# Patient Record
Sex: Male | Born: 1950 | Race: White | Hispanic: No | Marital: Married | Smoking: Former smoker
Health system: Southern US, Community
[De-identification: ages and names within clinical notes are randomized; demographics above are authoritative.]

## PROBLEM LIST (undated history)

## (undated) DIAGNOSIS — K219 Gastro-esophageal reflux disease without esophagitis: Secondary | ICD-10-CM

## (undated) DIAGNOSIS — Z8709 Personal history of other diseases of the respiratory system: Secondary | ICD-10-CM

## (undated) DIAGNOSIS — R0600 Dyspnea, unspecified: Secondary | ICD-10-CM

## (undated) DIAGNOSIS — F419 Anxiety disorder, unspecified: Secondary | ICD-10-CM

## (undated) DIAGNOSIS — T8859XA Other complications of anesthesia, initial encounter: Secondary | ICD-10-CM

## (undated) DIAGNOSIS — I10 Essential (primary) hypertension: Secondary | ICD-10-CM

## (undated) DIAGNOSIS — R519 Headache, unspecified: Secondary | ICD-10-CM

## (undated) DIAGNOSIS — F32A Depression, unspecified: Secondary | ICD-10-CM

## (undated) DIAGNOSIS — J45909 Unspecified asthma, uncomplicated: Secondary | ICD-10-CM

## (undated) DIAGNOSIS — Z952 Presence of prosthetic heart valve: Secondary | ICD-10-CM

## (undated) DIAGNOSIS — M797 Fibromyalgia: Secondary | ICD-10-CM

## (undated) DIAGNOSIS — D494 Neoplasm of unspecified behavior of bladder: Secondary | ICD-10-CM

## (undated) DIAGNOSIS — E039 Hypothyroidism, unspecified: Secondary | ICD-10-CM

## (undated) DIAGNOSIS — D4959 Neoplasm of unspecified behavior of other genitourinary organ: Secondary | ICD-10-CM

## (undated) DIAGNOSIS — Z8669 Personal history of other diseases of the nervous system and sense organs: Secondary | ICD-10-CM

## (undated) DIAGNOSIS — J189 Pneumonia, unspecified organism: Secondary | ICD-10-CM

## (undated) DIAGNOSIS — I480 Paroxysmal atrial fibrillation: Secondary | ICD-10-CM

## (undated) DIAGNOSIS — F329 Major depressive disorder, single episode, unspecified: Secondary | ICD-10-CM

## (undated) DIAGNOSIS — R51 Headache: Secondary | ICD-10-CM

## (undated) DIAGNOSIS — Z9989 Dependence on other enabling machines and devices: Secondary | ICD-10-CM

## (undated) DIAGNOSIS — I499 Cardiac arrhythmia, unspecified: Secondary | ICD-10-CM

## (undated) DIAGNOSIS — H53149 Visual discomfort, unspecified: Secondary | ICD-10-CM

## (undated) DIAGNOSIS — G4733 Obstructive sleep apnea (adult) (pediatric): Secondary | ICD-10-CM

## (undated) DIAGNOSIS — N400 Enlarged prostate without lower urinary tract symptoms: Secondary | ICD-10-CM

## (undated) DIAGNOSIS — J309 Allergic rhinitis, unspecified: Secondary | ICD-10-CM

## (undated) DIAGNOSIS — M1990 Unspecified osteoarthritis, unspecified site: Secondary | ICD-10-CM

## (undated) DIAGNOSIS — R5382 Chronic fatigue, unspecified: Secondary | ICD-10-CM

## (undated) DIAGNOSIS — I639 Cerebral infarction, unspecified: Secondary | ICD-10-CM

## (undated) HISTORY — DX: Presence of prosthetic heart valve: Z95.2

## (undated) HISTORY — DX: Essential (primary) hypertension: I10

## (undated) HISTORY — PX: COLONOSCOPY: SHX174

## (undated) HISTORY — PX: CARDIAC SURGERY: SHX584

## (undated) HISTORY — PX: WISDOM TOOTH EXTRACTION: SHX21

## (undated) SURGERY — Surgical Case
Anesthesia: *Unknown

---

## 1998-08-01 ENCOUNTER — Ambulatory Visit (HOSPITAL_COMMUNITY): Admission: RE | Admit: 1998-08-01 | Discharge: 1998-08-01 | Payer: Self-pay | Admitting: Specialist

## 1998-12-13 ENCOUNTER — Ambulatory Visit (HOSPITAL_COMMUNITY): Admission: RE | Admit: 1998-12-13 | Discharge: 1998-12-13 | Payer: Self-pay | Admitting: Family Medicine

## 1998-12-13 ENCOUNTER — Encounter: Payer: Self-pay | Admitting: Family Medicine

## 2000-03-06 ENCOUNTER — Encounter: Payer: Self-pay | Admitting: Chiropractic Medicine

## 2000-03-06 ENCOUNTER — Encounter: Admission: RE | Admit: 2000-03-06 | Discharge: 2000-03-06 | Payer: Self-pay | Admitting: Chiropractic Medicine

## 2001-01-01 ENCOUNTER — Encounter: Payer: Self-pay | Admitting: Family Medicine

## 2001-01-01 ENCOUNTER — Ambulatory Visit (HOSPITAL_COMMUNITY): Admission: RE | Admit: 2001-01-01 | Discharge: 2001-01-01 | Payer: Self-pay | Admitting: Family Medicine

## 2001-01-07 ENCOUNTER — Ambulatory Visit (HOSPITAL_COMMUNITY): Admission: RE | Admit: 2001-01-07 | Discharge: 2001-01-07 | Payer: Self-pay | Admitting: Gastroenterology

## 2001-01-12 ENCOUNTER — Encounter: Payer: Self-pay | Admitting: General Surgery

## 2001-01-14 ENCOUNTER — Encounter (INDEPENDENT_AMBULATORY_CARE_PROVIDER_SITE_OTHER): Payer: Self-pay | Admitting: Specialist

## 2001-01-14 ENCOUNTER — Encounter: Payer: Self-pay | Admitting: General Surgery

## 2001-01-14 ENCOUNTER — Observation Stay (HOSPITAL_COMMUNITY): Admission: RE | Admit: 2001-01-14 | Discharge: 2001-01-15 | Payer: Self-pay | Admitting: General Surgery

## 2001-01-14 HISTORY — PX: LAPAROSCOPIC CHOLECYSTECTOMY: SUR755

## 2001-04-27 ENCOUNTER — Ambulatory Visit (HOSPITAL_COMMUNITY): Admission: RE | Admit: 2001-04-27 | Discharge: 2001-04-27 | Payer: Self-pay | Admitting: Gastroenterology

## 2001-04-27 ENCOUNTER — Encounter: Payer: Self-pay | Admitting: Gastroenterology

## 2001-08-19 ENCOUNTER — Encounter: Payer: Self-pay | Admitting: Internal Medicine

## 2001-12-10 ENCOUNTER — Ambulatory Visit (HOSPITAL_COMMUNITY): Admission: RE | Admit: 2001-12-10 | Discharge: 2001-12-10 | Payer: Self-pay | Admitting: Gastroenterology

## 2001-12-10 ENCOUNTER — Encounter (INDEPENDENT_AMBULATORY_CARE_PROVIDER_SITE_OTHER): Payer: Self-pay

## 2002-02-25 ENCOUNTER — Ambulatory Visit (HOSPITAL_COMMUNITY): Admission: RE | Admit: 2002-02-25 | Discharge: 2002-02-25 | Payer: Self-pay | Admitting: Gastroenterology

## 2002-02-25 ENCOUNTER — Encounter: Payer: Self-pay | Admitting: Gastroenterology

## 2002-11-30 ENCOUNTER — Encounter: Payer: Self-pay | Admitting: Emergency Medicine

## 2002-11-30 ENCOUNTER — Emergency Department (HOSPITAL_COMMUNITY): Admission: EM | Admit: 2002-11-30 | Discharge: 2002-12-01 | Payer: Self-pay | Admitting: Emergency Medicine

## 2002-12-05 ENCOUNTER — Encounter: Payer: Self-pay | Admitting: Pulmonary Disease

## 2002-12-05 ENCOUNTER — Encounter (INDEPENDENT_AMBULATORY_CARE_PROVIDER_SITE_OTHER): Payer: Self-pay

## 2002-12-05 ENCOUNTER — Ambulatory Visit: Admission: RE | Admit: 2002-12-05 | Discharge: 2002-12-05 | Payer: Self-pay | Admitting: Pulmonary Disease

## 2003-03-24 ENCOUNTER — Ambulatory Visit (HOSPITAL_BASED_OUTPATIENT_CLINIC_OR_DEPARTMENT_OTHER): Admission: RE | Admit: 2003-03-24 | Discharge: 2003-03-24 | Payer: Self-pay | Admitting: Pulmonary Disease

## 2003-03-24 ENCOUNTER — Encounter: Payer: Self-pay | Admitting: Pulmonary Disease

## 2003-07-14 ENCOUNTER — Encounter: Payer: Self-pay | Admitting: Pulmonary Disease

## 2003-07-14 ENCOUNTER — Ambulatory Visit (HOSPITAL_BASED_OUTPATIENT_CLINIC_OR_DEPARTMENT_OTHER): Admission: RE | Admit: 2003-07-14 | Discharge: 2003-07-14 | Payer: Self-pay | Admitting: Pulmonary Disease

## 2004-10-24 ENCOUNTER — Ambulatory Visit: Payer: Self-pay | Admitting: Internal Medicine

## 2004-10-31 ENCOUNTER — Ambulatory Visit: Payer: Self-pay | Admitting: Internal Medicine

## 2004-11-14 ENCOUNTER — Ambulatory Visit: Payer: Self-pay | Admitting: Internal Medicine

## 2004-11-22 ENCOUNTER — Ambulatory Visit: Payer: Self-pay | Admitting: Internal Medicine

## 2004-11-26 ENCOUNTER — Ambulatory Visit: Payer: Self-pay | Admitting: Internal Medicine

## 2004-12-03 ENCOUNTER — Ambulatory Visit: Payer: Self-pay | Admitting: Internal Medicine

## 2004-12-18 ENCOUNTER — Ambulatory Visit: Payer: Self-pay | Admitting: Internal Medicine

## 2004-12-25 ENCOUNTER — Ambulatory Visit: Payer: Self-pay | Admitting: Internal Medicine

## 2005-01-02 ENCOUNTER — Ambulatory Visit: Payer: Self-pay | Admitting: Internal Medicine

## 2005-01-09 ENCOUNTER — Ambulatory Visit: Payer: Self-pay | Admitting: Internal Medicine

## 2005-01-23 ENCOUNTER — Ambulatory Visit: Payer: Self-pay | Admitting: Internal Medicine

## 2005-01-30 ENCOUNTER — Ambulatory Visit: Payer: Self-pay | Admitting: Internal Medicine

## 2005-02-13 ENCOUNTER — Ambulatory Visit: Payer: Self-pay | Admitting: Internal Medicine

## 2005-02-26 ENCOUNTER — Ambulatory Visit: Payer: Self-pay | Admitting: Internal Medicine

## 2005-03-20 ENCOUNTER — Ambulatory Visit: Payer: Self-pay | Admitting: Internal Medicine

## 2005-04-03 ENCOUNTER — Ambulatory Visit: Payer: Self-pay | Admitting: Internal Medicine

## 2005-04-10 ENCOUNTER — Ambulatory Visit: Payer: Self-pay | Admitting: Internal Medicine

## 2005-04-15 ENCOUNTER — Ambulatory Visit: Payer: Self-pay | Admitting: Internal Medicine

## 2005-04-28 ENCOUNTER — Ambulatory Visit: Payer: Self-pay | Admitting: Internal Medicine

## 2005-05-07 ENCOUNTER — Ambulatory Visit: Payer: Self-pay | Admitting: Internal Medicine

## 2005-05-15 ENCOUNTER — Ambulatory Visit: Payer: Self-pay | Admitting: Internal Medicine

## 2005-05-23 ENCOUNTER — Ambulatory Visit: Payer: Self-pay | Admitting: Internal Medicine

## 2005-05-28 ENCOUNTER — Ambulatory Visit: Payer: Self-pay | Admitting: Internal Medicine

## 2005-06-05 ENCOUNTER — Ambulatory Visit: Payer: Self-pay | Admitting: Internal Medicine

## 2005-06-24 ENCOUNTER — Ambulatory Visit: Payer: Self-pay | Admitting: Internal Medicine

## 2005-07-14 ENCOUNTER — Ambulatory Visit: Payer: Self-pay | Admitting: Internal Medicine

## 2005-07-29 ENCOUNTER — Ambulatory Visit: Payer: Self-pay | Admitting: Internal Medicine

## 2005-08-08 ENCOUNTER — Ambulatory Visit: Payer: Self-pay | Admitting: Internal Medicine

## 2005-08-11 ENCOUNTER — Ambulatory Visit: Payer: Self-pay | Admitting: Internal Medicine

## 2005-08-22 ENCOUNTER — Ambulatory Visit: Payer: Self-pay | Admitting: Internal Medicine

## 2005-09-16 ENCOUNTER — Ambulatory Visit: Payer: Self-pay | Admitting: Internal Medicine

## 2005-09-26 ENCOUNTER — Ambulatory Visit: Payer: Self-pay | Admitting: Internal Medicine

## 2005-11-03 ENCOUNTER — Ambulatory Visit: Payer: Self-pay | Admitting: Internal Medicine

## 2005-11-13 ENCOUNTER — Ambulatory Visit: Payer: Self-pay | Admitting: Internal Medicine

## 2005-11-27 ENCOUNTER — Ambulatory Visit: Payer: Self-pay | Admitting: Internal Medicine

## 2005-12-02 ENCOUNTER — Ambulatory Visit: Payer: Self-pay | Admitting: Internal Medicine

## 2005-12-12 ENCOUNTER — Ambulatory Visit: Payer: Self-pay | Admitting: Internal Medicine

## 2005-12-19 ENCOUNTER — Ambulatory Visit: Payer: Self-pay | Admitting: Internal Medicine

## 2005-12-25 ENCOUNTER — Ambulatory Visit: Payer: Self-pay | Admitting: Internal Medicine

## 2006-01-08 ENCOUNTER — Ambulatory Visit: Payer: Self-pay | Admitting: Internal Medicine

## 2006-01-20 ENCOUNTER — Ambulatory Visit: Payer: Self-pay | Admitting: Internal Medicine

## 2006-01-21 ENCOUNTER — Encounter: Admission: RE | Admit: 2006-01-21 | Discharge: 2006-01-21 | Payer: Self-pay | Admitting: Internal Medicine

## 2006-02-06 ENCOUNTER — Ambulatory Visit: Payer: Self-pay | Admitting: Internal Medicine

## 2006-02-23 ENCOUNTER — Ambulatory Visit: Payer: Self-pay | Admitting: Internal Medicine

## 2006-03-06 ENCOUNTER — Ambulatory Visit: Payer: Self-pay | Admitting: Internal Medicine

## 2006-03-11 ENCOUNTER — Ambulatory Visit: Payer: Self-pay | Admitting: Internal Medicine

## 2006-03-30 ENCOUNTER — Ambulatory Visit: Payer: Self-pay | Admitting: Internal Medicine

## 2006-04-14 ENCOUNTER — Ambulatory Visit: Payer: Self-pay | Admitting: Internal Medicine

## 2006-05-08 ENCOUNTER — Ambulatory Visit: Payer: Self-pay | Admitting: Internal Medicine

## 2006-06-02 ENCOUNTER — Ambulatory Visit: Payer: Self-pay | Admitting: Internal Medicine

## 2006-06-03 ENCOUNTER — Ambulatory Visit: Payer: Self-pay | Admitting: Internal Medicine

## 2006-06-11 ENCOUNTER — Ambulatory Visit: Payer: Self-pay | Admitting: Internal Medicine

## 2006-06-29 ENCOUNTER — Ambulatory Visit: Payer: Self-pay | Admitting: Internal Medicine

## 2006-07-15 ENCOUNTER — Ambulatory Visit: Payer: Self-pay | Admitting: Internal Medicine

## 2006-08-12 ENCOUNTER — Ambulatory Visit: Payer: Self-pay | Admitting: Internal Medicine

## 2006-08-24 ENCOUNTER — Ambulatory Visit: Payer: Self-pay | Admitting: Internal Medicine

## 2006-09-18 ENCOUNTER — Ambulatory Visit: Payer: Self-pay | Admitting: Internal Medicine

## 2006-09-23 ENCOUNTER — Ambulatory Visit: Payer: Self-pay | Admitting: Internal Medicine

## 2006-10-01 ENCOUNTER — Ambulatory Visit: Payer: Self-pay | Admitting: Internal Medicine

## 2006-10-12 ENCOUNTER — Ambulatory Visit: Payer: Self-pay | Admitting: Internal Medicine

## 2006-10-26 ENCOUNTER — Ambulatory Visit: Payer: Self-pay | Admitting: Internal Medicine

## 2006-11-02 ENCOUNTER — Ambulatory Visit: Payer: Self-pay | Admitting: Internal Medicine

## 2006-11-13 ENCOUNTER — Ambulatory Visit: Payer: Self-pay | Admitting: Internal Medicine

## 2006-11-18 ENCOUNTER — Ambulatory Visit: Payer: Self-pay | Admitting: Internal Medicine

## 2006-11-25 ENCOUNTER — Ambulatory Visit: Payer: Self-pay | Admitting: Internal Medicine

## 2006-12-08 ENCOUNTER — Ambulatory Visit: Payer: Self-pay | Admitting: Internal Medicine

## 2006-12-14 ENCOUNTER — Ambulatory Visit: Payer: Self-pay | Admitting: Internal Medicine

## 2006-12-15 ENCOUNTER — Ambulatory Visit: Payer: Self-pay | Admitting: Internal Medicine

## 2006-12-22 ENCOUNTER — Ambulatory Visit: Payer: Self-pay | Admitting: Internal Medicine

## 2007-01-13 ENCOUNTER — Ambulatory Visit: Payer: Self-pay | Admitting: Internal Medicine

## 2007-01-26 ENCOUNTER — Ambulatory Visit: Payer: Self-pay | Admitting: Internal Medicine

## 2007-02-01 ENCOUNTER — Ambulatory Visit: Payer: Self-pay | Admitting: Internal Medicine

## 2007-02-16 ENCOUNTER — Ambulatory Visit: Payer: Self-pay | Admitting: Internal Medicine

## 2007-02-22 ENCOUNTER — Ambulatory Visit: Payer: Self-pay | Admitting: Internal Medicine

## 2007-03-04 ENCOUNTER — Ambulatory Visit: Payer: Self-pay | Admitting: Internal Medicine

## 2007-03-18 ENCOUNTER — Ambulatory Visit: Payer: Self-pay | Admitting: Internal Medicine

## 2007-03-30 ENCOUNTER — Ambulatory Visit: Payer: Self-pay | Admitting: Internal Medicine

## 2007-05-21 ENCOUNTER — Ambulatory Visit: Payer: Self-pay | Admitting: Internal Medicine

## 2007-05-31 ENCOUNTER — Ambulatory Visit: Payer: Self-pay | Admitting: Internal Medicine

## 2007-06-11 ENCOUNTER — Ambulatory Visit: Payer: Self-pay | Admitting: Internal Medicine

## 2007-06-14 ENCOUNTER — Ambulatory Visit: Payer: Self-pay | Admitting: Internal Medicine

## 2007-07-12 ENCOUNTER — Ambulatory Visit: Payer: Self-pay | Admitting: Internal Medicine

## 2007-08-02 ENCOUNTER — Ambulatory Visit: Payer: Self-pay | Admitting: Internal Medicine

## 2007-08-10 ENCOUNTER — Ambulatory Visit: Payer: Self-pay | Admitting: Internal Medicine

## 2007-08-18 ENCOUNTER — Ambulatory Visit: Payer: Self-pay | Admitting: Internal Medicine

## 2007-09-28 ENCOUNTER — Ambulatory Visit: Payer: Self-pay | Admitting: Internal Medicine

## 2007-10-25 ENCOUNTER — Ambulatory Visit: Payer: Self-pay | Admitting: Internal Medicine

## 2007-11-05 ENCOUNTER — Ambulatory Visit: Payer: Self-pay | Admitting: Internal Medicine

## 2007-11-19 ENCOUNTER — Ambulatory Visit: Payer: Self-pay | Admitting: Internal Medicine

## 2007-11-22 ENCOUNTER — Ambulatory Visit: Payer: Self-pay | Admitting: Internal Medicine

## 2007-11-25 ENCOUNTER — Ambulatory Visit: Payer: Self-pay | Admitting: Internal Medicine

## 2007-12-06 ENCOUNTER — Ambulatory Visit: Payer: Self-pay | Admitting: Internal Medicine

## 2007-12-13 ENCOUNTER — Ambulatory Visit: Payer: Self-pay | Admitting: Internal Medicine

## 2007-12-21 ENCOUNTER — Ambulatory Visit: Payer: Self-pay | Admitting: Internal Medicine

## 2007-12-31 ENCOUNTER — Ambulatory Visit: Payer: Self-pay | Admitting: Internal Medicine

## 2008-01-10 ENCOUNTER — Ambulatory Visit: Payer: Self-pay | Admitting: Internal Medicine

## 2008-01-14 ENCOUNTER — Ambulatory Visit (HOSPITAL_COMMUNITY): Admission: RE | Admit: 2008-01-14 | Discharge: 2008-01-14 | Payer: Self-pay | Admitting: General Surgery

## 2008-01-14 HISTORY — PX: UMBILICAL HERNIA REPAIR: SHX196

## 2008-01-25 ENCOUNTER — Ambulatory Visit: Payer: Self-pay | Admitting: Internal Medicine

## 2008-02-03 ENCOUNTER — Ambulatory Visit: Payer: Self-pay | Admitting: Internal Medicine

## 2008-02-11 DIAGNOSIS — G4733 Obstructive sleep apnea (adult) (pediatric): Secondary | ICD-10-CM | POA: Insufficient documentation

## 2008-02-11 DIAGNOSIS — F3289 Other specified depressive episodes: Secondary | ICD-10-CM | POA: Insufficient documentation

## 2008-02-11 DIAGNOSIS — F329 Major depressive disorder, single episode, unspecified: Secondary | ICD-10-CM | POA: Insufficient documentation

## 2008-02-11 DIAGNOSIS — J42 Unspecified chronic bronchitis: Secondary | ICD-10-CM | POA: Insufficient documentation

## 2008-02-11 DIAGNOSIS — J3089 Other allergic rhinitis: Secondary | ICD-10-CM

## 2008-02-11 DIAGNOSIS — K219 Gastro-esophageal reflux disease without esophagitis: Secondary | ICD-10-CM | POA: Insufficient documentation

## 2008-02-11 DIAGNOSIS — J302 Other seasonal allergic rhinitis: Secondary | ICD-10-CM | POA: Insufficient documentation

## 2008-02-14 ENCOUNTER — Ambulatory Visit: Payer: Self-pay | Admitting: Internal Medicine

## 2008-03-02 ENCOUNTER — Ambulatory Visit: Payer: Self-pay | Admitting: Internal Medicine

## 2008-03-06 ENCOUNTER — Ambulatory Visit: Payer: Self-pay | Admitting: Internal Medicine

## 2008-03-22 ENCOUNTER — Ambulatory Visit: Payer: Self-pay | Admitting: Internal Medicine

## 2008-05-01 ENCOUNTER — Ambulatory Visit: Payer: Self-pay | Admitting: Internal Medicine

## 2008-05-08 ENCOUNTER — Ambulatory Visit: Payer: Self-pay | Admitting: Internal Medicine

## 2008-06-12 ENCOUNTER — Ambulatory Visit: Payer: Self-pay | Admitting: Internal Medicine

## 2008-06-27 ENCOUNTER — Ambulatory Visit: Payer: Self-pay | Admitting: Internal Medicine

## 2008-07-13 ENCOUNTER — Ambulatory Visit: Payer: Self-pay | Admitting: Internal Medicine

## 2008-07-14 ENCOUNTER — Ambulatory Visit: Payer: Self-pay | Admitting: Internal Medicine

## 2008-07-20 ENCOUNTER — Ambulatory Visit: Payer: Self-pay | Admitting: Internal Medicine

## 2008-07-28 ENCOUNTER — Ambulatory Visit: Payer: Self-pay | Admitting: Internal Medicine

## 2008-08-08 ENCOUNTER — Ambulatory Visit: Payer: Self-pay | Admitting: Internal Medicine

## 2008-09-12 ENCOUNTER — Ambulatory Visit: Payer: Self-pay | Admitting: Internal Medicine

## 2008-09-25 ENCOUNTER — Ambulatory Visit: Payer: Self-pay | Admitting: Internal Medicine

## 2008-10-11 ENCOUNTER — Ambulatory Visit: Payer: Self-pay | Admitting: Internal Medicine

## 2008-10-16 ENCOUNTER — Ambulatory Visit: Payer: Self-pay | Admitting: Internal Medicine

## 2008-10-25 ENCOUNTER — Ambulatory Visit: Payer: Self-pay | Admitting: Internal Medicine

## 2008-11-01 ENCOUNTER — Ambulatory Visit: Payer: Self-pay | Admitting: Internal Medicine

## 2008-11-10 ENCOUNTER — Ambulatory Visit: Payer: Self-pay | Admitting: Internal Medicine

## 2008-11-20 ENCOUNTER — Ambulatory Visit: Payer: Self-pay | Admitting: Internal Medicine

## 2008-11-30 ENCOUNTER — Ambulatory Visit: Payer: Self-pay | Admitting: Internal Medicine

## 2008-12-07 ENCOUNTER — Ambulatory Visit: Payer: Self-pay | Admitting: Internal Medicine

## 2008-12-12 ENCOUNTER — Ambulatory Visit: Payer: Self-pay | Admitting: Internal Medicine

## 2008-12-26 ENCOUNTER — Ambulatory Visit: Payer: Self-pay | Admitting: Internal Medicine

## 2009-01-02 ENCOUNTER — Ambulatory Visit: Payer: Self-pay | Admitting: Internal Medicine

## 2009-01-15 ENCOUNTER — Ambulatory Visit: Payer: Self-pay | Admitting: Internal Medicine

## 2009-01-23 ENCOUNTER — Ambulatory Visit: Payer: Self-pay | Admitting: Internal Medicine

## 2009-02-08 ENCOUNTER — Ambulatory Visit: Payer: Self-pay | Admitting: Internal Medicine

## 2009-02-09 ENCOUNTER — Ambulatory Visit: Payer: Self-pay | Admitting: Internal Medicine

## 2009-02-13 ENCOUNTER — Ambulatory Visit: Payer: Self-pay | Admitting: Internal Medicine

## 2009-02-19 ENCOUNTER — Ambulatory Visit: Payer: Self-pay | Admitting: Internal Medicine

## 2009-03-01 ENCOUNTER — Ambulatory Visit: Payer: Self-pay | Admitting: Internal Medicine

## 2009-03-06 ENCOUNTER — Ambulatory Visit: Payer: Self-pay | Admitting: Internal Medicine

## 2009-03-26 ENCOUNTER — Ambulatory Visit: Payer: Self-pay | Admitting: Internal Medicine

## 2009-04-03 ENCOUNTER — Ambulatory Visit: Payer: Self-pay | Admitting: Internal Medicine

## 2009-04-12 ENCOUNTER — Ambulatory Visit: Payer: Self-pay | Admitting: Internal Medicine

## 2009-05-01 ENCOUNTER — Ambulatory Visit: Payer: Self-pay | Admitting: Internal Medicine

## 2009-05-09 ENCOUNTER — Ambulatory Visit: Payer: Self-pay | Admitting: Internal Medicine

## 2009-05-15 ENCOUNTER — Ambulatory Visit: Payer: Self-pay | Admitting: Internal Medicine

## 2009-05-29 ENCOUNTER — Ambulatory Visit: Payer: Self-pay | Admitting: Internal Medicine

## 2009-06-06 ENCOUNTER — Ambulatory Visit: Payer: Self-pay | Admitting: Internal Medicine

## 2009-06-18 ENCOUNTER — Ambulatory Visit: Payer: Self-pay | Admitting: Internal Medicine

## 2009-07-06 ENCOUNTER — Ambulatory Visit: Payer: Self-pay | Admitting: Internal Medicine

## 2009-07-10 ENCOUNTER — Ambulatory Visit: Payer: Self-pay | Admitting: Internal Medicine

## 2009-07-16 ENCOUNTER — Ambulatory Visit: Payer: Self-pay | Admitting: Internal Medicine

## 2009-07-25 ENCOUNTER — Ambulatory Visit: Payer: Self-pay | Admitting: Internal Medicine

## 2009-07-31 ENCOUNTER — Ambulatory Visit: Payer: Self-pay | Admitting: Internal Medicine

## 2009-08-01 ENCOUNTER — Ambulatory Visit: Payer: Self-pay | Admitting: Internal Medicine

## 2009-08-07 ENCOUNTER — Ambulatory Visit: Payer: Self-pay | Admitting: Internal Medicine

## 2009-08-21 ENCOUNTER — Ambulatory Visit: Payer: Self-pay | Admitting: Internal Medicine

## 2009-09-05 ENCOUNTER — Ambulatory Visit: Payer: Self-pay | Admitting: Internal Medicine

## 2009-09-23 ENCOUNTER — Encounter: Payer: Self-pay | Admitting: Internal Medicine

## 2009-09-26 ENCOUNTER — Ambulatory Visit: Payer: Self-pay | Admitting: Internal Medicine

## 2009-10-03 ENCOUNTER — Ambulatory Visit: Payer: Self-pay | Admitting: Internal Medicine

## 2009-10-16 ENCOUNTER — Ambulatory Visit: Payer: Self-pay | Admitting: Internal Medicine

## 2009-11-02 ENCOUNTER — Encounter: Payer: Self-pay | Admitting: Internal Medicine

## 2009-11-11 ENCOUNTER — Telehealth: Payer: Self-pay | Admitting: Internal Medicine

## 2009-11-12 ENCOUNTER — Ambulatory Visit: Payer: Self-pay | Admitting: Internal Medicine

## 2009-11-22 ENCOUNTER — Ambulatory Visit: Payer: Self-pay | Admitting: Internal Medicine

## 2009-11-29 ENCOUNTER — Ambulatory Visit: Payer: Self-pay | Admitting: Internal Medicine

## 2009-12-06 ENCOUNTER — Encounter: Payer: Self-pay | Admitting: Internal Medicine

## 2009-12-18 ENCOUNTER — Ambulatory Visit: Payer: Self-pay | Admitting: Internal Medicine

## 2010-01-03 ENCOUNTER — Ambulatory Visit: Payer: Self-pay | Admitting: Internal Medicine

## 2010-01-10 ENCOUNTER — Ambulatory Visit: Payer: Self-pay | Admitting: Internal Medicine

## 2010-01-18 ENCOUNTER — Ambulatory Visit: Payer: Self-pay | Admitting: Internal Medicine

## 2010-01-24 ENCOUNTER — Ambulatory Visit: Payer: Self-pay | Admitting: Internal Medicine

## 2010-01-31 ENCOUNTER — Ambulatory Visit: Payer: Self-pay | Admitting: Internal Medicine

## 2010-02-27 ENCOUNTER — Ambulatory Visit: Payer: Self-pay | Admitting: Internal Medicine

## 2010-03-06 ENCOUNTER — Ambulatory Visit: Payer: Self-pay | Admitting: Internal Medicine

## 2010-03-12 ENCOUNTER — Ambulatory Visit: Payer: Self-pay | Admitting: Internal Medicine

## 2010-03-13 ENCOUNTER — Ambulatory Visit: Payer: Self-pay | Admitting: Internal Medicine

## 2010-04-10 ENCOUNTER — Ambulatory Visit: Payer: Self-pay | Admitting: Internal Medicine

## 2010-04-30 ENCOUNTER — Ambulatory Visit: Payer: Self-pay | Admitting: Internal Medicine

## 2010-05-23 ENCOUNTER — Ambulatory Visit: Payer: Self-pay | Admitting: Internal Medicine

## 2010-05-28 ENCOUNTER — Encounter: Payer: Self-pay | Admitting: Internal Medicine

## 2010-06-06 ENCOUNTER — Ambulatory Visit: Payer: Self-pay | Admitting: Internal Medicine

## 2010-06-07 ENCOUNTER — Ambulatory Visit: Payer: Self-pay | Admitting: Internal Medicine

## 2010-06-13 ENCOUNTER — Encounter: Payer: Self-pay | Admitting: Internal Medicine

## 2010-06-14 DIAGNOSIS — R55 Syncope and collapse: Secondary | ICD-10-CM | POA: Insufficient documentation

## 2010-06-17 ENCOUNTER — Ambulatory Visit: Payer: Self-pay | Admitting: Internal Medicine

## 2010-06-17 DIAGNOSIS — I10 Essential (primary) hypertension: Secondary | ICD-10-CM | POA: Insufficient documentation

## 2010-06-27 ENCOUNTER — Encounter: Payer: Self-pay | Admitting: Internal Medicine

## 2010-07-02 ENCOUNTER — Ambulatory Visit: Payer: Self-pay | Admitting: Internal Medicine

## 2010-07-11 ENCOUNTER — Telehealth (INDEPENDENT_AMBULATORY_CARE_PROVIDER_SITE_OTHER): Payer: Self-pay | Admitting: *Deleted

## 2010-07-15 ENCOUNTER — Encounter (HOSPITAL_COMMUNITY): Admission: RE | Admit: 2010-07-15 | Discharge: 2010-09-23 | Payer: Self-pay | Admitting: Internal Medicine

## 2010-07-15 ENCOUNTER — Ambulatory Visit: Payer: Self-pay

## 2010-07-15 ENCOUNTER — Encounter: Payer: Self-pay | Admitting: Internal Medicine

## 2010-07-15 ENCOUNTER — Ambulatory Visit: Payer: Self-pay | Admitting: Internal Medicine

## 2010-07-23 ENCOUNTER — Ambulatory Visit: Payer: Self-pay | Admitting: Internal Medicine

## 2010-08-02 ENCOUNTER — Ambulatory Visit: Payer: Self-pay | Admitting: Internal Medicine

## 2010-08-07 ENCOUNTER — Telehealth: Payer: Self-pay | Admitting: Internal Medicine

## 2010-08-09 ENCOUNTER — Ambulatory Visit: Payer: Self-pay | Admitting: Internal Medicine

## 2010-08-13 ENCOUNTER — Ambulatory Visit: Payer: Self-pay | Admitting: Internal Medicine

## 2010-08-26 ENCOUNTER — Ambulatory Visit: Payer: Self-pay | Admitting: Internal Medicine

## 2010-09-05 ENCOUNTER — Ambulatory Visit: Payer: Self-pay | Admitting: Internal Medicine

## 2010-11-12 ENCOUNTER — Telehealth (INDEPENDENT_AMBULATORY_CARE_PROVIDER_SITE_OTHER): Payer: Self-pay | Admitting: *Deleted

## 2010-11-14 ENCOUNTER — Ambulatory Visit: Payer: Self-pay | Admitting: Internal Medicine

## 2010-11-17 ENCOUNTER — Ambulatory Visit: Payer: Self-pay | Admitting: Internal Medicine

## 2010-11-21 NOTE — Progress Notes (Signed)
Summary: would like results of stress test  Phone Note Call from Patient Call back at [phone]   Caller: Spouse / Olegario Messier Reason for Call: Talk to Nurse, Talk to Doctor Summary of Call: they would like stress test results and pls don't call between 11a - 1pm  Initial call taken by: Omer Jack,  August 07, 2010 8:53 AM  Follow-up for Phone Call        lmom can call back tomorrow

## 2010-11-21 NOTE — Assessment & Plan Note (Signed)
Summary: rov/ mbw   Primary Provider/Referring Provider:  Brynda Greathouse [name]/ [name]   History of Present Illness: 02/14/08- He returns with his wife for 6 month follow-up.  His allergic rhinitis and asthma have been adequately controlled.  He continues allergy vaccine here.  He sometimes misses for a week or two when they are out of town, but we have been able to adjust.  There are no reactions.  He continues his Advair discus, and Nasonex as noted on his medication list.  Sometimes he feels a throat tickle and he says his rescue inhaler does help.  He also uses cough syrup when needed.  There is been no significant postnasal drainage, but he has occasional wheezing.  He does notice reflux, and I suggested he try adding an over-the-counter acid blocker to his one Nexium a day. Sleep apnea control is satisfactory.  Uses CPAP most nights for at least some of each night, probably more than 4 hours per night on average.  He has a humidifier.  Sometimes he thinks the CPAP makes him cough and we discussed this.  I can't tell any pattern from his description.  07/06/09- Allergic rhinitis, asthma/  bronchitis, GERD, OSA....................Marland Kitchenwife here He likes CPAP, still at 10, and gets supplies on line. He adjusts it a little when he has a cold. Uses nasal pillows. Continues allergy vaccine here without problems, Advair, using rescue inhaler about once daily at wife's discretion, daily loratadine. They recognize easier breathing at beach.  September 05, 2010- Allergic rhinitis, asthma/  bronchitis, GERD, OSA....................Marland Kitchenwife here Continues allergy vaccine here at 1:10. They feel he is doing about the same. He can't tell the shots or Advair  have helped, so he would like to stop. Uses rescue inhaler infrequently if exposed to dust or etc. Only notes a little wheeze very occasionally. Declines flu vax- per Dr Revonda Humphrey advice.   Uses CPAP fine, all night every night at 12 cwp.    Preventive  Screening-Counseling & Management  Alcohol-Tobacco     Smoking Status: quit     Year Quit: 25 years +- ago  Current Medications (verified): 1)  Arimidex 1 Mg  Tabs (Anastrozole) .... Take 1 Tablet By Mouth Once A Week 2)  Effexor Xr 150 Mg  Cp24 (Venlafaxine Hcl) .... Take 1 Tablet By Mouth Once A Day 3)  Perphenazine 2 Mg  Tabs (Perphenazine) .... As Needed 4)  Advair Diskus 100-50 Mcg/dose  Misc (Fluticasone-Salmeterol) .... Use As Directed As Needed 5)  Proventil Hfa 108 (90 Base) Mcg/act  Aers (Albuterol Sulfate) .... As Needed 6)  Multivitamins   Caps (Multiple Vitamin) .... Take 1 Tablet By Mouth Once A Day 7)  Cpap 12 Cwp  Sms .... Use As Directed 8)  Cpap Mask of Choice and Supplies 9)  Hyzaar 50-12.5 Mg  Tabs (Losartan Potassium-Hctz) .... Take 1 By Mouth Once Daily 10)  Allergy Vaccine Gh  1:10 11)  Alprazolam 0.5 Mg Tabs (Alprazolam) .Marland Kitchen.. 1 Once Daily 12)  Levothyroxine Sodium 100 Mcg Tabs (Levothyroxine Sodium) .Marland Kitchen.. 1 Once Daily 13)  Replacement Cpap Machine and Supplies .Marland Kitchen.. 10 Cwp 14)  Allegra-D 24 Hour 180-240 Mg Xr24h-Tab (Fexofenadine-Pseudoephedrine) .... 1. Once Daily 15)  Synthroid 112 Mcg Tabs (Levothyroxine Sodium) .... Once Daily 16)  Cytomel 25 Mcg Tabs (Liothyronine Sodium) 17)  Benzonatate 100 Mg Caps (Benzonatate) .Marland Kitchen.. 1-2 Capsule, Every 3-4 Hours 18)  Vitamin D 2000 Unit Tabs (Cholecalciferol) .... Once Daily 19)  Vitamin C-Rose [place]ips 1000 Mg Tabs (Ascorbic Acid) .Marland KitchenMarland KitchenMarland Kitchen  Once Daily 20)  Cyanocobalamin 1000 Mcg/ml Soln (Cyanocobalamin) .... Once Daily 21)  Omeprazole 40 Mg Cpdr (Omeprazole) .... Take 1 By Mouth Two Times A Day 22)  Align 4 Mg Caps (Probiotic Product) .... Take 1 By Mouth Once Daily  Allergies (verified): 1)  ! * Tussinex 2)  ! Codeine  Past History:  Past Medical History:  SYNCOPE (ICD-780.2) DEPRESSION (ICD-311) SLEEP APNEA, OBSTRUCTIVE (ICD-327.23) REFLUX ESOPHAGITIS, HX OF (ICD-V12.79) ALLERGIC RHINITIS (ICD-477.9) BRONCHITIS,  RECURRENT (ICD-491.9)    Review of Systems      See HPI       The patient complains of nasal congestion/difficulty breathing through nose.  The patient denies shortness of breath with activity, shortness of breath at rest, productive cough, non-productive cough, coughing up blood, chest pain, irregular heartbeats, acid heartburn, indigestion, loss of appetite, weight change, abdominal pain, difficulty swallowing, sore throat, tooth/dental problems, headaches, sneezing, itching, ear ache, rash, change in color of mucus, and fever.    Vital Signs:  Patient profile:   60 year old male Height:      71 inches Weight:      262.25 pounds BMI:     36.71 O2 Sat:      96 % on Room air Pulse rate:   105 / minute BP sitting:   116 / 82  (left arm) Cuff size:   large  Vitals Entered By: Reynaldo Minium CMA (September 05, 2010 2:24 PM)  O2 Flow:  Room air   Physical Exam  Additional Exam:  General: A/Ox3; pleasant and cooperative, NAD, calm SKIN: no rash, lesions NODES: no lymphadenopathy HEENT: [place]/AT, EOM- WNL, Conjuctivae- clear, PERRLA, TM-WNL, Nose- clear, watery, Throat- clear and wnl, Mallampati II-III NECK: Supple w/ fair ROM, JVD- none, normal carotid impulses w/o bruits Thyroid-  CHEST: Clear to P&A HEART: RRR, no m/g/r heard ABDOMEN: Soft and nl;  ZOX:WRUE, nl pulses, no edema  NEURO: Grossly intact to observation. Sunglasses, scanning "Ray Leonette Most" type looking around room.      Impression & Recommendations:  Problem # 1:  SLEEP APNEA, OBSTRUCTIVE (ICD-327.23)  Good compliance and control, By their description he is using CPAP reliably and it is well tolerated and effecvtive.  Problem # 2:  ALLERGIC RHINITIS (ICD-477.9)  He can't tell that shots are making a difference so we will stop and watch.  Problem # 3:  BRONCHITIS, RECURRENT (ICD-491.9)  He can't tell benefit of Advair. we discussedc this, and compared rescue with maintenance inhalers. he will stop Advair and  restart if needed.   Orders: Est. Patient Level IV (45409)  Medications Added to Medication List This Visit: 1)  Omeprazole 40 Mg Cpdr (Omeprazole) .... Take 1 by mouth two times a day 2)  Align 4 Mg Caps (Probiotic product) .... Take 1 by mouth once daily  Patient Instructions: 1)  Please schedule a follow-up appointment in 6 months. 2)  OK to stop allergy vaccine 3)  Ok to stop Advair- yoiu can restart it at any point, or talk with [place] about alternatives, as needed.

## 2010-11-21 NOTE — Assessment & Plan Note (Signed)
Summary: rov/apc   Primary Provider/Referring Provider:  Brynda Greathouse [name]/ [name]  CC:  Follow up no c/o compliant with cpap averages 7-8 hrs per night.  History of Present Illness: 02/14/08- He returns with his wife for 6 month follow-up.  His allergic rhinitis and asthma have been adequately controlled.  He continues allergy vaccine here.  He sometimes misses for a week or two when they are out of town, but we have been able to adjust.  There are no reactions.  He continues his Advair discus, and Nasonex as noted on his medication list.  Sometimes he feels a throat tickle and he says his rescue inhaler does help.  He also uses cough syrup when needed.  There is been no significant postnasal drainage, but he has occasional wheezing.  He does notice reflux, and I suggested he try adding an over-the-counter acid blocker to his one Nexium a day. Sleep apnea control is satisfactory.  Uses CPAP most nights for at least some of each night, probably more than 4 hours per night on average.  He has a humidifier.  Sometimes he thinks the CPAP makes him cough and we discussed this.  I can't tell any pattern from his description.  2009-07-26- Allergic rhinitis, asthma/  bronchitis, GERD, OSA....................Marland Kitchenwife here He likes CPAP, still at 10, and gets supplies on line. He adjusts it a little when he has a cold. Uses nasal pillows. Continues allergy vaccine here without problems, Advair, using rescue inhaler about once daily at wife's discretion, daily loratadine. They recognize easier breathing at beach.     Preventive Screening-Counseling & Management  Alcohol-Tobacco     Smoking Status: quit  Current Medications (verified): 1)  Nexium 40 Mg  Cpdr (Esomeprazole Magnesium) .... Take 1 Tablet By Mouth Once A Day 2)  Arimidex 1 Mg  Tabs (Anastrozole) .... Take 1 Tablet By Mouth Once A Week 3)  Effexor Xr 150 Mg  Cp24 (Venlafaxine Hcl) .... Take 1 Tablet By Mouth Once A Day 4)  Perphenazine 2  Mg  Tabs (Perphenazine) .... As Needed 5)  Advair Diskus 100-50 Mcg/dose  Misc (Fluticasone-Salmeterol) .... Use As Directed As Needed 6)  Proventil Hfa 108 (90 Base) Mcg/act  Aers (Albuterol Sulfate) .... As Needed 7)  Androgel Pump 1 %  Gel (Testosterone) .... Use As Directed 8)  Multivitamins   Caps (Multiple Vitamin) .... Take 1 Tablet By Mouth Once A Day 9)  Coq10 30 Mg  Caps (Coenzyme Q10) .... Take 1 Tablet By Mouth Once A Day 10)  Cpap 10 Cwp .... Use As Directed 11)  Hyzaar 50-12.5 Mg  Tabs (Losartan Potassium-Hctz) .... Take 1 By Mouth Once Daily 12)  Allergy Vaccine Gh  1:10 13)  Alprazolam 0.5 Mg Tabs (Alprazolam) .Marland Kitchen.. 1 Once Daily 14)  Levothyroxine Sodium 100 Mcg Tabs (Levothyroxine Sodium) .Marland Kitchen.. 1 Once Daily 15)  Liothyronine Sodium 25 Mcg Tabs (Liothyronine Sodium) .Marland Kitchen.. 1 Once Daily  Allergies (verified): 1)  ! * Tussinex 2)  ! Codeine  Past History:  Past Medical History: Last updated: 02/14/2008 DEPRESSION (ICD-311) SLEEP APNEA, OBSTRUCTIVE (ICD-327.23) REFLUX ESOPHAGITIS, HX OF (ICD-V12.79) ALLERGIC RHINITIS (ICD-477.9) BRONCHITIS, RECURRENT (ICD-491.9)  Family History: Last updated: Jul 26, 2009 Mother- died aortic aneurysm, family hx heart disease Father- MVA Brother- died age 49, MI  Social History: Last updated: July 26, 2009 Married Patient states former smoker.  They own peanut selling business  Risk Factors: Smoking Status: quit (Jul 26, 2009)  Past Surgical History: Cholecystectomy hernia repair  Family History: Mother- died aortic aneurysm, family  hx heart disease Father- MVA Brother- died age 32, MI  Social History: Married Patient states former smoker.  They own peanut selling business Smoking Status:  quit  Review of Systems      See HPI       The patient complains of nasal congestion/difficulty breathing through nose and sneezing.  The patient denies shortness of breath with activity, shortness of breath at rest, productive cough,  non-productive cough, coughing up blood, chest pain, irregular heartbeats, acid heartburn, indigestion, loss of appetite, weight change, abdominal pain, difficulty swallowing, sore throat, tooth/dental problems, and headaches.    Vital Signs:  Patient profile:   60 year old male Height:      71 inches Weight:      256 pounds BMI:     35.83 O2 Sat:      98 % on Room air Pulse rate:   109 / minute BP sitting:   130 / 86  (left arm)  Vitals Entered By: Renold Genta RCP, LPN (July 06, 2009 10:44 AM)  O2 Sat at Rest %:  96 O2 Flow:  Room air CC: Follow up no c/o compliant with cpap averages 7-8 hrs per night Comments Medications reviewed with patient Renold Genta RCP, LPN  July 06, 2009 10:43 AM    Physical Exam  Additional Exam:  General: A/Ox3; pleasant and cooperative, NAD, calm/ pleasant affect SKIN: no rash, lesions NODES: no lymphadenopathy HEENT: [place]/AT, EOM- WNL, Conjuctivae- clear, PERRLA, TM-WNL, Nose- clear, Throat- clear and wnl, [place] II-III NECK: Supple w/ fair ROM, JVD- none, normal carotid impulses w/o bruits Thyroid-  CHEST: Clear to P&A HEART: RRR, no m/g/r heard ABDOMEN: Soft and nl;  WUJ:WJXB, nl pulses, no edema  NEURO: Grossly intact to observation      Impression & Recommendations:  Problem # 1:  SLEEP APNEA, OBSTRUCTIVE (ICD-327.23)  Great compliance and control. Machine is over 4 years old. We discussed script for replacement when needed.  Problem # 2:  ALLERGIC RHINITIS (ICD-477.9)  We continue allergy vaccine. The following medications were removed from the medication list:    Nasonex 50 Mcg/act Susp (Mometasone furoate) ..... Use as directed  Medications Added to Medication List This Visit: 1)  Arimidex 1 Mg Tabs (Anastrozole) .... Take 1 tablet by mouth once a week 2)  Cpap Mask of Choice and Supplies  3)  Alprazolam 0.5 Mg Tabs (Alprazolam) .Marland Kitchen.. 1 once daily 4)  Levothyroxine Sodium 100 Mcg Tabs (Levothyroxine sodium)  .Marland Kitchen.. 1 once daily 5)  Liothyronine Sodium 25 Mcg Tabs (Liothyronine sodium) .Marland Kitchen.. 1 once daily 6)  Replacement Cpap Machine and Supplies  .Marland Kitchen.. 10 cwp  Other Orders: Est. Patient Level III (14782) DME Referral (DME)  Patient Instructions: 1)  Schedule return in 1 year unless needed sooner 2)  See [facility] to make contact with home care company for cpap Prescriptions: REPLACEMENT CPAP MACHINE AND SUPPLIES 10 cwp  #1 x prn   Entered and Authorized by:   Waymon Budge MD   Signed by:   Waymon Budge MD on 07/06/2009   Method used:   Print then Give to Patient   RxID:   [id number]

## 2010-11-21 NOTE — Assessment & Plan Note (Signed)
Summary: Cardiology Nuclear Testing  Nuclear Med Background Indications for Stress Test: Evaluation for Ischemia   History: Asthma, COPD, GXT  History Comments: '93 GXT NL OSA  Symptoms: Chest Pain, Chest Pain with Exertion, Chest Tightness, Chest Tightness with Exertion, Diaphoresis, Dizziness, Fatigue, Fatigue with Exertion, Near Syncope, Palpitations, SOB  Symptoms Comments: last CP this am.   Nuclear Pre-Procedure Cardiac Risk Factors: Family History - CAD, History of Smoking, Hypertension Caffeine/Decaff Intake: none NPO After: 12:00 PM Lungs: Clear IV 0.9% NS with Angio Cath: 22g     IV Site: R Hand IV Started by: Cathlyn Parsons, RN Chest Size (in) 52     Height (in): 71 Weight (lb): 256 BMI: 35.83 Tech Comments: NPO 12pm 07/14/10. The patient took his albuterol inhaler 2 hrs prior lexiscan.  Nuclear Med Study 1 or 2 day study:  1 day     Stress Test Type:  Eugenie Birks Reading MD:  Dietrich Pates, MD     Referring MD:  G.Taylor Resting Radionuclide:  Technetium 62m Tetrofosmin     Resting Radionuclide Dose:  11 mCi  Stress Radionuclide:  Technetium 72m Tetrofosmin     Stress Radionuclide Dose:  33 mCi   Stress Protocol      Max HR:  115 bpm     Predicted Max HR:  163 bpm  Max Systolic BP: 133 mm Hg     Percent Max HR:  70.55 %Rate Pressure Product:  29528  Lexiscan: 0.4 mg   Stress Test Technologist:  Irean Hong,  RN     Nuclear Technologist:  Domenic Polite, CNMT  Rest Procedure  Myocardial perfusion imaging was performed at rest 45 minutes following the intravenous administration of Technetium 25m Tetrofosmin.  Stress Procedure  The patient received IV Lexiscan 0.4 mg over 15-seconds.  Technetium 74m Tetrofosmin injected at 30-seconds.  There were no significant changes with lexiscan.  Quantitative spect images were obtained after a 45 minute delay.  QPS Raw Data Images:  Images wer motion corrected.  Soft tissue (diaphragm, bowel activity) underlie  heart. Stress Images:  Mild thinning in the inferior wall (base, mid) and apex.  Oterwise normal perfusion. Rest Images:  Comparison with the stress images reveals no significant change. Subtraction (SDS):  No evidence of ischemia. Transient Ischemic Dilatation:  1.12  (Normal <1.22)  Lung/Heart Ratio:  .31  (Normal <0.45)  Quantitative Gated Spect Images QGS EDV:  165 ml QGS ESV:  78 ml QGS EF:  53 % QGS cine images:  Mild hypokinesis basal inferior wall.   Overall Impression  Exercise Capacity: Lexiscan with no exercise. BP Response: Normal blood pressure response. Clinical Symptoms: No chest pain ECG Impression: No significant ST segment change suggestive of ischemia. Overall Impression: Inferior thinning consistent with mild scar and or soft tissue attenuation in the inferior  (base).  Otherwise normal perfusion.  No ischemia.

## 2010-11-21 NOTE — Progress Notes (Signed)
Summary: CPAP autotitration to 12.  Phone Note Other Incoming   Summary of Call: CPAP autotitration 09/29/09- 11/02/08. Good control on 12 cwp. We will change listed pressure to 12. Good compliance.   Initial call taken by: Waymon Budge MD,  November 11, 2009 5:29 PM  Follow-up for Phone Call        Called pt and is aware that cpap will be set on a fixed pressure of 12. Advised to call us once this pressure is set if he has any problems wearing cpap. Pt's caregiver and poa voiced understanding. Rhonda Cobb  November 13, 2009 8:56 AM     New/Updated Medications: * CPAP 12 CWP  SMS use as directed

## 2010-11-21 NOTE — Miscellaneous (Signed)
Summary: Injection Record / [facility]    Injection Record / [facility]    Imported By: Lennie Odor 06/21/2010 11:03:34  _____________________________________________________________________  External Attachment:    Type:   Image     Comment:   External Document

## 2010-11-21 NOTE — Miscellaneous (Signed)
Summary: Injection record  Injection record   Imported By: Lester [place] 11/01/2010 12:29:32  _____________________________________________________________________  External Attachment:    Type:   Image     Comment:   External Document

## 2010-11-21 NOTE — Miscellaneous (Signed)
Summary: [facility] Power of [facility] Power of Attorney   Imported By: Roderic Ovens 07/12/2010 16:13:47  _____________________________________________________________________  External Attachment:    Type:   Image     Comment:   External Document

## 2010-11-21 NOTE — Assessment & Plan Note (Signed)
Summary: FU 6 MONTHS///KWP   Visit Type:  Follow-up  Chief Complaint:  6 month follow up.  History of Present Illness: Current Problems:  DEPRESSION (ICD-311) SLEEP APNEA, OBSTRUCTIVE (ICD-327.23) REFLUX ESOPHAGITIS, HX OF (ICD-V12.79) ALLERGIC RHINITIS (ICD-477.9) BRONCHITIS, RECURRENT (ICD-491.9)  He returns with his wife for 6 month follow-up.  His allergic rhinitis and asthma have been adequately controlled.  He continues allergy vaccine here.  He sometimes misses for a week or two when they are out of town, but we have been able to adjust.  There are no reactions.  He continues his Advair discus, and Nasonex as noted on his medication list.  Sometimes he feels a throat tickle and he says his rescue inhaler does help.  He also uses cough syrup when needed.  There is been no significant postnasal drainage, but he has occasional wheezing.  He does notice reflux, and I suggested he try adding an over-the-counter acid blocker to his one Nexium a day. Sleep apnea control is satisfactory.  Uses CPAP most nights for at least some of each night, probably more than 4 hours per night on average.  He has a humidifier.  Sometimes he thinks the CPAP makes him cough and we discussed this.  I can't tell any pattern from his description.         Current Allergies (reviewed today): ! * TUSSINEX ! CODEINE  Past Medical History:    Reviewed history and no changes required:       DEPRESSION (ICD-311)       SLEEP APNEA, OBSTRUCTIVE (ICD-327.23)       REFLUX ESOPHAGITIS, HX OF (ICD-V12.79)       ALLERGIC RHINITIS (ICD-477.9)       BRONCHITIS, RECURRENT (ICD-491.9)            Review of Systems      See HPI   Vital Signs:  Patient Profile:   60 Years Old Male Weight:      294.25 pounds O2 Sat:      96 % O2 treatment:    Room Air Pulse rate:   101 / minute BP sitting:   124 / 82  (left arm) Cuff size:   regular  Vitals Entered By: Reynaldo Minium CMA (February 14, 2008 9:02 AM)            Comments Medications reviewed with patient  ..................................................................Marland KitchenReynaldo Minium CMA  February 14, 2008 9:04 AM      Physical Exam  General:     Unusual affect.  Wearing sunglasses.Jiggling right leg throughout the discussion obese.   Eyes:     sunglasses Nose:     no deformity, discharge, inflammation, or lesions Mouth:     no deformity or lesions, mucosa is red with no visible drainage or exudates.  Voice quality is normal. Neck:     no JVD.   Lungs:     lung fields are clear to percussion and auscultation.  Work of breathing is not increased.  He is not coughing. Heart:     regular rate and rhythm, S1, S2 without murmurs, rubs, gallops, or clicks     Impression & Recommendations:  Problem # 1:  SLEEP APNEA, OBSTRUCTIVE (ICD-327.23) sleep apnea control seems adequate.  He would be better off if he could lose more weight and this was discussed.  He needs to make a little more effort at CPAP compliance.  I'm not sure why he thinks the mask sometimes makes him cough and sometimes doesn't.  He knows  how to adjust his humidifier.  He will continue CPAP 10 CWP. Orders: Est. Patient Level III (16109)   Problem # 2:  ALLERGIC RHINITIS (ICD-477.9) he will continue allergy vaccine. I will see if there is room to go up on his vaccines strength. Supplemental antihistamines as needed. His updated medication list for this problem includes:    Nasonex 50 Mcg/act Susp (Mometasone furoate) ..... Use as directed  Orders: Est. Patient Level III (60454)   Problem # 3:  BRONCHITIS, RECURRENT (ICD-491.9) he will continue his bronchodilator regimen for asthmatic bronchitis.  Problem # 4:  REFLUX ESOPHAGITIS, HX OF (ICD-V12.79) reflux precautions, and additional acid blocker therapy were discussed. Orders: Est. Patient Level III (09811)   Medications Added to Medication List This Visit: 1)  Hyzaar 50-12.5 Mg Tabs (Losartan potassium-hctz)  .... Take 1 by mouth once daily 2)  Allergy Vaccine Gh    Patient Instructions: 1)  Please schedule a follow-up appointment in 6 months. 2)  I will check on the dose of your allergy shots and see about increasing 3)  Try taking an extra acid blocker by adding an over the counter med like prilosec or pepcid    ]

## 2010-11-21 NOTE — Assessment & Plan Note (Signed)
Summary: cardiac arrhythmia/palpitations/mt   Visit Type:  new pt  Primary Grant [name]:  Grant [name]  CC:  shortness of breath and tired.  History of Present Illness: Grant [name] is referred today by Dr. Alessandra [name] for evaluation of near syncopal spells.  The patient has a multitude of medical problems including sleep apnea, bronchitis and toxic encephalopathy and HTN.  He has had recurrent episodes of unexplained diaphoresis and sob.  He has generalized fatigue.  His syncopal history is a little unclear to me though he has had documented bradycardia in Dr. Martyn [name] office as measured by recording the peripheral pulse.  He does not have chest pain.  Current Medications (verified): 1)  Arimidex 1 Mg  Tabs (Anastrozole) .... Take 1 Tablet By Mouth Once A Week 2)  Effexor Xr 150 Mg  Cp24 (Venlafaxine Hcl) .... Take 1 Tablet By Mouth Once A Day 3)  Perphenazine 2 Mg  Tabs (Perphenazine) .... As Needed 4)  Advair Diskus 100-50 Mcg/dose  Misc (Fluticasone-Salmeterol) .... Use As Directed As Needed 5)  Proventil Hfa 108 (90 Base) Mcg/act  Aers (Albuterol Sulfate) .... As Needed 6)  Multivitamins   Caps (Multiple Vitamin) .... Take 1 Tablet By Mouth Once A Day 7)  Cpap 12 Cwp  Sms .... Use As Directed 8)  Cpap Mask of Choice and Supplies 9)  Hyzaar 50-12.5 Mg  Tabs (Losartan Potassium-Hctz) .... Take 1 By Mouth Once Daily 10)  Allergy Vaccine Gh  1:10 11)  Alprazolam 0.5 Mg Tabs (Alprazolam) .Marland Kitchen.. 1 Once Daily 12)  Levothyroxine Sodium 100 Mcg Tabs (Levothyroxine Sodium) .Marland Kitchen.. 1 Once Daily 13)  Replacement Cpap Machine and Supplies .Marland Kitchen.. 10 Cwp 14)  Allegra-D 24 Hour 180-240 Mg Xr24h-Tab (Fexofenadine-Pseudoephedrine) .... 1. Once Daily 15)  Synthroid 112 Mcg Tabs (Levothyroxine Sodium) .... Once Daily 16)  Cytomel 25 Mcg Tabs (Liothyronine Sodium) 17)  Doxycycline Hyclate 100 Mg Solr (Doxycycline Hyclate) .Marland Kitchen.. 1cap Two Times A Day 18)  Benzonatate 100 Mg Caps (Benzonatate) .Marland Kitchen.. 1-2  Capsule, Every 3-4 Hours 19)  Vitamin D 2000 Unit Tabs (Cholecalciferol) .... Once Daily 20)  Vitamin C-Rose Hips 1000 Mg Tabs (Ascorbic Acid) .... Once Daily 21)  Cyanocobalamin 1000 Mcg/ml Soln (Cyanocobalamin) .... Once Daily  Allergies (verified): 1)  ! * Tussinex 2)  ! Codeine  Past History:  Past Medical History: Last updated: 06/14/2010 Current Problems:  SYNCOPE (ICD-780.2) DEPRESSION (ICD-311) SLEEP APNEA, OBSTRUCTIVE (ICD-327.23) REFLUX ESOPHAGITIS, HX OF (ICD-V12.79) ALLERGIC RHINITIS (ICD-477.9) BRONCHITIS, RECURRENT (ICD-491.9)    Past Surgical History: Last updated: Jul 27, 2009 Cholecystectomy hernia repair  Family History: Last updated: Jul 27, 2009 Mother- died aortic aneurysm, family hx heart disease Father- MVA Brother- died age 43, MI  Social History: Last updated: 07-27-09 Married Patient states former smoker.  They own peanut selling business  Review of Systems  The patient denies chest pain, syncope, dyspnea on exertion, and peripheral edema.    Vital Signs:  Patient profile:   60 year old male Height:      71 inches Weight:      256.25 pounds BMI:     35.87 Pulse (ortho):   103 / minute BP sitting:   140 / 84  (left arm) Cuff size:   regular  Physical Exam  General:  Unusual affect.  Wearing sunglasses.Jiggling right leg throughout the discussion obese.   Eyes:  sunglasses Mouth:  no deformity or lesions, mucosa is red with no visible drainage or exudates.  Voice quality is normal. Neck:  no JVD.   Lungs:  lung fields are clear to percussion and auscultation.  Work of breathing is not increased.  He is not coughing. Heart:  regular rate and rhythm, S1, S2 without murmurs, rubs, gallops, or clicks Msk:  Back normal, normal gait. Muscle strength and tone normal. Pulses:  pulses normal in all 4 extremities Extremities:  No clubbing or cyanosis. Neurologic:  Affect is very flat.  Moves all extremities well.   EKG  Procedure date:   06/17/2010  Findings:      Sinus tachycardia with rate of:  103.  Impression & Recommendations:  Problem # 1:  SYNCOPE (ICD-780.2) The patient's 48 hour holter is unremarkable for any bradycardia. He could have autonomic dysfunction.  He did have occaisional PAC's and PVC's.  With the patient's cardiac risk factors, I have recommended he undergo Adenosine myoview stress test which will allow [place] to evaluate for occult ischemia and also evaluate his LV function. I will see him back after the stress test. Orders: Nuclear Stress Test (Nuc Stress Test)  Problem # 2:  ESSENTIAL HYPERTENSION, BENIGN (ICD-401.1) His blood pressure is elevated.  He will continue his current meds.  A low sodium diet is recommended. His updated medication list for this problem includes:    Hyzaar 50-12.5 Mg Tabs (Losartan potassium-hctz) .Marland Kitchen... Take 1 by mouth once daily  Other Orders: EKG w/ Interpretation (93000)  Patient Instructions: 1)  Your physician recommends that you schedule a follow-up appointment in: as needed  after lexiscan myoview 2)  Your physician recommends that you continue on your current medications as directed. Please refer to the Current Medication list given to you today. 3)  Your physician has requested that you have an lexiscan myoview.  For further information please visit [email or web address]  Please follow instruction sheet, as given.

## 2010-11-21 NOTE — Miscellaneous (Signed)
Summary: Injection Record/[facility]  Injection Record/[facility]   Imported By: Sherian Rein 03/12/2010 14:10:30  _____________________________________________________________________  External Attachment:    Type:   Image     Comment:   External Document

## 2010-11-21 NOTE — Letter (Signed)
Summary: [name] Progress Note  [name] Progress Note   Imported By: Roderic Ovens 07/22/2010 11:55:49  _____________________________________________________________________  External Attachment:    Type:   Image     Comment:   External Document

## 2010-11-21 NOTE — Progress Notes (Signed)
Summary: wants to start back on allergy injections  Phone Note Call from Patient   Caller: SPOUSE/KATHY Call For: DR YOUNG Summary of Call: Patients wife Lynden Ang phoned stated they had discussed with Dr Maple Hudson about going off of his allergy shots and Dr. Maple Hudson told him that was fine if he wanted to try and go off of them. He had his last allegry shot in December. But he is having problems and wants to go back on them. She states that Dr Maple Hudson advised them to call if he decided to go back on them and if we still had the medicine that would be okay.  They can be reached at [phone] Initial call taken by: Vedia Coffer,  November 12, 2010 3:54 PM  Follow-up for Phone Call        called and spoke with pt's wife, Olegario Messier.  Olegario Messier states when pt last saw CY 09/05/2010, CY ok'd for pt to stop allergy vaccines and advair, which pt did.  Olegario Messier states pt now c/o allergy symptoms and would like to restart the vaccines.  pt c/o itchy and watery eyes, nasal congestion, increased sob and coughing up clear sputum.  Pt does not wish to restart the advair and would like to just continue use proventil as needed.  Please advise if ok to restart vaccines.  Thanks.  Aundra Millet Reynolds LPN  November 12, 2010 4:11 PM   allergies: tussionex and codeine  Additional Follow-up for Phone Call Additional follow up Details #1::        OK to resume allergy vaccine 1:10 Rebuild from 0.1 ml/ vial/ week to 0.5 ml/ vial/ week.  I will pass this to [facility] and ask the allergy lab to contact him to discuss resuming his shots here. Additional Follow-up by: Waymon Budge MD,  November 12, 2010 5:04 PM    New/Updated Medications: * ALLERGY VACCINE 1:10  GH Restart after stopping 09/2010

## 2010-11-21 NOTE — Progress Notes (Signed)
Summary: Nuclear Pre-Procedure  Phone Note Outgoing Call   Call placed by: Milana Na, EMT-P,  July 11, 2010 3:53 PM Summary of Call: Reviewed information on Myoview Information Sheet (see scanned document for further details).  Spoke with patient's wife.     Nuclear Med Background Indications for Stress Test: Evaluation for Ischemia   History: GXT  History Comments: '93 GXT NL OSA  Symptoms: Diaphoresis, Fatigue, Near Syncope, SOB    Nuclear Pre-Procedure Cardiac Risk Factors: Family History - CAD, History of Smoking, Hypertension Height (in): 71  Nuclear Med Study Referring MD:  G.Ladona Ridgel

## 2010-11-26 DIAGNOSIS — J301 Allergic rhinitis due to pollen: Secondary | ICD-10-CM

## 2010-12-13 ENCOUNTER — Ambulatory Visit (INDEPENDENT_AMBULATORY_CARE_PROVIDER_SITE_OTHER): Payer: Medicare Other

## 2010-12-13 DIAGNOSIS — J301 Allergic rhinitis due to pollen: Secondary | ICD-10-CM

## 2010-12-17 ENCOUNTER — Telehealth (INDEPENDENT_AMBULATORY_CARE_PROVIDER_SITE_OTHER): Payer: Self-pay | Admitting: *Deleted

## 2010-12-26 NOTE — Progress Notes (Signed)
Summary: needs to know name and number of cpap supplier  Phone Note Call from Patient   Caller: Spouse/kathy Call For: YOUNG Summary of Call: patients wife Olegario Messier called, patient used to go thru [facility] but the last time he was seen they switched to a different company and the machine doesnt have  a name or number on it. She needs to know the company so she can order some head gear. She can be reached at [phone] Initial call taken by: Vedia Coffer,  December 17, 2010 12:02 PM  Follow-up for Phone Call        [facility] -- ? if this is [facility].  looks like last cpap order sent was 10/2009 to [facility] Gweneth Dimitri RN  December 17, 2010 2:32 PM   Additional Follow-up for Phone Call Additional follow up Details #1::        Spoke with pt's spouse and advised that we sent order to [facility] last time and gave her their number per her request.  Additional Follow-up by: Vernie Murders,  December 17, 2010 2:52 PM

## 2011-01-01 ENCOUNTER — Encounter: Payer: Self-pay | Admitting: Internal Medicine

## 2011-01-01 ENCOUNTER — Ambulatory Visit (INDEPENDENT_AMBULATORY_CARE_PROVIDER_SITE_OTHER): Payer: Medicare Other

## 2011-01-01 DIAGNOSIS — J301 Allergic rhinitis due to pollen: Secondary | ICD-10-CM | POA: Insufficient documentation

## 2011-01-07 NOTE — Assessment & Plan Note (Signed)
Summary: allergy/cb  Nurse Visit   Allergies: 1)  ! * Tussinex 2)  ! Codeine  Orders Added: 1)  Allergy Injection (1) [57322]

## 2011-01-08 ENCOUNTER — Ambulatory Visit (INDEPENDENT_AMBULATORY_CARE_PROVIDER_SITE_OTHER): Payer: Medicare Other

## 2011-01-08 DIAGNOSIS — J301 Allergic rhinitis due to pollen: Secondary | ICD-10-CM

## 2011-01-10 ENCOUNTER — Telehealth: Payer: Self-pay | Admitting: Internal Medicine

## 2011-01-10 ENCOUNTER — Encounter: Payer: Self-pay | Admitting: Internal Medicine

## 2011-01-10 DIAGNOSIS — J42 Unspecified chronic bronchitis: Secondary | ICD-10-CM

## 2011-01-10 MED ORDER — FLUTICASONE PROPIONATE 50 MCG/ACT NA SUSP: 2.0000 | Freq: Every day | NASAL | Status: DC

## 2011-01-10 MED ORDER — ALBUTEROL SULFATE HFA 108 (90 BASE) MCG/ACT IN AERS: 2.0000 | INHALATION_SPRAY | Freq: Four times a day (QID) | RESPIRATORY_TRACT | Status: DC | PRN

## 2011-01-10 NOTE — Telephone Encounter (Signed)
Ok to refill Proair, # 3, ref x 3   2 puffs, 4 x daily if needed  Ok to Rx generic Flonase,  # 3, ref x 3    1-2 puffs each nostril, once daily at bedtime  Thank you!

## 2011-01-10 NOTE — Telephone Encounter (Signed)
The patient c/o sneezing and stuffy nose and is requesting a prescription for generic flonase or nasonex. Also requesting refill on Proair. All prescriptions need to go to [facility]. Per med list in [facility], the patient is currently on Proair, receiving allergy injections and taking Allegra 180mg  daily. Pls advise.Michel Bickers, MA  ALLERGIES: Tussinex and Codeine

## 2011-01-10 NOTE — Telephone Encounter (Signed)
Prescriptions sent. Pt aware. Jennifer Castillo, CMA   

## 2011-01-21 ENCOUNTER — Ambulatory Visit (INDEPENDENT_AMBULATORY_CARE_PROVIDER_SITE_OTHER): Payer: Medicare Other

## 2011-01-21 DIAGNOSIS — J301 Allergic rhinitis due to pollen: Secondary | ICD-10-CM

## 2011-02-04 ENCOUNTER — Ambulatory Visit (INDEPENDENT_AMBULATORY_CARE_PROVIDER_SITE_OTHER): Payer: Medicare Other

## 2011-02-04 DIAGNOSIS — J309 Allergic rhinitis, unspecified: Secondary | ICD-10-CM

## 2011-02-14 ENCOUNTER — Ambulatory Visit (INDEPENDENT_AMBULATORY_CARE_PROVIDER_SITE_OTHER): Payer: Medicare Other

## 2011-02-14 DIAGNOSIS — J309 Allergic rhinitis, unspecified: Secondary | ICD-10-CM

## 2011-02-24 ENCOUNTER — Ambulatory Visit (INDEPENDENT_AMBULATORY_CARE_PROVIDER_SITE_OTHER): Payer: Medicare Other

## 2011-02-24 DIAGNOSIS — J309 Allergic rhinitis, unspecified: Secondary | ICD-10-CM

## 2011-03-04 NOTE — Assessment & Plan Note (Signed)
[facility]                             PULMONARY OFFICE NOTE   Grant [name], Grant [name]                     MRN:          [id number]  DATE:08/18/2007                            DOB:          04/11/1951    PROBLEMS:  1. Recurrent bronchitis.  2. Allergic rhinitis.  3. Esophageal reflux.  4. Obstructive sleep apnea.  5. Depression.   HISTORY:  He comes for scheduled follow up stating that he is doing  okay. His wife emphasizes that he gets to coughing and takes his CPAP  off. He feels comfortable with the pressure still at 10 CWP. Cough is  generally non-productive. He is not really aware of reflux or post-nasal  drainage. He continues allergy vaccine at 1 to 50, getting injections  here with no problems. His medication list is reviewed and updated. He  looks to Dr. [name] for decisions about his flu vaccine.   OBJECTIVE:  Weight 252 pounds, blood pressure 132/70, pulse 100, room  air saturation 97%. Pharynx is distinctly red. Voice quality is normal  with no strider. No adenopathy found. No post-nasal drainage. Lung  fields are clear. Heart sounds are regular without murmur.   IMPRESSION:  Obstructive sleep apnea seems adequately controlled at 10  CWP. The problem is not with the CPAP, but with his cough. I strongly  suspect reflux as the basis for that since it happens mostly at night  and is associated with this red throat.   PLAN:  Reflux precautions discussed. Elevate head of bed on a brick.  Increase Nexium 40 mg to b.i.d. Discussed further management or referral  with Dr. [name]. Schedule return 3 months, earlier p.r.n.     Clinton D. Maple Hudson, MD, FCCP, FACP     CDY/MedQ  DD: 08/21/2007  DT: 08/23/2007  Job #: [id number]   cc:   [name]

## 2011-03-04 NOTE — Op Note (Signed)
NAME:  NEAL, TRULSON NO.:  [id number]   MEDICAL RECORD NO.:  [id number]          PATIENT TYPE:  AMB   LOCATION:  DAY                          FACILITY:  [facility]   PHYSICIAN:  Sharlet Salina T. Hoxworth, M.D.DATE OF BIRTH:  Apr 08, 1951   DATE OF PROCEDURE:  01/14/2008  DATE OF DISCHARGE:                               OPERATIVE REPORT   PREOPERATIVE DIAGNOSIS:  Umbilical hernia.   POSTOPERATIVE DIAGNOSIS:  Umbilical hernia.   SURGICAL PROCEDURES:  Repair of umbilical hernia with mesh.   SURGEON:  Lorne Skeens. Hoxworth, M.D.   ANESTHESIA:  General.   BRIEF HISTORY:  Mr. Lall is a 60 year old male who presents with a  tender, reducible about 1-1.5-cm hernia at the umbilicus.  He has a  previous laparoscopic gallbladder incision here as well.  I have  recommended repair as an outpatient using mesh under general anesthesia.  The nature of the procedure, indications, risks of bleeding, infection  and recurrence were discussed and understood.  He is now brought to the  operating room for this procedure.   DESCRIPTION OF OPERATION:  The patient was brought to the operating room  and placed in supine position on the operating table and general  endotracheal anesthesia was induced.  The abdomen was sterilely prepped  and draped.  He received preoperative antibiotics.  Correct patient and  procedure were verified.  I made a curvilinear incision just beneath the  umbilicus and dissection was carried down to the subcutaneous tissue.  The umbilical skin was dissected up off the hernia defect.  There was  herniated preperitoneal fat through about a 1.5-cm defect.  The  preperitoneal fat was excised.  The peritoneum was opened under direct  vision.  There was no evidence of any adhesions around the fascia in any  direction for several centimeters.  An Ethicon ventral patch 6.5 cm in  diameter was then used, coiling this, inserting it intraperitoneally,  and then it deployed  nicely, flat in all directions.  Following this the  fascia of the defect was closed with interrupted 0 Prolene,  incorporating the tails of the mesh into the repair.  The soft tissue  was infiltrated with Marcaine.  The umbilical skin was tacked back down  at the fascial level with interrupted Monocryl and skin was closed with  running subcuticular Monocryl and Dermabond.  Sponges, instrument and  needle counts correct.  The patient taken to the recovery room in good  condition.      Lorne Skeens. Hoxworth, M.D.  Electronically Signed     BTH/MEDQ  D:  01/14/2008  T:  01/15/2008  Job:  [id number]

## 2011-03-04 NOTE — Assessment & Plan Note (Signed)
[facility]                             PULMONARY OFFICE NOTE   [name], Grant                     MRN:          [id number]  DATE:08/18/2007                            DOB:          17-Jul-1951    PROBLEMS:  1. Recurrent bronchitis.  2. Allergic rhinitis.  3. Esophageal reflux.  4. Obstructive sleep apnea.  5. Depression.   HISTORY:  He comes for scheduled follow up stating that he is doing  okay. His wife emphasizes that he gets to coughing and takes his CPAP  off. He feels comfortable with the pressure still at 10 CWP. Cough is  generally non-productive. He is not really aware of reflux or post-nasal  drainage. He continues allergy vaccine at 1 to 50, getting injections  here with no problems. His medication list is reviewed and updated. He  looks to Dr. [name] for decisions about his flu vaccine.   OBJECTIVE:  Weight 252 pounds, blood pressure 132/70, pulse 100, room  air saturation 97%. Pharynx is distinctly red. Voice quality is normal  with no strider. No adenopathy found. No post-nasal drainage. Lung  fields are clear. Heart sounds are regular without murmur.   IMPRESSION:  Obstructive sleep apnea seems adequately controlled at 10  CWP. The problem is not with the CPAP, but with his cough. I strongly  suspect reflux as the basis for that since it happens mostly at night  and is associated with this red throat.   PLAN:  Reflux precautions discussed. Elevate head of bed on a brick.  Increase Nexium 40 mg to b.i.d. Discussed further management or referral  with Dr. [name]. Schedule return 3 months, earlier p.r.n.     Clinton D. Maple Hudson, MD, Tonny Bollman, FACP  Electronically Signed    CDY/MedQ  DD: 08/21/2007  DT: 08/23/2007  Job #: [id number]   cc:   [name]

## 2011-03-07 NOTE — Op Note (Signed)
[facility]  Patient:    Grant [name], Grant [name]                     MRN: [id number] Proc. Date: 01/14/01 Adm. Date:  [id number] Disc. Date: [id number] Attending:  Glenna Fellows Tappan                           Operative Report  PREOPERATIVE DIAGNOSIS:  Cholelithiasis.  POSTOPERATIVE DIAGNOSIS:  Cholelithiasis.  SURGICAL PROCEDURE:  Laparoscopic cholecystectomy with intraoperative cholangiogram.  SURGEON:  Sharlet Salina T. Hoxworth, M.D.  ASSISTANTRiley Lam A. Magnus Ivan, M.D.  ANESTHESIA:  General.  BRIEF HISTORY:  The patient is a 60 year old white male, who presents with repeated episodes of epigastric abdominal pain.  Gallbladder ultrasound has revealed multiple gallstones and a normal common bile duct.  LFTs are normal. A laparoscopic cholecystectomy with cholangiogram has been recommended and accepted.  The nature of the procedure, its indications, and risks of bleeding, infection, bowel or bladder injury were discussed and understood. He is now brought to the operating room for this procedure.  DESCRIPTION OF PROCEDURE:  The patient was brought to the operating room, placed in the supine position on the operating table and general endotracheal anesthesia was induced.  PAS were in place.  He received antibiotics preoperatively.  The abdomen was sterilely prepped and draped.  Local anesthesia was used to infiltrate the trocar sites prior to the incisions.  A 1 cm incision was made at the umbilicus and dissection carried down to the midline fascia.  This was sharply incised for 1 cm and the peritoneum entered under direct vision.  Through a mattress suture of 0 Vicryl, the Hasson trocar was placed and pneumoperitoneum established.  Under direct vision, a 10 mm trocar was placed in the subxiphoid area, and two 5 mm trocars along the right subcostal margin.  The gallbladder was visualized and the dome grasped and elevated up over the liver.  Some  fibrofatty tissue adhesions were stripped down off the neck of the gallbladder, and the infundibulum grasped and retracted inferolaterally.  Further fibrofatty tissue was stripped down toward the porta hepatis and the distal gallbladder thoroughly dissected.  The cystic duct gallbladder junction was identified and dissected 360 degrees.  The Calot triangle was dissected completely, and anterior branch of the cystic artery identified coursing up onto the gallbladder wall and was divided between clips.  Operative cholangiogram was then obtained through the cystic duct which revealed normal sized intrahepatic and common bile and common hepatic ducts with free flow into the duodenum and no filling defects.  Following this, the Cholangiocath was removed, and the cystic duct doubly clipped proximally and divided.  The gallbladder was then dissected free from its bed using hook and spatula cautery.  Posterior branch of the cystic artery was controlled with clips.  The gallbladder was withdrawn through the umbilicus. The right upper quadrant was thoroughly irrigated with saline and complete hemostasis assured.  Trocars removed under direct vision.  All CO2 evacuated from the peritoneal cavity. Pursestring suture was secured at the umbilicus. Skin incisions were closed with subcuticular 4-0 Monocryl and Dermabond. Sponge, needle and instrument counts were correct.  Dry sterile dressing was applied, and the patient was taken to the recovery in good condition. DD:  01/14/01 TD:  01/15/01 Job: [id number]

## 2011-03-07 NOTE — Procedures (Signed)
[facility]  Patient:    Grant [name], Grant [name]                     MRN: [id number] Proc. Date: 01/07/01 Adm. Date:  [id number] Attending:  Nelda Marseille CC:         Arvella Merles, M.D.  Lorne Skeens. Hoxworth, M.D.   Procedure Report  PROCEDURE:  Esophagogastroduodenoscopy.  INDICATIONS FOR PROCEDURE:  Upper tract symptoms possibly due to gallstones. Want to rule out any other etiology before surgery.  Consent was signed after risks, benefits, methods, and options were thoroughly discussed with he and his wife.  MEDICINES USED:  Demerol 70, Versed 7.  DESCRIPTION OF PROCEDURE:  The video endoscope was inserted by direct vision. The esophagus was normal. The scope passed into the stomach and advanced through an antrum pertinent for some minimal antritis into a duodenal bulb pertinent for some minimal bulbitis and around the C loop to a negative normal second portion of the duodenum. The scope was withdrawn back to the bulb and a good look there ruled out abnormalities in that location except for the minimal bulbitis. The scope was withdrawn back to the stomach and retroflexed. The cardia, fundus, angularis, lesser and greater curve were normal on retroflexed visualization. The scope was straightened and straight visualization of the stomach was normal except for some minimal gastritis. Air was suctioned and the scope slowly withdrawn. Again a good look at the esophagus on slow withdrawal was normal. The scope was removed. The patient tolerated the procedure well and there was no obvious or immediate complication.  ENDOSCOPIC DIAGNOSIS: 1. Minimal gastritis, antritis, bulbitis. 2. Otherwise normal esophagogastroduodenoscopy.  PLAN:  Surgical options per Dr. Johna Sheriff regarding gallstones. Be happy to see back p.r.n. otherwise return care to Dr. Tiburcio Pea for the customary health care screening. DD:  01/07/01 TD:  01/08/01 Job:  [id number]

## 2011-03-07 NOTE — Assessment & Plan Note (Signed)
[facility]                             PULMONARY OFFICE NOTE   Grant [name], Grant [name]                     MRN:          [id number]  DATE:12/22/2006                            DOB:          October 24, 1950    PROBLEMS:  1. Recurrent bronchitis.  2. Allergic rhinitis.  3. Esophageal reflux.  4. Obstructive sleep apnea.  5. Depression.   HISTORY:  He and his wife return for 31-month followup, now reporting  that they are working with Dr. [name] for problems of fibromyalgia and  chronic fatigue.  They also still work with Dr. Tiburcio Pea.  CPAP is doing  well at 10 CWP and his wife says he wears it through most nights and  sleeps quietly with it.  He says once in a while it will start him  coughing for no clear reason and he will take it off and leave it off  the rest of the night.  They changed home care companies to [facility] and  he likes the current nasal pillows mask he is using.  He has been on  Allegra-D taking it b.i.d. and says it is well tolerated.  He has a  metered inhaler, not used very often.  They say he clearly breathes  better when he is at the beach and they expect to be gone for a week or  two this summer.  We talked about management of his allergy vaccine  during vacations.  He gets his injections here and has had no problems.  Currently, he feels stable.   MEDICATION:  1. Armour Thyroid 120 mg.  2. Norvasc 5 mg.  3. Benicar 40 mg.  4. Nexium 40 mg.  5. Arimidex 1 mg.  6. Effexor XR 150 mg.  7. Allegra-D b.i.d. most days.  8. Perphenazine 2 mg p.r.n.  9. Advair 100/50.  10.Proventil HFA inhaler p.r.n.  11.He is taking minocycline 100 mg two daily on Monday, Wednesday,      Friday for 3 weeks and then off a week.  12.Tindamax 500 mg twice a day on Wednesday, Thursday, and Friday for      3 weeks, then off for a week.  13.AndroGel 1%.  14.Zithromax is used two times per day on Monday, Wednesday, Friday      for 3 weeks and off for a  week.  15.Nasonex nasal spray.  16.Multivitamins.  17.CoQ10.  18.Gingko.  19.D-ribose.  20.CPAP is set at 10 CWP.   I did not spot the antibiotics on his medication list until now as I  dictate and I am not clear what he was taking those for.   OBJECTIVE:  Weight 244 pounds, BP 122/80, pulse regular at 102, room air  saturation 96%.  Dark glasses with little eye contact.  His wife does  most of the talking.  I do not see a rash or find obvious adenopathy.  His chest sounds clear.  Heart sounds are regular without murmur.  Posterior pharynx is somewhat cobblestoned without visible drainage.  Voice quality is normal.  Negligible cough noted once.  No edema.  There  are no  pressure marks from his CPAP mask on his face.   IMPRESSION:  1. Obstructive sleep apnea, adequately on CPAP at 10 CWP.  2. Rhinitis with daily use of Allegra-D.  3. Esophageal reflux.  4. Multifactorial cough, better at the beach, suggesting an air-      quality effect.   PLAN:  He will continue his steroid nasal spray refilled when needed  based on discussion with his wife.  Continue other treatments as above.  Schedule return in 6 months, earlier p.r.n.     Clinton D. Maple Hudson, MD, Tonny Bollman, FACP  Electronically Signed    CDY/MedQ  DD: 12/22/2006  DT: 12/23/2006  Job #: [id number]   cc:   Melida Quitter, M.D.  [name]

## 2011-03-07 NOTE — Assessment & Plan Note (Signed)
[facility]                               PULMONARY OFFICE NOTE   CHANG, TIGGS                     MRN:          [id number]  DATE:06/29/2006                            DOB:          [birth year]    PROBLEMS:  1. Recurrent bronchitis.  2. Allergic rhinitis.  3. Esophageal reflux.  4. Obstructive sleep apnea.  5. Depression.   HISTORY:  I last saw this gentleman with his wife 1-1/2 years ago.  Complaint of chronic fatigue remains an issue.  He does not remember that  Vivactil as a non-sedating antidepressant had any effect, but that was a  long time ago.  He has not used CPAP in the past six months, saying that  while it had been quite satisfactory for a long time, it gradually started  making him cough.  They would like to change home care providers.  He is not  able to say what it is about CPAP that made him cough, and I think the issue  is mainly that they want to work with somebody they know and find easier to  get in touch with.  He has had some chronic cough but says it has been much  better this year.  He had Astelin nasal spray but left it at the beach and  has not been using anything for mild nasal congestion.  He continues allergy  vaccine at 1 to 10 based on skin testing in 2005, and he gets his injections  here with no problems.  CPAP has been on 10 CWP.   OBJECTIVE:  VITAL SIGNS:  Weight 247 pounds compared with 239 pounds when he  was here before.  GENERAL:  Rather laid-back gentleman.  His wife speaks up to ask questions  and make points.  He is moderately overweight.  HEENT:  Speech is clear.  There is no stridor or thyromegaly.  His pharynx  is somewhat reddened with polypoid prominence on the posterior pharyngeal  wall, suggesting reflux.  Nasal airway is clear.  LUNGS:  Lung fields are clear.   IMPRESSION:  1. Obstructive sleep apnea had been well-controlled on CPAP.  I am not      sure what has made the difference,  but we will help them change to home      care company of choice and reevaluate.  2. He needs more aggressive reflux precautions, and I have asked him to      double up his Nexium to twice a day for a while or add Pepcid as a      second-dose agent.  3. We refilled Nasonex to use 2 sprays each nostril daily.  4. Weight loss.  5. Schedule return in six months, earlier p.r.n.                                   Clinton D. Maple Hudson, MD, FCCP, FACP   CDY/MedQ  DD:  06/29/2006  DT:  06/30/2006  Job #:  [id number]   cc:   Brynda Greathouse  Tiburcio Pea, M.D.

## 2011-03-07 NOTE — Procedures (Signed)
[facility]  Patient:    KUMAR, FALWELL Visit Number: [id number] MRN: [id number]          Service Type: Attending:  Petra Kuba, M.D. Dictated by:   Petra Kuba, M.D. Proc. Date: 12/10/01   CC:         Arvella Merles, M.D.  Lorne Skeens. Hoxworth, M.D.   Procedure Report  PROCEDURE:  Colonoscopy with biopsy.  SURGEON:  Petra Kuba, M.D.  INDICATIONS:  Patient with persistent abdominal symptoms, some diarrhea, and abdominal pain.  Consent was signed after risks, benefits, methods, and options were thoroughly discussed in the office with both the patient and his wife.  MEDICINES USED:  Demerol 100 and Versed 10.  DESCRIPTION OF PROCEDURE:  Rectal inspection was pertinent for external hemorrhoids.  Digital exam was negative.  Video colonoscope was inserted and fairly easily advanced around the colon to the cecum.  This did require rolling him on his back with some abdominal pressure.  The cecum was identified by the appendiceal orifice and the ileocecal valve.  On insertion, some left-sided diverticula were seen, but no other abnormalities.  The scope was inserted a short ways into the terminal ileum, there were a few shallow ulcers which looked prep or nonsteroidal-induced, and scattered biopsies were obtained.  The scope was slowly withdrawn.  We had thought we were putting the random colon biopsies which were obtained in a separate container, but unfortunately, they all got put in the same container, so both the TI biopsies and the colon biopsies were put in the same container.  The scope was slowly withdrawn.  The prep was fairly adequate.  It did require lots of washing and suctioning.  There was some stool adherent to the wall of the colon.  Upon slow withdrawal, other than the left-sided diverticula, no other abnormalities were seen.  Once back in the rectum, the scope was retroflexed, and pertinent for some internal  hemorrhoids.  The scope was straightened and readvanced a short ways around the left side of the colon.  Air was suctioned and the scope removed.  The patient tolerated the procedure well.  There was no obvious or immediate complication.  ENDOSCOPIC DIAGNOSES: 1. Internal and external hemorrhoids. 2. Left-sided diverticula, scattered. 3. A few terminal ileum shallow ulcers without any inflammation, status post    biopsy. 4. Otherwise within normal limits to the terminal ileum and the cecum with    random biopsies of the colon.  Unfortunately, terminal ileum and colon    biopsies put in the same container accidentally.  PLAN:  Await pathology, consider Carafate or Questran trials, but certainly if there are signs of microscopic colitis, would try 5-ASA products.  Also, probably continue workup with a one-time upper GI small bowel follow through just to make sure no other small bowel significant lesions.  Will try to minimize aspirin and nonsteroidals in the meantime and use Tylenol only. Dictated by:   Petra Kuba, M.D. Attending:  Petra Kuba, M.D. DD:  12/10/01 TD:  12/11/01 Job: [id number]

## 2011-03-07 NOTE — Op Note (Signed)
   NAME:  Grant [name], Grant [name]                        ACCOUNT NO.:  [id number]   MEDICAL RECORD NO.:  [id number]                   PATIENT TYPE:  AMB   LOCATION:  CARD                                 FACILITY:  [facility]   PHYSICIAN:  Oley Balm. Sung Amabile, M.D. [facility]          DATE OF BIRTH:  10-15-1951   DATE OF PROCEDURE:  12/05/2002  DATE OF DISCHARGE:                                 OPERATIVE REPORT   PROCEDURE:  Bronchoscopy.   INDICATIONS:  Hemoptysis.   PREMEDICATION:  Fentanyl 100 mcg IV, Versed 6mg  IV.   ANESTHESIA:  Topical anesthesia applied to the nose and throat.  Lidocaine  1% 60 mL were used during the course of the procedure.   DESCRIPTION OF PROCEDURE:  After adequate sedation and anesthesia, the  bronchoscope was introduced via the left naris and advanced in the posterior  pharynx.  This demonstrated normal upper airway anatomy.  Vocal cords moved  symmetrically.  Further anesthesia was achieved with 1% lidocaine and the  scope was advanced in the trachea.  Complete airway anesthesia was achieved  with 1% lidocaine, and an airway examination was performed.  This  demonstrated normal segmental airway anatomy.  Bronchial mucosa was normal.  There were no foreign bodies, masses, or tumors noted.  There did appear to  be mild oozing of old blood with xanthochromic appearance from the lateral  segment of the right middle lobe.  Therefore, washings were obtained from  this segment.  The patient tolerated the procedure well without significant  complications.   IMPRESSION:  Hemoptysis of unclear etiology.  Essentially negative  bronchoscopic exam except oozing of old blood from the right middle lobe.   PLAN:  CT scan of the chest will be arranged.  The patient will be treated  for acute bronchitis with prednisone, antibiotics, and cough suppression.  Follow-up will be arranged for a repeat chest x-ray in one to two weeks.                                               Oley Balm  Sung Amabile, M.D. [facility]    DBS/MEDQ  D:  12/06/2002  T:  12/06/2002  Job:  [id number]   cc:   Holley Bouche, M.D.  [address]  [place], [place] [address]  Fax: [phone]

## 2011-04-09 ENCOUNTER — Ambulatory Visit (INDEPENDENT_AMBULATORY_CARE_PROVIDER_SITE_OTHER): Payer: Medicare Other

## 2011-04-09 DIAGNOSIS — J309 Allergic rhinitis, unspecified: Secondary | ICD-10-CM

## 2011-04-15 ENCOUNTER — Encounter: Payer: Self-pay | Admitting: Internal Medicine

## 2011-04-24 ENCOUNTER — Ambulatory Visit (INDEPENDENT_AMBULATORY_CARE_PROVIDER_SITE_OTHER): Payer: Medicare Other

## 2011-04-24 DIAGNOSIS — J309 Allergic rhinitis, unspecified: Secondary | ICD-10-CM

## 2011-05-02 ENCOUNTER — Ambulatory Visit (INDEPENDENT_AMBULATORY_CARE_PROVIDER_SITE_OTHER): Payer: Medicare Other

## 2011-05-02 DIAGNOSIS — J309 Allergic rhinitis, unspecified: Secondary | ICD-10-CM

## 2011-06-04 ENCOUNTER — Ambulatory Visit (INDEPENDENT_AMBULATORY_CARE_PROVIDER_SITE_OTHER): Payer: Medicare Other

## 2011-06-04 DIAGNOSIS — J309 Allergic rhinitis, unspecified: Secondary | ICD-10-CM

## 2011-06-09 ENCOUNTER — Ambulatory Visit (INDEPENDENT_AMBULATORY_CARE_PROVIDER_SITE_OTHER): Payer: Medicare Other

## 2011-06-09 DIAGNOSIS — J309 Allergic rhinitis, unspecified: Secondary | ICD-10-CM

## 2011-06-19 ENCOUNTER — Ambulatory Visit (INDEPENDENT_AMBULATORY_CARE_PROVIDER_SITE_OTHER): Payer: Medicare Other

## 2011-06-19 DIAGNOSIS — J309 Allergic rhinitis, unspecified: Secondary | ICD-10-CM

## 2011-07-14 LAB — URINALYSIS, ROUTINE W REFLEX MICROSCOPIC
Bilirubin Urine: NEGATIVE
Glucose, UA: NEGATIVE
Hgb urine dipstick: NEGATIVE
Ketones, ur: NEGATIVE
Nitrite: NEGATIVE
Protein, ur: NEGATIVE
Specific Gravity, Urine: 1.01
Urobilinogen, UA: 0.2
pH: 6.5

## 2011-07-14 LAB — CBC
HCT: 40.3
Hemoglobin: 14.3
MCHC: 35.4
MCV: 88.8
Platelets: 250
RBC: 4.54
RDW: 13.6
WBC: 6.7

## 2011-07-14 LAB — COMPREHENSIVE METABOLIC PANEL
ALT: 26
AST: 25
Albumin: 3.7
Alkaline Phosphatase: 57
BUN: 4 — ABNORMAL LOW
CO2: 24
Calcium: 9.2
Chloride: 107
Creatinine, Ser: 1.05
GFR calc Af Amer: >60
GFR calc non Af Amer: >60
Glucose, Bld: 152 — ABNORMAL HIGH
Potassium: 3.6
Sodium: 140
Total Bilirubin: 1
Total Protein: 6.4

## 2011-07-14 LAB — DIFFERENTIAL
Basophils Absolute: 0
Basophils Relative: 0
Eosinophils Absolute: 0.2
Eosinophils Relative: 2
Lymphocytes Relative: 20
Lymphs Abs: 1.4
Monocytes Absolute: 0.7
Monocytes Relative: 10
Neutro Abs: 4.5
Neutrophils Relative %: 67

## 2011-07-21 ENCOUNTER — Ambulatory Visit (INDEPENDENT_AMBULATORY_CARE_PROVIDER_SITE_OTHER): Payer: Medicare Other

## 2011-07-21 DIAGNOSIS — J309 Allergic rhinitis, unspecified: Secondary | ICD-10-CM

## 2011-08-05 ENCOUNTER — Ambulatory Visit (INDEPENDENT_AMBULATORY_CARE_PROVIDER_SITE_OTHER): Payer: Medicare Other

## 2011-08-05 DIAGNOSIS — J309 Allergic rhinitis, unspecified: Secondary | ICD-10-CM

## 2011-08-15 ENCOUNTER — Ambulatory Visit (INDEPENDENT_AMBULATORY_CARE_PROVIDER_SITE_OTHER): Payer: Medicare Other

## 2011-08-15 DIAGNOSIS — J309 Allergic rhinitis, unspecified: Secondary | ICD-10-CM

## 2011-08-27 ENCOUNTER — Ambulatory Visit (INDEPENDENT_AMBULATORY_CARE_PROVIDER_SITE_OTHER): Payer: Medicare Other

## 2011-08-27 DIAGNOSIS — J309 Allergic rhinitis, unspecified: Secondary | ICD-10-CM

## 2011-08-28 ENCOUNTER — Ambulatory Visit (INDEPENDENT_AMBULATORY_CARE_PROVIDER_SITE_OTHER): Payer: Medicare Other

## 2011-08-28 DIAGNOSIS — J309 Allergic rhinitis, unspecified: Secondary | ICD-10-CM

## 2011-09-08 ENCOUNTER — Ambulatory Visit (INDEPENDENT_AMBULATORY_CARE_PROVIDER_SITE_OTHER): Payer: Medicare Other

## 2011-09-08 DIAGNOSIS — J309 Allergic rhinitis, unspecified: Secondary | ICD-10-CM

## 2011-09-22 ENCOUNTER — Ambulatory Visit (INDEPENDENT_AMBULATORY_CARE_PROVIDER_SITE_OTHER): Payer: Medicare Other

## 2011-09-22 DIAGNOSIS — J309 Allergic rhinitis, unspecified: Secondary | ICD-10-CM

## 2011-09-29 ENCOUNTER — Ambulatory Visit (INDEPENDENT_AMBULATORY_CARE_PROVIDER_SITE_OTHER): Payer: Medicare Other

## 2011-09-29 DIAGNOSIS — J309 Allergic rhinitis, unspecified: Secondary | ICD-10-CM

## 2011-10-16 ENCOUNTER — Ambulatory Visit (INDEPENDENT_AMBULATORY_CARE_PROVIDER_SITE_OTHER): Payer: Medicare Other

## 2011-10-16 DIAGNOSIS — J309 Allergic rhinitis, unspecified: Secondary | ICD-10-CM

## 2011-11-24 ENCOUNTER — Ambulatory Visit (INDEPENDENT_AMBULATORY_CARE_PROVIDER_SITE_OTHER): Payer: Medicare Other

## 2011-11-24 DIAGNOSIS — J309 Allergic rhinitis, unspecified: Secondary | ICD-10-CM

## 2011-11-28 ENCOUNTER — Encounter: Payer: Self-pay | Admitting: Internal Medicine

## 2011-12-22 ENCOUNTER — Ambulatory Visit (INDEPENDENT_AMBULATORY_CARE_PROVIDER_SITE_OTHER): Payer: Medicare Other

## 2011-12-22 DIAGNOSIS — J309 Allergic rhinitis, unspecified: Secondary | ICD-10-CM

## 2011-12-30 ENCOUNTER — Ambulatory Visit (INDEPENDENT_AMBULATORY_CARE_PROVIDER_SITE_OTHER): Payer: Medicare Other

## 2011-12-30 DIAGNOSIS — J309 Allergic rhinitis, unspecified: Secondary | ICD-10-CM

## 2012-01-06 ENCOUNTER — Ambulatory Visit (INDEPENDENT_AMBULATORY_CARE_PROVIDER_SITE_OTHER): Payer: Medicare Other

## 2012-01-06 DIAGNOSIS — J309 Allergic rhinitis, unspecified: Secondary | ICD-10-CM

## 2012-01-12 ENCOUNTER — Ambulatory Visit (INDEPENDENT_AMBULATORY_CARE_PROVIDER_SITE_OTHER): Payer: Medicare Other

## 2012-01-12 DIAGNOSIS — J309 Allergic rhinitis, unspecified: Secondary | ICD-10-CM

## 2012-02-02 ENCOUNTER — Encounter: Payer: Self-pay | Admitting: Pulmonary Disease

## 2012-02-03 ENCOUNTER — Ambulatory Visit: Payer: Medicare Other | Admitting: Internal Medicine

## 2012-04-28 ENCOUNTER — Ambulatory Visit (INDEPENDENT_AMBULATORY_CARE_PROVIDER_SITE_OTHER): Payer: Medicare Other

## 2012-04-28 DIAGNOSIS — J309 Allergic rhinitis, unspecified: Secondary | ICD-10-CM

## 2012-05-03 ENCOUNTER — Ambulatory Visit (INDEPENDENT_AMBULATORY_CARE_PROVIDER_SITE_OTHER): Payer: Medicare Other

## 2012-05-03 DIAGNOSIS — J309 Allergic rhinitis, unspecified: Secondary | ICD-10-CM

## 2012-05-04 ENCOUNTER — Telehealth: Payer: Self-pay | Admitting: Internal Medicine

## 2012-05-04 NOTE — Telephone Encounter (Signed)
I spoke with the pt spouse and she states that that the pt has been having increased chest tightness, SOB x 2 days. Pt is requesting an appt to come in for breathing treatment. I offered the pt an appt to see VS tomorrow at 9:15am and advised Dr. Craige Cotta will decide on the breathing treatment at that time. I advised if condition worsens to go to ER or urgent care. Carron Curie, CMA

## 2012-05-05 ENCOUNTER — Ambulatory Visit (INDEPENDENT_AMBULATORY_CARE_PROVIDER_SITE_OTHER): Payer: Medicare Other | Admitting: Pulmonary Disease

## 2012-05-05 ENCOUNTER — Encounter: Payer: Self-pay | Admitting: Pulmonary Disease

## 2012-05-05 VITALS — BP 140/90 | HR 74 | Temp 97.6°F | Ht 71.0 in | Wt 276.4 lb

## 2012-05-05 DIAGNOSIS — J45909 Unspecified asthma, uncomplicated: Secondary | ICD-10-CM

## 2012-05-05 DIAGNOSIS — J45901 Unspecified asthma with (acute) exacerbation: Secondary | ICD-10-CM

## 2012-05-05 MED ORDER — BUDESONIDE-FORMOTEROL FUMARATE 160-4.5 MCG/ACT IN AERO: 2.0000 | INHALATION_SPRAY | Freq: Two times a day (BID) | RESPIRATORY_TRACT | Status: DC

## 2012-05-05 MED ORDER — METHYLPREDNISOLONE ACETATE 80 MG/ML IJ SUSP
40.0000 mg | Freq: Once | INTRAMUSCULAR | Status: AC
  Administered 2012-05-05: 40 mg via INTRAMUSCULAR

## 2012-05-05 NOTE — Progress Notes (Signed)
Chief Complaint  Patient presents with  . Acute Visit    CDY pt. Pt c/o increase SOB, cough w/ brown to white, phlem, wheezing, chest tx x3 days. pt went into a house with a lot of dogs and it triggered these symptoms    History of Present Illness: Grant [name] is a 61 y.o. male former smoker with asthma and allergic rhinitis, OSA.  He is followed by Dr. Maple Hudson.  He was last seen September 05, 2010.  He was getting allergy shots, but [place] feel like [place] needed [place].  He has been travelling between [place] and [place].  He notices his allergies are much worse in [place].    [place].  The person he was staying with has lots of animals in the home.  After this exposure he starting having increased cough with green sputum.  He has been more short of breath with wheezing.  He has some sinus congestion and sore throat, but most of his symptoms are in his chest.  He has been using proair several times this past week, and this helps.    He has a standing prescription for zithromax 500 mg pill from Dr. [name].  He started taking this 05/04/12, and feels this has helped.  This script is for 7 days.  He denies fever, skin rash, or abdominal symptoms.   Past Medical History  Diagnosis Date  . Syncope and collapse   . Depressive disorder, not elsewhere classified   . Obstructive sleep apnea (adult) (pediatric)   . Personal history of other diseases of digestive system   . Allergic rhinitis, cause unspecified   . Unspecified chronic bronchitis     Past Surgical History  Procedure Date  . Cholecystectomy   . Hernia repair     Outpatient Encounter Prescriptions as of 05/05/2012  Medication Sig Dispense Refill  . albuterol (PROAIR HFA) 108 (90 BASE) MCG/ACT inhaler Inhale 2 puffs into the lungs every 6 (six) hours as needed for wheezing or shortness of breath.  3 Inhaler  3  . albuterol (PROVENTIL HFA) 108 (90 BASE) MCG/ACT inhaler Inhale 2  puffs into the lungs as needed.      . ALPRAZolam (XANAX) 0.5 MG tablet Take 0.5 mg by mouth daily.        Marland Kitchen ascorbic acid (C-1000/ROSE HIPS) 1000 MG tablet Take 1,000 mg by mouth daily.      . benzonatate (TESSALON) 100 MG capsule Take 1 to 2 capsules every 3 to 4 hours as needed       . Cholecalciferol (VITAMIN D) 2000 UNITS CAPS Take 1 capsule by mouth daily.        Marland Kitchen COLCRYS 0.6 MG tablet Once a day      . fluticasone (FLONASE) 50 MCG/ACT nasal spray 2 sprays by Nasal route at bedtime.  180 Act  3  . levothyroxine (SYNTHROID, LEVOTHROID) 100 MCG tablet Take 100 mcg by mouth daily.        Marland Kitchen liothyronine (CYTOMEL) 25 MCG tablet        . loratadine (CLARITIN) 10 MG tablet Take 10 mg by mouth daily.      Marland Kitchen losartan-hydrochlorothiazide (HYZAAR) 50-12.5 MG per tablet Take 1 tablet by mouth daily.        . Multiple Vitamin (MULTIVITAMIN) capsule Take 1 capsule by mouth daily.        Marland Kitchen omeprazole (PRILOSEC) 40 MG capsule Take 40 mg by  mouth 2 (two) times daily.        Marland Kitchen perphenazine (TRILAFON) 2 MG tablet As needed       . Probiotic Product (ALIGN) 4 MG CAPS Take 1 capsule by mouth daily.        Marland Kitchen venlafaxine (EFFEXOR-XR) 150 MG 24 hr capsule Take 150 mg by mouth daily.        . vitamin B-12 (CYANOCOBALAMIN) 1000 MCG tablet Take 1,000 mcg by mouth daily.        Marland Kitchen DISCONTD: anastrozole (ARIMIDEX) 1 MG tablet Take 1 mg by mouth once a week.        Marland Kitchen DISCONTD: fexofenadine-pseudoephedrine (ALLEGRA-D 24) 180-240 MG per 24 hr tablet Take 1 tablet by mouth daily.          Allergies  Allergen Reactions  . Codeine     Physical Exam:  Blood pressure 140/90, pulse 74, temperature 97.6 F (36.4 C), temperature source Oral, height 5\' 11"  (1.803 m), weight 276 lb 6.4 oz (125.374 kg), SpO2 96.00%.  Body mass index is 38.55 kg/(m^2). Wt Readings from Last 2 Encounters:  05/05/12 276 lb 6.4 oz (125.374 kg)  09/05/10 262 lb 4 oz (118.956 kg)    General - Obese, sweaty ENT - TM with mild erythema on  Lt>>no exudate/pain, no sinus tenderness, clear nasal discharge, no oral exudate, no LAN, no thyromegaly Cardiac - s1s2 regular, no murmur, pulses symmetric, no edema Chest - normal respiratory excursion, decreased breath sounds, coarse breath sounds b/l, no wheeze Back - no focal tenderness Abd - soft, non-tender, no organomegaly, + bowel sounds Ext - normal motor strength Neuro - Cranial nerves are normal. PERLA. EOM's intact. Skin - no discernible active dermatitis, erythema, urticaria or inflammatory process. Psych - normal mood, and behavior.  Spirometry 05/05/12>>FEV1 3.90 (101%), FEV1% 80  Assessment/Plan:  Coralyn Helling, MD [facility] Pager:  [phone] 05/05/2012, 9:25 AM

## 2012-05-05 NOTE — Patient Instructions (Signed)
Depo-medrol injection today Finish course of zithromax from Dr. [name] Symbicort two puffs twice per day, and rinse mouth after each use Proair two puffs as needed for cough, wheeze, or chest congestion Follow up with Dr. Maple Hudson as scheduled next week

## 2012-05-05 NOTE — Addendum Note (Signed)
Addended by: Tommie Sams on: 05/05/2012 10:16 AM   Modules accepted: Orders

## 2012-05-05 NOTE — Assessment & Plan Note (Signed)
He has acute flare of his asthma likely triggered by allergen exposure.    He has requested to get depo-medrol shot since prednisone pills make him "crazy".  Will give him sample of symbicort, and he can continue prn proair.  He is to finish zithromax script from Dr. [name].

## 2012-05-10 ENCOUNTER — Encounter: Payer: Self-pay | Admitting: Internal Medicine

## 2012-05-10 ENCOUNTER — Ambulatory Visit (INDEPENDENT_AMBULATORY_CARE_PROVIDER_SITE_OTHER): Payer: Medicare Other

## 2012-05-10 ENCOUNTER — Ambulatory Visit (INDEPENDENT_AMBULATORY_CARE_PROVIDER_SITE_OTHER): Payer: Medicare Other | Admitting: Internal Medicine

## 2012-05-10 VITALS — BP 140/92 | HR 97 | Ht 71.0 in | Wt 273.0 lb

## 2012-05-10 DIAGNOSIS — J3089 Other allergic rhinitis: Secondary | ICD-10-CM

## 2012-05-10 DIAGNOSIS — J309 Allergic rhinitis, unspecified: Secondary | ICD-10-CM

## 2012-05-10 DIAGNOSIS — F329 Major depressive disorder, single episode, unspecified: Secondary | ICD-10-CM

## 2012-05-10 DIAGNOSIS — J302 Other seasonal allergic rhinitis: Secondary | ICD-10-CM

## 2012-05-10 DIAGNOSIS — J45901 Unspecified asthma with (acute) exacerbation: Secondary | ICD-10-CM

## 2012-05-10 DIAGNOSIS — J45909 Unspecified asthma, uncomplicated: Secondary | ICD-10-CM

## 2012-05-10 DIAGNOSIS — F3289 Other specified depressive episodes: Secondary | ICD-10-CM

## 2012-05-10 MED ORDER — DOXYCYCLINE HYCLATE 100 MG PO TABS: ORAL_TABLET | ORAL | Status: DC

## 2012-05-10 MED ORDER — METHYLPREDNISOLONE ACETATE 80 MG/ML IJ SUSP
80.0000 mg | Freq: Once | INTRAMUSCULAR | Status: AC
  Administered 2012-05-10: 80 mg via INTRAMUSCULAR

## 2012-05-10 NOTE — Patient Instructions (Addendum)
Depo 80  Script sent for doxycycline antibiotic  Ok to skip allergy vaccine while at [place]. On return, you can restart the shots here- I will give instructions to the allergy lab to restart each time at 0.1 ml/ 1:10 and build as tolerated each time he restarts on return from the beach.

## 2012-05-10 NOTE — Progress Notes (Signed)
Chief Complaint  Patient presents with  . Acute Visit    CDY pt. Pt c/o increase SOB, cough w/ brown to white, phlem, wheezing, chest tx x3 days. pt went into a house with a lot of dogs and it triggered these symptoms    History of Present Illness: Grant [name] is a 61 y.o. male former smoker with asthma and allergic rhinitis, OSA.  He is followed by Dr. Maple [name].  He was last seen September 05, 2010.  He was getting allergy shots, but didn't feel like he needed them [place].  He has been travelling between [place] and [place].  He notices his allergies are much worse in [place].    He [place] from [place] coast one [place].  The person he was staying with has lots of animals in the home.  After this exposure he starting having increased cough with green sputum.  He has been more short of breath with wheezing.  He has some sinus congestion and sore throat, but most of his symptoms are in his chest.  He has been using proair several times this past week, and this helps.    He has a standing prescription for zithromax 500 mg pill from Dr. Alessandra [name].  He started taking this 05/04/12, and feels this has helped.  This script is for 7 days.  He denies fever, skin rash, or abdominal symptoms.  05/10/12- 60 yoM former smoker followed for  OSA, allergic rhinitis/ quit vaccine, hx asthma/ bronchitis   Wife here Pt states increase sob,wheezing, productive cough.  As a breathing was "great" for a few months after last visit, and he does very well when he is staying at the beach. Somebody has to stay with him and to drive him. Apparently he does not travel well which is why he can't just say at the beach. And travel back for appointments. He visited a house with cats which triggered wheezing and cough. Singulair did not help. He was seen in this office last week and given Depo-Medrol which definitely helped. They would like to try that again. He has restarted allergy vaccine at 0.1 mL/1:10 after our  discussion. He is not sure if they help "like all other medicines". Still coughing badly with sinus congestion and sore throats but no fever and nothing purulent.  ROS-see HPI Constitutional:   No-   weight loss, night sweats, fevers, chills, fatigue, lassitude. HEENT:   No-  headaches, difficulty swallowing, tooth/dental problems, sore throat,       No-  sneezing, itching, ear ache, nasal congestion, post nasal drip,  CV:  No-   chest pain, orthopnea, PND, swelling in lower extremities, anasarca,  dizziness, palpitations Resp: No-   shortness of breath with exertion or at rest.              No-   productive cough,  No non-productive cough,  No- coughing up of blood.              No-   change in color of mucus.  No- wheezing.   Skin: No-   rash or lesions. GI:  No-   heartburn, indigestion, abdominal pain, nausea, vomiting,  GU:  MS:  No-   joint pain or swelling.   Neuro-     nothing unusual Psych:  No- change in mood or affect. + depression or anxiety.  No memory loss.  OBJ- Physical Exam General- Alert, Oriented, Affect-appropriate, Distress- none acute, wearing dark sunglasses,  looking vaguely around the room, not focused on person to whom he speaks. Skin- rash-none, lesions- none, excoriation- none Lymphadenopathy- none Head- atraumatic            Eyes- Gross vision intact, PERRLA, conjunctivae and secretions clear            Ears- Hearing, canals-normal            Nose- Clear, no-Septal dev, mucus, polyps, erosion, perforation             Throat- Mallampati II , mucosa clear , drainage- none, tonsils- atrophic Neck- flexible , trachea midline, no stridor , thyroid nl, carotid no bruit Chest - symmetrical excursion , unlabored           Heart/CV- RRR , no murmur , no gallop  , no rub, nl s1 s2                           - JVD- none , edema- none, stasis changes- none, varices- none           Lung- clear to P&A, wheeze- none, cough+ with deep breath , dullness-none, rub- none            Chest wall-  Abd- Br/ Gen/ Rectal- Not done, not indicated Extrem- cyanosis- none, clubbing, none, atrophy- none, strength- nl Neuro- grossly intact to observation

## 2012-05-13 NOTE — Assessment & Plan Note (Signed)
This is the working diagnosis, but it looks more complicated to me as a non--psychiatrist.

## 2012-05-13 NOTE — Assessment & Plan Note (Signed)
Okay to skip allergy vaccine while at the beach. On first return, he will resume at 0.1 mL/1:10 and rebuild as tolerated while he is here.

## 2012-05-13 NOTE — Assessment & Plan Note (Signed)
Plan-Depo-Medrol today

## 2012-05-17 ENCOUNTER — Ambulatory Visit (INDEPENDENT_AMBULATORY_CARE_PROVIDER_SITE_OTHER): Payer: Medicare Other

## 2012-05-17 DIAGNOSIS — J309 Allergic rhinitis, unspecified: Secondary | ICD-10-CM

## 2012-07-20 ENCOUNTER — Ambulatory Visit (INDEPENDENT_AMBULATORY_CARE_PROVIDER_SITE_OTHER): Payer: Medicare Other

## 2012-07-20 DIAGNOSIS — J309 Allergic rhinitis, unspecified: Secondary | ICD-10-CM

## 2012-08-02 ENCOUNTER — Encounter: Payer: Self-pay | Admitting: Internal Medicine

## 2012-08-02 ENCOUNTER — Ambulatory Visit (INDEPENDENT_AMBULATORY_CARE_PROVIDER_SITE_OTHER): Payer: Medicare Other | Admitting: Internal Medicine

## 2012-08-02 VITALS — BP 118/78 | HR 67 | Ht 71.0 in | Wt 277.2 lb

## 2012-08-02 DIAGNOSIS — R21 Rash and other nonspecific skin eruption: Secondary | ICD-10-CM

## 2012-08-02 MED ORDER — METHYLPREDNISOLONE ACETATE 80 MG/ML IJ SUSP
80.0000 mg | Freq: Once | INTRAMUSCULAR | Status: AC
  Administered 2012-08-02: 80 mg via INTRAMUSCULAR

## 2012-08-02 NOTE — Progress Notes (Signed)
Chief Complaint  Patient presents with  . Acute Visit    CDY pt. Pt c/o increase SOB, cough w/ brown to white, phlem, wheezing, chest tx x3 days. pt went into a house with a lot of dogs and it triggered these symptoms    History of Present Illness: Grant [name] is a 61 y.o. male former smoker with asthma and allergic rhinitis, OSA.  He is followed by Dr. Maple Hudson.  He was last seen September 05, 2010.  He was getting allergy shots, but [place] like [place].  [place] has been travelling between [place] and [place].  He notices his allergies are much worse in [place].    [place].  The person he was staying with has lots of animals in the home.  After this exposure he starting having increased cough with green sputum.  He has been more short of breath with wheezing.  He has some sinus congestion and sore throat, but most of his symptoms are in his chest.  He has been using proair several times this past week, and this helps.    He has a standing prescription for zithromax 500 mg pill from Dr. [name].  He started taking this 05/04/12, and feels this has helped.  This script is for 7 days.  He denies fever, skin rash, or abdominal symptoms.  05/10/12- 60 yoM former smoker followed for  OSA, allergic rhinitis/ quit vaccine, hx asthma/ bronchitis   Wife here Pt states increase sob,wheezing, productive cough.  As a breathing was "great" for a few months after last visit, and he does very well [place] the beach. Somebody has to stay with him and to drive him. Apparently he does not travel well which is why he can't just say at the beach. And travel back for appointments. He visited a house with cats which triggered wheezing and cough. Singulair did not help. He was seen in this office last week and given Depo-Medrol which definitely helped. They would like to try that again. He has restarted allergy vaccine at 0.1 mL/1:10 after our  discussion. He is not sure if they help "like all other medicines". Still coughing badly with sinus congestion and sore throats but no fever and nothing purulent.  08/02/12-61 yoM former smoker followed for  OSA, allergic rhinitis/ quit vaccine, hx asthma/ bronchitis   Wife here Pt states increase sob,wheezing, productive cough.  ACUTE VISIT: rash covering body-spots and red. Rash x2 days. It started Toprol, Xarelto, ditiazem for atrial fibrillation but timing uncertain. In the last week it had swollen glands sore throat. Finished a Z-Pak 3 days ago. Not treating the rash with Benadryl. When we discussed steroid therapy he asked for Depo-Medrol- the" pills make me mean".  ROS-see HPI Constitutional:   No-   weight loss, night sweats, fevers, chills, fatigue, lassitude. HEENT:   No-  headaches, difficulty swallowing, tooth/dental problems, sore throat,       No-  sneezing, itching, ear ache, nasal congestion, post nasal drip,  CV:  No-   chest pain, orthopnea, PND, swelling in lower extremities, anasarca,  dizziness, palpitations Resp: No-   shortness of breath with exertion or at rest.              No-   productive cough,  No non-productive cough,  No- coughing up of blood.  No-   change in color of mucus.  No- wheezing.   Skin: + Rash per HPI. GI:  No-   heartburn, indigestion, abdominal pain, nausea, vomiting,  GU:  MS:  No-   joint pain or swelling.   Neuro-     nothing unusual Psych:  No- change in mood or affect. + depression or anxiety.  No memory loss.  OBJ- Physical Exam General- Alert, Oriented, Affect-appropriate, Distress- none acute, wearing dark sunglasses, looking vaguely around the room, not focused on person to whom he speaks. Skin- + diffuse macular pink rash over upper extremities and trunk without excoriation Lymphadenopathy- none Head- atraumatic            Eyes- Gross vision intact, PERRLA, conjunctivae and secretions clear            Ears- Hearing,  canals-normal            Nose- Clear, no-Septal dev, mucus, polyps, erosion, perforation             Throat- Mallampati II , mucosa clear , drainage- none, tonsils- atrophic Neck- flexible , trachea midline, no stridor , thyroid nl, carotid no bruit Chest - symmetrical excursion , unlabored           Heart/CV- RRR , no murmur , no gallop  , no rub, nl s1 s2                           - JVD- none , edema- none, stasis changes- none, varices- none           Lung- clear to P&A, wheeze- none, cough+ with deep breath , dullness-none, rub- none           Chest wall-  Abd- Br/ Gen/ Rectal- Not done, not indicated Extrem- cyanosis- none, clubbing, none, atrophy- none, strength- nl Neuro- grossly intact to observation

## 2012-08-02 NOTE — Patient Instructions (Addendum)
Depo 80  Suggest daily use of full dose antihistamine- allegra/ fexofenadine 180 mg, supplemented with benadryl if needed  I suspect the trigger was azithromycin/ zithromax. Don't take that again.

## 2012-08-04 ENCOUNTER — Telehealth: Payer: Self-pay | Admitting: Internal Medicine

## 2012-08-04 MED ORDER — DOXYCYCLINE HYCLATE 100 MG PO TABS: ORAL_TABLET | ORAL | Status: DC

## 2012-08-04 MED ORDER — METHYLPREDNISOLONE 8 MG PO TABS: ORAL_TABLET | ORAL | Status: DC

## 2012-08-04 NOTE — Telephone Encounter (Signed)
I spoke with the pt spouse and she states thay have 2 issues: 1) the pt rash that he was seen for on 08-02-12 has not improved at all. She states Dr. Maple Hudson advised they call back if no improvement. The rash is not worse, just no better.   2) Also the pt is having increased wheezing, chest congestion, productive cough with white phlegm, swollen glands, chest tightness. Pt spouse states the pt usually will take zpak for this, but at last OV Dr. Maple Hudson advised the pt to never take that med again. Please advise.Carron Curie, CMA Allergies  Allergen Reactions  . Codeine

## 2012-08-04 NOTE — Telephone Encounter (Signed)
Per CY offer the pt doxycycline 100mg  take 2 today and then 1 daily until gone #8, and medrol 8mg  #20 4x2, 3x2, 2x2, 1x2, then stop.Also offer xanax 0.5mg  1 every 8 hours as needed for anxiety and mood changes due to steroids if needed. Pt wife is aware of recs. She states they have some xanax so they do not need this. Rx sent to [facility]. Carron Curie, CMA

## 2012-08-10 DIAGNOSIS — R21 Rash and other nonspecific skin eruption: Secondary | ICD-10-CM | POA: Insufficient documentation

## 2012-08-10 NOTE — Assessment & Plan Note (Signed)
I would favor this either being a drug rash, most likely related to Zithromax, or viral. Zithromax course is now completed. Plan-skip allergy shot today. Depo-Medrol. Allegra 180 mg daily, may supplement with Benadryl if needed

## 2012-09-07 ENCOUNTER — Ambulatory Visit (INDEPENDENT_AMBULATORY_CARE_PROVIDER_SITE_OTHER): Payer: Medicare Other

## 2012-09-07 DIAGNOSIS — J309 Allergic rhinitis, unspecified: Secondary | ICD-10-CM

## 2012-09-21 ENCOUNTER — Ambulatory Visit (INDEPENDENT_AMBULATORY_CARE_PROVIDER_SITE_OTHER): Payer: Medicare Other

## 2012-09-21 DIAGNOSIS — J309 Allergic rhinitis, unspecified: Secondary | ICD-10-CM

## 2012-09-29 ENCOUNTER — Ambulatory Visit (INDEPENDENT_AMBULATORY_CARE_PROVIDER_SITE_OTHER): Payer: Medicare Other

## 2012-09-29 DIAGNOSIS — J309 Allergic rhinitis, unspecified: Secondary | ICD-10-CM

## 2012-10-19 ENCOUNTER — Encounter: Payer: Self-pay | Admitting: Internal Medicine

## 2012-11-03 ENCOUNTER — Ambulatory Visit (INDEPENDENT_AMBULATORY_CARE_PROVIDER_SITE_OTHER): Payer: Medicare Other

## 2012-11-03 DIAGNOSIS — J309 Allergic rhinitis, unspecified: Secondary | ICD-10-CM

## 2012-11-10 ENCOUNTER — Ambulatory Visit (INDEPENDENT_AMBULATORY_CARE_PROVIDER_SITE_OTHER): Payer: Medicare Other

## 2012-11-10 DIAGNOSIS — J309 Allergic rhinitis, unspecified: Secondary | ICD-10-CM

## 2012-11-17 ENCOUNTER — Ambulatory Visit: Payer: Medicare Other

## 2012-11-24 ENCOUNTER — Ambulatory Visit: Payer: Medicare Other

## 2012-11-30 ENCOUNTER — Ambulatory Visit (INDEPENDENT_AMBULATORY_CARE_PROVIDER_SITE_OTHER): Payer: Medicare Other

## 2012-11-30 DIAGNOSIS — J309 Allergic rhinitis, unspecified: Secondary | ICD-10-CM

## 2012-12-01 ENCOUNTER — Ambulatory Visit (INDEPENDENT_AMBULATORY_CARE_PROVIDER_SITE_OTHER): Payer: Medicare Other

## 2012-12-01 ENCOUNTER — Ambulatory Visit: Payer: Medicare Other

## 2012-12-01 DIAGNOSIS — J309 Allergic rhinitis, unspecified: Secondary | ICD-10-CM

## 2012-12-04 ENCOUNTER — Other Ambulatory Visit: Payer: Self-pay

## 2012-12-08 ENCOUNTER — Ambulatory Visit: Payer: Medicare Other

## 2012-12-08 ENCOUNTER — Ambulatory Visit (INDEPENDENT_AMBULATORY_CARE_PROVIDER_SITE_OTHER): Payer: Medicare Other

## 2012-12-08 DIAGNOSIS — J309 Allergic rhinitis, unspecified: Secondary | ICD-10-CM

## 2012-12-15 ENCOUNTER — Ambulatory Visit: Payer: Medicare Other

## 2012-12-15 ENCOUNTER — Ambulatory Visit (INDEPENDENT_AMBULATORY_CARE_PROVIDER_SITE_OTHER): Payer: Medicare Other

## 2012-12-15 DIAGNOSIS — J309 Allergic rhinitis, unspecified: Secondary | ICD-10-CM

## 2012-12-22 ENCOUNTER — Ambulatory Visit: Payer: Medicare Other

## 2012-12-22 ENCOUNTER — Ambulatory Visit (INDEPENDENT_AMBULATORY_CARE_PROVIDER_SITE_OTHER): Payer: Medicare Other

## 2012-12-22 DIAGNOSIS — J309 Allergic rhinitis, unspecified: Secondary | ICD-10-CM

## 2012-12-29 ENCOUNTER — Ambulatory Visit: Payer: Medicare Other

## 2013-01-07 ENCOUNTER — Ambulatory Visit (INDEPENDENT_AMBULATORY_CARE_PROVIDER_SITE_OTHER): Payer: Medicare Other

## 2013-01-07 DIAGNOSIS — J309 Allergic rhinitis, unspecified: Secondary | ICD-10-CM

## 2013-01-14 ENCOUNTER — Ambulatory Visit (INDEPENDENT_AMBULATORY_CARE_PROVIDER_SITE_OTHER): Payer: Medicare Other | Admitting: Adult Health

## 2013-01-14 ENCOUNTER — Ambulatory Visit (INDEPENDENT_AMBULATORY_CARE_PROVIDER_SITE_OTHER): Payer: Medicare Other

## 2013-01-14 ENCOUNTER — Encounter: Payer: Self-pay | Admitting: Adult Health

## 2013-01-14 ENCOUNTER — Telehealth: Payer: Self-pay | Admitting: Pulmonary Disease

## 2013-01-14 VITALS — BP 142/82 | HR 70 | Temp 97.8°F | Ht 71.0 in | Wt 289.2 lb

## 2013-01-14 DIAGNOSIS — J42 Unspecified chronic bronchitis: Secondary | ICD-10-CM

## 2013-01-14 DIAGNOSIS — J309 Allergic rhinitis, unspecified: Secondary | ICD-10-CM

## 2013-01-14 NOTE — Telephone Encounter (Signed)
Please have patient come in today at 3:30pm with TP per CY. We will get him a RX for CPAP and supplies as well when he is seen today. Thanks.

## 2013-01-14 NOTE — Telephone Encounter (Signed)
I spoke with pt and is scheduled to come in at 3:30 w/ TP. Nothing further was needed
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
## 2013-01-17 ENCOUNTER — Ambulatory Visit (INDEPENDENT_AMBULATORY_CARE_PROVIDER_SITE_OTHER): Payer: Medicare Other

## 2013-01-17 ENCOUNTER — Encounter: Payer: Self-pay | Admitting: Adult Health

## 2013-01-17 ENCOUNTER — Ambulatory Visit (INDEPENDENT_AMBULATORY_CARE_PROVIDER_SITE_OTHER)
Admission: RE | Admit: 2013-01-17 | Discharge: 2013-01-17 | Disposition: A | Discharge status: Home / Self Care | Payer: Medicare Other | Source: Ambulatory Visit | Attending: Adult Health | Admitting: Adult Health

## 2013-01-17 ENCOUNTER — Ambulatory Visit (INDEPENDENT_AMBULATORY_CARE_PROVIDER_SITE_OTHER): Payer: Medicare Other | Admitting: Adult Health

## 2013-01-17 VITALS — BP 132/80 | HR 62 | Temp 97.1°F | Ht 71.0 in | Wt 289.0 lb

## 2013-01-17 DIAGNOSIS — J42 Unspecified chronic bronchitis: Secondary | ICD-10-CM

## 2013-01-17 DIAGNOSIS — J309 Allergic rhinitis, unspecified: Secondary | ICD-10-CM

## 2013-01-17 DIAGNOSIS — G4733 Obstructive sleep apnea (adult) (pediatric): Secondary | ICD-10-CM

## 2013-01-17 DIAGNOSIS — J45901 Unspecified asthma with (acute) exacerbation: Secondary | ICD-10-CM

## 2013-01-17 DIAGNOSIS — J45909 Unspecified asthma, uncomplicated: Secondary | ICD-10-CM

## 2013-01-17 NOTE — Patient Instructions (Addendum)
Finish Doxycycline .  I will call with xray results.  Follow up Dr. Maple Hudson  As planned and As needed

## 2013-01-17 NOTE — Patient Instructions (Signed)
Pt rescheduled
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
## 2013-01-17 NOTE — Progress Notes (Signed)
Not seen.  Rescheduled.

## 2013-01-20 MED ORDER — ALBUTEROL SULFATE HFA 108 (90 BASE) MCG/ACT IN AERS: 2.0000 | INHALATION_SPRAY | Freq: Four times a day (QID) | RESPIRATORY_TRACT | Status: DC | PRN

## 2013-01-20 NOTE — Addendum Note (Signed)
Addended by: Boone Master E on: 01/20/2013 10:00 AM   Modules accepted: Orders

## 2013-01-20 NOTE — Progress Notes (Signed)
Quick Note:  Patient returned call. Advised of cxr results / recs as stated by TP. Pt verbalized understanding and denied any questions. ______ 

## 2013-01-20 NOTE — Progress Notes (Signed)
Quick Note:  LMOM TCB x1. ______ 

## 2013-02-14 ENCOUNTER — Ambulatory Visit (INDEPENDENT_AMBULATORY_CARE_PROVIDER_SITE_OTHER): Payer: Medicare Other

## 2013-02-14 DIAGNOSIS — J309 Allergic rhinitis, unspecified: Secondary | ICD-10-CM

## 2013-02-21 ENCOUNTER — Ambulatory Visit: Payer: Medicare Other

## 2013-03-02 ENCOUNTER — Ambulatory Visit (INDEPENDENT_AMBULATORY_CARE_PROVIDER_SITE_OTHER): Payer: Medicare Other

## 2013-03-02 DIAGNOSIS — J309 Allergic rhinitis, unspecified: Secondary | ICD-10-CM

## 2013-03-09 ENCOUNTER — Ambulatory Visit: Payer: Medicare Other

## 2013-03-16 ENCOUNTER — Ambulatory Visit (INDEPENDENT_AMBULATORY_CARE_PROVIDER_SITE_OTHER): Payer: Medicare Other

## 2013-03-16 DIAGNOSIS — J309 Allergic rhinitis, unspecified: Secondary | ICD-10-CM

## 2013-03-23 ENCOUNTER — Ambulatory Visit: Payer: Medicare Other

## 2013-04-04 ENCOUNTER — Telehealth: Payer: Self-pay | Admitting: Internal Medicine

## 2013-04-04 MED ORDER — BUDESONIDE-FORMOTEROL FUMARATE 160-4.5 MCG/ACT IN AERO: 2.0000 | INHALATION_SPRAY | Freq: Two times a day (BID) | RESPIRATORY_TRACT | Status: DC

## 2013-04-04 NOTE — Telephone Encounter (Signed)
I spoke with spouse and is aware rx has been sent. Nothing further was needed

## 2013-04-07 ENCOUNTER — Telehealth: Payer: Self-pay | Admitting: Internal Medicine

## 2013-04-07 NOTE — Telephone Encounter (Signed)
Spoke with pharmacy-they did not have RX refill on file nor any of Grant [name]'s information. I gave needed information for Grant [name] and gave verbal rx for patient. Grant [name](pt's wife) is aware that Rx has been taken care of.  Nothing more needed at this time.

## 2013-04-13 ENCOUNTER — Ambulatory Visit (INDEPENDENT_AMBULATORY_CARE_PROVIDER_SITE_OTHER): Payer: Medicare Other

## 2013-04-13 DIAGNOSIS — J309 Allergic rhinitis, unspecified: Secondary | ICD-10-CM

## 2013-04-18 ENCOUNTER — Ambulatory Visit (INDEPENDENT_AMBULATORY_CARE_PROVIDER_SITE_OTHER): Payer: Medicare Other

## 2013-04-18 DIAGNOSIS — J309 Allergic rhinitis, unspecified: Secondary | ICD-10-CM

## 2013-04-27 ENCOUNTER — Encounter: Payer: Self-pay | Admitting: *Deleted

## 2013-05-10 ENCOUNTER — Ambulatory Visit (INDEPENDENT_AMBULATORY_CARE_PROVIDER_SITE_OTHER): Payer: Medicare Other

## 2013-05-10 ENCOUNTER — Other Ambulatory Visit: Payer: Self-pay | Admitting: Urology

## 2013-05-10 DIAGNOSIS — J309 Allergic rhinitis, unspecified: Secondary | ICD-10-CM

## 2013-05-18 ENCOUNTER — Ambulatory Visit: Payer: Medicare Other

## 2013-05-18 ENCOUNTER — Encounter: Payer: Self-pay | Admitting: Cardiovascular Disease

## 2013-05-18 ENCOUNTER — Ambulatory Visit (INDEPENDENT_AMBULATORY_CARE_PROVIDER_SITE_OTHER): Payer: Medicare Other | Admitting: Cardiovascular Disease

## 2013-05-18 VITALS — BP 132/78 | HR 62 | Ht 70.0 in | Wt 280.1 lb

## 2013-05-18 DIAGNOSIS — I480 Paroxysmal atrial fibrillation: Secondary | ICD-10-CM | POA: Insufficient documentation

## 2013-05-18 DIAGNOSIS — Z01810 Encounter for preprocedural cardiovascular examination: Secondary | ICD-10-CM

## 2013-05-18 DIAGNOSIS — I10 Essential (primary) hypertension: Secondary | ICD-10-CM

## 2013-05-18 DIAGNOSIS — Z7901 Long term (current) use of anticoagulants: Secondary | ICD-10-CM | POA: Insufficient documentation

## 2013-05-18 MED ORDER — LOSARTAN POTASSIUM 100 MG PO TABS: 100.0000 mg | ORAL_TABLET | Freq: Every day | ORAL | Status: DC

## 2013-05-18 NOTE — Assessment & Plan Note (Signed)
Simplify meds D/C cardizem and increase cozaar to 100 mg

## 2013-05-18 NOTE — Addendum Note (Signed)
Addended by: Scherrie Bateman E on: 05/18/2013 09:37 AM   Modules accepted: Orders
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
## 2013-05-18 NOTE — Patient Instructions (Signed)
Your physician wants you to follow-up in:   6  Months WITH DR Haywood Filler will receive a reminder letter in the mail two months in advance. If you don't receive a letter, please call our office to schedule the follow-up appointment. Your physician has recommended you make the following change in your medication: STOP DILTIAZEM AND  INCREASE LOSARTAN TO  100 MG  EVERY DAY

## 2013-05-18 NOTE — Assessment & Plan Note (Signed)
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

## 2013-05-24 ENCOUNTER — Encounter (HOSPITAL_COMMUNITY): Payer: Self-pay | Admitting: Pharmacy Technician

## 2013-05-24 NOTE — Patient Instructions (Signed)
KHALIF STENDER  05/24/2013   Your procedure is scheduled on:  06/01/13               Surgery 200pm-300pm  Report to [facility] at    1130 AM.  Call this number if you have problems the morning of surgery: [phone]   Remember:   Do not eat food after midnite.               May have clear liquids until 0730am then npo.    Take these medicines the morning of surgery with A SIP OF WATER:    Do not wear jewelry  Do not wear lotions, powders, or perfumes.   . Men may shave face and neck.  Do not bring valuables to the hospital.  Contacts, dentures or bridgework may not be worn into surgery.  .   Patients discharged the day of surgery will not be allowed to drive  home.  Name and phone number of your driver:    SEE CHG INSTRUCTION SHEET    Please read over the following fact sheets that you were given: MRSA Information, coughing and deep breathing exercises, leg exercises               Failure to comply with these instructions may result in cancellation of your surgery.                Patient Signature ____________________________              Nurse Signature _____________________________

## 2013-05-25 ENCOUNTER — Encounter (HOSPITAL_COMMUNITY): Payer: Self-pay

## 2013-05-25 ENCOUNTER — Encounter (HOSPITAL_COMMUNITY)
Admission: RE | Admit: 2013-05-25 | Discharge: 2013-05-25 | Disposition: A | Discharge status: Home / Self Care | Payer: Medicare Other | Source: Ambulatory Visit | Attending: Urology | Admitting: Urology

## 2013-05-25 ENCOUNTER — Ambulatory Visit (INDEPENDENT_AMBULATORY_CARE_PROVIDER_SITE_OTHER): Payer: Medicare Other

## 2013-05-25 DIAGNOSIS — D494 Neoplasm of unspecified behavior of bladder: Secondary | ICD-10-CM | POA: Insufficient documentation

## 2013-05-25 DIAGNOSIS — Z01812 Encounter for preprocedural laboratory examination: Secondary | ICD-10-CM | POA: Insufficient documentation

## 2013-05-25 DIAGNOSIS — J309 Allergic rhinitis, unspecified: Secondary | ICD-10-CM

## 2013-05-25 DIAGNOSIS — D4959 Neoplasm of unspecified behavior of other genitourinary organ: Secondary | ICD-10-CM | POA: Insufficient documentation

## 2013-05-25 HISTORY — DX: Fibromyalgia: M79.7

## 2013-05-25 HISTORY — DX: Visual discomfort, unspecified: H53.149

## 2013-05-25 LAB — CBC
HCT: 43.3 % (ref 39.0–52.0)
Hemoglobin: 15 g/dL (ref 13.0–17.0)
MCH: 32.1 pg (ref 26.0–34.0)
MCHC: 34.6 g/dL (ref 30.0–36.0)
MCV: 92.7 fL (ref 78.0–100.0)
Platelets: 186 10*3/uL (ref 150–400)
RBC: 4.67 MIL/uL (ref 4.22–5.81)
RDW: 12.5 % (ref 11.5–15.5)
WBC: 8.1 10*3/uL (ref 4.0–10.5)

## 2013-05-25 LAB — COMPREHENSIVE METABOLIC PANEL
ALT: 60 U/L — ABNORMAL HIGH (ref 0–53)
AST: 39 U/L — ABNORMAL HIGH (ref 0–37)
Albumin: 3.7 g/dL (ref 3.5–5.2)
Alkaline Phosphatase: 58 U/L (ref 39–117)
BUN: 8 mg/dL (ref 6–23)
CO2: 28 mEq/L (ref 19–32)
Calcium: 9.5 mg/dL (ref 8.4–10.5)
Chloride: 103 mEq/L (ref 96–112)
Creatinine, Ser: 1.02 mg/dL (ref 0.50–1.35)
GFR calc Af Amer: >90 mL/min (ref >90)
GFR calc non Af Amer: 78 mL/min — ABNORMAL LOW (ref >90)
Glucose, Bld: 111 mg/dL — ABNORMAL HIGH (ref 70–99)
Potassium: 4.3 mEq/L (ref 3.5–5.1)
Sodium: 138 mEq/L (ref 135–145)
Total Bilirubin: 0.4 mg/dL (ref 0.3–1.2)
Total Protein: 6.6 g/dL (ref 6.0–8.3)

## 2013-05-25 NOTE — Progress Notes (Signed)
Last office visit with Dr Melba Coon 01/17/13 in [facility] Last office visit with Dr Eden Emms 05/18/13 in [facility]

## 2013-05-25 NOTE — Progress Notes (Signed)
Health Care POA on chart.

## 2013-05-31 ENCOUNTER — Telehealth: Payer: Self-pay | Admitting: Internal Medicine

## 2013-05-31 MED ORDER — BUDESONIDE-FORMOTEROL FUMARATE 160-4.5 MCG/ACT IN AERO: 2.0000 | INHALATION_SPRAY | Freq: Two times a day (BID) | RESPIRATORY_TRACT | Status: DC

## 2013-05-31 NOTE — Telephone Encounter (Signed)
Last ov 3.31.14 w/ TP: Patient Instructions    Finish Doxycycline .  I will call with xray results.  Follow up Dr. Maple Hudson As planned and As needed    Symbicort was last refilled 03/2013 but to a pharmacy in [facility] [place] Wife had come to the office requesting refills to the pharmacy above Wife advised refills sent to pharmacy

## 2013-06-01 ENCOUNTER — Encounter (HOSPITAL_COMMUNITY)
Admission: RE | Disposition: A | Discharge status: Home / Self Care | Payer: Self-pay | Source: Ambulatory Visit | Attending: Urology

## 2013-06-01 ENCOUNTER — Ambulatory Visit: Payer: Medicare Other

## 2013-06-01 ENCOUNTER — Ambulatory Visit (HOSPITAL_COMMUNITY)
Admission: RE | Admit: 2013-06-01 | Discharge: 2013-06-01 | Disposition: A | Discharge status: Home / Self Care | Payer: Medicare Other | Source: Ambulatory Visit | Attending: Urology | Admitting: Urology

## 2013-06-01 ENCOUNTER — Encounter (HOSPITAL_COMMUNITY): Payer: Self-pay | Admitting: *Deleted

## 2013-06-01 DIAGNOSIS — D494 Neoplasm of unspecified behavior of bladder: Secondary | ICD-10-CM | POA: Insufficient documentation

## 2013-06-01 DIAGNOSIS — Z7901 Long term (current) use of anticoagulants: Secondary | ICD-10-CM | POA: Insufficient documentation

## 2013-06-01 DIAGNOSIS — Z5309 Procedure and treatment not carried out because of other contraindication: Secondary | ICD-10-CM | POA: Insufficient documentation

## 2013-06-01 SURGERY — CYSTOSCOPY, WITH RETROGRADE PYELOGRAM
Anesthesia: General

## 2013-06-01 MED ORDER — DEXTROSE 5 % IV SOLN
3.0000 g | INTRAVENOUS | Status: DC
  Filled 2013-06-01: qty 3000

## 2013-06-01 NOTE — Progress Notes (Addendum)
This patient did not stop his xeralto as instructed. His last dose was yesterday. His surgery was cancelled and will have to be rescheduled.

## 2013-06-01 NOTE — Anesthesia Preprocedure Evaluation (Addendum)
Anesthesia Evaluation  Patient identified by MRN, date of birth, ID band Patient awake    Reviewed: Allergy & Precautions, H&P , NPO status , Patient's Chart, lab work & pertinent test results, reviewed documented beta blocker date and time   Airway Mallampati: II TM Distance: >3 FB Neck ROM: full    Dental  (+) Dental Advisory Given and Implants Implant left upper front:   Pulmonary asthma , sleep apnea and Continuous Positive Airway Pressure Ventilation , COPD COPD inhaler, former smoker,  History chronic bronchitis breath sounds clear to auscultation  Pulmonary exam normal       Cardiovascular Exercise Tolerance: Good hypertension, Pt. on medications and Pt. on home beta blockers + dysrhythmias Atrial Fibrillation Rhythm:regular Rate:Normal  ECG - NSR   Neuro/Psych  Headaches, PSYCHIATRIC DISORDERS Depression History toxic encephalopathy with cognitive dysfunction negative neurological ROS  negative psych ROS   GI/Hepatic negative GI ROS, Neg liver ROS, GERD-  Medicated,  Endo/Other  Hypothyroidism Morbid obesity  Renal/GU negative Renal ROS  negative genitourinary   Musculoskeletal  (+) Fibromyalgia -  Abdominal (+) + obese,   Peds  Hematology negative hematology ROS (+)   Anesthesia Other Findings   Reproductive/Obstetrics negative OB ROS                        Anesthesia Physical Anesthesia Plan  ASA: III  Anesthesia Plan: General   Post-op Pain Management:    Induction: Intravenous  Airway Management Planned: LMA  Additional Equipment:   Intra-op Plan:   Post-operative Plan: Extubation in OR  Informed Consent: I have reviewed the patients History and Physical, chart, labs and discussed the procedure including the risks, benefits and alternatives for the proposed anesthesia with the patient or authorized representative who has indicated his/her understanding and acceptance.    Dental advisory given  Plan Discussed with: CRNA and Surgeon  Anesthesia Plan Comments:        Anesthesia Quick Evaluation

## 2013-06-01 NOTE — Progress Notes (Signed)
Notified Dr. Margarita Grizzle of patient taking Xarelto yesterday (05/31/13) and patient becoming anxious waiting for procedure to start. Dr. Margarita Grizzle still in surgery and unable to see patient at this time. Patient was informed and surgery will be canceled and rescheduled per Dr. Margarita Grizzle.

## 2013-06-02 ENCOUNTER — Other Ambulatory Visit: Payer: Self-pay | Admitting: Urology

## 2013-06-03 ENCOUNTER — Ambulatory Visit (INDEPENDENT_AMBULATORY_CARE_PROVIDER_SITE_OTHER): Payer: Medicare Other

## 2013-06-03 DIAGNOSIS — J309 Allergic rhinitis, unspecified: Secondary | ICD-10-CM

## 2013-06-07 ENCOUNTER — Encounter (HOSPITAL_BASED_OUTPATIENT_CLINIC_OR_DEPARTMENT_OTHER): Payer: Self-pay | Admitting: *Deleted

## 2013-06-08 ENCOUNTER — Other Ambulatory Visit: Payer: Self-pay | Admitting: *Deleted

## 2013-06-08 ENCOUNTER — Encounter (HOSPITAL_BASED_OUTPATIENT_CLINIC_OR_DEPARTMENT_OTHER): Payer: Self-pay | Admitting: *Deleted

## 2013-06-08 ENCOUNTER — Ambulatory Visit (INDEPENDENT_AMBULATORY_CARE_PROVIDER_SITE_OTHER): Payer: Medicare Other

## 2013-06-08 DIAGNOSIS — J309 Allergic rhinitis, unspecified: Secondary | ICD-10-CM

## 2013-06-08 MED ORDER — RIVAROXABAN 20 MG PO TABS: 20.0000 mg | ORAL_TABLET | Freq: Every day | ORAL | Status: DC

## 2013-06-08 NOTE — Progress Notes (Signed)
Spoke with wife-To [facility] at 0930 - Istat on arrival,Ekg with chart,Cxr in epic-instructed Npo after Mn-to take prilosec,synthroid,cytomel,symbicort,xanax that am with small amt water only-will bring albuterol inhaler and CPAP machine and mask.

## 2013-06-10 ENCOUNTER — Ambulatory Visit: Payer: Medicare Other

## 2013-06-14 ENCOUNTER — Ambulatory Visit (INDEPENDENT_AMBULATORY_CARE_PROVIDER_SITE_OTHER): Payer: Medicare Other

## 2013-06-14 DIAGNOSIS — J309 Allergic rhinitis, unspecified: Secondary | ICD-10-CM

## 2013-06-15 ENCOUNTER — Ambulatory Visit (HOSPITAL_COMMUNITY): Payer: Medicare Other

## 2013-06-15 ENCOUNTER — Encounter (HOSPITAL_BASED_OUTPATIENT_CLINIC_OR_DEPARTMENT_OTHER): Payer: Self-pay | Admitting: Anesthesiology

## 2013-06-15 ENCOUNTER — Ambulatory Visit (HOSPITAL_BASED_OUTPATIENT_CLINIC_OR_DEPARTMENT_OTHER)
Admission: RE | Admit: 2013-06-15 | Discharge: 2013-06-15 | Disposition: A | Discharge status: Home / Self Care | Payer: Medicare Other | Source: Ambulatory Visit | Attending: Urology | Admitting: Urology

## 2013-06-15 ENCOUNTER — Encounter (HOSPITAL_BASED_OUTPATIENT_CLINIC_OR_DEPARTMENT_OTHER)
Admission: RE | Disposition: A | Discharge status: Home / Self Care | Payer: Self-pay | Source: Ambulatory Visit | Attending: Urology

## 2013-06-15 ENCOUNTER — Ambulatory Visit: Payer: Medicare Other

## 2013-06-15 ENCOUNTER — Encounter (HOSPITAL_BASED_OUTPATIENT_CLINIC_OR_DEPARTMENT_OTHER): Payer: Self-pay | Admitting: *Deleted

## 2013-06-15 ENCOUNTER — Ambulatory Visit (HOSPITAL_BASED_OUTPATIENT_CLINIC_OR_DEPARTMENT_OTHER): Payer: Medicare Other | Admitting: Anesthesiology

## 2013-06-15 DIAGNOSIS — N3080 Other cystitis without hematuria: Secondary | ICD-10-CM | POA: Insufficient documentation

## 2013-06-15 DIAGNOSIS — I10 Essential (primary) hypertension: Secondary | ICD-10-CM | POA: Insufficient documentation

## 2013-06-15 DIAGNOSIS — G4733 Obstructive sleep apnea (adult) (pediatric): Secondary | ICD-10-CM | POA: Insufficient documentation

## 2013-06-15 DIAGNOSIS — E039 Hypothyroidism, unspecified: Secondary | ICD-10-CM | POA: Insufficient documentation

## 2013-06-15 DIAGNOSIS — IMO0001 Reserved for inherently not codable concepts without codable children: Secondary | ICD-10-CM | POA: Insufficient documentation

## 2013-06-15 DIAGNOSIS — J449 Chronic obstructive pulmonary disease, unspecified: Secondary | ICD-10-CM | POA: Insufficient documentation

## 2013-06-15 DIAGNOSIS — R319 Hematuria, unspecified: Secondary | ICD-10-CM

## 2013-06-15 DIAGNOSIS — K219 Gastro-esophageal reflux disease without esophagitis: Secondary | ICD-10-CM | POA: Insufficient documentation

## 2013-06-15 DIAGNOSIS — Z79899 Other long term (current) drug therapy: Secondary | ICD-10-CM | POA: Insufficient documentation

## 2013-06-15 DIAGNOSIS — J4489 Other specified chronic obstructive pulmonary disease: Secondary | ICD-10-CM | POA: Insufficient documentation

## 2013-06-15 DIAGNOSIS — N329 Bladder disorder, unspecified: Secondary | ICD-10-CM

## 2013-06-15 HISTORY — DX: Paroxysmal atrial fibrillation: I48.0

## 2013-06-15 HISTORY — DX: Chronic fatigue, unspecified: R53.82

## 2013-06-15 HISTORY — DX: Hypothyroidism, unspecified: E03.9

## 2013-06-15 HISTORY — DX: Personal history of other diseases of the nervous system and sense organs: Z86.69

## 2013-06-15 HISTORY — DX: Obstructive sleep apnea (adult) (pediatric): G47.33

## 2013-06-15 HISTORY — DX: Allergic rhinitis, unspecified: J30.9

## 2013-06-15 HISTORY — DX: Anxiety disorder, unspecified: F41.9

## 2013-06-15 HISTORY — DX: Depression, unspecified: F32.A

## 2013-06-15 HISTORY — DX: Personal history of other diseases of the respiratory system: Z87.09

## 2013-06-15 HISTORY — DX: Dependence on other enabling machines and devices: Z99.89

## 2013-06-15 HISTORY — DX: Neoplasm of unspecified behavior of other genitourinary organ: D49.59

## 2013-06-15 HISTORY — DX: Neoplasm of unspecified behavior of bladder: D49.4

## 2013-06-15 HISTORY — DX: Major depressive disorder, single episode, unspecified: F32.9

## 2013-06-15 HISTORY — DX: Gastro-esophageal reflux disease without esophagitis: K21.9

## 2013-06-15 HISTORY — PX: CYSTOSCOPY W/ RETROGRADES: SHX1426

## 2013-06-15 LAB — POCT I-STAT 4, (NA,K, GLUC, HGB,HCT)
Glucose, Bld: 110 mg/dL — ABNORMAL HIGH (ref 70–99)
HCT: 43 % (ref 39.0–52.0)
Hemoglobin: 14.6 g/dL (ref 13.0–17.0)
Potassium: 3.8 mEq/L (ref 3.5–5.1)
Sodium: 143 mEq/L (ref 135–145)

## 2013-06-15 SURGERY — CYSTOSCOPY, WITH RETROGRADE PYELOGRAM
Anesthesia: General | Site: Bladder | Laterality: Bilateral

## 2013-06-15 MED ORDER — BELLADONNA ALKALOIDS-OPIUM 16.2-60 MG RE SUPP
RECTAL | Status: DC | PRN
  Administered 2013-06-15: 1 via RECTAL

## 2013-06-15 MED ORDER — LACTATED RINGERS IV SOLN
INTRAVENOUS | Status: DC
  Administered 2013-06-15 (×2): via INTRAVENOUS
  Filled 2013-06-15: qty 1000

## 2013-06-15 MED ORDER — KETOROLAC TROMETHAMINE 30 MG/ML IJ SOLN
15.0000 mg | Freq: Once | INTRAMUSCULAR | Status: DC | PRN
  Filled 2013-06-15: qty 1

## 2013-06-15 MED ORDER — PHENAZOPYRIDINE HCL 100 MG PO TABS: 100.0000 mg | ORAL_TABLET | Freq: Three times a day (TID) | ORAL | Status: DC | PRN

## 2013-06-15 MED ORDER — STERILE WATER FOR IRRIGATION IR SOLN
Status: DC | PRN
  Administered 2013-06-15: 10 mL

## 2013-06-15 MED ORDER — DEXTROSE 5 % IV SOLN
3.0000 g | INTRAVENOUS | Status: AC
  Administered 2013-06-15: 3 g via INTRAVENOUS
  Filled 2013-06-15: qty 3000

## 2013-06-15 MED ORDER — PHENAZOPYRIDINE HCL 100 MG PO TABS
100.0000 mg | ORAL_TABLET | Freq: Three times a day (TID) | ORAL | Status: DC
  Administered 2013-06-15: 100 mg via ORAL
  Filled 2013-06-15: qty 1

## 2013-06-15 MED ORDER — FENTANYL CITRATE 0.05 MG/ML IJ SOLN
25.0000 ug | INTRAMUSCULAR | Status: DC | PRN
  Filled 2013-06-15: qty 1

## 2013-06-15 MED ORDER — SODIUM CHLORIDE 0.9 % IR SOLN
Status: DC | PRN
  Administered 2013-06-15: 12000 mL

## 2013-06-15 MED ORDER — LIDOCAINE HCL (CARDIAC) 20 MG/ML IV SOLN
INTRAVENOUS | Status: DC | PRN
  Administered 2013-06-15: 80 mg via INTRAVENOUS

## 2013-06-15 MED ORDER — OXYBUTYNIN CHLORIDE 5 MG PO TABS: 5.0000 mg | ORAL_TABLET | Freq: Four times a day (QID) | ORAL | Status: DC | PRN

## 2013-06-15 MED ORDER — PROMETHAZINE HCL 25 MG/ML IJ SOLN
6.2500 mg | INTRAMUSCULAR | Status: DC | PRN
  Filled 2013-06-15: qty 1

## 2013-06-15 MED ORDER — LIDOCAINE HCL 2 % EX GEL
CUTANEOUS | Status: DC | PRN
  Administered 2013-06-15: 1 via URETHRAL

## 2013-06-15 MED ORDER — CEFAZOLIN SODIUM-DEXTROSE 2-3 GM-% IV SOLR
2.0000 g | INTRAVENOUS | Status: DC
  Filled 2013-06-15: qty 50

## 2013-06-15 MED ORDER — PROPOFOL 10 MG/ML IV BOLUS
INTRAVENOUS | Status: DC | PRN
  Administered 2013-06-15: 300 mg via INTRAVENOUS

## 2013-06-15 MED ORDER — CEPHALEXIN 500 MG PO CAPS: 500.0000 mg | ORAL_CAPSULE | Freq: Three times a day (TID) | ORAL | Status: DC

## 2013-06-15 MED ORDER — SENNOSIDES-DOCUSATE SODIUM 8.6-50 MG PO TABS: 1.0000 | ORAL_TABLET | Freq: Two times a day (BID) | ORAL | Status: DC

## 2013-06-15 MED ORDER — OXYBUTYNIN CHLORIDE 5 MG PO TABS
5.0000 mg | ORAL_TABLET | Freq: Three times a day (TID) | ORAL | Status: DC
  Administered 2013-06-15: 5 mg via ORAL
  Filled 2013-06-15: qty 1

## 2013-06-15 MED ORDER — FENTANYL CITRATE 0.05 MG/ML IJ SOLN
INTRAMUSCULAR | Status: DC | PRN
  Administered 2013-06-15 (×2): 50 ug via INTRAVENOUS

## 2013-06-15 MED ORDER — HYDROCODONE-ACETAMINOPHEN 5-325 MG PO TABS: 1.0000 | ORAL_TABLET | ORAL | Status: DC | PRN

## 2013-06-15 MED ORDER — HYDROCODONE-ACETAMINOPHEN 5-325 MG PO TABS
1.0000 | ORAL_TABLET | ORAL | Status: DC | PRN
  Administered 2013-06-15: 1 via ORAL
  Filled 2013-06-15: qty 2

## 2013-06-15 MED ORDER — HYOSCYAMINE SULFATE 0.125 MG PO TABS: 0.1250 mg | ORAL_TABLET | ORAL | Status: DC | PRN

## 2013-06-15 MED ORDER — HYOSCYAMINE SULFATE 0.125 MG SL SUBL
0.1250 mg | SUBLINGUAL_TABLET | SUBLINGUAL | Status: DC | PRN
  Administered 2013-06-15: 0.125 mg via SUBLINGUAL
  Filled 2013-06-15: qty 1

## 2013-06-15 MED ORDER — IOHEXOL 350 MG/ML SOLN
INTRAVENOUS | Status: DC | PRN
  Administered 2013-06-15: 20 mL via INTRAVENOUS

## 2013-06-15 SURGICAL SUPPLY — 44 items
ADAPTER CATH URET PLST 4-6FR (CATHETERS) IMPLANT
ADPR CATH URET STRL DISP 4-6FR (CATHETERS)
BAG DRAIN URO-CYSTO SKYTR STRL (DRAIN) ×2 IMPLANT
BAG DRN UROCATH (DRAIN) ×1
BAG URINE LEG 500ML (DRAIN) ×1 IMPLANT
BASKET LASER NITINOL 1.9FR (BASKET) IMPLANT
BASKET STNLS GEMINI 4WIRE 3FR (BASKET) IMPLANT
BASKET ZERO TIP NITINOL 2.4FR (BASKET) IMPLANT
BRUSH URET BIOPSY 3F (UROLOGICAL SUPPLIES) IMPLANT
BSKT STON RTRVL 120 1.9FR (BASKET)
BSKT STON RTRVL GEM 120X11 3FR (BASKET)
BSKT STON RTRVL ZERO TP 2.4FR (BASKET)
CANISTER SUCT LVC 12 LTR MEDI- (MISCELLANEOUS) ×1 IMPLANT
CATH FOLEY 2WAY SLVR 30CC 22FR (CATHETERS) ×1 IMPLANT
CATH INTERMIT  6FR 70CM (CATHETERS) IMPLANT
CATH URET 5FR 28IN CONE TIP (BALLOONS)
CATH URET 5FR 28IN OPEN ENDED (CATHETERS) ×1 IMPLANT
CATH URET 5FR 70CM CONE TIP (BALLOONS) IMPLANT
CLOTH BEACON ORANGE TIMEOUT ST (SAFETY) ×2 IMPLANT
DRAPE CAMERA CLOSED 9X96 (DRAPES) ×2 IMPLANT
ELECT REM PT RETURN 9FT ADLT (ELECTROSURGICAL)
ELECTRODE REM PT RTRN 9FT ADLT (ELECTROSURGICAL) IMPLANT
GLOVE BIO SURGEON STRL SZ7 (GLOVE) ×2 IMPLANT
GLOVE BIOGEL PI IND STRL 6.5 (GLOVE) IMPLANT
GLOVE BIOGEL PI INDICATOR 6.5 (GLOVE) ×1
GLOVE ECLIPSE 6.5 STRL STRAW (GLOVE) ×1 IMPLANT
GLOVE INDICATOR 7.5 STRL GRN (GLOVE) IMPLANT
GOWN PREVENTION PLUS LG XLONG (DISPOSABLE) ×2 IMPLANT
GUIDEWIRE 0.038 PTFE COATED (WIRE) IMPLANT
GUIDEWIRE ANG ZIPWIRE 038X150 (WIRE) IMPLANT
GUIDEWIRE STR DUAL SENSOR (WIRE) ×1 IMPLANT
IV NS IRRIG 3000ML ARTHROMATIC (IV SOLUTION) ×3 IMPLANT
KIT ASPIRATION TUBING (SET/KITS/TRAYS/PACK) ×1 IMPLANT
KIT BALLIN UROMAX 15FX10 (LABEL) IMPLANT
KIT BALLN UROMAX 15FX4 (MISCELLANEOUS) IMPLANT
KIT BALLN UROMAX 26 75X4 (MISCELLANEOUS)
LASER FIBER DISP (UROLOGICAL SUPPLIES) IMPLANT
PACK CYSTOSCOPY (CUSTOM PROCEDURE TRAY) ×2 IMPLANT
PLUG CATH AND CAP STER (CATHETERS) ×1 IMPLANT
SET HIGH PRES BAL DIL (LABEL)
SHEATH URET ACCESS 12FR/35CM (UROLOGICAL SUPPLIES) IMPLANT
SHEATH URET ACCESS 12FR/55CM (UROLOGICAL SUPPLIES) IMPLANT
SYRINGE IRR TOOMEY STRL 70CC (SYRINGE) IMPLANT
WATER STERILE IRR 1000ML POUR (IV SOLUTION) ×1 IMPLANT
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
## 2013-06-15 NOTE — Op Note (Signed)
Urology Operative Report  Date of Procedure: 06/15/13  Surgeon: Natalia Leatherwood, MD Assistant:  None  Preoperative Diagnosis: Bladder lesion, Prostatic urethral lesion, Hematuria. Postoperative Diagnosis:  Same  Procedure(s): Bladder biopsy. Prostatic urethral biopsy. Bilateral retrograde pyelograms with interpretation.  Estimated blood loss: Minimal  Specimen: Bladder biopsy x2, prostatic urethral biopsy.  Drains: 22 French three-way Foley catheter with 10 cc of sterile water in the balloon.  Complications: None  Findings: Erythema of the bladder floor. Telangiectasia throughout the bladder more consistent with inflammation. Negative involvement of ureter orifices bilaterally. Negative filling defects or hydronephrosis on retrograde pyelogram's. Small papillary lesion in the prostatic urethra. Smooth, heaped up lesion in the bladder at the trigone medial to the left ureter orifice.  History of present illness: 62 year old male presented with hematuria and was found to have a lesion in his bladder and prostatic urethra during office cystoscopy. He presents today for biopsy of these sites as well as retrograde pyelograms.   Procedure in detail: After informed consent was obtained, the patient was taken to the operating room. They were placed in the supine position. SCDs were turned on and in place. IV antibiotics were infused, and general anesthesia was induced. A timeout was performed in which the correct patient, surgical site, and procedure were identified and agreed upon by the team.  The patient was placed in a dorsolithotomy position, making sure to pad all pertinent neurovascular pressure points. Digital rectal examination revealed a smooth prostate which is larger than 35 g in size without any distinct nodules. The genitals were prepped and draped in the usual sterile fashion.  A rigid cystoscope was advanced through the urethra and into the bladder. Immediately upon entering  the bladder there was hematuria it appeared to be coming from the prostate. The bladder was drained and then examined in a systematic fashion with a 12 and 70 lens to visualize the entire surface of the bladder. There was noted be, education throughout the bladder. There was a heaped up, smooth lesion medial to the left ureter orifice but did not involve the orifice. There was also erythema on the posterior bladder trigone. I also noted a frondular papillary lesion less than once the meter in size in the prostatic urethra.  The visual obturator to the gyrus dissector scope was placed and then resection was carried out in normal saline to resect the small lesion medial to the left ureter orifice. I was able to avoid any injury to the left ureter orifice and this area was fulgurated. The small lesion was sent to pathology. Cold cup biopsy forcep was then used to biopsy the posterior bladder floor and this area was fulgurated with the gyrus resectoscope. I then turned my attention to the prosthetic urethra. The frondular area was proximal to the external sphincter. This was sampled with the cold cup biopsy forcep and limited fulguration was carried out here to avoid any injury to the urethra.  I then obtained bilateral retrograde pyelograms by cannulating each ureter orifice with a 5 [place] ureter catheter and injecting contrast. There were no filling defects or hydronephrosis bilaterally. Each side indeed out well.  This completed the procedure. Because of the bleeding from the prostatic urethra due to manipulation I felt it would be appropriate to place a Foley catheter. I placed 10 cc of lidocaine jelly into the urethra and then I placed a 22 French three-way Foley catheter with 10 cc of sterile water into the balloon. The irrigation port was plugged. This completed the procedure.  The patient's placed in a supine position, anesthesia was reversed, I placed a B. and O. suppository into his rectum, and he was  taken to the PACU in a stable condition.  His urine cleared to light pink in the PACU and his catheter was removed.  All counts were correct at the end of the case.

## 2013-06-15 NOTE — Progress Notes (Signed)
Cato Mulligan RN instilled NS into bladder via foley cath while clamped. Foley cath D/C @ 1605 by Cato Mulligan RN without complications.

## 2013-06-15 NOTE — Anesthesia Procedure Notes (Signed)
Procedure Name: LMA Insertion Date/Time: 06/15/2013 11:58 AM Performed by: Maris Berger T Pre-anesthesia Checklist: Patient identified, Emergency Drugs available, Suction available and Patient being monitored Patient Re-evaluated:Patient Re-evaluated prior to inductionOxygen Delivery Method: Circle System Utilized Preoxygenation: Pre-oxygenation with 100% oxygen Intubation Type: IV induction Ventilation: Mask ventilation without difficulty LMA: LMA flexible inserted LMA Size: 5.0 Number of attempts: 1 Placement Confirmation: positive ETCO2 Tube secured with: Tape Dental Injury: Teeth and Oropharynx as per pre-operative assessment

## 2013-06-15 NOTE — Anesthesia Postprocedure Evaluation (Signed)
  Anesthesia Post-op Note  Patient: Grant [name]  Procedure(s) Performed: Procedure(s) (LRB): CYSTOSCOPY WITH BILATERAL RETROGRADE PYELOGRAM  BLADDER BIOPSY, PROSTATIC URETHRAL BIOPSY,  (Bilateral)  Patient Location: PACU  Anesthesia Type: General  Level of Consciousness: awake and alert   Airway and Oxygen Therapy: Patient Spontanous Breathing  Post-op Pain: mild  Post-op Assessment: Post-op Vital signs reviewed, Patient's Cardiovascular Status Stable, Respiratory Function Stable, Patent Airway and No signs of Nausea or vomiting  Last Vitals:  Filed Vitals:   06/15/13 1345  BP: 150/78  Pulse: 52  Temp:   Resp: 15    Post-op Vital Signs: stable   Complications: No apparent anesthesia complications

## 2013-06-15 NOTE — Transfer of Care (Signed)
Immediate Anesthesia Transfer of Care Note  Patient: Grant [name]  Procedure(s) Performed: Procedure(s): CYSTOSCOPY WITH BILATERAL RETROGRADE PYELOGRAM  BLADDER BIOPSY, PROSTATIC URETHRAL BIOPSY,  (Bilateral)  Patient Location: PACU  Anesthesia Type:General  Level of Consciousness: awake, alert  and oriented  Airway & Oxygen Therapy: Patient Spontanous Breathing and Patient connected to nasal cannula oxygen  Post-op Assessment: Report given to PACU RN  Post vital signs: Reviewed and stable  Complications: No apparent anesthesia complications

## 2013-06-16 ENCOUNTER — Encounter (HOSPITAL_BASED_OUTPATIENT_CLINIC_OR_DEPARTMENT_OTHER): Payer: Self-pay | Admitting: Urology

## 2013-06-21 ENCOUNTER — Ambulatory Visit: Payer: Medicare Other

## 2013-06-21 ENCOUNTER — Ambulatory Visit: Payer: Medicare Other | Admitting: Internal Medicine

## 2013-06-22 ENCOUNTER — Ambulatory Visit (INDEPENDENT_AMBULATORY_CARE_PROVIDER_SITE_OTHER): Payer: Medicare Other

## 2013-06-22 DIAGNOSIS — J309 Allergic rhinitis, unspecified: Secondary | ICD-10-CM

## 2013-06-24 ENCOUNTER — Ambulatory Visit: Payer: Medicare Other | Admitting: Internal Medicine

## 2013-06-29 ENCOUNTER — Ambulatory Visit: Payer: Medicare Other

## 2013-07-01 ENCOUNTER — Ambulatory Visit (INDEPENDENT_AMBULATORY_CARE_PROVIDER_SITE_OTHER): Payer: Medicare Other

## 2013-07-01 DIAGNOSIS — J309 Allergic rhinitis, unspecified: Secondary | ICD-10-CM

## 2013-07-11 ENCOUNTER — Ambulatory Visit (INDEPENDENT_AMBULATORY_CARE_PROVIDER_SITE_OTHER): Payer: Medicare Other

## 2013-07-11 DIAGNOSIS — J309 Allergic rhinitis, unspecified: Secondary | ICD-10-CM

## 2013-07-12 ENCOUNTER — Ambulatory Visit (INDEPENDENT_AMBULATORY_CARE_PROVIDER_SITE_OTHER): Payer: Medicare Other

## 2013-07-12 ENCOUNTER — Telehealth: Payer: Self-pay | Admitting: Internal Medicine

## 2013-07-12 ENCOUNTER — Encounter: Payer: Self-pay | Admitting: Internal Medicine

## 2013-07-12 ENCOUNTER — Ambulatory Visit (INDEPENDENT_AMBULATORY_CARE_PROVIDER_SITE_OTHER): Payer: Medicare Other | Admitting: Internal Medicine

## 2013-07-12 VITALS — BP 140/84 | HR 77 | Ht 71.0 in | Wt 277.0 lb

## 2013-07-12 DIAGNOSIS — G4733 Obstructive sleep apnea (adult) (pediatric): Secondary | ICD-10-CM

## 2013-07-12 DIAGNOSIS — R490 Dysphonia: Secondary | ICD-10-CM

## 2013-07-12 DIAGNOSIS — R498 Other voice and resonance disorders: Secondary | ICD-10-CM

## 2013-07-12 DIAGNOSIS — J302 Other seasonal allergic rhinitis: Secondary | ICD-10-CM

## 2013-07-12 DIAGNOSIS — R499 Unspecified voice and resonance disorder: Secondary | ICD-10-CM

## 2013-07-12 DIAGNOSIS — J309 Allergic rhinitis, unspecified: Secondary | ICD-10-CM

## 2013-07-12 NOTE — Patient Instructions (Addendum)
[facility]- [facility]     Dx hoarseness                       - establish new DME to replace [facility] for CPAP 12, mask of choice, humidifier, supplies    Dx OSA  We can continue allergy vaccine 1:10 GH
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
## 2013-07-12 NOTE — Telephone Encounter (Signed)
Called, spoke with pt's wife.  Reports pt's throat hurts, is clearing throat, and has some PND.  Reports this has been going on for a while but is getting progressively worse.  Wife reports pt's PCP recs pt see Dr. Maple Hudson to see if this is allergy related.  We have scheduled pt to see CY today at 2:15pm.  Wife aware and voiced no further questions or concerns at this time.

## 2013-07-15 NOTE — H&P (Signed)
This patient did not stop his xeralto as instructed. His last dose was yesterday. His surgery was cancelled and will have to be rescheduled. 

## 2013-07-20 ENCOUNTER — Ambulatory Visit: Payer: Medicare Other

## 2013-07-21 ENCOUNTER — Ambulatory Visit (INDEPENDENT_AMBULATORY_CARE_PROVIDER_SITE_OTHER): Payer: Medicare Other

## 2013-07-21 DIAGNOSIS — J309 Allergic rhinitis, unspecified: Secondary | ICD-10-CM

## 2013-07-24 DIAGNOSIS — R499 Unspecified voice and resonance disorder: Secondary | ICD-10-CM | POA: Insufficient documentation

## 2013-07-24 NOTE — Assessment & Plan Note (Signed)
He describes recurrent hoarseness and weak voice with easy voice strain. He denies reflux continuing omeprazole twice daily. Reflux remains the most common explanation Plan-refer to ENT for laryngoscopy to evaluate for vocal cord pathology

## 2013-07-24 NOTE — Assessment & Plan Note (Signed)
He continues allergy vaccine at 1:10 without problem. He is not recognizing much postnasal drip or seasonal change recently.

## 2013-07-24 NOTE — Assessment & Plan Note (Signed)
Compliance and control seems good with CPAP 12 but he needs to establish a new DME company since his went out of business. Plan- [facility] re-establish DME for CPAP 12

## 2013-08-23 ENCOUNTER — Ambulatory Visit (INDEPENDENT_AMBULATORY_CARE_PROVIDER_SITE_OTHER): Payer: Medicare Other

## 2013-08-23 DIAGNOSIS — J309 Allergic rhinitis, unspecified: Secondary | ICD-10-CM

## 2013-08-30 ENCOUNTER — Ambulatory Visit: Payer: Medicare Other

## 2013-08-31 ENCOUNTER — Ambulatory Visit (INDEPENDENT_AMBULATORY_CARE_PROVIDER_SITE_OTHER): Payer: Medicare Other

## 2013-08-31 DIAGNOSIS — J309 Allergic rhinitis, unspecified: Secondary | ICD-10-CM

## 2013-09-07 ENCOUNTER — Ambulatory Visit: Payer: Medicare Other

## 2013-09-21 ENCOUNTER — Encounter: Payer: Self-pay | Admitting: Internal Medicine

## 2013-10-05 ENCOUNTER — Ambulatory Visit (INDEPENDENT_AMBULATORY_CARE_PROVIDER_SITE_OTHER): Payer: Medicare Other

## 2013-10-05 DIAGNOSIS — J309 Allergic rhinitis, unspecified: Secondary | ICD-10-CM

## 2013-10-21 ENCOUNTER — Ambulatory Visit (INDEPENDENT_AMBULATORY_CARE_PROVIDER_SITE_OTHER): Payer: Medicare Other

## 2013-10-21 DIAGNOSIS — J309 Allergic rhinitis, unspecified: Secondary | ICD-10-CM

## 2013-10-27 ENCOUNTER — Other Ambulatory Visit: Payer: Self-pay

## 2013-10-27 MED ORDER — LOSARTAN POTASSIUM 50 MG PO TABS: 100.0000 mg | ORAL_TABLET | Freq: Every day | ORAL | Status: DC

## 2013-11-01 ENCOUNTER — Ambulatory Visit (INDEPENDENT_AMBULATORY_CARE_PROVIDER_SITE_OTHER): Payer: Medicare Other

## 2013-11-01 DIAGNOSIS — J309 Allergic rhinitis, unspecified: Secondary | ICD-10-CM

## 2013-11-16 ENCOUNTER — Ambulatory Visit (INDEPENDENT_AMBULATORY_CARE_PROVIDER_SITE_OTHER): Payer: Medicare Other

## 2013-11-16 DIAGNOSIS — J309 Allergic rhinitis, unspecified: Secondary | ICD-10-CM

## 2013-11-24 ENCOUNTER — Ambulatory Visit (INDEPENDENT_AMBULATORY_CARE_PROVIDER_SITE_OTHER): Payer: Medicare Other

## 2013-11-24 DIAGNOSIS — J309 Allergic rhinitis, unspecified: Secondary | ICD-10-CM

## 2013-11-30 ENCOUNTER — Ambulatory Visit: Payer: Medicare Other

## 2013-12-07 ENCOUNTER — Ambulatory Visit (INDEPENDENT_AMBULATORY_CARE_PROVIDER_SITE_OTHER): Payer: Medicare Other

## 2013-12-07 DIAGNOSIS — J309 Allergic rhinitis, unspecified: Secondary | ICD-10-CM

## 2013-12-08 ENCOUNTER — Encounter: Payer: Self-pay | Admitting: Internal Medicine

## 2013-12-08 ENCOUNTER — Ambulatory Visit (INDEPENDENT_AMBULATORY_CARE_PROVIDER_SITE_OTHER): Payer: Medicare Other | Admitting: Internal Medicine

## 2013-12-08 VITALS — BP 158/80 | HR 77 | Ht 71.0 in | Wt 292.0 lb

## 2013-12-08 DIAGNOSIS — G4733 Obstructive sleep apnea (adult) (pediatric): Secondary | ICD-10-CM

## 2013-12-08 DIAGNOSIS — L309 Dermatitis, unspecified: Secondary | ICD-10-CM

## 2013-12-08 DIAGNOSIS — R21 Rash and other nonspecific skin eruption: Secondary | ICD-10-CM

## 2013-12-08 DIAGNOSIS — L259 Unspecified contact dermatitis, unspecified cause: Secondary | ICD-10-CM

## 2013-12-08 MED ORDER — METHYLPREDNISOLONE ACETATE 80 MG/ML IJ SUSP
80.0000 mg | Freq: Once | INTRAMUSCULAR | Status: AC
  Administered 2013-12-08: 80 mg via INTRAMUSCULAR

## 2013-12-08 NOTE — Patient Instructions (Signed)
Treat the rash as an eczema- use a skin moisturizer like Lubriderm, Eucerin, Cetaphil  Depo 80  We can continue CPAP 12/ [facility]  Please call as needed
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
## 2013-12-14 ENCOUNTER — Ambulatory Visit: Payer: Medicare Other

## 2013-12-21 ENCOUNTER — Ambulatory Visit (INDEPENDENT_AMBULATORY_CARE_PROVIDER_SITE_OTHER): Payer: Medicare Other

## 2013-12-21 DIAGNOSIS — J309 Allergic rhinitis, unspecified: Secondary | ICD-10-CM

## 2013-12-23 ENCOUNTER — Encounter: Payer: Self-pay | Admitting: Cardiovascular Disease

## 2013-12-28 ENCOUNTER — Ambulatory Visit: Payer: Medicare Other

## 2013-12-30 ENCOUNTER — Ambulatory Visit (INDEPENDENT_AMBULATORY_CARE_PROVIDER_SITE_OTHER): Payer: Medicare Other

## 2013-12-30 DIAGNOSIS — J309 Allergic rhinitis, unspecified: Secondary | ICD-10-CM

## 2014-01-01 DIAGNOSIS — L309 Dermatitis, unspecified: Secondary | ICD-10-CM | POA: Insufficient documentation

## 2014-01-01 NOTE — Assessment & Plan Note (Signed)
Plan-skin moisturizer like eucerin, Depo-Medrol today

## 2014-01-01 NOTE — Assessment & Plan Note (Signed)
Good compliance and control 

## 2014-01-02 ENCOUNTER — Other Ambulatory Visit: Payer: Self-pay | Admitting: *Deleted

## 2014-01-02 MED ORDER — METOPROLOL SUCCINATE ER 100 MG PO TB24: 100.0000 mg | ORAL_TABLET | Freq: Every evening | ORAL | Status: DC

## 2014-01-03 ENCOUNTER — Telehealth: Payer: Self-pay | Admitting: *Deleted

## 2014-01-03 NOTE — Telephone Encounter (Signed)
PT  SCHEDULED FOR   COLONOSCOPY  ON  MARCH   24  NEED  TO  STOP  XARELTO AND  NEED  RESTART  DATE  WILL FORWARD  TO DR [name]  FOR  REVIEW./CY

## 2014-01-03 NOTE — Telephone Encounter (Signed)
Stop xarelto 2 days before colon  GI doctor to decide when to resume  Right away if no biopsy or delayed if biopsy

## 2014-01-04 ENCOUNTER — Ambulatory Visit: Payer: Medicare Other

## 2014-01-04 NOTE — Telephone Encounter (Signed)
[place]   PHONE  NOTE  FAXED  TO  GI./CY

## 2014-01-11 ENCOUNTER — Ambulatory Visit: Payer: Medicare Other | Admitting: Internal Medicine

## 2014-01-11 ENCOUNTER — Ambulatory Visit (INDEPENDENT_AMBULATORY_CARE_PROVIDER_SITE_OTHER): Payer: Medicare Other

## 2014-01-11 DIAGNOSIS — J309 Allergic rhinitis, unspecified: Secondary | ICD-10-CM

## 2014-01-17 ENCOUNTER — Ambulatory Visit (INDEPENDENT_AMBULATORY_CARE_PROVIDER_SITE_OTHER): Payer: Medicare Other | Admitting: Cardiovascular Disease

## 2014-01-17 ENCOUNTER — Encounter: Payer: Self-pay | Admitting: Cardiovascular Disease

## 2014-01-17 ENCOUNTER — Ambulatory Visit (INDEPENDENT_AMBULATORY_CARE_PROVIDER_SITE_OTHER): Payer: Medicare Other

## 2014-01-17 VITALS — BP 130/92 | HR 80 | Ht 70.0 in | Wt 284.0 lb

## 2014-01-17 DIAGNOSIS — I4891 Unspecified atrial fibrillation: Secondary | ICD-10-CM

## 2014-01-17 DIAGNOSIS — I480 Paroxysmal atrial fibrillation: Secondary | ICD-10-CM

## 2014-01-17 DIAGNOSIS — J309 Allergic rhinitis, unspecified: Secondary | ICD-10-CM

## 2014-01-17 MED ORDER — METOPROLOL SUCCINATE ER 100 MG PO TB24: 100.0000 mg | ORAL_TABLET | Freq: Every evening | ORAL | Status: DC

## 2014-01-17 MED ORDER — RIVAROXABAN 20 MG PO TABS: 20.0000 mg | ORAL_TABLET | Freq: Every day | ORAL | Status: DC

## 2014-01-17 NOTE — Assessment & Plan Note (Signed)
Well controlled.  Continue current medications and low sodium Dash type diet.    

## 2014-01-17 NOTE — Assessment & Plan Note (Signed)
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

## 2014-01-17 NOTE — Patient Instructions (Signed)
Your physician wants you to follow-up in: YEAR WITH DR NISHAN  You will receive a reminder letter in the mail two months in advance. If you don't receive a letter, please call our office to schedule the follow-up appointment.  Your physician recommends that you continue on your current medications as directed. Please refer to the Current Medication list given to you today. 

## 2014-01-17 NOTE — Progress Notes (Signed)
Patient ID: Grant [name], male   DOB: [birth year], 63 y.o.   MRN: [id number] 63 y.o. referred by Dr Jasmine December for preop clearance. [place] Reviewed records from [facility]. Afib converted fairly quickly and echo with nomral EF. Has been on xarelto since. No history of CAD, chest pain or previous anesthetic issues. 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 He and his wife do not seem to think this is a problem Can walk 2 miles without issue. Seems to have a slow affect. 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  All other systems reviewed and negative  General: Affect appropriate Healthy:  appears stated age 63: normal Neck supple with no adenopathy JVP normal no bruits no thyromegaly Lungs clear with no wheezing and good diaphragmatic motion Heart:  S1/S2 no murmur, no rub, gallop or click PMI normal Abdomen: benighn, BS positve, no tenderness, no AAA no bruit.  No HSM or HJR Distal pulses intact with no bruits No edema Neuro non-focal Skin warm and dry No muscular weakness   Current Outpatient Prescriptions  Medication Sig Dispense Refill  . albuterol (PROVENTIL HFA;VENTOLIN HFA) 108 (90 BASE) MCG/ACT inhaler Inhale 2 puffs into the lungs every 6 (six) hours as needed for wheezing or shortness of breath.      . ALPRAZolam (XANAX) 0.5 MG tablet Take 0.5 mg by mouth 2 (two) times daily as needed for anxiety.       Marland Kitchen ascorbic acid (C-1000/ROSE HIPS) 1000 MG tablet Take 1,000 mg by mouth daily.      Marland Kitchen azelastine (ASTELIN) 137  MCG/SPRAY nasal spray Place 1-2 sprays into the nose 2 (two) times daily. Use in each nostril as directed      . azithromycin (ZITHROMAX) 500 MG tablet as needed.       . benzonatate (TESSALON) 100 MG capsule Take 100-200 mg by mouth as directed. Take 1 to 2 capsules every 3 to 4 hours as needed      . budesonide-formoterol (SYMBICORT) 160-4.5 MCG/ACT inhaler Inhale 2 puffs into the lungs 2 (two) times daily.  1 Inhaler  5  . Cholecalciferol (VITAMIN D-3) 5000 UNITS TABS Take 1 tablet by mouth 2 (two) times daily.      Marland Kitchen COLCRYS 0.6 MG tablet Take 0.6 mg by mouth every morning. Once a day      . dextromethorphan (DELSYM) 30 MG/5ML liquid Take 30 mg by mouth at bedtime. As needed for coughing      . fexofenadine (ALLEGRA) 180 MG tablet Take 180 mg by mouth daily.      . finasteride (PROSCAR) 5 MG tablet Take 5 mg by mouth daily.       Marland Kitchen levothyroxine (SYNTHROID, LEVOTHROID) 112 MCG tablet Take 112 mcg by mouth daily before breakfast.       . liothyronine (CYTOMEL) 25 MCG tablet Take 25 mcg by mouth every morning.       Marland Kitchen losartan (COZAAR) 50 MG tablet Take 2 tablets (100 mg total) by mouth daily.  60 tablet  6  . metoprolol succinate (TOPROL-XL) 100 MG 24 hr tablet Take  1 tablet (100 mg total) by mouth every evening. Take with or immediately following a meal.  30 tablet  0  . neomycin-polymyxin-hydrocortisone (CORTISPORIN) 3.5-10000-1 otic suspension Place 4 drops in ear(s) 3 (three) times daily. When ear is infected      . omeprazole (PRILOSEC) 40 MG capsule Take 40 mg by mouth 2 (two) times daily.        Marland Kitchen perphenazine (TRILAFON) 2 MG tablet Take 2 mg by mouth 2 (two) times daily as needed. As needed      . Probiotic Product (ALIGN) 4 MG CAPS Take 1 capsule by mouth daily.        . Rivaroxaban (XARELTO) 20 MG TABS tablet Take 1 tablet (20 mg total) by mouth daily. Patient takes in evening  30 tablet  5  . tamsulosin (FLOMAX) 0.4 MG CAPS capsule Take 0.4 mg by mouth daily after supper.      .  testosterone cypionate (DEPOTESTOTERONE CYPIONATE) 100 MG/ML injection Inject 200 mg into the muscle every 7 (seven) days. For IM use only      . venlafaxine (EFFEXOR-XR) 150 MG 24 hr capsule Take 150 mg by mouth every morning.       . vitamin B-12 (CYANOCOBALAMIN) 1000 MCG tablet Take 1,000 mcg by mouth daily.         No current facility-administered medications for this visit.    Allergies  Codeine  Electrocardiogram:  Assessment and Plan

## 2014-01-24 ENCOUNTER — Ambulatory Visit: Payer: Medicare Other

## 2014-02-08 ENCOUNTER — Telehealth: Payer: Self-pay | Admitting: Internal Medicine

## 2014-02-08 DIAGNOSIS — G4733 Obstructive sleep apnea (adult) (pediatric): Secondary | ICD-10-CM

## 2014-02-09 ENCOUNTER — Other Ambulatory Visit: Payer: Self-pay | Admitting: Cardiovascular Disease

## 2014-02-09 ENCOUNTER — Telehealth: Payer: Self-pay | Admitting: Internal Medicine

## 2014-02-09 NOTE — Telephone Encounter (Signed)
[name] states that [facility] in [place], [place] ph# [phone] & fax# is [phone] is the DME she would like to use to order cpap-ResMed CPAP w/ auto humidifier w/ a heated hose & swift fx nasal pillow.  Unsure if that particular cpap is in stock, but they do have cpaps in stock.  [employer].  Pt's spouse states they will pay out of pocket if encountering problems w/ insurance covering cost.      Satira Anis

## 2014-02-09 NOTE — Telephone Encounter (Signed)
Per CY-okay to place order to [facility] to work on getting patient on CPAP through [facility] in [place], [place]. Order marked as URGENT. Wife is aware of order taken care of.

## 2014-02-09 NOTE — Addendum Note (Signed)
Addended by: Clayborne Dana C on: 02/09/2014 11:30 AM   Modules accepted: Orders

## 2014-02-09 NOTE — Telephone Encounter (Signed)
Spoke with Dolly Rias states she faxed all current OV notes. She called [facility] and spoke with Estill Bamberg and she confirmed that she did have all the notes and would send to the new DME there at [facility]. Estill Bamberg was to call the wife as well and let her know. I called the wife and explained this to her and she is aware it is being handled. Nothing more needed.
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
## 2014-02-09 NOTE — Telephone Encounter (Signed)
Pt wife returning call [phone].Grant [name]

## 2014-02-13 ENCOUNTER — Other Ambulatory Visit: Payer: Self-pay | Admitting: *Deleted

## 2014-02-13 MED ORDER — LOSARTAN POTASSIUM 50 MG PO TABS: 100.0000 mg | ORAL_TABLET | Freq: Every day | ORAL | Status: DC

## 2014-02-15 ENCOUNTER — Ambulatory Visit (INDEPENDENT_AMBULATORY_CARE_PROVIDER_SITE_OTHER): Payer: Medicare Other

## 2014-02-15 DIAGNOSIS — J309 Allergic rhinitis, unspecified: Secondary | ICD-10-CM

## 2014-02-24 ENCOUNTER — Ambulatory Visit (INDEPENDENT_AMBULATORY_CARE_PROVIDER_SITE_OTHER): Payer: Medicare Other

## 2014-02-24 DIAGNOSIS — J309 Allergic rhinitis, unspecified: Secondary | ICD-10-CM

## 2014-03-03 ENCOUNTER — Ambulatory Visit (INDEPENDENT_AMBULATORY_CARE_PROVIDER_SITE_OTHER): Payer: Medicare Other

## 2014-03-03 DIAGNOSIS — J309 Allergic rhinitis, unspecified: Secondary | ICD-10-CM

## 2014-03-06 ENCOUNTER — Ambulatory Visit (INDEPENDENT_AMBULATORY_CARE_PROVIDER_SITE_OTHER): Payer: Medicare Other

## 2014-03-06 DIAGNOSIS — J309 Allergic rhinitis, unspecified: Secondary | ICD-10-CM

## 2014-03-07 ENCOUNTER — Ambulatory Visit (INDEPENDENT_AMBULATORY_CARE_PROVIDER_SITE_OTHER): Payer: Medicare Other

## 2014-03-07 ENCOUNTER — Other Ambulatory Visit: Payer: Self-pay | Admitting: Internal Medicine

## 2014-03-07 DIAGNOSIS — J309 Allergic rhinitis, unspecified: Secondary | ICD-10-CM

## 2014-03-10 ENCOUNTER — Telehealth: Payer: Self-pay | Admitting: Cardiovascular Disease
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
## 2014-03-10 NOTE — Telephone Encounter (Signed)
New message     Patient wife calling has questions regarding recent dental surgery abscess tooth .   Please advise on current medication should he stop xarelto .  Wife stop his xarelto last night. Gave him aleve.   Preference on taking aleve or ibuporfen.

## 2014-03-14 ENCOUNTER — Ambulatory Visit: Payer: Medicare Other

## 2014-04-17 ENCOUNTER — Ambulatory Visit: Payer: Medicare Other | Admitting: Internal Medicine

## 2014-05-09 ENCOUNTER — Ambulatory Visit (INDEPENDENT_AMBULATORY_CARE_PROVIDER_SITE_OTHER): Payer: Medicare Other

## 2014-05-09 DIAGNOSIS — J309 Allergic rhinitis, unspecified: Secondary | ICD-10-CM

## 2014-05-10 ENCOUNTER — Other Ambulatory Visit: Payer: Self-pay | Admitting: Internal Medicine

## 2014-05-18 ENCOUNTER — Ambulatory Visit (INDEPENDENT_AMBULATORY_CARE_PROVIDER_SITE_OTHER): Payer: Medicare Other

## 2014-05-18 DIAGNOSIS — J309 Allergic rhinitis, unspecified: Secondary | ICD-10-CM

## 2014-05-19 ENCOUNTER — Encounter: Payer: Self-pay | Admitting: Internal Medicine

## 2014-05-31 ENCOUNTER — Ambulatory Visit (INDEPENDENT_AMBULATORY_CARE_PROVIDER_SITE_OTHER): Payer: Medicare Other

## 2014-05-31 DIAGNOSIS — J309 Allergic rhinitis, unspecified: Secondary | ICD-10-CM

## 2014-07-04 ENCOUNTER — Other Ambulatory Visit: Payer: Self-pay | Admitting: Internal Medicine

## 2014-08-03 ENCOUNTER — Ambulatory Visit (INDEPENDENT_AMBULATORY_CARE_PROVIDER_SITE_OTHER): Payer: Medicare Other

## 2014-08-03 DIAGNOSIS — J309 Allergic rhinitis, unspecified: Secondary | ICD-10-CM

## 2014-08-04 ENCOUNTER — Other Ambulatory Visit: Payer: Self-pay

## 2014-08-10 ENCOUNTER — Telehealth: Payer: Self-pay | Admitting: Internal Medicine

## 2014-08-10 ENCOUNTER — Other Ambulatory Visit: Payer: Self-pay | Admitting: Internal Medicine

## 2014-08-10 ENCOUNTER — Ambulatory Visit (INDEPENDENT_AMBULATORY_CARE_PROVIDER_SITE_OTHER): Payer: Medicare Other

## 2014-08-10 DIAGNOSIS — J309 Allergic rhinitis, unspecified: Secondary | ICD-10-CM

## 2014-08-10 MED ORDER — ALBUTEROL SULFATE HFA 108 (90 BASE) MCG/ACT IN AERS: 2.0000 | INHALATION_SPRAY | Freq: Four times a day (QID) | RESPIRATORY_TRACT | Status: DC | PRN

## 2014-08-10 NOTE — Telephone Encounter (Signed)
Spoke with spouse. Pt needs refill on proair. i have sent this in. Nothing further needed

## 2014-08-28 ENCOUNTER — Encounter: Payer: Self-pay | Admitting: Internal Medicine

## 2014-09-08 ENCOUNTER — Other Ambulatory Visit: Payer: Self-pay | Admitting: Internal Medicine

## 2014-09-08 ENCOUNTER — Other Ambulatory Visit: Payer: Self-pay | Admitting: Cardiovascular Disease

## 2014-09-10 ENCOUNTER — Other Ambulatory Visit: Payer: Self-pay | Admitting: Internal Medicine

## 2014-09-25 ENCOUNTER — Ambulatory Visit (INDEPENDENT_AMBULATORY_CARE_PROVIDER_SITE_OTHER): Payer: Medicare Other

## 2014-09-25 DIAGNOSIS — J309 Allergic rhinitis, unspecified: Secondary | ICD-10-CM

## 2014-10-05 ENCOUNTER — Other Ambulatory Visit: Payer: Self-pay | Admitting: Internal Medicine

## 2014-10-09 ENCOUNTER — Ambulatory Visit (INDEPENDENT_AMBULATORY_CARE_PROVIDER_SITE_OTHER): Payer: Medicare Other

## 2014-10-09 DIAGNOSIS — J309 Allergic rhinitis, unspecified: Secondary | ICD-10-CM

## 2014-10-18 ENCOUNTER — Ambulatory Visit: Payer: Medicare Other

## 2014-10-25 ENCOUNTER — Ambulatory Visit (INDEPENDENT_AMBULATORY_CARE_PROVIDER_SITE_OTHER): Payer: Medicare Other

## 2014-10-25 DIAGNOSIS — J309 Allergic rhinitis, unspecified: Secondary | ICD-10-CM

## 2014-11-02 ENCOUNTER — Encounter (HOSPITAL_COMMUNITY): Payer: Self-pay | Admitting: Urology

## 2014-11-02 ENCOUNTER — Ambulatory Visit (INDEPENDENT_AMBULATORY_CARE_PROVIDER_SITE_OTHER): Payer: Medicare Other

## 2014-11-02 DIAGNOSIS — J309 Allergic rhinitis, unspecified: Secondary | ICD-10-CM

## 2014-11-09 ENCOUNTER — Ambulatory Visit: Payer: Medicare Other

## 2014-11-13 ENCOUNTER — Other Ambulatory Visit: Payer: Self-pay | Admitting: Internal Medicine

## 2014-11-15 ENCOUNTER — Ambulatory Visit (INDEPENDENT_AMBULATORY_CARE_PROVIDER_SITE_OTHER): Payer: Medicare Other

## 2014-11-15 DIAGNOSIS — J309 Allergic rhinitis, unspecified: Secondary | ICD-10-CM

## 2014-11-20 ENCOUNTER — Ambulatory Visit: Payer: Medicare Other

## 2014-12-07 ENCOUNTER — Ambulatory Visit: Payer: Medicare Other

## 2014-12-08 ENCOUNTER — Ambulatory Visit (INDEPENDENT_AMBULATORY_CARE_PROVIDER_SITE_OTHER): Payer: Medicare Other

## 2014-12-08 DIAGNOSIS — J309 Allergic rhinitis, unspecified: Secondary | ICD-10-CM

## 2014-12-12 ENCOUNTER — Ambulatory Visit (INDEPENDENT_AMBULATORY_CARE_PROVIDER_SITE_OTHER): Payer: Medicare Other | Admitting: Pulmonary Disease

## 2014-12-12 ENCOUNTER — Encounter: Payer: Self-pay | Admitting: Pulmonary Disease

## 2014-12-12 ENCOUNTER — Telehealth: Payer: Self-pay | Admitting: Cardiovascular Disease

## 2014-12-12 VITALS — BP 148/88 | HR 70 | Temp 97.0°F | Ht 70.0 in | Wt 286.8 lb

## 2014-12-12 DIAGNOSIS — J45901 Unspecified asthma with (acute) exacerbation: Secondary | ICD-10-CM

## 2014-12-12 MED ORDER — METHYLPREDNISOLONE ACETATE 80 MG/ML IJ SUSP
120.0000 mg | Freq: Once | INTRAMUSCULAR | Status: AC
  Administered 2014-12-12: 120 mg via INTRAMUSCULAR

## 2014-12-12 NOTE — Patient Instructions (Signed)
Finish up your zpak Will give you a steroid shot to help with your symptoms followup with Dr. Annamaria Boots

## 2014-12-12 NOTE — Telephone Encounter (Signed)
Have him seen by flex Pa and check ecg

## 2014-12-12 NOTE — Progress Notes (Signed)
   Subjective:    Patient ID: Grant [name], male    DOB: [birth year], 64 y.o.   MRN: [id number]  HPI Patient comes in today for an acute sick visit. Is normally followed by Dr. Annamaria Boots for allergies and intermittent episodes of acute asthmatic bronchitis. Approximately 2-3 weeks ago he began to develop cough and congestion and was treated with a course of a Z-Pak. He had some improvement, but continued to have persistent symptoms, and has been given another Z-Pak which he started 2 days ago. Despite this, he has noticed mild increased shortness of breath over the last day or so, but no significant wheezing or chest discomfort. He has had a cough with white mucus production as well as nasal symptoms.   Review of Systems  Constitutional: Negative for fever and unexpected weight change.  HENT: Positive for congestion and postnasal drip. Negative for dental problem, ear pain, nosebleeds, rhinorrhea, sinus pressure, sneezing, sore throat and trouble swallowing.   Eyes: Negative for redness and itching.  Respiratory: Positive for cough and shortness of breath. Negative for chest tightness and wheezing.   Cardiovascular: Positive for palpitations. Negative for leg swelling.  Gastrointestinal: Negative for nausea and vomiting.  Genitourinary: Negative for dysuria.  Musculoskeletal: Negative for joint swelling.  Skin: Negative for rash.  Neurological: Negative for headaches.  Hematological: Does not bruise/bleed easily.  Psychiatric/Behavioral: Negative for dysphoric mood. The patient is not nervous/anxious.        Objective:   Physical Exam Overweight male in no acute distress Nose without purulence or discharge noted Neck without lymphadenopathy or thyromegaly Chest totally clear to auscultation, no wheezing Cardiac exam with regular rate and rhythm, no murmur Lower extremities with minimal edema, no cyanosis Alert and oriented, moves all 4 extremities.       Assessment & Plan:

## 2014-12-12 NOTE — Telephone Encounter (Signed)
Pt c/o Shortness Of Breath: STAT if SOB developed within the last 24 hours or pt is noticeably SOB on the phone  1. Are you currently SOB (can you hear that pt is SOB on the phone)? Yes  2. How long have you been experiencing SOB? 3days  3. Are you SOB when sitting or when up moving around? Both  4. Are you currently experiencing any other symptoms? Heart flutter//sweating off and on   [name] is calling and stated pt went to see his Pulmonary Dr Keturah Barre today and had a prednisone shot.

## 2014-12-12 NOTE — Addendum Note (Signed)
Addended by: Mathis Bud on: 12/12/2014 12:43 PM   Modules accepted: Orders

## 2014-12-12 NOTE — Assessment & Plan Note (Signed)
The patient comes in with symptoms that are most consistent with acute asthmatic bronchitis. Is already been treated with antibiotics, but has not had a short course of steroids. I would like to treat him with a course of prednisone, but the patient and his family member states that it "makes him mean". They would prefer a steroid shot, and we'll therefore give him a dose of Depo-Medrol in the office. He knows to call Dr. Annamaria Boots if he does not have significant improvement.

## 2014-12-12 NOTE — Telephone Encounter (Signed)
SPOKE WITH [name]    PT  COMPLAINING  OF  SOB   OVER  LAST  3  DAYS AS  WELL  AS  HEART  FLUTTERING AND   PERSPIRING  OFF AND O N     WAS SEEN THIS  AM  BY DR  CLANCE   HAS  BRONCHITIS  ANTIBIOTIC  STARTED   AS  WELL AS   STEROID   INJECTION GIVEN   NOT  SURE  IF  OUT  OF  RYTHYM   HEART  RATE  THIS AM WAS  IN THE  70'S WILL FORWARD TO DR  [name]  FOR  REVIEW   HAS  APPT   IN MARCH   FOR  YEARLY WILL  FORWARD TO DR [name]    FOR  RECOMMENDATIONS .Adonis Housekeeper

## 2014-12-13 NOTE — Telephone Encounter (Signed)
PT'S  WIFE  CALLED  BACK  UNABLE  TO  USE  APPT SLOT  FOR  TODAY    REVIEWED  DR  NISHAN'S  SCHEDULE  FOR  TOMORROW   APPT SLOT  AVAILABLE  AT  9:0 AM  [name]  AWARE ./CY APPT MADE

## 2014-12-13 NOTE — Telephone Encounter (Signed)
LM TO  CALL BACK   FLEX  PA  CAN SEE  PT  TODAY  AT   2:30 PM .Adonis Housekeeper

## 2014-12-13 NOTE — Progress Notes (Signed)
Patient ID: Grant [name], male   DOB: Jan 22, 1951, 64 y.o.   MRN: [id number] 64 y.o.  [place] Reviewed records from [facility]. Afib converted fairly quickly and echo with nomral EF. Has been on xarelto since. No history of CAD, chest pain or previous anesthetic issues. 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 He and his wife do not seem to think this is a problem Can walk 2 miles without issue. Seems to have a slow affect. 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 Back in afib  He has chronic dyspnea from obesity.  He was in NSR today    ROS: Denies fever, malais, weight loss, blurry vision, decreased visual acuity, cough, sputum, SOB, hemoptysis, pleuritic pain, palpitaitons, heartburn, abdominal pain, melena, lower extremity edema, claudication, or rash.  All other systems reviewed and negative  General: Affect slow  Obese white male  HEENT: normal Neck supple with no adenopathy JVP normal no bruits no thyromegaly Lungs clear with no wheezing and good diaphragmatic motion Heart:  S1/S2 no murmur, no rub, gallop or click PMI normal Abdomen: benighn, BS positve, no tenderness, no AAA no bruit.  No HSM or HJR Distal pulses intact with no bruits No edema Neuro non-focal Skin warm and dry No muscular weakness   Current Outpatient Prescriptions  Medication Sig Dispense Refill  . albuterol (PROAIR HFA) 108 (90 BASE) MCG/ACT inhaler Inhale 2 puffs into the lungs every 6 (six) hours as needed for wheezing or shortness of breath. 1 Inhaler 3  . ALPRAZolam (XANAX) 0.5 MG tablet Take 0.5 mg by mouth 2 (two) times daily as needed for anxiety.     Marland Kitchen ascorbic acid  (C-1000/ROSE HIPS) 1000 MG tablet Take 1,000 mg by mouth daily.    Marland Kitchen azelastine (ASTELIN) 137 MCG/SPRAY nasal spray Place 1-2 sprays into the nose 2 (two) times daily. Use in each nostril as directed    . azithromycin (ZITHROMAX) 500 MG tablet as needed.     . benzonatate (TESSALON) 100 MG capsule Take 100-200 mg by mouth as directed. Take 1 to 2 capsules every 3 to 4 hours as needed    . Cholecalciferol (VITAMIN D-3) 5000 UNITS TABS Take 1 tablet by mouth 2 (two) times daily.    Marland Kitchen COLCRYS 0.6 MG tablet Take 0.6 mg by mouth every morning. Once a day    . dextromethorphan (DELSYM) 30 MG/5ML liquid Take 30 mg by mouth at bedtime. As needed for coughing    . fexofenadine (ALLEGRA) 180 MG tablet Take 180 mg by mouth daily.    . finasteride (PROSCAR) 5 MG tablet Take 5 mg by mouth daily.     Marland Kitchen levothyroxine (SYNTHROID, LEVOTHROID) 112 MCG tablet Take 112 mcg by mouth daily before breakfast.     . liothyronine (CYTOMEL) 25 MCG tablet Take 25 mcg by mouth every morning.     Marland Kitchen losartan (COZAAR) 50 MG tablet TAKE 2 TABLETS BY MOUTH ONCE DAILY. 60 tablet 3  . metoprolol succinate (TOPROL-XL) 100 MG 24 hr tablet Take 1 tablet (100 mg total) by mouth every evening. Take with or immediately following a meal. 30 tablet 11  . neomycin-polymyxin-hydrocortisone (CORTISPORIN) 3.5-10000-1 otic suspension Place  4 drops in ear(s) 3 (three) times daily. When ear is infected    . omeprazole (PRILOSEC) 40 MG capsule Take 40 mg by mouth 2 (two) times daily.      Marland Kitchen perphenazine (TRILAFON) 2 MG tablet Take 2 mg by mouth 2 (two) times daily as needed. As needed    . Probiotic Product (ALIGN) 4 MG CAPS Take 1 capsule by mouth daily.      . Rivaroxaban (XARELTO) 20 MG TABS tablet Take 1 tablet (20 mg total) by mouth daily. Patient takes in evening 30 tablet 11  . SYMBICORT 160-4.5 MCG/ACT inhaler INHALE 2 PUFFS INTO THE LUNGS TWICE DAILY 10.2 g 0  . tamsulosin (FLOMAX) 0.4 MG CAPS capsule Take 0.4 mg by mouth daily after  supper.    . testosterone cypionate (DEPOTESTOTERONE CYPIONATE) 100 MG/ML injection Inject 200 mg into the muscle every 7 (seven) days. For IM use only    . venlafaxine (EFFEXOR-XR) 150 MG 24 hr capsule Take 150 mg by mouth every morning.     . vitamin B-12 (CYANOCOBALAMIN) 1000 MCG tablet Take 1,000 mcg by mouth daily.       No current facility-administered medications for this visit.    Allergies  Codeine  Electrocardiogram:  05/18/13  SR rate 64 normal  12/14/14  SR rate 71 poor R wave progression   Assessment and Plan

## 2014-12-14 ENCOUNTER — Ambulatory Visit (INDEPENDENT_AMBULATORY_CARE_PROVIDER_SITE_OTHER): Payer: Medicare Other | Admitting: Cardiovascular Disease

## 2014-12-14 ENCOUNTER — Encounter: Payer: Self-pay | Admitting: Cardiovascular Disease

## 2014-12-14 VITALS — BP 140/88 | HR 71 | Ht 70.0 in | Wt 288.0 lb

## 2014-12-14 DIAGNOSIS — I480 Paroxysmal atrial fibrillation: Secondary | ICD-10-CM

## 2014-12-14 DIAGNOSIS — J410 Simple chronic bronchitis: Secondary | ICD-10-CM

## 2014-12-14 DIAGNOSIS — I10 Essential (primary) hypertension: Secondary | ICD-10-CM

## 2014-12-14 DIAGNOSIS — R0600 Dyspnea, unspecified: Secondary | ICD-10-CM

## 2014-12-14 NOTE — Assessment & Plan Note (Signed)
With sleep apnea and obesity On antibiotics and had depomedrol shot yesterday f/U [name]

## 2014-12-14 NOTE — Assessment & Plan Note (Signed)
Well controlled.  Continue current medications and low sodium Dash type diet.    

## 2014-12-14 NOTE — Patient Instructions (Signed)

## 2014-12-14 NOTE — Assessment & Plan Note (Signed)
Maint NSR  Has colonoscopy scheduled in March with Dr Lyndel Pleasure to proceed so long as bronchitis resolved.  Hold xarelto 2 days before

## 2014-12-19 ENCOUNTER — Ambulatory Visit (HOSPITAL_COMMUNITY): Payer: Medicare Other | Attending: Cardiology

## 2014-12-19 DIAGNOSIS — R0600 Dyspnea, unspecified: Secondary | ICD-10-CM | POA: Diagnosis not present

## 2014-12-19 NOTE — Progress Notes (Signed)
2D Echo completed. 12/19/2014

## 2014-12-20 ENCOUNTER — Ambulatory Visit: Payer: Medicare Other

## 2015-01-04 ENCOUNTER — Other Ambulatory Visit: Payer: Self-pay | Admitting: Gastroenterology

## 2015-01-06 ENCOUNTER — Other Ambulatory Visit: Payer: Self-pay | Admitting: Internal Medicine

## 2015-01-06 ENCOUNTER — Other Ambulatory Visit: Payer: Self-pay | Admitting: Cardiovascular Disease

## 2015-01-08 ENCOUNTER — Ambulatory Visit: Payer: Medicare Other | Admitting: Cardiovascular Disease

## 2015-01-22 ENCOUNTER — Ambulatory Visit (INDEPENDENT_AMBULATORY_CARE_PROVIDER_SITE_OTHER): Payer: Medicare Other

## 2015-01-22 DIAGNOSIS — J309 Allergic rhinitis, unspecified: Secondary | ICD-10-CM | POA: Diagnosis not present

## 2015-02-07 ENCOUNTER — Ambulatory Visit (INDEPENDENT_AMBULATORY_CARE_PROVIDER_SITE_OTHER): Payer: Medicare Other

## 2015-02-07 DIAGNOSIS — J309 Allergic rhinitis, unspecified: Secondary | ICD-10-CM

## 2015-02-14 ENCOUNTER — Ambulatory Visit (INDEPENDENT_AMBULATORY_CARE_PROVIDER_SITE_OTHER): Payer: Medicare Other

## 2015-02-14 ENCOUNTER — Other Ambulatory Visit: Payer: Self-pay | Admitting: Internal Medicine

## 2015-02-14 DIAGNOSIS — J309 Allergic rhinitis, unspecified: Secondary | ICD-10-CM

## 2015-03-07 ENCOUNTER — Other Ambulatory Visit: Payer: Self-pay | Admitting: Internal Medicine

## 2015-03-12 ENCOUNTER — Other Ambulatory Visit: Payer: Self-pay

## 2015-03-12 DIAGNOSIS — I480 Paroxysmal atrial fibrillation: Secondary | ICD-10-CM

## 2015-03-12 MED ORDER — METOPROLOL SUCCINATE ER 100 MG PO TB24
100.0000 mg | ORAL_TABLET | Freq: Every evening | ORAL | Status: DC
Start: 2015-03-12 — End: 2015-10-08

## 2015-03-12 MED ORDER — XARELTO 20 MG PO TABS: 20.0000 mg | ORAL_TABLET | Freq: Every evening | ORAL | Status: DC

## 2015-03-13 ENCOUNTER — Ambulatory Visit (INDEPENDENT_AMBULATORY_CARE_PROVIDER_SITE_OTHER): Payer: Medicare Other

## 2015-03-13 DIAGNOSIS — J309 Allergic rhinitis, unspecified: Secondary | ICD-10-CM

## 2015-03-28 ENCOUNTER — Encounter: Payer: Self-pay | Admitting: Internal Medicine

## 2015-05-01 ENCOUNTER — Ambulatory Visit: Payer: Medicare Other

## 2015-05-01 ENCOUNTER — Telehealth: Payer: Self-pay | Admitting: Internal Medicine

## 2015-05-01 NOTE — Telephone Encounter (Signed)
Date Mixed: 05/01/2015 Vial: A Strength: 1:10 Here/Mail/Pick Up: Here Mixed By: Desmond Dike, CMA

## 2015-05-01 NOTE — Telephone Encounter (Signed)
ERROR. Pt is no longer on vaccine.

## 2015-05-14 ENCOUNTER — Encounter: Payer: Self-pay | Admitting: Internal Medicine

## 2015-05-17 ENCOUNTER — Telehealth: Payer: Self-pay | Admitting: Internal Medicine

## 2015-05-17 NOTE — Telephone Encounter (Signed)
This patient requesting appointment for Allergy shot. To [name] in Allergy Requesting call back around 12pm

## 2015-05-18 ENCOUNTER — Ambulatory Visit: Payer: Medicare Other

## 2015-05-18 NOTE — Telephone Encounter (Signed)
Called and spoke to pt's wife. Appt made for pt for allergy injection on 8.1.63. [name] verbalized understanding and denied any further questions or concerns at this time.

## 2015-05-21 ENCOUNTER — Ambulatory Visit: Payer: Medicare Other

## 2015-05-24 NOTE — Telephone Encounter (Signed)
Called pt.'s wife Grant [name]. Morning to let her know we did not have any vac.(it had exp. So we had to discard it.) For her husband and asked her to call me if she had any questions.(So they wouldn't waste a trip.) Grant [name] came in and said forget it,it's too much trouble. His last shot was 02/14/15 0.5 of 1:10. Please advise, where to restart.

## 2015-05-24 NOTE — Telephone Encounter (Signed)
If he is restarting, then drop back to 0.1 ml of 1:10 and rebuld as tolerated

## 2015-05-24 NOTE — Telephone Encounter (Signed)
I spoke with Grant [name] She asked him if he was interested in restarting his shots again he said it was too much of a production to come back if it's been awhile since he's been in. You had told him he could cime in whenever he needed in 2013.Grant [name] has the note and his allergy record.) They go to [employer] a lot and he doesn't need his shots there. His wife told he is taking saline and generic claritin he'll cont. That and see how he does.

## 2015-06-28 ENCOUNTER — Telehealth: Payer: Self-pay | Admitting: *Deleted

## 2015-06-28 DIAGNOSIS — I77810 Thoracic aortic ectasia: Secondary | ICD-10-CM

## 2015-06-28 NOTE — Telephone Encounter (Signed)
[name]  AWARE OF THE  NEED  FOR  CTA  AORTA   DUE I N SEPT  PER  WIFE  ARE OUT OF TOWN UNTIL  9-19 OR 20 -16 CALL AFTER  THOSE  DATES  TO SCHEDULE

## 2015-07-17 ENCOUNTER — Ambulatory Visit (INDEPENDENT_AMBULATORY_CARE_PROVIDER_SITE_OTHER)
Admission: RE | Admit: 2015-07-17 | Discharge: 2015-07-17 | Disposition: A | Discharge status: Home / Self Care | Payer: Medicare Other | Source: Ambulatory Visit | Attending: Cardiovascular Disease | Admitting: Cardiovascular Disease

## 2015-07-17 DIAGNOSIS — I712 Thoracic aortic aneurysm, without rupture: Secondary | ICD-10-CM | POA: Diagnosis not present

## 2015-07-17 DIAGNOSIS — I77810 Thoracic aortic ectasia: Secondary | ICD-10-CM

## 2015-07-17 MED ORDER — IOHEXOL 350 MG/ML SOLN
100.0000 mL | Freq: Once | INTRAVENOUS | Status: AC | PRN
  Administered 2015-07-17: 100 mL via INTRAVENOUS

## 2015-08-23 ENCOUNTER — Telehealth: Payer: Self-pay | Admitting: Cardiovascular Disease

## 2015-08-23 DIAGNOSIS — R079 Chest pain, unspecified: Secondary | ICD-10-CM

## 2015-08-23 NOTE — Telephone Encounter (Signed)
Spoke with pt wife, okay per DPR, with pt sitting near by.  Pt c/o to wife that he has been having a dull aching CP with exertion for the past few week, and he also becomes SOB.  Pt stated that he was out for a walk around the neighborhood a other day and pain started and again today while fixing some fence boards.  Pt stated pain is toward the middle/right of his sternum.  Pain stops once he stops the activity. Pt has been taking all medications as prescribed.  Pt is worried as mother had a AAA and there is a family history of heart disease Told pt that I will forward his concerns to Dr. [name] and our office will call him back. Pt and wife educated that if pain persist outside of exercise he needs to go to the emergency room. They verbalized understanding no questions at this this time.

## 2015-08-23 NOTE — Telephone Encounter (Signed)
Pt c/o of Chest Pain: STAT if CP now or developed within 24 hours  1. Are you having CP right now? No  2. Are you experiencing any other symptoms (ex. SOB, nausea, vomiting, sweating)? Sob  3. How long have you been experiencing CP? 1 Week  4. Is your CP continuous or coming and going? Coming and going  5. Have you taken Nitroglycerin? No   [name] calling stating that pt is experiencing a sharp pain in his chest upon exertion. ?

## 2015-08-24 NOTE — Telephone Encounter (Signed)
[name]  AWARE  NOT  SURE WHETHER   PT  WILL  HAVE  MYOVIEW   DONE  OR NOT   WILL  CHECK  AND   CALL BACK ON Monday   WILL PROCEED WITH CXR ON Monday ./CY

## 2015-08-28 NOTE — Telephone Encounter (Signed)
PER WIFE   WILL TRY  AND  DO LEXISCAN ORDER  ENTERED AS  WELL AS  CXR  ORDER DONE .Adonis Housekeeper

## 2015-08-29 ENCOUNTER — Telehealth (HOSPITAL_COMMUNITY): Payer: Self-pay

## 2015-08-29 NOTE — Telephone Encounter (Signed)
Encounter complete. 

## 2015-08-30 ENCOUNTER — Ambulatory Visit (HOSPITAL_COMMUNITY)
Admission: RE | Admit: 2015-08-30 | Discharge: 2015-08-30 | Disposition: A | Discharge status: Home / Self Care | Payer: Medicare Other | Source: Ambulatory Visit | Attending: Cardiology | Admitting: Cardiology

## 2015-08-30 DIAGNOSIS — Z87891 Personal history of nicotine dependence: Secondary | ICD-10-CM | POA: Insufficient documentation

## 2015-08-30 DIAGNOSIS — I10 Essential (primary) hypertension: Secondary | ICD-10-CM | POA: Insufficient documentation

## 2015-08-30 DIAGNOSIS — Z8249 Family history of ischemic heart disease and other diseases of the circulatory system: Secondary | ICD-10-CM | POA: Diagnosis not present

## 2015-08-30 DIAGNOSIS — R0609 Other forms of dyspnea: Secondary | ICD-10-CM | POA: Insufficient documentation

## 2015-08-30 DIAGNOSIS — E669 Obesity, unspecified: Secondary | ICD-10-CM | POA: Insufficient documentation

## 2015-08-30 DIAGNOSIS — R002 Palpitations: Secondary | ICD-10-CM | POA: Insufficient documentation

## 2015-08-30 DIAGNOSIS — R0602 Shortness of breath: Secondary | ICD-10-CM | POA: Insufficient documentation

## 2015-08-30 DIAGNOSIS — G4733 Obstructive sleep apnea (adult) (pediatric): Secondary | ICD-10-CM | POA: Insufficient documentation

## 2015-08-30 DIAGNOSIS — R079 Chest pain, unspecified: Secondary | ICD-10-CM | POA: Diagnosis not present

## 2015-08-30 DIAGNOSIS — R5383 Other fatigue: Secondary | ICD-10-CM | POA: Diagnosis not present

## 2015-08-30 DIAGNOSIS — Z6841 Body Mass Index (BMI) 40.0 and over, adult: Secondary | ICD-10-CM | POA: Diagnosis not present

## 2015-08-30 MED ORDER — TECHNETIUM TC 99M SESTAMIBI GENERIC - CARDIOLITE
32.1000 | Freq: Once | INTRAVENOUS | Status: AC | PRN
  Administered 2015-08-30: 32.1 via INTRAVENOUS

## 2015-08-30 MED ORDER — REGADENOSON 0.4 MG/5ML IV SOLN
0.4000 mg | Freq: Once | INTRAVENOUS | Status: AC
  Administered 2015-08-30: 0.4 mg via INTRAVENOUS

## 2015-08-31 ENCOUNTER — Ambulatory Visit (HOSPITAL_COMMUNITY)
Admission: RE | Admit: 2015-08-31 | Discharge: 2015-08-31 | Disposition: A | Discharge status: Home / Self Care | Payer: Medicare Other | Source: Ambulatory Visit | Attending: Cardiology | Admitting: Cardiology

## 2015-08-31 LAB — MYOCARDIAL PERFUSION IMAGING
LV dias vol: 157 mL
LV sys vol: 77 mL
Peak HR: 117 {beats}/min
Rest HR: 86 {beats}/min
SDS: 1
SRS: 1
SSS: 2

## 2015-08-31 MED ORDER — TECHNETIUM TC 99M SESTAMIBI GENERIC - CARDIOLITE
31.6000 | Freq: Once | INTRAVENOUS | Status: AC | PRN
  Administered 2015-08-31: 31.6 via INTRAVENOUS

## 2015-09-03 ENCOUNTER — Ambulatory Visit (INDEPENDENT_AMBULATORY_CARE_PROVIDER_SITE_OTHER)
Admission: RE | Admit: 2015-09-03 | Discharge: 2015-09-03 | Disposition: A | Discharge status: Home / Self Care | Payer: Medicare Other | Source: Ambulatory Visit | Attending: Cardiovascular Disease | Admitting: Cardiovascular Disease

## 2015-09-03 ENCOUNTER — Telehealth: Payer: Self-pay | Admitting: Cardiovascular Disease

## 2015-09-03 DIAGNOSIS — R079 Chest pain, unspecified: Secondary | ICD-10-CM | POA: Diagnosis not present

## 2015-09-03 NOTE — Telephone Encounter (Signed)
[name] AWARE OF  MYOVIEW  RESULTS  XRAY   STILL PENDING AT THIS TIME .Grant [name]

## 2015-09-03 NOTE — Telephone Encounter (Signed)
Follow Up  Pt wife returned the call.. Would also like to notate that the pt had an Xray completed this afternoon. Please return call

## 2015-09-08 ENCOUNTER — Other Ambulatory Visit: Payer: Self-pay | Admitting: Internal Medicine

## 2015-09-26 NOTE — Progress Notes (Signed)
Patient ID: Grant [name], male   DOB: 04/01/1951, 64 y.o.   MRN: [id number]   63 y.o.  [place] Reviewed records from [facility]. Afib converted fairly quickly and echo with nomral EF. Has been on xarelto since. No history of CAD, chest pain or previous anesthetic issues. 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 He and his wife do not seem to think this is a problem Can walk 2 miles without issue. Seems to have a slow affect. 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    Echo 12/19/14  Reviewed  Study Conclusions  - Left ventricle: The cavity size was normal. There was mild concentric hypertrophy. Systolic function was normal. The estimated ejection fraction was in the range of 50% to 55%. Wall motion was normal; there were no regional wall motion abnormalities. Doppler parameters are consistent with abnormal left ventricular relaxation (grade 1 diastolic dysfunction). Doppler parameters are consistent with high ventricular filling pressure. - Aortic valve: Bicuspid; normal thickness, mildly calcified leaflets. There was moderate regurgitation. - Aorta: Ascending aortic diameter: 49 mm (S). - Ascending aorta: The ascending aorta was moderately dilated. - Left atrium: The atrium was mildly dilated. - Right ventricle: The cavity size was moderately dilated. Wall thickness was normal. - Right atrium: The atrium was moderately dilated.  08/31/15 Normal myovue  Reviewed:   Nuclear stress EF: 51%.  The left ventricular ejection fraction is mildly decreased (45-54%).  There was no ST segment deviation noted during stress.  No T wave inversion was noted during stress.  The study is normal.  This is a low risk study.  ROS: Denies  fever, malais, weight loss, blurry vision, decreased visual acuity, cough, sputum, SOB, hemoptysis, pleuritic pain, palpitaitons, heartburn, abdominal pain, melena, lower extremity edema, claudication, or rash.  All other systems reviewed and negative  General: Affect slow  Obese white male  HEENT: normal Neck supple with no adenopathy JVP normal no bruits no thyromegaly Lungs clear with no wheezing and good diaphragmatic motion Heart:  S1/S2 AR  murmur, no rub, gallop or click PMI normal Abdomen: benighn, BS positve, no tenderness, no AAA no bruit.  No HSM or HJR Distal pulses intact with no bruits No edema Neuro non-focal Skin warm and dry No muscular weakness   Current Outpatient Prescriptions  Medication Sig Dispense Refill  . ALPRAZolam (XANAX) 0.5 MG tablet Take 0.5 mg by mouth 2 (two) times daily as needed for anxiety.     Marland Kitchen ascorbic acid (C-1000/ROSE HIPS) 1000 MG tablet Take 1,000 mg by mouth daily.    Marland Kitchen azelastine (ASTELIN) 137 MCG/SPRAY nasal spray Place 1-2 sprays into the nose 2 (two) times daily. Use in each nostril as directed    . azithromycin (ZITHROMAX) 500 MG tablet as needed.     . benzonatate (TESSALON) 100 MG capsule Take 100-200 mg by mouth as directed. Take 1 to 2 capsules every 3 to 4 hours as needed    . Cholecalciferol (VITAMIN D-3) 5000 UNITS TABS Take 1 tablet by mouth 2 (two) times daily.    Marland Kitchen COLCRYS 0.6 MG tablet Take 0.6 mg by mouth every morning. Once a day    . dextromethorphan (DELSYM) 30 MG/5ML liquid Take 30 mg by mouth at bedtime. As needed for coughing    .  fexofenadine (ALLEGRA) 180 MG tablet Take 180 mg by mouth daily.    . finasteride (PROSCAR) 5 MG tablet Take 5 mg by mouth daily.     Marland Kitchen levothyroxine (SYNTHROID, LEVOTHROID) 112 MCG tablet Take 112 mcg by mouth daily before breakfast.     . liothyronine (CYTOMEL) 25 MCG tablet Take 25 mcg by mouth every morning.     Marland Kitchen losartan (COZAAR) 100 MG tablet Take 1 tablet (100 mg total) by mouth  daily. 90 tablet 3  . metoprolol succinate (TOPROL-XL) 100 MG 24 hr tablet Take 1 tablet (100 mg total) by mouth every evening. Take with or immediately following a meal. 90 tablet 3  . neomycin-polymyxin-hydrocortisone (CORTISPORIN) 3.5-10000-1 otic suspension Place 4 drops in ear(s) 3 (three) times daily. When ear is infected    . omeprazole (PRILOSEC) 40 MG capsule Take 40 mg by mouth 2 (two) times daily.      Marland Kitchen perphenazine (TRILAFON) 2 MG tablet Take 2 mg by mouth 2 (two) times daily as needed. As needed    . PROAIR HFA 108 (90 BASE) MCG/ACT inhaler INHALE 2 PUFFS INTO THE LUNGS EVERY 6 HOURS AS NEEDED FOR WHEEZING OR SHORTNESS OF BREATH. 8.5 g 1  . Probiotic Product (ALIGN) 4 MG CAPS Take 1 capsule by mouth daily.      . SYMBICORT 160-4.5 MCG/ACT inhaler INHALE 2 PUFF INTO THE LUNGS 2 TIMES A DAY. 10.2 g 1  . tamsulosin (FLOMAX) 0.4 MG CAPS capsule Take 0.4 mg by mouth daily after supper.    . testosterone cypionate (DEPOTESTOTERONE CYPIONATE) 100 MG/ML injection Inject 200 mg into the muscle every 7 (seven) days. For IM use only    . venlafaxine (EFFEXOR-XR) 150 MG 24 hr capsule Take 150 mg by mouth every morning.     . vitamin B-12 (CYANOCOBALAMIN) 1000 MCG tablet Take 1,000 mcg by mouth daily.      Alveda Reasons 20 MG TABS tablet Take 1 tablet (20 mg total) by mouth every evening. 30 tablet 11   No current facility-administered medications for this visit.    Allergies  Codeine  Electrocardiogram:  05/18/13  SR rate 64 normal  12/14/14  SR rate 71 poor R wave progression   Assessment and Plan Bicuspid AV:  Predominant lesion is moderate AR  LV compensated f/u echo 12/19/15  Aortic Root:  CT 06/2015 degraded but measures 4.7 cm  F/u  MRI/MRA in March given bicuspid valve And current size would likely need intervention at 5.0 cm PAF:  maint NSR on NOAC no bleeding issues Prostate: post cystoscopy on flomax f/u urology  No results found for: PSA   [name]

## 2015-10-03 ENCOUNTER — Other Ambulatory Visit: Payer: Self-pay | Admitting: Internal Medicine

## 2015-10-08 ENCOUNTER — Encounter: Payer: Self-pay | Admitting: Cardiovascular Disease

## 2015-10-08 ENCOUNTER — Ambulatory Visit (INDEPENDENT_AMBULATORY_CARE_PROVIDER_SITE_OTHER): Payer: Medicare Other | Admitting: Cardiovascular Disease

## 2015-10-08 VITALS — BP 118/76 | HR 69 | Ht 70.0 in | Wt 279.4 lb

## 2015-10-08 DIAGNOSIS — I480 Paroxysmal atrial fibrillation: Secondary | ICD-10-CM | POA: Diagnosis not present

## 2015-10-08 DIAGNOSIS — I10 Essential (primary) hypertension: Secondary | ICD-10-CM | POA: Diagnosis not present

## 2015-10-08 DIAGNOSIS — I351 Nonrheumatic aortic (valve) insufficiency: Secondary | ICD-10-CM

## 2015-10-08 MED ORDER — METOPROLOL SUCCINATE ER 100 MG PO TB24: 100.0000 mg | ORAL_TABLET | Freq: Every evening | ORAL | Status: DC

## 2015-10-08 MED ORDER — XARELTO 20 MG PO TABS: 20.0000 mg | ORAL_TABLET | Freq: Every evening | ORAL | Status: DC

## 2015-10-08 MED ORDER — LOSARTAN POTASSIUM 100 MG PO TABS: 100.0000 mg | ORAL_TABLET | Freq: Every day | ORAL | Status: DC

## 2015-10-08 NOTE — Patient Instructions (Addendum)
Medication Instructions:  Your physician recommends that you continue on your current medications as directed. Please refer to the Current Medication list given to you today.   Labwork: NONE  Testing/Procedures: Your physician has requested that you have an echocardiogram. Echocardiography is a painless test that uses sound waves to create images of your heart. It provides your doctor with information about the size and shape of your heart and how well your heart's chambers and valves are working. This procedure takes approximately one hour. There are no restrictions for this procedure. MARCH  SEE DR [name] SAME DAY  Follow-Up: Your physician recommends that you schedule a follow-up appointment in: 3 MONTHS  WITH  DR [name]   ECHO SAME DAY  Any Other Special Instructions Will Be Listed Below (If Applicable).     If you need a refill on your cardiac medications before your next appointment, please call your pharmacy.

## 2015-11-26 ENCOUNTER — Encounter: Payer: Self-pay | Admitting: Cardiovascular Disease

## 2015-12-07 DIAGNOSIS — G4733 Obstructive sleep apnea (adult) (pediatric): Secondary | ICD-10-CM | POA: Diagnosis not present

## 2016-01-10 DIAGNOSIS — L723 Sebaceous cyst: Secondary | ICD-10-CM | POA: Diagnosis not present

## 2016-01-10 DIAGNOSIS — L821 Other seborrheic keratosis: Secondary | ICD-10-CM | POA: Diagnosis not present

## 2016-01-10 DIAGNOSIS — D239 Other benign neoplasm of skin, unspecified: Secondary | ICD-10-CM | POA: Diagnosis not present

## 2016-01-15 NOTE — Progress Notes (Signed)
Patient ID: Grant [name], male   DOB: 21-Jun-1951, 65 y.o.   MRN: [id number]   65 y.o.  [place] Reviewed records from [facility]. Afib converted fairly quickly and echo with nomral EF. Has been on xarelto since. No history of CAD, chest pain or previous anesthetic issues. 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 He and his wife do not seem to think this is a problem Can walk 2 miles without issue. Seems to have a slow affect. 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    Echo 01/18/16 reviewed EF normal moderate AR bicuspid valve Ao root 5.1 cm   08/31/15 Normal myovue  Reviewed:   Nuclear stress EF: 51%.  The left ventricular ejection fraction is mildly decreased (45-54%).  There was no ST segment deviation noted during stress.  No T wave inversion was noted during stress.  The study is normal.  This is a low risk study.   No dyspnea palpitations or syncope   ROS: Denies fever, malais, weight loss, blurry vision, decreased visual acuity, cough, sputum, SOB, hemoptysis, pleuritic pain, palpitaitons, heartburn, abdominal pain, melena, lower extremity edema, claudication, or rash.  All other systems reviewed and negative  General: Affect slow  Obese white male  HEENT: normal Neck supple with no adenopathy JVP normal no bruits no thyromegaly Lungs clear with no wheezing and good diaphragmatic motion Heart:  S1/S2 AR  murmur, no rub, gallop or click PMI normal Abdomen: benighn, BS positve, no tenderness, no AAA no bruit.  No HSM or HJR Distal pulses intact with no bruits No edema Neuro non-focal Skin warm and dry No muscular weakness   Current Outpatient Prescriptions  Medication Sig Dispense Refill  . ALPRAZolam (XANAX) 0.5 MG tablet  Take 0.5 mg by mouth 2 (two) times daily as needed for anxiety.     Marland Kitchen ascorbic acid (C-1000/ROSE HIPS) 1000 MG tablet Take 1,000 mg by mouth daily.    Marland Kitchen azelastine (ASTELIN) 137 MCG/SPRAY nasal spray Place 1-2 sprays into the nose 2 (two) times daily. Use in each nostril as directed    . azithromycin (ZITHROMAX) 500 MG tablet as needed.     . benzonatate (TESSALON) 100 MG capsule Take 100-200 mg by mouth as directed. Take 1 to 2 capsules every 3 to 4 hours as needed    . Cholecalciferol (VITAMIN D-3) 5000 UNITS TABS Take 1 tablet by mouth 2 (two) times daily.    Marland Kitchen COLCRYS 0.6 MG tablet Take 0.6 mg by mouth every morning. Once a day    . dextromethorphan (DELSYM) 30 MG/5ML liquid Take 30 mg by mouth at bedtime. As needed for coughing    . fexofenadine (ALLEGRA) 180 MG tablet Take 180 mg by mouth daily.    . finasteride (PROSCAR) 5 MG tablet Take 5 mg by mouth daily.     Marland Kitchen levothyroxine (SYNTHROID, LEVOTHROID) 112 MCG tablet Take 112 mcg by mouth daily before breakfast.     . liothyronine (CYTOMEL) 25 MCG tablet Take 25 mcg by mouth every morning.     Marland Kitchen losartan (COZAAR) 100 MG tablet Take 1 tablet (100 mg total) by mouth daily. 90 tablet 3  . metoprolol succinate (TOPROL-XL) 100 MG 24 hr tablet Take 1 tablet (100 mg total) by mouth every  evening. Take with or immediately following a meal. 90 tablet 3  . neomycin-polymyxin-hydrocortisone (CORTISPORIN) 3.5-10000-1 otic suspension Place 4 drops in ear(s) 3 (three) times daily. When ear is infected    . omeprazole (PRILOSEC) 40 MG capsule Take 40 mg by mouth 2 (two) times daily.      Marland Kitchen perphenazine (TRILAFON) 2 MG tablet Take 2 mg by mouth 2 (two) times daily as needed. As needed    . PROAIR HFA 108 (90 BASE) MCG/ACT inhaler INHALE 2 PUFFS INTO THE LUNGS EVERY 6 HOURS AS NEEDED FOR WHEEZING OR SHORTNESS OF BREATH. 8.5 g 1  . Probiotic Product (ALIGN) 4 MG CAPS Take 1 capsule by mouth daily.      . SYMBICORT 160-4.5 MCG/ACT inhaler INHALE 2 PUFF INTO  THE LUNGS 2 TIMES A DAY. 10.2 g 1  . tamsulosin (FLOMAX) 0.4 MG CAPS capsule Take 0.4 mg by mouth daily after supper.    . testosterone cypionate (DEPOTESTOTERONE CYPIONATE) 100 MG/ML injection Inject 200 mg into the muscle every 7 (seven) days. For IM use only    . venlafaxine (EFFEXOR-XR) 150 MG 24 hr capsule Take 150 mg by mouth every morning.     . vitamin B-12 (CYANOCOBALAMIN) 1000 MCG tablet Take 1,000 mcg by mouth daily.      Alveda Reasons 20 MG TABS tablet Take 1 tablet (20 mg total) by mouth every evening. 30 tablet 11   No current facility-administered medications for this visit.    Allergies  Codeine  Electrocardiogram:  05/18/13  SR rate 64 normal  12/14/14  SR rate 71 poor R wave progression  01/18/16  SR rate 58 normal   Assessment and Plan Bicuspid AV:  Predominant lesion is moderate AR  LV compensated Echo today stable moderate AR  Aortic Root:  CT 06/2015 degraded but measures 4.7 cm  F/u  MRI/MRA given TTE measurement. He is large But has had MRI before with no issues and thinks he can do it. Can measure using [employer], IIR and MRA  PAF:  maint NSR on NOAC no bleeding issues  Prostate: post cystoscopy on flomax f/u urology  No results found for: PSA  Sees [place]    [name]

## 2016-01-18 ENCOUNTER — Ambulatory Visit (HOSPITAL_COMMUNITY): Payer: Medicare Other | Attending: Cardiology

## 2016-01-18 ENCOUNTER — Other Ambulatory Visit: Payer: Self-pay

## 2016-01-18 ENCOUNTER — Ambulatory Visit (INDEPENDENT_AMBULATORY_CARE_PROVIDER_SITE_OTHER): Payer: Medicare Other | Admitting: Cardiovascular Disease

## 2016-01-18 ENCOUNTER — Encounter: Payer: Self-pay | Admitting: Cardiovascular Disease

## 2016-01-18 VITALS — BP 140/60 | HR 62 | Ht 70.0 in | Wt 282.0 lb

## 2016-01-18 DIAGNOSIS — I119 Hypertensive heart disease without heart failure: Secondary | ICD-10-CM | POA: Insufficient documentation

## 2016-01-18 DIAGNOSIS — G4733 Obstructive sleep apnea (adult) (pediatric): Secondary | ICD-10-CM | POA: Diagnosis not present

## 2016-01-18 DIAGNOSIS — I77810 Thoracic aortic ectasia: Secondary | ICD-10-CM | POA: Diagnosis not present

## 2016-01-18 DIAGNOSIS — Z87891 Personal history of nicotine dependence: Secondary | ICD-10-CM | POA: Insufficient documentation

## 2016-01-18 DIAGNOSIS — I719 Aortic aneurysm of unspecified site, without rupture: Secondary | ICD-10-CM

## 2016-01-18 DIAGNOSIS — I351 Nonrheumatic aortic (valve) insufficiency: Secondary | ICD-10-CM

## 2016-01-18 DIAGNOSIS — I059 Rheumatic mitral valve disease, unspecified: Secondary | ICD-10-CM | POA: Diagnosis not present

## 2016-01-18 DIAGNOSIS — Q231 Congenital insufficiency of aortic valve: Secondary | ICD-10-CM | POA: Diagnosis not present

## 2016-01-18 DIAGNOSIS — I10 Essential (primary) hypertension: Secondary | ICD-10-CM

## 2016-01-18 DIAGNOSIS — I359 Nonrheumatic aortic valve disorder, unspecified: Secondary | ICD-10-CM | POA: Diagnosis present

## 2016-01-18 LAB — BASIC METABOLIC PANEL
BUN: 14 mg/dL (ref 7–25)
CO2: 31 mmol/L (ref 20–31)
Calcium: 9 mg/dL (ref 8.6–10.3)
Chloride: 104 mmol/L (ref 98–110)
Creat: 1.03 mg/dL (ref 0.70–1.25)
Glucose, Bld: 79 mg/dL (ref 65–99)
Potassium: 4.2 mmol/L (ref 3.5–5.3)
Sodium: 141 mmol/L (ref 135–146)

## 2016-01-18 NOTE — Patient Instructions (Addendum)
Medication Instructions:  Your physician recommends that you continue on your current medications as directed. Please refer to the Current Medication list given to you today.  Labwork: Your physician recommends that you have lab work today- BMET  Testing/Procedures: Your physician has requested that you have a cardiac MRI. Cardiac MRI uses a computer to create images of your heart as its beating, producing both still and moving pictures of your heart and major blood vessels. For further information please visit [email or web address] Please follow the instruction sheet given to you today for more information.  Follow-Up: Your physician wants you to follow-up in: 6 months with Dr. [name]. You will receive a reminder letter in the mail two months in advance. If you don't receive a letter, please call our office to schedule the follow-up appointment.  If you need a refill on your cardiac medications before your next appointment, please call your pharmacy.

## 2016-01-29 ENCOUNTER — Encounter: Payer: Self-pay | Admitting: Cardiovascular Disease

## 2016-02-04 ENCOUNTER — Other Ambulatory Visit: Payer: Self-pay | Admitting: Cardiovascular Disease

## 2016-02-04 ENCOUNTER — Ambulatory Visit (HOSPITAL_COMMUNITY)
Admission: RE | Admit: 2016-02-04 | Discharge: 2016-02-04 | Disposition: A | Discharge status: Home / Self Care | Payer: Medicare Other | Source: Ambulatory Visit | Attending: Cardiovascular Disease | Admitting: Cardiovascular Disease

## 2016-02-04 DIAGNOSIS — I719 Aortic aneurysm of unspecified site, without rupture: Secondary | ICD-10-CM

## 2016-02-05 ENCOUNTER — Telehealth: Payer: Self-pay | Admitting: Cardiovascular Disease

## 2016-02-05 DIAGNOSIS — Z01812 Encounter for preprocedural laboratory examination: Secondary | ICD-10-CM

## 2016-02-05 DIAGNOSIS — I719 Aortic aneurysm of unspecified site, without rupture: Secondary | ICD-10-CM

## 2016-02-05 DIAGNOSIS — Q231 Congenital insufficiency of aortic valve: Secondary | ICD-10-CM

## 2016-02-05 NOTE — Telephone Encounter (Signed)
Left detailed message on wife's personal voicemail informing her that Dr. [name] and his nurse are out of the office today. Informed that they would address this when they return and she would get a return phone call about their concern/question.

## 2016-02-05 NOTE — Telephone Encounter (Signed)
Discussed with MRI techs they were trying to see if he could be done on 3T magnet Due to his size not ideal.   Better to just get BMET and do cardiac CTA for bicuspid aortic valve and aortic aneurysm

## 2016-02-05 NOTE — Telephone Encounter (Signed)
New Message  Pt called states that she received a letter indicating the MRI was at 3:30. Was told at the MRI department that the appt was at 4p. They took him back and he sat for 4:45p. Pt wife states that they were treated poorly. She states that they will resch but they will not play the waiting game again. Please call back to discuss if the appt for MRI is even still needed. Please call

## 2016-02-06 NOTE — Telephone Encounter (Signed)
Called patient, who gave verbal permission to speak with his spouse, and spoke with his spouse. Patient's spouse given recommendation per Dr. [name] to have cardiac CTA and a BMET. Paient needs test done before May. Will send message to pre auth and [facility] for scheduling. Patient's spouse agreed with plan and will call her back with time to come in for lab work.

## 2016-02-08 ENCOUNTER — Encounter: Payer: Self-pay | Admitting: Cardiovascular Disease

## 2016-02-11 ENCOUNTER — Encounter (HOSPITAL_COMMUNITY): Payer: Self-pay

## 2016-02-11 ENCOUNTER — Ambulatory Visit (HOSPITAL_COMMUNITY)
Admission: RE | Admit: 2016-02-11 | Discharge: 2016-02-11 | Disposition: A | Discharge status: Home / Self Care | Payer: Medicare Other | Source: Ambulatory Visit | Attending: Cardiovascular Disease | Admitting: Cardiovascular Disease

## 2016-02-11 DIAGNOSIS — I77810 Thoracic aortic ectasia: Secondary | ICD-10-CM | POA: Insufficient documentation

## 2016-02-11 DIAGNOSIS — Q231 Congenital insufficiency of aortic valve: Secondary | ICD-10-CM

## 2016-02-11 DIAGNOSIS — I719 Aortic aneurysm of unspecified site, without rupture: Secondary | ICD-10-CM | POA: Diagnosis not present

## 2016-02-11 DIAGNOSIS — I712 Thoracic aortic aneurysm, without rupture: Secondary | ICD-10-CM | POA: Diagnosis not present

## 2016-02-11 DIAGNOSIS — Q2381 Bicuspid aortic valve: Secondary | ICD-10-CM

## 2016-02-11 MED ORDER — IOPAMIDOL (ISOVUE-370) INJECTION 76%
INTRAVENOUS | Status: AC
  Filled 2016-02-11: qty 100

## 2016-02-11 MED ORDER — IOPAMIDOL (ISOVUE-370) INJECTION 76%
80.0000 mL | Freq: Once | INTRAVENOUS | Status: AC | PRN
  Administered 2016-02-11: 80 mL via INTRAVENOUS

## 2016-02-11 NOTE — Telephone Encounter (Signed)
Patient has CTA today and had recent lab work.

## 2016-03-01 ENCOUNTER — Other Ambulatory Visit: Payer: Self-pay | Admitting: Internal Medicine

## 2016-05-21 DIAGNOSIS — G4733 Obstructive sleep apnea (adult) (pediatric): Secondary | ICD-10-CM | POA: Diagnosis not present

## 2016-06-04 ENCOUNTER — Encounter: Payer: Self-pay | Admitting: Cardiovascular Disease

## 2016-06-04 DIAGNOSIS — M109 Gout, unspecified: Secondary | ICD-10-CM | POA: Diagnosis not present

## 2016-06-04 DIAGNOSIS — E291 Testicular hypofunction: Secondary | ICD-10-CM | POA: Diagnosis not present

## 2016-06-04 DIAGNOSIS — N400 Enlarged prostate without lower urinary tract symptoms: Secondary | ICD-10-CM | POA: Diagnosis not present

## 2016-06-04 DIAGNOSIS — E559 Vitamin D deficiency, unspecified: Secondary | ICD-10-CM | POA: Diagnosis not present

## 2016-06-04 DIAGNOSIS — E7800 Pure hypercholesterolemia, unspecified: Secondary | ICD-10-CM | POA: Diagnosis not present

## 2016-06-04 DIAGNOSIS — R5383 Other fatigue: Secondary | ICD-10-CM | POA: Diagnosis not present

## 2016-06-04 DIAGNOSIS — E279 Disorder of adrenal gland, unspecified: Secondary | ICD-10-CM | POA: Diagnosis not present

## 2016-06-04 DIAGNOSIS — I351 Nonrheumatic aortic (valve) insufficiency: Secondary | ICD-10-CM | POA: Diagnosis not present

## 2016-06-04 DIAGNOSIS — E039 Hypothyroidism, unspecified: Secondary | ICD-10-CM | POA: Diagnosis not present

## 2016-06-04 DIAGNOSIS — J45998 Other asthma: Secondary | ICD-10-CM | POA: Diagnosis not present

## 2016-06-17 DIAGNOSIS — H5212 Myopia, left eye: Secondary | ICD-10-CM | POA: Diagnosis not present

## 2016-07-15 DIAGNOSIS — E7800 Pure hypercholesterolemia, unspecified: Secondary | ICD-10-CM | POA: Diagnosis not present

## 2016-07-17 ENCOUNTER — Other Ambulatory Visit: Payer: Self-pay | Admitting: Internal Medicine

## 2016-07-17 DIAGNOSIS — R351 Nocturia: Secondary | ICD-10-CM | POA: Diagnosis not present

## 2016-07-17 DIAGNOSIS — R350 Frequency of micturition: Secondary | ICD-10-CM | POA: Diagnosis not present

## 2016-07-17 DIAGNOSIS — N401 Enlarged prostate with lower urinary tract symptoms: Secondary | ICD-10-CM | POA: Diagnosis not present

## 2016-08-14 ENCOUNTER — Telehealth: Payer: Self-pay | Admitting: Cardiovascular Disease

## 2016-08-14 DIAGNOSIS — I7121 Aneurysm of the ascending aorta, without rupture: Secondary | ICD-10-CM

## 2016-08-14 DIAGNOSIS — I712 Thoracic aortic aneurysm, without rupture: Secondary | ICD-10-CM

## 2016-08-14 NOTE — Telephone Encounter (Signed)
Left message to call back  

## 2016-08-14 NOTE — Telephone Encounter (Signed)
New Message  Pt wife call requesting to speak with RN about getting pt see sooner by Dr. [name] next week. Pt wife states he is having some pressure on right side, but is currently out of town. Please call back to discuss

## 2016-08-14 NOTE — Telephone Encounter (Signed)
Follow Up:; ° ° °Returning your call. °

## 2016-08-14 NOTE — Telephone Encounter (Signed)
Patient is due for an office visit and a repeat CT for his ascending aortic aneurysm. Patient's wife (DPR) states patient has been having some chest tightness and they are on vacation at this time. Patient's wife wanted to see Dr. [name] as soon as they get back to town. Informed patient's wife that the first available with Dr. [name] is in January. Encouraged patient's wife to see Cecilie Kicks NP when they get back into town next week, and made patient a future appointment with Dr. [name] in January. Encouraged patient's wife that if patient is really having chest tightness, he should go to ED. Patient's wife stated that she thinks her husband is fine for now. Ordered repeat CT per Dr. Kyla Balzarine result note from last CT in April 2018. Will send message to [facility] for scheduling. Patient's wife agreed to plan.

## 2016-08-20 NOTE — Progress Notes (Signed)
Cardiology Office Note   Date:  08/21/2016   ID:  Grant [name], DOB [birth year], MRN [id number]  PCP:  Shirline Frees, MD  Cardiologist:  Dr. [name]    Chief Complaint  Patient presents with  . Chest Pain    pt states sometimes he feel some pressure that awaken him from sleep. states the pain is on the right side of his chest   . Shortness of Breath    no SOB       History of Present Illness: Grant [name] is a 65 y.o. male who presents for chest tightness.  Hx of a fib in 2014 resolved quickly and Echo with normal EF has been on Xarelto since, hx of toxic encephalopathy.   Echo 2017 EF moderate AR bicuspid valve Ao root 5.1 cm,  08/2015 normal myoview EF 51%, EF 45-54%, no st segment deviation, not T wave inversion, study is low risk and normal.  Aortic Root:  CT 06/2015 degraded but measures 4.7 cm  F/u  MRI/MRA given TTE measurement. He is large but has had MRI before with no issues and thinks he can do it. Can measure using Fiesta, IIR and MRA.  Coronary CT with morphology in April 2017 1) Calcium Score 0 2) Sub-optimal scan but normal appearing right dominant coronary arteries distal circumflex and RCA not well seen 3) Moderate to severe aortic root dilatation fusiform 4.9 cm in double oblique orthogonal planes 4) No AV stenosis Area 3.7 cm2 Functionally bicuspid valve   Today pt here with sunglasses and hat in place.  He has been having chest pressure that comes and goes for about a month.  Comes with exertion or not and exertion does not necessarily bring it on.  No associated symptoms.  His wife answers most of questions.  He has long hx of reflux but he states this is different.  His BP is elevated but his wife states it is up and down.  Is hard to control.   He had labs through Dr. Sharol Roussel TSH is 1.93,  Na 138, K+ 4.3, CL 102, BUN 13, Cr. 1.05  LFTs wnl. lipomed panel with LDL 128, and particles are mostly small or mod in size.  These labs done when he was on Atkins  diet.     Past Medical History:  Diagnosis Date  . Allergic rhinitis   . Anxiety   . Bladder tumor   . Chronic fatigue   . Chronic fatigue   . Depression   . Fibromyalgia   . GERD (gastroesophageal reflux disease)   . History of chronic bronchitis   . History of migraine   . History of toxic encephalopathy   . Hypothyroidism   . OSA on CPAP    CPAP 14  . PAF (paroxysmal atrial fibrillation) ([place]) CARDIOLOGIST -- DR [name]   DX OCT 2013  . 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 Unspecified essential hypertension   . Urethral tumor    PROSTATIC    Past Surgical History:  Procedure Laterality Date  . CYSTOSCOPY W/ RETROGRADES Bilateral 06/15/2013   Procedure: CYSTOSCOPY WITH BILATERAL RETROGRADE PYELOGRAM  BLADDER BIOPSY, PROSTATIC URETHRAL BIOPSY, ;  Surgeon: Molli Hazard, MD;  Location: [facility];  Service: Urology;  Laterality: Bilateral;  . LAPAROSCOPIC CHOLECYSTECTOMY  01-14-2001  . UMBILICAL HERNIA REPAIR  01-14-2008     Current Outpatient Prescriptions  Medication Sig Dispense Refill  . ascorbic acid (C-1000/ROSE HIPS) 1000 MG tablet Take 1,000 mg by mouth daily.    Marland Kitchen  azelastine (ASTELIN) 137 MCG/SPRAY nasal spray Place 1-2 sprays into the nose 2 (two) times daily. Use in each nostril as directed    . azithromycin (ZITHROMAX) 500 MG tablet as needed.     . benzonatate (TESSALON) 100 MG capsule Take 100-200 mg by mouth as directed. Take 1 to 2 capsules every 3 to 4 hours as needed    . Cholecalciferol (VITAMIN D-3) 5000 UNITS TABS Take 1 tablet by mouth 2 (two) times daily.    Marland Kitchen COLCRYS 0.6 MG tablet Take 0.6 mg by mouth every morning. Once a day    . dextromethorphan (DELSYM) 30 MG/5ML liquid Take 30 mg by mouth at bedtime. As needed for coughing    . DHEA 50 MG TABS Take 1 tablet by mouth daily.    . fexofenadine (ALLEGRA) 180 MG tablet Take 180 mg by mouth daily.    . finasteride (PROSCAR) 5 MG tablet Take 5 mg by mouth daily.     Marland Kitchen levothyroxine  (SYNTHROID, LEVOTHROID) 112 MCG tablet Take 112 mcg by mouth daily before breakfast.     . liothyronine (CYTOMEL) 25 MCG tablet Take 25 mcg by mouth every morning.     Marland Kitchen losartan (COZAAR) 100 MG tablet Take 1 tablet (100 mg total) by mouth daily. 90 tablet 3  . metoprolol succinate (TOPROL-XL) 100 MG 24 hr tablet Take 1 tablet (100 mg total) by mouth every evening. Take with or immediately following a meal. 90 tablet 3  . Multiple Vitamins-Minerals (ONE-A-DAY MENS HEALTH FORMULA PO) Take 1 tablet by mouth daily.    Marland Kitchen neomycin-polymyxin-hydrocortisone (CORTISPORIN) 3.5-10000-1 otic suspension Place 4 drops in ear(s) 3 (three) times daily. When ear is infected    . omeprazole (PRILOSEC) 40 MG capsule Take 40 mg by mouth 2 (two) times daily.      Marland Kitchen perphenazine (TRILAFON) 2 MG tablet Take 2 mg by mouth 2 (two) times daily as needed. As needed    . Probiotic Product (ALIGN) 4 MG CAPS Take 1 capsule by mouth daily.      . SYMBICORT 160-4.5 MCG/ACT inhaler INHALE 2 PUFF INTO THE LUNGS 2 TIMES A DAY. 10.2 g 1  . tamsulosin (FLOMAX) 0.4 MG CAPS capsule Take 0.4 mg by mouth daily after supper.    . testosterone cypionate (DEPOTESTOTERONE CYPIONATE) 100 MG/ML injection Inject 200 mg into the muscle every 7 (seven) days. For IM use only    . venlafaxine (EFFEXOR-XR) 150 MG 24 hr capsule Take 150 mg by mouth every morning.     . VENTOLIN HFA 108 (90 Base) MCG/ACT inhaler INHALE 2 PUFFS INTO LUNGS EVERY 6 HOURS AS NEEDED FOR WHEEZING OR SHORTNESS OF BREATH 18 g 0  . vitamin B-12 (CYANOCOBALAMIN) 1000 MCG tablet Take 1,000 mcg by mouth daily.      Alveda Reasons 20 MG TABS tablet Take 1 tablet (20 mg total) by mouth every evening. 30 tablet 11   No current facility-administered medications for this visit.     Allergies:   Codeine    Social History:  The patient  reports that he quit smoking about 36 years ago. His smoking use included Cigarettes. He has a 13.50 pack-year smoking history. He has never used  smokeless tobacco. He reports that he drinks about 12.6 oz of alcohol per week . He reports that he does not use drugs.   Family History:  The patient's family history includes Aortic aneurysm (age of onset: 15) in his mother; Other in his father.    ROS:  General:no colds or fevers, + weight loss with Atkins Skin:no rashes or ulcers HEENT:no blurred vision, no congestion CV:see HPI PUL:see HPI GI:no diarrhea constipation or melena, no indigestion GU:no hematuria, no dysuria MS:no joint pain, no claudication Neuro:no syncope, no lightheadedness Endo:no diabetes, + thyroid disease  Wt Readings from Last 3 Encounters:  08/21/16 269 lb (122 kg)  01/18/16 282 lb (127.9 kg)  10/08/15 279 lb 6.4 oz (126.7 kg)     PHYSICAL EXAM: VS:  BP (!) 150/86   Pulse (!) 57   Ht 5\' 11"  (1.803 m)   Wt 269 lb (122 kg)   BMI 37.52 kg/m  , BMI Body mass index is 37.52 kg/m. General:Pleasant affect, NAD Skin:Warm and dry, brisk capillary refill HEENT:normocephalic, sclera clear, mucus membranes moist Neck:supple, no JVD, no bruits  Heart:S1S2 RRR with soft murmur, no gallup, rub or click Lungs:clear without rales, rhonchi, or wheezes [id number], non tender, + BS, do not palpate liver spleen or masses Ext:no lower ext edema, 2+ pedal pulses, 2+ radial pulses Neuro:alert and oriented X 3, MAE, follows commands, + facial symmetry    EKG:  EKG is ordered today. The ekg ordered today demonstrates sinus brady at 57 normal EKG   Recent Labs: 01/18/2016: BUN 14; Creat 1.03; Potassium 4.2; Sodium 141    Lipid Panel No results found for: CHOL, TRIG, HDL, CHOLHDL, VLDL, LDLCALC, LDLDIRECT     Other studies Reviewed: Additional studies/ records that were reviewed today include: . Cardiac CTA and morphology 01/2016 IMPRESSION: 1) Calcium Score 0 2) Sub-optimal scan but normal appearing right dominant coronary arteries distal circumflex and RCA not well seen 3) Moderate to severe aortic  root dilatation fusiform 4.9 cm in double oblique orthogonal planes 4) No AV stenosis Area 3.7 cm2 Functionally bicuspid valve  Nuc study Study Highlights    Nuclear stress EF: 51%.  The left ventricular ejection fraction is mildly decreased (45-54%).  There was no ST segment deviation noted during stress.  No T wave inversion was noted during stress.  The study is normal.  This is a low risk study.   Low risk stress nuclear study with normal perfusion and mildly reduced left ventricular global systolic function. Suspect nonischemic cardiomyopathy.   CTA of chest 06/2015 IMPRESSION: 1. Study is degraded by significant cardiac motion artifact. Ascending aortic aneurysm measuring 4.7 cm by best estimate. No acute aortic syndrome. Recommend semi-annual imaging followup by CTA or MRA and referral to cardiothoracic surgery if not already obtained. This recommendation follows 2010 ACCF/AHA/AATS/ACR/ASA/SCA/SCAI/SIR/STS/SVM Guidelines for the Diagnosis and Management of Patients With Thoracic Aortic Disease. Circulation. 2010; 121: [id number] 2. No active pulmonary disease.   ASSESSMENT AND PLAN:  1.  Chest pressure episodically EKG without changes, with previous ca+ score of 0 and normal Cors on Cardiac CTA I worry that this is due to aortic aneurysm.  Will proceed with CT of his chest that Dr. [name] wanted done before Jan visit.  If increase of aorta will refer to Dr. [name].  Pt does not believe this is GERD.  2.  dilated aortic root. 4.9 on cardiac CTA with morphology.  will repeat.  3. HTN needs improved control, wife stated that it is up and down.  4. Bicuspid AV- last echo with stable moderate AR  5. PAF on xarelto maintaining SR.  6. Has small particle lipids, LDL of 128 we discussed statin but they wish to wait until after Dr. Sharol Roussel repeats labs in near future.    Current medicines are reviewed  with the patient today.  The patient Has no concerns regarding  medicines.  The following changes have been made:  See above Labs/ tests ordered today include:see above  Disposition:   FU:  see above  Signed, Cecilie Kicks, NP  08/21/2016 12:16 PM    [facility], [place], [address] [place], [place] Phone: [phone]; Fax: [phone]

## 2016-08-21 ENCOUNTER — Ambulatory Visit (INDEPENDENT_AMBULATORY_CARE_PROVIDER_SITE_OTHER): Payer: Medicare Other | Admitting: Cardiology

## 2016-08-21 ENCOUNTER — Encounter: Payer: Self-pay | Admitting: Cardiology

## 2016-08-21 VITALS — BP 150/86 | HR 57 | Ht 71.0 in | Wt 269.0 lb

## 2016-08-21 DIAGNOSIS — I7121 Aneurysm of the ascending aorta, without rupture: Secondary | ICD-10-CM

## 2016-08-21 DIAGNOSIS — I719 Aortic aneurysm of unspecified site, without rupture: Secondary | ICD-10-CM | POA: Diagnosis not present

## 2016-08-21 DIAGNOSIS — I480 Paroxysmal atrial fibrillation: Secondary | ICD-10-CM

## 2016-08-21 DIAGNOSIS — R0789 Other chest pain: Secondary | ICD-10-CM | POA: Diagnosis not present

## 2016-08-21 DIAGNOSIS — Q231 Congenital insufficiency of aortic valve: Secondary | ICD-10-CM

## 2016-08-21 DIAGNOSIS — I10 Essential (primary) hypertension: Secondary | ICD-10-CM

## 2016-08-21 DIAGNOSIS — I712 Thoracic aortic aneurysm, without rupture: Secondary | ICD-10-CM

## 2016-08-21 MED ORDER — XARELTO 20 MG PO TABS: 20.0000 mg | ORAL_TABLET | Freq: Every evening | ORAL | 11 refills | Status: DC

## 2016-08-21 MED ORDER — METOPROLOL SUCCINATE ER 100 MG PO TB24: 100.0000 mg | ORAL_TABLET | Freq: Every evening | ORAL | 3 refills | Status: DC

## 2016-08-21 NOTE — Patient Instructions (Addendum)
Medication Instructions:  Your physician recommends that you continue on your current medications as directed. Please refer to the Current Medication list given to you today.  Labwork: None ordered  Testing/Procedures: Non-Cardiac CT Angiography (CTA), is a special type of CT scan that uses a computer to produce multi-dimensional views of major blood vessels throughout the body. In CT angiography, a contrast material is injected through an IV to help visualize the blood vessels   Follow-Up: Your physician recommends that you schedule a follow-up appointment in: [place] Grant [name] Cancel 10/31/16 ARRIVING AT 8:30   Any Other Special Instructions Will Be Listed Below (If Applicable).  Cardiac CT Angiogram A cardiac CT angiogram is a test to help your health care provider find out why you are having chest pains or other symptoms of heart disease. The test uses an advanced type of X-ray machine that scans your heart and the area around the heart and creates multiple pictures of it. Other names for the test are coronary CT angiography, coronary artery scanning, and CTA.  The test is painless and fairly quick. It is noninvasive. That means it does not involve any type of surgery or cuts (incisions). Instead, a fluid called contrast dye is injected into an IV tube in your arm. The contrast dye acts as a highlighter as it flows through the veins. With the CT scan, it lets your health care provider see:   If the coronary arteries in your heart are more narrow than they should be, or if they are blocked.  If there is fluid around the heart.  If the muscles and tissues of the heart look weak or show signs of disease.  If the lungs contain any blood clots. LET [facility] CARE PROVIDER KNOW ABOUT:  Any allergies you have.   All medicines you are taking, including vitamins, herbs, eye drops, creams, and over-the-counter medicines.  Previous problems you or members of your family have had  with the use of anesthetics.  Any blood disorders you have.  Previous surgeries you have had.  Medical conditions you have. RISKS AND COMPLICATIONS Generally, this is a safe procedure. However, as with any procedure, problems can occur. Possible problems include:   Allergic reaction to the contrast dye. This can range from mild to severe and may include:   Itching at the IV tube insertion site.   Redness at the IV tube insertion site.   Hives.   Nausea.   Difficulty breathing.   Kidney failure.   Problems from radiation exposure. This test involves the use of radiation. Radiation exposure can be dangerous to a pregnant patient and fetus. If you are pregnant, shields are used to protect your belly and pelvic area. More details are available from your health care provider. BEFORE THE PROCEDURE  The day before the test:    Stop drinking caffeinated beverages. These include energy drinks, tea, soda, coffee, and hot chocolate.  Stop taking medicines to treat erectile dysfunction. They can interfere with medicines you may be given during the procedure. Check with your health care provider if you should stop taking any other medicines. On the day of the test:   About 4 hours before the test, stop eating and drinking anything but water as advised by your health care provider.  Avoid wearing jewelry. You will have to undress from the waist up and wear a hospital gown. PROCEDURE  The hair on your chest may need to be shaved. This is done because small sticky patches  called electrodes are put on your chest. These transmit information that helps monitor your heart during the test.  You might be given heart medicine during the test. This is done to control your heart rate during the test so a good image is obtained.  An IV tube will be inserted in your arm.  You will be asked to lie on a table with your arms above your head.  The contrast dye will be injected into the IV  tube. You might feel warm or you may get a metallic taste in your mouth.  The table you are lying on will move into a large machine that will do the scanning.  You will be able to see, hear, and talk to the person running the machine while you are in it. Follow that person's directions. You may be asked to hold your breath for 2-3 seconds as pictures are taken.  The CT machine will move around you to take pictures. Do not move while it is scanning. This helps to get a good image of your heart.  When the best possible pictures have been taken, the machine will be turned off. The table will move out of the machine. The IV tube will then be removed. AFTER THE PROCEDURE  You will be allowed to get dressed and return to your normal activities.  Results will be interpreted by the health care provider and the results will be discussed with you.   This information is not intended to replace advice given to you by your health care provider. Make sure you discuss any questions you have with your health care provider.   Document Released: 09/18/2008 Document Revised: 10/27/2014 Document Reviewed: 06/22/2013 Elsevier Interactive Patient Education [employer].     If you need a refill on your cardiac medications before your next appointment, please call your pharmacy.

## 2016-08-22 ENCOUNTER — Encounter: Payer: Self-pay | Admitting: Cardiovascular Disease

## 2016-08-22 ENCOUNTER — Ambulatory Visit: Payer: Medicare Other | Admitting: Cardiology

## 2016-09-05 ENCOUNTER — Ambulatory Visit (HOSPITAL_COMMUNITY)
Admission: RE | Admit: 2016-09-05 | Discharge: 2016-09-05 | Disposition: A | Discharge status: Home / Self Care | Payer: Medicare Other | Source: Ambulatory Visit | Attending: Cardiovascular Disease | Admitting: Cardiovascular Disease

## 2016-09-05 DIAGNOSIS — Q231 Congenital insufficiency of aortic valve: Secondary | ICD-10-CM | POA: Diagnosis not present

## 2016-09-05 DIAGNOSIS — I712 Thoracic aortic aneurysm, without rupture: Secondary | ICD-10-CM | POA: Diagnosis not present

## 2016-09-05 DIAGNOSIS — I7121 Aneurysm of the ascending aorta, without rupture: Secondary | ICD-10-CM

## 2016-09-05 LAB — POCT I-STAT CREATININE: Creatinine, Ser: 1.1 mg/dL (ref 0.61–1.24)

## 2016-09-05 MED ORDER — METOPROLOL TARTRATE 5 MG/5ML IV SOLN
INTRAVENOUS | Status: AC
  Administered 2016-09-05: 5 mg via INTRAVENOUS
  Filled 2016-09-05: qty 5

## 2016-09-05 MED ORDER — NITROGLYCERIN 0.4 MG SL SUBL
0.4000 mg | SUBLINGUAL_TABLET | Freq: Once | SUBLINGUAL | Status: AC
  Administered 2016-09-05: 0.4 mg via SUBLINGUAL

## 2016-09-05 MED ORDER — NITROGLYCERIN 0.4 MG SL SUBL
SUBLINGUAL_TABLET | SUBLINGUAL | Status: AC
  Administered 2016-09-05: 0.4 mg via SUBLINGUAL
  Filled 2016-09-05: qty 1

## 2016-09-05 MED ORDER — METOPROLOL TARTRATE 5 MG/5ML IV SOLN
5.0000 mg | Freq: Once | INTRAVENOUS | Status: AC
  Administered 2016-09-05: 5 mg via INTRAVENOUS

## 2016-09-05 MED ORDER — IOPAMIDOL (ISOVUE-370) INJECTION 76%
INTRAVENOUS | Status: AC
  Administered 2016-09-05: 80 mL
  Filled 2016-09-05: qty 100

## 2016-09-15 DIAGNOSIS — G4733 Obstructive sleep apnea (adult) (pediatric): Secondary | ICD-10-CM | POA: Diagnosis not present

## 2016-10-14 ENCOUNTER — Encounter: Payer: Self-pay | Admitting: *Deleted

## 2016-10-14 ENCOUNTER — Other Ambulatory Visit: Payer: Self-pay | Admitting: Internal Medicine

## 2016-10-22 ENCOUNTER — Other Ambulatory Visit: Payer: Self-pay | Admitting: *Deleted

## 2016-10-22 MED ORDER — LOSARTAN POTASSIUM 100 MG PO TABS: 100.0000 mg | ORAL_TABLET | Freq: Every day | ORAL | 3 refills | Status: DC

## 2016-10-27 ENCOUNTER — Telehealth: Payer: Self-pay | Admitting: Internal Medicine

## 2016-10-27 NOTE — Telephone Encounter (Signed)
His medication list shows azithromycin (ZPAK) "as needed", which I presume means refillable. If he can start that, it would be fine. Otherwise, offer doxycycline 100mg , # 8, 2 today then one daily

## 2016-10-27 NOTE — Telephone Encounter (Signed)
Spoke with pt's wife (pt gave verbal to talk to wife) Pt non prod cough, wheezing, sneezing, increased sob & bilateral lung pain X2wk worsen over the past 4 days. Pt denies any fever, chills or sweats. Pt taking mucinex & tessalon with slight improvement.  CY please advise. Thanks.   Current Outpatient Prescriptions on File Prior to Visit  Medication Sig Dispense Refill  . ascorbic acid (C-1000/ROSE HIPS) 1000 MG tablet Take 1,000 mg by mouth daily.    Marland Kitchen azelastine (ASTELIN) 137 MCG/SPRAY nasal spray Place 1-2 sprays into the nose 2 (two) times daily. Use in each nostril as directed    . azithromycin (ZITHROMAX) 500 MG tablet as needed.     . benzonatate (TESSALON) 100 MG capsule Take 100-200 mg by mouth as directed. Take 1 to 2 capsules every 3 to 4 hours as needed    . Cholecalciferol (VITAMIN D-3) 5000 UNITS TABS Take 1 tablet by mouth 2 (two) times daily.    Marland Kitchen COLCRYS 0.6 MG tablet Take 0.6 mg by mouth every morning. Once a day    . dextromethorphan (DELSYM) 30 MG/5ML liquid Take 30 mg by mouth at bedtime. As needed for coughing    . DHEA 50 MG TABS Take 1 tablet by mouth daily.    . fexofenadine (ALLEGRA) 180 MG tablet Take 180 mg by mouth daily.    . finasteride (PROSCAR) 5 MG tablet Take 5 mg by mouth daily.     Marland Kitchen levothyroxine (SYNTHROID, LEVOTHROID) 112 MCG tablet Take 112 mcg by mouth daily before breakfast.     . liothyronine (CYTOMEL) 25 MCG tablet Take 25 mcg by mouth every morning.     Marland Kitchen losartan (COZAAR) 100 MG tablet Take 1 tablet (100 mg total) by mouth daily. 90 tablet 3  . metoprolol succinate (TOPROL-XL) 100 MG 24 hr tablet Take 1 tablet (100 mg total) by mouth every evening. Take with or immediately following a meal. 90 tablet 3  . Multiple Vitamins-Minerals (ONE-A-DAY MENS HEALTH FORMULA PO) Take 1 tablet by mouth daily.    Marland Kitchen neomycin-polymyxin-hydrocortisone (CORTISPORIN) 3.5-10000-1 otic suspension Place 4 drops in ear(s) 3 (three) times daily. When ear is infected     . omeprazole (PRILOSEC) 40 MG capsule Take 40 mg by mouth 2 (two) times daily.      Marland Kitchen perphenazine (TRILAFON) 2 MG tablet Take 2 mg by mouth 2 (two) times daily as needed. As needed    . Probiotic Product (ALIGN) 4 MG CAPS Take 1 capsule by mouth daily.      . SYMBICORT 160-4.5 MCG/ACT inhaler INHALE 2 PUFF INTO THE LUNGS 2 TIMES A DAY. 10.2 g 1  . tamsulosin (FLOMAX) 0.4 MG CAPS capsule Take 0.4 mg by mouth daily after supper.    . testosterone cypionate (DEPOTESTOTERONE CYPIONATE) 100 MG/ML injection Inject 200 mg into the muscle every 7 (seven) days. For IM use only    . venlafaxine (EFFEXOR-XR) 150 MG 24 hr capsule Take 150 mg by mouth every morning.     . VENTOLIN HFA 108 (90 Base) MCG/ACT inhaler INHALE 2 PUFFS INTO LUNGS EVERY 6 HOURS AS NEEDED FOR WHEEZING OR SHORTNESS OF BREATH 18 g 0  . vitamin B-12 (CYANOCOBALAMIN) 1000 MCG tablet Take 1,000 mcg by mouth daily.      Alveda Reasons 20 MG TABS tablet Take 1 tablet (20 mg total) by mouth every evening. 30 tablet 11   No current facility-administered medications on file prior to visit.     Allergies  Allergen  Reactions  . Codeine Itching

## 2016-10-27 NOTE — Telephone Encounter (Signed)
Spoke with pt's wife and made her aware of CY recommendations. Grant [name] states, pt has a rx for azithromycin and will start that.  [name] states she will contact if pt does not improve on azithromycin. Nothing further needed.

## 2016-10-30 NOTE — Progress Notes (Signed)
Cardiology Office Note   Date:  10/31/2016   ID:  Grant [name], DOB May 07, 1951, MRN [id number]  PCP:  Shirline Frees, MD  Cardiologist:  Dr. [name]    Chief Complaint  Patient presents with  . Atrial Fibrillation      History of Present Illness: Grant [name] is a 67 y.o. male who presents for chest tightness.  Hx of a fib in 2014 resolved quickly and Echo with normal EF has been on Xarelto since, hx of toxic encephalopathy.   Echo 2017 EF moderate AR bicuspid valve Ao root 5.1 cm,  08/2015 normal myoview EF 51%, EF 45-54%, no st segment deviation, not T wave inversion, study is low risk and normal.  Aortic Root:  CT 06/2015 degraded but measures 4.7 cm  F/u  MRI/MRA given TTE measurement. He is large but has had MRI before with no issues and thinks he can do it. Can measure using Fiesta, IIR and MRA.  Coronary CT with morphology in April 2017 1) Calcium Score 0 2) Sub-optimal scan but normal appearing right dominant coronary arteries distal circumflex and RCA not well seen 3) Moderate to severe aortic root dilatation fusiform 4.9 cm in double oblique orthogonal planes 4) No AV stenosis Area 3.7 cm2 Functionally bicuspid valve   Today pt here with sunglasses and hat in place.  Saw PA in November and complained of chest pain Cardiac CT with stable 4.9 aneurysm and no CAD   He had labs through Dr. Sharol Roussel TSH is 1.93,  Na 138, K+ 4.3, CL 102, BUN 13, Cr. 1.05  LFTs wnl. lipomed panel with LDL 128, and particles are mostly small or mod in size.  These labs done when he was on Atkins diet.    Has had bronchitis and cough with pleuritic pain since November Sees [name] has been on 2 Trials of antibiotics no fever    Past Medical History:  Diagnosis Date  . Allergic rhinitis   . Anxiety   . Bladder tumor   . Chronic fatigue   . Chronic fatigue   . Depression   . Fibromyalgia   . GERD (gastroesophageal reflux disease)   . History of chronic bronchitis   . History of  migraine   . History of toxic encephalopathy   . Hypothyroidism   . OSA on CPAP    CPAP 14  . PAF (paroxysmal atrial fibrillation) ([place]) CARDIOLOGIST -- DR [name]   DX OCT 2013  . 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 Unspecified essential hypertension   . Urethral tumor    PROSTATIC    Past Surgical History:  Procedure Laterality Date  . CYSTOSCOPY W/ RETROGRADES Bilateral 06/15/2013   Procedure: CYSTOSCOPY WITH BILATERAL RETROGRADE PYELOGRAM  BLADDER BIOPSY, PROSTATIC URETHRAL BIOPSY, ;  Surgeon: Molli Hazard, MD;  Location: [facility];  Service: Urology;  Laterality: Bilateral;  . LAPAROSCOPIC CHOLECYSTECTOMY  01-14-2001  . UMBILICAL HERNIA REPAIR  01-14-2008     Current Outpatient Prescriptions  Medication Sig Dispense Refill  . ascorbic acid (C-1000/ROSE HIPS) 1000 MG tablet Take 1,000 mg by mouth daily.    Marland Kitchen azelastine (ASTELIN) 137 MCG/SPRAY nasal spray Place 1-2 sprays into the nose 2 (two) times daily. Use in each nostril as directed    . azithromycin (ZITHROMAX) 500 MG tablet as needed.     . benzonatate (TESSALON) 100 MG capsule Take 100-200 mg by mouth as directed. Take 1 to 2 capsules every 3 to 4 hours as needed    .  Cholecalciferol (VITAMIN D-3) 5000 UNITS TABS Take 1 tablet by mouth 2 (two) times daily.    Marland Kitchen COLCRYS 0.6 MG tablet Take 0.6 mg by mouth every morning. Once a day    . dextromethorphan (DELSYM) 30 MG/5ML liquid Take 30 mg by mouth at bedtime. As needed for coughing    . DHEA 50 MG TABS Take 1 tablet by mouth daily.    . fexofenadine (ALLEGRA) 180 MG tablet Take 180 mg by mouth daily.    . finasteride (PROSCAR) 5 MG tablet Take 5 mg by mouth daily.     Marland Kitchen levothyroxine (SYNTHROID, LEVOTHROID) 112 MCG tablet Take 112 mcg by mouth daily before breakfast.     . liothyronine (CYTOMEL) 25 MCG tablet Take 25 mcg by mouth every morning.     Marland Kitchen losartan (COZAAR) 100 MG tablet Take 1 tablet (100 mg total) by mouth daily. 90 tablet 3  . metoprolol  succinate (TOPROL-XL) 100 MG 24 hr tablet Take 1 tablet (100 mg total) by mouth every evening. Take with or immediately following a meal. 90 tablet 3  . Multiple Vitamins-Minerals (ONE-A-DAY MENS HEALTH FORMULA PO) Take 1 tablet by mouth daily.    Marland Kitchen neomycin-polymyxin-hydrocortisone (CORTISPORIN) 3.5-10000-1 otic suspension Place 4 drops in ear(s) 3 (three) times daily. When ear is infected    . omeprazole (PRILOSEC) 40 MG capsule Take 40 mg by mouth 2 (two) times daily.      Marland Kitchen perphenazine (TRILAFON) 2 MG tablet Take 2 mg by mouth 2 (two) times daily as needed. As needed    . Probiotic Product (ALIGN) 4 MG CAPS Take 1 capsule by mouth daily.      . SYMBICORT 160-4.5 MCG/ACT inhaler INHALE 2 PUFF INTO THE LUNGS 2 TIMES A DAY. 10.2 g 1  . tamsulosin (FLOMAX) 0.4 MG CAPS capsule Take 0.4 mg by mouth daily after supper.    . testosterone cypionate (DEPOTESTOTERONE CYPIONATE) 100 MG/ML injection Inject 200 mg into the muscle every 7 (seven) days. For IM use only    . venlafaxine (EFFEXOR-XR) 150 MG 24 hr capsule Take 150 mg by mouth every morning.     . VENTOLIN HFA 108 (90 Base) MCG/ACT inhaler INHALE 2 PUFFS INTO LUNGS EVERY 6 HOURS AS NEEDED FOR WHEEZING OR SHORTNESS OF BREATH 18 g 0  . vitamin B-12 (CYANOCOBALAMIN) 1000 MCG tablet Take 1,000 mcg by mouth daily.      Alveda Reasons 20 MG TABS tablet Take 1 tablet (20 mg total) by mouth every evening. 30 tablet 11   No current facility-administered medications for this visit.     Allergies:   Codeine    Social History:  The patient  reports that he quit smoking about 37 years ago. His smoking use included Cigarettes. He has a 13.50 pack-year smoking history. He has never used smokeless tobacco. He reports that he drinks about 12.6 oz of alcohol per week . He reports that he does not use drugs.   Family History:  The patient's family history includes Aortic aneurysm (age of onset: 48) in his mother; Other in his father.    ROS:  General:no colds or  fevers, + weight loss with Atkins Skin:no rashes or ulcers HEENT:no blurred vision, no congestion CV:see HPI PUL:see HPI GI:no diarrhea constipation or melena, no indigestion GU:no hematuria, no dysuria MS:no joint pain, no claudication Neuro:no syncope, no lightheadedness Endo:no diabetes, + thyroid disease  Wt Readings from Last 3 Encounters:  10/31/16 271 lb 12.8 oz (123.3 kg)  09/05/16 265  lb (120.2 kg)  08/21/16 269 lb (122 kg)     PHYSICAL EXAM: VS:  BP 130/80   Pulse 62   Ht 5\' 11"  (1.803 m)   Wt 271 lb 12.8 oz (123.3 kg)   SpO2 96%   BMI 37.91 kg/m  , BMI Body mass index is 37.91 kg/m. General:Pleasant affect, NAD Skin:Warm and dry, brisk capillary refill HEENT:normocephalic, sclera clear, mucus membranes moist Neck:supple, no JVD, no bruits  Heart:S1S2 RRR with soft murmur, no gallup, rub or click Lungs:clear without rales, rhonchi, or wheezes [id number], non tender, + BS, do not palpate liver spleen or masses Ext:no lower ext edema, 2+ pedal pulses, 2+ radial pulses Neuro:alert and oriented X 3, MAE, follows commands, + facial symmetry    EKG:  EKG is ordered today. The ekg ordered today demonstrates sinus brady at 57 normal EKG   Recent Labs: 01/18/2016: BUN 14; Potassium 4.2; Sodium 141 09/05/2016: Creatinine, Ser 1.10    Lipid Panel No results found for: CHOL, TRIG, HDL, CHOLHDL, VLDL, LDLCALC, LDLDIRECT     Other studies Reviewed: Additional studies/ records that were reviewed today include: . Cardiac CTA and morphology 01/2016 IMPRESSION: 1) Calcium Score 0 2) Sub-optimal scan but normal appearing right dominant coronary arteries distal circumflex and RCA not well seen 3) Moderate to severe aortic root dilatation fusiform 4.9 cm in double oblique orthogonal planes 4) No AV stenosis Area 3.7 cm2 Functionally bicuspid valve  Nuc study Study Highlights    Nuclear stress EF: 51%.  The left ventricular ejection fraction is mildly  decreased (45-54%).  There was no ST segment deviation noted during stress.  No T wave inversion was noted during stress.  The study is normal.  This is a low risk study.   Low risk stress nuclear study with normal perfusion and mildly reduced left ventricular global systolic function. Suspect nonischemic cardiomyopathy.   CTA of chest 06/2015 IMPRESSION: 1. Study is degraded by significant cardiac motion artifact. Ascending aortic aneurysm measuring 4.7 cm by best estimate. No acute aortic syndrome. Recommend semi-annual imaging followup by CTA or MRA and referral to cardiothoracic surgery if not already obtained. This recommendation follows 2010 ACCF/AHA/AATS/ACR/ASA/SCA/SCAI/SIR/STS/SVM Guidelines for the Diagnosis and Management of Patients With Thoracic Aortic Disease. Circulation. 2010; 121: [id number] 2. No active pulmonary disease.   ASSESSMENT AND PLAN:  1.  Chest pressure episodically EKG without changes, with previous ca+ score of 0 and normal Cors on Cardiac CTA aorta stable by CT observe   2.  Aortic Aneurysm . Stable by CT 09/05/16 4.9 cm   3. HTN needs improved control, wife stated that it is up and down.  4. Bicuspid AV- last echo with stable moderate AR  5. PAF on xarelto maintaining SR.  6. Has small particle lipids, LDL of 128 we discussed statin but they wish to wait until after Dr. Sharol Roussel repeats labs in near future.   7. Called [name] Parret at pulmonary office She was gracious to arrange a depomedrol shot for Grant [name] Today after his visit with [place].   Current medicines are reviewed with the patient today.  The patient Has no concerns regarding medicines.  The following changes have been made:  See above Labs/ tests ordered today include:see above   F/U pulmonary today  F/U with me in 6 months   [name]

## 2016-10-31 ENCOUNTER — Encounter: Payer: Self-pay | Admitting: Internal Medicine

## 2016-10-31 ENCOUNTER — Ambulatory Visit (INDEPENDENT_AMBULATORY_CARE_PROVIDER_SITE_OTHER)
Admission: RE | Admit: 2016-10-31 | Discharge: 2016-10-31 | Disposition: A | Discharge status: Home / Self Care | Payer: Medicare Other | Source: Ambulatory Visit | Attending: Internal Medicine | Admitting: Internal Medicine

## 2016-10-31 ENCOUNTER — Ambulatory Visit (INDEPENDENT_AMBULATORY_CARE_PROVIDER_SITE_OTHER): Payer: Medicare Other | Admitting: Cardiovascular Disease

## 2016-10-31 ENCOUNTER — Encounter: Payer: Self-pay | Admitting: Cardiovascular Disease

## 2016-10-31 ENCOUNTER — Ambulatory Visit (INDEPENDENT_AMBULATORY_CARE_PROVIDER_SITE_OTHER): Payer: Medicare Other | Admitting: Internal Medicine

## 2016-10-31 VITALS — BP 122/80 | HR 57 | Ht 71.0 in | Wt 271.6 lb

## 2016-10-31 VITALS — BP 130/80 | HR 62 | Ht 71.0 in | Wt 271.8 lb

## 2016-10-31 DIAGNOSIS — Z23 Encounter for immunization: Secondary | ICD-10-CM | POA: Diagnosis not present

## 2016-10-31 DIAGNOSIS — J45901 Unspecified asthma with (acute) exacerbation: Secondary | ICD-10-CM

## 2016-10-31 DIAGNOSIS — R079 Chest pain, unspecified: Secondary | ICD-10-CM | POA: Diagnosis not present

## 2016-10-31 DIAGNOSIS — J209 Acute bronchitis, unspecified: Secondary | ICD-10-CM

## 2016-10-31 DIAGNOSIS — J45909 Unspecified asthma, uncomplicated: Secondary | ICD-10-CM | POA: Diagnosis not present

## 2016-10-31 DIAGNOSIS — I480 Paroxysmal atrial fibrillation: Secondary | ICD-10-CM | POA: Diagnosis not present

## 2016-10-31 MED ORDER — LOSARTAN POTASSIUM 100 MG PO TABS: 100.0000 mg | ORAL_TABLET | Freq: Every day | ORAL | 1 refills | Status: DC

## 2016-10-31 MED ORDER — METHYLPREDNISOLONE ACETATE 80 MG/ML IJ SUSP: 80.0000 mg | Freq: Once | INTRAMUSCULAR | Status: DC

## 2016-10-31 MED ORDER — METOPROLOL SUCCINATE ER 100 MG PO TB24: 100.0000 mg | ORAL_TABLET | Freq: Every evening | ORAL | 1 refills | Status: DC

## 2016-10-31 MED ORDER — OMEPRAZOLE 40 MG PO CPDR: 40.0000 mg | DELAYED_RELEASE_CAPSULE | Freq: Two times a day (BID) | ORAL | 1 refills | Status: AC

## 2016-10-31 NOTE — Progress Notes (Signed)
  History of Present Illness: . male former smoker with asthma/chronic bronchitis and allergic rhinitis, OSA PAF/ anticoagulation, toxic encephalopathy, light sensitivity, chronic fatigue/fibromyalgia  ------------------------------------------------------------------  12/08/13- 62 yoM former smoker followed for  OSA, allergic rhinitis/ quit vaccine, hx asthma/ bronchitis   Wife here CPAP 12/ APS- good compliance and control. Follows For: Rash on lower legs and arms - Occas notices welps and scaley - Has noticed this for past month. New dry, pruritic rash on lower calves. Breathing is comfortable.  10/31/2016-66 year old male former smoker followed forasthma/chronic bronchitis and allergic rhinitis,  OSA complicated by PAF/ anticoagulation, aortic aneurysm, toxic encephalopathy, light sensitivity, chronic fatigue/fibromyalgia CPAP 12/APS FOLLOWS FOR:Pt states he has cough, lung pain since Thanksgiving. Pt was sent here by Cardiology/ DrNishan's office for Depo Injection. Pt wants flu shot today as well. Recent asthmatic bronchitis treated last week with Z-Pak. Coughing since Thanksgiving. Some scant sputum, sore throat, initial fever without body aches.  ROS-see HPI Constitutional:   No-   weight loss, night sweats, fevers, chills, fatigue, lassitude. HEENT:   No-  headaches, difficulty swallowing, tooth/dental problems, sore throat,       No-  sneezing, itching, ear ache,                    No-nasal congestion, post nasal drip,  CV:  No-   chest pain, orthopnea, PND, swelling in lower extremities, anasarca,  dizziness, palpitations Resp: No- coughing up of blood.              No-   change in color of mucus.  No- wheezing.   Skin: Clear GI:  No-   heartburn, indigestion, abdominal pain, nausea, vomiting,  GU:  MS:  No-   joint pain or swelling.   Neuro-     nothing unusual Psych:  No- change in mood or affect. + depression or anxiety.  No memory loss.  OBJ- Physical Exam General- Alert,  Oriented, Affect-appropriate, Distress- none acute. Sunglasses, + overweight Skin- +eczematoid patches on lower calves, nonspecific, excoriated Lymphadenopathy- none Head- atraumatic. Stares aimlessly around room like a blind person.             Eyes- Gross vision intact, PERRLA, conjunctivae and secretions clear            Ears- Hearing, canals-normal            Nose- Clear, no-Septal dev, mucus, polyps, erosion, perforation             Throat- Mallampati II , mucosa +red , drainage- none, tonsils- atrophic Neck- flexible , trachea midline, no stridor , thyroid nl, carotid no bruit Chest - symmetrical excursion , unlabored           Heart/CV- RRR , no murmur , no gallop  , no rub, nl s1 s2                           - JVD- none , edema- none, stasis changes- none, varices- none           Lung- +coarse unlabored, wheeze- none, cough + raspy , dullness-none, rub- none           Chest wall-  Abd-  Br/ Gen/ Rectal- Not done, not indicated Extrem- cyanosis- none, clubbing, none, atrophy- none, strength- nl Neuro- grossly intact to observation

## 2016-10-31 NOTE — Patient Instructions (Addendum)
Medication Instructions:  Same-no changes  Labwork: None  Testing/Procedures: None  Follow-Up: Your physician wants you to follow-up in: 6 months. You will receive a reminder letter in the mail two months in advance. If you don't receive a letter, please call our office to schedule the follow-up appointment.      If you need a refill on your cardiac medications before your next appointment, please call your pharmacy.   

## 2016-10-31 NOTE — Patient Instructions (Signed)
Depo 80   Dx Asthma exacerbation, acute bronchitis  Flu vax  Order- CXR    Dx acute bronchitis  Stay well hydrated. Watch out for temperature swings outdoors- it will be getting colder again.  Keep appointment in March

## 2016-11-02 NOTE — Assessment & Plan Note (Signed)
Viral pattern exacerbation Plan-CXR, Depo-Medrol, encourage fluids, throat lozenges, OTC cough syrup

## 2016-11-03 ENCOUNTER — Telehealth: Payer: Self-pay | Admitting: Internal Medicine

## 2016-11-03 NOTE — Telephone Encounter (Signed)
lmomtcb x1 

## 2016-11-03 NOTE — Telephone Encounter (Signed)
Notes Recorded by Deneise Lever, MD on 10/31/2016 at 11:57 AM EST CXR- lungs are clear. Arthritis changes in spine.  Spoke with pt's spouse (pt gave verbal). Pt spouse is made aware of results and had no further questions. Nothing further needed.

## 2016-11-09 ENCOUNTER — Other Ambulatory Visit: Payer: Self-pay | Admitting: Internal Medicine

## 2016-11-12 DIAGNOSIS — L570 Actinic keratosis: Secondary | ICD-10-CM | POA: Diagnosis not present

## 2016-11-18 ENCOUNTER — Telehealth: Payer: Self-pay | Admitting: Internal Medicine

## 2016-11-19 MED ORDER — METHYLPREDNISOLONE 4 MG PO TABS: ORAL_TABLET | ORAL | 0 refills | Status: DC

## 2016-11-19 MED ORDER — BENZONATATE 200 MG PO CAPS: 200.0000 mg | ORAL_CAPSULE | Freq: Four times a day (QID) | ORAL | 0 refills | Status: DC | PRN

## 2016-11-19 NOTE — Telephone Encounter (Signed)
Offer prednisone 10 mg     2 daily x 3 days, then 1 daily x 3 days     # 9, no ref           Tessalon perles 200 mg, # 30,   1 every 6 hours if needed for cough    No ref

## 2016-11-19 NOTE — Telephone Encounter (Signed)
Spoke with pt's spouse and made her aware of CY's recommendations. Pt wife has requested that pt come in for medrol shot, as prednisone makes him unable to sleep well. Rx sent to preferred pharmacy for tessalone pearls  CY please advise. Thanks.

## 2016-11-19 NOTE — Telephone Encounter (Signed)
Called and spoke with pts wife and she stated that the pt is still having a severe cough that he cannot shake.  She stated that sometimes this cough is violent.  Pt is coughing up white sputum only.  She stated that he does not have a fever or sore throat.  They wanted to see what CY recs for this.  Please advise. Thanks  Last ov--10/31/16 Next ov--12/29/16  Allergies  Allergen Reactions  . Codeine Itching

## 2016-11-19 NOTE — Telephone Encounter (Signed)
Spoke with the pt and notified of recs per CDY  Pt verbalized understanding and rx was sent to pharm

## 2016-11-19 NOTE — Telephone Encounter (Signed)
Suggest we try changing prednisone to medrol 4 mg,  # 9       2 daily x 3 days, then one daily x 3 days  This is the drug that is in the depomedrol steroid shot, and worth a try to see if it is tolerated better.

## 2016-12-03 DIAGNOSIS — R310 Gross hematuria: Secondary | ICD-10-CM | POA: Diagnosis not present

## 2016-12-08 DIAGNOSIS — R310 Gross hematuria: Secondary | ICD-10-CM | POA: Diagnosis not present

## 2016-12-08 DIAGNOSIS — R319 Hematuria, unspecified: Secondary | ICD-10-CM | POA: Diagnosis not present

## 2016-12-17 DIAGNOSIS — L570 Actinic keratosis: Secondary | ICD-10-CM | POA: Diagnosis not present

## 2016-12-17 DIAGNOSIS — L719 Rosacea, unspecified: Secondary | ICD-10-CM | POA: Diagnosis not present

## 2016-12-25 DIAGNOSIS — G4733 Obstructive sleep apnea (adult) (pediatric): Secondary | ICD-10-CM | POA: Diagnosis not present

## 2016-12-29 ENCOUNTER — Ambulatory Visit: Payer: Medicare Other | Admitting: Internal Medicine

## 2017-01-14 ENCOUNTER — Emergency Department (HOSPITAL_COMMUNITY): Payer: Medicare Other

## 2017-01-14 ENCOUNTER — Encounter (HOSPITAL_COMMUNITY): Payer: Self-pay | Admitting: Emergency Medicine

## 2017-01-14 ENCOUNTER — Emergency Department (HOSPITAL_COMMUNITY)
Admission: EM | Admit: 2017-01-14 | Discharge: 2017-01-14 | Disposition: A | Discharge status: Home / Self Care | Payer: Medicare Other | Attending: Emergency Medicine | Admitting: Emergency Medicine

## 2017-01-14 DIAGNOSIS — R1010 Upper abdominal pain, unspecified: Secondary | ICD-10-CM | POA: Insufficient documentation

## 2017-01-14 DIAGNOSIS — E039 Hypothyroidism, unspecified: Secondary | ICD-10-CM | POA: Diagnosis not present

## 2017-01-14 DIAGNOSIS — R109 Unspecified abdominal pain: Secondary | ICD-10-CM

## 2017-01-14 DIAGNOSIS — Z87891 Personal history of nicotine dependence: Secondary | ICD-10-CM | POA: Insufficient documentation

## 2017-01-14 LAB — URINALYSIS, ROUTINE W REFLEX MICROSCOPIC
Bilirubin Urine: NEGATIVE
Glucose, UA: NEGATIVE mg/dL
Hgb urine dipstick: NEGATIVE
Ketones, ur: NEGATIVE mg/dL
Leukocytes, UA: NEGATIVE
Nitrite: NEGATIVE
Protein, ur: NEGATIVE mg/dL
Specific Gravity, Urine: 1.013 (ref 1.005–1.030)
pH: 7 (ref 5.0–8.0)

## 2017-01-14 LAB — COMPREHENSIVE METABOLIC PANEL
ALT: 46 U/L (ref 17–63)
AST: 85 U/L — ABNORMAL HIGH (ref 15–41)
Albumin: 3.9 g/dL (ref 3.5–5.0)
Alkaline Phosphatase: 71 U/L (ref 38–126)
Anion gap: 7 (ref 5–15)
BUN: 12 mg/dL (ref 6–20)
CO2: 28 mmol/L (ref 22–32)
Calcium: 8.9 mg/dL (ref 8.9–10.3)
Chloride: 103 mmol/L (ref 101–111)
Creatinine, Ser: 0.93 mg/dL (ref 0.61–1.24)
GFR calc Af Amer: >60 mL/min (ref >60)
GFR calc non Af Amer: >60 mL/min (ref >60)
Glucose, Bld: 102 mg/dL — ABNORMAL HIGH (ref 65–99)
Potassium: 3.7 mmol/L (ref 3.5–5.1)
Sodium: 138 mmol/L (ref 135–145)
Total Bilirubin: 0.8 mg/dL (ref 0.3–1.2)
Total Protein: 6.8 g/dL (ref 6.5–8.1)

## 2017-01-14 LAB — CBC WITH DIFFERENTIAL/PLATELET
Basophils Absolute: 0 10*3/uL (ref 0.0–0.1)
Basophils Relative: 0 %
Eosinophils Absolute: 0.2 10*3/uL (ref 0.0–0.7)
Eosinophils Relative: 2 %
HCT: 42.8 % (ref 39.0–52.0)
Hemoglobin: 14.9 g/dL (ref 13.0–17.0)
Lymphocytes Relative: 17 %
Lymphs Abs: 1.6 10*3/uL (ref 0.7–4.0)
MCH: 31.4 pg (ref 26.0–34.0)
MCHC: 34.8 g/dL (ref 30.0–36.0)
MCV: 90.1 fL (ref 78.0–100.0)
Monocytes Absolute: 1 10*3/uL (ref 0.1–1.0)
Monocytes Relative: 11 %
Neutro Abs: 6.7 10*3/uL (ref 1.7–7.7)
Neutrophils Relative %: 70 %
Platelets: 189 10*3/uL (ref 150–400)
RBC: 4.75 MIL/uL (ref 4.22–5.81)
RDW: 12.7 % (ref 11.5–15.5)
WBC: 9.5 10*3/uL (ref 4.0–10.5)

## 2017-01-14 LAB — I-STAT CHEM 8, ED
BUN: 11 mg/dL (ref 6–20)
Calcium, Ion: 1.13 mmol/L — ABNORMAL LOW (ref 1.15–1.40)
Chloride: 99 mmol/L — ABNORMAL LOW (ref 101–111)
Creatinine, Ser: 1 mg/dL (ref 0.61–1.24)
Glucose, Bld: 101 mg/dL — ABNORMAL HIGH (ref 65–99)
HCT: 45 % (ref 39.0–52.0)
Hemoglobin: 15.3 g/dL (ref 13.0–17.0)
Potassium: 3.8 mmol/L (ref 3.5–5.1)
Sodium: 139 mmol/L (ref 135–145)
TCO2: 30 mmol/L (ref 0–100)

## 2017-01-14 LAB — I-STAT TROPONIN, ED: Troponin i, poc: 0 ng/mL (ref 0.00–0.08)

## 2017-01-14 LAB — LIPASE, BLOOD: Lipase: 58 U/L — ABNORMAL HIGH (ref 11–51)

## 2017-01-14 MED ORDER — IOPAMIDOL (ISOVUE-300) INJECTION 61%
INTRAVENOUS | Status: AC
  Administered 2017-01-14: 100 mL
  Filled 2017-01-14: qty 100

## 2017-01-14 NOTE — ED Provider Notes (Signed)
[facility] DEPT Provider Note   CSN: [id number] Arrival date & time: 01/14/17  1419     History   Chief Complaint Chief Complaint  Patient presents with  . Abdominal Pain    HPI Grant [name] is a 66 y.o. male.  The history is provided by the patient and medical records.  Abdominal Pain      65 year old male with history of anxiety, depression, fibromyalgia, GERD, hypothyroidism, paroxysmal A. fib on xarelto, presenting to the ED for abdominal pain. Patient reports he ate a salad for lunch and started having sharp, upper abdominal pain about 15-20 minutes ago. States it felt sharp and his belly felt distended.  States the pain got severe and "took his breath away" but denied any chest pain.  States he drank some simethicone on but did not notice any improvement so he took a shot of whiskey which he thinks did help his pain. States currently he feels much better. He denies any nausea, vomiting, or diarrhea. No fever or chills. No recent urinary symptoms. Patient does have a known AAA that is about 4.9cm on last measure in November 2017.  This is under surveillance by Dr. [name].  Patient does take xarelto regularly.  Prior abdominal surgeries include cholecystectomy and hernia repair.  Past Medical History:  Diagnosis Date  . Allergic rhinitis   . Anxiety   . Bladder tumor   . Chronic fatigue   . Chronic fatigue   . Depression   . Fibromyalgia   . GERD (gastroesophageal reflux disease)   . History of chronic bronchitis   . History of migraine   . History of toxic encephalopathy   . Hypothyroidism   . OSA on CPAP    CPAP 14  . PAF (paroxysmal atrial fibrillation) ([place]) CARDIOLOGIST -- DR [name]   DX OCT 2013  . 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 Unspecified essential hypertension   . Urethral tumor    PROSTATIC    Patient Active Problem List   Diagnosis Date Noted  . Eczema 01/01/2014  . Hoarseness or changing voice 07/24/2013  . PAF (paroxysmal atrial fibrillation)  ([place]) 05/18/2013  . Chronic anticoagulation 05/18/2013  . Preop cardiovascular exam 05/18/2013  . Macular rash 08/10/2012  . Acute asthmatic bronchitis 05/05/2012  . ESSENTIAL HYPERTENSION, BENIGN 06/17/2010  . SYNCOPE 06/14/2010  . DEPRESSION 02/11/2008  . Seasonal and perennial allergic rhinitis 02/11/2008  . Chronic bronchitis ([place]) 02/11/2008  . REFLUX ESOPHAGITIS, HX OF 02/11/2008    Past Surgical History:  Procedure Laterality Date  . CYSTOSCOPY W/ RETROGRADES Bilateral 06/15/2013   Procedure: CYSTOSCOPY WITH BILATERAL RETROGRADE PYELOGRAM  BLADDER BIOPSY, PROSTATIC URETHRAL BIOPSY, ;  Surgeon: Molli Hazard, MD;  Location: [facility];  Service: Urology;  Laterality: Bilateral;  . LAPAROSCOPIC CHOLECYSTECTOMY  01-14-2001  . UMBILICAL HERNIA REPAIR  01-14-2008       Home Medications    Prior to Admission medications   Medication Sig Start Date End Date Taking? Authorizing Provider  ascorbic acid (C-1000/ROSE HIPS) 1000 MG tablet Take 1,000 mg by mouth daily.    Historical Provider, MD  azelastine (ASTELIN) 137 MCG/SPRAY nasal spray Place 1-2 sprays into the nose 2 (two) times daily. Use in each nostril as directed    Historical Provider, MD  azithromycin (ZITHROMAX) 500 MG tablet as needed.  01/14/14   Historical Provider, MD  benzonatate (TESSALON) 100 MG capsule Take 100-200 mg by mouth as directed. Take 1 to 2 capsules every 3 to 4 hours as  needed    Historical Provider, MD  benzonatate (TESSALON) 200 MG capsule Take 1 capsule (200 mg total) by mouth every 6 (six) hours as needed for cough. 11/19/16   Deneise Lever, MD  Cholecalciferol (VITAMIN D-3) 5000 UNITS TABS Take 1 tablet by mouth 2 (two) times daily.    Historical Provider, MD  COLCRYS 0.6 MG tablet Take 0.6 mg by mouth every morning. Once a day 01/29/12   Historical Provider, MD  dextromethorphan (DELSYM) 30 MG/5ML liquid Take 30 mg by mouth at bedtime. As needed for coughing    Historical  Provider, MD  DHEA 50 MG TABS Take 1 tablet by mouth daily.    Historical Provider, MD  fexofenadine (ALLEGRA) 180 MG tablet Take 180 mg by mouth daily.    Historical Provider, MD  finasteride (PROSCAR) 5 MG tablet Take 5 mg by mouth daily.  01/14/14   Historical Provider, MD  levothyroxine (SYNTHROID, LEVOTHROID) 112 MCG tablet Take 112 mcg by mouth daily before breakfast.     Historical Provider, MD  liothyronine (CYTOMEL) 25 MCG tablet Take 25 mcg by mouth every morning.     Historical Provider, MD  losartan (COZAAR) 100 MG tablet Take 1 tablet (100 mg total) by mouth daily. 10/31/16   Josue Hector, MD  methylPREDNISolone (MEDROL) 4 MG tablet 2 x 3 days, 1 x 3 days, then stop 11/19/16   Deneise Lever, MD  metoprolol succinate (TOPROL-XL) 100 MG 24 hr tablet Take 1 tablet (100 mg total) by mouth every evening. Take with or immediately following a meal. 10/31/16   Josue Hector, MD  Multiple Vitamins-Minerals (ONE-A-DAY MENS HEALTH FORMULA PO) Take 1 tablet by mouth daily.    Historical Provider, MD  neomycin-polymyxin-hydrocortisone (CORTISPORIN) 3.5-10000-1 otic suspension Place 4 drops in ear(s) 3 (three) times daily. When ear is infected    Historical Provider, MD  omeprazole (PRILOSEC) 40 MG capsule Take 1 capsule (40 mg total) by mouth 2 (two) times daily. 10/31/16   Josue Hector, MD  perphenazine (TRILAFON) 2 MG tablet Take 2 mg by mouth 2 (two) times daily as needed. As needed    Historical Provider, MD  Probiotic Product (ALIGN) 4 MG CAPS Take 1 capsule by mouth daily.      Historical Provider, MD  SYMBICORT 160-4.5 MCG/ACT inhaler INHALE 2 PUFF INTO THE LUNGS 2 TIMES A DAY. 09/10/15   Deneise Lever, MD  tamsulosin (FLOMAX) 0.4 MG CAPS capsule Take 0.4 mg by mouth daily after supper.    Historical Provider, MD  testosterone cypionate (DEPOTESTOTERONE CYPIONATE) 100 MG/ML injection Inject 200 mg into the muscle every 7 (seven) days. For IM use only    Historical Provider, MD    venlafaxine (EFFEXOR-XR) 150 MG 24 hr capsule Take 150 mg by mouth every morning.     Historical Provider, MD  VENTOLIN HFA 108 (90 Base) MCG/ACT inhaler INHALE 2 PUFFS INTO LUNGS EVERY 6 HOURS AS NEEDED FOR WHEEZING OR SHORTNESS OF BREATH 11/10/16   Deneise Lever, MD  vitamin B-12 (CYANOCOBALAMIN) 1000 MCG tablet Take 1,000 mcg by mouth daily.      Historical Provider, MD  XARELTO 20 MG TABS tablet Take 1 tablet (20 mg total) by mouth every evening. 08/21/16   Isaiah Serge, NP    Family History Family History  Problem Relation Age of Onset  . Aortic aneurysm Mother 40    cause of death  . Other Father     motor vehicle accident  .  Heart disease      family history    Social History Social History  Substance Use Topics  . Smoking status: Former Smoker    Packs/day: 0.50    Years: 27.00    Types: Cigarettes    Quit date: 10/21/1979  . Smokeless tobacco: Never Used  . Alcohol use 12.6 oz/week    21 Cans of beer per week     Allergies   Codeine   Review of Systems Review of Systems  Gastrointestinal: Positive for abdominal pain.  All other systems reviewed and are negative.    Physical Exam Updated Vital Signs BP (!) 151/93   Pulse 60   Temp 97.8 F (36.6 C) (Oral)   Resp 17   Ht 5\' 10"  (1.778 m)   Wt 122.5 kg   SpO2 97%   BMI 38.74 kg/m   Physical Exam  Constitutional: He is oriented to person, place, and time. He appears well-developed and well-nourished.  Lying in bed, NAD, good coloration  HENT:  Head: Normocephalic and atraumatic.  Mouth/Throat: Oropharynx is clear and moist.  Eyes: Conjunctivae and EOM are normal. Pupils are equal, round, and reactive to light.  Neck: Normal range of motion.  Cardiovascular: Normal rate, regular rhythm and normal heart sounds.   Pulmonary/Chest: Effort normal and breath sounds normal.  Abdominal: Soft. Bowel sounds are normal. There is no tenderness. There is no rigidity.  Obese abdomen which is soft, normal  bowel sounds, no apparent focal tenderness on exam, no mottling  Musculoskeletal: Normal range of motion.  DP pulses intact bilaterally  Neurological: [name], place, and time.  Skin: Skin is warm and dry.  Psychiatric: He has a normal mood and affect.  Nursing note and vitals reviewed.    ED Treatments / Results  Labs (all labs ordered are listed, but only abnormal results are displayed) Labs Reviewed  LIPASE, BLOOD - Abnormal; Notable for the following:       Result Value   Lipase 58 (*)    All other components within normal limits  COMPREHENSIVE METABOLIC PANEL - Abnormal; Notable for the following:    Glucose, Bld 102 (*)    AST 85 (*)    All other components within normal limits  URINALYSIS, ROUTINE W REFLEX MICROSCOPIC - Abnormal; Notable for the following:    Color, Urine STRAW (*)    All other components within normal limits  I-STAT CHEM 8, ED - Abnormal; Notable for the following:    Chloride 99 (*)    Glucose, Bld 101 (*)    Calcium, Ion 1.13 (*)    All other components within normal limits  CBC WITH DIFFERENTIAL/PLATELET  Randolm Idol, ED    EKG  EKG Interpretation  Date/Time:  Wednesday January 14 2017 14:24:22 EDT Ventricular Rate:  65 PR Interval:    QRS Duration: 104 QT Interval:  430 QTC Calculation: 448 R Axis:   17 Text Interpretation:  Sinus rhythm Confirmed by Lita Mains  MD, DAVID ([id number]) on 01/14/2017 4:29:17 PM       Radiology Ct Abdomen Pelvis W Contrast  Result Date: 01/14/2017 CLINICAL DATA:  Mid abdominal pain and distension. Known abdominal aortic aneurysm. EXAM: CT ABDOMEN AND PELVIS WITH CONTRAST TECHNIQUE: Multidetector CT imaging of the abdomen and pelvis was performed using the standard protocol following bolus administration of intravenous contrast. CONTRAST:  156mL ISOVUE-300 IOPAMIDOL (ISOVUE-300) INJECTION 61% COMPARISON:  Previous examinations, the most recent dated 12/08/2016. FINDINGS: Lower chest:  Clear lung  bases. Hepatobiliary: Cholecystectomy clips.  Unremarkable liver. Pancreas: Unremarkable. No pancreatic ductal dilatation or surrounding inflammatory changes. Spleen: Stable small rounded area of low density in the posterior spleen. Mild heterogeneity of the anterior spleen is again demonstrated. Similar changes on 04/13/2013. The spleen is normal in size and shape. Adrenals/Urinary Tract: Adrenal glands are unremarkable. Kidneys are normal, without renal calculi, focal lesion, or hydronephrosis. Bladder is unremarkable. Stomach/Bowel: Large number of sigmoid and descending colon diverticula. No evidence of diverticulitis. Normal appearing appendix, small bowel and stomach. Vascular/Lymphatic: Atheromatous arterial calcifications, including the abdominal aorta. No abdominal aortic aneurysm seen. Borderline enlarged heart. No enlarged lymph nodes. Reproductive: Moderately enlarged prostate gland. Other: None. Musculoskeletal: Lumbar and lower thoracic spine degenerative changes. IMPRESSION: 1. No acute abnormality. 2. Extensive colonic diverticulosis. 3. Aortic atherosclerosis without aneurysm. 4. Stable moderately enlarged prostate gland. Electronically Signed   By: Claudie Revering M.D.   On: 01/14/2017 15:21    Procedures Procedures (including critical care time)  Medications Ordered in ED Medications - No data to display   Initial Impression / Assessment and Plan / ED Course  I have reviewed the triage vital signs and the nursing notes.  Pertinent labs & imaging results that were available during my care of the patient were reviewed by me and considered in my medical decision making (see chart for details).  66 year old male here with abdominal pain. Started about 1520 minutes prior to arrival, but has resolved by time of my evaluation. He is resting comfortably in bed.  His abdomen is soft and nontender. Vital signs are stable. He does report pain resolved after taking a shot of 50 which  has worked for him in the past. Worthy Keeler is overall reassuring, no leukocytosis. Troponin is negative. UA without signs of infection. CT scan obtained without any acute findings. Patient has remained stable here, continues to deny any pain while in the ED. He has tolerated oral fluids well without issue. His abdomen remains soft and benign on assessment. Feel he is stable for discharge. Will have him follow-up closely with his primary care doctor.  Discussed plan with patient and wife, they acknowledged understanding and agreed with plan of care.  Return precautions given for new or worsening symptoms.  Final Clinical Impressions(s) / ED Diagnoses   Final diagnoses:  Abdominal pain, unspecified abdominal location    New Prescriptions Discharge Medication List as of 01/14/2017  5:12 PM       Larene Pickett, PA-C 01/14/17 1836    Julianne Rice, MD 01/15/17 1524

## 2017-01-14 NOTE — ED Notes (Signed)
Provided ice water with permission from [facility].

## 2017-01-14 NOTE — Discharge Instructions (Signed)
Labs, urine, and CT scan today looked good-- no acute abnormalities noted. Recommend to follow-up with your primary care doctor. Return here for any new or worsening symptoms-- severe pain, nausea, vomiting, high fever, etc.

## 2017-01-14 NOTE — ED Triage Notes (Signed)
Patient has a hx of a AAA. Patient is complaining of abdomen pain. Patient states that he usually drinks some whiskey and it goes away but today it did not go away. Patient states it started about 30 minutes ago.

## 2017-01-14 NOTE — ED Notes (Signed)
Requested urine from patient. Patient given urinal.

## 2017-01-14 NOTE — ED Notes (Signed)
PT have been made aware of urine sample. RN and PA at bedside

## 2017-01-19 ENCOUNTER — Encounter: Payer: Self-pay | Admitting: Internal Medicine

## 2017-01-19 ENCOUNTER — Other Ambulatory Visit: Payer: Self-pay | Admitting: Internal Medicine

## 2017-01-19 ENCOUNTER — Ambulatory Visit (INDEPENDENT_AMBULATORY_CARE_PROVIDER_SITE_OTHER): Payer: Medicare Other | Admitting: Internal Medicine

## 2017-01-19 VITALS — BP 124/76 | HR 68 | Ht 71.0 in | Wt 276.6 lb

## 2017-01-19 DIAGNOSIS — J42 Unspecified chronic bronchitis: Secondary | ICD-10-CM

## 2017-01-19 DIAGNOSIS — G4733 Obstructive sleep apnea (adult) (pediatric): Secondary | ICD-10-CM

## 2017-01-19 MED ORDER — ALBUTEROL SULFATE HFA 108 (90 BASE) MCG/ACT IN AERS: INHALATION_SPRAY | RESPIRATORY_TRACT | 12 refills | Status: DC

## 2017-01-19 MED ORDER — BUDESONIDE-FORMOTEROL FUMARATE 160-4.5 MCG/ACT IN AERO: INHALATION_SPRAY | RESPIRATORY_TRACT | 12 refills | Status: DC

## 2017-01-19 NOTE — Patient Instructions (Signed)
Inhaler refills sent  Order- schedule unattended home sleep test      Dx OSA  After we get the results of the sleep study, we anticipate re-ordering CPAP though a new DME   Please call as needed

## 2017-01-19 NOTE — Progress Notes (Signed)
History of Present Illness: . male former smoker with asthma/chronic bronchitis and allergic rhinitis, OSA PAF/ anticoagulation, toxic encephalopathy, light sensitivity, chronic fatigue/fibromyalgia NPSG 03/24/03- AHI 88/ hr, desaturation to 75%, body weight 244 lbs ------------------------------------------------------------------  12/08/13- 62 yoM former smoker followed for  OSA, allergic rhinitis/ quit vaccine, hx asthma/ bronchitis   Wife here CPAP 12/ [facility]- good compliance and control. Follows For: Rash on lower legs and arms - Occas notices welps and scaley - Has noticed this for past month. New dry, pruritic rash on lower calves. Breathing is comfortable.  10/31/2016-66 year old male former smoker followed forasthma/chronic bronchitis and allergic rhinitis,  OSA complicated by PAF/ anticoagulation, aortic aneurysm, toxic encephalopathy, light sensitivity, chronic fatigue/fibromyalgia CPAP 12/[facility] FOLLOWS FOR:Pt states he has cough, lung pain since Thanksgiving. Pt was sent here by Cardiology/ DrNishan's office for Depo Injection. Pt wants flu shot today as well. Recent asthmatic bronchitis treated last week with Z-Pak. Coughing since Thanksgiving. Some scant sputum, sore throat, initial fever without body aches.  01/19/2017-66 year old male former smoker followed forasthma/chronic bronchitis and allergic rhinitis,  OSA complicated by PAF/ anticoagulation, aortic aneurysm, toxic encephalopathy, light sensitivity, chronic fatigue/fibromyalgia CPAP 12/[facility] FOLLOWS FOR: DME:[facility]. Pt states he wears CPAP nightly and pressure working well for patient. Pt needs new headgear with nasal pillows. No DL and not in AV.  Current CPAP machine is quite old. We discussed updating documentation for home sleep test before changing DME companies and getting a new machine. Increasing rhinorrhea with spring pollen season. There would like to try Allegra instead of Claritin. Always "some cough" but he seems to  feel well controlled. Continue Symbicort daily, rescue inhaler only occasionally.  He can feel palpitation when in atrial fibrillation. CXR 10/31/2016- No active cardiopulmonary disease.  // Will be needing office spirometry for documentation next visit//  ROS-see HPI     + = pos Constitutional:   No-   weight loss, night sweats, fevers, chills, fatigue, lassitude. HEENT:   No-  headaches, difficulty swallowing, tooth/dental problems, sore throat,       No-  sneezing, itching, ear ache,                    No-nasal congestion, + post nasal drip,  CV:  No-   chest pain, orthopnea, PND, swelling in lower extremities, anasarca,  dizziness, palpitations Resp: No- coughing up of blood.              No-   change in color of mucus.  No- wheezing.   Skin: Clear GI:  No-   heartburn, indigestion, abdominal pain, nausea, vomiting,  GU:  MS:  No-   joint pain or swelling.   Neuro-     nothing unusual Psych:  No- change in mood or affect. + depression or anxiety.  No memory loss.  OBJ- Physical Exam General- Alert, Oriented, Affect-appropriate, Distress- none acute. Sunglasses, + overweight Skin- +eczematoid patches on lower calves, nonspecific, excoriated Lymphadenopathy- none Head- atraumatic. Stares aimlessly around room like a blind person.             Eyes- Gross vision intact, PERRLA, conjunctivae and secretions clear            Ears- Hearing, canals-normal            Nose- Clear, no-Septal dev, mucus, polyps, erosion, perforation             Throat- Mallampati II , mucosa +red , drainage- none, tonsils- atrophic Neck- flexible , trachea midline, no stridor ,  thyroid nl, carotid no bruit Chest - symmetrical excursion , unlabored           Heart/CV- RRR , no murmur , no gallop  , no rub, nl s1 s2                           - JVD- none , edema- none, stasis changes- none, varices- none           Lung- + clear/ unlabored, wheeze- none, cough -none , dullness-none, rub- none           Chest  wall-  Abd-  Br/ Gen/ Rectal- Not done, not indicated Extrem- cyanosis- none, clubbing, none, atrophy- none, strength- nl Neuro- grossly intact to observation

## 2017-01-19 NOTE — Telephone Encounter (Signed)
lmtcb X1 for pt's wife I do not see where an abx was written at today's office visit note.

## 2017-01-19 NOTE — Assessment & Plan Note (Signed)
During completion after patient left I recognized we don't have a recorded spirometry assessment in EMR. He is a former smoker with a mild intermittent chronic asthmatic bronchitis. We will arrange spirometry next visit. It may prove that a better diagnosis would be COPD mixed type on future consideration. Plan-we will try to get an office spirometry next visit. Meanwhile refill for his inhalers was sent to his drugstore.

## 2017-01-20 MED ORDER — AZITHROMYCIN 250 MG PO TABS: ORAL_TABLET | ORAL | 1 refills | Status: DC

## 2017-01-20 MED ORDER — AZITHROMYCIN 250 MG PO TABS: ORAL_TABLET | ORAL | 1 refills | Status: AC

## 2017-01-20 NOTE — Telephone Encounter (Signed)
Rx sent to preferred to pharmacy. Pt's spouse aware and voiced her understanding. Nothing further needed.

## 2017-01-20 NOTE — Telephone Encounter (Signed)
Spoke with pt's spouse, who states during pt's OV yesterday CY mentioned witting an RX for abx to keep on hand. Pt plans to go out of town on Thursday, and feels that he may be developing an sinus infection.  CY please advise. Thanks.

## 2017-01-20 NOTE — Telephone Encounter (Signed)
Offer Zpak    250 mg, # 6, 2 today then one daily      Ref x 1

## 2017-01-20 NOTE — Telephone Encounter (Signed)
Patient wife calling back - she can be reached at [phone]

## 2017-02-12 ENCOUNTER — Telehealth: Payer: Self-pay | Admitting: Internal Medicine

## 2017-02-12 NOTE — Telephone Encounter (Signed)
Spoke with CY-states this is fine and reminder sent to both of [place] to call patient with HST results once are ready. Wife is aware, appt cancelled, and nothing more needed at this time.

## 2017-02-18 DIAGNOSIS — G4733 Obstructive sleep apnea (adult) (pediatric): Secondary | ICD-10-CM | POA: Diagnosis not present

## 2017-03-04 ENCOUNTER — Ambulatory Visit: Payer: Medicare Other | Admitting: Internal Medicine

## 2017-03-04 DIAGNOSIS — G4733 Obstructive sleep apnea (adult) (pediatric): Secondary | ICD-10-CM

## 2017-03-05 ENCOUNTER — Other Ambulatory Visit: Payer: Self-pay | Admitting: *Deleted

## 2017-03-05 DIAGNOSIS — G4733 Obstructive sleep apnea (adult) (pediatric): Secondary | ICD-10-CM

## 2017-03-09 ENCOUNTER — Encounter: Payer: Self-pay | Admitting: Cardiovascular Disease

## 2017-04-08 ENCOUNTER — Telehealth: Payer: Self-pay | Admitting: Internal Medicine

## 2017-04-08 DIAGNOSIS — G4733 Obstructive sleep apnea (adult) (pediatric): Secondary | ICD-10-CM | POA: Diagnosis not present

## 2017-04-08 NOTE — Telephone Encounter (Signed)
Pt's spouse, Juliann Pulse, is aware of results and voiced her understanding. Juliann Pulse wishes for pt to switch DME company's. Juliann Pulse does not wish for pt to switch to [facility].  Order has been placed. Nothing further needed.   Notes recorded by Deneise Lever, MD on 04/03/2017 at 3:30 PM EDT Home Sleep Test- confirmed severe obstructive sleep apnea, stopping breathing 48 times per hour. I believe he wanted to establish with a new DME (was [facility]) and he needs replacement of old CPAP machine, auto 5-20, mask of choice, humidifier, supplies, AirView  Dx OSA He will need return ov with me in 3 months

## 2017-04-13 ENCOUNTER — Telehealth: Payer: Self-pay | Admitting: Internal Medicine

## 2017-04-13 NOTE — Telephone Encounter (Signed)
Lmtcb@3 :05pm Joellen Jersey

## 2017-04-13 NOTE — Telephone Encounter (Signed)
Patient's wife asked to please call her before 4:00 pm (even if it is tomorrow).

## 2017-04-13 NOTE — Telephone Encounter (Signed)
Spoke to wife they have decided to stay with [facility] so I sent order to [facility] to contact them the week of 05/06/17 pt is out of town this then [employer]    Also I need someone change in the [facility] notes in [facility] that [facility] is dme provider thanks Joellen Jersey

## 2017-04-14 NOTE — Telephone Encounter (Signed)
DME has been updated in the [facility] note.

## 2017-04-15 NOTE — Telephone Encounter (Signed)
Called and spoke with pts wife and she stated that they are aware of these results.  She stated that she called and spoke with [facility] and was advised that the pt would have to attend a 2 hour instructional class--she is confused about this since the pt has been on cpap for years.  She was advised that this class is required by law.  Called and lmomtcb x 1 for Melissa.

## 2017-04-17 NOTE — Telephone Encounter (Signed)
Spoke with Melissa and she stated that [facility] normally does the "class:" just so everyone can see the set up of their new machine and how to work it.  I advised Melissa what the wife said about the pt being in public settings and that he would not make it through the class.  Lenna Sciara is going to talk with the RT tech and see if they can get him scheduled for a one on one.  pts wife is aware.

## 2017-04-29 ENCOUNTER — Ambulatory Visit: Payer: Medicare Other | Admitting: Cardiovascular Disease

## 2017-05-05 ENCOUNTER — Ambulatory Visit (INDEPENDENT_AMBULATORY_CARE_PROVIDER_SITE_OTHER): Payer: Medicare Other | Admitting: Nurse Practitioner

## 2017-05-05 ENCOUNTER — Other Ambulatory Visit: Payer: Self-pay | Admitting: *Deleted

## 2017-05-05 ENCOUNTER — Encounter: Payer: Self-pay | Admitting: Nurse Practitioner

## 2017-05-05 VITALS — BP 150/80 | HR 60 | Ht 70.0 in | Wt 277.8 lb

## 2017-05-05 DIAGNOSIS — I712 Thoracic aortic aneurysm, without rupture: Secondary | ICD-10-CM | POA: Diagnosis not present

## 2017-05-05 DIAGNOSIS — I10 Essential (primary) hypertension: Secondary | ICD-10-CM

## 2017-05-05 DIAGNOSIS — Q231 Congenital insufficiency of aortic valve: Secondary | ICD-10-CM

## 2017-05-05 DIAGNOSIS — I480 Paroxysmal atrial fibrillation: Secondary | ICD-10-CM | POA: Diagnosis not present

## 2017-05-05 DIAGNOSIS — I7121 Aneurysm of the ascending aorta, without rupture: Secondary | ICD-10-CM

## 2017-05-05 MED ORDER — METOPROLOL SUCCINATE ER 100 MG PO TB24: 100.0000 mg | ORAL_TABLET | Freq: Every evening | ORAL | 2 refills | Status: DC

## 2017-05-05 MED ORDER — XARELTO 20 MG PO TABS: 20.0000 mg | ORAL_TABLET | Freq: Every evening | ORAL | 2 refills | Status: DC

## 2017-05-05 NOTE — Patient Instructions (Addendum)
We will be checking the following labs today - BMET, CBC, HPF, Lipids and TSH   Medication Instructions:    Continue with your current medicines.     Testing/Procedures To Be Arranged:  CT angio of the chest - follow up ascending aneurysm  Echocardiogram  Follow-Up:   See Dr. 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 You should avoid use of Cipro and other associated antibiotics (flouroquinolone antibiotics )  It is  best to avoid activities that cause grunting or straining (medically referred to as a "valsalva maneuver"). This happens when a person bears down against a closed throat to increase the strength of arm or abdominal muscles. There's often a tendency to do this when lifting heavy weights, doing sit-ups, push-ups or chin-ups, etc., but it may be harmful.     An aneurysm is a bulge in an artery. It happens when blood pushes up against a weakened or damaged artery wall. A thoracic aortic aneurysm is an aneurysm that occurs in the first part of the aorta, between the heart and the diaphragm. The aorta is the main artery of the body. It supplies blood from the heart to the rest of the body. Some aneurysms may not cause symptoms or problems. However, the major concern with a thoracic aortic aneurysm is that it can enlarge and burst (rupture), or blood can flow between the layers of the wall of the aorta through a tear (aorticdissection). Both of these conditions can cause bleeding inside the body and can be life-threatening if they are not diagnosed and treated right away. What are the causes? The exact cause of this condition is not known. What increases the  risk? The following factors may make you more likely to develop this condition:  Being age 42 or older.  Having a hardening of the arteries caused by the buildup of fat and other substances in the lining of a blood vessel (arteriosclerosis).  Having inflammation of the walls of an artery (arteritis).  Having a genetic disease that weakens the body's connective tissue, such as Marfan syndrome.  Having an injury or trauma to the aorta.  Having an infection that is caused by bacteria, such as syphilis or staphylococcus, in the wall of the aorta (infectious aortitis).  Having high blood pressure (hypertension).  Being male.  Being white (Caucasian).  Having high cholesterol.  Having a family history of aneurysms.  Using tobacco.  Having chronic obstructive pulmonary disease (COPD). What are the signs or symptoms? Symptoms of this condition vary depending on the size and rate of growth of the aneurysm. Most grow slowly and do not cause any symptoms. When symptoms do occur, they may include:  Pain in the chest, back, sides, or abdomen. The pain may vary in intensity. A sudden onset of severe pain may indicate that the aneurysm has ruptured.  Hoarseness.  Cough.  Shortness of breath.  Swallowing problems.  Swelling in the face, arms, or legs.  Fever.  Unexplained weight loss. How is this diagnosed? This condition may be diagnosed with:  An ultrasound.  X-rays.  A CT scan.  An MRI.  Tests to check the arteries for damage or blockages (angiogram). Most unruptured thoracic aortic aneurysms cause no symptoms, so they are often found during exams for other conditions. How is this treated? Treatment for this condition depends on:  The size of the aneurysm.  How fast the aneurysm is growing.  Your age.  Risk factors for rupture. Aneurysms that are smaller than 2.2 inches (5.5 cm) may be managed by using medicines to control blood pressure, manage pain, or fight  infection. You may need regular monitoring to see if the aneurysm is getting bigger. Your health care provider may recommend that you have an ultrasound every year or every 6 months. How often you need to have an ultrasound depends on the size of the aneurysm, how fast it is growing, and whether you have a family history of aneurysms. Surgical repair may be needed if your aneurysm is larger than 2.2 inches or if it is growing quickly. Follow these instructions at home: Eating and drinking   Eat a healthy diet. Your health care provider may recommend that you:  Lower your salt (sodium) intake. In some people, too much salt can raise blood pressure and increase the risk of thoracic aortic aneurysm.  Avoid foods that are high in saturated fat and cholesterol, such as red meat and dairy.  Eat a diet that is low in sugar.  Increase your fiber intake by including whole grains, vegetables, and fruits in your diet. Eating these foods may help to lower blood pressure.  Limit or avoid alcohol as recommended by your health care provider. Lifestyle   Follow instructions from your health care provider about healthy lifestyle habits. Your health care provider may recommend that you:  Do not use any products that contain nicotine or tobacco, such as cigarettes and e-cigarettes. If you need help quitting, ask your health care provider.  Keep your blood pressure within normal limits. The target limit for most people is below 120/80. Check your blood pressure regularly. If it is high, ask your health care provider about ways that you can control it.  Keep your blood sugar (glucose) level and cholesterol levels within normal limits. Target limits for most people are:  Blood glucose level: Less than 100 mg/dL.  Total cholesterol level: Less than 200 mg/dL.  Maintain a healthy weight. Activity   Stay physically active and exercise regularly. Talk with your health care provider about how often you should  exercise and ask which types of exercise are safe for you.  Avoid heavy lifting and activities that take a lot of effort (are strenuous). Ask your health care provider what activities are safe for you. General instructions   Keep all follow-up visits as told by your health care provider. This is important.  Talk with your health care provider about regular screenings to see if the aneurysm is getting bigger.  Take over-the-counter and prescription medicines only as told by your health care provider. Contact a health care provider if:  You have discomfort in your upper back, neck, or abdomen.  You have trouble swallowing.  You have a cough or hoarseness.  You have a family history of aneurysms.  You have unexplained weight loss. Get help right away if:  You have sudden, severe pain in your upper back and abdomen. This pain may move into your chest and arms.  You have shortness of breath.  You have a fever. This information is not intended to replace advice  given to you by your health care provider. Make sure you discuss any questions you have with your health care provider. Document Released: 10/06/2005 Document Revised: 07/18/2016 Document Reviewed: 07/18/2016 Elsevier Interactive Patient Education  2017 [employer].   Aortic Dissection An aortic dissection happens when there is a tear in the main blood vessel of the body (aorta). The aorta comes out of the heart, curves around, and then goes down the chest (thoracic aorta) and into the abdomen (abdominal aorta) to supply arteries with blood. The wall of the aorta has inner and outer layers. Aortic dissection occurs most often in the thoracic aorta. As the tear widens and blood flows through it, the aorta becomes "double-barreled." This means that one part of the aorta continues to carry blood to the body, but blood also flows into the tear, between the layers of the aorta. The torn part of the aorta fills with blood and swells  up. This can reduce blood flow through the part of the aorta that is still supplying blood to the body. Aortic dissection is a medical emergency. What are the causes? An aortic dissection is commonly caused by weakening of the artery wall due to high blood pressure. Other causes may include:  An injury, such as from a car crash.  Birth defects that affect the heart (congenital heart defects).  Thickening of the artery walls. In some cases, the cause is not known. What increases the risk? The following factors may make you more likely to develop this condition:  Having certain medical conditions, such as:  High blood pressure (hypertension).  Hardening and narrowing of the arteries (atherosclerosis).  A genetic disorder that affects the connective tissue, such as Marfan syndrome or Ehlers-Danlos syndrome.  A condition that causes inflammation of blood vessels, such as giant cell arteritis.  Having a chest injury.  Having surgery on the aorta.  Being born with a congenital heart defect.  Being male.  Being older than age 55.  Using cocaine.  Smoking.  Lifting heavy weights or doing other types of high-intensity resistance training. What are the signs or symptoms? Signs and symptoms of aortic dissection start suddenly. The most common symptoms are:  Severe chest pain that may feel like tearing, stabbing, or sharp pain.  Severe pain that spreads (radiates) to the back, neck, jaw, or abdomen. Other symptoms may include:  Trouble breathing.  Dizziness or fainting.  Sudden weakness on one side of the body.  Nausea or vomiting.  Trouble swallowing.  Coughing up blood.  Vomiting blood.  Clammy skin. How is this diagnosed? This condition may be diagnosed based on:  Your symptoms.  A physical exam. This may include:  Listening for abnormal blood flow sounds (murmurs) in your chest or abdomen.  Checking your pulse in your arms and legs.  Checking your blood  pressure to see whether it is low or whether there is a difference between the measurements in your right and left arm.  Electrocardiogram (ECG). This test measures the electrical activity in your heart.  Chest X-ray.  CT scan.  MRI.  Aortic angiogram. This test involves injecting dye to make it easier to see your blood vessels clearly.  Echocardiogram to study your heart using sound waves.  Blood tests. How is this treated? It is important to treat an aortic dissection as quickly as possible. Treatment may start as soon as your health care provider thinks that you have aortic dissection. Treatment depends on the location and severity of your dissection and  your overall health. Treatment may include:  Medicines to lower your blood pressure.  Surgery to repair the dissected part of your aorta with artificial material (syntheticgraft).  A medical procedure to insert a stent-graft into the aorta (endovascular procedure). During this procedure, a long, thin tube (stent) is inserted into an artery near the groin (femoral artery) and moved up to the damaged part of the aorta. Then, the stent is opened to help improve blood flow and prevent future dissection. Follow these instructions at home: Activity   Avoid activities that could injure your chest or your abdomen. Ask your health care provider what activities are safe for you.  After you have recovered, try to stay active. Ask your health care provider what activities are safe for you after recovery.  Do not lift anything that is heavier than 10 lb (4.5 kg) until your health care provider approves.  Do not drive or use heavy machinery while taking prescription pain medicine. Eating and drinking   Eat a heart-healthy diet, which includes lots of fresh fruits and vegetables, low-fat (lean) protein, and whole grains.  Check ingredients and nutrition facts on packaged foods and beverages, and avoid foods with high amounts of:  Salt  (sodium).  Saturated fats (like red meat).  Trans fats (like fried food). General instructions   Take over-the-counter and prescription medicines only as told by your health care provider.  Work with your health care provider to manage your blood pressure.  Talk with your health care provider about how to manage stress.  Do not use any products that contain nicotine or tobacco, such as cigarettes and e-cigarettes. If you need help quitting, ask your health care provider.  Keep all follow-up visits as told by your health care provider. This is important. Get help right away if:  You develop any symptoms of aortic dissection after treatment, including severe pain in your chest, back, or abdomen.  You have a pain in your abdomen.  You have trouble breathing or you develop a cough.  You faint.  You develop a racing heartbeat. These symptoms may represent a serious problem that is an emergency. Do not wait to see if the symptoms will go away. Get medical help right away. Call your local emergency services (911 in the [place] Do not drive yourself to the hospital. Summary  An aortic dissection happens when there is a tear in the main blood vessel of the body (aorta). It is a medical emergency.  The most common symptom is severe pain in the chest that spreads (radiates) to the back, neck, jaw, or abdomen.  It is important to treat an aortic dissection as quickly as possible. Treatment typically includes surgery and medicines. This information is not intended to replace advice given to you by your health care provider. Make sure you discuss any questions you have with your health care provider. Document Released: 01/13/2008 Document Revised: 08/25/2016 Document Reviewed: 08/25/2016 Elsevier Interactive Patient Education  2017 [employer].     If you need a refill on your cardiac medications before your next appointment, please call your pharmacy.   Call the [place] office at [phone] if you have any questions, problems or concerns.

## 2017-05-05 NOTE — Progress Notes (Signed)
CARDIOLOGY OFFICE NOTE  Date:  05/05/2017    Grant [name] Date of Birth: 11/16/1950 Medical Record #[id number]  PCP:  Shirline Frees, MD  Cardiologist:  [name]  Chief Complaint  Patient presents with  . Atrial Fibrillation    Follow up visit - seen for Dr. [name]    History of Present Illness: Grant [name] is a 66 y.o. male who presents today for a 6 month check. Seen for Dr. [name].   He has a history of PAF - noted back in 2014 - echo with normal EF - placed on Xarelto. Other issues include history of toxic encephalopathy, bicuspid aortic valve, dilated aortic root, chronic fatigue, fibromyalgia, migraines, GERD, OSA, and HTN.   Last seen back in January. Cardiac status felt to be stable.   Comes in today. Here with his wife. In sunglasses and a hat in the exam room. She seems to provide most of the history. Does not really talk or provide much history on the part of the patient.  He says everything "the same". Does not really wish to elaborate on symptoms. Lots of issus with chronic fatigue. Just returned from the beach - probably too much salt and too much alcohol. "Tries to" drink every day and apparently does drink every day. No syncope. Not dizzy or lightheaded. His answer is "the same" to just about everything I ask. BP has been better at other offices but not really checking at home. No regular exercise. She notes that he had a spell last month while at the beach where she felt he got overheated - but did not seek medical attention.   Past Medical History:  Diagnosis Date  . Allergic rhinitis   . Anxiety   . Bladder tumor   . Chronic fatigue   . Chronic fatigue   . Depression   . Fibromyalgia   . GERD (gastroesophageal reflux disease)   . History of chronic bronchitis   . History of migraine   . History of toxic encephalopathy   . Hypothyroidism   . OSA on CPAP    CPAP 14  . PAF (paroxysmal atrial fibrillation) ([facility]) CARDIOLOGIST -- DR [name]   DX  OCT 2013  . 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 Unspecified essential hypertension   . Urethral tumor    PROSTATIC    Past Surgical History:  Procedure Laterality Date  . CYSTOSCOPY W/ RETROGRADES Bilateral 06/15/2013   Procedure: CYSTOSCOPY WITH BILATERAL RETROGRADE PYELOGRAM  BLADDER BIOPSY, PROSTATIC URETHRAL BIOPSY, ;  Surgeon: Molli Hazard, MD;  Location: [facility];  Service: Urology;  Laterality: Bilateral;  . LAPAROSCOPIC CHOLECYSTECTOMY  01-14-2001  . UMBILICAL HERNIA REPAIR  01-14-2008     Medications: Current Meds  Medication Sig  . albuterol (VENTOLIN HFA) 108 (90 Base) MCG/ACT inhaler INHALE 2 PUFFS INTO LUNGS EVERY 6 HOURS AS NEEDED FOR WHEEZING OR SHORTNESS OF BREATH  . Ascorbic Acid (VITAMIN C) 1000 MG tablet Take 1,000 mg by mouth daily with breakfast.  . azelastine (ASTELIN) 137 MCG/SPRAY nasal spray Place 1-2 sprays into the nose 2 (two) times daily as needed for rhinitis or allergies.   . benzonatate (TESSALON) 200 MG capsule Take 1 capsule (200 mg total) by mouth every 6 (six) hours as needed for cough. (Patient taking differently: Take 200 mg by mouth daily with breakfast. )  . budesonide-formoterol (SYMBICORT) 160-4.5 MCG/ACT inhaler INHALE 2 PUFF INTO THE LUNGS 2 TIMES A DAY.  Marland Kitchen Cholecalciferol (VITAMIN D-3) 5000 UNITS  TABS Take 5,000 Units by mouth daily with breakfast.   . COLCRYS 0.6 MG tablet Take 0.6 mg by mouth daily with breakfast.   . dextromethorphan (DELSYM) 30 MG/5ML liquid Take 30 mg by mouth at bedtime as needed for cough.   Marland Kitchen DHEA 50 MG TABS Take 1 tablet by mouth daily with breakfast.   . fexofenadine (ALLEGRA) 180 MG tablet Take 180 mg by mouth daily with breakfast.   . finasteride (PROSCAR) 5 MG tablet Take 5 mg by mouth at bedtime.   . Hypromellose (ALZAIR ALLERGY NASAL SPRAY NA) Place 1 spray into both nostrils daily as needed (for allergie).  Marland Kitchen levothyroxine (SYNTHROID, LEVOTHROID) 112 MCG tablet Take 112 mcg by mouth daily  before breakfast.   . liothyronine (CYTOMEL) 25 MCG tablet Take 25 mcg by mouth every morning.   Marland Kitchen losartan (COZAAR) 100 MG tablet Take 1 tablet (100 mg total) by mouth daily. (Patient taking differently: Take 100 mg by mouth daily with breakfast. )  . metoprolol succinate (TOPROL-XL) 100 MG 24 hr tablet Take 1 tablet (100 mg total) by mouth every evening. Take with or immediately following a meal.  . Multiple Vitamins-Minerals (ONE-A-DAY MENS HEALTH FORMULA PO) Take 1 tablet by mouth daily with breakfast.   . naproxen sodium (ANAPROX) 220 MG tablet Take 220 mg by mouth every 12 (twelve) hours as needed (for pain).  Marland Kitchen neomycin-polymyxin-hydrocortisone (CORTISPORIN) 3.5-10000-1 otic suspension Place 4 drops in ear(s) 3 (three) times daily as needed (for infected ears).   Marland Kitchen omeprazole (PRILOSEC) 40 MG capsule Take 1 capsule (40 mg total) by mouth 2 (two) times daily.  Marland Kitchen perphenazine (TRILAFON) 2 MG tablet Take 2 mg by mouth 2 (two) times daily as needed (for migraines).   . Probiotic Product (ALIGN) 4 MG CAPS Take 1 capsule by mouth daily with breakfast.   . tamsulosin (FLOMAX) 0.4 MG CAPS capsule Take 0.4 mg by mouth daily after supper.  . testosterone cypionate (DEPOTESTOTERONE CYPIONATE) 100 MG/ML injection Inject 200 mg into the muscle every 7 (seven) days. For IM use only  . venlafaxine (EFFEXOR-XR) 150 MG 24 hr capsule Take 150 mg by mouth every morning.   . vitamin B-12 (CYANOCOBALAMIN) 1000 MCG tablet Take 1,000 mcg by mouth daily with breakfast.   . XARELTO 20 MG TABS tablet Take 1 tablet (20 mg total) by mouth every evening.     Allergies: Allergies  Allergen Reactions  . Codeine Itching    Social History: The patient  reports that he quit smoking about 37 years ago. His smoking use included Cigarettes. He has a 13.50 pack-year smoking history. He has never used smokeless tobacco. He reports that he drinks about 12.6 oz of alcohol per week . He reports that he does not use drugs.     Family History: The patient's family history includes Aortic aneurysm (age of onset: 56) in his mother; Heart disease in his unknown relative; Other in his father.   Review of Systems: Please see the history of present illness.   Otherwise, the review of systems is positive for none.   All other systems are reviewed and negative.   Physical Exam: VS:  BP (!) 150/80 (BP Location: Left Arm, Patient Position: Sitting, Cuff Size: Large)   Pulse 60   Ht 5\' 10"  (1.778 m)   Wt 277 lb 12.8 oz (126 kg)   BMI 39.86 kg/m  .  BMI Body mass index is 39.86 kg/m.  Wt Readings from Last 3 Encounters:  05/05/17 277  lb 12.8 oz (126 kg)  01/19/17 276 lb 9.6 oz (125.5 kg)  01/14/17 270 lb (122.5 kg)    General: Obese. Alert and in no acute distress. Pretty flat affect.   HEENT: Normal.  Neck: Supple, no JVD, carotid bruits, or masses noted.  Cardiac: Regular rate and rhythm. No murmurs, rubs, or gallops. No edema.  Respiratory:  Lungs are clear to auscultation bilaterally with normal work of breathing.  GI: Soft and nontender.  MS: No deformity or atrophy. Gait and ROM intact.  Skin: Warm and dry. Color is normal.  Neuro:  Strength and sensation are intact and no gross focal deficits noted.  Psych: Alert, appropriate and with normal affect.   LABORATORY DATA:  EKG:  EKG is not ordered today.   Lab Results  Component Value Date   WBC 9.5 01/14/2017   HGB 15.3 01/14/2017   HCT 45.0 01/14/2017   PLT 189 01/14/2017   GLUCOSE 101 (H) 01/14/2017   ALT 46 01/14/2017   AST 85 (H) 01/14/2017   NA 139 01/14/2017   K 3.8 01/14/2017   CL 99 (L) 01/14/2017   CREATININE 1.00 01/14/2017   BUN 11 01/14/2017   CO2 28 01/14/2017     BNP (last 3 results) No results for input(s): BNP in the last 8760 hours.  ProBNP (last 3 results) No results for input(s): PROBNP in the last 8760 hours.   Other Studies Reviewed Today:  Echo Study Conclusions 12/2015  - Left ventricle: The cavity size was  normal. There was moderate   concentric hypertrophy. Systolic function was normal. The   estimated ejection fraction was in the range of 55% to 60%. Wall   motion was normal; there were no regional wall motion   abnormalities. Features are consistent with a pseudonormal left   ventricular filling pattern, with concomitant abnormal relaxation   and increased filling pressure (grade 2 diastolic dysfunction). - Aortic valve: Bicuspid; normal thickness, mildly calcified   leaflets. There was moderate regurgitation. - Aorta: Aortic root dimension: 47 mm (ED). Ascending aortic   diameter: 52 mm (S). - Aortic root: The aortic root was moderately dilated. - Ascending aorta: The ascending aorta was severely dilated. - Mitral valve: Calcified annulus.  Impressions:  - Patient will have f/u cardiac CTA to further assess aortic root.   Normal LVF with EF 55-60%, moderate LVH, mild to moderate AI,   moderately dilated aortic root at sinus of valsalva measuring   4.7cm and severely dilated ascending aorta measuring 5.2cm.    CT CORONARY IMPRESSION 08/2016: 1.  Coronary artery calcium score of 0 Agatston units.  2.  Coronary arteries showed no significant plaque or stenosis.  3. Ascending aortic aneurysm as reported on [facility] Radiology addendum.  4.  Bicuspid aortic valve.  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 Recommend semi-annual imaging followup by CTA or MRA and referral to cardiothoracic surgery if not already obtained. This recommendation follows 2010  Dalton Mclean  Electronically Signed   By: Loralie Champagne M.D.   On: 09/07/2016 10:46   Myoview Study Highlights 08/2015   Nuclear stress EF: 51%.  The left ventricular ejection fraction is mildly decreased (45-54%).  There was no ST segment deviation noted during stress.  No T wave inversion was noted during stress.  The study is normal.  This is a  low risk study.   Low risk stress nuclear study with normal perfusion and mildly reduced left ventricular global systolic  function. Suspect nonischemic cardiomyopathy.     ASSESSMENT AND PLAN:  1.  Chest pressure episodically with previous ca+ score of 0 and normal Cors on past  Cardiac CTA aorta - needs CV risk factor modification.   2.  Aortic Aneurysm . Needs repeat study - referring to [facility]. His mother died with a dissection. Needs BP control, avoid valsalva and avoid fluoroquinolones as well.   3. HTN needs improved control, wife stated that it is up and down. I have asked them to monitor. He is not interested in further medicines at this time.   4. Bicuspid AV- last echo with stable moderate AR - needs updating.   5. PAF on xarelto maintaining SR. Needs follow up lab today  6. Has small particle lipids, LDL of 128 - he has not wanted statin therapy  7. Alcohol abuse - elevated LFTs - rechecking today - advised to cut back - not clear to me if he can.   Overall, he seems to have poor insight into his medical issues.    Current medicines are reviewed with the patient today.  The patient does not have concerns regarding medicines other than what has been noted above.  The following changes have been made:  See above.  Labs/ tests ordered today include:    Orders Placed This Encounter  Procedures  . CT ANGIO CHEST AORTA W &/OR WO CONTRAST  . Basic metabolic panel  . CBC  . Hepatic function panel  . Lipid panel  . TSH  . Ambulatory referral to Cardiothoracic Surgery  . ECHOCARDIOGRAM COMPLETE     Disposition:   FU with Dr. [name] in 6 months.   Patient is agreeable to this plan and will call if any problems develop in the interim.   SignedTruitt Merle, NP  05/05/2017 9:15 AM  [facility] [address] [place], [place]  [address] Phone: [phone] Fax: [phone]

## 2017-05-06 DIAGNOSIS — R310 Gross hematuria: Secondary | ICD-10-CM | POA: Diagnosis not present

## 2017-05-06 DIAGNOSIS — N400 Enlarged prostate without lower urinary tract symptoms: Secondary | ICD-10-CM | POA: Diagnosis not present

## 2017-05-06 LAB — BASIC METABOLIC PANEL
BUN/Creatinine Ratio: 14 (ref 10–24)
BUN: 13 mg/dL (ref 8–27)
CO2: 19 mmol/L — ABNORMAL LOW (ref 20–29)
Calcium: 8.8 mg/dL (ref 8.6–10.2)
Chloride: 101 mmol/L (ref 96–106)
Creatinine, Ser: 0.94 mg/dL (ref 0.76–1.27)
GFR calc Af Amer: 99 mL/min/{1.73_m2} (ref >59)
GFR calc non Af Amer: 86 mL/min/{1.73_m2} (ref >59)
Glucose: 102 mg/dL — ABNORMAL HIGH (ref 65–99)
Potassium: 4.5 mmol/L (ref 3.5–5.2)
Sodium: 142 mmol/L (ref 134–144)

## 2017-05-06 LAB — HEPATIC FUNCTION PANEL
ALT: 26 IU/L (ref 0–44)
AST: 22 IU/L (ref 0–40)
Albumin: 4.2 g/dL (ref 3.6–4.8)
Alkaline Phosphatase: 60 IU/L (ref 39–117)
Bilirubin Total: 0.3 mg/dL (ref 0.0–1.2)
Bilirubin, Direct: 0.1 mg/dL (ref 0.00–0.40)
Total Protein: 6.1 g/dL (ref 6.0–8.5)

## 2017-05-06 LAB — CBC
Hematocrit: 43.4 % (ref 37.5–51.0)
Hemoglobin: 15.1 g/dL (ref 13.0–17.7)
MCH: 31.8 pg (ref 26.6–33.0)
MCHC: 34.8 g/dL (ref 31.5–35.7)
MCV: 91 fL (ref 79–97)
Platelets: 187 10*3/uL (ref 150–379)
RBC: 4.75 x10E6/uL (ref 4.14–5.80)
RDW: 14.3 % (ref 12.3–15.4)
WBC: 7.2 10*3/uL (ref 3.4–10.8)

## 2017-05-06 LAB — TSH: TSH: 1.67 u[IU]/mL (ref 0.450–4.500)

## 2017-05-06 LAB — LIPID PANEL
Chol/HDL Ratio: 4.1 ratio (ref 0.0–5.0)
Cholesterol, Total: 184 mg/dL (ref 100–199)
HDL: 45 mg/dL (ref >39)
LDL Calculated: 94 mg/dL (ref 0–99)
Triglycerides: 225 mg/dL — ABNORMAL HIGH (ref 0–149)
VLDL Cholesterol Cal: 45 mg/dL — ABNORMAL HIGH (ref 5–40)

## 2017-05-12 ENCOUNTER — Ambulatory Visit (INDEPENDENT_AMBULATORY_CARE_PROVIDER_SITE_OTHER)
Admission: RE | Admit: 2017-05-12 | Discharge: 2017-05-12 | Disposition: A | Discharge status: Home / Self Care | Payer: Medicare Other | Source: Ambulatory Visit | Attending: Nurse Practitioner | Admitting: Nurse Practitioner

## 2017-05-12 ENCOUNTER — Telehealth: Payer: Self-pay | Admitting: *Deleted

## 2017-05-12 DIAGNOSIS — I712 Thoracic aortic aneurysm, without rupture: Secondary | ICD-10-CM | POA: Diagnosis not present

## 2017-05-12 DIAGNOSIS — I480 Paroxysmal atrial fibrillation: Secondary | ICD-10-CM | POA: Diagnosis not present

## 2017-05-12 DIAGNOSIS — I10 Essential (primary) hypertension: Secondary | ICD-10-CM

## 2017-05-12 DIAGNOSIS — I7121 Aneurysm of the ascending aorta, without rupture: Secondary | ICD-10-CM

## 2017-05-12 DIAGNOSIS — Q2381 Bicuspid aortic valve: Secondary | ICD-10-CM

## 2017-05-12 DIAGNOSIS — Q231 Congenital insufficiency of aortic valve: Secondary | ICD-10-CM | POA: Diagnosis not present

## 2017-05-12 DIAGNOSIS — I729 Aneurysm of unspecified site: Secondary | ICD-10-CM | POA: Diagnosis not present

## 2017-05-12 MED ORDER — IOPAMIDOL (ISOVUE-370) INJECTION 76%
100.0000 mL | Freq: Once | INTRAVENOUS | Status: AC | PRN
  Administered 2017-05-12: 100 mL via INTRAVENOUS

## 2017-05-12 NOTE — Telephone Encounter (Signed)
See where pt has given verbal permission ok to s/w his wife. I s/w pt's wife about CT results and findings by phone with verbal understanding. [name] states they also have a POA that should be on file, though I was not able to locate. Wife states she will bring another copy for our office. I advised her to ask for Kim in HIM Dept to have POA scanned in. Pt and his wife thanked me for the call and are aware to keep the upcoming appt with Dr. Prescott Gum.

## 2017-05-12 NOTE — Telephone Encounter (Signed)
-----   Message from Burtis Junes, NP sent at 05/12/2017  5:45 PM EDT ----- Agree - echo later this week- needs to keep his follow up at [facility] as planned.

## 2017-05-14 ENCOUNTER — Other Ambulatory Visit: Payer: Self-pay

## 2017-05-14 ENCOUNTER — Telehealth: Payer: Self-pay | Admitting: Nurse Practitioner

## 2017-05-14 ENCOUNTER — Ambulatory Visit (HOSPITAL_COMMUNITY): Payer: Medicare Other | Attending: Cardiology

## 2017-05-14 DIAGNOSIS — Q231 Congenital insufficiency of aortic valve: Secondary | ICD-10-CM | POA: Insufficient documentation

## 2017-05-14 DIAGNOSIS — I10 Essential (primary) hypertension: Secondary | ICD-10-CM | POA: Diagnosis not present

## 2017-05-14 DIAGNOSIS — I480 Paroxysmal atrial fibrillation: Secondary | ICD-10-CM | POA: Diagnosis not present

## 2017-05-14 DIAGNOSIS — Z87891 Personal history of nicotine dependence: Secondary | ICD-10-CM | POA: Diagnosis not present

## 2017-05-14 DIAGNOSIS — I712 Thoracic aortic aneurysm, without rupture: Secondary | ICD-10-CM | POA: Diagnosis not present

## 2017-05-14 DIAGNOSIS — Q2381 Bicuspid aortic valve: Secondary | ICD-10-CM

## 2017-05-14 DIAGNOSIS — I7121 Aneurysm of the ascending aorta, without rupture: Secondary | ICD-10-CM

## 2017-05-14 NOTE — Telephone Encounter (Signed)
F/u message  Pt wife returning Rn call. Please call back to discuss

## 2017-05-19 DIAGNOSIS — G4733 Obstructive sleep apnea (adult) (pediatric): Secondary | ICD-10-CM | POA: Diagnosis not present

## 2017-05-21 DIAGNOSIS — G4733 Obstructive sleep apnea (adult) (pediatric): Secondary | ICD-10-CM | POA: Diagnosis not present

## 2017-05-22 DIAGNOSIS — E279 Disorder of adrenal gland, unspecified: Secondary | ICD-10-CM | POA: Diagnosis not present

## 2017-05-22 DIAGNOSIS — E039 Hypothyroidism, unspecified: Secondary | ICD-10-CM | POA: Diagnosis not present

## 2017-05-22 DIAGNOSIS — N400 Enlarged prostate without lower urinary tract symptoms: Secondary | ICD-10-CM | POA: Diagnosis not present

## 2017-05-22 DIAGNOSIS — E119 Type 2 diabetes mellitus without complications: Secondary | ICD-10-CM | POA: Diagnosis not present

## 2017-05-27 ENCOUNTER — Other Ambulatory Visit: Payer: Self-pay | Admitting: *Deleted

## 2017-05-27 ENCOUNTER — Institutional Professional Consult (permissible substitution) (INDEPENDENT_AMBULATORY_CARE_PROVIDER_SITE_OTHER): Payer: Medicare Other | Admitting: Cardiothoracic Surgery

## 2017-05-27 ENCOUNTER — Encounter: Payer: Self-pay | Admitting: Cardiothoracic Surgery

## 2017-05-27 VITALS — BP 148/86 | HR 65 | Resp 16 | Ht 70.0 in | Wt 282.0 lb

## 2017-05-27 DIAGNOSIS — Q231 Congenital insufficiency of aortic valve: Secondary | ICD-10-CM

## 2017-05-27 DIAGNOSIS — Z01818 Encounter for other preprocedural examination: Secondary | ICD-10-CM

## 2017-05-27 DIAGNOSIS — I712 Thoracic aortic aneurysm, without rupture, unspecified: Secondary | ICD-10-CM

## 2017-05-27 DIAGNOSIS — I480 Paroxysmal atrial fibrillation: Secondary | ICD-10-CM

## 2017-05-27 DIAGNOSIS — I7121 Aneurysm of the ascending aorta, without rupture: Secondary | ICD-10-CM

## 2017-05-27 NOTE — Progress Notes (Signed)
PCP is [name], [name], MD Referring Provider is Nishan, Perfecto Purdy C, MD  Chief Complaint  Patient presents with  . Thoracic Aortic Aneurysm    Surgical eval, CTA Chest 05/12/17, ECHO 05/14/17...BICUSPID AORTIC [name] with MODERATE REGURG  Patient examined, CTA of chest, cardiac CT scan, echocardiogram images all personally reviewed and counseled with patient and wife  HPI: Grant [name] with slowly enlarging aortic root and ascending aortic fusiform aneurysm. In 2016 CTA measurement showed the aortic root at 4.9 cm. Recently his echo and CTA showed the ascending aorta at 5.4 cm. The patient has hypertension and is fairly compliant with his medications. He stopped smoking 30 years ago but has COPD-bronchitis His mother died from aortic dissection with a known fusiform ascending aneurysm. The patient states he has had some vague midsternal discomfort for the past 4 weeks which is present continuously.  Echocardiogram shows a probable bicuspid aortic [name] with moderate aortic insufficiency but no LV dilatation. The patient has no symptoms of CHF.  The patient recently had a cardiac CTA and stress test-Myoview. This showed ejection fraction of 50%. Negative for ischemia. The cardiac CTA images are not clear enough to rule out significant CAD pre-op aortic root replacement. The patient will need a left and right heart cath prior to elective aortic root replacement. He has significant sleep apnea.  Patient has a pulmonologist - Dr. Young and recently had spirometry but not full PFTs. The patient has sleep apnea and was treated recently for bronchitis with a Z-Pak.  Patient has no active dental complaints and has his teeth cleaned every 6 months. He will need pre-dental evaluation and clearance prior to elective aVR and root replacement  The patient has history of transient atrial fibrillation while  at the beach 3 years ago. He has been on xarelto  and a beta blocker since then without recurrent A. fib. He did not require cardioversion.  Past Medical History:  Diagnosis Date  . Allergic rhinitis   . Anxiety   . Bladder tumor   . Chronic fatigue   . Chronic fatigue   . Depression   . Fibromyalgia   . GERD (gastroesophageal reflux disease)   . History of chronic bronchitis   . History of migraine   . History of toxic encephalopathy   . Hypothyroidism   . OSA on CPAP    CPAP 14  . PAF (paroxysmal atrial fibrillation) ([facility]) CARDIOLOGIST -- DR NISHAN   DX OCT 2013  . 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 Unspecified essential hypertension   . Urethral tumor    PROSTATIC    Past Surgical History:  Procedure Laterality Date  . CYSTOSCOPY W/ RETROGRADES Bilateral 06/15/2013   Procedure: CYSTOSCOPY WITH BILATERAL RETROGRADE PYELOGRAM  BLADDER BIOPSY, PROSTATIC URETHRAL BIOPSY, ;  Surgeon: Daniel Young Woodruff, MD;  Location: [facility];  Service: Urology;  Laterality: Bilateral;  . LAPAROSCOPIC CHOLECYSTECTOMY  01-14-2001  . UMBILICAL HERNIA REPAIR  01-14-2008    Family History  Problem Relation Age of Onset  . Aortic aneurysm Mother 72       cause of death  . Other Father        motor vehicle accident  . Heart disease Unknown        family history    Social History Social History  Substance Use Topics  . Smoking status: Former Smoker    Packs/day: 0.50    Years: 27.00      Types: Cigarettes    Quit date: 10/21/1979  . Smokeless tobacco: Never Used  . Alcohol use 12.6 oz/week    21 Cans of beer per week    Current Outpatient Prescriptions  Medication Sig Dispense Refill  . albuterol (VENTOLIN HFA) 108 (90 Base) MCG/ACT inhaler INHALE 2 PUFFS INTO LUNGS EVERY 6 HOURS AS NEEDED FOR WHEEZING OR SHORTNESS OF BREATH 18 g 12  . azelastine (ASTELIN) 137 MCG/SPRAY nasal spray Place 1-2 sprays into the nose 2 (two) times daily as needed for rhinitis or allergies.      . benzonatate (TESSALON) 200 MG capsule Take 1 capsule (200 mg total) by mouth every 6 (six) hours as needed for cough. (Patient taking differently: Take 200 mg by mouth daily with breakfast. ) 30 capsule 0  . budesonide-formoterol (SYMBICORT) 160-4.5 MCG/ACT inhaler INHALE 2 PUFF INTO THE LUNGS 2 TIMES A DAY. 10.2 g 12  . Cholecalciferol (VITAMIN D-3) 5000 UNITS TABS Take 5,000 Units by mouth daily with breakfast.     . COLCRYS 0.6 MG tablet Take 0.6 mg by mouth daily with breakfast.     . dextromethorphan (DELSYM) 30 MG/5ML liquid Take 30 mg by mouth at bedtime as needed for cough.     . DHEA 50 MG TABS Take 1 tablet by mouth daily with breakfast.     . fexofenadine (ALLEGRA) 180 MG tablet Take 180 mg by mouth daily with breakfast.     . finasteride (PROSCAR) 5 MG tablet Take 5 mg by mouth at bedtime.     . Hypromellose (ALZAIR ALLERGY NASAL SPRAY NA) Place 1 spray into both nostrils daily as needed (for allergie).    . levothyroxine (SYNTHROID, LEVOTHROID) 112 MCG tablet Take 112 mcg by mouth daily before breakfast.     . liothyronine (CYTOMEL) 25 MCG tablet Take 25 mcg by mouth every morning.     . losartan (COZAAR) 100 MG tablet Take 1 tablet (100 mg total) by mouth daily. (Patient taking differently: Take 100 mg by mouth daily with breakfast. ) 90 tablet 1  . metoprolol succinate (TOPROL-XL) 100 MG 24 hr tablet Take 1 tablet (100 mg total) by mouth every evening. Take with or immediately following a meal. 90 tablet 2  . Multiple Vitamins-Minerals (ONE-A-DAY MENS HEALTH FORMULA PO) Take 1 tablet by mouth daily with breakfast.     . naproxen sodium (ANAPROX) 220 MG tablet Take 220 mg by mouth every 12 (twelve) hours as needed (for pain).    . neomycin-polymyxin-hydrocortisone (CORTISPORIN) 3.5-10000-1 otic suspension Place 4 drops in ear(s) 3 (three) times daily as needed (for infected ears).     . omeprazole (PRILOSEC) 40 MG capsule Take 1 capsule (40 mg total) by mouth 2 (two) times daily.  180 capsule 1  . perphenazine (TRILAFON) 2 MG tablet Take 2 mg by mouth 2 (two) times daily as needed (for migraines).     . Probiotic Product (ALIGN) 4 MG CAPS Take 1 capsule by mouth daily with breakfast.     . tamsulosin (FLOMAX) 0.4 MG CAPS capsule Take 0.4 mg by mouth daily after supper.    . testosterone cypionate (DEPOTESTOTERONE CYPIONATE) 100 MG/ML injection Inject 200 mg into the muscle every 7 (seven) days. For IM use only    . venlafaxine (EFFEXOR-XR) 150 MG 24 hr capsule Take 150 mg by mouth every morning.     . vitamin B-12 (CYANOCOBALAMIN) 1000 MCG tablet Take 1,000 mcg by mouth daily with breakfast.     . XARELTO 20 MG   TABS tablet Take 1 tablet (20 mg total) by mouth every evening. 90 tablet 2   No current facility-administered medications for this visit.     Allergies  Allergen Reactions  . Codeine Itching    Review of Systems         Review of Systems :  [ y ] = yes, [  ] = no        General :  Weight gain [   ]    Weight loss  [   ]  Fatigue [yes  ]  Fever no[  ]  Chills  [  ]                                Weakness  [  ]           HEENT    Headache [  ]  Dizziness [  ]  Blurred vision [ light sensitivity ] Glaucoma  [  ]                          Nosebleeds [  ] Painful or loose teeth [  ]        Cardiac :  Chest pain/ pressure [ yes ]  Resting SOB [  ] exertional SOB [  ]                        Orthopnea [  ]  Pedal edema  [  ]  Palpitations [  ] Syncope/presyncope [ ]                        Paroxysmal nocturnal dyspnea [  ]         Pulmonary : cough [  ]  wheezing [  ]  Hemoptysis [  ] Sputum [  ] Snoring [  ]                              Pneumothorax [  ]  Sleep apnea [  ]        GI : Vomiting [  ]  Dysphagia [  ]  Melena  [  ]  Abdominal pain [  ] BRBPR [  ]              Heart burn [  ]  Constipation [  ] Diarrhea  [  ] Colonoscopy [   ]        GU : Hematuria [  ]  Dysuria [  ]  Nocturia [  ] UTI's [  ]        Vascular : Claudication [  ]  Rest pain [  ]   DVT [  ] Vein stripping [  ] leg ulcers [  ]                          TIA [  ] Stroke [  ]  Varicose veins [  ]        NEURO :  Headaches  [ yes ] Seizures [  ] Vision changes [  ] Paresthesias [  ]                                         Seizures [  ]        Musculoskeletal :  Arthritis [  ] Gout  [  ]  Back pain [  ]  Joint pain [  ]        Skin :  Rash [  ]  Melanoma [  ] Sores [  ]        Heme : Bleeding problems [  ]Clotting Disorders [  ] Anemia [  ]Blood Transfusion [ ]        Endocrine : Diabetes [  ] Heat or Cold intolerance [  ] Polyuria [  ]excessive thirst [ ]        Psych : Depression [  ]  Anxiety [  ]  Psych hospitalizations [  ] Memory change [  ]      The patient is status post cholecystectomy and repair of a local hernia under general anesthesia at [facility] without complication.  No history of thoracic trauma rib fracture or pneumothorax. No bleeding difficulties well on xarelto                                          BP (!) 148/86 (BP Location: Left Arm, Patient Position: Sitting, Cuff Size: Large)   Pulse 65   Resp 16   Ht 5' 10" (1.778 m)   Wt 282 lb (127.9 kg)   SpO2 95% Comment: ON RA  BMI 40.46 kg/m  Physical Exam     Physical Exam  General: Obese middle-aged Caucasian male no acute distress wearing sunglasses accompanied by wife HEENT: Normocephalic pupils equal , dentition adequate Neck: Supple without JVD, adenopathy, or bruit Chest: Clear to auscultation, symmetrical breath sounds, no rhonchi, no tenderness             or deformity Cardiovascular: Regular rate and rhythm, 1/6 AI murmur,, no gallop, peripheral pulses             palpable in all extremities Abdomen:  Soft, nontender, obese, no palpable mass or organomegaly Extremities: Warm, well-perfused, no clubbing cyanosis edema or tenderness,              no venous stasis changes of the legs Rectal/GU: Deferred Neuro: Grossly non--focal and symmetrical throughout Skin: Clean and dry  without rash or ulceration   Diagnostic Tests: Above studies all personally reviewed and discussed with patient The aortic size index to his body size is 5.4 cm/2.5 m = 2.2 which would place him at 4% annual risk for aortic dissection However with his positive family history of aortic dissection, his recent onset of persistent substernal discomfort-pressure and the fact there has been a 5 mm increase in size of his aorta since 2016 all are important factors in deciding when to proceed with aortic root replacement. At age 66 he could have either bioprosthetic or mechanical [name] and since he is on chronic anticoagulation for his atrial fibrillation a mechanical [name] may be his best option to avoid further surgery.  Impression: Patient will proceed with right and left heart cath for above reasons including risk for CAD and risk for pulmonary hypertension with sleep apnea and AI. He will also obtain PFTs and a dental evaluation  Plan:Return after above studies to discuss surgery.   Weslee Prestage Van Trigt III, MD [facility] [phone] 

## 2017-06-01 ENCOUNTER — Ambulatory Visit (HOSPITAL_COMMUNITY)
Admission: RE | Admit: 2017-06-01 | Discharge: 2017-06-01 | Disposition: A | Discharge status: Home / Self Care | Payer: Medicare Other | Source: Ambulatory Visit | Attending: Cardiothoracic Surgery | Admitting: Cardiothoracic Surgery

## 2017-06-01 DIAGNOSIS — Z01818 Encounter for other preprocedural examination: Secondary | ICD-10-CM | POA: Diagnosis present

## 2017-06-01 DIAGNOSIS — I7121 Aneurysm of the ascending aorta, without rupture: Secondary | ICD-10-CM

## 2017-06-01 DIAGNOSIS — I712 Thoracic aortic aneurysm, without rupture: Secondary | ICD-10-CM | POA: Diagnosis present

## 2017-06-01 LAB — PULMONARY FUNCTION TEST
DL/VA % pred: 98 %
DL/VA: 4.55 ml/min/mmHg/L
DLCO unc % pred: 101 %
DLCO unc: 32.71 ml/min/mmHg
FEF 25-75 Post: 4.64 L/sec
FEF 25-75 Pre: 3.57 L/sec
FEF2575-%Change-Post: 30 %
FEF2575-%Pred-Post: 167 %
FEF2575-%Pred-Pre: 128 %
FEV1-%Change-Post: 6 %
FEV1-%Pred-Post: 106 %
FEV1-%Pred-Pre: 100 %
FEV1-Post: 3.71 L
FEV1-Pre: 3.49 L
FEV1FVC-%Change-Post: 2 %
FEV1FVC-%Pred-Pre: 108 %
FEV6-%Change-Post: 3 %
FEV6-%Pred-Post: 101 %
FEV6-%Pred-Pre: 97 %
FEV6-Post: 4.46 L
FEV6-Pre: 4.3 L
FEV6FVC-%Change-Post: 0 %
FEV6FVC-%Pred-Post: 105 %
FEV6FVC-%Pred-Pre: 105 %
FVC-%Change-Post: 3 %
FVC-%Pred-Post: 96 %
FVC-%Pred-Pre: 92 %
FVC-Post: 4.46 L
FVC-Pre: 4.31 L
Post FEV1/FVC ratio: 83 %
Post FEV6/FVC ratio: 100 %
Pre FEV1/FVC ratio: 81 %
Pre FEV6/FVC Ratio: 100 %
RV % pred: 124 %
RV: 2.9 L
TLC % pred: 107 %
TLC: 7.52 L

## 2017-06-01 MED ORDER — ALBUTEROL SULFATE (2.5 MG/3ML) 0.083% IN NEBU
2.5000 mg | INHALATION_SOLUTION | Freq: Once | RESPIRATORY_TRACT | Status: AC
  Administered 2017-06-01: 2.5 mg via RESPIRATORY_TRACT

## 2017-06-03 ENCOUNTER — Telehealth: Payer: Self-pay | Admitting: Internal Medicine

## 2017-06-03 DIAGNOSIS — G4733 Obstructive sleep apnea (adult) (pediatric): Secondary | ICD-10-CM

## 2017-06-03 NOTE — Telephone Encounter (Signed)
Spoke with pt's wife, states that pt's ramp-up time is set on a timer, and pt does not want this available.  Pt wants the cpap pressure to start up immediately when he turns on machine.  Pt also wants to have the ability to adjust pressure on his cpap.  I verified that his pressure is auto-titrating, but apparently pt was able to choose a set pressure if he felt like he needed a set pressure on his old machine instead of the autotitration, and pt wishes to have this capability again. Pt was given an Airsense 10 by [facility] in June, but because he cannot adjust pressure and ramp time manually he has been using his old machine.    CY please advise. Thanks.

## 2017-06-03 NOTE — Telephone Encounter (Signed)
Ok to order his DME change RAMP to zero.  He may want to adjust his pressure that won't necessarily be the pressure that controls his apneas. If he feels his pressure is too low, or too high, we can adjust the range.

## 2017-06-03 NOTE — Telephone Encounter (Signed)
Left message for pts wife to call back. 

## 2017-06-04 ENCOUNTER — Telehealth (HOSPITAL_COMMUNITY): Payer: Self-pay | Admitting: *Deleted

## 2017-06-04 NOTE — Telephone Encounter (Signed)
Called pt wife Tye Maryland back -- LM to return call x 1

## 2017-06-04 NOTE — Telephone Encounter (Signed)
lmtcb x2 for pt's wife. 

## 2017-06-04 NOTE — Telephone Encounter (Signed)
lmtcb x1 for pt's wife. 

## 2017-06-04 NOTE — Telephone Encounter (Signed)
Per Dr Prescott Gum pt needs L/R [facility] w/Dr Aundra Dubin, spoke w/pt's wife, pt is sch for Fri 8/24 at 10:30 all instructions reviewed w/her via phone.  Pt is on Xarelto, will discuss this w/Dr Aundra Dubin and call her back with instructions on holding Xarelto.

## 2017-06-04 NOTE — Telephone Encounter (Signed)
[name] returning call from yesterday for her husband.  She asked if she could be called back after 10:30.  Tye Maryland [phone]

## 2017-06-04 NOTE — Telephone Encounter (Signed)
Pt wife Tye Maryland) returned phone call, can be reached at [phone] till 4pm..ert

## 2017-06-05 ENCOUNTER — Telehealth (HOSPITAL_COMMUNITY): Payer: Self-pay | Admitting: *Deleted

## 2017-06-05 ENCOUNTER — Other Ambulatory Visit (HOSPITAL_COMMUNITY): Payer: Self-pay | Admitting: *Deleted

## 2017-06-05 DIAGNOSIS — I351 Nonrheumatic aortic (valve) insufficiency: Secondary | ICD-10-CM

## 2017-06-05 NOTE — Telephone Encounter (Signed)
Grant [name] scheduled l/r heart cath for Dr.Van Trigt his office was notified to obtain precert.

## 2017-06-05 NOTE — Telephone Encounter (Signed)
Called and spoke with pts wife and she stated that they use [facility] and she is aware of change in ramp.  This order has been sent to [facility] and they will call if this does not help with the pressure setting.

## 2017-06-05 NOTE — Telephone Encounter (Signed)
Per Dr Aundra Dubin hold Xarelto 2 days: Grant Dresser, MD  Grant Calico, RN        2 days prior    [name] is aware

## 2017-06-12 ENCOUNTER — Encounter (HOSPITAL_COMMUNITY): Payer: Self-pay

## 2017-06-12 ENCOUNTER — Encounter (HOSPITAL_COMMUNITY)
Admission: RE | Disposition: A | Discharge status: Home / Self Care | Payer: Self-pay | Source: Ambulatory Visit | Attending: Cardiology

## 2017-06-12 ENCOUNTER — Ambulatory Visit (HOSPITAL_COMMUNITY)
Admission: RE | Admit: 2017-06-12 | Discharge: 2017-06-12 | Disposition: A | Discharge status: Home / Self Care | Payer: Medicare Other | Source: Ambulatory Visit | Attending: Cardiology | Admitting: Cardiology

## 2017-06-12 DIAGNOSIS — Z6837 Body mass index (BMI) 37.0-37.9, adult: Secondary | ICD-10-CM | POA: Diagnosis not present

## 2017-06-12 DIAGNOSIS — Z79899 Other long term (current) drug therapy: Secondary | ICD-10-CM | POA: Insufficient documentation

## 2017-06-12 DIAGNOSIS — Z7983 Long term (current) use of bisphosphonates: Secondary | ICD-10-CM | POA: Insufficient documentation

## 2017-06-12 DIAGNOSIS — M797 Fibromyalgia: Secondary | ICD-10-CM | POA: Diagnosis not present

## 2017-06-12 DIAGNOSIS — F17210 Nicotine dependence, cigarettes, uncomplicated: Secondary | ICD-10-CM | POA: Diagnosis not present

## 2017-06-12 DIAGNOSIS — I10 Essential (primary) hypertension: Secondary | ICD-10-CM | POA: Insufficient documentation

## 2017-06-12 DIAGNOSIS — Z7901 Long term (current) use of anticoagulants: Secondary | ICD-10-CM | POA: Insufficient documentation

## 2017-06-12 DIAGNOSIS — I350 Nonrheumatic aortic (valve) stenosis: Secondary | ICD-10-CM | POA: Diagnosis not present

## 2017-06-12 DIAGNOSIS — Z87891 Personal history of nicotine dependence: Secondary | ICD-10-CM | POA: Diagnosis not present

## 2017-06-12 DIAGNOSIS — G4733 Obstructive sleep apnea (adult) (pediatric): Secondary | ICD-10-CM | POA: Insufficient documentation

## 2017-06-12 DIAGNOSIS — I712 Thoracic aortic aneurysm, without rupture: Secondary | ICD-10-CM | POA: Diagnosis not present

## 2017-06-12 DIAGNOSIS — E039 Hypothyroidism, unspecified: Secondary | ICD-10-CM | POA: Insufficient documentation

## 2017-06-12 DIAGNOSIS — I480 Paroxysmal atrial fibrillation: Secondary | ICD-10-CM | POA: Insufficient documentation

## 2017-06-12 DIAGNOSIS — I351 Nonrheumatic aortic (valve) insufficiency: Secondary | ICD-10-CM

## 2017-06-12 DIAGNOSIS — R5382 Chronic fatigue, unspecified: Secondary | ICD-10-CM | POA: Diagnosis not present

## 2017-06-12 HISTORY — PX: CORONARY ANGIOPLASTY: SHX604

## 2017-06-12 HISTORY — PX: RIGHT/LEFT HEART CATH AND CORONARY ANGIOGRAPHY: CATH118266

## 2017-06-12 LAB — POCT I-STAT 3, VENOUS BLOOD GAS (G3P V)
Acid-Base Excess: 1 mmol/L (ref 0.0–2.0)
Acid-Base Excess: 1 mmol/L (ref 0.0–2.0)
Bicarbonate: 26.1 mmol/L (ref 20.0–28.0)
Bicarbonate: 26.7 mmol/L (ref 20.0–28.0)
O2 Saturation: 66 %
O2 Saturation: 67 %
TCO2: 27 mmol/L (ref 22–32)
TCO2: 28 mmol/L (ref 22–32)
pCO2, Ven: 44.1 mmHg (ref 44.0–60.0)
pCO2, Ven: 44.9 mmHg (ref 44.0–60.0)
pH, Ven: 7.379 (ref 7.250–7.430)
pH, Ven: 7.382 (ref 7.250–7.430)
pO2, Ven: 35 mmHg (ref 32.0–45.0)
pO2, Ven: 36 mmHg (ref 32.0–45.0)

## 2017-06-12 LAB — BASIC METABOLIC PANEL
Anion gap: 7 (ref 5–15)
BUN: 13 mg/dL (ref 6–20)
CO2: 26 mmol/L (ref 22–32)
Calcium: 9.3 mg/dL (ref 8.9–10.3)
Chloride: 106 mmol/L (ref 101–111)
Creatinine, Ser: 1.16 mg/dL (ref 0.61–1.24)
GFR calc Af Amer: >60 mL/min (ref >60)
GFR calc non Af Amer: >60 mL/min (ref >60)
Glucose, Bld: 100 mg/dL — ABNORMAL HIGH (ref 65–99)
Potassium: 3.8 mmol/L (ref 3.5–5.1)
Sodium: 139 mmol/L (ref 135–145)

## 2017-06-12 LAB — CBC
HCT: 44.7 % (ref 39.0–52.0)
Hemoglobin: 15.1 g/dL (ref 13.0–17.0)
MCH: 30.7 pg (ref 26.0–34.0)
MCHC: 33.8 g/dL (ref 30.0–36.0)
MCV: 90.9 fL (ref 78.0–100.0)
Platelets: 192 10*3/uL (ref 150–400)
RBC: 4.92 MIL/uL (ref 4.22–5.81)
RDW: 13.2 % (ref 11.5–15.5)
WBC: 6.8 10*3/uL (ref 4.0–10.5)

## 2017-06-12 LAB — PROTIME-INR
INR: 1.06
Prothrombin Time: 13.8 seconds (ref 11.4–15.2)

## 2017-06-12 SURGERY — RIGHT/LEFT HEART CATH AND CORONARY ANGIOGRAPHY
Anesthesia: LOCAL

## 2017-06-12 MED ORDER — ONDANSETRON HCL 4 MG/2ML IJ SOLN: 4.0000 mg | Freq: Four times a day (QID) | INTRAMUSCULAR | Status: DC | PRN

## 2017-06-12 MED ORDER — SODIUM CHLORIDE 0.9% FLUSH: 3.0000 mL | Freq: Two times a day (BID) | INTRAVENOUS | Status: DC

## 2017-06-12 MED ORDER — MIDAZOLAM HCL 2 MG/2ML IJ SOLN
INTRAMUSCULAR | Status: DC | PRN
  Administered 2017-06-12: 1 mg via INTRAVENOUS

## 2017-06-12 MED ORDER — SODIUM CHLORIDE 0.9 % IV SOLN
INTRAVENOUS | Status: DC
  Administered 2017-06-12: 09:00:00 via INTRAVENOUS

## 2017-06-12 MED ORDER — MIDAZOLAM HCL 2 MG/2ML IJ SOLN
INTRAMUSCULAR | Status: AC
  Filled 2017-06-12: qty 2

## 2017-06-12 MED ORDER — HEPARIN (PORCINE) IN NACL 2-0.9 UNIT/ML-% IJ SOLN
INTRAMUSCULAR | Status: AC
  Filled 2017-06-12: qty 1000

## 2017-06-12 MED ORDER — IOPAMIDOL (ISOVUE-370) INJECTION 76%
INTRAVENOUS | Status: AC
  Filled 2017-06-12: qty 50

## 2017-06-12 MED ORDER — SODIUM CHLORIDE 0.9 % WEIGHT BASED INFUSION
1.0000 mL/kg/h | INTRAVENOUS | Status: DC
  Administered 2017-06-12: 1 mL/kg/h via INTRAVENOUS

## 2017-06-12 MED ORDER — VERAPAMIL HCL 2.5 MG/ML IV SOLN
INTRAVENOUS | Status: AC
  Filled 2017-06-12: qty 2

## 2017-06-12 MED ORDER — SODIUM CHLORIDE 0.9% FLUSH: 3.0000 mL | INTRAVENOUS | Status: DC | PRN

## 2017-06-12 MED ORDER — IOPAMIDOL (ISOVUE-370) INJECTION 76%
INTRAVENOUS | Status: DC | PRN
  Administered 2017-06-12: 185 mL via INTRA_ARTERIAL

## 2017-06-12 MED ORDER — ACETAMINOPHEN 325 MG PO TABS: 650.0000 mg | ORAL_TABLET | ORAL | Status: DC | PRN

## 2017-06-12 MED ORDER — LIDOCAINE HCL (PF) 1 % IJ SOLN
INTRAMUSCULAR | Status: DC | PRN
  Administered 2017-06-12 (×2): 2 mL

## 2017-06-12 MED ORDER — HEPARIN SODIUM (PORCINE) 1000 UNIT/ML IJ SOLN
INTRAMUSCULAR | Status: AC
  Filled 2017-06-12: qty 1

## 2017-06-12 MED ORDER — HEPARIN (PORCINE) IN NACL 2-0.9 UNIT/ML-% IJ SOLN
INTRAMUSCULAR | Status: AC | PRN
  Administered 2017-06-12 (×2): 1000 mL

## 2017-06-12 MED ORDER — LIDOCAINE HCL 2 % IJ SOLN
INTRAMUSCULAR | Status: AC
  Filled 2017-06-12: qty 10

## 2017-06-12 MED ORDER — FENTANYL CITRATE (PF) 100 MCG/2ML IJ SOLN
INTRAMUSCULAR | Status: DC | PRN
  Administered 2017-06-12: 25 ug via INTRAVENOUS

## 2017-06-12 MED ORDER — IOPAMIDOL (ISOVUE-370) INJECTION 76%
INTRAVENOUS | Status: AC
  Filled 2017-06-12: qty 100

## 2017-06-12 MED ORDER — SODIUM CHLORIDE 0.9 % IV SOLN: 250.0000 mL | INTRAVENOUS | Status: DC | PRN

## 2017-06-12 MED ORDER — HEPARIN SODIUM (PORCINE) 1000 UNIT/ML IJ SOLN
INTRAMUSCULAR | Status: DC | PRN
  Administered 2017-06-12: 6100 [IU] via INTRAVENOUS

## 2017-06-12 MED ORDER — VERAPAMIL HCL 2.5 MG/ML IV SOLN
INTRAVENOUS | Status: DC | PRN
  Administered 2017-06-12 (×2): 10 mL via INTRA_ARTERIAL

## 2017-06-12 MED ORDER — FENTANYL CITRATE (PF) 100 MCG/2ML IJ SOLN
INTRAMUSCULAR | Status: AC
  Filled 2017-06-12: qty 2

## 2017-06-12 MED ORDER — ASPIRIN 81 MG PO CHEW
81.0000 mg | CHEWABLE_TABLET | ORAL | Status: AC
  Administered 2017-06-12: 81 mg via ORAL

## 2017-06-12 MED ORDER — ASPIRIN 81 MG PO CHEW
CHEWABLE_TABLET | ORAL | Status: AC
  Administered 2017-06-12: 81 mg via ORAL
  Filled 2017-06-12: qty 1

## 2017-06-12 SURGICAL SUPPLY — 16 items
CATH 5FR JL3.5 JR4 ANG PIG MP (CATHETERS) ×1 IMPLANT
CATH BALLN WEDGE 5F 110CM (CATHETERS) ×1 IMPLANT
CATH INFINITI 5 FR 3DRC (CATHETERS) ×1 IMPLANT
CATH INFINITI 5FR AL1 (CATHETERS) ×1 IMPLANT
CATH INFINITI 5FR JL4 (CATHETERS) ×1 IMPLANT
CATH LAUNCHER 5F RADR (CATHETERS) IMPLANT
CATHETER LAUNCHER 5F RADR (CATHETERS) ×2
DEVICE RAD COMP TR BAND LRG (VASCULAR PRODUCTS) ×1 IMPLANT
GLIDESHEATH SLEND SS 6F .021 (SHEATH) ×1 IMPLANT
GUIDEWIRE INQWIRE 1.5J.035X260 (WIRE) IMPLANT
INQWIRE 1.5J .035X260CM (WIRE) ×2
KIT HEART LEFT (KITS) ×2 IMPLANT
PACK CARDIAC CATHETERIZATION (CUSTOM PROCEDURE TRAY) ×2 IMPLANT
SHEATH GLIDE SLENDER 4/5FR (SHEATH) ×1 IMPLANT
TRANSDUCER W/STOPCOCK (MISCELLANEOUS) ×2 IMPLANT
TUBING CIL FLEX 10 FLL-RA (TUBING) ×2 IMPLANT

## 2017-06-12 NOTE — Discharge Instructions (Signed)

## 2017-06-12 NOTE — Progress Notes (Signed)
Right antecubital brachial sheath removed. Manual pressure held for 10 minutes. No bleeding noted, area remains Level 0. 4x4/Tegaderm/CoFlex applied over area.

## 2017-06-12 NOTE — Interval H&P Note (Signed)
History and Physical Interval Note:  06/12/2017 12:25 PM  Grant [name]  has presented today for surgery, with the diagnosis of aortic insufficiency, thoracic aortic anuerysm   The various methods of treatment have been discussed with the patient and family. After consideration of risks, benefits and other options for treatment, the patient has consented to  Procedure(s): RIGHT/LEFT HEART CATH AND CORONARY ANGIOGRAPHY (N/A) as a surgical intervention .  The patient's history has been reviewed, patient examined, no change in status, stable for surgery.  I have reviewed the patient's chart and labs.  Questions were answered to the patient's satisfaction.     Ignazio Kincaid [employer]

## 2017-06-12 NOTE — Progress Notes (Signed)
Vin, PA for Cardiology returned page. Okay for pt to resume Xarelto tonight. Pt and pt's wife verbalized understanding.

## 2017-06-12 NOTE — H&P (View-Only) (Signed)
PCP is Shirline Frees, MD Referring Provider is Josue Hector, MD  Chief Complaint  Patient presents with  . Thoracic Aortic Aneurysm    Surgical eval, CTA Chest 05/12/17, ECHO 05/14/17.Marland KitchenMarland KitchenBICUSPID AORTIC VALVE with MODERATE REGURG  Patient examined, CTA of chest, cardiac CT scan, echocardiogram images all personally reviewed and counseled with patient and wife  HPI: 66 year old morbid obese hypertensive reformed smoker with known moderate aortic insufficiency and bicuspid aortic valve with slowly enlarging aortic root and ascending aortic fusiform aneurysm. In 2016 CTA measurement showed the aortic root at 4.9 cm. Recently his echo and CTA showed the ascending aorta at 5.4 cm. The patient has hypertension and is fairly compliant with his medications. He stopped smoking 30 years ago but has COPD-bronchitis His mother died from aortic dissection with a known fusiform ascending aneurysm. The patient states he has had some vague midsternal discomfort for the past 4 weeks which is present continuously.  Echocardiogram shows a probable bicuspid aortic valve with moderate aortic insufficiency but no LV dilatation. The patient has no symptoms of CHF.  The patient recently had a cardiac CTA and stress test-Myoview. This showed ejection fraction of 50%. Negative for ischemia. The cardiac CTA images are not clear enough to rule out significant CAD pre-op aortic root replacement. The patient will need a left and right heart cath prior to elective aortic root replacement. He has significant sleep apnea.  Patient has a pulmonologist - Dr. Annamaria Boots and recently had spirometry but not full PFTs. The patient has sleep apnea and was treated recently for bronchitis with a Z-Pak.  Patient has no active dental complaints and has his teeth cleaned every 6 months. He will need pre-dental evaluation and clearance prior to elective aVR and root replacement  The patient has history of transient atrial [place] the [place] 3 [place]. He has been on xarelto  and a beta blocker since then without recurrent A. fib. He did not require cardioversion.  Past Medical History:  Diagnosis Date  . Allergic rhinitis   . Anxiety   . Bladder tumor   . Chronic fatigue   . Chronic fatigue   . Depression   . Fibromyalgia   . GERD (gastroesophageal reflux disease)   . History of chronic bronchitis   . History of migraine   . History of toxic encephalopathy   . Hypothyroidism   . OSA on CPAP    CPAP 14  . PAF (paroxysmal atrial fibrillation) ([place]) CARDIOLOGIST -- DR [name]   DX OCT 2013  . 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 Unspecified essential hypertension   . Urethral tumor    PROSTATIC    Past Surgical History:  Procedure Laterality Date  . CYSTOSCOPY W/ RETROGRADES Bilateral 06/15/2013   Procedure: CYSTOSCOPY WITH BILATERAL RETROGRADE PYELOGRAM  BLADDER BIOPSY, PROSTATIC URETHRAL BIOPSY, ;  Surgeon: Molli Hazard, MD;  Location: [facility];  Service: Urology;  Laterality: Bilateral;  . LAPAROSCOPIC CHOLECYSTECTOMY  01-14-2001  . UMBILICAL HERNIA REPAIR  01-14-2008    Family History  Problem Relation Age of Onset  . Aortic aneurysm Mother 17       cause of death  . Other Father        motor vehicle accident  . Heart disease Unknown        family history    Social History Social History  Substance Use Topics  . Smoking status: Former Smoker    Packs/day: 0.50    Years: 27.00  Types: Cigarettes    Quit date: 10/21/1979  . Smokeless tobacco: Never Used  . Alcohol use 12.6 oz/week    21 Cans of beer per week    Current Outpatient Prescriptions  Medication Sig Dispense Refill  . albuterol (VENTOLIN HFA) 108 (90 Base) MCG/ACT inhaler INHALE 2 PUFFS INTO LUNGS EVERY 6 HOURS AS NEEDED FOR WHEEZING OR SHORTNESS OF BREATH 18 g 12  . azelastine (ASTELIN) 137 MCG/SPRAY nasal spray Place 1-2 sprays into the nose 2 (two) times daily as needed for rhinitis or allergies.      . benzonatate (TESSALON) 200 MG capsule Take 1 capsule (200 mg total) by mouth every 6 (six) hours as needed for cough. (Patient taking differently: Take 200 mg by mouth daily with breakfast. ) 30 capsule 0  . budesonide-formoterol (SYMBICORT) 160-4.5 MCG/ACT inhaler INHALE 2 PUFF INTO THE LUNGS 2 TIMES A DAY. 10.2 g 12  . Cholecalciferol (VITAMIN D-3) 5000 UNITS TABS Take 5,000 Units by mouth daily with breakfast.     . COLCRYS 0.6 MG tablet Take 0.6 mg by mouth daily with breakfast.     . dextromethorphan (DELSYM) 30 MG/5ML liquid Take 30 mg by mouth at bedtime as needed for cough.     Marland Kitchen DHEA 50 MG TABS Take 1 tablet by mouth daily with breakfast.     . fexofenadine (ALLEGRA) 180 MG tablet Take 180 mg by mouth daily with breakfast.     . finasteride (PROSCAR) 5 MG tablet Take 5 mg by mouth at bedtime.     . Hypromellose (ALZAIR ALLERGY NASAL SPRAY NA) Place 1 spray into both nostrils daily as needed (for allergie).    Marland Kitchen levothyroxine (SYNTHROID, LEVOTHROID) 112 MCG tablet Take 112 mcg by mouth daily before breakfast.     . liothyronine (CYTOMEL) 25 MCG tablet Take 25 mcg by mouth every morning.     Marland Kitchen losartan (COZAAR) 100 MG tablet Take 1 tablet (100 mg total) by mouth daily. (Patient taking differently: Take 100 mg by mouth daily with breakfast. ) 90 tablet 1  . metoprolol succinate (TOPROL-XL) 100 MG 24 hr tablet Take 1 tablet (100 mg total) by mouth every evening. Take with or immediately following a meal. 90 tablet 2  . Multiple Vitamins-Minerals (ONE-A-DAY MENS HEALTH FORMULA PO) Take 1 tablet by mouth daily with breakfast.     . naproxen sodium (ANAPROX) 220 MG tablet Take 220 mg by mouth every 12 (twelve) hours as needed (for pain).    Marland Kitchen neomycin-polymyxin-hydrocortisone (CORTISPORIN) 3.5-10000-1 otic suspension Place 4 drops in ear(s) 3 (three) times daily as needed (for infected ears).     Marland Kitchen omeprazole (PRILOSEC) 40 MG capsule Take 1 capsule (40 mg total) by mouth 2 (two) times daily.  180 capsule 1  . perphenazine (TRILAFON) 2 MG tablet Take 2 mg by mouth 2 (two) times daily as needed (for migraines).     . Probiotic Product (ALIGN) 4 MG CAPS Take 1 capsule by mouth daily with breakfast.     . tamsulosin (FLOMAX) 0.4 MG CAPS capsule Take 0.4 mg by mouth daily after supper.    . testosterone cypionate (DEPOTESTOTERONE CYPIONATE) 100 MG/ML injection Inject 200 mg into the muscle every 7 (seven) days. For IM use only    . venlafaxine (EFFEXOR-XR) 150 MG 24 hr capsule Take 150 mg by mouth every morning.     . vitamin B-12 (CYANOCOBALAMIN) 1000 MCG tablet Take 1,000 mcg by mouth daily with breakfast.     . XARELTO 20 MG  TABS tablet Take 1 tablet (20 mg total) by mouth every evening. 90 tablet 2   No current facility-administered medications for this visit.     Allergies  Allergen Reactions  . Codeine Itching    Review of Systems         Review of Systems :  [ y ] = yes, [  ] = no        General :  Weight gain [   ]    Weight loss  [   ]  Fatigue [place].Blacker  ]  Fever no[  ]  Chills  [  ]                                Weakness  [  ]           HEENT    Headache [  ]  Dizziness [  ]  Blurred vision [ light sensitivity ] Glaucoma  [  ]                          Nosebleeds [  ] Painful or loose teeth [  ]        Cardiac :  Chest pain/ pressure [ yes ]  Resting SOB [  ] exertional SOB [  ]                        Orthopnea [  ]  Pedal edema  [  ]  Palpitations [  ] Syncope/presyncope [ ]                         Paroxysmal nocturnal dyspnea [  ]         Pulmonary : cough [  ]  wheezing [  ]  Hemoptysis [  ] Sputum [  ] Snoring [  ]                              Pneumothorax [  ]  Sleep apnea [  ]        GI : Vomiting [  ]  Dysphagia [  ]  Melena  [  ]  Abdominal pain [  ] BRBPR [  ]              Heart burn [  ]  Constipation [  ] Diarrhea  [  ] Colonoscopy [   ]        GU : Hematuria [  ]  Dysuria [  ]  Nocturia [  ] UTI's [  ]        Vascular : Claudication [  ]  Rest pain [  ]   DVT [  ] Vein stripping [  ] leg ulcers [  ]                          TIA [  ] Stroke [  ]  Varicose veins [  ]        NEURO :  Headaches  [ yes ] Seizures [  ] Vision changes [  ] Paresthesias [  ]  Seizures [  ]        Musculoskeletal :  Arthritis [  ] Gout  [  ]  Back pain [  ]  Joint pain [  ]        Skin :  Rash [  ]  Melanoma [  ] Sores [  ]        Heme : Bleeding problems [  ]Clotting Disorders [  ] Anemia [  ]Blood Transfusion [ ]         Endocrine : Diabetes [  ] Heat or Cold intolerance [  ] Polyuria [  ]excessive thirst [ ]         Psych : Depression [  ]  Anxiety [  ]  Psych hospitalizations [  ] Memory change [  ]      The patient is status post cholecystectomy and repair of a local hernia under general anesthesia at [place] without complication.  No history of thoracic trauma rib fracture or pneumothorax. No bleeding difficulties well on xarelto                                          BP (!) 148/86 (BP Location: Left Arm, Patient Position: Sitting, Cuff Size: Large)   Pulse 65   Resp 16   Ht 5\' 10"  (1.778 m)   Wt 282 lb (127.9 kg)   SpO2 95% Comment: ON RA  BMI 40.46 kg/m  Physical Exam     Physical Exam  General: Obese middle-aged Caucasian male no acute distress wearing sunglasses accompanied by wife HEENT: Normocephalic pupils equal , dentition adequate Neck: Supple without JVD, adenopathy, or bruit Chest: Clear to auscultation, symmetrical breath sounds, no rhonchi, no tenderness             or deformity Cardiovascular: Regular rate and rhythm, 1/6 AI murmur,, no gallop, peripheral pulses             palpable in all extremities Abdomen:  Soft, nontender, obese, no palpable mass or organomegaly Extremities: Warm, well-perfused, no clubbing cyanosis edema or tenderness,              no venous stasis changes of the legs Rectal/GU: Deferred Neuro: Grossly non--focal and symmetrical throughout Skin: Clean and dry  without rash or ulceration   Diagnostic Tests: Above studies all personally reviewed and discussed with patient The aortic size index to his body size is 5.4 cm/2.5 m = 2.2 which would place him at 4% annual risk for aortic dissection However with his positive family history of aortic dissection, his recent onset of persistent substernal discomfort-pressure and the fact there has been a 5 mm increase in size of his aorta since 2016 all are important factors in deciding when to proceed with aortic root replacement. At age 67 he could have either bioprosthetic or mechanical valve and since he is on chronic anticoagulation for his atrial fibrillation a mechanical valve may be his best option to avoid further surgery.  Impression: Patient will proceed with right and left heart cath for above reasons including risk for CAD and risk for pulmonary hypertension with sleep apnea and AI. He will also obtain PFTs and a dental evaluation  Plan:Return after above studies to discuss surgery.   Len Childs, MD [facility] [phone]

## 2017-06-15 ENCOUNTER — Encounter (HOSPITAL_COMMUNITY): Payer: Self-pay | Admitting: Cardiology

## 2017-06-17 ENCOUNTER — Other Ambulatory Visit: Payer: Self-pay | Admitting: *Deleted

## 2017-06-17 ENCOUNTER — Ambulatory Visit (INDEPENDENT_AMBULATORY_CARE_PROVIDER_SITE_OTHER): Payer: Medicare Other | Admitting: Cardiothoracic Surgery

## 2017-06-17 ENCOUNTER — Encounter: Payer: Self-pay | Admitting: Cardiothoracic Surgery

## 2017-06-17 VITALS — BP 139/90 | HR 78 | Resp 20 | Ht 71.0 in | Wt 240.0 lb

## 2017-06-17 DIAGNOSIS — I719 Aortic aneurysm of unspecified site, without rupture: Secondary | ICD-10-CM

## 2017-06-17 DIAGNOSIS — Q231 Congenital insufficiency of aortic valve: Secondary | ICD-10-CM

## 2017-06-17 DIAGNOSIS — I712 Thoracic aortic aneurysm, without rupture, unspecified: Secondary | ICD-10-CM

## 2017-06-17 DIAGNOSIS — I7121 Aneurysm of the ascending aorta, without rupture: Secondary | ICD-10-CM

## 2017-06-17 DIAGNOSIS — I480 Paroxysmal atrial fibrillation: Secondary | ICD-10-CM

## 2017-06-17 DIAGNOSIS — I351 Nonrheumatic aortic (valve) insufficiency: Secondary | ICD-10-CM

## 2017-06-17 NOTE — Progress Notes (Signed)
PCP is Shirline Frees, MD Referring Provider is Josue Hector, MD  Chief Complaint  Patient presents with  . Thoracic Aortic Aneurysm    further discuss surgery, review Cardiac Cath and PFT's   Morbidly obese 66 year old hypertensive male with chronic fatigue syndrome and sleep apnea with a 5.4 cm aortic root aneurysm with moderate AI and bicuspid valve with needs a aortic root replacement plan for a biologic-Bentall procedure. He returns to discuss results of his (heart cath and PFTs.  HPI:66 year old morbid obese hypertensive reformed smoker with known moderate aortic insufficiency and bicuspid aortic valve with slowly enlarging aortic root and ascending aortic fusiform aneurysm. In 2016 CTA measurement showed the aortic root at 4.9 cm. Recently his echo and CTA showed the ascending aorta at 5.4 cm. The patient has hypertension and is fairly compliant with his medications. He stopped smoking 30 years ago but has COPD-bronchitis His mother died from aortic dissection with a known fusiform ascending aneurysm. The patient states he has had some vague midsternal discomfort for the past 4 weeks which is present continuously.   Echocardiogram shows a probable bicuspid aortic valve with moderate aortic insufficiency but no LV dilatation. The patient has no symptoms of CHF.   The patient recently had a cardiac CTA and stress test-Myoview. This showed ejection fraction of 50%. Negative for ischemia. The cardiac CTA images are not clear enough to rule out significant CAD pre-op aortic root replacement. The patient will need a left and right heart cath prior to elective aortic root replacement. He has significant sleep apnea.   Patient has a pulmonologist - Dr. Annamaria Boots and recently had spirometry but not full PFTs. The patient has sleep apnea and was treated recently for bronchitis with a Z-Pak.   Patient has no active dental complaints and has his teeth cleaned every 6 months. He will need pre-dental  evaluation and clearance prior to elective aVR and root replacement   The patient has history of transient atrial [place]. He has been on xarelto  and a beta blocker since then without recurrent A. fib. He did not require cardioversion.  The patient's coronary arteriograms were personally reviewed. He has clean coronaries with mildly elevated right-sided pressures. PFTs show adequate mechanics [FVC, FEV1] and diffusion capacity greater than 90%. The patient has been examined by his dentist  and was cleared  for heart valve surgery.  Patient will be scheduled for a biologic Bentall procedure using a bovine pericardial valve and Dacron graft  toreplace the aortic root and ascending aorta. He may require hypothermic circulatory arrest to perform the distal aortic anastomosis.   The patient will stop his xarelto one week before surgery.   Past Medical History:  Diagnosis Date  . Allergic rhinitis   . Anxiety   . Bladder tumor   . Chronic fatigue   . Chronic fatigue   . Depression   . Fibromyalgia   . GERD (gastroesophageal reflux disease)   . History of chronic bronchitis   . History of migraine   . History of toxic encephalopathy   . Hypothyroidism   . OSA on CPAP    CPAP 14  . PAF (paroxysmal atrial fibrillation) ([place]) CARDIOLOGIST -- DR [name]   DX OCT 2013  . 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 Unspecified essential hypertension   . Urethral tumor    PROSTATIC    Past Surgical History:  Procedure Laterality Date  . CYSTOSCOPY W/ RETROGRADES Bilateral 06/15/2013   Procedure:  CYSTOSCOPY WITH BILATERAL RETROGRADE PYELOGRAM  BLADDER BIOPSY, PROSTATIC URETHRAL BIOPSY, ;  Surgeon: Molli Hazard, MD;  Location: [facility];  Service: Urology;  Laterality: Bilateral;  . LAPAROSCOPIC CHOLECYSTECTOMY  01-14-2001  . RIGHT/LEFT HEART CATH AND CORONARY ANGIOGRAPHY N/A 06/12/2017   Procedure: RIGHT/LEFT HEART CATH AND CORONARY ANGIOGRAPHY;   Surgeon: Larey Dresser, MD;  Location: [facility];  Service: Cardiovascular;  Laterality: N/A;  . UMBILICAL HERNIA REPAIR  01-14-2008    Family History  Problem Relation Age of Onset  . Aortic aneurysm Mother 18       cause of death  . Other Father        motor vehicle accident  . Heart disease Unknown        family history    Social History Social History  Substance Use Topics  . Smoking status: Former Smoker    Packs/day: 0.50    Years: 27.00    Types: Cigarettes    Quit date: 10/21/1979  . Smokeless tobacco: Never Used  . Alcohol use 12.6 oz/week    21 Cans of beer per week    Current Outpatient Prescriptions  Medication Sig Dispense Refill  . albuterol (VENTOLIN HFA) 108 (90 Base) MCG/ACT inhaler INHALE 2 PUFFS INTO LUNGS EVERY 6 HOURS AS NEEDED FOR WHEEZING OR SHORTNESS OF BREATH 18 g 12  . azelastine (ASTELIN) 137 MCG/SPRAY nasal spray Place 1-2 sprays into the nose 2 (two) times daily as needed for rhinitis or allergies.     . benzonatate (TESSALON) 200 MG capsule Take 1 capsule (200 mg total) by mouth every 6 (six) hours as needed for cough. 30 capsule 0  . budesonide-formoterol (SYMBICORT) 160-4.5 MCG/ACT inhaler INHALE 2 PUFF INTO THE LUNGS 2 TIMES A DAY. 10.2 g 12  . Cholecalciferol (VITAMIN D-3) 5000 UNITS TABS Take 5,000 Units by mouth daily with breakfast.     . COLCRYS 0.6 MG tablet Take 0.6 mg by mouth every other day. With breakfast    . Cyanocobalamin (VITAMIN B12 SL) Place 1,200 mcg under the tongue daily. 1 dropper    . dextromethorphan (DELSYM) 30 MG/5ML liquid Take 30 mg by mouth at bedtime as needed for cough.     Marland Kitchen DHEA 50 MG TABS Take 50 mg by mouth daily with breakfast.     . fexofenadine (ALLEGRA) 180 MG tablet Take 180 mg by mouth daily with breakfast.     . finasteride (PROSCAR) 5 MG tablet Take 5 mg by mouth at bedtime.     . Hypromellose (ALZAIR ALLERGY NASAL SPRAY NA) Place 1 spray into both nostrils daily as needed (for allergie).     Marland Kitchen levothyroxine (SYNTHROID, LEVOTHROID) 112 MCG tablet Take 112 mcg by mouth daily before breakfast.     . liothyronine (CYTOMEL) 25 MCG tablet Take 25 mcg by mouth every morning.     Marland Kitchen losartan (COZAAR) 100 MG tablet Take 1 tablet (100 mg total) by mouth daily. (Patient taking differently: Take 100 mg by mouth daily with breakfast. ) 90 tablet 1  . Menthol, Topical Analgesic, (BLUE-EMU MAXIMUM STRENGTH EX) Apply 1 application topically 4 (four) times daily as needed (for arthritis pain.).    Marland Kitchen metoprolol succinate (TOPROL-XL) 100 MG 24 hr tablet Take 1 tablet (100 mg total) by mouth every evening. Take with or immediately following a meal. 90 tablet 2  . milk thistle 175 MG tablet Take 175 mg by mouth daily.    . Multiple Vitamins-Minerals (ONE-A-DAY MENS HEALTH FORMULA  PO) Take 1 tablet by mouth daily with breakfast.     . naproxen sodium (ANAPROX) 220 MG tablet Take 220 mg by mouth every 12 (twelve) hours as needed (for pain).    Marland Kitchen neomycin-polymyxin-hydrocortisone (CORTISPORIN) 3.5-10000-1 otic suspension Place 4 drops in ear(s) 3 (three) times daily as needed (for infected ears).     Marland Kitchen omeprazole (PRILOSEC) 40 MG capsule Take 1 capsule (40 mg total) by mouth 2 (two) times daily. 180 capsule 1  . perphenazine (TRILAFON) 2 MG tablet Take 2 mg by mouth 2 (two) times daily as needed (for migraines).     . Phenylephrine-DM-GG 5-10-200 MG TABS Take 2 tablets by mouth 2 (two) times daily as needed (for congestion/cough).    . Probiotic Product (ALIGN) 4 MG CAPS Take 4 mg by mouth daily with breakfast.     . tamsulosin (FLOMAX) 0.4 MG CAPS capsule Take 0.4 mg by mouth daily after supper.    . testosterone cypionate (DEPOTESTOTERONE CYPIONATE) 100 MG/ML injection Inject 200 mg into the muscle every Thursday. For IM use only     . trolamine salicylate (ASPERCREME) 10 % cream Apply 1 application topically 4 (four) times daily as needed (for arthritis pain.).    Marland Kitchen venlafaxine (EFFEXOR-XR) 150 MG 24 hr  capsule Take 150 mg by mouth every morning.     Alveda Reasons 20 MG TABS tablet Take 1 tablet (20 mg total) by mouth every evening. 90 tablet 2   No current facility-administered medications for this visit.     Allergies  Allergen Reactions  . Codeine Itching    Review of Systems   The patient's persistent dull anterior to posterior chest pain has been unchanged He denies any recent symptoms of upper respiratory infection No change in bowel habits No bleeding from his wrist for the radial artery puncture for cardiac catheterization. No fever or change in weight No ankle edema No joint pain suggesting gout Plans on getting a steroid injection into the right shoulder because of bursitis No headache or change in vision His chronic fatigue syndrome is stable  BP 139/90   Pulse 78   Resp 20   Ht 5\' 11"  (1.803 m)   Wt 240 lb (108.9 kg)   SpO2 98% Comment: RA  BMI 33.47 kg/m  Physical Exam      Exam    General- alert and comfortable    Neck-no bruit JVD or adenopathy   Lungs- clear without rales, wheezes   Cor- regular rate and rhythm, no murmur , gallop   Abdomen- soft, non-tender   Extremities - warm, non-tender, minimal edema   Neuro- oriented, appropriate, no focal weakness   Diagnostic Tests: Coronary angiogram clean CVP 3, PA pressures 33/18, wedge pressure 6, cardiac output 5.5 L/m PFTs adequate for sternotomy  Impression: 5.4 cm aortic root aneurysm with family history positive for aortic dissection Moderate aortic insufficiency with a bicuspid aortic valve Chronic fatigue syndrome Morbid obesity Obstructive sleep apnea Hypertension Depression  Plan: Patient will stop his anticoagulant and be scheduled for logic Bentall procedure on September 13 at [place]. Procedure indications benefits alternatives and risks have been discussed and all questions addressed.  Len Childs, MD [facility] [phone]

## 2017-06-19 ENCOUNTER — Encounter: Payer: Self-pay | Admitting: Internal Medicine

## 2017-06-19 DIAGNOSIS — G4733 Obstructive sleep apnea (adult) (pediatric): Secondary | ICD-10-CM | POA: Diagnosis not present

## 2017-06-29 NOTE — Pre-Procedure Instructions (Signed)
Grant [name] [facility]  06/29/2017      [id number] - [place], [place] [address] AT [place] [address] [place] [address] Phone: [phone] Fax: [phone]  [facility] [place], [address] AT [address] [place] [address] Phone: [phone] Fax: [phone]    Your procedure is scheduled on September 13  Report to [place] at [place].M.  Call this number if you have problems the morning of surgery:  [phone]   Remember:  Do not eat food or drink liquids after midnight.  Continue all other medications as directed by your physician except follow these medication instructions before surgery   Take these medicines the morning of surgery with A SIP OF WATER  albuterol (VENTOLIN HFA) 108 (90 Base) azelastine (ASTELIN)  budesonide-formoterol (SYMBICORT)  fexofenadine (ALLEGRA)  levothyroxine (SYNTHROID, LEVOTHROID) liothyronine (CYTOMEL)  metoprolol succinate (TOPROL-XL)  omeprazole (PRILOSEC) perphenazine (TRILAFON)  venlafaxine (EFFEXOR-XR)   7 days prior to surgery STOP taking any Aspirin, Aleve, Naproxen, Ibuprofen, Motrin, Advil, Goody's, BC's, all herbal medications, fish oil, and all vitamins  FOLLOW PHYSICIAN"S INSTRUCTIONS ABOUT XARELTO   Do not wear jewelry  Do not wear lotions, powders, or cologne, or deoderant.  Men may shave face and neck.  Do not bring valuables to the hospital.  [facility] is not responsible for any belongings or valuables.  Contacts, dentures or bridgework may not be worn into surgery.  Leave your suitcase in the car.  After surgery it may be brought to your room.  For patients admitted to the hospital, discharge time will be determined by your treatment team.  Patients discharged the day of surgery will not be allowed to drive home.    Special instructions:    [facility]- Preparing For Surgery  Before surgery, you can play an important role. Because skin is not sterile, your skin needs to be as free of germs as possible. You can reduce the number of germs on your skin by washing with CHG (chlorahexidine gluconate) Soap before surgery.  CHG is an antiseptic cleaner which kills germs and bonds with the skin to continue killing germs even after washing.  Please do not use if you have an allergy to CHG or antibacterial soaps. If your skin becomes reddened/irritated stop using the CHG.  Do not shave (including legs and underarms) for at least 48 hours prior to first CHG shower. It is OK to shave your face.  Please follow these instructions carefully.   1. Shower the NIGHT BEFORE SURGERY and the MORNING OF SURGERY with CHG.   2. If you chose to wash your hair, wash your hair first as usual with your normal shampoo.  3. After you shampoo, rinse your hair and body thoroughly to remove the shampoo.  4. Use CHG as you would any other liquid soap. You can apply CHG directly to the skin and wash gently with a scrungie or a clean washcloth.   5. Apply the CHG Soap to your body ONLY FROM THE NECK DOWN.  Do not use on open wounds or open sores. Avoid contact with your eyes, ears, mouth and genitals (private parts). Wash genitals (private parts) with your normal soap.  6. Wash thoroughly, paying special attention to the area where your surgery will be performed.  7. Thoroughly rinse your body with warm water  from the neck down.  8. DO NOT shower/wash with your normal soap after using and rinsing off the CHG Soap.  9. Pat yourself dry with a CLEAN TOWEL.   10. Wear CLEAN PAJAMAS   11. Place CLEAN SHEETS on your bed the night of your first shower and DO NOT SLEEP WITH PETS.    Day of Surgery: Do not apply any deodorants/lotions. Please wear clean clothes to the hospital/surgery center.      Please read over the following fact sheets that you were  given.

## 2017-06-30 ENCOUNTER — Ambulatory Visit (HOSPITAL_BASED_OUTPATIENT_CLINIC_OR_DEPARTMENT_OTHER)
Admission: RE | Admit: 2017-06-30 | Discharge: 2017-06-30 | Disposition: A | Discharge status: Home / Self Care | Payer: Medicare Other | Source: Ambulatory Visit | Attending: Cardiothoracic Surgery | Admitting: Cardiothoracic Surgery

## 2017-06-30 ENCOUNTER — Encounter (HOSPITAL_COMMUNITY)
Admission: RE | Admit: 2017-06-30 | Discharge: 2017-06-30 | Disposition: A | Discharge status: Home / Self Care | Payer: Medicare Other | Source: Ambulatory Visit | Attending: Cardiothoracic Surgery | Admitting: Cardiothoracic Surgery

## 2017-06-30 ENCOUNTER — Ambulatory Visit (HOSPITAL_COMMUNITY)
Admission: RE | Admit: 2017-06-30 | Discharge: 2017-06-30 | Disposition: A | Discharge status: Home / Self Care | Payer: Medicare Other | Source: Ambulatory Visit | Attending: Cardiothoracic Surgery | Admitting: Cardiothoracic Surgery

## 2017-06-30 ENCOUNTER — Encounter (HOSPITAL_COMMUNITY): Payer: Self-pay

## 2017-06-30 DIAGNOSIS — J449 Chronic obstructive pulmonary disease, unspecified: Secondary | ICD-10-CM | POA: Diagnosis not present

## 2017-06-30 DIAGNOSIS — I7121 Aneurysm of the ascending aorta, without rupture: Secondary | ICD-10-CM

## 2017-06-30 DIAGNOSIS — Z452 Encounter for adjustment and management of vascular access device: Secondary | ICD-10-CM | POA: Diagnosis not present

## 2017-06-30 DIAGNOSIS — F329 Major depressive disorder, single episode, unspecified: Secondary | ICD-10-CM | POA: Diagnosis present

## 2017-06-30 DIAGNOSIS — R5382 Chronic fatigue, unspecified: Secondary | ICD-10-CM | POA: Diagnosis not present

## 2017-06-30 DIAGNOSIS — R918 Other nonspecific abnormal finding of lung field: Secondary | ICD-10-CM | POA: Insufficient documentation

## 2017-06-30 DIAGNOSIS — I351 Nonrheumatic aortic (valve) insufficiency: Secondary | ICD-10-CM | POA: Diagnosis not present

## 2017-06-30 DIAGNOSIS — Z23 Encounter for immunization: Secondary | ICD-10-CM | POA: Diagnosis not present

## 2017-06-30 DIAGNOSIS — I482 Chronic atrial fibrillation: Secondary | ICD-10-CM | POA: Diagnosis not present

## 2017-06-30 DIAGNOSIS — I719 Aortic aneurysm of unspecified site, without rupture: Secondary | ICD-10-CM | POA: Diagnosis not present

## 2017-06-30 DIAGNOSIS — R0602 Shortness of breath: Secondary | ICD-10-CM | POA: Diagnosis not present

## 2017-06-30 DIAGNOSIS — F419 Anxiety disorder, unspecified: Secondary | ICD-10-CM | POA: Diagnosis not present

## 2017-06-30 DIAGNOSIS — J90 Pleural effusion, not elsewhere classified: Secondary | ICD-10-CM | POA: Diagnosis not present

## 2017-06-30 DIAGNOSIS — I517 Cardiomegaly: Secondary | ICD-10-CM

## 2017-06-30 DIAGNOSIS — J439 Emphysema, unspecified: Secondary | ICD-10-CM | POA: Diagnosis not present

## 2017-06-30 DIAGNOSIS — T797XXA Traumatic subcutaneous emphysema, initial encounter: Secondary | ICD-10-CM | POA: Diagnosis not present

## 2017-06-30 DIAGNOSIS — I459 Conduction disorder, unspecified: Secondary | ICD-10-CM | POA: Diagnosis not present

## 2017-06-30 DIAGNOSIS — K219 Gastro-esophageal reflux disease without esophagitis: Secondary | ICD-10-CM | POA: Diagnosis present

## 2017-06-30 DIAGNOSIS — R451 Restlessness and agitation: Secondary | ICD-10-CM | POA: Diagnosis not present

## 2017-06-30 DIAGNOSIS — G4733 Obstructive sleep apnea (adult) (pediatric): Secondary | ICD-10-CM | POA: Diagnosis not present

## 2017-06-30 DIAGNOSIS — R000 Tachycardia, unspecified: Secondary | ICD-10-CM | POA: Diagnosis not present

## 2017-06-30 DIAGNOSIS — G4700 Insomnia, unspecified: Secondary | ICD-10-CM | POA: Diagnosis not present

## 2017-06-30 DIAGNOSIS — J95811 Postprocedural pneumothorax: Secondary | ICD-10-CM | POA: Diagnosis not present

## 2017-06-30 DIAGNOSIS — J9811 Atelectasis: Secondary | ICD-10-CM | POA: Insufficient documentation

## 2017-06-30 DIAGNOSIS — I083 Combined rheumatic disorders of mitral, aortic and tricuspid valves: Secondary | ICD-10-CM | POA: Diagnosis not present

## 2017-06-30 DIAGNOSIS — J939 Pneumothorax, unspecified: Secondary | ICD-10-CM | POA: Diagnosis not present

## 2017-06-30 DIAGNOSIS — Q231 Congenital insufficiency of aortic valve: Secondary | ICD-10-CM | POA: Diagnosis not present

## 2017-06-30 DIAGNOSIS — Y838 Other surgical procedures as the cause of abnormal reaction of the patient, or of later complication, without mention of misadventure at the time of the procedure: Secondary | ICD-10-CM | POA: Diagnosis not present

## 2017-06-30 DIAGNOSIS — Z01818 Encounter for other preprocedural examination: Secondary | ICD-10-CM | POA: Insufficient documentation

## 2017-06-30 DIAGNOSIS — I480 Paroxysmal atrial fibrillation: Secondary | ICD-10-CM | POA: Diagnosis not present

## 2017-06-30 DIAGNOSIS — Z9119 Patient's noncompliance with other medical treatment and regimen: Secondary | ICD-10-CM | POA: Diagnosis not present

## 2017-06-30 DIAGNOSIS — M797 Fibromyalgia: Secondary | ICD-10-CM | POA: Diagnosis present

## 2017-06-30 DIAGNOSIS — Z4682 Encounter for fitting and adjustment of non-vascular catheter: Secondary | ICD-10-CM | POA: Diagnosis not present

## 2017-06-30 DIAGNOSIS — R4587 Impulsiveness: Secondary | ICD-10-CM | POA: Diagnosis not present

## 2017-06-30 DIAGNOSIS — F05 Delirium due to known physiological condition: Secondary | ICD-10-CM | POA: Diagnosis not present

## 2017-06-30 DIAGNOSIS — Z6839 Body mass index (BMI) 39.0-39.9, adult: Secondary | ICD-10-CM | POA: Diagnosis not present

## 2017-06-30 DIAGNOSIS — I10 Essential (primary) hypertension: Secondary | ICD-10-CM | POA: Diagnosis not present

## 2017-06-30 DIAGNOSIS — Z952 Presence of prosthetic heart valve: Secondary | ICD-10-CM | POA: Diagnosis not present

## 2017-06-30 DIAGNOSIS — I712 Thoracic aortic aneurysm, without rupture: Secondary | ICD-10-CM | POA: Diagnosis not present

## 2017-06-30 HISTORY — DX: Headache, unspecified: R51.9

## 2017-06-30 HISTORY — DX: Dyspnea, unspecified: R06.00

## 2017-06-30 HISTORY — DX: Unspecified asthma, uncomplicated: J45.909

## 2017-06-30 HISTORY — DX: Unspecified osteoarthritis, unspecified site: M19.90

## 2017-06-30 HISTORY — DX: Benign prostatic hyperplasia without lower urinary tract symptoms: N40.0

## 2017-06-30 HISTORY — DX: Headache: R51

## 2017-06-30 LAB — VAS US DOPPLER PRE CABG
LEFT ECA DIAS: -12 cm/s
LEFT VERTEBRAL DIAS: -11 cm/s
Left CCA dist dias: -13 cm/s
Left CCA dist sys: -59 cm/s
Left CCA prox dias: 10 cm/s
Left CCA prox sys: 89 cm/s
Left ICA dist dias: -21 cm/s
Left ICA dist sys: -64 cm/s
Left ICA prox dias: 13 cm/s
Left ICA prox sys: 62 cm/s
RIGHT ECA DIAS: -9 cm/s
RIGHT VERTEBRAL DIAS: -14 cm/s
Right CCA prox dias: 12 cm/s
Right CCA prox sys: 67 cm/s
Right cca dist sys: -69 cm/s

## 2017-06-30 LAB — COMPREHENSIVE METABOLIC PANEL
ALT: 24 U/L (ref 17–63)
AST: 26 U/L (ref 15–41)
Albumin: 3.8 g/dL (ref 3.5–5.0)
Alkaline Phosphatase: 47 U/L (ref 38–126)
Anion gap: 9 (ref 5–15)
BUN: 13 mg/dL (ref 6–20)
CO2: 21 mmol/L — ABNORMAL LOW (ref 22–32)
Calcium: 9.4 mg/dL (ref 8.9–10.3)
Chloride: 106 mmol/L (ref 101–111)
Creatinine, Ser: 0.89 mg/dL (ref 0.61–1.24)
GFR calc Af Amer: >60 mL/min (ref >60)
GFR calc non Af Amer: >60 mL/min (ref >60)
Glucose, Bld: 109 mg/dL — ABNORMAL HIGH (ref 65–99)
Potassium: 3.9 mmol/L (ref 3.5–5.1)
Sodium: 136 mmol/L (ref 135–145)
Total Bilirubin: 0.6 mg/dL (ref 0.3–1.2)
Total Protein: 6.2 g/dL — ABNORMAL LOW (ref 6.5–8.1)

## 2017-06-30 LAB — ABO/RH: ABO/RH(D): A POS

## 2017-06-30 LAB — HEMOGLOBIN A1C
Hgb A1c MFr Bld: 5.5 % (ref 4.8–5.6)
Mean Plasma Glucose: 111.15 mg/dL

## 2017-06-30 LAB — PROTIME-INR
INR: 1
Prothrombin Time: 13.1 seconds (ref 11.4–15.2)

## 2017-06-30 LAB — URINALYSIS, ROUTINE W REFLEX MICROSCOPIC
Bilirubin Urine: NEGATIVE
Glucose, UA: NEGATIVE mg/dL
Hgb urine dipstick: NEGATIVE
Ketones, ur: NEGATIVE mg/dL
Leukocytes, UA: NEGATIVE
Nitrite: NEGATIVE
Protein, ur: NEGATIVE mg/dL
Specific Gravity, Urine: 1.017 (ref 1.005–1.030)
pH: 6 (ref 5.0–8.0)

## 2017-06-30 LAB — SURGICAL PCR SCREEN
MRSA, PCR: NEGATIVE
Staphylococcus aureus: NEGATIVE

## 2017-06-30 LAB — CBC
HCT: 43.8 % (ref 39.0–52.0)
Hemoglobin: 14.8 g/dL (ref 13.0–17.0)
MCH: 30.7 pg (ref 26.0–34.0)
MCHC: 33.8 g/dL (ref 30.0–36.0)
MCV: 90.9 fL (ref 78.0–100.0)
Platelets: 170 10*3/uL (ref 150–400)
RBC: 4.82 MIL/uL (ref 4.22–5.81)
RDW: 13.2 % (ref 11.5–15.5)
WBC: 6.7 10*3/uL (ref 4.0–10.5)

## 2017-06-30 LAB — APTT: aPTT: 35 seconds (ref 24–36)

## 2017-06-30 NOTE — Progress Notes (Signed)
PCP: Dr. Jeanette Caprice [facility] Medicine: Dr. Adriana Simas Cardiologist: Dr. [name]  EKG: 06/12/17 CXR: Today ECHO: 05/08/17 Stress Test: 2016 Cardiac Cath: 05/2017 PFTs: 06/01/17 Dopplers: Today  Patient denies shortness of breath, fever, cough, and chest pain at PAT appointment.  Patient verbalized understanding of instructions provided today at the PAT appointment.  Patient asked to review instructions at home and day of surgery.   Pt and Wife instructed to bring CPAP mask to hospital DOS.  Pt reports due to chronic fatigue, wife, Juliann Pulse, needs to be present for all instructions.

## 2017-06-30 NOTE — Progress Notes (Signed)
Pre-op Cardiac Surgery  Carotid Findings:   Findings are consistent with a 1-39 percent stenosis involving the right internal carotid artery and the left internal carotid artery. The vertebral arteries demonstrate antegrade flow.  Upper Extremity Right Left  Brachial Pressures 164  Triphasic 166  Triphasic  Radial Waveforms Triphasic Triphasic  Ulnar Waveforms Triphasic Triphasic  Palmar Arch (Allen's Test) Palmar waveforms are diminished greater than fifty percent with radial compression and are obliterated with ulnar compression. Palmar waveforms remain within normal limits with radial compression and are diminished greater than fifty percent with ulnar compression.    06/30/17 9:53 AM Grant [name] RVT

## 2017-07-01 LAB — BLOOD GAS, ARTERIAL
Acid-Base Excess: 1.1 mmol/L (ref 0.0–2.0)
Bicarbonate: 24.6 mmol/L (ref 20.0–28.0)
Drawn by: 421801
FIO2: 0.21
O2 Saturation: 94.8 %
Patient temperature: 98.6
pCO2 arterial: 35.3 mmHg (ref 32.0–48.0)
pH, Arterial: 7.458 — ABNORMAL HIGH (ref 7.350–7.450)
pO2, Arterial: 87.1 mmHg (ref 83.0–108.0)

## 2017-07-01 MED ORDER — NITROGLYCERIN IN D5W 200-5 MCG/ML-% IV SOLN
2.0000 ug/min | INTRAVENOUS | Status: AC
  Administered 2017-07-02: 5 ug/min via INTRAVENOUS
  Filled 2017-07-01: qty 250

## 2017-07-01 MED ORDER — METOPROLOL TARTRATE 12.5 MG HALF TABLET
12.5000 mg | ORAL_TABLET | Freq: Once | ORAL | Status: AC
  Administered 2017-07-02: 12.5 mg via ORAL
  Filled 2017-07-01: qty 1

## 2017-07-01 MED ORDER — DEXTROSE 5 % IV SOLN
750.0000 mg | INTRAVENOUS | Status: DC
  Filled 2017-07-01: qty 750

## 2017-07-01 MED ORDER — DEXTROSE 5 % IV SOLN
1.5000 g | INTRAVENOUS | Status: AC
  Administered 2017-07-02: .75 g via INTRAVENOUS
  Administered 2017-07-02: 1.5 g via INTRAVENOUS
  Filled 2017-07-01 (×2): qty 1.5

## 2017-07-01 MED ORDER — DOPAMINE-DEXTROSE 3.2-5 MG/ML-% IV SOLN
0.0000 ug/kg/min | INTRAVENOUS | Status: AC
  Administered 2017-07-02: 2 ug/kg/min via INTRAVENOUS
  Filled 2017-07-01: qty 250

## 2017-07-01 MED ORDER — TRANEXAMIC ACID (OHS) BOLUS VIA INFUSION
15.0000 mg/kg | INTRAVENOUS | Status: AC
  Administered 2017-07-02: 1899 mg via INTRAVENOUS
  Filled 2017-07-01: qty 1899

## 2017-07-01 MED ORDER — TRANEXAMIC ACID 1000 MG/10ML IV SOLN
1.5000 mg/kg/h | INTRAVENOUS | Status: AC
  Administered 2017-07-02: 1.5 mg/kg/h via INTRAVENOUS
  Filled 2017-07-01 (×2): qty 25

## 2017-07-01 MED ORDER — TRANEXAMIC ACID (OHS) PUMP PRIME SOLUTION
2.0000 mg/kg | INTRAVENOUS | Status: DC
  Filled 2017-07-01: qty 2.53

## 2017-07-01 MED ORDER — MAGNESIUM SULFATE 50 % IJ SOLN
40.0000 meq | INTRAMUSCULAR | Status: DC
  Filled 2017-07-01: qty 10

## 2017-07-01 MED ORDER — VANCOMYCIN HCL 10 G IV SOLR
1500.0000 mg | INTRAVENOUS | Status: AC
  Administered 2017-07-02: 1500 mg via INTRAVENOUS
  Filled 2017-07-01: qty 1500

## 2017-07-01 MED ORDER — DEXTROSE 5 % IV SOLN
0.0000 ug/min | INTRAVENOUS | Status: DC
  Filled 2017-07-01: qty 4

## 2017-07-01 MED ORDER — POTASSIUM CHLORIDE 2 MEQ/ML IV SOLN
80.0000 meq | INTRAVENOUS | Status: DC
  Filled 2017-07-01: qty 40

## 2017-07-01 MED ORDER — PLASMA-LYTE 148 IV SOLN
INTRAVENOUS | Status: DC
  Filled 2017-07-01: qty 2.5

## 2017-07-01 MED ORDER — HEPARIN SODIUM (PORCINE) 1000 UNIT/ML IJ SOLN
INTRAMUSCULAR | Status: DC
  Filled 2017-07-01: qty 30

## 2017-07-01 MED ORDER — INSULIN REGULAR HUMAN 100 UNIT/ML IJ SOLN
INTRAMUSCULAR | Status: DC
  Filled 2017-07-01: qty 1

## 2017-07-01 MED ORDER — DEXMEDETOMIDINE HCL IN NACL 400 MCG/100ML IV SOLN
0.1000 ug/kg/h | INTRAVENOUS | Status: DC
  Filled 2017-07-01: qty 100

## 2017-07-01 MED ORDER — PHENYLEPHRINE HCL 10 MG/ML IJ SOLN
30.0000 ug/min | INTRAMUSCULAR | Status: AC
  Administered 2017-07-02: 50 ug/min via INTRAVENOUS
  Filled 2017-07-01: qty 2

## 2017-07-02 ENCOUNTER — Other Ambulatory Visit: Payer: Self-pay

## 2017-07-02 ENCOUNTER — Inpatient Hospital Stay (HOSPITAL_COMMUNITY)
Admission: RE | Disposition: A | Discharge status: Home Health | Payer: Self-pay | Source: Home / Self Care | Attending: Cardiothoracic Surgery

## 2017-07-02 ENCOUNTER — Inpatient Hospital Stay (HOSPITAL_COMMUNITY): Payer: Medicare Other

## 2017-07-02 ENCOUNTER — Inpatient Hospital Stay (HOSPITAL_COMMUNITY)
Admission: RE | Admit: 2017-07-02 | Discharge: 2017-07-12 | DRG: 220 | Disposition: A | Discharge status: Home Health | Payer: Medicare Other | Attending: Cardiothoracic Surgery | Admitting: Cardiothoracic Surgery

## 2017-07-02 ENCOUNTER — Encounter (HOSPITAL_COMMUNITY): Payer: Self-pay

## 2017-07-02 ENCOUNTER — Inpatient Hospital Stay (HOSPITAL_COMMUNITY): Payer: Medicare Other | Admitting: Anesthesiology

## 2017-07-02 DIAGNOSIS — R000 Tachycardia, unspecified: Secondary | ICD-10-CM | POA: Diagnosis not present

## 2017-07-02 DIAGNOSIS — J90 Pleural effusion, not elsewhere classified: Secondary | ICD-10-CM | POA: Diagnosis not present

## 2017-07-02 DIAGNOSIS — Y838 Other surgical procedures as the cause of abnormal reaction of the patient, or of later complication, without mention of misadventure at the time of the procedure: Secondary | ICD-10-CM | POA: Diagnosis not present

## 2017-07-02 DIAGNOSIS — I351 Nonrheumatic aortic (valve) insufficiency: Secondary | ICD-10-CM | POA: Diagnosis not present

## 2017-07-02 DIAGNOSIS — J439 Emphysema, unspecified: Secondary | ICD-10-CM | POA: Diagnosis not present

## 2017-07-02 DIAGNOSIS — F419 Anxiety disorder, unspecified: Secondary | ICD-10-CM | POA: Diagnosis present

## 2017-07-02 DIAGNOSIS — T797XXA Traumatic subcutaneous emphysema, initial encounter: Secondary | ICD-10-CM | POA: Diagnosis not present

## 2017-07-02 DIAGNOSIS — Z952 Presence of prosthetic heart valve: Secondary | ICD-10-CM | POA: Diagnosis not present

## 2017-07-02 DIAGNOSIS — J9811 Atelectasis: Secondary | ICD-10-CM | POA: Diagnosis not present

## 2017-07-02 DIAGNOSIS — I482 Chronic atrial fibrillation: Secondary | ICD-10-CM | POA: Diagnosis present

## 2017-07-02 DIAGNOSIS — Z87891 Personal history of nicotine dependence: Secondary | ICD-10-CM

## 2017-07-02 DIAGNOSIS — Z23 Encounter for immunization: Secondary | ICD-10-CM

## 2017-07-02 DIAGNOSIS — I10 Essential (primary) hypertension: Secondary | ICD-10-CM | POA: Diagnosis present

## 2017-07-02 DIAGNOSIS — I712 Thoracic aortic aneurysm, without rupture: Principal | ICD-10-CM | POA: Diagnosis present

## 2017-07-02 DIAGNOSIS — I083 Combined rheumatic disorders of mitral, aortic and tricuspid valves: Secondary | ICD-10-CM | POA: Diagnosis not present

## 2017-07-02 DIAGNOSIS — F329 Major depressive disorder, single episode, unspecified: Secondary | ICD-10-CM | POA: Diagnosis present

## 2017-07-02 DIAGNOSIS — K219 Gastro-esophageal reflux disease without esophagitis: Secondary | ICD-10-CM | POA: Diagnosis present

## 2017-07-02 DIAGNOSIS — R5382 Chronic fatigue, unspecified: Secondary | ICD-10-CM | POA: Diagnosis present

## 2017-07-02 DIAGNOSIS — Z4682 Encounter for fitting and adjustment of non-vascular catheter: Secondary | ICD-10-CM | POA: Diagnosis not present

## 2017-07-02 DIAGNOSIS — Z7901 Long term (current) use of anticoagulants: Secondary | ICD-10-CM

## 2017-07-02 DIAGNOSIS — Z9119 Patient's noncompliance with other medical treatment and regimen: Secondary | ICD-10-CM

## 2017-07-02 DIAGNOSIS — R451 Restlessness and agitation: Secondary | ICD-10-CM | POA: Diagnosis not present

## 2017-07-02 DIAGNOSIS — Q231 Congenital insufficiency of aortic valve: Secondary | ICD-10-CM | POA: Diagnosis not present

## 2017-07-02 DIAGNOSIS — J449 Chronic obstructive pulmonary disease, unspecified: Secondary | ICD-10-CM | POA: Diagnosis present

## 2017-07-02 DIAGNOSIS — Z452 Encounter for adjustment and management of vascular access device: Secondary | ICD-10-CM | POA: Diagnosis not present

## 2017-07-02 DIAGNOSIS — Z9689 Presence of other specified functional implants: Secondary | ICD-10-CM

## 2017-07-02 DIAGNOSIS — R4587 Impulsiveness: Secondary | ICD-10-CM | POA: Diagnosis not present

## 2017-07-02 DIAGNOSIS — J939 Pneumothorax, unspecified: Secondary | ICD-10-CM | POA: Diagnosis not present

## 2017-07-02 DIAGNOSIS — F05 Delirium due to known physiological condition: Secondary | ICD-10-CM | POA: Diagnosis not present

## 2017-07-02 DIAGNOSIS — R0602 Shortness of breath: Secondary | ICD-10-CM | POA: Diagnosis not present

## 2017-07-02 DIAGNOSIS — M797 Fibromyalgia: Secondary | ICD-10-CM | POA: Diagnosis present

## 2017-07-02 DIAGNOSIS — I480 Paroxysmal atrial fibrillation: Secondary | ICD-10-CM | POA: Diagnosis present

## 2017-07-02 DIAGNOSIS — Z419 Encounter for procedure for purposes other than remedying health state, unspecified: Secondary | ICD-10-CM

## 2017-07-02 DIAGNOSIS — Z6839 Body mass index (BMI) 39.0-39.9, adult: Secondary | ICD-10-CM

## 2017-07-02 DIAGNOSIS — G4700 Insomnia, unspecified: Secondary | ICD-10-CM | POA: Diagnosis not present

## 2017-07-02 DIAGNOSIS — G4733 Obstructive sleep apnea (adult) (pediatric): Secondary | ICD-10-CM | POA: Diagnosis present

## 2017-07-02 DIAGNOSIS — J95811 Postprocedural pneumothorax: Secondary | ICD-10-CM | POA: Diagnosis not present

## 2017-07-02 DIAGNOSIS — I719 Aortic aneurysm of unspecified site, without rupture: Secondary | ICD-10-CM

## 2017-07-02 DIAGNOSIS — Z7951 Long term (current) use of inhaled steroids: Secondary | ICD-10-CM

## 2017-07-02 DIAGNOSIS — Z8249 Family history of ischemic heart disease and other diseases of the circulatory system: Secondary | ICD-10-CM

## 2017-07-02 DIAGNOSIS — I459 Conduction disorder, unspecified: Secondary | ICD-10-CM | POA: Diagnosis present

## 2017-07-02 DIAGNOSIS — Z885 Allergy status to narcotic agent status: Secondary | ICD-10-CM

## 2017-07-02 DIAGNOSIS — I7121 Aneurysm of the ascending aorta, without rupture: Secondary | ICD-10-CM

## 2017-07-02 DIAGNOSIS — Z79899 Other long term (current) drug therapy: Secondary | ICD-10-CM

## 2017-07-02 DIAGNOSIS — Z09 Encounter for follow-up examination after completed treatment for conditions other than malignant neoplasm: Secondary | ICD-10-CM

## 2017-07-02 DIAGNOSIS — Z9889 Other specified postprocedural states: Secondary | ICD-10-CM

## 2017-07-02 DIAGNOSIS — E039 Hypothyroidism, unspecified: Secondary | ICD-10-CM | POA: Diagnosis present

## 2017-07-02 HISTORY — PX: TEE WITHOUT CARDIOVERSION: SHX5443

## 2017-07-02 HISTORY — PX: BENTALL PROCEDURE: SHX5058

## 2017-07-02 LAB — POCT I-STAT, CHEM 8
BUN: 12 mg/dL (ref 6–20)
BUN: 12 mg/dL (ref 6–20)
BUN: 12 mg/dL (ref 6–20)
BUN: 13 mg/dL (ref 6–20)
BUN: 12 mg/dL (ref 6–20)
BUN: 13 mg/dL (ref 6–20)
BUN: 14 mg/dL (ref 6–20)
BUN: 12 mg/dL (ref 6–20)
Calcium, Ion: 1.21 mmol/L (ref 1.15–1.40)
Calcium, Ion: 1.2 mmol/L (ref 1.15–1.40)
Calcium, Ion: 1.08 mmol/L — ABNORMAL LOW (ref 1.15–1.40)
Calcium, Ion: 1.12 mmol/L — ABNORMAL LOW (ref 1.15–1.40)
Calcium, Ion: 1.01 mmol/L — ABNORMAL LOW (ref 1.15–1.40)
Calcium, Ion: 0.99 mmol/L — ABNORMAL LOW (ref 1.15–1.40)
Calcium, Ion: 1.01 mmol/L — ABNORMAL LOW (ref 1.15–1.40)
Calcium, Ion: 1.11 mmol/L — ABNORMAL LOW (ref 1.15–1.40)
Chloride: 102 mmol/L (ref 101–111)
Chloride: 102 mmol/L (ref 101–111)
Chloride: 101 mmol/L (ref 101–111)
Chloride: 102 mmol/L (ref 101–111)
Chloride: 102 mmol/L (ref 101–111)
Chloride: 102 mmol/L (ref 101–111)
Chloride: 103 mmol/L (ref 101–111)
Chloride: 102 mmol/L (ref 101–111)
Creatinine, Ser: 0.8 mg/dL (ref 0.61–1.24)
Creatinine, Ser: 0.8 mg/dL (ref 0.61–1.24)
Creatinine, Ser: 0.8 mg/dL (ref 0.61–1.24)
Creatinine, Ser: 0.8 mg/dL (ref 0.61–1.24)
Creatinine, Ser: 0.9 mg/dL (ref 0.61–1.24)
Creatinine, Ser: 0.8 mg/dL (ref 0.61–1.24)
Creatinine, Ser: 0.8 mg/dL (ref 0.61–1.24)
Creatinine, Ser: 0.7 mg/dL (ref 0.61–1.24)
Glucose, Bld: 100 mg/dL — ABNORMAL HIGH (ref 65–99)
Glucose, Bld: 118 mg/dL — ABNORMAL HIGH (ref 65–99)
Glucose, Bld: 117 mg/dL — ABNORMAL HIGH (ref 65–99)
Glucose, Bld: 134 mg/dL — ABNORMAL HIGH (ref 65–99)
Glucose, Bld: 134 mg/dL — ABNORMAL HIGH (ref 65–99)
Glucose, Bld: 223 mg/dL — ABNORMAL HIGH (ref 65–99)
Glucose, Bld: 231 mg/dL — ABNORMAL HIGH (ref 65–99)
Glucose, Bld: 229 mg/dL — ABNORMAL HIGH (ref 65–99)
HCT: 38 % — ABNORMAL LOW (ref 39.0–52.0)
HCT: 35 % — ABNORMAL LOW (ref 39.0–52.0)
HCT: 29 % — ABNORMAL LOW (ref 39.0–52.0)
HCT: 33 % — ABNORMAL LOW (ref 39.0–52.0)
HCT: 33 % — ABNORMAL LOW (ref 39.0–52.0)
HCT: 33 % — ABNORMAL LOW (ref 39.0–52.0)
HCT: 34 % — ABNORMAL LOW (ref 39.0–52.0)
HCT: 34 % — ABNORMAL LOW (ref 39.0–52.0)
Hemoglobin: 12.9 g/dL — ABNORMAL LOW (ref 13.0–17.0)
Hemoglobin: 11.9 g/dL — ABNORMAL LOW (ref 13.0–17.0)
Hemoglobin: 9.9 g/dL — ABNORMAL LOW (ref 13.0–17.0)
Hemoglobin: 11.2 g/dL — ABNORMAL LOW (ref 13.0–17.0)
Hemoglobin: 11.2 g/dL — ABNORMAL LOW (ref 13.0–17.0)
Hemoglobin: 11.2 g/dL — ABNORMAL LOW (ref 13.0–17.0)
Hemoglobin: 11.6 g/dL — ABNORMAL LOW (ref 13.0–17.0)
Hemoglobin: 11.6 g/dL — ABNORMAL LOW (ref 13.0–17.0)
Potassium: 3.8 mmol/L (ref 3.5–5.1)
Potassium: 4.1 mmol/L (ref 3.5–5.1)
Potassium: 4 mmol/L (ref 3.5–5.1)
Potassium: 4.3 mmol/L (ref 3.5–5.1)
Potassium: 4.7 mmol/L (ref 3.5–5.1)
Potassium: 3.7 mmol/L (ref 3.5–5.1)
Potassium: 3.8 mmol/L (ref 3.5–5.1)
Potassium: 3.7 mmol/L (ref 3.5–5.1)
Sodium: 140 mmol/L (ref 135–145)
Sodium: 140 mmol/L (ref 135–145)
Sodium: 140 mmol/L (ref 135–145)
Sodium: 139 mmol/L (ref 135–145)
Sodium: 136 mmol/L (ref 135–145)
Sodium: 137 mmol/L (ref 135–145)
Sodium: 138 mmol/L (ref 135–145)
Sodium: 141 mmol/L (ref 135–145)
TCO2: 26 mmol/L (ref 22–32)
TCO2: 27 mmol/L (ref 22–32)
TCO2: 29 mmol/L (ref 22–32)
TCO2: 28 mmol/L (ref 22–32)
TCO2: 28 mmol/L (ref 22–32)
TCO2: 23 mmol/L (ref 22–32)
TCO2: 24 mmol/L (ref 22–32)
TCO2: 23 mmol/L (ref 22–32)

## 2017-07-02 LAB — POCT I-STAT 3, ART BLOOD GAS (G3+)
Acid-Base Excess: 5 mmol/L — ABNORMAL HIGH (ref 0.0–2.0)
Acid-base deficit: 1 mmol/L (ref 0.0–2.0)
Acid-base deficit: 1 mmol/L (ref 0.0–2.0)
Acid-base deficit: 1 mmol/L (ref 0.0–2.0)
Bicarbonate: 31.6 mmol/L — ABNORMAL HIGH (ref 20.0–28.0)
Bicarbonate: 22.9 mmol/L (ref 20.0–28.0)
Bicarbonate: 24.9 mmol/L (ref 20.0–28.0)
Bicarbonate: 24.7 mmol/L (ref 20.0–28.0)
O2 Saturation: 100 %
O2 Saturation: 100 %
O2 Saturation: 97 %
O2 Saturation: 97 %
Patient temperature: 35.5
TCO2: 33 mmol/L — ABNORMAL HIGH (ref 22–32)
TCO2: 24 mmol/L (ref 22–32)
TCO2: 26 mmol/L (ref 22–32)
TCO2: 26 mmol/L (ref 22–32)
pCO2 arterial: 56.3 mmHg — ABNORMAL HIGH (ref 32.0–48.0)
pCO2 arterial: 33.5 mmHg (ref 32.0–48.0)
pCO2 arterial: 47.2 mmHg (ref 32.0–48.0)
pCO2 arterial: 42.2 mmHg (ref 32.0–48.0)
pH, Arterial: 7.358 (ref 7.350–7.450)
pH, Arterial: 7.443 (ref 7.350–7.450)
pH, Arterial: 7.33 — ABNORMAL LOW (ref 7.350–7.450)
pH, Arterial: 7.368 (ref 7.350–7.450)
pO2, Arterial: 404 mmHg — ABNORMAL HIGH (ref 83.0–108.0)
pO2, Arterial: 354 mmHg — ABNORMAL HIGH (ref 83.0–108.0)
pO2, Arterial: 95 mmHg (ref 83.0–108.0)
pO2, Arterial: 84 mmHg (ref 83.0–108.0)

## 2017-07-02 LAB — CREATININE, SERUM
Creatinine, Ser: 1 mg/dL (ref 0.61–1.24)
GFR calc Af Amer: >60 mL/min (ref >60)
GFR calc non Af Amer: >60 mL/min (ref >60)

## 2017-07-02 LAB — GLUCOSE, CAPILLARY
Glucose-Capillary: 98 mg/dL (ref 65–99)
Glucose-Capillary: 199 mg/dL — ABNORMAL HIGH (ref 65–99)
Glucose-Capillary: 189 mg/dL — ABNORMAL HIGH (ref 65–99)
Glucose-Capillary: 189 mg/dL — ABNORMAL HIGH (ref 65–99)

## 2017-07-02 LAB — POCT I-STAT 4, (NA,K, GLUC, HGB,HCT)
Glucose, Bld: 205 mg/dL — ABNORMAL HIGH (ref 65–99)
HCT: 35 % — ABNORMAL LOW (ref 39.0–52.0)
Hemoglobin: 11.9 g/dL — ABNORMAL LOW (ref 13.0–17.0)
Potassium: 3.9 mmol/L (ref 3.5–5.1)
Sodium: 141 mmol/L (ref 135–145)

## 2017-07-02 LAB — APTT: aPTT: 29 seconds (ref 24–36)

## 2017-07-02 LAB — CBC
HCT: 37 % — ABNORMAL LOW (ref 39.0–52.0)
HCT: 34 % — ABNORMAL LOW (ref 39.0–52.0)
Hemoglobin: 12.6 g/dL — ABNORMAL LOW (ref 13.0–17.0)
Hemoglobin: 11.5 g/dL — ABNORMAL LOW (ref 13.0–17.0)
MCH: 30.7 pg (ref 26.0–34.0)
MCH: 30.2 pg (ref 26.0–34.0)
MCHC: 34.1 g/dL (ref 30.0–36.0)
MCHC: 33.8 g/dL (ref 30.0–36.0)
MCV: 90.2 fL (ref 78.0–100.0)
MCV: 89.2 fL (ref 78.0–100.0)
Platelets: 145 10*3/uL — ABNORMAL LOW (ref 150–400)
Platelets: 134 10*3/uL — ABNORMAL LOW (ref 150–400)
RBC: 4.1 MIL/uL — ABNORMAL LOW (ref 4.22–5.81)
RBC: 3.81 MIL/uL — ABNORMAL LOW (ref 4.22–5.81)
RDW: 13.2 % (ref 11.5–15.5)
RDW: 12.9 % (ref 11.5–15.5)
WBC: 13.3 10*3/uL — ABNORMAL HIGH (ref 4.0–10.5)
WBC: 14.4 10*3/uL — ABNORMAL HIGH (ref 4.0–10.5)

## 2017-07-02 LAB — PROTIME-INR
INR: 1.36
Prothrombin Time: 16.6 seconds — ABNORMAL HIGH (ref 11.4–15.2)

## 2017-07-02 LAB — PREPARE RBC (CROSSMATCH)

## 2017-07-02 LAB — MAGNESIUM: Magnesium: 2.3 mg/dL (ref 1.7–2.4)

## 2017-07-02 LAB — HEMOGLOBIN AND HEMATOCRIT, BLOOD
HCT: 35.9 % — ABNORMAL LOW (ref 39.0–52.0)
Hemoglobin: 12.4 g/dL — ABNORMAL LOW (ref 13.0–17.0)

## 2017-07-02 LAB — PLATELET COUNT: Platelets: 123 10*3/uL — ABNORMAL LOW (ref 150–400)

## 2017-07-02 SURGERY — BENTALL PROCEDURE
Anesthesia: General | Site: Chest

## 2017-07-02 MED ORDER — SODIUM CHLORIDE 0.9 % IJ SOLN
OROMUCOSAL | Status: DC | PRN
  Administered 2017-07-02 (×5): 4 mL via TOPICAL

## 2017-07-02 MED ORDER — CHLORHEXIDINE GLUCONATE 0.12 % MT SOLN
15.0000 mL | OROMUCOSAL | Status: AC
  Administered 2017-07-02: 15 mL via OROMUCOSAL

## 2017-07-02 MED ORDER — LACTATED RINGERS IV SOLN
INTRAVENOUS | Status: DC | PRN
  Administered 2017-07-02 (×3): via INTRAVENOUS

## 2017-07-02 MED ORDER — ROCURONIUM BROMIDE 10 MG/ML (PF) SYRINGE
PREFILLED_SYRINGE | INTRAVENOUS | Status: AC
  Filled 2017-07-02: qty 15

## 2017-07-02 MED ORDER — METHYLPREDNISOLONE SODIUM SUCC 125 MG IJ SOLR
INTRAMUSCULAR | Status: DC | PRN
  Administered 2017-07-02: 250 mg via INTRAVENOUS

## 2017-07-02 MED ORDER — MIDAZOLAM HCL 10 MG/2ML IJ SOLN
INTRAMUSCULAR | Status: AC
  Filled 2017-07-02: qty 2

## 2017-07-02 MED ORDER — THIAMINE HCL 100 MG/ML IJ SOLN
100.0000 mg | Freq: Every day | INTRAMUSCULAR | Status: DC
  Administered 2017-07-03 – 2017-07-09 (×7): 100 mg via INTRAVENOUS
  Filled 2017-07-02 (×7): qty 2

## 2017-07-02 MED ORDER — ACETAMINOPHEN 650 MG RE SUPP
650.0000 mg | Freq: Once | RECTAL | Status: AC
  Administered 2017-07-02: 650 mg via RECTAL

## 2017-07-02 MED ORDER — AMIODARONE HCL 200 MG PO TABS
200.0000 mg | ORAL_TABLET | Freq: Two times a day (BID) | ORAL | Status: DC
  Administered 2017-07-03: 200 mg via ORAL
  Filled 2017-07-02: qty 1

## 2017-07-02 MED ORDER — FINASTERIDE 5 MG PO TABS
5.0000 mg | ORAL_TABLET | Freq: Every day | ORAL | Status: DC
  Administered 2017-07-03 – 2017-07-11 (×9): 5 mg via ORAL
  Filled 2017-07-02 (×9): qty 1

## 2017-07-02 MED ORDER — MORPHINE SULFATE (PF) 4 MG/ML IV SOLN
1.0000 mg | INTRAVENOUS | Status: AC | PRN
  Administered 2017-07-03: 4 mg via INTRAVENOUS
  Filled 2017-07-02: qty 1

## 2017-07-02 MED ORDER — VASOPRESSIN 20 UNIT/ML IV SOLN
0.0100 [IU]/min | Freq: Once | INTRAVENOUS | Status: AC
  Administered 2017-07-02: 0.03 [IU]/min via INTRAVENOUS
  Filled 2017-07-02: qty 2

## 2017-07-02 MED ORDER — PHENYLEPHRINE HCL 10 MG/ML IJ SOLN
0.0000 ug/min | INTRAMUSCULAR | Status: DC
  Filled 2017-07-02: qty 2

## 2017-07-02 MED ORDER — LACTATED RINGERS IV SOLN
INTRAVENOUS | Status: DC | PRN
  Administered 2017-07-02 (×2): via INTRAVENOUS

## 2017-07-02 MED ORDER — ACETAMINOPHEN 160 MG/5ML PO SOLN: 650.0000 mg | Freq: Once | ORAL | Status: AC

## 2017-07-02 MED ORDER — LACTATED RINGERS IV SOLN
INTRAVENOUS | Status: DC
  Administered 2017-07-05: 14:00:00 via INTRAVENOUS

## 2017-07-02 MED ORDER — SODIUM CHLORIDE 0.9 % IV SOLN
INTRAVENOUS | Status: DC | PRN
  Administered 2017-07-02: .8 [IU]/h via INTRAVENOUS

## 2017-07-02 MED ORDER — INSULIN REGULAR BOLUS VIA INFUSION
0.0000 [IU] | Freq: Three times a day (TID) | INTRAVENOUS | Status: DC
  Filled 2017-07-02: qty 10

## 2017-07-02 MED ORDER — HEMOSTATIC AGENTS (NO CHARGE) OPTIME
TOPICAL | Status: DC | PRN
  Administered 2017-07-02 (×6): 1 via TOPICAL

## 2017-07-02 MED ORDER — FENTANYL CITRATE (PF) 250 MCG/5ML IJ SOLN
INTRAMUSCULAR | Status: AC
  Filled 2017-07-02: qty 30

## 2017-07-02 MED ORDER — COLCHICINE 0.6 MG PO TABS
0.6000 mg | ORAL_TABLET | Freq: Every day | ORAL | Status: DC
Start: 2017-07-03 — End: 2017-07-12
  Administered 2017-07-03 – 2017-07-12 (×10): 0.6 mg via ORAL
  Filled 2017-07-02 (×10): qty 1

## 2017-07-02 MED ORDER — BISACODYL 10 MG RE SUPP: 10.0000 mg | Freq: Every day | RECTAL | Status: DC

## 2017-07-02 MED ORDER — MIDAZOLAM HCL 2 MG/2ML IJ SOLN
2.0000 mg | INTRAMUSCULAR | Status: DC | PRN
  Administered 2017-07-02: 2 mg via INTRAVENOUS
  Filled 2017-07-02: qty 2

## 2017-07-02 MED ORDER — SODIUM CHLORIDE 0.9 % IV SOLN: INTRAVENOUS | Status: DC

## 2017-07-02 MED ORDER — NOREPINEPHRINE BITARTRATE 1 MG/ML IV SOLN
2.0000 ug/min | INTRAVENOUS | Status: DC
  Filled 2017-07-02: qty 4

## 2017-07-02 MED ORDER — METOPROLOL TARTRATE 5 MG/5ML IV SOLN
2.5000 mg | INTRAVENOUS | Status: DC | PRN
  Filled 2017-07-02: qty 5

## 2017-07-02 MED ORDER — PROPOFOL 10 MG/ML IV BOLUS
INTRAVENOUS | Status: AC
  Filled 2017-07-02: qty 40

## 2017-07-02 MED ORDER — METOCLOPRAMIDE HCL 5 MG/ML IJ SOLN
10.0000 mg | Freq: Four times a day (QID) | INTRAMUSCULAR | Status: AC
  Administered 2017-07-02 – 2017-07-07 (×20): 10 mg via INTRAVENOUS
  Filled 2017-07-02 (×18): qty 2

## 2017-07-02 MED ORDER — ACETAMINOPHEN 160 MG/5ML PO SOLN
1000.0000 mg | Freq: Four times a day (QID) | ORAL | Status: AC
  Administered 2017-07-03: 1000 mg
  Filled 2017-07-02: qty 40.6

## 2017-07-02 MED ORDER — LIDOCAINE HCL (CARDIAC) 20 MG/ML IV SOLN
INTRAVENOUS | Status: DC | PRN
  Administered 2017-07-02: 60 mg via INTRAVENOUS

## 2017-07-02 MED ORDER — SODIUM CHLORIDE 0.9 % IV SOLN
0.0500 [IU]/min | INTRAVENOUS | Status: DC
  Filled 2017-07-02: qty 2

## 2017-07-02 MED ORDER — FENTANYL CITRATE (PF) 250 MCG/5ML IJ SOLN
INTRAMUSCULAR | Status: DC | PRN
  Administered 2017-07-02: 150 ug via INTRAVENOUS
  Administered 2017-07-02: 100 ug via INTRAVENOUS
  Administered 2017-07-02 (×2): 250 ug via INTRAVENOUS
  Administered 2017-07-02: 50 ug via INTRAVENOUS
  Administered 2017-07-02 (×4): 250 ug via INTRAVENOUS
  Administered 2017-07-02: 200 ug via INTRAVENOUS

## 2017-07-02 MED ORDER — ROCURONIUM BROMIDE 100 MG/10ML IV SOLN
INTRAVENOUS | Status: DC | PRN
  Administered 2017-07-02: 50 mg via INTRAVENOUS
  Administered 2017-07-02: 60 mg via INTRAVENOUS
  Administered 2017-07-02: 40 mg via INTRAVENOUS
  Administered 2017-07-02 (×2): 50 mg via INTRAVENOUS

## 2017-07-02 MED ORDER — METOPROLOL TARTRATE 25 MG/10 ML ORAL SUSPENSION: 12.5000 mg | Freq: Two times a day (BID) | ORAL | Status: DC

## 2017-07-02 MED ORDER — TESTOSTERONE CYPIONATE 100 MG/ML IM SOLN: 200.0000 mg | INTRAMUSCULAR | Status: DC

## 2017-07-02 MED ORDER — SODIUM CHLORIDE 0.9% FLUSH: 3.0000 mL | INTRAVENOUS | Status: DC | PRN

## 2017-07-02 MED ORDER — SODIUM CHLORIDE 0.9 % IV SOLN
0.0000 ug/kg/h | INTRAVENOUS | Status: DC
  Administered 2017-07-02: 0.5 ug/kg/h via INTRAVENOUS

## 2017-07-02 MED ORDER — SODIUM CHLORIDE 0.9 % IV SOLN
20.0000 ug | Freq: Once | INTRAVENOUS | Status: AC
  Administered 2017-07-02: 20 ug via INTRAVENOUS
  Filled 2017-07-02: qty 5

## 2017-07-02 MED ORDER — LACTATED RINGERS IV SOLN: INTRAVENOUS | Status: DC

## 2017-07-02 MED ORDER — SUCCINYLCHOLINE CHLORIDE 200 MG/10ML IV SOSY
PREFILLED_SYRINGE | INTRAVENOUS | Status: AC
  Filled 2017-07-02: qty 10

## 2017-07-02 MED ORDER — PANTOPRAZOLE SODIUM 40 MG PO TBEC
40.0000 mg | DELAYED_RELEASE_TABLET | Freq: Every day | ORAL | Status: DC
  Administered 2017-07-04 – 2017-07-12 (×9): 40 mg via ORAL
  Filled 2017-07-02 (×9): qty 1

## 2017-07-02 MED ORDER — PROPOFOL 10 MG/ML IV BOLUS
INTRAVENOUS | Status: DC | PRN
  Administered 2017-07-02: 150 mg via INTRAVENOUS

## 2017-07-02 MED ORDER — SODIUM CHLORIDE 0.9 % IV SOLN
250.0000 mL | INTRAVENOUS | Status: DC
  Administered 2017-07-03: 250 mL via INTRAVENOUS

## 2017-07-02 MED ORDER — ASPIRIN EC 325 MG PO TBEC
325.0000 mg | DELAYED_RELEASE_TABLET | Freq: Every day | ORAL | Status: DC
  Administered 2017-07-03 – 2017-07-10 (×7): 325 mg via ORAL
  Filled 2017-07-02 (×8): qty 1

## 2017-07-02 MED ORDER — VENLAFAXINE HCL ER 75 MG PO CP24
150.0000 mg | ORAL_CAPSULE | Freq: Every morning | ORAL | Status: DC
  Administered 2017-07-03 – 2017-07-12 (×10): 150 mg via ORAL
  Filled 2017-07-02 (×2): qty 1
  Filled 2017-07-02: qty 2
  Filled 2017-07-02 (×2): qty 1
  Filled 2017-07-02 (×2): qty 2
  Filled 2017-07-02: qty 1
  Filled 2017-07-02: qty 2
  Filled 2017-07-02: qty 1

## 2017-07-02 MED ORDER — ACETAMINOPHEN 500 MG PO TABS
1000.0000 mg | ORAL_TABLET | Freq: Four times a day (QID) | ORAL | Status: AC
  Administered 2017-07-03 – 2017-07-07 (×16): 1000 mg via ORAL
  Filled 2017-07-02 (×17): qty 2

## 2017-07-02 MED ORDER — SODIUM CHLORIDE 0.9 % IV SOLN
INTRAVENOUS | Status: DC | PRN
  Administered 2017-07-02: 0.2 ug/kg/h via INTRAVENOUS

## 2017-07-02 MED ORDER — PHENYLEPHRINE HCL 10 MG/ML IJ SOLN
INTRAVENOUS | Status: DC | PRN
  Administered 2017-07-02: 40 ug/min via INTRAVENOUS

## 2017-07-02 MED ORDER — POTASSIUM CHLORIDE 10 MEQ/50ML IV SOLN
10.0000 meq | INTRAVENOUS | Status: AC
  Administered 2017-07-02 (×2): 10 meq via INTRAVENOUS

## 2017-07-02 MED ORDER — ONDANSETRON HCL 4 MG/2ML IJ SOLN: 4.0000 mg | Freq: Four times a day (QID) | INTRAMUSCULAR | Status: DC | PRN

## 2017-07-02 MED ORDER — ASPIRIN 81 MG PO CHEW
324.0000 mg | CHEWABLE_TABLET | Freq: Every day | ORAL | Status: DC
  Administered 2017-07-09: 324 mg
  Filled 2017-07-02: qty 4

## 2017-07-02 MED ORDER — CHLORHEXIDINE GLUCONATE 4 % EX LIQD: 30.0000 mL | CUTANEOUS | Status: DC

## 2017-07-02 MED ORDER — LACTATED RINGERS IV SOLN
INTRAVENOUS | Status: DC | PRN
  Administered 2017-07-02 (×2): via INTRAVENOUS

## 2017-07-02 MED ORDER — ORAL CARE MOUTH RINSE
15.0000 mL | Freq: Four times a day (QID) | OROMUCOSAL | Status: DC
  Administered 2017-07-03 (×2): 15 mL via OROMUCOSAL

## 2017-07-02 MED ORDER — LACTATED RINGERS IV SOLN
500.0000 mL | Freq: Once | INTRAVENOUS | Status: DC | PRN
Start: 2017-07-02 — End: 2017-07-02

## 2017-07-02 MED ORDER — TAMSULOSIN HCL 0.4 MG PO CAPS
0.4000 mg | ORAL_CAPSULE | Freq: Every day | ORAL | Status: DC
  Administered 2017-07-03 – 2017-07-11 (×9): 0.4 mg via ORAL
  Filled 2017-07-02 (×9): qty 1

## 2017-07-02 MED ORDER — MIDAZOLAM HCL 5 MG/5ML IJ SOLN
INTRAMUSCULAR | Status: DC | PRN
  Administered 2017-07-02: 3 mg via INTRAVENOUS
  Administered 2017-07-02: 2 mg via INTRAVENOUS
  Administered 2017-07-02: 1 mg via INTRAVENOUS
  Administered 2017-07-02: 4 mg via INTRAVENOUS

## 2017-07-02 MED ORDER — VANCOMYCIN HCL IN DEXTROSE 1-5 GM/200ML-% IV SOLN
1000.0000 mg | Freq: Once | INTRAVENOUS | Status: AC
  Administered 2017-07-02: 1000 mg via INTRAVENOUS
  Filled 2017-07-02: qty 200

## 2017-07-02 MED ORDER — MOMETASONE FURO-FORMOTEROL FUM 200-5 MCG/ACT IN AERO
2.0000 | INHALATION_SPRAY | Freq: Two times a day (BID) | RESPIRATORY_TRACT | Status: DC
  Administered 2017-07-03 – 2017-07-11 (×16): 2 via RESPIRATORY_TRACT
  Filled 2017-07-02 (×2): qty 8.8

## 2017-07-02 MED ORDER — FAMOTIDINE IN NACL 20-0.9 MG/50ML-% IV SOLN
20.0000 mg | Freq: Two times a day (BID) | INTRAVENOUS | Status: AC
  Administered 2017-07-02 (×2): 20 mg via INTRAVENOUS
  Filled 2017-07-02: qty 50

## 2017-07-02 MED ORDER — METOPROLOL TARTRATE 12.5 MG HALF TABLET
12.5000 mg | ORAL_TABLET | Freq: Two times a day (BID) | ORAL | Status: DC
  Administered 2017-07-03 – 2017-07-05 (×5): 12.5 mg via ORAL
  Filled 2017-07-02 (×5): qty 1

## 2017-07-02 MED ORDER — CHLORHEXIDINE GLUCONATE 0.12 % MT SOLN
15.0000 mL | Freq: Once | OROMUCOSAL | Status: AC
  Administered 2017-07-02: 15 mL via OROMUCOSAL
  Filled 2017-07-02: qty 15

## 2017-07-02 MED ORDER — EPHEDRINE SULFATE 50 MG/ML IJ SOLN
INTRAMUSCULAR | Status: DC | PRN
  Administered 2017-07-02: 5 mg via INTRAVENOUS
  Administered 2017-07-02: 10 mg via INTRAVENOUS
  Administered 2017-07-02: 20 mg via INTRAVENOUS
  Administered 2017-07-02: 15 mg via INTRAVENOUS
  Administered 2017-07-02: 20 mg via INTRAVENOUS

## 2017-07-02 MED ORDER — NOREPINEPHRINE BITARTRATE 1 MG/ML IV SOLN
0.0000 ug/min | Freq: Once | INTRAVENOUS | Status: AC
  Administered 2017-07-02: 1 ug/kg/min via INTRAVENOUS
  Filled 2017-07-02: qty 4

## 2017-07-02 MED ORDER — FENTANYL CITRATE (PF) 100 MCG/2ML IJ SOLN
25.0000 ug | INTRAMUSCULAR | Status: DC | PRN
  Administered 2017-07-03 – 2017-07-04 (×7): 25 ug via INTRAVENOUS
  Filled 2017-07-02 (×7): qty 2

## 2017-07-02 MED ORDER — LIDOCAINE 2% (20 MG/ML) 5 ML SYRINGE
INTRAMUSCULAR | Status: AC
  Filled 2017-07-02: qty 5

## 2017-07-02 MED ORDER — SODIUM CHLORIDE 0.9 % IV SOLN: Freq: Once | INTRAVENOUS | Status: DC

## 2017-07-02 MED ORDER — SODIUM CHLORIDE 0.9 % IR SOLN
Status: DC | PRN
  Administered 2017-07-02: 6000 mL

## 2017-07-02 MED ORDER — MILRINONE LACTATE IN DEXTROSE 20-5 MG/100ML-% IV SOLN
0.1250 ug/kg/min | INTRAVENOUS | Status: DC
  Administered 2017-07-02 – 2017-07-04 (×4): 0.25 ug/kg/min via INTRAVENOUS
  Administered 2017-07-04 – 2017-07-06 (×3): 0.125 ug/kg/min via INTRAVENOUS
  Filled 2017-07-02 (×6): qty 100

## 2017-07-02 MED ORDER — CALCIUM CHLORIDE 10 % IV SOLN
INTRAVENOUS | Status: DC | PRN
  Administered 2017-07-02: 1 g via INTRAVENOUS

## 2017-07-02 MED ORDER — CHLORHEXIDINE GLUCONATE 0.12% ORAL RINSE (MEDLINE KIT)
15.0000 mL | Freq: Two times a day (BID) | OROMUCOSAL | Status: DC
  Administered 2017-07-03 (×2): 15 mL via OROMUCOSAL

## 2017-07-02 MED ORDER — HEPARIN SODIUM (PORCINE) 1000 UNIT/ML IJ SOLN
INTRAMUSCULAR | Status: AC
  Filled 2017-07-02: qty 1

## 2017-07-02 MED ORDER — OXYCODONE HCL 5 MG PO TABS
5.0000 mg | ORAL_TABLET | ORAL | Status: DC | PRN
  Administered 2017-07-03: 5 mg via ORAL
  Administered 2017-07-04 – 2017-07-12 (×19): 10 mg via ORAL
  Filled 2017-07-02 (×12): qty 2
  Filled 2017-07-02: qty 1
  Filled 2017-07-02 (×7): qty 2

## 2017-07-02 MED ORDER — DOCUSATE SODIUM 100 MG PO CAPS
200.0000 mg | ORAL_CAPSULE | Freq: Every day | ORAL | Status: DC
  Administered 2017-07-03 – 2017-07-11 (×9): 200 mg via ORAL
  Filled 2017-07-02 (×9): qty 2

## 2017-07-02 MED ORDER — TRAMADOL HCL 50 MG PO TABS
50.0000 mg | ORAL_TABLET | ORAL | Status: DC | PRN
  Administered 2017-07-09 – 2017-07-12 (×6): 100 mg via ORAL
  Filled 2017-07-02 (×8): qty 2

## 2017-07-02 MED ORDER — MAGNESIUM SULFATE 4 GM/100ML IV SOLN
4.0000 g | Freq: Once | INTRAVENOUS | Status: AC
  Administered 2017-07-02: 4 g via INTRAVENOUS
  Filled 2017-07-02: qty 100

## 2017-07-02 MED ORDER — PROTAMINE SULFATE 10 MG/ML IV SOLN
INTRAVENOUS | Status: DC | PRN
  Administered 2017-07-02: 40 mg via INTRAVENOUS
  Administered 2017-07-02: 100 mg via INTRAVENOUS
  Administered 2017-07-02: 10 mg via INTRAVENOUS
  Administered 2017-07-02: 250 mg via INTRAVENOUS
  Administered 2017-07-02: 50 mg via INTRAVENOUS

## 2017-07-02 MED ORDER — SODIUM CHLORIDE 0.9 % IV SOLN
INTRAVENOUS | Status: DC
  Administered 2017-07-02: 7.7 [IU]/h via INTRAVENOUS
  Administered 2017-07-03: 4.7 [IU]/h via INTRAVENOUS
  Filled 2017-07-02 (×3): qty 1

## 2017-07-02 MED ORDER — FENTANYL CITRATE (PF) 250 MCG/5ML IJ SOLN
INTRAMUSCULAR | Status: AC
  Filled 2017-07-02: qty 5

## 2017-07-02 MED ORDER — SODIUM CHLORIDE 0.9 % IV SOLN
0.0000 ug/kg/h | INTRAVENOUS | Status: DC
  Filled 2017-07-02: qty 2

## 2017-07-02 MED ORDER — NITROGLYCERIN IN D5W 200-5 MCG/ML-% IV SOLN: 0.0000 ug/min | INTRAVENOUS | Status: DC

## 2017-07-02 MED ORDER — LEVOTHYROXINE SODIUM 112 MCG PO TABS
112.0000 ug | ORAL_TABLET | Freq: Every day | ORAL | Status: DC
  Administered 2017-07-03 – 2017-07-12 (×10): 112 ug via ORAL
  Filled 2017-07-02 (×10): qty 1

## 2017-07-02 MED ORDER — DHEA 50 MG PO TABS: 50.0000 mg | ORAL_TABLET | Freq: Every day | ORAL | Status: DC

## 2017-07-02 MED ORDER — SODIUM CHLORIDE 0.9% FLUSH
3.0000 mL | Freq: Two times a day (BID) | INTRAVENOUS | Status: DC
  Administered 2017-07-03 – 2017-07-11 (×15): 3 mL via INTRAVENOUS

## 2017-07-02 MED ORDER — EPHEDRINE 5 MG/ML INJ
INTRAVENOUS | Status: AC
  Filled 2017-07-02: qty 10

## 2017-07-02 MED ORDER — ALBUTEROL SULFATE HFA 108 (90 BASE) MCG/ACT IN AERS
INHALATION_SPRAY | RESPIRATORY_TRACT | Status: DC | PRN
  Administered 2017-07-02: 4 via RESPIRATORY_TRACT

## 2017-07-02 MED ORDER — LIOTHYRONINE SODIUM 25 MCG PO TABS
25.0000 ug | ORAL_TABLET | Freq: Every morning | ORAL | Status: DC
Start: 2017-07-03 — End: 2017-07-12
  Administered 2017-07-03 – 2017-07-11 (×9): 25 ug via ORAL
  Filled 2017-07-02 (×10): qty 1

## 2017-07-02 MED ORDER — MILRINONE LACTATE IN DEXTROSE 20-5 MG/100ML-% IV SOLN
0.1250 ug/kg/min | Freq: Once | INTRAVENOUS | Status: AC
Start: 2017-07-02 — End: 2017-07-02
  Administered 2017-07-02: 0.25 ug/kg/min via INTRAVENOUS
  Filled 2017-07-02: qty 100

## 2017-07-02 MED ORDER — DEXTROSE 5 % IV SOLN
1.5000 g | Freq: Two times a day (BID) | INTRAVENOUS | Status: AC
  Administered 2017-07-03 – 2017-07-04 (×3): 1.5 g via INTRAVENOUS
  Filled 2017-07-02 (×4): qty 1.5

## 2017-07-02 MED ORDER — BISACODYL 5 MG PO TBEC
10.0000 mg | DELAYED_RELEASE_TABLET | Freq: Every day | ORAL | Status: DC
Start: 2017-07-03 — End: 2017-07-12
  Administered 2017-07-03 – 2017-07-10 (×8): 10 mg via ORAL
  Filled 2017-07-02 (×9): qty 2

## 2017-07-02 MED ORDER — SODIUM CHLORIDE 0.45 % IV SOLN
INTRAVENOUS | Status: DC | PRN
  Administered 2017-07-02: 20 mL via INTRAVENOUS

## 2017-07-02 MED ORDER — ALBUMIN HUMAN 5 % IV SOLN
250.0000 mL | INTRAVENOUS | Status: AC | PRN
  Administered 2017-07-02 – 2017-07-03 (×2): 250 mL via INTRAVENOUS

## 2017-07-02 MED ORDER — PROTAMINE SULFATE 10 MG/ML IV SOLN
INTRAVENOUS | Status: AC
  Filled 2017-07-02: qty 15

## 2017-07-02 MED ORDER — PROPOFOL 10 MG/ML IV BOLUS
INTRAVENOUS | Status: AC
  Filled 2017-07-02: qty 20

## 2017-07-02 MED ORDER — HEPARIN SODIUM (PORCINE) 1000 UNIT/ML IJ SOLN
INTRAMUSCULAR | Status: DC | PRN
  Administered 2017-07-02: 4 mL via INTRAVENOUS
  Administered 2017-07-02: 41 mL via INTRAVENOUS

## 2017-07-02 MED FILL — Magnesium Sulfate Inj 50%: INTRAMUSCULAR | Qty: 10 | Status: AC

## 2017-07-02 MED FILL — Potassium Chloride Inj 2 mEq/ML: INTRAVENOUS | Qty: 10 | Status: AC

## 2017-07-02 MED FILL — Heparin Sodium (Porcine) Inj 1000 Unit/ML: INTRAMUSCULAR | Qty: 30 | Status: AC

## 2017-07-02 SURGICAL SUPPLY — 122 items
ADAPTER CARDIO PERF ANTE/RETRO (ADAPTER) ×4 IMPLANT
ADH SRG 12 PREFL SYR 3 SPRDR (MISCELLANEOUS)
ADPR PRFSN 84XANTGRD RTRGD (ADAPTER) ×2
AGENT HMST KT MTR STRL THRMB (HEMOSTASIS) ×2
APL SRG 7X2 LUM MLBL SLNT (VASCULAR PRODUCTS) ×6
APPLICATOR TIP COSEAL (VASCULAR PRODUCTS) ×6 IMPLANT
ATTRACTOMAT 16X20 MAGNETIC DRP (DRAPES) ×4 IMPLANT
BAG DECANTER FOR FLEXI CONT (MISCELLANEOUS) ×4 IMPLANT
BLADE STERNUM SYSTEM 6 (BLADE) ×4 IMPLANT
BLADE SURG 12 STRL SS (BLADE) ×2 IMPLANT
BLADE SURG 15 STRL LF DISP TIS (BLADE) IMPLANT
BLADE SURG 15 STRL SS (BLADE) ×4
CANISTER SUCT 3000ML PPV (MISCELLANEOUS) ×4 IMPLANT
CANNULA GRAFT 8MMX50CM (Graft) ×2 IMPLANT
CANNULA GUNDRY RCSP 15FR (MISCELLANEOUS) ×4 IMPLANT
CANNULA SUMP PERICARDIAL (CANNULA) ×2 IMPLANT
CATH HEART VENT LEFT (CATHETERS) IMPLANT
CATH RETROPLEGIA CORONARY 14FR (CATHETERS) IMPLANT
CATH ROBINSON RED A/P 18FR (CATHETERS) ×2 IMPLANT
CATH/SQUID NICHOLS JEHLE COR (CATHETERS) ×2 IMPLANT
CAUTERY EYE LOW TEMP 1300F FIN (OPHTHALMIC RELATED) ×4 IMPLANT
CLIP FOGARTY SPRING 6M (CLIP) IMPLANT
CLIP VESOCCLUDE SM WIDE 24/CT (CLIP) ×2 IMPLANT
CONN ST 1/4X3/8  BEN (MISCELLANEOUS) ×4
CONN ST 1/4X3/8 BEN (MISCELLANEOUS) IMPLANT
CONT SPEC 4OZ CLIKSEAL STRL BL (MISCELLANEOUS) ×2 IMPLANT
COVER SURGICAL LIGHT HANDLE (MISCELLANEOUS) ×8 IMPLANT
CRADLE DONUT ADULT HEAD (MISCELLANEOUS) ×4 IMPLANT
DRAPE CARDIOVASCULAR INCISE (DRAPES) ×4
DRAPE SLUSH/WARMER DISC (DRAPES) IMPLANT
DRAPE SRG 135X102X78XABS (DRAPES) ×2 IMPLANT
DRSG AQUACEL AG ADV 3.5X14 (GAUZE/BANDAGES/DRESSINGS) ×8 IMPLANT
ELECT CAUTERY BLADE 6.4 (BLADE) IMPLANT
ELECT REM PT RETURN 9FT ADLT (ELECTROSURGICAL) ×8
ELECTRODE REM PT RTRN 9FT ADLT (ELECTROSURGICAL) ×4 IMPLANT
FELT TEFLON 1X6 (MISCELLANEOUS) ×4 IMPLANT
GAUZE SPONGE 4X4 12PLY STRL (GAUZE/BANDAGES/DRESSINGS) ×6 IMPLANT
GLOVE BIO SURGEON STRL SZ 6.5 (GLOVE) ×4 IMPLANT
GLOVE BIO SURGEONS STRL SZ 6.5 (GLOVE) ×4
GLOVE BIOGEL PI IND STRL 6 (GLOVE) IMPLANT
GLOVE BIOGEL PI INDICATOR 6 (GLOVE) ×2
GOWN STRL REUS W/ TWL LRG LVL3 (GOWN DISPOSABLE) ×8 IMPLANT
GOWN STRL REUS W/TWL LRG LVL3 (GOWN DISPOSABLE) ×40
GRAFT GELWEAVE VALSALVA 28 (Prosthesis & Implant Heart) IMPLANT
GRAFT GELWEAVE VALSALVA 28CM (Prosthesis & Implant Heart) ×4 IMPLANT
HANDLE STAPLE ENDO GIA SHORT (STAPLE) ×2
HEMOSTAT POWDER SURGIFOAM 1G (HEMOSTASIS) ×10 IMPLANT
HEMOSTAT SURGICEL 2X14 (HEMOSTASIS) ×2 IMPLANT
INSERT FOGARTY XLG (MISCELLANEOUS) IMPLANT
KIT BASIN OR (CUSTOM PROCEDURE TRAY) ×4 IMPLANT
KIT ROOM TURNOVER OR (KITS) ×4 IMPLANT
KIT SUCTION CATH 14FR (SUCTIONS) IMPLANT
LINE VENT (MISCELLANEOUS) ×2 IMPLANT
MARKER GRAFT CORONARY BYPASS (MISCELLANEOUS) IMPLANT
NS IRRIG 1000ML POUR BTL (IV SOLUTION) ×22 IMPLANT
PACK OPEN HEART (CUSTOM PROCEDURE TRAY) ×4 IMPLANT
PAD ARMBOARD 7.5X6 YLW CONV (MISCELLANEOUS) ×8 IMPLANT
PENCIL BUTTON HOLSTER BLD 10FT (ELECTRODE) IMPLANT
POWDER SURGICEL 3.0 GRAM (HEMOSTASIS) ×2 IMPLANT
RELOAD TRI 2.0 30 VAS MED SUL (STAPLE) ×2 IMPLANT
SEALANT SURG COSEAL 8ML (VASCULAR PRODUCTS) ×4 IMPLANT
SET CARDIOPLEGIA MPS 5001102 (MISCELLANEOUS) ×2 IMPLANT
SET VEIN GRAFT PERF (SET/KITS/TRAYS/PACK) ×2 IMPLANT
SPONGE LAP 18X18 X RAY DECT (DISPOSABLE) ×4 IMPLANT
SPONGE LAP 4X18 X RAY DECT (DISPOSABLE) ×4 IMPLANT
STAPLER ENDO GIA 12 SHRT THIN (STAPLE) IMPLANT
STAPLER ENDO GIA 12MM SHORT (STAPLE) ×2 IMPLANT
STAPLER VISISTAT 35W (STAPLE) ×2 IMPLANT
SURGIFLO W/THROMBIN 8M KIT (HEMOSTASIS) ×2 IMPLANT
SUT ETHIBON 2 0 V 52N 30 (SUTURE) ×6 IMPLANT
SUT ETHIBON EXCEL 2-0 V-5 (SUTURE) IMPLANT
SUT ETHIBOND 2 0 SH (SUTURE) ×16
SUT ETHIBOND 2 0 SH 36X2 (SUTURE) IMPLANT
SUT ETHIBOND 2 0 V4 (SUTURE) IMPLANT
SUT ETHIBOND 2 0V4 GREEN (SUTURE) IMPLANT
SUT ETHIBOND 4 0 RB 1 (SUTURE) IMPLANT
SUT ETHIBOND V-5 VALVE (SUTURE) IMPLANT
SUT PROLENE 3 0 SH 1 (SUTURE) IMPLANT
SUT PROLENE 3 0 SH DA (SUTURE) ×6 IMPLANT
SUT PROLENE 4 0 RB 1 (SUTURE) ×84
SUT PROLENE 4 0 SH DA (SUTURE) ×12 IMPLANT
SUT PROLENE 4-0 RB1 .5 CRCL 36 (SUTURE) IMPLANT
SUT PROLENE 5 0 C 1 36 (SUTURE) ×28 IMPLANT
SUT PROLENE 6 0 C 1 30 (SUTURE) ×6 IMPLANT
SUT PROLENE 6 0 CC (SUTURE) ×4 IMPLANT
SUT SILK  1 MH (SUTURE) ×10
SUT SILK 1 MH (SUTURE) ×4 IMPLANT
SUT SILK 1 TIES 10X30 (SUTURE) ×6 IMPLANT
SUT SILK 2 0 (SUTURE) ×4
SUT SILK 2 0 SH CR/8 (SUTURE) ×14 IMPLANT
SUT SILK 2 0 TIES 10X30 (SUTURE) ×2 IMPLANT
SUT SILK 2 0 TIES 17X18 (SUTURE) ×4
SUT SILK 2-0 18XBRD TIE 12 (SUTURE) ×2 IMPLANT
SUT SILK 2-0 18XBRD TIE BLK (SUTURE) IMPLANT
SUT SILK 3 0 (SUTURE) ×4
SUT SILK 3 0 SH CR/8 (SUTURE) ×6 IMPLANT
SUT SILK 3-0 18XBRD TIE 12 (SUTURE) IMPLANT
SUT SILK 4 0 (SUTURE) ×4
SUT SILK 4 0 TIE 10X30 (SUTURE) ×4 IMPLANT
SUT SILK 4-0 18XBRD TIE 12 (SUTURE) ×2 IMPLANT
SUT STEEL 6MS V (SUTURE) IMPLANT
SUT TEM PAC WIRE 2 0 SH (SUTURE) ×20 IMPLANT
SUT VIC AB 1 CTX 18 (SUTURE) ×2 IMPLANT
SUT VIC AB 1 CTX 36 (SUTURE)
SUT VIC AB 1 CTX36XBRD ANBCTR (SUTURE) IMPLANT
SUT VIC AB 2-0 CTX 27 (SUTURE) ×14 IMPLANT
SUT VIC AB 3-0 X1 27 (SUTURE) ×6 IMPLANT
SYR 10ML KIT SKIN ADHESIVE (MISCELLANEOUS) IMPLANT
SYSTEM SAHARA CHEST DRAIN ATS (WOUND CARE) ×4 IMPLANT
TAPE CLOTH SURG 4X10 WHT LF (GAUZE/BANDAGES/DRESSINGS) ×2 IMPLANT
TAPE PAPER 2X10 WHT MICROPORE (GAUZE/BANDAGES/DRESSINGS) ×2 IMPLANT
TOWEL GREEN STERILE (TOWEL DISPOSABLE) ×16 IMPLANT
TOWEL GREEN STERILE FF (TOWEL DISPOSABLE) ×8 IMPLANT
TOWEL OR 17X24 6PK STRL BLUE (TOWEL DISPOSABLE) ×4 IMPLANT
TOWEL OR 17X26 10 PK STRL BLUE (TOWEL DISPOSABLE) ×4 IMPLANT
TRAY FOLEY SILVER 14FR TEMP (SET/KITS/TRAYS/PACK) ×2 IMPLANT
TRAY FOLEY SILVER 16FR TEMP (SET/KITS/TRAYS/PACK) ×2 IMPLANT
UNDERPAD 30X30 (UNDERPADS AND DIAPERS) ×4 IMPLANT
VALVE MAGNA EASE AORTIC 25MM (Prosthesis & Implant Heart) ×2 IMPLANT
VENT LEFT HEART 12002 (CATHETERS) ×4
WATER STERILE IRR 1000ML POUR (IV SOLUTION) ×8 IMPLANT
YANKAUER SUCT BULB TIP NO VENT (SUCTIONS) ×4 IMPLANT

## 2017-07-02 NOTE — Anesthesia Procedure Notes (Signed)
Central Venous Catheter Insertion Performed by: Murvin Natal, anesthesiologist Start/End9/13/2018 7:00 AM, 07/02/2017 7:15 AM Patient location: Pre-op. Preanesthetic checklist: patient identified, IV checked, site marked, risks and benefits discussed, surgical consent, monitors and equipment checked, pre-op evaluation, timeout performed and anesthesia consent Position: Trendelenburg Lidocaine 1% used for infiltration and patient sedated Hand hygiene performed  and maximum sterile barriers used  Catheter size: 9 Fr Total catheter length 12. PA cath was placed.MAC introducer Swan type:thermodilution PA Cath depth:55 Procedure performed using ultrasound guided technique. Ultrasound Notes:anatomy identified, needle tip was noted to be adjacent to the nerve/plexus identified, no ultrasound evidence of intravascular and/or intraneural injection and image(s) printed for medical record Attempts: 1 Patient tolerated the procedure well with no immediate complications.

## 2017-07-02 NOTE — OR Nursing (Signed)
[id number] Cath Lab called and made aware pt will be rolling through to SICU in app. 20 mins.

## 2017-07-02 NOTE — Brief Op Note (Addendum)
07/02/2017  1:41 PM  PATIENT:  Grant [name]  66 y.o. male  PRE-OPERATIVE DIAGNOSIS:  AI ROOT ANEURYSM  POST-OPERATIVE DIAGNOSIS:  AI ROOT ANEURYSM  PROCEDURE:  Procedure(s) with comments: BENTALL PROCEDURE (N/A) - WITH CIRC ARREST TRANSESOPHAGEAL ECHOCARDIOGRAM (TEE) (N/A)   25 mm pericardial valve with 28 mm Valsalva graft, reimplantation of coronaries 35 min  Hypothermic circulatory arrest  SURGEON:  Surgeon(s) and Role:    Ivin Poot, MD - Primary  PHYSICIAN ASSISTANT:  Nicholes Rough, PA-C   ANESTHESIA:   general  EBL:  Total I/O In: 2700 [I.V.:2700] Out: 1800 [Urine:1800]  BLOOD ADMINISTERED:none  DRAINS: ROUTINE   LOCAL MEDICATIONS USED:  NONE  SPECIMEN:  Source of Specimen:  AORTA, AORTIC VALVE LEAFLETS  DISPOSITION OF SPECIMEN:  PATHOLOGY  COUNTS:  YES  TOURNIQUET:  * No tourniquets in log *  DICTATION: .Dragon Dictation  PLAN OF CARE: Admit to inpatient   PATIENT DISPOSITION:  ICU - intubated and hemodynamically stable.   Delay start of Pharmacological VTE agent (>24hrs) due to surgical blood loss or risk of bleeding: yes

## 2017-07-02 NOTE — Anesthesia Procedure Notes (Signed)
Central Venous Catheter Insertion Performed by: Belinda Block, anesthesiologist Start/End9/13/2018 6:40 AM, 07/02/2017 6:55 AM Patient location: Pre-op. Preanesthetic checklist: patient identified, IV checked, site marked, risks and benefits discussed, surgical consent, monitors and equipment checked, pre-op evaluation and timeout performed Position: Trendelenburg Hand hygiene performed , maximum sterile barriers used  and Seldinger technique used PA cath was placed.Sheath introducer Swan type:thermodilution Procedure performed using ultrasound guided technique. Ultrasound Notes:anatomy identified Attempts: 1 Following insertion, line sutured. Post procedure assessment: blood return through all ports  Patient tolerated the procedure well with no immediate complications.

## 2017-07-02 NOTE — Anesthesia Procedure Notes (Signed)
Arterial Line Insertion Start/End9/13/2018 6:40 AM, 07/02/2017 6:48 AM Performed by: Rondall Allegra, Melodie Ashworth ANN, CRNA  Patient location: Pre-op. Preanesthetic checklist: patient identified, IV checked, site marked, risks and benefits discussed, surgical consent, monitors and equipment checked, pre-op evaluation, timeout performed and anesthesia consent Lidocaine 1% used for infiltration radial was placed Catheter size: 20 G Hand hygiene performed , maximum sterile barriers used  and Seldinger technique used Allen's test indicative of satisfactory collateral circulation Attempts: 1 Procedure performed without using ultrasound guided technique. Ultrasound Notes:anatomy identified, needle tip was noted to be adjacent to the nerve/plexus identified and no ultrasound evidence of intravascular and/or intraneural injection Following insertion, dressing applied and Biopatch. Post procedure assessment: normal and unchanged  Patient tolerated the procedure well with no immediate complications.

## 2017-07-02 NOTE — Anesthesia Procedure Notes (Signed)
Arterial Line Insertion Start/End9/13/2018 7:25 AM, 07/02/2017 7:30 AM Performed by: Kerrie Pleasure P  Patient location: Pre-op. Preanesthetic checklist: patient identified, IV checked, risks and benefits discussed, monitors and equipment checked and pre-op evaluation Lidocaine 1% used for infiltration and patient sedated Left, radial was placed Catheter size: 20 G Hand hygiene performed  and maximum sterile barriers used   Attempts: 1 Procedure performed without using ultrasound guided technique. Following insertion, dressing applied and Biopatch. Post procedure assessment: normal and unchanged  Patient tolerated the procedure well with no immediate complications.

## 2017-07-02 NOTE — Transfer of Care (Signed)
Immediate Anesthesia Transfer of Care Note  Patient: Grant [name]  Procedure(s) Performed: Procedure(s) with comments: BENTALL PROCEDURE (N/A) - WITH CIRC ARREST TRANSESOPHAGEAL ECHOCARDIOGRAM (TEE) (N/A)  Patient Location: SICU  Anesthesia Type:General  Level of Consciousness: sedated and Patient remains intubated per anesthesia plan  Airway & Oxygen Therapy: Patient remains intubated per anesthesia plan and Patient placed on Ventilator (see vital sign flow sheet for setting)  Post-op Assessment: Report given to RN and Post -op Vital signs reviewed and stable  Post vital signs: Reviewed and stable  Last Vitals:  Vitals:   07/02/17 0553 07/02/17 1725  BP: (!) 153/82   Pulse: 65 83  Resp: 19 16  Temp: 36.8 C   SpO2: 97% 96%    Last Pain:  Vitals:   07/02/17 0553  TempSrc: Oral      Patients Stated Pain Goal: 4 (16/10/96 0454)  Complications: No apparent anesthesia complications

## 2017-07-02 NOTE — Anesthesia Preprocedure Evaluation (Addendum)
Anesthesia Evaluation  Patient identified by MRN, date of birth, ID band Patient awake    Reviewed: Allergy & Precautions, H&P , NPO status , Patient's Chart, lab work & pertinent test results, reviewed documented beta blocker date and time   Airway Mallampati: III  TM Distance: >3 FB Neck ROM: full    Dental  (+) Dental Advisory Given, Implants, Teeth Intact Implant left upper front:   Pulmonary asthma , sleep apnea and Continuous Positive Airway Pressure Ventilation , COPD,  COPD inhaler, former smoker,  History chronic bronchitis   Pulmonary exam normal breath sounds clear to auscultation       Cardiovascular Exercise Tolerance: Good hypertension, Pt. on medications and Pt. on home beta blockers Normal cardiovascular exam+ dysrhythmias Atrial Fibrillation + Valvular Problems/Murmurs AI  Rhythm:regular Rate:Normal  ECG - NSR, rate 63  1. Normal filling pressures, no pulmonary hypertension.  2. Preserved cardiac output.  3. 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  No significant coronary disease.   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 Compared to last study the aortic root (4.7cm>>5cm) and ascending aortic dimensions (5.2cm>>5.4cm) have increased in size.  Cardiovascular: The aortic root measures 4.7 cm at the level of the sinuses of Valsalva. The ascending thoracic aorta measures 5.2 cm in greatest diameter. The proximal arch measures 3.7 cm and the distal arch measures 2.9 cm. The descending thoracic aorta measures 3.0 cm. There is no evidence of aortic dissection. Proximal great vessels show normal patency and branching anatomy.   Neuro/Psych  Headaches, PSYCHIATRIC DISORDERS Anxiety Depression History toxic encephalopathy with cognitive dysfunction     GI/Hepatic Neg liver ROS, GERD  Medicated and Controlled,  Endo/Other  Hypothyroidism Morbid obesity  Renal/GU negative Renal ROS  negative genitourinary   Musculoskeletal  (+) Fibromyalgia -  Abdominal (+) + obese,   Peds  Hematology negative hematology ROS (+)   Anesthesia Other Findings   Reproductive/Obstetrics                           Anesthesia Physical  Anesthesia Plan  ASA: IV  Anesthesia Plan: General   Post-op Pain Management:    Induction: Intravenous  PONV Risk Score and Plan: 2 and Ondansetron and Dexamethasone  Airway Management Planned: Oral ETT  Additional Equipment: Arterial line, CVP, PA Cath, TEE and Ultrasound Guidance Line Placement  Intra-op Plan: Delibrate Circulatory arrest per surgeon request  Post-operative Plan: Post-operative intubation/ventilation  Informed Consent: I have reviewed the patients History and Physical, chart, labs and discussed the procedure including the risks, benefits and alternatives for the proposed anesthesia with the patient or authorized representative who has indicated his/her understanding and acceptance.   Dental advisory given  Plan Discussed with: CRNA and Surgeon  Anesthesia Plan Comments:       Anesthesia Quick Evaluation

## 2017-07-02 NOTE — OR Nursing (Signed)
73 First call made to SICU RN.

## 2017-07-02 NOTE — OR Nursing (Signed)
1625 Second call made to SICU RN. ETA 20 mins.

## 2017-07-02 NOTE — Progress Notes (Signed)
Pre Procedure note for inpatients:   Grant [name] has been scheduled for Procedure(s) with comments: BENTALL PROCEDURE (N/A) - POSSIBLE CIRC ARREST TRANSESOPHAGEAL ECHOCARDIOGRAM (TEE) (N/A) today. The various methods of treatment have been discussed with the patient. After consideration of the risks, benefits and treatment options the patient has consented to the planned procedure.   The patient has been seen and labs reviewed. There are no changes in the patient's condition to prevent proceeding with the planned procedure today.  Recent labs:  Lab Results  Component Value Date   WBC 6.7 06/30/2017   HGB 14.8 06/30/2017   HCT 43.8 06/30/2017   PLT 170 06/30/2017   GLUCOSE 109 (H) 06/30/2017   CHOL 184 05/05/2017   TRIG 225 (H) 05/05/2017   HDL 45 05/05/2017   LDLCALC 94 05/05/2017   ALT 24 06/30/2017   AST 26 06/30/2017   NA 136 06/30/2017   K 3.9 06/30/2017   CL 106 06/30/2017   CREATININE 0.89 06/30/2017   BUN 13 06/30/2017   CO2 21 (L) 06/30/2017   TSH 1.670 05/05/2017   INR 1.00 06/30/2017   HGBA1C 5.5 06/30/2017    Len Childs, MD 07/02/2017 7:03 AM

## 2017-07-02 NOTE — Anesthesia Procedure Notes (Signed)
Procedure Name: Intubation Date/Time: 07/02/2017 8:02 AM Performed by: Carney Living Pre-anesthesia Checklist: Patient identified, Emergency Drugs available, Suction available, Patient being monitored and Timeout performed Patient Re-evaluated:Patient Re-evaluated prior to induction Oxygen Delivery Method: Circle system utilized Preoxygenation: Pre-oxygenation with 100% oxygen Induction Type: IV induction Ventilation: Mask ventilation without difficulty and Oral airway inserted - appropriate to patient size Laryngoscope Size: Mac and 4 Grade View: Grade I Tube type: Oral Tube size: 8.0 mm Number of attempts: 1 Airway Equipment and Method: Stylet Placement Confirmation: ETT inserted through vocal cords under direct vision,  positive ETCO2 and breath sounds checked- equal and bilateral Secured at: 23 cm Tube secured with: Tape Dental Injury: Teeth and Oropharynx as per pre-operative assessment

## 2017-07-02 NOTE — Progress Notes (Signed)
  Echocardiogram Echocardiogram Transesophageal has been performed.  Grant [name] 07/02/2017, 9:37 AM

## 2017-07-02 NOTE — Progress Notes (Signed)
Patient ID: Grant [name], male   DOB: Mar 17, 1951, 66 y.o.   MRN: [id number]  SICU Evening Rounds:   Hemodynamically stable  CI = 2.6 on dop 3, milrinone 0.25, vasopressin 0.05, levophed 2  Not awake yet on vent. Plan slow wean.  Urine output good  CT output low  CBC    Component Value Date/Time   WBC 13.3 (H) 07/02/2017 1758   RBC 4.10 (L) 07/02/2017 1758   HGB 12.6 (L) 07/02/2017 1758   HGB 15.1 05/05/2017 0931   HCT 37.0 (L) 07/02/2017 1758   HCT 43.4 05/05/2017 0931   PLT 145 (L) 07/02/2017 1758   PLT 187 05/05/2017 0931   MCV 90.2 07/02/2017 1758   MCV 91 05/05/2017 0931   MCH 30.7 07/02/2017 1758   MCHC 34.1 07/02/2017 1758   RDW 13.2 07/02/2017 1758   RDW 14.3 05/05/2017 0931   LYMPHSABS 1.6 01/14/2017 1432   MONOABS 1.0 01/14/2017 1432   EOSABS 0.2 01/14/2017 1432   BASOSABS 0.0 01/14/2017 1432     BMET    Component Value Date/Time   NA 141 07/02/2017 1736   NA 142 05/05/2017 0931   K 3.9 07/02/2017 1736   CL 102 07/02/2017 1545   CO2 21 (L) 06/30/2017 1046   GLUCOSE 205 (H) 07/02/2017 1736   BUN 12 07/02/2017 1545   BUN 13 05/05/2017 0931   CREATININE 0.70 07/02/2017 1545   CREATININE 1.03 01/18/2016 1004   CALCIUM 9.4 06/30/2017 1046   GFRNONAA >60 06/30/2017 1046   GFRAA >60 06/30/2017 1046     A/P:  Stable postop course. Continue current plans

## 2017-07-02 NOTE — Progress Notes (Signed)
RT completed a recruitment maneuver for 2 minutes due to desat to 86%. Sat improved to 97%.

## 2017-07-02 NOTE — OR Nursing (Signed)
[place] call made to SICU RN.

## 2017-07-03 ENCOUNTER — Inpatient Hospital Stay (HOSPITAL_COMMUNITY): Payer: Medicare Other

## 2017-07-03 ENCOUNTER — Encounter (HOSPITAL_COMMUNITY): Payer: Self-pay | Admitting: Cardiothoracic Surgery

## 2017-07-03 LAB — GLUCOSE, CAPILLARY
Glucose-Capillary: 105 mg/dL — ABNORMAL HIGH (ref 65–99)
Glucose-Capillary: 107 mg/dL — ABNORMAL HIGH (ref 65–99)
Glucose-Capillary: 111 mg/dL — ABNORMAL HIGH (ref 65–99)
Glucose-Capillary: 112 mg/dL — ABNORMAL HIGH (ref 65–99)
Glucose-Capillary: 117 mg/dL — ABNORMAL HIGH (ref 65–99)
Glucose-Capillary: 125 mg/dL — ABNORMAL HIGH (ref 65–99)
Glucose-Capillary: 134 mg/dL — ABNORMAL HIGH (ref 65–99)
Glucose-Capillary: 135 mg/dL — ABNORMAL HIGH (ref 65–99)
Glucose-Capillary: 136 mg/dL — ABNORMAL HIGH (ref 65–99)
Glucose-Capillary: 136 mg/dL — ABNORMAL HIGH (ref 65–99)
Glucose-Capillary: 147 mg/dL — ABNORMAL HIGH (ref 65–99)
Glucose-Capillary: 166 mg/dL — ABNORMAL HIGH (ref 65–99)
Glucose-Capillary: 172 mg/dL — ABNORMAL HIGH (ref 65–99)
Glucose-Capillary: 177 mg/dL — ABNORMAL HIGH (ref 65–99)
Glucose-Capillary: 186 mg/dL — ABNORMAL HIGH (ref 65–99)
Glucose-Capillary: 53 mg/dL — ABNORMAL LOW (ref 65–99)

## 2017-07-03 LAB — POCT I-STAT 3, ART BLOOD GAS (G3+)
Acid-Base Excess: 3 mmol/L — ABNORMAL HIGH (ref 0.0–2.0)
Acid-base deficit: 1 mmol/L (ref 0.0–2.0)
Acid-base deficit: 3 mmol/L — ABNORMAL HIGH (ref 0.0–2.0)
Acid-base deficit: 4 mmol/L — ABNORMAL HIGH (ref 0.0–2.0)
Acid-base deficit: 2 mmol/L (ref 0.0–2.0)
Acid-base deficit: 6 mmol/L — ABNORMAL HIGH (ref 0.0–2.0)
Bicarbonate: 25.4 mmol/L (ref 20.0–28.0)
Bicarbonate: 21.8 mmol/L (ref 20.0–28.0)
Bicarbonate: 21.2 mmol/L (ref 20.0–28.0)
Bicarbonate: 22.9 mmol/L (ref 20.0–28.0)
Bicarbonate: 18.5 mmol/L — ABNORMAL LOW (ref 20.0–28.0)
Bicarbonate: 23.7 mmol/L (ref 20.0–28.0)
Bicarbonate: 27 mmol/L (ref 20.0–28.0)
O2 Saturation: 87 %
O2 Saturation: 96 %
O2 Saturation: 95 %
O2 Saturation: 95 %
O2 Saturation: 95 %
O2 Saturation: 94 %
O2 Saturation: 95 %
Patient temperature: 36.8
Patient temperature: 36.8
Patient temperature: 37.1
Patient temperature: 37.1
Patient temperature: 37
Patient temperature: 98.4
TCO2: 27 mmol/L (ref 22–32)
TCO2: 23 mmol/L (ref 22–32)
TCO2: 22 mmol/L (ref 22–32)
TCO2: 24 mmol/L (ref 22–32)
TCO2: 19 mmol/L — ABNORMAL LOW (ref 22–32)
TCO2: 25 mmol/L (ref 22–32)
TCO2: 28 mmol/L (ref 22–32)
pCO2 arterial: 51.7 mmHg — ABNORMAL HIGH (ref 32.0–48.0)
pCO2 arterial: 36.6 mmHg (ref 32.0–48.0)
pCO2 arterial: 37 mmHg (ref 32.0–48.0)
pCO2 arterial: 36.8 mmHg (ref 32.0–48.0)
pCO2 arterial: 32.7 mmHg (ref 32.0–48.0)
pCO2 arterial: 35.7 mmHg (ref 32.0–48.0)
pCO2 arterial: 38.1 mmHg (ref 32.0–48.0)
pH, Arterial: 7.298 — ABNORMAL LOW (ref 7.350–7.450)
pH, Arterial: 7.382 (ref 7.350–7.450)
pH, Arterial: 7.365 (ref 7.350–7.450)
pH, Arterial: 7.403 (ref 7.350–7.450)
pH, Arterial: 7.361 (ref 7.350–7.450)
pH, Arterial: 7.43 (ref 7.350–7.450)
pH, Arterial: 7.458 — ABNORMAL HIGH (ref 7.350–7.450)
pO2, Arterial: 58 mmHg — ABNORMAL LOW (ref 83.0–108.0)
pO2, Arterial: 82 mmHg — ABNORMAL LOW (ref 83.0–108.0)
pO2, Arterial: 79 mmHg — ABNORMAL LOW (ref 83.0–108.0)
pO2, Arterial: 73 mmHg — ABNORMAL LOW (ref 83.0–108.0)
pO2, Arterial: 78 mmHg — ABNORMAL LOW (ref 83.0–108.0)
pO2, Arterial: 67 mmHg — ABNORMAL LOW (ref 83.0–108.0)
pO2, Arterial: 71 mmHg — ABNORMAL LOW (ref 83.0–108.0)

## 2017-07-03 LAB — ECHO TEE
AO mean calculated velocity dopler: 165 cm/s
AV Mean grad: 13 mmHg
AV Peak grad: 22 mmHg
AV Vena cont: 0.59 cm
AV pk vel: 236 cm/s
Annulus: 2.4 cm
Ao-asc: 4.6 cm
Ao-desc: 2.6 cm
FS: 38 % (ref 28–44)
LVOT area: 4.52 cm2
LVOT diameter: 24 mm
P 1/2 time: 534 ms
STJ: 4 cm
Sinus: 4.6 cm
VTI: 41.9 cm

## 2017-07-03 LAB — BPAM FFP
Blood Product Expiration Date: 201809152359
Blood Product Expiration Date: 201809152359
ISSUE DATE / TIME: 201809131339
ISSUE DATE / TIME: 201809131339
Unit Type and Rh: 600
Unit Type and Rh: 6200

## 2017-07-03 LAB — BASIC METABOLIC PANEL
Anion gap: 4 — ABNORMAL LOW (ref 5–15)
BUN: 12 mg/dL (ref 6–20)
CO2: 21 mmol/L — ABNORMAL LOW (ref 22–32)
Calcium: 7.9 mg/dL — ABNORMAL LOW (ref 8.9–10.3)
Chloride: 111 mmol/L (ref 101–111)
Creatinine, Ser: 1.08 mg/dL (ref 0.61–1.24)
GFR calc Af Amer: >60 mL/min (ref >60)
GFR calc non Af Amer: >60 mL/min (ref >60)
Glucose, Bld: 123 mg/dL — ABNORMAL HIGH (ref 65–99)
Potassium: 4.2 mmol/L (ref 3.5–5.1)
Sodium: 136 mmol/L (ref 135–145)

## 2017-07-03 LAB — PREPARE FRESH FROZEN PLASMA
Unit division: 0
Unit division: 0

## 2017-07-03 LAB — BPAM CRYOPRECIPITATE
Blood Product Expiration Date: 201809131936
ISSUE DATE / TIME: 201809131402
Unit Type and Rh: 6200

## 2017-07-03 LAB — POCT I-STAT, CHEM 8
BUN: 12 mg/dL (ref 6–20)
BUN: 18 mg/dL (ref 6–20)
Calcium, Ion: 1.14 mmol/L — ABNORMAL LOW (ref 1.15–1.40)
Calcium, Ion: 1.13 mmol/L — ABNORMAL LOW (ref 1.15–1.40)
Chloride: 105 mmol/L (ref 101–111)
Chloride: 101 mmol/L (ref 101–111)
Creatinine, Ser: 0.8 mg/dL (ref 0.61–1.24)
Creatinine, Ser: 1 mg/dL (ref 0.61–1.24)
Glucose, Bld: 188 mg/dL — ABNORMAL HIGH (ref 65–99)
Glucose, Bld: 140 mg/dL — ABNORMAL HIGH (ref 65–99)
HCT: 33 % — ABNORMAL LOW (ref 39.0–52.0)
HCT: 26 % — ABNORMAL LOW (ref 39.0–52.0)
Hemoglobin: 11.2 g/dL — ABNORMAL LOW (ref 13.0–17.0)
Hemoglobin: 8.8 g/dL — ABNORMAL LOW (ref 13.0–17.0)
Potassium: 4.7 mmol/L (ref 3.5–5.1)
Potassium: 4.1 mmol/L (ref 3.5–5.1)
Sodium: 139 mmol/L (ref 135–145)
Sodium: 138 mmol/L (ref 135–145)
TCO2: 22 mmol/L (ref 22–32)
TCO2: 26 mmol/L (ref 22–32)

## 2017-07-03 LAB — PREPARE PLATELET PHERESIS: Unit division: 0

## 2017-07-03 LAB — PREPARE CRYOPRECIPITATE: Unit division: 0

## 2017-07-03 LAB — CBC
HCT: 31.6 % — ABNORMAL LOW (ref 39.0–52.0)
HCT: 26.8 % — ABNORMAL LOW (ref 39.0–52.0)
Hemoglobin: 11 g/dL — ABNORMAL LOW (ref 13.0–17.0)
Hemoglobin: 9 g/dL — ABNORMAL LOW (ref 13.0–17.0)
MCH: 30.9 pg (ref 26.0–34.0)
MCH: 30.4 pg (ref 26.0–34.0)
MCHC: 34.8 g/dL (ref 30.0–36.0)
MCHC: 33.6 g/dL (ref 30.0–36.0)
MCV: 88.8 fL (ref 78.0–100.0)
MCV: 90.5 fL (ref 78.0–100.0)
Platelets: 128 10*3/uL — ABNORMAL LOW (ref 150–400)
Platelets: 95 10*3/uL — ABNORMAL LOW (ref 150–400)
RBC: 3.56 MIL/uL — ABNORMAL LOW (ref 4.22–5.81)
RBC: 2.96 MIL/uL — ABNORMAL LOW (ref 4.22–5.81)
RDW: 13.2 % (ref 11.5–15.5)
RDW: 13.4 % (ref 11.5–15.5)
WBC: 16.6 10*3/uL — ABNORMAL HIGH (ref 4.0–10.5)
WBC: 14.7 10*3/uL — ABNORMAL HIGH (ref 4.0–10.5)

## 2017-07-03 LAB — BPAM PLATELET PHERESIS
Blood Product Expiration Date: 201809142359
ISSUE DATE / TIME: 201809131339
Unit Type and Rh: 6200

## 2017-07-03 LAB — CREATININE, SERUM
Creatinine, Ser: 1.24 mg/dL (ref 0.61–1.24)
GFR calc Af Amer: >60 mL/min (ref >60)
GFR calc non Af Amer: 60 mL/min — ABNORMAL LOW (ref >60)

## 2017-07-03 LAB — COOXEMETRY PANEL
Carboxyhemoglobin: 1 % (ref 0.5–1.5)
Methemoglobin: 1.7 % — ABNORMAL HIGH (ref 0.0–1.5)
O2 Saturation: 69.6 %
Total hemoglobin: 10.3 g/dL — ABNORMAL LOW (ref 12.0–16.0)

## 2017-07-03 LAB — MAGNESIUM
Magnesium: 2.1 mg/dL (ref 1.7–2.4)
Magnesium: 2 mg/dL (ref 1.7–2.4)

## 2017-07-03 MED ORDER — FUROSEMIDE 10 MG/ML IJ SOLN
20.0000 mg | Freq: Two times a day (BID) | INTRAMUSCULAR | Status: DC
  Administered 2017-07-03 (×2): 20 mg via INTRAVENOUS
  Filled 2017-07-03: qty 2

## 2017-07-03 MED ORDER — SODIUM CHLORIDE 0.9 % IV SOLN
0.5000 ug/kg/h | INTRAVENOUS | Status: DC
  Administered 2017-07-03 (×2): 0.7 ug/kg/h via INTRAVENOUS
  Administered 2017-07-03: 0.5 ug/kg/h via INTRAVENOUS
  Filled 2017-07-03 (×4): qty 2

## 2017-07-03 MED ORDER — LORAZEPAM BOLUS VIA INFUSION
1.0000 mg | INTRAVENOUS | Status: DC | PRN
  Filled 2017-07-03: qty 1

## 2017-07-03 MED ORDER — INSULIN ASPART 100 UNIT/ML ~~LOC~~ SOLN
0.0000 [IU] | SUBCUTANEOUS | Status: DC
  Administered 2017-07-03 – 2017-07-04 (×4): 2 [IU] via SUBCUTANEOUS

## 2017-07-03 MED ORDER — HALOPERIDOL LACTATE 5 MG/ML IJ SOLN
5.0000 mg | Freq: Four times a day (QID) | INTRAMUSCULAR | Status: DC | PRN
  Administered 2017-07-03: 5 mg via INTRAVENOUS
  Filled 2017-07-03: qty 1

## 2017-07-03 MED ORDER — INSULIN DETEMIR 100 UNIT/ML ~~LOC~~ SOLN
18.0000 [IU] | Freq: Two times a day (BID) | SUBCUTANEOUS | Status: DC
  Administered 2017-07-03: 18 [IU] via SUBCUTANEOUS
  Filled 2017-07-03 (×2): qty 0.18

## 2017-07-03 MED ORDER — VANCOMYCIN HCL IN DEXTROSE 1-5 GM/200ML-% IV SOLN
1000.0000 mg | Freq: Two times a day (BID) | INTRAVENOUS | Status: AC
  Administered 2017-07-03 – 2017-07-04 (×4): 1000 mg via INTRAVENOUS
  Filled 2017-07-03 (×4): qty 200

## 2017-07-03 MED ORDER — CLONAZEPAM 1 MG PO TABS
1.0000 mg | ORAL_TABLET | Freq: Every day | ORAL | Status: DC
  Administered 2017-07-03 – 2017-07-11 (×9): 1 mg via ORAL
  Filled 2017-07-03 (×9): qty 1

## 2017-07-03 MED ORDER — SODIUM BICARBONATE 8.4 % IV SOLN
100.0000 meq | Freq: Once | INTRAVENOUS | Status: AC
  Administered 2017-07-03: 100 meq via INTRAVENOUS

## 2017-07-03 MED ORDER — LEVALBUTEROL HCL 1.25 MG/0.5ML IN NEBU
1.2500 mg | INHALATION_SOLUTION | Freq: Four times a day (QID) | RESPIRATORY_TRACT | Status: DC
  Administered 2017-07-03 – 2017-07-06 (×11): 1.25 mg via RESPIRATORY_TRACT
  Filled 2017-07-03 (×13): qty 0.5

## 2017-07-03 MED ORDER — AMIODARONE HCL 200 MG PO TABS
200.0000 mg | ORAL_TABLET | Freq: Two times a day (BID) | ORAL | Status: DC
  Administered 2017-07-03 – 2017-07-04 (×4): 200 mg via ORAL
  Filled 2017-07-03 (×4): qty 1

## 2017-07-03 MED ORDER — FUROSEMIDE 10 MG/ML IJ SOLN
40.0000 mg | Freq: Two times a day (BID) | INTRAMUSCULAR | Status: DC
  Administered 2017-07-04 – 2017-07-06 (×6): 40 mg via INTRAVENOUS
  Filled 2017-07-03 (×6): qty 4

## 2017-07-03 MED ORDER — DEXMEDETOMIDINE HCL IN NACL 400 MCG/100ML IV SOLN
0.0000 ug/kg/h | INTRAVENOUS | Status: DC
  Administered 2017-07-03: 0.1 ug/kg/h via INTRAVENOUS
  Administered 2017-07-03: 0.3 ug/kg/h via INTRAVENOUS
  Filled 2017-07-03 (×3): qty 100

## 2017-07-03 MED ORDER — AZELASTINE HCL 0.1 % NA SOLN
2.0000 | Freq: Two times a day (BID) | NASAL | Status: DC
  Administered 2017-07-03 – 2017-07-11 (×13): 2 via NASAL
  Filled 2017-07-03 (×2): qty 30

## 2017-07-03 MED ORDER — LORAZEPAM 2 MG/ML IJ SOLN
1.0000 mg | INTRAMUSCULAR | Status: DC | PRN
  Administered 2017-07-04 – 2017-07-06 (×8): 1 mg via INTRAVENOUS
  Filled 2017-07-03 (×9): qty 1

## 2017-07-03 MED ORDER — LEVALBUTEROL TARTRATE 45 MCG/ACT IN AERO: 2.0000 | INHALATION_SPRAY | Freq: Four times a day (QID) | RESPIRATORY_TRACT | Status: DC

## 2017-07-03 MED ORDER — BUDESONIDE 0.5 MG/2ML IN SUSP
0.5000 mg | Freq: Two times a day (BID) | RESPIRATORY_TRACT | Status: DC
  Administered 2017-07-03 – 2017-07-12 (×17): 0.5 mg via RESPIRATORY_TRACT
  Filled 2017-07-03 (×18): qty 2

## 2017-07-03 MED ORDER — INSULIN DETEMIR 100 UNIT/ML ~~LOC~~ SOLN
12.0000 [IU] | Freq: Two times a day (BID) | SUBCUTANEOUS | Status: DC
  Administered 2017-07-03 – 2017-07-04 (×3): 12 [IU] via SUBCUTANEOUS
  Filled 2017-07-03 (×4): qty 0.12

## 2017-07-03 MED ORDER — DEXMEDETOMIDINE HCL IN NACL 400 MCG/100ML IV SOLN
0.3000 ug/kg/h | INTRAVENOUS | Status: DC
  Administered 2017-07-03 – 2017-07-04 (×2): 0.5 ug/kg/h via INTRAVENOUS
  Filled 2017-07-03 (×3): qty 100

## 2017-07-03 MED ORDER — ORAL CARE MOUTH RINSE
15.0000 mL | Freq: Two times a day (BID) | OROMUCOSAL | Status: DC
  Administered 2017-07-04 – 2017-07-05 (×2): 15 mL via OROMUCOSAL

## 2017-07-03 MED FILL — Lidocaine HCl IV Inj 20 MG/ML: INTRAVENOUS | Qty: 10 | Status: AC

## 2017-07-03 MED FILL — Mannitol IV Soln 20%: INTRAVENOUS | Qty: 500 | Status: AC

## 2017-07-03 MED FILL — Sodium Bicarbonate IV Soln 8.4%: INTRAVENOUS | Qty: 50 | Status: AC

## 2017-07-03 MED FILL — Sodium Chloride IV Soln 0.9%: INTRAVENOUS | Qty: 3000 | Status: AC

## 2017-07-03 MED FILL — Dexmedetomidine HCl in NaCl 0.9% IV Soln 400 MCG/100ML: INTRAVENOUS | Qty: 100 | Status: AC

## 2017-07-03 MED FILL — Electrolyte-R (PH 7.4) Solution: INTRAVENOUS | Qty: 6000 | Status: AC

## 2017-07-03 NOTE — Progress Notes (Signed)
CT surgery p.m. Rounds  Patient neurologically intact but agitated, fidgety and with unrealistic requests He attributes most of his discomfort to his chronic fatigue syndrome but probably postoperative pain is a big part of his problem. We are encouraging him to use standard postoperative pain management protocol. Foley catheter was removed earlier today to remove one focus of his discomfort He was able to get out of bed and sit in the chair for 30 minutes. He is displaying very little understanding to the importance of following caregivers recommendations.Grant [name] continue to provide optimal care.

## 2017-07-03 NOTE — Care Management Note (Signed)
Case Management Note  Patient Details  Name: Grant [name] MRN: [id number] Date of Birth: 1950-11-29  Subjective/Objective:   From home with spouse, pta indep, he has a lift chair, and a cpap machine at home, he is  post op Bentall procedure, had some delerim last pm, now extubated conts on milrinone, levophed, cont insulin, lasix, iv abx and precedex.  Wife is retired she will be with patient at discharge 24/7.                  Action/Plan: NCM will follow for dc needs.   Expected Discharge Date:  07/09/17               Expected Discharge Plan:     In-House Referral:     Discharge planning Services  CM Consult  Post Acute Care Choice:    Choice offered to:     DME Arranged:    DME Agency:     HH Arranged:    HH Agency:     Status of Service:  In process, will continue to follow  If discussed at Long Length of Stay Meetings, dates discussed:    Additional Comments:  Zenon Mayo, RN 07/03/2017, 1:33 PM

## 2017-07-03 NOTE — Anesthesia Postprocedure Evaluation (Signed)
Anesthesia Post Note  Patient: Grant [name]  Procedure(s) Performed: Procedure(s) (LRB): BENTALL PROCEDURE (N/A) TRANSESOPHAGEAL ECHOCARDIOGRAM (TEE) (N/A)     Patient location during evaluation: SICU Anesthesia Type: General Level of consciousness: awake Pain management: pain level controlled Vital Signs Assessment: post-procedure vital signs reviewed and stable Respiratory status: spontaneous breathing, respiratory function stable, nonlabored ventilation and patient connected to nasal cannula oxygen Cardiovascular status: stable Postop Assessment: no apparent nausea or vomiting Anesthetic complications: no    Last Vitals:  Vitals:   07/03/17 0454 07/03/17 0500  BP:  (!) 116/54  Pulse: 84 85  Resp: (!) 21 (!) 21  Temp: 37.3 C 37.3 C  SpO2: 96% 98%    Last Pain:  Vitals:   07/02/17 0553  TempSrc: Oral   Pain Goal: Patients Stated Pain Goal: 4 (07/02/17 0614)               Karyl Kinnier Ellender

## 2017-07-03 NOTE — Progress Notes (Signed)
1 Day Post-Op Procedure(s) (LRB): BENTALL PROCEDURE (N/A) TRANSESOPHAGEAL ECHOCARDIOGRAM (TEE) (N/A) Subjective: Extubated Some delerium last nite nsr CXR clear 1+ air leak  Objective: Vital signs in last 24 hours: Temp:  [95.9 F (35.5 C)-99.1 F (37.3 C)] 98.6 F (37 C) (09/14 0800) Pulse Rate:  [59-91] 86 (09/14 0800) Cardiac Rhythm: Normal sinus rhythm (09/14 0015) Resp:  [15-28] 18 (09/14 0800) BP: (95-116)/(54-70) 114/65 (09/14 0800) SpO2:  [92 %-100 %] 92 % (09/14 0800) Arterial Line BP: (102-139)/(55-76) 112/56 (09/13 2315) FiO2 (%):  [40 %-100 %] 40 % (09/14 0042) Weight:  [291 lb 0.1 oz (132 kg)] 291 lb 0.1 oz (132 kg) (09/14 0251)  Hemodynamic parameters for last 24 hours: PAP: (26-43)/(9-21) 37/17 CO:  [6 L/min-9.6 L/min] 9.6 L/min CI:  [2.5 L/min/m2-4 L/min/m2] 4 L/min/m2  Intake/Output from previous day: 09/13 0701 - 09/14 0700 In: 14391.2 [I.V.:11724.2; Blood:1807; NG/GT:60; IV Piggyback:800] Out: 1062 [Urine:3540; Emesis/NG output:300; Blood:3500; Chest Tube:390] Intake/Output this shift: No intake/output data recorded.       Exam    General- alert and comfortable   Lungs- clear without rales, wheezes   Cor- regular rate and rhythm, no murmur , gallop   Abdomen- soft, non-tender   Extremities - warm, non-tender, minimal edema   Neuro- oriented, appropriate, no focal weakness   Lab Results:  Recent Labs  07/02/17 2212 07/03/17 0322  WBC 14.4* 16.6*  HGB 11.2*  11.5* 11.0*  HCT 33.0*  34.0* 31.6*  PLT 134* 128*   BMET:  Recent Labs  06/30/17 1046  07/02/17 2212 07/03/17 0322  NA 136  < > 139 136  K 3.9  < > 4.7 4.2  CL 106  < > 105 111  CO2 21*  --   --  21*  GLUCOSE 109*  < > 188* 123*  BUN 13  < > 12 12  CREATININE 0.89  < > 0.80  1.00 1.08  CALCIUM 9.4  --   --  7.9*  < > = values in this interval not displayed.  PT/INR:  Recent Labs  07/02/17 1758  LABPROT 16.6*  INR 1.36   ABG    Component Value Date/Time   PHART 7.361 07/03/2017 0627   HCO3 18.5 (L) 07/03/2017 0627   TCO2 19 (L) 07/03/2017 0627   ACIDBASEDEF 6.0 (H) 07/03/2017 0627   O2SAT 95.0 07/03/2017 0627   CBG (last 3)   Recent Labs  07/03/17 0332 07/03/17 0511 07/03/17 0614  GLUCAP 136* 107* 105*    Assessment/Plan: S/P Procedure(s) (LRB): BENTALL PROCEDURE (N/A) TRANSESOPHAGEAL ECHOCARDIOGRAM (TEE) (N/A) Mobilize Diuresis See progression orders   LOS: 1 day    Tharon Aquas Trigt III 07/03/2017

## 2017-07-03 NOTE — Progress Notes (Signed)
Patient's rate decreased to 4. Rapid Wean Protocol began at [place]. RT and RN at bedside.

## 2017-07-03 NOTE — Plan of Care (Signed)
Problem: Activity: Goal: Risk for activity intolerance will decrease Outcome: Progressing Pt was able to tolerate sitting in chair for 30 minutes today. Will need to continue to sit up in chair longer as the days progress.   Problem: Cardiac: Goal: Hemodynamic stability will improve Outcome: Progressing Pt is off temporary pacemaker and pacing wires are safely secured to skin.  Problem: Respiratory: Goal: Levels of oxygenation will improve Outcome: Progressing Pt is tolerating nasal cannula with oxygen saturations in the mid to high 90s.   Problem: Urinary Elimination: Goal: Ability to achieve and maintain adequate renal perfusion and functioning will improve Outcome: Progressing Pt voiding post foley removal.

## 2017-07-03 NOTE — Plan of Care (Signed)
Problem: Cardiac: Goal: Hemodynamic stability will improve Outcome: Progressing Pt remains on dopamine,milrinone,levophed Goal: Ability to maintain an adequate cardiac output will improve Outcome: Progressing CI>2 Goal: Will show no signs and symptoms of excessive bleeding Outcome: Progressing Minimal CTD  Problem: Physical Regulation: Goal: Diagnostic test results will improve Outcome: Progressing All labs WNL  Problem: Respiratory: Goal: Levels of oxygenation will improve Outcome: Progressing Pt extubated Goal: Respiratory status will improve Outcome: Progressing Pt extubated 9/14  Problem: Urinary Elimination: Goal: Ability to achieve and maintain adequate renal perfusion and functioning will improve Outcome: Progressing Pt UOP WNL

## 2017-07-03 NOTE — Progress Notes (Signed)
RT placed pt on CPAP with pressure setting of 16 and 4L bled in. Pt on nasal pillow mask from home. Pt tolerating well and VS are stable. RT to cont to monitor.

## 2017-07-03 NOTE — Procedures (Signed)
Extubation Procedure Note  Patient Details:   Name: Grant [name] DOB: 01-May-1951 MRN: [id number]   Airway Documentation:     Evaluation  O2 sats: stable throughout Complications: No apparent complications Patient did tolerate procedure well. Bilateral Breath Sounds: Clear, Diminished   Yes  Jori Moll 07/03/2017, 1:22 AM   Patient performed a NIF of -25, a FVC of 1.3L and cuff leak was present prior to extubation. Patient demonstrated ability to speak and was placed on 6L nasal cannula following extubation.

## 2017-07-04 ENCOUNTER — Inpatient Hospital Stay (HOSPITAL_COMMUNITY): Payer: Medicare Other

## 2017-07-04 ENCOUNTER — Encounter (HOSPITAL_COMMUNITY): Payer: Self-pay | Admitting: *Deleted

## 2017-07-04 LAB — GLUCOSE, CAPILLARY
Glucose-Capillary: 134 mg/dL — ABNORMAL HIGH (ref 65–99)
Glucose-Capillary: 125 mg/dL — ABNORMAL HIGH (ref 65–99)
Glucose-Capillary: 113 mg/dL — ABNORMAL HIGH (ref 65–99)
Glucose-Capillary: 114 mg/dL — ABNORMAL HIGH (ref 65–99)
Glucose-Capillary: 115 mg/dL — ABNORMAL HIGH (ref 65–99)

## 2017-07-04 LAB — CBC
HCT: 27.5 % — ABNORMAL LOW (ref 39.0–52.0)
HCT: 26.7 % — ABNORMAL LOW (ref 39.0–52.0)
Hemoglobin: 9.1 g/dL — ABNORMAL LOW (ref 13.0–17.0)
Hemoglobin: 8.7 g/dL — ABNORMAL LOW (ref 13.0–17.0)
MCH: 30.1 pg (ref 26.0–34.0)
MCH: 29.8 pg (ref 26.0–34.0)
MCHC: 33.1 g/dL (ref 30.0–36.0)
MCHC: 32.6 g/dL (ref 30.0–36.0)
MCV: 91.1 fL (ref 78.0–100.0)
MCV: 91.4 fL (ref 78.0–100.0)
Platelets: 90 10*3/uL — ABNORMAL LOW (ref 150–400)
Platelets: 87 10*3/uL — ABNORMAL LOW (ref 150–400)
RBC: 3.02 MIL/uL — ABNORMAL LOW (ref 4.22–5.81)
RBC: 2.92 MIL/uL — ABNORMAL LOW (ref 4.22–5.81)
RDW: 13.5 % (ref 11.5–15.5)
RDW: 13.5 % (ref 11.5–15.5)
WBC: 14 10*3/uL — ABNORMAL HIGH (ref 4.0–10.5)
WBC: 11.5 10*3/uL — ABNORMAL HIGH (ref 4.0–10.5)

## 2017-07-04 LAB — COOXEMETRY PANEL
Carboxyhemoglobin: 1.2 % (ref 0.5–1.5)
Methemoglobin: 1.8 % — ABNORMAL HIGH (ref 0.0–1.5)
O2 Saturation: 68.3 %
Total hemoglobin: 9.5 g/dL — ABNORMAL LOW (ref 12.0–16.0)

## 2017-07-04 LAB — POCT I-STAT, CHEM 8
BUN: 21 mg/dL — ABNORMAL HIGH (ref 6–20)
Calcium, Ion: 1.15 mmol/L (ref 1.15–1.40)
Chloride: 99 mmol/L — ABNORMAL LOW (ref 101–111)
Creatinine, Ser: 1.1 mg/dL (ref 0.61–1.24)
Glucose, Bld: 119 mg/dL — ABNORMAL HIGH (ref 65–99)
HCT: 23 % — ABNORMAL LOW (ref 39.0–52.0)
Hemoglobin: 7.8 g/dL — ABNORMAL LOW (ref 13.0–17.0)
Potassium: 3.8 mmol/L (ref 3.5–5.1)
Sodium: 137 mmol/L (ref 135–145)
TCO2: 25 mmol/L (ref 22–32)

## 2017-07-04 LAB — BASIC METABOLIC PANEL
Anion gap: 5 (ref 5–15)
BUN: 19 mg/dL (ref 6–20)
CO2: 26 mmol/L (ref 22–32)
Calcium: 7.9 mg/dL — ABNORMAL LOW (ref 8.9–10.3)
Chloride: 106 mmol/L (ref 101–111)
Creatinine, Ser: 1.28 mg/dL — ABNORMAL HIGH (ref 0.61–1.24)
GFR calc Af Amer: >60 mL/min (ref >60)
GFR calc non Af Amer: 58 mL/min — ABNORMAL LOW (ref >60)
Glucose, Bld: 130 mg/dL — ABNORMAL HIGH (ref 65–99)
Potassium: 3.7 mmol/L (ref 3.5–5.1)
Sodium: 137 mmol/L (ref 135–145)

## 2017-07-04 MED ORDER — POTASSIUM CHLORIDE 10 MEQ/50ML IV SOLN
10.0000 meq | INTRAVENOUS | Status: AC | PRN
  Administered 2017-07-04 (×3): 10 meq via INTRAVENOUS
  Filled 2017-07-04 (×3): qty 50

## 2017-07-04 MED ORDER — AMIODARONE HCL IN DEXTROSE 360-4.14 MG/200ML-% IV SOLN
30.0000 mg/h | INTRAVENOUS | Status: DC
  Administered 2017-07-05 (×2): 30 mg/h via INTRAVENOUS
  Filled 2017-07-04 (×2): qty 200

## 2017-07-04 MED ORDER — AMIODARONE HCL IN DEXTROSE 360-4.14 MG/200ML-% IV SOLN
INTRAVENOUS | Status: AC
  Administered 2017-07-05: 60 mg/h via INTRAVENOUS
  Filled 2017-07-04: qty 200

## 2017-07-04 MED ORDER — ENOXAPARIN SODIUM 40 MG/0.4ML ~~LOC~~ SOLN
40.0000 mg | SUBCUTANEOUS | Status: DC
  Administered 2017-07-04 – 2017-07-09 (×6): 40 mg via SUBCUTANEOUS
  Filled 2017-07-04 (×6): qty 0.4

## 2017-07-04 MED ORDER — DEXTROSE 5 % IV SOLN
1.5000 g | Freq: Two times a day (BID) | INTRAVENOUS | Status: AC
  Administered 2017-07-04 – 2017-07-05 (×2): 1.5 g via INTRAVENOUS
  Filled 2017-07-04 (×2): qty 1.5

## 2017-07-04 MED ORDER — SODIUM CHLORIDE 0.9 % IV SOLN: 0.3000 ug/kg/h | INTRAVENOUS | Status: DC

## 2017-07-04 MED ORDER — AMIODARONE LOAD VIA INFUSION
150.0000 mg | Freq: Once | INTRAVENOUS | Status: AC
  Administered 2017-07-05: 150 mg via INTRAVENOUS
  Filled 2017-07-04: qty 83.34

## 2017-07-04 MED ORDER — FENTANYL CITRATE (PF) 100 MCG/2ML IJ SOLN
50.0000 ug | INTRAMUSCULAR | Status: DC | PRN
Start: 2017-07-04 — End: 2017-07-05
  Administered 2017-07-04 – 2017-07-05 (×6): 50 ug via INTRAVENOUS
  Filled 2017-07-04 (×6): qty 2

## 2017-07-04 MED ORDER — AMIODARONE HCL IN DEXTROSE 360-4.14 MG/200ML-% IV SOLN
60.0000 mg/h | INTRAVENOUS | Status: AC
  Administered 2017-07-05 (×2): 60 mg/h via INTRAVENOUS
  Filled 2017-07-04: qty 200

## 2017-07-04 NOTE — Op Note (Signed)
NAME:  DESMUND, ELMAN NO.:  [id number]  MEDICAL RECORD NO.:  [id number]  LOCATION:  [facility]                         FACILITY:  [facility]  PHYSICIAN:  Ivin Poot, M.D.  DATE OF BIRTH:  1950/12/18  DATE OF PROCEDURE:  07/02/2017 DATE OF DISCHARGE:                              OPERATIVE REPORT   OPERATION: 1. Biologic Bentall procedure, aortic root replacement with aortic     valve conduit using a 28 mm Valsalva graft and a 25 mm Edwards     pericardial Magna Ease valve with reimplantation of the coronary     arteries and replacement of the ascending aorta to the arch (AVR     with an Edwards model 3300TFX, serial [id number] aortic valve). 2. Hypothermic circulatory arrest with antegrade cerebral perfusion     utilizing right axillary artery cannulation for bypass. 3. Neuroprotective pharmacologic treatment by the Anesthesia team with     topical cooling of the brain and neuroprotective monitoring using     cerebral pulse oximetry.  SURGEON:  Ivin Poot, MD.  ASSISTANT:  Nicholes Rough, PA-C.  ANESTHESIA:  General.  PREOPERATIVE DIAGNOSES: 1. Moderate-to-severe aortic insufficiency with a bicuspid aortic     valve. 2. 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 3. History of chronic fatigue syndrome. 4. Obesity. 5. Sleep apnea. 6. Chronic bronchitis.  POSTOPERATIVE DIAGNOSES: 1. Moderate-to-severe aortic insufficiency with a bicuspid aortic     valve. 2. 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 3. History of chronic fatigue syndrome. 4. Obesity. 5. Sleep apnea. 6. Chronic bronchitis.  CLINICAL NOTE:  The patient is a morbidly obese 66 year old hypertensive male with chronic fatigue syndrome, sleep apnea, and a 5.4 cm fusiform ascending aortic aneurysm, which has documented growth in diameter over the past several months associated with nonspecific chest pain.  He also has a positive family history for aortic dissection - he states  his mother died of a dissection of a thoracic aneurysm.  He was referred from his primary care physician for evaluation of this aneurysm and his aortic valvular disease, which by echo shows moderate-to-severe aortic insufficiency.  Coronary catheterization showed no significant coronary artery disease.  Because of the family history of dissection, the documented increase in size of the ascending aneurysm, and his symptoms of recent onset of new nonspecific chest pain, aortic valve and aortic root and ascending aortic replacement were recommended.  I met with the patient and his wife on several occasions in the office and reviewed the procedure in detail.  He understood that the procedure would take several hours because of the expected need for hypothermic circulatory arrest with cooling then rewarming of his body, he understood that there was risks of stroke, bleeding, blood transfusion requirement, postoperative arrhythmias possibly requiring permanent pacemaker placement, postoperative pulmonary problems requiring prolonged ventilation or treatment of pleural effusions, postoperative infection, organ failure, and death.  He understood that because of his chronic fatigue syndrome - anxiety disorder that his recovery would be probably impacted by his emotional status after surgery.  After reviewing these issues with the patient and his wife, the patient and wife demonstrated their understanding and agreed to proceed with surgery under what I  felt was an informed consent.  OPERATIVE FINDINGS: 1. Severely dilated ascending aorta with a thinned out area laterally,     but no dissection. 2. Extremely dysplastic bicuspid aortic valve with moderate-to-severe     AI, successfully repaired with a 25 mm pericardial valve. 3. Preserved biventricular function after separation from     cardiopulmonary bypass by TEE.  DESCRIPTION OF PROCEDURE:  The patient was brought directly from the preop  holding area, where the patient had been prepared by Anesthesia and informed consent had been obtained.  The patient placed supine on the operating table where general anesthesia was induced.  A transesophageal echo probe was placed by the Anesthesia team.  The patient was prepped and draped as a sterile field.  A proper time-out was performed.  An incision was made beneath the right clavicle for exposure of the right axillary artery.  The pectoralis major was divided in a muscle splitting incision.  The pectoralis minor was divided.  The deep retractor was placed and the axillary artery was carefully dissected from the axillary vein and brachial plexus and surrounded with a vessel loop.  Heparin 4000 units was administered and the axillary artery was clamped proximally and distally with vascular clamps.  An aortotomy was performed and a combined graft-cannula was sewn end-to-side using running 5-0 Prolene.  The suture line was coated with a thin layer of biologic adhesive - Coseal.  The clamps were then removed and there was good hemostasis.  The cannula was carefully placed above the sternum in the sterile field.  A sternotomy was performed.  The sternal retractor was placed using the deep blades due to the patient's obese body habitus.  The pericardium was opened after dividing a large amount of pre pericardial fat.  The heart was inspected.  The ascending aorta was very enlarged.  The right and left ventricles were inspected and found to be without significant scarring and adequately contracting.  TEE showed moderate-to-severe AI. A pursestring was placed in the right atrium for the venous cannula and a second pursestring was placed for the retrograde coronary sinus cardioplegia catheter.  Heparin was administered and the patient was then cannulated and connected to the heart-lung circuit.  The patient was placed on cardiopulmonary bypass.  An LV vent was placed via the right  superior pulmonary vein.  Antegrade and retrograde cardioplegia cannulas were placed in the ascending aorta and right atrium.  The patient was cooled during this period of time with a target core temperature of 22 degrees.  While the patient was being cooled, the innominate artery was dissected from the arch and encircled with a vessel loop.  The aorta was carefully dissected off the pulmonary artery.  The aortic crossclamp was then applied using the large clamp due to the large size of the aorta.  Cardioplegia 1 L was delivered in split doses between the antegrade aortic which did not deliver much cardioplegia due to the aortic insufficiency and most of the cardioplegia was delivered via the retrograde coronary sinus catheter.  There was good cardioplegic arrest and septal temperature dropped less than 14 degrees. Cardioplegia was delivered every 20 minutes while the crossclamp was placed.  The aorta was divided proximal to the cross-clamp.  It was also divided at the sinotubular junction.  The aortic valve was inspected.  It was very dysplastic and not repairable.  It was excised.  The coronary buttons were developed and retracted to the side.  The annulus was debrided of any calcium.  The annulus was sized to a 25 mm pericardial valve.  The super annular 2-0 Ethibond pledgeted sutures were then placed around the annulus.  The valve was prepared according to the protocol and placed inside the 28 mm Valsalva graft.  The valve was tacked to the skirt end of the graft with some interrupted 4-0 Prolene sutures.  The super annular valve sutures of 2-0 Ethibond were then placed through the valve and the Dacron graft together.  The valve conduit was then seated into the annulus, which conformed nicely and all the sutures were tied.  The suture line was coated with a thin layer of Coseal.  Cardioplegia was delivered.  Both antegrade using the coronary sinus catheter and hand-held, soft small  cannulas were placed in the left and right coronary buttons.  The coronary buttons were then sewn onto the graft.  We first attached the left main button to the posterior aspect of the graft after appropriate sized opening was made with the handheld cautery.  This was accomplished with a 5-0 Prolene.  Next, the graft was attached to the right coronary button using a running 5-0 Prolene with an opening created in the anterior aspect of the graft using hand-held electrocautery.  The anastomoses were checked from the inside and found to be without loose suture line.  The patient was then prepared for hypothermic circulatory arrest.  The patient was placed in deep Trendelenburg, and the blood volume was drained to the bypass circuit.  A clamp was placed on the innominate artery at the junction with the arch and the crossclamp was removed. CO2 was insufflated into the surgical field.  A vent was placed in the arch of the aorta.  The aorta was trimmed back to the innominate artery where the aortic diameter was normal.  The Valsalva graft was then cut to the appropriate orientation and length for the end-to-end anastomosis.  This was then completed using first a running 4-0 Prolene for the posterior wall, which was reinforced with interrupted 4-0 pledgeted sutures for hemostasis.  The running 4-0 Prolene was then completed around anteriorly.  The anterior suture line was also reinforced with several interrupted 4-0 Prolene pledgeted sutures.  A small opening was made in the graft and vent was placed.  During the period of circulatory arrest, antegrade cerebral perfusion with flow of 500 mL/minute was achieved by the axillary artery cannula retrograde flow to the cerebral circulation.  After the outflow anastomosis was completed and coated with thin layer of Coseal, flow to the body was reestablished by removing the clamp on the innominate artery at the junction with arch and the patient  was slowly rewarmed.  The suture lines appeared to be hemostatic.  The vent in the graft was used to scavenge any retained air.  The patient required approximately 45 minutes to an hour to reach normothermia.  Temporary pacing wires had been applied.  The patient was cardioverted and resumed a paced rhythm at temperature of 27 degrees. Low-dose inotropes were started, and the lungs were re-expanded and ventilated.  When the patient has been adequately reperfused, rewarmed and the heart was beating normally, the patient was separated from cardiopulmonary bypass.  Echo showed good performance of the aortic valve.  There was good biventricular function.  Hemodynamics were stable.  The LV vent had been removed.  The cardioplegia cannulas had been removed.  The venous cannula was removed and the patient was given a small dose of a test dose of protamine without adverse  reaction.  Protamine was then provided and the patient remained stable.  The combined graft-cannula to the axillary artery was then stapled and divided.  The anastomosis was hemostatic.  We spent a considerable amount of time drying up the operative field. Some bleeding from the right coronary button was controlled with an interrupted 5-0 mattress suture.  There was good biventricular function and no EKG changes of ischemia.  The superior pericardial fat was closed over the aortic graft.  Anterior and posterior mediastinal tubes were placed and brought out through separate incisions.  The sternum was closed with interrupted steel wire. The pectoralis fascia was closed with a running #1 Vicryl.  The subcutaneous and skin layers were closed with running #1 Vicryl.  The right axillary incision was irrigated and closed interrupted #1 Vicryl for the fascia layer, running 2-0 Vicryl for the subcutaneous layer, and skin staples for the skin.  The patient was then prepared for transfer back to the ICU in stable  condition.  Total cardiopulmonary bypass time was 260 minutes with circulatory arrest-antegrade cerebral perfusion time of 38 minutes.     Ivin Poot, M.D.     PV/MEDQ  D:  07/04/2017  T:  07/04/2017  Job:  [id number]  cc:   Wallis Bamberg. [name], MD, [facility] Marchia Bond, M.D.

## 2017-07-04 NOTE — Progress Notes (Signed)
2 Days Post-Op Procedure(s) (LRB): BENTALL PROCEDURE (N/A) TRANSESOPHAGEAL ECHOCARDIOGRAM (TEE) (N/A) Subjective: Progressing  w/medical status but not following sternal precautions and very restless with impulsive movement and constant needs  Objective: Vital signs in last 24 hours: Temp:  [97.7 F (36.5 C)-99 F (37.2 C)] 99 F (37.2 C) (09/15 0730) Pulse Rate:  [70-98] 98 (09/15 0730) Cardiac Rhythm: Normal sinus rhythm (09/14 2000) Resp:  [9-27] 22 (09/15 0730) BP: (82-122)/(51-79) 104/64 (09/15 0730) SpO2:  [91 %-100 %] 100 % (09/15 0730) FiO2 (%):  [40 %] 40 % (09/14 2109) Weight:  [302 lb 0.5 oz (137 kg)] 302 lb 0.5 oz (137 kg) (09/15 0500)  Hemodynamic parameters for last 24 hours: PAP: (27-30)/(11-15) 29/11  Intake/Output from previous day: 09/14 0701 - 09/15 0700 In: 2268 [P.O.:480; I.V.:1238; IV Piggyback:550] Out: 2140 [Urine:1480; Chest Tube:660] Intake/Output this shift: Total I/O In: -  Out: 225 [Urine:225]       Exam    General- alert and comfortable   Lungs- clear without rales, wheezes   Cor- regular rate and rhythm, no murmur , gallop   Abdomen- soft, non-tender   Extremities - warm, non-tender, minimal edema   Neuro- oriented, appropriate, no focal weakness   Lab Results:  Recent Labs  07/03/17 1842 07/04/17 0335  WBC 14.7* 14.0*  HGB 9.0* 9.1*  HCT 26.8* 27.5*  PLT 95* 90*   BMET:  Recent Labs  07/03/17 0322 07/03/17 1837 07/03/17 1842 07/04/17 0335  NA 136 138  --  137  K 4.2 4.1  --  3.7  CL 111 101  --  106  CO2 21*  --   --  26  GLUCOSE 123* 140*  --  130*  BUN 12 18  --  19  CREATININE 1.08 1.00 1.24 1.28*  CALCIUM 7.9*  --   --  7.9*    PT/INR:  Recent Labs  07/02/17 1758  LABPROT 16.6*  INR 1.36   ABG    Component Value Date/Time   PHART 7.458 (H) 07/03/2017 1846   HCO3 27.0 07/03/2017 1846   TCO2 28 07/03/2017 1846   ACIDBASEDEF 6.0 (H) 07/03/2017 0627   O2SAT 68.3 07/04/2017 0340   CBG (last 3)    Recent Labs  07/03/17 1938 07/03/17 2304 07/04/17 0305  GLUCAP 125* 134* 125*    Assessment/Plan: S/P Procedure(s) (LRB): BENTALL PROCEDURE (N/A) TRANSESOPHAGEAL ECHOCARDIOGRAM (TEE) (N/A) Mobilize hold NOAC for now   LOS: 2 days    Tharon Aquas Trigt III 07/04/2017

## 2017-07-04 NOTE — Progress Notes (Signed)
CT surgery p.m. Rounds  Patient remains in sinus rhythm on 2 L nasal cannula P.m. hemoglobin 8.5 Patient was out of bed to chair with assistance Patient against advice got up out of bed, disconnected chest tube from Pleur-evac but did not fall Continues to be impulsive, not listing the recommendations from caregivers, constantly  pushing and pulling himself in bed despite reassurance, presence of a sitter and family members, and efforts at providing a caring environment

## 2017-07-04 NOTE — Progress Notes (Signed)
Pt has been anxious, restless, and impulsive throughout night. He will have moments of sleep while when he wakes up, he is pulling and pushing with him arms. RN repeatedly educated patient on using the heart pillow to decrease risk of damage to sternal incision. There is no evidence of learning. Pt continues to pull at CPAP and nasal cannula. Refusing to wear them. PRN medications given with brief relief, but when patient awakes, he continues to be anxious, restless, and impulsive. He has remained oriented throughout night.

## 2017-07-04 NOTE — Progress Notes (Signed)
Pt got out of bed without assistance despite RN's advice to not do so. Pt found standing at bedside, pulling at IJ Sleeve. Mediastinal tubing had disconnected from suction tubing. Both MT's were immediately clamped and reattached to suction tubing and reinforced with tape. Other RN's present to assist pt back to bed. Pt reoriented to safety instructions following open heart surgery, including sternal precautions. Pt given IV Ativan for anxiety and sitter requested for 1:1 supervision. Chest X-ray ordered to check MT placement. Dr. Prescott Gum paged and made aware of incident. Orders for 1:1 sitter and non-violent restraints received. Will continue to reorient and redirect pt and remind him of safety precautions.

## 2017-07-05 ENCOUNTER — Inpatient Hospital Stay (HOSPITAL_COMMUNITY): Payer: Medicare Other

## 2017-07-05 LAB — CBC
HCT: 26.7 % — ABNORMAL LOW (ref 39.0–52.0)
Hemoglobin: 8.8 g/dL — ABNORMAL LOW (ref 13.0–17.0)
MCH: 30.2 pg (ref 26.0–34.0)
MCHC: 33 g/dL (ref 30.0–36.0)
MCV: 91.8 fL (ref 78.0–100.0)
Platelets: 78 10*3/uL — ABNORMAL LOW (ref 150–400)
RBC: 2.91 MIL/uL — ABNORMAL LOW (ref 4.22–5.81)
RDW: 13.6 % (ref 11.5–15.5)
WBC: 9.1 10*3/uL (ref 4.0–10.5)

## 2017-07-05 LAB — POCT I-STAT, CHEM 8
BUN: 24 mg/dL — ABNORMAL HIGH (ref 6–20)
Calcium, Ion: 1.17 mmol/L (ref 1.15–1.40)
Chloride: 98 mmol/L — ABNORMAL LOW (ref 101–111)
Creatinine, Ser: 1.1 mg/dL (ref 0.61–1.24)
Glucose, Bld: 102 mg/dL — ABNORMAL HIGH (ref 65–99)
HCT: 27 % — ABNORMAL LOW (ref 39.0–52.0)
Hemoglobin: 9.2 g/dL — ABNORMAL LOW (ref 13.0–17.0)
Potassium: 3.6 mmol/L (ref 3.5–5.1)
Sodium: 138 mmol/L (ref 135–145)
TCO2: 26 mmol/L (ref 22–32)

## 2017-07-05 LAB — BASIC METABOLIC PANEL
Anion gap: 4 — ABNORMAL LOW (ref 5–15)
BUN: 20 mg/dL (ref 6–20)
CO2: 28 mmol/L (ref 22–32)
Calcium: 7.9 mg/dL — ABNORMAL LOW (ref 8.9–10.3)
Chloride: 104 mmol/L (ref 101–111)
Creatinine, Ser: 1.07 mg/dL (ref 0.61–1.24)
GFR calc Af Amer: >60 mL/min (ref >60)
GFR calc non Af Amer: >60 mL/min (ref >60)
Glucose, Bld: 117 mg/dL — ABNORMAL HIGH (ref 65–99)
Potassium: 3.8 mmol/L (ref 3.5–5.1)
Sodium: 136 mmol/L (ref 135–145)

## 2017-07-05 LAB — COOXEMETRY PANEL
Carboxyhemoglobin: 1.1 % (ref 0.5–1.5)
Methemoglobin: 1.6 % — ABNORMAL HIGH (ref 0.0–1.5)
O2 Saturation: 57.8 %
Total hemoglobin: 7.2 g/dL — ABNORMAL LOW (ref 12.0–16.0)

## 2017-07-05 LAB — GLUCOSE, CAPILLARY: Glucose-Capillary: 91 mg/dL (ref 65–99)

## 2017-07-05 MED ORDER — POTASSIUM CHLORIDE 10 MEQ/50ML IV SOLN
10.0000 meq | INTRAVENOUS | Status: AC
  Administered 2017-07-05 (×2): 10 meq via INTRAVENOUS
  Filled 2017-07-05 (×2): qty 50

## 2017-07-05 MED ORDER — ORAL CARE MOUTH RINSE: 15.0000 mL | Freq: Two times a day (BID) | OROMUCOSAL | Status: DC

## 2017-07-05 MED ORDER — WARFARIN SODIUM 5 MG PO TABS
5.0000 mg | ORAL_TABLET | Freq: Every day | ORAL | Status: DC
  Administered 2017-07-05 – 2017-07-06 (×2): 5 mg via ORAL
  Filled 2017-07-05 (×2): qty 1

## 2017-07-05 MED ORDER — POTASSIUM CHLORIDE 10 MEQ/50ML IV SOLN
10.0000 meq | Freq: Once | INTRAVENOUS | Status: AC
  Administered 2017-07-05: 10 meq via INTRAVENOUS
  Filled 2017-07-05: qty 50

## 2017-07-05 MED ORDER — LEVALBUTEROL TARTRATE 45 MCG/ACT IN AERO: 2.0000 | INHALATION_SPRAY | RESPIRATORY_TRACT | Status: DC | PRN

## 2017-07-05 MED ORDER — FENTANYL CITRATE (PF) 100 MCG/2ML IJ SOLN
50.0000 ug | INTRAMUSCULAR | Status: DC | PRN
  Administered 2017-07-05 – 2017-07-06 (×4): 50 ug via INTRAVENOUS
  Filled 2017-07-05 (×4): qty 2

## 2017-07-05 MED ORDER — DEXMEDETOMIDINE HCL IN NACL 400 MCG/100ML IV SOLN
0.3000 ug/kg/h | INTRAVENOUS | Status: DC
  Administered 2017-07-05 (×2): 0.7 ug/kg/h via INTRAVENOUS
  Administered 2017-07-05: 0.2 ug/kg/h via INTRAVENOUS
  Filled 2017-07-05 (×2): qty 100

## 2017-07-05 MED ORDER — WARFARIN - PHYSICIAN DOSING INPATIENT
Freq: Every day | Status: DC
  Administered 2017-07-05 – 2017-07-11 (×6)

## 2017-07-05 MED ORDER — ALBUTEROL SULFATE (2.5 MG/3ML) 0.083% IN NEBU
2.5000 mg | INHALATION_SOLUTION | RESPIRATORY_TRACT | Status: DC | PRN
  Administered 2017-07-12: 2.5 mg via RESPIRATORY_TRACT
  Filled 2017-07-05: qty 3

## 2017-07-05 MED ORDER — CHLORHEXIDINE GLUCONATE 0.12 % MT SOLN
15.0000 mL | Freq: Two times a day (BID) | OROMUCOSAL | Status: DC
  Administered 2017-07-05 – 2017-07-06 (×2): 15 mL via OROMUCOSAL
  Filled 2017-07-05 (×2): qty 15

## 2017-07-05 NOTE — Plan of Care (Signed)
Problem: Coping: Goal: Ability to adjust to condition or change in health will improve Outcome: Not Progressing Pt anxious, agitated and combative requiring restarting of Precedex drip and addition of Ativan and fentanyl IVP PRN per orders. Wife expresses frustration over Pt behavior, reassurance provided. By morning, Pt slept well for 5 hours and awoke calmer, not pulling at lines/tubes. Will continue to monitor/assess.  Problem: Respiratory: Goal: Levels of oxygenation will improve Outcome: Progressing After Pt calm on PRN medications, cooperative top wearing home CPAP nasal trumpet. Saturations improved as well as breath sounds.

## 2017-07-05 NOTE — Evaluation (Signed)
Physical Therapy Evaluation Patient Details Name: Grant [name] MRN: [id number] DOB: 1950-11-28 Today's Date: 07/05/2017   History of Present Illness  66 year old hypertensive male with chronic fatigue syndrome and sleep apnea with a 5.4 cm aortic root aneurysm with moderate aortic insuficiency and bicuspid valve with needs a aortic root replacement plan for a biologic-Bentall procedure, which was done 9/13; recovery has been complicated by some delirium  Clinical Impression   Patient is s/p above surgery resulting in functional limitations due to the deficits listed below (see PT Problem List). Grant [name] was completely independent community ambulator prior t this admission; as he improves medically, I anticipate good progress;  Patient will benefit from skilled PT to increase their independence and safety with mobility to allow discharge to the venue listed below.       Follow Up Recommendations Home health PT;Supervision/Assistance - 24 hour    Equipment Recommendations  Rolling walker with 5" wheels;3in1 (PT)    Recommendations for Other Services OT consult     Precautions / Restrictions Precautions Precautions: Fall;Sternal      Mobility  Bed Mobility Overal bed mobility: Needs Assistance Bed Mobility: Supine to Sit     Supine to sit: Mod assist     General bed mobility comments: Used bed to elevate head and torso to near upright; cues for technqiue, and mod assist to help LEs to clear EOB; heavy mod assist to come off HOB to full upright sitting and used bed pad to square off hips at EOB  Transfers Overall transfer level: Needs assistance Equipment used: 1 person hand held assist (2nd person managing lines) Transfers: Sit to/from Stand Sit to Stand: Min assist         General transfer comment: Min assist to steady; good power up, albeit impulsive; sat impulsively as well  Ambulation/Gait Ambulation/Gait assistance: Min assist;+2 safety/equipment Ambulation  Distance (Feet):  (pivot steps bed to recliner) Assistive device: 1 person hand held assist       General Gait Details: Cues for safety; close watch of lines, leads, especially chest tube  Stairs            Wheelchair Mobility    Modified Rankin (Stroke Patients Only)       Balance Overall balance assessment: Needs assistance   Sitting balance-Leahy Scale: Fair       Standing balance-Leahy Scale: Poor                               Pertinent Vitals/Pain Pain Assessment: Faces Faces Pain Scale: Hurts little more Pain Location: grimace with cough Pain Descriptors / Indicators: Grimacing Pain Intervention(s): Monitored during session;Other (comment) (encouraged pillow splinting)    Home Living Family/patient expects to be discharged to:: Private residence Living Arrangements: Spouse/significant other Available Help at Discharge: Family;Available 24 hours/day Type of Home: House Home Access: Stairs to enter Entrance Stairs-Rails: Psychiatric nurse of Steps: 4 (large steps to the back of the house) Home Layout: Two level Home Equipment: Shower seat;Other (comment) (lift chair) Additional Comments: To manage how he normally does at home, he will need to be a bel to stand, walk across den, up 3 steps, down hallway to bathroom    Prior Function Level of Independence: Independent               Hand Dominance        Extremity/Trunk Assessment   Upper Extremity Assessment Upper Extremity Assessment: Defer to OT  evaluation    Lower Extremity Assessment Lower Extremity Assessment: Generalized weakness (postop; noting good power up)       Communication   Communication: No difficulties  Cognition Arousal/Alertness: Lethargic (but arousable) Behavior During Therapy: WFL for tasks assessed/performed;Flat affect (eyes closed most of session) Overall Cognitive Status: Impaired/Different from baseline Area of Impairment:  Attention;Safety/judgement;Problem solving                   Current Attention Level: Sustained     Safety/Judgement: Decreased awareness of safety;Decreased awareness of deficits   Problem Solving: Slow processing;Requires verbal cues;Requires tactile cues General Comments: Easily distractible; seemed to get overwhelmed with stimulation      General Comments General comments (skin integrity, edema, etc.): RN present and VSS throughout session    Exercises     Assessment/Plan    PT Assessment Patient needs continued PT services  PT Problem List Decreased strength;Decreased activity tolerance;Decreased balance;Decreased mobility;Decreased coordination;Decreased cognition;Decreased knowledge of use of DME;Decreased safety awareness;Decreased knowledge of precautions;Cardiopulmonary status limiting activity;Obesity;Pain       PT Treatment Interventions DME instruction;Gait training;Stair training;Functional mobility training;Therapeutic activities;Therapeutic exercise;Balance training;Patient/family education    PT Goals (Current goals can be found in the Care Plan section)  Acute Rehab PT Goals Patient Stated Goal: did not state; Wife hopes he can be home safely soon PT Goal Formulation: With family Time For Goal Achievement: 07/19/17 Potential to Achieve Goals: Good    Frequency Min 3X/week   Barriers to discharge        Co-evaluation               AM-PAC PT "6 Clicks" Daily Activity  Outcome Measure Difficulty turning over in bed (including adjusting bedclothes, sheets and blankets)?: Unable Difficulty moving from lying on back to sitting on the side of the bed? : Unable Difficulty sitting down on and standing up from a chair with arms (e.g., wheelchair, bedside commode, etc,.)?: A Lot Help needed moving to and from a bed to chair (including a wheelchair)?: A Lot Help needed walking in hospital room?: A Lot Help needed climbing 3-5 steps with a railing? :  A Lot 6 Click Score: 10    End of Session Equipment Utilized During Treatment: Oxygen Activity Tolerance: Patient tolerated treatment well Patient left: in chair;with call bell/phone within reach;with chair alarm set;with restraints reapplied;with family/visitor present Nurse Communication: Mobility status PT Visit Diagnosis: Unsteadiness on feet (R26.81);Other abnormalities of gait and mobility (R26.89)    Time: 9833-8250 PT Time Calculation (min) (ACUTE ONLY): 15 min   Charges:   PT Evaluation $PT Eval Moderate Complexity: 1 Mod     PT G Codes:        Roney Marion, PT  Acute Rehabilitation Services Pager [phone] Office [phone]   Colletta Maryland 07/05/2017, 1:51 PM

## 2017-07-05 NOTE — Progress Notes (Signed)
Neb treatments and inhalers held at this time. Patient sleeping and wife requesting patient not to be bothered. Instructed wife to call and inform RT when patient was awake and treatments will be given at that time.

## 2017-07-05 NOTE — Plan of Care (Signed)
Problem: Respiratory: Goal: Levels of oxygenation will improve Outcome: Progressing Patient maintaining O2 sats on 2L O2 to Room Air

## 2017-07-05 NOTE — Progress Notes (Signed)
RT placed pt on cpap with home nasal pillows. Pressure of 15 and 2L bled in. Pt tolerating well. RT will cont to mont.

## 2017-07-05 NOTE — Progress Notes (Signed)
3 Days Post-Op Procedure(s) (LRB): BENTALL PROCEDURE (N/A) TRANSESOPHAGEAL ECHOCARDIOGRAM (TEE) (N/A) Subjective:sleeping on CPAP afib on IV amio- will start coumadin, daily INR Co-ox .58 on low dose mil .125- cont Objective: Vital signs in last 24 hours: Temp:  [97.4 F (36.3 C)-98.7 F (37.1 C)] 97.4 F (36.3 C) (09/16 0317) Pulse Rate:  [71-116] 76 (09/16 0800) Cardiac Rhythm: Atrial fibrillation (09/15 2000) Resp:  [15-25] 22 (09/16 0800) BP: (86-128)/(60-86) 93/71 (09/16 0800) SpO2:  [92 %-100 %] 97 % (09/16 0800) Weight:  [290 lb 2 oz (131.6 kg)] 290 lb 2 oz (131.6 kg) (09/16 0500)  Hemodynamic parameters for last 24 hours:  stable  Intake/Output from previous day: 09/15 0701 - 09/16 0700 In: 1796.5 [P.O.:60; I.V.:1186.5; IV Piggyback:550] Out: 1781 [Urine:1650; Stool:1; Chest Tube:130] Intake/Output this shift: No intake/output data recorded.       Exam    General- alert and comfortable   Lungs- clear without rales, wheezes   Cor- regular rate and rhythm, no murmur , gallop   Abdomen- soft, non-tender   Extremities - warm, non-tender, minimal edema   Neuro- oriented, appropriate, no focal weakness   Lab Results:  Recent Labs  07/04/17 1820 07/05/17 0339  WBC 11.5* 9.1  HGB 8.7* 8.8*  HCT 26.7* 26.7*  PLT 87* 78*   BMET:  Recent Labs  07/04/17 0335 07/04/17 1553 07/05/17 0339  NA 137 137 136  K 3.7 3.8 3.8  CL 106 99* 104  CO2 26  --  28  GLUCOSE 130* 119* 117*  BUN 19 21* 20  CREATININE 1.28* 1.10 1.07  CALCIUM 7.9*  --  7.9*    PT/INR:  Recent Labs  07/02/17 1758  LABPROT 16.6*  INR 1.36   ABG    Component Value Date/Time   PHART 7.458 (H) 07/03/2017 1846   HCO3 27.0 07/03/2017 1846   TCO2 25 07/04/2017 1553   ACIDBASEDEF 6.0 (H) 07/03/2017 0627   O2SAT 57.8 07/05/2017 0355   CBG (last 3)   Recent Labs  07/04/17 1921 07/04/17 2310 07/05/17 0354  GLUCAP 114* 115* 91    Assessment/Plan: S/P Procedure(s) (LRB): BENTALL  PROCEDURE (N/A) TRANSESOPHAGEAL ECHOCARDIOGRAM (TEE) (N/A) Cont to push postop mobility but control his impulsive unsafe actions with judicious use of fentanyl, ativan, precedex Needs to have neck line removed for risk of infection  LOS: 3 days    Grant [name] 07/05/2017

## 2017-07-05 NOTE — Plan of Care (Signed)
Problem: Activity: Goal: Risk for activity intolerance will decrease Outcome: Progressing Patient able to tolerate sitting in chair and use of BSC  Problem: Nutritional: Goal: Risk for body nutrition deficit will decrease Outcome: Progressing Patient adv diet to full liquid

## 2017-07-05 NOTE — Progress Notes (Signed)
RT placed pt on cpap with pressure of 15, pts own home nasal pillows and 4L bled in. Pt tolerating well. RT to cont to monitor.

## 2017-07-05 NOTE — Progress Notes (Signed)
PT Cancellation Note  Patient Details Name: Grant [name] MRN: [id number] DOB: 1951/07/20   Cancelled Treatment:    Reason Eval/Treat Not Completed: Other (comment)   Discussed Mr. Weisensel case with Janett Billow, RN;  He is currently resting, on cpap;   Currently sedated;   Will follow up later today as time allows;  Otherwise, will follow up for PT tomorrow;   Thank you,  Roney Marion, PT  Acute Rehabilitation Services Pager [phone] Office [phone]     Colletta Maryland 07/05/2017, 9:09 AM

## 2017-07-06 ENCOUNTER — Inpatient Hospital Stay (HOSPITAL_COMMUNITY): Payer: Medicare Other

## 2017-07-06 LAB — TYPE AND SCREEN
ABO/RH(D): A POS
Antibody Screen: NEGATIVE
Unit division: 0
Unit division: 0
Unit division: 0
Unit division: 0

## 2017-07-06 LAB — BPAM RBC
Blood Product Expiration Date: 201810022359
Blood Product Expiration Date: 201810022359
Blood Product Expiration Date: 201810022359
Blood Product Expiration Date: 201810022359
ISSUE DATE / TIME: 201809111332
ISSUE DATE / TIME: 201809111337
ISSUE DATE / TIME: 201809130844
ISSUE DATE / TIME: 201809150349
Unit Type and Rh: 6200
Unit Type and Rh: 6200
Unit Type and Rh: 6200
Unit Type and Rh: 6200

## 2017-07-06 LAB — POCT I-STAT, CHEM 8
BUN: 16 mg/dL (ref 6–20)
Calcium, Ion: 1.2 mmol/L (ref 1.15–1.40)
Chloride: 100 mmol/L — ABNORMAL LOW (ref 101–111)
Creatinine, Ser: 0.8 mg/dL (ref 0.61–1.24)
Glucose, Bld: 108 mg/dL — ABNORMAL HIGH (ref 65–99)
HCT: 24 % — ABNORMAL LOW (ref 39.0–52.0)
Hemoglobin: 8.2 g/dL — ABNORMAL LOW (ref 13.0–17.0)
Potassium: 3.7 mmol/L (ref 3.5–5.1)
Sodium: 139 mmol/L (ref 135–145)
TCO2: 28 mmol/L (ref 22–32)

## 2017-07-06 LAB — BASIC METABOLIC PANEL
Anion gap: 6 (ref 5–15)
BUN: 19 mg/dL (ref 6–20)
CO2: 27 mmol/L (ref 22–32)
Calcium: 8.1 mg/dL — ABNORMAL LOW (ref 8.9–10.3)
Chloride: 104 mmol/L (ref 101–111)
Creatinine, Ser: 1.16 mg/dL (ref 0.61–1.24)
GFR calc Af Amer: >60 mL/min (ref >60)
GFR calc non Af Amer: >60 mL/min (ref >60)
Glucose, Bld: 93 mg/dL (ref 65–99)
Potassium: 3.3 mmol/L — ABNORMAL LOW (ref 3.5–5.1)
Sodium: 137 mmol/L (ref 135–145)

## 2017-07-06 LAB — CBC
HCT: 26.5 % — ABNORMAL LOW (ref 39.0–52.0)
Hemoglobin: 8.8 g/dL — ABNORMAL LOW (ref 13.0–17.0)
MCH: 30.7 pg (ref 26.0–34.0)
MCHC: 33.2 g/dL (ref 30.0–36.0)
MCV: 92.3 fL (ref 78.0–100.0)
Platelets: 103 10*3/uL — ABNORMAL LOW (ref 150–400)
RBC: 2.87 MIL/uL — ABNORMAL LOW (ref 4.22–5.81)
RDW: 13.9 % (ref 11.5–15.5)
WBC: 9.6 10*3/uL (ref 4.0–10.5)

## 2017-07-06 LAB — PROTIME-INR
INR: 1.21
Prothrombin Time: 15.2 seconds (ref 11.4–15.2)

## 2017-07-06 LAB — COOXEMETRY PANEL
Carboxyhemoglobin: 1.7 % — ABNORMAL HIGH (ref 0.5–1.5)
Methemoglobin: 1.2 % (ref 0.0–1.5)
O2 Saturation: 63.7 %
Total hemoglobin: 8.2 g/dL — ABNORMAL LOW (ref 12.0–16.0)

## 2017-07-06 LAB — GLUCOSE, CAPILLARY: Glucose-Capillary: <10 mg/dL — CL (ref 65–99)

## 2017-07-06 MED ORDER — CARVEDILOL 3.125 MG PO TABS
3.1250 mg | ORAL_TABLET | Freq: Two times a day (BID) | ORAL | Status: DC
  Administered 2017-07-06 (×2): 3.125 mg via ORAL
  Filled 2017-07-06 (×2): qty 1

## 2017-07-06 MED ORDER — POTASSIUM CHLORIDE 10 MEQ/50ML IV SOLN
10.0000 meq | INTRAVENOUS | Status: AC
  Administered 2017-07-06 (×3): 10 meq via INTRAVENOUS

## 2017-07-06 MED ORDER — POTASSIUM CHLORIDE 10 MEQ/50ML IV SOLN
10.0000 meq | INTRAVENOUS | Status: DC | PRN
  Administered 2017-07-07 (×2): 10 meq via INTRAVENOUS
  Filled 2017-07-06 (×4): qty 50

## 2017-07-06 MED ORDER — ORAL CARE MOUTH RINSE: 15.0000 mL | Freq: Two times a day (BID) | OROMUCOSAL | Status: DC

## 2017-07-06 MED ORDER — POTASSIUM CHLORIDE 10 MEQ/50ML IV SOLN
10.0000 meq | INTRAVENOUS | Status: AC
  Administered 2017-07-06 (×2): 10 meq via INTRAVENOUS
  Filled 2017-07-06 (×3): qty 50

## 2017-07-06 MED ORDER — FENTANYL CITRATE (PF) 100 MCG/2ML IJ SOLN
50.0000 ug | Freq: Four times a day (QID) | INTRAMUSCULAR | Status: DC | PRN
  Administered 2017-07-06 (×2): 50 ug via INTRAVENOUS
  Filled 2017-07-06 (×2): qty 2

## 2017-07-06 MED ORDER — POTASSIUM CHLORIDE 10 MEQ/50ML IV SOLN
10.0000 meq | INTRAVENOUS | Status: AC
  Administered 2017-07-06 (×3): 10 meq via INTRAVENOUS
  Filled 2017-07-06 (×3): qty 50

## 2017-07-06 MED ORDER — SODIUM CHLORIDE 0.9% FLUSH
10.0000 mL | Freq: Two times a day (BID) | INTRAVENOUS | Status: DC
  Administered 2017-07-06 – 2017-07-10 (×5): 10 mL

## 2017-07-06 MED ORDER — LORAZEPAM 2 MG/ML IJ SOLN
0.5000 mg | Freq: Four times a day (QID) | INTRAMUSCULAR | Status: DC | PRN
  Administered 2017-07-06 (×2): 0.5 mg via INTRAVENOUS
  Filled 2017-07-06 (×2): qty 1

## 2017-07-06 MED ORDER — LEVALBUTEROL HCL 1.25 MG/0.5ML IN NEBU
1.2500 mg | INHALATION_SOLUTION | Freq: Three times a day (TID) | RESPIRATORY_TRACT | Status: DC
  Administered 2017-07-07 (×3): 1.25 mg via RESPIRATORY_TRACT
  Filled 2017-07-06 (×4): qty 0.5

## 2017-07-06 MED ORDER — AMIODARONE HCL 200 MG PO TABS
400.0000 mg | ORAL_TABLET | Freq: Two times a day (BID) | ORAL | Status: DC
  Administered 2017-07-06 – 2017-07-08 (×5): 400 mg via ORAL
  Filled 2017-07-06 (×5): qty 2

## 2017-07-06 MED ORDER — POTASSIUM CHLORIDE CRYS ER 20 MEQ PO TBCR
20.0000 meq | EXTENDED_RELEASE_TABLET | Freq: Two times a day (BID) | ORAL | Status: DC
  Administered 2017-07-06 – 2017-07-12 (×13): 20 meq via ORAL
  Filled 2017-07-06 (×13): qty 1

## 2017-07-06 MED ORDER — SODIUM CHLORIDE 0.9% FLUSH
10.0000 mL | INTRAVENOUS | Status: DC | PRN
  Administered 2017-07-08: 10 mL
  Administered 2017-07-09: 20 mL
  Administered 2017-07-11 (×2): 10 mL
  Filled 2017-07-06 (×4): qty 40

## 2017-07-06 MED ORDER — CHLORHEXIDINE GLUCONATE CLOTH 2 % EX PADS
6.0000 | MEDICATED_PAD | Freq: Every day | CUTANEOUS | Status: DC
  Administered 2017-07-06 – 2017-07-11 (×6): 6 via TOPICAL

## 2017-07-06 NOTE — Plan of Care (Signed)
Problem: Cardiac: Goal: Hemodynamic stability will improve Outcome: Progressing Pt noted to have converted back to NSR with 1 degree AV block after initiation of Amiodarone protocol per MD order. BP stable with SBP 110-140.  Problem: Coping: Goal: Ability to adjust to condition or change in health will improve Outcome: Progressing Pt neuro status showing improvement with improved cooperation/ verbally redirected easier. No long attempting to get OOB independently as frequently. OOB to chair with 2 person assist. A&O x 4 with blunted affect.

## 2017-07-06 NOTE — Progress Notes (Signed)
Patient ID: Grant [name], male   DOB: 01/21/1951, 66 y.o.   MRN: [id number] EVENING ROUNDS NOTE :     [address]       [place] [address]             [phone]                 4 Days Post-Op Procedure(s) (LRB): BENTALL PROCEDURE (N/A) TRANSESOPHAGEAL ECHOCARDIOGRAM (TEE) (N/A)  Total Length of Stay:  LOS: 4 days  BP 140/78   Pulse 80   Temp 98.8 F (37.1 C) (Oral)   Resp (!) 28   Ht 5\' 11"  (1.803 m)   Wt 285 lb 0.9 oz (129.3 kg)   SpO2 98%   BMI 39.76 kg/m   .Intake/Output      09/16 0701 - 09/17 0700 09/17 0701 - 09/18 0700   P.O. 2090    I.V. (mL/kg) 939 (7.3) 183.4 (1.4)   IV Piggyback 300 200   Total Intake(mL/kg) 3329 (25.7) 383.4 (3)   Urine (mL/kg/hr) 1575 (0.5) 2235 (1.6)   Chest Tube 310    Total Output 1885 2235   Net +1444 -1851.6        Urine Occurrence 2 x 303 x   Stool Occurrence 1 x      . sodium chloride Stopped (07/03/17 0900)  . lactated ringers 10 mL/hr at 07/06/17 1600  . lactated ringers Stopped (07/02/17 1730)  . potassium chloride    . potassium chloride Stopped (07/06/17 1737)     Lab Results  Component Value Date   WBC 9.6 07/06/2017   HGB 8.2 (L) 07/06/2017   HCT 24.0 (L) 07/06/2017   PLT 103 (L) 07/06/2017   GLUCOSE 108 (H) 07/06/2017   CHOL 184 05/05/2017   TRIG 225 (H) 05/05/2017   HDL 45 05/05/2017   LDLCALC 94 05/05/2017   ALT 24 06/30/2017   AST 26 06/30/2017   NA 139 07/06/2017   K 3.7 07/06/2017   CL 100 (L) 07/06/2017   CREATININE 0.80 07/06/2017   BUN 16 07/06/2017   CO2 27 07/06/2017   TSH 1.670 05/05/2017   INR 1.21 07/06/2017   HGBA1C 5.5 06/30/2017   Patient confused  ot came today, PT pending, has not walked yet   One chest tube in on suction   Grace Isaac MD  Beeper [phone] Office [phone] 07/06/2017 6:04 PM

## 2017-07-06 NOTE — Progress Notes (Signed)
Peripherally Inserted Central Catheter/Midline Placement  The IV Nurse has discussed with the patient and/or persons authorized to consent for the patient, the purpose of this procedure and the potential benefits and risks involved with this procedure.  The benefits include less needle sticks, lab draws from the catheter, and the patient may be discharged home with the catheter. Risks include, but not limited to, infection, bleeding, blood clot (thrombus formation), and puncture of an artery; nerve damage and irregular heartbeat and possibility to perform a PICC exchange if needed/ordered by physician.  Alternatives to this procedure were also discussed.  Bard Power PICC patient education guide, fact sheet on infection prevention and patient information card has been provided to patient /or left at bedside.    PICC/Midline Placement Documentation        Alara Daniel, Nicolette Bang 07/06/2017, 3:19 PM

## 2017-07-06 NOTE — Care Management Note (Addendum)
Case Management Note  Patient Details  Name: Grant [name] MRN: [id number] Date of Birth: 1950-12-24  Subjective/Objective: From home with spouse, pta indep, he has a lift chair, and a cpap machine at home, he is  post op Bentall procedure, had some delerim last pm, now extubated conts on milrinone, levophed, cont insulin, lasix, iv abx and precedex.  Wife is retired she will be with patient at discharge 24/7.    9/17 [place], BSN-  one chest tube dc'd today, still with a chest tube to suction,he has Sub Q air in chest and face, picc placed today.  NCM spoke with wife, Grant [name] , gave her the [facility] agency list to choose from for [place], [place], she would like to look at the list and let me know later.  She states she would like to go thru [facility] for the DME, 3 n 1 and rolling walker.     9/19 [place], BSN - received call from wife, she chose [facility] for HHPT/HHOT , referral given to Butch Penny, also wife requested a calm person to work with her spouse because of chronic fatigue, this information was given to Butch Penny, wife prefers [facility] to speak with her and not the patient to get things set up.  He will also need 3 n 1 and walker . [facility] will not be able to provide blue pads for patient.                                Action/Plan: NCM will follow for dc needs.   Expected Discharge Date:  07/09/17               Expected Discharge Plan:     In-House Referral:     Discharge planning Services  CM Consult  Post Acute Care Choice:    Choice offered to:     DME Arranged:    DME Agency:     HH Arranged:    HH Agency:     Status of Service:  In process, will continue to follow  If discussed at Long Length of Stay Meetings, dates discussed:    Additional Comments:  Zenon Mayo, RN 07/06/2017, 3:42 PM

## 2017-07-06 NOTE — Progress Notes (Signed)
4 Days Post-Op Procedure(s) (LRB): BENTALL PROCEDURE (N/A) TRANSESOPHAGEAL ECHOCARDIOGRAM (TEE) (N/A) Subjective: Mental status- agitation gradually improved Now with mild sub-Q air, chest tube in place from surgery Central line removed- ewaned off low dose milrinone Objective: Vital signs in last 24 hours: Temp:  [97.9 F (36.6 C)-98.8 F (37.1 C)] 98.8 F (37.1 C) (09/17 1100) Pulse Rate:  [73-119] 78 (09/17 1400) Cardiac Rhythm: Normal sinus rhythm;Heart block (09/17 0800) Resp:  [18-26] 24 (09/17 1400) BP: (91-139)/(56-124) 117/77 (09/17 1400) SpO2:  [91 %-100 %] 96 % (09/17 1400) FiO2 (%):  [28 %] 28 % (09/17 0731) Weight:  [285 lb 0.9 oz (129.3 kg)] 285 lb 0.9 oz (129.3 kg) (09/17 0500)  Hemodynamic parameters for last 24 hours:  nsr  Intake/Output from previous day: 09/16 0701 - 09/17 0700 In: 3329 [P.O.:2090; I.V.:939; IV Piggyback:300] Out: 2620 [Urine:1575; Chest Tube:310] Intake/Output this shift: Total I/O In: 253.4 [I.V.:153.4; IV Piggyback:100] Out: 1510 [Urine:1510]       Exam    General- alert and comfortable   Lungs- clear without rales, wheezes   Cor- regular rate and rhythm, no murmur , gallop   Abdomen- soft, non-tender   Extremities - warm, non-tender, minimal edema   Neuro- oriented, appropriate, no focal weakness   Lab Results:  Recent Labs  07/05/17 0339 07/05/17 1615 07/06/17 0338  WBC 9.1  --  9.6  HGB 8.8* 9.2* 8.8*  HCT 26.7* 27.0* 26.5*  PLT 78*  --  103*   BMET:  Recent Labs  07/05/17 0339 07/05/17 1615 07/06/17 0338  NA 136 138 137  K 3.8 3.6 3.3*  CL 104 98* 104  CO2 28  --  27  GLUCOSE 117* 102* 93  BUN 20 24* 19  CREATININE 1.07 1.10 1.16  CALCIUM 7.9*  --  8.1*    PT/INR:  Recent Labs  07/06/17 0338  LABPROT 15.2  INR 1.21   ABG    Component Value Date/Time   PHART 7.458 (H) 07/03/2017 1846   HCO3 27.0 07/03/2017 1846   TCO2 26 07/05/2017 1615   ACIDBASEDEF 6.0 (H) 07/03/2017 0627   O2SAT 63.7  07/06/2017 0400   CBG (last 3)   Recent Labs  07/04/17 1921 07/04/17 2310 07/05/17 0354  GLUCAP 114* 115* 91    Assessment/Plan: S/P Procedure(s) (LRB): BENTALL PROCEDURE (N/A) TRANSESOPHAGEAL ECHOCARDIOGRAM (TEE) (N/A) leave chest tube for subQ air, small air leak Ambulate Start coumadin for intermittent afib  LOS: 4 days    Tharon Aquas Trigt III 07/06/2017

## 2017-07-06 NOTE — Evaluation (Addendum)
Occupational Therapy Evaluation Patient Details Name: Grant [name] MRN: [id number] DOB: 03/20/1951 Today's Date: 07/06/2017    History of Present Illness 66 year old hypertensive male with chronic fatigue syndrome and sleep apnea with a 5.4 cm aortic root aneurysm with moderate aortic insuficiency and bicuspid valve with needs a aortic root replacement plan for a biologic-Bentall procedure, which was done 9/13; recovery has been complicated by some delirium   Clinical Impression   This 66 yo male admitted and underwent above presents to acute OT with lethargy and impulsivity as well as sternal precautions thus affecting his safety and independence with basic ADLs. He will benefit from acute OT with follow up HHOT and 24 hour S/prn A.    Follow Up Recommendations  Home health OT;Supervision/Assistance - 24 hour;Other (comment) (will need 24 hour S/prn A post D/C until wife feels safe to leave him alone)    Equipment Recommendations  3 in 1 bedside commode       Precautions / Restrictions Precautions Precautions: Fall;Sternal Precaution Comments: left chest tube Restrictions Weight Bearing Restrictions: Yes Other Position/Activity Restrictions: sternal      Mobility Bed Mobility Overal bed mobility: Needs Assistance Bed Mobility: Sit to Sidelying         Sit to sidelying: Mod assist;+2 for physical assistance    Transfers Overall transfer level: Needs assistance Equipment used: 2 person hand held assist Transfers: Sit to/from Stand Sit to Stand: Min assist;+2 physical assistance;+2 safety/equipment              Balance Overall balance assessment: Needs assistance Sitting-balance support: No upper extremity supported;Feet supported Sitting balance-Leahy Scale: Fair     Standing balance support: Bilateral upper extremity supported Standing balance-Leahy Scale: Poor                             ADL either performed or assessed with clinical  judgement   ADL Overall ADL's : Needs assistance/impaired Eating/Feeding: Independent;Sitting   Grooming: Set up;Supervision/safety;Sitting   Upper Body Bathing: Minimal assistance;Sitting   Lower Body Bathing: Maximal assistance Lower Body Bathing Details (indicate cue type and reason): min A +2 sit<>stand Upper Body Dressing : Maximal assistance;Sitting Upper Body Dressing Details (indicate cue type and reason): need to follow sternal precautions Lower Body Dressing: Total assistance Lower Body Dressing Details (indicate cue type and reason): min A +2 sit<>stand Toilet Transfer: Minimal assistance;+2 for physical assistance;+2 for safety/equipment Toilet Transfer Details (indicate cue type and reason): Bil HHA stand pivot from recliner to bed Toileting- Clothing Manipulation and Hygiene: Total assistance Toileting - Clothing Manipulation Details (indicate cue type and reason): min  A+2 sit<>stand        Wife will A with LBD/D until pt can do this himself and follow sternal precautions     Vision Baseline Vision/History: No visual deficits Patient Visual Report: No change from baseline Additional Comments: bright lights bother him (wears sunglasses even inside at times--has them here)            Pertinent Vitals/Pain Pain Assessment: Faces Faces Pain Scale: Hurts little more Pain Location: chest Pain Descriptors / Indicators: Sore Pain Intervention(s): Limited activity within patient's tolerance;Monitored during session;Other (comment) (encouraged use of heart pillow)     Hand Dominance Right   Extremity/Trunk Assessment Upper Extremity Assessment Upper Extremity Assessment: Overall WFL for tasks assessed           Communication Communication Communication: No difficulties   Cognition Arousal/Alertness: Lethargic (but arousable) Behavior  During Therapy: Impulsive (eyes closed when not moving) Overall Cognitive Status: Impaired/Different from baseline Area of  Impairment: Memory;Safety/judgement;Problem solving                     Memory: Decreased recall of precautions   Safety/Judgement: Decreased awareness of safety;Decreased awareness of deficits   Problem Solving: Requires verbal cues;Requires tactile cues General Comments: wife reports he shuts down if too much to process              Home Living Family/patient expects to be discharged to:: Private residence Living Arrangements: Spouse/significant other Available Help at Discharge: Family;Available 24 hours/day Type of Home: House Home Access: Stairs to enter [employer] of Steps: 4 (3 platform steps and 3 regular steps Entrance Stairs-Rails: Right;Left Home Layout: Two level Alternate Level Stairs-Number of Steps: 3--down to where he will be staying most of time Alternate Level Stairs-Rails: None Bathroom Shower/Tub: Occupational psychologist: Handicapped height     Home Equipment: Shower seat;Hand held shower head;Other (comment) (lift)   Additional Comments: To manage how he normally does at home, he will need to be able to stand, walk across den, up 3 steps, down hallway to bathroom      Prior Functioning/Environment Level of Independence: Independent                 OT Problem List: Impaired balance (sitting and/or standing);Decreased activity tolerance;Decreased safety awareness;Decreased knowledge of use of DME or AE;Decreased knowledge of precautions;Pain      OT Treatment/Interventions: Self-care/ADL training;Therapeutic activities;DME and/or AE instruction;Patient/family education;Balance training    OT Goals(Current goals can be found in the care plan section) Acute Rehab OT Goals Patient Stated Goal: wife to get him home soon OT Goal Formulation: With family Time For Goal Achievement: 07/20/17 Potential to Achieve Goals: Good  OT Frequency: Min 3X/week              AM-PAC PT "6 Clicks" Daily Activity     Outcome  Measure Help from another person eating meals?: None Help from another person taking care of personal grooming?: A Little Help from another person toileting, which includes using toliet, bedpan, or urinal?: A Lot Help from another person bathing (including washing, rinsing, drying)?: A Lot Help from another person to put on and taking off regular upper body clothing?: A Lot Help from another person to put on and taking off regular lower body clothing?: Total 6 Click Score: 14   End of Session Nurse Communication:  (RN in to A with back to bed)  Activity Tolerance: Patient limited by lethargy Patient left: in bed;with call bell/phone within reach;with bed alarm set;with family/visitor present  OT Visit Diagnosis: Unsteadiness on feet (R26.81);Pain Pain - part of body:  (chest--incision site)                Time: 4696-2952 OT Time Calculation (min): 20 min Charges:  OT General Charges $OT Visit: 1 Visit OT Evaluation $OT Eval Moderate Complexity: [address] [place] [phone] 07/06/2017

## 2017-07-06 NOTE — Progress Notes (Signed)
K+= 3.3 and creat= 1.16 w/ urine o/p > 30cc/hr; TCTS KCL protocol initiated with 10 mEq KCL in 50cc IV x 3, each over one hour.

## 2017-07-07 ENCOUNTER — Inpatient Hospital Stay (HOSPITAL_COMMUNITY): Payer: Medicare Other

## 2017-07-07 LAB — COOXEMETRY PANEL
Carboxyhemoglobin: 1.3 % (ref 0.5–1.5)
Carboxyhemoglobin: 1.6 % — ABNORMAL HIGH (ref 0.5–1.5)
Methemoglobin: 1.7 % — ABNORMAL HIGH (ref 0.0–1.5)
Methemoglobin: 1.2 % (ref 0.0–1.5)
O2 Saturation: 49.1 %
O2 Saturation: 54.1 %
Total hemoglobin: 8.7 g/dL — ABNORMAL LOW (ref 12.0–16.0)
Total hemoglobin: 10.6 g/dL — ABNORMAL LOW (ref 12.0–16.0)

## 2017-07-07 LAB — BASIC METABOLIC PANEL
Anion gap: 7 (ref 5–15)
BUN: 12 mg/dL (ref 6–20)
CO2: 26 mmol/L (ref 22–32)
Calcium: 8.2 mg/dL — ABNORMAL LOW (ref 8.9–10.3)
Chloride: 104 mmol/L (ref 101–111)
Creatinine, Ser: 1.06 mg/dL (ref 0.61–1.24)
GFR calc Af Amer: >60 mL/min (ref >60)
GFR calc non Af Amer: >60 mL/min (ref >60)
Glucose, Bld: 98 mg/dL (ref 65–99)
Potassium: 3.6 mmol/L (ref 3.5–5.1)
Sodium: 137 mmol/L (ref 135–145)

## 2017-07-07 LAB — POCT I-STAT, CHEM 8
BUN: 10 mg/dL (ref 6–20)
Calcium, Ion: 1.17 mmol/L (ref 1.15–1.40)
Chloride: 100 mmol/L — ABNORMAL LOW (ref 101–111)
Creatinine, Ser: 0.9 mg/dL (ref 0.61–1.24)
Glucose, Bld: 114 mg/dL — ABNORMAL HIGH (ref 65–99)
HCT: 28 % — ABNORMAL LOW (ref 39.0–52.0)
Hemoglobin: 9.5 g/dL — ABNORMAL LOW (ref 13.0–17.0)
Potassium: 3.6 mmol/L (ref 3.5–5.1)
Sodium: 140 mmol/L (ref 135–145)
TCO2: 26 mmol/L (ref 22–32)

## 2017-07-07 LAB — PROTIME-INR
INR: 1.24
Prothrombin Time: 15.5 seconds — ABNORMAL HIGH (ref 11.4–15.2)

## 2017-07-07 LAB — CBC
HCT: 27 % — ABNORMAL LOW (ref 39.0–52.0)
Hemoglobin: 8.8 g/dL — ABNORMAL LOW (ref 13.0–17.0)
MCH: 29.9 pg (ref 26.0–34.0)
MCHC: 32.6 g/dL (ref 30.0–36.0)
MCV: 91.8 fL (ref 78.0–100.0)
Platelets: 117 10*3/uL — ABNORMAL LOW (ref 150–400)
RBC: 2.94 MIL/uL — ABNORMAL LOW (ref 4.22–5.81)
RDW: 13.4 % (ref 11.5–15.5)
WBC: 9.3 10*3/uL (ref 4.0–10.5)

## 2017-07-07 MED ORDER — INFLUENZA VAC SPLIT QUAD 0.5 ML IM SUSY
0.5000 mL | PREFILLED_SYRINGE | INTRAMUSCULAR | Status: AC
  Administered 2017-07-12: 0.5 mL via INTRAMUSCULAR
  Filled 2017-07-07: qty 0.5

## 2017-07-07 MED ORDER — BENZONATATE 100 MG PO CAPS
100.0000 mg | ORAL_CAPSULE | Freq: Three times a day (TID) | ORAL | Status: DC
  Administered 2017-07-07 – 2017-07-08 (×3): 100 mg via ORAL
  Filled 2017-07-07 (×3): qty 1

## 2017-07-07 MED ORDER — FENTANYL CITRATE (PF) 100 MCG/2ML IJ SOLN
50.0000 ug | Freq: Three times a day (TID) | INTRAMUSCULAR | Status: DC | PRN
  Administered 2017-07-07: 50 ug via INTRAVENOUS
  Filled 2017-07-07: qty 2

## 2017-07-07 MED ORDER — FUROSEMIDE 40 MG PO TABS
40.0000 mg | ORAL_TABLET | Freq: Every day | ORAL | Status: DC
  Administered 2017-07-07 – 2017-07-12 (×6): 40 mg via ORAL
  Filled 2017-07-07 (×6): qty 1

## 2017-07-07 MED ORDER — WARFARIN SODIUM 7.5 MG PO TABS
7.5000 mg | ORAL_TABLET | Freq: Every day | ORAL | Status: DC
  Administered 2017-07-07 – 2017-07-09 (×3): 7.5 mg via ORAL
  Filled 2017-07-07 (×3): qty 1

## 2017-07-07 MED ORDER — POTASSIUM CHLORIDE 10 MEQ/50ML IV SOLN
10.0000 meq | INTRAVENOUS | Status: AC
  Administered 2017-07-07 (×3): 10 meq via INTRAVENOUS
  Filled 2017-07-07 (×3): qty 50

## 2017-07-07 MED ORDER — PNEUMOCOCCAL VAC POLYVALENT 25 MCG/0.5ML IJ INJ
0.5000 mL | INJECTION | INTRAMUSCULAR | Status: AC
  Administered 2017-07-12: 0.5 mL via INTRAMUSCULAR
  Filled 2017-07-07: qty 0.5

## 2017-07-07 MED ORDER — GUAIFENESIN ER 600 MG PO TB12
600.0000 mg | ORAL_TABLET | Freq: Two times a day (BID) | ORAL | Status: DC
  Administered 2017-07-07 – 2017-07-08 (×2): 600 mg via ORAL
  Filled 2017-07-07 (×2): qty 1

## 2017-07-07 MED ORDER — LABETALOL HCL 5 MG/ML IV SOLN
10.0000 mg | INTRAVENOUS | Status: DC | PRN
  Administered 2017-07-07 – 2017-07-08 (×3): 10 mg via INTRAVENOUS
  Filled 2017-07-07 (×2): qty 4

## 2017-07-07 MED ORDER — MILRINONE LACTATE IN DEXTROSE 20-5 MG/100ML-% IV SOLN
0.1250 ug/kg/min | INTRAVENOUS | Status: DC
  Administered 2017-07-07 (×2): 0.125 ug/kg/min via INTRAVENOUS
  Filled 2017-07-07 (×2): qty 100

## 2017-07-07 MED ORDER — ALPRAZOLAM 0.5 MG PO TABS
1.0000 mg | ORAL_TABLET | Freq: Three times a day (TID) | ORAL | Status: DC | PRN
  Administered 2017-07-07: 1 mg via ORAL
  Filled 2017-07-07: qty 2

## 2017-07-07 MED ORDER — LORAZEPAM 2 MG/ML IJ SOLN
0.5000 mg | Freq: Two times a day (BID) | INTRAMUSCULAR | Status: DC | PRN
  Administered 2017-07-07: 0.5 mg via INTRAVENOUS
  Filled 2017-07-07: qty 1

## 2017-07-07 NOTE — Progress Notes (Signed)
Pt refusing to keep continuous pulse oximetry monitoring on; alongside various other equipment, despite presence of sitter. Pt previously satting 94-98% on room air. Will spot-check SpO2 levels and monitor the patient closely.

## 2017-07-07 NOTE — Progress Notes (Signed)
5 Days Post-Op Procedure(s) (LRB): BENTALL PROCEDURE (N/A) TRANSESOPHAGEAL ECHOCARDIOGRAM (TEE) (N/A) Subjective: Bentall for AI, 5.5 cm ascending aneurysm Mental status better NSR Walked in hall  No airleak from CXR- sub Q air on CXR increased c0-0x low on 3.125 coreg- will DC , cont low dose mil while in ICU Objective: Vital signs in last 24 hours: Temp:  [98 F (36.7 C)-99 F (37.2 C)] 98 F (36.7 C) (09/18 1529) Pulse Rate:  [75-89] 86 (09/18 0100) Cardiac Rhythm: Normal sinus rhythm;Heart block (09/18 0800) Resp:  [17-35] 22 (09/18 1500) BP: (110-159)/(68-91) 151/88 (09/18 1500) SpO2:  [90 %-98 %] 96 % (09/18 0330) Weight:  [281 lb 15.5 oz (127.9 kg)] 281 lb 15.5 oz (127.9 kg) (09/18 0330)  Hemodynamic parameters for last 24 hours:   Intake/Output from previous day: 09/17 0701 - 09/18 0700 In: 1101.8 [P.O.:360; I.V.:341.8; IV Piggyback:400] Out: 5093 [Urine:3410; Chest Tube:290] Intake/Output this shift: Total I/O In: 533.4 [P.O.:480; I.V.:53.4] Out: 1300 [Urine:1250; Chest Tube:50]       Exam    General- alert and comfortable   Lungs- clear without rales, wheezes   Cor- regular rate and rhythm, no murmur , gallop   Abdomen- soft, non-tender   Extremities - warm, non-tender, minimal edema   Neuro- oriented, appropriate, no focal weakness   Lab Results:  Recent Labs  07/06/17 0338 07/06/17 1551 07/07/17 0300  WBC 9.6  --  9.3  HGB 8.8* 8.2* 8.8*  HCT 26.5* 24.0* 27.0*  PLT 103*  --  117*   BMET:  Recent Labs  07/06/17 0338 07/06/17 1551 07/07/17 0300  NA 137 139 137  K 3.3* 3.7 3.6  CL 104 100* 104  CO2 27  --  26  GLUCOSE 93 108* 98  BUN 19 16 12   CREATININE 1.16 0.80 1.06  CALCIUM 8.1*  --  8.2*    PT/INR:  Recent Labs  07/07/17 0300  LABPROT 15.5*  INR 1.24   ABG    Component Value Date/Time   PHART 7.458 (H) 07/03/2017 1846   HCO3 27.0 07/03/2017 1846   TCO2 28 07/06/2017 1551   ACIDBASEDEF 6.0 (H) 07/03/2017 0627   O2SAT  49.1 07/07/2017 0305   CBG (last 3)   Recent Labs  07/04/17 1921 07/04/17 2310 07/05/17 0354  GLUCAP 114* 115* 91    Assessment/Plan: S/P Procedure(s) (LRB): BENTALL PROCEDURE (N/A) TRANSESOPHAGEAL ECHOCARDIOGRAM (TEE) (N/A) Mobilize Diuresis coumadin for postop Afib   LOS: 5 days    Grant [name] 07/07/2017

## 2017-07-07 NOTE — Progress Notes (Signed)
Physical Therapy Treatment Patient Details Name: Grant [name] MRN: [id number] DOB: 1951/02/04 Today's Date: 07/07/2017    History of Present Illness 66 year old hypertensive male with chronic fatigue syndrome and sleep apnea with a 5.4 cm aortic root aneurysm with moderate aortic insuficiency and bicuspid valve with needs a aortic root replacement plan for a biologic-Bentall procedure, which was done 9/13; recovery has been complicated by some delirium    PT Comments    Pt admitted with above diagnosis. Pt currently with functional limitations due to balance and endurance deficits. Pt was able to ambulate on unit with min to mod assist with RW with left hip giving out at times.  Progressing thus far.  Impulsivity limits pt at times. Will continue acute PT.  Pt will benefit from skilled PT to increase their independence and safety with mobility to allow discharge to the venue listed below.     Follow Up Recommendations  Home health PT;Supervision/Assistance - 24 hour     Equipment Recommendations  Rolling walker with 5" wheels;3in1 (PT)    Recommendations for Other Services OT consult     Precautions / Restrictions Precautions Precautions: Fall;Sternal Precaution Comments: left chest tube Restrictions Weight Bearing Restrictions:  (Sternal Precautions) Other Position/Activity Restrictions: sternal    Mobility  Bed Mobility Overal bed mobility: Needs Assistance Bed Mobility: Rolling;Sidelying to Sit;Sit to Sidelying Rolling: Min guard Sidelying to sit: Min guard     Sit to sidelying: +2 for physical assistance;Min assist General bed mobility comments: cues for technque to hold pillow and for sternal precautions, and cues only to come to EOB. Pt needed  min to help LEs back into bed.    Transfers Overall transfer level: Needs assistance Equipment used: Rolling walker (2 wheeled) Transfers: Sit to/from Stand Sit to Stand: Min assist;+2 physical assistance          General transfer comment: Min assist to steady; good power up, albeit impulsive; sat impulsively as well  Ambulation/Gait Ambulation/Gait assistance: Min assist;+2 safety/equipment;Mod assist Ambulation Distance (Feet): 295 Feet (150 then 100 then 45 with seated rest breaks in between.) Assistive device: Rolling walker (2 wheeled) Gait Pattern/deviations: Step-through pattern;Decreased stride length;Staggering left;Staggering right;Drifts right/left;Trunk flexed;Wide base of support;Antalgic   Gait velocity interpretation: <1.8 ft/sec, indicative of risk for recurrent falls General Gait Details: Cues for safety; Pt needed constant cues to steer RW correctly as he tends to veer to left.  Pt impulsive with cues to slow down and for postural stability.  Pt was able to ambulate with seated rest breaks.  Pt showed signs of fatigue with limited distance each attempt.   Signs of fatigue were poor postural stability with cues needed to stand tall and left hip and knee instability with need for support when this would happen.  Pt appears unaware and does not stop and needs cuing for safety.    Stairs            Wheelchair Mobility    Modified Rankin (Stroke Patients Only)       Balance Overall balance assessment: Needs assistance Sitting-balance support: No upper extremity supported;Feet supported Sitting balance-Leahy Scale: Fair     Standing balance support: Bilateral upper extremity supported Standing balance-Leahy Scale: Poor Standing balance comment: relies on UEs upport for balance in standing.                             Cognition Arousal/Alertness: Awake/alert Behavior During Therapy: Impulsive Overall Cognitive Status: Impaired/Different from  baseline Area of Impairment: Memory;Safety/judgement;Problem solving                   Current Attention Level: Sustained Memory: Decreased recall of precautions   Safety/Judgement: Decreased awareness of  safety;Decreased awareness of deficits   Problem Solving: Requires verbal cues;Requires tactile cues General Comments: wife reports he shuts down if too much to process      Exercises General Exercises - Lower Extremity Ankle Circles/Pumps: AROM;Both;10 reps;Supine Quad Sets: AROM;Both;10 reps;Supine Long Arc Quad: AROM;Both;10 reps;Supine    General Comments General comments (skin integrity, edema, etc.): Pt ambulated on RA with sats 92%.  DOE 2/4 and remained 2/4 after walk therefore nursing asked PT to replace O2 at 2L with sats 92-96% on departure.  All VSS with ambualation.       Pertinent Vitals/Pain Pain Assessment: Faces Faces Pain Scale: Hurts even more Pain Location: chest Pain Descriptors / Indicators: Sore Pain Intervention(s): Limited activity within patient's tolerance;Monitored during session;Repositioned    Home Living                      Prior Function            PT Goals (current goals can now be found in the care plan section) Progress towards PT goals: Progressing toward goals    Frequency    Min 3X/week      PT Plan Current plan remains appropriate    Co-evaluation              AM-PAC PT "6 Clicks" Daily Activity  Outcome Measure  Difficulty turning over in bed (including adjusting bedclothes, sheets and blankets)?: A Little Difficulty moving from lying on back to sitting on the side of the bed? : A Little Difficulty sitting down on and standing up from a chair with arms (e.g., wheelchair, bedside commode, etc,.)?: A Little Help needed moving to and from a bed to chair (including a wheelchair)?: A Little Help needed walking in hospital room?: A Lot Help needed climbing 3-5 steps with a railing? : A Lot 6 Click Score: 16    End of Session Equipment Utilized During Treatment: Oxygen;Gait belt Activity Tolerance: Patient limited by fatigue;Patient limited by pain Patient left: with call bell/phone within reach;with  family/visitor present;in bed Nurse Communication: Mobility status PT Visit Diagnosis: Unsteadiness on feet (R26.81);Other abnormalities of gait and mobility (R26.89)     Time: 1610-9604 PT Time Calculation (min) (ACUTE ONLY): 30 min  Charges:  $Gait Training: 23-37 mins                    G Codes:       Lakeva Hollon,PT Acute Rehabilitation [phone] (pager)    Denice Paradise 07/07/2017, 11:23 AM

## 2017-07-07 NOTE — Progress Notes (Signed)
TCTS BRIEF SICU PROGRESS NOTE  5 Days Post-Op  S/P Procedure(s) (LRB): BENTALL PROCEDURE (N/A) TRANSESOPHAGEAL ECHOCARDIOGRAM (TEE) (N/A)   Stable day NSR w/ stable BP, somewhat elevated this afternoon Breathing comfortably w/ O2 sats 96% on RA Excellent UOP Potassium 3.6, being replaced  Plan: Continue current plan  Rexene Alberts, MD 07/07/2017 5:49 PM

## 2017-07-08 ENCOUNTER — Inpatient Hospital Stay (HOSPITAL_COMMUNITY): Payer: Medicare Other

## 2017-07-08 LAB — BASIC METABOLIC PANEL
Anion gap: 8 (ref 5–15)
BUN: 9 mg/dL (ref 6–20)
CO2: 27 mmol/L (ref 22–32)
Calcium: 8.3 mg/dL — ABNORMAL LOW (ref 8.9–10.3)
Chloride: 102 mmol/L (ref 101–111)
Creatinine, Ser: 0.96 mg/dL (ref 0.61–1.24)
GFR calc Af Amer: >60 mL/min (ref >60)
GFR calc non Af Amer: >60 mL/min (ref >60)
Glucose, Bld: 127 mg/dL — ABNORMAL HIGH (ref 65–99)
Potassium: 3.8 mmol/L (ref 3.5–5.1)
Sodium: 137 mmol/L (ref 135–145)

## 2017-07-08 LAB — COOXEMETRY PANEL
Carboxyhemoglobin: 1.2 % (ref 0.5–1.5)
Methemoglobin: 1.4 % (ref 0.0–1.5)
O2 Saturation: 57.8 %
Total hemoglobin: 9.2 g/dL — ABNORMAL LOW (ref 12.0–16.0)

## 2017-07-08 LAB — CBC
HCT: 28.1 % — ABNORMAL LOW (ref 39.0–52.0)
Hemoglobin: 9.1 g/dL — ABNORMAL LOW (ref 13.0–17.0)
MCH: 29.6 pg (ref 26.0–34.0)
MCHC: 32.4 g/dL (ref 30.0–36.0)
MCV: 91.5 fL (ref 78.0–100.0)
Platelets: 134 10*3/uL — ABNORMAL LOW (ref 150–400)
RBC: 3.07 MIL/uL — ABNORMAL LOW (ref 4.22–5.81)
RDW: 13.4 % (ref 11.5–15.5)
WBC: 11.3 10*3/uL — ABNORMAL HIGH (ref 4.0–10.5)

## 2017-07-08 LAB — PROTIME-INR
INR: 1.33
Prothrombin Time: 16.3 seconds — ABNORMAL HIGH (ref 11.4–15.2)

## 2017-07-08 MED ORDER — AMIODARONE IV BOLUS ONLY 150 MG/100ML
150.0000 mg | Freq: Once | INTRAVENOUS | Status: AC
  Administered 2017-07-08: 150 mg via INTRAVENOUS
  Filled 2017-07-08: qty 100

## 2017-07-08 MED ORDER — BENZONATATE 100 MG PO CAPS
200.0000 mg | ORAL_CAPSULE | Freq: Three times a day (TID) | ORAL | Status: DC
  Administered 2017-07-08 – 2017-07-12 (×11): 200 mg via ORAL
  Filled 2017-07-08 (×11): qty 2

## 2017-07-08 MED ORDER — METOPROLOL TARTRATE 5 MG/5ML IV SOLN
5.0000 mg | Freq: Four times a day (QID) | INTRAVENOUS | Status: DC | PRN
  Filled 2017-07-08: qty 5

## 2017-07-08 MED ORDER — AMIODARONE HCL 200 MG PO TABS
400.0000 mg | ORAL_TABLET | Freq: Two times a day (BID) | ORAL | Status: DC
Start: 2017-07-08 — End: 2017-07-12
  Administered 2017-07-08 – 2017-07-12 (×8): 400 mg via ORAL
  Filled 2017-07-08 (×8): qty 2

## 2017-07-08 MED ORDER — AMIODARONE HCL IN DEXTROSE 360-4.14 MG/200ML-% IV SOLN
INTRAVENOUS | Status: AC
  Administered 2017-07-08: 17:00:00
  Filled 2017-07-08: qty 200

## 2017-07-08 MED ORDER — LEVALBUTEROL HCL 1.25 MG/0.5ML IN NEBU: 1.2500 mg | INHALATION_SOLUTION | Freq: Four times a day (QID) | RESPIRATORY_TRACT | Status: DC | PRN

## 2017-07-08 MED ORDER — AMIODARONE LOAD VIA INFUSION: 150.0000 mg | Freq: Once | INTRAVENOUS | Status: DC

## 2017-07-08 MED ORDER — HYDROCOD POLST-CPM POLST ER 10-8 MG/5ML PO SUER
5.0000 mL | Freq: Two times a day (BID) | ORAL | Status: AC
Start: 2017-07-08 — End: 2017-07-10
  Administered 2017-07-08 – 2017-07-10 (×6): 5 mL via ORAL
  Filled 2017-07-08 (×7): qty 5

## 2017-07-08 MED ORDER — CARVEDILOL 6.25 MG PO TABS
6.2500 mg | ORAL_TABLET | Freq: Two times a day (BID) | ORAL | Status: DC
  Administered 2017-07-08: 6.25 mg via ORAL
  Filled 2017-07-08 (×2): qty 1

## 2017-07-08 MED ORDER — DILTIAZEM HCL 25 MG/5ML IV SOLN
5.0000 mg | Freq: Once | INTRAVENOUS | Status: AC
  Administered 2017-07-08: 5 mg via INTRAVENOUS
  Filled 2017-07-08: qty 5

## 2017-07-08 MED ORDER — SORBITOL 70 % PO SOLN
30.0000 mL | Freq: Every day | ORAL | Status: DC | PRN
  Filled 2017-07-08: qty 30

## 2017-07-08 MED ORDER — DEXTROMETHORPHAN POLISTIREX ER 30 MG/5ML PO SUER
30.0000 mg | Freq: Every evening | ORAL | Status: DC | PRN
  Administered 2017-07-09 (×2): 30 mg via ORAL
  Filled 2017-07-08 (×3): qty 5

## 2017-07-08 MED ORDER — AMIODARONE IV BOLUS ONLY 150 MG/100ML
150.0000 mg | Freq: Once | INTRAVENOUS | Status: AC
  Filled 2017-07-08: qty 100

## 2017-07-08 MED ORDER — CARVEDILOL 6.25 MG PO TABS: 6.2500 mg | ORAL_TABLET | Freq: Two times a day (BID) | ORAL | Status: DC

## 2017-07-08 NOTE — Progress Notes (Signed)
6 Days Post-Op Procedure(s) (LRB): BENTALL PROCEDURE (N/A) TRANSESOPHAGEAL ECHOCARDIOGRAM (TEE) (N/A) Subjective: More agitated , poor sleep last night Constant stimuli in ICU making agitation worse No air leak and subQ air much better Needs quieter environment- he is hemodynamically stable and will benefit from private room  Objective: Vital signs in last 24 hours: Temp:  [98 F (36.7 C)-99.1 F (37.3 C)] 98.1 F (36.7 C) (09/19 0700) Pulse Rate:  [83-91] 83 (09/18 2353) Cardiac Rhythm: Normal sinus rhythm;Heart block (09/19 0400) Resp:  [18-41] 27 (09/19 0700) BP: (94-160)/(67-97) 94/67 (09/19 0700) SpO2:  [92 %-96 %] 93 % (09/19 0400) Weight:  [274 lb 14.6 oz (124.7 kg)] 274 lb 14.6 oz (124.7 kg) (09/19 0352)  Hemodynamic parameters for last 24 hours:  stable  Intake/Output from previous day: 09/18 0701 - 09/19 0700 In: 950.6 [P.O.:720; I.V.:130.6; IV Piggyback:100] Out: 2591 [Urine:2500; Stool:1; Chest Tube:90] Intake/Output this shift: No intake/output data recorded.       Exam    General- alert and comfortable   Lungs- clear without rales, wheezes   Cor- regular rate and rhythm, no murmur , gallop   Abdomen- soft, non-tender   Extremities - warm, non-tender, minimal edema   Neuro- oriented, appropriate, no focal weakness   Lab Results:  Recent Labs  07/07/17 0300 07/07/17 1627 07/08/17 0315  WBC 9.3  --  11.3*  HGB 8.8* 9.5* 9.1*  HCT 27.0* 28.0* 28.1*  PLT 117*  --  134*   BMET:  Recent Labs  07/07/17 0300 07/07/17 1627 07/08/17 0315  NA 137 140 137  K 3.6 3.6 3.8  CL 104 100* 102  CO2 26  --  27  GLUCOSE 98 114* 127*  BUN 12 10 9   CREATININE 1.06 0.90 0.96  CALCIUM 8.2*  --  8.3*    PT/INR:  Recent Labs  07/08/17 0315  LABPROT 16.3*  INR 1.33   ABG    Component Value Date/Time   PHART 7.458 (H) 07/03/2017 1846   HCO3 27.0 07/03/2017 1846   TCO2 26 07/07/2017 1627   ACIDBASEDEF 6.0 (H) 07/03/2017 0627   O2SAT 57.8 07/08/2017  0330   CBG (last 3)  No results for input(s): GLUCAP in the last 72 hours.  Assessment/Plan: S/P Procedure(s) (LRB): BENTALL PROCEDURE (N/A) TRANSESOPHAGEAL ECHOCARDIOGRAM (TEE) (N/A) Mobilize Diuresis transfer to tele Discharge planning  LOS: 6 days    Tharon Aquas Trigt III 07/08/2017

## 2017-07-08 NOTE — Progress Notes (Signed)
Pt transported to 4E08 via wheelchair accompanied by RN, Sitter, and pt's wife. Transferred to bed independently after setup. Tolerated well. No s/s of distress at this time. Jen RN at bedside to accept pt.

## 2017-07-08 NOTE — Progress Notes (Signed)
Patient has home cpap unit set up at beside. Patient places himself on/off cpap. RT will monitor as needed.

## 2017-07-08 NOTE — Progress Notes (Signed)
OT Cancellation Note  Patient Details Name: Grant [name] MRN: [id number] DOB: [birth year]   Cancelled Treatment:    Reason Eval/Treat Not Completed: Medical issues which prohibited therapy. Discussed with RN who requests hold at this time due to medical issues this afternoon. Will check back as able.   Norman Herrlich, MS OTR/L  Pager: [phone]  Norman Herrlich 07/08/2017, 4:17 PM

## 2017-07-08 NOTE — Progress Notes (Signed)
Pt being impulsive standing up, pulling monitor leads out and pulling on IVs, stating he "wants to go to the front desk to sign himself out. Pt reoriented and educated on the risks of any tubes that may get dislodged without assistance. Dr. Prescott Gum paged. Per MD, he will see pt as soon as he can. [name] called and informed of the situation. [name] spoke with pt.

## 2017-07-08 NOTE — Progress Notes (Signed)
Mediastinal Chest tube removed without complications. Pt tolerated well. No s/s of distress at this time.

## 2017-07-08 NOTE — Progress Notes (Addendum)
Pt HR maintaining 120-135 in atrial fibrillation. Spoke with Dr. Prescott Gum - verbal order for 12 lead EKG and to call Nicholes Rough PA. 12 lead EKG shows "atrial fibrillation with rapid ventricular response, incomplete left bundle branch block'. HR 124 bpm." Spoke with Nicholes Rough. Given verbal order for IV amiodarone bolus + infusion. Asked to page Ellwood Handler PA who is on call. Johann Capers said she would update Dr. Prescott Gum. Primary RN Delsa Sale updated.  Spoke with Ellwood Handler. Verbal order for IV amio bolus ONLY. She will speak with Dr. Prescott Gum and call back. Primary RN Delsa Sale updated. Patient and family updated. Will continue to monitor.   Fritz Pickerel, RN

## 2017-07-08 NOTE — Progress Notes (Signed)
Patient arrived to 4E room 13.  Telemetry monitor applied and CCMD notified.  Patient oriented to unit and room to include call light and phone.  Will continue to monitor.

## 2017-07-08 NOTE — Progress Notes (Signed)
Contacted via [facility], that patient is back in Atrial fibrillation with RVR.  She obtained an EKG and requested someone to come evaluate the patient.    BP (!) 141/85 (BP Location: Right Arm)   Pulse (!) 126   Temp 98.2 F (36.8 C) (Oral)   Resp (!) 26   Ht 5\' 11"  (1.803 m)   Wt 274 lb 14.6 oz (124.7 kg)   SpO2 95%   BMI 38.34 kg/m    Patient is stable.  His biggest complaint is cough, stating that it has been continuous and he isn't getting the right medicine to relieve this.  He is requesting home Delsym and an increase in his current dose of Tessalon.  He also states that he does not want to deal with people bothering him overnight and telling him what he needs to be doing.  He states that he knows how to manage his situation and he will leave if necessary.  He also states that he may just have a pint of liquor brought to the hospital which usually relieves his cough.  Gen: no apparent distress Heart: IRRR Lungs: CTA  A/P:  1. Atrial Fibrillation with RVR, patient has chronic history- will rebolus with Amiodarone and continue oral regimen... Will also start Coreg at 6.25 mg BID as he was not on a BB... Will also place prn Metoprolol orders for HR control 2. Pulm- using CPAP prn, + cough, productive a times, will increase Tessalon to 200 mg and order Delsym which patient uses at home with success 3. Anxiety- patient has issues with this, he is not resting... I adjusted orders to try ensure patient will not be bothered overnight 4. Dispo- patient stable, happy with plan, continue current care... I have spoken with Dr. Prescott Gum and have followed his recommendations  Eros Montour, PA-C

## 2017-07-09 LAB — PROTIME-INR
INR: 1.61
Prothrombin Time: 19 seconds — ABNORMAL HIGH (ref 11.4–15.2)

## 2017-07-09 MED ORDER — SALINE SPRAY 0.65 % NA SOLN
1.0000 | NASAL | Status: DC | PRN
  Administered 2017-07-09: 1 via NASAL
  Filled 2017-07-09: qty 44

## 2017-07-09 MED ORDER — PATIENT'S GUIDE TO USING COUMADIN BOOK
Freq: Once | Status: AC
  Administered 2017-07-09: 17:00:00
  Filled 2017-07-09: qty 1

## 2017-07-09 MED ORDER — CARVEDILOL 12.5 MG PO TABS
12.5000 mg | ORAL_TABLET | Freq: Two times a day (BID) | ORAL | Status: DC
  Administered 2017-07-09 – 2017-07-12 (×6): 12.5 mg via ORAL
  Filled 2017-07-09 (×6): qty 1

## 2017-07-09 MED ORDER — PHENOL 1.4 % MT LIQD
1.0000 | OROMUCOSAL | Status: DC | PRN
  Administered 2017-07-09: 1 via OROMUCOSAL
  Filled 2017-07-09: qty 177

## 2017-07-09 MED ORDER — WARFARIN VIDEO: Freq: Once | Status: DC

## 2017-07-09 MED ORDER — MENTHOL 3 MG MT LOZG
1.0000 | LOZENGE | OROMUCOSAL | Status: DC | PRN
  Administered 2017-07-10: 3 mg via ORAL
  Filled 2017-07-09 (×2): qty 9

## 2017-07-09 MED ORDER — VITAMIN B-1 100 MG PO TABS
100.0000 mg | ORAL_TABLET | Freq: Every day | ORAL | Status: DC
  Administered 2017-07-10 – 2017-07-12 (×3): 100 mg via ORAL
  Filled 2017-07-09 (×3): qty 1

## 2017-07-09 NOTE — Progress Notes (Addendum)
      [address]       [place] [address]             [phone]      7 Days Post-Op Procedure(s) (LRB): BENTALL PROCEDURE (N/A) TRANSESOPHAGEAL ECHOCARDIOGRAM (TEE) (N/A) Subjective: Shares that he has a sore throat and a stuffy nose this morning. He still complains of cough.   Objective: Vital signs in last 24 hours: Temp:  [97.9 F (36.6 C)-99.4 F (37.4 C)] 97.9 F (36.6 C) (09/20 0629) Pulse Rate:  [100-126] 100 (09/20 0629) Cardiac Rhythm: Atrial fibrillation (09/20 0700) Resp:  [16-29] 16 (09/20 0629) BP: (110-144)/(79-98) 110/79 (09/20 0629) SpO2:  [93 %-98 %] 98 % (09/19 2201)     Intake/Output from previous day: 09/19 0701 - 09/20 0700 In: 614.4 [P.O.:400; I.V.:214.4] Out: 450 [Urine:450] Intake/Output this shift: No intake/output data recorded.  General appearance: alert, cooperative and no distress Heart: sinus tachycardia, rate 120s Lungs: clear to auscultation bilaterally Abdomen: soft, non-tender; bowel sounds normal; no masses,  no organomegaly Extremities: extremities normal, atraumatic, no cyanosis or edema Wound: some clear drainage from the top of the incision. otherwise c/d/i without erythema  Lab Results:  Recent Labs  07/07/17 0300 07/07/17 1627 07/08/17 0315  WBC 9.3  --  11.3*  HGB 8.8* 9.5* 9.1*  HCT 27.0* 28.0* 28.1*  PLT 117*  --  134*   BMET:  Recent Labs  07/07/17 0300 07/07/17 1627 07/08/17 0315  NA 137 140 137  K 3.6 3.6 3.8  CL 104 100* 102  CO2 26  --  27  GLUCOSE 98 114* 127*  BUN 12 10 9   CREATININE 1.06 0.90 0.96  CALCIUM 8.2*  --  8.3*    PT/INR:  Recent Labs  07/09/17 0355  LABPROT 19.0*  INR 1.61   ABG    Component Value Date/Time   PHART 7.458 (H) 07/03/2017 1846   HCO3 27.0 07/03/2017 1846   TCO2 26 07/07/2017 1627   ACIDBASEDEF 6.0 (H) 07/03/2017 0627   O2SAT 57.8 07/08/2017 0330   CBG (last 3)  No results for input(s): GLUCAP in the last 72  hours.  Assessment/Plan: S/P Procedure(s) (LRB): BENTALL PROCEDURE (N/A) TRANSESOPHAGEAL ECHOCARDIOGRAM (TEE) (N/A)  1. Chronic A. Fib. Now Sinus tachy rate 120s. Coreg 6.25 initated, BP stable. PRN metoprolol.  2. Pulm-CXR yesterday showed small right apical pneumothoax and stable diffuse subcutaneous emphysema. Has cough therefore on Delsym and Tessalon capsules.  3. Renal-creatinine 0.96, electrolytes okay 4. H and H stable 5. Platelets trending up 6. Anxiety and insomnia-taking Klonopin at night   Plan: work on rhythm control today. Ambulate several times a day. Keep EPW for now. Work on Chiropodist. Tolerating room air for now. CPAP at night.    LOS: 7 days    Elgie Collard 07/09/2017        patient examined and medical record reviewed,agree with above note. Tharon Aquas Trigt III 07/09/2017   Aim for DC 1-2 days with [facility] HHPT

## 2017-07-09 NOTE — Care Management Important Message (Signed)
Important Message  Patient Details  Name: Grant [name] MRN: [id number] Date of Birth: [birth year]   Medicare Important Message Given:  Yes    [name] Abena 07/09/2017, 10:14 AM

## 2017-07-09 NOTE — Progress Notes (Addendum)
Assumed care from off going RN: patient in and out AFIB, patient asymptomatic; prn pain medication request; family @ bedside

## 2017-07-09 NOTE — Progress Notes (Signed)
PT Cancellation Note  Patient Details Name: [name] MRN: [id number] DOB: 1951/02/25   Cancelled Treatment:    Reason Eval/Treat Not Completed: Patient declined, no reason specified Family member met therapist at door and requested that pt be able to sleep. PT will check on pt later as time allows.    Salina April, PTA Pager: [phone]   07/09/2017, 9:33 AM

## 2017-07-09 NOTE — Progress Notes (Signed)
Physical Therapy Treatment Patient Details Name: Grant [name] MRN: [id number] DOB: September 29, 1951 Today's Date: 07/09/2017    History of Present Illness 66 year old hypertensive male with chronic fatigue syndrome and sleep apnea with a 5.4 cm aortic root aneurysm with moderate aortic insuficiency and bicuspid valve with needs a aortic root replacement plan for a biologic-Bentall procedure, which was done 9/13; recovery has been complicated by some delirium    PT Comments    Patient is progressing well toward mobility goals and tolerated increased gait distance with min guard/min A for safety. Pt ambulated without O2 or RW and required no rest breaks this session. Pt does continues to demonstrated decreased recall of precautions and is impulsive at times. VSS throughout. Patient needs to practice stairs next session.     Follow Up Recommendations  Home health PT;Supervision/Assistance - 24 hour     Equipment Recommendations  Rolling walker with 5" wheels;3in1 (PT)    Recommendations for Other Services OT consult     Precautions / Restrictions Precautions Precautions: Fall;Sternal Restrictions Weight Bearing Restrictions: Yes Other Position/Activity Restrictions: sternal    Mobility  Bed Mobility               General bed mobility comments: pt sitting EOB upon arrival  Transfers Overall transfer level: Needs assistance Equipment used: Rolling walker (2 wheeled) Transfers: Sit to/from Stand Sit to Stand: Min guard         General transfer comment: min guard for safety; cues for technique to maintain precautions  Ambulation/Gait Ambulation/Gait assistance: Min assist;Min guard Ambulation Distance (Feet): 380 Feet Assistive device: None Gait Pattern/deviations: Step-through pattern;Decreased stride length;Drifts right/left;Wide base of support Gait velocity: decreased   General Gait Details: cues for safety; pt unsteady and drifting R/L which pt and wife report is  his baseline; pt is easily distracted; no LOB   Stairs            Wheelchair Mobility    Modified Rankin (Stroke Patients Only)       Balance Overall balance assessment: Needs assistance Sitting-balance support: No upper extremity supported;Feet supported Sitting balance-Leahy Scale: Good       Standing balance-Leahy Scale: Fair                              Cognition Arousal/Alertness: Awake/alert Behavior During Therapy: Impulsive;Flat affect Overall Cognitive Status: Impaired/Different from baseline Area of Impairment: Memory;Safety/judgement;Problem solving                     Memory: Decreased recall of precautions   Safety/Judgement: Decreased awareness of safety;Decreased awareness of deficits   Problem Solving: Requires verbal cues;Requires tactile cues General Comments: pt required frequent vc to adher to sternal precautions      Exercises      General Comments General comments (skin integrity, edema, etc.): VSS      Pertinent Vitals/Pain Pain Assessment: Faces Faces Pain Scale: Hurts little more Pain Location: chest Pain Descriptors / Indicators: Sore Pain Intervention(s): Limited activity within patient's tolerance;Monitored during session;Repositioned;Premedicated before session    Home Living                      Prior Function            PT Goals (current goals can now be found in the care plan section) Acute Rehab PT Goals PT Goal Formulation: With family Time For Goal Achievement: 07/19/17 Potential to Achieve Goals:  Good Progress towards PT goals: Progressing toward goals    Frequency    Min 3X/week      PT Plan Current plan remains appropriate    Co-evaluation              AM-PAC PT "6 Clicks" Daily Activity  Outcome Measure  Difficulty turning over in bed (including adjusting bedclothes, sheets and blankets)?: A Little Difficulty moving from lying on back to sitting on the side of  the bed? : A Lot Difficulty sitting down on and standing up from a chair with arms (e.g., wheelchair, bedside commode, etc,.)?: A Lot Help needed moving to and from a bed to chair (including a wheelchair)?: A Little Help needed walking in hospital room?: A Little Help needed climbing 3-5 steps with a railing? : A Lot 6 Click Score: 15    End of Session Equipment Utilized During Treatment: Gait belt Activity Tolerance: Patient tolerated treatment well Patient left: with call bell/phone within reach;with family/visitor present;in bed Nurse Communication: Mobility status PT Visit Diagnosis: Unsteadiness on feet (R26.81);Other abnormalities of gait and mobility (R26.89)     Time: 1335-1400 PT Time Calculation (min) (ACUTE ONLY): 25 min  Charges:  $Gait Training: 8-22 mins $Therapeutic Activity: 8-22 mins                    G Codes:       Earney Navy, PTA Pager: [phone]     Darliss Cheney 07/09/2017, 3:41 PM

## 2017-07-10 ENCOUNTER — Inpatient Hospital Stay (HOSPITAL_COMMUNITY): Payer: Medicare Other

## 2017-07-10 LAB — BASIC METABOLIC PANEL
Anion gap: 7 (ref 5–15)
BUN: 11 mg/dL (ref 6–20)
CO2: 25 mmol/L (ref 22–32)
Calcium: 8.3 mg/dL — ABNORMAL LOW (ref 8.9–10.3)
Chloride: 102 mmol/L (ref 101–111)
Creatinine, Ser: 1 mg/dL (ref 0.61–1.24)
GFR calc Af Amer: >60 mL/min (ref >60)
GFR calc non Af Amer: >60 mL/min (ref >60)
Glucose, Bld: 103 mg/dL — ABNORMAL HIGH (ref 65–99)
Potassium: 4 mmol/L (ref 3.5–5.1)
Sodium: 134 mmol/L — ABNORMAL LOW (ref 135–145)

## 2017-07-10 LAB — CBC
HCT: 29.7 % — ABNORMAL LOW (ref 39.0–52.0)
Hemoglobin: 9.6 g/dL — ABNORMAL LOW (ref 13.0–17.0)
MCH: 30.4 pg (ref 26.0–34.0)
MCHC: 32.3 g/dL (ref 30.0–36.0)
MCV: 94 fL (ref 78.0–100.0)
Platelets: 179 10*3/uL (ref 150–400)
RBC: 3.16 MIL/uL — ABNORMAL LOW (ref 4.22–5.81)
RDW: 14 % (ref 11.5–15.5)
WBC: 11.7 10*3/uL — ABNORMAL HIGH (ref 4.0–10.5)

## 2017-07-10 LAB — PROTIME-INR
INR: 1.89
Prothrombin Time: 21.5 seconds — ABNORMAL HIGH (ref 11.4–15.2)

## 2017-07-10 MED ORDER — ASPIRIN 81 MG PO CHEW: 81.0000 mg | CHEWABLE_TABLET | Freq: Every day | ORAL | Status: DC

## 2017-07-10 MED ORDER — ASPIRIN EC 81 MG PO TBEC
81.0000 mg | DELAYED_RELEASE_TABLET | Freq: Every day | ORAL | Status: DC
  Administered 2017-07-11 – 2017-07-12 (×2): 81 mg via ORAL
  Filled 2017-07-10 (×2): qty 1

## 2017-07-10 MED ORDER — WARFARIN SODIUM 5 MG PO TABS
5.0000 mg | ORAL_TABLET | Freq: Every day | ORAL | Status: DC
  Administered 2017-07-10: 5 mg via ORAL
  Filled 2017-07-10: qty 1

## 2017-07-10 MED ORDER — DEXTROMETHORPHAN POLISTIREX ER 30 MG/5ML PO SUER
30.0000 mg | Freq: Every day | ORAL | Status: DC
  Administered 2017-07-10 – 2017-07-11 (×2): 30 mg via ORAL
  Filled 2017-07-10 (×2): qty 5

## 2017-07-10 MED ORDER — GUAIFENESIN-DM 100-10 MG/5ML PO SYRP
5.0000 mL | ORAL_SOLUTION | ORAL | Status: DC | PRN
  Administered 2017-07-11 – 2017-07-12 (×2): 5 mL via ORAL
  Filled 2017-07-10 (×3): qty 5

## 2017-07-10 NOTE — Progress Notes (Addendum)
[address]       [place] [address]             [phone]      8 Days Post-Op Procedure(s) (LRB): BENTALL PROCEDURE (N/A) TRANSESOPHAGEAL ECHOCARDIOGRAM (TEE) (N/A) Subjective: C/o cough, chest soreness  Objective: Vital signs in last 24 hours: Temp:  [97.8 F (36.6 C)-99 F (37.2 C)] 97.8 F (36.6 C) (09/21 0755) Pulse Rate:  [69-82] 69 (09/21 0926) Cardiac Rhythm: Normal sinus rhythm (09/21 0755) Resp:  [16-26] 20 (09/21 0926) BP: (104-136)/(67-76) 120/76 (09/21 0755) SpO2:  [93 %-98 %] 98 % (09/21 0926) Weight:  [273 lb 14.4 oz (124.2 kg)] 273 lb 14.4 oz (124.2 kg) (09/21 0500)  Hemodynamic parameters for last 24 hours:    Intake/Output from previous day: 09/20 0701 - 09/21 0700 In: 720 [P.O.:720] Out: -  Intake/Output this shift: No intake/output data recorded.  General appearance: alert, cooperative and no distress Heart: regular rate and rhythm Lungs: dim in bases Abdomen: obese, + BS Extremities: trace edema Wound: incis healing well  Lab Results:  Recent Labs  07/08/17 0315 07/10/17 0532  WBC 11.3* 11.7*  HGB 9.1* 9.6*  HCT 28.1* 29.7*  PLT 134* 179   BMET:  Recent Labs  07/08/17 0315 07/10/17 0532  NA 137 134*  K 3.8 4.0  CL 102 102  CO2 27 25  GLUCOSE 127* 103*  BUN 9 11  CREATININE 0.96 1.00  CALCIUM 8.3* 8.3*    PT/INR:  Recent Labs  07/10/17 0532  LABPROT 21.5*  INR 1.89   ABG    Component Value Date/Time   PHART 7.458 (H) 07/03/2017 1846   HCO3 27.0 07/03/2017 1846   TCO2 26 07/07/2017 1627   ACIDBASEDEF 6.0 (H) 07/03/2017 0627   O2SAT 57.8 07/08/2017 0330   CBG (last 3)  No results for input(s): GLUCAP in the last 72 hours.  Meds Scheduled Meds: . amiodarone  400 mg Oral BID  . aspirin EC  325 mg Oral Daily   Or  . aspirin  324 mg Per Tube Daily  . azelastine  2 spray Each Nare BID  . benzonatate  200 mg Oral TID  . bisacodyl  10 mg Oral Daily   Or  . bisacodyl  10 mg Rectal  Daily  . budesonide (PULMICORT) nebulizer solution  0.5 mg Nebulization BID  . carvedilol  12.5 mg Oral BID WC  . Chlorhexidine Gluconate Cloth  6 each Topical Daily  . chlorpheniramine-HYDROcodone  5 mL Oral Q12H  . clonazePAM  1 mg Oral QHS  . colchicine  0.6 mg Oral Daily  . docusate sodium  200 mg Oral Daily  . enoxaparin (LOVENOX) injection  40 mg Subcutaneous Q24H  . finasteride  5 mg Oral QHS  . furosemide  40 mg Oral Daily  . Influenza vac split quadrivalent PF  0.5 mL Intramuscular Tomorrow-1000  . levothyroxine  112 mcg Oral QAC breakfast  . liothyronine  25 mcg Oral q morning - 10a  . mometasone-formoterol  2 puff Inhalation BID  . pantoprazole  40 mg Oral Daily  . pneumococcal 23 valent vaccine  0.5 mL Intramuscular Tomorrow-1000  . potassium chloride  20 mEq Oral BID  . sodium chloride flush  10-40 mL Intracatheter Q12H  . sodium chloride flush  3 mL Intravenous Q12H  . tamsulosin  0.4 mg Oral QPC supper  . thiamine  100 mg Oral Daily  . venlafaxine XR  150 mg Oral q  morning - 10a  . warfarin  7.5 mg Oral q1800  . warfarin   Does not apply Once  . Warfarin - Physician Dosing Inpatient   Does not apply q1800   Continuous Infusions: . sodium chloride Stopped (07/03/17 0900)   PRN Meds:.albuterol, ALPRAZolam, dextromethorphan, levalbuterol, menthol-cetylpyridinium, metoprolol tartrate, ondansetron (ZOFRAN) IV, oxyCODONE, phenol, sodium chloride, sodium chloride flush, sodium chloride flush, sorbitol, traMADol  Xrays Dg Chest 2 View  Result Date: 07/10/2017 CLINICAL DATA:  Status post aortic valve replacement. Follow-up pneumothorax. EXAM: CHEST  2 VIEW COMPARISON:  July 08, 2017 FINDINGS: The right-sided pneumothorax persists measuring 2.4 cm at the apex today versus 1.1 cm previously. The small pneumothorax accounting for approximately 10-15% of the thorax today has increased in the interval. A left PICC line terminates in the central SVC. No left-sided  pneumothorax. Subcutaneous air in the right chest wall has decreased but persists. Skin staples remain on the right. Cardiomegaly. The hila and mediastinum are unchanged. IMPRESSION: 1. Small right pneumothorax accounting for 10-15% of the thorax. This is larger in the interval accounting for 5-10% previously. Recommend clinical correlation and attention on follow-up. 2. Stable left PICC line. 3. Persistent subcutaneous air over the right chest wall, improved in the interval. These results will be called to the ordering clinician or representative by the Radiologist Assistant, and communication documented in the PACS or zVision Dashboard. Electronically Signed   By: Dorise Bullion III M.D   On: 07/10/2017 08:10    Assessment/Plan: S/P Procedure(s) (LRB): BENTALL PROCEDURE (N/A) TRANSESOPHAGEAL ECHOCARDIOGRAM (TEE) (N/A)  1 doing well 2 conts nebs/cough agents/nasal sprays , cpap 3 currently in sinus rhythm 4 H/H stable, WBC stable, platelets improved 5 renal fxn stable 6 sugars controlled, A1C 5.5 7 INR rising, may need to decrease coumadin dose soon 8 CXR slight increase in [neumothx, 10-15 % range with SQ air- monitor 9 anxious- multifactorial- cont currentRX   LOS: 8 days    GOLD,WAYNE E 07/10/2017  nsr Patient started on Coumadin for persistent postoperative atrial fibrillation-dosed currently 5 mg daily Incisions clean subQ air improved but not resolved Cont current care with CPAP daily at bedtime Home sun-mon  patient examined and medical record reviewed,agree with above note. Tharon Aquas Trigt III 07/10/2017

## 2017-07-10 NOTE — Progress Notes (Signed)
Occupational Therapy Treatment Patient Details Name: Grant [name] MRN: [id number] DOB: [birth year] Today's Date: 07/10/2017    History of present illness 66 year old hypertensive male with chronic fatigue syndrome and sleep apnea with a 5.4 cm aortic root aneurysm with moderate aortic insuficiency and bicuspid valve with needs a aortic root replacement plan for a biologic-Bentall procedure, which was done 9/13; recovery has been complicated by some delirium   OT comments  Pt demonstrating improvement toward OT goals this session. He was able to don and doff socks seated at EOB and educated concerning LB dressing strategies with sternal precautions. Pt additionally able to complete toilet transfers and functional mobility with min guard assist for safety. He continues to require verbal cues for adherence to sternal precautions during activity. D/C recommendations remains appropriate. Will continue to follow acutely.   Follow Up Recommendations  Home health OT;Supervision/Assistance - 24 hour (until wife feels safe leaving him alone)    Equipment Recommendations  3 in 1 bedside commode    Recommendations for Other Services      Precautions / Restrictions Precautions Precautions: Fall;Sternal Restrictions Weight Bearing Restrictions: No Other Position/Activity Restrictions: sternal       Mobility Bed Mobility Overal bed mobility: Needs Assistance Bed Mobility: Rolling;Sidelying to Sit;Sit to Sidelying Rolling: Supervision Sidelying to sit: Supervision     Sit to sidelying: Supervision General bed mobility comments: Supervision for safety and cues for technique.   Transfers Overall transfer level: Needs assistance Equipment used: Rolling walker (2 wheeled) Transfers: Sit to/from Stand Sit to Stand: Supervision         General transfer comment: Supervision for safety on sit<>stand. Min guard assist once beginning to ambulate. Cues to avoid pushing.     Balance Overall  balance assessment: Needs assistance Sitting-balance support: No upper extremity supported;Feet supported Sitting balance-Grant [name] Scale: Good     Standing balance support: No upper extremity supported Standing balance-Grant [name] Scale: Fair Standing balance comment: Min guard assist for balance during dynamic standing tasks.                            ADL either performed or assessed with clinical judgement   ADL Overall ADL's : Needs assistance/impaired     Grooming: Supervision/safety;Standing                   Toilet Transfer: Min guard;Ambulation Toilet Transfer Details (indicate cue type and reason): Simulated with sit<>stand followed by ambulation.  Toileting- Water quality scientist and Hygiene: Min guard;Sit to/from stand       Functional mobility during ADLs: Min guard General ADL Comments: Pt wearing sunglasses in the hallway due to light sensitivity.      Vision   Additional Comments: light sensitivity and pt wears sunglasses inside at times   Perception     Praxis      Cognition Arousal/Alertness: Awake/alert Behavior During Therapy: Impulsive;Flat affect Overall Cognitive Status: Impaired/Different from baseline Area of Impairment: Memory;Safety/judgement                   Current Attention Level: Sustained Memory: Decreased recall of precautions   Safety/Judgement: Decreased awareness of safety     General Comments: Pt able to verbalize sternal precautions but requires cues to adhere to these.        Exercises     Shoulder Instructions       General Comments VSS    Pertinent Vitals/ Pain       Pain  Assessment: Faces Faces Pain Scale: Hurts even more Pain Location: chest with cough Pain Descriptors / Indicators: Sore Pain Intervention(s): Limited activity within patient's tolerance (encouraged pillow splinting)  Home Living                                          Prior Functioning/Environment               Frequency  Min 3X/week        Progress Toward Goals  OT Goals(current goals can now be found in the care plan section)  Progress towards OT goals: Progressing toward goals  Acute Rehab OT Goals Patient Stated Goal: wife to get him home soon OT Goal Formulation: With family Time For Goal Achievement: 07/20/17 Potential to Achieve Goals: Good  Plan Discharge plan remains appropriate    Co-evaluation                 AM-PAC PT "6 Clicks" Daily Activity     Outcome Measure   Help from another person eating meals?: None Help from another person taking care of personal grooming?: A Little Help from another person toileting, which includes using toliet, bedpan, or urinal?: A Little Help from another person bathing (including washing, rinsing, drying)?: A Little Help from another person to put on and taking off regular upper body clothing?: A Little Help from another person to put on and taking off regular lower body clothing?: A Little 6 Click Score: 19    End of Session Equipment Utilized During Treatment: Gait belt  OT Visit Diagnosis: Unsteadiness on feet (R26.81);Pain Pain - part of body:  (chest-incision site)   Activity Tolerance Patient tolerated treatment well   Patient Left in bed;with call bell/phone within reach;with family/visitor present   Nurse Communication Mobility status;Patient requests pain meds (pt requests cough meds )        Time: 1530-1550 OT Time Calculation (min): 20 min  Charges: OT General Charges $OT Visit: 1 Visit OT Treatments $Self Care/Home Management : 8-22 mins  Grant Herrlich, MS OTR/L  Pager: [place] [name] 07/10/2017, 5:01 PM

## 2017-07-10 NOTE — Progress Notes (Signed)
PT has home CPAP and put on self.

## 2017-07-10 NOTE — Progress Notes (Signed)
Home CPAP.  RT will continue to monitor

## 2017-07-10 NOTE — Progress Notes (Deleted)
CARDIAC REHAB PHASE I   PRE:  Rate/Rhythm: 87 SR  BP:  Supine:   Sitting: 114/47  Standing:    SaO2: 100%RA  MODE:  Ambulation: 300 ft   POST:  Rate/Rhythm: 109 ST  BP:  Supine:   Sitting: 134/46  Standing:    SaO2: 99%RA 1010-1035 Pt walked 300 ft on RA with rolling walker and minimal asst. Did not want to go farther. Slightly SOB after walk. To recliner with call bell. Sats good on RA.   Graylon Good, RN BSN  07/10/2017 10:30 AM

## 2017-07-10 NOTE — Discharge Summary (Signed)
Physician Discharge Summary  Patient ID: Grant [name] MRN: [id number] DOB/AGE: Jan 09, 1951 66 y.o.  Admit date: 07/02/2017 Discharge date: 07/12/2017  Admission Diagnoses:  Patient Active Problem List   Diagnosis Date Noted  . Eczema 01/01/2014  . Hoarseness or changing voice 07/24/2013  . PAF (paroxysmal atrial fibrillation) ([place]) 05/18/2013  . Chronic anticoagulation 05/18/2013  . Preop cardiovascular exam 05/18/2013  . Macular rash 08/10/2012  . Acute asthmatic bronchitis 05/05/2012  . ESSENTIAL HYPERTENSION, BENIGN 06/17/2010  . SYNCOPE 06/14/2010  . DEPRESSION 02/11/2008  . Seasonal and perennial allergic rhinitis 02/11/2008  . Chronic bronchitis ([place]) 02/11/2008  . REFLUX ESOPHAGITIS, HX OF 02/11/2008   Discharge Diagnoses:   Patient Active Problem List   Diagnosis Date Noted  . 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 Eczema 01/01/2014  . Hoarseness or changing voice 07/24/2013  . PAF (paroxysmal atrial fibrillation) ([place]) 05/18/2013  . Chronic anticoagulation 05/18/2013  . Preop cardiovascular exam 05/18/2013  . Macular rash 08/10/2012  . Acute asthmatic bronchitis 05/05/2012  . ESSENTIAL HYPERTENSION, BENIGN 06/17/2010  . SYNCOPE 06/14/2010  . DEPRESSION 02/11/2008  . Seasonal and perennial allergic rhinitis 02/11/2008  . Chronic bronchitis ([place]) 02/11/2008  . REFLUX ESOPHAGITIS, HX OF 02/11/2008   Discharged Condition: good  History of Present Illness:  [name] is a 66 yo morbidly obese white male with known history of moderate aortic insufficiency and bicuspid aortic valve, Atrial Fibrillation, COPD, Anxiety, Chronic Fatigue syndrome.  The patient has been routinely followed, with most recent CTA showing his ascending aorta to be 5.4 cm.  It was felt the patient would require Aortic valve replacement and replacement of ascending aorta, he was referred to Dr. Prescott Gum for evaluation.  He was in agreement the patient would benefit from  surgical intervention.  The risks and benefits of the procedure were explained to the patient and he was agreeable to proceed.  He completed dental clearance prior to proceeding with surgery.    Hospital Course:   Mr. Trettin presented to [facility] on 07/02/2017.  He was taken to the operating room and underwent Bentall procedure under hypothermic circulatory arrest.  This was done with a 25 mm Pericardial valve with a 28 mm Valsalva graft. He tolerated the procedure without difficulty and was taken to the SICU in stable condition.  During his stay in the SICU the patient was weaned and extubated on POD #1.  He was weaned off Milrinone, Dopamine, Levophed, and Vasopressin as tolerated.  The patient had some mild post operative delirium.  He was displaying agitated behavior and unrealistic requests.  He did not seem to understand or was unwilling to follow caregivers recommendations.  Ultimately he continued to have issues with non-compliance and getting out of bed, resulting in ordering of safety sitter.  He developed Atrial Fibrillation and was treated with IV Amiodarone and coumadin.  He converted to NSR.  His chest tubes and arterial lines were removed without difficulty.  The patient was felt medically stable for transfer to the telemetry unit on POD #6.  The patient again developed Atrial Fibrillation after transfer.  He was treated with a bolus of IV Amiodarone.  He was also started on Coreg for additional HR control.  He converted to NSR with further titration of his Coreg.  His pacing wires were removed without difficulty.  He remains on Coumadin at 2.5 mg daily.  His INR is 2.5.  He will require PT/INR draw on Monday 07/15/2017.  He has continued  to have issues with anxiety and not following care givers recommendations.  He is ambulating independently.  He is tolerating a heart healthy diet.  He is felt medically stable for discharge home today.                  Significant Diagnostic  Studies: radiology:   CT scan:  Slight enlargement in measurement of the ascending thoracic aorta since the prior study. However, measurement likely not significantly different from the coronary CTA on 02/11/2016 at which time maximum aortic diameter was estimated to be 5.3 cm.  ECHO:   - Left ventricle: The cavity size was mildly dilated. There was   moderate concentric hypertrophy. Systolic function was normal.   The estimated ejection fraction was in the range of 55% to 60%.   Wall motion was normal; there were no regional wall motion   abnormalities. Features are consistent with a pseudonormal left   ventricular filling pattern, with concomitant abnormal relaxation   and increased filling pressure (grade 2 diastolic dysfunction).   Doppler parameters are consistent with high ventricular filling   pressure. - Aortic valve: Bicuspid; normal thickness, mildly calcified   leaflets. There was moderate regurgitation. - Aorta: Aortic root dimension: 50 mm (ED). - Aortic root: The aortic root was moderately dilated. - Mitral valve: There was trivial regurgitation. - Right ventricle: The cavity size was mildly dilated. Wall   thickness was normal.  Impressions:  - Normal LVF, bicupid aortic valve with moderate AR visually (mild   by PHT 516msec but eccentric jet), severely dilated ascending   aorta at 5.4cm and aortic root at 5cm. Compared to last study the   aortic root (4.7cm>>5cm) and ascending aortic dimensions   (5.2cm>>5.4cm) have increased in size.  Treatments: surgery:   1. Biologic Bentall procedure, aortic root replacement with aortic     valve conduit using a 28 mm Valsalva graft and a 25 mm Edwards     pericardial Magna Ease valve with reimplantation of the coronary     arteries and replacement of the ascending aorta to the arch (AVR     with an Edwards model 3300TFX, serial [id number] aortic valve). 2. Hypothermic circulatory arrest with antegrade cerebral  perfusion     utilizing right axillary artery cannulation for bypass. 3. Neuroprotective pharmacologic treatment by the Anesthesia team with     topical cooling of the brain and neuroprotective monitoring using     cerebral pulse oximetry.  Disposition: 01-Home or Self Care   Discharge medications:  The patient has been discharged on:   1.Beta Blocker:  Yes [ x  ]                              No   [   ]                              If No, reason:  2.Ace Inhibitor/ARB: Yes [   ]                                     No  [ x   ]  If No, reason: NO CAD, Labile BP  3.Statin:   Yes [   ]                  No  [ x  ]                  If No, reason: No CAD  4.[place]:  Yes  [ x  ]                  No   [   ]                  If No, reason:     Discharge Instructions    Amb Referral to Cardiac Rehabilitation    Complete by:  As directed    Diagnosis:  Valve Replacement   Valve:  Aortic     Allergies as of 07/12/2017      Reactions   Codeine Itching      Medication List    STOP taking these medications   losartan 100 MG tablet Commonly known as:  COZAAR   metoprolol succinate 100 MG 24 hr tablet Commonly known as:  TOPROL-XL   naproxen sodium 220 MG tablet Commonly known as:  ANAPROX   neomycin-polymyxin-hydrocortisone 3.5-10000-1 OTIC suspension Commonly known as:  CORTISPORIN   trolamine salicylate 10 % cream Commonly known as:  ASPERCREME   XARELTO 20 MG Tabs tablet Generic drug:  rivaroxaban     TAKE these medications   albuterol 108 (90 Base) MCG/ACT inhaler Commonly known as:  VENTOLIN HFA INHALE 2 PUFFS INTO LUNGS EVERY 6 HOURS AS NEEDED FOR WHEEZING OR SHORTNESS OF BREATH   ALIGN 4 MG Caps Take 4 mg by mouth daily with breakfast.   ALZAIR ALLERGY NASAL SPRAY NA Place 1 spray into both nostrils daily as needed (for allergies).   amiodarone 200 MG tablet Commonly known as:  PACERONE Take 1 tablet (200 mg total)  by mouth 2 (two) times daily.   aspirin 81 MG EC tablet Take 1 tablet (81 mg total) by mouth daily.   azelastine 0.1 % nasal spray Commonly known as:  ASTELIN Place 1-2 sprays into the nose 2 (two) times daily as needed for rhinitis or allergies.   benzonatate 200 MG capsule Commonly known as:  TESSALON Take 1 capsule (200 mg total) by mouth every 6 (six) hours as needed for cough.   BLUE-EMU MAXIMUM STRENGTH EX Apply 1 application topically 4 (four) times daily as needed (for arthritis pain.).   budesonide-formoterol 160-4.5 MCG/ACT inhaler Commonly known as:  SYMBICORT INHALE 2 PUFF INTO THE LUNGS 2 TIMES A DAY.   carvedilol 12.5 MG tablet Commonly known as:  COREG Take 1 tablet (12.5 mg total) by mouth 2 (two) times daily with a meal.   COLCRYS 0.6 MG tablet Generic drug:  colchicine Take 0.6 mg by mouth daily. With breakfast   dextromethorphan 30 MG/5ML liquid Commonly known as:  DELSYM Take 30 mg by mouth at bedtime as needed for cough.   DHEA 50 MG Tabs Take 50 mg by mouth daily with breakfast.   fexofenadine 180 MG tablet Commonly known as:  ALLEGRA Take 180 mg by mouth daily with breakfast.   finasteride 5 MG tablet Commonly known as:  PROSCAR Take 5 mg by mouth at bedtime.   levothyroxine 112 MCG tablet Commonly known as:  SYNTHROID, LEVOTHROID Take 112 mcg by mouth daily before breakfast.   liothyronine 25 MCG tablet Commonly known  as:  CYTOMEL Take 25 mcg by mouth every morning.   milk thistle 175 MG tablet Take 175 mg by mouth daily.   omeprazole 40 MG capsule Commonly known as:  PRILOSEC Take 1 capsule (40 mg total) by mouth 2 (two) times daily.   ONE-A-DAY MENS HEALTH FORMULA PO Take 1 tablet by mouth daily with breakfast.   perphenazine 2 MG tablet Commonly known as:  TRILAFON Take 2 mg by mouth 2 (two) times daily as needed (for migraines).   Phenylephrine-DM-GG 5-10-200 MG Tabs Take 2 tablets by mouth 2 (two) times daily as needed  (for congestion/cough).   tamsulosin 0.4 MG Caps capsule Commonly known as:  FLOMAX Take 0.4 mg by mouth daily after supper.   testosterone cypionate 100 MG/ML injection Commonly known as:  DEPOTESTOTERONE CYPIONATE Inject 200 mg into the muscle every Thursday. For IM use only   traMADol 50 MG tablet Commonly known as:  ULTRAM Take 1-2 tablets (50-100 mg total) by mouth every 6 (six) hours as needed for moderate pain.   venlafaxine XR 150 MG 24 hr capsule Commonly known as:  EFFEXOR-XR Take 150 mg by mouth every morning.   VITAMIN B12 SL Place 1,200 mcg under the tongue daily. 1 dropper   Vitamin D-3 5000 units Tabs Take 5,000 Units by mouth daily with breakfast.   warfarin 2.5 MG tablet Commonly known as:  COUMADIN Take 1 tablet (2.5 mg total) by mouth daily at 6 PM. Adjust dose as per coumadin clinic instructions            Discharge Care Instructions        Start     Ordered   07/13/17 0000  aspirin 81 MG EC tablet  Daily    Question:  Supervising Provider  Answer:  Prescott Gum, PETER   07/12/17 0950   07/12/17 0000  amiodarone (PACERONE) 200 MG tablet  2 times daily    Question:  Supervising Provider  Answer:  Prescott Gum, PETER   07/12/17 0950   07/12/17 0000  carvedilol (COREG) 12.5 MG tablet  2 times daily with meals    Question:  Supervising Provider  Answer:  Prescott Gum, PETER   07/12/17 0950   07/12/17 0000  traMADol (ULTRAM) 50 MG tablet  Every 6 hours PRN    Question:  Supervising Provider  Answer:  Prescott Gum, PETER   07/12/17 0950   07/12/17 0000  warfarin (COUMADIN) 2.5 MG tablet  Daily-1800    Question:  Supervising Provider  Answer:  Prescott Gum, PETER   07/12/17 0950   07/11/17 0000  Amb Referral to Cardiac Rehabilitation    Question Answer Comment  Diagnosis: Valve Replacement   Valve: Aortic      07/11/17 1105     Follow-up Information    [facility] Follow [facility].   Why:  HHRN/PT/OT arranged- they will call you to set up home  visits Contact information: [place] [address] [phone]        Ivin Poot, MD Follow up on 08/12/2017.   Specialty:  Cardiothoracic Surgery Why:  Appointment is 11:00, please get CXR at 10:30 at [facility] located on first floor of our office building Contact information: [address] [place] [address] [place] [facility] Follow up.   Specialty:  Cardiology Contact information: [address], [place]  SignedJadene Pierini E 07/12/2017, 9:50 AM

## 2017-07-10 NOTE — Discharge Instructions (Signed)
Aortic Valve Replacement, Care After °Refer to this sheet in the next few weeks. These instructions provide you with information about caring for yourself after your procedure. Your health care provider may also give you more specific instructions. Your treatment has been planned according to current medical practices, but problems sometimes occur. Call your health care provider if you have any problems or questions after your procedure. °What can I expect after the procedure? °After the procedure, it is common to have: °· Pain around your incision area. °· A small amount of blood or clear fluid coming from your incision. ° °Follow these instructions at home: °Eating and drinking ° °· Follow instructions from your health care provider about eating or drinking restrictions. °? Limit alcohol intake to no more than 1 drink per day for nonpregnant women and 2 drinks per day for men. One drink equals 12 oz of beer, 5 oz of wine, or 1½ oz of hard liquor. °? Limit how much caffeine you drink. Caffeine can affect your heart's rate and rhythm. °· Drink enough fluid to keep your urine clear or pale yellow. °· Eat a heart-healthy diet. This should include plenty of fresh fruits and vegetables. If you eat meat, it should be lean cuts. Avoid foods that are: °? High in salt, saturated fat, or sugar. °? Canned or highly processed. °? Fried. °Activity °· Return to your normal activities as told by your health care provider. Ask your health care provider what activities are safe for you. °· Exercise regularly once you have recovered, as told by your health care provider. °· Avoid sitting for more than 2 hours at a time without moving. Get up and move around at least once every 1-2 hours. This helps to prevent blood clots in the legs. °· Do not lift anything that is heavier than 10 lb (4.5 kg) until your health care provider approves. °· Avoid pushing or pulling things with your arms until your health care provider approves. This  includes pulling on handrails to help you climb stairs. °Incision care ° °· Follow instructions from your health care provider about how to take care of your incision. Make sure you: °? Wash your hands with soap and water before you change your bandage (dressing). If soap and water are not available, use hand sanitizer. °? Change your dressing as told by your health care provider. °? Leave stitches (sutures), skin glue, or adhesive strips in place. These skin closures may need to stay in place for 2 weeks or longer. If adhesive strip edges start to loosen and curl up, you may trim the loose edges. Do not remove adhesive strips completely unless your health care provider tells you to do that. °· Check your incision area every day for signs of infection. Check for: °? More redness, swelling, or pain. °? More fluid or blood. °? Warmth. °? Pus or a bad smell. °Medicines °· Take over-the-counter and prescription medicines only as told by your health care provider. °· If you were prescribed an antibiotic medicine, take it as told by your health care provider. Do not stop taking the antibiotic even if you start to feel better. °Travel °· Avoid airplane travel for as long as told by your health care provider. °· When you travel, bring a list of your medicines and a record of your medical history with you. Carry your medicines with you. °Driving °· Ask your health care provider when it is safe for you to drive. Do not drive until your health   care provider approves.  Do not drive or operate heavy machinery while taking prescription pain medicine. Lifestyle   Do not use any tobacco products, such as cigarettes, chewing tobacco, or e-cigarettes. If you need help quitting, ask your health care provider.  Resume sexual activity as told by your health care provider. Do not use medicines for erectile dysfunction unless your health care provider approves, if this applies.  Work with your health care provider to keep your  blood pressure and cholesterol under control, and to manage any other heart conditions that you have.  Maintain a healthy weight. General instructions  Do not take baths, swim, or use a hot tub until your health care provider approves.  Do not strain to have a bowel movement.  Avoid crossing your legs while sitting down.  Check your temperature every day for a fever. A fever may be a sign of infection.  If you are a woman and you plan to become pregnant, talk with your health care provider before you become pregnant.  Wear compression stockings if your health care provider instructs you to do this. These stockings help to prevent blood clots and reduce swelling in your legs.  Tell all health care providers who care for you that you have an artificial (prosthetic) aortic valve. If you have or have had heart disease or endocarditis, tell all health care providers about these conditions as well.  Keep all follow-up visits as told by your health care provider. This is important. Contact a health care provider if:  You develop a skin rash.  You experience sudden, unexplained changes in your weight.  You have more redness, swelling, or pain around your incision.  You have more fluid or blood coming from your incision.  Your incision feels warm to the touch.  You have pus or a bad smell coming from your incision.  You have a fever. Get help right away if:  You develop chest pain that is different from the pain coming from your incision.  You develop shortness of breath or difficulty breathing.  You start to feel light-headed. These symptoms may represent a serious problem that is an emergency. Do not wait to see if the symptoms will go away. Get medical help right away. Call your local emergency services (911 in the [place] Do not drive yourself to the hospital. This information is not intended to replace advice given to you by your health care provider. Make sure you discuss any  questions you have with your health care provider. Document Released: 04/24/2005 Document Revised: 03/13/2016 Document Reviewed: 09/09/2015 Elsevier Interactive Patient Education  2017 [place] and Warfarin Warfarin is a blood thinner (anticoagulant). Anticoagulant medicines help prevent the formation of blood clots. These medicines work by decreasing the activity of vitamin K, which promotes normal blood clotting. When you take warfarin, problems can occur from suddenly increasing or decreasing the amount of vitamin K that you eat from one day to the next. Problems may include:  Blood clots.  Bleeding.  What general guidelines do I need to follow? To avoid problems when taking warfarin:  Eat a balanced diet that includes: ? Fresh fruits and vegetables. ? Whole grains. ? Low-fat dairy products. ? Lean proteins, such as fish, eggs, and lean cuts of meat.  Keep your intake of vitamin K consistent from day to day. To do this: ? Avoid eating large amounts of vitamin K one day and low amounts of vitamin K the next day. ?  If you take a multivitamin that contains vitamin K, be sure to take it every day. ? Know which foods contain vitamin K. Use the lists below to understand serving sizes and the amount of vitamin K in one serving.  Avoid major changes in your diet. If you are going to change your diet, talk with your health care provider before making changes.  Work with a Financial planner (dietitian) to develop a meal plan that works best for you.  High vitamin K foods Foods that are high in vitamin K contain more than 100 mcg (micrograms) per serving. These include:  Broccoli (cooked) -  cup has 110 mcg.  Brussels sprouts (cooked) -  cup has 109 mcg.  Greens, beet (cooked) -  cup has 350 mcg.  Greens, collard (cooked) -  cup has 418 mcg.  Greens, turnip (cooked) -  cup has 265 mcg.  Green onions or scallions -  cup has 105 mcg.  Kale (fresh  or frozen) -  cup has 531 mcg.  Parsley (raw) - 10 sprigs has 164 mcg.  Spinach (cooked) -  cup has 444 mcg.  Swiss chard (cooked) -  cup has 287 mcg.  Moderate vitamin K foods Foods that have a moderate amount of vitamin K contain 25-100 mcg per serving. These include:  Asparagus (cooked) - 5 spears have 38 mcg.  Black-eyed peas (dried) -  cup has 32 mcg.  Cabbage (cooked) -  cup has 37 mcg.  Kiwi fruit - 1 medium has 31 mcg.  Lettuce - 1 cup has 57-63 mcg.  Okra (frozen) -  cup has 44 mcg.  Prunes (dried) - 5 prunes have 25 mcg.  Watercress (raw) - 1 cup has 85 mcg.  Low vitamin K foods Foods low in vitamin K contain less than 25 mcg per serving. These include:  Artichoke - 1 medium has 18 mcg.  Avocado - 1 oz. has 6 mcg.  Blueberries -  cup has 14 mcg.  Cabbage (raw) -  cup has 21 mcg.  Carrots (cooked) -  cup has 11 mcg.  Cauliflower (raw) -  cup has 11 mcg.  Cucumber with peel (raw) -  cup has 9 mcg.  Grapes -  cup has 12 mcg.  Mango - 1 medium has 9 mcg.  Nuts - 1 oz. has 15 mcg.  Pear - 1 medium has 8 mcg.  Peas (cooked) -  cup has 19 mcg.  Pickles - 1 spear has 14 mcg.  Pumpkin seeds - 1 oz. has 13 mcg.  Sauerkraut (canned) -  cup has 16 mcg.  Soybeans (cooked) -  cup has 16 mcg.  Tomato (raw) - 1 medium has 10 mcg.  Tomato sauce -  cup has 17 mcg.  Vitamin K-free foods If a food contain less than 5 mcg per serving, it is considered to have no vitamin K. These foods include:  Bread and cereal products.  Cheese.  Eggs.  Fish and shellfish.  Meat and poultry.  Milk and dairy products.  Sunflower seeds.  Actual amounts of vitamin K in foods may be different depending on processing. Talk with your dietitian about what foods you can eat and what foods you should avoid.  This information is not intended to replace advice given to you by your health care provider. Make sure you discuss any questions you have with  your health care provider. Document Released: 08/03/2009 Document Revised: 04/27/2016 Document Reviewed: 01/09/2016 Elsevier Interactive Patient Education  2017 [employer].  ===================================================================================================  Information on my medicine - Coumadin   (Warfarin)  This medication education was reviewed with me or my healthcare representative as part of my discharge preparation.  The pharmacist that spoke with me during my hospital stay was:  Arty Baumgartner, [facility]  Why was Coumadin prescribed for you? Coumadin was prescribed for you because you have a blood clot or a medical condition that can cause an increased risk of forming blood clots. Blood clots can cause serious health problems by blocking the flow of blood to the heart, lung, or brain. Coumadin can prevent harmful blood clots from forming. As a reminder your indication for Coumadin is:   Stroke Prevention Because Of Atrial Fibrillation  What test will check on my response to Coumadin? While on Coumadin (warfarin) you will need to have an INR test regularly to ensure that your dose is keeping you in the desired range. The INR (international normalized ratio) number is calculated from the result of the laboratory test called prothrombin time (PT).  If an INR APPOINTMENT HAS NOT ALREADY BEEN MADE FOR YOU please schedule an appointment to have this lab work done by your health care provider within 7 days. Your INR goal is usually a number between:  2 to 3 or your provider may give you a more narrow range like 2-2.5.  Ask your health care provider during an office visit what your goal INR is.  What  do you need to  know  About  COUMADIN? Take Coumadin (warfarin) exactly as prescribed by your healthcare provider about the same time each day.  DO NOT stop taking without talking to the doctor who prescribed the medication.  Stopping without other blood clot prevention  medication to take the place of Coumadin may increase your risk of developing a new clot or stroke.  Get refills before you run out.  What do you do if you miss a dose? If you miss a dose, take it as soon as you remember on the same day then continue your regularly scheduled regimen the next day.  Do not take two doses of Coumadin at the same time.  Important Safety Information A possible side effect of Coumadin (Warfarin) is an increased risk of bleeding. You should call your healthcare provider right away if you experience any of the following: ? Bleeding from an injury or your nose that does not stop. ? Unusual colored urine (red or dark brown) or unusual colored stools (red or black). ? Unusual bruising for unknown reasons. ? A serious fall or if you hit your head (even if there is no bleeding).  Some foods or medicines interact with Coumadin (warfarin) and might alter your response to warfarin. To help avoid this: ? Eat a balanced diet, maintaining a consistent amount of Vitamin K. ? Notify your provider about major diet changes you plan to make. ? Avoid alcohol or limit your intake to 1 drink for women and 2 drinks for men per day. (1 drink is 5 oz. wine, 12 oz. beer, or 1.5 oz. liquor.)  Make sure that ANY health care provider who prescribes medication for you knows that you are taking Coumadin (warfarin).  Also make sure the healthcare provider who is monitoring your Coumadin knows when you have started a new medication including herbals and non-prescription products.  Coumadin (Warfarin)  Major Drug Interactions  Increased Warfarin Effect Decreased Warfarin Effect  Alcohol (large quantities) Antibiotics (esp. Septra/Bactrim, Flagyl, Cipro) Amiodarone (Cordarone) Aspirin (ASA) Cimetidine (Tagamet) Megestrol (Megace) NSAIDs (ibuprofen,  naproxen, etc.) Piroxicam (Feldene) Propafenone (Rythmol SR) Propranolol (Inderal) Isoniazid (INH) Posaconazole (Noxafil) Barbiturates  (Phenobarbital) Carbamazepine (Tegretol) Chlordiazepoxide (Librium) Cholestyramine (Questran) Griseofulvin Oral Contraceptives Rifampin Sucralfate (Carafate) Vitamin K   Coumadin (Warfarin) Major Herbal Interactions  Increased Warfarin Effect Decreased Warfarin Effect  Garlic Ginseng Ginkgo biloba Coenzyme Q10 Green tea St. Johns wort    Coumadin (Warfarin) FOOD Interactions  Eat a consistent number of servings per week of foods HIGH in Vitamin K (1 serving =  cup)  Collards (cooked, or boiled & drained) Kale (cooked, or boiled & drained) Mustard greens (cooked, or boiled & drained) Parsley *serving size only =  cup Spinach (cooked, or boiled & drained) Swiss chard (cooked, or boiled & drained) Turnip greens (cooked, or boiled & drained)  Eat a consistent number of servings per week of foods MEDIUM-HIGH in Vitamin K (1 serving = 1 cup)  Asparagus (cooked, or boiled & drained) Broccoli (cooked, boiled & drained, or raw & chopped) Brussel sprouts (cooked, or boiled & drained) *serving size only =  cup Lettuce, raw (green leaf, endive, romaine) Spinach, raw Turnip greens, raw & chopped   These websites have more information on Coumadin (warfarin):  FailFactory.se; VeganReport.com.au;

## 2017-07-10 NOTE — Progress Notes (Signed)
CARDIAC REHAB PHASE I   PRE:  Rate/Rhythm: 69 SR    BP: sitting 116/75    SaO2: 95 RA  MODE:  Ambulation: 780 ft   POST:  Rate/Rhythm: 86 SR    BP: sitting 122/76     SaO2: 97 RA  Pt out of bed and walked independently. Legs fatigued toward end and began "jumping". Pt sts he just doesn't feel well. He tries not to complain because he doesn't like people bothering him. He doesn't like the recliner at all and declined sitting up. Encouraged IS and more walking. Will f/u tomorrow. He and his wife prefer education be done with his wife. [place], ACSM 07/10/2017 11:35 AM

## 2017-07-11 LAB — PROTIME-INR
INR: 2.3
Prothrombin Time: 25.1 seconds — ABNORMAL HIGH (ref 11.4–15.2)

## 2017-07-11 MED ORDER — WARFARIN SODIUM 2.5 MG PO TABS
2.5000 mg | ORAL_TABLET | Freq: Every day | ORAL | Status: DC
  Administered 2017-07-11: 2.5 mg via ORAL
  Filled 2017-07-11: qty 1

## 2017-07-11 NOTE — Progress Notes (Signed)
Pt and wife preferred that education be done with only wife. Discussed ed with pt, very receptive. She sts pt is interested in [facility] and I will send referral to [place]. She is eager to speak with CM.  [id number] Yves Dill CES, ACSM 11:02 AM 07/11/2017

## 2017-07-11 NOTE — Progress Notes (Addendum)
[name]       [employer] [id number]             [phone]      9 Days Post-Op Procedure(s) (LRB): BENTALL PROCEDURE (N/A) TRANSESOPHAGEAL ECHOCARDIOGRAM (TEE) (N/A) Subjective: Feels pretty well, no specific c/o except didn't sleep well  Objective: Vital signs in last 24 hours: Temp:  [97.9 F (36.6 C)-99.7 F (37.6 C)] 97.9 F (36.6 C) (09/22 0620) Pulse Rate:  [67-78] 74 (09/21 2231) Cardiac Rhythm: Normal sinus rhythm (09/22 0714) Resp:  [14-24] 18 (09/22 0620) BP: (101-123)/(58-78) 123/78 (09/22 0620) SpO2:  [92 %-100 %] 100 % (09/22 0620) Weight:  [275 lb (124.7 kg)] 275 lb (124.7 kg) (09/22 0443)  Hemodynamic parameters for last 24 hours:    Intake/Output from previous day: 09/21 0701 - 09/22 0700 In: 370 [P.O.:370] Out: -  Intake/Output this shift: No intake/output data recorded.  General appearance: alert, cooperative and no distress Heart: regular rate and rhythm Lungs: clear to auscultation bilaterally Abdomen: benign Extremities: no edema Wound: incis healing well  Lab Results:  Recent Labs  07/10/17 0532  WBC 11.7*  HGB 9.6*  HCT 29.7*  PLT 179   BMET:  Recent Labs  07/10/17 0532  NA 134*  K 4.0  CL 102  CO2 25  GLUCOSE 103*  BUN 11  CREATININE 1.00  CALCIUM 8.3*    PT/INR:  Recent Labs  07/11/17 0459  LABPROT 25.1*  INR 2.30   ABG    Component Value Date/Time   PHART 7.458 (H) 07/03/2017 1846   HCO3 27.0 07/03/2017 1846   TCO2 26 07/07/2017 1627   ACIDBASEDEF 6.0 (H) 07/03/2017 0627   O2SAT 57.8 07/08/2017 0330   CBG (last 3)  No results for input(s): GLUCAP in the last 72 hours.  Meds Scheduled Meds: . amiodarone  400 mg Oral BID  . aspirin EC  81 mg Oral Daily   Or  . aspirin  81 mg Per Tube Daily  . azelastine  2 spray Each Nare BID  . benzonatate  200 mg Oral TID  . bisacodyl  10 mg Oral Daily   Or  . bisacodyl  10 mg Rectal Daily  . budesonide (PULMICORT) nebulizer solution   0.5 mg Nebulization BID  . carvedilol  12.5 mg Oral BID WC  . Chlorhexidine Gluconate Cloth  6 each Topical Daily  . clonazePAM  1 mg Oral QHS  . colchicine  0.6 mg Oral Daily  . dextromethorphan  30 mg Oral QHS  . docusate sodium  200 mg Oral Daily  . finasteride  5 mg Oral QHS  . furosemide  40 mg Oral Daily  . Influenza vac split quadrivalent PF  0.5 mL Intramuscular Tomorrow-1000  . levothyroxine  112 mcg Oral QAC breakfast  . liothyronine  25 mcg Oral q morning - 10a  . mometasone-formoterol  2 puff Inhalation BID  . pantoprazole  40 mg Oral Daily  . pneumococcal 23 valent vaccine  0.5 mL Intramuscular Tomorrow-1000  . potassium chloride  20 mEq Oral BID  . sodium chloride flush  10-40 mL Intracatheter Q12H  . sodium chloride flush  3 mL Intravenous Q12H  . tamsulosin  0.4 mg Oral QPC supper  . thiamine  100 mg Oral Daily  . venlafaxine XR  150 mg Oral q morning - 10a  . warfarin  5 mg Oral q1800  . warfarin   Does not apply Once  . Warfarin - Physician  Dosing Inpatient   Does not apply q1800   Continuous Infusions: . sodium chloride Stopped (07/03/17 0900)   PRN Meds:.albuterol, ALPRAZolam, guaiFENesin-dextromethorphan, levalbuterol, menthol-cetylpyridinium, ondansetron (ZOFRAN) IV, oxyCODONE, phenol, sodium chloride, sodium chloride flush, sodium chloride flush, sorbitol, traMADol  Xrays Dg Chest 2 View  Result Date: 07/10/2017 CLINICAL DATA:  Status post aortic valve replacement. Follow-up pneumothorax. EXAM: CHEST  2 VIEW COMPARISON:  July 08, 2017 FINDINGS: The right-sided pneumothorax persists measuring 2.4 cm at the apex today versus 1.1 cm previously. The small pneumothorax accounting for approximately 10-15% of the thorax today has increased in the interval. A left PICC line terminates in the central SVC. No left-sided pneumothorax. Subcutaneous air in the right chest wall has decreased but persists. Skin staples remain on the right. Cardiomegaly. The hila and  mediastinum are unchanged. IMPRESSION: 1. Small right pneumothorax accounting for 10-15% of the thorax. This is larger in the interval accounting for 5-10% previously. Recommend clinical correlation and attention on follow-up. 2. Stable left PICC line. 3. Persistent subcutaneous air over the right chest wall, improved in the interval. These results will be called to the ordering clinician or representative by the Radiologist Assistant, and communication documented in the PACS or zVision Dashboard. Electronically Signed   By: Dorise Bullion III M.D   On: 07/10/2017 08:10    Assessment/Plan: S/P Procedure(s) (LRB): BENTALL PROCEDURE (N/A) TRANSESOPHAGEAL ECHOCARDIOGRAM (TEE) (N/A)  1 progressing well 2 sinus rhythm on current rx 3 no new labs except INR 2.3, reduce coumadin to 2.5 mg for now 4 pulm status/cough improved on current Rx 5 CBG's  Good control 6 they hope for discharge tomorrow- likely unless new issues present 7 repeat CXR in am to F/U small pntx 8 anxiety seem much better today  9 d/c every other staple today  LOS: 9 days    [name],Grant E 07/11/2017   I have seen and examined the patient and agree with the assessment and plan as outlined.  Tentatively for d/c home tomorrow  Rexene Alberts, MD 07/11/2017 12:14 PM

## 2017-07-12 ENCOUNTER — Other Ambulatory Visit: Payer: Self-pay | Admitting: Surgical

## 2017-07-12 ENCOUNTER — Inpatient Hospital Stay (HOSPITAL_COMMUNITY): Payer: Medicare Other

## 2017-07-12 LAB — PROTIME-INR
INR: 2.34
Prothrombin Time: 25.5 seconds — ABNORMAL HIGH (ref 11.4–15.2)

## 2017-07-12 MED ORDER — ALTEPLASE 2 MG IJ SOLR
2.0000 mg | Freq: Once | INTRAMUSCULAR | Status: AC
  Administered 2017-07-12: 2 mg

## 2017-07-12 MED ORDER — CARVEDILOL 12.5 MG PO TABS: 12.5000 mg | ORAL_TABLET | Freq: Two times a day (BID) | ORAL | 1 refills | Status: DC

## 2017-07-12 MED ORDER — WARFARIN SODIUM 2.5 MG PO TABS: 2.5000 mg | ORAL_TABLET | Freq: Every day | ORAL | 1 refills | Status: DC

## 2017-07-12 MED ORDER — ASPIRIN 81 MG PO TBEC: 81.0000 mg | DELAYED_RELEASE_TABLET | Freq: Every day | ORAL | Status: DC

## 2017-07-12 MED ORDER — AMIODARONE HCL 200 MG PO TABS: 200.0000 mg | ORAL_TABLET | Freq: Two times a day (BID) | ORAL | 1 refills | Status: DC

## 2017-07-12 MED ORDER — TRAMADOL HCL 50 MG PO TABS: 50.0000 mg | ORAL_TABLET | Freq: Four times a day (QID) | ORAL | 0 refills | Status: DC | PRN

## 2017-07-12 NOTE — Progress Notes (Signed)
[address]       [place] [address]             [phone]      10 Days Post-Op Procedure(s) (LRB): BENTALL PROCEDURE (N/A) TRANSESOPHAGEAL ECHOCARDIOGRAM (TEE) (N/A) Subjective: Feels ok, no new issues  Objective: Vital signs in last 24 hours: Temp:  [97.8 F (36.6 C)-99.2 F (37.3 C)] 99.2 F (37.3 C) (09/23 0858) Pulse Rate:  [65-72] 65 (09/23 0858) Cardiac Rhythm: Normal sinus rhythm (09/23 0700) Resp:  [12-19] 19 (09/23 0858) BP: (100-121)/(56-76) 103/67 (09/23 0858) SpO2:  [93 %-100 %] 93 % (09/23 0858) Weight:  [275 lb 4.8 oz (124.9 kg)] 275 lb 4.8 oz (124.9 kg) (09/23 0404)  Hemodynamic parameters for last 24 hours:    Intake/Output from previous day: 09/22 0701 - 09/23 0700 In: 77 [P.O.:600; I.V.:23] Out: 300 [Urine:300] Intake/Output this shift: Total I/O In: 360 [P.O.:360] Out: -   General appearance: alert, cooperative and no distress Heart: regular rate and rhythm Lungs: clear to auscultation bilaterally Abdomen: obese, benign Extremities: no edema Wound: incis healing well  Lab Results:  Recent Labs  07/10/17 0532  WBC 11.7*  HGB 9.6*  HCT 29.7*  PLT 179   BMET:  Recent Labs  07/10/17 0532  NA 134*  K 4.0  CL 102  CO2 25  GLUCOSE 103*  BUN 11  CREATININE 1.00  CALCIUM 8.3*    PT/INR:  Recent Labs  07/12/17 0725  LABPROT 25.5*  INR 2.34   ABG    Component Value Date/Time   PHART 7.458 (H) 07/03/2017 1846   HCO3 27.0 07/03/2017 1846   TCO2 26 07/07/2017 1627   ACIDBASEDEF 6.0 (H) 07/03/2017 0627   O2SAT 57.8 07/08/2017 0330   CBG (last 3)  No results for input(s): GLUCAP in the last 72 hours.  Meds Scheduled Meds: . amiodarone  400 mg Oral BID  . aspirin EC  81 mg Oral Daily   Or  . aspirin  81 mg Per Tube Daily  . azelastine  2 spray Each Nare BID  . benzonatate  200 mg Oral TID  . bisacodyl  10 mg Oral Daily   Or  . bisacodyl  10 mg Rectal Daily  . budesonide (PULMICORT)  nebulizer solution  0.5 mg Nebulization BID  . carvedilol  12.5 mg Oral BID WC  . Chlorhexidine Gluconate Cloth  6 each Topical Daily  . clonazePAM  1 mg Oral QHS  . colchicine  0.6 mg Oral Daily  . dextromethorphan  30 mg Oral QHS  . docusate sodium  200 mg Oral Daily  . finasteride  5 mg Oral QHS  . furosemide  40 mg Oral Daily  . Influenza vac split quadrivalent PF  0.5 mL Intramuscular Tomorrow-1000  . levothyroxine  112 mcg Oral QAC breakfast  . liothyronine  25 mcg Oral q morning - 10a  . mometasone-formoterol  2 puff Inhalation BID  . pantoprazole  40 mg Oral Daily  . pneumococcal 23 valent vaccine  0.5 mL Intramuscular Tomorrow-1000  . potassium chloride  20 mEq Oral BID  . sodium chloride flush  10-40 mL Intracatheter Q12H  . sodium chloride flush  3 mL Intravenous Q12H  . tamsulosin  0.4 mg Oral QPC supper  . thiamine  100 mg Oral Daily  . venlafaxine XR  150 mg Oral q morning - 10a  . warfarin  2.5 mg Oral q1800  . warfarin   Does not apply Once  .  Warfarin - Physician Dosing Inpatient   Does not apply q1800   Continuous Infusions: . sodium chloride Stopped (07/03/17 0900)   PRN Meds:.albuterol, ALPRAZolam, guaiFENesin-dextromethorphan, levalbuterol, menthol-cetylpyridinium, ondansetron (ZOFRAN) IV, oxyCODONE, phenol, sodium chloride, sodium chloride flush, sodium chloride flush, sorbitol, traMADol  Xrays Dg Chest 2 View  Result Date: 07/12/2017 CLINICAL DATA:  Follow-up pneumothorax. EXAM: CHEST  2 VIEW COMPARISON:  July 10, 2017 FINDINGS: The left-sided pneumothorax persists. It is a little smaller in the interval measuring 2.3 cm at the apex today versus 3.2 cm previously. No left-sided pneumothorax. Cardiomegaly. Stable hila and mediastinum. No focal infiltrate in the lungs. Air in the subcutaneous tissues of the right chest wall and base of neck persists. IMPRESSION: 1. Persistent small right-sided pneumothorax, a little smaller in the interval. Persistent air in  the subcutaneous tissues of the right chest wall. No other changes. Electronically Signed   By: Dorise Bullion III M.D   On: 07/12/2017 07:45    Assessment/Plan: S/P Procedure(s) (LRB): BENTALL PROCEDURE (N/A) TRANSESOPHAGEAL ECHOCARDIOGRAM (TEE) (N/A) Plan for discharge: see discharge orders D/c rest of staples and CT sutures Cont coumadin at 2.5mg  till INR check   LOS: 10 days    [place]ola Seto E 07/12/2017

## 2017-07-12 NOTE — Care Management Note (Signed)
Case Management Note Original Note Created Zenon Mayo, RN 07/06/2017, 3:42 PM   Patient Details  Name: Grant [name] MRN: [id number] Date of Birth: [birth year]  Subjective/Objective: From home with spouse, pta indep, he has a lift chair, and a cpap machine at home, he is  post op Bentall procedure, had some delerim last pm, now extubated conts on milrinone, levophed, cont insulin, lasix, iv abx and precedex.  Wife is retired she will be with patient at discharge 24/7.    9/17 [place], BSN-  one chest tube dc'd today, still with a chest tube to suction,he has Sub Q air in chest and face, picc placed today.  NCM spoke with wife, Grant [name] , gave her the [facility] agency list to choose from for [place], [place], she would like to look at the list and let me know later.  She states she would like to go thru [facility] for the DME, 3 n 1 and rolling walker.     9/19 [place], BSN - received call from wife, she chose [facility] for HHPT/HHOT , referral given to Butch Penny, also wife requested a calm person to work with her spouse because of chronic fatigue, this information was given to Butch Penny, wife prefers [employer] to speak with her and not the patient to get things set up.  He will also need 3 n 1 and walker . [facility] will not be able to provide blue pads for patient.                                Action/Plan: NCM will follow for dc needs.   Expected Discharge Date:  07/12/17               Expected Discharge Plan:  [place]  In-House Referral:     Discharge planning Services  CM Consult  Post Acute Care Choice:  Durable Medical Equipment, Home Health Choice offered to:  Patient, Spouse  DME Arranged:  3-N-1, Walker rolling DME Agency:  [place] Arranged:  RN, PT, OT [facility] Agency:  [place]  Status of Service:  Completed, signed off  If discussed at [place] of Stay Meetings, dates discussed:     Discharge  Disposition: home/home health   Additional Comments:  07/12/17- 1140- Bralee Feldt RN, CM- pt for d/c home today - orders placed for [facility] and DME-  RW and 3n1 to be delivered to room prior to discharge- have notified Jermaine with [facility] for DME and [place] needs- HH orders have been updated to include INR check needs.   Dahlia Client Jasper, RN 07/12/2017, 11:42 AM [phone]

## 2017-07-14 ENCOUNTER — Encounter (HOSPITAL_COMMUNITY): Payer: Self-pay | Admitting: Physician Assistant

## 2017-07-15 ENCOUNTER — Telehealth (HOSPITAL_COMMUNITY): Payer: Self-pay

## 2017-07-15 ENCOUNTER — Encounter: Payer: Self-pay | Admitting: Physician Assistant

## 2017-07-15 ENCOUNTER — Telehealth: Payer: Self-pay | Admitting: Physician Assistant

## 2017-07-15 ENCOUNTER — Ambulatory Visit (INDEPENDENT_AMBULATORY_CARE_PROVIDER_SITE_OTHER): Payer: Medicare Other | Admitting: Internal Medicine

## 2017-07-15 ENCOUNTER — Other Ambulatory Visit: Payer: Self-pay | Admitting: *Deleted

## 2017-07-15 ENCOUNTER — Ambulatory Visit (INDEPENDENT_AMBULATORY_CARE_PROVIDER_SITE_OTHER): Payer: Self-pay | Admitting: Cardiothoracic Surgery

## 2017-07-15 ENCOUNTER — Other Ambulatory Visit: Payer: Self-pay

## 2017-07-15 ENCOUNTER — Ambulatory Visit
Admission: RE | Admit: 2017-07-15 | Discharge: 2017-07-15 | Disposition: A | Discharge status: Home / Self Care | Payer: Medicare Other | Source: Ambulatory Visit | Attending: Physician Assistant | Admitting: Physician Assistant

## 2017-07-15 ENCOUNTER — Other Ambulatory Visit: Payer: Self-pay | Admitting: Physician Assistant

## 2017-07-15 VITALS — BP 140/76 | HR 62 | Temp 97.2°F | Resp 17 | Ht 71.0 in | Wt 270.0 lb

## 2017-07-15 DIAGNOSIS — Z954 Presence of other heart-valve replacement: Secondary | ICD-10-CM

## 2017-07-15 DIAGNOSIS — G4733 Obstructive sleep apnea (adult) (pediatric): Secondary | ICD-10-CM | POA: Diagnosis not present

## 2017-07-15 DIAGNOSIS — I712 Thoracic aortic aneurysm, without rupture, unspecified: Secondary | ICD-10-CM

## 2017-07-15 DIAGNOSIS — R0602 Shortness of breath: Secondary | ICD-10-CM

## 2017-07-15 DIAGNOSIS — J939 Pneumothorax, unspecified: Secondary | ICD-10-CM | POA: Diagnosis not present

## 2017-07-15 DIAGNOSIS — I351 Nonrheumatic aortic (valve) insufficiency: Secondary | ICD-10-CM

## 2017-07-15 DIAGNOSIS — I480 Paroxysmal atrial fibrillation: Secondary | ICD-10-CM

## 2017-07-15 DIAGNOSIS — M797 Fibromyalgia: Secondary | ICD-10-CM | POA: Diagnosis not present

## 2017-07-15 DIAGNOSIS — Z48812 Encounter for surgical aftercare following surgery on the circulatory system: Secondary | ICD-10-CM | POA: Diagnosis not present

## 2017-07-15 DIAGNOSIS — Z5181 Encounter for therapeutic drug level monitoring: Secondary | ICD-10-CM

## 2017-07-15 DIAGNOSIS — Z7901 Long term (current) use of anticoagulants: Secondary | ICD-10-CM | POA: Diagnosis not present

## 2017-07-15 DIAGNOSIS — Z952 Presence of prosthetic heart valve: Secondary | ICD-10-CM

## 2017-07-15 LAB — POCT INR: INR: 2.2

## 2017-07-15 NOTE — Progress Notes (Unsigned)
2dg2

## 2017-07-15 NOTE — Progress Notes (Signed)
Grant [name] is a 66 y.o. male patient who presents because his wife was worried about him acting "stoned". Ever since he left the hospital on Sunday 9/23 he has been unsteady on his feet, he hasn't been sleeping, he has felt sick and has not eaten much. He could not swallow his medications this morning therefore he did not take his Coreg. She feels as though he has no energy. He stopped taking his pain medicine 2 days ago without any improvement. He has also had a few violent coughing episodes.He has been using his CPAP device more frequently than usual    1. Aortic valve insufficiency, etiology of cardiac valve disease unspecified   2. S/P AVR    Past Medical History:  Diagnosis Date  . Allergic rhinitis   . Anxiety   . Arthritis   . Asthma   . Bladder tumor   . Chronic fatigue   . Chronic fatigue   . Depression    06/30/17 Pt denies being depressed, reports Effexor is taken for Chronic Fatigue   . Dyspnea   . Enlarged prostate   . Fibromyalgia   . GERD (gastroesophageal reflux disease)   . Headache   . History of chronic bronchitis   . History of migraine   . History of toxic encephalopathy   . Hypothyroidism   . OSA on CPAP    CPAP 14  . PAF (paroxysmal atrial fibrillation) ([place]) CARDIOLOGIST -- DR [name]   DX OCT 2013  . 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 Unspecified essential hypertension   . Urethral tumor    PROSTATIC   No past surgical history pertinent negatives on file. Scheduled Meds: Current Outpatient Prescriptions on File Prior to Visit  Medication Sig Dispense Refill  . albuterol (VENTOLIN HFA) 108 (90 Base) MCG/ACT inhaler INHALE 2 PUFFS INTO LUNGS EVERY 6 HOURS AS NEEDED FOR WHEEZING OR SHORTNESS OF BREATH 18 g 12  . amiodarone (PACERONE) 200 MG tablet Take 1 tablet (200 mg total) by mouth 2 (two) times daily. (Patient taking differently: Take 200 mg by mouth daily. ) 60 tablet 1  . aspirin 81 MG EC tablet Take 1 tablet (81 mg total) by mouth daily.    Marland Kitchen  azelastine (ASTELIN) 137 MCG/SPRAY nasal spray Place 1-2 sprays into the nose 2 (two) times daily as needed for rhinitis or allergies.     . benzonatate (TESSALON) 200 MG capsule Take 1 capsule (200 mg total) by mouth every 6 (six) hours as needed for cough. 30 capsule 0  . budesonide-formoterol (SYMBICORT) 160-4.5 MCG/ACT inhaler INHALE 2 PUFF INTO THE LUNGS 2 TIMES A DAY. 10.2 g 12  . carvedilol (COREG) 12.5 MG tablet Take 1 tablet (12.5 mg total) by mouth 2 (two) times daily with a meal. 60 tablet 1  . Cholecalciferol (VITAMIN D-3) 5000 UNITS TABS Take 5,000 Units by mouth daily with breakfast.     . COLCRYS 0.6 MG tablet Take 0.6 mg by mouth daily. With breakfast    . Cyanocobalamin (VITAMIN B12 SL) Place 1,200 mcg under the tongue daily. 1 dropper    . dextromethorphan (DELSYM) 30 MG/5ML liquid Take 30 mg by mouth at bedtime as needed for cough.     Marland Kitchen DHEA 50 MG TABS Take 50 mg by mouth daily with breakfast.     . fexofenadine (ALLEGRA) 180 MG tablet Take 180 mg by mouth daily with breakfast.     . finasteride (PROSCAR) 5 MG tablet Take 5 mg by mouth at bedtime.     Marland Kitchen  Hypromellose (ALZAIR ALLERGY NASAL SPRAY NA) Place 1 spray into both nostrils daily as needed (for allergies).     Marland Kitchen levothyroxine (SYNTHROID, LEVOTHROID) 112 MCG tablet Take 112 mcg by mouth daily before breakfast.     . liothyronine (CYTOMEL) 25 MCG tablet Take 25 mcg by mouth every morning.     . Menthol, Topical Analgesic, (BLUE-EMU MAXIMUM STRENGTH EX) Apply 1 application topically 4 (four) times daily as needed (for arthritis pain.).    Marland Kitchen milk thistle 175 MG tablet Take 175 mg by mouth daily.    . Multiple Vitamins-Minerals (ONE-A-DAY MENS HEALTH FORMULA PO) Take 1 tablet by mouth daily with breakfast.     . omeprazole (PRILOSEC) 40 MG capsule Take 1 capsule (40 mg total) by mouth 2 (two) times daily. 180 capsule 1  . perphenazine (TRILAFON) 2 MG tablet Take 2 mg by mouth 2 (two) times daily as needed (for migraines).      . Probiotic Product (ALIGN) 4 MG CAPS Take 4 mg by mouth daily with breakfast.     . tamsulosin (FLOMAX) 0.4 MG CAPS capsule Take 0.4 mg by mouth daily after supper.    . testosterone cypionate (DEPOTESTOTERONE CYPIONATE) 100 MG/ML injection Inject 200 mg into the muscle every Thursday. For IM use only     . traMADol (ULTRAM) 50 MG tablet Take 1-2 tablets (50-100 mg total) by mouth every 6 (six) hours as needed for moderate pain. 30 tablet 0  . venlafaxine (EFFEXOR-XR) 150 MG 24 hr capsule Take 150 mg by mouth every morning.      No current facility-administered medications on file prior to visit.     Allergies  Allergen Reactions  . Codeine Itching    Blood pressure 140/76, pulse 62, temperature (!) 97.2 F (36.2 C), resp. rate 17, height 5\' 11"  (1.803 m), weight 122.5 kg (270 lb), SpO2 98 %.  Subjective: Grant [name] and presents today for evaluation status post Bentall and aortic valve replacement. He was recently discharged on Sunday 9/23 and since that point wife explains that he has acted stoned and is having occasional hallucinations. She stopped pain medicine 2 days ago and he's been acting the same. She called our office yesterday asking for advice and we discontinued his phenylephrine over-the-counter medication in addition divided his amiodarone dose in half to 200 daily. We reviewed all his medications and made sure he wasn't taking additional over-the-counter medications. She shares that he has not slept since he left the hospital which could be contributing to his mentation. He has been eating occasionally but not all the time. He did not take his pain medication this morning because he claims he couldn't swallow the pills.   Objective: Cor: RRR, soft systolic murmur Pulm: rhonchi in the lower lung fields, CTA in all other fields Abd: normal bowel sounds and no tenderness Ext: no edema Wound: s/d/i, some sternal instability with palpation Neuro: knows the month, year, and  president. Responses were delayed but accurate  Assessment & Plan  Patient was discussed with Dr. Prescott Gum. The patient's affect is flat and his speech is slower and calculated. He states that he feels so bad that he would rather discontinue all his medications and start drinking again. The patient does state that he has stopped drinking since discharge. He states that he hasn't been able to sleep more than 5 hours in the last several days. He has a violent coughing attacks that occur out of the blue and can last for up to 5  minutes. He does not always get to his heart pillow and time. He is still taking the Tessalon Perles, Delsym, and cough drops. The patient feels very discouraged at this time and would do anything to feel better. He states that his throat is sore and raw feeling. His cough attacks are productive and occasionally the sputum is blood tinged. He states that he was unable to swallow this morning, therefore he did not take his morning medications. He also states that he does not appreciate being told what to do and I reassured him that we can only make suggestions, and he can make his own decisions. He states that he does not want to go back in the hospital again and have a sitter like he did after surgery. The patient did get off  The exam table to go down for a chest x-ray and did indeed seem unsteady on his feet. He grabbed for the counter to gain his balance. He may need to be admitted to the hospitalist service to do a full neurologic workup.    Elgie Collard 07/15/2017   Patient examined, situation discussed in length with patient and his wife and chest x-ray images performed today personally reviewed  The patient has had a difficult time transitioning from hospital to home mainly from insomnia, poor appetite, and frustration about the time required to recover for major cardiac surgery for aortic root-Bentall procedure he has underlying psychiatric problems from previous toxic  encephalopathy and chronic fatigue syndrome which have magnified the usual postoperative problems with insomnia, poor appetite, weakness, and depression.  Medically the patient appears to be doing well in sinus rhythm, no evidence of CHF, chest x-ray clear, surgical incisions healing well and no focal motor neurologic deficit. His weight is at a normal level for him at 270 pounds. He is not febrile and his oxygen saturation on room air is normal.  Unfortunately the primary physician is treated the patient's chronic fatigue syndrome is no longer an active practice of medicine. We will prescribe the patient Restoril 30 mg daily at bedtime for sleep. He will also be given a prescription for Magic mouthwash for probable oral thrush and sore throat. The patient and his wife or reassured that medically he is doing well. We will continue have home health nursing follow the patient and request daily visits to make sure the patient is safe at home with his tendency for impulsive behavior, frustration with his current limitations, and depression.

## 2017-07-15 NOTE — Telephone Encounter (Signed)
Patient insurance is active and benefits verified. Patient insurance is [employer] - no co-payment, no deductible, out of pocket $6700/$1035.49 has been met, 20/% co-insurance, 36 visits and no pre-authorization. Passport/reference [id number]. I also called [employer] and spoke with Manfred Shirts -reference [id number].   Patient will be contacted and scheduled after their follow up appointment with the cardiologist on 07/27/17 and surgeon on 08/12/17, upon review by [facility] RN navigator.

## 2017-07-15 NOTE — Telephone Encounter (Signed)
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  Discharged home on 9/25.   Medications: Current Outpatient Prescriptions on File Prior to Visit  Medication Sig Dispense Refill  . albuterol (VENTOLIN HFA) 108 (90 Base) MCG/ACT inhaler INHALE 2 PUFFS INTO LUNGS EVERY 6 HOURS AS NEEDED FOR WHEEZING OR SHORTNESS OF BREATH 18 g 12  . amiodarone (PACERONE) 200 MG tablet Take 1 tablet (200 mg total) by mouth 2 (two) times daily. 60 tablet 1  . aspirin 81 MG EC tablet Take 1 tablet (81 mg total) by mouth daily.    Marland Kitchen azelastine (ASTELIN) 137 MCG/SPRAY nasal spray Place 1-2 sprays into the nose 2 (two) times daily as needed for rhinitis or allergies.     . benzonatate (TESSALON) 200 MG capsule Take 1 capsule (200 mg total) by mouth every 6 (six) hours as needed for cough. 30 capsule 0  . budesonide-formoterol (SYMBICORT) 160-4.5 MCG/ACT inhaler INHALE 2 PUFF INTO THE LUNGS 2 TIMES A DAY. 10.2 g 12  . carvedilol (COREG) 12.5 MG tablet Take 1 tablet (12.5 mg total) by mouth 2 (two) times daily with a meal. 60 tablet 1  . Cholecalciferol (VITAMIN D-3) 5000 UNITS TABS Take 5,000 Units by mouth daily with breakfast.     . COLCRYS 0.6 MG tablet Take 0.6 mg by mouth daily. With breakfast    . Cyanocobalamin (VITAMIN B12 SL) Place 1,200 mcg under the tongue daily. 1 dropper    . dextromethorphan (DELSYM) 30 MG/5ML liquid Take 30 mg by mouth at bedtime as needed for cough.     Marland Kitchen DHEA 50 MG TABS Take 50 mg by mouth daily with breakfast.     . fexofenadine (ALLEGRA) 180 MG tablet Take 180 mg by mouth daily with breakfast.     . finasteride (PROSCAR) 5 MG tablet Take 5 mg by mouth at bedtime.     . Hypromellose (ALZAIR ALLERGY NASAL SPRAY NA) Place 1 spray into both nostrils daily as needed (for allergies).     Marland Kitchen levothyroxine (SYNTHROID,  LEVOTHROID) 112 MCG tablet Take 112 mcg by mouth daily before breakfast.     . liothyronine (CYTOMEL) 25 MCG tablet Take 25 mcg by mouth every morning.     . Menthol, Topical Analgesic, (BLUE-EMU MAXIMUM STRENGTH EX) Apply 1 application topically 4 (four) times daily as needed (for arthritis pain.).    Marland Kitchen milk thistle 175 MG tablet Take 175 mg by mouth daily.    . Multiple Vitamins-Minerals (ONE-A-DAY MENS HEALTH FORMULA PO) Take 1 tablet by mouth daily with breakfast.     . omeprazole (PRILOSEC) 40 MG capsule Take 1 capsule (40 mg total) by mouth 2 (two) times daily. 180 capsule 1  . perphenazine (TRILAFON) 2 MG tablet Take 2 mg by mouth 2 (two) times daily as needed (for migraines).     . Phenylephrine-DM-GG 5-10-200 MG TABS Take 2 tablets by mouth 2 (two) times daily as needed (for congestion/cough).    . Probiotic Product (ALIGN) 4 MG CAPS Take 4 mg by mouth daily with breakfast.     . tamsulosin (FLOMAX) 0.4 MG CAPS capsule Take 0.4 mg by mouth daily after supper.    . testosterone cypionate (DEPOTESTOTERONE CYPIONATE) 100 MG/ML injection Inject 200 mg into the  muscle every Thursday. For IM use only     . traMADol (ULTRAM) 50 MG tablet Take 1-2 tablets (50-100 mg total) by mouth every 6 (six) hours as needed for moderate pain. 30 tablet 0  . venlafaxine (EFFEXOR-XR) 150 MG 24 hr capsule Take 150 mg by mouth every morning.     . warfarin (COUMADIN) 2.5 MG tablet Take 1 tablet (2.5 mg total) by mouth daily at 6 PM. Adjust dose as per coumadin clinic instructions 100 tablet 1   No current facility-administered medications on file prior to visit.     Coumadin:  INR check Yes/No  Recent INR 2.2  Problems/Concerns: The patient's wife called on 07/14/2017 stating that the patient seemed "stoned" and "out there". He had been having hallucinations, seeing Bugs, and seeing protracted images away from actual images. He feels nauseous and sick. He is coughing quite a bit. He feels he has no energy. He  has been off all pain medication both the oxycodone and tramadol for 24 hours. His pain is well controlled on just Tylenol. The wife states that his balance is off when he tries to walk. He is eating better today than in the past. He is drinking a lot of fluid. He is urinating fine and he has had bowel movements. He does use a CPAP machine at night at baseline but he has been using it more frequently during the day.  Assessment:    We discussed all of the patient's medications. We recommend avoiding phenylephrine in any medication that he might be taking over-the-counter. He did start plain Mucinex for secretions. He remains on Delsym and [employer]. He occasionally takes over-the-counter cough drops. None of his cough medication has codeine. He is not on any benzodiazepines. We decreased his amiodarone dose from 200 twice a day to 200 daily since sometime this medication can have similar side effects. The wife states that he did have this delirium while in the hospital but she is worried that it continues a few days after discharge. We discussed the possibility of it being due to anesthesia and previous narcotic pain medicines. I discussed the patient with Dr. Prescott Gum. He requested a full report from home health who is supposed to make a home visit on 9/26 (today). I made our office aware that either the wife or home health would be calling with a full report. I specifically was interested in his oxygen saturation due to his increased need for his CPAP device and his current state. He takes several medications at home, however he has been on these long-term and I doubt that a new side effect would form. His INR was noted to be 2.2, and his next INR check is on October 1st. I will look forward to a phone call today from home health in regards to the patient's state and vitals.   Follow up Appointment:    [facility] Follow [facility].   Why:  HHRN/PT/OT arranged- they will call you to set  up home visits Contact information: [place] [address] [phone]        Ivin Poot, MD Follow up on 08/12/2017.   Specialty:  Cardiothoracic Surgery Why:  Appointment is 11:00, please get CXR at 10:30 at [facility] located on first floor of our office building Contact information: [address] [place] [address] [phone]    Drewey Begue, PA-C

## 2017-07-15 NOTE — Progress Notes (Unsigned)
cxr 

## 2017-07-16 ENCOUNTER — Ambulatory Visit: Payer: Medicare Other | Admitting: Internal Medicine

## 2017-07-16 ENCOUNTER — Telehealth: Payer: Self-pay | Admitting: Internal Medicine

## 2017-07-16 ENCOUNTER — Ambulatory Visit (INDEPENDENT_AMBULATORY_CARE_PROVIDER_SITE_OTHER): Payer: Medicare Other | Admitting: Internal Medicine

## 2017-07-16 ENCOUNTER — Encounter: Payer: Self-pay | Admitting: Internal Medicine

## 2017-07-16 DIAGNOSIS — J42 Unspecified chronic bronchitis: Secondary | ICD-10-CM

## 2017-07-16 DIAGNOSIS — K219 Gastro-esophageal reflux disease without esophagitis: Secondary | ICD-10-CM | POA: Diagnosis not present

## 2017-07-16 MED ORDER — MAGIC MOUTHWASH W/LIDOCAINE: 10.0000 mL | Freq: Four times a day (QID) | ORAL | 1 refills | Status: DC | PRN

## 2017-07-16 NOTE — Progress Notes (Signed)
History of Present Illness: . male former smoker with asthma/chronic bronchitis and allergic rhinitis, OSA PAF/ anticoagulation, toxic encephalopathy, light sensitivity, chronic fatigue/fibromyalgia NPSG 03/24/03- AHI 88/ hr, desaturation to 75%, body weight 244 lbs PFT 06/01/17-WNL-FVC 4.46/96%, FEV1 3.71/106%, ratio 0.83, FEF 25-75% 4.64/167%, no response to dilator, TLC 107%, DLCO 101%  ------------------------------------------------------------------  01/19/2017-66 year old male former smoker followed forasthma/chronic bronchitis and allergic rhinitis,  OSA complicated by PAF/ anticoagulation, aortic aneurysm, toxic encephalopathy, light sensitivity, chronic fatigue/fibromyalgia CPAP 12/Advanced FOLLOWS FOR: DME:[facility]. Pt states he wears CPAP nightly and pressure working well for patient. Pt needs new headgear with nasal pillows. No DL and not in AV.  Current CPAP machine is quite old. We discussed updating documentation for home sleep test before changing DME companies and getting a new machine. Increasing rhinorrhea with spring pollen season. There would like to try Allegra instead of Claritin. Always "some cough" but he seems to feel well controlled. Continue Symbicort daily, rescue inhaler only occasionally.  He can feel palpitation when in atrial fibrillation. CXR 10/31/2016- No active cardiopulmonary disease.  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 Severe cough for the past few weeks. Not able to sleep at night. Had open heart surgery on 9/13. Sore throat.  Symbicort 160 With heart surgery he was intubated and he also had TEE. Using Tylenol for pain which he says does okay. He was given hydrocodone and  tramadol but they caused mental disturbance "crazy train hallucinations". Temazepam made him "crazy". Taking omeprazole twice daily.. Using benzonatate twice daily but wants something more to suppress cough, which disturb sleep, interferes with CPAP, and aggravates his sore throat. Sleeping in a lift chair and using CPAP every night. PFT 06/01/17-WNL-FVC 4.46/96%, FEV1 3.71/106%, ratio 0.83, FEF 25-75% 4.64/167%, no response to dilator, TLC 107%, DLCO 101%  CXR 07/12/17 IMPRESSION: 1. Persistent small right-sided pneumothorax, a little smaller in the interval. Persistent air in the subcutaneous tissues of the right chest wall. No other changes.  ROS-see HPI     + = pos Constitutional:   No-   weight loss, night sweats, fevers, chills, fatigue, lassitude. HEENT:   No-  headaches, difficulty swallowing, tooth/dental problems, + sore throat,       No-  sneezing, itching, ear ache,                    No-nasal congestion, + post nasal drip,  CV:  No-   chest pain, orthopnea, PND, swelling in lower extremities, anasarca,  dizziness, palpitations Resp: No- coughing up of blood.              No-   change in color of mucus.  No- wheezing.   Skin: Clear GI:  No-   heartburn, indigestion, abdominal pain, nausea, vomiting,  GU:  MS:  No-   joint pain or swelling.   Neuro-     nothing unusual Psych:  No- change in mood or affect. + depression or anxiety.  No memory loss.  OBJ- Physical Exam General- Alert, Oriented, Affect-appropriate, Distress- none acute. Sunglasses, + overweight Skin- +eczematoid patches on lower calves, nonspecific, excoriated Lymphadenopathy- none Head- atraumatic. Stares aimlessly around room like a blind person.             Eyes- Gross vision intact, PERRLA, conjunctivae and secretions clear  Ears- Hearing, canals-normal            Nose- Clear, no-Septal dev, mucus, polyps, erosion, perforation             Throat- Mallampati III-IV , mucosa +red , drainage- none,  tonsils- atrophic Neck- flexible , trachea midline, no stridor , thyroid nl, carotid no bruit Chest - symmetrical excursion , unlabored           Heart/CV- RRR , no murmur , no gallop  , no rub, nl s1 s2                           - JVD- none , edema- none, stasis changes- none, varices- none           Lung- + clear/ unlabored, wheeze- none, cough + , dullness-none, rub- none           Chest wall-  Abd-  Br/ Gen/ Rectal- Not done, not indicated Extrem- cyanosis- none, clubbing, none, atrophy- none, strength- nl Neuro- grossly intact to observation

## 2017-07-16 NOTE — Telephone Encounter (Signed)
Called and spoke with [facility] to clarification. [name] states pharmacist is currently at lunch and will return our call once back from lunch.  Will await call back.

## 2017-07-16 NOTE — Telephone Encounter (Signed)
Attempted to call the pharmacy but was placed on hold and could not reach anyone.  Will need to call back

## 2017-07-16 NOTE — Patient Instructions (Addendum)
Try soft foods for a few days while raw throat heals  Try "Throat Coat Tea" , throat lozenges and sore throat remedies  Ok to use the benzonatate perles three times daily if needed  Ok to continue throat lozenges, Delsym and reflux precautions Please call as needed

## 2017-07-16 NOTE — Telephone Encounter (Signed)
Patient wife Tye [place] calling regarding mouthwash rx - she would like for [place] to call back to the pharmacy [facility] on [address]. Patient wife can be reached at [phone]. -pr

## 2017-07-17 ENCOUNTER — Telehealth: Payer: Self-pay | Admitting: Internal Medicine

## 2017-07-17 MED ORDER — AMOXICILLIN-POT CLAVULANATE 875-125 MG PO TABS: 1.0000 | ORAL_TABLET | Freq: Two times a day (BID) | ORAL | 0 refills | Status: DC

## 2017-07-17 MED ORDER — FIRST-DUKES MOUTHWASH MT SUSP: OROMUCOSAL | 1 refills | Status: DC

## 2017-07-17 NOTE — Telephone Encounter (Signed)
Ok to use their usual [employer] magic mouth wash formula and leave out the lidocaine.

## 2017-07-17 NOTE — Telephone Encounter (Signed)
Ok to d/c Zpak and change order to augmentin 875 mg, # 14, 1 twice daily

## 2017-07-17 NOTE — Telephone Encounter (Signed)
Pt's spouse is aware of CY's recommendations and voiced her understanding.  Rx for Augmentin has been sent to preferred pharmacy. Nothing further needed.

## 2017-07-17 NOTE — Telephone Encounter (Signed)
Was placed on hold for 10 mins with the pharmacy ----there is a specific pharmacists that need to be spoken to due to the compound of this medication.  Will have to call back. I called Grant [name] to make her aware.  She stated that she just spoke with the pharmacy and they will be calling us.    Grant [name], from [facility] called back---  She stated that they have a dukes MMW mixture but this does not include the lidocaine.  She will need the % of lidocaine that CY wants mixed with this.  CY please advise and we can send in a new rx with this in the notes for the pharmacy.  thanks

## 2017-07-17 NOTE — Telephone Encounter (Signed)
Spoke with pt's spouse who states pt feels that he may be coming down with a sinus infection. Juliann Pulse was unable to tell me pt's symptoms. Juliann Pulse states pt has a Rx for Zpak on hand. Juliann Pulse is currently at the pharmacy to fill Rx and has spoken with pharmacists, who states Augmentin would be better since pt is currently on Warfarin.   CY please advise. Thanks.   Current Outpatient Prescriptions on File Prior to Visit  Medication Sig Dispense Refill  . acetaminophen (TYLENOL) 500 MG tablet Take 500 mg by mouth every 6 (six) hours as needed for moderate pain.    Marland Kitchen albuterol (VENTOLIN HFA) 108 (90 Base) MCG/ACT inhaler INHALE 2 PUFFS INTO LUNGS EVERY 6 HOURS AS NEEDED FOR WHEEZING OR SHORTNESS OF BREATH 18 g 12  . aspirin 81 MG EC tablet Take 1 tablet (81 mg total) by mouth daily.    Marland Kitchen azelastine (ASTELIN) 137 MCG/SPRAY nasal spray Place 1-2 sprays into the nose 2 (two) times daily as needed for rhinitis or allergies.     . benzonatate (TESSALON) 200 MG capsule Take 1 capsule (200 mg total) by mouth every 6 (six) hours as needed for cough. 30 capsule 0  . budesonide-formoterol (SYMBICORT) 160-4.5 MCG/ACT inhaler INHALE 2 PUFF INTO THE LUNGS 2 TIMES A DAY. 10.2 g 12  . carvedilol (COREG) 12.5 MG tablet Take 1 tablet (12.5 mg total) by mouth 2 (two) times daily with a meal. 60 tablet 1  . Cholecalciferol (VITAMIN D-3) 5000 UNITS TABS Take 5,000 Units by mouth daily with breakfast.     . COLCRYS 0.6 MG tablet Take 0.6 mg by mouth daily. With breakfast    . Cyanocobalamin (VITAMIN B12 SL) Place 1,200 mcg under the tongue daily. 1 dropper    . dextromethorphan (DELSYM) 30 MG/5ML liquid Take 30 mg by mouth at bedtime as needed for cough.     Marland Kitchen DHEA 50 MG TABS Take 50 mg by mouth daily with breakfast.     . Diphenhyd-Hydrocort-Nystatin (FIRST-DUKES MOUTHWASH) SUSP 10 ml's by mouth 4 times daily as needed for mouth pain 240 mL 1  . fexofenadine (ALLEGRA) 180 MG tablet Take 180 mg by mouth daily with breakfast.      . finasteride (PROSCAR) 5 MG tablet Take 5 mg by mouth at bedtime.     . Hypromellose (ALZAIR ALLERGY NASAL SPRAY NA) Place 1 spray into both nostrils daily as needed (for allergies).     Marland Kitchen levothyroxine (SYNTHROID, LEVOTHROID) 112 MCG tablet Take 112 mcg by mouth daily before breakfast.     . liothyronine (CYTOMEL) 25 MCG tablet Take 25 mcg by mouth every morning.     . magic mouthwash w/lidocaine SOLN Take 10 mLs by mouth 4 (four) times daily as needed for mouth pain. 240 mL 1  . Menthol, Topical Analgesic, (BLUE-EMU MAXIMUM STRENGTH EX) Apply 1 application topically 4 (four) times daily as needed (for arthritis pain.).    Marland Kitchen milk thistle 175 MG tablet Take 175 mg by mouth daily.    . Multiple Vitamins-Minerals (ONE-A-DAY MENS HEALTH FORMULA PO) Take 1 tablet by mouth daily with breakfast.     . omeprazole (PRILOSEC) 40 MG capsule Take 1 capsule (40 mg total) by mouth 2 (two) times daily. 180 capsule 1  . perphenazine (TRILAFON) 2 MG tablet Take 2 mg by mouth 2 (two) times daily as needed (for migraines).     . Probiotic Product (ALIGN) 4 MG CAPS Take 4 mg by mouth daily with breakfast.     .  tamsulosin (FLOMAX) 0.4 MG CAPS capsule Take 0.4 mg by mouth daily after supper.    . temazepam (RESTORIL) 30 MG capsule Take 30 mg by mouth at bedtime as needed for sleep.    Marland Kitchen testosterone cypionate (DEPOTESTOTERONE CYPIONATE) 100 MG/ML injection Inject 200 mg into the muscle every Thursday. For IM use only     . venlafaxine (EFFEXOR-XR) 150 MG 24 hr capsule Take 150 mg by mouth every morning.     . warfarin (COUMADIN) 2.5 MG tablet Take 2.5 mg by mouth daily. 2.5mg  Tues, Thursday, Sat & Sunday.  1.5mg  Monday, Wed, Friday  (As of 07/15/17)     No current facility-administered medications on file prior to visit.     Allergies  Allergen Reactions  . Codeine Itching

## 2017-07-17 NOTE — Telephone Encounter (Signed)
Left a detailed message due to a recoding stating they have high volume of calls at this time. All instructions from CY was left on message.

## 2017-07-17 NOTE — Telephone Encounter (Signed)
Attempted to call [facility]. They are not open yet. Will route to triage for follow up.

## 2017-07-17 NOTE — Telephone Encounter (Signed)
I have sent the new rx to the pharmacy and [name] advised the pts wife that the new rx was sent to the pharmacy.

## 2017-07-18 NOTE — Assessment & Plan Note (Signed)
It sounds as if prolonged intubation and additional procedure of TEE have irritated his hypopharynx. He doesn't tolerate many cough suppressants. If we can help him get through the next week or so he should gradually get better Plan-I suggested they use the benzonatate 3 times daily, throat lozenges, sips of liquids, try simple soothing coating products like Throat Coat Tea.Marland Kitchen

## 2017-07-18 NOTE — Assessment & Plan Note (Signed)
Cautioned to maintain full reflux precautions and continue twice daily acid blocker for now. Even mild occasional reflux will aggravate his cough.

## 2017-07-19 DIAGNOSIS — G4733 Obstructive sleep apnea (adult) (pediatric): Secondary | ICD-10-CM | POA: Diagnosis not present

## 2017-07-20 ENCOUNTER — Telehealth: Payer: Self-pay | Admitting: Internal Medicine

## 2017-07-20 ENCOUNTER — Ambulatory Visit (INDEPENDENT_AMBULATORY_CARE_PROVIDER_SITE_OTHER): Payer: Medicare Other | Admitting: Cardiovascular Disease

## 2017-07-20 ENCOUNTER — Encounter: Payer: Self-pay | Admitting: *Deleted

## 2017-07-20 DIAGNOSIS — Z954 Presence of other heart-valve replacement: Secondary | ICD-10-CM

## 2017-07-20 DIAGNOSIS — Z5181 Encounter for therapeutic drug level monitoring: Secondary | ICD-10-CM

## 2017-07-20 DIAGNOSIS — I480 Paroxysmal atrial fibrillation: Secondary | ICD-10-CM

## 2017-07-20 LAB — POCT INR: INR: 1.3

## 2017-07-20 NOTE — Telephone Encounter (Signed)
Spoke with pt's wife, c/o increased sob at rest and with any exertion, prod cough with green mucus Xfew days.  [name] also notes intermittent "spasms"- wife describes this as leg, arm, and abdominal jerking.   Denies fever, sinus congestion, chest pains.   Home Health Nurse came to home today and sats were normal- O2 at 98% on RA, BP 110/80, resting heart rate 80 bpm as of this morning.  Pt has been wearing cpap and laying in recliner all day.  I asked pt's wife why he was wearing cpap, states "it makes him breathe easier".  Pt has used rescue inhaler and worn cpap during daytime to help with s/s.  Requesting further recs.    [facility] uses [facility] on [address].    CY please advise.  Thanks.

## 2017-07-20 NOTE — Telephone Encounter (Signed)
Spoke with pt's wife. She is aware of CY's recommendation. Pt is already on Augmentin, so he will continue this. Nothing further was needed.

## 2017-07-20 NOTE — Telephone Encounter (Signed)
Ok to use the CPAP in the daytime if he wants.  Basic lung function is good.   We can treat for possible bacterial tracheobronchitis  With augmentin 875, # 14, 1 twice daily

## 2017-07-21 ENCOUNTER — Encounter (HOSPITAL_COMMUNITY): Payer: Self-pay | Admitting: *Deleted

## 2017-07-27 ENCOUNTER — Ambulatory Visit (INDEPENDENT_AMBULATORY_CARE_PROVIDER_SITE_OTHER): Payer: Medicare Other

## 2017-07-27 ENCOUNTER — Encounter: Payer: Self-pay | Admitting: Nurse Practitioner

## 2017-07-27 ENCOUNTER — Ambulatory Visit (INDEPENDENT_AMBULATORY_CARE_PROVIDER_SITE_OTHER): Payer: Medicare Other | Admitting: Nurse Practitioner

## 2017-07-27 VITALS — BP 122/82 | HR 97 | Ht 70.0 in | Wt 255.2 lb

## 2017-07-27 DIAGNOSIS — Z5181 Encounter for therapeutic drug level monitoring: Secondary | ICD-10-CM | POA: Diagnosis not present

## 2017-07-27 DIAGNOSIS — I480 Paroxysmal atrial fibrillation: Secondary | ICD-10-CM

## 2017-07-27 DIAGNOSIS — Z954 Presence of other heart-valve replacement: Secondary | ICD-10-CM

## 2017-07-27 DIAGNOSIS — Z79899 Other long term (current) drug therapy: Secondary | ICD-10-CM | POA: Diagnosis not present

## 2017-07-27 LAB — POCT INR: INR: 1.1

## 2017-07-27 NOTE — Progress Notes (Signed)
CARDIOLOGY OFFICE NOTE  Date:  07/27/2017    [name] Date of Birth: [birth year] Medical Record #[id number]  PCP:  Shirline Frees, MD  Cardiologist:  Gillian Shields  Chief Complaint  Patient presents with  . Follow-up    Post hospital visit - seen for Dr. [name]    History of Present Illness: Grant [name] is a 66 y.o. male who presents today for a post hospital visit. Seen for Dr. [name].   He is an morbidly obese white male with known history of moderate aortic insufficiency and bicuspid aortic valve, AF, COPD, anxiety,& chronic fatigue syndrome.  Mother died with a dissection.   I saw him back in July - got his scan updated and referred on to [facility] for AVR and replacement of the ascending aorta. Noted daily alcohol use.   On 07/02/2017 he was taken to the operating room and underwent Bentall procedure under hypothermic circulatory arrest with [name].   This was done with a 25 mm Pericardial valve with a 28 mm Valsalva graft. He tolerated the procedure without difficulty.  He had post operative delirium.  He was displaying agitated behavior and unrealistic requests.  He did not seem to understand and was unwilling to follow caregivers recommendations.  Ultimately he continued to have issues with non-compliance and getting out of bed, resulting in ordering of safety sitter.  He developed AF and was treated with IV Amiodarone and coumadin.  He converted to NSR.  He was placed on coumadin.  Did have persistent small right sided pneumothorax.   Seen by pulmonary about 10 days ago with cough and sore throat.  The day before was seen at [facility] - wife concerned he was "stoned" - noted to be a very difficult visit. He was in NSR, no evidence of CHF, CXR was clear and incisions healing and no focal motor neurologic deficit noted. He was not febrile. Sats on RA normal. Unfortunately, the MD that previously treated his chronic fatigue and was currently not in practice  (Dr.  Deirdre Pippins).          Comes in today. Here with his wife. He is in a wheelchair.  Still with his sunglasses on. He is making progress - it is slow - but he is doing a bit more. Walking about 800 feet - sometimes twice a day. Staying up the daytime more. Cough as improved considerably. Weight is way down. He is NOT drinking. She notes he still has spells where he "stones out". Seems like he is getting back to his baseline. Just started back on his testosterone shots - this was part of his chronic fatigue regimen. No fever or chills. Sternum looks good. Not really using his IS or pillow as he should.   Past Medical History:  Diagnosis Date  . Allergic rhinitis   . Anxiety   . Arthritis   . Asthma   . Bladder tumor   . Chronic fatigue   . Chronic fatigue   . Depression    06/30/17 Pt denies being depressed, reports Effexor is taken for Chronic Fatigue   . Dyspnea   . Enlarged prostate   . Fibromyalgia   . GERD (gastroesophageal reflux disease)   . Headache   . History of chronic bronchitis   . History of migraine   . History of toxic encephalopathy   . Hypothyroidism   . OSA on CPAP    CPAP 14  . PAF (paroxysmal atrial fibrillation) (HCC) CARDIOLOGIST --  DR [name]   DX OCT 2013  . 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 Unspecified essential hypertension   . Urethral tumor    PROSTATIC    Past Surgical History:  Procedure Laterality Date  . BENTALL PROCEDURE N/A 07/02/2017   Procedure: BENTALL PROCEDURE;  Surgeon: Ivin Poot, MD;  Location: [place];  Service: Open Heart Surgery;  Laterality: N/A;  WITH CIRC ARREST  . COLONOSCOPY    . CYSTOSCOPY W/ RETROGRADES Bilateral 06/15/2013   Procedure: CYSTOSCOPY WITH BILATERAL RETROGRADE PYELOGRAM  BLADDER BIOPSY, PROSTATIC URETHRAL BIOPSY, ;  Surgeon: Molli Hazard, MD;  Location: [facility];  Service: Urology;  Laterality: Bilateral;  . LAPAROSCOPIC CHOLECYSTECTOMY  01-14-2001  . RIGHT/LEFT HEART CATH AND CORONARY ANGIOGRAPHY  N/A 06/12/2017   Procedure: RIGHT/LEFT HEART CATH AND CORONARY ANGIOGRAPHY;  Surgeon: Larey Dresser, MD;  Location: [facility];  Service: Cardiovascular;  Laterality: N/A;  . TEE WITHOUT CARDIOVERSION N/A 07/02/2017   Procedure: TRANSESOPHAGEAL ECHOCARDIOGRAM (TEE);  Surgeon: Prescott Gum, Collier Salina, MD;  Location: [place];  Service: Open Heart Surgery;  Laterality: N/A;  . UMBILICAL HERNIA REPAIR  01-14-2008     Medications: Current Meds  Medication Sig  . acetaminophen (TYLENOL) 500 MG tablet Take 500 mg by mouth every 6 (six) hours as needed for moderate pain.  Marland Kitchen albuterol (VENTOLIN HFA) 108 (90 Base) MCG/ACT inhaler INHALE 2 PUFFS INTO LUNGS EVERY 6 HOURS AS NEEDED FOR WHEEZING OR SHORTNESS OF BREATH  . aspirin 81 MG EC tablet Take 1 tablet (81 mg total) by mouth daily.  Marland Kitchen azelastine (ASTELIN) 137 MCG/SPRAY nasal spray Place 1-2 sprays into the nose 2 (two) times daily as needed for rhinitis or allergies.   . benzonatate (TESSALON) 200 MG capsule Take 1 capsule (200 mg total) by mouth every 6 (six) hours as needed for cough.  . budesonide-formoterol (SYMBICORT) 160-4.5 MCG/ACT inhaler INHALE 2 PUFF INTO THE LUNGS 2 TIMES A DAY.  . carvedilol (COREG) 12.5 MG tablet Take 1 tablet (12.5 mg total) by mouth 2 (two) times daily with a meal.  . Cholecalciferol (VITAMIN D-3) 5000 UNITS TABS Take 5,000 Units by mouth daily with breakfast.   . COLCRYS 0.6 MG tablet Take 0.6 mg by mouth daily. With breakfast  . Cyanocobalamin (VITAMIN B12 SL) Place 1,200 mcg under the tongue daily. 1 dropper  . dextromethorphan (DELSYM) 30 MG/5ML liquid Take 30 mg by mouth at bedtime as needed for cough.   Marland Kitchen DHEA 50 MG TABS Take 50 mg by mouth daily with breakfast.   . Diphenhyd-Hydrocort-Nystatin (FIRST-DUKES MOUTHWASH) SUSP 10 ml's by mouth 4 times daily as needed for mouth pain  . fexofenadine (ALLEGRA) 180 MG tablet Take 180 mg by mouth daily with breakfast.   . finasteride (PROSCAR) 5 MG tablet Take 5 mg by  mouth at bedtime.   . Hypromellose (ALZAIR ALLERGY NASAL SPRAY NA) Place 1 spray into both nostrils daily as needed (for allergies).   Marland Kitchen levothyroxine (SYNTHROID, LEVOTHROID) 112 MCG tablet Take 112 mcg by mouth daily before breakfast.   . liothyronine (CYTOMEL) 25 MCG tablet Take 25 mcg by mouth every morning.   . magic mouthwash w/lidocaine SOLN Take 10 mLs by mouth 4 (four) times daily as needed for mouth pain.  . Menthol, Topical Analgesic, (BLUE-EMU MAXIMUM STRENGTH EX) Apply 1 application topically 4 (four) times daily as needed (for arthritis pain.).  Marland Kitchen milk thistle 175 MG tablet Take 175 mg by mouth daily.  . Multiple Vitamins-Minerals (ONE-A-DAY MENS  HEALTH FORMULA PO) Take 1 tablet by mouth daily with breakfast.   . omeprazole (PRILOSEC) 40 MG capsule Take 1 capsule (40 mg total) by mouth 2 (two) times daily.  Marland Kitchen perphenazine (TRILAFON) 2 MG tablet Take 2 mg by mouth 2 (two) times daily as needed (for migraines).   . Probiotic Product (ALIGN) 4 MG CAPS Take 4 mg by mouth daily with breakfast.   . tamsulosin (FLOMAX) 0.4 MG CAPS capsule Take 0.4 mg by mouth daily after supper.  . testosterone cypionate (DEPOTESTOTERONE CYPIONATE) 100 MG/ML injection Inject 200 mg into the muscle every Thursday. For IM use only   . venlafaxine (EFFEXOR-XR) 150 MG 24 hr capsule Take 150 mg by mouth every morning.   . warfarin (COUMADIN) 2.5 MG tablet Take 2.5 mg by mouth daily. 2.5mg  Tues, Thursday, Sat & Sunday.  1.5mg  Monday, Wed, Friday  (As of 07/15/17)     Allergies: Allergies  Allergen Reactions  . Codeine Itching  . Tramadol     dellusion     Social History: The patient  reports that he quit smoking about 37 years ago. His smoking use included Cigarettes. He has a 13.50 pack-year smoking history. He has never used smokeless tobacco. He reports that he drinks about 12.6 oz of alcohol per week . He reports that he does not use drugs.   Family History: The patient's family history includes  Aortic aneurysm (age of onset: 47) in his mother; Heart disease in his unknown relative; Other in his father.   Review of Systems: Please see the history of present illness.   Otherwise, the review of systems is positive for none.   All other systems are reviewed and negative.   Physical Exam: VS:  BP 122/82 (BP Location: Left Arm, Patient Position: Sitting, Cuff Size: Normal)   Pulse 97   Ht 5\' 10"  (1.778 m)   Wt 255 lb 3.2 oz (115.8 kg)   BMI 36.62 kg/m  .  BMI Body mass index is 36.62 kg/m.  Wt Readings from Last 3 Encounters:  07/27/17 255 lb 3.2 oz (115.8 kg)  07/15/17 270 lb (122.5 kg)  07/12/17 275 lb 4.8 oz (124.9 kg)    General: Quite flat but I did get him to smile a few times today. He is alert and in no acute distress. He will have brief spells where he seems to "space out" - but conscious. In a wheelchair.   HEENT: Normal.  Neck: Supple, no JVD, carotid bruits, or masses noted.  Cardiac: Regular rate and rhythm. No murmurs, rubs, or gallops. His sternum looks good. Right upper chest incision ok.  No edema.  Respiratory:  Lungs are pretty clear to auscultation bilaterally with normal work of breathing.  GI: Soft and nontender.  MS: No deformity or atrophy. Gait not tested. In a wheelchair today.  Skin: Warm and dry. Color is normal.  Neuro:  Strength and sensation are intact and no gross focal deficits noted.  Psych: Alert, appropriate and with normal affect.   LABORATORY DATA:  EKG:  EKG is ordered today. This demonstrates NSR.  Lab Results  Component Value Date   WBC 11.7 (H) 07/10/2017   HGB 9.6 (L) 07/10/2017   HCT 29.7 (L) 07/10/2017   PLT 179 07/10/2017   GLUCOSE 103 (H) 07/10/2017   CHOL 184 05/05/2017   TRIG 225 (H) 05/05/2017   HDL 45 05/05/2017   LDLCALC 94 05/05/2017   ALT 24 06/30/2017   AST 26 06/30/2017   NA 134 (  L) 07/10/2017   K 4.0 07/10/2017   CL 102 07/10/2017   CREATININE 1.00 07/10/2017   BUN 11 07/10/2017   CO2 25 07/10/2017    TSH 1.670 05/05/2017   INR 1.1 07/27/2017   HGBA1C 5.5 06/30/2017     BNP (last 3 results) No results for input(s): BNP in the last 8760 hours.  ProBNP (last 3 results) No results for input(s): PROBNP in the last 8760 hours.   Other Studies Reviewed Today:  Treatments: surgery:   1. Biologic Bentall procedure, aortic root replacement with aortic valve conduit using a 28 mm Valsalva graft and a 25 mm Edwards pericardial Magna Ease valve with reimplantation of the coronary arteries and replacement of the ascending aorta to the arch (AVR with an Edwards model 3300TFX, serial [id number] aortic valve). 2. Hypothermic circulatory arrest with antegrade cerebral perfusion utilizing right axillary artery cannulation for bypass. 3. Neuroprotective pharmacologic treatment by the Anesthesia team with topical cooling of the brain and neuroprotective monitoring using cerebral pulse oximetry    TEE Result status: Final result 07/02/2017   Left ventricle: Normal wall thickness, left ventricular diastolic function and left atrial pressure. Cavity is mildly dilated. LV systolic function is normal with an EF of 55-60%. No thrombus present. No mass present.  Aortic valve: The valve is possible bicuspid. Mild valve calcification present. No stenosis. Mild to moderate regurgitation.  Aorta: The aortic root is dilated at the sinuses of Valsalva. The ascending aorta is dilated.  Mitral valve: Trace regurgitation. Systolic anterior motion of the chordal apparatus is present.  Right ventricle: Normal cavity size, wall thickness and ejection fraction.  Tricuspid valve: Trace regurgitation. The tricuspid valve regurgitation jet is central.  Aorta: Graft present in the ascending aorta.   RIGHT/LEFT HEART CATH AND CORONARY ANGIOGRAPHY 05/2017  Conclusion   1. Normal filling pressures, no pulmonary hypertension.  2. Preserved cardiac output.  3. 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  No significant coronary disease.       Assessment/Plan: 1. S/P Bentall - AVR - tissue valve - with replacement of the ascending aorta - very slow progress but I am actually surprised at how well he is doing given his post op course. Encouraged him to keep building on current level of activity. Not ready for rehab yet. Lab today. Echo in one month.   2. Post op AF - he has had PAF prior to his surgery - he was previously on Xarelto - now on coumadin. Currently in NSR. No longer on Amiodarone. He is on Coreg.   3. Chronic coumadin therapy - for persistent post op AF - no problems noted. Lab today.   4. Daily alcohol abuse - not drinking - hopefully he will be able to abstain.   5. Chronic fatigue - this will be his biggest issue going forward.   Current medicines are reviewed with the patient today.  The patient does not have concerns regarding medicines other than what has been noted above.  The following changes have been made:  See above.  Labs/ tests ordered today include:    Orders Placed This Encounter  Procedures  . Basic metabolic panel  . CBC  . EKG 12-Lead  . ECHOCARDIOGRAM COMPLETE     Disposition:   FU with Dr. [name] in one month with echo.   Patient is agreeable to this plan and will call if any problems develop in the interim.   SignedTruitt Merle, NP  07/27/2017 11:38  AM  [facility] [address] [place], [place]  [address] Phone: [phone] Fax: [phone]

## 2017-07-27 NOTE — Patient Instructions (Addendum)
We will be checking the following labs today - BMET and CBC   Medication Instructions:    Continue with your current medicines.     Testing/Procedures To Be Arranged:  Echocardiogram in one month  Follow-Up:   See Dr. [name] in one month - would like to do on same day of echo.     Other Special Instructions:   Keep moving  Keep using your incentive spirometry and your pillow     If you need a refill on your cardiac medications before your next appointment, please call your pharmacy.   Call the [place] office at [phone] if you have any questions, problems or concerns.

## 2017-07-28 ENCOUNTER — Observation Stay (HOSPITAL_COMMUNITY)
Admission: EM | Admit: 2017-07-28 | Discharge: 2017-07-29 | Disposition: A | Discharge status: Home / Self Care | Payer: Medicare Other | Attending: Cardiovascular Disease | Admitting: Cardiovascular Disease

## 2017-07-28 ENCOUNTER — Telehealth: Payer: Self-pay | Admitting: Nurse Practitioner

## 2017-07-28 ENCOUNTER — Emergency Department (HOSPITAL_COMMUNITY): Payer: Medicare Other

## 2017-07-28 ENCOUNTER — Encounter (HOSPITAL_COMMUNITY): Payer: Self-pay | Admitting: *Deleted

## 2017-07-28 DIAGNOSIS — R0602 Shortness of breath: Secondary | ICD-10-CM | POA: Diagnosis not present

## 2017-07-28 DIAGNOSIS — Z87891 Personal history of nicotine dependence: Secondary | ICD-10-CM | POA: Diagnosis not present

## 2017-07-28 DIAGNOSIS — Z7901 Long term (current) use of anticoagulants: Secondary | ICD-10-CM | POA: Diagnosis not present

## 2017-07-28 DIAGNOSIS — R000 Tachycardia, unspecified: Secondary | ICD-10-CM | POA: Diagnosis not present

## 2017-07-28 DIAGNOSIS — I4891 Unspecified atrial fibrillation: Secondary | ICD-10-CM | POA: Diagnosis not present

## 2017-07-28 DIAGNOSIS — Z7982 Long term (current) use of aspirin: Secondary | ICD-10-CM | POA: Diagnosis not present

## 2017-07-28 DIAGNOSIS — J449 Chronic obstructive pulmonary disease, unspecified: Secondary | ICD-10-CM | POA: Insufficient documentation

## 2017-07-28 DIAGNOSIS — E039 Hypothyroidism, unspecified: Secondary | ICD-10-CM | POA: Insufficient documentation

## 2017-07-28 DIAGNOSIS — Z952 Presence of prosthetic heart valve: Secondary | ICD-10-CM | POA: Diagnosis not present

## 2017-07-28 DIAGNOSIS — R55 Syncope and collapse: Secondary | ICD-10-CM | POA: Diagnosis not present

## 2017-07-28 DIAGNOSIS — Z79899 Other long term (current) drug therapy: Secondary | ICD-10-CM | POA: Diagnosis not present

## 2017-07-28 DIAGNOSIS — I10 Essential (primary) hypertension: Secondary | ICD-10-CM | POA: Diagnosis not present

## 2017-07-28 DIAGNOSIS — G4733 Obstructive sleep apnea (adult) (pediatric): Secondary | ICD-10-CM | POA: Insufficient documentation

## 2017-07-28 DIAGNOSIS — R404 Transient alteration of awareness: Secondary | ICD-10-CM | POA: Diagnosis not present

## 2017-07-28 LAB — CBC
HCT: 38.4 % — ABNORMAL LOW (ref 39.0–52.0)
Hematocrit: 36.3 % — ABNORMAL LOW (ref 37.5–51.0)
Hemoglobin: 12.1 g/dL — ABNORMAL LOW (ref 13.0–17.7)
Hemoglobin: 12.1 g/dL — ABNORMAL LOW (ref 13.0–17.0)
MCH: 28.5 pg (ref 26.6–33.0)
MCH: 28.1 pg (ref 26.0–34.0)
MCHC: 33.3 g/dL (ref 31.5–35.7)
MCHC: 31.5 g/dL (ref 30.0–36.0)
MCV: 86 fL (ref 79–97)
MCV: 89.1 fL (ref 78.0–100.0)
Platelets: 287 10*3/uL (ref 150–379)
Platelets: 235 10*3/uL (ref 150–400)
RBC: 4.24 x10E6/uL (ref 4.14–5.80)
RBC: 4.31 MIL/uL (ref 4.22–5.81)
RDW: 14.4 % (ref 12.3–15.4)
RDW: 13.4 % (ref 11.5–15.5)
WBC: 9 10*3/uL (ref 3.4–10.8)
WBC: 8.9 10*3/uL (ref 4.0–10.5)

## 2017-07-28 LAB — BASIC METABOLIC PANEL
Anion gap: 10 (ref 5–15)
BUN/Creatinine Ratio: 12 (ref 10–24)
BUN: 11 mg/dL (ref 8–27)
BUN: 12 mg/dL (ref 6–20)
CO2: 22 mmol/L (ref 20–29)
CO2: 24 mmol/L (ref 22–32)
Calcium: 9.4 mg/dL (ref 8.6–10.2)
Calcium: 9.3 mg/dL (ref 8.9–10.3)
Chloride: 102 mmol/L (ref 96–106)
Chloride: 104 mmol/L (ref 101–111)
Creatinine, Ser: 0.94 mg/dL (ref 0.76–1.27)
Creatinine, Ser: 1.05 mg/dL (ref 0.61–1.24)
GFR calc Af Amer: 99 mL/min/{1.73_m2} (ref >59)
GFR calc Af Amer: >60 mL/min (ref >60)
GFR calc non Af Amer: 85 mL/min/{1.73_m2} (ref >59)
GFR calc non Af Amer: >60 mL/min (ref >60)
Glucose, Bld: 127 mg/dL — ABNORMAL HIGH (ref 65–99)
Glucose: 131 mg/dL — ABNORMAL HIGH (ref 65–99)
Potassium: 4.4 mmol/L (ref 3.5–5.2)
Potassium: 4.4 mmol/L (ref 3.5–5.1)
Sodium: 138 mmol/L (ref 134–144)
Sodium: 138 mmol/L (ref 135–145)

## 2017-07-28 LAB — PROTIME-INR
INR: 1.12
Prothrombin Time: 14.3 seconds (ref 11.4–15.2)

## 2017-07-28 LAB — TSH: TSH: 1.91 u[IU]/mL (ref 0.350–4.500)

## 2017-07-28 LAB — I-STAT TROPONIN, ED
Troponin i, poc: 0.04 ng/mL (ref 0.00–0.08)
Troponin i, poc: 0.04 ng/mL (ref 0.00–0.08)

## 2017-07-28 MED ORDER — ASPIRIN 81 MG PO TBEC: 81.0000 mg | DELAYED_RELEASE_TABLET | Freq: Every day | ORAL | Status: DC

## 2017-07-28 MED ORDER — SODIUM CHLORIDE 0.9 % IV SOLN: INTRAVENOUS | Status: DC

## 2017-07-28 MED ORDER — COLCHICINE 0.6 MG PO TABS
0.6000 mg | ORAL_TABLET | Freq: Every day | ORAL | Status: DC
  Administered 2017-07-29: 0.6 mg via ORAL
  Filled 2017-07-28: qty 1

## 2017-07-28 MED ORDER — LEVOTHYROXINE SODIUM 112 MCG PO TABS
112.0000 ug | ORAL_TABLET | Freq: Every day | ORAL | Status: DC
  Administered 2017-07-29: 112 ug via ORAL
  Filled 2017-07-28: qty 1

## 2017-07-28 MED ORDER — ALBUTEROL SULFATE (2.5 MG/3ML) 0.083% IN NEBU: 3.0000 mL | INHALATION_SOLUTION | Freq: Four times a day (QID) | RESPIRATORY_TRACT | Status: DC | PRN

## 2017-07-28 MED ORDER — LIOTHYRONINE SODIUM 25 MCG PO TABS
25.0000 ug | ORAL_TABLET | Freq: Every morning | ORAL | Status: DC
  Filled 2017-07-28 (×2): qty 1

## 2017-07-28 MED ORDER — DILTIAZEM LOAD VIA INFUSION
15.0000 mg | Freq: Once | INTRAVENOUS | Status: AC
  Administered 2017-07-28: 15 mg via INTRAVENOUS
  Filled 2017-07-28: qty 15

## 2017-07-28 MED ORDER — TAMSULOSIN HCL 0.4 MG PO CAPS
0.4000 mg | ORAL_CAPSULE | Freq: Every day | ORAL | Status: DC
  Administered 2017-07-28: 0.4 mg via ORAL
  Filled 2017-07-28: qty 1

## 2017-07-28 MED ORDER — SODIUM CHLORIDE 0.9% FLUSH
3.0000 mL | Freq: Two times a day (BID) | INTRAVENOUS | Status: DC
  Administered 2017-07-29: 3 mL via INTRAVENOUS

## 2017-07-28 MED ORDER — ENSURE ENLIVE PO LIQD: 237.0000 mL | Freq: Two times a day (BID) | ORAL | Status: DC

## 2017-07-28 MED ORDER — FINASTERIDE 5 MG PO TABS
5.0000 mg | ORAL_TABLET | Freq: Every day | ORAL | Status: DC
  Administered 2017-07-28: 5 mg via ORAL
  Filled 2017-07-28: qty 1

## 2017-07-28 MED ORDER — DILTIAZEM HCL 100 MG IV SOLR
5.0000 mg/h | INTRAVENOUS | Status: DC
  Administered 2017-07-28: 5 mg/h via INTRAVENOUS
  Administered 2017-07-29: 10 mg/h via INTRAVENOUS
  Filled 2017-07-28 (×3): qty 100

## 2017-07-28 MED ORDER — WARFARIN SODIUM 5 MG PO TABS
5.0000 mg | ORAL_TABLET | Freq: Once | ORAL | Status: DC
Start: 2017-07-28 — End: 2017-07-28

## 2017-07-28 MED ORDER — ACETAMINOPHEN 325 MG PO TABS
650.0000 mg | ORAL_TABLET | ORAL | Status: DC | PRN
Start: 2017-07-28 — End: 2017-07-29
  Administered 2017-07-28: 650 mg via ORAL
  Filled 2017-07-28: qty 2

## 2017-07-28 MED ORDER — PANTOPRAZOLE SODIUM 40 MG PO TBEC
40.0000 mg | DELAYED_RELEASE_TABLET | Freq: Every day | ORAL | Status: DC
  Administered 2017-07-28 – 2017-07-29 (×2): 40 mg via ORAL
  Filled 2017-07-28 (×2): qty 1

## 2017-07-28 MED ORDER — MOMETASONE FURO-FORMOTEROL FUM 200-5 MCG/ACT IN AERO
2.0000 | INHALATION_SPRAY | Freq: Two times a day (BID) | RESPIRATORY_TRACT | Status: DC
  Administered 2017-07-29: 2 via RESPIRATORY_TRACT
  Filled 2017-07-28: qty 8.8

## 2017-07-28 MED ORDER — BENZONATATE 100 MG PO CAPS: 200.0000 mg | ORAL_CAPSULE | Freq: Four times a day (QID) | ORAL | Status: DC | PRN

## 2017-07-28 MED ORDER — ASPIRIN EC 81 MG PO TBEC
81.0000 mg | DELAYED_RELEASE_TABLET | Freq: Every day | ORAL | Status: DC
  Administered 2017-07-29: 81 mg via ORAL
  Filled 2017-07-28: qty 1

## 2017-07-28 MED ORDER — WARFARIN SODIUM 7.5 MG PO TABS
7.5000 mg | ORAL_TABLET | Freq: Once | ORAL | Status: AC
  Administered 2017-07-28: 7.5 mg via ORAL
  Filled 2017-07-28: qty 1

## 2017-07-28 MED ORDER — METOPROLOL TARTRATE 25 MG PO TABS
25.0000 mg | ORAL_TABLET | Freq: Two times a day (BID) | ORAL | Status: DC
  Administered 2017-07-28 – 2017-07-29 (×2): 25 mg via ORAL
  Filled 2017-07-28 (×2): qty 1

## 2017-07-28 MED ORDER — SODIUM CHLORIDE 0.9% FLUSH: 3.0000 mL | INTRAVENOUS | Status: DC | PRN

## 2017-07-28 MED ORDER — ASPIRIN 81 MG PO CHEW
324.0000 mg | CHEWABLE_TABLET | Freq: Once | ORAL | Status: AC
  Administered 2017-07-28: 324 mg via ORAL
  Filled 2017-07-28: qty 4

## 2017-07-28 MED ORDER — WARFARIN - PHARMACIST DOSING INPATIENT: Freq: Every day | Status: DC

## 2017-07-28 MED ORDER — SODIUM CHLORIDE 0.9 % IV SOLN: 250.0000 mL | INTRAVENOUS | Status: DC

## 2017-07-28 NOTE — H&P (Signed)
Cardiology Admission History and Physical:   Patient ID: JAESHAWN SILVIO; MRN: [id number]; DOB: 1951-05-07   Admission date: 07/28/2017  Primary Care Provider: Shirline Frees, MD Primary Cardiologist: Dr. [name]  Primary Electrophysiologist:  N/a Cardiothoracic Surgeon: Dr. Prescott Gum   Chief Complaint:  Atrial Fibrillation w/ RVR  Patient Profile:   XZAIVER VAYDA is a 66 y.o. male with a history of recent aortic valve/ root replacement (Bentall Procedure) 6/71/24 complicated by post operative afib, presenting back to the ED w/ recurrent afib w/ RVR.   History of Present Illness:   Mr. Limb is a 66 y/o male, followed by Dr. [name], with h/o bicuspid aortic valve w/ aortic insuffiencey and dilated aortic root, normal coronaries by cath and normal LVEF s/p recent Bentall Procedure by Dr. Prescott Gum 07/02/17 with pericardial tissue valve. Post operative course was complicated by atrial fibrillation w/ RVR, treated with amiodarone and coumadin. He also had post-operative delirium. He was discharged home on 07/10/17 and was in NSR by time of discharge.  He was seen by CT surgery on 07/15/17 and still having issues with delirium, which was felt related to amiodarone. This was discontinued and he was continued on Coreg. Delirium improved after amiodarone discontinued. He was seen yesterday in clinic for post hospital f/u by Truitt Merle, NP, and was noted to be still in NSR.   It was also noted that he has a h/o ETOH use but pt reported abstinence at yesterday's office visit.   Earlier this morning, pt presented to ED by EMS with complaints of SOB, near syncope and elevated HR and noted to be back in afib w/ RVR in the 120s. CBC with stable hgb at 12. BMP unremarkable. K WNL at 4.4. INR however is subtherapeutic at 1.12. He was placed on IV Cardizem in the ED. CXR unremarkable. POC troponin is negative. He remains in afib with vrates in the 110s. BP is stable. Symptoms improved some.   He  denies any recent n/v/d, fever or chills. He also denies any recent caffeine or ETOH. He notes normal PO intake. His wife is present by his beside and notes that he has been more fatigued over the last few days but has also be advancing with physical therapy, which they felt was contributing to his fatigue.    Past Medical History:  Diagnosis Date  . Allergic rhinitis   . Anxiety   . Arthritis   . Asthma   . Bladder tumor   . Chronic fatigue   . Chronic fatigue   . Depression    06/30/17 Pt denies being depressed, reports Effexor is taken for Chronic Fatigue   . Dyspnea   . Enlarged prostate   . Fibromyalgia   . GERD (gastroesophageal reflux disease)   . Headache   . History of chronic bronchitis   . History of migraine   . History of toxic encephalopathy   . Hypothyroidism   . OSA on CPAP    CPAP 14  . PAF (paroxysmal atrial fibrillation) ([place]) CARDIOLOGIST -- DR [name]   DX OCT 2013  . 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 Unspecified essential hypertension   . Urethral tumor    PROSTATIC    Past Surgical History:  Procedure Laterality Date  . BENTALL PROCEDURE N/A 07/02/2017   Procedure: BENTALL PROCEDURE;  Surgeon: Ivin Poot, MD;  Location: [facility];  Service: Open Heart Surgery;  Laterality: N/A;  WITH CIRC ARREST  . COLONOSCOPY    . CYSTOSCOPY  W/ RETROGRADES Bilateral 06/15/2013   Procedure: CYSTOSCOPY WITH BILATERAL RETROGRADE PYELOGRAM  BLADDER BIOPSY, PROSTATIC URETHRAL BIOPSY, ;  Surgeon: Molli Hazard, MD;  Location: [facility];  Service: Urology;  Laterality: Bilateral;  . LAPAROSCOPIC CHOLECYSTECTOMY  01-14-2001  . RIGHT/LEFT HEART CATH AND CORONARY ANGIOGRAPHY N/A 06/12/2017   Procedure: RIGHT/LEFT HEART CATH AND CORONARY ANGIOGRAPHY;  Surgeon: Larey Dresser, MD;  Location: [facility];  Service: Cardiovascular;  Laterality: N/A;  . TEE WITHOUT CARDIOVERSION N/A 07/02/2017   Procedure: TRANSESOPHAGEAL ECHOCARDIOGRAM (TEE);  Surgeon: Prescott Gum, Collier Salina, MD;  Location: [facility];  Service: Open Heart Surgery;  Laterality: N/A;  . UMBILICAL HERNIA REPAIR  01-14-2008     Medications Prior to Admission: Prior to Admission medications   Medication Sig Start Date End Date Taking? Authorizing Provider  acetaminophen (TYLENOL) 500 MG tablet Take 500 mg by mouth every 6 (six) hours as needed for moderate pain.   Yes [provider]  albuterol (VENTOLIN HFA) 108 (90 Base) MCG/ACT inhaler INHALE 2 PUFFS INTO LUNGS EVERY 6 HOURS AS NEEDED FOR WHEEZING OR SHORTNESS OF BREATH 01/19/17  Yes Young, Tarri Fuller D, MD  aspirin 81 MG EC tablet Take 1 tablet (81 mg total) by mouth daily. 07/13/17  Yes Gold, Wayne E, PA-C  azelastine (ASTELIN) 137 MCG/SPRAY nasal spray Place 1-2 sprays into the nose 2 (two) times daily as needed for rhinitis or allergies.    Yes [provider]  benzonatate (TESSALON) 200 MG capsule Take 1 capsule (200 mg total) by mouth every 6 (six) hours as needed for cough. 11/19/16  Yes Young, Tarri Fuller D, MD  budesonide-formoterol (SYMBICORT) 160-4.5 MCG/ACT inhaler INHALE 2 PUFF INTO THE LUNGS 2 TIMES A DAY. 01/19/17  Yes Young, Clinton D, MD  carvedilol (COREG) 12.5 MG tablet Take 1 tablet (12.5 mg total) by mouth 2 (two) times daily with a meal. 07/12/17  Yes Gold, Wayne E, PA-C  Cholecalciferol (VITAMIN D-3) 5000 UNITS TABS Take 5,000 Units by mouth daily with breakfast.    Yes [provider]  COLCRYS 0.6 MG tablet Take 0.6 mg by mouth daily. With breakfast 01/29/12  Yes [provider]  Cyanocobalamin (VITAMIN B12 SL) Place 1,200 mcg under the tongue daily. 1 dropper   Yes [provider]  DHEA 50 MG TABS Take 50 mg by mouth daily with breakfast.    Yes [provider]  Diphenhyd-Hydrocort-Nystatin (FIRST-DUKES MOUTHWASH) SUSP 10 ml's by mouth 4 times daily as needed for mouth pain 07/17/17  Yes Young, Tarri Fuller D, MD  fexofenadine (ALLEGRA) 180 MG tablet Take 180 mg by mouth daily with  breakfast.    Yes [provider]  finasteride (PROSCAR) 5 MG tablet Take 5 mg by mouth at bedtime.  01/14/14  Yes [provider]  levothyroxine (SYNTHROID, LEVOTHROID) 112 MCG tablet Take 112 mcg by mouth daily before breakfast.    Yes [provider]  liothyronine (CYTOMEL) 25 MCG tablet Take 25 mcg by mouth every morning.    Yes [provider]  magic mouthwash w/lidocaine SOLN Take 10 mLs by mouth 4 (four) times daily as needed for mouth pain. 07/16/17  Yes Young, Tarri Fuller D, MD  Menthol, Topical Analgesic, (BLUE-EMU MAXIMUM STRENGTH EX) Apply 1 application topically 4 (four) times daily as needed (for arthritis pain.).   Yes [provider]  milk thistle 175 MG tablet Take 175 mg by mouth daily.   Yes [provider]  Multiple Vitamins-Minerals (ONE-A-DAY MENS HEALTH FORMULA PO)  Take 1 tablet by mouth daily with breakfast.    Yes [provider]  omeprazole (PRILOSEC) 40 MG capsule Take 1 capsule (40 mg total) by mouth 2 (two) times daily. 10/31/16  Yes Josue Hector, MD  Probiotic Product (ALIGN) 4 MG CAPS Take 4 mg by mouth daily with breakfast.    Yes [provider]  tamsulosin (FLOMAX) 0.4 MG CAPS capsule Take 0.4 mg by mouth daily after supper.   Yes [provider]  tetrahydrozoline-zinc (VISINE-AC) 0.05-0.25 % ophthalmic solution Place 1 drop into both eyes as needed (dry eyes).   Yes [provider]  venlafaxine (EFFEXOR-XR) 150 MG 24 hr capsule Take 150 mg by mouth every morning.    Yes [provider]  dextromethorphan (DELSYM) 30 MG/5ML liquid Take 30 mg by mouth at bedtime as needed for cough.     [provider]  Hypromellose Vertell Limber ALLERGY NASAL SPRAY NA) Place 1 spray into both nostrils daily as needed (for allergies).     [provider]  perphenazine (TRILAFON) 2 MG tablet Take 2 mg by mouth 2 (two) times daily as needed (for migraines).     [provider]  testosterone cypionate (DEPOTESTOTERONE CYPIONATE) 100 MG/ML injection Inject 200 mg into the muscle every Thursday. For IM use only     [provider]  warfarin (COUMADIN) 2 MG tablet Take 2 mg by mouth daily at 6 PM. 2.5mg  Tues, Thursday, Sat & Sunday.  1.5mg  Monday, Wed, Friday  (As of 07/15/17)     [provider]     Allergies:    Allergies  Allergen Reactions  . Codeine Itching  . Tramadol     dellusion     Social History:   Social History   Social History  . Marital status: Married    Spouse name: N/A  . Number of children: N/A  . Years of education: N/A   Occupational History  . peanut selling     owner of buisness   Social History Main Topics  . Smoking status: Former Smoker    Packs/day: 0.50    Years: 27.00    Types: Cigarettes    Quit date: 10/21/1979  . Smokeless tobacco: Never Used  . Alcohol use 12.6 oz/week    21 Cans of beer per week     Comment: 4-5 beers a day  . Drug use: No  . Sexual activity: Not on file   Other Topics Concern  . Not on file   Social History Narrative  . No narrative on file    Family History:   The patient's family history includes Aortic aneurysm (age of onset: 5) in his mother; Heart disease in his unknown relative; Other in his father.    ROS:  Please see the history of present illness.  All other ROS reviewed and negative.     Physical Exam/Data:   Vitals:   07/28/17 0815 07/28/17 0830 07/28/17 0845 07/28/17 0930  BP: (!) 120/99 123/82 114/84 111/84  Pulse: (!) 59 (!) 124    Resp: 15 15 20    Temp:      TempSrc:      SpO2: 97% 96% 98% 97%   No intake or output data in the 24 hours ending 07/28/17 0957 There were no vitals filed for this visit. There is no height or weight on file to calculate BMI.  General:  Well nourished, well developed, in no acute distress, moderately obese  HEENT: normal Lymph: no adenopathy  Neck: noJVD Endocrine:  No thryomegaly Vascular: No carotid bruits;  FA pulses 2+ bilaterally without bruits  Cardiac:  irregularly irregular, tachy rate; no murmur  Lungs:  clear to auscultation bilaterally, no wheezing, rhonchi or rales  Abd: soft, nontender, no hepatomegaly  Ext: no edema Musculoskeletal:  No deformities, BUE and BLE strength normal and equal Skin: warm and dry  Neuro:  CNs 2-12 intact, no focal abnormalities noted Psych:  Normal affect    EKG:  The ECG that was done 07/28/17 was personally reviewed and demonstrates atrial fibrillation in the 120s.   Relevant CV Studies: 2D Echo 05/14/17 (Pre-operative)  Study Conclusions  - Left ventricle: The cavity size was mildly dilated. There was   moderate concentric hypertrophy. Systolic function was normal.   The estimated ejection fraction was in the range of 55% to 60%.   Wall motion was normal; there were no regional wall motion   abnormalities. Features are consistent with a pseudonormal left   ventricular filling pattern, with concomitant abnormal relaxation   and increased filling pressure (grade 2 diastolic dysfunction).   Doppler parameters are consistent with high ventricular filling   pressure. - Aortic valve: Bicuspid; normal thickness, mildly calcified   leaflets. There was moderate regurgitation. - Aorta: Aortic root dimension: 50 mm (ED). - Aortic root: The aortic root was moderately dilated. - Mitral valve: There was trivial regurgitation. - Right ventricle: The cavity size was mildly dilated. Wall   thickness was normal.  Impressions:  - Normal LVF, bicupid aortic valve with moderate AR visually (mild   by PHT 550msec but eccentric jet), severely dilated ascending   aorta at 5.4cm and aortic root at 5cm. Compared to last study the   aortic root (4.7cm>>5cm) and ascending aortic dimensions   (5.2cm>>5.4cm) have increased in size.  Laboratory Data:  Chemistry Recent Labs Lab 07/27/17 1158 07/28/17 0800  NA 138 138  K 4.4 4.4  CL 102 104  CO2 22 24    GLUCOSE 131* 127*  BUN 11 12  CREATININE 0.94 1.05  CALCIUM 9.4 9.3  GFRNONAA 85 >60  GFRAA 99 >60  ANIONGAP  --  10    No results for input(s): PROT, ALBUMIN, AST, ALT, ALKPHOS, BILITOT in the last 168 hours. Hematology Recent Labs Lab 07/27/17 1158 07/28/17 0800  WBC 9.0 8.9  RBC 4.24 4.31  HGB 12.1* 12.1*  HCT 36.3* 38.4*  MCV 86 89.1  MCH 28.5 28.1  MCHC 33.3 31.5  RDW 14.4 13.4  PLT 287 235   Cardiac EnzymesNo results for input(s): TROPONINI in the last 168 hours.  Recent Labs Lab 07/28/17 0824  TROPIPOC 0.04    BNPNo results for input(s): BNP, PROBNP in the last 168 hours.  DDimer No results for input(s): DDIMER in the last 168 hours.  Radiology/Studies:  Dg Chest Portable 1 View  Result Date: 07/28/2017 CLINICAL DATA:  Shortness of breath. EXAM: PORTABLE CHEST 1 VIEW COMPARISON:  07/15/2017 . FINDINGS: Prior cardiac valve replaced scratched it prior median sternotomy and cardiac valve replacement. Cardiomegaly with normal pulmonary vascularity. No focal infiltrate. No pleural effusion. Interim resolution of right-sided pneumothorax. Interim improvement right chest wall subcutaneous emphysema. Surgical clips right upper chest. IMPRESSION: 1. Interim resolution of right-sided pneumothorax. Improvement of right chest wall subcutaneous emphysema. 2. Prior median sternotomy and cardiac valve replacement. Stable cardiomegaly. No pulmonary venous congestion or CHF. Electronically Signed   By: Marcello Moores  Register   On: 07/28/2017 08:34    Assessment and Plan:   1.  Atrial Fibrillation w/ RVR: Pt with previous h/o PAF and with recurrence after recent AVR/ aortic root replacement. He converted with amiodarone post op, however amiodarone was discontinued 07/15/17 by CT surgery due to side effects/ intolerances (delirium). Pt continued on Coreg for rate control and coumadin for anticoagulation. Pt noted to be in NSR at [place] f/u visit yesterday. Later last night, he developed  SOB and palpitations that  persistent this morning. Also with near syncope. Found to be in recurrent afib w/ RVR in ED. K, H/H and SCr/BUN WNL. CXR negative. Troponin negative. He denies caffeine, ETOH, fever, chills/ n/v/d. Unable to perform cardioversion in ED given subtherapeutic INR at 1.12. We will continue IV Cardizem for now. If no spontaneous conversion, we set up for TEE guided DCCV tomorrow. Will need to use caution w/ other AADs given prolonged QT on EKG. May need EP consult if we are going to consider other options.  We will admit to telemetry. Check TSH. Monitor rate on telemetry. Monitor BP.  MD to follow with further recommendations.   2. H/o Bicuspid Aortic Valve w/ Aortic Insuffiencey and Dilated Aortic Root: s/p recent Bentall Procedure with tissue valve, per Dr. Prescott Gum 07/02/17. No concomitant CAD. Pre-op LHC with normal coronaries. Normal LVEF.    Severity of Illness: The appropriate patient status for this patient is OBSERVATION. Observation status is judged to be reasonable and necessary in order to provide the required intensity of service to ensure the patient's safety. The patient's presenting symptoms, physical exam findings, and initial radiographic and laboratory data in the context of their medical condition is felt to place them at decreased risk for further clinical deterioration. Furthermore, it is anticipated that the patient will be medically stable for discharge from the hospital within 2 midnights of admission. The following factors support the patient status of observation.   " The patient's presenting symptoms include dyspnea, palpitations and near syncope. " The physical exam findings include irregular heart rhythm/rate, (atrial fibrillation). " The initial radiographic and laboratory data are unremarkable.     For questions or updates, please contact [place] Please consult [email or web address] for contact info under Cardiology/STEMI.    Signed, Lyda Jester, PA-C  07/28/2017 9:57 AM

## 2017-07-28 NOTE — ED Notes (Signed)
Pt also reports increasing his coumadin last night due to INR of 1.1 on 10/8

## 2017-07-28 NOTE — ED Provider Notes (Signed)
[facility] DEPT Provider Note   CSN: [id number] Arrival date & time: 07/28/17  7619     History   Chief Complaint Chief Complaint  Patient presents with  . Near Syncope    HPI Grant [name] is a 66 y.o. male.  66 year old male history of moderate aortic insufficiency and bicuspid aortic valve S/P rectal Bentall procedure on 07/02/17, A. fib on coumadin, COPD, anxiety, chronic fatigue syndrome who presents with wife for near syncopal symptoms.  Patient noted intermittent shortness of breath since last evening.  This morning, he was standing in the bathroom when he felt onset of hot flash and presyncope. He was able to catch himself and did not fall.  He notes intermittent shortness of breath.  Was seen in cardiology clinic yesterday and noted to have INR of 1.1 (goal 2-3). Presents in A. Fib w/ RVR.   The history is provided by the patient, the spouse and medical records. No language interpreter was used.    Past Medical History:  Diagnosis Date  . Allergic rhinitis   . Anxiety   . Arthritis   . Asthma   . Bladder tumor   . Chronic fatigue   . Chronic fatigue   . Depression    06/30/17 Pt denies being depressed, reports Effexor is taken for Chronic Fatigue   . Dyspnea   . Enlarged prostate   . Fibromyalgia   . GERD (gastroesophageal reflux disease)   . Headache   . History of chronic bronchitis   . History of migraine   . History of toxic encephalopathy   . Hypothyroidism   . OSA on CPAP    CPAP 14  . PAF (paroxysmal atrial fibrillation) ([place]) CARDIOLOGIST -- DR [name]   DX OCT 2013  . 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 Unspecified essential hypertension   . Urethral tumor    PROSTATIC    Patient Active Problem List   Diagnosis Date Noted  . Atrial fibrillation with RVR ([place]) 07/28/2017  . Encounter for therapeutic drug monitoring 07/15/2017  . S/P AVR 07/02/2017  . Eczema 01/01/2014  . Hoarseness or changing voice 07/24/2013  . PAF (paroxysmal atrial  fibrillation) ([place]) 05/18/2013  . Chronic anticoagulation 05/18/2013  . Preop cardiovascular exam 05/18/2013  . Macular rash 08/10/2012  . Acute asthmatic bronchitis 05/05/2012  . Essential hypertension 06/17/2010  . SYNCOPE 06/14/2010  . DEPRESSION 02/11/2008  . Seasonal and perennial allergic rhinitis 02/11/2008  . Chronic bronchitis ([place]) 02/11/2008  . GERD (gastroesophageal reflux disease) 02/11/2008    Past Surgical History:  Procedure Laterality Date  . BENTALL PROCEDURE N/A 07/02/2017   Procedure: BENTALL PROCEDURE;  Surgeon: Ivin Poot, MD;  Location: [facility];  Service: Open Heart Surgery;  Laterality: N/A;  WITH CIRC ARREST  . COLONOSCOPY    . CYSTOSCOPY W/ RETROGRADES Bilateral 06/15/2013   Procedure: CYSTOSCOPY WITH BILATERAL RETROGRADE PYELOGRAM  BLADDER BIOPSY, PROSTATIC URETHRAL BIOPSY, ;  Surgeon: Molli Hazard, MD;  Location: [facility];  Service: Urology;  Laterality: Bilateral;  . LAPAROSCOPIC CHOLECYSTECTOMY  01-14-2001  . RIGHT/LEFT HEART CATH AND CORONARY ANGIOGRAPHY N/A 06/12/2017   Procedure: RIGHT/LEFT HEART CATH AND CORONARY ANGIOGRAPHY;  Surgeon: Larey Dresser, MD;  Location: [facility];  Service: Cardiovascular;  Laterality: N/A;  . TEE WITHOUT CARDIOVERSION N/A 07/02/2017   Procedure: TRANSESOPHAGEAL ECHOCARDIOGRAM (TEE);  Surgeon: Prescott Gum, Collier Salina, MD;  Location: [facility];  Service: Open Heart Surgery;  Laterality: N/A;  . UMBILICAL HERNIA REPAIR  01-14-2008  Home Medications    Prior to Admission medications   Medication Sig Start Date End Date Taking? Authorizing Provider  acetaminophen (TYLENOL) 500 MG tablet Take 500 mg by mouth every 6 (six) hours as needed for moderate pain.   Yes [provider]  albuterol (VENTOLIN HFA) 108 (90 Base) MCG/ACT inhaler INHALE 2 PUFFS INTO LUNGS EVERY 6 HOURS AS NEEDED FOR WHEEZING OR SHORTNESS OF BREATH 01/19/17  Yes Young, Tarri Fuller D, MD  aspirin 81 MG EC tablet Take 1  tablet (81 mg total) by mouth daily. 07/13/17  Yes Gold, Wayne E, PA-C  azelastine (ASTELIN) 137 MCG/SPRAY nasal spray Place 1-2 sprays into the nose 2 (two) times daily as needed for rhinitis or allergies.    Yes [provider]  benzonatate (TESSALON) 200 MG capsule Take 1 capsule (200 mg total) by mouth every 6 (six) hours as needed for cough. 11/19/16  Yes Young, Tarri Fuller D, MD  budesonide-formoterol (SYMBICORT) 160-4.5 MCG/ACT inhaler INHALE 2 PUFF INTO THE LUNGS 2 TIMES A DAY. 01/19/17  Yes Young, Clinton D, MD  carvedilol (COREG) 12.5 MG tablet Take 1 tablet (12.5 mg total) by mouth 2 (two) times daily with a meal. 07/12/17  Yes Gold, Wayne E, PA-C  Cholecalciferol (VITAMIN D-3) 5000 UNITS TABS Take 5,000 Units by mouth daily with breakfast.    Yes [provider]  COLCRYS 0.6 MG tablet Take 0.6 mg by mouth daily. With breakfast 01/29/12  Yes [provider]  Cyanocobalamin (VITAMIN B12 SL) Place 1,200 mcg under the tongue daily. 1 dropper   Yes [provider]  DHEA 50 MG TABS Take 50 mg by mouth daily with breakfast.    Yes [provider]  Diphenhyd-Hydrocort-Nystatin (FIRST-DUKES MOUTHWASH) SUSP 10 ml's by mouth 4 times daily as needed for mouth pain 07/17/17  Yes Young, Tarri Fuller D, MD  fexofenadine (ALLEGRA) 180 MG tablet Take 180 mg by mouth daily with breakfast.    Yes [provider]  finasteride (PROSCAR) 5 MG tablet Take 5 mg by mouth at bedtime.  01/14/14  Yes [provider]  levothyroxine (SYNTHROID, LEVOTHROID) 112 MCG tablet Take 112 mcg by mouth daily before breakfast.    Yes [provider]  liothyronine (CYTOMEL) 25 MCG tablet Take 25 mcg by mouth every morning.    Yes [provider]  magic mouthwash w/lidocaine SOLN Take 10 mLs by mouth 4 (four) times daily as needed for mouth pain. 07/16/17  Yes Young, Tarri Fuller D, MD  Menthol, Topical Analgesic, (BLUE-EMU MAXIMUM STRENGTH EX) Apply 1 application topically  4 (four) times daily as needed (for arthritis pain.).   Yes [provider]  milk thistle 175 MG tablet Take 175 mg by mouth daily.   Yes [provider]  Multiple Vitamins-Minerals (ONE-A-DAY MENS HEALTH FORMULA PO) Take 1 tablet by mouth daily with breakfast.    Yes [provider]  omeprazole (PRILOSEC) 40 MG capsule Take 1 capsule (40 mg total) by mouth 2 (two) times daily. 10/31/16  Yes Josue Hector, MD  Probiotic Product (ALIGN) 4 MG CAPS Take 4 mg by mouth daily with breakfast.    Yes [provider]  tamsulosin (FLOMAX) 0.4 MG CAPS capsule Take 0.4 mg by mouth daily after supper.   Yes [provider]  tetrahydrozoline-zinc (VISINE-AC) 0.05-0.25 % ophthalmic solution Place 1 drop into both eyes as needed (dry eyes).   Yes [provider]  venlafaxine (EFFEXOR-XR) 150 MG 24 hr capsule Take 150 mg by mouth every morning.  Yes [provider]  dextromethorphan (DELSYM) 30 MG/5ML liquid Take 30 mg by mouth at bedtime as needed for cough.     [provider]  Hypromellose Vertell Limber ALLERGY NASAL SPRAY NA) Place 1 spray into both nostrils daily as needed (for allergies).     [provider]  perphenazine (TRILAFON) 2 MG tablet Take 2 mg by mouth 2 (two) times daily as needed (for migraines).     [provider]  testosterone cypionate (DEPOTESTOTERONE CYPIONATE) 100 MG/ML injection Inject 200 mg into the muscle every Thursday. For IM use only     [provider]  warfarin (COUMADIN) 2 MG tablet Take 2 mg by mouth daily at 6 PM. 2.5mg  Tues, Thursday, Sat & Sunday.  1.5mg  Monday, Wed, Friday  (As of 07/15/17)     [provider]    Family History Family History  Problem Relation Age of Onset  . Aortic aneurysm Mother 70       cause of death  . Other Father        motor vehicle accident  . Heart disease Unknown        family history    Social History Social History  Substance Use  Topics  . Smoking status: Former Smoker    Packs/day: 0.50    Years: 27.00    Types: Cigarettes    Quit date: 10/21/1979  . Smokeless tobacco: Never Used  . Alcohol use 12.6 oz/week    21 Cans of beer per week     Comment: 4-5 beers a day     Allergies   Codeine and Tramadol   Review of Systems Review of Systems  Constitutional: Negative for chills and fever.  HENT: Negative for ear pain and sore throat.   Eyes: Negative for pain and visual disturbance.  Respiratory: Positive for shortness of breath. Negative for cough.   Cardiovascular: Negative for chest pain and palpitations.  Gastrointestinal: Negative for abdominal pain and vomiting.  Genitourinary: Negative for dysuria and hematuria.  Musculoskeletal: Negative for arthralgias and back pain.  Skin: Negative for color change and rash.  Neurological: Positive for syncope. Negative for seizures.  All other systems reviewed and are negative.    Physical Exam Updated Vital Signs BP (!) 130/104   Pulse 99   Temp 97.7 F (36.5 C) (Oral)   Resp 14   SpO2 94%   Physical Exam  Constitutional: He appears well-developed. No distress.  HENT:  Head: Normocephalic and atraumatic.  Eyes: Conjunctivae are normal.  Neck: Neck supple.  Cardiovascular: An irregularly irregular rhythm present. Tachycardia present.   Midline chest surgical scar c/d/i  Pulmonary/Chest: Effort normal and breath sounds normal. No respiratory distress.  Abdominal: Soft. He exhibits no distension. There is no tenderness. There is no guarding.  Musculoskeletal: He exhibits no edema.  Neurological: He is alert. No cranial nerve deficit. Coordination normal.  Moves all extremities  Skin: Skin is warm and dry.  Nursing note and vitals reviewed.    ED Treatments / Results  Labs (all labs ordered are listed, but only abnormal results are displayed) Labs Reviewed  BASIC METABOLIC PANEL - Abnormal; Notable for the following:       Result Value    Glucose, Bld 127 (*)    All other components within normal limits  CBC - Abnormal; Notable for the following:    Hemoglobin 12.1 (*)    HCT 38.4 (*)    All other components within normal limits  PROTIME-INR  TSH  HIV ANTIBODY (ROUTINE TESTING)  I-STAT TROPONIN, ED  I-STAT TROPONIN, ED    EKG  EKG Interpretation None       Radiology Dg Chest Portable 1 View  Result Date: 07/28/2017 CLINICAL DATA:  Shortness of breath. EXAM: PORTABLE CHEST 1 VIEW COMPARISON:  07/15/2017 . FINDINGS: Prior cardiac valve replaced scratched it prior median sternotomy and cardiac valve replacement. Cardiomegaly with normal pulmonary vascularity. No focal infiltrate. No pleural effusion. Interim resolution of right-sided pneumothorax. Interim improvement right chest wall subcutaneous emphysema. Surgical clips right upper chest. IMPRESSION: 1. Interim resolution of right-sided pneumothorax. Improvement of right chest wall subcutaneous emphysema. 2. Prior median sternotomy and cardiac valve replacement. Stable cardiomegaly. No pulmonary venous congestion or CHF. Electronically Signed   By: Marcello Moores  Register   On: 07/28/2017 08:34    Procedures Procedures (including critical care time)  Medications Ordered in ED Medications  diltiazem (CARDIZEM) 100 mg in dextrose 5 % 100 mL (1 mg/mL) infusion (10 mg/hr Intravenous Rate/Dose Change 07/28/17 1045)  acetaminophen (TYLENOL) tablet 650 mg (not administered)  metoprolol tartrate (LOPRESSOR) tablet 25 mg (not administered)  albuterol (PROVENTIL) (2.5 MG/3ML) 0.083% nebulizer solution 3 mL (not administered)  benzonatate (TESSALON) capsule 200 mg (not administered)  mometasone-formoterol (DULERA) 200-5 MCG/ACT inhaler 2 puff (2 puffs Inhalation Not Given 07/28/17 1300)  colchicine tablet 0.6 mg (not administered)  finasteride (PROSCAR) tablet 5 mg (not administered)  levothyroxine (SYNTHROID, LEVOTHROID) tablet 112 mcg (not administered)  liothyronine (CYTOMEL)  tablet 25 mcg (not administered)  pantoprazole (PROTONIX) EC tablet 40 mg (not administered)  tamsulosin (FLOMAX) capsule 0.4 mg (not administered)  sodium chloride flush (NS) 0.9 % injection 3 mL (not administered)  sodium chloride flush (NS) 0.9 % injection 3 mL (not administered)  0.9 %  sodium chloride infusion (not administered)  Warfarin - Pharmacist Dosing Inpatient (not administered)  warfarin (COUMADIN) tablet 7.5 mg (not administered)  aspirin EC tablet 81 mg (not administered)  aspirin chewable tablet 324 mg (324 mg Oral Given 07/28/17 0833)  diltiazem (CARDIZEM) 1 mg/mL load via infusion 15 mg (15 mg Intravenous Bolus from Bag 07/28/17 0956)     Initial Impression / Assessment and Plan / ED Course  I have reviewed the triage vital signs and the nursing notes.  Pertinent labs & imaging results that were available during my care of the patient were reviewed by me and considered in my medical decision making (see chart for details).     63 yoM h/o moderate aortic insufficiency and bicuspid aortic valve S/P rectal Bentall procedure on 07/02/17, A. fib on coumadin, COPD, anxiety, chronic fatigue syndrome who p/w multiple episodes of near syncope while standing in bathroom. Presents in A fib with RVR. Subtherapeutic on INR noted yesterday. VSS. Lungs CTAB. Abdomen soft, benign throughout.  EKG showing A. Fib w/ RVR. IV dilt given. Troponin 0.04.  CXR showing interim resolution of R PTX. Cardiology consulted in setting of recent Bentall procedure. Pt admitted for further management and evaluation.   Pt care d/w Dr. Lita Mains  Final Clinical Impressions(s) / ED Diagnoses   Final diagnoses:  Atrial fibrillation with RVR ([place])  Near syncope    New Prescriptions New Prescriptions   No medications on file     Payton Emerald, MD 07/28/17 1653    Julianne Rice, MD 07/31/17 [phone]

## 2017-07-28 NOTE — ED Triage Notes (Signed)
Per EMS- pt has SOB yesterday. Then had a near syncopal episode this morning. Reports nausea. 120HR 130/92  (lying) 160HR 100 palp (sitting). Pt is a fib on monitor. At one point pt HR increased to 190. LH IV with NS running CBG 141. Pt denies pain

## 2017-07-28 NOTE — ED Notes (Signed)
Ordered heart healthy meal tray for pt. 

## 2017-07-28 NOTE — Progress Notes (Signed)
ANTICOAGULATION CONSULT NOTE - Initial Consult  Pharmacy Consult for warfarin Indication: atrial fibrillation  Allergies  Allergen Reactions  . Codeine Itching  . Tramadol     dellusion     Patient Measurements:   Vital Signs: Temp: 97.7 F (36.5 C) (10/09 0753) Temp Source: Oral (10/09 0753) BP: 114/83 (10/09 1130) Pulse Rate: 108 (10/09 1130)  Labs:  Recent Labs  07/27/17 1038 07/27/17 1158 07/28/17 0800  HGB  --  12.1* 12.1*  HCT  --  36.3* 38.4*  PLT  --  287 235  LABPROT  --   --  14.3  INR 1.1  --  1.12  CREATININE  --  0.94 1.05    Estimated Creatinine Clearance: 90.6 mL/min (by C-G formula based on SCr of 1.05 mg/dL).   Medical History: Past Medical History:  Diagnosis Date  . Allergic rhinitis   . Anxiety   . Arthritis   . Asthma   . Bladder tumor   . Chronic fatigue   . Chronic fatigue   . Depression    06/30/17 Pt denies being depressed, reports Effexor is taken for Chronic Fatigue   . Dyspnea   . Enlarged prostate   . Fibromyalgia   . GERD (gastroesophageal reflux disease)   . Headache   . History of chronic bronchitis   . History of migraine   . History of toxic encephalopathy   . Hypothyroidism   . OSA on CPAP    CPAP 14  . PAF (paroxysmal atrial fibrillation) ([place]) CARDIOLOGIST -- DR [name]   DX OCT 2013  . 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 Unspecified essential hypertension   . Urethral tumor    PROSTATIC    Assessment: 66 yo male s/p recent AVR, with post-op AFib on warfarin, hx of non-compliance and SUBtherapeutic INRs. Admitted in AFib with INR 1.1.  PTA warfarin: 5 mg po daily  Goal of Therapy:  INR 2-3 Monitor platelets by anticoagulation protocol: Yes    Plan:  -Warfarin 7.5 mg po x1 -Daily INR  Harvel Quale 07/28/2017,12:02 PM

## 2017-07-28 NOTE — ED Notes (Signed)
Pt reports being short of breath. O2 level 96%. Feeling relieved by sitting pt up in bed and applying 2L [place]. Denies pain.

## 2017-07-28 NOTE — ED Notes (Signed)
Attempted report at this time, line remains busy.

## 2017-07-28 NOTE — Telephone Encounter (Signed)
New Message     Pt wife is returning your call for lab results, Jance has been admitted to [facility] hospital for AFIB

## 2017-07-29 ENCOUNTER — Other Ambulatory Visit: Payer: Self-pay | Admitting: Internal Medicine

## 2017-07-29 ENCOUNTER — Encounter (HOSPITAL_COMMUNITY)
Admission: EM | Disposition: A | Discharge status: Home / Self Care | Payer: Self-pay | Source: Home / Self Care | Attending: Emergency Medicine

## 2017-07-29 ENCOUNTER — Encounter (HOSPITAL_COMMUNITY): Payer: Self-pay | Admitting: Anesthesiology

## 2017-07-29 ENCOUNTER — Other Ambulatory Visit (HOSPITAL_COMMUNITY): Payer: Medicare Other

## 2017-07-29 ENCOUNTER — Ambulatory Visit: Payer: Medicare Other | Admitting: Cardiothoracic Surgery

## 2017-07-29 DIAGNOSIS — I480 Paroxysmal atrial fibrillation: Secondary | ICD-10-CM | POA: Diagnosis not present

## 2017-07-29 DIAGNOSIS — Z952 Presence of prosthetic heart valve: Secondary | ICD-10-CM | POA: Diagnosis not present

## 2017-07-29 DIAGNOSIS — I10 Essential (primary) hypertension: Secondary | ICD-10-CM | POA: Diagnosis not present

## 2017-07-29 DIAGNOSIS — G4733 Obstructive sleep apnea (adult) (pediatric): Secondary | ICD-10-CM | POA: Diagnosis not present

## 2017-07-29 DIAGNOSIS — Z7901 Long term (current) use of anticoagulants: Secondary | ICD-10-CM

## 2017-07-29 DIAGNOSIS — I4891 Unspecified atrial fibrillation: Secondary | ICD-10-CM | POA: Diagnosis not present

## 2017-07-29 DIAGNOSIS — J449 Chronic obstructive pulmonary disease, unspecified: Secondary | ICD-10-CM | POA: Diagnosis not present

## 2017-07-29 DIAGNOSIS — E039 Hypothyroidism, unspecified: Secondary | ICD-10-CM | POA: Diagnosis not present

## 2017-07-29 LAB — BASIC METABOLIC PANEL
Anion gap: 11 (ref 5–15)
BUN: 11 mg/dL (ref 6–20)
CO2: 24 mmol/L (ref 22–32)
Calcium: 9.3 mg/dL (ref 8.9–10.3)
Chloride: 105 mmol/L (ref 101–111)
Creatinine, Ser: 0.84 mg/dL (ref 0.61–1.24)
GFR calc Af Amer: >60 mL/min (ref >60)
GFR calc non Af Amer: >60 mL/min (ref >60)
Glucose, Bld: 108 mg/dL — ABNORMAL HIGH (ref 65–99)
Potassium: 4.1 mmol/L (ref 3.5–5.1)
Sodium: 140 mmol/L (ref 135–145)

## 2017-07-29 LAB — MAGNESIUM: Magnesium: 1.7 mg/dL (ref 1.7–2.4)

## 2017-07-29 LAB — PROTIME-INR
INR: 1.18
Prothrombin Time: 14.9 seconds (ref 11.4–15.2)

## 2017-07-29 LAB — HIV ANTIBODY (ROUTINE TESTING W REFLEX): HIV Screen 4th Generation wRfx: NONREACTIVE

## 2017-07-29 SURGERY — ECHOCARDIOGRAM, TRANSESOPHAGEAL
Anesthesia: Monitor Anesthesia Care

## 2017-07-29 MED ORDER — WARFARIN SODIUM 7.5 MG PO TABS: 7.5000 mg | ORAL_TABLET | Freq: Once | ORAL | Status: DC

## 2017-07-29 MED ORDER — WARFARIN SODIUM 2.5 MG PO TABS
2.5000 mg | ORAL_TABLET | Freq: Every day | ORAL | 0 refills | Status: DC
Start: 2017-07-29 — End: 2017-08-21

## 2017-07-29 MED ORDER — AMIODARONE HCL 200 MG PO TABS: 200.0000 mg | ORAL_TABLET | Freq: Every day | ORAL | 3 refills | Status: DC

## 2017-07-29 MED ORDER — DILTIAZEM HCL ER COATED BEADS 120 MG PO CP24
120.0000 mg | ORAL_CAPSULE | Freq: Every day | ORAL | Status: DC
  Administered 2017-07-29: 120 mg via ORAL
  Filled 2017-07-29: qty 1

## 2017-07-29 MED ORDER — METOPROLOL TARTRATE 50 MG PO TABS: 50.0000 mg | ORAL_TABLET | Freq: Two times a day (BID) | ORAL | 3 refills | Status: DC

## 2017-07-29 NOTE — Progress Notes (Signed)
ANTICOAGULATION CONSULT NOTE - Follow up  Pharmacy Consult for warfarin Indication: atrial fibrillation  Allergies  Allergen Reactions  . Codeine Itching  . Tramadol     dellusion     Patient Measurements:   Vital Signs: Temp: 98.1 F (36.7 C) (10/10 1309) Temp Source: Oral (10/10 1309) BP: 132/89 (10/10 1309) Pulse Rate: 74 (10/10 1309)  Labs:  Recent Labs  07/27/17 1038 07/27/17 1158 07/28/17 0800 07/29/17 0341  HGB  --  12.1* 12.1*  --   HCT  --  36.3* 38.4*  --   PLT  --  287 235  --   LABPROT  --   --  14.3 14.9  INR 1.1  --  1.12 1.18  CREATININE  --  0.94 1.05 0.84    Estimated Creatinine Clearance: 112 mL/min (by C-G formula based on SCr of 0.84 mg/dL).   Medical History: Past Medical History:  Diagnosis Date  . Allergic rhinitis   . Anxiety   . Arthritis   . Asthma   . Bladder tumor   . Chronic fatigue   . Chronic fatigue   . Depression    06/30/17 Pt denies being depressed, reports Effexor is taken for Chronic Fatigue   . Dyspnea   . Enlarged prostate   . Fibromyalgia   . GERD (gastroesophageal reflux disease)   . Headache   . History of chronic bronchitis   . History of migraine   . History of toxic encephalopathy   . Hypothyroidism   . OSA on CPAP    CPAP 14  . PAF (paroxysmal atrial fibrillation) ([place]) CARDIOLOGIST -- DR [name]   DX OCT 2013  . 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 Unspecified essential hypertension   . Urethral tumor    PROSTATIC    Assessment: 65 yo male s/p recent AVR, with post-op AFib on warfarin, hx of non-compliance and SUBtherapeutic INRs. Admitted on 10/9 in AFib with INR 1.1. Note patient had been on Xarelto previously for history of transient [place] the [place], on Xarelto since that time until switched on 07/05/17 to Warfarin s/p recent AVR Today INR = 1.18, remains subtherapeutic.    PTA warfarin:  3.375mg  daily except 5mg  every Mon & Friday.  (has 2.5mg  tablets).  On 10/8  outpatient AC visit, pt instructed to increase dose to  5 mg po daily  (note Amiodarone was DC'd on 9/26)  History:  Anti-coag visit 10/8: INR 1.1 on  Warfarin 5mg  x 2 days (10/1 &2) then 3.375mg  daily except 5mg  every Mon & Friday.  Warfarin dose increased on this 10/8 AC visit to 5mg  daily  --with note that on 9/25 Amiodarone 200mg  changed from BID to daily,  On 07/15/17 --amiodarone discontinued.   Anticoag-visit 9/26:  INR = 2.2 on  Previous admission 9/13-9/23/18: Coumadin dose-9/16-5mg , 9/17-5mg , 9/18-7.5mg  9/18-7.5mg , 9/19-7.5mg , 9/20-7.5mg , 9/21-5mg , 9/22-2.5mg , 9/23-home on 2.5mg    + started on Amiodarone 07/03/17, discharged on amio 200 mg daily, Amio stopped by MD on 07/15/17.  +Augmentin 875mg  po BID x7 days (9/28>>10/5)  Unknown if compliant with all medications.  Goal of Therapy:  INR 2-3 Monitor platelets by anticoagulation protocol: Yes    Plan:  -Warfarin 7.5 mg po x1 -Daily INR   Nicole Cella, RPh Clinical Pharmacist Pager: [phone] 8a-330p [phone] 330p-1030p phone [phone] or [phone] Main pharmacy [phone] 07/29/2017,1:14 PM

## 2017-07-29 NOTE — Progress Notes (Signed)
Progress Note  Patient Name: Grant [name] Date of Encounter: 07/29/2017  Primary Cardiologist: Dr. Johnsie [name]   Subjective   Feeling well. No chest pain, sob or palpitations.   Inpatient Medications    Scheduled Meds: . aspirin EC  81 mg Oral Daily  . colchicine  0.6 mg Oral Daily  . feeding supplement (ENSURE ENLIVE)  237 mL Oral BID BM  . finasteride  5 mg Oral QHS  . levothyroxine  112 mcg Oral QAC breakfast  . liothyronine  25 mcg Oral q morning - 10a  . metoprolol tartrate  25 mg Oral BID  . mometasone-formoterol  2 puff Inhalation BID  . pantoprazole  40 mg Oral Daily  . sodium chloride flush  3 mL Intravenous Q12H  . tamsulosin  0.4 mg Oral QPC supper  . Warfarin - Pharmacist Dosing Inpatient   Does not apply q1800   Continuous Infusions: . sodium chloride    . sodium chloride    . diltiazem (CARDIZEM) infusion 5 mg/hr (07/29/17 0215)   PRN Meds: acetaminophen, albuterol, benzonatate, sodium chloride flush   Vital Signs    Vitals:   07/29/17 0305 07/29/17 0505 07/29/17 0607 07/29/17 0649  BP: 130/71 117/79 108/83 124/88  Pulse:   72   Resp:   15   Temp:   97.9 F (36.6 C)   TempSrc:   Oral   SpO2: 94%  95%   Weight:   249 lb 12.8 oz (113.3 kg)   Height:        Intake/Output Summary (Last 24 hours) at 07/29/17 0748 Last data filed at 07/29/17 0300  Gross per 24 hour  Intake           317.75 ml  Output              800 ml  Net          -482.25 ml   Filed Weights   07/29/17 7846  Weight: 249 lb 12.8 oz (113.3 kg)    Telemetry    Sinus rhythm at controlled rate- Personally Reviewed  ECG    Sinus rhythm - Personally Reviewed  Physical Exam   GEN: Obese male in no acute distress on CPAP Neck: No JVD Cardiac: RRR, no murmurs, rubs, or gallops.  Respiratory: Clear to auscultation bilaterally. GI: Soft, nontender, non-distended  MS: No edema; No deformity. Neuro:  Nonfocal  Psych: Normal affect   Labs    Chemistry Recent Labs Lab  07/27/17 1158 07/28/17 0800 07/29/17 0341  NA 138 138 140  K 4.4 4.4 4.1  CL 102 104 105  CO2 22 24 24   GLUCOSE 131* 127* 108*  BUN 11 12 11   CREATININE 0.94 1.05 0.84  CALCIUM 9.4 9.3 9.3  GFRNONAA 85 >60 >60  GFRAA 99 >60 >60  ANIONGAP  --  10 11     Hematology Recent Labs Lab 07/27/17 1158 07/28/17 0800  WBC 9.0 8.9  RBC 4.24 4.31  HGB 12.1* 12.1*  HCT 36.3* 38.4*  MCV 86 89.1  MCH 28.5 28.1  MCHC 33.3 31.5  RDW 14.4 13.4  PLT 287 235    Cardiac EnzymesNo results for input(s): TROPONINI in the last 168 hours.  Recent Labs Lab 07/28/17 0824 07/28/17 1156  TROPIPOC 0.04 0.04       Radiology    Dg Chest Portable 1 View  Result Date: 07/28/2017 CLINICAL DATA:  Shortness of breath. EXAM: PORTABLE CHEST 1 VIEW COMPARISON:  07/15/2017 . FINDINGS: Prior cardiac valve  replaced scratched it prior median sternotomy and cardiac valve replacement. Cardiomegaly with normal pulmonary vascularity. No focal infiltrate. No pleural effusion. Interim resolution of right-sided pneumothorax. Interim improvement right chest wall subcutaneous emphysema. Surgical clips right upper chest. IMPRESSION: 1. Interim resolution of right-sided pneumothorax. Improvement of right chest wall subcutaneous emphysema. 2. Prior median sternotomy and cardiac valve replacement. Stable cardiomegaly. No pulmonary venous congestion or CHF. Electronically Signed   By: Grant [name]  Grant [name]   On: 07/28/2017 08:34    Cardiac Studies   None this admission   Patient Profile     Mr. Grant [name] is a 80M with paroxysmal atrial fibrillation, hypertension, OSA on CPAP and bicuspid aortic valve s/p Bentall and bioprosthetic (42mm Edwards pericardial Magna Ease) AVR here with atrial fibrillation with RVR.   Assessment & Plan    1. PAF - Recurrent atrial fibrillation. Unable to tolerate Amiodarone during last admission. Converted to sinus rhythm overnight on IV cardizem. Will change to Cardizem CD 120mg . Continue  BB. Last EKG showed QT/QTc 448/480 ms. TSH normal. EP consult for antiarrhythmic options. Coumadin per pharmacy. INR 1.18.   2. HTN - Stable on current regimen   3.  H/o Bicuspid Aortic Valve w/ Aortic Insuffiencey and Dilated Aortic Root: s/p recent Bentall Procedure with tissue valve, per Dr. Prescott [name] 07/02/17  For questions or updates, please contact [name] Please consult [email or web address] for contact info under Cardiology/STEMI.      Signed, Grant Kail, PA  07/29/2017, 7:48 AM

## 2017-07-29 NOTE — Progress Notes (Signed)
Patient converted to Sinus Rhythm, HR in the 70-80's. Cardiology spoken to. EKG completed. Cardizem drip to be kept on, decreased to 5 cc/hr due to decrease in HR and lowering BP. Changing to PO will be addressed in AM.

## 2017-07-29 NOTE — Progress Notes (Signed)
Nutrition Brief Note  Patient identified on the Malnutrition Screening Tool (MST) Report. Patient reports a lot of weight loss after recent procedure due to eating poorly for 1 week after surgery. Since d/c home, he has had a good appetite and good intake.  Nutrition-Focused physical exam completed. Findings are no fat depletion, no muscle depletion, and no edema.   Wt Readings from Last 15 Encounters:  07/29/17 249 lb 12.8 oz (113.3 kg)  07/27/17 255 lb 3.2 oz (115.8 kg)  07/15/17 270 lb (122.5 kg)  07/12/17 275 lb 4.8 oz (124.9 kg)  06/30/17 279 lb 2 oz (126.6 kg)  06/17/17 240 lb (108.9 kg)  06/12/17 270 lb (122.5 kg)  05/27/17 282 lb (127.9 kg)  05/05/17 277 lb 12.8 oz (126 kg)  01/19/17 276 lb 9.6 oz (125.5 kg)  01/14/17 270 lb (122.5 kg)  10/31/16 271 lb 9.6 oz (123.2 kg)  10/31/16 271 lb 12.8 oz (123.3 kg)  09/05/16 265 lb (120.2 kg)  08/21/16 269 lb (122 kg)    Body mass index is 35.84 kg/m. Patient meets criteria for class 2 obesity based on current BMI.   Current diet order is heart healthy, patient is consuming approximately 100% of meals at this time. Labs and medications reviewed.   No nutrition interventions warranted at this time. If nutrition issues arise, please consult RD.   Molli Barrows, RD, LDN, [place] Pager [phone] After Hours Pager [phone]

## 2017-07-29 NOTE — Discharge Summary (Signed)
Discharge Summary    Patient ID: Grant [name],  MRN: [id number], DOB/AGE: 64-Oct-1952 64 y.o.  Admit date: 07/28/2017 Discharge date: 07/29/2017  Primary Care Provider: Shirline Frees Primary Cardiologist: Dr. [name]   Discharge Diagnoses    Active Problems:   Atrial fibrillation with RVR ([place]) PAF HTN H/o Bicuspid Aortic Valve w/ Aortic Insuffiencey and Dilated Aortic Root  Allergies Allergies  Allergen Reactions  . Codeine Itching  . Tramadol     dellusion     Diagnostic Studies/Procedures    None this admission    History of Present Illness     Grant [name] is a 66M with paroxysmal atrial fibrillation, hypertension, OSA on CPAP and bicuspid aortic valve s/p Bentall and bioprosthetic (14mm Edwards pericardial Magna Ease) AVR here 07/28/17 with atrial fibrillation with RVR.   H/o bicuspid aortic valve w/ aortic insuffiencey and dilated aortic root, normal coronaries by cath and normal LVEF s/p recent Bentall Procedure by Dr. Prescott Gum 07/02/17 with pericardial tissue valve. Post operative course was complicated by atrial fibrillation w/ RVR, treated with amiodarone and coumadin. He also had post-operative delirium. He was discharged home on 07/10/17 and was in NSR by time of discharge.  He was seen by CT surgery on 07/15/17 and still having issues with delirium, which was felt related to amiodarone. This was discontinued and he was continued on Coreg. Delirium improved after amiodarone discontinued.   He was seen 07/27/17 in clinic for post hospital f/u by Truitt Merle, NP, and was noted to be still in NSR.   It was also noted that he has a h/o ETOH use but pt reported abstinence at yesterday's office visit.   Earlier in morning of 07/28/17, pt presented to ED by EMS with complaints of SOB, near syncope and elevated HR and noted to be back in afib w/ RVR in the 120s. CBC with stable hgb at 12. BMP unremarkable. K WNL at 4.4. INR however is subtherapeutic at 1.12. He was  placed on IV Cardizem in the ED. CXR unremarkable. POC troponin is negative. He remains in afib with vrates in the 110s. BP is stable. Symptoms improved some.   He denies any recent n/v/d, fever or chills. He also denies any recent caffeine or ETOH. He notes normal PO intake. His wife is present by his beside and notes that he has been more fatigued over the last few days but has also be advancing with physical therapy, which they felt was contributing to his fatigue.   Hospital Course     Consultants: EP  1. PAF Patient is admitted and started on IV cardizem. Converted to sinus rhythm overnight on IV cardizem. Started on metoprolol 25mg  BID that increased at discharge. Repeat EKG showed QT/QTc 448/480 ms. TSH normal. EP consulted for antiarrhythmic options. Seen by Dr. [name] who recommended amiodarone 200mg  daily. INR 1.18. Discussed with pharmacist. Will give 7.5mg  tonight and 5mg  tomorrow and recheck INR on Friday.   2. HTN - Stable on current regimen   3.  H/o Bicuspid Aortic Valve w/ Aortic Insuffiencey and Dilated Aortic Root: s/p recent Bentall Procedure with tissue valve, per Dr. Prescott Gum 07/02/17   The patient has been seen by Dr. Oval Linsey today and deemed ready for discharge home. All follow-up appointments have been scheduled. Discharge medications are listed below.  _____________   Discharge Vitals Blood pressure 132/89, pulse 74, temperature 98.1 F (36.7 C), temperature source Oral, resp. rate 15, height 5\' 10"  (1.778 m), weight 249  lb 12.8 oz (113.3 kg), SpO2 99 %.  Filed Weights   07/29/17 0607  Weight: 249 lb 12.8 oz (113.3 kg)    Labs & Radiologic Studies     CBC  Recent Labs  07/27/17 1158 07/28/17 0800  WBC 9.0 8.9  HGB 12.1* 12.1*  HCT 36.3* 38.4*  MCV 86 89.1  PLT 287 119   Basic Metabolic Panel  Recent Labs  07/28/17 0800 07/29/17 0341  NA 138 140  K 4.4 4.1  CL 104 105  CO2 24 24  GLUCOSE 127* 108*  BUN 12 11  CREATININE 1.05 0.84    CALCIUM 9.3 9.3  MG  --  1.7   Thyroid Function Tests  Recent Labs  07/28/17 1152  TSH 1.910    Dg Chest 1 View  Result Date: 07/15/2017 CLINICAL DATA:  Aortic valve replacement 07/02/2017 EXAM: CHEST 1 VIEW COMPARISON:  07/12/2017 FINDINGS: Cardiac enlargement without heart failure. Small right apical pneumothorax slightly improved from the prior study. Small right effusion. Improved aeration in the lung bases. Subcutaneous emphysema on the right has improved IMPRESSION: Right apical pneumothorax improved now approximately 14 mm. Decreased subcutaneous emphysema on the right. Small right effusion remains. Electronically Signed   By: Franchot Gallo M.D.   On: 07/15/2017 14:53   Dg Chest 1 View  Result Date: 07/02/2017 CLINICAL DATA:  S/P Bentall procedure. Hx of asthma, HTN, and heart cath. Pt is a former smoker. EXAM: CHEST 1 VIEW COMPARISON:  06/30/2017 FINDINGS: Endotracheal tube is in place, tip estimated to be 4.2 cm above the carina. Endoscopy tube is in place, tip overlying the level of the mid esophagus. Status post median sternotomy and aortic valve replacement. The heart is enlarged. There is dense opacity at the left lung base compatible with atelectasis. There is increased mediastinal width, consistent with postoperative changes after graft placement. No pneumothorax or pulmonary edema. IMPRESSION: Postoperative changes. Left lower lobe atelectasis. Electronically Signed   By: Nolon Nations M.D.   On: 07/02/2017 17:08   Dg Chest 2 View  Result Date: 07/12/2017 CLINICAL DATA:  Follow-up pneumothorax. EXAM: CHEST  2 VIEW COMPARISON:  July 10, 2017 FINDINGS: The left-sided pneumothorax persists. It is a little smaller in the interval measuring 2.3 cm at the apex today versus 3.2 cm previously. No left-sided pneumothorax. Cardiomegaly. Stable hila and mediastinum. No focal infiltrate in the lungs. Air in the subcutaneous tissues of the right chest wall and base of neck  persists. IMPRESSION: 1. Persistent small right-sided pneumothorax, a little smaller in the interval. Persistent air in the subcutaneous tissues of the right chest wall. No other changes. Electronically Signed   By: Dorise Bullion III M.D   On: 07/12/2017 07:45   Dg Chest 2 View  Result Date: 07/10/2017 CLINICAL DATA:  Status post aortic valve replacement. Follow-up pneumothorax. EXAM: CHEST  2 VIEW COMPARISON:  July 08, 2017 FINDINGS: The right-sided pneumothorax persists measuring 2.4 cm at the apex today versus 1.1 cm previously. The small pneumothorax accounting for approximately 10-15% of the thorax today has increased in the interval. A left PICC line terminates in the central SVC. No left-sided pneumothorax. Subcutaneous air in the right chest wall has decreased but persists. Skin staples remain on the right. Cardiomegaly. The hila and mediastinum are unchanged. IMPRESSION: 1. Small right pneumothorax accounting for 10-15% of the thorax. This is larger in the interval accounting for 5-10% previously. Recommend clinical correlation and attention on follow-up. 2. Stable left PICC line. 3. Persistent subcutaneous air  over the right chest wall, improved in the interval. These results will be called to the ordering clinician or representative by the Radiologist Assistant, and communication documented in the PACS or zVision Dashboard. Electronically Signed   By: Dorise Bullion III M.D   On: 07/10/2017 08:10   Dg Chest 2 View  Result Date: 06/30/2017 CLINICAL DATA:  Preoperative chest x-ray. Aortic valve insufficiency. Aortic aneurysm . EXAM: CHEST  2 VIEW COMPARISON:  CT 05/12/2017.  Chest x-ray scratch 10/21/2016 . FINDINGS: Mediastinum hilar structures are normal. Cardiomegaly with normal pulmonary vascularity. IMPRESSION: 1.  Low lung volumes with mild basilar atelectasis. 2.  Cardiomegaly with normal pulmonary vascularity. Electronically Signed   By: [place]   On: 06/30/2017 14:15   Dg  Chest Portable 1 View  Result Date: 07/28/2017 CLINICAL DATA:  Shortness of breath. EXAM: PORTABLE CHEST 1 VIEW COMPARISON:  07/15/2017 . FINDINGS: Prior cardiac valve replaced scratched it prior median sternotomy and cardiac valve replacement. Cardiomegaly with normal pulmonary vascularity. No focal infiltrate. No pleural effusion. Interim resolution of right-sided pneumothorax. Interim improvement right chest wall subcutaneous emphysema. Surgical clips right upper chest. IMPRESSION: 1. Interim resolution of right-sided pneumothorax. Improvement of right chest wall subcutaneous emphysema. 2. Prior median sternotomy and cardiac valve replacement. Stable cardiomegaly. No pulmonary venous congestion or CHF. Electronically Signed   By: Marcello Moores  Register   On: 07/28/2017 08:34   Dg Chest Port 1 View  Result Date: 07/08/2017 CLINICAL DATA:  Chest tube EXAM: PORTABLE CHEST 1 VIEW COMPARISON:  07/07/2017 FINDINGS: Left PICC line remains in place, unchanged. Diffuse subcutaneous emphysema again noted, unchanged. Small right apical pneumothorax is increased slightly since prior study, 5-10% currently. Cardiomegaly. Prior feeding sternotomy and valve replacement. No confluent airspace opacities. IMPRESSION: Slight increased size of the small right apical pneumothorax, 5-10%. Stable diffuse subcutaneous emphysema. These results will be called to the ordering clinician or representative by the Radiologist Assistant, and communication documented in the PACS or zVision Dashboard. Electronically Signed   By: Rolm Baptise M.D.   On: 07/08/2017 08:46   Dg Chest Port 1 View  Result Date: 07/07/2017 CLINICAL DATA:  Shortness of Breath EXAM: PORTABLE CHEST 1 VIEW COMPARISON:  07/06/2017 FINDINGS: Cardiac shadow remains enlarged. Postsurgical changes are again seen. Mediastinal drain is been removed in the interval. Left-sided PICC line is noted in satisfactory position. Considerable subcutaneous emphysema is noted. A tiny  right apical pneumothorax is noted. This was not well appreciated on the recent exam. No focal infiltrative changes are seen. IMPRESSION: Tiny right apical pneumothorax with stable significant subcutaneous emphysema. No other focal new abnormality is seen. Critical Value/emergent results were called by telephone at the time of interpretation on 07/07/2017 at 7:53 am to [facility], the patients nurse who verbally acknowledged these results. Electronically Signed   By: Inez Catalina M.D.   On: 07/07/2017 07:53   Dg Chest Port 1 View  Result Date: 07/06/2017 CLINICAL DATA:  Status post aortic valve replacement. Shortness of breath EXAM: PORTABLE CHEST 1 VIEW COMPARISON:  07/05/2017 FINDINGS: Worsening diffuse subcutaneous emphysema throughout the chest wall bilaterally extending into the visualized lower neck. No visible significant pneumothorax. Very low lung volumes with bibasilar atelectasis. Cardiomegaly with vascular congestion. IMPRESSION: Worsening diffuse subcutaneous emphysema concerning for air leak. No visible pneumothorax. Cardiomegaly with vascular congestion. Low volumes with bibasilar atelectasis. Electronically Signed   By: Rolm Baptise M.D.   On: 07/06/2017 07:38   Dg Chest Port 1 View  Result Date: 07/05/2017 CLINICAL DATA:  Followup  aortic valve replacement EXAM: PORTABLE CHEST 1 VIEW COMPARISON:  07/04/2017 FINDINGS: Lower aspect of the chest is not included. Right internal jugular venous access sheath remains in place. Cardiomegaly again demonstrated. Visualize lungs are well-aerated except for mild basilar atelectasis. No edema. IMPRESSION: Cardiomegaly and mild basilar atelectasis.  Lung bases not included. Electronically Signed   By: Nelson Chimes M.D.   On: 07/05/2017 07:33   Dg Chest Port 1 View  Result Date: 07/04/2017 CLINICAL DATA:  Chest tube placement EXAM: PORTABLE CHEST 1 VIEW COMPARISON:  Chest radiograph 07/04/2017 FINDINGS: Unchanged position of right internal jugular vein  catheter/sheath. Unchanged cardiomegaly with pulmonary vascular congestion. No chest tube is visualized. IMPRESSION: 1. No chest tube is visualized. 2. Unchanged hypoinflation, cardiomegaly and pulmonary vascular congestion. Electronically Signed   By: Ulyses Jarred M.D.   On: 07/04/2017 15:51   Dg Chest Port 1 View  Result Date: 07/04/2017 CLINICAL DATA:  Followup aortic valve replacement. EXAM: PORTABLE CHEST 1 VIEW COMPARISON:  07/03/2017 FINDINGS: Swan-Ganz catheter is been removed. Venous access sheath remains in place. Mediastinal drain remains in place. Cardiomegaly persists. Persistent mild basilar atelectasis with a small amount of pleural fluid on the left. IMPRESSION: Swan-Ganz catheter removed. Persistent basilar atelectasis and small left effusion. Electronically Signed   By: Nelson Chimes M.D.   On: 07/04/2017 07:41   Dg Chest Port 1 View  Result Date: 07/03/2017 CLINICAL DATA:  Post aortic arch reconstruction, sore chest EXAM: PORTABLE CHEST 1 VIEW COMPARISON:  Portable exam 0645 hours compared to 07/02/2017 FINDINGS: Interval removal of endotracheal nasogastric tubes. RIGHT jugular Swan-Ganz catheter with tip projecting over proximal RIGHT pulmonary artery partially withdrawn since prior exam. Mediastinal drain persists. Enlargement of cardiac silhouette post median sternotomy and AVR. Mediastinal contours and pulmonary vascularity normal for postoperative patient. Bibasilar atelectasis without infiltrate, pleural effusion or pneumothorax. IMPRESSION: Mild bibasilar atelectasis. Enlargement of cardiac silhouette post AVR. Electronically Signed   By: Lavonia Dana M.D.   On: 07/03/2017 07:30   Dg Chest Port 1 View  Result Date: 07/02/2017 CLINICAL DATA:  Patient status post aortic arch surgery. EXAM: PORTABLE CHEST 1 VIEW COMPARISON:  Chest radiograph 07/02/2017 FINDINGS: Right IJ approach PA catheter tip projects distally in the right lower lobe pulmonary artery. ETT terminates in the mid  trachea. Enteric tube courses inferior to the diaphragm. Mediastinal drain. Stable enlarged cardiac and mediastinal contours. Low lung volumes. Basilar heterogeneous opacities. Small left pleural effusion. Surgical staple line overlies the right hemithorax. IMPRESSION: Right IJ approach PA catheter tip projects distally within the right pulmonary artery, consider repositioning as clinically indicated. ETT terminates mid trachea. Low lung volumes with basilar atelectasis and small left pleural effusion. Electronically Signed   By: Lovey Newcomer M.D.   On: 07/02/2017 18:17    Disposition   Pt is being discharged home today in good condition.  Follow-up Plans & Appointments    Follow-up Information    [facility], [place], [place]. Go on 08/17/2017.   Specialty:  Cardiology Why:  @9 :30 am for pospital follow up Contact information: [address] [place] [address] [phone]        [facility]. Schedule an appointment as soon as possible for a visit on 07/31/2017.   Specialty:  Cardiology Why:  for INR check  Contact information: [address], [place]       [facility] Follow up.   Specialty:  Cardiology Contact information: [phone]  [address], [place] [id number]         Discharge Instructions    Diet - low sodium heart healthy    Complete by:  As directed    Discharge instructions    Complete by:  As directed    Taken total of 7.5mg  of warfarin tonight and 5mg  tomorrow. Call office to check INR on Friday   Increase activity slowly    Complete by:  As directed       Discharge Medications   Current Discharge Medication List    START taking these medications   Details  amiodarone (PACERONE) 200 MG tablet Take 1 tablet (200 mg total) by mouth daily. Qty: 30 tablet, Refills: 3    metoprolol tartrate (LOPRESSOR) 50 MG tablet  Take 1 tablet (50 mg total) by mouth 2 (two) times daily. Qty: 60 tablet, Refills: 3      CONTINUE these medications which have CHANGED   Details  warfarin (COUMADIN) 2.5 MG tablet Take 1 tablet (2.5 mg total) by mouth daily at 6 PM. Take 7.5mg  (3 tablet tonight) and 5mg  tomorrow and INR check Friday Qty: 5 tablet, Refills: 0      CONTINUE these medications which have NOT CHANGED   Details  acetaminophen (TYLENOL) 500 MG tablet Take 500 mg by mouth every 6 (six) hours as needed for moderate pain.    albuterol (VENTOLIN HFA) 108 (90 Base) MCG/ACT inhaler INHALE 2 PUFFS INTO LUNGS EVERY 6 HOURS AS NEEDED FOR WHEEZING OR SHORTNESS OF BREATH Qty: 18 g, Refills: 12    aspirin 81 MG EC tablet Take 1 tablet (81 mg total) by mouth daily.    azelastine (ASTELIN) 137 MCG/SPRAY nasal spray Place 1-2 sprays into the nose 2 (two) times daily as needed for rhinitis or allergies.     benzonatate (TESSALON) 200 MG capsule Take 1 capsule (200 mg total) by mouth every 6 (six) hours as needed for cough. Qty: 30 capsule, Refills: 0    budesonide-formoterol (SYMBICORT) 160-4.5 MCG/ACT inhaler INHALE 2 PUFF INTO THE LUNGS 2 TIMES A DAY. Qty: 10.2 g, Refills: 12    Cholecalciferol (VITAMIN D-3) 5000 UNITS TABS Take 5,000 Units by mouth daily with breakfast.     COLCRYS 0.6 MG tablet Take 0.6 mg by mouth daily. With breakfast    Cyanocobalamin (VITAMIN B12 SL) Place 1,200 mcg under the tongue daily. 1 dropper    DHEA 50 MG TABS Take 50 mg by mouth daily with breakfast.     Diphenhyd-Hydrocort-Nystatin (FIRST-DUKES MOUTHWASH) SUSP 10 ml's by mouth 4 times daily as needed for mouth pain Qty: 240 mL, Refills: 1    fexofenadine (ALLEGRA) 180 MG tablet Take 180 mg by mouth daily with breakfast.     finasteride (PROSCAR) 5 MG tablet Take 5 mg by mouth at bedtime.     levothyroxine (SYNTHROID, LEVOTHROID) 112 MCG tablet Take 112 mcg by mouth daily before breakfast.     liothyronine (CYTOMEL) 25 MCG  tablet Take 25 mcg by mouth every morning.     magic mouthwash w/lidocaine SOLN Take 10 mLs by mouth 4 (four) times daily as needed for mouth pain. Qty: 240 mL, Refills: 1    Menthol, Topical Analgesic, (BLUE-EMU MAXIMUM STRENGTH EX) Apply 1 application topically 4 (four) times daily as needed (for arthritis pain.).    milk thistle 175 MG tablet Take 175 mg by mouth daily.    Multiple Vitamins-Minerals (ONE-A-DAY MENS HEALTH FORMULA PO) Take 1 tablet by mouth  daily with breakfast.     omeprazole (PRILOSEC) 40 MG capsule Take 1 capsule (40 mg total) by mouth 2 (two) times daily. Qty: 180 capsule, Refills: 1    Probiotic Product (ALIGN) 4 MG CAPS Take 4 mg by mouth daily with breakfast.     tamsulosin (FLOMAX) 0.4 MG CAPS capsule Take 0.4 mg by mouth daily after supper.    tetrahydrozoline-zinc (VISINE-AC) 0.05-0.25 % ophthalmic solution Place 1 drop into both eyes as needed (dry eyes).    venlafaxine (EFFEXOR-XR) 150 MG 24 hr capsule Take 150 mg by mouth every morning.     dextromethorphan (DELSYM) 30 MG/5ML liquid Take 30 mg by mouth at bedtime as needed for cough.     Hypromellose (ALZAIR ALLERGY NASAL SPRAY NA) Place 1 spray into both nostrils daily as needed (for allergies).     perphenazine (TRILAFON) 2 MG tablet Take 2 mg by mouth 2 (two) times daily as needed (for migraines).     testosterone cypionate (DEPOTESTOTERONE CYPIONATE) 100 MG/ML injection Inject 200 mg into the muscle every Thursday. For IM use only       STOP taking these medications     carvedilol (COREG) 12.5 MG tablet          Outstanding Labs/Studies   None  Duration of Discharge Encounter   Greater than 30 minutes including physician time.  Signed, Bernetta Sutley PA-C 07/29/2017, 3:49 PM

## 2017-07-29 NOTE — Progress Notes (Signed)
Cardiology Consultation:   Patient ID: Grant [name]; [id number]; 1951/01/24   Admit date: 07/28/2017 Date of Consult: 07/29/2017  Primary Care Provider: Shirline Frees, MD Primary Cardiologist: Dr. [name]    Patient Profile:   Grant [name] is a 66 y.o. male with a hx of COPD, PAFib, anxiety/depression, fibromyalgia, the patient's wife reports very severe chronic fatigue syndrome, states that sometimes a day out of the house or a couple hours in a crowd he will end up in bed for 2 days, OSA w/CPAP reported very compliant, hypothyroidism, HTN, and AS w/bicuspid AV most recently undergoing AVR/AO root replacement with Bentall procedure under hypothermic circulatory arrest with [name].  This was done with a 25 mm Pericardial valve with a 28 mm Valsalva graft on 07/02/17, who is being seen today for the evaluation of AFib and input for AAD tx at the request of Dr. Oval Linsey.  History of Present Illness:   Mr. [name] carries known hx of Paroxysmal AFib, prior to OR was on xarelto for a/c his post operative course was complicated with agitation, delirium, noncompliance, as well as pneumothorax w/SQ emphysema.  He was treated with IV amiodarone and transitioned to warfarin for a/c. Discharged on amio 200mg  BID.  I don't see historically any AAD.  He was see in our clinic 07/27/17 for cardiology follow up, was in SR making slow progress had just recently been resumed on his testosterone/management for his chronic fatigue, was no longer on amiodarone apparently 2/2 to delirium by H&P.  HPI and PMHx is obtained from the chart and largely from the patient's wife who the patient defers to , given hx of severe chronic fatigue, she states that he is a poor historian, unable to pay attention well all of the time and poor memory  The patient reports that he had generally been making slow but steady progress with PT, until the night prior to discharge felt unusually winded even with his CPAP,  and when he got up to use the BR in the middle of the night felt weak, and twice very fleetingly things went black, no full syncope or falls.  No overt sensation of palpitations, no CP.  In lengthy discussion with the patient's wife, he was on both Tramadol and amiodarone (it seems 200mg  BID) both were stopped with in a day or so of each other both secondary to lethargy and more then usual fatigue, and some degree of delirium at home.  He was admitted to [facility] yesterday with SOB, weakness, and near syncope, found in AFib with RVR 120's, he was started on diltiazem gtt for rate control, his INR 1.12.  Overnight he spontaneously converted to SR, remains in SR this AM 70's.  LABS K+ 4.1 BUN/Creat 11/0.84 WBC 8.9 H/H 12/38 pkts 235 TSH 1.910  Past Medical History:  Diagnosis Date  . Allergic rhinitis   . Anxiety   . Arthritis   . Asthma   . Bladder tumor   . Chronic fatigue   . Chronic fatigue   . Depression    06/30/17 Pt denies being depressed, reports Effexor is taken for Chronic Fatigue   . Dyspnea   . Enlarged prostate   . Fibromyalgia   . GERD (gastroesophageal reflux disease)   . Headache   . History of chronic bronchitis   . History of migraine   . History of toxic encephalopathy   . Hypothyroidism   . OSA on CPAP    CPAP 14  . PAF (paroxysmal atrial  fibrillation) ([place]) CARDIOLOGIST -- DR [name]   DX OCT 2013  . 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 Unspecified essential hypertension   . Urethral tumor    PROSTATIC    Past Surgical History:  Procedure Laterality Date  . BENTALL PROCEDURE N/A 07/02/2017   Procedure: BENTALL PROCEDURE;  Surgeon: Ivin Poot, MD;  Location: [facility];  Service: Open Heart Surgery;  Laterality: N/A;  WITH CIRC ARREST  . COLONOSCOPY    . CYSTOSCOPY W/ RETROGRADES Bilateral 06/15/2013   Procedure: CYSTOSCOPY WITH BILATERAL RETROGRADE PYELOGRAM  BLADDER BIOPSY, PROSTATIC URETHRAL BIOPSY, ;  Surgeon: Molli Hazard, MD;  Location: [facility];  Service: Urology;  Laterality: Bilateral;  . LAPAROSCOPIC CHOLECYSTECTOMY  01-14-2001  . RIGHT/LEFT HEART CATH AND CORONARY ANGIOGRAPHY N/A 06/12/2017   Procedure: RIGHT/LEFT HEART CATH AND CORONARY ANGIOGRAPHY;  Surgeon: Larey Dresser, MD;  Location: [place] [facility];  Service: Cardiovascular;  Laterality: N/A;  . TEE WITHOUT CARDIOVERSION N/A 07/02/2017   Procedure: TRANSESOPHAGEAL ECHOCARDIOGRAM (TEE);  Surgeon: Prescott Gum, Collier Salina, MD;  Location: [place];  Service: Open Heart Surgery;  Laterality: N/A;  . UMBILICAL HERNIA REPAIR  01-14-2008       Inpatient Medications: Scheduled Meds: . aspirin EC  81 mg Oral Daily  . colchicine  0.6 mg Oral Daily  . diltiazem  120 mg Oral Daily  . feeding supplement (ENSURE ENLIVE)  237 mL Oral BID BM  . finasteride  5 mg Oral QHS  . levothyroxine  112 mcg Oral QAC breakfast  . liothyronine  25 mcg Oral q morning - 10a  . metoprolol tartrate  25 mg Oral BID  . mometasone-formoterol  2 puff Inhalation BID  . pantoprazole  40 mg Oral Daily  . sodium chloride flush  3 mL Intravenous Q12H  . tamsulosin  0.4 mg Oral QPC supper  . Warfarin - Pharmacist Dosing Inpatient   Does not apply q1800   Continuous Infusions: . sodium chloride    . sodium chloride     PRN Meds: acetaminophen, albuterol, benzonatate, sodium chloride flush  Allergies:    Allergies  Allergen Reactions  . Codeine Itching  . Tramadol     dellusion     Social History:   Social History   Social History  . Marital status: Married    Spouse name: N/A  . Number of children: N/A  . Years of education: N/A   Occupational History  . peanut selling     owner of buisness   Social History Main Topics  . Smoking status: Former Smoker    Packs/day: 0.50    Years: 27.00    Types: Cigarettes    Quit date: 10/21/1979  . Smokeless tobacco: Never Used  . Alcohol use 12.6 oz/week    21 Cans of beer per week     Comment: 4-5 beers a day  . Drug use: No  .  Sexual activity: Not on file   Other Topics Concern  . Not on file   Social History Narrative  . No narrative on file    Family History:   Family History  Problem Relation Age of Onset  . Aortic aneurysm Mother 15       cause of death  . Other Father        motor vehicle accident  . Heart disease Unknown        family history     ROS:  Please see the history of present illness.  ROS  All other ROS reviewed and negative.     Physical Exam/Data:   Vitals:   07/29/17 0305 07/29/17 0505 07/29/17 0607 07/29/17 0649  BP: 130/71 117/79 108/83 124/88  Pulse:   72   Resp:   15   Temp:   97.9 F (36.6 C)   TempSrc:   Oral   SpO2: 94%  95%   Weight:   249 lb 12.8 oz (113.3 kg)   Height:        Intake/Output Summary (Last 24 hours) at 07/29/17 0911 Last data filed at 07/29/17 0300  Gross per 24 hour  Intake           317.75 ml  Output              800 ml  Net          -482.25 ml   Filed Weights   07/29/17 0607  Weight: 249 lb 12.8 oz (113.3 kg)   Body mass index is 35.84 kg/m.  General:  Well nourished, well developed, in no acute distress HEENT: normal Lymph: no adenopathy Neck: no JVD Endocrine:  No thryomegaly Vascular: No carotid bruits Cardiac: RRR; soft SM, no gallops or rubs Lungs:  CTA b/l, no wheezing, rhonchi or rales  Abd: soft, nontender, no hepatomegaly  Ext: no edema Musculoskeletal:  No deformities, BUE and BLE strength normal and equal Skin: warm and dry  Neuro:  CNs 2-12 intact, no focal abnormalities noted Psych:  Normal affect   EKG:  The EKG was personally reviewed and demonstrates:   Today SR 69bpm, borderline1st degree AVBlock, PR 247ms, QRS 130ms, QT by my measurement is 471ms/QTc 4108ms 07/28/17 AFibm 15ms 07/27/17 SR 97bpm, PR 112ms, QRS 168ms, QT by my measurement is 451ms QTc 509 Telemetry:  Telemetry was personally reviewed and demonstrates:   AFib 130-140's >> SR 70's-80's currently  Relevant CV Studies:  TEE Result status:  Final result 07/02/2017   Left ventricle: Normal wall thickness, left ventricular diastolic function and left atrial pressure. Cavity is mildly dilated. LV systolic function is normal with an EF of 55-60%. No thrombus present. No mass present.  Aortic valve: The valve is possible bicuspid. Mild valve calcification present. No stenosis. Mild to moderate regurgitation.  Aorta: The aortic root is dilated at the sinuses of Valsalva. The ascending aorta is dilated.  Mitral valve: Trace regurgitation. Systolic anterior motion of the chordal apparatus is present.  Right ventricle: Normal cavity size, wall thickness and ejection fraction.  Tricuspid valve: Trace regurgitation. The tricuspid valve regurgitation jet is central.  Aorta: Graft present in the ascending aorta.   RIGHT/LEFT HEART CATH AND CORONARY ANGIOGRAPHY 05/2017  Conclusion   1. Normal filling pressures, no pulmonary hypertension.  2. Preserved cardiac output.  3. 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 No significant coronary disease.      Laboratory Data:  Chemistry Recent Labs Lab 07/27/17 1158 07/28/17 0800 07/29/17 0341  NA 138 138 140  K 4.4 4.4 4.1  CL 102 104 105  CO2 22 24 24   GLUCOSE 131* 127* 108*  BUN 11 12 11   CREATININE 0.94 1.05 0.84  CALCIUM 9.4 9.3 9.3  GFRNONAA 85 >60 >60  GFRAA 99 >60 >60  ANIONGAP  --  10 11    No results for input(s): PROT, ALBUMIN, AST, ALT, ALKPHOS, BILITOT in the last 168 hours. Hematology Recent Labs Lab 07/27/17 1158 07/28/17 0800  WBC 9.0 8.9  RBC 4.24 4.31  HGB 12.1* 12.1*  HCT 36.3* 38.4*  MCV 86 89.1  MCH 28.5 28.1  MCHC 33.3 31.5  RDW 14.4 13.4  PLT 287 235   Cardiac EnzymesNo results for input(s): TROPONINI in the last 168 hours.  Recent Labs Lab 07/28/17 0824 07/28/17 1156  TROPIPOC 0.04 0.04    BNPNo results for input(s): BNP, PROBNP in the last 168 hours.  DDimer No results for input(s): DDIMER in the  last 168 hours.  Radiology/Studies:  Dg Chest Portable 1 View Result Date: 07/28/2017 CLINICAL DATA:  Shortness of breath. EXAM: PORTABLE CHEST 1 VIEW COMPARISON:  07/15/2017 . FINDINGS: Prior cardiac valve replaced scratched it prior median sternotomy and cardiac valve replacement. Cardiomegaly with normal pulmonary vascularity. No focal infiltrate. No pleural effusion. Interim resolution of right-sided pneumothorax. Interim improvement right chest wall subcutaneous emphysema. Surgical clips right upper chest. IMPRESSION: 1. Interim resolution of right-sided pneumothorax. Improvement of right chest wall subcutaneous emphysema. 2. Prior median sternotomy and cardiac valve replacement. Stable cardiomegaly. No pulmonary venous congestion or CHF. Electronically Signed   By: Marcello Moores  Register   On: 07/28/2017 08:34    Assessment and Plan:   1. Paroxysmal AFib     CHA2DS2Vasc is 1, will be 2 for age next year, on warfarin (subtherapeurtic, pharmacy managing inpatient)     On dilt gtt and metoprolol  In review of the record, seems there was post op delirium prior to [facility], though in f/u noted family concerned he was "stoned" I am unable to find note stopping th amiodarone, though reportedly with concerns was contributing to lethargy/neuro side effects.  I think his QT is long for Tikosyn, sotalol,  he had no obstructive CAD, though significant VHD, doubt Flecainide is an option. He was on 200mg  BID of amiodarone, will d/w Dr. [name], might try 200mg  daily of the amiodarone without the narcotic and see if he tolerates this.  The patient's wife is leary of this idea. Titrate BB to BP tolerance to try and avoid rapid rates when he has AF       For questions or updates, please contact [place] Please consult [email or web address] for contact info under Cardiology/STEMI.   Signed, Baldwin Jamaica, PA-C  07/29/2017 9:11 AM  EP Attending  Patient seen and examined. Agree with above. The patient has  returned to NSR. He is s/p AVR and aortic root repair with bicuspid AV. It is unlikely but not impossible that amiodarone resulted in neurological symptoms. I have recommended a retrial of 200 mg daily. Continue home meds. Avoid all narcotics including tramadol. Would stop amiodarone 3 months after heart surgery and then watchful waiting. I discussed all of the above with the patient and his wife and they agree.   Mikle Bosworth.D.

## 2017-07-30 NOTE — Telephone Encounter (Signed)
CY Please advise if okay to refill medication. Thanks

## 2017-07-30 NOTE — Telephone Encounter (Signed)
Ok to refill 

## 2017-07-31 ENCOUNTER — Ambulatory Visit (INDEPENDENT_AMBULATORY_CARE_PROVIDER_SITE_OTHER): Payer: Medicare Other | Admitting: Pharmacist

## 2017-07-31 DIAGNOSIS — I480 Paroxysmal atrial fibrillation: Secondary | ICD-10-CM

## 2017-07-31 DIAGNOSIS — Z5181 Encounter for therapeutic drug level monitoring: Secondary | ICD-10-CM

## 2017-07-31 DIAGNOSIS — Z952 Presence of prosthetic heart valve: Secondary | ICD-10-CM

## 2017-07-31 LAB — POCT INR: INR: 1.4

## 2017-08-05 ENCOUNTER — Other Ambulatory Visit: Payer: Self-pay | Admitting: *Deleted

## 2017-08-05 ENCOUNTER — Ambulatory Visit
Admission: RE | Admit: 2017-08-05 | Discharge: 2017-08-05 | Disposition: A | Discharge status: Home / Self Care | Payer: Medicare Other | Source: Ambulatory Visit | Attending: Cardiothoracic Surgery | Admitting: Cardiothoracic Surgery

## 2017-08-05 ENCOUNTER — Encounter: Payer: Self-pay | Admitting: Cardiothoracic Surgery

## 2017-08-05 ENCOUNTER — Ambulatory Visit (INDEPENDENT_AMBULATORY_CARE_PROVIDER_SITE_OTHER): Payer: Self-pay | Admitting: Cardiothoracic Surgery

## 2017-08-05 VITALS — BP 130/87 | HR 68 | Resp 16 | Ht 70.0 in | Wt 255.0 lb

## 2017-08-05 DIAGNOSIS — I719 Aortic aneurysm of unspecified site, without rupture: Secondary | ICD-10-CM

## 2017-08-05 DIAGNOSIS — Z952 Presence of prosthetic heart valve: Secondary | ICD-10-CM

## 2017-08-05 DIAGNOSIS — Z9889 Other specified postprocedural states: Secondary | ICD-10-CM

## 2017-08-05 DIAGNOSIS — Z95828 Presence of other vascular implants and grafts: Secondary | ICD-10-CM

## 2017-08-05 DIAGNOSIS — I7121 Aneurysm of the ascending aorta, without rupture: Secondary | ICD-10-CM

## 2017-08-05 DIAGNOSIS — I716 Thoracoabdominal aortic aneurysm, without rupture: Secondary | ICD-10-CM | POA: Diagnosis not present

## 2017-08-05 NOTE — Progress Notes (Signed)
PCP is Patient, No Pcp Per Referring Provider is Josue Hector, MD  Chief Complaint  Patient presents with  . 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 He had difficulty with atrial fibrillation perioperatively and was discharged home on Coumadin in sinus rhythm. He had difficulty with delirium, insomnia, and potentially self harming -behavior requiring restraints.  Since returning home has problems with insomnia and altered mental status have improved. He is walking up to 1/4 mile per day. He has had no falls. He was readmitted to the hospital with rapid atrial fibrillation which converted to sinus rhythm with IV diltiazem and he is currently on amiodarone 200 mg by mouth daily. His preop LVEF was normal.  The patient has chronic fatigue syndrome and is on long-term testosterone injections which have resumed and have improved his exercise tolerance. The patient has not been able to drive for several years.  Past Medical History:  Diagnosis Date  . Allergic rhinitis   . Anxiety   . Arthritis   . Asthma   . Bladder tumor   . Chronic fatigue   . Chronic fatigue   . Depression    06/30/17 Pt denies being depressed, reports Effexor is taken for Chronic Fatigue   . Dyspnea   . Enlarged prostate   . Fibromyalgia   . GERD (gastroesophageal reflux disease)   . Headache   . History of chronic bronchitis   . History of migraine   . History of toxic encephalopathy   . Hypothyroidism   . OSA on CPAP    CPAP 14  . PAF (paroxysmal atrial fibrillation) ([place]) CARDIOLOGIST -- DR [name]   DX OCT 2013  . 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 Unspecified essential hypertension   . Urethral tumor    PROSTATIC    Past  Surgical History:  Procedure Laterality Date  . BENTALL PROCEDURE N/A 07/02/2017   Procedure: BENTALL PROCEDURE;  Surgeon: Ivin Poot, MD;  Location: [place];  Service: Open Heart Surgery;  Laterality: N/A;  WITH CIRC ARREST  . COLONOSCOPY    . CYSTOSCOPY W/ RETROGRADES Bilateral 06/15/2013   Procedure: CYSTOSCOPY WITH BILATERAL RETROGRADE PYELOGRAM  BLADDER BIOPSY, PROSTATIC URETHRAL BIOPSY, ;  Surgeon: Molli Hazard, MD;  Location: [facility];  Service: Urology;  Laterality: Bilateral;  . LAPAROSCOPIC CHOLECYSTECTOMY  01-14-2001  . RIGHT/LEFT HEART CATH AND CORONARY ANGIOGRAPHY N/A 06/12/2017   Procedure: RIGHT/LEFT HEART CATH AND CORONARY ANGIOGRAPHY;  Surgeon: Larey Dresser, MD;  Location: [place] [facility];  Service: Cardiovascular;  Laterality: N/A;  . TEE WITHOUT CARDIOVERSION N/A 07/02/2017   Procedure: TRANSESOPHAGEAL ECHOCARDIOGRAM (TEE);  Surgeon: Prescott Gum, Collier Salina, MD;  Location: [place];  Service: Open Heart Surgery;  Laterality: N/A;  . UMBILICAL HERNIA REPAIR  01-14-2008    Family History  Problem Relation Age of Onset  . Aortic aneurysm Mother 3       cause of death  . Other Father        motor vehicle accident  . Heart disease Unknown        family history    Social History Social History  Substance Use Topics  . Smoking status: Former Smoker    Packs/day: 0.50  Years: 27.00    Types: Cigarettes    Quit date: 10/21/1979  . Smokeless tobacco: Never Used  . Alcohol use 12.6 oz/week    21 Cans of beer per week     Comment: 4-5 beers a day    Current Outpatient Prescriptions  Medication Sig Dispense Refill  . acetaminophen (TYLENOL) 500 MG tablet Take 500 mg by mouth every 6 (six) hours as needed for moderate pain.    Marland Kitchen albuterol (VENTOLIN HFA) 108 (90 Base) MCG/ACT inhaler INHALE 2 PUFFS INTO LUNGS EVERY 6 HOURS AS NEEDED FOR WHEEZING OR SHORTNESS OF BREATH 18 g 12  . amiodarone (PACERONE) 200 MG tablet Take 1 tablet (200 mg total) by  mouth daily. 30 tablet 3  . aspirin 81 MG EC tablet Take 1 tablet (81 mg total) by mouth daily.    Marland Kitchen azelastine (ASTELIN) 137 MCG/SPRAY nasal spray Place 1-2 sprays into the nose 2 (two) times daily as needed for rhinitis or allergies.     . benzonatate (TESSALON) 200 MG capsule Take 1 capsule (200 mg total) by mouth every 6 (six) hours as needed for cough. 30 capsule 0  . budesonide-formoterol (SYMBICORT) 160-4.5 MCG/ACT inhaler INHALE 2 PUFF INTO THE LUNGS 2 TIMES A DAY. 10.2 g 12  . Cholecalciferol (VITAMIN D-3) 5000 UNITS TABS Take 5,000 Units by mouth daily with breakfast.     . COLCRYS 0.6 MG tablet Take 0.6 mg by mouth daily. With breakfast    . Cyanocobalamin (VITAMIN B12 SL) Place 1,200 mcg under the tongue daily. 1 dropper    . dextromethorphan (DELSYM) 30 MG/5ML liquid Take 30 mg by mouth at bedtime as needed for cough.     Marland Kitchen DHEA 50 MG TABS Take 50 mg by mouth daily with breakfast.     . fexofenadine (ALLEGRA) 180 MG tablet Take 180 mg by mouth daily with breakfast.     . finasteride (PROSCAR) 5 MG tablet Take 5 mg by mouth at bedtime.     . Hypromellose (ALZAIR ALLERGY NASAL SPRAY NA) Place 1 spray into both nostrils daily as needed (for allergies).     Marland Kitchen levothyroxine (SYNTHROID, LEVOTHROID) 112 MCG tablet Take 112 mcg by mouth daily before breakfast.     . liothyronine (CYTOMEL) 25 MCG tablet Take 25 mcg by mouth every morning.     . magic mouthwash w/lidocaine SOLN Take 10 mLs by mouth 4 (four) times daily as needed for mouth pain. 240 mL 1  . Menthol, Topical Analgesic, (BLUE-EMU MAXIMUM STRENGTH EX) Apply 1 application topically 4 (four) times daily as needed (for arthritis pain.).    Marland Kitchen metoprolol tartrate (LOPRESSOR) 50 MG tablet Take 1 tablet (50 mg total) by mouth 2 (two) times daily. 60 tablet 3  . milk thistle 175 MG tablet Take 175 mg by mouth daily.    . Multiple Vitamins-Minerals (ONE-A-DAY MENS HEALTH FORMULA PO) Take 1 tablet by mouth daily with breakfast.     .  omeprazole (PRILOSEC) 40 MG capsule Take 1 capsule (40 mg total) by mouth 2 (two) times daily. 180 capsule 1  . perphenazine (TRILAFON) 2 MG tablet Take 2 mg by mouth 2 (two) times daily as needed (for migraines).     . Probiotic Product (ALIGN) 4 MG CAPS Take 4 mg by mouth daily with breakfast.     . tamsulosin (FLOMAX) 0.4 MG CAPS capsule Take 0.4 mg by mouth daily after supper.    . testosterone cypionate (DEPOTESTOTERONE CYPIONATE) 100 MG/ML injection Inject 200 mg  into the muscle every Thursday. For IM use only     . tetrahydrozoline-zinc (VISINE-AC) 0.05-0.25 % ophthalmic solution Place 1 drop into both eyes as needed (dry eyes).    . venlafaxine (EFFEXOR-XR) 150 MG 24 hr capsule Take 150 mg by mouth every morning.     . warfarin (COUMADIN) 2.5 MG tablet Take 1 tablet (2.5 mg total) by mouth daily at 6 PM. Take 7.5mg  (3 tablet tonight) and 5mg  tomorrow and INR check Friday (Patient taking differently: Take 2.5 mg by mouth daily at 6 PM. Take 7.5mg  (3 tablet tonight) and 5mg  tomorrow and INR check Friday and as directed) 5 tablet 0   No current facility-administered medications for this visit.     Allergies  Allergen Reactions  . Codeine Itching  . Tramadol     dellusion     Review of Systems  Patient's appetite is return. He has lost approximately 20 pounds but was obese preoperatively No fever No bleeding problems from his Coumadin No change in vision No abdominal pain Sternal incision is healed well No peripheral edema  BP 130/87 (BP Location: Right Arm, Patient Position: Sitting, Cuff Size: Large)   Pulse 68   Resp 16   Ht 5\' 10"  (1.778 m)   Wt 255 lb (115.7 kg)   SpO2 98% Comment: ON RA  BMI 36.59 kg/m  Physical Exam      Exam    General- alert and comfortable   Lungs- clear without rales, wheezes   Cor- regular rate and rhythm, no murmur , gallop   Abdomen- soft, non-tender   Extremities - warm, non-tender, minimal edema   Neuro- oriented, appropriate, no  focal weakness   Diagnostic Tests: Chest x-ray today is clear. Sternal wires are intact. No pleural effusion.  Impression: Patient is probably 40-50 percent recovered after biologic Bentall procedure for large ascending aneurysm with moderate AI and a prolonged postoperative hospital recovery due to multiple factors including atrial fibrillation and sequela from his chronic fatigue syndrome  Plan:the patient will continue his current medications. He should not lift more than 10 pounds and should not go to his fitness center until after his November checkup. He should continue to walk 15-20 minuteseach day if possible. He understands importance of heart healthy diet and heart healthy activities to optimize his recovery. Since he cannot drive he will continue his rehabilitation at home. I will have the patient return for office visit in one month to review progress. Potentially at  that time he could be transition from Coumadin to [place] which he was taking before surgery.  Len Childs, MD [facility] [phone]

## 2017-08-07 ENCOUNTER — Ambulatory Visit (INDEPENDENT_AMBULATORY_CARE_PROVIDER_SITE_OTHER): Payer: Medicare Other | Admitting: Internal Medicine

## 2017-08-07 DIAGNOSIS — I480 Paroxysmal atrial fibrillation: Secondary | ICD-10-CM

## 2017-08-07 DIAGNOSIS — Z5181 Encounter for therapeutic drug level monitoring: Secondary | ICD-10-CM | POA: Diagnosis not present

## 2017-08-07 DIAGNOSIS — Z952 Presence of prosthetic heart valve: Secondary | ICD-10-CM

## 2017-08-07 LAB — POCT INR: INR: 1.3

## 2017-08-10 ENCOUNTER — Telehealth: Payer: Self-pay | Admitting: Cardiovascular Disease

## 2017-08-10 NOTE — Telephone Encounter (Signed)
Would continue amiodarone 200mg  daily with food for now

## 2017-08-10 NOTE — Telephone Encounter (Signed)
Call from patient's wife with patient heard in the background.  Mr Mian has complaints of dizziness and intermittant nausea.  Mrs Schoon is concerned amiodarone is causing patient's symptoms. Pt is taking several medications prescribed by various specialists.  Mrs Garno states patient has a PCP who he hasn't seen in "years."  Mrs Rumple denies other symptoms.  Reports the following vital signs Bp 132/88-77., 128/89-75.  Patient is taking his medications with food as directed.  Encouraged Mrs Cornfield to contact patient's PCP, explained having a PCP who helps navigate patient care might help better manage patients care.  Will route note to Dr [name] for input regarding possible causes of dizziness and nausea.  Georgana Curio MHA RN CCM

## 2017-08-11 NOTE — Telephone Encounter (Signed)
Left message for patient to call back  

## 2017-08-11 NOTE — Telephone Encounter (Signed)
Patient's wife called back and stated patient was feeling better today. Informed her of Dr. Kyla Balzarine recommendations. Patient's wife verbalized understanding.

## 2017-08-12 ENCOUNTER — Ambulatory Visit: Payer: Medicare Other | Admitting: Cardiothoracic Surgery

## 2017-08-13 NOTE — Progress Notes (Signed)
Cardiology Office Note    Date:  08/17/2017   ID:  Grant [name], DOB [birth year], MRN [id number]  PCP:  Patient, No Pcp Per  Cardiologist: Dr. [name]  EP: Dr. [name]  Chief Complaint: Hospital follow up for afib RVR  History of Present Illness:   Grant [name] is a 66 y.o. male with paroxysmal atrial fibrillation, hypertension, OSA on CPAPand bicuspid aortic valve s/p Bentall and bioprosthetic (64mm Edwards pericardial [employer]) presents for hospital follow up.   H/o bicuspid aortic valve w/ aortic insuffiencey and dilated aortic root, normal coronaries by cath and normal LVEF s/p recent Bentall Procedure by Dr. Prescott Gum 07/02/17 with pericardial tissue valve. Post operative course was complicated by atrial fibrillation w/ RVR, treated with amiodarone and coumadin. He also had post-operative delirium. He was discharged home on 07/10/17 and was in NSR by time of discharge. He was seen by CT surgery on 07/15/17 and still having issues with delirium, which was felt related to amiodarone. This was discontinued and he was continued on Coreg. Delirium improved after amiodarone discontinued.   He was seen 07/27/17 in clinic for post hospital f/u by Truitt Merle, NP, and was noted to be still in NSR.  Came to ER 07/28/17 with complaints of SOB, near syncope and elevated HR and noted to be back in afib w/ RVR in the 120s. Patient was admitted and started on IV cardizem. Converted to sinus rhythm overnight on IV cardizem. Started on metoprolol 25mg  BID that increased at discharge. Repeat EKG showed QT/QTc 448/480 ms. TSH normal. EP consulted for antiarrhythmic options. Seen by Dr. [name] who recommended amiodarone 200mg  daily.   Per telephone note 08/10/17 -->dizziness and intermittant nausea. Dr. [name] recommended continuation of amiodarone with food.  Here today for follow up.  No further nausea.  He is feeling gradually better.  He walks approximately half a mile a day without chest pain  or shortness of breath.  He denies palpitation, dizziness, lower extremity edema, orthopnea, PND, syncope or melena.  He feels weak after taking his morning pill in about hours, however, his symptoms improved around lunch time.  Past Medical History:  Diagnosis Date  . Allergic rhinitis   . Anxiety   . Arthritis   . Asthma   . Bladder tumor   . Chronic fatigue   . Chronic fatigue   . Depression    06/30/17 Pt denies being depressed, reports Effexor is taken for Chronic Fatigue   . Dyspnea   . Enlarged prostate   . Fibromyalgia   . GERD (gastroesophageal reflux disease)   . Headache   . History of chronic bronchitis   . History of migraine   . History of toxic encephalopathy   . Hypothyroidism   . OSA on CPAP    CPAP 14  . PAF (paroxysmal atrial fibrillation) ([place]) CARDIOLOGIST -- DR [name]   DX OCT 2013  . 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 Unspecified essential hypertension   . Urethral tumor    PROSTATIC    Past Surgical History:  Procedure Laterality Date  . BENTALL PROCEDURE N/A 07/02/2017   Procedure: BENTALL PROCEDURE;  Surgeon: Ivin Poot, MD;  Location: [facility];  Service: Open Heart Surgery;  Laterality: N/A;  WITH CIRC ARREST  . COLONOSCOPY    . CYSTOSCOPY W/ RETROGRADES Bilateral 06/15/2013   Procedure: CYSTOSCOPY WITH BILATERAL RETROGRADE PYELOGRAM  BLADDER BIOPSY, PROSTATIC URETHRAL BIOPSY, ;  Surgeon: Molli Hazard, MD;  Location: [facility];  Service: Urology;  Laterality: Bilateral;  . LAPAROSCOPIC CHOLECYSTECTOMY  01-14-2001  . RIGHT/LEFT HEART CATH AND CORONARY ANGIOGRAPHY N/A 06/12/2017   Procedure: RIGHT/LEFT HEART CATH AND CORONARY ANGIOGRAPHY;  Surgeon: Larey Dresser, MD;  Location: [facility];  Service: Cardiovascular;  Laterality: N/A;  . TEE WITHOUT CARDIOVERSION N/A 07/02/2017   Procedure: TRANSESOPHAGEAL ECHOCARDIOGRAM (TEE);  Surgeon: Prescott Gum, Collier Salina, MD;  Location: [place];  Service: Open Heart Surgery;  Laterality: N/A;    . UMBILICAL HERNIA REPAIR  01-14-2008    Current Medications: Prior to Admission medications   Medication Sig Start Date End Date Taking? Authorizing Provider  acetaminophen (TYLENOL) 500 MG tablet Take 500 mg by mouth every 6 (six) hours as needed for moderate pain.    [provider]  albuterol (VENTOLIN HFA) 108 (90 Base) MCG/ACT inhaler INHALE 2 PUFFS INTO LUNGS EVERY 6 HOURS AS NEEDED FOR WHEEZING OR SHORTNESS OF BREATH 01/19/17   Baird Lyons D, MD  amiodarone (PACERONE) 200 MG tablet Take 1 tablet (200 mg total) by mouth daily. 07/29/17   Leanor Kail, PA  aspirin 81 MG EC tablet Take 1 tablet (81 mg total) by mouth daily. 07/13/17   Gold, Wilder Glade, PA-C  azelastine (ASTELIN) 137 MCG/SPRAY nasal spray Place 1-2 sprays into the nose 2 (two) times daily as needed for rhinitis or allergies.     [provider]  benzonatate (TESSALON) 200 MG capsule Take 1 capsule (200 mg total) by mouth every 6 (six) hours as needed for cough. 11/19/16   Baird Lyons D, MD  budesonide-formoterol (SYMBICORT) 160-4.5 MCG/ACT inhaler INHALE 2 PUFF INTO THE LUNGS 2 TIMES A DAY. 01/19/17   Baird Lyons D, MD  Cholecalciferol (VITAMIN D-3) 5000 UNITS TABS Take 5,000 Units by mouth daily with breakfast.     [provider]  COLCRYS 0.6 MG tablet Take 0.6 mg by mouth daily. With breakfast 01/29/12   [provider]  Cyanocobalamin (VITAMIN B12 SL) Place 1,200 mcg under the tongue daily. 1 dropper    [provider]  dextromethorphan (DELSYM) 30 MG/5ML liquid Take 30 mg by mouth at bedtime as needed for cough.     [provider]  DHEA 50 MG TABS Take 50 mg by mouth daily with breakfast.     [provider]  fexofenadine (ALLEGRA) 180 MG tablet Take 180 mg by mouth daily with breakfast.     [provider]  finasteride (PROSCAR) 5 MG tablet Take 5 mg by mouth at bedtime.  01/14/14   [provider]  Hypromellose Vertell Limber ALLERGY  NASAL SPRAY NA) Place 1 spray into both nostrils daily as needed (for allergies).     [provider]  levothyroxine (SYNTHROID, LEVOTHROID) 112 MCG tablet Take 112 mcg by mouth daily before breakfast.     [provider]  liothyronine (CYTOMEL) 25 MCG tablet Take 25 mcg by mouth every morning.     [provider]  magic mouthwash w/lidocaine SOLN Take 10 mLs by mouth 4 (four) times daily as needed for mouth pain. 07/16/17   Deneise Lever, MD  Menthol, Topical Analgesic, (BLUE-EMU MAXIMUM STRENGTH EX) Apply 1 application topically 4 (four) times daily as needed (for arthritis pain.).    [provider]  metoprolol tartrate (LOPRESSOR) 50 MG tablet Take 1 tablet (50 mg total) by mouth 2 (two) times daily. 07/29/17   Reem Fleury, PA  milk thistle 175 MG tablet Take 175 mg by mouth daily.    [provider]  Multiple Vitamins-Minerals (ONE-A-DAY MENS HEALTH FORMULA PO) Take 1 tablet by mouth daily with breakfast.     [provider]  omeprazole (PRILOSEC) 40 MG capsule Take 1 capsule (40 mg total) by mouth 2 (two) times daily. 10/31/16   Josue Hector, MD  perphenazine (TRILAFON) 2 MG tablet Take 2 mg by mouth 2 (two) times daily as needed (for migraines).     [provider]  Probiotic Product (ALIGN) 4 MG CAPS Take 4 mg by mouth daily with breakfast.     [provider]  tamsulosin (FLOMAX) 0.4 MG CAPS capsule Take 0.4 mg by mouth daily after supper.    [provider]  testosterone cypionate (DEPOTESTOTERONE CYPIONATE) 100 MG/ML injection Inject 200 mg into the muscle every Thursday. For IM use only     [provider]  tetrahydrozoline-zinc (VISINE-AC) 0.05-0.25 % ophthalmic solution Place 1 drop into both eyes as needed (dry eyes).    [provider]  venlafaxine (EFFEXOR-XR) 150 MG 24 hr capsule Take 150 mg by mouth every morning.     [provider]  warfarin (COUMADIN) 2.5 MG  tablet Take 1 tablet (2.5 mg total) by mouth daily at 6 PM. Take 7.5mg  (3 tablet tonight) and 5mg  tomorrow and INR check Friday Patient taking differently: Take 2.5 mg by mouth daily at 6 PM. Take 7.5mg  (3 tablet tonight) and 5mg  tomorrow and INR check Friday and as directed 07/29/17   Leanor Kail, PA    Allergies:   Codeine and Tramadol   Social History   Social History  . Marital status: Married    Spouse name: N/A  . Number of children: N/A  . Years of education: N/A   Occupational History  . peanut selling     owner of buisness   Social History Main Topics  . Smoking status: Former Smoker    Packs/day: 0.50    Years: 27.00    Types: Cigarettes    Quit date: 10/21/1979  . Smokeless tobacco: Never Used  . Alcohol use 12.6 oz/week    21 Cans of beer per week     Comment: 4-5 beers a day  . Drug use: No  . Sexual activity: Not Asked   Other Topics Concern  . None   Social History Narrative  . None     Family History:  The patient's family history includes Aortic aneurysm (age of onset: 51) in his mother; Heart disease in his unknown relative; Other in his father.   ROS:   Please see the history of present illness.    ROS All other systems reviewed and are negative.   PHYSICAL EXAM:   VS:  BP 112/90   Pulse 75   Ht 5\' 10"  (1.778 m)   Wt 261 lb 12.8 oz (118.8 kg)   SpO2 96%   BMI 37.56 kg/m    GEN: Well nourished, well developed, in no acute distress  HEENT: normal  Neck: no JVD, carotid bruits, or masses Cardiac: RRR; no murmurs, rubs, or gallops,no edema  Respiratory:  clear to auscultation bilaterally, normal work of breathing GI: soft, nontender, nondistended, + BS MS: no deformity or atrophy  Skin: warm and dry, no rash Neuro:  Alert and Oriented x 3, Strength and sensation are intact Psych: euthymic mood, full affect  Wt Readings from Last 3 Encounters:  08/17/17 261 lb 12.8 oz (118.8 kg)  08/05/17 255 lb (115.7 kg)  07/29/17 249 lb 12.8 oz  (113.3 kg)  Studies/Labs Reviewed:   EKG:  EKG is ordered today.  The ekg ordered today demonstrates sinus rhythm with T wave inversion in inferior lead and lead V6.  Recent Labs: 06/30/2017: ALT 24 07/28/2017: Hemoglobin 12.1; Platelets 235; TSH 1.910 07/29/2017: BUN 11; Creatinine, Ser 0.84; Magnesium 1.7; Potassium 4.1; Sodium 140   Lipid Panel    Component Value Date/Time   CHOL 184 05/05/2017 0931   TRIG 225 (H) 05/05/2017 0931   HDL 45 05/05/2017 0931   CHOLHDL 4.1 05/05/2017 0931   LDLCALC 94 05/05/2017 0931    Additional studies/ records that were reviewed today include:  As above   ASSESSMENT & PLAN:    1. PAF -  Prior delirium improved after discontinuation of amiodarone. Agian admitted 07/28/17 for A. fib RVR. Seen by Dr. [name] and he commended amiodarone 200 mg daily. Continue Coumadin for anticoagulation.  His symptoms is improving.  Tolerating amiodarone well with food.  2. HTN - Stable on current regimen   3. H/o Bicuspid Aortic Valve w/ Aortic Insuffiencey and Dilated Aortic Root - s/p recent Bentall Procedure with tissue valve by  Dr. Prescott Gum 07/02/17. Echo next month for evaluation.  4. OSA -Compliant with CPAP.    Medication Adjustments/Labs and Tests Ordered: Current medicines are reviewed at length with the patient today.  Concerns regarding medicines are outlined above.  Medication changes, Labs and Tests ordered today are listed in the Patient Instructions below. Patient Instructions  Medication Instructions:  Your physician recommends that you continue on your current medications as directed. Please refer to the Current Medication list given to you today.  -- If you need a refill on your cardiac medications before your next appointment, please call your pharmacy. -  Labwork: None ordered  Testing/Procedures: None ordered  Follow-Up: Keep your scheduled follow up with Dr. [name] on 08/28/2017 @ 10:45 a.m.   Thank you for  choosing [facility]!!             Jarrett Soho, [place]  08/17/2017 9:51 AM    [address], [place], [place]  [address] Phone: [phone]; Fax: [phone]

## 2017-08-14 ENCOUNTER — Ambulatory Visit (INDEPENDENT_AMBULATORY_CARE_PROVIDER_SITE_OTHER): Payer: Medicare Other

## 2017-08-14 DIAGNOSIS — Z5181 Encounter for therapeutic drug level monitoring: Secondary | ICD-10-CM | POA: Diagnosis not present

## 2017-08-14 DIAGNOSIS — I480 Paroxysmal atrial fibrillation: Secondary | ICD-10-CM

## 2017-08-14 DIAGNOSIS — Z952 Presence of prosthetic heart valve: Secondary | ICD-10-CM | POA: Diagnosis not present

## 2017-08-14 LAB — POCT INR: INR: 1.8

## 2017-08-17 ENCOUNTER — Encounter: Payer: Self-pay | Admitting: Physician Assistant

## 2017-08-17 ENCOUNTER — Ambulatory Visit (INDEPENDENT_AMBULATORY_CARE_PROVIDER_SITE_OTHER): Payer: Medicare Other | Admitting: Physician Assistant

## 2017-08-17 VITALS — BP 112/90 | HR 75 | Ht 70.0 in | Wt 261.8 lb

## 2017-08-17 DIAGNOSIS — I10 Essential (primary) hypertension: Secondary | ICD-10-CM

## 2017-08-17 DIAGNOSIS — Z9989 Dependence on other enabling machines and devices: Secondary | ICD-10-CM

## 2017-08-17 DIAGNOSIS — I480 Paroxysmal atrial fibrillation: Secondary | ICD-10-CM

## 2017-08-17 DIAGNOSIS — G4733 Obstructive sleep apnea (adult) (pediatric): Secondary | ICD-10-CM | POA: Diagnosis not present

## 2017-08-17 DIAGNOSIS — Z954 Presence of other heart-valve replacement: Secondary | ICD-10-CM | POA: Diagnosis not present

## 2017-08-17 NOTE — Patient Instructions (Addendum)
Medication Instructions:  Your physician recommends that you continue on your current medications as directed. Please refer to the Current Medication list given to you today.  -- If you need a refill on your cardiac medications before your next appointment, please call your pharmacy. -  Labwork: None ordered  Testing/Procedures: None ordered  Follow-Up: Keep your scheduled follow up with Dr. [name] on 08/28/2017 @ 10:45 a.m.   Thank you for choosing [facility]!!

## 2017-08-19 DIAGNOSIS — G4733 Obstructive sleep apnea (adult) (pediatric): Secondary | ICD-10-CM | POA: Diagnosis not present

## 2017-08-21 ENCOUNTER — Ambulatory Visit (INDEPENDENT_AMBULATORY_CARE_PROVIDER_SITE_OTHER): Payer: Medicare Other | Admitting: Cardiovascular Disease

## 2017-08-21 ENCOUNTER — Other Ambulatory Visit: Payer: Self-pay | Admitting: *Deleted

## 2017-08-21 DIAGNOSIS — Z952 Presence of prosthetic heart valve: Secondary | ICD-10-CM

## 2017-08-21 DIAGNOSIS — I480 Paroxysmal atrial fibrillation: Secondary | ICD-10-CM

## 2017-08-21 DIAGNOSIS — Z5181 Encounter for therapeutic drug level monitoring: Secondary | ICD-10-CM

## 2017-08-21 LAB — POCT INR: INR: 2

## 2017-08-21 MED ORDER — WARFARIN SODIUM 2.5 MG PO TABS: ORAL_TABLET | ORAL | 0 refills | Status: DC

## 2017-08-25 NOTE — Progress Notes (Signed)
Cardiology Office Note    Date:  08/28/2017   ID:  Grant [name], DOB [birth year], MRN [id number]  PCP:  Patient, No Pcp Per  Cardiologist: Dr. [name]  EP: Dr. [name]  Chief Complaint: Hospital follow up for afib RVR  History of Present Illness:   Grant [name] is a 66 y.o. male with paroxysmal atrial fibrillation, hypertension, OSA on CPAPand bicuspid aortic valve s/p Bentall and bioprosthetic (44mm Edwards pericardial [employer]) presents for hospital follow up.   H/o bicuspid aortic valve w/ aortic insuffiencey and dilated aortic root, normal coronaries by cath and normal LVEF s/p  Bentall Procedure by Dr. Prescott Gum 07/02/17 with 28 mm graft and 25 mm edwards pericardial magna ease valve  Post operative course was complicated by atrial fibrillation w/ RVR, treated with amiodarone and coumadin. He also had post-operative delirium. He was discharged home on 07/10/17 and was in NSR by time of discharge. He was seen by CT surgery on 07/15/17 and still having issues with delirium, which was felt related to amiodarone. This was discontinued and he was continued on Coreg. Delirium improved after amiodarone discontinued.   Came to ER 07/28/17 with complaints of SOB, near syncope and elevated HR and noted to be back in afib w/ RVR in the 120s. Patient was admitted and started on IV cardizem. Converted to sinus rhythm overnight on IV cardizem. Started on metoprolol 25mg  BID that increased at discharge. Repeat EKG showed QT/QTc 448/480 ms. TSH normal. EP consulted for antiarrhythmic options. Seen by Dr. [name] who recommended amiodarone 200mg  daily.   Per telephone note 08/10/17 -->dizziness and intermittant nausea. Recommended continuation of amiodarone with food.  Previously on xarelto before surgery and PVT;s note indicates transitioning back to this in November   Told him it's ok to go to [place] had f/u with CVTS next week   Echo done today reviewed and shows low gradients no peri  valvular regurgitation and normal EF   Past Medical History:  Diagnosis Date  . Allergic rhinitis   . Anxiety   . Arthritis   . Asthma   . Bladder tumor   . Chronic fatigue   . Chronic fatigue   . Depression    06/30/17 Pt denies being depressed, reports Effexor is taken for Chronic Fatigue   . Dyspnea   . Enlarged prostate   . Fibromyalgia   . GERD (gastroesophageal reflux disease)   . Headache   . History of chronic bronchitis   . History of migraine   . History of toxic encephalopathy   . Hypothyroidism   . OSA on CPAP    CPAP 14  . PAF (paroxysmal atrial fibrillation) ([place]) CARDIOLOGIST -- DR [name]   DX OCT 2013  . 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 Unspecified essential hypertension   . Urethral tumor    PROSTATIC    Past Surgical History:  Procedure Laterality Date  . COLONOSCOPY    . LAPAROSCOPIC CHOLECYSTECTOMY  01-14-2001  . UMBILICAL HERNIA REPAIR  01-14-2008    Current Medications: Prior to Admission medications   Medication Sig Start Date End Date Taking? Authorizing Provider  acetaminophen (TYLENOL) 500 MG tablet Take 500 mg by mouth every 6 (six) hours as needed for moderate pain.    [provider]  albuterol (VENTOLIN HFA) 108 (90 Base) MCG/ACT inhaler INHALE 2 PUFFS INTO LUNGS EVERY 6 HOURS AS NEEDED FOR WHEEZING OR SHORTNESS OF BREATH 01/19/17   Deneise Lever, MD  amiodarone (PACERONE) 200 MG  tablet Take 1 tablet (200 mg total) by mouth daily. 07/29/17   Leanor Kail, PA  aspirin 81 MG EC tablet Take 1 tablet (81 mg total) by mouth daily. 07/13/17   Gold, Wilder Glade, PA-C  azelastine (ASTELIN) 137 MCG/SPRAY nasal spray Place 1-2 sprays into the nose 2 (two) times daily as needed for rhinitis or allergies.     [provider]  benzonatate (TESSALON) 200 MG capsule Take 1 capsule (200 mg total) by mouth every 6 (six) hours as needed for cough. 11/19/16   Baird Lyons D, MD  budesonide-formoterol (SYMBICORT) 160-4.5 MCG/ACT inhaler INHALE  2 PUFF INTO THE LUNGS 2 TIMES A DAY. 01/19/17   Baird Lyons D, MD  Cholecalciferol (VITAMIN D-3) 5000 UNITS TABS Take 5,000 Units by mouth daily with breakfast.     [provider]  COLCRYS 0.6 MG tablet Take 0.6 mg by mouth daily. With breakfast 01/29/12   [provider]  Cyanocobalamin (VITAMIN B12 SL) Place 1,200 mcg under the tongue daily. 1 dropper    [provider]  dextromethorphan (DELSYM) 30 MG/5ML liquid Take 30 mg by mouth at bedtime as needed for cough.     [provider]  DHEA 50 MG TABS Take 50 mg by mouth daily with breakfast.     [provider]  fexofenadine (ALLEGRA) 180 MG tablet Take 180 mg by mouth daily with breakfast.     [provider]  finasteride (PROSCAR) 5 MG tablet Take 5 mg by mouth at bedtime.  01/14/14   [provider]  Hypromellose Vertell Limber ALLERGY NASAL SPRAY NA) Place 1 spray into both nostrils daily as needed (for allergies).     [provider]  levothyroxine (SYNTHROID, LEVOTHROID) 112 MCG tablet Take 112 mcg by mouth daily before breakfast.     [provider]  liothyronine (CYTOMEL) 25 MCG tablet Take 25 mcg by mouth every morning.     [provider]  magic mouthwash w/lidocaine SOLN Take 10 mLs by mouth 4 (four) times daily as needed for mouth pain. 07/16/17   Deneise Lever, MD  Menthol, Topical Analgesic, (BLUE-EMU MAXIMUM STRENGTH EX) Apply 1 application topically 4 (four) times daily as needed (for arthritis pain.).    [provider]  metoprolol tartrate (LOPRESSOR) 50 MG tablet Take 1 tablet (50 mg total) by mouth 2 (two) times daily. 07/29/17   Bhagat, Bhavinkumar, PA  milk thistle 175 MG tablet Take 175 mg by mouth daily.    [provider]  Multiple Vitamins-Minerals (ONE-A-DAY MENS HEALTH FORMULA PO) Take 1 tablet by mouth daily with breakfast.     [provider]  omeprazole (PRILOSEC) 40 MG capsule Take 1 capsule (40 mg total)  by mouth 2 (two) times daily. 10/31/16   Josue Hector, MD  perphenazine (TRILAFON) 2 MG tablet Take 2 mg by mouth 2 (two) times daily as needed (for migraines).     [provider]  Probiotic Product (ALIGN) 4 MG CAPS Take 4 mg by mouth daily with breakfast.     [provider]  tamsulosin (FLOMAX) 0.4 MG CAPS capsule Take 0.4 mg by mouth daily after supper.    [provider]  testosterone cypionate (DEPOTESTOTERONE CYPIONATE) 100 MG/ML injection Inject 200 mg into the muscle every Thursday. For IM use only     [provider]  tetrahydrozoline-zinc (VISINE-AC) 0.05-0.25 % ophthalmic solution Place 1 drop into both eyes as needed (dry eyes).    [provider]  venlafaxine (  EFFEXOR-XR) 150 MG 24 hr capsule Take 150 mg by mouth every morning.     [provider]  warfarin (COUMADIN) 2.5 MG tablet Take 1 tablet (2.5 mg total) by mouth daily at 6 PM. Take 7.5mg  (3 tablet tonight) and 5mg  tomorrow and INR check Friday Patient taking differently: Take 2.5 mg by mouth daily at 6 PM. Take 7.5mg  (3 tablet tonight) and 5mg  tomorrow and INR check Friday and as directed 07/29/17   Leanor Kail, PA    Allergies:   Codeine and Tramadol   Social History   Socioeconomic History  . Marital status: Married    Spouse name: None  . Number of children: None  . Years of education: None  . Highest education level: None  Social Needs  . Financial resource strain: None  . Food insecurity - worry: None  . Food insecurity - inability: None  . Transportation needs - medical: None  . Transportation needs - non-medical: None  Occupational History  . Occupation: peanut selling    Comment: owner of buisness  Tobacco Use  . Smoking status: Former Smoker    Packs/day: 0.50    Years: 27.00    Pack years: 13.50    Types: Cigarettes    Last attempt to quit: 10/21/1979    Years since quitting: 37.8  . Smokeless tobacco: Never Used  Substance and Sexual  Activity  . Alcohol use: Yes    Alcohol/week: 12.6 oz    Types: 21 Cans of beer per week    Comment: 4-5 beers a day  . Drug use: No  . Sexual activity: None  Other Topics Concern  . None  Social History Narrative  . None     Family History:  The patient's family history includes Aortic aneurysm (age of onset: 57) in his mother; Heart disease in his unknown relative; Other in his father.   ROS:   Please see the history of present illness.    ROS All other systems reviewed and are negative.   PHYSICAL EXAM:   VS:  BP 122/80   Pulse 74   Ht 5\' 10"  (1.778 m)   Wt 264 lb 4 oz (119.9 kg)   SpO2 95%   BMI 37.92 kg/m    Affect appropriate Obese white male  HEENT: normal Neck supple with no adenopathy JVP normal no bruits no thyromegaly Lungs clear with no wheezing and good diaphragmatic motion Heart:  S1/S2 SEM through AVR no AR  murmur, no rub, gallop or click PMI normal post sternotomy  Abdomen: benighn, BS positve, no tenderness, no AAA no bruit.  No HSM or HJR Distal pulses intact with no bruits No edema Neuro non-focal Skin warm and dry No muscular weakness   Wt Readings from Last 3 Encounters:  08/28/17 264 lb 4 oz (119.9 kg)  08/17/17 261 lb 12.8 oz (118.8 kg)  08/05/17 255 lb (115.7 kg)      Studies/Labs Reviewed:   EKG:  EKG is ordered today.  The ekg ordered today demonstrates sinus rhythm with T wave inversion in inferior lead and lead V6.  Recent Labs: 06/30/2017: ALT 24 07/28/2017: Hemoglobin 12.1; Platelets 235; TSH 1.910 07/29/2017: BUN 11; Creatinine, Ser 0.84; Magnesium 1.7; Potassium 4.1; Sodium 140   Lipid Panel    Component Value Date/Time   CHOL 184 05/05/2017 0931   TRIG 225 (H) 05/05/2017 0931   HDL 45 05/05/2017 0931   CHOLHDL 4.1 05/05/2017 0931   LDLCALC 94 05/05/2017 0931    Additional  studies/ records that were reviewed today include:  As above   ASSESSMENT & PLAN:    1. PAF -  Prior delirium improved after  discontinuation of amiodarone. Agian admitted 07/28/17 for A. fib RVR. Seen by Dr. [name] and he commended amiodarone 200 mg daily. Delerium more likely from Tramadol  His symptoms is improving.  Tolerating amiodarone well with food.will continue for 6 months INR 1.8 today D/c coumadin and start xarelto back   2. HTN - Stable on current regimen   3. 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 Prescott Gum 07/02/17. Post op echo with good Gradients and no leak EF normal reviewed echo from today   4. OSA -Compliant with CPAP.    [name]

## 2017-08-27 DIAGNOSIS — N400 Enlarged prostate without lower urinary tract symptoms: Secondary | ICD-10-CM | POA: Diagnosis not present

## 2017-08-28 ENCOUNTER — Other Ambulatory Visit: Payer: Self-pay

## 2017-08-28 ENCOUNTER — Ambulatory Visit: Payer: Medicare Other | Admitting: Cardiovascular Disease

## 2017-08-28 ENCOUNTER — Ambulatory Visit (INDEPENDENT_AMBULATORY_CARE_PROVIDER_SITE_OTHER): Payer: Medicare Other | Admitting: *Deleted

## 2017-08-28 ENCOUNTER — Encounter: Payer: Self-pay | Admitting: Cardiovascular Disease

## 2017-08-28 ENCOUNTER — Ambulatory Visit (HOSPITAL_COMMUNITY): Payer: Medicare Other | Attending: Internal Medicine

## 2017-08-28 VITALS — BP 122/80 | HR 74 | Ht 70.0 in | Wt 264.2 lb

## 2017-08-28 DIAGNOSIS — I480 Paroxysmal atrial fibrillation: Secondary | ICD-10-CM | POA: Diagnosis not present

## 2017-08-28 DIAGNOSIS — Z5181 Encounter for therapeutic drug level monitoring: Secondary | ICD-10-CM

## 2017-08-28 DIAGNOSIS — Z952 Presence of prosthetic heart valve: Secondary | ICD-10-CM

## 2017-08-28 DIAGNOSIS — I10 Essential (primary) hypertension: Secondary | ICD-10-CM | POA: Diagnosis not present

## 2017-08-28 DIAGNOSIS — I4891 Unspecified atrial fibrillation: Secondary | ICD-10-CM | POA: Diagnosis not present

## 2017-08-28 DIAGNOSIS — Z87891 Personal history of nicotine dependence: Secondary | ICD-10-CM | POA: Insufficient documentation

## 2017-08-28 DIAGNOSIS — Z954 Presence of other heart-valve replacement: Secondary | ICD-10-CM | POA: Diagnosis not present

## 2017-08-28 LAB — POCT INR: INR: 1.8

## 2017-08-28 MED ORDER — RIVAROXABAN 20 MG PO TABS: 20.0000 mg | ORAL_TABLET | Freq: Every day | ORAL | 3 refills | Status: DC

## 2017-08-28 NOTE — Patient Instructions (Addendum)
Medication Instructions:  Your physician has recommended you make the following change in your medication:  1-STOP coumadin  2-START Xarelto 20 mg by mouth daily  Labwork: NONE  Testing/Procedures: NONE  Follow-Up: Your physician wants you to follow-up in: 6 months with Dr. [name]. You will receive a reminder letter in the mail two months in advance. If you don't receive a letter, please call our office to schedule the follow-up appointment.   If you need a refill on your cardiac medications before your next appointment, please call your pharmacy.

## 2017-08-28 NOTE — Progress Notes (Signed)
11/9/1/-1.8 Coumadin Clinic Instructions: Today take 8.75mg  (3 and 1/2 tablets) then start taking 7.5mg  (3 tablets) daily. Recheck INR in 1 week.

## 2017-09-01 ENCOUNTER — Telehealth: Payer: Self-pay | Admitting: Cardiovascular Disease

## 2017-09-01 DIAGNOSIS — R972 Elevated prostate specific antigen [PSA]: Secondary | ICD-10-CM | POA: Diagnosis not present

## 2017-09-01 DIAGNOSIS — N401 Enlarged prostate with lower urinary tract symptoms: Secondary | ICD-10-CM | POA: Diagnosis not present

## 2017-09-01 DIAGNOSIS — R351 Nocturia: Secondary | ICD-10-CM | POA: Diagnosis not present

## 2017-09-01 DIAGNOSIS — R350 Frequency of micturition: Secondary | ICD-10-CM | POA: Diagnosis not present

## 2017-09-01 NOTE — Telephone Encounter (Signed)
LMOM for nurse to call for update

## 2017-09-01 NOTE — Telephone Encounter (Signed)
New Message     Needs orders signed and dated for  [facility], home health care   They are sending over a fax now :  Fax [phone]

## 2017-09-02 ENCOUNTER — Encounter: Payer: Self-pay | Admitting: Cardiothoracic Surgery

## 2017-09-02 NOTE — Telephone Encounter (Signed)
Orders received, placed in MD box for signature. Will fax back after signed.

## 2017-09-02 NOTE — Telephone Encounter (Signed)
Obtained fax orders signed

## 2017-09-04 ENCOUNTER — Other Ambulatory Visit: Payer: Self-pay

## 2017-09-04 ENCOUNTER — Ambulatory Visit (INDEPENDENT_AMBULATORY_CARE_PROVIDER_SITE_OTHER): Payer: Self-pay | Admitting: Cardiothoracic Surgery

## 2017-09-04 ENCOUNTER — Encounter: Payer: Self-pay | Admitting: Cardiothoracic Surgery

## 2017-09-04 VITALS — BP 123/81 | HR 71 | Ht 70.0 in | Wt 255.0 lb

## 2017-09-04 DIAGNOSIS — Z8679 Personal history of other diseases of the circulatory system: Secondary | ICD-10-CM

## 2017-09-04 DIAGNOSIS — Z9889 Other specified postprocedural states: Secondary | ICD-10-CM

## 2017-09-04 NOTE — Progress Notes (Signed)
PCP is Patient, No Pcp Per Referring Provider is Josue Hector, MD  Chief Complaint  Patient presents with  . 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 He is doing well. He is maintaining sinus rhythm. He was seen by his cardiologist.  An echocardiogram was performed which showed good LV function, good function of the prosthetic valve .  He was switched to Xarelto from Coumadin for his A. fib and plans are made to gradually wean off the amiodarone. He denies any symptoms of heart failure or chest pain. He states he has a popping sensation in his sternal incision which I cannot elicit on exam.  Chest x-ray today is clear.  No pleural effusion.  Sternal wires are intact.  Past Medical History:  Diagnosis Date  . Allergic rhinitis   . Anxiety   . Arthritis   . Asthma   . Bladder tumor   . Chronic fatigue   . Chronic fatigue   . Depression    06/30/17 Pt denies being depressed, reports Effexor is taken for Chronic Fatigue   . Dyspnea   . Enlarged prostate   . Fibromyalgia   . GERD (gastroesophageal reflux disease)   . Headache   . History of chronic bronchitis   . History of migraine   . History of toxic encephalopathy   . Hypothyroidism   . OSA on CPAP    CPAP 14  . PAF (paroxysmal atrial fibrillation) ([place]) CARDIOLOGIST -- DR [name]   DX OCT 2013  . 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 Unspecified essential hypertension   . Urethral tumor    PROSTATIC    Past Surgical History:  Procedure Laterality Date  . BENTALL PROCEDURE N/A 07/02/2017   Performed by Ivin Poot, MD at [facility]    . CYSTOSCOPY WITH BILATERAL RETROGRADE PYELOGRAM  BLADDER BIOPSY, PROSTATIC URETHRAL BIOPSY, Bilateral 06/15/2013   Performed by Rolan Bucco, MD at [facility]  . LAPAROSCOPIC CHOLECYSTECTOMY  01-14-2001  . RIGHT/LEFT HEART CATH AND CORONARY  ANGIOGRAPHY N/A 06/12/2017   Performed by Larey Dresser, MD at [facility]  . TRANSESOPHAGEAL ECHOCARDIOGRAM (TEE) N/A 07/02/2017   Performed by Ivin Poot, MD at [facility]  . UMBILICAL HERNIA REPAIR  01-14-2008    Family History  Problem Relation Age of Onset  . Aortic aneurysm Mother 21       cause of death  . Other Father        motor vehicle accident  . Heart disease Unknown        family history    Social History Social History   Tobacco Use  . Smoking status: Former Smoker    Packs/day: 0.50    Years: 27.00    Pack years: 13.50    Types: Cigarettes    Last attempt to quit: 10/21/1979    Years since quitting: 37.8  . Smokeless tobacco: Never Used  Substance Use Topics  . Alcohol use: Yes    Alcohol/week: 12.6 oz    Types: 21 Cans of beer per week    Comment: 4-5 beers a day  . Drug use: No    Current Outpatient Medications  Medication Sig Dispense Refill  . acetaminophen (TYLENOL) 500 MG tablet Take 500 mg by mouth every 6 (six) hours as needed for moderate pain.    Marland Kitchen albuterol (VENTOLIN HFA) 108 (  90 Base) MCG/ACT inhaler INHALE 2 PUFFS INTO LUNGS EVERY 6 HOURS AS NEEDED FOR WHEEZING OR SHORTNESS OF BREATH 18 g 12  . amiodarone (PACERONE) 200 MG tablet Take 1 tablet (200 mg total) by mouth daily. 30 tablet 3  . aspirin 81 MG EC tablet Take 1 tablet (81 mg total) by mouth daily.    Marland Kitchen azelastine (ASTELIN) 137 MCG/SPRAY nasal spray Place 1-2 sprays into the nose 2 (two) times daily as needed for rhinitis or allergies.     . benzonatate (TESSALON) 200 MG capsule Take 1 capsule (200 mg total) by mouth every 6 (six) hours as needed for cough. 30 capsule 0  . budesonide-formoterol (SYMBICORT) 160-4.5 MCG/ACT inhaler INHALE 2 PUFF INTO THE LUNGS 2 TIMES A DAY. 10.2 g 12  . Cholecalciferol (VITAMIN D-3) 5000 UNITS TABS Take 5,000 Units by mouth daily with breakfast.     . COLCRYS 0.6 MG tablet Take 0.6 mg by mouth daily. With breakfast    . Cyanocobalamin (VITAMIN  B12 SL) Place 1,200 mcg under the tongue daily. 1 dropper    . dextromethorphan (DELSYM) 30 MG/5ML liquid Take 30 mg by mouth at bedtime as needed for cough.     Marland Kitchen DHEA 50 MG TABS Take 50 mg by mouth daily with breakfast.     . fexofenadine (ALLEGRA) 180 MG tablet Take 180 mg by mouth daily with breakfast.     . finasteride (PROSCAR) 5 MG tablet Take 5 mg by mouth at bedtime.     . Hypromellose (ALZAIR ALLERGY NASAL SPRAY NA) Place 1 spray into both nostrils daily as needed (for allergies).     Marland Kitchen levothyroxine (SYNTHROID, LEVOTHROID) 112 MCG tablet Take 112 mcg by mouth daily before breakfast.     . liothyronine (CYTOMEL) 25 MCG tablet Take 25 mcg by mouth every morning.     . magic mouthwash w/lidocaine SOLN Take 10 mLs by mouth 4 (four) times daily as needed for mouth pain. 240 mL 1  . Menthol, Topical Analgesic, (BLUE-EMU MAXIMUM STRENGTH EX) Apply 1 application topically 4 (four) times daily as needed (for arthritis pain.).    Marland Kitchen metoprolol tartrate (LOPRESSOR) 50 MG tablet Take 1 tablet (50 mg total) by mouth 2 (two) times daily. 60 tablet 3  . milk thistle 175 MG tablet Take 175 mg by mouth daily.    . Multiple Vitamins-Minerals (ONE-A-DAY MENS HEALTH FORMULA PO) Take 1 tablet by mouth daily with breakfast.     . omeprazole (PRILOSEC) 40 MG capsule Take 1 capsule (40 mg total) by mouth 2 (two) times daily. 180 capsule 1  . perphenazine (TRILAFON) 2 MG tablet Take 2 mg by mouth 2 (two) times daily as needed (for migraines).     . Probiotic Product (ALIGN) 4 MG CAPS Take 4 mg by mouth daily with breakfast.     . rivaroxaban (XARELTO) 20 MG TABS tablet Take 1 tablet (20 mg total) daily with supper by mouth. 90 tablet 3  . tamsulosin (FLOMAX) 0.4 MG CAPS capsule Take 0.4 mg by mouth daily after supper.    . testosterone cypionate (DEPOTESTOTERONE CYPIONATE) 100 MG/ML injection Inject 200 mg into the muscle every Thursday. For IM use only     . tetrahydrozoline-zinc (VISINE-AC) 0.05-0.25 %  ophthalmic solution Place 1 drop into both eyes as needed (dry eyes).    . venlafaxine (EFFEXOR-XR) 150 MG 24 hr capsule Take 150 mg by mouth every morning.      No current facility-administered medications for this visit.  Allergies  Allergen Reactions  . Codeine Itching  . Tramadol     dellusion     Review of Systems Patient is anxious to increase his activity level. He feels that his chronic fatigue syndrome has improved since surgery. He is maintaining a weight loss diet since he was obese preop  BP 123/81   Pulse 71   Ht 5\' 10"  (1.778 m)   Wt 255 lb (115.7 kg)   SpO2 98%   BMI 36.59 kg/m  Physical Exam      Exam    General- alert and comfortable   Lungs- clear without rales, wheezes   Cor- regular rate and rhythm, no murmur , gallop   Abdomen- soft, non-tender   Extremities - warm, non-tender, minimal edema   Neuro- oriented, appropriate, no focal weakness   Diagnostic Tests: Chest x-ray clear  Impression: Excellent early recovery after biologic Bentall procedure for a large ascending aneurysm  Plan: Return in March 2019 with CTA to assess thoracic aortic repair. He can now lift up to 20 pounds. He can travel out of state.  Len Childs, MD [facility] [phone]

## 2017-09-07 ENCOUNTER — Telehealth: Payer: Self-pay | Admitting: Cardiovascular Disease

## 2017-09-07 NOTE — Telephone Encounter (Signed)
F.u Message  Ria Comment from [facility] call requesting to speak with RN to f/u on on orders for pt. Please call back to discuss

## 2017-09-07 NOTE — Telephone Encounter (Signed)
Left message to call back  

## 2017-09-15 ENCOUNTER — Encounter (HOSPITAL_COMMUNITY): Payer: Self-pay

## 2017-09-16 NOTE — Telephone Encounter (Signed)
Orders were signed and faxed back

## 2017-10-05 ENCOUNTER — Telehealth: Payer: Self-pay | Admitting: Cardiovascular Disease

## 2017-10-05 NOTE — Telephone Encounter (Signed)
Patient's wife calling about an episode patient had this morning. Patient complaining of some tightness, pain in his upper jaw, and feeling faint this morning. Patient stated he felt this symptoms, and felt like a rush then felt better. Patient denies any symptoms right now. Patient's vital signs stable BP 132/90, HR 85, and O2 97%. Consulted DOD, Dr. Irish Lack, he advised patient to take it easy, keep hydrated, and to call our office if his symptoms came back. Informed patient of recommendations and that if his symptoms get worse to go to the ED. Will forward to Dr. [name] for further advisement.

## 2017-10-05 NOTE — Telephone Encounter (Signed)
New Message    STAT if patient feels like he/she is going to faint   1) Are you dizzy now? no  2) Do you feel faint or have you passed out? Yes, feeling faint   3) Do you have any other symptoms? Yes, tightness in the upper jaw   4) Have you checked your HR and BP (record if available)? HR 97 and oxygen 85, BP 132/90

## 2017-10-05 NOTE — Telephone Encounter (Signed)
Ok

## 2017-11-05 ENCOUNTER — Encounter: Payer: Self-pay | Admitting: Internal Medicine

## 2017-11-05 ENCOUNTER — Ambulatory Visit: Payer: Medicare Other | Admitting: Internal Medicine

## 2017-11-05 VITALS — BP 136/80 | HR 64 | Ht 70.0 in | Wt 266.8 lb

## 2017-11-05 DIAGNOSIS — K219 Gastro-esophageal reflux disease without esophagitis: Secondary | ICD-10-CM

## 2017-11-05 DIAGNOSIS — G4733 Obstructive sleep apnea (adult) (pediatric): Secondary | ICD-10-CM | POA: Diagnosis not present

## 2017-11-05 DIAGNOSIS — J42 Unspecified chronic bronchitis: Secondary | ICD-10-CM

## 2017-11-05 DIAGNOSIS — I4891 Unspecified atrial fibrillation: Secondary | ICD-10-CM

## 2017-11-05 MED ORDER — UMECLIDINIUM-VILANTEROL 62.5-25 MCG/INH IN AEPB: 1.0000 | INHALATION_SPRAY | Freq: Every day | RESPIRATORY_TRACT | 0 refills | Status: DC

## 2017-11-05 NOTE — Assessment & Plan Note (Signed)
He sleeps much better with CPAP and download confirms excellent compliance and control pressure 12.  No changes needed.

## 2017-11-05 NOTE — Assessment & Plan Note (Signed)
After discussion, he will continue omeprazole twice daily but try elevating head of bed on brick

## 2017-11-05 NOTE — Assessment & Plan Note (Signed)
Not clear if some of his cough now is from postnasal drip and reflux.  He has not been using a maintenance controller very consistently and says the 2 steroid inhalers he has tried both caused dry cough after a while.  For comparison we will let him try Anoro

## 2017-11-05 NOTE — Progress Notes (Signed)
History of Present Illness: . male former smoker with asthma/chronic bronchitis and allergic rhinitis, OSA PAF/ anticoagulation, aortic valve replacement, toxic encephalopathy, light sensitivity, chronic fatigue/fibromyalgia NPSG 03/24/03- AHI 88/ hr, desaturation to 75%, body weight 244 lbs PFT 06/01/17-WNL-FVC 4.46/96%, FEV1 3.71/106%, ratio 0.83, FEF 25-75% 4.64/167%, no response to dilator, TLC 107%, DLCO 101%  ------------------------------------------------------------------ 07/16/17- 67 year old male former smoker followed forasthma/chronic bronchitis and allergic rhinitis,  OSA,  complicated by PAF/ anticoagulation, aortic aneurysm, toxic encephalopathy, light sensitivity, chronic fatigue/fibromyalgia  HOSP 9/13-9/23/18- Recent aortic valve replacement bioprosthetic ascending aortic graft repair. Severe cough for the past few weeks. Not able to sleep at night. Had open heart surgery on 9/13. Sore throat.  Symbicort 160 With heart surgery he was intubated and he also had TEE. Using Tylenol for pain which he says does okay. He was given hydrocodone and tramadol but they caused mental disturbance "crazy train hallucinations". Temazepam made him "crazy". Taking omeprazole twice daily.. Using benzonatate twice daily but wants something more to suppress cough, which disturb sleep, interferes with CPAP, and aggravates his sore throat. Sleeping in a lift chair and using CPAP every night. PFT 06/01/17-WNL-FVC 4.46/96%, FEV1 3.71/106%, ratio 0.83, FEF 25-75% 4.64/167%, no response to dilator, TLC 107%, DLCO 101%  CXR 07/12/17 IMPRESSION: 1. Persistent small right-sided pneumothorax, a little smaller in the interval. Persistent air in the subcutaneous tissues of the right chest wall. No other changes.  11/05/17- 67 year old male former smoker followed for asthma/chronic bronchitis and allergic rhinitis,  OSA,  complicated by PAF/ anticoagulation, aortic valve replacemnt, toxic encephalopathy, light  sensitivity, chronic fatigue/fibromyalgia CPAP 12/Advanced ----CPAP follow up, ongoing congestion, post nasal drip, wet cough.  Download 100% compliance, AHI 0.7/hour. "I love my CPAP". He says everything is doing better since he had his valve replacement.  Rarely needs rescue inhaler now.  Has some days of increased shortness of breath occasionally during which he would use Symbicort but finds if he uses that, or Advair, for more than a couple of weeks at a time he gets a dry cough. Rhinitis with postnasal drip treated with Flonase or Astelin.  Both cause nosebleeds if used routinely. Some increase in cough recently he thinks is related to reflux.  Continues omeprazole twice daily but never raised the head of his bed. CXR-08/05/17 RESSION: 1. No acute cardiopulmonary abnormalities.  ROS-see HPI     + = pos Constitutional:   No-   weight loss, night sweats, fevers, chills, fatigue, lassitude. HEENT:   No-  headaches, difficulty swallowing, tooth/dental problems,  sore throat,       No-  sneezing, itching, ear ache,                    No-nasal congestion, + post nasal drip,  CV:  No-   chest pain, orthopnea, PND, swelling in lower extremities, anasarca,  dizziness, palpitations Resp: No- coughing up of blood.              No-   change in color of mucus.  No- wheezing.   Skin: Clear GI:  + heartburn, indigestion, abdominal pain, nausea, vomiting,  GU:  MS:  No-   joint pain or swelling.   Neuro-     nothing unusual Psych:  No- change in mood or affect. + depression or anxiety.  No memory loss.  OBJ- Physical Exam General- Alert, Oriented, Affect-appropriate, Distress- none acute. Sunglasses, + overweight Skin- +eczematoid patches on lower calves, nonspecific, excoriated Lymphadenopathy- none Head- atraumatic. Stares aimlessly around room like  a blind person.             Eyes- Gross vision intact, PERRLA, conjunctivae and secretions clear, + sunglasses            Ears- Hearing,  canals-normal            Nose- Clear, no-Septal dev, mucus, polyps, erosion, perforation             Throat- Mallampati III-IV , mucosa +red , drainage- none, tonsils- atrophic Neck- flexible , trachea midline, no stridor , thyroid nl, carotid no bruit Chest - symmetrical excursion , unlabored           Heart/CV- RRR , no murmur , no gallop  , no rub, nl s1 s2, + slight click                           - JVD- none , edema- none, stasis changes- none, varices- none           Lung- + clear/ unlabored, wheeze- none, cough-none, dullness-none, rub- none           Chest wall-  Abd-  Br/ Gen/ Rectal- Not done, not indicated Extrem- cyanosis- none, clubbing, none, atrophy- none, strength- nl Neuro- grossly intact to observation

## 2017-11-05 NOTE — Patient Instructions (Signed)
We can continue CPAP 12, mask of choice,humidifier, supplies, AirView  Sample Anoro inhaler     Inhale 1 puff once daily    Try this as a maintenance inhaler instead of Symbicort. Ok to use the Ventolin rescue inhaler occasionally if needed  Please call as needed

## 2017-11-05 NOTE — Assessment & Plan Note (Signed)
Pulse feels very regular at this visit

## 2017-11-18 ENCOUNTER — Telehealth: Payer: Self-pay | Admitting: Cardiovascular Disease

## 2017-11-18 NOTE — Telephone Encounter (Signed)
Called patient's wife back. Patient has been having slurred speech, right arm weakness, and jaw pain for several days. They are out of town right now until next week. Patient's wife stated that patient's symptoms have improved now. Informed patient's wife of signs and symptoms of a stroke and to call 911 for these signs and symptoms of a stroke. Since patient's symptoms have improved, per patient's wife, suggested patient go to urgent care or ED to be evaluated. Patient's wife verbalized understanding and will take patient to urgent care or ED.

## 2017-11-18 NOTE — Telephone Encounter (Signed)
Patient wife calling,  States that patient is having some symptoms and she would like your opinion on whether its heart-related or not. Patient is out of town

## 2017-11-30 ENCOUNTER — Other Ambulatory Visit: Payer: Self-pay | Admitting: Physician Assistant

## 2017-12-03 ENCOUNTER — Telehealth: Payer: Self-pay | Admitting: Internal Medicine

## 2017-12-03 NOTE — Telephone Encounter (Signed)
Spoke with Juliann Pulse. She is aware of CY's recs. Nothing else needed at time of call.

## 2017-12-03 NOTE — Telephone Encounter (Signed)
This can be a reaction to amiodarone, one of the other meds, or other triggers such as a mild viral infection. If it was the amiodarone, it may take several days to fade away even after stopping the drug.  It may help to take an antihistamine- perhaps non-sedating, like Zyrtec or Claritin.

## 2017-12-03 NOTE — Telephone Encounter (Signed)
Spoke with pt's spouse, Valetta Fuller. Valetta Fuller states during pt's beach trip, pt has ED visit on 11/19/17. pt experienced tongue. Lip and face swelling. Symptoms occurred a few days prior to ED visit. Symptoms did improved after depo injection during ED visit. Pt was dx with angioedema. Valetta Fuller states pt is now experiencing these symptoms again, as symptoms come and go.  I advised pt to contact PCP regarding symptoms. Valetta Fuller states pt has consult with Dr. Kenton Kingfisher 01/2018, as previous PCP has retired. Katy requested that Dr. Annamaria Boots address symptoms, as pt see CY for allergies.  Valetta Fuller states she did some research on pt's medications and read that Amiodarone can cause these symptoms. Katy contact cardiology on 11/18/17, who suggested that pt stop Amiodarone. Symptoms have not subsided since stopping this medication. Pt is requesting OV- there is no availability.  CY please advise. Thanks   Current Outpatient Medications on File Prior to Visit  Medication Sig Dispense Refill  . acetaminophen (TYLENOL) 500 MG tablet Take 500 mg by mouth every 6 (six) hours as needed for moderate pain.    Marland Kitchen albuterol (VENTOLIN HFA) 108 (90 Base) MCG/ACT inhaler INHALE 2 PUFFS INTO LUNGS EVERY 6 HOURS AS NEEDED FOR WHEEZING OR SHORTNESS OF BREATH 18 g 12  . amiodarone (PACERONE) 200 MG tablet Take 1 tablet (200 mg total) by mouth daily. 30 tablet 3  . aspirin 81 MG EC tablet Take 1 tablet (81 mg total) by mouth daily.    Marland Kitchen azelastine (ASTELIN) 137 MCG/SPRAY nasal spray Place 1-2 sprays into the nose 2 (two) times daily as needed for rhinitis or allergies.     . benzonatate (TESSALON) 200 MG capsule Take 1 capsule (200 mg total) by mouth every 6 (six) hours as needed for cough. 30 capsule 0  . budesonide-formoterol (SYMBICORT) 160-4.5 MCG/ACT inhaler INHALE 2 PUFF INTO THE LUNGS 2 TIMES A DAY. 10.2 g 12  . Cholecalciferol (VITAMIN D-3) 5000 UNITS TABS Take 5,000 Units by mouth daily with breakfast.     . COLCRYS 0.6 MG tablet Take 0.6 mg by mouth  daily. With breakfast    . Cyanocobalamin (VITAMIN B12 SL) Place 1,200 mcg under the tongue daily. 1 dropper    . dextromethorphan (DELSYM) 30 MG/5ML liquid Take 30 mg by mouth at bedtime as needed for cough.     Marland Kitchen DHEA 50 MG TABS Take 50 mg by mouth daily with breakfast.     . fexofenadine (ALLEGRA) 180 MG tablet Take 180 mg by mouth daily with breakfast.     . finasteride (PROSCAR) 5 MG tablet Take 5 mg by mouth at bedtime.     . Hypromellose (ALZAIR ALLERGY NASAL SPRAY NA) Place 1 spray into both nostrils daily as needed (for allergies).     Marland Kitchen levothyroxine (SYNTHROID, LEVOTHROID) 112 MCG tablet Take 112 mcg by mouth daily before breakfast.     . liothyronine (CYTOMEL) 25 MCG tablet Take 25 mcg by mouth every morning.     . magic mouthwash w/lidocaine SOLN Take 10 mLs by mouth 4 (four) times daily as needed for mouth pain. 240 mL 1  . Menthol, Topical Analgesic, (BLUE-EMU MAXIMUM STRENGTH EX) Apply 1 application topically 4 (four) times daily as needed (for arthritis pain.).    Marland Kitchen metoprolol tartrate (LOPRESSOR) 50 MG tablet TAKE 1 TABLET(50 MG) BY MOUTH TWICE DAILY 60 tablet 8  . milk thistle 175 MG tablet Take 175 mg by mouth daily.    . Multiple Vitamins-Minerals (ONE-A-DAY MENS HEALTH FORMULA PO) Take 1  tablet by mouth daily with breakfast.     . omeprazole (PRILOSEC) 40 MG capsule Take 1 capsule (40 mg total) by mouth 2 (two) times daily. 180 capsule 1  . perphenazine (TRILAFON) 2 MG tablet Take 2 mg by mouth 2 (two) times daily as needed (for migraines).     . Probiotic Product (ALIGN) 4 MG CAPS Take 4 mg by mouth daily with breakfast.     . rivaroxaban (XARELTO) 20 MG TABS tablet Take 1 tablet (20 mg total) daily with supper by mouth. 90 tablet 3  . tamsulosin (FLOMAX) 0.4 MG CAPS capsule Take 0.4 mg by mouth daily after supper.    . testosterone cypionate (DEPOTESTOTERONE CYPIONATE) 100 MG/ML injection Inject 200 mg into the muscle every Thursday. For IM use only     .  tetrahydrozoline-zinc (VISINE-AC) 0.05-0.25 % ophthalmic solution Place 1 drop into both eyes as needed (dry eyes).    Marland Kitchen umeclidinium-vilanterol (ANORO ELLIPTA) 62.5-25 MCG/INH AEPB Inhale 1 puff into the lungs daily. 1 each 0  . venlafaxine (EFFEXOR-XR) 150 MG 24 hr capsule Take 150 mg by mouth every morning.      No current facility-administered medications on file prior to visit.     Allergies  Allergen Reactions  . Codeine Itching  . Tramadol     dellusion

## 2017-12-04 DIAGNOSIS — G4733 Obstructive sleep apnea (adult) (pediatric): Secondary | ICD-10-CM | POA: Diagnosis not present

## 2017-12-09 ENCOUNTER — Encounter: Payer: Self-pay | Admitting: Internal Medicine

## 2017-12-09 ENCOUNTER — Encounter: Payer: Self-pay | Admitting: Cardiothoracic Surgery

## 2017-12-09 ENCOUNTER — Other Ambulatory Visit: Payer: Self-pay | Admitting: Cardiothoracic Surgery

## 2017-12-09 DIAGNOSIS — Z952 Presence of prosthetic heart valve: Secondary | ICD-10-CM

## 2017-12-09 DIAGNOSIS — I712 Thoracic aortic aneurysm, without rupture, unspecified: Secondary | ICD-10-CM

## 2017-12-09 MED ORDER — UMECLIDINIUM-VILANTEROL 62.5-25 MCG/INH IN AEPB: 1.0000 | INHALATION_SPRAY | Freq: Every day | RESPIRATORY_TRACT | 5 refills | Status: DC

## 2017-12-14 ENCOUNTER — Telehealth: Payer: Self-pay

## 2018-01-06 ENCOUNTER — Other Ambulatory Visit: Payer: Medicare Other

## 2018-01-06 ENCOUNTER — Ambulatory Visit: Payer: Medicare Other | Admitting: Cardiothoracic Surgery

## 2018-01-13 ENCOUNTER — Encounter: Payer: Self-pay | Admitting: Cardiothoracic Surgery

## 2018-01-13 ENCOUNTER — Ambulatory Visit
Admission: RE | Admit: 2018-01-13 | Discharge: 2018-01-13 | Disposition: A | Discharge status: Home / Self Care | Payer: Medicare Other | Source: Ambulatory Visit | Attending: Cardiothoracic Surgery | Admitting: Cardiothoracic Surgery

## 2018-01-13 ENCOUNTER — Ambulatory Visit: Payer: Medicare Other | Admitting: Cardiothoracic Surgery

## 2018-01-13 VITALS — BP 149/80 | HR 58 | Resp 20 | Ht 70.0 in | Wt 265.0 lb

## 2018-01-13 DIAGNOSIS — I712 Thoracic aortic aneurysm, without rupture, unspecified: Secondary | ICD-10-CM

## 2018-01-13 DIAGNOSIS — Q231 Congenital insufficiency of aortic valve: Secondary | ICD-10-CM | POA: Diagnosis not present

## 2018-01-13 DIAGNOSIS — Z8679 Personal history of other diseases of the circulatory system: Secondary | ICD-10-CM | POA: Diagnosis not present

## 2018-01-13 DIAGNOSIS — I7121 Aneurysm of the ascending aorta, without rupture: Secondary | ICD-10-CM

## 2018-01-13 DIAGNOSIS — I351 Nonrheumatic aortic (valve) insufficiency: Secondary | ICD-10-CM

## 2018-01-13 DIAGNOSIS — I719 Aortic aneurysm of unspecified site, without rupture: Secondary | ICD-10-CM | POA: Diagnosis not present

## 2018-01-13 DIAGNOSIS — Z952 Presence of prosthetic heart valve: Secondary | ICD-10-CM | POA: Diagnosis not present

## 2018-01-13 DIAGNOSIS — Z9889 Other specified postprocedural states: Secondary | ICD-10-CM

## 2018-01-13 MED ORDER — IOPAMIDOL (ISOVUE-370) INJECTION 76%
75.0000 mL | Freq: Once | INTRAVENOUS | Status: AC | PRN
  Administered 2018-01-13: 75 mL via INTRAVENOUS

## 2018-01-13 NOTE — Progress Notes (Signed)
PCP is Shirline Frees, MD Referring Provider is Josue Hector, MD  Chief Complaint  Patient presents with  . 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 Had postop delirium, postop A. fib but has had a good recovery and now is at a high functional status.  He returns with a CTA to assess his aortic graft and surgical repair.  Previously a postop echocardiogram showed AVR to be functioning normally.  He has chronic atrial fibrillation on Xarelto and maintained sinus rhythm majority of the time.  CTA shows normal ascending aortic graft, no residual aneurysm, no pseudoaneurysm.  Lungs are clear. Mediastinal images show sternal wires intact but there is some minimal sternal separation in between the wires probably related to the patient's postop delirium and inability to follow sternal precautions.  Patient denies any symptoms of shortness of breath or sternal pain.  He does have some intermittent popping sensation.  Most recently he has had some left shoulder arthritis with pain.  Past Medical History:  Diagnosis Date  . Allergic rhinitis   . Anxiety   . Arthritis   . Asthma   . Bladder tumor   . Chronic fatigue   . Chronic fatigue   . Depression    06/30/17 Pt denies being depressed, reports Effexor is taken for Chronic Fatigue   . Dyspnea   . Enlarged prostate   . Fibromyalgia   . GERD (gastroesophageal reflux disease)   . Headache   . History of chronic bronchitis   . History of migraine   . History of toxic encephalopathy   . Hypothyroidism   . OSA on CPAP    CPAP 14  . PAF (paroxysmal atrial fibrillation) ([place]) CARDIOLOGIST -- DR [name]   DX OCT 2013  . 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 Unspecified essential hypertension   . Urethral tumor    PROSTATIC    Past  Surgical History:  Procedure Laterality Date  . BENTALL PROCEDURE N/A 07/02/2017   Procedure: BENTALL PROCEDURE;  Surgeon: Ivin Poot, MD;  Location: [place];  Service: Open Heart Surgery;  Laterality: N/A;  WITH CIRC ARREST  . COLONOSCOPY    . CYSTOSCOPY W/ RETROGRADES Bilateral 06/15/2013   Procedure: CYSTOSCOPY WITH BILATERAL RETROGRADE PYELOGRAM  BLADDER BIOPSY, PROSTATIC URETHRAL BIOPSY, ;  Surgeon: Molli Hazard, MD;  Location: [facility];  Service: Urology;  Laterality: Bilateral;  . LAPAROSCOPIC CHOLECYSTECTOMY  01-14-2001  . RIGHT/LEFT HEART CATH AND CORONARY ANGIOGRAPHY N/A 06/12/2017   Procedure: RIGHT/LEFT HEART CATH AND CORONARY ANGIOGRAPHY;  Surgeon: Larey Dresser, MD;  Location: [place] [facility];  Service: Cardiovascular;  Laterality: N/A;  . TEE WITHOUT CARDIOVERSION N/A 07/02/2017   Procedure: TRANSESOPHAGEAL ECHOCARDIOGRAM (TEE);  Surgeon: Prescott Gum, Collier Salina, MD;  Location: [place];  Service: Open Heart Surgery;  Laterality: N/A;  . UMBILICAL HERNIA REPAIR  01-14-2008    Family History  Problem Relation Age of Onset  . Aortic aneurysm Mother 72       cause of death  . Other Father        motor vehicle accident  . Heart disease Unknown        family history    Social History Social History   Tobacco Use  . Smoking status: Former  Smoker    Packs/day: 0.50    Years: 27.00    Pack years: 13.50    Types: Cigarettes    Last attempt to quit: 10/21/1979    Years since quitting: 38.2  . Smokeless tobacco: Never Used  Substance Use Topics  . Alcohol use: Yes    Alcohol/week: 12.6 oz    Types: 21 Cans of beer per week    Comment: 4-5 beers a day  . Drug use: No    Current Outpatient Medications  Medication Sig Dispense Refill  . acetaminophen (TYLENOL) 500 MG tablet Take 500 mg by mouth every 6 (six) hours as needed for moderate pain.    Marland Kitchen albuterol (VENTOLIN HFA) 108 (90 Base) MCG/ACT inhaler INHALE 2 PUFFS INTO LUNGS EVERY 6 HOURS AS  NEEDED FOR WHEEZING OR SHORTNESS OF BREATH 18 g 12  . aspirin 81 MG EC tablet Take 1 tablet (81 mg total) by mouth daily.    Marland Kitchen azelastine (ASTELIN) 137 MCG/SPRAY nasal spray Place 1-2 sprays into the nose 2 (two) times daily as needed for rhinitis or allergies.     . benzonatate (TESSALON) 200 MG capsule Take 1 capsule (200 mg total) by mouth every 6 (six) hours as needed for cough. 30 capsule 0  . Cholecalciferol (VITAMIN D-3) 5000 UNITS TABS Take 5,000 Units by mouth daily with breakfast.     . COLCRYS 0.6 MG tablet Take 0.6 mg by mouth daily. With breakfast    . Cyanocobalamin (VITAMIN B12 SL) Place 1,200 mcg under the tongue daily. 1 dropper    . dextromethorphan (DELSYM) 30 MG/5ML liquid Take 30 mg by mouth at bedtime as needed for cough.     Marland Kitchen DHEA 50 MG TABS Take 50 mg by mouth daily with breakfast.     . finasteride (PROSCAR) 5 MG tablet Take 5 mg by mouth at bedtime.     . Hypromellose (ALZAIR ALLERGY NASAL SPRAY NA) Place 1 spray into both nostrils daily as needed (for allergies).     Marland Kitchen levothyroxine (SYNTHROID, LEVOTHROID) 112 MCG tablet Take 112 mcg by mouth daily before breakfast.     . liothyronine (CYTOMEL) 25 MCG tablet Take 25 mcg by mouth every morning.     . loratadine (CLARITIN) 10 MG tablet Take 10 mg by mouth daily.    . Menthol, Topical Analgesic, (BLUE-EMU MAXIMUM STRENGTH EX) Apply 1 application topically 4 (four) times daily as needed (for arthritis pain.).    Marland Kitchen metoprolol tartrate (LOPRESSOR) 50 MG tablet TAKE 1 TABLET(50 MG) BY MOUTH TWICE DAILY 60 tablet 8  . Multiple Vitamins-Minerals (ONE-A-DAY MENS HEALTH FORMULA PO) Take 1 tablet by mouth daily with breakfast.     . omeprazole (PRILOSEC) 40 MG capsule Take 1 capsule (40 mg total) by mouth 2 (two) times daily. 180 capsule 1  . perphenazine (TRILAFON) 2 MG tablet Take 2 mg by mouth 2 (two) times daily as needed (for migraines).     . Probiotic Product (ALIGN) 4 MG CAPS Take 4 mg by mouth daily with breakfast.     .  rivaroxaban (XARELTO) 20 MG TABS tablet Take 1 tablet (20 mg total) daily with supper by mouth. 90 tablet 3  . tamsulosin (FLOMAX) 0.4 MG CAPS capsule Take 0.4 mg by mouth daily after supper.    . testosterone cypionate (DEPOTESTOTERONE CYPIONATE) 100 MG/ML injection Inject 200 mg into the muscle every Thursday. For IM use only     . tetrahydrozoline-zinc (VISINE-AC) 0.05-0.25 % ophthalmic solution Place 1 drop into  both eyes as needed (dry eyes).    Marland Kitchen umeclidinium-vilanterol (ANORO ELLIPTA) 62.5-25 MCG/INH AEPB Inhale 1 puff into the lungs daily. 60 each 5  . venlafaxine (EFFEXOR-XR) 150 MG 24 hr capsule Take 150 mg by mouth every morning.      No current facility-administered medications for this visit.     Allergies  Allergen Reactions  . Amiodarone Swelling  . Codeine Itching  . Tramadol     dellusion     Review of Systems  10 pound weight gain postop No significant respiratory problems since surgery No bleeding complications from Xarelto No palpitations or symptoms of A. Fib No ankle edema  BP (!) 149/80   Pulse (!) 58   Resp 20   Ht 5\' 10"  (1.778 m)   Wt 265 lb (120.2 kg)   SpO2 97% Comment: RA  BMI 38.02 kg/m  Physical Exam      Exam    General- alert and comfortable    Neck- no JVD, no cervical adenopathy palpable, no carotid bruit   Lungs- clear without rales, wheezes.  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  I would be very hesitant to  rewire the sternum because of his severe delirium he developed postanesthesia and the probability the same problem could recur and be even worse Hypertension Paroxysmal atrial fibrillation on medication  Plan: Patient will return in 3 months to assess his sternum and to check blood pressure. Currently he his lifting limit is 25 pounds Continue current medications.  Len Childs, MD [facility] [phone]

## 2018-01-21 ENCOUNTER — Ambulatory Visit: Payer: Medicare Other | Admitting: Adult Health

## 2018-01-21 ENCOUNTER — Telehealth: Payer: Self-pay | Admitting: Internal Medicine

## 2018-01-21 ENCOUNTER — Encounter: Payer: Self-pay | Admitting: Adult Health

## 2018-01-21 DIAGNOSIS — T7840XD Allergy, unspecified, subsequent encounter: Secondary | ICD-10-CM | POA: Diagnosis not present

## 2018-01-21 DIAGNOSIS — T7840XA Allergy, unspecified, initial encounter: Secondary | ICD-10-CM | POA: Insufficient documentation

## 2018-01-21 NOTE — Telephone Encounter (Signed)
Patient's wife will be unavailable from 63 to 1 due to appt but she can bring him in today anytime after 1:00 pm. Nothing available on CY's schedule.

## 2018-01-21 NOTE — Telephone Encounter (Signed)
Called and spoke with pt's wife Tye Maryland regarding pt's swollen face and tongue Advised her of CY recommendations, that CY agreed pt needs to seek ER today Pt refuses to go to ED, and refuses EMS  Routing message to [facility]

## 2018-01-21 NOTE — Progress Notes (Signed)
@Patient  ID: [name], male    DOB: 1951/02/05, 67 y.o.   MRN: [id number]  Chief Complaint  Patient presents with  . Acute Visit    Cough     Referring provider: Shirline Frees, MD  HPI: 67 year old male former smoker followed for asthma and chronic bronchitis, allergic rhinitis, obstructive sleep apnea Past medical history significant for A. fib on chronic anticoagulation, Depression   TEST  PFT 06/01/17-WNL-FVC 4.46/96%, FEV1 3.71/106%, ratio 0.83, FEF 25-75% 4.64/167%, no response to dilator, TLC 107%, DLCO 101%   01/21/2018 Acute OV : Facial swelling  Pt presents for an acute office visit. Complains of 3 days of facial swelling /puffiness and tongue swelling . Feels tongue is thicker . No dysphagia, wheezing , chest pain or dyspnea.  No new meds . No recent travel . No recent abx. . Eats nuts without known issues. No rash .  Took Benadryl 01/21/18 at 1330 .  Had similar episode in Jan 2019 ,went to ER at [facility] ER . Diagnosed with Angioedema . Was given steroid injection and benadryl . Amiodarone was stopped . Not on ACE inhibitor or ARB .   MAR review does show that Trilafon and Proscar have incidence angioedema .   Allergies  Allergen Reactions  . Amiodarone Swelling  . Codeine Itching  . Tramadol     dellusion     Immunization History  Administered Date(s) Administered  . Influenza,inj,Quad PF,6+ Mos 10/31/2016, 07/12/2017  . Pneumococcal Polysaccharide-23 07/12/2017    Past Medical History:  Diagnosis Date  . Allergic rhinitis   . Anxiety   . Arthritis   . Asthma   . Bladder tumor   . Chronic fatigue   . Chronic fatigue   . Depression    06/30/17 Pt denies being depressed, reports Effexor is taken for Chronic Fatigue   . Dyspnea   . Enlarged prostate   . Fibromyalgia   . GERD (gastroesophageal reflux disease)   . Headache   . History of chronic bronchitis   . History of migraine   . History of toxic encephalopathy   . Hypothyroidism   . OSA on  CPAP    CPAP 14  . PAF (paroxysmal atrial fibrillation) ([place]) CARDIOLOGIST -- DR [name]   DX OCT 2013  . 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 Unspecified essential hypertension   . Urethral tumor    PROSTATIC    Tobacco History: Social History   Tobacco Use  Smoking Status Former Smoker  . Packs/day: 0.50  . Years: 27.00  . Pack years: 13.50  . Types: Cigarettes  . Last attempt to quit: 10/21/1979  . Years since quitting: 38.2  Smokeless Tobacco Never Used   Counseling given: Not Answered   Outpatient Encounter Medications as of 01/21/2018  Medication Sig  . acetaminophen (TYLENOL) 500 MG tablet Take 500 mg by mouth every 6 (six) hours as needed for moderate pain.  Marland Kitchen albuterol (VENTOLIN HFA) 108 (90 Base) MCG/ACT inhaler INHALE 2 PUFFS INTO LUNGS EVERY 6 HOURS AS NEEDED FOR WHEEZING OR SHORTNESS OF BREATH  . aspirin 81 MG EC tablet Take 1 tablet (81 mg total) by mouth daily.  Marland Kitchen azelastine (ASTELIN) 137 MCG/SPRAY nasal spray Place 1-2 sprays into the nose 2 (two) times daily as needed for rhinitis or allergies.   . benzonatate (TESSALON) 200 MG capsule Take 1 capsule (200 mg total) by mouth every 6 (six) hours as needed for cough.  . Cholecalciferol (VITAMIN D-3) 5000 UNITS TABS Take 5,000  Units by mouth daily with breakfast.   . COLCRYS 0.6 MG tablet Take 0.6 mg by mouth daily. With breakfast  . Cyanocobalamin (VITAMIN B12 SL) Place 1,200 mcg under the tongue daily. 1 dropper  . dextromethorphan (DELSYM) 30 MG/5ML liquid Take 30 mg by mouth at bedtime as needed for cough.   Marland Kitchen DHEA 50 MG TABS Take 50 mg by mouth daily with breakfast.   . finasteride (PROSCAR) 5 MG tablet Take 5 mg by mouth at bedtime.   . Hypromellose (ALZAIR ALLERGY NASAL SPRAY NA) Place 1 spray into both nostrils daily as needed (for allergies).   Marland Kitchen levothyroxine (SYNTHROID, LEVOTHROID) 112 MCG tablet Take 112 mcg by mouth daily before breakfast.   . liothyronine (CYTOMEL) 25 MCG tablet Take 25 mcg by mouth every morning.    . loratadine (CLARITIN) 10 MG tablet Take 10 mg by mouth daily.  . Menthol, Topical Analgesic, (BLUE-EMU MAXIMUM STRENGTH EX) Apply 1 application topically 4 (four) times daily as needed (for arthritis pain.).  Marland Kitchen metoprolol tartrate (LOPRESSOR) 50 MG tablet TAKE 1 TABLET(50 MG) BY MOUTH TWICE DAILY  . Multiple Vitamins-Minerals (ONE-A-DAY MENS HEALTH FORMULA PO) Take 1 tablet by mouth daily with breakfast.   . omeprazole (PRILOSEC) 40 MG capsule Take 1 capsule (40 mg total) by mouth 2 (two) times daily.  Marland Kitchen perphenazine (TRILAFON) 2 MG tablet Take 2 mg by mouth 2 (two) times daily as needed (for migraines).   . Probiotic Product (ALIGN) 4 MG CAPS Take 4 mg by mouth daily with breakfast.   . rivaroxaban (XARELTO) 20 MG TABS tablet Take 1 tablet (20 mg total) daily with supper by mouth.  . tamsulosin (FLOMAX) 0.4 MG CAPS capsule Take 0.4 mg by mouth daily after supper.  . testosterone cypionate (DEPOTESTOTERONE CYPIONATE) 100 MG/ML injection Inject 200 mg into the muscle every Thursday. For IM use only   . tetrahydrozoline-zinc (VISINE-AC) 0.05-0.25 % ophthalmic solution Place 1 drop into both eyes as needed (dry eyes).  Marland Kitchen umeclidinium-vilanterol (ANORO ELLIPTA) 62.5-25 MCG/INH AEPB Inhale 1 puff into the lungs daily.  Marland Kitchen venlafaxine (EFFEXOR-XR) 150 MG 24 hr capsule Take 150 mg by mouth every morning.    No facility-administered encounter medications on file as of 01/21/2018.      Review of Systems  Constitutional:   No  weight loss, night sweats,  Fevers, chills, fatigue, or  lassitude.  HEENT:   No headaches,  Difficulty swallowing,  Tooth/dental problems, or  Sore throat,                No sneezing, itching, ear ache, nasal congestion, post nasal drip,   CV:  No chest pain,  Orthopnea, PND, swelling in lower extremities, anasarca, dizziness, palpitations, syncope.   GI  No heartburn, indigestion, abdominal pain, nausea, vomiting, diarrhea, change in bowel habits, loss of appetite, bloody  stools.   Resp: No shortness of breath with exertion or at rest.  No excess mucus, no productive cough,  No non-productive cough,  No coughing up of blood.  No change in color of mucus.  No wheezing.  No chest wall deformity  Skin: no rash or lesions.  GU: no dysuria, change in color of urine, no urgency or frequency.  No flank pain, no hematuria   MS:  No joint pain or swelling.  No decreased range of motion.  No back pain.    Physical Exam  BP 120/72 (BP Location: Left Arm, Cuff Size: Normal)   Pulse (!) 57   Ht 5'  11" (1.803 m)   Wt 272 lb (123.4 kg)   SpO2 96%   BMI 37.94 kg/m   GEN: A/Ox3; pleasant , NAD, well nourished    HEENT:  /AT,  EACs-clear, TMs-wnl, NOSE-clear, THROAT-clear, no lesions, no postnasal drip or exudate noted. Tongue is midline with no obvious swelling . No oral mucosa swelling . No periorbital edema   NECK:  Supple w/ fair ROM; no JVD; normal carotid impulses w/o bruits; no thyromegaly or nodules palpated; no lymphadenopathy.  No stridor   RESP  Clear  P & A; w/o, wheezes/ rales/ or rhonchi. no accessory muscle use, no dullness to percussion. Speaks in full sentences   CARD:  RRR, no m/r/g, no peripheral edema, pulses intact, no cyanosis or clubbing.  GI:   Soft & nt; nml bowel sounds; no organomegaly or masses detected.   Musco: Warm bil, no deformities or joint swelling noted.   Neuro: alert, no focal deficits noted.    Skin: Warm, no lesions or rashes    Lab Results:   BNP No results found for: BNP  ProBNP No results found for: PROBNP  Imaging: Ct Angio Chest Aorta W/cm &/or Wo/cm  Result Date: 01/13/2018 CLINICAL DATA:  Thoracic aortic aneurysm without rupture. EXAM: CT ANGIOGRAPHY CHEST WITH CONTRAST TECHNIQUE: Multidetector CT imaging of the chest was performed using the standard protocol during bolus administration of intravenous contrast. Multiplanar CT image reconstructions and MIPs were obtained to evaluate the vascular  anatomy. CONTRAST:  53mL ISOVUE-370 IOPAMIDOL (ISOVUE-370) INJECTION 76% COMPARISON:  CT scan of May 12, 2017. FINDINGS: Cardiovascular: Status post aortic valve replacement as well as surgical repair of ascending thoracic aortic aneurysm. No dissection or aneurysm formation is noted at this time. Great vessels are widely patent without significant stenosis. Mediastinum/Nodes: No enlarged mediastinal, hilar, or axillary lymph nodes. Thyroid gland, trachea, and esophagus demonstrate no significant findings. Lungs/Pleura: Lungs are clear. No pleural effusion or pneumothorax. Upper Abdomen: No acute abnormality. Musculoskeletal: No chest wall abnormality. No acute or significant osseous findings. Review of the MIP images confirms the above findings. IMPRESSION: Status post surgical repair of ascending thoracic aortic aneurysm and aortic valve replacement. No thoracic aortic dissection or aneurysm is seen at this time. Electronically Signed   By: Marijo Conception, M.D.   On: 01/13/2018 10:16     Assessment & Plan:   Allergic reaction ? Allergic reaction /Angioedema . Subjective sx of facial swelling and tongue thickness . Appears stable with VSS , O2 sats 96% on room air .  ? Etiology /recurrence . Finasteride and Trilafon do have angioedema as potential side effects.  Advised to discuss with PCP .  Case discussed with Dr. Annamaria Boots   Will add Zyrtec/Pepcid Lenard Galloway , solumedrol injection . Unable to tolerate oral steorids   Plan  Patient Instructions  Begin Zyrtec 10mg   (in place of Claritin ) daily for 1 week.  Begin Pepcid 20mg  At bedtime  For 1 week .  May use benadryl As needed   Steroid injection today .  Discuss with primary MD that Finasteride and Trilafon that angioedema is possible with these meds.  Follow up with Dr. Annamaria Boots  In 6 weeks and As needed   Please contact office for sooner follow up if symptoms do not improve or worsen or seek emergency care    If recurrs will consider refer to  allergist, Dr. Carmelina Peal.       Rexene Edison, NP 01/21/2018

## 2018-01-21 NOTE — Patient Instructions (Addendum)
Begin Zyrtec 10mg   (in place of Claritin ) daily for 1 week.  Begin Pepcid 20mg  At bedtime  For 1 week .  May use benadryl As needed   Steroid injection today .  Discuss with primary MD that Finasteride and Trilafon that angioedema is possible with these meds.  Follow up with Dr. Annamaria Boots  In 6 weeks and As needed   Please contact office for sooner follow up if symptoms do not improve or worsen or seek emergency care

## 2018-01-21 NOTE — Telephone Encounter (Signed)
Agree- ED visit for this

## 2018-01-21 NOTE — Telephone Encounter (Signed)
lmtcb to make pt aware of CY's recommendations, as it does not appear that pt has checked in to ED within [facility] as of yet.

## 2018-01-21 NOTE — Telephone Encounter (Signed)
Patient wife Tye Maryland calling back -  She can be reached at [phone] -pr

## 2018-01-21 NOTE — Assessment & Plan Note (Signed)
?   Allergic reaction /Angioedema . Subjective sx of facial swelling and tongue thickness . Appears stable with VSS , O2 sats 96% on room air .  ? Etiology /recurrence . Finasteride and Trilafon do have angioedema as potential side effects.  Advised to discuss with PCP .  Case discussed with Dr. Annamaria Boots   Will add Zyrtec/Pepcid Lenard Galloway , solumedrol injection . Unable to tolerate oral steorids   Plan  Patient Instructions  Begin Zyrtec 10mg   (in place of Claritin ) daily for 1 week.  Begin Pepcid 20mg  At bedtime  For 1 week .  May use benadryl As needed   Steroid injection today .  Discuss with primary MD that Finasteride and Trilafon that angioedema is possible with these meds.  Follow up with Dr. Annamaria Boots  In 6 weeks and As needed   Please contact office for sooner follow up if symptoms do not improve or worsen or seek emergency care    If recurrs will consider refer to allergist, Dr. Carmelina Peal.

## 2018-01-21 NOTE — Telephone Encounter (Signed)
Offer prednisone 20 mg # 4, 1 daily  And recommend daily antihistamine like benadryl or allegra until this goes down. If it gets worse it may interfere with breathing, in which case he does need to go to ER.

## 2018-01-21 NOTE — Telephone Encounter (Signed)
Called and spoke with patient regarding swollen face Pt reports swollen face, swollen tongue and reports it is thick, having trouble speaking and breathing, and coughing more. Advised patient to go to closest ED and call EMS  Routing message to [facility] for review

## 2018-01-21 NOTE — Telephone Encounter (Signed)
Called and spoke with pt.  Pt declined prednisone, as he would like a depo injection.   Pt has been scheduled today with TP at 2:15-okay with cy. Nothing further is needed.

## 2018-01-26 DIAGNOSIS — M25512 Pain in left shoulder: Secondary | ICD-10-CM | POA: Insufficient documentation

## 2018-02-03 ENCOUNTER — Other Ambulatory Visit: Payer: Self-pay | Admitting: Internal Medicine

## 2018-02-04 DIAGNOSIS — Z0000 Encounter for general adult medical examination without abnormal findings: Secondary | ICD-10-CM | POA: Diagnosis not present

## 2018-02-04 DIAGNOSIS — E291 Testicular hypofunction: Secondary | ICD-10-CM | POA: Diagnosis not present

## 2018-02-04 DIAGNOSIS — I10 Essential (primary) hypertension: Secondary | ICD-10-CM | POA: Diagnosis not present

## 2018-02-04 DIAGNOSIS — E039 Hypothyroidism, unspecified: Secondary | ICD-10-CM | POA: Diagnosis not present

## 2018-02-22 NOTE — Progress Notes (Signed)
Cardiology Office Note    Date:  02/26/2018   ID:  Grant [name], DOB June 12, 1951, MRN [id number]  PCP:  Shirline Frees, MD  Cardiologist: Dr. [name]  EP: Dr. [name]  Chief Complaint: Hospital follow up for afib RVR  History of Present Illness:   Grant [name] is a 67 y.o. male with paroxysmal atrial fibrillation, hypertension, OSA on CPAPand bicuspid aortic valve s/p Bentall and bioprosthetic (10mm Edwards pericardial Magna Ease)  No CAD at cath done 06/12/17 prior to surgery   H/o bicuspid aortic valve w/ aortic insuffiencey and dilated aortic root, normal coronaries by cath and normal LVEF s/p  Bentall Procedure by Dr. Prescott Gum 07/02/17 with 28 mm graft and 25 mm edwards pericardial magna ease valve  Post operative course was complicated by atrial fibrillation w/ RVR, treated with amiodarone and coumadin. He also had post-operative delirium. He was discharged home on 07/10/17 and was in NSR by time of discharge. He was seen by CT surgery on 07/15/17 and still having issues with delirium, which was felt related to amiodarone. This was discontinued and he was continued on Coreg. Delirium improved but may have been from tramadol not amiodarone  Had recurrent PAF 07/28/17 converted with iv cardizem and seen by EP Dr [name] who recommended amiodarone 200 mg daily and changed back to  xarelto for anticoagulation   January and April had what appears to be angioedema. In January noted tongue swelling seen [facility] ER and given steroids and benadryl  Was not On ACE or ARB He does not tolerate oral steroids and given zyrtec pepcid and steroid injection Told to discuss with primary stopping Tilafon (antipsychoitic)  and Proscar which have been implicated in angioedema  Echo 08/28/17 EF 50-55% Tissue AVR mean gradient 14 mmHg peak 24 mmHg no AR Mild RAE normal LA size   He has f/u with allergist to see what cause could be only takes trilafon PRN for headaches Is on flomax so should be ok to  stop proscar. Had recurrence of angioedema off amiodarone   Past Medical History:  Diagnosis Date  . Allergic rhinitis   . Anxiety   . Arthritis   . Asthma   . Bladder tumor   . Chronic fatigue   . Chronic fatigue   . Depression    06/30/17 Pt denies being depressed, reports Effexor is taken for Chronic Fatigue   . Dyspnea   . Enlarged prostate   . Fibromyalgia   . GERD (gastroesophageal reflux disease)   . Headache   . History of chronic bronchitis   . History of migraine   . History of toxic encephalopathy   . Hypothyroidism   . OSA on CPAP    CPAP 14  . PAF (paroxysmal atrial fibrillation) ([place]) CARDIOLOGIST -- DR [name]   DX OCT 2013  . 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 Unspecified essential hypertension   . Urethral tumor    PROSTATIC    Past Surgical History:  Procedure Laterality Date  . BENTALL PROCEDURE N/A 07/02/2017   Procedure: BENTALL PROCEDURE;  Surgeon: Ivin Poot, MD;  Location: [place];  Service: Open Heart Surgery;  Laterality: N/A;  WITH CIRC ARREST  . COLONOSCOPY    . CYSTOSCOPY W/ RETROGRADES Bilateral 06/15/2013   Procedure: CYSTOSCOPY WITH BILATERAL RETROGRADE PYELOGRAM  BLADDER BIOPSY, PROSTATIC URETHRAL BIOPSY, ;  Surgeon: Molli Hazard, MD;  Location: [facility];  Service: Urology;  Laterality: Bilateral;  . LAPAROSCOPIC CHOLECYSTECTOMY  01-14-2001  .  RIGHT/LEFT HEART CATH AND CORONARY ANGIOGRAPHY N/A 06/12/2017   Procedure: RIGHT/LEFT HEART CATH AND CORONARY ANGIOGRAPHY;  Surgeon: Larey Dresser, MD;  Location: [place] [facility];  Service: Cardiovascular;  Laterality: N/A;  . TEE WITHOUT CARDIOVERSION N/A 07/02/2017   Procedure: TRANSESOPHAGEAL ECHOCARDIOGRAM (TEE);  Surgeon: Prescott Gum, Collier Salina, MD;  Location: [place];  Service: Open Heart Surgery;  Laterality: N/A;  . UMBILICAL HERNIA REPAIR  01-14-2008    Current Medications: Prior to Admission medications   Medication Sig Start Date End Date Taking? Authorizing Provider    acetaminophen (TYLENOL) 500 MG tablet Take 500 mg by mouth every 6 (six) hours as needed for moderate pain.    [provider]  albuterol (VENTOLIN HFA) 108 (90 Base) MCG/ACT inhaler INHALE 2 PUFFS INTO LUNGS EVERY 6 HOURS AS NEEDED FOR WHEEZING OR SHORTNESS OF BREATH 01/19/17   Baird Lyons D, MD  amiodarone (PACERONE) 200 MG tablet Take 1 tablet (200 mg total) by mouth daily. 07/29/17   Leanor Kail, PA  aspirin 81 MG EC tablet Take 1 tablet (81 mg total) by mouth daily. 07/13/17   Gold, Wilder Glade, PA-C  azelastine (ASTELIN) 137 MCG/SPRAY nasal spray Place 1-2 sprays into the nose 2 (two) times daily as needed for rhinitis or allergies.     [provider]  benzonatate (TESSALON) 200 MG capsule Take 1 capsule (200 mg total) by mouth every 6 (six) hours as needed for cough. 11/19/16   Baird Lyons D, MD  budesonide-formoterol (SYMBICORT) 160-4.5 MCG/ACT inhaler INHALE 2 PUFF INTO THE LUNGS 2 TIMES A DAY. 01/19/17   Baird Lyons D, MD  Cholecalciferol (VITAMIN D-3) 5000 UNITS TABS Take 5,000 Units by mouth daily with breakfast.     [provider]  COLCRYS 0.6 MG tablet Take 0.6 mg by mouth daily. With breakfast 01/29/12   [provider]  Cyanocobalamin (VITAMIN B12 SL) Place 1,200 mcg under the tongue daily. 1 dropper    [provider]  dextromethorphan (DELSYM) 30 MG/5ML liquid Take 30 mg by mouth at bedtime as needed for cough.     [provider]  DHEA 50 MG TABS Take 50 mg by mouth daily with breakfast.     [provider]  fexofenadine (ALLEGRA) 180 MG tablet Take 180 mg by mouth daily with breakfast.     [provider]  finasteride (PROSCAR) 5 MG tablet Take 5 mg by mouth at bedtime.  01/14/14   [provider]  Hypromellose Vertell Limber ALLERGY NASAL SPRAY NA) Place 1 spray into both nostrils daily as needed (for allergies).     [provider]  levothyroxine (SYNTHROID, LEVOTHROID) 112 MCG tablet Take  112 mcg by mouth daily before breakfast.     [provider]  liothyronine (CYTOMEL) 25 MCG tablet Take 25 mcg by mouth every morning.     [provider]  magic mouthwash w/lidocaine SOLN Take 10 mLs by mouth 4 (four) times daily as needed for mouth pain. 07/16/17   Deneise Lever, MD  Menthol, Topical Analgesic, (BLUE-EMU MAXIMUM STRENGTH EX) Apply 1 application topically 4 (four) times daily as needed (for arthritis pain.).    [provider]  metoprolol tartrate (LOPRESSOR) 50 MG tablet Take 1 tablet (50 mg total) by mouth 2 (two) times daily. 07/29/17   Bhagat, Bhavinkumar, PA  milk thistle 175 MG tablet Take 175 mg by mouth daily.    [provider]  Multiple Vitamins-Minerals (ONE-A-DAY MENS HEALTH FORMULA PO) Take 1 tablet by  mouth daily with breakfast.     [provider]  omeprazole (PRILOSEC) 40 MG capsule Take 1 capsule (40 mg total) by mouth 2 (two) times daily. 10/31/16   Josue Hector, MD  perphenazine (TRILAFON) 2 MG tablet Take 2 mg by mouth 2 (two) times daily as needed (for migraines).     [provider]  Probiotic Product (ALIGN) 4 MG CAPS Take 4 mg by mouth daily with breakfast.     [provider]  tamsulosin (FLOMAX) 0.4 MG CAPS capsule Take 0.4 mg by mouth daily after supper.    [provider]  testosterone cypionate (DEPOTESTOTERONE CYPIONATE) 100 MG/ML injection Inject 200 mg into the muscle every Thursday. For IM use only     [provider]  tetrahydrozoline-zinc (VISINE-AC) 0.05-0.25 % ophthalmic solution Place 1 drop into both eyes as needed (dry eyes).    [provider]  venlafaxine (EFFEXOR-XR) 150 MG 24 hr capsule Take 150 mg by mouth every morning.     [provider]  warfarin (COUMADIN) 2.5 MG tablet Take 1 tablet (2.5 mg total) by mouth daily at 6 PM. Take 7.5mg  (3 tablet tonight) and 5mg  tomorrow and INR check Friday Patient taking differently: Take 2.5 mg by  mouth daily at 6 PM. Take 7.5mg  (3 tablet tonight) and 5mg  tomorrow and INR check Friday and as directed 07/29/17   Leanor Kail, PA    Allergies:   Amiodarone; Codeine; and Tramadol   Social History   Socioeconomic History  . Marital status: Married    Spouse name: Not on file  . Number of children: Not on file  . Years of education: Not on file  . Highest education level: Not on file  Occupational History  . Occupation: peanut selling    Comment: owner of buisness  Social Needs  . Financial resource strain: Not on file  . Food insecurity:    Worry: Not on file    Inability: Not on file  . Transportation needs:    Medical: Not on file    Non-medical: Not on file  Tobacco Use  . Smoking status: Former Smoker    Packs/day: 0.50    Years: 27.00    Pack years: 13.50    Types: Cigarettes    Last attempt to quit: 10/21/1979    Years since quitting: 38.3  . Smokeless tobacco: Never Used  Substance and Sexual Activity  . Alcohol use: Yes    Alcohol/week: 12.6 oz    Types: 21 Cans of beer per week    Comment: 4-5 beers a day  . Drug use: No  . Sexual activity: Not on file  Lifestyle  . Physical activity:    Days per week: Not on file    Minutes per session: Not on file  . Stress: Not on file  Relationships  . Social connections:    Talks on phone: Not on file    Gets together: Not on file    Attends religious service: Not on file    Active member of club or organization: Not on file    Attends meetings of clubs or organizations: Not on file    Relationship status: Not on file  Other Topics Concern  . Not on file  Social History Narrative  . Not on file     Family History:  The patient's family history includes Aortic aneurysm (age of onset: 24) in his mother; Heart disease in his unknown relative; Other in his father.  ROS:   Please see the history of present illness.    ROS All other systems reviewed and are negative.   PHYSICAL EXAM:   VS:  BP 118/80    Pulse 74   Ht 5\' 11"  (1.803 m)   Wt 270 lb 8 oz (122.7 kg)   SpO2 98%   BMI 37.73 kg/m    Affect appropriate Obese white male  HEENT: normal Neck supple with no adenopathy JVP normal no bruits no thyromegaly Lungs clear with no wheezing and good diaphragmatic motion Heart:  S1/S2 SEM through AVR no AR  murmur, no rub, gallop or click PMI normal post sternotomy  Abdomen: benighn, BS positve, no tenderness, no AAA no bruit.  No HSM or HJR Distal pulses intact with no bruits No edema Neuro non-focal Skin warm and dry No muscular weakness   Wt Readings from Last 3 Encounters:  02/26/18 270 lb 8 oz (122.7 kg)  01/21/18 272 lb (123.4 kg)  01/13/18 265 lb (120.2 kg)      Studies/Labs Reviewed:   EKG:  EKG is ordered today.  The ekg ordered today demonstrates sinus rhythm with T wave inversion in inferior lead and lead V6.  Recent Labs: 06/30/2017: ALT 24 07/28/2017: Hemoglobin 12.1; Platelets 235; TSH 1.910 07/29/2017: BUN 11; Creatinine, Ser 0.84; Magnesium 1.7; Potassium 4.1; Sodium 140   Lipid Panel    Component Value Date/Time   CHOL 184 05/05/2017 0931   TRIG 225 (H) 05/05/2017 0931   HDL 45 05/05/2017 0931   CHOLHDL 4.1 05/05/2017 0931   LDLCALC 94 05/05/2017 0931    Additional studies/ records that were reviewed today include:  As above   ASSESSMENT & PLAN:    PAF- on xarelto amiodarone stopped   2. HTN - Stable on current regimen   3. 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 Prescott Gum 07/02/17. Post op echo with good Gradients and no leak EF normal reviewed echo from 08/28/17  Amoxacillin called in for SBE  4. OSA -Compliant with CPAP.  5. Angioedema:  Recurrent January and April of this year Not on ACE/ARB Called in 2 epi pens for him has f/u with allergist cannot take oral prednisone due to MS changes    [name]

## 2018-02-24 ENCOUNTER — Emergency Department (HOSPITAL_COMMUNITY)
Admission: EM | Admit: 2018-02-24 | Discharge: 2018-02-24 | Discharge status: Against Medical Advice | Payer: Medicare Other

## 2018-02-24 DIAGNOSIS — T783XXA Angioneurotic edema, initial encounter: Secondary | ICD-10-CM | POA: Diagnosis not present

## 2018-02-24 NOTE — ED Notes (Signed)
Pt called x3 for triage; no response.

## 2018-02-26 ENCOUNTER — Encounter: Payer: Self-pay | Admitting: Cardiovascular Disease

## 2018-02-26 ENCOUNTER — Ambulatory Visit: Payer: Medicare Other | Admitting: Cardiovascular Disease

## 2018-02-26 VITALS — BP 118/80 | HR 74 | Ht 71.0 in | Wt 270.5 lb

## 2018-02-26 DIAGNOSIS — I480 Paroxysmal atrial fibrillation: Secondary | ICD-10-CM | POA: Diagnosis not present

## 2018-02-26 DIAGNOSIS — Z952 Presence of prosthetic heart valve: Secondary | ICD-10-CM | POA: Diagnosis not present

## 2018-02-26 DIAGNOSIS — I10 Essential (primary) hypertension: Secondary | ICD-10-CM

## 2018-02-26 DIAGNOSIS — I351 Nonrheumatic aortic (valve) insufficiency: Secondary | ICD-10-CM

## 2018-02-26 MED ORDER — EPINEPHRINE 0.3 MG/0.3ML IJ SOAJ
0.3000 mg | INTRAMUSCULAR | 0 refills | Status: DC | PRN
Start: 2018-02-26 — End: 2022-08-18

## 2018-02-26 MED ORDER — AMOXICILLIN 500 MG PO TABS: ORAL_TABLET | ORAL | 0 refills | Status: DC

## 2018-02-26 MED ORDER — EPINEPHRINE 0.3 MG/0.3ML IJ SOAJ: 0.3000 mg | INTRAMUSCULAR | 1 refills | Status: DC | PRN

## 2018-02-26 NOTE — Patient Instructions (Addendum)

## 2018-03-03 DIAGNOSIS — R972 Elevated prostate specific antigen [PSA]: Secondary | ICD-10-CM | POA: Diagnosis not present

## 2018-03-05 ENCOUNTER — Ambulatory Visit (INDEPENDENT_AMBULATORY_CARE_PROVIDER_SITE_OTHER): Payer: Medicare Other | Admitting: Internal Medicine

## 2018-03-05 ENCOUNTER — Encounter: Payer: Self-pay | Admitting: Internal Medicine

## 2018-03-05 VITALS — BP 124/78 | HR 66 | Ht 71.0 in | Wt 265.8 lb

## 2018-03-05 DIAGNOSIS — T783XXS Angioneurotic edema, sequela: Secondary | ICD-10-CM

## 2018-03-05 DIAGNOSIS — G4733 Obstructive sleep apnea (adult) (pediatric): Secondary | ICD-10-CM

## 2018-03-05 DIAGNOSIS — T783XXD Angioneurotic edema, subsequent encounter: Secondary | ICD-10-CM

## 2018-03-05 DIAGNOSIS — J410 Simple chronic bronchitis: Secondary | ICD-10-CM

## 2018-03-05 NOTE — Progress Notes (Signed)
History of Present Illness: . male former smoker with asthma/chronic bronchitis and allergic rhinitis, OSA, angioedema,  PAF/ anticoagulation, aortic valve replacement, toxic encephalopathy, light sensitivity, chronic fatigue/fibromyalgia NPSG 03/24/03- AHI 88/ hr, desaturation to 75%, body weight 244 lbs PFT 06/01/17-WNL-FVC 4.46/96%, FEV1 3.71/106%, ratio 0.83, FEF 25-75% 4.64/167%, no response to dilator, TLC 107%, DLCO 101%  ------------------------------------------------------------------ 11/05/17- 67 year old male former smoker followed for asthma/chronic bronchitis and allergic rhinitis,  OSA,  complicated by PAF/ anticoagulation, aortic valve replacemnt, toxic encephalopathy, light sensitivity, chronic fatigue/fibromyalgia CPAP 12/[facility] ----CPAP follow up, ongoing congestion, post nasal drip, wet cough.  Download 100% compliance, AHI 0.7/hour. "I love my CPAP". He says everything is doing better since he had his valve replacement.  Rarely needs rescue inhaler now.  Has some days of increased shortness of breath occasionally during which he would use Symbicort but finds if he uses that, or Advair, for more than a couple of weeks at a time he gets a dry cough. Rhinitis with postnasal drip treated with Flonase or Astelin.  Both cause nosebleeds if used routinely. Some increase in cough recently he thinks is related to reflux.  Continues omeprazole twice daily but never raised the head of his bed. CXR-08/05/17 1. No acute cardiopulmonary abnormalities.  03/05/2018- 67 year old male former smoker followed for asthma/chronic bronchitis and allergic rhinitis,  OSA, angioedema, complicated by PAF/ anticoagulation, aortic valve replacement, toxic encephalopathy, light sensitivity, chronic fatigue/fibromyalgia CPAP 12/[facility] ----OSA; DME [facility]. Pt wears CPAP and DL attached. Will need order for new supplies. Pt denies any trouble with breathing at this time. LOV here 01/21/2018 for questionable  allergic reaction/angioedema treated with Zyrtec, Pepcid, benadryl. Since then delirium ? And another episode reported as angioedema, off amiodarone.  Cardiology recommended he keep an Epipen. He avoids oral steroids because of mental status changes. He questions if a migraine medicine might have been the trigger for his episodes and has stopped using it.  Wife says she can see his face looks swollen when he complains of swelling and discomfort in his tongue.  He stopped Anoro to see if that was a factor, going back to Symbicort. Doing well with CPAP.  [place] on [place] local machine reads 47%, but they assure me he uses CPAP every night.  AHI 1.7/hou no recent wheezing.  ROS-see HPI     + = positive Constitutional:   No-   weight loss, night sweats, fevers, chills, fatigue, lassitude. HEENT:   No-  headaches, difficulty swallowing, tooth/dental problems,  sore throat,       No-  sneezing, itching, ear ache,                    No-nasal congestion, + post nasal drip,  CV:  No-   chest pain, orthopnea, PND, swelling in lower extremities, anasarca,  dizziness, palpitations Resp: No- coughing up of blood.              No-   change in color of mucus.  No- wheezing.   Skin: Clear GI:  + heartburn, indigestion, abdominal pain, nausea, vomiting,  GU:  MS:  No-   joint pain or swelling.   Neuro-     nothing unusual Psych:  No- change in mood or affect. + depression or anxiety.  No memory loss.  OBJ- Physical Exam General- Alert, Oriented, Affect-appropriate, Distress- none acute. +Sunglasses, + overweight Skin- +eczematoid patches on lower calves, nonspecific, excoriated Lymphadenopathy- none Head- atraumatic. Stares aimlessly around  room like a blind person.             Eyes- Gross vision intact, PERRLA, conjunctivae and secretions clear, + sunglasses            Ears- Hearing, canals-normal            Nose- Clear, no-Septal dev, mucus, polyps, erosion,  perforation             Throat- Mallampati III-IV , mucosa +red , drainage- none, tonsils- atrophic Neck- flexible , trachea midline, no stridor , thyroid nl, carotid no bruit Chest - symmetrical excursion , unlabored           Heart/CV- RRR , no murmur , no gallop  , no rub, nl s1 s2, + slight click                           - JVD- none , edema- none, stasis changes- none, varices- none           Lung- + clear/ unlabored, wheeze- none, cough-none, dullness-none, rub- none           Chest wall-  Abd-  Br/ Gen/ Rectal- Not done, not indicated Extrem- cyanosis- none, clubbing, none, atrophy- none, strength- nl Neuro- grossly intact to observation

## 2018-03-05 NOTE — Patient Instructions (Signed)
Order- referral to [facility] on [address]    Dx recurrent angioedema  Order- DME [facility]- Please replace mask of choice and supplies. Continue CPAP 12, mask of choice, humidifier, supplies, AirView

## 2018-03-07 ENCOUNTER — Inpatient Hospital Stay (HOSPITAL_COMMUNITY)
Admission: EM | Admit: 2018-03-07 | Discharge: 2018-03-11 | DRG: 872 | Disposition: A | Discharge status: Home / Self Care | Payer: Medicare Other | Attending: Internal Medicine | Admitting: Internal Medicine

## 2018-03-07 ENCOUNTER — Emergency Department (HOSPITAL_COMMUNITY): Payer: Medicare Other

## 2018-03-07 ENCOUNTER — Encounter (HOSPITAL_COMMUNITY): Payer: Self-pay | Admitting: Emergency Medicine

## 2018-03-07 DIAGNOSIS — I10 Essential (primary) hypertension: Secondary | ICD-10-CM | POA: Diagnosis present

## 2018-03-07 DIAGNOSIS — D509 Iron deficiency anemia, unspecified: Secondary | ICD-10-CM | POA: Diagnosis not present

## 2018-03-07 DIAGNOSIS — E039 Hypothyroidism, unspecified: Secondary | ICD-10-CM | POA: Diagnosis present

## 2018-03-07 DIAGNOSIS — Z79890 Hormone replacement therapy (postmenopausal): Secondary | ICD-10-CM

## 2018-03-07 DIAGNOSIS — N400 Enlarged prostate without lower urinary tract symptoms: Secondary | ICD-10-CM | POA: Diagnosis not present

## 2018-03-07 DIAGNOSIS — R079 Chest pain, unspecified: Secondary | ICD-10-CM | POA: Diagnosis not present

## 2018-03-07 DIAGNOSIS — J45909 Unspecified asthma, uncomplicated: Secondary | ICD-10-CM | POA: Diagnosis present

## 2018-03-07 DIAGNOSIS — R509 Fever, unspecified: Secondary | ICD-10-CM | POA: Diagnosis present

## 2018-03-07 DIAGNOSIS — K219 Gastro-esophageal reflux disease without esophagitis: Secondary | ICD-10-CM | POA: Diagnosis present

## 2018-03-07 DIAGNOSIS — Z952 Presence of prosthetic heart valve: Secondary | ICD-10-CM

## 2018-03-07 DIAGNOSIS — G4733 Obstructive sleep apnea (adult) (pediatric): Secondary | ICD-10-CM | POA: Diagnosis present

## 2018-03-07 DIAGNOSIS — Z7982 Long term (current) use of aspirin: Secondary | ICD-10-CM | POA: Diagnosis not present

## 2018-03-07 DIAGNOSIS — K5792 Diverticulitis of intestine, part unspecified, without perforation or abscess without bleeding: Secondary | ICD-10-CM | POA: Diagnosis not present

## 2018-03-07 DIAGNOSIS — Z7901 Long term (current) use of anticoagulants: Secondary | ICD-10-CM | POA: Diagnosis not present

## 2018-03-07 DIAGNOSIS — R933 Abnormal findings on diagnostic imaging of other parts of digestive tract: Secondary | ICD-10-CM | POA: Diagnosis not present

## 2018-03-07 DIAGNOSIS — Z7951 Long term (current) use of inhaled steroids: Secondary | ICD-10-CM

## 2018-03-07 DIAGNOSIS — Z87891 Personal history of nicotine dependence: Secondary | ICD-10-CM

## 2018-03-07 DIAGNOSIS — Z6836 Body mass index (BMI) 36.0-36.9, adult: Secondary | ICD-10-CM

## 2018-03-07 DIAGNOSIS — K317 Polyp of stomach and duodenum: Secondary | ICD-10-CM | POA: Diagnosis not present

## 2018-03-07 DIAGNOSIS — Z9989 Dependence on other enabling machines and devices: Secondary | ICD-10-CM

## 2018-03-07 DIAGNOSIS — I480 Paroxysmal atrial fibrillation: Secondary | ICD-10-CM | POA: Diagnosis not present

## 2018-03-07 DIAGNOSIS — Z9049 Acquired absence of other specified parts of digestive tract: Secondary | ICD-10-CM | POA: Diagnosis not present

## 2018-03-07 DIAGNOSIS — T783XXA Angioneurotic edema, initial encounter: Secondary | ICD-10-CM | POA: Insufficient documentation

## 2018-03-07 DIAGNOSIS — I493 Ventricular premature depolarization: Secondary | ICD-10-CM | POA: Diagnosis present

## 2018-03-07 DIAGNOSIS — M797 Fibromyalgia: Secondary | ICD-10-CM | POA: Diagnosis not present

## 2018-03-07 DIAGNOSIS — A419 Sepsis, unspecified organism: Principal | ICD-10-CM | POA: Diagnosis present

## 2018-03-07 DIAGNOSIS — R319 Hematuria, unspecified: Secondary | ICD-10-CM | POA: Diagnosis present

## 2018-03-07 DIAGNOSIS — I4891 Unspecified atrial fibrillation: Secondary | ICD-10-CM | POA: Diagnosis not present

## 2018-03-07 DIAGNOSIS — E669 Obesity, unspecified: Secondary | ICD-10-CM | POA: Diagnosis present

## 2018-03-07 DIAGNOSIS — K5730 Diverticulosis of large intestine without perforation or abscess without bleeding: Secondary | ICD-10-CM | POA: Diagnosis not present

## 2018-03-07 DIAGNOSIS — K5732 Diverticulitis of large intestine without perforation or abscess without bleeding: Secondary | ICD-10-CM | POA: Diagnosis not present

## 2018-03-07 DIAGNOSIS — D500 Iron deficiency anemia secondary to blood loss (chronic): Secondary | ICD-10-CM | POA: Diagnosis not present

## 2018-03-07 DIAGNOSIS — K921 Melena: Secondary | ICD-10-CM | POA: Diagnosis not present

## 2018-03-07 LAB — INFLUENZA PANEL BY PCR (TYPE A & B)
Influenza A By PCR: NEGATIVE
Influenza B By PCR: NEGATIVE

## 2018-03-07 LAB — URINALYSIS, ROUTINE W REFLEX MICROSCOPIC
Bacteria, UA: NONE SEEN
Bilirubin Urine: NEGATIVE
Glucose, UA: NEGATIVE mg/dL
Ketones, ur: NEGATIVE mg/dL
Leukocytes, UA: NEGATIVE
Nitrite: NEGATIVE
Protein, ur: NEGATIVE mg/dL
RBC / HPF: >50 RBC/hpf — ABNORMAL HIGH (ref 0–5)
Specific Gravity, Urine: 1.018 (ref 1.005–1.030)
pH: 5 (ref 5.0–8.0)

## 2018-03-07 LAB — COMPREHENSIVE METABOLIC PANEL
ALT: 22 U/L (ref 17–63)
AST: 24 U/L (ref 15–41)
Albumin: 3.7 g/dL (ref 3.5–5.0)
Alkaline Phosphatase: 60 U/L (ref 38–126)
Anion gap: 8 (ref 5–15)
BUN: 16 mg/dL (ref 6–20)
CO2: 26 mmol/L (ref 22–32)
Calcium: 8.9 mg/dL (ref 8.9–10.3)
Chloride: 107 mmol/L (ref 101–111)
Creatinine, Ser: 1.1 mg/dL (ref 0.61–1.24)
GFR calc Af Amer: >60 mL/min (ref >60)
GFR calc non Af Amer: >60 mL/min (ref >60)
Glucose, Bld: 102 mg/dL — ABNORMAL HIGH (ref 65–99)
Potassium: 4 mmol/L (ref 3.5–5.1)
Sodium: 141 mmol/L (ref 135–145)
Total Bilirubin: 0.8 mg/dL (ref 0.3–1.2)
Total Protein: 6.1 g/dL — ABNORMAL LOW (ref 6.5–8.1)

## 2018-03-07 LAB — CBC WITH DIFFERENTIAL/PLATELET
Abs Immature Granulocytes: 0.1 10*3/uL (ref 0.0–0.1)
Basophils Absolute: 0 10*3/uL (ref 0.0–0.1)
Basophils Relative: 0 %
Eosinophils Absolute: 0.1 10*3/uL (ref 0.0–0.7)
Eosinophils Relative: 1 %
HCT: 33 % — ABNORMAL LOW (ref 39.0–52.0)
Hemoglobin: 9.3 g/dL — ABNORMAL LOW (ref 13.0–17.0)
Immature Granulocytes: 1 %
Lymphocytes Relative: 5 %
Lymphs Abs: 0.7 10*3/uL (ref 0.7–4.0)
MCH: 22 pg — ABNORMAL LOW (ref 26.0–34.0)
MCHC: 28.2 g/dL — ABNORMAL LOW (ref 30.0–36.0)
MCV: 78.2 fL (ref 78.0–100.0)
Monocytes Absolute: 1.4 10*3/uL — ABNORMAL HIGH (ref 0.1–1.0)
Monocytes Relative: 11 %
Neutro Abs: 10.5 10*3/uL — ABNORMAL HIGH (ref 1.7–7.7)
Neutrophils Relative %: 82 %
Platelets: 204 10*3/uL (ref 150–400)
RBC: 4.22 MIL/uL (ref 4.22–5.81)
RDW: 17.6 % — ABNORMAL HIGH (ref 11.5–15.5)
WBC: 12.8 10*3/uL — ABNORMAL HIGH (ref 4.0–10.5)

## 2018-03-07 LAB — I-STAT TROPONIN, ED: Troponin i, poc: 0.01 ng/mL (ref 0.00–0.08)

## 2018-03-07 LAB — I-STAT CG4 LACTIC ACID, ED: Lactic Acid, Venous: 1.74 mmol/L (ref 0.5–1.9)

## 2018-03-07 MED ORDER — ACETAMINOPHEN 500 MG PO TABS
1000.0000 mg | ORAL_TABLET | Freq: Once | ORAL | Status: AC
  Administered 2018-03-07: 1000 mg via ORAL
  Filled 2018-03-07: qty 2

## 2018-03-07 MED ORDER — SODIUM CHLORIDE 0.9 % IV SOLN
INTRAVENOUS | Status: DC
  Administered 2018-03-08 – 2018-03-09 (×3): via INTRAVENOUS

## 2018-03-07 MED ORDER — VANCOMYCIN HCL IN DEXTROSE 750-5 MG/150ML-% IV SOLN
750.0000 mg | Freq: Two times a day (BID) | INTRAVENOUS | Status: DC
  Administered 2018-03-08: 750 mg via INTRAVENOUS
  Filled 2018-03-07 (×3): qty 150

## 2018-03-07 MED ORDER — VANCOMYCIN HCL IN DEXTROSE 1-5 GM/200ML-% IV SOLN: 1000.0000 mg | Freq: Once | INTRAVENOUS | Status: DC

## 2018-03-07 MED ORDER — FENTANYL CITRATE (PF) 100 MCG/2ML IJ SOLN
50.0000 ug | Freq: Once | INTRAMUSCULAR | Status: AC
  Administered 2018-03-07: 50 ug via INTRAVENOUS
  Filled 2018-03-07: qty 2

## 2018-03-07 MED ORDER — ACETAMINOPHEN 325 MG PO TABS
650.0000 mg | ORAL_TABLET | Freq: Four times a day (QID) | ORAL | Status: DC | PRN
  Administered 2018-03-08 – 2018-03-10 (×6): 650 mg via ORAL
  Filled 2018-03-07 (×6): qty 2

## 2018-03-07 MED ORDER — SODIUM CHLORIDE 0.9 % IV BOLUS
1000.0000 mL | Freq: Once | INTRAVENOUS | Status: AC
  Administered 2018-03-07: 1000 mL via INTRAVENOUS

## 2018-03-07 MED ORDER — IBUPROFEN 400 MG PO TABS
400.0000 mg | ORAL_TABLET | Freq: Once | ORAL | Status: AC
  Administered 2018-03-07: 400 mg via ORAL
  Filled 2018-03-07: qty 1

## 2018-03-07 MED ORDER — PIPERACILLIN-TAZOBACTAM 3.375 G IVPB
3.3750 g | Freq: Once | INTRAVENOUS | Status: AC
  Administered 2018-03-07: 3.375 g via INTRAVENOUS
  Filled 2018-03-07: qty 50

## 2018-03-07 MED ORDER — PIPERACILLIN-TAZOBACTAM 3.375 G IVPB
3.3750 g | Freq: Three times a day (TID) | INTRAVENOUS | Status: DC
  Administered 2018-03-08 – 2018-03-09 (×5): 3.375 g via INTRAVENOUS
  Filled 2018-03-07 (×6): qty 50

## 2018-03-07 MED ORDER — VANCOMYCIN HCL 10 G IV SOLR
1500.0000 mg | Freq: Once | INTRAVENOUS | Status: AC
  Administered 2018-03-07: 1500 mg via INTRAVENOUS
  Filled 2018-03-07: qty 1500

## 2018-03-07 MED ORDER — SODIUM CHLORIDE 0.9 % IV BOLUS
2000.0000 mL | Freq: Once | INTRAVENOUS | Status: AC
  Administered 2018-03-08: 2000 mL via INTRAVENOUS

## 2018-03-07 MED ORDER — PIPERACILLIN-TAZOBACTAM IN DEX 2-0.25 GM/50ML IV SOLN: 2.2500 g | Freq: Once | INTRAVENOUS | Status: DC

## 2018-03-07 NOTE — ED Triage Notes (Signed)
BIB EMS from home, pt had sudden onset of chills, "shaking" and central chest aching. Pt noted to be febrile during triage, pt was exposed to heat earlier in the day.

## 2018-03-07 NOTE — Assessment & Plan Note (Signed)
Excellent compliance and control.  Machine is not due for replacement. Plan-continue CPAP 12, [place] in [place].

## 2018-03-07 NOTE — Progress Notes (Signed)
Pharmacy Antibiotic Note  Grant [name] is a 67 y.o. male admitted on 03/07/2018 with sepsis.  Pharmacy has been consulted for vancomycin and zosyn dosing.Initial doses ordered in the ED  Plan: Continue vancomycin 750 mg IV q12 hours Cont zosyn EI F/u renal function, cultures and clinical course  Height: 5\' 11"  (180.3 cm) Weight: 265 lb (120.2 kg) IBW/kg (Calculated) : 75.3  Temp (24hrs), Avg:102.3 F (39.1 C), Min:101.8 F (38.8 C), Max:102.5 F (39.2 C)  Recent Labs  Lab 03/07/18 1945 03/07/18 2002  WBC  --  12.8*  CREATININE  --  1.10  LATICACIDVEN 1.74  --     Estimated Creatinine Clearance: 89.5 mL/min (by C-G formula based on SCr of 1.1 mg/dL).    Allergies  Allergen Reactions  . Amiodarone Swelling  . Codeine Itching  . Tramadol     dellusion      Thank you for allowing pharmacy to be a part of this patient's care.  Excell Seltzer Poteet 03/07/2018 11:20 PM

## 2018-03-07 NOTE — ED Provider Notes (Signed)
[facility] EMERGENCY DEPARTMENT Provider Note   CSN: [id number] Arrival date & time: 03/07/18  1907     History   Chief Complaint Chief Complaint  Patient presents with  . Chest Pain  . Generalized Body Aches    HPI Grant [name] is a 67 y.o. male past medical history of aortic graft with mechanical valve, chronic fatigue, fibromyalgia, GERD, toxic encephalopathy, hypothyroidism who presents for evaluation of rigors, fever, chest pain that began at approximately 6:30 PM this evening.  Wife reports that patient had been sitting on the porch and states that he came in complaining of diffuse Reiger's.  She states there was no tonic-clonic seizure activity.  She states that patient appeared flushed and she took his blood pressure noted it was low.  Patient then started complaining of some central chest pain.  She noted a fever at home and gave him aspirin.  Patient reports that since this was so sudden, she called EMS for further evaluation.  On ED arrival, patient states that he is having some mid sternal chest pain.  He states it is an ache.  He also reports diffuse myalgias.  He states that his chest pain is not worse with deep inspiration and has not noticed that it was worse with exertion.  He does not have any associated nausea, vomiting, diaphoresis.  Patient states that he only sat outside in the porch for approximately 30 minutes before coming in.  He had not had any other acute exposures today.  He states he was maintaining adequate hydration during this time.  Wife and patient reports that he was in his normal state of health prior to today's onset of symptoms.  Patient denies any cough, difficulty breathing, abdominal pain, vomiting, numbness/weakness of his extremities.  The history is provided by the patient.    Past Medical History:  Diagnosis Date  . Allergic rhinitis   . Anxiety   . Arthritis   . Asthma   . Bladder tumor   . Chronic fatigue   .  Chronic fatigue   . Depression    06/30/17 Pt denies being depressed, reports Effexor is taken for Chronic Fatigue   . Dyspnea   . Enlarged prostate   . Fibromyalgia   . GERD (gastroesophageal reflux disease)   . Headache   . History of chronic bronchitis   . History of migraine   . History of toxic encephalopathy   . Hypothyroidism   . OSA on CPAP    CPAP 14  . PAF (paroxysmal atrial fibrillation) ([place]) CARDIOLOGIST -- DR [name]   DX OCT 2013  . 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 Unspecified essential hypertension   . Urethral tumor    PROSTATIC    Patient Active Problem List   Diagnosis Date Noted  . Angioedema 03/07/2018  . Sepsis ([place]) 03/07/2018  . Asthma 03/07/2018  . Hypothyroidism 03/07/2018  . OSA on CPAP 03/07/2018  . BPH (benign prostatic hyperplasia) 03/07/2018  . Allergic reaction 01/21/2018  . Atrial fibrillation with RVR ([place]) 07/28/2017  . S/P AVR 07/02/2017  . Eczema 01/01/2014  . Hoarseness or changing voice 07/24/2013  . PAF (paroxysmal atrial fibrillation) ([place]) 05/18/2013  . Chronic anticoagulation 05/18/2013  . Preop cardiovascular exam 05/18/2013  . Macular rash 08/10/2012  . Acute asthmatic bronchitis 05/05/2012  . Essential hypertension 06/17/2010  . SYNCOPE 06/14/2010  . DEPRESSION 02/11/2008  . Obstructive sleep apnea 02/11/2008  . Seasonal and perennial allergic rhinitis 02/11/2008  . Chronic bronchitis (  [place]) 02/11/2008  . GERD (gastroesophageal reflux disease) 02/11/2008    Past Surgical History:  Procedure Laterality Date  . BENTALL PROCEDURE N/A 07/02/2017   Procedure: BENTALL PROCEDURE;  Surgeon: Ivin Poot, MD;  Location: [facility];  Service: Open Heart Surgery;  Laterality: N/A;  WITH CIRC ARREST  . COLONOSCOPY    . CYSTOSCOPY W/ RETROGRADES Bilateral 06/15/2013   Procedure: CYSTOSCOPY WITH BILATERAL RETROGRADE PYELOGRAM  BLADDER BIOPSY, PROSTATIC URETHRAL BIOPSY, ;  Surgeon: Molli Hazard, MD;  Location: [facility];  Service: Urology;  Laterality: Bilateral;  . LAPAROSCOPIC CHOLECYSTECTOMY  01-14-2001  . RIGHT/LEFT HEART CATH AND CORONARY ANGIOGRAPHY N/A 06/12/2017   Procedure: RIGHT/LEFT HEART CATH AND CORONARY ANGIOGRAPHY;  Surgeon: Larey Dresser, MD;  Location: [facility] CV LAB;  Service: Cardiovascular;  Laterality: N/A;  . TEE WITHOUT CARDIOVERSION N/A 07/02/2017   Procedure: TRANSESOPHAGEAL ECHOCARDIOGRAM (TEE);  Surgeon: Prescott Gum, Collier Salina, MD;  Location: [facility];  Service: Open Heart Surgery;  Laterality: N/A;  . UMBILICAL HERNIA REPAIR  01-14-2008        Home Medications    Prior to Admission medications   Medication Sig Start Date End Date Taking? Authorizing Provider  acetaminophen (TYLENOL) 500 MG tablet Take 500 mg by mouth every 6 (six) hours as needed for moderate pain.   Yes [provider]  albuterol (VENTOLIN HFA) 108 (90 Base) MCG/ACT inhaler INHALE 2 PUFFS INTO LUNGS EVERY 6 HOURS AS NEEDED FOR WHEEZING OR SHORTNESS OF BREATH 01/19/17  Yes Young, Clinton D, MD  amoxicillin (AMOXIL) 500 MG tablet Take 4 tablets prior to any dental procedures 02/26/18  Yes Josue Hector, MD  aspirin 81 MG EC tablet Take 1 tablet (81 mg total) by mouth daily. 07/13/17  Yes Gold, Wayne E, PA-C  azelastine (ASTELIN) 137 MCG/SPRAY nasal spray Place 1-2 sprays into the nose 2 (two) times daily as needed for rhinitis or allergies.    Yes [provider]  budesonide-formoterol (SYMBICORT) 160-4.5 MCG/ACT inhaler Inhale 2 puffs into the lungs 2 (two) times daily.   Yes [provider]  Cholecalciferol (VITAMIN D-3) 5000 UNITS TABS Take 5,000 Units by mouth daily with breakfast.    Yes [provider]  COLCRYS 0.6 MG tablet Take 0.6 mg by mouth daily. With breakfast 01/29/12  Yes [provider]  Cyanocobalamin (VITAMIN B12 SL) Place 1,200 mcg under the tongue daily. 1 dropper   Yes [provider]  dextromethorphan (DELSYM) 30 MG/5ML liquid Take 30 mg  by mouth at bedtime as needed for cough.    Yes [provider]  DHEA 50 MG TABS Take 50 mg by mouth daily with breakfast.    Yes [provider]  EPINEPHrine 0.3 mg/0.3 mL IJ SOAJ injection Inject 0.3 mLs (0.3 mg total) into the muscle as needed (for angioedema). 02/26/18  Yes Josue Hector, MD  finasteride (PROSCAR) 5 MG tablet Take 5 mg by mouth at bedtime.  01/14/14  Yes [provider]  Hypromellose Vertell Limber ALLERGY NASAL SPRAY NA) Place 1 spray into both nostrils daily as needed (for allergies).    Yes [provider]  levothyroxine (SYNTHROID, LEVOTHROID) 112 MCG tablet Take 112 mcg by mouth daily before breakfast.    Yes [provider]  liothyronine (CYTOMEL) 25 MCG tablet Take 25 mcg by mouth daily.    Yes [provider]  loratadine (CLARITIN) 10 MG tablet Take 10 mg by mouth daily.   Yes [provider]  Menthol, Topical  Analgesic, (BLUE-EMU MAXIMUM STRENGTH EX) Apply 1 application topically 4 (four) times daily as needed (for arthritis pain.).   Yes [provider]  metoprolol tartrate (LOPRESSOR) 50 MG tablet TAKE 1 TABLET(50 MG) BY MOUTH TWICE DAILY 12/02/17  Yes Bhagat, Bhavinkumar, PA  Multiple Vitamins-Minerals (ONE-A-DAY MENS HEALTH FORMULA PO) Take 1 tablet by mouth daily with breakfast.    Yes [provider]  omeprazole (PRILOSEC) 40 MG capsule Take 1 capsule (40 mg total) by mouth 2 (two) times daily. 10/31/16  Yes Josue Hector, MD  Probiotic Product (ALIGN) 4 MG CAPS Take 4 mg by mouth daily with breakfast.    Yes [provider]  rivaroxaban (XARELTO) 20 MG TABS tablet Take 1 tablet (20 mg total) daily with supper by mouth. 08/28/17  Yes Josue Hector, MD  rizatriptan (MAXALT) 10 MG tablet Take 10 mg by mouth as needed for migraine. May repeat in 2 hours if needed   Yes [provider]  tamsulosin (FLOMAX) 0.4 MG CAPS capsule Take 0.4 mg by mouth daily after supper.   Yes  [provider]  testosterone cypionate (DEPOTESTOTERONE CYPIONATE) 100 MG/ML injection Inject 200 mg into the muscle every Thursday. For IM use only    Yes [provider]  tetrahydrozoline-zinc (VISINE-AC) 0.05-0.25 % ophthalmic solution Place 1 drop into both eyes as needed (dry eyes).   Yes [provider]  venlafaxine (EFFEXOR-XR) 150 MG 24 hr capsule Take 150 mg by mouth daily with breakfast.    Yes [provider]  benzonatate (TESSALON) 200 MG capsule Take 1 capsule (200 mg total) by mouth every 6 (six) hours as needed for cough. Patient not taking: Reported on 03/07/2018 11/19/16   Deneise Lever, MD    Family History Family History  Problem Relation Age of Onset  . Aortic aneurysm Mother 81       cause of death  . Other Father        motor vehicle accident  . Heart disease Unknown        family history    Social History Social History   Tobacco Use  . Smoking status: Former Smoker    Packs/day: 0.50    Years: 27.00    Pack years: 13.50    Types: Cigarettes    Last attempt to quit: 10/21/1979    Years since quitting: 38.4  . Smokeless tobacco: Never Used  Substance Use Topics  . Alcohol use: Yes    Alcohol/week: 12.6 oz    Types: 21 Cans of beer per week    Comment: 4-5 beers a day  . Drug use: No     Allergies   Amiodarone; Codeine; and Tramadol   Review of Systems Review of Systems  Constitutional: Positive for diaphoresis, fatigue and fever. Negative for chills.  HENT: Negative for congestion.   Eyes: Negative for visual disturbance.  Respiratory: Negative for cough and shortness of breath.   Cardiovascular: Positive for chest pain.  Gastrointestinal: Negative for abdominal pain, diarrhea, nausea and vomiting.  Genitourinary: Negative for dysuria and hematuria.  Musculoskeletal: Positive for myalgias. Negative for back pain and neck pain.  Skin: Negative for rash.  Neurological: Negative for dizziness, weakness,  numbness and headaches.  All other systems reviewed and are negative.    Physical Exam Updated Vital Signs BP 140/87   Pulse (!) 104   Temp (!) 101.8 F (38.8 C) (Oral)   Resp (!) 27   Ht 5\' 11"  (1.803 m)   Wt  120.2 kg (265 lb)   SpO2 92%   BMI 36.96 kg/m   Physical Exam  Constitutional: He is oriented to person, place, and time. He appears well-developed and well-nourished.  HENT:  Head: Normocephalic and atraumatic.  Mouth/Throat: Oropharynx is clear and moist and mucous membranes are normal.  Eyes: Pupils are equal, round, and reactive to light. Conjunctivae, EOM and lids are normal.  Neck: Full passive range of motion without pain.  Cardiovascular: Normal rate, regular rhythm, normal heart sounds and normal pulses. Exam reveals no gallop and no friction rub.  No murmur heard. Pulmonary/Chest: Effort normal and breath sounds normal.  Lungs clear to auscultation bilaterally.  Symmetric chest rise.  No wheezing, rales, rhonchi.  Abdominal: Soft. Normal appearance. There is no tenderness. There is no rigidity and no guarding.  Abdomen is soft, non-distended, non-tender. No rigidity, No guarding. No peritoneal signs.  Musculoskeletal: Normal range of motion.  Neurological: [name], place, and time.  Follows commands, Moves all extremities  5/5 strength to BUE and BLE  Sensation intact throughout all major nerve distributions  Skin: Skin is warm and dry. Capillary refill takes less than 2 seconds.  Blanching erythema noted to face, anterior chest, posterior back. No splinter hemorrhages. No nodules noted.   Psychiatric: He has a normal mood and affect. His speech is normal.  Nursing note and vitals reviewed.    ED Treatments / Results  Labs (all labs ordered are listed, but only abnormal results are displayed) Labs Reviewed  COMPREHENSIVE METABOLIC PANEL - Abnormal; Notable for the following components:      Result Value   Glucose, Bld 102 (*)     Total Protein 6.1 (*)    All other components within normal limits  CBC WITH DIFFERENTIAL/PLATELET - Abnormal; Notable for the following components:   WBC 12.8 (*)    Hemoglobin 9.3 (*)    HCT 33.0 (*)    MCH 22.0 (*)    MCHC 28.2 (*)    RDW 17.6 (*)    Neutro Abs 10.5 (*)    Monocytes Absolute 1.4 (*)    All other components within normal limits  URINALYSIS, ROUTINE W REFLEX MICROSCOPIC - Abnormal; Notable for the following components:   Hgb urine dipstick MODERATE (*)    RBC / HPF >50 (*)    All other components within normal limits  CULTURE, BLOOD (ROUTINE X 2)  CULTURE, BLOOD (ROUTINE X 2)  INFLUENZA PANEL BY PCR (TYPE A & B)  I-STAT CG4 LACTIC ACID, ED  I-STAT TROPONIN, ED    EKG EKG Interpretation  Date/Time:  Sunday Mar 07 2018 19:14:24 EDT Ventricular Rate:  84 PR Interval:    QRS Duration: 116 QT Interval:  372 QTC Calculation: 440 R Axis:   -27 Text Interpretation:  Sinus rhythm Nonspecific intraventricular conduction delay Borderline repolarization abnormality similar to prior 10/18 Confirmed by Aletta Edouard [phone]) on 03/07/2018 7:20:30 PM   Radiology Dg Chest 2 View  Result Date: 03/07/2018 CLINICAL DATA:  Chest pain EXAM: CHEST - 2 VIEW COMPARISON:  08/05/2017 FINDINGS: Prior median sternotomy and valve replacement. Mild cardiomegaly. No confluent airspace opacities or effusions. No acute bony abnormality. IMPRESSION: Mild cardiomegaly.  No active disease. Electronically Signed   By: Rolm Baptise M.D.   On: 03/07/2018 20:15    Procedures .Critical Care Performed by: Volanda Napoleon, PA-C Authorized by: Volanda Napoleon, PA-C   Critical care provider statement:    Critical care time (minutes):  35   Critical care time was exclusive of:  Separately billable procedures and treating other patients   Critical care was necessary to treat or prevent imminent or life-threatening deterioration of the following conditions:  Cardiac failure, renal  failure, respiratory failure, circulatory failure, sepsis and shock   Critical care was time spent personally by me on the following activities:  Blood draw for specimens, ordering and performing treatments and interventions, ordering and review of laboratory studies, development of treatment plan with patient or surrogate, discussions with consultants, pulse oximetry, re-evaluation of patient's condition and evaluation of patient's response to treatment   (including critical care time)  Medications Ordered in ED Medications  piperacillin-tazobactam (ZOSYN) IVPB 3.375 g (has no administration in time range)  vancomycin (VANCOCIN) 1,500 mg in sodium chloride 0.9 % 500 mL IVPB (has no administration in time range)  acetaminophen (TYLENOL) tablet 1,000 mg (1,000 mg Oral Given 03/07/18 1950)  sodium chloride 0.9 % bolus 1,000 mL (0 mLs Intravenous Stopped 03/07/18 2126)  fentaNYL (SUBLIMAZE) injection 50 mcg (50 mcg Intravenous Given 03/07/18 2128)  ibuprofen (ADVIL,MOTRIN) tablet 400 mg (400 mg Oral Given 03/07/18 2128)     Initial Impression / Assessment and Plan / ED Course  I have reviewed the triage vital signs and the nursing notes.  Pertinent labs & imaging results that were available during my care of the patient were reviewed by me and considered in my medical decision making (see chart for details).  Clinical Course as of Mar 07 2309  Sun Mar 07, 6752  2035 67 year old male with multiple medical problems here with acute onset of fever chills Reiger's malaise is some associated chest pain.  He does not have an obvious source for the fever as far as any cough or sore throat or diarrhea or urinary symptoms.  He is getting some lab work chest x-ray EKG getting some IV fluids.  He possibly would need to be admitted just due to his comorbid medical issues but does not look particularly toxic.   [MB]    Clinical Course User Index [MB] Hayden Rasmussen, MD    67 year old male with past  medical history of aortic graft with mechanical valve, fibromyalgia, GERD, hypothyroidism who presents for evaluation of fevers, chills, rigors, generalized myalgias that began this afternoon.  Reports he had been sitting outside for approximately 30 minutes prior to onset of symptoms.  Also reports some central chest pain.  Not worse with exertion, deep inspiration.  On initial ED arrival, patient was febrile.  Vital signs otherwise stable.  Antibiotics given on ED arrival.  Fluid started.  Consider infectious etiology versus ACS etiology, though does not sound typical of ACS etiology. Do not suspect endocarditis.  Plan to check basic labs, flu, chest x-ray, EKG.  Of note, wife reports that patient has been seen multiple times over the last few months for evaluation of intermittent allergic reactions.  She states that he has been having some episodes of angioedema and states that he is followed by his primary care for these.  They have not determine what the triggering factor is.   Lactic acid unremarkable.  CMP shows hyperglycemia.  Otherwise unremarkable.  CBC shows slight leukocytosis of 12. Hemoglobin is 9.3.  Flu negative.  Troponin negative.  Chest x-ray negative for any acute infectious etiology.  Given history/risk factors, patient has a heart score of 4.   Repeat vitals show patient is still febrile after fluids and initial antibiotics.  Will give additional ibuprofen here in  the ED.  Repeat vitals show patient is still febrile.  He is slightly tachycardic now.  He meets SIRS criteria.  Given patient's significant comorbidities and unclear source of infection, feel that admission is warranted in this case.  Discussed with hospitalist.  Will plan to admit.  Final Clinical Impressions(s) / ED Diagnoses   Final diagnoses:  Sepsis, due to unspecified organism [facility])    ED Discharge Orders    None       Desma Mcgregor 03/08/18 2311    Hayden Rasmussen, MD 03/09/18 [phone]

## 2018-03-07 NOTE — Assessment & Plan Note (Signed)
He has had several episodes over the last year described as facial swelling without respiratory compromise.  He has stopped or changed several medicines, seeking a trigger and currently suspects a migraine medicine which he no longer takes.  I suggested this would be a good opportunity to establish with an allergy practice.  Plan-referral to Allergy.

## 2018-03-07 NOTE — H&P (Signed)
History and Physical    Grant [name] WLS:[id number] DOB: [birth year] DOA: 03/07/2018  Referring MD/NP/PA:   PCP: Shirline Frees, MD   Patient coming from:  The patient is coming from home.  At baseline, pt is independent for most of ADL.   Chief Complaint: Fever, chills and chest pain.  HPI: Grant [name] is a 66 y.o. male with medical history significant of hypertension, asthma, GERD, hypothyroidism, gout, depression with anxiety, BPH, obesity, OSA on CPAP, PAF on Xarelto, chronic of bronchitis, S/P AVR, who presents with fever and chills.  His wife states that pt suddenly started having fever no chills when they were sitting on the porch. Pt's face and upper chest appeared flushed. No no rashes. Pt had episode of central chest pain, which has completely resolved now.  No cough or shortness breath.  Pt reports diffuse myalgias.  Patient states that he did not take any new medications.  No insect bite.  Denies not nausea, vomiting, diarrhea, abdominal pain.  No symptoms of UTI.  He states that he has a chronic mild neck pain, which has not changed.  No neck rigidity.  He has mild headache. Patient states that he only sat outside in the porch for approximately 30 minutes. He states he was maintaining adequate hydration during this time. Pt had small cut in right lower leg, which dose not have any erythema, tenderness or draining.  ED Course: pt was found to have WBC 12.8, negative troponin, lactic acid of 1.74, negative urinalysis, electrolytes renal function okay, temperature 102.5, tachycardia, tachypnea, oxygen saturation 93% on room air, negative chest x-ray.  Patient is admitted to telemetry bed as inpatient.  Review of Systems:   General: has fevers, chills, no body weight gain, has poor appetite, has fatigue HEENT: no blurry vision, hearing changes or sore throat Respiratory: no dyspnea, coughing, wheezing CV: had chest pain, no palpitations GI: no nausea, vomiting, abdominal  pain, diarrhea, constipation GU: no dysuria, burning on urination, increased urinary frequency, hematuria  Ext: no leg edema Neuro: no unilateral weakness, numbness, or tingling, no vision change or hearing loss Skin: has small skin cut on right leg. Has flush in face and upper chest. MSK: No muscle spasm, no deformity, no limitation of range of movement in spin Heme: No easy bruising.  Travel history: No recent long distant travel.  Allergy:  Allergies  Allergen Reactions  . Amiodarone Swelling  . Codeine Itching  . Tramadol     dellusion     Past Medical History:  Diagnosis Date  . Allergic rhinitis   . Anxiety   . Arthritis   . Asthma   . Bladder tumor   . Chronic fatigue   . Chronic fatigue   . Depression    06/30/17 Pt denies being depressed, reports Effexor is taken for Chronic Fatigue   . Dyspnea   . Enlarged prostate   . Fibromyalgia   . GERD (gastroesophageal reflux disease)   . Headache   . History of chronic bronchitis   . History of migraine   . History of toxic encephalopathy   . Hypothyroidism   . OSA on CPAP    CPAP 14  . PAF (paroxysmal atrial fibrillation) ([place]) CARDIOLOGIST -- DR [name]   DX OCT 2013  . 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 Unspecified essential hypertension   . Urethral tumor    PROSTATIC    Past Surgical History:  Procedure Laterality Date  . BENTALL PROCEDURE N/A 07/02/2017   Procedure: BENTALL  PROCEDURE;  Surgeon: Ivin Poot, MD;  Location: [facility];  Service: Open Heart Surgery;  Laterality: N/A;  WITH CIRC ARREST  . COLONOSCOPY    . CYSTOSCOPY W/ RETROGRADES Bilateral 06/15/2013   Procedure: CYSTOSCOPY WITH BILATERAL RETROGRADE PYELOGRAM  BLADDER BIOPSY, PROSTATIC URETHRAL BIOPSY, ;  Surgeon: Molli Hazard, MD;  Location: [facility];  Service: Urology;  Laterality: Bilateral;  . LAPAROSCOPIC CHOLECYSTECTOMY  01-14-2001  . RIGHT/LEFT HEART CATH AND CORONARY ANGIOGRAPHY N/A 06/12/2017   Procedure: RIGHT/LEFT  HEART CATH AND CORONARY ANGIOGRAPHY;  Surgeon: Larey Dresser, MD;  Location: [place] [facility];  Service: Cardiovascular;  Laterality: N/A;  . TEE WITHOUT CARDIOVERSION N/A 07/02/2017   Procedure: TRANSESOPHAGEAL ECHOCARDIOGRAM (TEE);  Surgeon: Prescott Gum, Collier Salina, MD;  Location: [place];  Service: Open Heart Surgery;  Laterality: N/A;  . UMBILICAL HERNIA REPAIR  01-14-2008    Social History:  reports that he quit smoking about 38 years ago. His smoking use included cigarettes. He has a 13.50 pack-year smoking history. He has never used smokeless tobacco. He reports that he drinks about 12.6 oz of alcohol per week. He reports that he does not use drugs.  Family History:  Family History  Problem Relation Age of Onset  . Aortic aneurysm Mother 5       cause of death  . Other Father        motor vehicle accident  . Heart disease Unknown        family history     Prior to Admission medications   Medication Sig Start Date End Date Taking? Authorizing Provider  acetaminophen (TYLENOL) 500 MG tablet Take 500 mg by mouth every 6 (six) hours as needed for moderate pain.   Yes [provider]  albuterol (VENTOLIN HFA) 108 (90 Base) MCG/ACT inhaler INHALE 2 PUFFS INTO LUNGS EVERY 6 HOURS AS NEEDED FOR WHEEZING OR SHORTNESS OF BREATH 01/19/17  Yes Young, Clinton D, MD  amoxicillin (AMOXIL) 500 MG tablet Take 4 tablets prior to any dental procedures 02/26/18  Yes Josue Hector, MD  aspirin 81 MG EC tablet Take 1 tablet (81 mg total) by mouth daily. 07/13/17  Yes Gold, Wayne E, PA-C  azelastine (ASTELIN) 137 MCG/SPRAY nasal spray Place 1-2 sprays into the nose 2 (two) times daily as needed for rhinitis or allergies.    Yes [provider]  budesonide-formoterol (SYMBICORT) 160-4.5 MCG/ACT inhaler Inhale 2 puffs into the lungs 2 (two) times daily.   Yes [provider]  Cholecalciferol (VITAMIN D-3) 5000 UNITS TABS Take 5,000 Units by mouth daily with breakfast.    Yes [provider]  COLCRYS 0.6 MG tablet Take 0.6 mg by mouth daily. With breakfast 01/29/12  Yes [provider]  Cyanocobalamin (VITAMIN B12 SL) Place 1,200 mcg under the tongue daily. 1 dropper   Yes [provider]  dextromethorphan (DELSYM) 30 MG/5ML liquid Take 30 mg by mouth at bedtime as needed for cough.    Yes [provider]  DHEA 50 MG TABS Take 50 mg by mouth daily with breakfast.    Yes [provider]  EPINEPHrine 0.3 mg/0.3 mL IJ SOAJ injection Inject 0.3 mLs (0.3 mg total) into the muscle as needed (for angioedema). 02/26/18  Yes Josue Hector, MD  finasteride (PROSCAR) 5 MG tablet Take 5 mg by mouth at bedtime.  01/14/14  Yes [provider]  Hypromellose Vertell Limber ALLERGY NASAL SPRAY NA) Place 1 spray into both nostrils daily as needed (  for allergies).    Yes [provider]  levothyroxine (SYNTHROID, LEVOTHROID) 112 MCG tablet Take 112 mcg by mouth daily before breakfast.    Yes [provider]  liothyronine (CYTOMEL) 25 MCG tablet Take 25 mcg by mouth daily.    Yes [provider]  loratadine (CLARITIN) 10 MG tablet Take 10 mg by mouth daily.   Yes [provider]  Menthol, Topical Analgesic, (BLUE-EMU MAXIMUM STRENGTH EX) Apply 1 application topically 4 (four) times daily as needed (for arthritis pain.).   Yes [provider]  metoprolol tartrate (LOPRESSOR) 50 MG tablet TAKE 1 TABLET(50 MG) BY MOUTH TWICE DAILY 12/02/17  Yes Bhagat, Bhavinkumar, PA  Multiple Vitamins-Minerals (ONE-A-DAY MENS HEALTH FORMULA PO) Take 1 tablet by mouth daily with breakfast.    Yes [provider]  omeprazole (PRILOSEC) 40 MG capsule Take 1 capsule (40 mg total) by mouth 2 (two) times daily. 10/31/16  Yes Josue Hector, MD  Probiotic Product (ALIGN) 4 MG CAPS Take 4 mg by mouth daily with breakfast.    Yes [provider]  rivaroxaban (XARELTO) 20 MG TABS tablet Take 1 tablet (20 mg total) daily  with supper by mouth. 08/28/17  Yes Josue Hector, MD  rizatriptan (MAXALT) 10 MG tablet Take 10 mg by mouth as needed for migraine. May repeat in 2 hours if needed   Yes [provider]  tamsulosin (FLOMAX) 0.4 MG CAPS capsule Take 0.4 mg by mouth daily after supper.   Yes [provider]  testosterone cypionate (DEPOTESTOTERONE CYPIONATE) 100 MG/ML injection Inject 200 mg into the muscle every Thursday. For IM use only    Yes [provider]  tetrahydrozoline-zinc (VISINE-AC) 0.05-0.25 % ophthalmic solution Place 1 drop into both eyes as needed (dry eyes).   Yes [provider]  venlafaxine (EFFEXOR-XR) 150 MG 24 hr capsule Take 150 mg by mouth daily with breakfast.    Yes [provider]  benzonatate (TESSALON) 200 MG capsule Take 1 capsule (200 mg total) by mouth every 6 (six) hours as needed for cough. Patient not taking: Reported on 03/07/2018 11/19/16   Baird Lyons D, MD    Physical Exam: Vitals:   03/08/18 0330 03/08/18 0400 03/08/18 0430 03/08/18 0500  BP: 120/73 117/77 118/75 112/72  Pulse: 84 85 89 85  Resp: (!) 24 (!) 24 (!) 21 (!) 25  Temp:      TempSrc:      SpO2: 95% 95% 94% 93%  Weight:      Height:       General: Not in acute distress HEENT:       Eyes: PERRL, EOMI, no scleral icterus.       ENT: No discharge from the ears and nose, no pharynx injection, no tonsillar enlargement.        Neck: No JVD, no bruit, no mass felt. Heme: No neck lymph node enlargement. Cardiac: S1/S2, RRR, No murmurs, No gallops or rubs. Respiratory: No rales, wheezing, rhonchi or rubs. GI: Soft, nondistended, nontender, no rebound pain, no organomegaly, BS present. GU: No hematuria Ext: No pitting leg edema bilaterally. 2+DP/PT pulse bilaterally. Musculoskeletal: No joint deformities, No joint redness or warmth, no limitation of ROM in spin. Skin: Pt had small cut in right lower leg, which dose not have any erythema, tenderness or draining.  Face and upper chest flushed. Neuro: Alert, oriented X3, cranial nerves II-XII grossly intact, moves all extremities normally. Psych: Patient is not psychotic, no suicidal or hemocidal ideation.  Labs on Admission: I have personally reviewed following labs and imaging studies  CBC: Recent Labs  Lab 03/07/18 2002  WBC 12.8*  NEUTROABS 10.5*  HGB 9.3*  HCT 33.0*  MCV 78.2  PLT 211   Basic Metabolic Panel: Recent Labs  Lab 03/07/18 2002  NA 141  K 4.0  CL 107  CO2 26  GLUCOSE 102*  BUN 16  CREATININE 1.10  CALCIUM 8.9   GFR: Estimated Creatinine Clearance: 89.5 mL/min (by C-G formula based on SCr of 1.1 mg/dL). Liver Function Tests: Recent Labs  Lab 03/07/18 2002  AST 24  ALT 22  ALKPHOS 60  BILITOT 0.8  PROT 6.1*  ALBUMIN 3.7   No results for input(s): LIPASE, AMYLASE in the last 168 hours. No results for input(s): AMMONIA in the last 168 hours. Coagulation Profile: Recent Labs  Lab 03/08/18 0020  INR 1.31   Cardiac Enzymes: Recent Labs  Lab 03/07/18 2345 03/08/18 0020  CKTOTAL 83  --   TROPONINI  --  <0.03   BNP (last 3 results) No results for input(s): PROBNP in the last 8760 hours. HbA1C: No results for input(s): HGBA1C in the last 72 hours. CBG: No results for input(s): GLUCAP in the last 168 hours. Lipid Profile: No results for input(s): CHOL, HDL, LDLCALC, TRIG, CHOLHDL, LDLDIRECT in the last 72 hours. Thyroid Function Tests: No results for input(s): TSH, T4TOTAL, FREET4, T3FREE, THYROIDAB in the last 72 hours. Anemia Panel: No results for input(s): VITAMINB12, FOLATE, FERRITIN, TIBC, IRON, RETICCTPCT in the last 72 hours. Urine analysis:    Component Value Date/Time   COLORURINE YELLOW 03/07/2018 2036   APPEARANCEUR CLEAR 03/07/2018 2036   LABSPEC 1.018 03/07/2018 2036   PHURINE 5.0 03/07/2018 2036   GLUCOSEU NEGATIVE 03/07/2018 2036   HGBUR MODERATE (A) 03/07/2018 2036   BILIRUBINUR NEGATIVE 03/07/2018 2036   KETONESUR NEGATIVE  03/07/2018 2036   PROTEINUR NEGATIVE 03/07/2018 2036   UROBILINOGEN 0.2 01/11/2008 1105   NITRITE NEGATIVE 03/07/2018 2036   LEUKOCYTESUR NEGATIVE 03/07/2018 2036   Sepsis Labs: _0 (procalcitonin:4,lacticidven:4) )No results found for this or any previous visit (from the past 240 hour(s)).   Radiological Exams on Admission: Dg Chest 2 View  Result Date: 03/07/2018 CLINICAL DATA:  Chest pain EXAM: CHEST - 2 VIEW COMPARISON:  08/05/2017 FINDINGS: Prior median sternotomy and valve replacement. Mild cardiomegaly. No confluent airspace opacities or effusions. No acute bony abnormality. IMPRESSION: Mild cardiomegaly.  No active disease. Electronically Signed   By: Rolm Baptise M.D.   On: 03/07/2018 20:15     EKG: Independently reviewed.  Sinus rhythm, QTC 440, LAD, poor R wave progression, nonspecific T wave change.  Assessment/Plan Principal Problem:   Sepsis ([place]) Active Problems:   Essential hypertension   GERD (gastroesophageal reflux disease)   PAF (paroxysmal atrial fibrillation) ([facility])   Chronic anticoagulation   Asthma   Hypothyroidism   OSA on CPAP   BPH (benign prostatic hyperplasia)   Sepsis vs. SIRS: Patient is here for sepsis with leukocytosis, fever, tachycardia and tachypnea.  Source of infection is not clear.  Urinalysis and chest x-ray negative.  Patient had small skin cut in the right lower leg, but does not seem to be infected.  Currently hemodynamically stable.  - will admit to tele bed as inpt - Empiric antimicrobial treatment with vancomycin and Zosyn per pharmacy - Blood cultures x 2  - ESR and CRP - Respiratory virus panel - will get Procalcitonin and trend lactic acid levels per sepsis protocol. - IVF: 3.0  L of NS bolus in ED, followed by 75 cc/h  HTN:  -Continue home medications: Metoprolol -IV hydralazine prn  GERD: -Protonix  Atrial Fibrillation: CHA2DS2-VASc Score is 2, needs oral anticoagulation. Patient is on Xarelto at home. Heart rate  is 100s -Continue metoprolol and Xarelto   Asthma: stable -Dulera inhaler and Xopenex nebulizer  BPH: stable - Continue Flomax   Hypothyroidism: Last TSH was 1.910 on 07/28/2017 -Continue home Synthroid  OSA  -on CPAP   DVT ppx: on Xarelto Code Status: Full code Family Communication:   Yes, patient's wife at bed side Disposition Plan:  Anticipate discharge back to previous home environment Consults called:  none Admission status:   Inpatient/tele      Date of Service 03/08/2018    Ivor Costa [facility] Pager [phone]  If 7PM-7AM, please contact night-coverage [email or web address] Password [facility] 03/08/2018, 5:51 AM

## 2018-03-07 NOTE — Assessment & Plan Note (Signed)
Satisfactory control with Symbicort so he will continue that for now.

## 2018-03-07 NOTE — ED Notes (Signed)
Pt returned from xray

## 2018-03-08 ENCOUNTER — Inpatient Hospital Stay (HOSPITAL_COMMUNITY): Payer: Medicare Other

## 2018-03-08 ENCOUNTER — Encounter (HOSPITAL_COMMUNITY): Payer: Self-pay | Admitting: Radiology

## 2018-03-08 DIAGNOSIS — K5792 Diverticulitis of intestine, part unspecified, without perforation or abscess without bleeding: Secondary | ICD-10-CM

## 2018-03-08 LAB — RESPIRATORY PANEL BY PCR

## 2018-03-08 LAB — BASIC METABOLIC PANEL
Anion gap: 10 (ref 5–15)
BUN: 11 mg/dL (ref 6–20)
CO2: 22 mmol/L (ref 22–32)
Calcium: 8.2 mg/dL — ABNORMAL LOW (ref 8.9–10.3)
Chloride: 107 mmol/L (ref 101–111)
Creatinine, Ser: 0.95 mg/dL (ref 0.61–1.24)
GFR calc Af Amer: >60 mL/min (ref >60)
GFR calc non Af Amer: >60 mL/min (ref >60)
Glucose, Bld: 115 mg/dL — ABNORMAL HIGH (ref 65–99)
Potassium: 3.8 mmol/L (ref 3.5–5.1)
Sodium: 139 mmol/L (ref 135–145)

## 2018-03-08 LAB — CBC
HCT: 29 % — ABNORMAL LOW (ref 39.0–52.0)
Hemoglobin: 8.3 g/dL — ABNORMAL LOW (ref 13.0–17.0)
MCH: 22.4 pg — ABNORMAL LOW (ref 26.0–34.0)
MCHC: 28.6 g/dL — ABNORMAL LOW (ref 30.0–36.0)
MCV: 78.2 fL (ref 78.0–100.0)
Platelets: 163 10*3/uL (ref 150–400)
RBC: 3.71 MIL/uL — ABNORMAL LOW (ref 4.22–5.81)
RDW: 17.6 % — ABNORMAL HIGH (ref 11.5–15.5)
WBC: 9.7 10*3/uL (ref 4.0–10.5)

## 2018-03-08 LAB — TROPONIN I
Troponin I: <0.03 ng/mL (ref <0.03)
Troponin I: <0.03 ng/mL (ref <0.03)
Troponin I: <0.03 ng/mL (ref <0.03)

## 2018-03-08 LAB — SEDIMENTATION RATE: Sed Rate: 6 mm/hr (ref 0–16)

## 2018-03-08 LAB — CK: Total CK: 83 U/L (ref 49–397)

## 2018-03-08 LAB — BRAIN NATRIURETIC PEPTIDE: B Natriuretic Peptide: 466.5 pg/mL — ABNORMAL HIGH (ref 0.0–100.0)

## 2018-03-08 LAB — APTT: aPTT: 36 seconds (ref 24–36)

## 2018-03-08 LAB — PROCALCITONIN: Procalcitonin: 0.57 ng/mL

## 2018-03-08 LAB — PROTIME-INR
INR: 1.31
Prothrombin Time: 16.2 seconds — ABNORMAL HIGH (ref 11.4–15.2)

## 2018-03-08 LAB — LACTIC ACID, PLASMA: Lactic Acid, Venous: 1.9 mmol/L (ref 0.5–1.9)

## 2018-03-08 LAB — C-REACTIVE PROTEIN: CRP: 2.1 mg/dL — ABNORMAL HIGH (ref <1.0)

## 2018-03-08 MED ORDER — MUSCLE RUB 10-15 % EX CREA: TOPICAL_CREAM | Freq: Four times a day (QID) | CUTANEOUS | Status: DC | PRN

## 2018-03-08 MED ORDER — METOPROLOL TARTRATE 50 MG PO TABS
50.0000 mg | ORAL_TABLET | Freq: Two times a day (BID) | ORAL | Status: DC
  Administered 2018-03-08 – 2018-03-11 (×8): 50 mg via ORAL
  Filled 2018-03-08: qty 2
  Filled 2018-03-08 (×4): qty 1
  Filled 2018-03-08: qty 2
  Filled 2018-03-08 (×2): qty 1

## 2018-03-08 MED ORDER — ALZAIR ALLERGY NASAL SPRAY NA POWD: Freq: Every day | NASAL | Status: DC | PRN

## 2018-03-08 MED ORDER — MOMETASONE FURO-FORMOTEROL FUM 200-5 MCG/ACT IN AERO
2.0000 | INHALATION_SPRAY | Freq: Two times a day (BID) | RESPIRATORY_TRACT | Status: DC
  Filled 2018-03-08 (×2): qty 8.8

## 2018-03-08 MED ORDER — POLYETHYLENE GLYCOL 3350 17 G PO PACK: 17.0000 g | PACK | Freq: Every day | ORAL | Status: DC | PRN

## 2018-03-08 MED ORDER — NAPHAZOLINE-GLYCERIN 0.012-0.2 % OP SOLN: 1.0000 [drp] | Freq: Four times a day (QID) | OPHTHALMIC | Status: DC | PRN

## 2018-03-08 MED ORDER — DHEA 50 MG PO TABS: 50.0000 mg | ORAL_TABLET | Freq: Every day | ORAL | Status: DC

## 2018-03-08 MED ORDER — IOHEXOL 300 MG/ML  SOLN
100.0000 mL | Freq: Once | INTRAMUSCULAR | Status: AC | PRN
  Administered 2018-03-08: 100 mL via INTRAVENOUS

## 2018-03-08 MED ORDER — ONDANSETRON HCL 4 MG PO TABS: 4.0000 mg | ORAL_TABLET | Freq: Four times a day (QID) | ORAL | Status: DC | PRN

## 2018-03-08 MED ORDER — DM-GUAIFENESIN ER 30-600 MG PO TB12: 1.0000 | ORAL_TABLET | Freq: Two times a day (BID) | ORAL | Status: DC | PRN

## 2018-03-08 MED ORDER — DEXTROMETHORPHAN POLISTIREX ER 30 MG/5ML PO SUER: 30.0000 mg | Freq: Every evening | ORAL | Status: DC | PRN

## 2018-03-08 MED ORDER — AZELASTINE HCL 0.1 % NA SOLN: 1.0000 | Freq: Two times a day (BID) | NASAL | Status: DC | PRN

## 2018-03-08 MED ORDER — EPINEPHRINE 0.3 MG/0.3ML IJ SOAJ: 0.3000 mg | INTRAMUSCULAR | Status: DC | PRN

## 2018-03-08 MED ORDER — ASPIRIN EC 81 MG PO TBEC
81.0000 mg | DELAYED_RELEASE_TABLET | Freq: Every day | ORAL | Status: DC
  Administered 2018-03-09 – 2018-03-11 (×3): 81 mg via ORAL
  Filled 2018-03-08 (×4): qty 1

## 2018-03-08 MED ORDER — SODIUM CHLORIDE 0.9 % IV SOLN
Freq: Once | INTRAVENOUS | Status: AC
  Administered 2018-03-08: 18:00:00 via INTRAVENOUS

## 2018-03-08 MED ORDER — PANTOPRAZOLE SODIUM 40 MG PO TBEC
40.0000 mg | DELAYED_RELEASE_TABLET | Freq: Every day | ORAL | Status: DC
  Administered 2018-03-08 – 2018-03-11 (×4): 40 mg via ORAL
  Filled 2018-03-08 (×4): qty 1

## 2018-03-08 MED ORDER — ALIGN 4 MG PO CAPS: 4.0000 mg | ORAL_CAPSULE | Freq: Every day | ORAL | Status: DC

## 2018-03-08 MED ORDER — VENLAFAXINE HCL ER 75 MG PO CP24
150.0000 mg | ORAL_CAPSULE | Freq: Every day | ORAL | Status: DC
  Administered 2018-03-08 – 2018-03-11 (×4): 150 mg via ORAL
  Filled 2018-03-08: qty 2
  Filled 2018-03-08: qty 1
  Filled 2018-03-08 (×2): qty 2

## 2018-03-08 MED ORDER — COLCHICINE 0.6 MG PO TABS
0.6000 mg | ORAL_TABLET | Freq: Every day | ORAL | Status: DC
  Administered 2018-03-08 – 2018-03-11 (×4): 0.6 mg via ORAL
  Filled 2018-03-08 (×4): qty 1

## 2018-03-08 MED ORDER — LORATADINE 10 MG PO TABS
10.0000 mg | ORAL_TABLET | Freq: Every day | ORAL | Status: DC
  Administered 2018-03-08 – 2018-03-11 (×4): 10 mg via ORAL
  Filled 2018-03-08 (×4): qty 1

## 2018-03-08 MED ORDER — LEVOTHYROXINE SODIUM 112 MCG PO TABS
112.0000 ug | ORAL_TABLET | Freq: Every day | ORAL | Status: DC
  Administered 2018-03-08 – 2018-03-11 (×4): 112 ug via ORAL
  Filled 2018-03-08 (×5): qty 1

## 2018-03-08 MED ORDER — FINASTERIDE 5 MG PO TABS
5.0000 mg | ORAL_TABLET | Freq: Every day | ORAL | Status: DC
  Administered 2018-03-08 – 2018-03-10 (×4): 5 mg via ORAL
  Filled 2018-03-08 (×5): qty 1

## 2018-03-08 MED ORDER — ONDANSETRON HCL 4 MG/2ML IJ SOLN: 4.0000 mg | Freq: Four times a day (QID) | INTRAMUSCULAR | Status: DC | PRN

## 2018-03-08 MED ORDER — ADULT MULTIVITAMIN W/MINERALS CH
1.0000 | ORAL_TABLET | Freq: Every day | ORAL | Status: DC
  Administered 2018-03-08 – 2018-03-10 (×3): 1 via ORAL
  Filled 2018-03-08 (×4): qty 1

## 2018-03-08 MED ORDER — TAMSULOSIN HCL 0.4 MG PO CAPS
0.4000 mg | ORAL_CAPSULE | Freq: Every day | ORAL | Status: DC
  Administered 2018-03-08 – 2018-03-10 (×4): 0.4 mg via ORAL
  Filled 2018-03-08 (×4): qty 1

## 2018-03-08 MED ORDER — VITAMIN B-12 1000 MCG PO TABS
1200.0000 ug | ORAL_TABLET | Freq: Every day | ORAL | Status: DC
  Administered 2018-03-08 – 2018-03-09 (×2): 1200 ug via ORAL
  Filled 2018-03-08: qty 2
  Filled 2018-03-08: qty 1

## 2018-03-08 MED ORDER — SUMATRIPTAN SUCCINATE 50 MG PO TABS
50.0000 mg | ORAL_TABLET | Freq: Two times a day (BID) | ORAL | Status: DC | PRN
  Administered 2018-03-08: 50 mg via ORAL
  Filled 2018-03-08: qty 1

## 2018-03-08 MED ORDER — FENTANYL CITRATE (PF) 100 MCG/2ML IJ SOLN
50.0000 ug | Freq: Once | INTRAMUSCULAR | Status: AC
  Administered 2018-03-08: 50 ug via INTRAVENOUS
  Filled 2018-03-08: qty 2

## 2018-03-08 MED ORDER — LEVALBUTEROL HCL 1.25 MG/0.5ML IN NEBU
1.2500 mg | INHALATION_SOLUTION | Freq: Four times a day (QID) | RESPIRATORY_TRACT | Status: DC
  Administered 2018-03-08: 1.25 mg via RESPIRATORY_TRACT
  Filled 2018-03-08 (×3): qty 0.5

## 2018-03-08 MED ORDER — VITAMIN B12 3000 MCG/ML SL LIQD: 1200.0000 ug | Freq: Every day | SUBLINGUAL | Status: DC

## 2018-03-08 MED ORDER — RIVAROXABAN 20 MG PO TABS
20.0000 mg | ORAL_TABLET | Freq: Every day | ORAL | Status: DC
  Administered 2018-03-08 – 2018-03-10 (×4): 20 mg via ORAL
  Filled 2018-03-08 (×5): qty 1

## 2018-03-08 MED ORDER — RISAQUAD PO CAPS
1.0000 | ORAL_CAPSULE | Freq: Every day | ORAL | Status: DC
  Administered 2018-03-08 – 2018-03-11 (×4): 1 via ORAL
  Filled 2018-03-08 (×4): qty 1

## 2018-03-08 MED ORDER — VITAMIN D 1000 UNITS PO TABS
5000.0000 [IU] | ORAL_TABLET | Freq: Every day | ORAL | Status: DC
  Administered 2018-03-08: 5000 [IU] via ORAL
  Filled 2018-03-08 (×2): qty 5

## 2018-03-08 MED ORDER — LIOTHYRONINE SODIUM 25 MCG PO TABS
25.0000 ug | ORAL_TABLET | Freq: Every day | ORAL | Status: DC
  Administered 2018-03-08 – 2018-03-11 (×4): 25 ug via ORAL
  Filled 2018-03-08 (×4): qty 1

## 2018-03-08 NOTE — ED Notes (Signed)
Pt ambulated to BR without difficulty

## 2018-03-08 NOTE — ED Notes (Signed)
Pt remains NPO, PO morning meds held at this time, pt awaiting abdominal CT

## 2018-03-08 NOTE — Progress Notes (Signed)
[facility]  PROGRESS NOTE  EMMAUEL [name] LTJ:[id number] DOB: Jan 11, 1951 DOA: 03/07/2018 PCP: Grant Frees, MD   Brief HPI:   Patient says that symptoms started after he atey.o. male with medical history significant of hypertension, asthma, GERD, hypothyroidism, gout, depression with anxiety, BPH, obesity, OSA on CPAP, PAF on Xarelto, chronic of bronchitis, S/P AVR, who presents with fever and chills.  His wife states that pt suddenly started having fever no chills when they were sitting on the porch. Pt's face and upper chest appeared flushed. No no rashes. Pt had episode of central chest pain, which has completely resolved now.  No cough or shortness breath.  Pt reports diffuse myalgias.  Patient states that he did not take any new medications.  No insect bite.  Denies not nausea, vomiting, diarrhea, abdominal pain      Subjective   Patient says that the symptoms started after he ate salad for dinner.  He also complained of hard stool for past 1 week.  Today he developed mild left lower quadrant abdominal pain.  He does have history of diverticulosis.   Assessment/Plan:     1. Diverticulitis-CT of the abdomen was obtained today which showed mild diverticulitis.  Patient already has been started on vancomycin and Zosyn.  Will discontinue vancomycin at this time continue with IV Zosyn.  Start on clear liquid diet.  Continue IV normal saline at 75 mill per hour. 2. Hypertension-continue metoprolol, IV hydralazine as needed 3. GERD-continue Protonix 4. Atrial fibrillation- CHA2DS2VASc score is 2, patient is on Xarelto for anticoagulation.  Continue metoprolol for rate control. 5. Asthma-stable, continue Dulera, Xopenex nebulizer 6. BPH-continue Flomax 7. Hypothyroidism-continue Synthroid 8. Hematuria-UA shows more than 50 RBCs per high-power field, will need urology evaluation as outpatient.    DVT prophylaxis: Xarelto  Code Status: Full code  Family Communication: No  family at bedside  Disposition Plan: likely home when medically ready for discharge   Consultants:  None  Procedures:  None   Antibiotics:   Anti-infectives (From admission, onward)   Start     Dose/Rate Route Frequency Ordered Stop   03/08/18 0600  piperacillin-tazobactam (ZOSYN) IVPB 3.375 g     3.375 g 12.5 mL/hr over 240 Minutes Intravenous Every 8 hours 03/07/18 2324     03/08/18 0600  vancomycin (VANCOCIN) IVPB 750 mg/150 ml premix     750 mg 150 mL/hr over 60 Minutes Intravenous Every 12 hours 03/07/18 2324     03/07/18 2245  vancomycin (VANCOCIN) IVPB 1000 mg/200 mL premix  Status:  Discontinued     1,000 mg 200 mL/hr over 60 Minutes Intravenous  Once 03/07/18 2233 03/07/18 2239   03/07/18 2245  piperacillin-tazobactam (ZOSYN) IVPB 2.25 g  Status:  Discontinued     2.25 g 100 mL/hr over 30 Minutes Intravenous  Once 03/07/18 2233 03/07/18 2238   03/07/18 2245  piperacillin-tazobactam (ZOSYN) IVPB 3.375 g     3.375 g 12.5 mL/hr over 240 Minutes Intravenous  Once 03/07/18 2238 03/08/18 0248   03/07/18 2245  vancomycin (VANCOCIN) 1,500 mg in sodium chloride 0.9 % 500 mL IVPB     1,500 mg 250 mL/hr over 120 Minutes Intravenous  Once 03/07/18 2239 03/08/18 0117       Objective   Vitals:   03/08/18 0900 03/08/18 0904 03/08/18 1009 03/08/18 1030  BP: 116/76  134/78   Pulse: 76     Resp: 15  19   Temp:    99.9 F (37.7 C)  TempSrc:  Oral  SpO2: 95% 100%    Weight:      Height:        Intake/Output Summary (Last 24 hours) at 03/08/2018 1200 Last data filed at 03/08/2018 1101 Gross per 24 hour  Intake 1050 ml  Output -  Net 1050 ml   Filed Weights   03/07/18 1915  Weight: 120.2 kg (265 lb)     Physical Examination:    General: Appears in no acute distress  Cardiovascular: S1-S2, regular  Respiratory: Clear to auscultation bilaterally  Abdomen: Soft, mild tenderness to left lower quadrant.  No rigidity or guarding.  No  organomegaly.  Extremities: No edema noted in the lower extremities  Neurologic: Alert oriented x3, no focal deficit noted.     Data Reviewed: I have personally reviewed following labs and imaging studies  CBG: No results for input(s): GLUCAP in the last 168 hours.  CBC: Recent Labs  Lab 03/07/18 2002 03/08/18 0648  WBC 12.8* 9.7  NEUTROABS 10.5*  --   HGB 9.3* 8.3*  HCT 33.0* 29.0*  MCV 78.2 78.2  PLT 204 854    Basic Metabolic Panel: Recent Labs  Lab 03/07/18 2002 03/08/18 0648  NA 141 139  K 4.0 3.8  CL 107 107  CO2 26 22  GLUCOSE 102* 115*  BUN 16 11  CREATININE 1.10 0.95  CALCIUM 8.9 8.2*    No results found for this or any previous visit (from the past 240 hour(s)).   Liver Function Tests: Recent Labs  Lab 03/07/18 2002  AST 24  ALT 22  ALKPHOS 60  BILITOT 0.8  PROT 6.1*  ALBUMIN 3.7   No results for input(s): LIPASE, AMYLASE in the last 168 hours. No results for input(s): AMMONIA in the last 168 hours.  Cardiac Enzymes: Recent Labs  Lab 03/07/18 2345 03/08/18 0020 03/08/18 0648  CKTOTAL 83  --   --   TROPONINI  --  <0.03 <0.03   BNP (last 3 results) No results for input(s): BNP in the last 8760 hours.  ProBNP (last 3 results) No results for input(s): PROBNP in the last 8760 hours.    Studies: Dg Chest 2 View  Result Date: 03/07/2018 CLINICAL DATA:  Chest pain EXAM: CHEST - 2 VIEW COMPARISON:  08/05/2017 FINDINGS: Prior median sternotomy and valve replacement. Mild cardiomegaly. No confluent airspace opacities or effusions. No acute bony abnormality. IMPRESSION: Mild cardiomegaly.  No active disease. Electronically Signed   By: Rolm Baptise M.D.   On: 03/07/2018 20:15   Ct Abdomen Pelvis W Contrast  Result Date: 03/08/2018 CLINICAL DATA:  Sudden onset of chills.  Central chest pain. EXAM: CT ABDOMEN AND PELVIS WITH CONTRAST TECHNIQUE: Multidetector CT imaging of the abdomen and pelvis was performed using the standard protocol  following bolus administration of intravenous contrast. CONTRAST:  175mL OMNIPAQUE IOHEXOL 300 MG/ML  SOLN COMPARISON:  01/14/2017 FINDINGS: Lower chest: Trace bilateral pleural effusions. Hepatobiliary: No focal liver abnormality is seen. Status post cholecystectomy. No biliary dilatation. Pancreas: Unremarkable. No pancreatic ductal dilatation or surrounding inflammatory changes. Spleen: Normal in size without focal abnormality. Adrenals/Urinary Tract: Adrenal glands are unremarkable. Kidneys are normal, without renal calculi, focal lesion, or hydronephrosis. Bladder is unremarkable. Stomach/Bowel: Stomach is within normal limits. Appendix appears normal. No bowel dilatation to suggest obstruction. No pneumatosis, pneumoperitoneum or portal venous gas. Diverticulosis of the sigmoid colon with mild peri diverticular inflammatory changes involving the proximal sigmoid colon most concerning for acute diverticulitis. No peridiverticular fluid collection. Vascular/Lymphatic: Normal caliber abdominal aorta  with mild atherosclerosis. No lymphadenopathy. Reproductive: Prostate is unremarkable. Other: No abdominal wall hernia or abnormality. No abdominopelvic ascites. Musculoskeletal: No acute osseous abnormality. No aggressive osseous lesion. Severe bilateral facet arthropathy at L4-5 and L5-S1. IMPRESSION: 1. Diverticulosis of the sigmoid colon with mild diverticulitis. No peridiverticular fluid collection to suggest an abscess. Electronically Signed   By: Kathreen Devoid   On: 03/08/2018 10:05    Scheduled Meds: . acidophilus  1 capsule Oral Q breakfast  . [START ON 03/09/2018] aspirin EC  81 mg Oral Daily  . cholecalciferol  5,000 Units Oral Q breakfast  . colchicine  0.6 mg Oral Daily  . finasteride  5 mg Oral QHS  . levothyroxine  112 mcg Oral QAC breakfast  . liothyronine  25 mcg Oral Daily  . loratadine  10 mg Oral Daily  . metoprolol tartrate  50 mg Oral BID  . mometasone-formoterol  2 puff Inhalation BID   . multivitamin with minerals  1 tablet Oral Q breakfast  . pantoprazole  40 mg Oral Daily  . rivaroxaban  20 mg Oral Q supper  . tamsulosin  0.4 mg Oral QPC supper  . venlafaxine XR  150 mg Oral Q breakfast  . vitamin B-12  1,200 mcg Oral Daily      Time spent: 25 min  Oswald Hillock   [facility] Pager [phone]. If 7PM-7AM, please contact night-coverage at [email or web address], Office  [phone]  password [email or web address]  03/08/2018, 12:00 PM  LOS: 1 day

## 2018-03-08 NOTE — Progress Notes (Signed)
On arrival patient was already on his Home unit CPAP.

## 2018-03-08 NOTE — ED Notes (Signed)
Called pharmacy.  They are sending imitrex.

## 2018-03-08 NOTE — ED Notes (Signed)
Dr Darrick Meigs pages and responded RE pt increasing all over body pain and discomfort.  Unable to give tylenol at this time.

## 2018-03-08 NOTE — ED Notes (Signed)
Requested diet tray.

## 2018-03-08 NOTE — ED Notes (Signed)
Pt diet changed to NPO- patient and family aware

## 2018-03-08 NOTE — Progress Notes (Signed)
Grant [name] is a 67 y.o. male patient admitted from ED awake, alert - oriented  X 4 - no acute distress noted.  VSS - Blood pressure (!) 156/72, pulse 85, temperature 99.9 F (37.7 C), resp. rate (!) 24, height 5\' 11"  (1.803 m), weight 120.2 kg (265 lb), SpO2 98 %.    IV in place, occlusive dsg intact without redness.  Orientation to room, and floor completed with information packet given to patient/family.  Patient declined safety video at this time.  Admission INP armband ID verified with patient/family, and in place.   SR up x 2, fall assessment complete, with patient and family able to verbalize understanding of risk associated with falls, and verbalized understanding to call nsg before up out of bed.  Call light within reach, patient able to voice, and demonstrate understanding.  Skin, clean-dry- intact without evidence of bruising, or skin tears.   No evidence of skin break down noted on exam.     Will cont to eval and treat per MD orders.  Luci Bank, RN 03/08/2018 5:35 PM

## 2018-03-09 LAB — COMPREHENSIVE METABOLIC PANEL
ALT: 34 U/L (ref 17–63)
AST: 38 U/L (ref 15–41)
Albumin: 2.7 g/dL — ABNORMAL LOW (ref 3.5–5.0)
Alkaline Phosphatase: 43 U/L (ref 38–126)
Anion gap: 7 (ref 5–15)
BUN: 8 mg/dL (ref 6–20)
CO2: 27 mmol/L (ref 22–32)
Calcium: 8.3 mg/dL — ABNORMAL LOW (ref 8.9–10.3)
Chloride: 105 mmol/L (ref 101–111)
Creatinine, Ser: 1.11 mg/dL (ref 0.61–1.24)
GFR calc Af Amer: >60 mL/min (ref >60)
GFR calc non Af Amer: >60 mL/min (ref >60)
Glucose, Bld: 109 mg/dL — ABNORMAL HIGH (ref 65–99)
Potassium: 3.9 mmol/L (ref 3.5–5.1)
Sodium: 139 mmol/L (ref 135–145)
Total Bilirubin: 0.8 mg/dL (ref 0.3–1.2)
Total Protein: 5.1 g/dL — ABNORMAL LOW (ref 6.5–8.1)

## 2018-03-09 LAB — URINE CULTURE: Culture: NO GROWTH

## 2018-03-09 LAB — CBC
HCT: 28.5 % — ABNORMAL LOW (ref 39.0–52.0)
Hemoglobin: 8 g/dL — ABNORMAL LOW (ref 13.0–17.0)
MCH: 22.2 pg — ABNORMAL LOW (ref 26.0–34.0)
MCHC: 28.1 g/dL — ABNORMAL LOW (ref 30.0–36.0)
MCV: 78.9 fL (ref 78.0–100.0)
Platelets: 161 10*3/uL (ref 150–400)
RBC: 3.61 MIL/uL — ABNORMAL LOW (ref 4.22–5.81)
RDW: 17.7 % — ABNORMAL HIGH (ref 11.5–15.5)
WBC: 7 10*3/uL (ref 4.0–10.5)

## 2018-03-09 LAB — GLUCOSE, CAPILLARY: Glucose-Capillary: 91 mg/dL (ref 65–99)

## 2018-03-09 MED ORDER — CIPROFLOXACIN IN D5W 400 MG/200ML IV SOLN
400.0000 mg | Freq: Two times a day (BID) | INTRAVENOUS | Status: DC
  Administered 2018-03-09 – 2018-03-11 (×4): 400 mg via INTRAVENOUS
  Filled 2018-03-09 (×4): qty 200

## 2018-03-09 MED ORDER — KETOROLAC TROMETHAMINE 30 MG/ML IJ SOLN
30.0000 mg | Freq: Once | INTRAMUSCULAR | Status: AC
  Administered 2018-03-09: 30 mg via INTRAVENOUS
  Filled 2018-03-09: qty 1

## 2018-03-09 MED ORDER — METRONIDAZOLE IN NACL 5-0.79 MG/ML-% IV SOLN
500.0000 mg | Freq: Three times a day (TID) | INTRAVENOUS | Status: DC
  Administered 2018-03-09 – 2018-03-11 (×6): 500 mg via INTRAVENOUS
  Filled 2018-03-09 (×6): qty 100

## 2018-03-09 NOTE — Progress Notes (Signed)
[facility]  PROGRESS NOTE  Grant [name] XLK:[id number] DOB: 1951/03/13 DOA: 03/07/2018 PCP: Shirline Frees, MD   Brief HPI:   Patient says that symptoms started after he atey.o. male with medical history significant of hypertension, asthma, GERD, hypothyroidism, gout, depression with anxiety, BPH, obesity, OSA on CPAP, PAF on Xarelto, chronic of bronchitis, S/P AVR, who presents with fever and chills.  His wife states that pt suddenly started having fever no chills when they were sitting on the porch. Pt's face and upper chest appeared flushed. No no rashes. Pt had episode of central chest pain, which has completely resolved now.  No cough or shortness breath.  Pt reports diffuse myalgias.  Patient states that he did not take any new medications.  No insect bite.  Denies not nausea, vomiting, diarrhea, abdominal pain      Subjective   Patient seen and examined, tolerated liquid diet well.  Wants to try soft diet.   Assessment/Plan:     1. Diverticulitis-CT of the abdomen was obtained today which showed mild diverticulitis.  Patient already has been started on vancomycin and Zosyn.  Will discontinue vancomycin at this time continue with IV Zosyn.  Patient was started on clear liquid diet, will advance diet to soft diet.  Change antibiotics to Cipro and Flagyl.  If patient tolerates diet well he can be discharged in a.m.  2. Hypertension-continue metoprolol, IV hydralazine as needed if  3. GERD-continue Protonix  4. Atrial fibrillation- CHA2DS2VASc score is 2, patient is on Xarelto for anticoagulation.  Continue metoprolol for rate control.  5. Asthma-stable, continue Dulera, Xopenex nebulizer  6. BPH-continue Flomax  7. Hypothyroidism-continue Synthroid  8. Hematuria-UA shows more than 50 RBCs per high-power field.  Called and discussed with urology Dr. Jeffie Pollock, patient will need outpatient cystoscopy.  He can follow-up with urology after discharge.    DVT  prophylaxis: Xarelto  Code Status: Full code  Family Communication: No family at bedside  Disposition Plan: likely home when medically ready for discharge   Consultants:  None  Procedures:  None   Antibiotics:   Anti-infectives (From admission, onward)   Start     Dose/Rate Route Frequency Ordered Stop   03/08/18 0600  piperacillin-tazobactam (ZOSYN) IVPB 3.375 g     3.375 g 12.5 mL/hr over 240 Minutes Intravenous Every 8 hours 03/07/18 2324     03/08/18 0600  vancomycin (VANCOCIN) IVPB 750 mg/150 ml premix  Status:  Discontinued     750 mg 150 mL/hr over 60 Minutes Intravenous Every 12 hours 03/07/18 2324 03/08/18 1209   03/07/18 2245  vancomycin (VANCOCIN) IVPB 1000 mg/200 mL premix  Status:  Discontinued     1,000 mg 200 mL/hr over 60 Minutes Intravenous  Once 03/07/18 2233 03/07/18 2239   03/07/18 2245  piperacillin-tazobactam (ZOSYN) IVPB 2.25 g  Status:  Discontinued     2.25 g 100 mL/hr over 30 Minutes Intravenous  Once 03/07/18 2233 03/07/18 2238   03/07/18 2245  piperacillin-tazobactam (ZOSYN) IVPB 3.375 g     3.375 g 12.5 mL/hr over 240 Minutes Intravenous  Once 03/07/18 2238 03/08/18 0248   03/07/18 2245  vancomycin (VANCOCIN) 1,500 mg in sodium chloride 0.9 % 500 mL IVPB     1,500 mg 250 mL/hr over 120 Minutes Intravenous  Once 03/07/18 2239 03/08/18 0117       Objective   Vitals:   03/08/18 2143 03/09/18 0025 03/09/18 0556 03/09/18 1227  BP:   (!) 100/56 (!) 147/87  Pulse:   64  76  Resp:   18   Temp: (!) 100.5 F (38.1 C) 99.1 F (37.3 C) 99.2 F (37.3 C)   TempSrc: Oral Oral Oral   SpO2:   98% 99%  Weight:      Height:        Intake/Output Summary (Last 24 hours) at 03/09/2018 1551 Last data filed at 03/09/2018 1500 Gross per 24 hour  Intake 1720.33 ml  Output 900 ml  Net 820.33 ml   Filed Weights   03/07/18 1915  Weight: 120.2 kg (265 lb)     Physical Examination:    General: Appears in no acute distress  Cardiovascular:  S1S2 RRR  Respiratory- clear bilaterally  Abdomen: soft, mild tenderness in LLQ, mild guarding  Extremities: No edema in lower extremities  Neurologic:  Alert, oriented x 3     Data Reviewed: I have personally reviewed following labs and imaging studies  CBG: Recent Labs  Lab 03/09/18 0742  GLUCAP 91    CBC: Recent Labs  Lab 03/07/18 2002 03/08/18 0648 03/09/18 0338  WBC 12.8* 9.7 7.0  NEUTROABS 10.5*  --   --   HGB 9.3* 8.3* 8.0*  HCT 33.0* 29.0* 28.5*  MCV 78.2 78.2 78.9  PLT 204 163 948    Basic Metabolic Panel: Recent Labs  Lab 03/07/18 2002 03/08/18 0648 03/09/18 0338  NA 141 139 139  K 4.0 3.8 3.9  CL 107 107 105  CO2 26 22 27   GLUCOSE 102* 115* 109*  BUN 16 11 8   CREATININE 1.10 0.95 1.11  CALCIUM 8.9 8.2* 8.3*    Recent Results (from the past 240 hour(s))  Respiratory Panel by PCR     Status: None   Collection Time: 03/07/18  7:47 PM  Result Value Ref Range Status   Adenovirus NOT DETECTED NOT DETECTED Final   Coronavirus 229E NOT DETECTED NOT DETECTED Final   Coronavirus HKU1 NOT DETECTED NOT DETECTED Final   Coronavirus NL63 NOT DETECTED NOT DETECTED Final   Coronavirus OC43 NOT DETECTED NOT DETECTED Final   Metapneumovirus NOT DETECTED NOT DETECTED Final   Rhinovirus / Enterovirus NOT DETECTED NOT DETECTED Final   Influenza A NOT DETECTED NOT DETECTED Final   Influenza B NOT DETECTED NOT DETECTED Final   Parainfluenza Virus 1 NOT DETECTED NOT DETECTED Final   Parainfluenza Virus 2 NOT DETECTED NOT DETECTED Final   Parainfluenza Virus 3 NOT DETECTED NOT DETECTED Final   Parainfluenza Virus 4 NOT DETECTED NOT DETECTED Final   Respiratory Syncytial Virus NOT DETECTED NOT DETECTED Final   Bordetella pertussis NOT DETECTED NOT DETECTED Final   Chlamydophila pneumoniae NOT DETECTED NOT DETECTED Final   Mycoplasma pneumoniae NOT DETECTED NOT DETECTED Final    Comment: Performed at [facility], [address] [place], [place]  [address]  Blood culture (routine x 2)     Status: None (Preliminary result)   Collection Time: 03/07/18 10:25 PM  Result Value Ref Range Status   Specimen Description BLOOD LEFT ANTECUBITAL  Final   Special Requests   Final    BOTTLES DRAWN AEROBIC ONLY Blood Culture results may not be optimal due to an inadequate volume of blood received in culture bottles   Culture   Final    NO GROWTH 2 DAYS Performed at [facility], [place] [name] [place], [place] [address]    Report Status PENDING  Incomplete  Blood culture (routine x 2)     Status: None (Preliminary result)   Collection  Time: 03/07/18 11:22 PM  Result Value Ref Range Status   Specimen Description BLOOD RIGHT ANTECUBITAL  Final   Special Requests   Final    BOTTLES DRAWN AEROBIC AND ANAEROBIC Blood Culture adequate volume   Culture   Final    NO GROWTH 2 DAYS Performed at [facility], [address] [place], [place] [address]    Report Status PENDING  Incomplete  Urine Culture     Status: None   Collection Time: 03/08/18  1:18 AM  Result Value Ref Range Status   Specimen Description URINE, RANDOM  Final   Special Requests NONE  Final   Culture   Final    NO GROWTH Performed at [facility], [address] [place], [place] [address]    Report Status 03/09/2018 FINAL  Final     Liver Function Tests: Recent Labs  Lab 03/07/18 2002 03/09/18 0338  AST 24 38  ALT 22 34  ALKPHOS 60 43  BILITOT 0.8 0.8  PROT 6.1* 5.1*  ALBUMIN 3.7 2.7*   No results for input(s): LIPASE, AMYLASE in the last 168 hours. No results for input(s): AMMONIA in the last 168 hours.  Cardiac Enzymes: Recent Labs  Lab 03/07/18 2345 03/08/18 0020 03/08/18 0648 03/08/18 1755  CKTOTAL 83  --   --   --   TROPONINI  --  <0.03 <0.03 <0.03   BNP (last 3 results) Recent Labs    03/08/18 1755  BNP 466.5*    ProBNP (last 3 results) No results for input(s): PROBNP in the last 8760 hours.    Studies: Dg Chest 2  View  Result Date: 03/07/2018 CLINICAL DATA:  Chest pain EXAM: CHEST - 2 VIEW COMPARISON:  08/05/2017 FINDINGS: Prior median sternotomy and valve replacement. Mild cardiomegaly. No confluent airspace opacities or effusions. No acute bony abnormality. IMPRESSION: Mild cardiomegaly.  No active disease. Electronically Signed   By: Rolm Baptise M.D.   On: 03/07/2018 20:15   Ct Abdomen Pelvis W Contrast  Result Date: 03/08/2018 CLINICAL DATA:  Sudden onset of chills.  Central chest pain. EXAM: CT ABDOMEN AND PELVIS WITH CONTRAST TECHNIQUE: Multidetector CT imaging of the abdomen and pelvis was performed using the standard protocol following bolus administration of intravenous contrast. CONTRAST:  170mL OMNIPAQUE IOHEXOL 300 MG/ML  SOLN COMPARISON:  01/14/2017 FINDINGS: Lower chest: Trace bilateral pleural effusions. Hepatobiliary: No focal liver abnormality is seen. Status post cholecystectomy. No biliary dilatation. Pancreas: Unremarkable. No pancreatic ductal dilatation or surrounding inflammatory changes. Spleen: Normal in size without focal abnormality. Adrenals/Urinary Tract: Adrenal glands are unremarkable. Kidneys are normal, without renal calculi, focal lesion, or hydronephrosis. Bladder is unremarkable. Stomach/Bowel: Stomach is within normal limits. Appendix appears normal. No bowel dilatation to suggest obstruction. No pneumatosis, pneumoperitoneum or portal venous gas. Diverticulosis of the sigmoid colon with mild peri diverticular inflammatory changes involving the proximal sigmoid colon most concerning for acute diverticulitis. No peridiverticular fluid collection. Vascular/Lymphatic: Normal caliber abdominal aorta with mild atherosclerosis. No lymphadenopathy. Reproductive: Prostate is unremarkable. Other: No abdominal wall hernia or abnormality. No abdominopelvic ascites. Musculoskeletal: No acute osseous abnormality. No aggressive osseous lesion. Severe bilateral facet arthropathy at L4-5 and  L5-S1. IMPRESSION: 1. Diverticulosis of the sigmoid colon with mild diverticulitis. No peridiverticular fluid collection to suggest an abscess. Electronically Signed   By: Kathreen Devoid   On: 03/08/2018 10:05    Scheduled Meds: . acidophilus  1 capsule Oral Q breakfast  . aspirin EC  81 mg Oral Daily  .  cholecalciferol  5,000 Units Oral Q breakfast  . colchicine  0.6 mg Oral Daily  . finasteride  5 mg Oral QHS  . levothyroxine  112 mcg Oral QAC breakfast  . liothyronine  25 mcg Oral Daily  . loratadine  10 mg Oral Daily  . metoprolol tartrate  50 mg Oral BID  . mometasone-formoterol  2 puff Inhalation BID  . multivitamin with minerals  1 tablet Oral Q breakfast  . pantoprazole  40 mg Oral Daily  . rivaroxaban  20 mg Oral Q supper  . tamsulosin  0.4 mg Oral QPC supper  . venlafaxine XR  150 mg Oral Q breakfast  . vitamin B-12  1,200 mcg Oral Daily      Time spent: 25 min  Oswald Hillock   [facility] Pager [phone]. If 7PM-7AM, please contact night-coverage at [email or web address], Office  [phone]  password [email or web address]  03/09/2018, 3:51 PM  LOS: 2 days

## 2018-03-09 NOTE — Progress Notes (Signed)
Patient refused bed alarm. Fall education reinforced with patient

## 2018-03-10 ENCOUNTER — Encounter (HOSPITAL_COMMUNITY): Payer: Self-pay | Admitting: Gastroenterology

## 2018-03-10 LAB — IRON AND TIBC
Iron: 12 ug/dL — ABNORMAL LOW (ref 45–182)
Saturation Ratios: 3 % — ABNORMAL LOW (ref 17.9–39.5)
TIBC: 346 ug/dL (ref 250–450)
UIBC: 334 ug/dL

## 2018-03-10 LAB — FOLATE: Folate: 26.3 ng/mL (ref >5.9)

## 2018-03-10 LAB — OCCULT BLOOD X 1 CARD TO LAB, STOOL: Fecal Occult Bld: NEGATIVE

## 2018-03-10 LAB — URINE CULTURE: Culture: NO GROWTH

## 2018-03-10 LAB — CBC
HCT: 28 % — ABNORMAL LOW (ref 39.0–52.0)
Hemoglobin: 7.9 g/dL — ABNORMAL LOW (ref 13.0–17.0)
MCH: 21.9 pg — ABNORMAL LOW (ref 26.0–34.0)
MCHC: 28.2 g/dL — ABNORMAL LOW (ref 30.0–36.0)
MCV: 77.8 fL — ABNORMAL LOW (ref 78.0–100.0)
Platelets: 148 10*3/uL — ABNORMAL LOW (ref 150–400)
RBC: 3.6 MIL/uL — ABNORMAL LOW (ref 4.22–5.81)
RDW: 17.4 % — ABNORMAL HIGH (ref 11.5–15.5)
WBC: 8.1 10*3/uL (ref 4.0–10.5)

## 2018-03-10 LAB — BASIC METABOLIC PANEL
Anion gap: 6 (ref 5–15)
BUN: 6 mg/dL (ref 6–20)
CO2: 29 mmol/L (ref 22–32)
Calcium: 8.9 mg/dL (ref 8.9–10.3)
Chloride: 106 mmol/L (ref 101–111)
Creatinine, Ser: 1.01 mg/dL (ref 0.61–1.24)
GFR calc Af Amer: >60 mL/min (ref >60)
GFR calc non Af Amer: >60 mL/min (ref >60)
Glucose, Bld: 113 mg/dL — ABNORMAL HIGH (ref 65–99)
Potassium: 3.9 mmol/L (ref 3.5–5.1)
Sodium: 141 mmol/L (ref 135–145)

## 2018-03-10 LAB — PREPARE RBC (CROSSMATCH)

## 2018-03-10 LAB — RETICULOCYTES
RBC.: 3.55 MIL/uL — ABNORMAL LOW (ref 4.22–5.81)
Retic Count, Absolute: 49.7 10*3/uL (ref 19.0–186.0)
Retic Ct Pct: 1.4 % (ref 0.4–3.1)

## 2018-03-10 LAB — FERRITIN: Ferritin: 43 ng/mL (ref 24–336)

## 2018-03-10 LAB — MAGNESIUM: Magnesium: 1.8 mg/dL (ref 1.7–2.4)

## 2018-03-10 LAB — GLUCOSE, CAPILLARY: Glucose-Capillary: 105 mg/dL — ABNORMAL HIGH (ref 65–99)

## 2018-03-10 LAB — VITAMIN B12: Vitamin B-12: 874 pg/mL (ref 180–914)

## 2018-03-10 MED ORDER — SODIUM CHLORIDE 0.9 % IV SOLN: INTRAVENOUS | Status: DC

## 2018-03-10 MED ORDER — SODIUM CHLORIDE 0.9 % IV SOLN
Freq: Once | INTRAVENOUS | Status: AC
  Administered 2018-03-10: 14:00:00 via INTRAVENOUS

## 2018-03-10 MED ORDER — SALINE SPRAY 0.65 % NA SOLN
1.0000 | NASAL | Status: DC | PRN
  Filled 2018-03-10: qty 44

## 2018-03-10 MED ORDER — MAGNESIUM SULFATE 2 GM/50ML IV SOLN
2.0000 g | Freq: Once | INTRAVENOUS | Status: AC
  Administered 2018-03-10: 2 g via INTRAVENOUS
  Filled 2018-03-10: qty 50

## 2018-03-10 MED ORDER — FUROSEMIDE 10 MG/ML IJ SOLN
20.0000 mg | Freq: Once | INTRAMUSCULAR | Status: AC
  Administered 2018-03-10: 20 mg via INTRAVENOUS
  Filled 2018-03-10: qty 2

## 2018-03-10 NOTE — Consult Note (Signed)
Reason for Consult:symptomatic anemia Referring Physician: Hospital team  Grant [name] is an 67 y.o. male.  HPI: patient well-known to me from years of GI care and his hospital computer chart and our office computer chart was reviewed and his last colonoscopy was 3 of 16 and his recent CT did not show anything significant and he really has not had any GI symptoms are seen any blood or black stools but has been on aspirin and blood thinner but a pump inhibitor as well and has not had an upper tract workup and over 10 years and his case was discussed with his wife and is been having some fatigue and dyspnea on exertion for some time but he probably has not had a blood count since October and he has no other complaints  Past Medical History:  Diagnosis Date  . Allergic rhinitis   . Anxiety   . Arthritis   . Asthma   . Bladder tumor   . Chronic fatigue   . Chronic fatigue   . Depression    06/30/17 Pt denies being depressed, reports Effexor is taken for Chronic Fatigue   . Dyspnea   . Enlarged prostate   . Fibromyalgia   . GERD (gastroesophageal reflux disease)   . Headache   . History of chronic bronchitis   . History of migraine   . History of toxic encephalopathy   . Hypothyroidism   . OSA on CPAP    CPAP 14  . PAF (paroxysmal atrial fibrillation) ([facility]) CARDIOLOGIST -- DR [name]   DX OCT 2013  . 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 Unspecified essential hypertension   . Urethral tumor    PROSTATIC    Past Surgical History:  Procedure Laterality Date  . BENTALL PROCEDURE N/A 07/02/2017   Procedure: BENTALL PROCEDURE;  Surgeon: Ivin Poot, MD;  Location: [facility];  Service: Open Heart Surgery;  Laterality: N/A;  WITH CIRC ARREST  . CARDIAC SURGERY    . COLONOSCOPY    . CYSTOSCOPY W/ RETROGRADES Bilateral 06/15/2013   Procedure: CYSTOSCOPY WITH BILATERAL RETROGRADE PYELOGRAM  BLADDER BIOPSY, PROSTATIC URETHRAL BIOPSY, ;  Surgeon: Molli Hazard, MD;  Location: [facility];  Service: Urology;  Laterality: Bilateral;  . LAPAROSCOPIC CHOLECYSTECTOMY  01-14-2001  . RIGHT/LEFT HEART CATH AND CORONARY ANGIOGRAPHY N/A 06/12/2017   Procedure: RIGHT/LEFT HEART CATH AND CORONARY ANGIOGRAPHY;  Surgeon: Larey Dresser, MD;  Location: [facility];  Service: Cardiovascular;  Laterality: N/A;  . TEE WITHOUT CARDIOVERSION N/A 07/02/2017   Procedure: TRANSESOPHAGEAL ECHOCARDIOGRAM (TEE);  Surgeon: Prescott Gum, Collier Salina, MD;  Location: [facility];  Service: Open Heart Surgery;  Laterality: N/A;  . UMBILICAL HERNIA REPAIR  01-14-2008    Family History  Problem Relation Age of Onset  . Aortic aneurysm Mother 35       cause of death  . Other Father        motor vehicle accident  . Heart disease Unknown        family history    Social History:  reports that he quit smoking about 38 years ago. His smoking use included cigarettes. He has a 13.50 pack-year smoking history. He has never used smokeless tobacco. He reports that he drinks about 12.6 oz of alcohol per week. He reports that he does not use drugs.  Allergies:  Allergies  Allergen Reactions  . Amiodarone Swelling  . Codeine Itching  . Tramadol     dellusion     Medications:  I have reviewed the patient's current medications.  Results for orders placed or performed during the hospital encounter of 03/07/18 (from the past 48 hour(s))  Brain natriuretic peptide     Status: Abnormal   Collection Time: 03/08/18  5:55 PM  Result Value Ref Range   B Natriuretic Peptide 466.5 (H) 0.0 - 100.0 pg/mL    Comment: Performed at [facility], [address] [place], [place] [address]  Troponin I (q 6hr x 3)     Status: None   Collection Time: 03/08/18  5:55 PM  Result Value Ref Range   Troponin I <0.03 <0.03 ng/mL    Comment: Performed at [facility] [address], [place], [place] [address]  CBC     Status: Abnormal   Collection Time: 03/09/18  3:38 AM  Result Value Ref Range   WBC 7.0 4.0  - 10.5 K/uL   RBC 3.61 (L) 4.22 - 5.81 MIL/uL   Hemoglobin 8.0 (L) 13.0 - 17.0 g/dL   HCT 28.5 (L) 39.0 - 52.0 %   MCV 78.9 78.0 - 100.0 fL   MCH 22.2 (L) 26.0 - 34.0 pg   MCHC 28.1 (L) 30.0 - 36.0 g/dL   RDW 17.7 (H) 11.5 - 15.5 %   Platelets 161 150 - 400 K/uL    Comment: Performed at [facility], [place] [address], [place], [place] [address]  Comprehensive metabolic panel     Status: Abnormal   Collection Time: 03/09/18  3:38 AM  Result Value Ref Range   Sodium 139 135 - 145 mmol/L   Potassium 3.9 3.5 - 5.1 mmol/L   Chloride 105 101 - 111 mmol/L   CO2 27 22 - 32 mmol/L   Glucose, Bld 109 (H) 65 - 99 mg/dL   BUN 8 6 - 20 mg/dL   Creatinine, Ser 1.11 0.61 - 1.24 mg/dL   Calcium 8.3 (L) 8.9 - 10.3 mg/dL   Total Protein 5.1 (L) 6.5 - 8.1 g/dL   Albumin 2.7 (L) 3.5 - 5.0 g/dL   AST 38 15 - 41 U/L   ALT 34 17 - 63 U/L   Alkaline Phosphatase 43 38 - 126 U/L   Total Bilirubin 0.8 0.3 - 1.2 mg/dL   GFR calc non Af Amer >60 >60 mL/min   GFR calc Af Amer >60 >60 mL/min    Comment: (NOTE) The eGFR has been calculated using the CKD EPI equation. This calculation has not been validated in all clinical situations. eGFR's persistently <60 mL/min signify possible Chronic Kidney Disease.    Anion gap 7 5 - 15    Comment: Performed at [facility] [address], [place], [place] [address]  Glucose, capillary     Status: None   Collection Time: 03/09/18  7:42 AM  Result Value Ref Range   Glucose-Capillary 91 65 - 99 mg/dL  Culture, Urine     Status: None   Collection Time: 03/09/18  6:15 PM  Result Value Ref Range   Specimen Description URINE, CLEAN CATCH    Special Requests NONE    Culture      NO GROWTH Performed at [facility], [place] [address] [place], [place] [address]    Report Status 03/10/2018 FINAL   CBC     Status: Abnormal   Collection Time: 03/10/18  4:32 AM  Result Value Ref Range   WBC 8.1 4.0 - 10.5 K/uL   RBC 3.60 (L) 4.22 - 5.81  MIL/uL    Hemoglobin 7.9 (L) 13.0 - 17.0 g/dL   HCT 28.0 (L) 39.0 - 52.0 %   MCV 77.8 (L) 78.0 - 100.0 fL   MCH 21.9 (L) 26.0 - 34.0 pg   MCHC 28.2 (L) 30.0 - 36.0 g/dL   RDW 17.4 (H) 11.5 - 15.5 %   Platelets 148 (L) 150 - 400 K/uL    Comment: Performed at [facility] [address], [place], [place] [address]  Basic metabolic panel     Status: Abnormal   Collection Time: 03/10/18  4:32 AM  Result Value Ref Range   Sodium 141 135 - 145 mmol/L   Potassium 3.9 3.5 - 5.1 mmol/L   Chloride 106 101 - 111 mmol/L   CO2 29 22 - 32 mmol/L   Glucose, Bld 113 (H) 65 - 99 mg/dL   BUN 6 6 - 20 mg/dL   Creatinine, Ser 1.01 0.61 - 1.24 mg/dL   Calcium 8.9 8.9 - 10.3 mg/dL   GFR calc non Af Amer >60 >60 mL/min   GFR calc Af Amer >60 >60 mL/min    Comment: (NOTE) The eGFR has been calculated using the CKD EPI equation. This calculation has not been validated in all clinical situations. eGFR's persistently <60 mL/min signify possible Chronic Kidney Disease.    Anion gap 6 5 - 15    Comment: Performed at [facility] [address], [place], [place] [address]  Magnesium     Status: None   Collection Time: 03/10/18  4:32 AM  Result Value Ref Range   Magnesium 1.8 1.7 - 2.4 mg/dL    Comment: Performed at [facility] [address] [place], [place] [address]  Glucose, capillary     Status: Abnormal   Collection Time: 03/10/18  7:52 AM  Result Value Ref Range   Glucose-Capillary 105 (H) 65 - 99 mg/dL  Vitamin B12     Status: None   Collection Time: 03/10/18  8:11 AM  Result Value Ref Range   Vitamin B-12 874 180 - 914 pg/mL    Comment: (NOTE) This assay is not validated for testing neonatal or myeloproliferative syndrome specimens for Vitamin B12 levels. Performed at [facility], [place] [address], [place], [place] [address]   Iron and TIBC     Status: Abnormal   Collection Time: 03/10/18  8:11 AM  Result Value Ref Range   Iron 12 (L) 45 - 182 ug/dL   TIBC 346 250 -  450 ug/dL   Saturation Ratios 3 (L) 17.9 - 39.5 %   UIBC 334 ug/dL    Comment: Performed at [facility], [place] [address] [place], [place] [address]  Ferritin     Status: None   Collection Time: 03/10/18  8:11 AM  Result Value Ref Range   Ferritin 43 24 - 336 ng/mL    Comment: Performed at [facility], [place] [address], [place], [place] [address]  Reticulocytes     Status: Abnormal   Collection Time: 03/10/18  8:11 AM  Result Value Ref Range   Retic Ct Pct 1.4 0.4 - 3.1 %   RBC. 3.55 (L) 4.22 - 5.81 MIL/uL   Retic Count, Absolute 49.7 19.0 - 186.0 K/uL    Comment: Performed at [facility] [address], [place], [place] [address]  Folate     Status: None   Collection Time: 03/10/18  8:11 AM  Result Value Ref  Range   Folate 26.3 >5.9 ng/mL    Comment: Performed at [facility], [place] [address], [place], [place] [address]  Type and screen [place]     Status: None (Preliminary result)   Collection Time: 03/10/18 10:30 AM  Result Value Ref Range   ABO/RH(D) A POS    Antibody Screen NEG    Sample Expiration 03/13/2018    Unit Number [id number]    Blood Component Type RED CELLS,LR    Unit division 00    Status of Unit ISSUED    Transfusion Status OK TO TRANSFUSE    Crossmatch Result      Compatible Performed at [facility], [place] [address], [place], [place] [address]   Prepare RBC     Status: None   Collection Time: 03/10/18 10:30 AM  Result Value Ref Range   Order Confirmation      ORDER PROCESSED BY BLOOD BANK Performed at [facility], [place] [address], [place],  [address]     No results found.  Review of Systems  Neurological: Dizziness:  years of GI care.   Blood pressure 133/74, pulse 77, temperature 99.4 F (37.4 C), temperature source Oral, resp. rate 18, height '5\' 11"'  (1.803 m), weight 120.2 kg (265 lb), SpO2 99 %. Physical Examvital signs stable afebrile no acute distress abdomen is soft  nontender good bowel sounds heart and lungs okay patient lying comfortably in the bed iron studies a little low BUN and creatinine okay liver tests okay CT reviewed  Assessment/Plan: Anemia in a patient on blood thinners guaiac pending Plan: We discussed endoscopy with the patient and his wife and will proceed tomorrow on blood thinners with a slight increased risk with further workup and plans pending those findings  Stanford E 03/10/2018, 2:50 PM

## 2018-03-10 NOTE — Progress Notes (Signed)
PROGRESS NOTE    Grant [name]  RCB:[id number] DOB: 1951/02/06 DOA: 03/07/2018 PCP: Shirline Frees, MD   Brief Narrative: Patient says that symptoms started after he atey.o.malewith medical history significant ofhypertension, asthma, GERD, hypothyroidism, gout, depression with anxiety, BPH, obesity, OSA on CPAP, PAF on Xarelto, chronic of bronchitis, S/P AVR, who presents with fever and chills.  His wife states that ptsuddenly started having fever no chills when they weresitting on the porch. Pt's face and upper chest appeared flushed. Nono rashes.Pthad episode ofcentralchest pain, which hascompletely resolvednow.No cough or shortness breath. Pt reports diffuse myalgias.Patient states that he did not take any new medications. No insect bite. Denies not nausea, vomiting, diarrhea, abdominal pain    Assessment & Plan:   Principal Problem:   Sepsis ([place]) Active Problems:   Essential hypertension   GERD (gastroesophageal reflux disease)   PAF (paroxysmal atrial fibrillation) (HCC)   Chronic anticoagulation   Asthma   Hypothyroidism   OSA on CPAP   BPH (benign prostatic hyperplasia)   1-Diverticulitis;  CT abdomen showed mild diverticulitis.  Spike fever last night at 101. On admission 103.  Denies worsening abdominal pain.  Continue with ciprofloxacin and flagyl.    Anemia; hb 6 month ago at 12. On admission at 9. slowly decreasing today at 7.9. Check anemia panel.  Transfuse one unit due to heart history.  GI consulted. Patient on xarelto  Check occult blood.   HTN; continue with metoprolol   PVC; continue with metoprolol/ EKG sinus. Check Mg level.    History of A fib; on metoprolol and xarelto.   Asthma; stable.  BPH; Hypothyroidism; continue with synthroid.   Hematuria-UA shows more than 50 RBCs per high-power field. Dr Frederich Chick spoke with urology Dr. Jeffie Pollock, patient will need outpatient cystoscopy.  He can follow-up with urology after  discharge.    DVT prophylaxis:xarelto  Code Status:full code.  Family Communication: wife at bedside.  Disposition Plan: (specify when and where you expect patient to be discharged). Include barriers to DC in this tab.   Consultants:   GI    Procedures:   none   Antimicrobials:  Ciprofloxacin  Flagyl   Subjective: He had some chills last night. Some cramping pain.  He also report feeling his heart flipping.    Objective: Vitals:   03/09/18 2133 03/09/18 2215 03/09/18 2324 03/10/18 0450  BP: 129/69   124/79  Pulse: 97   (!) 55  Resp: 16   12  Temp: (!) 101 F (38.3 C) (!) 100.4 F (38 C) 99.2 F (37.3 C) 97.7 F (36.5 C)  TempSrc: Oral Oral Oral Oral  SpO2: 95%   98%  Weight:      Height:        Intake/Output Summary (Last 24 hours) at 03/10/2018 0912 Last data filed at 03/10/2018 0451 Gross per 24 hour  Intake 1644 ml  Output 2115 ml  Net -471 ml   Filed Weights   03/07/18 1915  Weight: 120.2 kg (265 lb)    Examination:  General exam: Appears calm and comfortable  Respiratory system: Clear to auscultation. Respiratory effort normal. Cardiovascular system: S1 & S2 heard, RRR. No JVD, murmurs, rubs, gallops or clicks. No pedal edema. Gastrointestinal system: Abdomen is nondistended, soft and mild tender. No organomegaly or masses felt. Normal bowel sounds heard. Central nervous system: Alert and oriented. No focal neurological deficits. Extremities: Symmetric 5 x 5 power. Skin: No rashes, lesions or ulcers    Data Reviewed: I have personally reviewed following  labs and imaging studies  CBC: Recent Labs  Lab 03/07/18 2002 03/08/18 0648 03/09/18 0338 03/10/18 0432  WBC 12.8* 9.7 7.0 8.1  NEUTROABS 10.5*  --   --   --   HGB 9.3* 8.3* 8.0* 7.9*  HCT 33.0* 29.0* 28.5* 28.0*  MCV 78.2 78.2 78.9 77.8*  PLT 204 163 161 527*   Basic Metabolic Panel: Recent Labs  Lab 03/07/18 2002 03/08/18 0648 03/09/18 0338 03/10/18 0432  NA 141 139 139  141  K 4.0 3.8 3.9 3.9  CL 107 107 105 106  CO2 26 22 27 29   GLUCOSE 102* 115* 109* 113*  BUN 16 11 8 6   CREATININE 1.10 0.95 1.11 1.01  CALCIUM 8.9 8.2* 8.3* 8.9   GFR: Estimated Creatinine Clearance: 97.5 mL/min (by C-G formula based on SCr of 1.01 mg/dL). Liver Function Tests: Recent Labs  Lab 03/07/18 2002 03/09/18 0338  AST 24 38  ALT 22 34  ALKPHOS 60 43  BILITOT 0.8 0.8  PROT 6.1* 5.1*  ALBUMIN 3.7 2.7*   No results for input(s): LIPASE, AMYLASE in the last 168 hours. No results for input(s): AMMONIA in the last 168 hours. Coagulation Profile: Recent Labs  Lab 03/08/18 0020  INR 1.31   Cardiac Enzymes: Recent Labs  Lab 03/07/18 2345 03/08/18 0020 03/08/18 0648 03/08/18 1755  CKTOTAL 83  --   --   --   TROPONINI  --  <0.03 <0.03 <0.03   BNP (last 3 results) No results for input(s): PROBNP in the last 8760 hours. HbA1C: No results for input(s): HGBA1C in the last 72 hours. CBG: Recent Labs  Lab 03/09/18 0742 03/10/18 0752  GLUCAP 91 105*   Lipid Profile: No results for input(s): CHOL, HDL, LDLCALC, TRIG, CHOLHDL, LDLDIRECT in the last 72 hours. Thyroid Function Tests: No results for input(s): TSH, T4TOTAL, FREET4, T3FREE, THYROIDAB in the last 72 hours. Anemia Panel: Recent Labs    03/10/18 0811  RETICCTPCT 1.4   Sepsis Labs: Recent Labs  Lab 03/07/18 1945 03/08/18 0020  PROCALCITON  --  0.57  LATICACIDVEN 1.74 1.9    Recent Results (from the past 240 hour(s))  Respiratory Panel by PCR     Status: None   Collection Time: 03/07/18  7:47 PM  Result Value Ref Range Status   Adenovirus NOT DETECTED NOT DETECTED Final   Coronavirus 229E NOT DETECTED NOT DETECTED Final   Coronavirus HKU1 NOT DETECTED NOT DETECTED Final   Coronavirus NL63 NOT DETECTED NOT DETECTED Final   Coronavirus OC43 NOT DETECTED NOT DETECTED Final   Metapneumovirus NOT DETECTED NOT DETECTED Final   Rhinovirus / Enterovirus NOT DETECTED NOT DETECTED Final    Influenza A NOT DETECTED NOT DETECTED Final   Influenza B NOT DETECTED NOT DETECTED Final   Parainfluenza Virus 1 NOT DETECTED NOT DETECTED Final   Parainfluenza Virus 2 NOT DETECTED NOT DETECTED Final   Parainfluenza Virus 3 NOT DETECTED NOT DETECTED Final   Parainfluenza Virus 4 NOT DETECTED NOT DETECTED Final   Respiratory Syncytial Virus NOT DETECTED NOT DETECTED Final   Bordetella pertussis NOT DETECTED NOT DETECTED Final   Chlamydophila pneumoniae NOT DETECTED NOT DETECTED Final   Mycoplasma pneumoniae NOT DETECTED NOT DETECTED Final    Comment: Performed at [facility], [place] [address] [place], [place] [address]  Blood culture (routine x 2)     Status: None (Preliminary result)   Collection Time: 03/07/18 10:25 PM  Result Value Ref Range Status   Specimen Description BLOOD  LEFT ANTECUBITAL  Final   Special Requests   Final    BOTTLES DRAWN AEROBIC ONLY Blood Culture results may not be optimal due to an inadequate volume of blood received in culture bottles   Culture   Final    NO GROWTH 2 DAYS Performed at [facility], [place] [address], [place], [place] [address]    Report Status PENDING  Incomplete  Blood culture (routine x 2)     Status: None (Preliminary result)   Collection Time: 03/07/18 11:22 PM  Result Value Ref Range Status   Specimen Description BLOOD RIGHT ANTECUBITAL  Final   Special Requests   Final    BOTTLES DRAWN AEROBIC AND ANAEROBIC Blood Culture adequate volume   Culture   Final    NO GROWTH 2 DAYS Performed at [facility], [place] [address], [place], [place] [address]    Report Status PENDING  Incomplete  Urine Culture     Status: None   Collection Time: 03/08/18  1:18 AM  Result Value Ref Range Status   Specimen Description URINE, RANDOM  Final   Special Requests NONE  Final   Culture   Final    NO GROWTH Performed at [facility], [address] [place], [place] [address]    Report Status 03/09/2018 FINAL  Final          Radiology Studies: Ct Abdomen Pelvis W Contrast  Result Date: 03/08/2018 CLINICAL DATA:  Sudden onset of chills.  Central chest pain. EXAM: CT ABDOMEN AND PELVIS WITH CONTRAST TECHNIQUE: Multidetector CT imaging of the abdomen and pelvis was performed using the standard protocol following bolus administration of intravenous contrast. CONTRAST:  155mL OMNIPAQUE IOHEXOL 300 MG/ML  SOLN COMPARISON:  01/14/2017 FINDINGS: Lower chest: Trace bilateral pleural effusions. Hepatobiliary: No focal liver abnormality is seen. Status post cholecystectomy. No biliary dilatation. Pancreas: Unremarkable. No pancreatic ductal dilatation or surrounding inflammatory changes. Spleen: Normal in size without focal abnormality. Adrenals/Urinary Tract: Adrenal glands are unremarkable. Kidneys are normal, without renal calculi, focal lesion, or hydronephrosis. Bladder is unremarkable. Stomach/Bowel: Stomach is within normal limits. Appendix appears normal. No bowel dilatation to suggest obstruction. No pneumatosis, pneumoperitoneum or portal venous gas. Diverticulosis of the sigmoid colon with mild peri diverticular inflammatory changes involving the proximal sigmoid colon most concerning for acute diverticulitis. No peridiverticular fluid collection. Vascular/Lymphatic: Normal caliber abdominal aorta with mild atherosclerosis. No lymphadenopathy. Reproductive: Prostate is unremarkable. Other: No abdominal wall hernia or abnormality. No abdominopelvic ascites. Musculoskeletal: No acute osseous abnormality. No aggressive osseous lesion. Severe bilateral facet arthropathy at L4-5 and L5-S1. IMPRESSION: 1. Diverticulosis of the sigmoid colon with mild diverticulitis. No peridiverticular fluid collection to suggest an abscess. Electronically Signed   By: Kathreen Devoid   On: 03/08/2018 10:05        Scheduled Meds: . acidophilus  1 capsule Oral Q breakfast  . aspirin EC  81 mg Oral Daily  . cholecalciferol  5,000 Units  Oral Q breakfast  . colchicine  0.6 mg Oral Daily  . finasteride  5 mg Oral QHS  . levothyroxine  112 mcg Oral QAC breakfast  . liothyronine  25 mcg Oral Daily  . loratadine  10 mg Oral Daily  . metoprolol tartrate  50 mg Oral BID  . mometasone-formoterol  2 puff Inhalation BID  . multivitamin with minerals  1 tablet Oral Q breakfast  . pantoprazole  40 mg Oral Daily  . rivaroxaban  20 mg Oral Q supper  .  tamsulosin  0.4 mg Oral QPC supper  . venlafaxine XR  150 mg Oral Q breakfast  . vitamin B-12  1,200 mcg Oral Daily   Continuous Infusions: . ciprofloxacin 400 mg (03/10/18 0739)  . metronidazole Stopped (03/10/18 0256)     LOS: 3 days    Time spent: 35 minutes.     Elmarie Shiley, MD [facility] Pager [phone]  If 7PM-7AM, please contact night-coverage [email or web address] Password [facility] 03/10/2018, 9:12 AM

## 2018-03-10 NOTE — Care Management Important Message (Signed)
Important Message  Patient Details  Name: Grant [name] MRN: [id number] Date of Birth: [birth year]   Medicare Important Message Given:  Yes    [name] 03/10/2018, 1:57 PM

## 2018-03-10 NOTE — Progress Notes (Signed)
Pt's spouse called out that she thought patient was "going back into Afib". Pt up to bathroom, denied dizziness, light-headedness. Pulse at radial artery regular. Telemetry reading sinus rhythm with PVCs, in and out of trigeminy. Pt reported feeling heart "flip-flopping". Pt's color remains good.

## 2018-03-11 ENCOUNTER — Inpatient Hospital Stay (HOSPITAL_COMMUNITY): Payer: Medicare Other | Admitting: Certified Registered"

## 2018-03-11 ENCOUNTER — Telehealth: Payer: Self-pay

## 2018-03-11 ENCOUNTER — Encounter (HOSPITAL_COMMUNITY): Payer: Self-pay | Admitting: Certified Registered"

## 2018-03-11 ENCOUNTER — Encounter (HOSPITAL_COMMUNITY)
Admission: EM | Disposition: A | Discharge status: Home / Self Care | Payer: Self-pay | Source: Home / Self Care | Attending: Family Medicine

## 2018-03-11 HISTORY — PX: ESOPHAGOGASTRODUODENOSCOPY (EGD) WITH PROPOFOL: SHX5813

## 2018-03-11 LAB — HEPATIC FUNCTION PANEL
ALT: 25 U/L (ref 17–63)
AST: 19 U/L (ref 15–41)
Albumin: 2.9 g/dL — ABNORMAL LOW (ref 3.5–5.0)
Alkaline Phosphatase: 44 U/L (ref 38–126)
Bilirubin, Direct: <0.1 mg/dL — ABNORMAL LOW (ref 0.1–0.5)
Total Bilirubin: 0.6 mg/dL (ref 0.3–1.2)
Total Protein: 5.7 g/dL — ABNORMAL LOW (ref 6.5–8.1)

## 2018-03-11 LAB — CBC
HCT: 31.1 % — ABNORMAL LOW (ref 39.0–52.0)
Hemoglobin: 9 g/dL — ABNORMAL LOW (ref 13.0–17.0)
MCH: 22.6 pg — ABNORMAL LOW (ref 26.0–34.0)
MCHC: 28.9 g/dL — ABNORMAL LOW (ref 30.0–36.0)
MCV: 77.9 fL — ABNORMAL LOW (ref 78.0–100.0)
Platelets: 179 10*3/uL (ref 150–400)
RBC: 3.99 MIL/uL — ABNORMAL LOW (ref 4.22–5.81)
RDW: 17.6 % — ABNORMAL HIGH (ref 11.5–15.5)
WBC: 4.8 10*3/uL (ref 4.0–10.5)

## 2018-03-11 LAB — BASIC METABOLIC PANEL
Anion gap: 9 (ref 5–15)
BUN: <5 mg/dL — ABNORMAL LOW (ref 6–20)
CO2: 28 mmol/L (ref 22–32)
Calcium: 8.8 mg/dL — ABNORMAL LOW (ref 8.9–10.3)
Chloride: 103 mmol/L (ref 101–111)
Creatinine, Ser: 0.94 mg/dL (ref 0.61–1.24)
GFR calc Af Amer: >60 mL/min (ref >60)
GFR calc non Af Amer: >60 mL/min (ref >60)
Glucose, Bld: 118 mg/dL — ABNORMAL HIGH (ref 65–99)
Potassium: 3.6 mmol/L (ref 3.5–5.1)
Sodium: 140 mmol/L (ref 135–145)

## 2018-03-11 LAB — TYPE AND SCREEN
ABO/RH(D): A POS
Antibody Screen: NEGATIVE
Unit division: 0

## 2018-03-11 LAB — BPAM RBC
Blood Product Expiration Date: 201906092359
ISSUE DATE / TIME: 201905221334
Unit Type and Rh: 6200

## 2018-03-11 LAB — GLUCOSE, CAPILLARY: Glucose-Capillary: 94 mg/dL (ref 65–99)

## 2018-03-11 SURGERY — ESOPHAGOGASTRODUODENOSCOPY (EGD) WITH PROPOFOL
Anesthesia: Monitor Anesthesia Care

## 2018-03-11 MED ORDER — METRONIDAZOLE 500 MG PO TABS: 500.0000 mg | ORAL_TABLET | Freq: Three times a day (TID) | ORAL | 0 refills | Status: AC

## 2018-03-11 MED ORDER — LACTATED RINGERS IV SOLN
INTRAVENOUS | Status: DC
  Administered 2018-03-11: 11:00:00 via INTRAVENOUS

## 2018-03-11 MED ORDER — CIPROFLOXACIN HCL 500 MG PO TABS: 500.0000 mg | ORAL_TABLET | Freq: Two times a day (BID) | ORAL | 0 refills | Status: AC

## 2018-03-11 MED ORDER — FERROUS SULFATE 325 (65 FE) MG PO TABS: 325.0000 mg | ORAL_TABLET | Freq: Two times a day (BID) | ORAL | Status: DC

## 2018-03-11 MED ORDER — EPHEDRINE SULFATE 50 MG/ML IJ SOLN
INTRAMUSCULAR | Status: AC
  Filled 2018-03-11: qty 3

## 2018-03-11 MED ORDER — FENTANYL CITRATE (PF) 250 MCG/5ML IJ SOLN
INTRAMUSCULAR | Status: AC
  Filled 2018-03-11: qty 5

## 2018-03-11 MED ORDER — FERROUS SULFATE 325 (65 FE) MG PO TABS: 325.0000 mg | ORAL_TABLET | Freq: Three times a day (TID) | ORAL | 0 refills | Status: DC

## 2018-03-11 MED ORDER — MIDAZOLAM HCL 2 MG/2ML IJ SOLN
INTRAMUSCULAR | Status: AC
  Filled 2018-03-11: qty 2

## 2018-03-11 MED ORDER — PROPOFOL 500 MG/50ML IV EMUL
INTRAVENOUS | Status: DC | PRN
  Administered 2018-03-11: 100 ug/kg/min via INTRAVENOUS

## 2018-03-11 MED ORDER — DOCUSATE SODIUM 100 MG PO CAPS: 100.0000 mg | ORAL_CAPSULE | Freq: Every day | ORAL | 2 refills | Status: DC | PRN

## 2018-03-11 MED ORDER — PROPOFOL 10 MG/ML IV BOLUS
INTRAVENOUS | Status: DC | PRN
  Administered 2018-03-11 (×3): 20 mg via INTRAVENOUS

## 2018-03-11 SURGICAL SUPPLY — 15 items

## 2018-03-11 NOTE — Progress Notes (Signed)
[place] 12:25 PM  Subjective: Patient without any new complaints and we answered all of their questions including when can he go home and reassurance about his mesh surgical options  Objective: Vital signs stable afebrile no acute distress exam please see preassessment evaluation labs stable  Assessment: Anemia probably secondary to blood thinners  Plan: Okay to proceed with endoscopy with anesthesia assistance  [facility]  Pager [phone] After 5PM or if no answer call [phone]

## 2018-03-11 NOTE — Discharge Summary (Signed)
Physician Discharge Summary  Grant [name] MCN:[id number] DOB: [birth year] DOA: 03/07/2018  PCP: Shirline Frees, MD  Admit date: 03/07/2018 Discharge date: 03/11/2018  Admitted From: Home  Disposition:  Home   Recommendations for Outpatient Follow-up:  1. Follow up with PCP in 1-2 weeks 2. Please obtain BMP/CBC in one week 3. Needs CBC to follow Hb level.  4. Follow up with Dr Watt Climes in 2 weeks for further evaluation of anemia.  5. Also unclear if hematuria has been playing a role in anemia. Needs to follow up with Urology.    Discharge Condition: stable.  CODE STATUS: full code.  Diet recommendation: Heart Healthy  Brief/Interim Summary:  Brief Narrative: Patient says that symptoms started after he atey.o.malewith medical history significant ofhypertension, asthma, GERD, hypothyroidism, gout, depression with anxiety, BPH, obesity, OSA on CPAP, PAF on Xarelto, chronic of bronchitis, S/P AVR, who presents with fever and chills.  His wife states that ptsuddenly started having fever no chills when they weresitting on the porch. Pt's face and upper chest appeared flushed. Nono rashes.Pthad episode ofcentralchest pain, which hascompletely resolvednow.No cough or shortness breath. Pt reports diffuse myalgias.Patient states that he did not take any new medications. No insect bite. Denies not nausea, vomiting, diarrhea, abdominal pain    Assessment & Plan:   Principal Problem:   Sepsis ([place]) Active Problems:   Essential hypertension   GERD (gastroesophageal reflux disease)   PAF (paroxysmal atrial fibrillation) (HCC)   Chronic anticoagulation   Asthma   Hypothyroidism   OSA on CPAP   BPH (benign prostatic hyperplasia)   1-Diverticulitis;  CT abdomen showed mild diverticulitis.  Spike fever last night at 101. On admission 103.  Afebrile today. WBC normal.  Continue with ciprofloxacin and flagyl day 3/10/ will provide prescription.  Improved.   Anemia;  Iron deficiency  hb 6 month ago at 12. On admission at 9. slowly decreasing today at 7.9.  anemia panel consistent with iron deficiency anemia.  HB post transfusion at 9/  GI consulted. Patient on xarelto and aspirin.  Occult blood negative.  Bili normal, no evidence of hemolysis.  Hb increase appropriately after one unit PRBC> Endoscopy didn't showed source of bleeding. Plan to follow up with Dr Watt Climes in 2 weeks for further evaluation.  Patient will be started on on Iron supplements.  Unclear if microscopic hematuria might be playing a role also.  Discussed with Dr Irish Lack, with cardiology, if patient needed to be on both aspirin and xarelto. After evaluation and consideration of risk and benefit plan is to hold aspirin. I discussed with patient.   HTN; continue with metoprolol   PVC; continue with metoprolol/ EKG sinus. Magnesium replaced.    History of A fib; on metoprolol and xarelto.   Asthma; stable.  BPH; Hypothyroidism; continue with synthroid.   Hematuria-UA shows more than 50 RBCs per high-power field. Dr Frederich Chick spoke with urology Dr. Jeffie Pollock, patient will need outpatient cystoscopy. He can follow-up with urology after discharge.     Discharge Diagnoses:  Principal Problem:   Sepsis ([place]) Active Problems:   Essential hypertension   GERD (gastroesophageal reflux disease)   PAF (paroxysmal atrial fibrillation) (HCC)   Chronic anticoagulation   Asthma   Hypothyroidism   OSA on CPAP   BPH (benign prostatic hyperplasia)    Discharge Instructions  Discharge Instructions    Diet - low sodium heart healthy   Complete by:  As directed    Increase activity slowly   Complete by:  As directed  Allergies as of 03/11/2018      Reactions   Amiodarone Swelling   Codeine Itching   Tramadol    dellusion      Medication List    STOP taking these medications   amoxicillin 500 MG tablet Commonly known as:  AMOXIL   aspirin 81 MG EC tablet   benzonatate  200 MG capsule Commonly known as:  TESSALON     TAKE these medications   acetaminophen 500 MG tablet Commonly known as:  TYLENOL Take 500 mg by mouth every 6 (six) hours as needed for moderate pain.   albuterol 108 (90 Base) MCG/ACT inhaler Commonly known as:  VENTOLIN HFA INHALE 2 PUFFS INTO LUNGS EVERY 6 HOURS AS NEEDED FOR WHEEZING OR SHORTNESS OF BREATH   ALIGN 4 MG Caps Take 4 mg by mouth daily with breakfast.   ALZAIR ALLERGY NASAL SPRAY NA Place 1 spray into both nostrils daily as needed (for allergies).   azelastine 0.1 % nasal spray Commonly known as:  ASTELIN Place 1-2 sprays into the nose 2 (two) times daily as needed for rhinitis or allergies.   BLUE-EMU MAXIMUM STRENGTH EX Apply 1 application topically 4 (four) times daily as needed (for arthritis pain.).   budesonide-formoterol 160-4.5 MCG/ACT inhaler Commonly known as:  SYMBICORT Inhale 2 puffs into the lungs 2 (two) times daily.   ciprofloxacin 500 MG tablet Commonly known as:  CIPRO Take 1 tablet (500 mg total) by mouth 2 (two) times daily for 3 days.   CLARITIN 10 MG tablet Generic drug:  loratadine Take 10 mg by mouth daily.   COLCRYS 0.6 MG tablet Generic drug:  colchicine Take 0.6 mg by mouth daily. With breakfast   dextromethorphan 30 MG/5ML liquid Commonly known as:  DELSYM Take 30 mg by mouth at bedtime as needed for cough.   DHEA 50 MG Tabs Take 50 mg by mouth daily with breakfast.   docusate sodium 100 MG capsule Commonly known as:  COLACE Take 1 capsule (100 mg total) by mouth daily as needed.   EPINEPHrine 0.3 mg/0.3 mL Soaj injection Commonly known as:  EPI-PEN Inject 0.3 mLs (0.3 mg total) into the muscle as needed (for angioedema).   ferrous sulfate 325 (65 FE) MG tablet Take 1 tablet (325 mg total) by mouth 3 (three) times daily with meals.   finasteride 5 MG tablet Commonly known as:  PROSCAR Take 5 mg by mouth at bedtime.   levothyroxine 112 MCG tablet Commonly known  as:  SYNTHROID, LEVOTHROID Take 112 mcg by mouth daily before breakfast.   liothyronine 25 MCG tablet Commonly known as:  CYTOMEL Take 25 mcg by mouth daily.   metoprolol tartrate 50 MG tablet Commonly known as:  LOPRESSOR TAKE 1 TABLET(50 MG) BY MOUTH TWICE DAILY   metroNIDAZOLE 500 MG tablet Commonly known as:  FLAGYL Take 1 tablet (500 mg total) by mouth 3 (three) times daily for 6 days.   omeprazole 40 MG capsule Commonly known as:  PRILOSEC Take 1 capsule (40 mg total) by mouth 2 (two) times daily.   ONE-A-DAY MENS HEALTH FORMULA PO Take 1 tablet by mouth daily with breakfast.   rivaroxaban 20 MG Tabs tablet Commonly known as:  XARELTO Take 1 tablet (20 mg total) daily with supper by mouth.   rizatriptan 10 MG tablet Commonly known as:  MAXALT Take 10 mg by mouth as needed for migraine. May repeat in 2 hours if needed   tamsulosin 0.4 MG Caps capsule Commonly known as:  FLOMAX Take 0.4 mg by mouth daily after supper.   testosterone cypionate 100 MG/ML injection Commonly known as:  DEPOTESTOTERONE CYPIONATE Inject 200 mg into the muscle every Thursday. For IM use only   tetrahydrozoline-zinc 0.05-0.25 % ophthalmic solution Commonly known as:  VISINE-AC Place 1 drop into both eyes as needed (dry eyes).   venlafaxine XR 150 MG 24 hr capsule Commonly known as:  EFFEXOR-XR Take 150 mg by mouth daily with breakfast.   VITAMIN B12 SL Place 1,200 mcg under the tongue daily. 1 dropper   Vitamin D-3 5000 units Tabs Take 5,000 Units by mouth daily with breakfast.      Follow-up Information    Call Irine Seal, MD.   Specialty:  Urology Why:  Call the office for an appt if you have not heard from [place] by discharge.  You can see me or any of my associates.  Contact information: [address] [place] [address] [phone]          Allergies  Allergen Reactions  . Amiodarone Swelling  . Codeine Itching  . Tramadol     dellusion      Consultations:  GI    Procedures/Studies: Dg Chest 2 View  Result Date: 03/07/2018 CLINICAL DATA:  Chest pain EXAM: CHEST - 2 VIEW COMPARISON:  08/05/2017 FINDINGS: Prior median sternotomy and valve replacement. Mild cardiomegaly. No confluent airspace opacities or effusions. No acute bony abnormality. IMPRESSION: Mild cardiomegaly.  No active disease. Electronically Signed   By: Rolm Baptise M.D.   On: 03/07/2018 20:15   Ct Abdomen Pelvis W Contrast  Result Date: 03/08/2018 CLINICAL DATA:  Sudden onset of chills.  Central chest pain. EXAM: CT ABDOMEN AND PELVIS WITH CONTRAST TECHNIQUE: Multidetector CT imaging of the abdomen and pelvis was performed using the standard protocol following bolus administration of intravenous contrast. CONTRAST:  162mL OMNIPAQUE IOHEXOL 300 MG/ML  SOLN COMPARISON:  01/14/2017 FINDINGS: Lower chest: Trace bilateral pleural effusions. Hepatobiliary: No focal liver abnormality is seen. Status post cholecystectomy. No biliary dilatation. Pancreas: Unremarkable. No pancreatic ductal dilatation or surrounding inflammatory changes. Spleen: Normal in size without focal abnormality. Adrenals/Urinary Tract: Adrenal glands are unremarkable. Kidneys are normal, without renal calculi, focal lesion, or hydronephrosis. Bladder is unremarkable. Stomach/Bowel: Stomach is within normal limits. Appendix appears normal. No bowel dilatation to suggest obstruction. No pneumatosis, pneumoperitoneum or portal venous gas. Diverticulosis of the sigmoid colon with mild peri diverticular inflammatory changes involving the proximal sigmoid colon most concerning for acute diverticulitis. No peridiverticular fluid collection. Vascular/Lymphatic: Normal caliber abdominal aorta with mild atherosclerosis. No lymphadenopathy. Reproductive: Prostate is unremarkable. Other: No abdominal wall hernia or abnormality. No abdominopelvic ascites. Musculoskeletal: No acute osseous abnormality. No aggressive  osseous lesion. Severe bilateral facet arthropathy at L4-5 and L5-S1. IMPRESSION: 1. Diverticulosis of the sigmoid colon with mild diverticulitis. No peridiverticular fluid collection to suggest an abscess. Electronically Signed   By: Kathreen Devoid   On: 03/08/2018 10:05     Subjective: He is feeling better, denies abdominal pain    Discharge Exam: Vitals:   03/11/18 1444 03/11/18 1446  BP: (!) 152/81   Pulse: (!) 56 61  Resp: 20   Temp: 98 F (36.7 C)   SpO2: 100%    Vitals:   03/11/18 1250 03/11/18 1257 03/11/18 1444 03/11/18 1446  BP:  140/82 (!) 152/81   Pulse: (!) 58 (!) 55 (!) 56 61  Resp: 19 20 20    Temp:   98 F (36.7 C)   TempSrc:  Oral   SpO2: 96% 100% 100%   Weight:      Height:        General: Pt is alert, awake, not in acute distress Cardiovascular: RRR, S1/S2 +, no rubs, no gallops Respiratory: CTA bilaterally, no wheezing, no rhonchi Abdominal: Soft, NT, ND, bowel sounds + Extremities: no edema, no cyanosis    The results of significant diagnostics from this hospitalization (including imaging, microbiology, ancillary and laboratory) are listed below for reference.     Microbiology: Recent Results (from the past 240 hour(s))  Respiratory Panel by PCR     Status: None   Collection Time: 03/07/18  7:47 PM  Result Value Ref Range Status   Adenovirus NOT DETECTED NOT DETECTED Final   Coronavirus 229E NOT DETECTED NOT DETECTED Final   Coronavirus HKU1 NOT DETECTED NOT DETECTED Final   Coronavirus NL63 NOT DETECTED NOT DETECTED Final   Coronavirus OC43 NOT DETECTED NOT DETECTED Final   Metapneumovirus NOT DETECTED NOT DETECTED Final   Rhinovirus / Enterovirus NOT DETECTED NOT DETECTED Final   Influenza A NOT DETECTED NOT DETECTED Final   Influenza B NOT DETECTED NOT DETECTED Final   Parainfluenza Virus 1 NOT DETECTED NOT DETECTED Final   Parainfluenza Virus 2 NOT DETECTED NOT DETECTED Final   Parainfluenza Virus 3 NOT DETECTED NOT DETECTED Final    Parainfluenza Virus 4 NOT DETECTED NOT DETECTED Final   Respiratory Syncytial Virus NOT DETECTED NOT DETECTED Final   Bordetella pertussis NOT DETECTED NOT DETECTED Final   Chlamydophila pneumoniae NOT DETECTED NOT DETECTED Final   Mycoplasma pneumoniae NOT DETECTED NOT DETECTED Final    Comment: Performed at [facility], [address], [place], [place] [address]  Blood culture (routine x 2)     Status: None (Preliminary result)   Collection Time: 03/07/18 10:25 PM  Result Value Ref Range Status   Specimen Description BLOOD LEFT ANTECUBITAL  Final   Special Requests   Final    BOTTLES DRAWN AEROBIC ONLY Blood Culture results may not be optimal due to an inadequate volume of blood received in culture bottles   Culture   Final    NO GROWTH 4 DAYS Performed at [facility], [place] [address] [place], [place] [address]    Report Status PENDING  Incomplete  Blood culture (routine x 2)     Status: None (Preliminary result)   Collection Time: 03/07/18 11:22 PM  Result Value Ref Range Status   Specimen Description BLOOD RIGHT ANTECUBITAL  Final   Special Requests   Final    BOTTLES DRAWN AEROBIC AND ANAEROBIC Blood Culture adequate volume   Culture   Final    NO GROWTH 4 DAYS Performed at [facility], [place] [address] [place], [place] [address]    Report Status PENDING  Incomplete  Urine Culture     Status: None   Collection Time: 03/08/18  1:18 AM  Result Value Ref Range Status   Specimen Description URINE, RANDOM  Final   Special Requests NONE  Final   Culture   Final    NO GROWTH Performed at [facility], [address] [place], [place] [address]    Report Status 03/09/2018 FINAL  Final  Culture, Urine     Status: None   Collection Time: 03/09/18  6:15 PM  Result Value Ref Range Status   Specimen Description URINE, CLEAN CATCH  Final   Special Requests NONE  Final   Culture   Final  NO GROWTH Performed at [facility], [place] [address],  [place],  [address]    Report Status 03/10/2018 FINAL  Final     Labs: BNP (last 3 results) Recent Labs    03/08/18 1755  BNP 196.2*   Basic Metabolic Panel: Recent Labs  Lab 03/07/18 2002 03/08/18 0648 03/09/18 0338 03/10/18 0432 03/11/18 0921  NA 141 139 139 141 140  K 4.0 3.8 3.9 3.9 3.6  CL 107 107 105 106 103  CO2 26 22 27 29 28   GLUCOSE 102* 115* 109* 113* 118*  BUN 16 11 8 6  <5*  CREATININE 1.10 0.95 1.11 1.01 0.94  CALCIUM 8.9 8.2* 8.3* 8.9 8.8*  MG  --   --   --  1.8  --    Liver Function Tests: Recent Labs  Lab 03/07/18 2002 03/09/18 0338 03/11/18 0921  AST 24 38 19  ALT 22 34 25  ALKPHOS 60 43 44  BILITOT 0.8 0.8 0.6  PROT 6.1* 5.1* 5.7*  ALBUMIN 3.7 2.7* 2.9*   No results for input(s): LIPASE, AMYLASE in the last 168 hours. No results for input(s): AMMONIA in the last 168 hours. CBC: Recent Labs  Lab 03/07/18 2002 03/08/18 2297 03/09/18 0338 03/10/18 0432 03/11/18 0921  WBC 12.8* 9.7 7.0 8.1 4.8  NEUTROABS 10.5*  --   --   --   --   HGB 9.3* 8.3* 8.0* 7.9* 9.0*  HCT 33.0* 29.0* 28.5* 28.0* 31.1*  MCV 78.2 78.2 78.9 77.8* 77.9*  PLT 204 163 161 148* 179   Cardiac Enzymes: Recent Labs  Lab 03/07/18 2345 03/08/18 0020 03/08/18 0648 03/08/18 1755  CKTOTAL 83  --   --   --   TROPONINI  --  <0.03 <0.03 <0.03   BNP: Invalid input(s): POCBNP CBG: Recent Labs  Lab 03/09/18 0742 03/10/18 0752 03/11/18 0754  GLUCAP 91 105* 94   D-Dimer No results for input(s): DDIMER in the last 72 hours. Hgb A1c No results for input(s): HGBA1C in the last 72 hours. Lipid Profile No results for input(s): CHOL, HDL, LDLCALC, TRIG, CHOLHDL, LDLDIRECT in the last 72 hours. Thyroid function studies No results for input(s): TSH, T4TOTAL, T3FREE, THYROIDAB in the last 72 hours.  Invalid input(s): FREET3 Anemia work up Recent Labs    03/10/18 0811  VITAMINB12 874  FOLATE 26.3  FERRITIN 43  TIBC 346  IRON 12*  RETICCTPCT 1.4   Urinalysis     Component Value Date/Time   COLORURINE YELLOW 03/07/2018 2036   APPEARANCEUR CLEAR 03/07/2018 2036   LABSPEC 1.018 03/07/2018 2036   PHURINE 5.0 03/07/2018 2036   GLUCOSEU NEGATIVE 03/07/2018 2036   HGBUR MODERATE (A) 03/07/2018 2036   BILIRUBINUR NEGATIVE 03/07/2018 2036   KETONESUR NEGATIVE 03/07/2018 2036   PROTEINUR NEGATIVE 03/07/2018 2036   UROBILINOGEN 0.2 01/11/2008 1105   NITRITE NEGATIVE 03/07/2018 2036   LEUKOCYTESUR NEGATIVE 03/07/2018 2036   Sepsis Labs Invalid input(s): PROCALCITONIN,  WBC,  LACTICIDVEN Microbiology Recent Results (from the past 240 hour(s))  Respiratory Panel by PCR     Status: None   Collection Time: 03/07/18  7:47 PM  Result Value Ref Range Status   Adenovirus NOT DETECTED NOT DETECTED Final   Coronavirus 229E NOT DETECTED NOT DETECTED Final   Coronavirus HKU1 NOT DETECTED NOT DETECTED Final   Coronavirus NL63 NOT DETECTED NOT DETECTED Final   Coronavirus OC43 NOT DETECTED NOT DETECTED Final   Metapneumovirus NOT DETECTED NOT DETECTED Final   Rhinovirus / Enterovirus NOT  DETECTED NOT DETECTED Final   Influenza A NOT DETECTED NOT DETECTED Final   Influenza B NOT DETECTED NOT DETECTED Final   Parainfluenza Virus 1 NOT DETECTED NOT DETECTED Final   Parainfluenza Virus 2 NOT DETECTED NOT DETECTED Final   Parainfluenza Virus 3 NOT DETECTED NOT DETECTED Final   Parainfluenza Virus 4 NOT DETECTED NOT DETECTED Final   Respiratory Syncytial Virus NOT DETECTED NOT DETECTED Final   Bordetella pertussis NOT DETECTED NOT DETECTED Final   Chlamydophila pneumoniae NOT DETECTED NOT DETECTED Final   Mycoplasma pneumoniae NOT DETECTED NOT DETECTED Final    Comment: Performed at [facility], [place] [address], [place], [place] [address]  Blood culture (routine x 2)     Status: None (Preliminary result)   Collection Time: 03/07/18 10:25 PM  Result Value Ref Range Status   Specimen Description BLOOD LEFT ANTECUBITAL  Final   Special Requests   Final     BOTTLES DRAWN AEROBIC ONLY Blood Culture results may not be optimal due to an inadequate volume of blood received in culture bottles   Culture   Final    NO GROWTH 4 DAYS Performed at [facility], [place] [address], [place], [place] [address]    Report Status PENDING  Incomplete  Blood culture (routine x 2)     Status: None (Preliminary result)   Collection Time: 03/07/18 11:22 PM  Result Value Ref Range Status   Specimen Description BLOOD RIGHT ANTECUBITAL  Final   Special Requests   Final    BOTTLES DRAWN AEROBIC AND ANAEROBIC Blood Culture adequate volume   Culture   Final    NO GROWTH 4 DAYS Performed at [facility], [place] [address] [place], [place] [address]    Report Status PENDING  Incomplete  Urine Culture     Status: None   Collection Time: 03/08/18  1:18 AM  Result Value Ref Range Status   Specimen Description URINE, RANDOM  Final   Special Requests NONE  Final   Culture   Final    NO GROWTH Performed at [facility], [address] [place], [place] [address]    Report Status 03/09/2018 FINAL  Final  Culture, Urine     Status: None   Collection Time: 03/09/18  6:15 PM  Result Value Ref Range Status   Specimen Description URINE, CLEAN CATCH  Final   Special Requests NONE  Final   Culture   Final    NO GROWTH Performed at [facility], [place] [address] [place], [place] [address]    Report Status 03/10/2018 FINAL  Final     Time coordinating discharge: 35 minutes.  SIGNED:   Elmarie Shiley, MD  [facility] 03/11/2018, 4:09 PM Pager   If 7PM-7AM, please contact night-coverage [email or web address] Password [email or web address]

## 2018-03-11 NOTE — Care Management Note (Signed)
Case Management Note  Patient Details  Name: Grant [name] MRN: [id number] Date of Birth: 17-Apr-1951  Subjective/Objective:       Sepsis/ diverticulitis.            PCP: Shirline Frees  Action/Plan: Transition to home.  Expected Discharge Date:   03/11/2018           Expected Discharge Plan:  Home/Self Care  In-House Referral:     Discharge planning Services  CM Consult  Post Acute Care Choice:    Choice offered to:     DME Arranged:   N/A DME Agency:   N/A  HH Arranged:   N/A HH Agency:   N/A  Status of Service:  completed  If discussed at Long Length of Stay Meetings, dates discussed:    Additional Comments:  Sharin Mons, RN 03/11/2018, 3:57 PM

## 2018-03-11 NOTE — Anesthesia Postprocedure Evaluation (Addendum)
Anesthesia Post Note  Patient: Grant [name]  Procedure(s) Performed: ESOPHAGOGASTRODUODENOSCOPY (EGD) WITH PROPOFOL (N/A )     Patient location during evaluation: PACU Anesthesia Type: MAC Level of consciousness: awake and alert Pain management: pain level controlled Vital Signs Assessment: post-procedure vital signs reviewed and stable Respiratory status: spontaneous breathing, nonlabored ventilation, respiratory function stable and patient connected to nasal cannula oxygen Cardiovascular status: stable and blood pressure returned to baseline Postop Assessment: no apparent nausea or vomiting Anesthetic complications: no    Last Vitals:  Vitals:   03/11/18 1444 03/11/18 1446  BP: (!) 152/81   Pulse: (!) 56 61  Resp: 20   Temp: 36.7 C   SpO2: 100%     Last Pain:  Vitals:   03/11/18 1444  TempSrc: Oral  PainSc:                  Effie Berkshire

## 2018-03-11 NOTE — Progress Notes (Signed)
Pt discharged to home. AVS printed and discharge instructions discussed with pt and spouse. Pt given prescriptions and discussed when to take medications. IV and telemetry removed. Pt taken down to private car via wheelchair. Pt left with all belongings and was in stable condition.

## 2018-03-11 NOTE — Op Note (Signed)
[facility] Patient Name: Grant [name] Procedure Date : 03/11/2018 MRN: [id number] Attending MD: Clarene Essex , MD Date of Birth: 02/24/1951 CSN: [id number] Age: 67 Admit Type: Inpatient Procedure:                Upper GI endoscopy Indications:              Iron deficiency anemia Providers:                Clarene Essex, MD, Kingsley Plan, RN, Elspeth Cho                            Tech., Technician, Claybon Jabs CRNA, CRNA Referring MD:              Medicines:                Propofol total dose 546 mg IV Complications:            No immediate complications. Estimated Blood Loss:     Estimated blood loss: none. Procedure:                Pre-Anesthesia Assessment:                           - Prior to the procedure, a History and Physical                            was performed, and patient medications and                            allergies were reviewed. The patient's tolerance of                            previous anesthesia was also reviewed. The risks                            and benefits of the procedure and the sedation                            options and risks were discussed with the patient.                            All questions were answered, and informed consent                            was obtained. Prior Anticoagulants: The patient has                            taken Xarelto (rivaroxaban), last dose was day of                            procedure. ASA Grade Assessment: III - A patient                            with severe systemic disease. After reviewing the  risks and benefits, the patient was deemed in                            satisfactory condition to undergo the procedure.                           After obtaining informed consent, the endoscope was                            passed under direct vision. Throughout the                            procedure, the patient's blood pressure, pulse, and              oxygen saturations were monitored continuously. The                            EG-[id number] ([id number]) scope was introduced through the                            mouth, and advanced to the third part of duodenum.                            The upper GI endoscopy was accomplished without                            difficulty. The patient tolerated the procedure                            well. Scope In: Scope Out: Findings:      The larynx was normal.      The examined esophagus was normal.      A single small semi-sessile polyp with no bleeding and no stigmata of       recent bleeding was found in the cardia.      The duodenal bulb, first portion of the duodenum, second portion of the       duodenum and third portion of the duodenum were normal.      The exam was otherwise without abnormality. Impression:               - Normal larynx.                           - Normal esophagus.                           - A single gastric polyp.                           - Normal duodenal bulb, first portion of the                            duodenum, second portion of the duodenum and third                            portion of the duodenum.                           -  The examination was otherwise normal.                           - No specimens collected. Recommendation:           - Patient has a contact number available for                            emergencies. The signs and symptoms of potential                            delayed complications were discussed with the                            patient. Return to normal activities tomorrow.                            Written discharge instructions were provided to the                            patient.                           - Soft diet today. okay to begin iron but warn                            about dark stools                           - Continue present medications. Reevaluate whether                            he truly  needs both Xarelto an aspirin                           - Return to GI clinic in 2 weeks.to recheck CBC and                            make sure no further workup plans are needed                           - Telephone GI clinic if symptomatic PRN. Procedure Code(s):        --- Professional ---                           [phone], Esophagogastroduodenoscopy, flexible,                            transoral; diagnostic, including collection of                            specimen(s) by brushing or washing, when performed                            (separate procedure) Diagnosis Code(s):        ---  Professional ---                           K31.7, Polyp of stomach and duodenum                           D50.9, Iron deficiency anemia, unspecified CPT copyright 2017 [employer]. All rights reserved. The codes documented in this report are preliminary and upon coder review may  be revised to meet current compliance requirements. Clarene Essex, MD 03/11/2018 12:50:22 PM This report has been signed electronically. Number of Addenda: 0

## 2018-03-11 NOTE — Progress Notes (Signed)
PROGRESS NOTE    Grant [name]  HMC:[id number] DOB: 1951/09/09 DOA: 03/07/2018 PCP: Shirline Frees, MD   Brief Narrative: Patient says that symptoms started after he atey.o.malewith medical history significant ofhypertension, asthma, GERD, hypothyroidism, gout, depression with anxiety, BPH, obesity, OSA on CPAP, PAF on Xarelto, chronic of bronchitis, S/P AVR, who presents with fever and chills.  His wife states that ptsuddenly started having fever no chills when they weresitting on the porch. Pt's face and upper chest appeared flushed. Nono rashes.Pthad episode ofcentralchest pain, which hascompletely resolvednow.No cough or shortness breath. Pt reports diffuse myalgias.Patient states that he did not take any new medications. No insect bite. Denies not nausea, vomiting, diarrhea, abdominal pain    Assessment & Plan:   Principal Problem:   Sepsis ([place]) Active Problems:   Essential hypertension   GERD (gastroesophageal reflux disease)   PAF (paroxysmal atrial fibrillation) (HCC)   Chronic anticoagulation   Asthma   Hypothyroidism   OSA on CPAP   BPH (benign prostatic hyperplasia)   1-Diverticulitis;  CT abdomen showed mild diverticulitis.  Spike fever last night at 101. On admission 103.  Afebrile today. WBC normal.  Continue with ciprofloxacin and flagyl.    Anemia; hb 6 month ago at 12. On admission at 9. slowly decreasing today at 7.9.  anemia panel consistent with iron deficiency anemia.  HB post transfusion at 9/  GI consulted. Patient on xarelto  Occult blood negative.  Plan for endoscopy today. Left message to Dr Nils Pyle per patient wife request.  Bili normal, no evidence of hemolysis.   HTN; continue with metoprolol   PVC; continue with metoprolol/ EKG sinus. Magnesium replaced.    History of A fib; on metoprolol and xarelto.   Asthma; stable.  BPH; Hypothyroidism; continue with synthroid.   Hematuria-UA shows more than 50 RBCs per  high-power field. Dr Frederich Chick spoke with urology Dr. Jeffie Pollock, patient will need outpatient cystoscopy.  He can follow-up with urology after discharge.    DVT prophylaxis:xarelto  Code Status:full code.  Family Communication: wife at bedside.  Disposition Plan: home when stable.    Consultants:   GI    Procedures:   none   Antimicrobials:  Ciprofloxacin  Flagyl   Subjective: He is feeling well today. Denies chest pain or dyspnea.    Objective: Vitals:   03/10/18 1622 03/10/18 2116 03/11/18 0543 03/11/18 1113  BP: (!) 144/82 140/85 134/85 (!) 165/78  Pulse: 82 75 (!) 54 (!) 55  Resp: 18 12 16 18   Temp: 98.4 F (36.9 C) (!) 97.5 F (36.4 C) 97.9 F (36.6 C) 97.9 F (36.6 C)  TempSrc: Oral Oral Oral Oral  SpO2: 100% 96% 97% 100%  Weight:    120.2 kg (265 lb)  Height:    5\' 11"  (1.803 m)    Intake/Output Summary (Last 24 hours) at 03/11/2018 1216 Last data filed at 03/11/2018 1037 Gross per 24 hour  Intake 1237 ml  Output 940 ml  Net 297 ml   Filed Weights   03/07/18 1915 03/11/18 1113  Weight: 120.2 kg (265 lb) 120.2 kg (265 lb)    Examination:  General exam: NAD Respiratory system: CTA, normal respiratory effort.  Cardiovascular system: S 1, S 2 RRR Gastrointestinal system: BS present, soft, nt Central nervous system: non focal.  Extremities: Symmetric power.  Skin: No rash.     Data Reviewed: I have personally reviewed following labs and imaging studies  CBC: Recent Labs  Lab 03/07/18 2002 03/08/18 6629 03/09/18 0338 03/10/18  6789 03/11/18 0921  WBC 12.8* 9.7 7.0 8.1 4.8  NEUTROABS 10.5*  --   --   --   --   HGB 9.3* 8.3* 8.0* 7.9* 9.0*  HCT 33.0* 29.0* 28.5* 28.0* 31.1*  MCV 78.2 78.2 78.9 77.8* 77.9*  PLT 204 163 161 148* 381   Basic Metabolic Panel: Recent Labs  Lab 03/07/18 2002 03/08/18 0648 03/09/18 0338 03/10/18 0432 03/11/18 0921  NA 141 139 139 141 140  K 4.0 3.8 3.9 3.9 3.6  CL 107 107 105 106 103  CO2 26 22 27 29 28     GLUCOSE 102* 115* 109* 113* 118*  BUN 16 11 8 6  <5*  CREATININE 1.10 0.95 1.11 1.01 0.94  CALCIUM 8.9 8.2* 8.3* 8.9 8.8*  MG  --   --   --  1.8  --    GFR: Estimated Creatinine Clearance: 104.8 mL/min (by C-G formula based on SCr of 0.94 mg/dL). Liver Function Tests: Recent Labs  Lab 03/07/18 2002 03/09/18 0338 03/11/18 0921  AST 24 38 19  ALT 22 34 25  ALKPHOS 60 43 44  BILITOT 0.8 0.8 0.6  PROT 6.1* 5.1* 5.7*  ALBUMIN 3.7 2.7* 2.9*   No results for input(s): LIPASE, AMYLASE in the last 168 hours. No results for input(s): AMMONIA in the last 168 hours. Coagulation Profile: Recent Labs  Lab 03/08/18 0020  INR 1.31   Cardiac Enzymes: Recent Labs  Lab 03/07/18 2345 03/08/18 0020 03/08/18 0648 03/08/18 1755  CKTOTAL 83  --   --   --   TROPONINI  --  <0.03 <0.03 <0.03   BNP (last 3 results) No results for input(s): PROBNP in the last 8760 hours. HbA1C: No results for input(s): HGBA1C in the last 72 hours. CBG: Recent Labs  Lab 03/09/18 0742 03/10/18 0752 03/11/18 0754  GLUCAP 91 105* 94   Lipid Profile: No results for input(s): CHOL, HDL, LDLCALC, TRIG, CHOLHDL, LDLDIRECT in the last 72 hours. Thyroid Function Tests: No results for input(s): TSH, T4TOTAL, FREET4, T3FREE, THYROIDAB in the last 72 hours. Anemia Panel: Recent Labs    03/10/18 0811  VITAMINB12 874  FOLATE 26.3  FERRITIN 43  TIBC 346  IRON 12*  RETICCTPCT 1.4   Sepsis Labs: Recent Labs  Lab 03/07/18 1945 03/08/18 0020  PROCALCITON  --  0.57  LATICACIDVEN 1.74 1.9    Recent Results (from the past 240 hour(s))  Respiratory Panel by PCR     Status: None   Collection Time: 03/07/18  7:47 PM  Result Value Ref Range Status   Adenovirus NOT DETECTED NOT DETECTED Final   Coronavirus 229E NOT DETECTED NOT DETECTED Final   Coronavirus HKU1 NOT DETECTED NOT DETECTED Final   Coronavirus NL63 NOT DETECTED NOT DETECTED Final   Coronavirus OC43 NOT DETECTED NOT DETECTED Final    Metapneumovirus NOT DETECTED NOT DETECTED Final   Rhinovirus / Enterovirus NOT DETECTED NOT DETECTED Final   Influenza A NOT DETECTED NOT DETECTED Final   Influenza B NOT DETECTED NOT DETECTED Final   Parainfluenza Virus 1 NOT DETECTED NOT DETECTED Final   Parainfluenza Virus 2 NOT DETECTED NOT DETECTED Final   Parainfluenza Virus 3 NOT DETECTED NOT DETECTED Final   Parainfluenza Virus 4 NOT DETECTED NOT DETECTED Final   Respiratory Syncytial Virus NOT DETECTED NOT DETECTED Final   Bordetella pertussis NOT DETECTED NOT DETECTED Final   Chlamydophila pneumoniae NOT DETECTED NOT DETECTED Final   Mycoplasma pneumoniae NOT DETECTED NOT DETECTED Final    Comment:  Performed at [facility], [place] [address] [place], [place] [address]  Blood culture (routine x 2)     Status: None (Preliminary result)   Collection Time: 03/07/18 10:25 PM  Result Value Ref Range Status   Specimen Description BLOOD LEFT ANTECUBITAL  Final   Special Requests   Final    BOTTLES DRAWN AEROBIC ONLY Blood Culture results may not be optimal due to an inadequate volume of blood received in culture bottles   Culture   Final    NO GROWTH 3 DAYS Performed at [facility], [place] [address], [place], [place] [address]    Report Status PENDING  Incomplete  Blood culture (routine x 2)     Status: None (Preliminary result)   Collection Time: 03/07/18 11:22 PM  Result Value Ref Range Status   Specimen Description BLOOD RIGHT ANTECUBITAL  Final   Special Requests   Final    BOTTLES DRAWN AEROBIC AND ANAEROBIC Blood Culture adequate volume   Culture   Final    NO GROWTH 3 DAYS Performed at [facility], [place] [address] [place], [place] [address]    Report Status PENDING  Incomplete  Urine Culture     Status: None   Collection Time: 03/08/18  1:18 AM  Result Value Ref Range Status   Specimen Description URINE, RANDOM  Final   Special Requests NONE  Final   Culture   Final    NO GROWTH Performed at [facility], [address] [place], [place] [address]    Report Status 03/09/2018 FINAL  Final  Culture, Urine     Status: None   Collection Time: 03/09/18  6:15 PM  Result Value Ref Range Status   Specimen Description URINE, CLEAN CATCH  Final   Special Requests NONE  Final   Culture   Final    NO GROWTH Performed at [facility], [place] [address], [place], [place] [address]    Report Status 03/10/2018 FINAL  Final         Radiology Studies: No results found.      Scheduled Meds: . [MAR Hold] acidophilus  1 capsule Oral Q breakfast  . [MAR Hold] aspirin EC  81 mg Oral Daily  . [MAR Hold] cholecalciferol  5,000 Units Oral Q breakfast  . [MAR Hold] colchicine  0.6 mg Oral Daily  . [MAR Hold] finasteride  5 mg Oral QHS  . [MAR Hold] levothyroxine  112 mcg Oral QAC breakfast  . [MAR Hold] liothyronine  25 mcg Oral Daily  . [MAR Hold] loratadine  10 mg Oral Daily  . [MAR Hold] metoprolol tartrate  50 mg Oral BID  . [MAR Hold] mometasone-formoterol  2 puff Inhalation BID  . [MAR Hold] multivitamin with minerals  1 tablet Oral Q breakfast  . [MAR Hold] pantoprazole  40 mg Oral Daily  . [MAR Hold] rivaroxaban  20 mg Oral Q supper  . [MAR Hold] tamsulosin  0.4 mg Oral QPC supper  . [MAR Hold] venlafaxine XR  150 mg Oral Q breakfast  . [MAR Hold] vitamin B-12  1,200 mcg Oral Daily   Continuous Infusions: . sodium chloride    . [MAR Hold] ciprofloxacin Stopped (03/11/18 0920)  . lactated ringers 10 mL/hr at 03/11/18 1119  . [MAR Hold] metronidazole 500 mg (03/11/18 0941)     LOS: 4 days    Time spent: 35 minutes.     Elmarie Shiley, MD [facility] Pager  [phone]  If 7PM-7AM, please contact night-coverage [email or web address] Password [facility] 03/11/2018, 12:16 PM

## 2018-03-11 NOTE — Telephone Encounter (Signed)
DOD call received from Dr. Tyrell Antonio. Patient is currently in the hospital. Patient with low hemoglobin. Patient with a history of PAF, Bicuspid Aortic Valve w/ Aortic Insuffiencey and Dilated Aortic Root- s/p [place] Procedure with tissue valve by Dr. Prescott Gum 07/02/17, and diverticulitis. Endoscopy was done that is negative for bleed. Patient is taking both ASA and Xarelto. Dr. Tyrell Antonio is wanting to know if patient needs to be on both ASA and Plavix.   Dr. Irish Lack spoke with Dr. Tyrell Antonio and let her know that the patient can hold ASA for now given patient's anemia and will forward the information to Dr. [name], the patient's primary cardiologist for further recommendation.

## 2018-03-11 NOTE — Anesthesia Preprocedure Evaluation (Addendum)
Anesthesia Evaluation  Patient identified by MRN, date of birth, ID band Patient awake    Reviewed: Allergy & Precautions, NPO status , Patient's Chart, lab work & pertinent test results  Airway Mallampati: III   Neck ROM: Full    Dental  (+) Dental Advisory Given, Teeth Intact   Pulmonary asthma , sleep apnea and Continuous Positive Airway Pressure Ventilation , former smoker,    breath sounds clear to auscultation       Cardiovascular hypertension, Pt. on home beta blockers + dysrhythmias Atrial Fibrillation  Rhythm:Regular Rate:Bradycardia     Neuro/Psych  Headaches, PSYCHIATRIC DISORDERS Anxiety Depression  Neuromuscular disease    GI/Hepatic GERD  Medicated,  Endo/Other  Hypothyroidism   Renal/GU   negative genitourinary   Musculoskeletal  (+) Arthritis , Fibromyalgia -  Abdominal (+) + obese,   Peds  Hematology negative hematology ROS (+)   Anesthesia Other Findings   Reproductive/Obstetrics                           Lab Results  Component Value Date   WBC 4.8 03/11/2018   HGB 9.0 (L) 03/11/2018   HCT 31.1 (L) 03/11/2018   MCV 77.9 (L) 03/11/2018   PLT 179 03/11/2018   Lab Results  Component Value Date   CREATININE 1.01 03/10/2018   BUN 6 03/10/2018   NA 141 03/10/2018   K 3.9 03/10/2018   CL 106 03/10/2018   CO2 29 03/10/2018   Lab Results  Component Value Date   INR 1.31 03/08/2018   INR 1.8 08/28/2017   INR 2.0 08/21/2017   EKG: normal sinus rhythm, PAC's noted.  Anesthesia Physical Anesthesia Plan  ASA: III  Anesthesia Plan: MAC   Post-op Pain Management:    Induction: Intravenous  PONV Risk Score and Plan: 3 and Propofol infusion  Airway Management Planned: Nasal Cannula  Additional Equipment: None  Intra-op Plan:   Post-operative Plan:   Informed Consent: I have reviewed the patients History and Physical, chart, labs and discussed the procedure  including the risks, benefits and alternatives for the proposed anesthesia with the patient or authorized representative who has indicated his/her understanding and acceptance.     Plan Discussed with: CRNA, Anesthesiologist and Surgeon  Anesthesia Plan Comments:       Anesthesia Quick Evaluation

## 2018-03-11 NOTE — Transfer of Care (Signed)
Immediate Anesthesia Transfer of Care Note  Patient: Grant [name]  Procedure(s) Performed: ESOPHAGOGASTRODUODENOSCOPY (EGD) WITH PROPOFOL (N/A )  Patient Location: Endoscopy Unit  Anesthesia Type:MAC  Level of Consciousness: drowsy and patient cooperative  Airway & Oxygen Therapy: Patient Spontanous Breathing and Patient connected to nasal cannula oxygen  Post-op Assessment: Report given to RN, Post -op Vital signs reviewed and stable and Patient moving all extremities  Post vital signs: Reviewed and stable  Last Vitals:  Vitals Value Taken Time  BP 123/81 03/11/2018 12:46 PM  Temp    Pulse 57 03/11/2018 12:46 PM  Resp 19 03/11/2018 12:46 PM  SpO2 96 % 03/11/2018 12:46 PM  Vitals shown include unvalidated device data.  Last Pain:  Vitals:   03/11/18 1113  TempSrc: Oral  PainSc: 0-No pain         Complications: No apparent anesthesia complications

## 2018-03-12 LAB — CULTURE, BLOOD (ROUTINE X 2)
Culture: NO GROWTH
Culture: NO GROWTH
Special Requests: ADEQUATE

## 2018-03-16 ENCOUNTER — Encounter (HOSPITAL_COMMUNITY): Payer: Self-pay | Admitting: Gastroenterology

## 2018-03-16 DIAGNOSIS — R319 Hematuria, unspecified: Secondary | ICD-10-CM | POA: Diagnosis not present

## 2018-03-16 DIAGNOSIS — K5792 Diverticulitis of intestine, part unspecified, without perforation or abscess without bleeding: Secondary | ICD-10-CM | POA: Diagnosis not present

## 2018-03-16 DIAGNOSIS — Z09 Encounter for follow-up examination after completed treatment for conditions other than malignant neoplasm: Secondary | ICD-10-CM | POA: Diagnosis not present

## 2018-03-16 DIAGNOSIS — D509 Iron deficiency anemia, unspecified: Secondary | ICD-10-CM | POA: Diagnosis not present

## 2018-03-16 NOTE — Addendum Note (Signed)
Addendum  created 03/16/18 1510 by Effie Berkshire, MD   Sign clinical note, SmartForm saved

## 2018-03-18 DIAGNOSIS — R3121 Asymptomatic microscopic hematuria: Secondary | ICD-10-CM | POA: Diagnosis not present

## 2018-03-18 DIAGNOSIS — N401 Enlarged prostate with lower urinary tract symptoms: Secondary | ICD-10-CM | POA: Diagnosis not present

## 2018-03-24 ENCOUNTER — Other Ambulatory Visit: Payer: Self-pay

## 2018-03-24 ENCOUNTER — Encounter: Payer: Self-pay | Admitting: Cardiothoracic Surgery

## 2018-03-24 ENCOUNTER — Ambulatory Visit: Payer: Self-pay | Admitting: Cardiothoracic Surgery

## 2018-03-24 VITALS — BP 141/82 | HR 59 | Resp 16 | Ht 71.0 in | Wt 265.0 lb

## 2018-03-24 DIAGNOSIS — Z9889 Other specified postprocedural states: Secondary | ICD-10-CM

## 2018-03-24 DIAGNOSIS — Z952 Presence of prosthetic heart valve: Secondary | ICD-10-CM

## 2018-03-24 DIAGNOSIS — Z95828 Presence of other vascular implants and grafts: Secondary | ICD-10-CM

## 2018-03-24 NOTE — Progress Notes (Signed)
PCP is Shirline Frees, MD Referring Provider is Josue Hector, MD  Chief Complaint  Patient presents with  . 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  Cardiac status is stable.  He has history of asymptomatic paroxysmal atrial fibrillation and is on Xarelto followed by his cardiologist Dr. [name]. The patient was recently hospitalized for iron deficiency anemia with hemoglobin 7.5.  He had 1 unit of packed cells with response of hemoglobin currently to 10.0.  He underwent upper endoscopy which showed no evidence of bleeding.  He has known diverticulosis.  No evidence of hematuria.  No evidence of hemolysis-elevated bilirubin.  He has not had an echocardiogram since surgery and this will be arranged in 3 months.  His blood pressure has been borderline high and he returns today for blood pressure check 140/80.  He is taking metoprolol 50 mg twice daily.  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  He has mild symptoms of sternal popping and clicking and mild pain.  I have not recommended sternal rewiring because of his previous response to anesthesia, intubation, and thoracic surgery.  Sternal rewiring would be associated with significant risk of recurrent malunion but worse and infection is  Also  a risk.  No symptoms of angina or CHF  Past Medical History:  Diagnosis Date  . Allergic rhinitis   . Anxiety   . Arthritis   . Asthma   . Bladder tumor   . Chronic fatigue   . Chronic fatigue   . Depression    06/30/17 Pt denies being depressed, reports Effexor is taken for Chronic Fatigue   . Dyspnea   . Enlarged prostate   . Fibromyalgia   . GERD (gastroesophageal reflux disease)   . Headache   . History of chronic  bronchitis   . History of migraine   . History of toxic encephalopathy   . Hypothyroidism   . OSA on CPAP    CPAP 14  . PAF (paroxysmal atrial fibrillation) ([place]) CARDIOLOGIST -- DR [name]   DX OCT 2013  . 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 Unspecified essential hypertension   . Urethral tumor    PROSTATIC    Past Surgical History:  Procedure Laterality Date  . BENTALL PROCEDURE N/A 07/02/2017   Procedure: BENTALL PROCEDURE;  Surgeon: Ivin Poot, MD;  Location: [place];  Service: Open Heart Surgery;  Laterality: N/A;  WITH CIRC ARREST  . CARDIAC SURGERY    . COLONOSCOPY    . CYSTOSCOPY W/ RETROGRADES Bilateral 06/15/2013   Procedure: CYSTOSCOPY WITH BILATERAL RETROGRADE PYELOGRAM  BLADDER BIOPSY, PROSTATIC URETHRAL BIOPSY, ;  Surgeon: Molli Hazard, MD;  Location: [facility];  Service: Urology;  Laterality: Bilateral;  . ESOPHAGOGASTRODUODENOSCOPY (EGD) WITH PROPOFOL N/A 03/11/2018   Procedure: ESOPHAGOGASTRODUODENOSCOPY (EGD) WITH PROPOFOL;  Surgeon: Clarene Essex, MD;  Location: [place];  Service: Endoscopy;  Laterality: N/A;  . LAPAROSCOPIC CHOLECYSTECTOMY  01-14-2001  . RIGHT/LEFT HEART CATH AND CORONARY ANGIOGRAPHY N/A 06/12/2017   Procedure: RIGHT/LEFT HEART CATH AND CORONARY ANGIOGRAPHY;  Surgeon: Larey Dresser, MD;  Location: [place] [facility];  Service: Cardiovascular;  Laterality: N/A;  . TEE WITHOUT CARDIOVERSION N/A 07/02/2017   Procedure: TRANSESOPHAGEAL ECHOCARDIOGRAM (TEE);  Surgeon: Prescott Gum,  Collier Salina, MD;  Location: [place];  Service: Open Heart Surgery;  Laterality: N/A;  . UMBILICAL HERNIA REPAIR  01-14-2008    Family History  Problem Relation Age of Onset  . Aortic aneurysm Mother 10       cause of death  . Other Father        motor vehicle accident  . Heart disease Unknown        family history    Social History Social History   Tobacco Use  . Smoking status: Former Smoker    Packs/day: 0.50    Years: 27.00    Pack years: 13.50     Types: Cigarettes    Last attempt to quit: 10/21/1979    Years since quitting: 38.4  . Smokeless tobacco: Never Used  Substance Use Topics  . Alcohol use: Yes    Alcohol/week: 12.6 oz    Types: 21 Cans of beer per week    Comment: 4-5 beers a day  . Drug use: No    Current Outpatient Medications  Medication Sig Dispense Refill  . acetaminophen (TYLENOL) 500 MG tablet Take 500 mg by mouth every 6 (six) hours as needed for moderate pain.    Marland Kitchen albuterol (VENTOLIN HFA) 108 (90 Base) MCG/ACT inhaler INHALE 2 PUFFS INTO LUNGS EVERY 6 HOURS AS NEEDED FOR WHEEZING OR SHORTNESS OF BREATH 18 g 12  . azelastine (ASTELIN) 137 MCG/SPRAY nasal spray Place 1-2 sprays into the nose 2 (two) times daily as needed for rhinitis or allergies.     . budesonide-formoterol (SYMBICORT) 160-4.5 MCG/ACT inhaler Inhale 2 puffs into the lungs 2 (two) times daily.    . Cholecalciferol (VITAMIN D-3) 5000 UNITS TABS Take 5,000 Units by mouth daily with breakfast.     . COLCRYS 0.6 MG tablet Take 0.6 mg by mouth daily. With breakfast    . Cyanocobalamin (VITAMIN B12 SL) Place 1,200 mcg under the tongue daily.     Marland Kitchen dextromethorphan (DELSYM) 30 MG/5ML liquid Take 30 mg by mouth at bedtime as needed for cough.     . docusate sodium (COLACE) 100 MG capsule Take 1 capsule (100 mg total) by mouth daily as needed. 30 capsule 2  . EPINEPHrine 0.3 mg/0.3 mL IJ SOAJ injection Inject 0.3 mLs (0.3 mg total) into the muscle as needed (for angioedema). 2 Device 0  . Ferrous Sulfate (CVS SLOW RELEASE IRON PO) Take 45 mg by mouth 2 (two) times daily.    . finasteride (PROSCAR) 5 MG tablet Take 5 mg by mouth at bedtime.     . Hypromellose (ALZAIR ALLERGY NASAL SPRAY NA) Place 1 spray into both nostrils daily as needed (for allergies).     Marland Kitchen levothyroxine (SYNTHROID, LEVOTHROID) 112 MCG tablet Take 112 mcg by mouth daily before breakfast.     . liothyronine (CYTOMEL) 25 MCG tablet Take 25 mcg by mouth daily.     Marland Kitchen loratadine  (CLARITIN) 10 MG tablet Take 10 mg by mouth daily.    . Menthol, Topical Analgesic, (BLUE-EMU MAXIMUM STRENGTH EX) Apply 1 application topically 4 (four) times daily as needed (for arthritis pain.).    Marland Kitchen metoprolol tartrate (LOPRESSOR) 50 MG tablet TAKE 1 TABLET(50 MG) BY MOUTH TWICE DAILY 60 tablet 8  . Multiple Vitamins-Minerals (ONE-A-DAY MENS HEALTH FORMULA PO) Take 1 tablet by mouth daily with breakfast.     . omeprazole (PRILOSEC) 40 MG capsule Take 1 capsule (40 mg total) by mouth 2 (two) times daily. 180 capsule 1  . Probiotic Product (  ALIGN) 4 MG CAPS Take 4 mg by mouth daily with breakfast.     . rivaroxaban (XARELTO) 20 MG TABS tablet Take 1 tablet (20 mg total) daily with supper by mouth. 90 tablet 3  . rizatriptan (MAXALT) 10 MG tablet Take 10 mg by mouth as needed for migraine. May repeat in 2 hours if needed    . tamsulosin (FLOMAX) 0.4 MG CAPS capsule Take 0.4 mg by mouth daily after supper.    . testosterone cypionate (DEPOTESTOTERONE CYPIONATE) 100 MG/ML injection Inject 200 mg into the muscle every Thursday. For IM use only     . tetrahydrozoline-zinc (VISINE-AC) 0.05-0.25 % ophthalmic solution Place 1 drop into both eyes as needed (dry eyes).    . venlafaxine (EFFEXOR-XR) 150 MG 24 hr capsule Take 150 mg by mouth daily with breakfast.     . DHEA 50 MG TABS Take 50 mg by mouth daily with breakfast.      No current facility-administered medications for this visit.     Allergies  Allergen Reactions  . Amiodarone Swelling  . Codeine Itching  . Tramadol     dellusion     Review of Systems  No symptoms of angina or CHF No ankle edema Mild occasional popping and chest   BP (!) 141/82 (BP Location: Right Arm, Patient Position: Sitting, Cuff Size: Large)   Pulse (!) 59   Resp 16   Ht 5\' 11"  (1.803 m)   Wt 265 lb (120.2 kg)   SpO2 98% Comment: RA  BMI 36.96 kg/m  Physical Exam      Exam    General- alert and comfortable    Neck- no JVD, no cervical adenopathy  palpable, no carotid bruit   Lungs- clear without rales, wheezes   Cor- regular rate and rhythm, no murmur , gallop   Abdomen- soft, non-tender   Extremities - warm, non-tender, minimal edema   Neuro- oriented, appropriate, no focal weakness   Diagnostic Tests: None  Impression: Progressing well now 9 months postop aortic root replacement with biologic valve and replacement of ascending aortic aneurysm.  Postop CTA of thoracic aorta is normal after aortic repair. He will return in 3 months with echocardiogram to assess his aortic valve function 1 year postop.  The patient understands the importance of antibiotic prophylaxis prior to dental procedures to prevent endocarditis  Plan: Echocardiogram and office visit in 3 months to review progress check blood pressure and assess prosthetic aortic valve   Len Childs, MD [facility] [phone]

## 2018-03-25 DIAGNOSIS — K5792 Diverticulitis of intestine, part unspecified, without perforation or abscess without bleeding: Secondary | ICD-10-CM | POA: Diagnosis not present

## 2018-03-25 DIAGNOSIS — D509 Iron deficiency anemia, unspecified: Secondary | ICD-10-CM | POA: Diagnosis not present

## 2018-03-31 DIAGNOSIS — D509 Iron deficiency anemia, unspecified: Secondary | ICD-10-CM | POA: Diagnosis not present

## 2018-04-07 ENCOUNTER — Encounter: Payer: Medicare Other | Admitting: Cardiothoracic Surgery

## 2018-04-14 ENCOUNTER — Encounter: Payer: Medicare Other | Admitting: Cardiothoracic Surgery

## 2018-05-05 ENCOUNTER — Ambulatory Visit: Payer: Medicare Other | Admitting: Internal Medicine

## 2018-05-20 DIAGNOSIS — D509 Iron deficiency anemia, unspecified: Secondary | ICD-10-CM | POA: Diagnosis not present

## 2018-05-25 DIAGNOSIS — N401 Enlarged prostate with lower urinary tract symptoms: Secondary | ICD-10-CM | POA: Diagnosis not present

## 2018-05-26 DIAGNOSIS — D509 Iron deficiency anemia, unspecified: Secondary | ICD-10-CM | POA: Diagnosis not present

## 2018-05-26 DIAGNOSIS — K5792 Diverticulitis of intestine, part unspecified, without perforation or abscess without bleeding: Secondary | ICD-10-CM | POA: Diagnosis not present

## 2018-05-28 ENCOUNTER — Other Ambulatory Visit: Payer: Self-pay | Admitting: *Deleted

## 2018-05-28 DIAGNOSIS — Z8679 Personal history of other diseases of the circulatory system: Secondary | ICD-10-CM

## 2018-05-28 DIAGNOSIS — Z9889 Other specified postprocedural states: Secondary | ICD-10-CM

## 2018-05-28 DIAGNOSIS — Z954 Presence of other heart-valve replacement: Principal | ICD-10-CM

## 2018-05-28 DIAGNOSIS — Z952 Presence of prosthetic heart valve: Secondary | ICD-10-CM

## 2018-06-01 DIAGNOSIS — R351 Nocturia: Secondary | ICD-10-CM | POA: Diagnosis not present

## 2018-06-01 DIAGNOSIS — N401 Enlarged prostate with lower urinary tract symptoms: Secondary | ICD-10-CM | POA: Diagnosis not present

## 2018-06-07 ENCOUNTER — Other Ambulatory Visit: Payer: Self-pay | Admitting: Internal Medicine

## 2018-06-10 ENCOUNTER — Telehealth: Payer: Self-pay | Admitting: Cardiovascular Disease

## 2018-06-10 DIAGNOSIS — J209 Acute bronchitis, unspecified: Secondary | ICD-10-CM | POA: Diagnosis not present

## 2018-06-10 DIAGNOSIS — J45901 Unspecified asthma with (acute) exacerbation: Secondary | ICD-10-CM | POA: Diagnosis not present

## 2018-06-10 NOTE — Telephone Encounter (Signed)
Called patient's wife back about amoxicillin. Informed her that patient does not need for just a cleaning. Patient's verbalized understanding.

## 2018-06-10 NOTE — Telephone Encounter (Signed)
New Message    Pt c/o medication issue:  1. Name of Medication: amoxicillan  2. How are you currently taking this medication (dosage and times per day)?   3. Are you having a reaction (difficulty breathing--STAT)?   4. What is your medication issue? Patients wife is calling on his behalf. She states that he was given amoxicillin from Dr. [name] just in case he went to the dentist to take. She wants to know can he take it if he just gets a normal cleaning.

## 2018-06-17 ENCOUNTER — Other Ambulatory Visit: Payer: Self-pay | Admitting: Internal Medicine

## 2018-06-17 MED ORDER — ALBUTEROL SULFATE HFA 108 (90 BASE) MCG/ACT IN AERS: INHALATION_SPRAY | RESPIRATORY_TRACT | 12 refills | Status: DC

## 2018-06-25 DIAGNOSIS — Z23 Encounter for immunization: Secondary | ICD-10-CM | POA: Diagnosis not present

## 2018-07-08 DIAGNOSIS — G4733 Obstructive sleep apnea (adult) (pediatric): Secondary | ICD-10-CM | POA: Diagnosis not present

## 2018-07-12 ENCOUNTER — Other Ambulatory Visit (HOSPITAL_COMMUNITY): Payer: Medicare Other

## 2018-07-14 ENCOUNTER — Encounter: Payer: Medicare Other | Admitting: Cardiothoracic Surgery

## 2018-07-26 ENCOUNTER — Ambulatory Visit (HOSPITAL_COMMUNITY): Payer: Medicare Other | Attending: Cardiovascular Disease

## 2018-07-26 ENCOUNTER — Other Ambulatory Visit: Payer: Self-pay

## 2018-07-26 DIAGNOSIS — I10 Essential (primary) hypertension: Secondary | ICD-10-CM | POA: Diagnosis not present

## 2018-07-26 DIAGNOSIS — Z8679 Personal history of other diseases of the circulatory system: Secondary | ICD-10-CM

## 2018-07-26 DIAGNOSIS — Z6837 Body mass index (BMI) 37.0-37.9, adult: Secondary | ICD-10-CM | POA: Diagnosis not present

## 2018-07-26 DIAGNOSIS — E669 Obesity, unspecified: Secondary | ICD-10-CM | POA: Insufficient documentation

## 2018-07-26 DIAGNOSIS — Z87891 Personal history of nicotine dependence: Secondary | ICD-10-CM | POA: Insufficient documentation

## 2018-07-26 DIAGNOSIS — Z952 Presence of prosthetic heart valve: Secondary | ICD-10-CM | POA: Diagnosis not present

## 2018-07-26 DIAGNOSIS — Z954 Presence of other heart-valve replacement: Secondary | ICD-10-CM

## 2018-07-26 DIAGNOSIS — Z9889 Other specified postprocedural states: Secondary | ICD-10-CM | POA: Diagnosis not present

## 2018-07-27 ENCOUNTER — Other Ambulatory Visit: Payer: Self-pay | Admitting: Internal Medicine

## 2018-07-27 ENCOUNTER — Other Ambulatory Visit: Payer: Self-pay | Admitting: Physician Assistant

## 2018-07-27 DIAGNOSIS — H52223 Regular astigmatism, bilateral: Secondary | ICD-10-CM | POA: Diagnosis not present

## 2018-07-28 ENCOUNTER — Encounter: Payer: Medicare Other | Admitting: Cardiothoracic Surgery

## 2018-07-29 ENCOUNTER — Other Ambulatory Visit: Payer: Self-pay

## 2018-07-29 ENCOUNTER — Encounter: Payer: Self-pay | Admitting: Cardiothoracic Surgery

## 2018-07-29 ENCOUNTER — Ambulatory Visit: Payer: Medicare Other | Admitting: Cardiothoracic Surgery

## 2018-07-29 VITALS — BP 170/90 | HR 73 | Resp 18 | Ht 71.0 in | Wt 270.8 lb

## 2018-07-29 DIAGNOSIS — Z9889 Other specified postprocedural states: Secondary | ICD-10-CM

## 2018-07-29 DIAGNOSIS — Z8679 Personal history of other diseases of the circulatory system: Secondary | ICD-10-CM | POA: Diagnosis not present

## 2018-07-29 MED ORDER — LOSARTAN POTASSIUM 25 MG PO TABS: 25.0000 mg | ORAL_TABLET | Freq: Every day | ORAL | 5 refills | Status: DC

## 2018-07-29 NOTE — Progress Notes (Signed)
PCP is Shirline Frees, MD Referring Provider is Josue Hector, MD  Chief Complaint  Patient presents with  . Follow-up    f/u with ECHO 07/26/18, s/p Bentall 07/02/17    HPI: The patient returns for one-year follow-up after biologic-Bentall procedure with circulatory arrest and AVR with a 25 mm pericardial tissue valve.  He had a recent echocardiogram showing normal biventricular function, normal aortic valve function with minimal transvalvular gradient.  No pericardial effusion.  Patient remains in sinus rhythm.  CTA performed 6 months postop showed the ascending aorta graft and anastomosis to be intact without false aneurysm or dissection.  The patient's CT scan did show slight sternal separation in the area of the manubrium but the wires are intact and the integrity of the thorax is satisfactory. Patient is resumed active lifestyle traveling to the beach regularly and using his boat, one of his life's pleasures.  Past Medical History:  Diagnosis Date  . Allergic rhinitis   . Anxiety   . Arthritis   . Asthma   . Bladder tumor   . Chronic fatigue   . Chronic fatigue   . Depression    06/30/17 Pt denies being depressed, reports Effexor is taken for Chronic Fatigue   . Dyspnea   . Enlarged prostate   . Fibromyalgia   . GERD (gastroesophageal reflux disease)   . Headache   . History of chronic bronchitis   . History of migraine   . History of toxic encephalopathy   . Hypothyroidism   . OSA on CPAP    CPAP 14  . PAF (paroxysmal atrial fibrillation) ([place]) CARDIOLOGIST -- DR [name]   DX OCT 2013  . 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 Unspecified essential hypertension   . Urethral tumor    PROSTATIC    Past Surgical History:  Procedure Laterality Date  . BENTALL PROCEDURE N/A 07/02/2017   Procedure: BENTALL PROCEDURE;  Surgeon: Ivin Poot, MD;  Location: [place];  Service: Open Heart Surgery;  Laterality: N/A;  WITH CIRC ARREST  . CARDIAC SURGERY    . COLONOSCOPY    . CYSTOSCOPY  W/ RETROGRADES Bilateral 06/15/2013   Procedure: CYSTOSCOPY WITH BILATERAL RETROGRADE PYELOGRAM  BLADDER BIOPSY, PROSTATIC URETHRAL BIOPSY, ;  Surgeon: Molli Hazard, MD;  Location: [facility];  Service: Urology;  Laterality: Bilateral;  . ESOPHAGOGASTRODUODENOSCOPY (EGD) WITH PROPOFOL N/A 03/11/2018   Procedure: ESOPHAGOGASTRODUODENOSCOPY (EGD) WITH PROPOFOL;  Surgeon: Clarene Essex, MD;  Location: [place];  Service: Endoscopy;  Laterality: N/A;  . LAPAROSCOPIC CHOLECYSTECTOMY  01-14-2001  . RIGHT/LEFT HEART CATH AND CORONARY ANGIOGRAPHY N/A 06/12/2017   Procedure: RIGHT/LEFT HEART CATH AND CORONARY ANGIOGRAPHY;  Surgeon: Larey Dresser, MD;  Location: [place] [facility];  Service: Cardiovascular;  Laterality: N/A;  . TEE WITHOUT CARDIOVERSION N/A 07/02/2017   Procedure: TRANSESOPHAGEAL ECHOCARDIOGRAM (TEE);  Surgeon: Prescott Gum, Collier Salina, MD;  Location: [place];  Service: Open Heart Surgery;  Laterality: N/A;  . UMBILICAL HERNIA REPAIR  01-14-2008    Family History  Problem Relation Age of Onset  . Aortic aneurysm Mother 19       cause of death  . Other Father        motor vehicle accident  . Heart disease Unknown        family history    Social History Social History   Tobacco Use  . Smoking status: Former Smoker    Packs/day: 0.50    Years: 27.00    Pack years: 13.50  Types: Cigarettes    Last attempt to quit: 10/21/1979    Years since quitting: 38.7  . Smokeless tobacco: Never Used  Substance Use Topics  . Alcohol use: Yes    Alcohol/week: 21.0 standard drinks    Types: 21 Cans of beer per week    Comment: 4-5 beers a day  . Drug use: No    Current Outpatient Medications  Medication Sig Dispense Refill  . acetaminophen (TYLENOL) 500 MG tablet Take 500 mg by mouth every 6 (six) hours as needed for moderate pain.    Marland Kitchen albuterol (VENTOLIN HFA) 108 (90 Base) MCG/ACT inhaler INHALE 2 PUFFS INTO LUNGS EVERY 6 HOURS AS NEEDED FOR WHEEZING OR SHORTNESS OF  BREATH 18 g 12  . azelastine (ASTELIN) 137 MCG/SPRAY nasal spray Place 1-2 sprays into the nose 2 (two) times daily as needed for rhinitis or allergies.     . budesonide-formoterol (SYMBICORT) 160-4.5 MCG/ACT inhaler INHALE 2 PUFFS BY MOUTH TWICE DAILY 10.2 g 0  . Cholecalciferol (VITAMIN D-3) 5000 UNITS TABS Take 5,000 Units by mouth daily with breakfast.     . COLCRYS 0.6 MG tablet Take 0.6 mg by mouth daily. With breakfast    . Cyanocobalamin (VITAMIN B12 SL) Place 1,200 mcg under the tongue daily.     Marland Kitchen dextromethorphan (DELSYM) 30 MG/5ML liquid Take 30 mg by mouth at bedtime as needed for cough.     Marland Kitchen DHEA 50 MG TABS Take 50 mg by mouth daily with breakfast.     . docusate sodium (COLACE) 100 MG capsule Take 1 capsule (100 mg total) by mouth daily as needed. 30 capsule 2  . EPINEPHrine 0.3 mg/0.3 mL IJ SOAJ injection Inject 0.3 mLs (0.3 mg total) into the muscle as needed (for angioedema). 2 Device 0  . Ferrous Sulfate (CVS SLOW RELEASE IRON PO) Take 45 mg by mouth 2 (two) times daily.    . finasteride (PROSCAR) 5 MG tablet Take 5 mg by mouth at bedtime.     . Hypromellose (ALZAIR ALLERGY NASAL SPRAY NA) Place 1 spray into both nostrils daily as needed (for allergies).     Marland Kitchen levothyroxine (SYNTHROID, LEVOTHROID) 112 MCG tablet Take 112 mcg by mouth daily before breakfast.     . liothyronine (CYTOMEL) 25 MCG tablet Take 25 mcg by mouth daily.     Marland Kitchen loratadine (CLARITIN) 10 MG tablet Take 10 mg by mouth daily.    . Menthol, Topical Analgesic, (BLUE-EMU MAXIMUM STRENGTH EX) Apply 1 application topically 4 (four) times daily as needed (for arthritis pain.).    Marland Kitchen metoprolol tartrate (LOPRESSOR) 50 MG tablet TAKE 1 TABLET(50 MG) BY MOUTH TWICE DAILY 60 tablet 7  . Multiple Vitamins-Minerals (ONE-A-DAY MENS HEALTH FORMULA PO) Take 1 tablet by mouth daily with breakfast.     . omeprazole (PRILOSEC) 40 MG capsule Take 1 capsule (40 mg total) by mouth 2 (two) times daily. 180 capsule 1  . rivaroxaban  (XARELTO) 20 MG TABS tablet Take 1 tablet (20 mg total) daily with supper by mouth. 90 tablet 3  . rizatriptan (MAXALT) 10 MG tablet Take 10 mg by mouth as needed for migraine. May repeat in 2 hours if needed    . tamsulosin (FLOMAX) 0.4 MG CAPS capsule Take 0.4 mg by mouth daily after supper.    . testosterone cypionate (DEPOTESTOTERONE CYPIONATE) 100 MG/ML injection Inject 200 mg into the muscle every Thursday. For IM use only     . tetrahydrozoline-zinc (VISINE-AC) 0.05-0.25 % ophthalmic solution Place 1  drop into both eyes as needed (dry eyes).    . venlafaxine (EFFEXOR-XR) 150 MG 24 hr capsule Take 150 mg by mouth daily with breakfast.     . losartan (COZAAR) 25 MG tablet Take 1 tablet (25 mg total) by mouth daily. 30 tablet 5   No current facility-administered medications for this visit.     Allergies  Allergen Reactions  . Amiodarone Swelling  . Codeine Itching  . Tramadol     dellusion     Review of Systems  Patient and wife concerned about hypertension Hypertension is been documented on his past few medical visits Patient was hospitalized in May of this year for diverticulitis I recommended that he start on losartan 25 mg daily in addition to his metoprolol  BP (!) 170/90 (BP Location: Right Arm, Patient Position: Sitting, Cuff Size: Normal)   Pulse 73   Resp 18   Ht 5\' 11"  (1.803 m)   Wt 270 lb 12.8 oz (122.8 kg)   SpO2 95% Comment: RA  BMI 37.77 kg/m  Physical Exam      Exam    General- alert and comfortable    Neck- no JVD, no cervical adenopathy palpable, no carotid bruit   Lungs- clear without rales, wheezes.  Sternal incision well-healed, stable chest wall   Cor- regular rate and rhythm, no murmur , gallop   Abdomen- soft, non-tender   Extremities - warm, non-tender, minimal edema   Neuro- oriented, appropriate, no focal weakness   Diagnostic Tests: Echocardiogram images personally reviewed showing normal biventricular function and normal prosthetic  aortic valve function  Impression: Patient doing well 1 year postop biologic Bentall procedure Blood pressure is too high.  I recommended he add losartan 25 mg daily in addition to his metoprolol 50 mg twice daily to protect against further aortic dilatation-aneurysm formation Plan: Return in 3 months for blood pressure check.  Patient will be assessed by his primary care physician Dr. Kenton Kingfisher to receive the okay to start losartan 25 mg daily Patient should continue to avoid heavy lifting.  Limit is 35 pounds.  Len Childs, MD [facility] [phone]

## 2018-08-11 DIAGNOSIS — F3341 Major depressive disorder, recurrent, in partial remission: Secondary | ICD-10-CM | POA: Diagnosis not present

## 2018-08-11 DIAGNOSIS — I10 Essential (primary) hypertension: Secondary | ICD-10-CM | POA: Diagnosis not present

## 2018-08-11 DIAGNOSIS — D509 Iron deficiency anemia, unspecified: Secondary | ICD-10-CM | POA: Diagnosis not present

## 2018-08-11 DIAGNOSIS — R05 Cough: Secondary | ICD-10-CM | POA: Diagnosis not present

## 2018-08-20 DIAGNOSIS — I10 Essential (primary) hypertension: Secondary | ICD-10-CM | POA: Diagnosis not present

## 2018-08-24 DIAGNOSIS — J069 Acute upper respiratory infection, unspecified: Secondary | ICD-10-CM | POA: Diagnosis not present

## 2018-09-02 DIAGNOSIS — I10 Essential (primary) hypertension: Secondary | ICD-10-CM | POA: Diagnosis not present

## 2018-09-02 DIAGNOSIS — Z7982 Long term (current) use of aspirin: Secondary | ICD-10-CM | POA: Diagnosis not present

## 2018-09-02 DIAGNOSIS — E039 Hypothyroidism, unspecified: Secondary | ICD-10-CM | POA: Diagnosis not present

## 2018-09-02 DIAGNOSIS — R05 Cough: Secondary | ICD-10-CM | POA: Diagnosis not present

## 2018-09-02 DIAGNOSIS — R0602 Shortness of breath: Secondary | ICD-10-CM | POA: Diagnosis not present

## 2018-09-02 DIAGNOSIS — R0600 Dyspnea, unspecified: Secondary | ICD-10-CM | POA: Diagnosis not present

## 2018-09-02 DIAGNOSIS — Z87891 Personal history of nicotine dependence: Secondary | ICD-10-CM | POA: Diagnosis not present

## 2018-09-02 DIAGNOSIS — G8191 Hemiplegia, unspecified affecting right dominant side: Secondary | ICD-10-CM | POA: Diagnosis not present

## 2018-09-02 DIAGNOSIS — R918 Other nonspecific abnormal finding of lung field: Secondary | ICD-10-CM | POA: Diagnosis not present

## 2018-09-06 NOTE — Telephone Encounter (Signed)
-----   Message -----  From: [name]  Sent: 09/06/2018 10:18 AM EST  To: Baird Lyons, MD Subject: Non-Urgent Medical Question  Sheena had to go to [facility] ER in [facility] 11/14 for a recurring cough issue he has going on for over a couple of months that has been treated w prednisone shots and antibiotics. (he saw Dr. Kenton Kingfisher @ [facility] on 10/23 and [place] walk-in 11/4 at [facility] ER diagnosis was acute dyspneaand acute cough. See visit summary attached. Also picking up a copy of the xray and ct on disk which I can send soon. They treated his issue with an Albuterol breathing treatment and a new RX for Xopenex HFA inhaler. He has done much better since starting this treatment however I would like you to be aware of the issue and the tests he has had in case we need to call you for follow up when we get back to [place] from the [place] after Thanksgiving.   Thank you Dr. Rylee, [place] [phone]  Received the following message from patient's wife. Will forward to Dr. Annamaria Boots so he is aware.

## 2018-09-07 IMAGING — DX DG CHEST 1V PORT
1 series · 1 of 1 positions shown · non-contrast
Comparison: Portable exam 2192 hours compared to 07/02/2017

CLINICAL DATA: Post aortic arch reconstruction, sore chest

EXAM:
PORTABLE CHEST 1 VIEW

[chest]
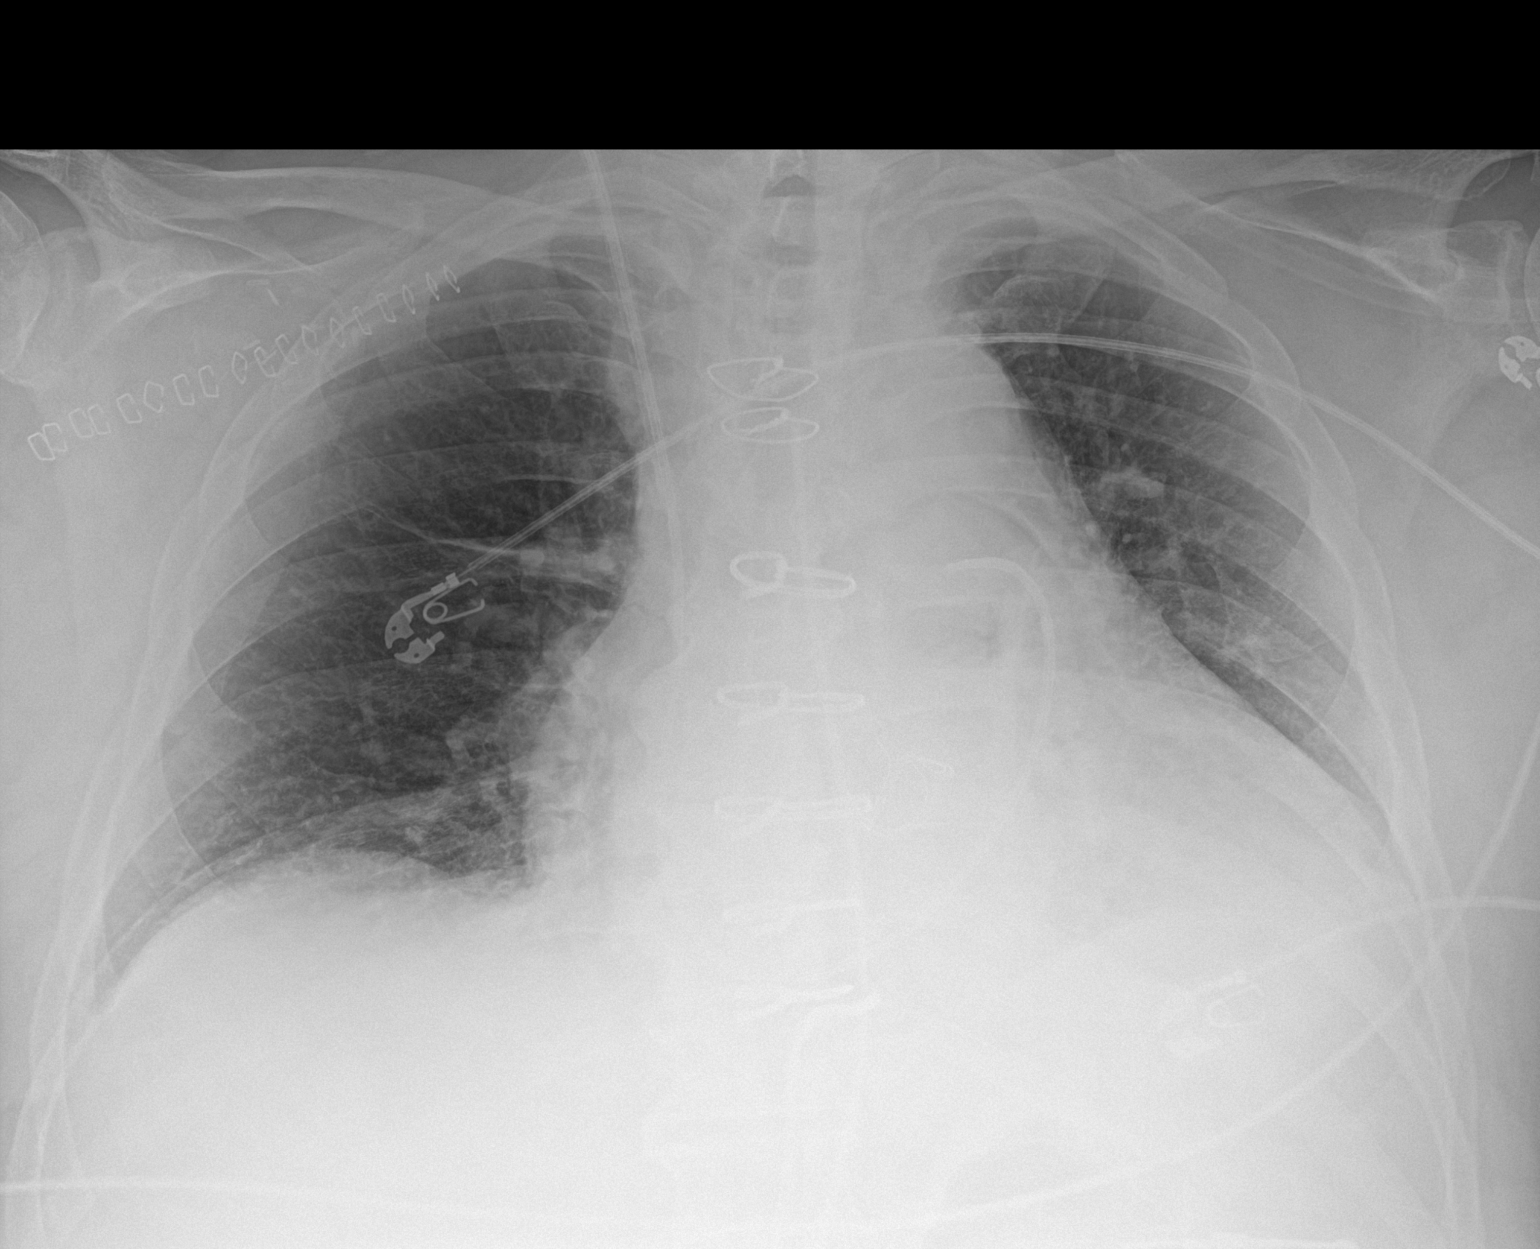

[1 of 1 positions shown; findings below may reference images not displayed]

FINDINGS: Interval removal of endotracheal nasogastric tubes.

RIGHT jugular Swan-Ganz catheter with tip projecting over proximal
RIGHT pulmonary artery partially withdrawn since prior exam.

Mediastinal drain persists.

Enlargement of cardiac silhouette post median sternotomy and AVR.

Mediastinal contours and pulmonary vascularity normal for
postoperative patient.

Bibasilar atelectasis without infiltrate, pleural effusion or
pneumothorax.
IMPRESSION: Mild bibasilar atelectasis.

Enlargement of cardiac silhouette post AVR.

## 2018-09-08 IMAGING — CR DG CHEST 1V PORT
1 series · 1 of 1 positions shown · non-contrast
Comparison: Chest radiograph 07/04/2017

CLINICAL DATA: Chest tube placement

EXAM:
PORTABLE CHEST 1 VIEW

[AP]
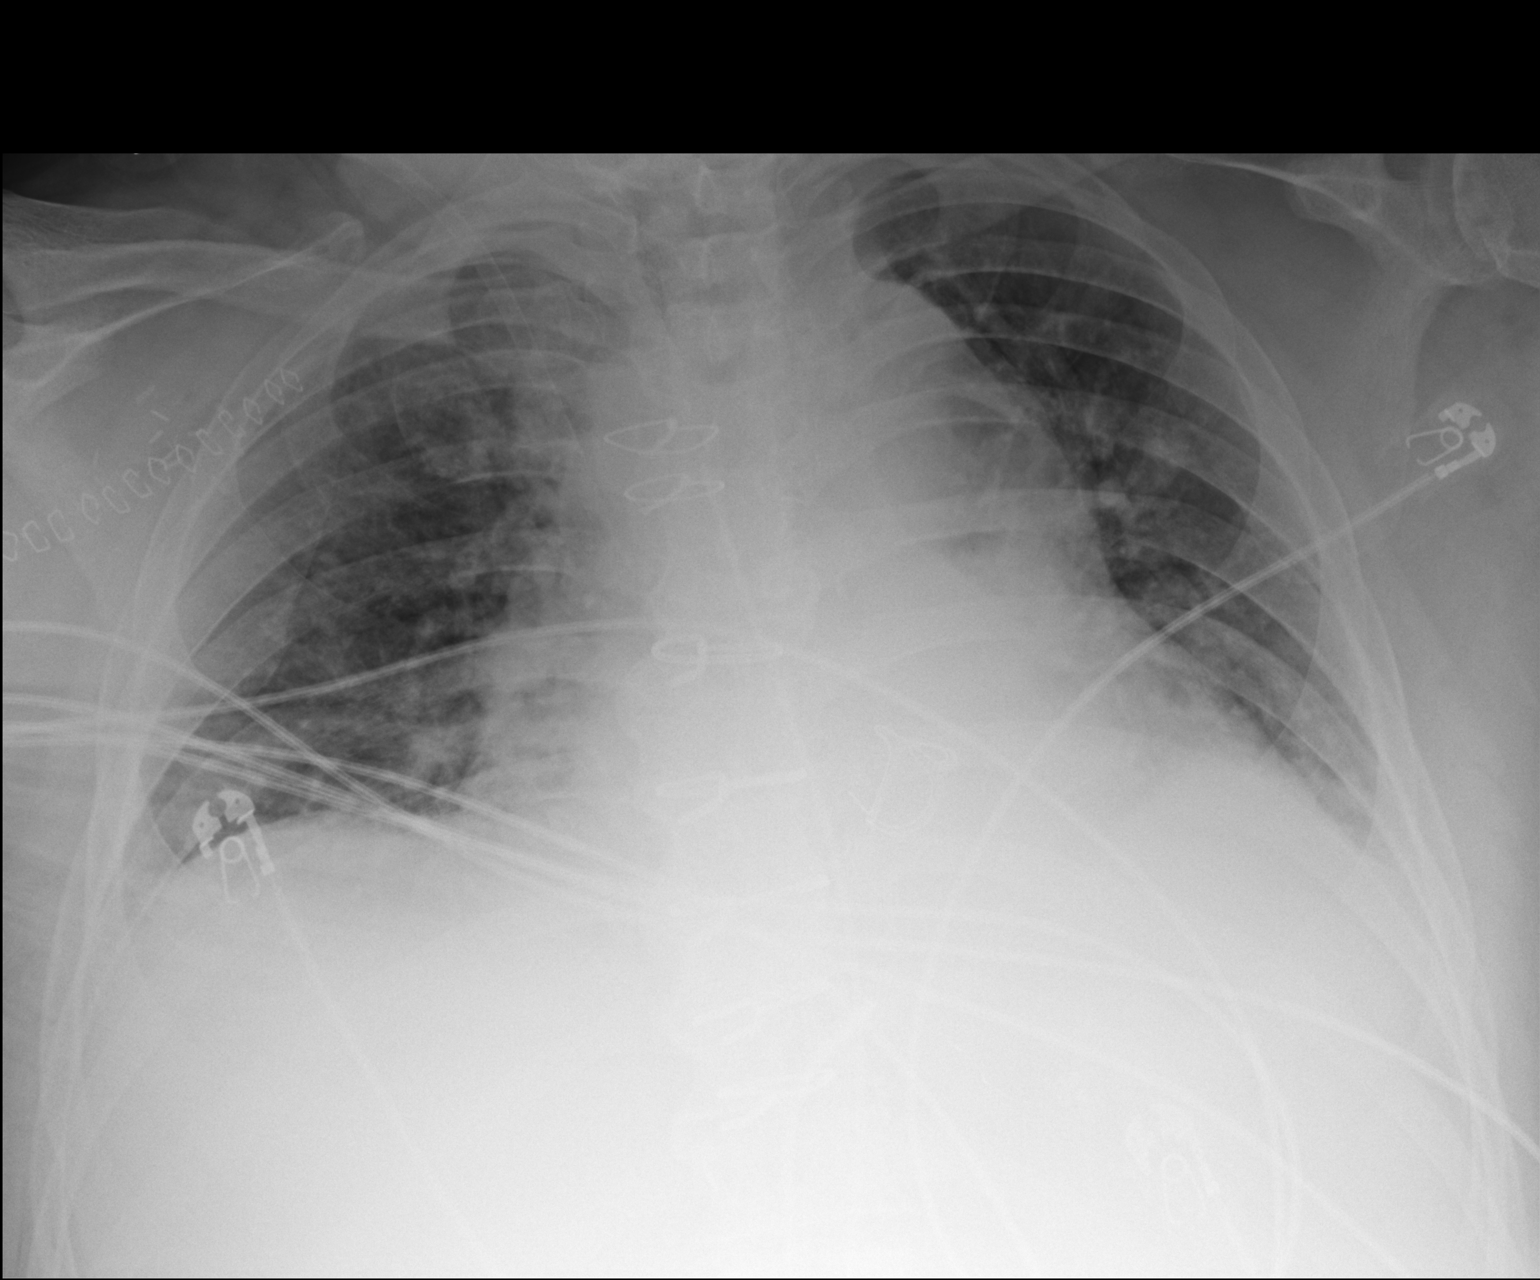

[1 of 1 positions shown; findings below may reference images not displayed]

FINDINGS: Unchanged position of right internal jugular vein catheter/sheath.
Unchanged cardiomegaly with pulmonary vascular congestion. No chest
tube is visualized.
IMPRESSION: 1. No chest tube is visualized.
2. Unchanged hypoinflation, cardiomegaly and pulmonary vascular
congestion.

## 2018-09-10 IMAGING — DX DG CHEST 1V PORT
1 series · 1 of 1 positions shown · non-contrast
Comparison: 07/05/2017

CLINICAL DATA: Status post aortic valve replacement. Shortness of
breath

EXAM:
PORTABLE CHEST 1 VIEW

[chest ap]
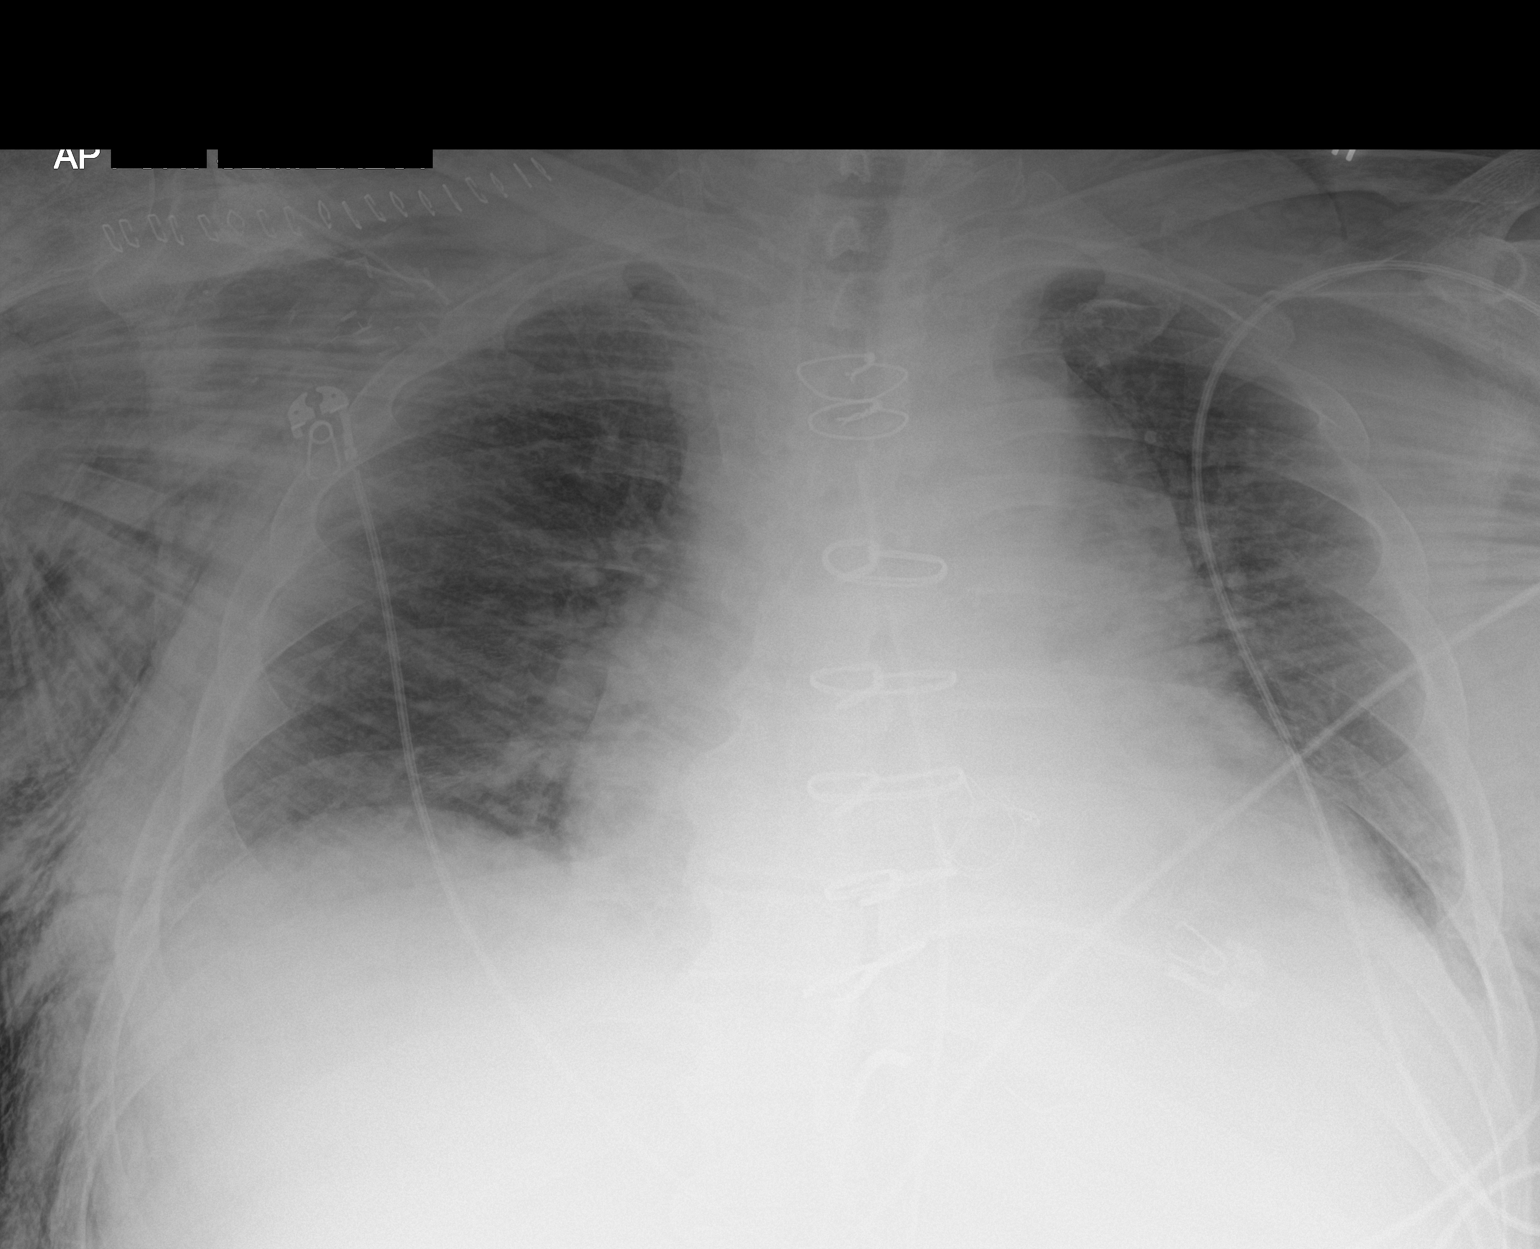

[1 of 1 positions shown; findings below may reference images not displayed]

FINDINGS: Worsening diffuse subcutaneous emphysema throughout the chest wall
bilaterally extending into the visualized lower neck. No visible
significant pneumothorax. Very low lung volumes with bibasilar
atelectasis. Cardiomegaly with vascular congestion.
IMPRESSION: Worsening diffuse subcutaneous emphysema concerning for air leak. No
visible pneumothorax.

Cardiomegaly with vascular congestion. Low volumes with bibasilar
atelectasis.

## 2018-09-14 IMAGING — DX DG CHEST 2V
2 series · 2 of 2 positions shown · non-contrast
Comparison: July 08, 2017

CLINICAL DATA: Status post aortic valve replacement. Follow-up
pneumothorax.

EXAM:
CHEST  2 VIEW

[chest lat]
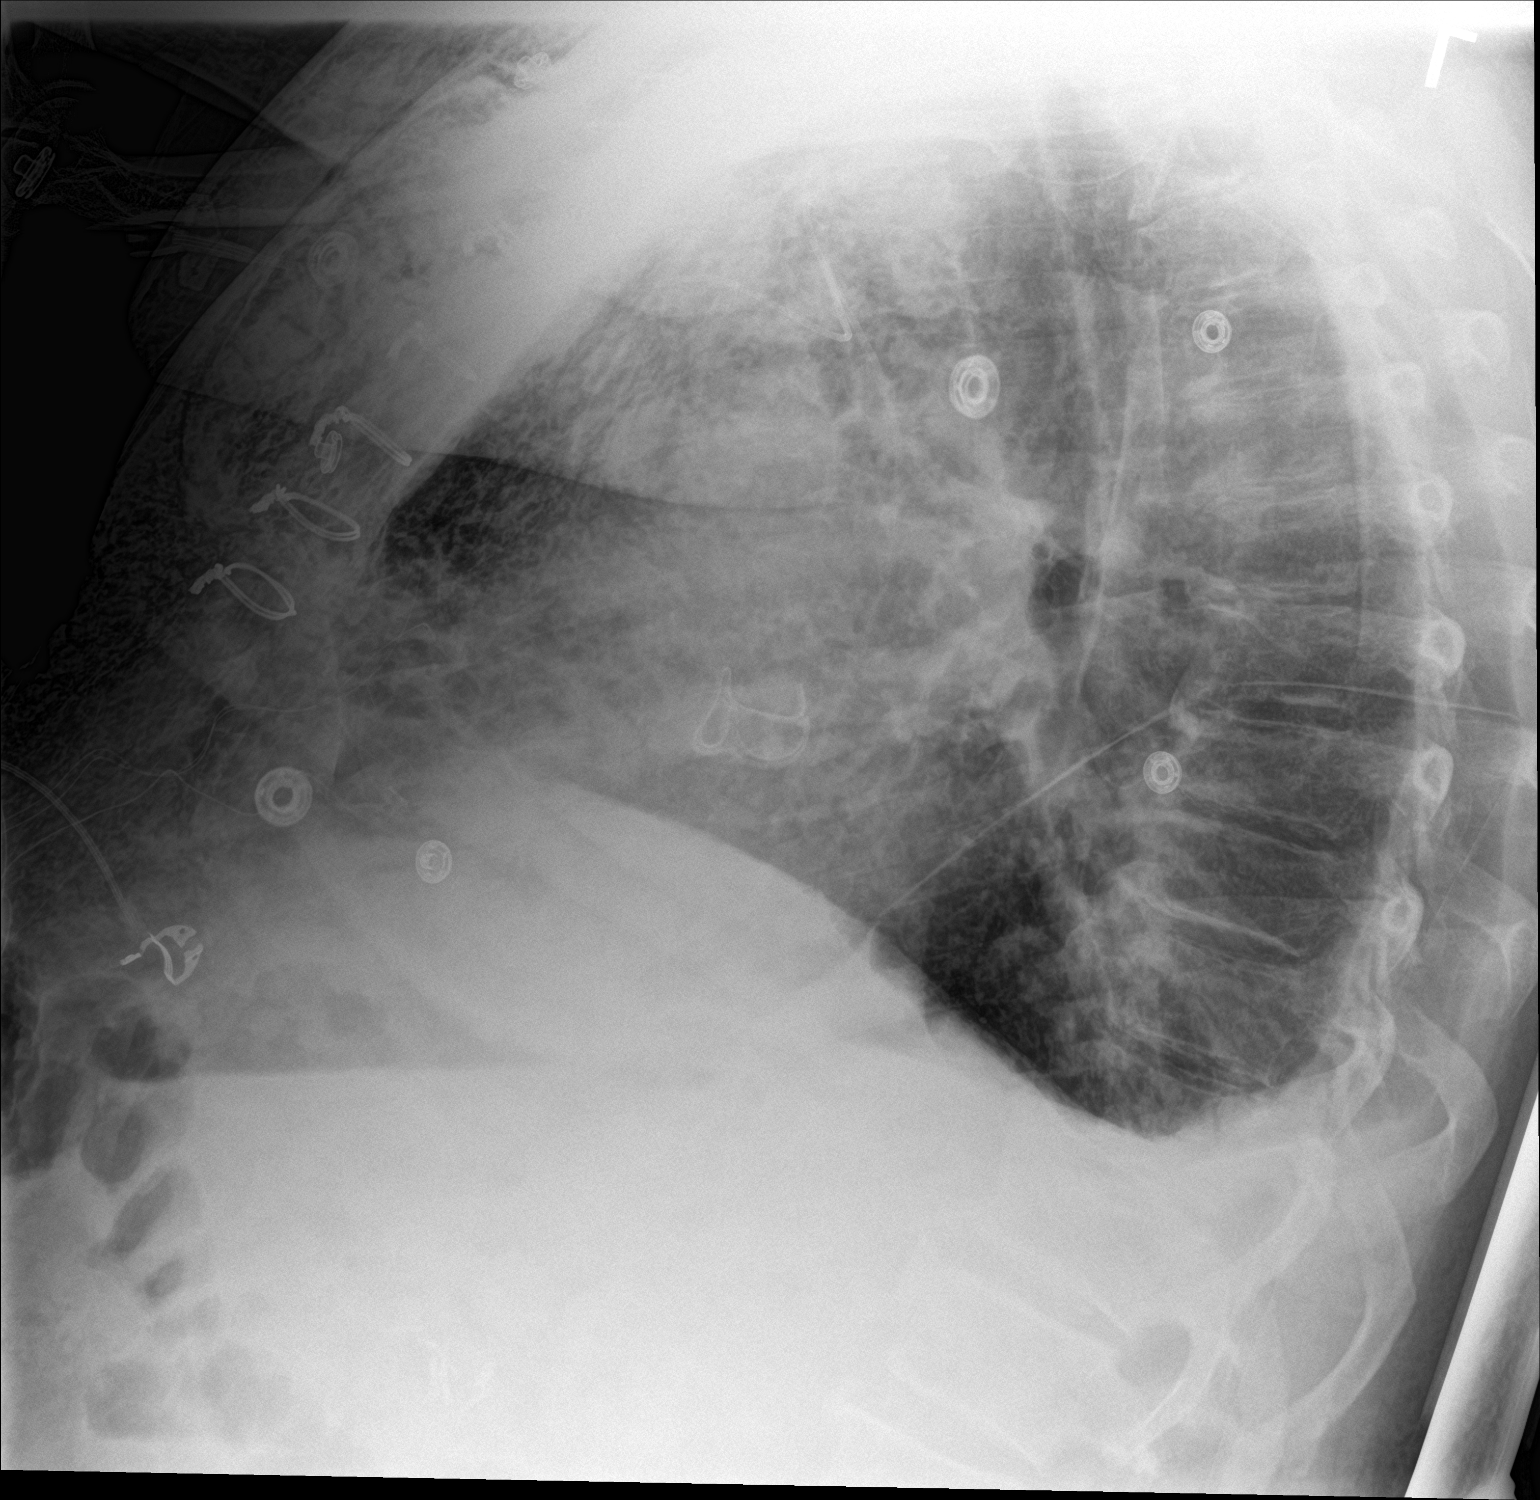

[chest ap]
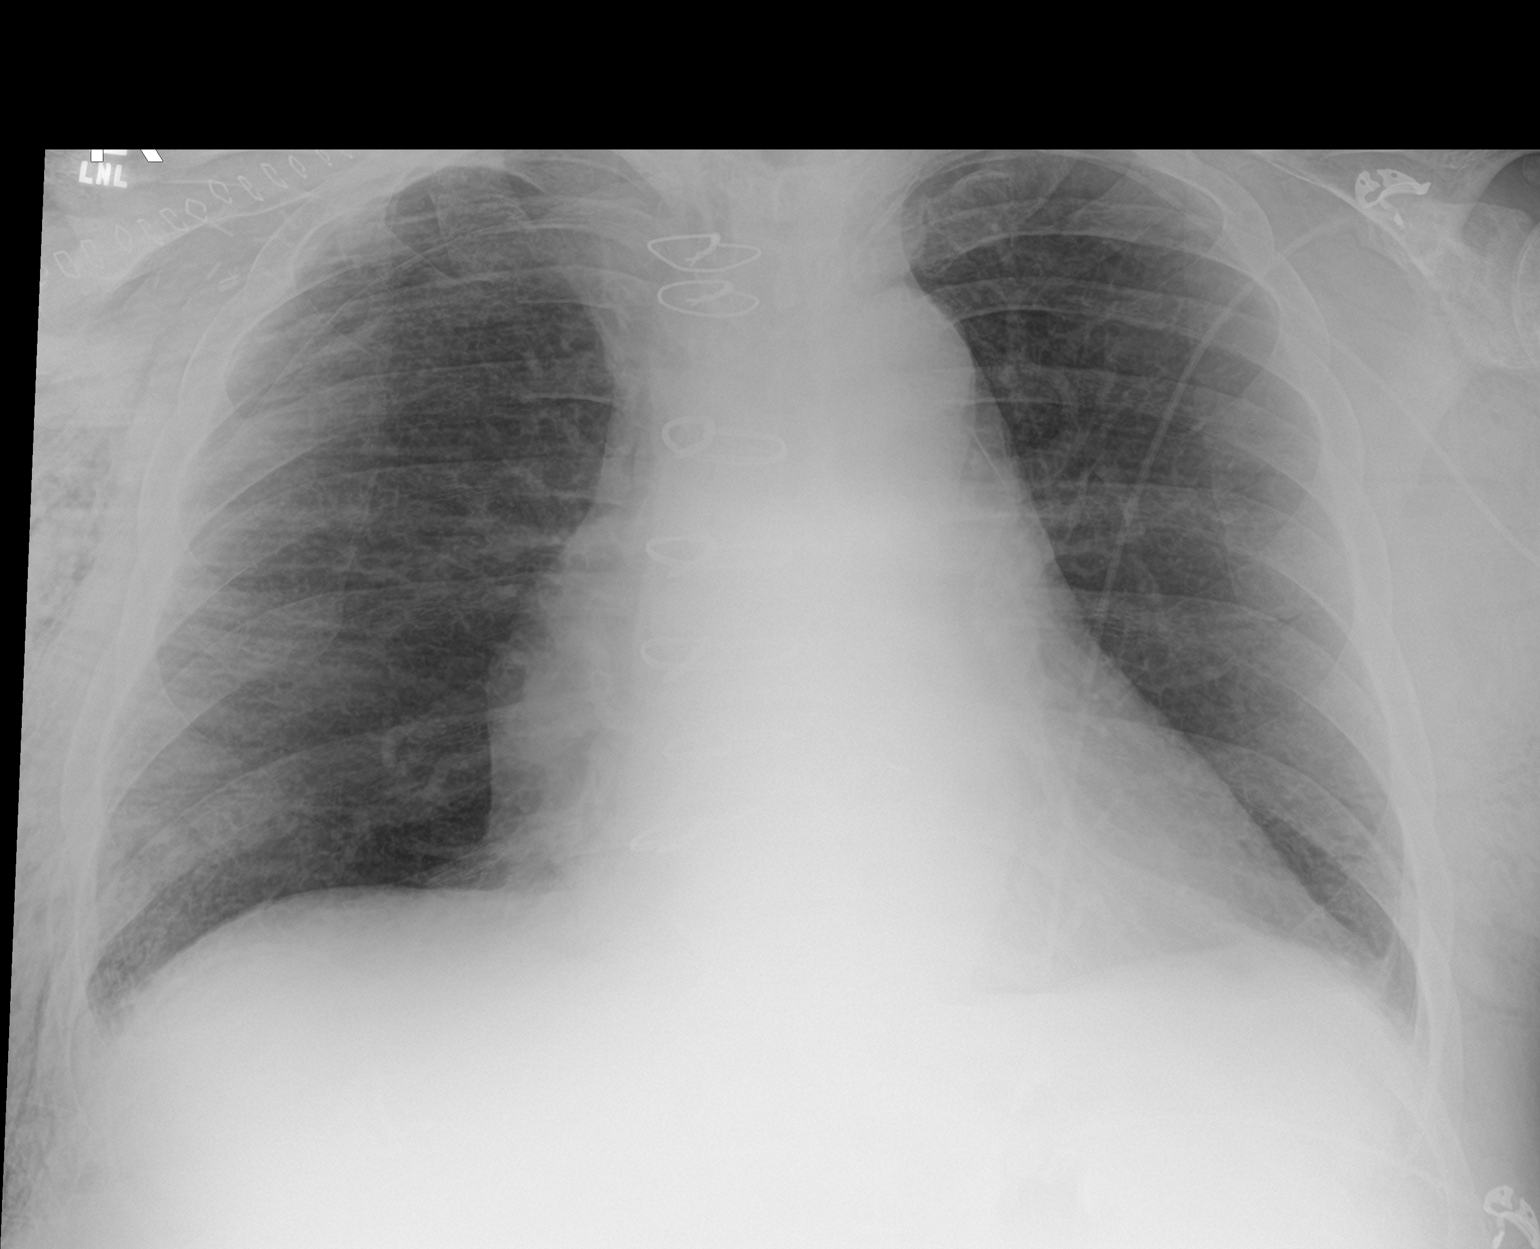

[2 of 2 positions shown; findings below may reference images not displayed]

FINDINGS: The right-sided pneumothorax persists measuring 2.4 cm at the apex
today versus 1.1 cm previously. The small pneumothorax accounting
for approximately 10-15% of the thorax today has increased in the
interval. A left PICC line terminates in the central SVC. No
left-sided pneumothorax. Subcutaneous air in the right chest wall
has decreased but persists. Skin staples remain on the right.
Cardiomegaly. The hila and mediastinum are unchanged.
IMPRESSION: 1. Small right pneumothorax accounting for 10-15% of the thorax.
This is larger in the interval accounting for 5-10% previously.
Recommend clinical correlation and attention on follow-up.
2. Stable left PICC line.
3. Persistent subcutaneous air over the right chest wall, improved
in the interval.
These results will be called to the ordering clinician or
representative by the Radiologist Assistant, and communication
documented in the PACS or zVision Dashboard.

## 2018-09-21 NOTE — Progress Notes (Signed)
@Patient  ID: Grant [name], male    DOB: Feb 08, 1951, 67 y.o.   MRN: [id number]  Chief Complaint  Patient presents with  . Hospitalization Follow-up    Seen at [facility] in November/2019, recurrent cough     Referring provider: Shirline Frees, MD  HPI:  67 year old male former smoker followed in our office for asthma, chronic bronchitis, allergic rhinitis, obstructive sleep apnea  PMH: Angioedema, PAF/anticoagulation, aortic valve replacement, toxic encephalopathy, light sensitivity, chronic fatigue, fibromyalgia, s/p Bentall procedure, HTN Smoker/ Smoking History: Former smoker.  13.5 pack years Maintenance: Symbicort 160 Pt of: Dr. Annamaria Boots  67 year old male former smoker presenting to office today for acute visit and hospital follow-up.  Patient reported that he was seen in [facility] in [place] on 09/02/2018.  Since then patient's symptoms have waned.  Patient was treated as an acute bronchitis episode with nebulized medications but no antibiotics or steroids.  Patient presents today with his wife.  Patient's wife and patient report the patient had 2 episodes of coughing (09/20/2018 and 09/21/2018) that resulted in patient gasping for air, wife felt the patient may have needed to go to the emergency room, patient refused, patient reports that after he calm down symptoms improved.  Patient reports that typically when he has these fits Tessalon Perles help they need a refill of these today.  Patient also reports that he typically responds well to a prednisone shot as well as antibiotics.  Patient timeline listed below: 06/10/2018-[facility]-cough and congestion 06/25/2018-flu shot and Prevnar 07/22/2018- returned from beach with bad cough 07/26/2018- echo with [name] 08/11/2018-[facility]-cough with Depo shot 08/20/2018-[facility] 08/24/2018-[facility] cough and Depo shot   Patient reports adherence to his Symbicort 160 2 puffs  twice daily.  Patient reports adherence to using his CPAP every night.  Patient's wife reports that actually the CPAP helps with his cough.   Tests:  07/26/2018-echocardiogram-LV ejection fraction 60 to 65%, moderate LVH, PA P pressure 34, grade 1 diastolic dysfunction  NPSG 03/24/03- AHI 88/ hr, desaturation to 75%, body weight 244 lbs  PFT 06/01/17-WNL-FVC 4.46/96%, FEV1 3.71/106%, ratio 0.83, FEF 25-75% 4.64/167%, no response to dilator, TLC 107%, DLCO 101%    FENO:  No results found for: NITRICOXIDE  PFT: PFT Results Latest Ref Rng & Units 06/01/2017  FVC-Pre L 4.31  FVC-Predicted Pre % 92  FVC-Post L 4.46  FVC-Predicted Post % 96  Pre FEV1/FVC % % 81  Post FEV1/FCV % % 83  FEV1-Pre L 3.49  FEV1-Predicted Pre % 100  FEV1-Post L 3.71  DLCO UNC% % 101  DLCO COR %Predicted % 98  TLC L 7.52  TLC % Predicted % 107  RV % Predicted % 124    Imaging: No results found.  Chart Review:    Specialty Problems      Pulmonary Problems   Chronic bronchitis (HCC)    Qualifier: Diagnosis of  By: Lorrin Mais        Obstructive sleep apnea    CPAP 12 APS       Seasonal and perennial allergic rhinitis    Qualifier: Diagnosis of  By: Lorrin Mais        Acute asthmatic bronchitis   Asthma   OSA on CPAP      Allergies  Allergen Reactions  . Amiodarone Swelling  . Codeine Itching  . Tramadol     dellusion     Immunization History  Administered Date(s) Administered  . Influenza,  High Dose Seasonal PF 06/28/2018  . Influenza,inj,Quad PF,6+ Mos 10/31/2016, 07/12/2017  . Pneumococcal Polysaccharide-23 07/12/2017    Past Medical History:  Diagnosis Date  . Allergic rhinitis   . Anxiety   . Arthritis   . Asthma   . Bladder tumor   . Chronic fatigue   . Chronic fatigue   . Depression    06/30/17 Pt denies being depressed, reports Effexor is taken for Chronic Fatigue   . Dyspnea   . Enlarged prostate   . Fibromyalgia   . GERD (gastroesophageal  reflux disease)   . Headache   . History of chronic bronchitis   . History of migraine   . History of toxic encephalopathy   . Hypothyroidism   . OSA on CPAP    CPAP 14  . PAF (paroxysmal atrial fibrillation) ([place]) CARDIOLOGIST -- DR [name]   DX OCT 2013  . 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 Unspecified essential hypertension   . Urethral tumor    PROSTATIC    Tobacco History: Social History   Tobacco Use  Smoking Status Former Smoker  . Packs/day: 0.50  . Years: 27.00  . Pack years: 13.50  . Types: Cigarettes  . Last attempt to quit: 10/21/1979  . Years since quitting: 38.9  Smokeless Tobacco Never Used   Counseling given: Yes  Continue to not smoke  Outpatient Encounter Medications as of 09/22/2018  Medication Sig  . acetaminophen (TYLENOL) 500 MG tablet Take 500 mg by mouth every 6 (six) hours as needed for moderate pain.  Marland Kitchen albuterol (VENTOLIN HFA) 108 (90 Base) MCG/ACT inhaler INHALE 2 PUFFS INTO LUNGS EVERY 6 HOURS AS NEEDED FOR WHEEZING OR SHORTNESS OF BREATH  . azelastine (ASTELIN) 137 MCG/SPRAY nasal spray Place 1-2 sprays into the nose 2 (two) times daily as needed for rhinitis or allergies.   . benzonatate (TESSALON) 200 MG capsule Take 1 capsule by mouth every 8 hours as needed for cough  . budesonide-formoterol (SYMBICORT) 160-4.5 MCG/ACT inhaler INHALE 2 PUFFS BY MOUTH TWICE DAILY  . Cholecalciferol (VITAMIN D-3) 5000 UNITS TABS Take 5,000 Units by mouth daily with breakfast.   . COLCRYS 0.6 MG tablet Take 0.6 mg by mouth daily. With breakfast  . Cyanocobalamin (VITAMIN B12 SL) Place 1,200 mcg under the tongue daily.   Marland Kitchen dextromethorphan (DELSYM) 30 MG/5ML liquid Take 30 mg by mouth at bedtime as needed for cough.   Marland Kitchen DHEA 50 MG TABS Take 50 mg by mouth daily with breakfast.   . docusate sodium (COLACE) 100 MG capsule Take 1 capsule (100 mg total) by mouth daily as needed.  Marland Kitchen EPINEPHrine 0.3 mg/0.3 mL IJ SOAJ injection Inject 0.3 mLs (0.3 mg total) into the muscle as  needed (for angioedema).  . Ferrous Sulfate (CVS SLOW RELEASE IRON PO) Take 45 mg by mouth 2 (two) times daily.  . finasteride (PROSCAR) 5 MG tablet Take 5 mg by mouth at bedtime.   . Hypromellose (ALZAIR ALLERGY NASAL SPRAY NA) Place 1 spray into both nostrils daily as needed (for allergies).   Marland Kitchen ipratropium (ATROVENT) 0.06 % nasal spray INSTILL 2 SPRAYS IN EACH NOSTRIL TID PRF RHINORRHEA  . levalbuterol (XOPENEX HFA) 45 MCG/ACT inhaler INHALE 2 PUFFS VIA SPACER Q 6 HOURS UNTIL SYMPTOMS IMPROVE.  Marland Kitchen levothyroxine (SYNTHROID, LEVOTHROID) 112 MCG tablet Take 112 mcg by mouth daily before breakfast.   . liothyronine (CYTOMEL) 25 MCG tablet Take 25 mcg by mouth daily.   Marland Kitchen loratadine (CLARITIN) 10 MG tablet Take 10 mg by  mouth daily.  Marland Kitchen losartan (COZAAR) 25 MG tablet Take 1 tablet (25 mg total) by mouth daily. (Patient taking differently: Take 25 mg by mouth daily. Takes 2 tablets every am)  . Menthol, Topical Analgesic, (BLUE-EMU MAXIMUM STRENGTH EX) Apply 1 application topically 4 (four) times daily as needed (for arthritis pain.).  Marland Kitchen metoprolol tartrate (LOPRESSOR) 50 MG tablet TAKE 1 TABLET(50 MG) BY MOUTH TWICE DAILY  . Multiple Vitamins-Minerals (ONE-A-DAY MENS HEALTH FORMULA PO) Take 1 tablet by mouth daily with breakfast.   . omeprazole (PRILOSEC) 40 MG capsule Take 1 capsule (40 mg total) by mouth 2 (two) times daily.  . rivaroxaban (XARELTO) 20 MG TABS tablet Take 1 tablet (20 mg total) daily with supper by mouth.  . rizatriptan (MAXALT) 10 MG tablet Take 10 mg by mouth as needed for migraine. May repeat in 2 hours if needed  . Spacer/Aero-Holding Chambers (VALVED HOLDING CHAMBER) DEVI See admin instructions. use with inhaler  . tamsulosin (FLOMAX) 0.4 MG CAPS capsule Take 0.4 mg by mouth daily after supper.  . testosterone cypionate (DEPOTESTOTERONE CYPIONATE) 100 MG/ML injection Inject 200 mg into the muscle every Thursday. For IM use only   . tetrahydrozoline-zinc (VISINE-AC) 0.05-0.25 %  ophthalmic solution Place 1 drop into both eyes as needed (dry eyes).  . venlafaxine (EFFEXOR-XR) 150 MG 24 hr capsule Take 150 mg by mouth daily with breakfast.   . [DISCONTINUED] benzonatate (TESSALON) 200 MG capsule TK 1 C PO Q 8 TO 12 H PRF COUGH  . [EXPIRED] methylPREDNISolone acetate (DEPO-MEDROL) injection 120 mg    No facility-administered encounter medications on file as of 09/22/2018.      Review of Systems  Review of Systems  Constitutional: Positive for fatigue. Negative for activity change, chills, fever and unexpected weight change.  HENT: Positive for congestion and postnasal drip. Negative for rhinorrhea, sinus pressure, sinus pain, sneezing and sore throat.   Eyes: Negative.   Respiratory: Positive for cough (clear mucous then dry cough ), chest tightness, shortness of breath and wheezing.   Cardiovascular: Negative for chest pain and palpitations.  Gastrointestinal: Negative for constipation, diarrhea, nausea and vomiting.  Endocrine: Negative.   Musculoskeletal: Positive for gait problem (trouble walking for the last week ).  Skin: Negative.   Neurological: Positive for dizziness (randomly ). Negative for headaches.  Psychiatric/Behavioral: Negative.  Negative for dysphoric mood. The patient is not nervous/anxious.   All other systems reviewed and are negative.    Physical Exam  BP 122/74 (BP Location: Left Arm, Cuff Size: Normal)   Pulse 80   Ht 5' 9.25" (1.759 m)   Wt 270 lb (122.5 kg)   SpO2 93%   BMI 39.58 kg/m   Wt Readings from Last 5 Encounters:  09/22/18 270 lb (122.5 kg)  07/29/18 270 lb 12.8 oz (122.8 kg)  03/24/18 265 lb (120.2 kg)  03/11/18 265 lb (120.2 kg)  03/05/18 265 lb 12.8 oz (120.6 kg)    Physical Exam  Constitutional: He is oriented to person, place, and time and well-developed, well-nourished, and in no distress. No distress.  HENT:  Head: Normocephalic and atraumatic.  Right Ear: Hearing, tympanic membrane, external ear and ear  canal normal.  Left Ear: Hearing, tympanic membrane, external ear and ear canal normal.  Nose: Mucosal edema present. Right sinus exhibits no maxillary sinus tenderness and no frontal sinus tenderness. Left sinus exhibits no maxillary sinus tenderness and no frontal sinus tenderness.  Mouth/Throat: Uvula is midline and oropharynx is clear and moist. No oropharyngeal  exudate.  +TMs with effusion with out infection   Eyes: Pupils are equal, round, and reactive to light.  Neck: Normal range of motion. Neck supple. No JVD present.  Cardiovascular: Normal rate, regular rhythm and normal heart sounds.  Pulmonary/Chest: Effort normal. No accessory muscle usage. No respiratory distress. He has no decreased breath sounds. He has no wheezes. He has rhonchi (clears with cough ) in the left upper field.  Abdominal: Soft. Bowel sounds are normal. There is tenderness.  Musculoskeletal: Normal range of motion. He exhibits no edema.  Lymphadenopathy:    He has no cervical adenopathy.  Neurological: [name], place, and time. Gait normal.  Skin: Skin is warm and dry. He is not diaphoretic. No erythema.  Psychiatric: Mood, memory, affect and judgment normal.  Nursing note and vitals reviewed.     Lab Results:  CBC    Component Value Date/Time   WBC 4.8 03/11/2018 0921   RBC 3.99 (L) 03/11/2018 0921   HGB 9.0 (L) 03/11/2018 0921   HGB 12.1 (L) 07/27/2017 1158   HCT 31.1 (L) 03/11/2018 0921   HCT 36.3 (L) 07/27/2017 1158   PLT 179 03/11/2018 0921   PLT 287 07/27/2017 1158   MCV 77.9 (L) 03/11/2018 0921   MCV 86 07/27/2017 1158   MCH 22.6 (L) 03/11/2018 0921   MCHC 28.9 (L) 03/11/2018 0921   RDW 17.6 (H) 03/11/2018 0921   RDW 14.4 07/27/2017 1158   LYMPHSABS 0.7 03/07/2018 2002   MONOABS 1.4 (H) 03/07/2018 2002   EOSABS 0.1 03/07/2018 2002   BASOSABS 0.0 03/07/2018 2002    BMET    Component Value Date/Time   NA 140 03/11/2018 0921   NA 138 07/27/2017 1158   K  3.6 03/11/2018 0921   CL 103 03/11/2018 0921   CO2 28 03/11/2018 0921   GLUCOSE 118 (H) 03/11/2018 0921   BUN <5 (L) 03/11/2018 0921   BUN 11 07/27/2017 1158   CREATININE 0.94 03/11/2018 0921   CREATININE 1.03 01/18/2016 1004   CALCIUM 8.8 (L) 03/11/2018 0921   GFRNONAA >60 03/11/2018 0921   GFRAA >60 03/11/2018 0921    BNP    Component Value Date/Time   BNP 466.5 (H) 03/08/2018 1755    ProBNP No results found for: PROBNP    Assessment & Plan:   67 year old male patient completing follow-up with our office today.  Patient with suspected chronic bronchitis-like symptoms.  Will treat with Depo-Medrol injection today.  Patient is unable to tolerate p.o. steroids.  We will also treat with Z-Pak.  Patient to follow-up with primary care regarding dizziness.    Patient can follow-up with our office in 4 to 6 weeks to ensure symptoms are improving or sooner if needed.  Chronic bronchitis Azithromycin 250mg  tablet  >>>Take 2 tablets (500mg  total) today, and then 1 tablet (250mg ) for the next four days  >>>take with food  >>>can also take probiotic and / or yogurt while on antibiotic   Depo 120 today   Continue Symbicort 160 >>> 2 puffs in the morning right when you wake up, rinse out your mouth after use, 12 hours later 2 puffs, rinse after use  Follow up in 4-6 weeks with our office >>> Take this daily, no matter what >>> This is not a rescue inhaler   Can use nebulized medications every 4-6 hours as needed for shortness of breath and wheezing  If symptoms worsen or you have more coughing spells were you stop breathing  you need to present to the emergency room or call 911   Obstructive sleep apnea We recommend that you continue using your CPAP daily >>>Keep up the hard work using your device >>> Goal should be wearing this for the entire night that you are sleeping, at least 4 to 6 hours  Remember:  . Do not drive or operate heavy machinery if tired or drowsy.   . Please notify the supply company and office if you are unable to use your device regularly due to missing supplies or machine being broken.  . Work on maintaining a healthy weight and following your recommended nutrition plan  . Maintain proper daily exercise and movement  . Maintaining proper use of your device can also help improve management of other chronic illnesses such as: Blood pressure, blood sugars, and weight management.   BiPAP/ CPAP Cleaning:  >>>Clean weekly, with Dawn soap, and bottle brush.  Set up to air dry.    This appointment was 32 minutes along with over 50% that time direct face-to-face patient care, assessment, plan of care follow-up   Lauraine Rinne, NP 09/22/2018

## 2018-09-22 ENCOUNTER — Encounter: Payer: Self-pay | Admitting: Pulmonary Disease

## 2018-09-22 ENCOUNTER — Ambulatory Visit: Payer: Medicare Other | Admitting: Pulmonary Disease

## 2018-09-22 VITALS — BP 122/74 | HR 80 | Ht 69.25 in | Wt 270.0 lb

## 2018-09-22 DIAGNOSIS — J410 Simple chronic bronchitis: Secondary | ICD-10-CM | POA: Diagnosis not present

## 2018-09-22 DIAGNOSIS — G4733 Obstructive sleep apnea (adult) (pediatric): Secondary | ICD-10-CM | POA: Diagnosis not present

## 2018-09-22 DIAGNOSIS — R05 Cough: Secondary | ICD-10-CM | POA: Diagnosis not present

## 2018-09-22 DIAGNOSIS — R059 Cough, unspecified: Secondary | ICD-10-CM

## 2018-09-22 MED ORDER — METHYLPREDNISOLONE ACETATE 80 MG/ML IJ SUSP
120.0000 mg | Freq: Once | INTRAMUSCULAR | Status: AC
  Administered 2018-09-22: 120 mg via INTRAMUSCULAR

## 2018-09-22 MED ORDER — BENZONATATE 200 MG PO CAPS: ORAL_CAPSULE | ORAL | 1 refills | Status: DC

## 2018-09-22 NOTE — Patient Instructions (Addendum)
Azithromycin 250mg  tablet  >>>Take 2 tablets (500mg  total) today, and then 1 tablet (250mg ) for the next four days  >>>take with food  >>>can also take probiotic and / or yogurt while on antibiotic   Depo 120 today   Continue Symbicort 160 >>> 2 puffs in the morning right when you wake up, rinse out your mouth after use, 12 hours later 2 puffs, rinse after use >>> Take this daily, no matter what >>> This is not a rescue inhaler   Can use nebulized medications every 4-6 hours as needed for shortness of breath and wheezing  We recommend that you continue using your CPAP daily >>>Keep up the hard work using your device >>> Goal should be wearing this for the entire night that you are sleeping, at least 4 to 6 hours  Remember:  . Do not drive or operate heavy machinery if tired or drowsy.  . Please notify the supply company and office if you are unable to use your device regularly due to missing supplies or machine being broken.  . Work on maintaining a healthy weight and following your recommended nutrition plan  . Maintain proper daily exercise and movement  . Maintaining proper use of your device can also help improve management of other chronic illnesses such as: Blood pressure, blood sugars, and weight management.   BiPAP/ CPAP Cleaning:  >>>Clean weekly, with Dawn soap, and bottle brush.  Set up to air dry.   Follow-up with PCP regarding dizzy spells  Follow up in 4-6 weeks with our office   If symptoms worsen or you have more coughing spells were you stop breathing you need to present to the emergency room or call 911   It is flu season:   >>>Remember to be washing your hands regularly, using hand sanitizer, be careful to use around herself with has contact with people who are sick will increase her chances of getting sick yourself. >>> Best ways to protect herself from the flu: Receive the yearly flu vaccine, practice good hand hygiene washing with soap and also using hand  sanitizer when available, eat a nutritious meals, get adequate rest, hydrate appropriately   Please contact the office if your symptoms worsen or you have concerns that you are not improving.   Thank you for choosing  [facility] for your healthcare, and for allowing [place] to partner with you on your healthcare journey. I am thankful to be able to provide care to you today.   Wyn Quaker FNP-C    Acute Bronchitis, Adult Acute bronchitis is sudden (acute) swelling of the air tubes (bronchi) in the lungs. Acute bronchitis causes these tubes to fill with mucus, which can make it hard to breathe. It can also cause coughing or wheezing. In adults, acute bronchitis usually goes away within 2 weeks. A cough caused by bronchitis may last up to 3 weeks. Smoking, allergies, and asthma can make the condition worse. Repeated episodes of bronchitis may cause further lung problems, such as chronic obstructive pulmonary disease (COPD). What are the causes? This condition can be caused by germs and by substances that irritate the lungs, including:  Cold and flu viruses. This condition is most often caused by the same virus that causes a cold.  Bacteria.  Exposure to tobacco smoke, dust, fumes, and air pollution.  What increases the risk? This condition is more likely to develop in people who:  Have close contact with someone with acute bronchitis.  Are exposed to lung irritants, such  as tobacco smoke, dust, fumes, and vapors.  Have a weak immune system.  Have a respiratory condition such as asthma.  What are the signs or symptoms? Symptoms of this condition include:  A cough.  Coughing up clear, yellow, or green mucus.  Wheezing.  Chest congestion.  Shortness of breath.  A fever.  Body aches.  Chills.  A sore throat.  How is this diagnosed? This condition is usually diagnosed with a physical exam. During the exam, your health care provider may order tests, such as chest  X-rays, to rule out other conditions. He or she may also:  Test a sample of your mucus for bacterial infection.  Check the level of oxygen in your blood. This is done to check for pneumonia.  Do a chest X-ray or lung function testing to rule out pneumonia and other conditions.  Perform blood tests.  Your health care provider will also ask about your symptoms and medical history. How is this treated? Most cases of acute bronchitis clear up over time without treatment. Your health care provider may recommend:  Drinking more fluids. Drinking more makes your mucus thinner, which may make it easier to breathe.  Taking a medicine for a fever or cough.  Taking an antibiotic medicine.  Using an inhaler to help improve shortness of breath and to control a cough.  Using a cool mist vaporizer or humidifier to make it easier to breathe.  Follow these instructions at home: Medicines  Take over-the-counter and prescription medicines only as told by your health care provider.  If you were prescribed an antibiotic, take it as told by your health care provider. Do not stop taking the antibiotic even if you start to feel better. General instructions  Get plenty of rest.  Drink enough fluids to keep your urine clear or pale yellow.  Avoid smoking and secondhand smoke. Exposure to cigarette smoke or irritating chemicals will make bronchitis worse. If you smoke and you need help quitting, ask your health care provider. Quitting smoking will help your lungs heal faster.  Use an inhaler, cool mist vaporizer, or humidifier as told by your health care provider.  Keep all follow-up visits as told by your health care provider. This is important. How is this prevented? To lower your risk of getting this condition again:  Wash your hands often with soap and water. If soap and water are not available, use hand sanitizer.  Avoid contact with people who have cold symptoms.  Try not to touch your  hands to your mouth, nose, or eyes.  Make sure to get the flu shot every year.  Contact a health care provider if:  Your symptoms do not improve in 2 weeks of treatment. Get help right away if:  You cough up blood.  You have chest pain.  You have severe shortness of breath.  You become dehydrated.  You faint or keep feeling like you are going to faint.  You keep vomiting.  You have a severe headache.  Your fever or chills gets worse. This information is not intended to replace advice given to you by your health care provider. Make sure you discuss any questions you have with your health care provider. Document Released: 11/13/2004 Document Revised: 04/30/2016 Document Reviewed: 03/26/2016 Elsevier Interactive Patient Education  [employer].

## 2018-09-22 NOTE — Assessment & Plan Note (Signed)
Azithromycin 250mg  tablet  >>>Take 2 tablets (500mg  total) today, and then 1 tablet (250mg ) for the next four days  >>>take with food  >>>can also take probiotic and / or yogurt while on antibiotic   Depo 120 today   Continue Symbicort 160 >>> 2 puffs in the morning right when you wake up, rinse out your mouth after use, 12 hours later 2 puffs, rinse after use  Follow up in 4-6 weeks with our office >>> Take this daily, no matter what >>> This is not a rescue inhaler   Can use nebulized medications every 4-6 hours as needed for shortness of breath and wheezing  If symptoms worsen or you have more coughing spells were you stop breathing you need to present to the emergency room or call 911

## 2018-09-22 NOTE — Assessment & Plan Note (Signed)

## 2018-09-23 ENCOUNTER — Telehealth: Payer: Self-pay | Admitting: Pulmonary Disease

## 2018-09-23 MED ORDER — AZITHROMYCIN 250 MG PO TABS: ORAL_TABLET | ORAL | 0 refills | Status: DC

## 2018-09-23 NOTE — Telephone Encounter (Signed)
Called and spoke with patient wife Grant [name], per DPR, she states he was supposed to receive a Zpack during office visit yesterday. Confirmed via AVS patient was to receive a Zpack. Confirmed pharmacy. Order sent. Voiced understanding. Nothing further is needed at this time.

## 2018-10-18 DIAGNOSIS — K439 Ventral hernia without obstruction or gangrene: Secondary | ICD-10-CM | POA: Diagnosis not present

## 2018-10-18 DIAGNOSIS — R5383 Other fatigue: Secondary | ICD-10-CM | POA: Diagnosis not present

## 2018-10-18 DIAGNOSIS — J209 Acute bronchitis, unspecified: Secondary | ICD-10-CM | POA: Diagnosis not present

## 2018-10-22 ENCOUNTER — Other Ambulatory Visit: Payer: Self-pay | Admitting: Internal Medicine

## 2018-10-26 NOTE — Progress Notes (Signed)
@Patient  ID: Grant [name], male    DOB: 19-Feb-1951, 68 y.o.   MRN: [id number]  Chief Complaint  Patient presents with  . Follow-up    Referring provider: Shirline Frees, MD  HPI:  68 year old male former smoker followed in our office for asthma, chronic bronchitis, allergic rhinitis, obstructive sleep apnea  PMH: Angioedema, PAF/anticoagulation, aortic valve replacement, toxic encephalopathy, light sensitivity, chronic fatigue, fibromyalgia, s/p Bentall procedure, HTN Smoker/ Smoking History: Former smoker.  13.5 pack years Maintenance: Symbicort 160 Pt of: Dr. Annamaria Boots   10/27/2018  - Visit   68 year old male former smoker (13.5 pack years) presenting today for a follow-up visit on cough.  Patient reports she is been dealing with a chronic cough for the last 4 months.  Patient reports very limited improvement.  See timeline below.  Patient timeline listed below: 06/10/2018-[facility]-cough and congestion 06/25/2018-flu shot and Prevnar 07/22/2018- returned from beach with bad cough 07/26/2018- echo with [name] 08/11/2018-[facility]-cough with Depo shot 08/20/2018-[facility] 08/24/2018-[facility] cough and Depo shot 09/22/18 -[facility]-Z-Pak and Depo-Medrol injection 12/30/2019[name]- Depo-Medrol and Levaquin  Patient reports that the cough has worsened his already chronic baseline fatigue.  Patient reports that Depo-Medrol injections help his symptoms for about 2 to 3 days.  Despite multiple rounds of antibiotics patient has not seen improvement with his cough.  Patient has 1 more day left of Levaquin that was prescribed on 10/18/2018 patient reports that this has not helped or improved his cough.  Patient believes he has never been evaluated by an ENT if so it was many years ago.  Patient reports adherence to his other medications.  Typically dry cough, occasionally productive with clear sputum.   CPAP compliance report showing  excellent compliance 30 out of 30 days used.  All 30 those days greater than 4 hours.  Average usage 10 hours and 28 minutes.  CPAP setting 12.  AHI 2.6.     Tests:   01/13/2018-CT Angio- lungs are clear, no pleural effusion or pneumothorax  07/26/2018-echocardiogram-LV ejection fraction 60 to 65%, moderate LVH, grade 1 diastolic dysfunction, PA P pressure 34  NPSG 03/24/03- AHI 88/ hr, desaturation to 75%, body weight 244 lbs  PFT 06/01/17-WNL-FVC 4.46/96%, FEV1 3.71/106%, ratio 0.83, FEF 25-75% 4.64/167%, no response to dilator, TLC 107%, DLCO 101%    FENO:  No results found for: NITRICOXIDE  PFT: PFT Results Latest Ref Rng & Units 06/01/2017  FVC-Pre L 4.31  FVC-Predicted Pre % 92  FVC-Post L 4.46  FVC-Predicted Post % 96  Pre FEV1/FVC % % 81  Post FEV1/FCV % % 83  FEV1-Pre L 3.49  FEV1-Predicted Pre % 100  FEV1-Post L 3.71  DLCO UNC% % 101  DLCO COR %Predicted % 98  TLC L 7.52  TLC % Predicted % 107  RV % Predicted % 124    Imaging: No results found.    Specialty Problems      Pulmonary Problems   Chronic bronchitis (HCC)    Qualifier: Diagnosis of  By: Lorrin Mais        Obstructive sleep apnea    CPAP 12 APS       Seasonal and perennial allergic rhinitis    Qualifier: Diagnosis of  By: Lorrin Mais        Acute asthmatic bronchitis   Asthma   OSA on CPAP   Cough      Allergies  Allergen Reactions  . Amiodarone Swelling  . Codeine  Itching  . Tramadol     dellusion     Immunization History  Administered Date(s) Administered  . Influenza, High Dose Seasonal PF 06/28/2018  . Influenza,inj,Quad PF,6+ Mos 10/31/2016, 07/12/2017  . Pneumococcal Polysaccharide-23 07/12/2017    Past Medical History:  Diagnosis Date  . Allergic rhinitis   . Anxiety   . Arthritis   . Asthma   . Bladder tumor   . Chronic fatigue   . Chronic fatigue   . Depression    06/30/17 Pt denies being depressed, reports Effexor is taken for Chronic  Fatigue   . Dyspnea   . Enlarged prostate   . Fibromyalgia   . GERD (gastroesophageal reflux disease)   . Headache   . History of chronic bronchitis   . History of migraine   . History of toxic encephalopathy   . Hypothyroidism   . OSA on CPAP    CPAP 14  . PAF (paroxysmal atrial fibrillation) ([place]) CARDIOLOGIST -- DR [name]   DX OCT 2013  . 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 Unspecified essential hypertension   . Urethral tumor    PROSTATIC    Tobacco History: Social History   Tobacco Use  Smoking Status Former Smoker  . Packs/day: 0.50  . Years: 27.00  . Pack years: 13.50  . Types: Cigarettes  . Last attempt to quit: 10/21/1979  . Years since quitting: 39.0  Smokeless Tobacco Never Used   Counseling given: Yes  We recommend that you continue to not smoke  Outpatient Encounter Medications as of 10/27/2018  Medication Sig  . acetaminophen (TYLENOL) 500 MG tablet Take 500 mg by mouth every 6 (six) hours as needed for moderate pain.  Marland Kitchen albuterol (VENTOLIN HFA) 108 (90 Base) MCG/ACT inhaler INHALE 2 PUFFS INTO LUNGS EVERY 6 HOURS AS NEEDED FOR WHEEZING OR SHORTNESS OF BREATH  . azelastine (ASTELIN) 137 MCG/SPRAY nasal spray Place 1-2 sprays into the nose 2 (two) times daily as needed for rhinitis or allergies.   . benzonatate (TESSALON) 200 MG capsule Take 1 capsule by mouth every 8 hours as needed for cough  . budesonide-formoterol (SYMBICORT) 160-4.5 MCG/ACT inhaler INHALE 2 PUFFS BY MOUTH TWICE DAILY  . Cholecalciferol (VITAMIN D-3) 5000 UNITS TABS Take 5,000 Units by mouth daily with breakfast.   . COLCRYS 0.6 MG tablet Take 0.6 mg by mouth daily. With breakfast  . Cyanocobalamin (VITAMIN B12 SL) Place 1,200 mcg under the tongue daily.   Marland Kitchen dextromethorphan (DELSYM) 30 MG/5ML liquid Take 30 mg by mouth at bedtime as needed for cough.   Marland Kitchen DHEA 50 MG TABS Take 50 mg by mouth daily with breakfast.   . docusate sodium (COLACE) 100 MG capsule Take 1 capsule (100 mg total) by mouth  daily as needed.  Marland Kitchen EPINEPHrine 0.3 mg/0.3 mL IJ SOAJ injection Inject 0.3 mLs (0.3 mg total) into the muscle as needed (for angioedema).  . Ferrous Sulfate (CVS SLOW RELEASE IRON PO) Take 45 mg by mouth 2 (two) times daily.  . finasteride (PROSCAR) 5 MG tablet Take 5 mg by mouth at bedtime.   . Hypromellose (ALZAIR ALLERGY NASAL SPRAY NA) Place 1 spray into both nostrils daily as needed (for allergies).   Marland Kitchen ipratropium (ATROVENT) 0.06 % nasal spray INSTILL 2 SPRAYS IN EACH NOSTRIL TID PRF RHINORRHEA  . levalbuterol (XOPENEX HFA) 45 MCG/ACT inhaler INHALE 2 PUFFS VIA SPACER Q 6 HOURS UNTIL SYMPTOMS IMPROVE.  Marland Kitchen levothyroxine (SYNTHROID, LEVOTHROID) 112 MCG tablet Take 112 mcg by mouth daily before breakfast.   .  liothyronine (CYTOMEL) 25 MCG tablet Take 25 mcg by mouth daily.   Marland Kitchen loratadine (CLARITIN) 10 MG tablet Take 10 mg by mouth daily.  Marland Kitchen losartan (COZAAR) 25 MG tablet Take 1 tablet (25 mg total) by mouth daily. (Patient taking differently: Take 25 mg by mouth daily. Takes 2 tablets every am)  . Menthol, Topical Analgesic, (BLUE-EMU MAXIMUM STRENGTH EX) Apply 1 application topically 4 (four) times daily as needed (for arthritis pain.).  Marland Kitchen metoprolol tartrate (LOPRESSOR) 50 MG tablet TAKE 1 TABLET(50 MG) BY MOUTH TWICE DAILY  . Multiple Vitamins-Minerals (ONE-A-DAY MENS HEALTH FORMULA PO) Take 1 tablet by mouth daily with breakfast.   . omeprazole (PRILOSEC) 40 MG capsule Take 1 capsule (40 mg total) by mouth 2 (two) times daily.  . rivaroxaban (XARELTO) 20 MG TABS tablet Take 1 tablet (20 mg total) daily with supper by mouth.  . rizatriptan (MAXALT) 10 MG tablet Take 10 mg by mouth as needed for migraine. May repeat in 2 hours if needed  . Spacer/Aero-Holding Chambers (VALVED HOLDING CHAMBER) DEVI See admin instructions. use with inhaler  . tamsulosin (FLOMAX) 0.4 MG CAPS capsule Take 0.4 mg by mouth daily after supper.  . testosterone cypionate (DEPOTESTOTERONE CYPIONATE) 100 MG/ML injection  Inject 200 mg into the muscle every Thursday. For IM use only   . tetrahydrozoline-zinc (VISINE-AC) 0.05-0.25 % ophthalmic solution Place 1 drop into both eyes as needed (dry eyes).  . venlafaxine (EFFEXOR-XR) 150 MG 24 hr capsule Take 150 mg by mouth daily with breakfast.   . [DISCONTINUED] azithromycin (ZITHROMAX) 250 MG tablet Take 2 tablets today, then 1 tablet daily until gone. (Patient not taking: Reported on 10/27/2018)   No facility-administered encounter medications on file as of 10/27/2018.      Review of Systems  Review of Systems  Constitutional: Positive for fatigue.  Respiratory: Positive for cough (Typically dry cough occasionally productive with clear sputum).     Limited ROS as patient refused to complete office visit today.  Unable to complete cough ROS listed below as patient refused to complete office visit today.  Cough ROS:   When to the symptoms start: 4 months ago  How are you today: no better  Have you had fever/sore throat (first 5 to 7 days of URI) or Have you had cough/nasal congestion (10 to 14 days of URI) : cough Have you used anything to treat the cough, as anything improved : depo medrol Is it a dry or wet cough: dry, occasionally productive Does the cough happen when your breathing or when you breathe out: Other any triggers to your cough, or any aggravating factors:  Daily antihistamine: GERD treatment: prilosec Singulair:  Cough checklist (bolded indicates presence):  Adherence, acid reflux, ACE inhibitor, active sinus disease, active smoking, adverse effects of medications (amiodarone/Macrodantin/bb), alpha 1, allergies, aspiration, anxiety, bronchiectasis, congestive heart failure (diastolic)     Physical Exam  BP 130/82 (BP Location: Left Arm, Cuff Size: Large)   Pulse 84   Ht 5\' 10"  (1.778 m)   Wt 267 lb 3.2 oz (121.2 kg)   SpO2 97%   BMI 38.34 kg/m   Wt Readings from Last 5 Encounters:  10/27/18 267 lb 3.2 oz (121.2 kg)    09/22/18 270 lb (122.5 kg)  07/29/18 270 lb 12.8 oz (122.8 kg)  03/24/18 265 lb (120.2 kg)  03/11/18 265 lb (120.2 kg)   Physical exam >>>Unable to complete physical exam as patient refused physical exam when he was informed he would not receive a  Depo-Medrol injection today.  This would be his fifth Depo-Medrol injection since October/2019. >>> Patient became visibly upset and frustrated when I informed him that I do not believe the fifth Depo-Medrol injection would be appropriate today >>> Patient stated "I have been doing this way too long I do not need a physical assessment today", patient is willing to complete sputum culture which we initially discussed at the beginning of the office visit   Lab Results:  CBC    Component Value Date/Time   WBC 4.8 03/11/2018 0921   RBC 3.99 (L) 03/11/2018 0921   HGB 9.0 (L) 03/11/2018 0921   HGB 12.1 (L) 07/27/2017 1158   HCT 31.1 (L) 03/11/2018 0921   HCT 36.3 (L) 07/27/2017 1158   PLT 179 03/11/2018 0921   PLT 287 07/27/2017 1158   MCV 77.9 (L) 03/11/2018 0921   MCV 86 07/27/2017 1158   MCH 22.6 (L) 03/11/2018 0921   MCHC 28.9 (L) 03/11/2018 0921   RDW 17.6 (H) 03/11/2018 0921   RDW 14.4 07/27/2017 1158   LYMPHSABS 0.7 03/07/2018 2002   MONOABS 1.4 (H) 03/07/2018 2002   EOSABS 0.1 03/07/2018 2002   BASOSABS 0.0 03/07/2018 2002    BMET    Component Value Date/Time   NA 140 03/11/2018 0921   NA 138 07/27/2017 1158   K 3.6 03/11/2018 0921   CL 103 03/11/2018 0921   CO2 28 03/11/2018 0921   GLUCOSE 118 (H) 03/11/2018 0921   BUN <5 (L) 03/11/2018 0921   BUN 11 07/27/2017 1158   CREATININE 0.94 03/11/2018 0921   CREATININE 1.03 01/18/2016 1004   CALCIUM 8.8 (L) 03/11/2018 0921   GFRNONAA >60 03/11/2018 0921   GFRAA >60 03/11/2018 0921    BNP    Component Value Date/Time   BNP 466.5 (H) 03/08/2018 1755    ProBNP No results found for: PROBNP    Assessment & Plan:   I have significant concerns regarding Mr.  Beier.  He continues to have a cyclical and worsening cough over the last 4 months.  Patient reports this is worsened his fatigue.  The typical approach to how we have managed this cough as well as other offices have managed his cough have been antibiotics and Depo-Medrol injections at the patient's request.  This clearly is not working.  I believe the patient needs a sputum culture to further evaluate if antibiotics are indicated.  Could consider further imaging or even a work-up for cough neuropathy.  Unfortunately we could not discuss this further as the patient became visibly frustrated and upset when I refused to give the patient another Depo-Medrol injection today.  This would have been his fifth Depo-Medrol injection since October/2020.  Patient reports himself that the Depo-Medrol injections only work for about 2 to 3 days.  I do not believe this is a appropriate medical intervention.  As I believe his cough needs a further work-up.  Unfortunately the patient would not complete his review of systems or allow me to do a physical exam today.  Thankfully the patient was willing to take a sputum culture cup and is willing to produce a sputum sample for [place] to further evaluate if antibiotics are needed.    Obstructive sleep apnea Plan: Continue CPAP therapy as prescribed  Acute asthmatic bronchitis Plan: Continue Symbicort 160 as prescribed Continue other medications as prescribed  Chronic bronchitis Plan: Sputum sample to be tested 3 ways to evaluate chronic cough  Cough Assessment: Patient continues to present to our offices  to receive antibiotics and Depo-Medrol injections. Patient reports himself that this is not help manage his cough Patient reports the cough is been going on for 4 months Patient reports the cough is mainly dry Patient reports adherence to medications   Plan: -Sputum culture today -I suspect the patient has potentially vocal cord dysfunction in the Depo-Medrol  provide some relief as this causes his vocal cords to be less inflamed for a couple of days -Patient did not allow a physical exam or further interview today so unable to fully say -We will have patient follow-up with Dr. Annamaria Boots at first available -I do not believe it is medically appropriate to give the patient 1/5 Depo-Medrol injection today as by his own account this has never truly helped resolve the cough -I would have plan to do a chest x-ray today but patient refused physical assessment Could consider referral to ENT or work-up for cough neuropathy in the future      Lauraine Rinne, NP 10/27/2018   This appointment was 26 minutes along with over 50% of the time in direct face-to-face patient care, assessment, plan of care, and follow-up.

## 2018-10-27 ENCOUNTER — Encounter: Payer: Self-pay | Admitting: Pulmonary Disease

## 2018-10-27 ENCOUNTER — Ambulatory Visit: Payer: Medicare Other | Admitting: Pulmonary Disease

## 2018-10-27 VITALS — BP 130/82 | HR 84 | Ht 70.0 in | Wt 267.2 lb

## 2018-10-27 DIAGNOSIS — R059 Cough, unspecified: Secondary | ICD-10-CM

## 2018-10-27 DIAGNOSIS — J45909 Unspecified asthma, uncomplicated: Secondary | ICD-10-CM | POA: Diagnosis not present

## 2018-10-27 DIAGNOSIS — J410 Simple chronic bronchitis: Secondary | ICD-10-CM | POA: Diagnosis not present

## 2018-10-27 DIAGNOSIS — R05 Cough: Secondary | ICD-10-CM | POA: Diagnosis not present

## 2018-10-27 DIAGNOSIS — G4733 Obstructive sleep apnea (adult) (pediatric): Secondary | ICD-10-CM | POA: Diagnosis not present

## 2018-10-27 NOTE — Patient Instructions (Signed)
You refused physical assessment today  You left the appointment before I could examine you  We will provide a sputum cup >>> Produce other sputum sample and bring this to the [place] lab location - in the basement  >>> This will let us know if there is a bacteria growing  I recommend that you follow-up with Dr. Annamaria Boots his first available  It is flu season:   >>>Remember to be washing your hands regularly, using hand sanitizer, be careful to use around herself with has contact with people who are sick will increase her chances of getting sick yourself. >>> Best ways to protect herself from the flu: Receive the yearly flu vaccine, practice good hand hygiene washing with soap and also using hand sanitizer when available, eat a nutritious meals, get adequate rest, hydrate appropriately   Please contact the office if your symptoms worsen or you have concerns that you are not improving.   Thank you for choosing [facility] for your healthcare, and for allowing [place] to partner with you on your healthcare journey. I am thankful to be able to provide care to you today.   Wyn Quaker FNP-C

## 2018-10-27 NOTE — Assessment & Plan Note (Signed)
Assessment: Patient continues to present to our offices to receive antibiotics and Depo-Medrol injections. Patient reports himself that this is not help manage his cough Patient reports the cough is been going on for 4 months Patient reports the cough is mainly dry Patient reports adherence to medications   Plan: -Sputum culture today -I suspect the patient has potentially vocal cord dysfunction in the Depo-Medrol provide some relief as this causes his vocal cords to be less inflamed for a couple of days -Patient did not allow a physical exam or further interview today so unable to fully say -We will have patient follow-up with Dr. Annamaria Boots at first available -I do not believe it is medically appropriate to give the patient 1/5 Depo-Medrol injection today as by his own account this has never truly helped resolve the cough -I would have plan to do a chest x-ray today but patient refused physical assessment Could consider referral to ENT or work-up for cough neuropathy in the future

## 2018-10-27 NOTE — Assessment & Plan Note (Signed)
Plan: Continue CPAP therapy as prescribed 

## 2018-10-27 NOTE — Assessment & Plan Note (Signed)
Plan: Sputum sample to be tested 3 ways to evaluate chronic cough

## 2018-10-27 NOTE — Assessment & Plan Note (Signed)
Plan: Continue Symbicort 160 as prescribed Continue other medications as prescribed

## 2018-10-28 ENCOUNTER — Other Ambulatory Visit: Payer: Medicare Other

## 2018-10-28 DIAGNOSIS — R05 Cough: Secondary | ICD-10-CM

## 2018-10-28 DIAGNOSIS — R059 Cough, unspecified: Secondary | ICD-10-CM

## 2018-11-01 NOTE — Progress Notes (Signed)
Preliminary results of sputum culture have come back showing no bacterial growth.  Showing normal oral flora.  These will continue to grow out for about 6 weeks.  Keep follow-up with our office with Dr. Annamaria Boots.  Wyn Quaker, FNP

## 2018-11-03 NOTE — Progress Notes (Signed)
Spoke with Juliann Pulse and notified of results/recs and she verbalized understanding and will inform the pt

## 2018-11-08 ENCOUNTER — Encounter: Payer: Self-pay | Admitting: Cardiothoracic Surgery

## 2018-11-08 ENCOUNTER — Telehealth: Payer: Self-pay | Admitting: Internal Medicine

## 2018-11-08 ENCOUNTER — Other Ambulatory Visit: Payer: Self-pay

## 2018-11-08 ENCOUNTER — Ambulatory Visit: Payer: Medicare Other | Admitting: Cardiothoracic Surgery

## 2018-11-08 VITALS — BP 133/83 | HR 68 | Resp 16 | Ht 70.0 in | Wt 267.0 lb

## 2018-11-08 DIAGNOSIS — Z952 Presence of prosthetic heart valve: Secondary | ICD-10-CM | POA: Diagnosis not present

## 2018-11-08 DIAGNOSIS — Z954 Presence of other heart-valve replacement: Secondary | ICD-10-CM | POA: Diagnosis not present

## 2018-11-08 DIAGNOSIS — Z8679 Personal history of other diseases of the circulatory system: Secondary | ICD-10-CM

## 2018-11-08 DIAGNOSIS — Z9889 Other specified postprocedural states: Secondary | ICD-10-CM

## 2018-11-08 MED ORDER — LOSARTAN POTASSIUM 50 MG PO TABS: 50.0000 mg | ORAL_TABLET | Freq: Every day | ORAL | 6 refills | Status: DC

## 2018-11-08 NOTE — Progress Notes (Signed)
PCP is Shirline Frees, MD Referring Provider is Josue Hector, MD  Chief Complaint  Patient presents with  . Routine Post Op    3 month f/u s/p BENTALL/AVR 07/02/17    HPI: Patient returns for 67-month follow-up of blood pressure check. At the last visit he was started on losartan, 50 mg daily for hypertension.  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  His postop 17-month CT scan showed repair intact without pseudoaneurysm.  The patient continues to recover from that long hospitalization complicated by postoperative delirium and pulmonary problems. He currently has had problems with a fairly strong productive cough, and is being evaluated and treated by his pulmonologist. Patient also complains of sternal popping at the lower aspect but on CT scan this appears to be healed and I cannot elicit any instability on exam.  He remains in sinus rhythm.  Today's blood pressure is much better at 130/80. Past Medical History:  Diagnosis Date  . Allergic rhinitis   . Anxiety   . Arthritis   . Asthma   . Bladder tumor   . Chronic fatigue   . Chronic fatigue   . Depression    06/30/17 Pt denies being depressed, reports Effexor is taken for Chronic Fatigue   . Dyspnea   . Enlarged prostate   . Fibromyalgia   . GERD (gastroesophageal reflux disease)   . Headache   . History of chronic bronchitis   . History of migraine   . History of toxic encephalopathy   . Hypothyroidism   . OSA on CPAP    CPAP 14  . PAF (paroxysmal atrial fibrillation) ([place]) CARDIOLOGIST -- DR [name]   DX OCT 2013  . 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 Unspecified essential hypertension   . Urethral tumor    PROSTATIC    Past Surgical History:  Procedure Laterality Date  . BENTALL PROCEDURE N/A 07/02/2017   Procedure: BENTALL PROCEDURE;  Surgeon: Ivin Poot, MD;  Location: [place];  Service: Open Heart Surgery;  Laterality: N/A;  WITH CIRC ARREST  . CARDIAC SURGERY    .  COLONOSCOPY    . CYSTOSCOPY W/ RETROGRADES Bilateral 06/15/2013   Procedure: CYSTOSCOPY WITH BILATERAL RETROGRADE PYELOGRAM  BLADDER BIOPSY, PROSTATIC URETHRAL BIOPSY, ;  Surgeon: Molli Hazard, MD;  Location[facility];  Service: Urology;  Laterality: Bilateral;  . ESOPHAGOGASTRODUODENOSCOPY (EGD) WITH PROPOFOL N/A 03/11/2018   Procedure: ESOPHAGOGASTRODUODENOSCOPY (EGD) WITH PROPOFOL;  Surgeon: Clarene Essex, MD;  Location: [place];  Service: Endoscopy;  Laterality: N/A;  . LAPAROSCOPIC CHOLECYSTECTOMY  01-14-2001  . RIGHT/LEFT HEART CATH AND CORONARY ANGIOGRAPHY N/A 06/12/2017   Procedure: RIGHT/LEFT HEART CATH AND CORONARY ANGIOGRAPHY;  Surgeon: Larey Dresser, MD;  Location: [facility];  Service: Cardiovascular;  Laterality: N/A;  . TEE WITHOUT CARDIOVERSION N/A 07/02/2017   Procedure: TRANSESOPHAGEAL ECHOCARDIOGRAM (TEE);  Surgeon: Prescott Gum, Collier Salina, MD;  Location: [place];  Service: Open Heart Surgery;  Laterality: N/A;  . UMBILICAL HERNIA REPAIR  01-14-2008    Family History  Problem Relation Age of Onset  . Aortic aneurysm Mother 55       cause of death  . Other Father        motor vehicle accident  . Heart disease Unknown        family history    Social History Social History   Tobacco Use  . Smoking status: Former Smoker    Packs/day: 0.50  Years: 27.00    Pack years: 13.50    Types: Cigarettes    Last attempt to quit: 10/21/1979    Years since quitting: 39.0  . Smokeless tobacco: Never Used  Substance Use Topics  . Alcohol use: Yes    Alcohol/week: 21.0 standard drinks    Types: 21 Cans of beer per week    Comment: 4-5 beers a day  . Drug use: No    Current Outpatient Medications  Medication Sig Dispense Refill  . acetaminophen (TYLENOL) 500 MG tablet Take 500 mg by mouth every 6 (six) hours as needed for moderate pain.    Marland Kitchen albuterol (VENTOLIN HFA) 108 (90 Base) MCG/ACT inhaler INHALE 2 PUFFS INTO LUNGS EVERY 6 HOURS AS NEEDED  FOR WHEEZING OR SHORTNESS OF BREATH 18 g 12  . azelastine (ASTELIN) 137 MCG/SPRAY nasal spray Place 1-2 sprays into the nose 2 (two) times daily as needed for rhinitis or allergies.     . benzonatate (TESSALON) 200 MG capsule Take 1 capsule by mouth every 8 hours as needed for cough 20 capsule 1  . budesonide-formoterol (SYMBICORT) 160-4.5 MCG/ACT inhaler INHALE 2 PUFFS BY MOUTH TWICE DAILY 10.2 g 3  . Cholecalciferol (VITAMIN D-3) 5000 UNITS TABS Take 5,000 Units by mouth daily with breakfast.     . COLCRYS 0.6 MG tablet Take 0.6 mg by mouth daily. With breakfast    . Cyanocobalamin (VITAMIN B12 SL) Place 1,200 mcg under the tongue daily.     Marland Kitchen dextromethorphan (DELSYM) 30 MG/5ML liquid Take 30 mg by mouth at bedtime as needed for cough.     Marland Kitchen DHEA 50 MG TABS Take 50 mg by mouth daily with breakfast.     . docusate sodium (COLACE) 100 MG capsule Take 1 capsule (100 mg total) by mouth daily as needed. 30 capsule 2  . EPINEPHrine 0.3 mg/0.3 mL IJ SOAJ injection Inject 0.3 mLs (0.3 mg total) into the muscle as needed (for angioedema). 2 Device 0  . Ferrous Sulfate ([employer] SLOW RELEASE IRON PO) Take 45 mg by mouth 2 (two) times daily.    . finasteride (PROSCAR) 5 MG tablet Take 5 mg by mouth at bedtime.     . Hypromellose (ALZAIR ALLERGY NASAL SPRAY NA) Place 1 spray into both nostrils daily as needed (for allergies).     Marland Kitchen ipratropium (ATROVENT) 0.06 % nasal spray INSTILL 2 SPRAYS IN EACH NOSTRIL TID PRF RHINORRHEA  3  . levalbuterol (XOPENEX HFA) 45 MCG/ACT inhaler INHALE 2 PUFFS VIA SPACER Q 6 HOURS UNTIL SYMPTOMS IMPROVE.  1  . levothyroxine (SYNTHROID, LEVOTHROID) 112 MCG tablet Take 112 mcg by mouth daily before breakfast.     . liothyronine (CYTOMEL) 25 MCG tablet Take 25 mcg by mouth daily.     Marland Kitchen loratadine (CLARITIN) 10 MG tablet Take 10 mg by mouth daily.    . Menthol, Topical Analgesic, (BLUE-EMU MAXIMUM STRENGTH EX) Apply 1 application topically 4 (four) times daily as needed (for arthritis  pain.).    Marland Kitchen metoprolol tartrate (LOPRESSOR) 50 MG tablet TAKE 1 TABLET(50 MG) BY MOUTH TWICE DAILY 60 tablet 7  . Multiple Vitamins-Minerals (ONE-A-DAY MENS HEALTH FORMULA PO) Take 1 tablet by mouth daily with breakfast.     . omeprazole (PRILOSEC) 40 MG capsule Take 1 capsule (40 mg total) by mouth 2 (two) times daily. 180 capsule 1  . pseudoephedrine-guaifenesin (MUCINEX D) 60-600 MG 12 hr tablet Take 1 tablet by mouth every 12 (twelve) hours as needed for congestion.    Marland Kitchen  rivaroxaban (XARELTO) 20 MG TABS tablet Take 1 tablet (20 mg total) daily with supper by mouth. 90 tablet 3  . rizatriptan (MAXALT) 10 MG tablet Take 10 mg by mouth as needed for migraine. May repeat in 2 hours if needed    . Spacer/Aero-Holding Chambers (VALVED HOLDING CHAMBER) DEVI See admin instructions. use with inhaler  0  . tamsulosin (FLOMAX) 0.4 MG CAPS capsule Take 0.4 mg by mouth daily after supper.    . testosterone cypionate (DEPOTESTOTERONE CYPIONATE) 100 MG/ML injection Inject 200 mg into the muscle every Thursday. For IM use only     . tetrahydrozoline-zinc (VISINE-AC) 0.05-0.25 % ophthalmic solution Place 1 drop into both eyes as needed (dry eyes).    . venlafaxine (EFFEXOR-XR) 150 MG 24 hr capsule Take 150 mg by mouth daily with breakfast.     . losartan (COZAAR) 50 MG tablet Take 1 tablet (50 mg total) by mouth daily. 30 tablet 6   No current facility-administered medications for this visit.     Allergies  Allergen Reactions  . Amiodarone Swelling  . Codeine Itching  . Tramadol     dellusion     Review of Systems   Continues to regain strength No dizziness or syncope No edema No chest pain Concerned about his recurrent productive cough which is required both antibiotics and Depo-Medrol injections by his physicians.  BP 133/83 (BP Location: Right Arm, Patient Position: Sitting, Cuff Size: Large)   Pulse 68   Resp 16   Ht 5\' 10"  (1.778 m)   Wt 267 lb (121.1 kg)   SpO2 96% Comment: ON RA   BMI 38.31 kg/m  Physical Exam      Exam    General- alert and comfortable    Neck- no JVD, no cervical adenopathy palpable, no carotid bruit   Lungs- clear without rales, wheezes.  Sternal incision well-healed.          Without instability   Cor- regular rate and rhythm, no murmur , gallop   Abdomen- soft, non-tender   Extremities - warm, non-tender, minimal edema   Neuro- oriented, appropriate, no focal weakness   Diagnostic Tests: None  Impression: Blood pressure well controlled with addition of losartan 50 mg/day. Patient should have avoid recurrent use of Levaquin which can weaken the connective tissue of the aortic wall as well as skeletal joints Plan: Continue losartan.  Patient will check back with his pulmonologist regarding his recurrent cough.  I will see him back in 3 months for another blood pressure check.  We will plan on getting a CTA of his thoracic repair 2 years postop in September 2020   Len Childs, MD [facility] [phone]

## 2018-11-08 NOTE — Telephone Encounter (Signed)
LMTCB- when she calls back please make the appt with MW

## 2018-11-08 NOTE — Telephone Encounter (Signed)
Spoke with the pt's spouse  She states pt wanting to switch to Dr Melvyn Novas for eval of his cough  This was suggested to them by Dr Nils Pyle Please advise if okay, thanks

## 2018-11-08 NOTE — Telephone Encounter (Signed)
MW please advise if you are okay with this switch. Thanks.

## 2018-11-08 NOTE — Telephone Encounter (Signed)
Happy to see in consult slot

## 2018-11-08 NOTE — Telephone Encounter (Signed)
Fine with me

## 2018-11-09 NOTE — Telephone Encounter (Signed)
Called and spoke with Patients Wife, Juliann Pulse. Explained that it was ok with Dr. Annamaria Boots and Dr. Melvyn Novas to change provider.  OV scheduled for 11/15/18, at 3:45pm, with Dr. Melvyn Novas, for consult to eval cough.  Nothing further at this time.  Per MW- Happy to see in consult slot

## 2018-11-10 ENCOUNTER — Ambulatory Visit: Payer: Medicare Other | Admitting: Cardiothoracic Surgery

## 2018-11-15 ENCOUNTER — Encounter: Payer: Self-pay | Admitting: Internal Medicine

## 2018-11-15 ENCOUNTER — Ambulatory Visit: Payer: Medicare Other | Admitting: Internal Medicine

## 2018-11-15 VITALS — BP 110/70 | HR 62 | Ht 70.0 in | Wt 274.0 lb

## 2018-11-15 DIAGNOSIS — J45991 Cough variant asthma: Secondary | ICD-10-CM

## 2018-11-15 LAB — NITRIC OXIDE: Nitric Oxide: 28

## 2018-11-15 MED ORDER — METHYLPREDNISOLONE ACETATE 80 MG/ML IJ SUSP
120.0000 mg | Freq: Once | INTRAMUSCULAR | Status: AC
  Administered 2018-11-15: 120 mg via INTRAMUSCULAR

## 2018-11-15 MED ORDER — CEFDINIR 300 MG PO CAPS: 300.0000 mg | ORAL_CAPSULE | Freq: Two times a day (BID) | ORAL | 0 refills | Status: DC

## 2018-11-15 MED ORDER — BUDESONIDE-FORMOTEROL FUMARATE 80-4.5 MCG/ACT IN AERO: 2.0000 | INHALATION_SPRAY | Freq: Two times a day (BID) | RESPIRATORY_TRACT | 0 refills | Status: DC

## 2018-11-15 NOTE — Assessment & Plan Note (Addendum)
Onset of cough around 2005 became daily around 2015 - Spirometry 11/15/2018  FEV1 3.1 (89%)  Ratio 0.74 s curvature ? On advair - FENO 11/15/2018  =   7 - 11/15/2018  After extensive coaching inhaler device,  effectiveness =    75% with hfa s spacer - symb 80 2bid 11/15/2018 >>>     The most common causes of chronic cough in immunocompetent adults include the following: upper airway cough syndrome (UACS), previously referred to as postnasal drip syndrome (PNDS), which is caused by variety of rhinosinus conditions; (2) asthma; (3) GERD; (4) chronic bronchitis from cigarette smoking or other inhaled environmental irritants; (5) nonasthmatic eosinophilic bronchitis; and (6) bronchiectasis.   These conditions, singly or in combination, have accounted for up to 94% of the causes of chronic cough in prospective studies.   Other conditions have constituted no >6% of the causes in prospective studies These have included bronchogenic carcinoma, chronic interstitial pneumonia, sarcoidosis, left ventricular failure, ACEI-induced cough, and aspiration from a condition associated with pharyngeal dysfunction.    Chronic cough is often simultaneously caused by more than one condition. A single cause has been found from 38 to 82% of the time, multiple causes from 18 to 62%. Multiply caused cough has been the result of three diseases up to 42% of the time.       Mostly likely this is either /and cough variant asthma plus Upper airway cough syndrome (previously labeled PNDS),  is so named because it's frequently impossible to sort out how much is  CR/sinusitis with freq throat clearing (which can be related to primary GERD)   vs  causing  secondary (" extra esophageal")  GERD from wide swings in gastric pressure that occur with throat clearing, often  promoting self use of mint and menthol lozenges that reduce the lower esophageal sphincter tone and exacerbate the problem further in a cyclical fashion.   These are the  same pts (now being labeled as having "irritable larynx syndrome" by some cough centers) who not infrequently have a history of having failed to tolerate ace inhibitors,  dry powder inhalers or biphosphonates or report having atypical/extraesophageal reflux symptoms that don't respond to standard doses of PPI  and are easily confused as having aecopd or asthma flares by even experienced allergists/ pulmonologists (myself included).    Of the three most common causes of  Sub-acute / recurrent or chronic cough, only one (GERD)  can actually contribute to/ trigger  the other two (asthma and post nasal drip syndrome)  and perpetuate the cylce of cough.  While not intuitively obvious, many patients with chronic low grade reflux do not cough until there is a primary insult that disturbs the protective epithelial barrier and exposes sensitive nerve endings.   This is typically viral but can due to PNDS or ET Tube (which is part of the hx per records review) and  Any  may apply here.     >>>  The point is that once this occurs, it is difficult to eliminate the cycle  using anything but a maximally effective acid suppression regimen at least in the short run, accompanied by an appropriate diet to address non acid GERD and control / eliminate the cough itself for at least 3 days. Will try gerd rx/diet / bed blocks and a course of omnicef for any residual infection plus depomedrol 120 IM at his request   F/u in 2 weeks with all meds in hand using a trust but verify approach to  confirm accurate Medication  Reconciliation The principal here is that until we are certain that the  patients are doing what we've asked, it makes no sense to ask them to do more.     Advised:  The standardized cough guidelines published in Chest by Lissa Morales in 2006 are still the best available and consist of a multiple step process (up to 12!) , not a single office visit,  and are intended  to address this problem logically,  with  an alogrithm dependent on response to empiric treatment at  each progressive step  to determine a specific diagnosis with  minimal addtional testing needed. Therefore if adherence is an issue or can't be accurately verified,  it's very unlikely the standard evaluation and treatment will be successful here.    Furthermore, response to therapy (other than acute cough suppression, which should only be used short term with avoidance of narcotic containing cough syrups if possible), can be a gradual process for which the patient is not likely to  perceive immediate benefit.  Unlike going to an eye doctor where the best perscription is almost always the first one and is immediately effective, this is almost never the case in the management of chronic cough syndromes. Therefore the patient needs to commit up front to consistently adhere to recommendations  for up to 6 weeks of therapy directed at the likely underlying problem(s) before the response can be reasonably evaluated.     Total time devoted to counseling  > 50 % of initial 60 min office consultation :  review case with pt/wife discussion of options/alternatives/ personally creating written customized instructions  in presence of pt  then going over those specific  Instructions directly with the pt including how to use all of the meds but in particular covering each new medication in detail and the difference between the maintenance= "automatic" meds and the prns using an action plan format for the latter (If this problem/symptom => do that organization reading Left to right).  Please see AVS from this visit for a full list of these instructions which I personally wrote for this pt and  are unique to this visit.   See device teaching which extended face to face time for this visit

## 2018-11-15 NOTE — Progress Notes (Signed)
Grant [name], male    DOB: [birth year]      MRN: [id number]    Brief patient profile:  67 yowm quit smoking 1981 with no cough or sob at that point and then developed prolonged cough in winter while working in ? [place]y basement" at [employer] around 2005 and q winter since them same patter but became more of a chronic year round pattern around 2015  Some  worse  esp in winter usually better in summer mostly dry but more productive since summer of 2019 variably purulent.   Patient timeline listed below: 06/10/2018-[facility]-cough and congestion 06/25/2018-flu shot and Prevnar 07/22/2018- returned from beach with bad cough 07/26/2018- echo with [name] 08/11/2018-[facility]-cough with Depo shot 08/20/2018-[facility] 08/24/2018-[facility] cough and Depo shot 09/22/18 -[name]-Z-Pak and Depo-Medrol injection 12/30/2019[name]- Depo-Medrol and Levaquin      11/15/2018   1st office eval/Bobbie Virden re cough x late summer 2019  And turned brown just x few weeks  Chief Complaint  Patient presents with  . Pulmonary Consult    Former patient of [name]. Pt c/o cough since Sept 2019.  Cough is prod with brown sputum. He coughs the most in the morning when he wakes up. He occ wakes up in the night coughing.   assoc with sensation pnds/ best rx is depomedrol 120 mg IM  Advair rx now worse with advair and prev worse with symb 160 but hfa poor baseline zpak now Med list not accurate No increase chronic doe x  MMRC3 = can't walk 100 yards even at a slow pace at a flat grade s stopping due to sob     [name] Reflux v Neurogenic Cough Differentiator Reflux Comments  Do you awaken from a sound sleep coughing violently?                            With trouble breathing? Some nights Unless cough med    Do you have choking episodes when you cannot  Get enough air, gasping for air ?              Yes   Do you usually  cough when you lie down into  The bed, or when you just lie down to rest ?                          < 45 start coughing   Do you usually cough after meals or eating?         Big meal   Do you cough when (or after) you bend over?    No    GERD SCORE     [name] Reflux v Neurogenic Cough Differentiator Neurogenic   Do you more-or-less cough all day long? All day   Does change of temperature make you cough? No    Does laughing or chuckling cause you to cough? sometimes   Do fumes (perfume, automobile fumes, burned  Toast, etc.,) cause you to cough ?      no   Does speaking, singing, or talking on the phone cause you to cough   ?               Talking    Neurogenic/Airway score        No obvious other patterns in day to day or daytime variability or assoc  mucus plugs or hemoptysis or cp or chest tightness, subjective wheeze or overt   hb symptoms ( on ppi bid but not ac)     Also denies any obvious fluctuation of symptoms with weather or environmental changes or other aggravating or alleviating factors except as outlined above   No unusual exposure hx or h/o childhood pna/ asthma or knowledge of premature birth.  Current Allergies, Complete Past Medical History, Past Surgical History, Family History, and Social History were reviewed in [employer] record.  ROS  The following are not active complaints unless bolded Hoarseness, sore throat, dysphagia, dental problems, itching, sneezing,  nasal congestion or discharge of excess mucus or purulent secretions, ear ache,   fever, chills, sweats, unintended wt loss or wt gain, classically pleuritic or exertional cp,  orthopnea pnd or arm/hand swelling  or leg swelling, presyncope, palpitations, abdominal pain, anorexia, nausea, vomiting, diarrhea  or change in bowel habits or change in bladder habits, change in stools or change in urine, dysuria, hematuria,  rash, arthralgias, visual complaints, headache, numbness, weakness or  ataxia or problems with walking or coordination,  change in mood or  memory.              Past Medical History:  Diagnosis Date  . Allergic rhinitis   . Anxiety   . Arthritis   . Asthma   . Bladder tumor   . Chronic fatigue   . Chronic fatigue   . Depression    06/30/17 Pt denies being depressed, reports Effexor is taken for Chronic Fatigue   . Dyspnea   . Enlarged prostate   . Fibromyalgia   . GERD (gastroesophageal reflux disease)   . Headache   . History of chronic bronchitis   . History of migraine   . History of toxic encephalopathy   . Hypothyroidism   . OSA on CPAP    CPAP 14  . PAF (paroxysmal atrial fibrillation) ([place]) CARDIOLOGIST -- DR [name]   DX OCT 2013  . 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 Unspecified essential hypertension   . Urethral tumor    PROSTATIC    Outpatient Medications Prior to Visit - - NOTE:   Unable to verify as accurately reflecting what pt takes     Medication Sig Dispense Refill  . acetaminophen (TYLENOL) 500 MG tablet Take 500 mg by mouth every 6 (six) hours as needed for moderate pain.    Marland Kitchen albuterol (VENTOLIN HFA) 108 (90 Base) MCG/ACT inhaler INHALE 2 PUFFS INTO LUNGS EVERY 6 HOURS AS NEEDED FOR WHEEZING OR SHORTNESS OF BREATH 18 g 12  . azelastine (ASTELIN) 137 MCG/SPRAY nasal spray Place 1-2 sprays into the nose 2 (two) times daily as needed for rhinitis or allergies.     . benzonatate (TESSALON) 200 MG capsule Take 1 capsule by mouth every 8 hours as needed for cough 20 capsule 1  . budesonide-formoterol (SYMBICORT) 160-4.5 MCG/ACT inhaler INHALE 2 PUFFS BY MOUTH TWICE DAILY 10.2 g 3  . Cholecalciferol (VITAMIN D-3) 5000 UNITS TABS Take 5,000 Units by mouth daily with breakfast.     . COLCRYS 0.6 MG tablet Take 0.6 mg by mouth daily. With breakfast    . Cyanocobalamin (VITAMIN B12 SL) Place 1,200 mcg under the tongue daily.     Marland Kitchen dextromethorphan (DELSYM) 30 MG/5ML liquid Take 30 mg by mouth at bedtime as needed for cough.     Marland Kitchen DHEA 50  MG TABS Take 50 mg by mouth daily with breakfast.     .  docusate sodium (COLACE) 100 MG capsule Take 1 capsule (100 mg total) by mouth daily as needed. 30 capsule 2  . EPINEPHrine 0.3 mg/0.3 mL IJ SOAJ injection Inject 0.3 mLs (0.3 mg total) into the muscle as needed (for angioedema). 2 Device 0  . Ferrous Sulfate (CVS SLOW RELEASE IRON PO) Take 45 mg by mouth 2 (two) times daily.    . finasteride (PROSCAR) 5 MG tablet Take 5 mg by mouth at bedtime.     . Hypromellose (ALZAIR ALLERGY NASAL SPRAY NA) Place 1 spray into both nostrils daily as needed (for allergies).     Marland Kitchen ipratropium (ATROVENT) 0.06 % nasal spray INSTILL 2 SPRAYS IN EACH NOSTRIL TID PRF RHINORRHEA  3  . levalbuterol (XOPENEX HFA) 45 MCG/ACT inhaler INHALE 2 PUFFS VIA SPACER Q 6 HOURS UNTIL SYMPTOMS IMPROVE.  1  . levothyroxine (SYNTHROID, LEVOTHROID) 112 MCG tablet Take 112 mcg by mouth daily before breakfast.     . liothyronine (CYTOMEL) 25 MCG tablet Take 25 mcg by mouth daily.     Marland Kitchen loratadine (CLARITIN) 10 MG tablet Take 10 mg by mouth daily.    Marland Kitchen losartan (COZAAR) 50 MG tablet Take 1 tablet (50 mg total) by mouth daily. 30 tablet 6  . Menthol, Topical Analgesic, (BLUE-EMU MAXIMUM STRENGTH EX) Apply 1 application topically 4 (four) times daily as needed (for arthritis pain.).    Marland Kitchen metoprolol tartrate (LOPRESSOR) 50 MG tablet TAKE 1 TABLET(50 MG) BY MOUTH TWICE DAILY 60 tablet 7  . Multiple Vitamins-Minerals (ONE-A-DAY MENS HEALTH FORMULA PO) Take 1 tablet by mouth daily with breakfast.     . omeprazole (PRILOSEC) 40 MG capsule Take 1 capsule (40 mg total) by mouth 2 (two) times daily. 180 capsule 1  . pseudoephedrine-guaifenesin (MUCINEX D) 60-600 MG 12 hr tablet Take 1 tablet by mouth every 12 (twelve) hours as needed for congestion.    . rivaroxaban (XARELTO) 20 MG TABS tablet Take 1 tablet (20 mg total) daily with supper by mouth. 90 tablet 3  . rizatriptan (MAXALT) 10 MG tablet Take 10 mg by mouth as needed for migraine. May  repeat in 2 hours if needed    . Spacer/Aero-Holding Chambers (VALVED HOLDING CHAMBER) DEVI See admin instructions. use with inhaler  0  . tamsulosin (FLOMAX) 0.4 MG CAPS capsule Take 0.4 mg by mouth daily after supper.    . testosterone cypionate (DEPOTESTOTERONE CYPIONATE) 100 MG/ML injection Inject 200 mg into the muscle every Thursday. For IM use only     . tetrahydrozoline-zinc (VISINE-AC) 0.05-0.25 % ophthalmic solution Place 1 drop into both eyes as needed (dry eyes).    . venlafaxine (EFFEXOR-XR) 150 MG 24 hr capsule Take 150 mg by mouth daily with breakfast.         Objective:     BP 110/70 (BP Location: Left Arm, Cuff Size: Normal)   Pulse 62   Ht 5\' 10"  (1.778 m)   Wt 274 lb (124.3 kg)   SpO2 99%   BMI 39.31 kg/m   SpO2: 99 %  RA  Cough on insp/ tight abd   obese wm harsh min productive cough on insp  HEENT: nl dentition, turbinates bilaterally, and oropharynx. Nl external ear canals without cough reflex   NECK :  without JVD/Nodes/TM/ nl carotid upstrokes bilaterally   LUNGS: no acc muscle use,  Nl contour chest which is clear to A and P bilaterally without cough on insp or exp maneuvers   CV:  RRR  no s3 or murmur  or increase in P2, and no edema   ABD:  Tensely obese   nontender with limited  inspiratory excursion in the supine position. No bruits or organomegaly appreciated, bowel sounds nl  MS:  Nl gait/ ext warm without deformities, calf tenderness, cyanosis or clubbing No obvious joint restrictions   SKIN: warm and dry without lesions    NEURO:  alert, approp, nl sensorium with  no motor or cerebellar deficits apparent.      I personally reviewed images and agree with radiology impression as follows:  CXR:   09/02/18  wnl Sinust CT mucus retention cysts sphenoid o/w clear        Assessment   Cough variant asthma Onset of cough around 2005 became daily around 2015 - Spirometry 11/15/2018  FEV1 3.1 (89%)  Ratio 0.74 s curvature ? On advair -  FENO 11/15/2018  =   7 - 11/15/2018  After extensive coaching inhaler device,  effectiveness =    75% with hfa s spacer - symb 80 2bid 11/15/2018 >>>     The most common causes of chronic cough in immunocompetent adults include the following: upper airway cough syndrome (UACS), previously referred to as postnasal drip syndrome (PNDS), which is caused by variety of rhinosinus conditions; (2) asthma; (3) GERD; (4) chronic bronchitis from cigarette smoking or other inhaled environmental irritants; (5) nonasthmatic eosinophilic bronchitis; and (6) bronchiectasis.   These conditions, singly or in combination, have accounted for up to 94% of the causes of chronic cough in prospective studies.   Other conditions have constituted no >6% of the causes in prospective studies These have included bronchogenic carcinoma, chronic interstitial pneumonia, sarcoidosis, left ventricular failure, ACEI-induced cough, and aspiration from a condition associated with pharyngeal dysfunction.    Chronic cough is often simultaneously caused by more than one condition. A single cause has been found from 38 to 82% of the time, multiple causes from 18 to 62%. Multiply caused cough has been the result of three diseases up to 42% of the time.       Mostly likely this is either /and cough variant asthma plus Upper airway cough syndrome (previously labeled PNDS),  is so named because it's frequently impossible to sort out how much is  CR/sinusitis with freq throat clearing (which can be related to primary GERD)   vs  causing  secondary (" extra esophageal")  GERD from wide swings in gastric pressure that occur with throat clearing, often  promoting self use of mint and menthol lozenges that reduce the lower esophageal sphincter tone and exacerbate the problem further in a cyclical fashion.   These are the same pts (now being labeled as having "irritable larynx syndrome" by some cough centers) who not infrequently have a history of  having failed to tolerate ace inhibitors,  dry powder inhalers or biphosphonates or report having atypical/extraesophageal reflux symptoms that don't respond to standard doses of PPI  and are easily confused as having aecopd or asthma flares by even experienced allergists/ pulmonologists (myself included).    Of the three most common causes of  Sub-acute / recurrent or chronic cough, only one (GERD)  can actually contribute to/ trigger  the other two (asthma and post nasal drip syndrome)  and perpetuate the cylce of cough.  While not intuitively obvious, many patients with chronic low grade reflux do not cough until there is a primary insult that disturbs the protective epithelial barrier and exposes sensitive nerve endings.   This is typically viral but can  due to PNDS or ET Tube (which is part of the hx per records review) and  Any  may apply here.     >>>  The point is that once this occurs, it is difficult to eliminate the cycle  using anything but a maximally effective acid suppression regimen at least in the short run, accompanied by an appropriate diet to address non acid GERD and control / eliminate the cough itself for at least 3 days. Will try gerd rx/diet / bed blocks and a course of omnicef for any residual infection plus depomedrol 120 IM at his request   F/u in 2 weeks with all meds in hand using a trust but verify approach to confirm accurate Medication  Reconciliation The principal here is that until we are certain that the  patients are doing what we've asked, it makes no sense to ask them to do more.     Advised:  The standardized cough guidelines published in Chest by Lissa Morales in 2006 are still the best available and consist of a multiple step process (up to 12!) , not a single office visit,  and are intended  to address this problem logically,  with an alogrithm dependent on response to empiric treatment at  each progressive step  to determine a specific diagnosis with   minimal addtional testing needed. Therefore if adherence is an issue or can't be accurately verified,  it's very unlikely the standard evaluation and treatment will be successful here.    Furthermore, response to therapy (other than acute cough suppression, which should only be used short term with avoidance of narcotic containing cough syrups if possible), can be a gradual process for which the patient is not likely to  perceive immediate benefit.  Unlike going to an eye doctor where the best perscription is almost always the first one and is immediately effective, this is almost never the case in the management of chronic cough syndromes. Therefore the patient needs to commit up front to consistently adhere to recommendations  for up to 6 weeks of therapy directed at the likely underlying problem(s) before the response can be reasonably evaluated.       Total time devoted to counseling  > 50 % of initial 60 min office consultation :  review case with pt/wife discussion of options/alternatives/ personally creating written customized instructions  in presence of pt  then going over those specific  Instructions directly with the pt including how to use all of the meds but in particular covering each new medication in detail and the difference between the maintenance= "automatic" meds and the prns using an action plan format for the latter (If this problem/symptom => do that organization reading Left to right).  Please see AVS from this visit for a full list of these instructions which I personally wrote for this pt and  are unique to this visit.   See device teaching which extended face to face time for this visit          Christinia Gully, MD 11/15/2018

## 2018-11-15 NOTE — Patient Instructions (Addendum)
If the mucus doesn't turn clear >>> omnicef 300 mg twice daily x 10 days  For cough / congestion >  mucinex dm 1200 mg every 12 hours supplement with tessilon and no delsym   Depomedrol 120 mg IM    Symbicort 80 Take 2 puffs first thing in am and then another 2 puffs about 12 hours later.     Only use your levoalbuterol as a rescue medication to be used if you can't catch your breath by resting or doing a relaxed purse lip breathing pattern.  - The less you use it, the better it will work when you need it. - Ok to use up to 2 puffs  every 4 hours if you must but call for immediate appointment if use goes up over your usual need - Don't leave home without it !!  (think of it like the spare tire for your car)     Please schedule a follow up office visit in 2 weeks, call sooner if needed with all medications /inhalers/ solutions in hand so we can verify exactly what you are taking. This includes all medications from all doctors and over the [place] separate them into two bags:  the ones you take automatically, no matter what, vs the ones you take just when you feel you need them "BAG #2 is UP TO YOU"  - this will really help [place] help you take your medications more effectively.

## 2018-11-16 ENCOUNTER — Encounter: Payer: Self-pay | Admitting: Internal Medicine

## 2018-11-16 DIAGNOSIS — M6208 Separation of muscle (nontraumatic), other site: Secondary | ICD-10-CM | POA: Diagnosis not present

## 2018-11-16 DIAGNOSIS — L7682 Other postprocedural complications of skin and subcutaneous tissue: Secondary | ICD-10-CM | POA: Diagnosis not present

## 2018-11-18 ENCOUNTER — Other Ambulatory Visit: Payer: Self-pay | Admitting: Cardiovascular Disease

## 2018-11-22 NOTE — Progress Notes (Signed)
Final report of fungal respiratory culture shows small amount of yeast.    Nystatin 500,000 units suspension /100,000 units/mL >>>5 mL's every 6 hours for 7 days >>>Try to retain nystatin in mouth as long as possible, then swallow  Please place the order.   Keep follow up with our office.   Wyn Quaker FNP

## 2018-11-24 ENCOUNTER — Other Ambulatory Visit: Payer: Self-pay | Admitting: Pulmonary Disease

## 2018-11-24 MED ORDER — NYSTATIN 100000 UNIT/ML MT SUSP: 5.0000 mL | Freq: Four times a day (QID) | OROMUCOSAL | 0 refills | Status: DC

## 2018-11-29 ENCOUNTER — Encounter: Payer: Self-pay | Admitting: Internal Medicine

## 2018-11-29 ENCOUNTER — Ambulatory Visit: Payer: Medicare Other | Admitting: Internal Medicine

## 2018-11-29 DIAGNOSIS — J45991 Cough variant asthma: Secondary | ICD-10-CM | POA: Diagnosis not present

## 2018-11-29 NOTE — Patient Instructions (Addendum)
Delsym two tsp twice daily automatically   Symbicort 160 Take 2 puffs first thing in am and then another 2 puffs about 12 hours later.   Only use your albuterol and levoalbuterol as a rescue medication to be used if you can't catch your breath by resting or doing a relaxed purse lip breathing pattern.  - The less you use it, the better it will work when you need it. - Ok to use up to 2 puffs  every 4 hours if you must but call for immediate appointment if use goes up over your usual need - Don't leave home without it !!  (think of it like the spare tire for your car)    Omeprazole 40 mg Take 30- 60 min before your first and last meals of the day   GERD (REFLUX)  is an extremely common cause of respiratory symptoms just like yours , many times with no obvious heartburn at all.    It can be treated with medication, but also with lifestyle changes including elevation of the head of your bed (ideally with 6 -8inch blocks under the headboard of your bed),  Smoking cessation, avoidance of late meals, excessive alcohol, and avoid fatty foods, chocolate, peppermint, colas, red wine, and acidic juices such as orange juice.  NO MINT OR MENTHOL PRODUCTS SO NO COUGH DROPS  USE SUGARLESS CANDY INSTEAD (Jolley ranchers or Stover's or Life Savers) or even ice chips will also do - the key is to swallow to prevent all throat clearing. NO OIL BASED VITAMINS - use powdered substitutes.  Avoid fish oil when coughing.    See [name] w/in 2 weeks with all your medications, even over the counter meds, separated in two separate bags, the ones you take no matter what vs the ones you stop once you feel better and take only as needed when you feel you need them.   [name]  will generate for you a new user friendly medication calendar that will put [place] all on the same page re: your medication use.     Without this process, it simply isn't possible to assure that we are providing  your outpatient care  with  the attention  to detail we feel you deserve.   If we cannot assure that you're getting that kind of care,  then we cannot manage your problem effectively from this clinic.  Once you have seen [name] and we are sure that we're all on the same page with your medication use she will arrange follow up with me.  - late: add add gabapentin 100 tid if still throat cleairng on delsym 2 tsp bid and tessalon prn

## 2018-11-29 NOTE — Progress Notes (Signed)
Grant [name], male    DOB: 12-21-1950      MRN: [id number]    Brief patient profile:  48 yowm quit smoking 1981 with no cough or sob at that point and then developed prolonged cough in winter while working in ? [name] basement" at [facility] around 2005 and q winter since them same pattern but became more of a chronic year round pattern around 2015  Some  worse  esp in winter usually better in summer mostly dry but more productive since summer of 2019 variably purulent.   Patient timeline listed below: 06/10/2018-[facility]-cough and congestion 06/25/2018-flu shot and Prevnar 07/22/2018- returned from beach with bad cough 07/26/2018- echo with [name] 08/11/2018-[facility]-cough with Depo shot 08/20/2018-[facility] 08/24/2018-[facility] cough and Depo shot 09/22/18 -[facility]-Z-Pak and Depo-Medrol injection 12/30/2019[name]- Depo-Medrol and Levaquin      11/15/2018   1st office eval/Miachel Nardelli re cough x late summer 2019  And turned brown just x few weeks  Chief Complaint  Patient presents with  . Pulmonary Consult    Former patient of [name]. Pt c/o cough since Sept 2019.  Cough is prod with brown sputum. He coughs the most in the morning when he wakes up. He occ wakes up in the night coughing.   assoc with sensation pnds/ best rx is depomedrol 120 mg IM  Advair rx now worse with advair and prev worse with symb 160 but hfa poor baseline zpak now Med list not accurate No increase chronic doe x  MMRC3 = can't walk 100 yards even at a slow pace at a flat grade s stopping due to sob     [name] Reflux v Neurogenic Cough Differentiator Reflux Comments  Do you awaken from a sound sleep coughing violently?                            With trouble breathing? Some nights Unless cough med    Do you have choking episodes when you cannot  Get enough air, gasping for air ?              Yes   Do you usually  cough when you lie down into  The bed, or when you just lie down to rest ?                          < 45 start coughing   Do you usually cough after meals or eating?         Big meal   Do you cough when (or after) you bend over?    No    GERD SCORE     [name] Reflux v Neurogenic Cough Differentiator Neurogenic   Do you more-or-less cough all day long? All day   Does change of temperature make you cough? No    Does laughing or chuckling cause you to cough? sometimes   Do fumes (perfume, automobile fumes, burned  Toast, etc.,) cause you to cough ?      no   Does speaking, singing, or talking on the phone cause you to cough   ?               Talking    Neurogenic/Airway score        No obvious other patterns in day to day or daytime variability or assoc  mucus plugs or hemoptysis or cp or chest tightness, subjective wheeze or overt   hb symptoms ( on ppi bid but not ac)     rec If the mucus doesn't turn clear >>> omnicef 300 mg twice daily x 10 days For cough / congestion >  mucinex dm 1200 mg every 12 hours supplement with tessilon and no delsym  Depomedrol 120 mg IM  Symbicort 80 Take 2 puffs first thing in am and then another 2 puffs about 12 hours later.  Only use your levoalbuterol as a rescue medication  Please schedule a follow up office visit in 2 weeks, call sooner if needed with all medications /inhalers/ solutions in hand    11/29/2018  f/u ov/Grant [name] re: chronic throat clearing x decades/  cough worse since summer 2019 / confused with meds  Chief Complaint  Patient presents with  . Follow-up    Cough has improved slightly. He uses his xopenex and ventolin every few days.   Dyspnea: denies now but very sedentary  Cough: min improved  Sleeping: no bed blocks  SABA use: using maint alb and xopenex  02: none / cpap does  fine per [name]    No obvious day to day or daytime variability or assoc excess/ purulent sputum or mucus plugs or hemoptysis or cp or chest tightness,  subjective wheeze or overt sinus or hb symptoms.   Sleeping as above  without nocturnal  or early am exacerbation  of respiratory  c/o's or need for noct saba. Also denies any obvious fluctuation of symptoms with weather or environmental changes or other aggravating or alleviating factors except as outlined above   No unusual exposure hx or h/o childhood pna/ asthma or knowledge of premature birth.  Current Allergies, Complete Past Medical History, Past Surgical History, Family History, and Social History were reviewed in [employer] record.  ROS  The following are not active complaints unless bolded Hoarseness, sore throat, dysphagia, dental problems, itching, sneezing,  nasal congestion or discharge of excess mucus or purulent secretions, ear ache,   fever, chills, sweats, unintended wt loss or wt gain, classically pleuritic or exertional cp,  orthopnea pnd or arm/hand swelling  or leg swelling, presyncope, palpitations, abdominal pain, anorexia, nausea, vomiting, diarrhea  or change in bowel habits or change in bladder habits, change in stools or change in urine, dysuria, hematuria,  rash, arthralgias, visual complaints, headache, numbness, weakness or ataxia or problems with walking or coordination,  change in mood or  memory.        Current Meds  Medication Sig  . acetaminophen (TYLENOL) 500 MG tablet Take 500 mg by mouth every 6 (six) hours as needed for moderate pain.  Marland Kitchen albuterol (VENTOLIN HFA) 108 (90 Base) MCG/ACT inhaler INHALE 2 PUFFS INTO LUNGS EVERY 6 HOURS AS NEEDED FOR WHEEZING OR SHORTNESS OF BREATH  . azelastine (ASTELIN) 137 MCG/SPRAY nasal spray Place 1-2 sprays into the nose 2 (two) times daily as needed for rhinitis or allergies.   . benzonatate (TESSALON) 200 MG capsule Take 1 capsule by mouth every 8 hours as needed for cough  . budesonide-formoterol (SYMBICORT) 160-4.5 MCG/ACT inhaler Inhale 2 puffs into the lungs 2 (two) times daily.  . cetirizine  (ZYRTEC) 10 MG tablet Take 10 mg by mouth daily.  Marland Kitchen COLCRYS 0.6 MG tablet Take 0.6 mg by mouth daily. With breakfast  . dextromethorphan (DELSYM) 30 MG/5ML liquid Take by mouth as needed for cough.  . Dextromethorphan-guaiFENesin (Elmo DM MAXIMUM STRENGTH)  60-1200 MG TB12 Take 1 tablet by mouth 2 (two) times daily as needed.  Marland Kitchen DHEA 50 MG TABS Take 50 mg by mouth daily with breakfast.   . EPINEPHrine 0.3 mg/0.3 mL IJ SOAJ injection Inject 0.3 mLs (0.3 mg total) into the muscle as needed (for angioedema).  . famotidine (PEPCID) 20 MG tablet Take 20 mg by mouth daily as needed for heartburn or indigestion.  . Ferrous Sulfate (CVS SLOW RELEASE IRON PO) Take 45 mg by mouth 2 (two) times daily.  . finasteride (PROSCAR) 5 MG tablet Take 5 mg by mouth at bedtime.   Marland Kitchen ipratropium (ATROVENT) 0.06 % nasal spray INSTILL 2 SPRAYS IN EACH NOSTRIL TID PRF RHINORRHEA  . levalbuterol (XOPENEX HFA) 45 MCG/ACT inhaler INHALE 2 PUFFS VIA SPACER Q 6 HOURS UNTIL SYMPTOMS IMPROVE.  Marland Kitchen levothyroxine (SYNTHROID, LEVOTHROID) 112 MCG tablet Take 112 mcg by mouth daily before breakfast.   . liothyronine (CYTOMEL) 25 MCG tablet Take 25 mcg by mouth daily.   Marland Kitchen losartan (COZAAR) 50 MG tablet Take 1 tablet (50 mg total) by mouth daily.  . metoprolol tartrate (LOPRESSOR) 50 MG tablet TAKE 1 TABLET(50 MG) BY MOUTH TWICE DAILY  . Multiple Vitamins-Minerals (ONE-A-DAY MENS HEALTH FORMULA PO) Take 1 tablet by mouth daily with breakfast.   . omeprazole (PRILOSEC) 40 MG capsule Take 1 capsule (40 mg total) by mouth 2 (two) times daily.  . rizatriptan (MAXALT) 10 MG tablet Take 10 mg by mouth as needed for migraine. May repeat in 2 hours if needed  . tamsulosin (FLOMAX) 0.4 MG CAPS capsule Take 0.4 mg by mouth daily after supper.  . testosterone cypionate (DEPOTESTOTERONE CYPIONATE) 100 MG/ML injection Inject 200 mg into the muscle every Thursday. For IM use only   . venlafaxine (EFFEXOR-XR) 150 MG 24 hr capsule Take 150 mg by mouth  daily with breakfast.   . XARELTO 20 MG TABS tablet TAKE 1 TABLET(20 MG) BY MOUTH DAILY WITH SUPPER           Objective:     amb obese wm nad with freq throat clearing (pt deo  Vital signs reviewed - Note on arrival 02 sats  96% on RA     Wt Readings from Last 3 Encounters:  11/29/18 272 lb (123.4 kg)  11/15/18 274 lb (124.3 kg)  11/08/18 267 lb (121.1 kg)      HEENT: nl dentition, turbinates bilaterally, and oropharynx. Nl external ear canals without cough reflex   NECK :  without JVD/Nodes/TM/ nl carotid upstrokes bilaterally   LUNGS: no acc muscle use,  Nl contour chest which is clear to A and P bilaterally without cough now on insp or exp maneuvers   CV:  RRR  no s3 or murmur or increase in P2, and no edema   ABD: tensely obese  nontender with nl inspiratory excursion in the supine position. No bruits or organomegaly appreciated, bowel sounds nl  MS:  Nl gait/ ext warm without deformities, calf tenderness, cyanosis or clubbing No obvious joint restrictions   SKIN: warm and dry without lesions    NEURO:  alert, approp, nl sensorium with  no motor or cerebellar deficits apparent.         I personally reviewed images 11/29/2018  CXR:   09/02/18  wnl Sinus CT mucus retention cysts sphenoid o/w clear        Assessment

## 2018-11-30 ENCOUNTER — Encounter: Payer: Self-pay | Admitting: Internal Medicine

## 2018-11-30 NOTE — Assessment & Plan Note (Signed)
Onset of cough around ? 2005 (throat clearing x decades) became daily around 2015 - Spirometry 11/15/2018  FEV1 3.1 (89%)  Ratio 0.74 s curvature ? On advair - FENO 11/15/2018  =   7   - symb 80 2bid 11/15/2018 > ? Helped but pt returned on symb 160 plus maint saba x 2 different versions so rec continue symb 160, change saba to prn and return for med calendar/pill organizer    - The proper method of use, as well as anticipated side effects, of a metered-dose inhaler are discussed and demonstrated to the patient. Improved effectiveness after extensive coaching during this visit to a level of approximately 75 % from a baseline of 50 %   Also needs to continue max rx for gerd with next step consideration for gabapentin once we're sure we have 100% med reconciliation.   To keep things simple, I have asked the patient to first separate medicines that are perceived as maintenance, that is to be taken daily "no matter what", from those medicines that are taken on only on an as-needed basis and I have given the patient examples of both, and then return to see our NP to generate a  detailed  medication calendar which should be followed until the next physician sees the patient and updates it.

## 2018-12-02 ENCOUNTER — Telehealth: Payer: Self-pay | Admitting: Internal Medicine

## 2018-12-02 NOTE — Telephone Encounter (Signed)
See [name] w/in 2 weeks with all your medications, even over the counter meds, separated in two separate bags, the ones you take no matter what vs the ones you stop once you feel better and take only as needed when you feel you need them.   [name]  will generate for you a new user friendly medication calendar that will put [place] all on the same page re: your medication use.     Without this process, it simply isn't possible to assure that we are providing  your outpatient care  with  the attention to detail we feel you deserve.   If we cannot assure that you're getting that kind of care,  then we cannot manage your problem effectively from this clinic.  Once you have seen [name] and we are sure that we're all on the same page with your medication use she will arrange follow up with me.  - late: add add gabapentin 100 tid if still throat cleairng on delsym 2 tsp bid and tessalon prn ________________________________________________________________________________________  Grant [name] patients wife (ok per DPR) unable to reach left message to give [place] a call back

## 2018-12-02 NOTE — Telephone Encounter (Signed)
Patient's spouse returned call Med Cal appt scheduled with TP for 2.27.2020 Spouse is aware that pt MUST be present for appt and that med cal's cannot be done over the phone Nothing further needed; will sign off

## 2018-12-14 LAB — FUNGUS CULTURE W SMEAR
MICRO NUMBER:: 33453
SMEAR:: NONE SEEN
SPECIMEN QUALITY:: ADEQUATE

## 2018-12-14 LAB — RESPIRATORY CULTURE OR RESPIRATORY AND SPUTUM CULTURE
MICRO NUMBER:: 33455
RESULT:: NORMAL
SPECIMEN QUALITY:: ADEQUATE

## 2018-12-14 LAB — MYCOBACTERIA,CULT W/FLUOROCHROME SMEAR
MICRO NUMBER:: 33454
SMEAR:: NONE SEEN
SPECIMEN QUALITY:: ADEQUATE

## 2018-12-16 ENCOUNTER — Encounter: Payer: Self-pay | Admitting: Adult Health

## 2018-12-16 ENCOUNTER — Ambulatory Visit: Payer: Medicare Other | Admitting: Adult Health

## 2018-12-16 DIAGNOSIS — J45909 Unspecified asthma, uncomplicated: Secondary | ICD-10-CM

## 2018-12-16 DIAGNOSIS — J3089 Other allergic rhinitis: Secondary | ICD-10-CM

## 2018-12-16 DIAGNOSIS — J302 Other seasonal allergic rhinitis: Secondary | ICD-10-CM

## 2018-12-16 NOTE — Patient Instructions (Signed)
Continue on current regimen  Follow med calendar closely and bring to each visit  Follow up with Dr. Wert  In 3 months and As needed    

## 2018-12-16 NOTE — Assessment & Plan Note (Signed)
Recent flare with chronic cough -improved on current regimen  Patient's medications were reviewed today and patient education was given. Computerized medication calendar was adjusted/completed   Plan  Patient Instructions  Continue on current regimen  Follow med calendar closely and bring to each visit.  Follow up with Dr. Melvyn Novas  In 3 months and As needed

## 2018-12-16 NOTE — Assessment & Plan Note (Signed)
Continue with trigger prevention   Plan  Patient Instructions  Continue on current regimen  Follow med calendar closely and bring to each visit.  Follow up with Dr. Melvyn Novas  In 3 months and As needed

## 2018-12-16 NOTE — Progress Notes (Signed)
@Patient  ID: Grant [name], male    DOB: Sep 03, 1951, 68 y.o.   MRN: [id number]  Chief Complaint  Patient presents with  . Follow-up    asthma     Referring provider: Shirline Frees, MD  HPI: 68 year old male former smoker followed for asthma and chronic bronchitis, allergic rhinitis, obstructive sleep apnea Past medical history significant for A. fib on chronic anticoagulation, Depression , angioedema, aortic valve replacement, toxic encephalopathy, light sensitivity, chronic fatigue/fibromyalgia  TEST  PFT 06/01/17-WNL-FVC 4.46/96%, FEV1 3.71/106%, ratio 0.83, FEF 25-75%4.64/167%, no response to dilator, TLC 107%, DLCO 101%   NPSG 03/24/03- AHI 88/ hr, desaturation to 75%, body weight 244 lbs  12/16/2018 Follow up : Asthma , Med Review  Patient returns for a 2-week follow-up.  Patient was seen last visit with slow to resolve upper airway cough.  He was recommend to use Delsym and Tessalon Perles.  He is on Symbicort twice daily.  Patient says since last visit he feels that his cough is much improved.  He has occasional wheezing.  But seems to be much improved. He denies any fever, chest pain, orthopnea.  Occasionally has some darker mucus.  But this is also been improved  Patient brought all of his medications in today.  We reviewed them in detail.  Patient education was provided.  We organize them into a medication calendar.  Patient is on greater than 30 medications.       Allergies  Allergen Reactions  . Amiodarone Swelling  . Codeine Itching  . Tramadol     dellusion     Immunization History  Administered Date(s) Administered  . Influenza, High Dose Seasonal PF 06/28/2018  . Influenza,inj,Quad PF,6+ Mos 10/31/2016, 07/12/2017  . Pneumococcal Polysaccharide-23 07/12/2017    Past Medical History:  Diagnosis Date  . Allergic rhinitis   . Anxiety   . Arthritis   . Asthma   . Bladder tumor   . Chronic fatigue   . Chronic fatigue   . Depression    06/30/17  Pt denies being depressed, reports Effexor is taken for Chronic Fatigue   . Dyspnea   . Enlarged prostate   . Fibromyalgia   . GERD (gastroesophageal reflux disease)   . Headache   . History of chronic bronchitis   . History of migraine   . History of toxic encephalopathy   . Hypothyroidism   . OSA on CPAP    CPAP 14  . PAF (paroxysmal atrial fibrillation) ([place]) CARDIOLOGIST -- DR [name]   DX OCT 2013  . 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 Unspecified essential hypertension   . Urethral tumor    PROSTATIC    Tobacco History: Social History   Tobacco Use  Smoking Status Former Smoker  . Packs/day: 0.50  . Years: 27.00  . Pack years: 13.50  . Types: Cigarettes  . Last attempt to quit: 10/21/1979  . Years since quitting: 39.1  Smokeless Tobacco Never Used   Counseling given: Not Answered   Outpatient Medications Prior to Visit  Medication Sig Dispense Refill  . acetaminophen (TYLENOL) 500 MG tablet Take 500 mg by mouth every 6 (six) hours as needed for moderate pain.    Marland Kitchen albuterol (VENTOLIN HFA) 108 (90 Base) MCG/ACT inhaler INHALE 2 PUFFS INTO LUNGS EVERY 6 HOURS AS NEEDED FOR WHEEZING OR SHORTNESS OF BREATH 18 g 12  . azelastine (ASTELIN) 137 MCG/SPRAY nasal spray Place 1-2 sprays into the nose 2 (two) times daily as needed for rhinitis or allergies.     Marland Kitchen  benzonatate (TESSALON) 200 MG capsule Take 1 capsule by mouth every 8 hours as needed for cough 20 capsule 1  . budesonide-formoterol (SYMBICORT) 160-4.5 MCG/ACT inhaler Inhale 2 puffs into the lungs 2 (two) times daily.    . cetirizine (ZYRTEC) 10 MG tablet Take 10 mg by mouth daily.    Marland Kitchen COLCRYS 0.6 MG tablet Take 0.6 mg by mouth daily. With breakfast    . Cyanocobalamin (VITAMIN B12 SL) Place 1,200 mcg under the tongue daily.     Marland Kitchen dextromethorphan (DELSYM) 30 MG/5ML liquid Take by mouth as needed for cough.    . Dextromethorphan-guaiFENesin (MUCINEX DM MAXIMUM STRENGTH) 60-1200 MG TB12 Take 1 tablet by mouth 2 (two) times  daily as needed.    Marland Kitchen DHEA 50 MG TABS Take 50 mg by mouth daily with breakfast.     . EPINEPHrine 0.3 mg/0.3 mL IJ SOAJ injection Inject 0.3 mLs (0.3 mg total) into the muscle as needed (for angioedema). 2 Device 0  . famotidine (PEPCID) 20 MG tablet Take 20 mg by mouth daily as needed for heartburn or indigestion.    . Ferrous Sulfate (CVS SLOW RELEASE IRON PO) Take 45 mg by mouth 2 (two) times daily.    . finasteride (PROSCAR) 5 MG tablet Take 5 mg by mouth at bedtime.     Marland Kitchen ipratropium (ATROVENT) 0.06 % nasal spray INSTILL 2 SPRAYS IN EACH NOSTRIL TID PRF RHINORRHEA  3  . levalbuterol (XOPENEX HFA) 45 MCG/ACT inhaler INHALE 2 PUFFS VIA SPACER Q 6 HOURS UNTIL SYMPTOMS IMPROVE.  1  . levothyroxine (SYNTHROID, LEVOTHROID) 112 MCG tablet Take 112 mcg by mouth daily before breakfast.     . liothyronine (CYTOMEL) 25 MCG tablet Take 25 mcg by mouth daily.     Marland Kitchen losartan (COZAAR) 50 MG tablet Take 1 tablet (50 mg total) by mouth daily. 30 tablet 6  . metoprolol tartrate (LOPRESSOR) 50 MG tablet TAKE 1 TABLET(50 MG) BY MOUTH TWICE DAILY 60 tablet 7  . Multiple Vitamins-Minerals (ONE-A-DAY MENS HEALTH FORMULA PO) Take 1 tablet by mouth daily with breakfast.     . omeprazole (PRILOSEC) 40 MG capsule Take 1 capsule (40 mg total) by mouth 2 (two) times daily. 180 capsule 1  . rizatriptan (MAXALT) 10 MG tablet Take 10 mg by mouth as needed for migraine. May repeat in 2 hours if needed    . tamsulosin (FLOMAX) 0.4 MG CAPS capsule Take 0.4 mg by mouth daily after supper.    . testosterone cypionate (DEPOTESTOTERONE CYPIONATE) 100 MG/ML injection Inject 200 mg into the muscle every Thursday. For IM use only     . venlafaxine (EFFEXOR-XR) 150 MG 24 hr capsule Take 150 mg by mouth daily with breakfast.     . XARELTO 20 MG TABS tablet TAKE 1 TABLET(20 MG) BY MOUTH DAILY WITH SUPPER 90 tablet 1   No facility-administered medications prior to visit.      Review of Systems:   Constitutional:   No  weight loss,  night sweats,  Fevers, chills,  +fatigue, or  lassitude.  HEENT:   No headaches,  Difficulty swallowing,  Tooth/dental problems, or  Sore throat,                No sneezing, itching, ear ache, + nasal congestion, post nasal drip,   CV:  No chest pain,  Orthopnea, PND, swelling in lower extremities, anasarca, dizziness, palpitations, syncope.   GI  No heartburn, indigestion, abdominal pain, nausea, vomiting, diarrhea, change in bowel habits, loss  of appetite, bloody stools.   Resp:   No chest wall deformity  Skin: no rash or lesions.  GU: no dysuria, change in color of urine, no urgency or frequency.  No flank pain, no hematuria   MS:  No joint pain or swelling.  No decreased range of motion.  No back pain.    Physical Exam  BP 138/80 (BP Location: Left Arm, Cuff Size: Normal)   Pulse 61   Ht 5\' 10"  (1.778 m)   Wt 272 lb (123.4 kg)   SpO2 97%   BMI 39.03 kg/m   GEN: A/Ox3; pleasant , NAD obese   HEENT:  [place]/AT,  EACs-clear, TMs-wnl, NOSE-clear, THROAT-clear, no lesions, no postnasal drip or exudate noted.  Class III-IV MP airway  NECK:  Supple w/ fair ROM; no JVD; normal carotid impulses w/o bruits; no thyromegaly or nodules palpated; no lymphadenopathy.    RESP  Clear  P & A; w/o, wheezes/ rales/ or rhonchi. no accessory muscle use, no dullness to percussion  CARD:  RRR, Gr II SM , no peripheral edema, pulses intact, no cyanosis or clubbing.  GI:   Soft & nt; nml bowel sounds; no organomegaly or masses detected.   Musco: Warm bil, no deformities or joint swelling noted.   Neuro: alert, no focal deficits noted.    Skin: Warm, no lesions or rashes    Lab Results:  CBC  BMET  BNP  ProBNP No results found for: PROBNP  Imaging:  PFT Results Latest Ref Rng & Units 06/01/2017  FVC-Pre L 4.31  FVC-Predicted Pre % 92  FVC-Post L 4.46  FVC-Predicted Post % 96  Pre FEV1/FVC % % 81  Post FEV1/FCV % % 83  FEV1-Pre L 3.49  FEV1-Predicted Pre % 100  FEV1-Post L  3.71  DLCO UNC% % 101  DLCO COR %Predicted % 98  TLC L 7.52  TLC % Predicted % 107  RV % Predicted % 124    Lab Results  Component Value Date   NITRICOXIDE 28 11/15/2018        Assessment & Plan:   Acute asthmatic bronchitis Recent flare with chronic cough -improved on current regimen  Patient's medications were reviewed today and patient education was given. Computerized medication calendar was adjusted/completed   Plan  Patient Instructions  Continue on current regimen  Follow med calendar closely and bring to each visit.  Follow up with Dr. Melvyn Novas  In 3 months and As needed       Seasonal and perennial allergic rhinitis Continue with trigger prevention   Plan  Patient Instructions  Continue on current regimen  Follow med calendar closely and bring to each visit.  Follow up with Dr. Melvyn Novas  In 3 months and As needed          Rexene Edison, NP 12/16/2018

## 2018-12-20 ENCOUNTER — Telehealth: Payer: Self-pay | Admitting: Internal Medicine

## 2018-12-20 ENCOUNTER — Ambulatory Visit (INDEPENDENT_AMBULATORY_CARE_PROVIDER_SITE_OTHER)
Admission: RE | Admit: 2018-12-20 | Discharge: 2018-12-20 | Disposition: A | Discharge status: Home / Self Care | Payer: Medicare Other | Source: Ambulatory Visit | Attending: Adult Health | Admitting: Adult Health

## 2018-12-20 ENCOUNTER — Ambulatory Visit (INDEPENDENT_AMBULATORY_CARE_PROVIDER_SITE_OTHER): Payer: Medicare Other | Admitting: Adult Health

## 2018-12-20 ENCOUNTER — Encounter: Payer: Self-pay | Admitting: Adult Health

## 2018-12-20 VITALS — BP 126/66 | HR 64 | Temp 98.6°F

## 2018-12-20 DIAGNOSIS — J45991 Cough variant asthma: Secondary | ICD-10-CM

## 2018-12-20 DIAGNOSIS — J45909 Unspecified asthma, uncomplicated: Secondary | ICD-10-CM | POA: Diagnosis not present

## 2018-12-20 DIAGNOSIS — J3089 Other allergic rhinitis: Secondary | ICD-10-CM | POA: Diagnosis not present

## 2018-12-20 DIAGNOSIS — J302 Other seasonal allergic rhinitis: Secondary | ICD-10-CM

## 2018-12-20 DIAGNOSIS — K219 Gastro-esophageal reflux disease without esophagitis: Secondary | ICD-10-CM

## 2018-12-20 DIAGNOSIS — J40 Bronchitis, not specified as acute or chronic: Secondary | ICD-10-CM | POA: Diagnosis not present

## 2018-12-20 MED ORDER — METHYLPREDNISOLONE ACETATE 80 MG/ML IJ SUSP
120.0000 mg | Freq: Once | INTRAMUSCULAR | Status: AC
  Administered 2018-12-20: 120 mg via INTRAMUSCULAR

## 2018-12-20 MED ORDER — METHYLPREDNISOLONE ACETATE 80 MG/ML IJ SUSP: 80.0000 mg | Freq: Once | INTRAMUSCULAR | Status: DC

## 2018-12-20 MED ORDER — LEVALBUTEROL HCL 0.63 MG/3ML IN NEBU
0.6300 mg | INHALATION_SOLUTION | Freq: Once | RESPIRATORY_TRACT | Status: AC
  Administered 2018-12-20: 0.63 mg via RESPIRATORY_TRACT

## 2018-12-20 NOTE — Telephone Encounter (Signed)
Primary Pulmonologist: MW Last office visit and with whom: 12/16/2018 What do we see them for (pulmonary problems): cough Last OV assessment/plan:  Editor: Parrett, Fonnie Mu, NP (Nurse Practitioner)    Continue on current regimen  Follow med calendar closely and bring to each visit.  Follow up with Dr. Melvyn Novas  In 3 months and As needed       Was appointment offered to patient (explain)?  Yes, scheduled with TP at 230pm  Reason for call: Pt was seen on 12/16/2018 by TP for cough, and is taking delsym cough syrup, mucinex, and tessalon pearls. He states not much relief, he has increase productive cough-tan/green, chest tightness and some wheezing. He feels that the cough has worsen over the weekend. Pt is requesting a shot today's visit with TP at 230pm Nothing further needed at this time.

## 2018-12-20 NOTE — Patient Instructions (Addendum)
Mucinex Twice daily  As needed  Cough  Deslym 2 tsp Twice daily  As needed  Cough Tessalon Three times a day  As needed  Cough .  Try Chlortabs 4mg  Twice daily  As needed  Drainage , throat clearing , drippy nose.  Steroid injection today .  Follow up with Dr. Melvyn Novas in 6 weeks and As needed   Please contact office for sooner follow up if symptoms do not improve or worsen or seek emergency care

## 2018-12-20 NOTE — Progress Notes (Signed)
@Patient  ID: Grant [name], male    DOB: May 20, 1951, 68 y.o.   MRN: [id number]  Chief Complaint  Patient presents with  . Acute Visit    Referring provider: Shirline Frees, MD  HPI: 68 year old male former smoker followed for asthma and chronic bronchitis, allergic rhinitis, obstructive sleep apnea, chronic cough (~2012)  Past medical history significant for A. fib on chronic anticoagulation, Depression , angioedema, aortic valve replacement, toxic encephalopathy, light sensitivity, chronic fatigue/fibromyalgia  TEST PFT 06/01/17-WNL-FVC 4.46/96%, FEV1 3.71/106%, ratio 0.83, FEF 25-75%4.64/167%, no response to dilator, TLC 107%, DLCO 101%  NPSG 03/24/03-AHI 88/ hr, desaturation to 75%, body weight 244 lbs  12/20/2018 Acute OV : Asthma /cough  Patient presents for an acute office visit.  Patient complains over the last 3 days that he has had a flare of his chronic cough.  Patient feels his cough is doing better taking Delsym and Tessalon Perles.  He is has underlying asthma was taking Symbicort twice daily.  Says 3 days ago his cough started to increase and is been worse for the last 24 hours.  He has severe coughing paroxysms.  The cough is interfering with his sleep.  Cough is mainly dry with minimal production of mucus.  He has no associated fever, chest pain orthopnea or edema.  Patient is requesting a Depo-Medrol injection.  He says this is the only thing that helps him.  He is unable to take oral steroids.  Says that they cause him severe mood changes and insomnia.  We discussed potential complications of frequent steroid use. Chest x-ray today shows no acute process.. Clear lungs.  Allergies  Allergen Reactions  . Amiodarone Swelling  . Codeine Itching  . Tramadol     dellusion     Immunization History  Administered Date(s) Administered  . Influenza, High Dose Seasonal PF 06/28/2018  . Influenza,inj,Quad PF,6+ Mos 10/31/2016, 07/12/2017  . Pneumococcal  Polysaccharide-23 07/12/2017    Past Medical History:  Diagnosis Date  . Allergic rhinitis   . Anxiety   . Arthritis   . Asthma   . Bladder tumor   . Chronic fatigue   . Chronic fatigue   . Depression    06/30/17 Pt denies being depressed, reports Effexor is taken for Chronic Fatigue   . Dyspnea   . Enlarged prostate   . Fibromyalgia   . GERD (gastroesophageal reflux disease)   . Headache   . History of chronic bronchitis   . History of migraine   . History of toxic encephalopathy   . Hypothyroidism   . OSA on CPAP    CPAP 14  . PAF (paroxysmal atrial fibrillation) ([place]) CARDIOLOGIST -- DR [name]   DX OCT 2013  . 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 Unspecified essential hypertension   . Urethral tumor    PROSTATIC    Tobacco History: Social History   Tobacco Use  Smoking Status Former Smoker  . Packs/day: 0.50  . Years: 27.00  . Pack years: 13.50  . Types: Cigarettes  . Last attempt to quit: 10/21/1979  . Years since quitting: 39.1  Smokeless Tobacco Never Used   Counseling given: Not Answered   Outpatient Medications Prior to Visit  Medication Sig Dispense Refill  . acetaminophen (TYLENOL) 500 MG tablet Take 500 mg by mouth every 6 (six) hours as needed for moderate pain.    Marland Kitchen albuterol (VENTOLIN HFA) 108 (90 Base) MCG/ACT inhaler INHALE 2 PUFFS INTO LUNGS EVERY 6 HOURS AS NEEDED FOR WHEEZING OR SHORTNESS  OF BREATH 18 g 12  . azelastine (ASTELIN) 137 MCG/SPRAY nasal spray Place 1-2 sprays into the nose 2 (two) times daily as needed for rhinitis or allergies.     . benzonatate (TESSALON) 200 MG capsule Take 1 capsule by mouth every 8 hours as needed for cough 20 capsule 1  . budesonide-formoterol (SYMBICORT) 160-4.5 MCG/ACT inhaler Inhale 2 puffs into the lungs 2 (two) times daily.    . cetirizine (ZYRTEC) 10 MG tablet Take 10 mg by mouth daily.    Marland Kitchen COLCRYS 0.6 MG tablet Take 0.6 mg by mouth daily. With breakfast    . Cyanocobalamin (VITAMIN B12 SL) Place 1,200 mcg  under the tongue daily.     Marland Kitchen dextromethorphan (DELSYM) 30 MG/5ML liquid Take by mouth as needed for cough.    . Dextromethorphan-guaiFENesin (MUCINEX DM MAXIMUM STRENGTH) 60-1200 MG TB12 Take 1 tablet by mouth 2 (two) times daily as needed.    Marland Kitchen DHEA 50 MG TABS Take 50 mg by mouth daily with breakfast.     . EPINEPHrine 0.3 mg/0.3 mL IJ SOAJ injection Inject 0.3 mLs (0.3 mg total) into the muscle as needed (for angioedema). 2 Device 0  . famotidine (PEPCID) 20 MG tablet Take 20 mg by mouth daily as needed for heartburn or indigestion.    . Ferrous Sulfate ([employer] SLOW RELEASE IRON PO) Take 45 mg by mouth 2 (two) times daily.    . finasteride (PROSCAR) 5 MG tablet Take 5 mg by mouth at bedtime.     Marland Kitchen ipratropium (ATROVENT) 0.06 % nasal spray INSTILL 2 SPRAYS IN EACH NOSTRIL TID PRF RHINORRHEA  3  . levalbuterol (XOPENEX HFA) 45 MCG/ACT inhaler INHALE 2 PUFFS VIA SPACER Q 6 HOURS UNTIL SYMPTOMS IMPROVE.  1  . levothyroxine (SYNTHROID, LEVOTHROID) 112 MCG tablet Take 112 mcg by mouth daily before breakfast.     . liothyronine (CYTOMEL) 25 MCG tablet Take 25 mcg by mouth daily.     Marland Kitchen losartan (COZAAR) 50 MG tablet Take 1 tablet (50 mg total) by mouth daily. 30 tablet 6  . metoprolol tartrate (LOPRESSOR) 50 MG tablet TAKE 1 TABLET(50 MG) BY MOUTH TWICE DAILY 60 tablet 7  . Multiple Vitamins-Minerals (ONE-A-DAY MENS HEALTH FORMULA PO) Take 1 tablet by mouth daily with breakfast.     . omeprazole (PRILOSEC) 40 MG capsule Take 1 capsule (40 mg total) by mouth 2 (two) times daily. 180 capsule 1  . rizatriptan (MAXALT) 10 MG tablet Take 10 mg by mouth as needed for migraine. May repeat in 2 hours if needed    . tamsulosin (FLOMAX) 0.4 MG CAPS capsule Take 0.4 mg by mouth daily after supper.    . testosterone cypionate (DEPOTESTOTERONE CYPIONATE) 100 MG/ML injection Inject 200 mg into the muscle every Thursday. For IM use only     . venlafaxine (EFFEXOR-XR) 150 MG 24 hr capsule Take 150 mg by mouth daily with  breakfast.     . XARELTO 20 MG TABS tablet TAKE 1 TABLET(20 MG) BY MOUTH DAILY WITH SUPPER 90 tablet 1   No facility-administered medications prior to visit.      Review of Systems:   Constitutional:   No  weight loss, night sweats,  Fevers, chills,  +fatigue, or  lassitude.  HEENT:   No headaches,  Difficulty swallowing,  Tooth/dental problems, or  Sore throat,                No sneezing, itching, ear ache, + nasal congestion, post nasal drip,  CV:  No chest pain,  Orthopnea, PND, swelling in lower extremities, anasarca, dizziness, palpitations, syncope.   GI  No heartburn, indigestion, abdominal pain, nausea, vomiting, diarrhea, change in bowel habits, loss of appetite, bloody stools.   Resp:  No chest wall deformity  Skin: no rash or lesions.  GU: no dysuria, change in color of urine, no urgency or frequency.  No flank pain, no hematuria   MS:  No joint pain or swelling.  No decreased range of motion.  No back pain.    Physical Exam  BP 126/66 (BP Location: Left Arm, Cuff Size: Large)   Pulse 64   Temp 98.6 F (37 C) (Oral)   SpO2 97%   GEN: A/Ox3; pleasant , NAD, morbidly obese   HEENT:  [place]/AT,  EACs-clear, TMs-wnl, NOSE-clear, THROAT-clear, no lesions, no postnasal drip or exudate noted.  Class III MP airway  NECK:  Supple w/ fair ROM; no JVD; normal carotid impulses w/o bruits; no thyromegaly or nodules palpated; no lymphadenopathy.    RESP  Clear  P & A; w/o, wheezes/ rales/ or rhonchi. no accessory muscle use, no dullness to percussion  CARD:  RRR, no m/r/g, no peripheral edema, pulses intact, no cyanosis or clubbing.  GI:   Soft & nt; nml bowel sounds; no organomegaly or masses detected.   Musco: Warm bil, no deformities or joint swelling noted.   Neuro: alert, no focal deficits noted.    Skin: Warm, no lesions or rashes    Lab Results:  CBC  BMET  BNP   ProBNP No results found for: PROBNP  Imaging: Dg Chest 2 View  Result Date:  12/20/2018 CLINICAL DATA:  Bronchitis follow-up. EXAM: CHEST - 2 VIEW COMPARISON:  Mar 07, 2018 FINDINGS: The heart size and mediastinal contours are within normal limits. Both lungs are clear. The visualized skeletal structures are unremarkable. IMPRESSION: No active cardiopulmonary disease. Electronically Signed   By: Dorise Bullion III M.D   On: 12/20/2018 14:59    methylPREDNISolone acetate (DEPO-MEDROL) injection 120 mg    Date Action Dose Route User   11/15/2018 1722 Given 120 mg Intramuscular (Right Upper Outer Quadrant) Rosana Berger, CMA      PFT Results Latest Ref Rng & Units 06/01/2017  FVC-Pre L 4.31  FVC-Predicted Pre % 92  FVC-Post L 4.46  FVC-Predicted Post % 96  Pre FEV1/FVC % % 81  Post FEV1/FCV % % 83  FEV1-Pre L 3.49  FEV1-Predicted Pre % 100  FEV1-Post L 3.71  DLCO UNC% % 101  DLCO COR %Predicted % 98  TLC L 7.52  TLC % Predicted % 107  RV % Predicted % 124    Lab Results  Component Value Date   NITRICOXIDE 28 11/15/2018        Assessment & Plan:   No problem-specific Assessment & Plan notes found for this encounter.     Rexene Edison, NP 12/20/2018

## 2018-12-21 NOTE — Assessment & Plan Note (Signed)
Continue on trigger prevention 

## 2018-12-21 NOTE — Assessment & Plan Note (Addendum)
Flare of chronic cough.  Patient is on maximum preventative therapy and cough control medications.  Discussed in detail potential complications of frequent steroid use.  Continue on cough control along with trigger prevention. Depo-Medrol 120 IM injection given today Xopenex nebulizer treatment given in the office  Plan  Patient Instructions  Mucinex Twice daily  As needed  Cough  Deslym 2 tsp Twice daily  As needed  Cough Tessalon Three times a day  As needed  Cough .  Try Chlortabs 4mg  Twice daily  As needed  Drainage , throat clearing , drippy nose.  Steroid injection today .  Follow up with Dr. Melvyn Novas in 6 weeks and As needed   Please contact office for sooner follow up if symptoms do not improve or worsen or seek emergency care

## 2018-12-27 NOTE — Addendum Note (Signed)
Addended by: Parke Poisson E on: 12/27/2018 04:56 PM   Modules accepted: Orders

## 2019-01-03 ENCOUNTER — Ambulatory Visit: Payer: Medicare Other | Admitting: Internal Medicine

## 2019-01-24 DIAGNOSIS — G4733 Obstructive sleep apnea (adult) (pediatric): Secondary | ICD-10-CM | POA: Diagnosis not present

## 2019-02-09 ENCOUNTER — Encounter: Payer: Medicare Other | Admitting: Cardiothoracic Surgery

## 2019-02-10 ENCOUNTER — Other Ambulatory Visit: Payer: Self-pay | Admitting: *Deleted

## 2019-02-10 ENCOUNTER — Other Ambulatory Visit: Payer: Self-pay | Admitting: Internal Medicine

## 2019-02-11 DIAGNOSIS — E291 Testicular hypofunction: Secondary | ICD-10-CM | POA: Diagnosis not present

## 2019-02-11 DIAGNOSIS — E039 Hypothyroidism, unspecified: Secondary | ICD-10-CM | POA: Diagnosis not present

## 2019-02-11 DIAGNOSIS — G43909 Migraine, unspecified, not intractable, without status migrainosus: Secondary | ICD-10-CM | POA: Diagnosis not present

## 2019-02-11 DIAGNOSIS — I10 Essential (primary) hypertension: Secondary | ICD-10-CM | POA: Diagnosis not present

## 2019-02-16 ENCOUNTER — Encounter (HOSPITAL_COMMUNITY): Payer: Self-pay | Admitting: Internal Medicine

## 2019-02-16 ENCOUNTER — Observation Stay (HOSPITAL_COMMUNITY): Payer: Medicare Other

## 2019-02-16 ENCOUNTER — Observation Stay (HOSPITAL_COMMUNITY)
Admission: EM | Admit: 2019-02-16 | Discharge: 2019-02-17 | Disposition: A | Discharge status: Home / Self Care | Payer: Medicare Other | Attending: Internal Medicine | Admitting: Internal Medicine

## 2019-02-16 ENCOUNTER — Emergency Department (HOSPITAL_COMMUNITY): Payer: Medicare Other

## 2019-02-16 DIAGNOSIS — F329 Major depressive disorder, single episode, unspecified: Secondary | ICD-10-CM | POA: Insufficient documentation

## 2019-02-16 DIAGNOSIS — F419 Anxiety disorder, unspecified: Secondary | ICD-10-CM | POA: Diagnosis not present

## 2019-02-16 DIAGNOSIS — Z9989 Dependence on other enabling machines and devices: Secondary | ICD-10-CM

## 2019-02-16 DIAGNOSIS — Z888 Allergy status to other drugs, medicaments and biological substances status: Secondary | ICD-10-CM | POA: Diagnosis not present

## 2019-02-16 DIAGNOSIS — R4020 Unspecified coma: Secondary | ICD-10-CM | POA: Diagnosis not present

## 2019-02-16 DIAGNOSIS — I480 Paroxysmal atrial fibrillation: Secondary | ICD-10-CM | POA: Insufficient documentation

## 2019-02-16 DIAGNOSIS — R739 Hyperglycemia, unspecified: Secondary | ICD-10-CM | POA: Diagnosis not present

## 2019-02-16 DIAGNOSIS — R4781 Slurred speech: Secondary | ICD-10-CM | POA: Diagnosis not present

## 2019-02-16 DIAGNOSIS — Z79899 Other long term (current) drug therapy: Secondary | ICD-10-CM | POA: Insufficient documentation

## 2019-02-16 DIAGNOSIS — Z6837 Body mass index (BMI) 37.0-37.9, adult: Secondary | ICD-10-CM | POA: Diagnosis not present

## 2019-02-16 DIAGNOSIS — I672 Cerebral atherosclerosis: Secondary | ICD-10-CM | POA: Diagnosis not present

## 2019-02-16 DIAGNOSIS — G4733 Obstructive sleep apnea (adult) (pediatric): Secondary | ICD-10-CM | POA: Diagnosis not present

## 2019-02-16 DIAGNOSIS — M797 Fibromyalgia: Secondary | ICD-10-CM | POA: Diagnosis not present

## 2019-02-16 DIAGNOSIS — Z9049 Acquired absence of other specified parts of digestive tract: Secondary | ICD-10-CM | POA: Insufficient documentation

## 2019-02-16 DIAGNOSIS — R4701 Aphasia: Secondary | ICD-10-CM | POA: Diagnosis not present

## 2019-02-16 DIAGNOSIS — Z87891 Personal history of nicotine dependence: Secondary | ICD-10-CM | POA: Insufficient documentation

## 2019-02-16 DIAGNOSIS — K219 Gastro-esophageal reflux disease without esophagitis: Secondary | ICD-10-CM | POA: Insufficient documentation

## 2019-02-16 DIAGNOSIS — R410 Disorientation, unspecified: Secondary | ICD-10-CM | POA: Diagnosis not present

## 2019-02-16 DIAGNOSIS — Z885 Allergy status to narcotic agent status: Secondary | ICD-10-CM | POA: Insufficient documentation

## 2019-02-16 DIAGNOSIS — E039 Hypothyroidism, unspecified: Secondary | ICD-10-CM | POA: Diagnosis not present

## 2019-02-16 DIAGNOSIS — R930 Abnormal findings on diagnostic imaging of skull and head, not elsewhere classified: Secondary | ICD-10-CM | POA: Insufficient documentation

## 2019-02-16 DIAGNOSIS — R413 Other amnesia: Secondary | ICD-10-CM

## 2019-02-16 DIAGNOSIS — Z79890 Hormone replacement therapy (postmenopausal): Secondary | ICD-10-CM | POA: Insufficient documentation

## 2019-02-16 DIAGNOSIS — I10 Essential (primary) hypertension: Secondary | ICD-10-CM | POA: Diagnosis not present

## 2019-02-16 DIAGNOSIS — J45909 Unspecified asthma, uncomplicated: Secondary | ICD-10-CM | POA: Diagnosis not present

## 2019-02-16 DIAGNOSIS — Z7901 Long term (current) use of anticoagulants: Secondary | ICD-10-CM | POA: Diagnosis not present

## 2019-02-16 LAB — RAPID URINE DRUG SCREEN, HOSP PERFORMED
Amphetamines: NOT DETECTED
Barbiturates: NOT DETECTED
Benzodiazepines: NOT DETECTED
Cocaine: NOT DETECTED
Opiates: NOT DETECTED
Tetrahydrocannabinol: NOT DETECTED

## 2019-02-16 LAB — COMPREHENSIVE METABOLIC PANEL
ALT: 37 U/L (ref 0–44)
AST: 26 U/L (ref 15–41)
Albumin: 3.8 g/dL (ref 3.5–5.0)
Alkaline Phosphatase: 60 U/L (ref 38–126)
Anion gap: 9 (ref 5–15)
BUN: 14 mg/dL (ref 8–23)
CO2: 25 mmol/L (ref 22–32)
Calcium: 9 mg/dL (ref 8.9–10.3)
Chloride: 104 mmol/L (ref 98–111)
Creatinine, Ser: 1.25 mg/dL — ABNORMAL HIGH (ref 0.61–1.24)
GFR calc Af Amer: >60 mL/min (ref >60)
GFR calc non Af Amer: >60 mL/min (ref >60)
Glucose, Bld: 105 mg/dL — ABNORMAL HIGH (ref 70–99)
Potassium: 4 mmol/L (ref 3.5–5.1)
Sodium: 138 mmol/L (ref 135–145)
Total Bilirubin: 0.6 mg/dL (ref 0.3–1.2)
Total Protein: 6.2 g/dL — ABNORMAL LOW (ref 6.5–8.1)

## 2019-02-16 LAB — URINALYSIS, ROUTINE W REFLEX MICROSCOPIC
Bilirubin Urine: NEGATIVE
Glucose, UA: NEGATIVE mg/dL
Hgb urine dipstick: NEGATIVE
Ketones, ur: NEGATIVE mg/dL
Leukocytes,Ua: NEGATIVE
Nitrite: NEGATIVE
Protein, ur: NEGATIVE mg/dL
Specific Gravity, Urine: 1.01 (ref 1.005–1.030)
pH: 5 (ref 5.0–8.0)

## 2019-02-16 LAB — CBC
HCT: 43 % (ref 39.0–52.0)
Hemoglobin: 14.7 g/dL (ref 13.0–17.0)
MCH: 32.6 pg (ref 26.0–34.0)
MCHC: 34.2 g/dL (ref 30.0–36.0)
MCV: 95.3 fL (ref 80.0–100.0)
Platelets: 202 10*3/uL (ref 150–400)
RBC: 4.51 MIL/uL (ref 4.22–5.81)
RDW: 11.9 % (ref 11.5–15.5)
WBC: 9 10*3/uL (ref 4.0–10.5)
nRBC: 0 % (ref 0.0–0.2)

## 2019-02-16 LAB — DIFFERENTIAL
Abs Immature Granulocytes: 0.05 10*3/uL (ref 0.00–0.07)
Basophils Absolute: 0.1 10*3/uL (ref 0.0–0.1)
Basophils Relative: 1 %
Eosinophils Absolute: 0.1 10*3/uL (ref 0.0–0.5)
Eosinophils Relative: 1 %
Immature Granulocytes: 1 %
Lymphocytes Relative: 14 %
Lymphs Abs: 1.3 10*3/uL (ref 0.7–4.0)
Monocytes Absolute: 1.1 10*3/uL — ABNORMAL HIGH (ref 0.1–1.0)
Monocytes Relative: 12 %
Neutro Abs: 6.5 10*3/uL (ref 1.7–7.7)
Neutrophils Relative %: 71 %

## 2019-02-16 LAB — APTT: aPTT: 35 seconds (ref 24–36)

## 2019-02-16 LAB — T4, FREE: Free T4: 0.68 ng/dL — ABNORMAL LOW (ref 0.82–1.77)

## 2019-02-16 LAB — TSH: TSH: 0.951 u[IU]/mL (ref 0.350–4.500)

## 2019-02-16 LAB — ETHANOL: Alcohol, Ethyl (B): <10 mg/dL (ref <10)

## 2019-02-16 LAB — PROTIME-INR
INR: 1.1 (ref 0.8–1.2)
Prothrombin Time: 14.4 seconds (ref 11.4–15.2)

## 2019-02-16 LAB — I-STAT CREATININE, ED: Creatinine, Ser: 1.2 mg/dL (ref 0.61–1.24)

## 2019-02-16 MED ORDER — LORAZEPAM 2 MG/ML IJ SOLN
1.0000 mg | Freq: Once | INTRAMUSCULAR | Status: AC
  Administered 2019-02-16: 1 mg via INTRAVENOUS
  Filled 2019-02-16: qty 1

## 2019-02-16 MED ORDER — LEVOTHYROXINE SODIUM 112 MCG PO TABS
112.0000 ug | ORAL_TABLET | Freq: Every day | ORAL | Status: DC
  Administered 2019-02-17: 06:00:00 112 ug via ORAL
  Filled 2019-02-16: qty 1

## 2019-02-16 MED ORDER — RIVAROXABAN 20 MG PO TABS
20.0000 mg | ORAL_TABLET | Freq: Every day | ORAL | Status: DC
  Filled 2019-02-16: qty 1

## 2019-02-16 MED ORDER — SENNOSIDES-DOCUSATE SODIUM 8.6-50 MG PO TABS: 1.0000 | ORAL_TABLET | Freq: Every evening | ORAL | Status: DC | PRN

## 2019-02-16 MED ORDER — ASPIRIN 325 MG PO TABS
325.0000 mg | ORAL_TABLET | Freq: Every day | ORAL | Status: DC
  Administered 2019-02-17: 325 mg via ORAL
  Filled 2019-02-16: qty 1

## 2019-02-16 MED ORDER — ASPIRIN 300 MG RE SUPP: 300.0000 mg | Freq: Every day | RECTAL | Status: DC

## 2019-02-16 MED ORDER — STROKE: EARLY STAGES OF RECOVERY BOOK
Freq: Once | Status: AC
  Administered 2019-02-16: 20:00:00

## 2019-02-16 MED ORDER — ACETAMINOPHEN 325 MG PO TABS
650.0000 mg | ORAL_TABLET | ORAL | Status: DC | PRN
  Administered 2019-02-16 – 2019-02-17 (×2): 650 mg via ORAL
  Filled 2019-02-16 (×2): qty 2

## 2019-02-16 MED ORDER — COLCHICINE 0.6 MG PO TABS
0.6000 mg | ORAL_TABLET | Freq: Every day | ORAL | Status: DC
  Administered 2019-02-17: 0.6 mg via ORAL
  Filled 2019-02-16: qty 1

## 2019-02-16 MED ORDER — PANTOPRAZOLE SODIUM 40 MG PO TBEC
40.0000 mg | DELAYED_RELEASE_TABLET | Freq: Two times a day (BID) | ORAL | Status: DC
  Administered 2019-02-16 – 2019-02-17 (×2): 40 mg via ORAL
  Filled 2019-02-16 (×2): qty 1

## 2019-02-16 MED ORDER — VENLAFAXINE HCL ER 150 MG PO CP24
150.0000 mg | ORAL_CAPSULE | Freq: Every day | ORAL | Status: DC
  Administered 2019-02-17: 150 mg via ORAL
  Filled 2019-02-16: qty 1
  Filled 2019-02-16: qty 2
  Filled 2019-02-16: qty 1

## 2019-02-16 MED ORDER — ACETAMINOPHEN 650 MG RE SUPP: 650.0000 mg | RECTAL | Status: DC | PRN

## 2019-02-16 MED ORDER — SODIUM CHLORIDE 0.9 % IV SOLN
INTRAVENOUS | Status: DC
  Administered 2019-02-17: 01:00:00 via INTRAVENOUS

## 2019-02-16 MED ORDER — LORATADINE 10 MG PO TABS
10.0000 mg | ORAL_TABLET | Freq: Every day | ORAL | Status: DC
  Administered 2019-02-17: 10 mg via ORAL
  Filled 2019-02-16: qty 1

## 2019-02-16 MED ORDER — LIOTHYRONINE SODIUM 25 MCG PO TABS
25.0000 ug | ORAL_TABLET | Freq: Every day | ORAL | Status: DC
  Administered 2019-02-17: 25 ug via ORAL
  Filled 2019-02-16: qty 1

## 2019-02-16 MED ORDER — FINASTERIDE 5 MG PO TABS
5.0000 mg | ORAL_TABLET | Freq: Every day | ORAL | Status: DC
  Administered 2019-02-16: 5 mg via ORAL
  Filled 2019-02-16: qty 1

## 2019-02-16 MED ORDER — ACETAMINOPHEN 160 MG/5ML PO SOLN: 650.0000 mg | ORAL | Status: DC | PRN

## 2019-02-16 MED ORDER — MOMETASONE FURO-FORMOTEROL FUM 200-5 MCG/ACT IN AERO
2.0000 | INHALATION_SPRAY | Freq: Two times a day (BID) | RESPIRATORY_TRACT | Status: DC
  Administered 2019-02-17: 08:00:00 2 via RESPIRATORY_TRACT
  Filled 2019-02-16: qty 8.8

## 2019-02-16 MED ORDER — FAMOTIDINE 20 MG PO TABS: 20.0000 mg | ORAL_TABLET | Freq: Every day | ORAL | Status: DC | PRN

## 2019-02-16 NOTE — Code Documentation (Signed)
68yo male arriving to [facility] via private vehicle at 1518. 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 His wife later came out to the shop and noted he had "lost time" and could not remember events. Patient presented to the ED where a code stroke was activated. Stroke team to the bedside. Patient transported to CT. CT completed. NIHSS 1, see documentation for details and code stroke times. Patient stated the month was March on exam. Patient is contraindicated for treatment with tPA due to taking Xarelto. Confirmed with patient that he is complaint with his medication. Exam is not consistent with large vessel occlusion. No acute stroke treatment at this time. Code stroke canceled. Of note, patient with history of migraines with similar episodes. Patient reports that he has "felt one coming on" for over a week. He reports associated nausea. Pharmacy tech spoke with patient's wife who reports patient took a migraine medication this week that he does not take regularly due to tongue swelling and has had multiple doses of Benadryl over the last few days in attempts to prevent swelling. Patient to have MRI. Handoff with ED RN Raquel Sarna.

## 2019-02-16 NOTE — H&P (Signed)
History and Physical    Grant [name] URK:[id number] DOB: [birth year] DOA: 02/16/2019  PCP: Shirline Frees, MD Consultants:  [facility] Gum - CVTS; [place] - pulmonology; [facility] - cardiology; [place] - GI Patient coming from:  Home - lives with wife; NOK: Wife, [phone]  Chief Complaint: Confusion  HPI: Grant [name] is a 68 y.o. male with medical history significant of HTN; afib on Xarelto; asthma with chronic cough; OSA on CPAP; hypothyroidism; and  Fibromyalgia presenting with acute onset of confusion.  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  His wife walked up around that time and he didn't know what he was doing for a period of time.  He does have chronic fatigue "and sometimes I just go away for a minute."  This has happened in the past.  "The more people talk to me the less I listen" on a regular basis.  No N/W/T.  Some aphasia, but this is chronic for him.  No dysphagia this time, but he has had that in the past.   ED Course:  Acute onset of confusion.  CODE stroke called.  Dr. Leonie Man saw him via telemedicine.  ?seizure, likely needs MRI, EEG.    Review of Systems: As per HPI; otherwise review of systems reviewed and negative.   Ambulatory Status:  Ambulates without assistance  Past Medical History:  Diagnosis Date   Allergic rhinitis    Anxiety    Arthritis    Asthma    Bladder tumor    Chronic fatigue    Depression    06/30/17 Pt denies being depressed, reports Effexor is taken for Chronic Fatigue    Dyspnea    Enlarged prostate    Fibromyalgia    GERD (gastroesophageal reflux disease)    Headache    History of chronic bronchitis    History of migraine    History of toxic encephalopathy    Hypothyroidism    OSA on CPAP    CPAP 14   PAF (paroxysmal atrial fibrillation) ([place]) CARDIOLOGIST -- DR [name]   DX OCT 2013   Sensitiveness to light    Unspecified essential hypertension    Urethral tumor    PROSTATIC    Past Surgical History:  Procedure Laterality Date   BENTALL PROCEDURE N/A 07/02/2017   Procedure: BENTALL PROCEDURE;  Surgeon: Ivin Poot, MD;  Location: [facility];  Service: Open Heart Surgery;  Laterality: N/A;  WITH CIRC ARREST   CARDIAC SURGERY     COLONOSCOPY     CYSTOSCOPY W/ RETROGRADES Bilateral 06/15/2013   Procedure: CYSTOSCOPY WITH BILATERAL RETROGRADE PYELOGRAM  BLADDER BIOPSY, PROSTATIC URETHRAL BIOPSY, ;  Surgeon: Molli Hazard, MD;  Location[facility];  Service: Urology;  Laterality: Bilateral;   ESOPHAGOGASTRODUODENOSCOPY (EGD) WITH PROPOFOL N/A 03/11/2018   Procedure: ESOPHAGOGASTRODUODENOSCOPY (EGD) WITH PROPOFOL;  Surgeon: Clarene Essex, MD;  Location: [facility];  Service: Endoscopy;  Laterality: N/A;   LAPAROSCOPIC CHOLECYSTECTOMY  01-14-2001   RIGHT/LEFT HEART CATH AND CORONARY ANGIOGRAPHY N/A 06/12/2017   Procedure: RIGHT/LEFT HEART CATH AND CORONARY ANGIOGRAPHY;  Surgeon: Larey Dresser, MD;  Location: [facility];  Service: Cardiovascular;  Laterality: N/A;   TEE WITHOUT CARDIOVERSION N/A 07/02/2017   Procedure: TRANSESOPHAGEAL ECHOCARDIOGRAM (TEE);  Surgeon: Prescott Gum, Collier Salina, MD;  Location: [facility];  Service: Open Heart Surgery;  Laterality: N/A;   UMBILICAL HERNIA REPAIR  01-14-2008    Social History   Socioeconomic History   Marital  status: Married    Spouse name: Not on file   Number of children: Not on file   Years of education: Not on file   Highest education level: Not on file  Occupational History   Occupation: disabled    Comment: owner of buisness  Social Needs   Financial resource strain: Not on file   Food insecurity:    Worry: Not on file    Inability: Not on file   Transportation needs:    Medical: Not on file    Non-medical: Not on file  Tobacco Use   Smoking status: Former Smoker    Packs/day: 0.50    Years: 27.00    Pack years: 13.50    Types: Cigarettes    Last attempt  to quit: 10/21/1979    Years since quitting: 39.3   Smokeless tobacco: Never Used  Substance and Sexual Activity   Alcohol use: Yes    Alcohol/week: 21.0 standard drinks    Types: 21 Cans of beer per week    Comment: drinks "as much as I could" - 2-3 beers up to 8-10   Drug use: No   Sexual activity: Not on file  Lifestyle   Physical activity:    Days per week: Not on file    Minutes per session: Not on file   Stress: Not on file  Relationships   Social connections:    Talks on phone: Not on file    Gets together: Not on file    Attends religious service: Not on file    Active member of club or organization: Not on file    Attends meetings of clubs or organizations: Not on file    Relationship status: Not on file   Intimate partner violence:    Fear of current or ex partner: Not on file    Emotionally abused: Not on file    Physically abused: Not on file    Forced sexual activity: Not on file  Other Topics Concern   Not on file  Social History Narrative   Not on file    Allergies  Allergen Reactions   Perphenazine Other (See Comments)    Tongue swelling   Amiodarone Swelling   Codeine Itching   Tramadol Other (See Comments)    dellusion     Family History  Problem Relation Age of Onset   Aortic aneurysm Mother 15       cause of death   Other Father        motor vehicle accident   Heart disease Other        family history    Prior to Admission medications   Medication Sig Start Date End Date Taking? Authorizing Provider  acetaminophen (TYLENOL) 500 MG tablet Take 1,000 mg by mouth every 8 (eight) hours as needed for mild pain, fever or headache. Per bottle as needed   Yes [provider]  albuterol (VENTOLIN HFA) 108 (90 Base) MCG/ACT inhaler INHALE 2 PUFFS INTO LUNGS EVERY 6 HOURS AS NEEDED FOR WHEEZING OR SHORTNESS OF BREATH Patient taking differently: Inhale 2 puffs into the lungs every 6 (six) hours as needed for wheezing or  shortness of breath.  06/17/18  Yes Young, Clinton D, MD  amoxicillin (AMOXIL) 500 MG capsule Take 2,000 mg by mouth See admin instructions. 1 hour prior to dental procedure   Yes [provider]  amoxicillin-clavulanate (AUGMENTIN) 875-125 MG tablet Take 1 tablet by mouth See admin instructions. Twice daily for one wesk as needed  for diverticulitis flare   Yes [provider]  azelastine (ASTELIN) 137 MCG/SPRAY nasal spray Place 1-2 sprays into the nose 2 (two) times daily as needed for rhinitis or allergies.    Yes [provider]  benzonatate (TESSALON) 200 MG capsule Take 1 capsule by mouth every 8 hours as needed for cough 09/22/18  Yes Lauraine Rinne, NP  budesonide-formoterol (SYMBICORT) 160-4.5 MCG/ACT inhaler INHALE 2 PUFFS BY MOUTH TWICE DAILY 02/10/19  Yes Young, Tarri Fuller D, MD  cetirizine (ZYRTEC) 10 MG tablet Take 10 mg by mouth daily.   Yes [provider]  COLCRYS 0.6 MG tablet Take 0.6 mg by mouth daily.  01/29/12  Yes [provider]  dextromethorphan (DELSYM) 30 MG/5ML liquid Take 15 mg by mouth 2 (two) times daily as needed for cough.    Yes [provider]  Dextromethorphan-guaiFENesin (MUCINEX DM MAXIMUM STRENGTH) 60-1200 MG TB12 Take 1 tablet by mouth 2 (two) times daily as needed (cough).    Yes [provider]  DHEA 50 MG TABS Take 50 mg by mouth daily with breakfast.    Yes [provider]  diphenhydrAMINE (BENADRYL) 25 MG tablet Take 50 mg by mouth every 6 (six) hours as needed (tongue swelling due to Perphenazine).   Yes [provider]  EPINEPHrine 0.3 mg/0.3 mL IJ SOAJ injection Inject 0.3 mLs (0.3 mg total) into the muscle as needed (for angioedema). 02/26/18  Yes Josue Hector, MD  famotidine (PEPCID) 20 MG tablet Take 20 mg by mouth daily as needed for heartburn or indigestion.    Yes [provider]  Ferrous Sulfate ([employer] SLOW RELEASE IRON PO) Take 45 mg by mouth 2 (two) times daily.    Yes [provider]  finasteride (PROSCAR) 5 MG tablet Take 5 mg by mouth at bedtime.  01/14/14  Yes [provider]  levothyroxine (SYNTHROID, LEVOTHROID) 112 MCG tablet Take 112 mcg by mouth daily before breakfast.    Yes [provider]  liothyronine (CYTOMEL) 25 MCG tablet Take 25 mcg by mouth daily.    Yes [provider]  losartan (COZAAR) 50 MG tablet Take 1 tablet (50 mg total) by mouth daily. 11/08/18  Yes Ivin Poot, MD  metoprolol tartrate (LOPRESSOR) 50 MG tablet TAKE 1 TABLET(50 MG) BY MOUTH TWICE DAILY Patient taking differently: Take 50 mg by mouth 2 (two) times daily.  07/27/18  Yes Bhagat, Public house manager, PA  Multiple Vitamins-Minerals (ONE-A-DAY MENS HEALTH FORMULA PO) Take 1 tablet by mouth daily with breakfast.    Yes [provider]  omeprazole (PRILOSEC) 40 MG capsule Take 1 capsule (40 mg total) by mouth 2 (two) times daily. 10/31/16  Yes Josue Hector, MD  perphenazine (TRILAFON) 2 MG tablet Take 2 mg by mouth daily as needed (headaches or migraines).    Yes [provider]  rizatriptan (MAXALT) 10 MG tablet Take 10 mg by mouth as needed for migraine. May repeat in 2 hours if needed   Yes [provider]  tamsulosin (FLOMAX) 0.4 MG CAPS capsule Take 0.4 mg by mouth daily after supper.   Yes [provider]  testosterone cypionate (DEPOTESTOTERONE CYPIONATE) 100 MG/ML injection Inject 200 mg into the muscle every Thursday. For IM use only    Yes [provider]  venlafaxine (EFFEXOR-XR) 150 MG 24 hr capsule Take 150 mg by mouth daily with breakfast.    Yes [provider]  XARELTO 20 MG TABS tablet TAKE 1 TABLET(20 MG) BY MOUTH DAILY  WITH SUPPER Patient taking differently: Take 20 mg by mouth daily with supper.  11/18/18  Yes Josue Hector, MD    Physical Exam: Vitals:   02/16/19 1600 02/16/19 1615 02/16/19 1630  BP: 130/81 (!) 142/91 134/89  Pulse: 68 69 71  Resp: 12 19 12     SpO2: 96% 97% 97%      General:  Appears calm and comfortable and is NAD; wearing dark sunglasses throughout evaluation due to "light sensitivity"  Eyes:  PERRL, EOMI, normal lids, iris  ENT:  grossly normal hearing, lips & tongue, mmm; appropriate dentition  Neck:  no LAD, masses or thyromegaly  Cardiovascular:  RRR, no m/r/g. No LE edema.   Respiratory:   CTA bilaterally with no wheezes/rales/rhonchi.  Normal respiratory effort.  Abdomen:  soft, NT, ND, NABS; obese  Skin:  no rash or induration seen on limited exam  Musculoskeletal:  grossly normal tone BUE/BLE, good ROM, no bony abnormality  Psychiatric: blunted mood and affect, speech fluent and appropriate, AOx3  Neurologic:  CN 2-12 grossly intact, moves all extremities in coordinated fashion, sensation intact    Radiological Exams on Admission: Ct Head Code Stroke Wo Contrast  Result Date: 02/16/2019 CLINICAL DATA:  Code stroke. Altered level of consciousness, slurred speech EXAM: CT HEAD WITHOUT CONTRAST TECHNIQUE: Contiguous axial images were obtained from the base of the skull through the vertex without intravenous contrast. COMPARISON:  None. FINDINGS: Brain: No evidence of acute infarction, hemorrhage, hydrocephalus, extra-axial collection or mass lesion/mass effect. Vascular: Negative for hyperdense vessel Skull: Negative Sinuses/Orbits: Mild mucosal edema paranasal sinuses.  Normal orbit Other: None ASPECTS ([place] Stroke Program Early CT Score) - Ganglionic level infarction (caudate, lentiform nuclei, internal capsule, insula, M1-M3 cortex): 7 - Supraganglionic infarction (M4-M6 cortex): 3 Total score (0-10 with 10 being normal): 10 IMPRESSION: 1. No acute intracranial abnormality 2. ASPECTS is 10 3. These results were called by telephone at the time of interpretation on 02/16/2019 at 4:01 pm to Dr. Leonie Man , who verbally acknowledged these results. Electronically Signed   By: Franchot Gallo M.D.   On: 02/16/2019 16:01     EKG: Independently reviewed.  NSR with rate 69; IVCD with nonspecific ST changes with no evidence of acute ischemia; NSCSLT   Labs on Admission: I have personally reviewed the available labs and imaging studies at the time of the admission.  Pertinent labs:   Glucose 105 BUN 14/Creatinine 1.25/GFR >60 Normal CBC INR 1.1 ETOH <10  Assessment/Plan Principal Problem:   Confusion with non-focal neuro exam Active Problems:   Essential hypertension   PAF (paroxysmal atrial fibrillation) ([facility])   Chronic anticoagulation   Hypothyroidism   OSA on CPAP   Acute onset of confusion -Acute onset of confusion today -Neurology consulted and recommends overnight observation with MRI and EEG for further evaluation -If MRI/MRA and EEG are negative, this is likely a manifestation of his known chronic fatigue syndrome and no further evaluation/treatment is needed -Will place in observation status for further evaluation -Telemetry monitoring -Risk stratification with FLP, A1c; will also check TSH and UDS -ASA daily -Neurology consult -ST/Nutrition Consults -Likely unrelated, but he completed antibiotics (Augmentin) on 4/27 for diverticulitis and is feeling better from this standpoint  HTN -Allow permissive HTN for now -Treat BP only if >220/120, and then with goal of 15% reduction -Hold Cozaar and Lopressor and plan to restart in 48-72 hours   HLD -Check FLP -Cholesterol in 7/18: 184/45/94/225 -May benefit from statin therapy, but will depend on MRI result  in part   Hyperglycemia -Mildly elevated glucose with recurrent tests -Will check A1c without further treatment at this time  Afib on Xarelto -Hold Lopressor for rate control for now -Continue Xarelto  Hypothyroidism -Normal TSH in 10/18 -He is on both Synthroid and Cytomel  -Will check TSH, free T4, and free T3  OSA on CPAP -Hold CPAP pending results of MRI      DVT prophylaxis:  Lovenox  Code Status: Full -  confirmed with patient Family Communication: None present Disposition Plan:  Home once clinically improved Consults called: Neurology; ST/Nutrition  Admission status: It is my clinical opinion that referral for OBSERVATION is reasonable and necessary in this patient based on the above information provided. The aforementioned taken together are felt to place the patient at high risk for further clinical deterioration. However it is anticipated that the patient may be medically stable for discharge from the hospital within 24 to 48 hours.     Karmen Bongo MD [facility]   How to contact the [facility] Attending or Consulting provider [place] or covering provider during after hours [place], for this patient?  1. Check the care team in [facility] and look for a) attending/consulting [facility] provider listed and b) the [facility] team listed 2. Log into [email or web address] and use [place]'s universal password to access. If you do not have the password, please contact the hospital operator. 3. Locate the [facility] provider you are looking for under [facility] and page to a number that you can be directly reached. 4. If you still have difficulty reaching the provider, please page the [facility] (Director on Call) for the Hospitalists listed on amion for assistance.   02/16/2019, 5:40 PM

## 2019-02-16 NOTE — ED Triage Notes (Signed)
Pt arrives with LSW 1400, found with confusion and "loss of time" by wife, brought in POV

## 2019-02-16 NOTE — ED Provider Notes (Signed)
[facility] EMERGENCY DEPARTMENT Provider Note   CSN: [id number] Arrival date & time: 02/16/19  1518    History   Chief Complaint Chief Complaint  Patient presents with  . Stroke Symptoms    HPI Grant [name] is a 68 y.o. male.     68 yo M with a chief complaint of sudden onset confusion.  This started about an hour and a half ago.  Patient states he realized that he had just been standing there and was unsure why he was there or what was going on.  He still feels very confused and is having trouble finding words that he wants to say.  He denies headache denies neck pain denies head injury.  Denies cough congestion or fever denies exposure to the novel coronavirus.  He denies any medication changes or any recent change in his diet.  He denies unilateral numbness or weakness denies difficulty with his swallowing.  Denies difficulty with walking.  The history is provided by the patient.  Illness  Severity:  Moderate Onset quality:  Sudden Duration:  90 minutes Timing:  Constant Progression:  Worsening Chronicity:  New Associated symptoms: no abdominal pain, no chest pain, no congestion, no diarrhea, no fever, no headaches, no myalgias, no rash, no shortness of breath and no vomiting     Past Medical History:  Diagnosis Date  . Allergic rhinitis   . Anxiety   . Arthritis   . Asthma   . Bladder tumor   . Chronic fatigue   . Depression    06/30/17 Pt denies being depressed, reports Effexor is taken for Chronic Fatigue   . Dyspnea   . Enlarged prostate   . Fibromyalgia   . GERD (gastroesophageal reflux disease)   . Headache   . History of chronic bronchitis   . History of migraine   . History of toxic encephalopathy   . Hypothyroidism   . OSA on CPAP    CPAP 14  . PAF (paroxysmal atrial fibrillation) ([place]) CARDIOLOGIST -- DR [name]   DX OCT 2013  . 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 Unspecified essential hypertension   . Urethral tumor    PROSTATIC     Patient Active Problem List   Diagnosis Date Noted  . Cough variant asthma vs UACS 11/15/2018  . Cough 10/27/2018  . Angioedema 03/07/2018  . Sepsis ([place]) 03/07/2018  . Asthma 03/07/2018  . Hypothyroidism 03/07/2018  . OSA on CPAP 03/07/2018  . BPH (benign prostatic hyperplasia) 03/07/2018  . Allergic reaction 01/21/2018  . Atrial fibrillation with RVR ([place]) 07/28/2017  . S/P AVR 07/02/2017  . Eczema 01/01/2014  . Hoarseness or changing voice 07/24/2013  . PAF (paroxysmal atrial fibrillation) ([place]) 05/18/2013  . Chronic anticoagulation 05/18/2013  . Preop cardiovascular exam 05/18/2013  . Macular rash 08/10/2012  . Acute asthmatic bronchitis 05/05/2012  . Essential hypertension 06/17/2010  . SYNCOPE 06/14/2010  . DEPRESSION 02/11/2008  . Obstructive sleep apnea 02/11/2008  . Seasonal and perennial allergic rhinitis 02/11/2008  . Chronic bronchitis ([place]) 02/11/2008  . GERD (gastroesophageal reflux disease) 02/11/2008    Past Surgical History:  Procedure Laterality Date  . BENTALL PROCEDURE N/A 07/02/2017   Procedure: BENTALL PROCEDURE;  Surgeon: Ivin Poot, MD;  Location: [facility];  Service: Open Heart Surgery;  Laterality: N/A;  WITH CIRC ARREST  . CARDIAC SURGERY    . COLONOSCOPY    . CYSTOSCOPY W/ RETROGRADES Bilateral 06/15/2013   Procedure: CYSTOSCOPY WITH BILATERAL RETROGRADE PYELOGRAM  BLADDER  BIOPSY, PROSTATIC URETHRAL BIOPSY, ;  Surgeon: Molli Hazard, MD;  Location: [facility];  Service: Urology;  Laterality: Bilateral;  . ESOPHAGOGASTRODUODENOSCOPY (EGD) WITH PROPOFOL N/A 03/11/2018   Procedure: ESOPHAGOGASTRODUODENOSCOPY (EGD) WITH PROPOFOL;  Surgeon: Clarene Essex, MD;  Location: [facility];  Service: Endoscopy;  Laterality: N/A;  . LAPAROSCOPIC CHOLECYSTECTOMY  01-14-2001  . RIGHT/LEFT HEART CATH AND CORONARY ANGIOGRAPHY N/A 06/12/2017   Procedure: RIGHT/LEFT HEART CATH AND CORONARY ANGIOGRAPHY;  Surgeon: Larey Dresser, MD;  Location:  [facility];  Service: Cardiovascular;  Laterality: N/A;  . TEE WITHOUT CARDIOVERSION N/A 07/02/2017   Procedure: TRANSESOPHAGEAL ECHOCARDIOGRAM (TEE);  Surgeon: Prescott Gum, Collier Salina, MD;  Location: [facility];  Service: Open Heart Surgery;  Laterality: N/A;  . UMBILICAL HERNIA REPAIR  01-14-2008        Home Medications    Prior to Admission medications   Medication Sig Start Date End Date Taking? Authorizing Provider  acetaminophen (TYLENOL) 500 MG tablet Take 1,000 mg by mouth every 8 (eight) hours as needed for mild pain, fever or headache. Per bottle as needed   Yes [provider]  albuterol (VENTOLIN HFA) 108 (90 Base) MCG/ACT inhaler INHALE 2 PUFFS INTO LUNGS EVERY 6 HOURS AS NEEDED FOR WHEEZING OR SHORTNESS OF BREATH Patient taking differently: Inhale 2 puffs into the lungs every 6 (six) hours as needed for wheezing or shortness of breath.  06/17/18  Yes Young, Clinton D, MD  amoxicillin (AMOXIL) 500 MG capsule Take 2,000 mg by mouth See admin instructions. 1 hour prior to dental procedure   Yes [provider]  amoxicillin-clavulanate (AUGMENTIN) 875-125 MG tablet Take 1 tablet by mouth See admin instructions. Twice daily for one wesk as needed for diverticulitis flare   Yes [provider]  azelastine (ASTELIN) 137 MCG/SPRAY nasal spray Place 1-2 sprays into the nose 2 (two) times daily as needed for rhinitis or allergies.    Yes [provider]  benzonatate (TESSALON) 200 MG capsule Take 1 capsule by mouth every 8 hours as needed for cough 09/22/18  Yes Lauraine Rinne, NP  budesonide-formoterol (SYMBICORT) 160-4.5 MCG/ACT inhaler INHALE 2 PUFFS BY MOUTH TWICE DAILY 02/10/19  Yes Young, Tarri Fuller D, MD  cetirizine (ZYRTEC) 10 MG tablet Take 10 mg by mouth daily.   Yes [provider]  COLCRYS 0.6 MG tablet Take 0.6 mg by mouth daily.  01/29/12  Yes [provider]  dextromethorphan (DELSYM) 30 MG/5ML liquid Take 15 mg by mouth 2 (two) times  daily as needed for cough.    Yes [provider]  Dextromethorphan-guaiFENesin (MUCINEX DM MAXIMUM STRENGTH) 60-1200 MG TB12 Take 1 tablet by mouth 2 (two) times daily as needed (cough).    Yes [provider]  DHEA 50 MG TABS Take 50 mg by mouth daily with breakfast.    Yes [provider]  diphenhydrAMINE (BENADRYL) 25 MG tablet Take 50 mg by mouth every 6 (six) hours as needed (tongue swelling due to Perphenazine).   Yes [provider]  EPINEPHrine 0.3 mg/0.3 mL IJ SOAJ injection Inject 0.3 mLs (0.3 mg total) into the muscle as needed (for angioedema). 02/26/18  Yes Josue Hector, MD  famotidine (PEPCID) 20 MG tablet Take 20 mg by mouth daily as needed for heartburn or indigestion.    Yes [provider]  Ferrous Sulfate ([employer] SLOW RELEASE IRON PO) Take 45 mg by mouth 2 (two) times daily.   Yes [provider]  finasteride (PROSCAR) 5 MG  tablet Take 5 mg by mouth at bedtime.  01/14/14  Yes [provider]  levothyroxine (SYNTHROID, LEVOTHROID) 112 MCG tablet Take 112 mcg by mouth daily before breakfast.    Yes [provider]  liothyronine (CYTOMEL) 25 MCG tablet Take 25 mcg by mouth daily.    Yes [provider]  losartan (COZAAR) 50 MG tablet Take 1 tablet (50 mg total) by mouth daily. 11/08/18  Yes Ivin Poot, MD  metoprolol tartrate (LOPRESSOR) 50 MG tablet TAKE 1 TABLET(50 MG) BY MOUTH TWICE DAILY Patient taking differently: Take 50 mg by mouth 2 (two) times daily.  07/27/18  Yes Bhagat, Public house manager, PA  Multiple Vitamins-Minerals (ONE-A-DAY MENS HEALTH FORMULA PO) Take 1 tablet by mouth daily with breakfast.    Yes [provider]  omeprazole (PRILOSEC) 40 MG capsule Take 1 capsule (40 mg total) by mouth 2 (two) times daily. 10/31/16  Yes Josue Hector, MD  perphenazine (TRILAFON) 2 MG tablet Take 2 mg by mouth daily as needed (headaches or migraines).    Yes [provider]  rizatriptan  (MAXALT) 10 MG tablet Take 10 mg by mouth as needed for migraine. May repeat in 2 hours if needed   Yes [provider]  tamsulosin (FLOMAX) 0.4 MG CAPS capsule Take 0.4 mg by mouth daily after supper.   Yes [provider]  testosterone cypionate (DEPOTESTOTERONE CYPIONATE) 100 MG/ML injection Inject 200 mg into the muscle every Thursday. For IM use only    Yes [provider]  venlafaxine (EFFEXOR-XR) 150 MG 24 hr capsule Take 150 mg by mouth daily with breakfast.    Yes [provider]  XARELTO 20 MG TABS tablet TAKE 1 TABLET(20 MG) BY MOUTH DAILY WITH SUPPER Patient taking differently: Take 20 mg by mouth daily with supper.  11/18/18  Yes Josue Hector, MD    Family History Family History  Problem Relation Age of Onset  . Aortic aneurysm Mother 70       cause of death  . Other Father        motor vehicle accident  . Heart disease Other        family history    Social History Social History   Tobacco Use  . Smoking status: Former Smoker    Packs/day: 0.50    Years: 27.00    Pack years: 13.50    Types: Cigarettes    Last attempt to quit: 10/21/1979    Years since quitting: 39.3  . Smokeless tobacco: Never Used  Substance Use Topics  . Alcohol use: Yes    Alcohol/week: 21.0 standard drinks    Types: 21 Cans of beer per week    Comment: 4-5 beers a day  . Drug use: No     Allergies   Perphenazine; Amiodarone; Codeine; and Tramadol   Review of Systems Review of Systems  Constitutional: Positive for activity change. Negative for chills and fever.  HENT: Negative for congestion and facial swelling.   Eyes: Negative for discharge and visual disturbance.  Respiratory: Negative for shortness of breath.   Cardiovascular: Negative for chest pain and palpitations.  Gastrointestinal: Negative for abdominal pain, diarrhea and vomiting.  Musculoskeletal: Negative for arthralgias and myalgias.  Skin: Negative for color change and rash.   Neurological: Positive for speech difficulty. Negative for tremors, syncope and headaches.  Psychiatric/Behavioral: Negative for confusion and dysphoric mood.     Physical Exam Updated Vital Signs BP (!) 142/91   Pulse 69   Resp  19   SpO2 97%   Physical Exam Vitals signs and nursing note reviewed.  Constitutional:      Appearance: He is well-developed.  HENT:     Head: Normocephalic and atraumatic.  Eyes:     Pupils: Pupils are equal, round, and reactive to light.  Neck:     Musculoskeletal: Normal range of motion and neck supple.     Vascular: No JVD.  Cardiovascular:     Rate and Rhythm: Normal rate and regular rhythm.     Heart sounds: No murmur. No friction rub. No gallop.   Pulmonary:     Effort: No respiratory distress.     Breath sounds: No wheezing.  Abdominal:     General: There is no distension.     Tenderness: There is no guarding or rebound.  Musculoskeletal: Normal range of motion.  Skin:    Coloration: Skin is not pale.     Findings: No rash.  Neurological:     Mental Status: [name], place, and time.     Cranial Nerves: Cranial nerves are intact.     Sensory: Sensation is intact.     Motor: Motor function is intact.     Coordination: Coordination is intact.     Gait: Gait is intact.     Comments: No noted speech change, no noted deficit.  Able to name simple objects.   Psychiatric:        Behavior: Behavior normal.      ED Treatments / Results  Labs (all labs ordered are listed, but only abnormal results are displayed) Labs Reviewed  DIFFERENTIAL - Abnormal; Notable for the following components:      Result Value   Monocytes Absolute 1.1 (*)    All other components within normal limits  COMPREHENSIVE METABOLIC PANEL - Abnormal; Notable for the following components:   Glucose, Bld 105 (*)    Creatinine, Ser 1.25 (*)    Total Protein 6.2 (*)    All other components within normal limits  ETHANOL  PROTIME-INR   APTT  CBC  RAPID URINE DRUG SCREEN, HOSP PERFORMED  URINALYSIS, ROUTINE W REFLEX MICROSCOPIC  I-STAT CREATININE, ED    EKG EKG Interpretation  Date/Time:  Wednesday February 16 2019 15:35:41 EDT Ventricular Rate:  69 PR Interval:    QRS Duration: 125 QT Interval:  424 QTC Calculation: 455 R Axis:   -35 Text Interpretation:  Sinus rhythm Nonspecific intraventricular conduction delay No significant change since last tracing Confirmed by Deno Etienne [phone]) on 02/16/2019 3:43:34 PM   Radiology Ct Head Code Stroke Wo Contrast  Result Date: 02/16/2019 CLINICAL DATA:  Code stroke. Altered level of consciousness, slurred speech EXAM: CT HEAD WITHOUT CONTRAST TECHNIQUE: Contiguous axial images were obtained from the base of the skull through the vertex without intravenous contrast. COMPARISON:  None. FINDINGS: Brain: No evidence of acute infarction, hemorrhage, hydrocephalus, extra-axial collection or mass lesion/mass effect. Vascular: Negative for hyperdense vessel Skull: Negative Sinuses/Orbits: Mild mucosal edema paranasal sinuses.  Normal orbit Other: None ASPECTS ([place] Stroke Program Early CT Score) - Ganglionic level infarction (caudate, lentiform nuclei, internal capsule, insula, M1-M3 cortex): 7 - Supraganglionic infarction (M4-M6 cortex): 3 Total score (0-10 with 10 being normal): 10 IMPRESSION: 1. No acute intracranial abnormality 2. ASPECTS is 10 3. These results were called by telephone at the time of interpretation on 02/16/2019 at 4:01 pm to Dr. Leonie Man , who verbally acknowledged these results. Electronically Signed   By: Franchot Gallo  M.D.   On: 02/16/2019 16:01    Procedures Procedures (including critical care time)  Medications Ordered in ED Medications - No data to display   Initial Impression / Assessment and Plan / ED Course  I have reviewed the triage vital signs and the nursing notes.  Pertinent labs & imaging results that were available during my care of the patient  were reviewed by me and considered in my medical decision making (see chart for details).        68 yo M with a chief complaint of acute confusion.  He later told me that this does happen him from time to time.  States that this time he feels like it is going on longer than normal.  Having trouble remembering what is going on and having trouble expressing himself.  I do not appreciate this on my exam.  Made a code stroke for acute onset.   Patient was seen via video by Dr. Leonie Man, felt to not be a candidate for TPA with a very low NIH score as well as being already on anticoagulation.  He did recommend that the patient be admitted for this change in mental status and recommended an MRI and an EEG.  Will discuss with the hospitalist.  The patients results and plan were reviewed and discussed.   Any x-rays performed were independently reviewed by myself.   Differential diagnosis were considered with the presenting HPI.  Medications - No data to display  Vitals:   02/16/19 1600 02/16/19 1615  BP: 130/81 (!) 142/91  Pulse: 68 69  Resp: 12 19  SpO2: 96% 97%    Final diagnoses:  Acute confusion    Admission/ observation were discussed with the admitting physician, patient and/or family and they are comfortable with the plan.    Final Clinical Impressions(s) / ED Diagnoses   Final diagnoses:  Acute confusion    ED Discharge Orders    None       Deno Etienne, DO 02/16/19 1650

## 2019-02-16 NOTE — Consult Note (Signed)
Virtual Visit via Video Note  I connected with Grant [name] on 02/16/19  by a video enabled telemedicine application and verified that I am speaking with the correct person using two identifiers.  Location: Patient: in [facility] ER Rm 21 Provider: at Rm [id number] Ref MD : Dr Tyrone Nine   I discussed the limitations of evaluation and management by telemedicine and the availability of in person appointments. The patient expressed understanding and agreed to proceed.  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  The patient also apparently had some trouble speaking and was confused.  He denied headache but felt like a migraine was coming on.  He denies any slurred speech extremity weakness gait or balance problems.  He denies any prior history of strokes TIAs seizures or significant neurological problems.  He does have history of migraines but these are not quite frequent.  He has past medical history of asthma and chronic bronchitis, allergic rhinitis, obstructive sleep apnea, chronic cough (~2012)  Past medical history significant for A. fib on chronic anticoagulation, Depression,angioedema, aortic valve replacement, toxic encephalopathy, light sensitivity, chronic fatigue/fibromyalgia  Allergies amiodarone, codeine, tramadol Social history past history of smoking quit. Medication list Xarelto 20 mg daily, Effexor 150 mg once a day, testosterone injection once a week, Flomax 0.4 mg daily, Maxalt 10 mg as needed for migraine, Prilosec 40 mg twice daily, multivitamin, metoprolol 50 mg daily, losartan 50 mg daily, Cytomel 25 mcg daily, Synthroid 112 mcg daily, Xopenex inhaler as needed, Atrovent inhaler as needed, Proscar 5 mg daily.  Iron 45 mg twice daily, Pepcid 20 mg as needed.  Dextromethorphan 30 mg as needed, vitamin B12  1200 mg daily, Zyrtec 10 mg daily, Symbicort inhaler twice daily, benzonatate 200 mg every 8 hourly as needed Astelin 137 mcg nasal spray twice daily   Physical Exam   obese middle-aged Caucasian male not in distress. . Afebrile. Head is nontraumatic. Neck is supple without bruit.    Cardiac exam no murmur or gallop. Lungs are clear to auscultation. Distal pulses are well felt.  Neurological Exam ;  Awake  Alert oriented x 3. Normal speech and language.eye movements full without nystagmus.fundi were not visualized. Vision acuity and fields appear normal. Hearing is normal. Palatal movements are normal. Face symmetric. Tongue midline. Normal strength, tone, reflexes and coordination. Normal sensation. Gait deferred.  NIHSS 1 ( missed month )  Baseline MRS 0  Assessment and Plan: 68 year old Caucasian male with sudden onset episode of transient confusion and short-term memory difficulties of unclear etiology.  Possibilities include small infarct versus transient global amnesia or unwitnessed seizure with postictal confusion.   Patient is not a candidate for TPA since his neurological deficits are negligible and he is on Xarelto.  Patient is also not a candidate for thrombectomy due to having no deficits and clinically not having signs of LVO  Follow Up Instructions: I had a long discussion with the patient regarding his neurological presentation discussed differential diagnosis and plan for evaluation and treatment and answered questions.  I recommend we admit patient for observation check MRI scan of the brain to look for stroke or changes of transient global amnesia.  Check EEG for any seizure activity.  Continue Xarelto for stroke prevention.  Will consider adding further stroke  related testing if MRI suggestive of stroke otherwise patient can be discharged after the above test.  I also discussed the case with Dr. Tyrone Nine emergency room physician and answered questions.   I discussed the  assessment and treatment plan with the patient. The patient was provided an opportunity to ask questions and all were answered. The patient agreed with the plan and demonstrated an understanding of the instructions.   The patient was advised to call back or seek an in-person evaluation if the symptoms worsen or if the condition fails to improve as anticipated.  I provided 5minutes of non-face-to-face time during this encounter.   Antony Contras, MD

## 2019-02-17 ENCOUNTER — Observation Stay (HOSPITAL_COMMUNITY): Payer: Medicare Other

## 2019-02-17 ENCOUNTER — Other Ambulatory Visit: Payer: Self-pay

## 2019-02-17 DIAGNOSIS — R410 Disorientation, unspecified: Secondary | ICD-10-CM | POA: Diagnosis not present

## 2019-02-17 DIAGNOSIS — R413 Other amnesia: Secondary | ICD-10-CM | POA: Diagnosis not present

## 2019-02-17 DIAGNOSIS — I10 Essential (primary) hypertension: Secondary | ICD-10-CM | POA: Diagnosis not present

## 2019-02-17 DIAGNOSIS — G454 Transient global amnesia: Secondary | ICD-10-CM

## 2019-02-17 LAB — LIPID PANEL
Cholesterol: 185 mg/dL (ref 0–200)
HDL: 42 mg/dL (ref >40)
LDL Cholesterol: 96 mg/dL (ref 0–99)
Total CHOL/HDL Ratio: 4.4 RATIO
Triglycerides: 234 mg/dL — ABNORMAL HIGH (ref <150)
VLDL: 47 mg/dL — ABNORMAL HIGH (ref 0–40)

## 2019-02-17 LAB — HEMOGLOBIN A1C
Hgb A1c MFr Bld: 5.1 % (ref 4.8–5.6)
Mean Plasma Glucose: 99.67 mg/dL

## 2019-02-17 LAB — HIV ANTIBODY (ROUTINE TESTING W REFLEX): HIV Screen 4th Generation wRfx: NONREACTIVE

## 2019-02-17 MED ORDER — ALBUTEROL SULFATE 2 MG PO TABS
2.0000 mg | ORAL_TABLET | ORAL | Status: DC | PRN
  Filled 2019-02-17: qty 1

## 2019-02-17 NOTE — Evaluation (Signed)
Speech Language Pathology Evaluation Patient Details Name: Grant [name] MRN: [id number] DOB: 01-31-1951 Today's Date: 02/17/2019 Time: 3299-2426 SLP Time Calculation (min) (ACUTE ONLY): 25 min  Problem List:  Patient Active Problem List   Diagnosis Date Noted  . Confusion with non-focal neuro exam 02/16/2019  . Cough variant asthma vs UACS 11/15/2018  . Cough 10/27/2018  . Angioedema 03/07/2018  . Sepsis ([place]) 03/07/2018  . Asthma 03/07/2018  . Hypothyroidism 03/07/2018  . OSA on CPAP 03/07/2018  . BPH (benign prostatic hyperplasia) 03/07/2018  . Allergic reaction 01/21/2018  . Atrial fibrillation with RVR ([place]) 07/28/2017  . S/P AVR 07/02/2017  . Eczema 01/01/2014  . Hoarseness or changing voice 07/24/2013  . PAF (paroxysmal atrial fibrillation) ([place]) 05/18/2013  . Chronic anticoagulation 05/18/2013  . Preop cardiovascular exam 05/18/2013  . Macular rash 08/10/2012  . Acute asthmatic bronchitis 05/05/2012  . Essential hypertension 06/17/2010  . SYNCOPE 06/14/2010  . DEPRESSION 02/11/2008  . Obstructive sleep apnea 02/11/2008  . Seasonal and perennial allergic rhinitis 02/11/2008  . Chronic bronchitis ([place]) 02/11/2008  . GERD (gastroesophageal reflux disease) 02/11/2008   Past Medical History:  Past Medical History:  Diagnosis Date  . Allergic rhinitis   . Anxiety   . Arthritis   . Asthma   . Bladder tumor   . Chronic fatigue   . Depression    06/30/17 Pt denies being depressed, reports Effexor is taken for Chronic Fatigue   . Dyspnea   . Enlarged prostate   . Fibromyalgia   . GERD (gastroesophageal reflux disease)   . Headache   . History of chronic bronchitis   . History of migraine   . History of toxic encephalopathy   . Hypothyroidism   . OSA on CPAP    CPAP 14  . PAF (paroxysmal atrial fibrillation) ([place]) CARDIOLOGIST -- DR [name]   DX OCT 2013  . 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 Unspecified essential hypertension   . Urethral tumor    PROSTATIC    Past Surgical History:  Past Surgical History:  Procedure Laterality Date  . BENTALL PROCEDURE N/A 07/02/2017   Procedure: BENTALL PROCEDURE;  Surgeon: Ivin Poot, MD;  Location: [facility];  Service: Open Heart Surgery;  Laterality: N/A;  WITH CIRC ARREST  . CARDIAC SURGERY    . COLONOSCOPY    . CYSTOSCOPY W/ RETROGRADES Bilateral 06/15/2013   Procedure: CYSTOSCOPY WITH BILATERAL RETROGRADE PYELOGRAM  BLADDER BIOPSY, PROSTATIC URETHRAL BIOPSY, ;  Surgeon: Molli Hazard, MD;  Location[facility];  Service: Urology;  Laterality: Bilateral;  . ESOPHAGOGASTRODUODENOSCOPY (EGD) WITH PROPOFOL N/A 03/11/2018   Procedure: ESOPHAGOGASTRODUODENOSCOPY (EGD) WITH PROPOFOL;  Surgeon: Clarene Essex, MD;  Location: [facility];  Service: Endoscopy;  Laterality: N/A;  . LAPAROSCOPIC CHOLECYSTECTOMY  01-14-2001  . RIGHT/LEFT HEART CATH AND CORONARY ANGIOGRAPHY N/A 06/12/2017   Procedure: RIGHT/LEFT HEART CATH AND CORONARY ANGIOGRAPHY;  Surgeon: Larey Dresser, MD;  Location: [facility];  Service: Cardiovascular;  Laterality: N/A;  . TEE WITHOUT CARDIOVERSION N/A 07/02/2017   Procedure: TRANSESOPHAGEAL ECHOCARDIOGRAM (TEE);  Surgeon: Prescott Gum, Collier Salina, MD;  Location: [facility];  Service: Open Heart Surgery;  Laterality: N/A;  . UMBILICAL HERNIA REPAIR  01-14-2008   HPI:  Pt is a 68 y.o. male with medical history significant of HTN; afib, asthma with chronic cough, OSA on CPAP, hypothyroidism, and  Fibromyalgia who presented with acute onset of confusion. Patient was working in his shop on 02/16/19 and suddenly didn't know what he was  doing. MRI of the brain was negative.    Assessment / Plan / Recommendation Clinical Impression  Pt is currently on disability and reported that at least 12 year prior he was diagnosed with aphasia and has been experiencing cognitive deficits secondary to chronic fatigue. He stated that he stopped driving due to difficulty processing and attending if  there are too many distractions. He stated that his wife currently manages his medications and finances due to his variability in cognition and concerns regarding accuracy. Per the pt, he believes that his speech, language, and cognitive skills have returned to his baseline even though they are " by no means normal". He did require additional processing time to formulate sentences comprehend complex information. With regards to cognition, he demonstrated difficulty with memory which he stated is his baseline. Further acute skilled SLP services are not clinically indicated at this time since the pt appears to have returned to his baseline and will have adequate support upon discharge. Pt, and nursing were educated regarding results and recommendations; both parties verbalized understanding as well as agreement with plan of care.    SLP Assessment  SLP Recommendation/Assessment: Patient does not need any further Speech Lanaguage Pathology Services SLP Visit Diagnosis: Aphasia (R47.01)    Follow Up Recommendations  None    Frequency and Duration           SLP Evaluation Cognition  Overall Cognitive Status: History of cognitive impairments - at baseline Arousal/Alertness: Awake/alert Orientation Level: Oriented to place;Oriented to person;Oriented to situation(Oriented to month, year, not date or day) Attention: Sustained Sustained Attention: Appears intact(Serial 7s: 3/3) Memory: Impaired Memory Impairment: Retrieval deficit;Decreased recall of new information(Immediate: 3/3; Delayed: 1/3; With cues 1/2) Awareness: Appears intact       Comprehension  Auditory Comprehension Overall Auditory Comprehension: Appears within functional limits for tasks assessed Yes/No Questions: Within Functional Limits Commands: Within Functional Limits(2 & 3 step: 4/4) Conversation: Complex Visual Recognition/Discrimination Discrimination: Within Function Limits Reading Comprehension Reading Status: Not  tested    Expression Expression Primary Mode of Expression: Verbal Verbal Expression Overall Verbal Expression: Appears within functional limits for tasks assessed Initiation: No impairment Level of Generative/Spontaneous Verbalization: Conversation Repetition: No impairment(3/3) Naming: Impairment Confrontation: Within functional limits(10/10) Convergent: (Sentence completion: 5/5) Divergent: (Concrete category: 12 within 56minute) Written Expression Dominant Hand: Right   Oral / Motor  Motor Speech Overall Motor Speech: Appears within functional limits for tasks assessed Respiration: Within functional limits Phonation: Normal Resonance: Within functional limits Articulation: Within functional limitis Intelligibility: Intelligible Motor Planning: Witnin functional limits Motor Speech Errors: Not applicable   Kaisley Stiverson I. Hardin Negus, [place], [place] Office number [phone] Pager [place] 02/17/2019, 10:56 AM

## 2019-02-17 NOTE — Progress Notes (Signed)
STROKE TEAM PROGRESS NOTE   INTERVAL HISTORY Patient states he is doing well.  He denies any migraine or headache.  He is wearing sunglasses indoors due to extreme light sensitivity history MRI brain is negative for acute stroke or any hippocampal diffusion abnormalities.  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  EEG shows no seizure activity. Vitals:   02/17/19 0330 02/17/19 0530 02/17/19 0732 02/17/19 0737  BP: 135/80 140/85  (!) 147/89  Pulse:  (!) 58  (!) 57  Resp: 16 18  17   Temp: 98.2 F (36.8 C) 98.5 F (36.9 C)  98 F (36.7 C)  TempSrc: Oral Oral  Oral  SpO2: 96% 95% 92% 94%  Weight:      Height:        CBC:  Recent Labs  Lab 02/16/19 1535  WBC 9.0  NEUTROABS 6.5  HGB 14.7  HCT 43.0  MCV 95.3  PLT 081    Basic Metabolic Panel:  Recent Labs  Lab 02/16/19 1535 02/16/19 1544  NA 138  --   K 4.0  --   CL 104  --   CO2 25  --   GLUCOSE 105*  --   BUN 14  --   CREATININE 1.25* 1.20  CALCIUM 9.0  --    Lipid Panel:     Component Value Date/Time   CHOL 185 02/17/2019 0714   CHOL 184 05/05/2017 0931   TRIG 234 (H) 02/17/2019 0714   HDL 42 02/17/2019 0714   HDL 45 05/05/2017 0931   CHOLHDL 4.4 02/17/2019 0714   VLDL 47 (H) 02/17/2019 0714   LDLCALC 96 02/17/2019 0714   LDLCALC 94 05/05/2017 0931   HgbA1c:  Lab Results  Component Value Date   HGBA1C 5.1 02/17/2019   Urine Drug Screen:     Component Value Date/Time   LABOPIA NONE DETECTED 02/16/2019 1720   COCAINSCRNUR NONE DETECTED 02/16/2019 1720   LABBENZ NONE DETECTED 02/16/2019 1720   AMPHETMU NONE DETECTED 02/16/2019 1720   THCU NONE DETECTED 02/16/2019 1720   LABBARB NONE DETECTED 02/16/2019 1720    Alcohol Level     Component Value Date/Time   ETH <10 02/16/2019 1535    IMAGING Mr Brain Wo Contrast  Result Date: 02/16/2019 CLINICAL DATA:  Initial evaluation for transient global amnesia. Confusion. EXAM:  MRI HEAD WITHOUT CONTRAST MRA HEAD WITHOUT CONTRAST TECHNIQUE: Multiplanar, multiecho pulse sequences of the brain and surrounding structures were obtained without intravenous contrast. Angiographic images of the head were obtained using MRA technique without contrast. COMPARISON:  Prior CT from earlier the same day. FINDINGS: MRI HEAD FINDINGS Brain: Cerebral volume within normal limits for age. Minimal T2/FLAIR hyperintensity within the periventricular and deep white matter both cerebral hemispheres, most like related chronic micro vessel ischemic disease, mild for age. No abnormal foci of restricted diffusion to suggest acute or subacute ischemia. Gray-white matter differentiation maintained. No encephalomalacia to suggest chronic cortical infarction. No evidence for acute intracranial hemorrhage. Single chronic microhemorrhage noted within the posterior left frontal region (series 18, image 40), of doubtful significance in isolation. No other evidence for chronic intracranial hemorrhage. No mass lesion, midline shift or mass effect. No hydrocephalus. No extra-axial fluid collection. Pituitary gland suprasellar region normal. Midline structures intact. Vascular: Major intracranial vascular flow voids maintained. Skull and upper cervical spine: Craniocervical junction normal. Bone marrow signal intensity within normal limits. No scalp soft tissue abnormality. Sinuses/Orbits: Globes and orbital  soft tissues demonstrate no acute finding. Few small retention cyst noted within the sphenoid sinuses bilaterally. Paranasal sinuses are otherwise clear. Trace bilateral mastoid effusions, of doubtful significance. Inner ear structures normal. Other: None. MRA HEAD FINDINGS ANTERIOR CIRCULATION: Examination mildly degraded by motion artifact. Distal cervical segments of the internal carotid arteries are patent with symmetric antegrade flow. Distal cervical right ICA mildly tortuous. Petrous, cavernous, and supraclinoid  segments patent without hemodynamically significant stenosis or other abnormality. A1 segments widely patent. Normal anterior communicating artery. Anterior cerebral arteries patent to their distal aspects without hemodynamically significant stenosis. M1 segments demonstrate scattered atheromatous irregularity but are patent without flow-limiting stenosis. Subtle 3 mm focal outpouching at the left MCA bifurcation suspicious for a small aneurysm. Right MCA trifurcates. Distal MCA branches well perfused and fairly symmetric, although demonstrate small vessel atheromatous irregularity. POSTERIOR CIRCULATION: Vertebral arteries largely codominant and patent to the vertebrobasilar junction without stenosis. Posterior inferior cerebral arteries patent bilaterally. Basilar widely patent to its distal aspect without stenosis. Superior cerebral arteries patent bilaterally. Both of the posterior cerebral arteries primarily supplied via the basilar and are well perfused to their distal aspects. Scattered atheromatous irregularity within the PCAs bilaterally without hemodynamically significant or correctable stenosis. IMPRESSION: MRI HEAD IMPRESSION: 1. No acute intracranial infarct or other abnormality identified. 2. Age-appropriate cerebral atrophy with mild chronic small vessel ischemic disease. MRA HEAD IMPRESSION: 1. Negative intracranial MRA for large vessel occlusion. 2. Scattered small-vessel atherosclerotic change throughout the intracranial circulation. No hemodynamically significant or correctable stenosis identified. 3. 3 mm focal outpouching at the left MCA bifurcation, suspicious for aneurysm. Electronically Signed   By: Jeannine Boga M.D.   On: 02/16/2019 19:17   Mr Grant [name] ZO Contrast  Result Date: 02/16/2019 CLINICAL DATA:  Initial evaluation for transient global amnesia. Confusion. EXAM: MRI HEAD WITHOUT CONTRAST MRA HEAD WITHOUT CONTRAST TECHNIQUE: Multiplanar, multiecho pulse sequences of the  brain and surrounding structures were obtained without intravenous contrast. Angiographic images of the head were obtained using MRA technique without contrast. COMPARISON:  Prior CT from earlier the same day. FINDINGS: MRI HEAD FINDINGS Brain: Cerebral volume within normal limits for age. Minimal T2/FLAIR hyperintensity within the periventricular and deep white matter both cerebral hemispheres, most like related chronic micro vessel ischemic disease, mild for age. No abnormal foci of restricted diffusion to suggest acute or subacute ischemia. Gray-white matter differentiation maintained. No encephalomalacia to suggest chronic cortical infarction. No evidence for acute intracranial hemorrhage. Single chronic microhemorrhage noted within the posterior left frontal region (series 18, image 40), of doubtful significance in isolation. No other evidence for chronic intracranial hemorrhage. No mass lesion, midline shift or mass effect. No hydrocephalus. No extra-axial fluid collection. Pituitary gland suprasellar region normal. Midline structures intact. Vascular: Major intracranial vascular flow voids maintained. Skull and upper cervical spine: Craniocervical junction normal. Bone marrow signal intensity within normal limits. No scalp soft tissue abnormality. Sinuses/Orbits: Globes and orbital soft tissues demonstrate no acute finding. Few small retention cyst noted within the sphenoid sinuses bilaterally. Paranasal sinuses are otherwise clear. Trace bilateral mastoid effusions, of doubtful significance. Inner ear structures normal. Other: None. MRA HEAD FINDINGS ANTERIOR CIRCULATION: Examination mildly degraded by motion artifact. Distal cervical segments of the internal carotid arteries are patent with symmetric antegrade flow. Distal cervical right ICA mildly tortuous. Petrous, cavernous, and supraclinoid segments patent without hemodynamically significant stenosis or other abnormality. A1 segments widely patent.  Normal anterior communicating artery. Anterior cerebral arteries patent to their distal aspects without hemodynamically significant stenosis. M1 segments  demonstrate scattered atheromatous irregularity but are patent without flow-limiting stenosis. Subtle 3 mm focal outpouching at the left MCA bifurcation suspicious for a small aneurysm. Right MCA trifurcates. Distal MCA branches well perfused and fairly symmetric, although demonstrate small vessel atheromatous irregularity. POSTERIOR CIRCULATION: Vertebral arteries largely codominant and patent to the vertebrobasilar junction without stenosis. Posterior inferior cerebral arteries patent bilaterally. Basilar widely patent to its distal aspect without stenosis. Superior cerebral arteries patent bilaterally. Both of the posterior cerebral arteries primarily supplied via the basilar and are well perfused to their distal aspects. Scattered atheromatous irregularity within the PCAs bilaterally without hemodynamically significant or correctable stenosis. IMPRESSION: MRI HEAD IMPRESSION: 1. No acute intracranial infarct or other abnormality identified. 2. Age-appropriate cerebral atrophy with mild chronic small vessel ischemic disease. MRA HEAD IMPRESSION: 1. Negative intracranial MRA for large vessel occlusion. 2. Scattered small-vessel atherosclerotic change throughout the intracranial circulation. No hemodynamically significant or correctable stenosis identified. 3. 3 mm focal outpouching at the left MCA bifurcation, suspicious for aneurysm. Electronically Signed   By: Jeannine Boga M.D.   On: 02/16/2019 19:17   Ct Head Code Stroke Wo Contrast  Result Date: 02/16/2019 CLINICAL DATA:  Code stroke. Altered level of consciousness, slurred speech EXAM: CT HEAD WITHOUT CONTRAST TECHNIQUE: Contiguous axial images were obtained from the base of the skull through the vertex without intravenous contrast. COMPARISON:  None. FINDINGS: Brain: No evidence of acute  infarction, hemorrhage, hydrocephalus, extra-axial collection or mass lesion/mass effect. Vascular: Negative for hyperdense vessel Skull: Negative Sinuses/Orbits: Mild mucosal edema paranasal sinuses.  Normal orbit Other: None ASPECTS ([place] Stroke Program Early CT Score) - Ganglionic level infarction (caudate, lentiform nuclei, internal capsule, insula, M1-M3 cortex): 7 - Supraganglionic infarction (M4-M6 cortex): 3 Total score (0-10 with 10 being normal): 10 IMPRESSION: 1. No acute intracranial abnormality 2. ASPECTS is 10 3. These results were called by telephone at the time of interpretation on 02/16/2019 at 4:01 pm to Dr. Leonie Man , who verbally acknowledged these results. Electronically Signed   By: Franchot Gallo M.D.   On: 02/16/2019 16:01    PHYSICAL EXAM Obese middle-aged Caucasian male not in distress. . Afebrile. Head is nontraumatic. Neck is supple without bruit.    Cardiac exam no murmur or gallop. Lungs are clear to auscultation. Distal pulses are well felt. Neurological Exam ;  Awake  Alert oriented x 3. Normal speech and language.diminished recall 2/3.  Able to name 12 animals with 4 legs  Eye movements full without nystagmus.fundi were not visualized. Vision acuity and fields appear normal. Hearing is normal. Palatal movements are normal. Face symmetric. Tongue midline. Normal strength, tone, reflexes and coordination. Normal sensation. Gait deferred.  ASSESSMENT/PLAN Grant [name] is a 68 y.o. male with history of A. fib on chronic anticoagulation, depression,angioedema, aortic valve replacement, toxic encephalopathy, light sensitivity, chronic fatigue/fibromyalgia and migraines presenting with trouble speaking and confusion.   TGA:  Acute onset confusion in setting of known chronic fatigue syndrome  Code Stroke CT head No acute stroke. ASPECTS 10.     MRI no acute stroke.  Age-appropriate atrophy and small vessel disease  MRA no LVO.  Scattered atherosclerosis throughout.   Left MCA 3 mm per outpouching suspicious for aneurysm.  EEG pending  LDL 96  HgbA1c 5.1  Xarelto for VTE prophylaxis  Xarelto (rivaroxaban) daily prior to admission, now on aspirin 325 mg daily and Xarelto (rivaroxaban) daily.  No indication for additional aspirin.  Continue Xarelto alone at discharge. Order adjusted.  Therapy recommendations: No therapy  needs  Disposition: Return home  Stroke team will sign off.  No indication for neurologic follow-up at this time  Atrial Fibrillation  Home anticoagulation:  Xarelto (rivaroxaban) daily continued in the hospital  Now on aspirin 325 . Continue Xarelto (rivaroxaban) daily alone at discharge   Hypertension  Stable . BP goal normotensive from stroke perspective  Hyperlipidemia  Home meds: No statin  LDL 96, at goal < 100 for non-stroke patients   Other Stroke Risk Factors  Advanced age  Former cigarette smoker  ETOH use, advised to drink no more than 2 drink(s) a day  Obesity, Body mass index is 37.26 kg/m., recommend weight loss, diet and exercise as appropriate   Migraines  Obstructive sleep apnea, on CPAP at home  Aortic valve replacement  Other Active Problems  Fibromyalgia  Hospital day # 0  I have personally obtained history,examined this patient, reviewed notes, independently viewed imaging studies, participated in medical decision making and plan of care.ROS completed by me personally and pertinent positives fully documented  I have made any additions or clarifications directly to the above note.  He presented with transient episode of confusion and short-term memory difficulties of unclear etiology.  Negative neuroimaging and EEG studies would suggest transient global amnesia or typical migraine more likely.  Continue Xarelto for his atrial fibrillation and aggressive risk factor modification.  Stroke team will sign off.  Kindly call for questions.  Greater than 50% time during this 25-minute visit was  spent on counseling and coordination of care about his episode of memory loss and confusion and discussion about stroke and migraine prevention and treatment  Antony Contras, MD Medical Director [place] Pager: [phone] 02/17/2019 2:18 PM   To contact Stroke Continuity provider, please refer to [email or web address] After hours, contact General Neurology

## 2019-02-17 NOTE — Progress Notes (Signed)
IV and tele removed. Pt discharged home by wife. No further questions or concerns. Discharge instructions given to patient

## 2019-02-17 NOTE — Discharge Summary (Signed)
Physician Discharge Summary  JAIVION KINGSLEY KDT:[id number] DOB: Dec 04, 1950 DOA: 02/16/2019  PCP: Shirline Frees, MD  Admit date: 02/16/2019 Discharge date: 02/17/2019  Time spent: 35 minutes  Recommendations for Outpatient Follow-up:  PCP in 1 week [facility] in 1 month   Discharge Diagnoses:  Principal Problem:   Confusion with non-focal neuro exam   Morbid Obesity   Essential hypertension   PAF (paroxysmal atrial fibrillation) (HCC)   Chronic anticoagulation   Hypothyroidism   OSA on CPAP   Discharge Condition: stable  Diet recommendation: heart healthy  Filed Weights   02/16/19 2130  Weight: 117.8 kg    History of present illness:  HEZIKIAH RETZLOFF is a 68 y.o. male with medical history significant of HTN; afib on Xarelto; asthma with chronic cough; OSA on CPAP; hypothyroidism; and  Fibromyalgia presenting with acute onset of confusion.  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  His wife walked up around that time and he didn't know what he was doing for a period of time.  He does have chronic fatigue "and sometimes I just go away for a minute."  This has happened in the past.  "The more people talk to me the less I listen" on a regular basis  Hospital Course:   Acute onset of confusion -pt was admitted following a transient episode of confusion yesterday, this resolved -Neurology consulted and recommended overnight observation with MRI and EEG, MRI MRI no acute stroke.  Age-appropriate atrophy and small vessel disease MRA no LVO.  Scattered atherosclerosis, Left MCA 3 mm per outpouching suspicious for aneurysm -EEG was negative for epileptiform activity -pt improved and was back to baseline -at this time suspect transient global amnesia vs related to OSA/chronic fatigue etc -Followed by Neurology this admission, continued on xarelto -Pt/Ot eval completed and discharged him in a stable condition,  -advised weight loss,  compliance with CPAP, avoidance of sedating meds etc -advised FU with Neurology in 1 month  HTN -resumed cozaar and BB  Hyperglycemia -Mildly elevated glucose with recurrent tests -hba1c was 5.1  Afib on Xarelto -continue lopressir -Continue Xarelto  Hypothyroidism -continue synthroid  OSA on CPAP -continue CPAP  Discharge Exam: Vitals:   02/17/19 0737 02/17/19 1151  BP: (!) 147/89 (!) 155/97  Pulse: (!) 57 71  Resp: 17 17  Temp: 98 F (36.7 C) 97.7 F (36.5 C)  SpO2: 94% 96%    General: AAOx3 Cardiovascular: S1S2/RRR Respiratory: CTAB  Discharge Instructions   Discharge Instructions    Diet - low sodium heart healthy   Complete by:  As directed    Increase activity slowly   Complete by:  As directed      Allergies as of 02/17/2019      Reactions   Perphenazine Other (See Comments)   Tongue swelling   Amiodarone Swelling   Codeine Itching   Tramadol Other (See Comments)   dellusion      Medication List    STOP taking these medications   perphenazine 2 MG tablet Commonly known as:  TRILAFON     TAKE these medications   acetaminophen 500 MG tablet Commonly known as:  TYLENOL Take 1,000 mg by mouth every 8 (eight) hours as needed for mild pain, fever or headache. Per bottle as needed   albuterol 108 (90 Base) MCG/ACT inhaler Commonly known as:  Ventolin HFA INHALE 2 PUFFS INTO LUNGS EVERY 6 HOURS AS NEEDED FOR WHEEZING OR SHORTNESS OF BREATH  What changed:    how much to take  how to take this  when to take this  reasons to take this  additional instructions   amoxicillin 500 MG capsule Commonly known as:  AMOXIL Take 2,000 mg by mouth See admin instructions. 1 hour prior to dental procedure   azelastine 0.1 % nasal spray Commonly known as:  ASTELIN Place 1-2 sprays into the nose 2 (two) times daily as needed for rhinitis or allergies.   benzonatate 200 MG capsule Commonly known as:  TESSALON Take 1 capsule by mouth every 8  hours as needed for cough   budesonide-formoterol 160-4.5 MCG/ACT inhaler Commonly known as:  SYMBICORT INHALE 2 PUFFS BY MOUTH TWICE DAILY   cetirizine 10 MG tablet Commonly known as:  ZYRTEC Take 10 mg by mouth daily.   Colcrys 0.6 MG tablet Generic drug:  colchicine Take 0.6 mg by mouth daily.   [employer] SLOW RELEASE IRON PO Take 45 mg by mouth 2 (two) times daily.   Delsym 30 MG/5ML liquid Generic drug:  dextromethorphan Take 15 mg by mouth 2 (two) times daily as needed for cough.   DHEA 50 MG Tabs Take 50 mg by mouth daily with breakfast.   diphenhydrAMINE 25 MG tablet Commonly known as:  BENADRYL Take 50 mg by mouth every 6 (six) hours as needed (tongue swelling due to Perphenazine).   EPINEPHrine 0.3 mg/0.3 mL Soaj injection Commonly known as:  EPI-PEN Inject 0.3 mLs (0.3 mg total) into the muscle as needed (for angioedema).   famotidine 20 MG tablet Commonly known as:  PEPCID Take 20 mg by mouth daily as needed for heartburn or indigestion.   finasteride 5 MG tablet Commonly known as:  PROSCAR Take 5 mg by mouth at bedtime.   levothyroxine 112 MCG tablet Commonly known as:  SYNTHROID Take 112 mcg by mouth daily before breakfast.   liothyronine 25 MCG tablet Commonly known as:  CYTOMEL Take 25 mcg by mouth daily.   losartan 50 MG tablet Commonly known as:  COZAAR Take 1 tablet (50 mg total) by mouth daily.   metoprolol tartrate 50 MG tablet Commonly known as:  LOPRESSOR TAKE 1 TABLET(50 MG) BY MOUTH TWICE DAILY What changed:  See the new instructions.   Mucinex DM Maximum Strength 60-1200 MG Tb12 Take 1 tablet by mouth 2 (two) times daily as needed (cough).   omeprazole 40 MG capsule Commonly known as:  PRILOSEC Take 1 capsule (40 mg total) by mouth 2 (two) times daily.   ONE-A-DAY MENS HEALTH FORMULA PO Take 1 tablet by mouth daily with breakfast.   rizatriptan 10 MG tablet Commonly known as:  MAXALT Take 10 mg by mouth as needed for migraine.  May repeat in 2 hours if needed   tamsulosin 0.4 MG Caps capsule Commonly known as:  FLOMAX Take 0.4 mg by mouth daily after supper.   testosterone cypionate 100 MG/ML injection Commonly known as:  DEPOTESTOTERONE CYPIONATE Inject 200 mg into the muscle every Thursday. For IM use only   venlafaxine XR 150 MG 24 hr capsule Commonly known as:  EFFEXOR-XR Take 150 mg by mouth daily with breakfast.   Xarelto 20 MG Tabs tablet Generic drug:  rivaroxaban TAKE 1 TABLET(20 MG) BY MOUTH DAILY WITH SUPPER What changed:  See the new instructions.      Allergies  Allergen Reactions  . Perphenazine Other (See Comments)    Tongue swelling  . Amiodarone Swelling  . Codeine Itching  . Tramadol Other (See Comments)  dellusion    Follow-up Information    Shirline Frees, MD. Schedule an appointment as soon as possible for a visit in 1 week(s).   Specialty:  Family Medicine Contact information: [place] [address] [phone]            The results of significant diagnostics from this hospitalization (including imaging, microbiology, ancillary and laboratory) are listed below for reference.    Significant Diagnostic Studies: Mr Brain Wo Contrast  Result Date: 02/16/2019 CLINICAL DATA:  Initial evaluation for transient global amnesia. Confusion. EXAM: MRI HEAD WITHOUT CONTRAST MRA HEAD WITHOUT CONTRAST TECHNIQUE: Multiplanar, multiecho pulse sequences of the brain and surrounding structures were obtained without intravenous contrast. Angiographic images of the head were obtained using MRA technique without contrast. COMPARISON:  Prior CT from earlier the same day. FINDINGS: MRI HEAD FINDINGS Brain: Cerebral volume within normal limits for age. Minimal T2/FLAIR hyperintensity within the periventricular and deep white matter both cerebral hemispheres, most like related chronic micro vessel ischemic disease, mild for age. No abnormal foci of restricted  diffusion to suggest acute or subacute ischemia. Gray-white matter differentiation maintained. No encephalomalacia to suggest chronic cortical infarction. No evidence for acute intracranial hemorrhage. Single chronic microhemorrhage noted within the posterior left frontal region (series 18, image 40), of doubtful significance in isolation. No other evidence for chronic intracranial hemorrhage. No mass lesion, midline shift or mass effect. No hydrocephalus. No extra-axial fluid collection. Pituitary gland suprasellar region normal. Midline structures intact. Vascular: Major intracranial vascular flow voids maintained. Skull and upper cervical spine: Craniocervical junction normal. Bone marrow signal intensity within normal limits. No scalp soft tissue abnormality. Sinuses/Orbits: Globes and orbital soft tissues demonstrate no acute finding. Few small retention cyst noted within the sphenoid sinuses bilaterally. Paranasal sinuses are otherwise clear. Trace bilateral mastoid effusions, of doubtful significance. Inner ear structures normal. Other: None. MRA HEAD FINDINGS ANTERIOR CIRCULATION: Examination mildly degraded by motion artifact. Distal cervical segments of the internal carotid arteries are patent with symmetric antegrade flow. Distal cervical right ICA mildly tortuous. Petrous, cavernous, and supraclinoid segments patent without hemodynamically significant stenosis or other abnormality. A1 segments widely patent. Normal anterior communicating artery. Anterior cerebral arteries patent to their distal aspects without hemodynamically significant stenosis. M1 segments demonstrate scattered atheromatous irregularity but are patent without flow-limiting stenosis. Subtle 3 mm focal outpouching at the left MCA bifurcation suspicious for a small aneurysm. Right MCA trifurcates. Distal MCA branches well perfused and fairly symmetric, although demonstrate small vessel atheromatous irregularity. POSTERIOR CIRCULATION:  Vertebral arteries largely codominant and patent to the vertebrobasilar junction without stenosis. Posterior inferior cerebral arteries patent bilaterally. Basilar widely patent to its distal aspect without stenosis. Superior cerebral arteries patent bilaterally. Both of the posterior cerebral arteries primarily supplied via the basilar and are well perfused to their distal aspects. Scattered atheromatous irregularity within the PCAs bilaterally without hemodynamically significant or correctable stenosis. IMPRESSION: MRI HEAD IMPRESSION: 1. No acute intracranial infarct or other abnormality identified. 2. Age-appropriate cerebral atrophy with mild chronic small vessel ischemic disease. MRA HEAD IMPRESSION: 1. Negative intracranial MRA for large vessel occlusion. 2. Scattered small-vessel atherosclerotic change throughout the intracranial circulation. No hemodynamically significant or correctable stenosis identified. 3. 3 mm focal outpouching at the left MCA bifurcation, suspicious for aneurysm. Electronically Signed   By: Jeannine Boga M.D.   On: 02/16/2019 19:17   Mr [name] WP Contrast  Result Date: 02/16/2019 CLINICAL DATA:  Initial evaluation for transient global amnesia. Confusion. EXAM: MRI HEAD WITHOUT  CONTRAST MRA HEAD WITHOUT CONTRAST TECHNIQUE: Multiplanar, multiecho pulse sequences of the brain and surrounding structures were obtained without intravenous contrast. Angiographic images of the head were obtained using MRA technique without contrast. COMPARISON:  Prior CT from earlier the same day. FINDINGS: MRI HEAD FINDINGS Brain: Cerebral volume within normal limits for age. Minimal T2/FLAIR hyperintensity within the periventricular and deep white matter both cerebral hemispheres, most like related chronic micro vessel ischemic disease, mild for age. No abnormal foci of restricted diffusion to suggest acute or subacute ischemia. Gray-white matter differentiation maintained. No encephalomalacia  to suggest chronic cortical infarction. No evidence for acute intracranial hemorrhage. Single chronic microhemorrhage noted within the posterior left frontal region (series 18, image 40), of doubtful significance in isolation. No other evidence for chronic intracranial hemorrhage. No mass lesion, midline shift or mass effect. No hydrocephalus. No extra-axial fluid collection. Pituitary gland suprasellar region normal. Midline structures intact. Vascular: Major intracranial vascular flow voids maintained. Skull and upper cervical spine: Craniocervical junction normal. Bone marrow signal intensity within normal limits. No scalp soft tissue abnormality. Sinuses/Orbits: Globes and orbital soft tissues demonstrate no acute finding. Few small retention cyst noted within the sphenoid sinuses bilaterally. Paranasal sinuses are otherwise clear. Trace bilateral mastoid effusions, of doubtful significance. Inner ear structures normal. Other: None. MRA HEAD FINDINGS ANTERIOR CIRCULATION: Examination mildly degraded by motion artifact. Distal cervical segments of the internal carotid arteries are patent with symmetric antegrade flow. Distal cervical right ICA mildly tortuous. Petrous, cavernous, and supraclinoid segments patent without hemodynamically significant stenosis or other abnormality. A1 segments widely patent. Normal anterior communicating artery. Anterior cerebral arteries patent to their distal aspects without hemodynamically significant stenosis. M1 segments demonstrate scattered atheromatous irregularity but are patent without flow-limiting stenosis. Subtle 3 mm focal outpouching at the left MCA bifurcation suspicious for a small aneurysm. Right MCA trifurcates. Distal MCA branches well perfused and fairly symmetric, although demonstrate small vessel atheromatous irregularity. POSTERIOR CIRCULATION: Vertebral arteries largely codominant and patent to the vertebrobasilar junction without stenosis. Posterior inferior  cerebral arteries patent bilaterally. Basilar widely patent to its distal aspect without stenosis. Superior cerebral arteries patent bilaterally. Both of the posterior cerebral arteries primarily supplied via the basilar and are well perfused to their distal aspects. Scattered atheromatous irregularity within the PCAs bilaterally without hemodynamically significant or correctable stenosis. IMPRESSION: MRI HEAD IMPRESSION: 1. No acute intracranial infarct or other abnormality identified. 2. Age-appropriate cerebral atrophy with mild chronic small vessel ischemic disease. MRA HEAD IMPRESSION: 1. Negative intracranial MRA for large vessel occlusion. 2. Scattered small-vessel atherosclerotic change throughout the intracranial circulation. No hemodynamically significant or correctable stenosis identified. 3. 3 mm focal outpouching at the left MCA bifurcation, suspicious for aneurysm. Electronically Signed   By: Jeannine Boga M.D.   On: 02/16/2019 19:17   Ct Head Code Stroke Wo Contrast  Result Date: 02/16/2019 CLINICAL DATA:  Code stroke. Altered level of consciousness, slurred speech EXAM: CT HEAD WITHOUT CONTRAST TECHNIQUE: Contiguous axial images were obtained from the base of the skull through the vertex without intravenous contrast. COMPARISON:  None. FINDINGS: Brain: No evidence of acute infarction, hemorrhage, hydrocephalus, extra-axial collection or mass lesion/mass effect. Vascular: Negative for hyperdense vessel Skull: Negative Sinuses/Orbits: Mild mucosal edema paranasal sinuses.  Normal orbit Other: None ASPECTS ([place] Stroke Program Early CT Score) - Ganglionic level infarction (caudate, lentiform nuclei, internal capsule, insula, M1-M3 cortex): 7 - Supraganglionic infarction (M4-M6 cortex): 3 Total score (0-10 with 10 being normal): 10 IMPRESSION: 1. No acute intracranial abnormality 2. ASPECTS is 10  3. These results were called by telephone at the time of interpretation on 02/16/2019 at 4:01 pm  to Dr. Leonie Man , who verbally acknowledged these results. Electronically Signed   By: Franchot Gallo M.D.   On: 02/16/2019 16:01    Microbiology: No results found for this or any previous visit (from the past 240 hour(s)).   Labs: Basic Metabolic Panel: Recent Labs  Lab 02/16/19 1535 02/16/19 1544  NA 138  --   K 4.0  --   CL 104  --   CO2 25  --   GLUCOSE 105*  --   BUN 14  --   CREATININE 1.25* 1.20  CALCIUM 9.0  --    Liver Function Tests: Recent Labs  Lab 02/16/19 1535  AST 26  ALT 37  ALKPHOS 60  BILITOT 0.6  PROT 6.2*  ALBUMIN 3.8   No results for input(s): LIPASE, AMYLASE in the last 168 hours. No results for input(s): AMMONIA in the last 168 hours. CBC: Recent Labs  Lab 02/16/19 1535  WBC 9.0  NEUTROABS 6.5  HGB 14.7  HCT 43.0  MCV 95.3  PLT 202   Cardiac Enzymes: No results for input(s): CKTOTAL, CKMB, CKMBINDEX, TROPONINI in the last 168 hours. BNP: BNP (last 3 results) Recent Labs    03/08/18 1755  BNP 466.5*    ProBNP (last 3 results) No results for input(s): PROBNP in the last 8760 hours.  CBG: No results for input(s): GLUCAP in the last 168 hours.     Signed:  Domenic Polite MD.  [facility] 02/17/2019, 3:49 PM

## 2019-02-17 NOTE — TOC Transition Note (Signed)
Transition of Care [facility]) - CM/SW Discharge Note   Patient Details  Name: Grant [name] MRN: [id number] Date of Birth: 05/13/1951  Transition of Care [facility]) CM/SW Contact:  Pollie Friar, RN Phone Number: 02/17/2019, 4:13 PM   Clinical Narrative:    Pt discharging home with self care. Pt has transport home.   Final next level of care: Home/Self Care Barriers to Discharge: Barriers Resolved   Patient Goals and CMS Choice Patient states their goals for this hospitalization and ongoing recovery are:: to get home ASAP       Discharge Placement                       Discharge Plan and Services                                     Social Determinants of Health (SDOH) Interventions     Readmission Risk Interventions No flowsheet data found.

## 2019-02-17 NOTE — Procedures (Signed)
  [place] A. Merlene Laughter, MD     [email or web address]           HISTORY: The patient is 68 year old presents with episode of confusion and altered mental status.  The studies been done to evaluate for seizure as the etiology.  MEDICATIONS:  Current Facility-Administered Medications:  .  acetaminophen (TYLENOL) tablet 650 mg, 650 mg, Oral, Q4H PRN, 650 mg at 02/17/19 0832 **OR** acetaminophen (TYLENOL) solution 650 mg, 650 mg, Per Tube, Q4H PRN **OR** acetaminophen (TYLENOL) suppository 650 mg, 650 mg, Rectal, Q4H PRN, Karmen Bongo, MD .  albuterol (PROVENTIL) tablet 2 mg, 2 mg, Oral, Q4H PRN, Domenic Polite, MD .  colchicine tablet 0.6 mg, 0.6 mg, Oral, Daily, Karmen Bongo, MD, 0.6 mg at 02/17/19 0819 .  famotidine (PEPCID) tablet 20 mg, 20 mg, Oral, Daily PRN, Karmen Bongo, MD .  finasteride (PROSCAR) tablet 5 mg, 5 mg, Oral, QHS, Karmen Bongo, MD, 5 mg at 02/16/19 2226 .  levothyroxine (SYNTHROID) tablet 112 mcg, 112 mcg, Oral, Q0600, Karmen Bongo, MD, 112 mcg at 02/17/19 0557 .  liothyronine (CYTOMEL) tablet 25 mcg, 25 mcg, Oral, Daily, Karmen Bongo, MD, 25 mcg at 02/17/19 [phone] .  loratadine (CLARITIN) tablet 10 mg, 10 mg, Oral, Daily, Karmen Bongo, MD, 10 mg at 02/17/19 0819 .  mometasone-formoterol (DULERA) 200-5 MCG/ACT inhaler 2 puff, 2 puff, Inhalation, BID, Karmen Bongo, MD, 2 puff at 02/17/19 0732 .  pantoprazole (PROTONIX) EC tablet 40 mg, 40 mg, Oral, BID, Karmen Bongo, MD, 40 mg at 02/17/19 0819 .  rivaroxaban (XARELTO) tablet 20 mg, 20 mg, Oral, Q supper, Karmen Bongo, MD .  senna-docusate (Senokot-S) tablet 1 tablet, 1 tablet, Oral, QHS PRN, Karmen Bongo, MD .  venlafaxine XR [facility]) 24 hr capsule 150 mg, 150 mg, Oral, Q breakfast, Karmen Bongo, MD, 150 mg at 02/17/19 0818     ANALYSIS: A 16 channel recording using standard 10 20 measurements is conducted for 21 minutes.  There is a well-formed posterior dominant  rhythm of 9-9.5 Hz which attenuates with eye opening.  There is beta activity observed in frontal areas.  The recording transitions to drowsy architecture.  Photic stimulation and hyperventilation are not conducted.  There is no focal or lateralized slowing.  There is no epileptiform activity is observed.   IMPRESSION: 1.  This is a normal recording of the awake and drowsy states.      Macon Sandiford A. Merlene Laughter, M.D.  Diplomate, Tax adviser of Psychiatry and Neurology ( Neurology).

## 2019-02-17 NOTE — TOC Initial Note (Signed)
Transition of Care [facility]) - Initial/Assessment Note    Patient Details  Name: Grant [name] MRN: [id number] Date of Birth: 1951/04/08  [facility] CM/SW Contact:    Pollie Friar, RN Phone Number: 02/17/2019, 12:07 PM  Clinical Narrative:                 Wife does his medications at home. He denies any issues. Wife provides transportation.  Expected Discharge Plan: Home/Self Care Barriers to Discharge: Continued Medical Work up   Patient Goals and CMS Choice Patient states their goals for this hospitalization and ongoing recovery are:: to get home ASAP       Expected Discharge Plan and Services Expected Discharge Plan: Home/Self Care       Living arrangements for the past 2 months: Single Family Home(one level) Expected Discharge Date: 02/18/19                                    Prior Living Arrangements/Services Living arrangements for the past 2 months: Single Family Home(one level) Lives with:: Spouse Patient language and need for interpreter reviewed:: Yes(no needs) Do you feel safe going back to the place where you live?: Yes      Need for Family Participation in Patient Care: No (Comment) Care giver support system in place?: Yes (comment)(wife available 24 hours a day) Current home services: DME(shower seat) Criminal Activity/Legal Involvement Pertinent to Current Situation/Hospitalization: No - Comment as needed  Activities of Daily Living Home Assistive Devices/Equipment: None ADL Screening (condition at time of admission) Patient's cognitive ability adequate to safely complete daily activities?: Yes Is the patient deaf or have difficulty hearing?: No Does the patient have difficulty seeing, even when wearing glasses/contacts?: No Does the patient have difficulty concentrating, remembering, or making decisions?: No Patient able to express need for assistance with ADLs?: Yes Does the patient have difficulty dressing or bathing?:  No Independently performs ADLs?: Yes (appropriate for developmental age) Does the patient have difficulty walking or climbing stairs?: No Weakness of Legs: None Weakness of Arms/Hands: Both  Permission Sought/Granted                  Emotional Assessment Appearance:: Appears stated age Attitude/Demeanor/Rapport: Complaining Affect (typically observed): Accepting, Frustrated(wanting to go home ASAP) Orientation: : Oriented to Self, Oriented to Place, Oriented to  Time, Oriented to Situation   Psych Involvement: No (comment)  Admission diagnosis:  Acute confusion [R41.0] Patient Active Problem List   Diagnosis Date Noted  . Confusion with non-focal neuro exam 02/16/2019  . Cough variant asthma vs UACS 11/15/2018  . Cough 10/27/2018  . Angioedema 03/07/2018  . Sepsis ([place]) 03/07/2018  . Asthma 03/07/2018  . Hypothyroidism 03/07/2018  . OSA on CPAP 03/07/2018  . BPH (benign prostatic hyperplasia) 03/07/2018  . Allergic reaction 01/21/2018  . Atrial fibrillation with RVR ([place]) 07/28/2017  . S/P AVR 07/02/2017  . Eczema 01/01/2014  . Hoarseness or changing voice 07/24/2013  . PAF (paroxysmal atrial fibrillation) ([place]) 05/18/2013  . Chronic anticoagulation 05/18/2013  . Preop cardiovascular exam 05/18/2013  . Macular rash 08/10/2012  . Acute asthmatic bronchitis 05/05/2012  . Essential hypertension 06/17/2010  . SYNCOPE 06/14/2010  . DEPRESSION 02/11/2008  . Obstructive sleep apnea 02/11/2008  . Seasonal and perennial allergic rhinitis 02/11/2008  . Chronic bronchitis ([place]) 02/11/2008  . GERD (gastroesophageal reflux disease) 02/11/2008   PCP:  Shirline Frees, MD Pharmacy:  [facility], [address] [place] [address] Phone: [phone] Fax: [phone]     Social Determinants of Health (SDOH) Interventions    Readmission Risk Interventions No flowsheet  data found.

## 2019-02-17 NOTE — Progress Notes (Signed)
EEG completed, results pending. 

## 2019-02-17 NOTE — Progress Notes (Signed)
Nutrition Brief Note  RD working remotely. RD consulted for obesity. Pt is currently on a heart healthy diet. Meal completion 100%. Appetite good. Pt with no significant weight loss per weight records. Noted triglyceride lab elevated at 234 mg/dL. Per notes, pt with baseline cognitive deficits and difficulty with memory. RD to provide diet education handout "Heart Healthy Nutrition Therapy" from the Academy of [employer] Dietetics Manual to patient and wife via mail. RD contact information given. No further nutrition interventions at this time. Re-consult as needed.   Corrin Parker, MS, RD, LDN Pager # [phone] After hours/ weekend pager # [phone]

## 2019-02-17 NOTE — Evaluation (Signed)
Physical Therapy Evaluation Patient Details Name: Grant [name] MRN: [id number] DOB: 1950/12/03 Today's Date: 02/17/2019   History of Present Illness  Pt is a 68 y.o. M with significant PMH of HTN, afib, asthma, OSA, and fibromyalgia who presents with acute onset of confusion. MRI negative for acute abnormality. Thought to be manifestation of chronic fatigue syndrome.  Clinical Impression  Patient evaluated by Physical Therapy with no further acute PT needs identified. Pt denies residual symptoms that are different from his baseline. On PT evaluation, pt ambulating hallway distances independently and negotiated 4 steps to prepare for discharge home. Educated pt re: BEFAST symptoms, although pt reports he has does have difficulty differentiating stroke symptoms from his chronic fatigue symptoms. All education has been completed and the patient has no further questions. No follow-up Physical Therapy or equipment needs. PT is signing off. Thank you for this referral.     Follow Up Recommendations No PT follow up    Equipment Recommendations  None recommended by PT    Recommendations for Other Services       Precautions / Restrictions Precautions Precautions: None Restrictions Weight Bearing Restrictions: No      Mobility  Bed Mobility Overal bed mobility: Independent                Transfers Overall transfer level: Independent Equipment used: None                Ambulation/Gait Ambulation/Gait assistance: Independent Gait Distance (Feet): 200 Feet Assistive device: None Gait Pattern/deviations: WFL(Within Functional Limits)        Stairs Stairs: Yes Stairs assistance: Independent Stair Management: No rails      Wheelchair Mobility    Modified Rankin (Stroke Patients Only)       Balance Overall balance assessment: Independent                                           Pertinent Vitals/Pain Pain Assessment: No/denies pain     Home Living Family/patient expects to be discharged to:: Private residence Living Arrangements: Spouse/significant other Available Help at Discharge: Family Type of Home: House Home Access: Stairs to enter   Technical brewer of Steps: 4 Home Layout: One level        Prior Function Level of Independence: Independent               Hand Dominance   Dominant Hand: Right    Extremity/Trunk Assessment   Upper Extremity Assessment Upper Extremity Assessment: Overall WFL for tasks assessed    Lower Extremity Assessment Lower Extremity Assessment: Overall WFL for tasks assessed    Cervical / Trunk Assessment Cervical / Trunk Assessment: Normal  Communication   Communication: No difficulties  Cognition Arousal/Alertness: Awake/alert Behavior During Therapy: WFL for tasks assessed/performed Overall Cognitive Status: Within Functional Limits for tasks assessed                                        General Comments      Exercises     Assessment/Plan    PT Assessment Patent does not need any further PT services  PT Problem List         PT Treatment Interventions      PT Goals (Current goals can be found in the Care Plan section)  Acute Rehab PT Goals Patient Stated Goal: "go home today." PT Goal Formulation: All assessment and education complete, DC therapy    Frequency     Barriers to discharge        Co-evaluation               AM-PAC PT "6 Clicks" Mobility  Outcome Measure Help needed turning from your back to your side while in a flat bed without using bedrails?: None Help needed moving from lying on your back to sitting on the side of a flat bed without using bedrails?: None Help needed moving to and from a bed to a chair (including a wheelchair)?: None Help needed standing up from a chair using your arms (e.g., wheelchair or bedside chair)?: None Help needed to walk in hospital room?: None Help needed climbing  3-5 steps with a railing? : None 6 Click Score: 24    End of Session   Activity Tolerance: Patient tolerated treatment well Patient left: in bed;with call bell/phone within reach Nurse Communication: Mobility status PT Visit Diagnosis: Difficulty in walking, not elsewhere classified (R26.2)    Time: 3790-2409 PT Time Calculation (min) (ACUTE ONLY): 10 min   Charges:   PT Evaluation $PT Eval Low Complexity: 1 Low     Ellamae Sia, PT, DPT Acute Rehabilitation Services Pager [phone] Office [phone]   Willy Eddy 02/17/2019, 8:54 AM

## 2019-02-18 LAB — T3, FREE: T3, Free: 3.1 pg/mL (ref 2.0–4.4)

## 2019-02-22 ENCOUNTER — Ambulatory Visit: Payer: Medicare Other | Admitting: Internal Medicine

## 2019-03-01 ENCOUNTER — Other Ambulatory Visit: Payer: Self-pay

## 2019-03-02 ENCOUNTER — Telehealth (INDEPENDENT_AMBULATORY_CARE_PROVIDER_SITE_OTHER): Payer: Medicare Other | Admitting: Neurology

## 2019-03-02 VITALS — Ht 71.0 in | Wt 255.0 lb

## 2019-03-02 DIAGNOSIS — G454 Transient global amnesia: Secondary | ICD-10-CM

## 2019-03-02 DIAGNOSIS — G43109 Migraine with aura, not intractable, without status migrainosus: Secondary | ICD-10-CM

## 2019-03-02 MED ORDER — TOPIRAMATE 25 MG PO TABS: ORAL_TABLET | ORAL | 5 refills | Status: DC

## 2019-03-02 NOTE — Progress Notes (Signed)
Virtual Visit via Video Note The purpose of this virtual visit is to provide medical care while limiting exposure to the novel coronavirus.    Consent was obtained for video visit:  Yes.   Answered questions that patient had about telehealth interaction:  Yes.   I discussed the limitations, risks, security and privacy concerns of performing an evaluation and management service by telemedicine. I also discussed with the patient that there may be a patient responsible charge related to this service. The patient expressed understanding and agreed to proceed.  Pt location: Home Physician Location: office Name of referring provider:  Domenic Polite, MD I connected with [name] Clere at patients initiation/request on 03/02/2019 at 11:00 AM EDT by video enabled telemedicine application and verified that I am speaking with the correct person using two identifiers. Pt MRN:  [id number] Pt DOB:  1951-07-17 Video Participants:  [name] Sem;  Aura Dials (wife)   History of Present Illness:  This is a 68 year old right-handed man with a history of OSA, atrial fibrillation on chronic anticoagulation, aortic valve replacement, depression, chronic fatigue syndrome/fibromyalgia, presenting after hospital admission last 02/16/2019 for altered mental status. He was in his usual state of health ("a little unusual all the time anyway"), working in his shop when he stopped doing one thing and could not remember what he was going to do next. As this was going on, his wife came to the shop and he does not remember much until they were riding in the car to the hospital. His wife reports that he has had baseline confusion with his chronic fatigue syndrome for more than 15 years, worse when he is tired, but this day was unusual because he was looking all over the place not knowing what he was doing, and he kept asking her the same thing repeatedly. He would usually argue when he goes to the doctor, but this time  did not argue, he was not as cognizant of how different/serious it was while he was in the hospital. His wife reported that he had been "bouncing off the edge of a migraine daily for 2-3 weeks prior." He was taking a lot of Tylenol, Aleve, and Maxalt. It appears he was back to baseline in the ER. Bloodwork was normal, UDS and EtOH level negative. I personally reviewed MRI/MRA brain which did not show any acute changes. There was mild chronic microvascular disease. There was scattered small vessel atherosclerotic change without significant stenosis. There was an incidental finding of a 69mm focal outpouching at the left MCA bifurcation, suspicious for aneurysm. His wake and drowsy EEG was normal. Symptoms felt due to transient global amnesia or migraine. No further recurrence of similar significant confusion.  They report a complicated history since his early 9s which has been diagnosed as chronic fatigue syndrome (CFS). He takes Effexor. His wife states it has been more than 15 years where he has been confused and kind of forgetful. 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 He would have days where he speaks and thinks better, figuring out things faster, usually when he has an adrenaline burst, then would crash for a couple of days. If he gets overstimulated or surprised by a loud sound or too much information, he would "start going somewhere else" and get confused. He got lost in a store one time. It can last 1-2 hours or a whole day  before he is back to his usual self. 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 He has had migraines since childhood, usually with stabbing pain in the left temporal or retroorbital regions. If he does not stop them, they may go on all day. He would have tunnel vision and see black shimmering objects, with nausea and light  sensitivity, tunnel vision. His left eye would almost shut. He has been taking Maxalt every other day for the past few weeks. He has never been on a preventative medication. He took perphenazine in the past but it caused tongue swelling. He took it a week or so ago and his tongue swelled up again. They report balance issues for the past 1.5 years, he would have extreme fatigue where he feels like he would pass out, occasional vertical diplopia. He was in the hospital while visiting the beach in 08/2018. They fell since his cardiac surgery in 2018, "more funny things have been going on since then." He does not drive. His mother and maternal grandmother had migraines. He denies any focal numbness/tingling/weakness, dysphagia. Sleep is good with CPAP machine.   PAST MEDICAL HISTORY: Past Medical History:  Diagnosis Date   Allergic rhinitis    Anxiety    Arthritis    Asthma    Bladder tumor    Chronic fatigue    Depression    06/30/17 Pt denies being depressed, reports Effexor is taken for Chronic Fatigue    Dyspnea    Enlarged prostate    Fibromyalgia    GERD (gastroesophageal reflux disease)    Headache    History of chronic bronchitis    History of migraine    History of toxic encephalopathy    Hypothyroidism    OSA on CPAP    CPAP 14   PAF (paroxysmal atrial fibrillation) ([place]) CARDIOLOGIST -- DR [name]   DX OCT 2013   Sensitiveness to light    Unspecified essential hypertension    Urethral tumor    PROSTATIC    PAST SURGICAL HISTORY: Past Surgical History:  Procedure Laterality Date   BENTALL PROCEDURE N/A 07/02/2017   Procedure: BENTALL PROCEDURE;  Surgeon: Ivin Poot, MD;  Location: [place];  Service: Open Heart Surgery;  Laterality: N/A;  WITH CIRC ARREST   CARDIAC SURGERY     COLONOSCOPY     CYSTOSCOPY W/ RETROGRADES Bilateral 06/15/2013   Procedure: CYSTOSCOPY WITH BILATERAL RETROGRADE PYELOGRAM  BLADDER BIOPSY, PROSTATIC URETHRAL BIOPSY, ;   Surgeon: Molli Hazard, MD;  Location: [facility];  Service: Urology;  Laterality: Bilateral;   ESOPHAGOGASTRODUODENOSCOPY (EGD) WITH PROPOFOL N/A 03/11/2018   Procedure: ESOPHAGOGASTRODUODENOSCOPY (EGD) WITH PROPOFOL;  Surgeon: Clarene Essex, MD;  Location: [place];  Service: Endoscopy;  Laterality: N/A;   LAPAROSCOPIC CHOLECYSTECTOMY  01-14-2001   RIGHT/LEFT HEART CATH AND CORONARY ANGIOGRAPHY N/A 06/12/2017   Procedure: RIGHT/LEFT HEART CATH AND CORONARY ANGIOGRAPHY;  Surgeon: Larey Dresser, MD;  Location: [place] [facility];  Service: Cardiovascular;  Laterality: N/A;   TEE WITHOUT CARDIOVERSION N/A 07/02/2017   Procedure: TRANSESOPHAGEAL ECHOCARDIOGRAM (TEE);  Surgeon: Prescott Gum, Collier Salina, MD;  Location: [place];  Service: Open Heart Surgery;  Laterality: N/A;   UMBILICAL HERNIA REPAIR  01-14-2008    MEDICATIONS: Current Outpatient Medications on File Prior to Visit  Medication Sig Dispense Refill   acetaminophen (TYLENOL) 500 MG tablet Take 1,000 mg by mouth every 8 (eight) hours as needed for mild pain, fever or  headache. Per bottle as needed     albuterol (VENTOLIN HFA) 108 (90 Base) MCG/ACT inhaler INHALE 2 PUFFS INTO LUNGS EVERY 6 HOURS AS NEEDED FOR WHEEZING OR SHORTNESS OF BREATH (Patient taking differently: Inhale 2 puffs into the lungs every 6 (six) hours as needed for wheezing or shortness of breath. ) 18 g 12   azelastine (ASTELIN) 137 MCG/SPRAY nasal spray Place 1-2 sprays into the nose 2 (two) times daily as needed for rhinitis or allergies.      budesonide-formoterol (SYMBICORT) 160-4.5 MCG/ACT inhaler INHALE 2 PUFFS BY MOUTH TWICE DAILY 10.2 g 5   cetirizine (ZYRTEC) 10 MG tablet Take 10 mg by mouth daily.     DHEA 50 MG TABS Take 50 mg by mouth daily with breakfast.      Ferrous Sulfate ([employer] SLOW RELEASE IRON PO) Take 45 mg by mouth 2 (two) times daily.     finasteride (PROSCAR) 5 MG tablet Take 5 mg by mouth at bedtime.       levothyroxine (SYNTHROID, LEVOTHROID) 112 MCG tablet Take 112 mcg by mouth daily before breakfast.      liothyronine (CYTOMEL) 25 MCG tablet Take 25 mcg by mouth daily.      losartan (COZAAR) 50 MG tablet Take 1 tablet (50 mg total) by mouth daily. 30 tablet 6   metoprolol tartrate (LOPRESSOR) 50 MG tablet TAKE 1 TABLET(50 MG) BY MOUTH TWICE DAILY (Patient taking differently: Take 50 mg by mouth 2 (two) times daily. ) 60 tablet 7   Multiple Vitamins-Minerals (ONE-A-DAY MENS HEALTH FORMULA PO) Take 1 tablet by mouth daily with breakfast.      omeprazole (PRILOSEC) 40 MG capsule Take 1 capsule (40 mg total) by mouth 2 (two) times daily. 180 capsule 1   rizatriptan (MAXALT) 10 MG tablet Take 10 mg by mouth as needed for migraine. May repeat in 2 hours if needed     tamsulosin (FLOMAX) 0.4 MG CAPS capsule Take 0.4 mg by mouth daily after supper.     testosterone cypionate (DEPOTESTOTERONE CYPIONATE) 100 MG/ML injection Inject 200 mg into the muscle every Thursday. For IM use only      venlafaxine (EFFEXOR-XR) 150 MG 24 hr capsule Take 150 mg by mouth daily with breakfast.      XARELTO 20 MG TABS tablet TAKE 1 TABLET(20 MG) BY MOUTH DAILY WITH SUPPER (Patient taking differently: Take 20 mg by mouth daily with supper. ) 90 tablet 1   amoxicillin (AMOXIL) 500 MG capsule Take 2,000 mg by mouth See admin instructions. 1 hour prior to dental procedure     benzonatate (TESSALON) 200 MG capsule Take 1 capsule by mouth every 8 hours as needed for cough (Patient not taking: Reported on 03/02/2019) 20 capsule 1   COLCRYS 0.6 MG tablet Take 0.6 mg by mouth daily.      dextromethorphan (DELSYM) 30 MG/5ML liquid Take 15 mg by mouth 2 (two) times daily as needed for cough.      Dextromethorphan-guaiFENesin (MUCINEX DM MAXIMUM STRENGTH) 60-1200 MG TB12 Take 1 tablet by mouth 2 (two) times daily as needed (cough).      diphenhydrAMINE (BENADRYL) 25 MG tablet Take 50 mg by mouth every 6 (six) hours as  needed (tongue swelling due to Perphenazine).     EPINEPHrine 0.3 mg/0.3 mL IJ SOAJ injection Inject 0.3 mLs (0.3 mg total) into the muscle as needed (for angioedema). (Patient not taking: Reported on 03/02/2019) 2 Device 0   famotidine (PEPCID) 20 MG tablet Take 20 mg by  mouth daily as needed for heartburn or indigestion.      No current facility-administered medications on file prior to visit.     ALLERGIES: Allergies  Allergen Reactions   Perphenazine Other (See Comments)    Tongue swelling   Amiodarone Swelling   Codeine Itching   Tramadol Other (See Comments)    dellusion     FAMILY HISTORY: Family History  Problem Relation Age of Onset   Aortic aneurysm Mother 57       cause of death   Other Father        motor vehicle accident   Heart disease Other        family history      Observations/Objective:   Vitals:   03/02/19 0903  Weight: 255 lb (115.7 kg)  Height: 5\' 11"  (1.803 m)   GEN:  The patient appears stated age and is in NAD.  Neurological examination: Patient is awake, alert, oriented x 3. Speech is mildly slowed but no clear aphasia or significant dysarthria. Intact fluency and comprehension. Remote and recent memory impaired. Able to name and repeat. Cranial nerves: Extraocular movements intact with no nystagmus. No facial asymmetry. Motor: moves all extremities symmetrically, at least anti-gravity x 4. No incoordination on finger to nose testing. Gait: narrow-based and steady, able to tandem walk adequately. Negative Romberg test.  Montreal Cognitive Assessment  03/07/2019  Visuospatial/ Executive (0/5) 4  Naming (0/3) 3  Attention: Read list of digits (0/2) 2  Attention: Read list of letters (0/1) 1  Attention: Serial 7 subtraction starting at 100 (0/3) 3  Language: Repeat phrase (0/2) 1  Language : Fluency (0/1) 1  Abstraction (0/2) 2  Delayed Recall (0/5) 0  Orientation (0/6) 5  Total 22    Assessment and Plan:   This is a 68 year old  right-handed man with a history of OSA, atrial fibrillation on chronic anticoagulation, aortic valve replacement, depression, chronic fatigue syndrome/fibromyalgia, presenting after hospital admission last 02/16/2019 for altered mental status. Episode suggestive of transient global amnesia. MRI brain and EEG unremarkable. Diagnosis and prognosis discussed with the patient and wife today. We discussed MRA incidental finding of a smal 18mm outpouching at left MCA bifurcation suggestive of aneurysm, discussed control of BP and interval imaging in a year. We discussed starting a migraine preventative medication, he is agreeable to starting Topiramate 25mg  qhs x 1 week, then increase to 50mg  qhs. If no side effects, will uptitrate to 50mg  BID and increase if necessary. Monitor for worsening cognition, MOCA score today 22/30, they report a chronic history of baseline confusion due to chronic fatigue syndrome. We discussed medication overuse headaches, he was advised to start cutting down on OTC pain medication and minimizing rescue medication to 2-3 times a week. Follow-up in 6 months, he knows to call for any changes.    Follow Up Instructions:   -I discussed the assessment and treatment plan with the patient. The patient was provided an opportunity to ask questions and all were answered. The patient agreed with the plan and demonstrated an understanding of the instructions.   The patient was advised to call back or seek an in-person evaluation if the symptoms worsen or if the condition fails to improve as anticipated.    Cameron Sprang, MD

## 2019-03-09 ENCOUNTER — Encounter: Payer: Self-pay | Admitting: Internal Medicine

## 2019-03-09 ENCOUNTER — Other Ambulatory Visit: Payer: Self-pay

## 2019-03-09 ENCOUNTER — Ambulatory Visit (INDEPENDENT_AMBULATORY_CARE_PROVIDER_SITE_OTHER): Payer: Medicare Other | Admitting: Internal Medicine

## 2019-03-09 DIAGNOSIS — J45991 Cough variant asthma: Secondary | ICD-10-CM

## 2019-03-09 DIAGNOSIS — R05 Cough: Secondary | ICD-10-CM | POA: Diagnosis not present

## 2019-03-09 DIAGNOSIS — R058 Other specified cough: Secondary | ICD-10-CM | POA: Insufficient documentation

## 2019-03-09 NOTE — Progress Notes (Signed)
Grant [name], male    DOB: 12-21-1950      MRN: [id number]    Brief patient profile:  48 yowm quit smoking 1981 with no cough or sob at that point and then developed prolonged cough in winter while working in ? [name] basement" at [facility] around 2005 and q winter since them same pattern but became more of a chronic year round pattern around 2015  Some  worse  esp in winter usually better in summer mostly dry but more productive since summer of 2019 variably purulent.   Patient timeline listed below: 06/10/2018-[facility]-cough and congestion 06/25/2018-flu shot and Prevnar 07/22/2018- returned from beach with bad cough 07/26/2018- echo with [name] 08/11/2018-[facility]-cough with Depo shot 08/20/2018-[facility] 08/24/2018-[facility] cough and Depo shot 09/22/18 -[facility]-Z-Pak and Depo-Medrol injection 12/30/2019[name]- Depo-Medrol and Levaquin      11/15/2018   1st office eval/Wert re cough x late summer 2019  And turned brown just x few weeks  Chief Complaint  Patient presents with  . Pulmonary Consult    Former patient of [name]. Pt c/o cough since Sept 2019.  Cough is prod with brown sputum. He coughs the most in the morning when he wakes up. He occ wakes up in the night coughing.   assoc with sensation pnds/ best rx is depomedrol 120 mg IM  Advair rx now worse with advair and prev worse with symb 160 but hfa poor baseline zpak now Med list not accurate No increase chronic doe x  MMRC3 = can't walk 100 yards even at a slow pace at a flat grade s stopping due to sob     [name] Reflux v Neurogenic Cough Differentiator Reflux Comments  Do you awaken from a sound sleep coughing violently?                            With trouble breathing? Some nights Unless cough med    Do you have choking episodes when you cannot  Get enough air, gasping for air ?              Yes   Do you usually  cough when you lie down into  The bed, or when you just lie down to rest ?                          < 45 start coughing   Do you usually cough after meals or eating?         Big meal   Do you cough when (or after) you bend over?    No    GERD SCORE     [name] Reflux v Neurogenic Cough Differentiator Neurogenic   Do you more-or-less cough all day long? All day   Does change of temperature make you cough? No    Does laughing or chuckling cause you to cough? sometimes   Do fumes (perfume, automobile fumes, burned  Toast, etc.,) cause you to cough ?      no   Does speaking, singing, or talking on the phone cause you to cough   ?               Talking    Neurogenic/Airway score        No obvious other patterns in day to day or daytime variability or assoc  mucus plugs or hemoptysis or cp or chest tightness, subjective wheeze or overt   hb symptoms ( on ppi bid but not ac)     rec If the mucus doesn't turn clear >>> omnicef 300 mg twice daily x 10 days For cough / congestion >  mucinex dm 1200 mg every 12 hours supplement with tessilon and no delsym  Depomedrol 120 mg IM  Symbicort 80 Take 2 puffs first thing in am and then another 2 puffs about 12 hours later.  Only use your levoalbuterol as a rescue medication  Please schedule a follow up office visit in 2 weeks, call sooner if needed with all medications /inhalers/ solutions in hand    11/29/2018  f/u ov/Wert re: chronic throat clearing x decades/  cough worse since summer 2019 / confused with meds  Chief Complaint  Patient presents with  . Follow-up    Cough has improved slightly. He uses his xopenex and ventolin every few days.   Dyspnea: denies now but very sedentary  Cough: min improved  Sleeping: no bed blocks  SABA use: using maint alb and xopenex  02: none / cpap does  fine per [name]  rec Delsym two tsp twice daily automatically  Symbicort 160 Take 2 puffs first thing in am and then another 2 puffs about 12 hours  later.  Only use your abuterol and levoalbuterol as a rescue medication Omeprazole 40 mg Take 30- 60 min before your first and last meals of the day  GERD diet . See [name] w/in 2 weeks with all your medications,    - late: add add gabapentin 100 tid if still throat cleairng on delsym 2 tsp bid and tessalon prn   12/20/2018 NP acute rx Mucinex Twice daily  As needed  Cough  Deslym 2 tsp Twice daily  As needed  Cough Tessalon Three times a day  As needed  Cough .  Try Chlortabs 4mg  Twice daily  As needed  Drainage , throat clearing , drippy nose.  Steroid injection today .     Virtual Visit via Telephone Note 03/09/2019 [facility]   I connected with Grant [name] on 03/09/19 at 0830  by telephone and verified that I am speaking with the correct person using two identifiers.   I discussed the limitations, risks, security and privacy concerns of performing an evaluation and management service by telephone and the availability of in person appointments. I also discussed with the patient that there may be a patient responsible charge related to this service. The patient expressed understanding and agreed to proceed.   History of Present Illness: Dyspnea:  Walking x sev miles/ some hills/ nl pace  Cough: much better x day to weeks s cough  Or need for medication Sleeping: cpap/bed blocks  SABA use: maybe up to 1-2 x weekly  02: none  Some increase in throat clearing, sense of drainage, only using one 1st gen H1 blocker per day      No obvious day to day or daytime variability or assoc excess/ purulent sputum or mucus plugs or hemoptysis or cp or chest tightness, subjective wheeze or overt  hb symptoms.    Also denies any obvious fluctuation of symptoms with weather or environmental changes or other aggravating or alleviating factors except as outlined above.   Meds reviewed/ med reconciliation completed         Observations/Objective: Sounds great on phone, no hoarseness or throat  clearing, speaking in full sentences  Assessment and Plan: See problem list for active a/p's   Follow Up Instructions: See avs for instructions unique to this ov which includes revised/ updated med list     I discussed the assessment and treatment plan with the patient. The patient was provided an opportunity to ask questions and all were answered. The patient agreed with the plan and demonstrated an understanding of the instructions.   The patient was advised to call back or seek an in-person evaluation if the symptoms worsen or if the condition fails to improve as anticipated.  I provided 22  minutes of non-face-to-face time during this encounter.   Christinia Gully, MD

## 2019-03-09 NOTE — Patient Instructions (Signed)
For drainage / throat tickle try take CHLORPHENIRAMINE  4 mg  (Chlortab 4mg   at [employer] should be easiest to find in the green box)  take one every 4 hours as needed - available over the counter- may cause drowsiness so start with just a bedtime dose or two and see how you tolerate it before trying in daytime    Keep up the good work with your walking - I would do at least 30 min daily   No change in any of your medications  Please schedule a follow up visit in 3 months but call sooner if needed  with all medications /inhalers/ solutions in hand so we can verify exactly what you are taking. This includes all medications from all doctors and over the counters    Pt has my chart so no paper copy needed

## 2019-03-09 NOTE — Assessment & Plan Note (Signed)
Flare 03/09/2019 > try 1st gen H1 blockers per guidelines    If not satisfied can return for allergy testing and trial of singulair if never done and gabapentin as last option as much of what he's described "for 37 y" is more c/w irritable larynx   Each maintenance medication was reviewed in detail including most importantly the difference between maintenance and as needed and under what circumstances the prns are to be used.  Please see AVS for specific  Instructions which are unique to this visit and I personally typed out  which were reviewed in detail over the phone with the patient/wife  and a copy provided via mychart.

## 2019-03-09 NOTE — Assessment & Plan Note (Signed)
Onset of cough around ? 2005 (throat clearing x decades) became daily around 2015 - Spirometry 11/15/2018  FEV1 3.1 (89%)  Ratio 0.74 s curvature ? On advair - FENO 11/15/2018  =   7 - symb 80 2bid 11/15/2018 > ? Helped but pt returned on symb 160 plus maint saba x 2 different versions so rec continue symb 160   All goals of chronic asthma control met including optimal function and elimination of symptoms with minimal need for rescue therapy>>> no change rx  Contingencies discussed in full including contacting this office immediately if not controlling the symptoms using the rule of two's.

## 2019-03-15 ENCOUNTER — Telehealth: Payer: Self-pay | Admitting: Neurology

## 2019-03-15 MED ORDER — TOPIRAMATE 25 MG PO TABS: 12.5000 mg | ORAL_TABLET | Freq: Every day | ORAL | 5 refills | Status: DC

## 2019-03-15 NOTE — Telephone Encounter (Signed)
How long has he been taking the Topamax? It is unlikely an interaction with his allergy medication, but could be a side effect of the medication itself. Do they want to try a very slow increase by taking 1/2 tablet every night and see if he tolerates this better? Thanks

## 2019-03-15 NOTE — Telephone Encounter (Signed)
Patient's wife called regarding his Topiramate medication 25 MG. She said that yesterday he seemed "[employer]". He layed on the couch all day. She said he was not the same person she saw on the E visit. She took him off of it today and he is up walking around and not acting that way anymore. She also wondered could it have interacted with his Allergy Medication? Please Call. Thanks

## 2019-03-15 NOTE — Telephone Encounter (Signed)
Spoke with pt wife she will cut pt medication  25mg  in half to 12.5mg  see how he does, if still having the side affects will stop the medication I will call him back on Tuesday to see how he is doing.

## 2019-03-15 NOTE — Telephone Encounter (Signed)
Pt wife asking if she does the 1/2 tab and it continues causing him to have the side effects is it ok for her to stop the medication , she stated she will try for a week at 1/2 tab and let us know how he is dong? I told her I would call her back to let her know if it would be ok to stop the medication if the side effects continue

## 2019-03-15 NOTE — Telephone Encounter (Signed)
Yes, if he does not tolerate 1/2 tab after a week or so, would stop and call our office once he is feeling better. Thanks

## 2019-03-15 NOTE — Addendum Note (Signed)
Addended by: Jake Seats on: 03/15/2019 12:12 PM   Modules accepted: Orders

## 2019-03-16 ENCOUNTER — Encounter: Payer: Medicare Other | Admitting: Cardiothoracic Surgery

## 2019-03-18 ENCOUNTER — Other Ambulatory Visit: Payer: Self-pay | Admitting: Gastroenterology

## 2019-03-18 DIAGNOSIS — R1032 Left lower quadrant pain: Secondary | ICD-10-CM

## 2019-03-21 ENCOUNTER — Ambulatory Visit
Admission: RE | Admit: 2019-03-21 | Discharge: 2019-03-21 | Disposition: A | Discharge status: Home / Self Care | Payer: Medicare Other | Source: Ambulatory Visit | Attending: Gastroenterology | Admitting: Gastroenterology

## 2019-03-21 ENCOUNTER — Other Ambulatory Visit: Payer: Self-pay

## 2019-03-21 DIAGNOSIS — N400 Enlarged prostate without lower urinary tract symptoms: Secondary | ICD-10-CM | POA: Diagnosis not present

## 2019-03-21 DIAGNOSIS — R1032 Left lower quadrant pain: Secondary | ICD-10-CM

## 2019-03-21 DIAGNOSIS — K6389 Other specified diseases of intestine: Secondary | ICD-10-CM | POA: Diagnosis not present

## 2019-03-21 MED ORDER — IOPAMIDOL (ISOVUE-300) INJECTION 61%
100.0000 mL | Freq: Once | INTRAVENOUS | Status: AC | PRN
  Administered 2019-03-21: 100 mL via INTRAVENOUS

## 2019-03-22 ENCOUNTER — Telehealth: Payer: Self-pay

## 2019-03-22 NOTE — Telephone Encounter (Signed)
Pt is tolerating the topamax at 12.5mg  he has been walking more, he has had a few dizzy spells recommended wife to check pt bp sitting then when he stands up maybe dehydrated if doing more walking, she stated that she would check his bp sitting then standing to see, she stated she would get him to drink more water and Gatorade instead of soda or tea. I will call next week to follow up, they will be going out of town in 2 weeks so I just wanted to follow up one more time before they go to the beach,

## 2019-03-24 ENCOUNTER — Other Ambulatory Visit: Payer: Medicare Other

## 2019-03-24 ENCOUNTER — Telehealth: Payer: Self-pay | Admitting: Neurology

## 2019-03-24 ENCOUNTER — Telehealth: Payer: Self-pay

## 2019-03-24 NOTE — Telephone Encounter (Signed)
Spoke with Juliann Pulse pt wife she stated  Dvaughn stopped taken his topamax stated he did not like the way he has felt taken it stated the dizziness has been worse being on the medication, pt does have maxalt to use if he needs it, pt and wife stated they are going to monitor more for triggers, I will still call pt back next week to see how he is doing.

## 2019-03-24 NOTE — Telephone Encounter (Signed)
Noted, thanks!

## 2019-03-24 NOTE — Telephone Encounter (Signed)
Pls call pt's wife Juliann Pulse, she wants to talk about pt's medications.

## 2019-03-24 NOTE — Telephone Encounter (Signed)
Spoke with Juliann Pulse pt wife she stated  Grant [name] stopped taken his topamax stated he did not like the way he has felt taken it stated the dizziness has been worse being on the medication, pt does have maxalt to use if he needs it, pt and wife stated they are going to monitor more for triggers, I will still call pt back next week to see how he is doing.

## 2019-03-29 ENCOUNTER — Telehealth: Payer: Self-pay

## 2019-03-29 NOTE — Telephone Encounter (Signed)
Noted, thanks!

## 2019-03-29 NOTE — Telephone Encounter (Signed)
Pt called follow up after stopping topamax, pt is doing much better, more active, clear thinking, they have been monitoring triggers and stopping activities before he gets to tired or if it seems to bother him. Pt wife Juliann Pulse stated she would call if anything changes.

## 2019-04-05 DIAGNOSIS — R350 Frequency of micturition: Secondary | ICD-10-CM | POA: Diagnosis not present

## 2019-04-05 DIAGNOSIS — N401 Enlarged prostate with lower urinary tract symptoms: Secondary | ICD-10-CM | POA: Diagnosis not present

## 2019-04-06 ENCOUNTER — Other Ambulatory Visit: Payer: Self-pay | Admitting: Physician Assistant

## 2019-04-07 ENCOUNTER — Other Ambulatory Visit: Payer: Self-pay | Admitting: Cardiovascular Disease

## 2019-04-27 ENCOUNTER — Encounter: Payer: Medicare Other | Admitting: Cardiothoracic Surgery

## 2019-04-27 DIAGNOSIS — G4733 Obstructive sleep apnea (adult) (pediatric): Secondary | ICD-10-CM | POA: Diagnosis not present

## 2019-04-28 ENCOUNTER — Encounter: Payer: Medicare Other | Admitting: Cardiothoracic Surgery

## 2019-05-06 ENCOUNTER — Other Ambulatory Visit: Payer: Self-pay | Admitting: Cardiovascular Disease

## 2019-05-06 NOTE — Telephone Encounter (Signed)
Last seen by Dr [name] 02/26/18, pt is overdue for follow-up. Last labs 02/16/19 Creat 1.25, age 68, weight 123.4kg, CrCl 102.83.  Based on CrCl pt is on appropriate dosage of Xarelto 20mg  QD. Given Covid-19 and pt is overdue for follow-up, will refill x 1 only with note on refill for pt to schedule f/u appt for future refills.

## 2019-05-18 ENCOUNTER — Encounter: Payer: Medicare Other | Admitting: Cardiothoracic Surgery

## 2019-05-23 DIAGNOSIS — R2231 Localized swelling, mass and lump, right upper limb: Secondary | ICD-10-CM | POA: Diagnosis not present

## 2019-05-23 DIAGNOSIS — R05 Cough: Secondary | ICD-10-CM | POA: Diagnosis not present

## 2019-06-03 DIAGNOSIS — K5732 Diverticulitis of large intestine without perforation or abscess without bleeding: Secondary | ICD-10-CM | POA: Diagnosis not present

## 2019-06-06 ENCOUNTER — Other Ambulatory Visit: Payer: Self-pay | Admitting: Cardiothoracic Surgery

## 2019-06-06 DIAGNOSIS — Z0130 Encounter for examination of blood pressure without abnormal findings: Secondary | ICD-10-CM

## 2019-06-06 DIAGNOSIS — Z952 Presence of prosthetic heart valve: Secondary | ICD-10-CM

## 2019-06-06 DIAGNOSIS — J209 Acute bronchitis, unspecified: Secondary | ICD-10-CM | POA: Diagnosis not present

## 2019-06-08 ENCOUNTER — Other Ambulatory Visit: Payer: Self-pay

## 2019-06-08 ENCOUNTER — Encounter: Payer: Self-pay | Admitting: Cardiothoracic Surgery

## 2019-06-08 ENCOUNTER — Ambulatory Visit: Payer: Medicare Other | Admitting: Cardiothoracic Surgery

## 2019-06-08 ENCOUNTER — Ambulatory Visit
Admission: RE | Admit: 2019-06-08 | Discharge: 2019-06-08 | Disposition: A | Discharge status: Home / Self Care | Payer: Medicare Other | Source: Ambulatory Visit | Attending: Cardiothoracic Surgery | Admitting: Cardiothoracic Surgery

## 2019-06-08 ENCOUNTER — Other Ambulatory Visit: Payer: Self-pay | Admitting: Cardiothoracic Surgery

## 2019-06-08 VITALS — BP 157/101 | HR 56 | Temp 97.7°F | Resp 16 | Ht 70.0 in | Wt 262.0 lb

## 2019-06-08 DIAGNOSIS — Z9889 Other specified postprocedural states: Secondary | ICD-10-CM | POA: Diagnosis not present

## 2019-06-08 DIAGNOSIS — Z952 Presence of prosthetic heart valve: Secondary | ICD-10-CM | POA: Diagnosis not present

## 2019-06-08 DIAGNOSIS — Z8679 Personal history of other diseases of the circulatory system: Secondary | ICD-10-CM | POA: Diagnosis not present

## 2019-06-08 NOTE — Progress Notes (Signed)
PCP is Shirline Frees, MD Referring Provider is Josue Hector, MD  Chief Complaint  Patient presents with  . Routine Post Op    s/p BENTALL 07/02/17.Marland KitchenMarland KitchenFOR bp check...DUE FOR 2 YR F/U CTA CHEST IN SEPTEMBER 2020    HPI: Patient returns for blood pressure check.  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  He is doing well.  [place]  He walks 3 to 4 miles daily without difficulty.  Today I checked his blood pressure personally and was 135/80.  Heart rate is 58 sinus.  He is on losartan 50 mg daily and metoprolol twice daily.  Patient was recently treated with oral antibiotics for an episode of diverticulitis.  He was evaluated by surgery but recommended for medical therapy.  Past Medical History:  Diagnosis Date  . Allergic rhinitis   . Anxiety   . Arthritis   . Asthma   . Bladder tumor   . Chronic fatigue   . Depression    06/30/17 Pt denies being depressed, reports Effexor is taken for Chronic Fatigue   . Dyspnea   . Enlarged prostate   . Fibromyalgia   . GERD (gastroesophageal reflux disease)   . Headache   . History of chronic bronchitis   . History of migraine   . History of toxic encephalopathy   . Hypothyroidism   . OSA on CPAP    CPAP 14  . PAF (paroxysmal atrial fibrillation) ([place]) CARDIOLOGIST -- DR [name]   DX OCT 2013  . 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 Unspecified essential hypertension   . Urethral tumor    PROSTATIC    Past Surgical History:  Procedure Laterality Date  . BENTALL PROCEDURE N/A 07/02/2017   Procedure: BENTALL PROCEDURE;  Surgeon: Ivin Poot, MD;  Location: [facility];  Service: Open Heart Surgery;  Laterality: N/A;  WITH CIRC ARREST  . CARDIAC SURGERY    . COLONOSCOPY    . CYSTOSCOPY W/ RETROGRADES Bilateral 06/15/2013   Procedure: CYSTOSCOPY WITH BILATERAL RETROGRADE PYELOGRAM  BLADDER BIOPSY, PROSTATIC URETHRAL BIOPSY, ;  Surgeon: Molli Hazard, MD;  [facility];   Service: Urology;  Laterality: Bilateral;  . ESOPHAGOGASTRODUODENOSCOPY (EGD) WITH PROPOFOL N/A 03/11/2018   Procedure: ESOPHAGOGASTRODUODENOSCOPY (EGD) WITH PROPOFOL;  Surgeon: Clarene Essex, MD;  Location: [facility];  Service: Endoscopy;  Laterality: N/A;  . LAPAROSCOPIC CHOLECYSTECTOMY  01-14-2001  . RIGHT/LEFT HEART CATH AND CORONARY ANGIOGRAPHY N/A 06/12/2017   Procedure: RIGHT/LEFT HEART CATH AND CORONARY ANGIOGRAPHY;  Surgeon: Larey Dresser, MD;  Location: [facility];  Service: Cardiovascular;  Laterality: N/A;  . TEE WITHOUT CARDIOVERSION N/A 07/02/2017   Procedure: TRANSESOPHAGEAL ECHOCARDIOGRAM (TEE);  Surgeon: Prescott Gum, Collier Salina, MD;  Location: [facility];  Service: Open Heart Surgery;  Laterality: N/A;  . UMBILICAL HERNIA REPAIR  01-14-2008    Family History  Problem Relation Age of Onset  . Aortic aneurysm Mother 65       cause of death  . Other Father        motor vehicle accident  . Heart disease Other        family history    Social History Social History   Tobacco Use  . Smoking status: Former Smoker    Packs/day: 0.50    Years: 27.00    Pack years: 13.50    Types: Cigarettes    Quit date: 10/21/1979    Years since quitting: 39.6  . Smokeless tobacco: Never Used  Substance Use Topics  . Alcohol use: Yes    Alcohol/week: 4.0 standard drinks    Types: 4 Cans of beer per week    Comment: drinks "as much as I could" - 2-3 beers up to 8-10  . Drug use: No    Current Outpatient Medications  Medication Sig Dispense Refill  . acetaminophen (TYLENOL) 500 MG tablet Take 1,000 mg by mouth every 8 (eight) hours as needed for mild pain, fever or headache. Per bottle as needed    . albuterol (VENTOLIN HFA) 108 (90 Base) MCG/ACT inhaler INHALE 2 PUFFS INTO LUNGS EVERY 6 HOURS AS NEEDED FOR WHEEZING OR SHORTNESS OF BREATH (Patient taking differently: Inhale 2 puffs into the lungs every 6 (six) hours as needed for wheezing or shortness of breath. ) 18 g 12  . amoxicillin  (AMOXIL) 500 MG capsule Take 2,000 mg by mouth See admin instructions. 1 hour prior to dental procedure    . azelastine (ASTELIN) 137 MCG/SPRAY nasal spray Place 1-2 sprays into the nose 2 (two) times daily as needed for rhinitis or allergies.     . benzonatate (TESSALON) 200 MG capsule Take 1 capsule by mouth every 8 hours as needed for cough 20 capsule 1  . budesonide-formoterol (SYMBICORT) 160-4.5 MCG/ACT inhaler INHALE 2 PUFFS BY MOUTH TWICE DAILY 10.2 g 5  . cetirizine (ZYRTEC) 10 MG tablet Take 10 mg by mouth daily.    Marland Kitchen COLCRYS 0.6 MG tablet Take 0.6 mg by mouth daily.     Marland Kitchen dextromethorphan (DELSYM) 30 MG/5ML liquid Take 15 mg by mouth 2 (two) times daily as needed for cough.     . Dextromethorphan-guaiFENesin (MUCINEX DM MAXIMUM STRENGTH) 60-1200 MG TB12 Take 1 tablet by mouth 2 (two) times daily as needed (cough).     Marland Kitchen DHEA 50 MG TABS Take 50 mg by mouth daily with breakfast.     . diphenhydrAMINE (BENADRYL) 25 MG tablet Take 50 mg by mouth every 6 (six) hours as needed (tongue swelling due to Perphenazine).    Marland Kitchen EPINEPHrine 0.3 mg/0.3 mL IJ SOAJ injection Inject 0.3 mLs (0.3 mg total) into the muscle as needed (for angioedema). 2 Device 0  . famotidine (PEPCID) 20 MG tablet Take 20 mg by mouth daily as needed for heartburn or indigestion.     . Ferrous Sulfate ([employer] SLOW RELEASE IRON PO) Take 45 mg by mouth 2 (two) times daily.    . finasteride (PROSCAR) 5 MG tablet Take 5 mg by mouth at bedtime.     Marland Kitchen levothyroxine (SYNTHROID, LEVOTHROID) 112 MCG tablet Take 112 mcg by mouth daily before breakfast.     . liothyronine (CYTOMEL) 25 MCG tablet Take 25 mcg by mouth daily.     Marland Kitchen losartan (COZAAR) 50 MG tablet Take 1 tablet (50 mg total) by mouth daily. 30 tablet 6  . metoprolol tartrate (LOPRESSOR) 50 MG tablet TAKE 1 TABLET(50 MG) BY MOUTH TWICE DAILY 180 tablet 0  . Multiple Vitamins-Minerals (ONE-A-DAY MENS HEALTH FORMULA PO) Take 1 tablet by mouth daily with breakfast.     . omeprazole  (PRILOSEC) 40 MG capsule Take 1 capsule (40 mg total) by mouth 2 (two) times daily. 180 capsule 1  . rivaroxaban (XARELTO) 20 MG TABS tablet Take 1 tablet (20 mg total) by mouth daily with supper. Pt is overdue for follow-up, Needs OV for future refills. 90 tablet 0  . rizatriptan (MAXALT) 10 MG tablet Take 10 mg by mouth as needed for migraine. May repeat in 2  hours if needed    . tamsulosin (FLOMAX) 0.4 MG CAPS capsule Take 0.4 mg by mouth daily after supper.    . testosterone cypionate (DEPOTESTOTERONE CYPIONATE) 100 MG/ML injection Inject 200 mg into the muscle every Thursday. For IM use only     . venlafaxine (EFFEXOR-XR) 150 MG 24 hr capsule Take 150 mg by mouth daily with breakfast.      No current facility-administered medications for this visit.     Allergies  Allergen Reactions  . Perphenazine Other (See Comments)    Tongue swelling  . Amiodarone Swelling  . Codeine Itching  . Tramadol Other (See Comments)    dellusion     Review of Systems  No chest pain No shortness of breath No palpitations Patient taking amoxicillin as prophylaxis before dental procedures  BP (!) 157/101 (BP Location: Left Arm, Patient Position: Sitting, Cuff Size: Large)   Pulse (!) 56   Temp 97.7 F (36.5 C)   Resp 16   Ht 5\' 10"  (1.778 m)   Wt 262 lb (118.8 kg)   SpO2 97% Comment: RA  BMI 37.59 kg/m  Physical Exam      Exam    General- alert and comfortable    Neck- no JVD, no cervical adenopathy palpable, no carotid bruit   Lungs- clear without rales, wheezes   Cor- regular rate and rhythm, no murmur , gallop   Abdomen- soft, non-tender   Extremities - warm, non-tender, minimal edema   Neuro- oriented, appropriate, no focal weakness   Diagnostic Tests: Chest x-ray performed today personally reviewed.  Lungs are clear.  Stable cardiac silhouette.  Sternal wires are slightly malaligned.  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  Impression: Blood pressure today  is well controlled on her current medications.  Continue current medications.  Plan: Referred return for CTA of thoracic aorta 2 years postop- December 2020   Len Childs, MD [facility] [phone]

## 2019-06-09 ENCOUNTER — Other Ambulatory Visit: Payer: Self-pay | Admitting: Cardiothoracic Surgery

## 2019-06-18 ENCOUNTER — Other Ambulatory Visit: Payer: Self-pay | Admitting: Cardiovascular Disease

## 2019-06-20 ENCOUNTER — Other Ambulatory Visit: Payer: Self-pay | Admitting: Cardiovascular Disease

## 2019-06-20 NOTE — Telephone Encounter (Signed)
59m 118.8kg Scr 1.2 02/16/19 ccr 122mlmin Lovw/nishan 02/26/18  Will give 1 mos refill added note to pharmacy that the pt needs appt w/cardiologist for further refills

## 2019-06-21 NOTE — Telephone Encounter (Signed)
This med was filled on yesterday by [facility].  The pt is 68 yrs old, wt-118.8kg, Crea-1.20 on 02/16/2019, last seen by Dr. [name] on 02/26/2018-which is a year overdue and the pt should have been called or given the main number to call to schedule an appt. Will deny refill and call pt to have him schedule an appt for Dr. [name].  Called the pt and spoke with the pt's wife and she states she was just about to call us to make an appt advised that he needs to adhere to appts. Advised her that we will not send in anymore Xarelto  at this time since pt needs an appt. Transferred her to the main line and gave her the number in the case she needs it.

## 2019-07-07 ENCOUNTER — Other Ambulatory Visit: Payer: Self-pay

## 2019-07-07 ENCOUNTER — Ambulatory Visit: Payer: Medicare Other | Admitting: Internal Medicine

## 2019-07-07 ENCOUNTER — Encounter: Payer: Self-pay | Admitting: Internal Medicine

## 2019-07-07 DIAGNOSIS — Z23 Encounter for immunization: Secondary | ICD-10-CM | POA: Diagnosis not present

## 2019-07-07 DIAGNOSIS — R05 Cough: Secondary | ICD-10-CM | POA: Diagnosis not present

## 2019-07-07 DIAGNOSIS — R059 Cough, unspecified: Secondary | ICD-10-CM

## 2019-07-07 DIAGNOSIS — J45991 Cough variant asthma: Secondary | ICD-10-CM | POA: Diagnosis not present

## 2019-07-07 DIAGNOSIS — R058 Other specified cough: Secondary | ICD-10-CM

## 2019-07-07 MED ORDER — BENZONATATE 200 MG PO CAPS: ORAL_CAPSULE | ORAL | 1 refills | Status: DC

## 2019-07-07 MED ORDER — AMOXICILLIN-POT CLAVULANATE 875-125 MG PO TABS: 1.0000 | ORAL_TABLET | Freq: Two times a day (BID) | ORAL | 1 refills | Status: AC

## 2019-07-07 MED ORDER — METHYLPREDNISOLONE ACETATE 80 MG/ML IJ SUSP
120.0000 mg | Freq: Once | INTRAMUSCULAR | Status: AC
  Administered 2019-07-07: 120 mg via INTRAMUSCULAR

## 2019-07-07 NOTE — Patient Instructions (Addendum)
Augmentin 875 mg take one pill twice daily  X 10 days - take at breakfast and supper with large glass of water.  It would help reduce the usual side effects (diarrhea and yeast infections) if you ate cultured yogurt at lunch.   Depomedrol 120 mg  IM today   GERD (REFLUX)  is an extremely common cause of respiratory symptoms just like yours , many times with no obvious heartburn at all.    It can be treated with medication, but also with lifestyle changes including elevation of the head of your bed (ideally with 6 -8inch blocks under the headboard of your bed),  Smoking cessation, avoidance of late meals, excessive alcohol, and avoid fatty foods, chocolate, peppermint, colas, red wine, and acidic juices such as orange juice.  NO MINT OR MENTHOL PRODUCTS SO NO COUGH DROPS  USE SUGARLESS CANDY INSTEAD (Jolley ranchers or Stover's or Life Savers) or even ice chips will also do - the key is to swallow to prevent all throat clearing. NO OIL BASED VITAMINS - use powdered substitutes.  Avoid fish oil when coughing.  For cough >  mucinex dm 1200 mg every 12 hours as needed and if still coughing add the tessalon to it.   See calendar for specific medication instructions and bring it back for each and every office visit for every healthcare provider you see.  Without it,  you may not receive the best quality medical care that we feel you deserve.  You will note that the calendar groups together  your maintenance  medications that are timed at particular times of the day.  Think of this as your checklist for what your doctor has instructed you to do until your next evaluation to see what benefit  there is  to staying on a consistent group of medications intended to keep you well.  The other group at the bottom is entirely up to you to use as you see fit  for specific symptoms that may arise between visits that require you to treat them on an as needed basis.  Think of this as your action plan or "what if" list.    Separating the top medications from the bottom group is fundamental to providing you adequate care going forward.     Please schedule a follow up office visit in 4 weeks, sooner if needed

## 2019-07-07 NOTE — Assessment & Plan Note (Signed)
Onset of cough around ? 2005 (throat clearing x decades) became daily around 2015 - Spirometry 11/15/2018  FEV1 3.1 (89%)  Ratio 0.74 s curvature ? On advair - FENO 11/15/2018  =   7 - symb 80 2bid 11/15/2018 > ? Helped but pt returned on symb 160 plus maint saba x 2 different versions so rec continue symb 160  - flared early Aug 2020 wit reported brown mucus and nl cxr > pulmonary ov 07/07/2019  rec augmentin x 10days and continue symb 160 2bid / depomedrol 120 mg IM   Symptoms are way out of proportion to objective findings noting he's clear on exam despite severe refractory cough reported.  DDX of  difficult airways management almost all start with A and  include Adherence, Ace Inhibitors, Acid Reflux, Active Sinus Disease, Alpha 1 Antitripsin deficiency, Anxiety masquerading as Airways dz,  ABPA,  Allergy(esp in young), Aspiration (esp in elderly), Adverse effects of meds,  Active smoking or vaping, A bunch of PE's (a small clot burden can't cause this syndrome unless there is already severe underlying pulm or vascular dz with poor reserve) plus two Bs  = Bronchiectasis and Beta blocker use..and one C= CHF   Adherence is always the initial "prime suspect" and is a multilayered concern that requires a "trust but verify" approach in every patient - starting with knowing how to use medications, especially inhalers, correctly, keeping up with refills and understanding the fundamental difference between maintenance and prns vs those medications only taken for a very short course and then stopped and not refilled.   - The proper method of use, as well as anticipated side effects, of a metered-dose inhaler are discussed and demonstrated to the patient. Improved effectiveness after extensive coaching during this visit to a level of approximately 75 % from a baseline of 50 % > continue  symb 160 2bid for now and prn saba  - reviewed med calendar/ updated   ? Acid (or non-acid) GERD > always difficult to exclude  as up to 75% of pts in some series report no assoc GI/ Heartburn symptoms> rec continue max (24h)  acid suppression and diet restrictions/ reviewed   -  Of the three most common causes of  Sub-acute / recurrent or chronic cough, only one (GERD)  can actually contribute to/ trigger  the other two (asthma and post nasal drip syndrome)  and perpetuate the cylce of cough.  While not intuitively obvious, many patients with chronic low grade reflux do not cough until there is a primary insult that disturbs the protective epithelial barrier and exposes sensitive nerve endings.   This is typically viral but can due to PNDS and  either may apply here.    >>> The point is that once this occurs, it is difficult to eliminate the cycle  using anything but a maximally effective acid suppression regimen at least in the short run, accompanied by an appropriate diet to address non acid GERD and control / eliminate the cough itself with combination of hard rock candy, mucinex dm and tessalon prn   ? Allergy > depomedrol 120 mg IM   ? Active sinus dz > augmentin x 10 days then ? Sinus Ct (though note MRI 02/16/19 neg for sinus dz)   ? Anxiety > usually at the bottom of this list of usual suspects but should be  higher on this pt's based on H and P and note already on psychotropics and may interfere with adherence and also interpretation of  response or lack thereof to symptom management which can be quite subjective.  ? ACEi/ arb side effects.  For reasons that may related to vascular permability and nitric oxide pathways but not elevated  bradykinin levels (as seen with  ACEi use) losartan in the generic form has been reported now from mulitple sources  to cause a similar pattern of non-specific  upper airway symptoms as seen with acei.   This has not been reported with exposure to the other ARB's to date, so it may need  try micardis 40 mg if ARB needed or use an alternative class altogether.  See:  Lelon Frohlich Allergy Asthma  Immunol  2008: 101: p [phone]    ? Adverse effects of high dose ICS.  He insists depomedrol always works so for now leave on high dose ICS but low threshold to change to lower dose   ? Beta blocker effects: In the setting of respiratory symptoms of unknown etiology,  It would be preferable to use bystolic, the most beta -1  selective Beta blocker available in sample form, with bisoprolol the most selective generic choice  on the market, at least on a trial basis, to make sure the spillover Beta 2 effects of the less specific Beta blockers are not contributing to this patient's symptoms.    I had an extended discussion with the patient/wife  reviewing all relevant studies completed to date and  lasting 25 minutes of a 40  minute acute office visit    I performed device teaching  using a teach back technique which also  extended face to face time for this visit (see above)   Each maintenance medication was reviewed in detail including most importantly the difference between maintenance and prns and under what circumstances the prns are to be triggered using an action plan format that is not reflected in the computer generated alphabetically organized AVS but trather by a customized med calendar that reflects the AVS meds with confirmed 100% correlation.   In addition, Please see AVS for unique instructions that I personally wrote and verbalized to the the pt in detail and then reviewed with pt  by my nurse highlighting any  changes in therapy recommended at today's visit to their plan of care.

## 2019-07-07 NOTE — Progress Notes (Signed)
Grant [name], male    DOB: [birth year]      MRN: [id number]    Brief patient profile:  62 yowm quit smoking 1981 with no cough or sob at that point and then developed prolonged cough in winter while working in ? [name] basement" at [facility] [employer] [facility] [employer] around 2005 and q winter since then same pattern but became more of a chronic year round pattern around 2015  Some  worse esp in winter usually better in summer mostly dry but more productive since summer of 2019 variably purulent.   Patient timeline listed below: 06/10/2018-[name]in [facility]-cough and congestion 06/25/2018-flu shot and Prevnar 07/22/2018- returned from beach with bad cough 07/26/2018- echo with Grant. Darcey [name] 08/11/2018-[name]-cough with Depo shot 08/20/2018-[name] labs 08/24/2018-[facility] [name] [facility]in clinic cough and Depo shot 09/22/18 -Grant [name] pulmonary-Z-Pak and Depo-Medrol injection 12/30/2019Sadie [name] walk-in clinic- Depo-Medrol and Levaquin      11/15/2018   1st office eval/Grant [name] re cough x acute on chronic worse since late summer 2019  And turned brown just x few weeks  Chief Complaint  Patient presents with  . Pulmonary Consult    Former patient of Grant [name]. Pt c/o cough since Sept 2019.  Cough is prod with brown sputum. He coughs the most in the morning when he wakes up. He occ wakes up in the night coughing.   assoc with sensation pnds/ best rx is depomedrol 120 mg IM  Advair rx now worse with advair and prev worse with symb 160 but hfa poor baseline zpak now Med list not accurate No increase chronic doe x  MMRC3 = can't walk 100 yards even at a slow pace at a flat grade s stopping due to sob     [name] Reflux v Neurogenic Cough Differentiator Reflux Comments  Do you awaken from a sound sleep coughing violently?                            With trouble breathing? Some nights Unless cough med    Do you have choking episodes when you cannot  Get enough air, gasping for air ?               Yes   Do you usually cough when you lie down into  The bed, or when you just lie down to rest ?                          < 45 degrees start coughing   Do you usually cough after meals or eating?         Big meal   Do you cough when (or after) you bend over?    No    GERD SCORE     [name] Reflux v Neurogenic Cough Differentiator Neurogenic   Do you more-or-less cough all day long? All day   Does change of temperature make you cough? No    Does laughing or chuckling cause you to cough? sometimes   Do fumes (perfume, automobile fumes, burned  Toast, etc.,) cause you to cough ?      no   Does speaking, singing, or talking on the phone cause you to cough   ?               Talking    Neurogenic/Airway score        No obvious other patterns in day to day or  daytime variability or assoc   mucus plugs or hemoptysis or cp or chest tightness, subjective wheeze or overt   hb symptoms ( on ppi bid but not ac)     rec If the mucus doesn't turn clear >>> omnicef 300 mg twice daily x 10 days For cough / congestion >  mucinex dm 1200 mg every 12 hours supplement with tessilon and no delsym  Depomedrol 120 mg IM  Symbicort 80 Take 2 puffs first thing in am and then another 2 puffs about 12 hours later.  Only use your levoalbuterol as a rescue medication  Please schedule a follow up office visit in 2 weeks, call sooner if needed with all medications /inhalers/ solutions in hand    11/29/2018  f/u ov/Grant [name] re: chronic throat clearing x decades/  cough worse since summer 2019 / confused with meds  Chief Complaint  Patient presents with  . Follow-up    Cough has improved slightly. He uses his xopenex and ventolin every few days.   Dyspnea: denies now but very sedentary  Cough: min improved  Sleeping: no bed blocks  SABA use: using maint alb and xopenex  02: none / cpap does  fine per Grant [name]  rec Delsym two tsp twice daily automatically  Symbicort 160 Take 2 puffs first thing in am and then  another 2 puffs about 12 hours later.  Only use your albuterol and levoalbuterol as a rescue medication Omeprazole 40 mg Take 30- 60 min before your first and last meals of the day  GERD diet  See Grant [name] w/in 2 weeks with all your medications,   - late: add add gabapentin 100 tid if still throat cleairng on delsym 2 tsp bid and tessalon prn  Improved by 12/16/18 and given medication calendar  [name] eval and recs  12/20/18 acutely worse cough  Mucinex Twice daily  As needed  Cough  Deslym 2 tsp Twice daily  As needed  Cough Tessalon Three times a day  As needed  Cough .  Try Chlortabs 4mg  Twice daily  As needed  Drainage , throat clearing , drippy nose.  Steroid injection today .   06/05/19 another flare x 10 days > zpak / depomedrol   07/07/2019  f/u ov/Grant [name] re: daily cough x 2015 best rx p ov 11/29/18 but note worse again by 12/20/18 and really never stopped throat clearing.  Chief Complaint  Patient presents with  . Acute office visit     Cough has been worse over the past few wks and is occ prod with brown sputum. He is using his albuterol inhaler 1-2 x per day on average.   Dyspnea:  Doe x 50 ft  Cough: w/in a few hours of getting out of bed starts up coughing with min mucus production slt brwon and assoc with his "usual" sense of pnds but nothing purulent from nose   Sleeping: bed blocks and cpap tends to sleep fine  SABA use: up to twice daily at most plus symb 160 2bid maint and hfa 75% effective technique  02: none   No obvious other patterns in terms day to day or daytime variability or assoc   mucus plugs or hemoptysis or cp or chest tightness, subjective wheeze or overt sinus or hb symptoms.   Sleeping as above  without nocturnal  or early am exacerbation  of respiratory  c/o's or need for noct saba. Also denies any obvious fluctuation of symptoms with weather or environmental changes or other  aggravating or alleviating factors except as outlined above   No unusual exposure hx or  h/o childhood pna/ asthma or knowledge of premature birth.  Current Allergies, Complete Past Medical History, Past Surgical History, Family History, and Social History were reviewed in [employer] record.  ROS  The following are not active complaints unless bolded Hoarseness, sore throat, dysphagia, dental problems, itching, sneezing,  nasal congestion or discharge of excess mucus or purulent secretions, ear ache,   fever, chills, sweats, unintended wt loss or wt gain, classically pleuritic or exertional cp,  orthopnea pnd or arm/hand swelling  or leg swelling, presyncope, palpitations, abdominal pain, anorexia, nausea, vomiting, diarrhea  or change in bowel habits or change in bladder habits, change in stools or change in urine, dysuria, hematuria,  rash, arthralgias, visual complaints, headache, numbness, weakness or ataxia or problems with walking or coordination,  change in mood or  memory.        Current Meds  Medication Sig  . acetaminophen (TYLENOL) 500 MG tablet Take 1,000 mg by mouth every 8 (eight) hours as needed for mild pain, fever or headache. Per bottle as needed  . albuterol (VENTOLIN HFA) 108 (90 Base) MCG/ACT inhaler INHALE 2 PUFFS INTO LUNGS EVERY 6 HOURS AS NEEDED FOR WHEEZING OR SHORTNESS OF BREATH (Patient taking differently: Inhale 2 puffs into the lungs every 6 (six) hours as needed for wheezing or shortness of breath. )  . amoxicillin (AMOXIL) 500 MG capsule Take 2,000 mg by mouth See admin instructions. 1 hour prior to dental procedure  . azelastine (ASTELIN) 137 MCG/SPRAY nasal spray Place 1-2 sprays into the nose 2 (two) times daily as needed for rhinitis or allergies.   . benzonatate (TESSALON) 200 MG capsule Take 1 capsule by mouth every 8 hours as needed for cough  . budesonide-formoterol (SYMBICORT) 160-4.5 MCG/ACT inhaler INHALE 2 PUFFS BY MOUTH TWICE DAILY  . cetirizine (ZYRTEC) 10 MG tablet Take 10 mg by mouth daily.  Marland Kitchen COLCRYS 0.6 MG tablet  Take 0.6 mg by mouth daily.   Marland Kitchen dextromethorphan (DELSYM) 30 MG/5ML liquid Take 15 mg by mouth 2 (two) times daily as needed for cough.   . Dextromethorphan-guaiFENesin (MUCINEX DM MAXIMUM STRENGTH) 60-1200 MG TB12 Take 1 tablet by mouth 2 (two) times daily as needed (cough).   Marland Kitchen DHEA 50 MG TABS Take 50 mg by mouth daily with breakfast.   . diphenhydrAMINE (BENADRYL) 25 MG tablet Take 50 mg by mouth every 6 (six) hours as needed (tongue swelling due to Perphenazine).  Marland Kitchen EPINEPHrine 0.3 mg/0.3 mL IJ SOAJ injection Inject 0.3 mLs (0.3 mg total) into the muscle as needed (for angioedema).  . famotidine (PEPCID) 20 MG tablet  not taking   . Ferrous Sulfate ([employer]S SLOW RELEASE IRON PO) Take 45 mg by mouth 2 (two) times daily.  . finasteride (PROSCAR) 5 MG tablet Take 5 mg by mouth at bedtime.   Marland Kitchen levothyroxine (SYNTHROID, LEVOTHROID) 112 MCG tablet Take 112 mcg by mouth daily before breakfast.   . liothyronine (CYTOMEL) 25 MCG tablet Take 25 mcg by mouth daily.   Marland Kitchen losartan (COZAAR) 50 MG tablet TAKE 1 TABLET(50 MG) BY MOUTH DAILY  . metoprolol tartrate (LOPRESSOR) 50 MG tablet Take 1 tablet (50 mg total) by mouth 2 (two) times daily. Please make overdue appt with Grant. [name] before anymore refills. 1st attempt  . Multiple Vitamins-Minerals (ONE-A-DAY MENS HEALTH FORMULA PO) Take 1 tablet by mouth daily with breakfast.   . omeprazole (PRILOSEC) 40 MG  capsule Take 1 capsule (40 mg total) by mouth 2 (two) times daily.  . rizatriptan (MAXALT) 10 MG tablet Take 10 mg by mouth as needed for migraine. May repeat in 2 hours if needed  . tamsulosin (FLOMAX) 0.4 MG CAPS capsule Take 0.4 mg by mouth daily after supper.  . testosterone cypionate (DEPOTESTOTERONE CYPIONATE) 100 MG/ML injection Inject 200 mg into the muscle every Thursday. For IM use only   . venlafaxine (EFFEXOR-XR) 150 MG 24 hr capsule Take 150 mg by mouth daily with breakfast.   . XARELTO 20 MG TABS tablet TAKE 1 TABLET(20 MG) BY MOUTH DAILY WITH  SUPPER. FOLLOW-UP NEEDED FOR FUTURE REFILLS.             Objective:      amb chronically ill appearing tensely obese wm harsh upper airway pattern cough     Vital signs reviewed - Note on arrival 02 sats  99% on RA    07/07/2019       265   11/29/18 272 lb (123.4 kg)  11/15/18 274 lb (124.3 kg)  11/08/18 267 lb (121.1 kg)       HEENT : pt wearing mask not removed for exam due to covid -19 concerns.    NECK :  without JVD/Nodes/TM/ nl carotid upstrokes bilaterally   LUNGS: no acc muscle use,  Nl contour chest which is clear to A and P bilaterally without cough on insp or exp maneuvers   CV:  RRR  no s3 or murmur or increase in P2, and no edema   ABD:  Tensely obese/ soft and nontender with nl inspiratory excursion in the supine position. No bruits or organomegaly appreciated, bowel sounds nl  MS:  Nl gait/ ext warm without deformities, calf tenderness, cyanosis or clubbing No obvious joint restrictions   SKIN: warm and dry without lesions    NEURO:  alert, approp, nl sensorium with  no motor or cerebellar deficits apparent.        I personally reviewed images and agree with radiology impression as follows:  CXR:   06/08/19  Status post aortic valve replacement. Heart borderline enlarged. No edema or consolidation. Assessment

## 2019-07-07 NOTE — Assessment & Plan Note (Signed)
Body mass index is 38.02 kg/m.  -  trending down, encouraged Lab Results  Component Value Date   TSH 0.951 02/16/2019     Contributing to gerd risk/ doe/reviewed the need and the process to achieve and maintain neg calorie balance > defer f/u primary care including intermittently monitoring thyroid status

## 2019-07-08 DIAGNOSIS — N5201 Erectile dysfunction due to arterial insufficiency: Secondary | ICD-10-CM | POA: Diagnosis not present

## 2019-07-08 DIAGNOSIS — R350 Frequency of micturition: Secondary | ICD-10-CM | POA: Diagnosis not present

## 2019-07-08 DIAGNOSIS — N401 Enlarged prostate with lower urinary tract symptoms: Secondary | ICD-10-CM | POA: Diagnosis not present

## 2019-07-27 ENCOUNTER — Ambulatory Visit: Payer: Medicare Other | Admitting: Cardiovascular Disease

## 2019-07-28 DIAGNOSIS — G4733 Obstructive sleep apnea (adult) (pediatric): Secondary | ICD-10-CM | POA: Diagnosis not present

## 2019-08-02 ENCOUNTER — Other Ambulatory Visit: Payer: Self-pay | Admitting: Cardiovascular Disease

## 2019-08-02 NOTE — Telephone Encounter (Signed)
Xarelto 20mg  refill request received; pt is 68 years old, weight-120.2kg, Crea-1.20 on 02/16/2019, last seen by Dr. [name] on 02/26/2018 and has an appt on scheduled for 08/23/2019, Diagnosis-Afib, CrCl-120.110ml/min; Dose is appropriate based on dosing criteria. Will send in 30 day supply with 1 refill to the requested pharmacy.

## 2019-08-09 ENCOUNTER — Other Ambulatory Visit: Payer: Self-pay | Admitting: Cardiovascular Disease

## 2019-08-15 ENCOUNTER — Other Ambulatory Visit: Payer: Self-pay | Admitting: Internal Medicine

## 2019-08-15 ENCOUNTER — Encounter: Payer: Self-pay | Admitting: Neurology

## 2019-08-15 NOTE — Progress Notes (Signed)
Cardiology Office Note    Date:  08/23/2019   ID:  LEVERNE [name], DOB 1951-04-21, MRN [id number]  PCP:  Shirline Frees, MD  Cardiologist: Dr. [name]  EP: Dr. [name]  Chief Complaint: PAF/AVR   History of Present Illness:   Grant [name] is a 68 y.o. male with paroxysmal atrial fibrillation, hypertension, OSA on CPAPand bicuspid aortic valve s/p Bentall and bioprosthetic (96mm Edwards pericardial Magna Ease)  No CAD at cath done 06/12/17 prior to surgery   H/o bicuspid aortic valve w/ aortic insuffiencey and dilated aortic root, normal coronaries by cath and normal LVEF s/p  Bentall Procedure by Dr. Prescott Gum 07/02/17 with 28 mm graft and 25 mm edwards pericardial magna ease valve  Post operative course was complicated by atrial fibrillation w/ RVR, treated with amiodarone and coumadin. He also had post-operative delirium. He was discharged home on 07/10/17 and was in NSR by time of discharge. He was seen by CT surgery on 07/15/17 and still having issues with delirium, which was felt related to amiodarone. This was discontinued and he was continued on Coreg. Delirium improved but may have been from tramadol not amiodarone  Had recurrent PAF 07/28/17 converted with iv cardizem and seen by EP Dr [name] who recommended amiodarone 200 mg daily and changed back to  xarelto for anticoagulation   January and April had what appears to be angioedema. In January noted tongue swelling seen [facility] ER and given steroids and benadryl  Was not On ACE or ARB He does not tolerate oral steroids and given zyrtec pepcid and steroid injection Told to discuss with primary stopping Tilafon (antipsychoitic)  and Proscar which have been implicated in angioedema Had recurrence off amiodarone so clearly not this   Echo 08/05/18 EF 60-65%  AVR mean gradient 16 peak 33 mmHg no AR  02/17/19 admitted with with confusion Seen by neurology with negative EEG And no acute abnormalities on MRI ? Global amnesia episode    Staying at [facility] a lot. No cardiac complaints Takes amoxacillin prior to dentist  Past Medical History:  Diagnosis Date  . Allergic rhinitis   . Anxiety   . Arthritis   . Asthma   . Bladder tumor   . Chronic fatigue   . Depression    06/30/17 Pt denies being depressed, reports Effexor is taken for Chronic Fatigue   . Dyspnea   . Enlarged prostate   . Fibromyalgia   . GERD (gastroesophageal reflux disease)   . Headache   . History of chronic bronchitis   . History of migraine   . History of toxic encephalopathy   . Hypothyroidism   . OSA on CPAP    CPAP 14  . PAF (paroxysmal atrial fibrillation) ([place]) CARDIOLOGIST -- DR [name]   DX OCT 2013  . 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 Unspecified essential hypertension   . Urethral tumor    PROSTATIC    Past Surgical History:  Procedure Laterality Date  . BENTALL PROCEDURE N/A 07/02/2017   Procedure: BENTALL PROCEDURE;  Surgeon: Ivin Poot, MD;  Location: [place];  Service: Open Heart Surgery;  Laterality: N/A;  WITH CIRC ARREST  . CARDIAC SURGERY    . COLONOSCOPY    . CYSTOSCOPY W/ RETROGRADES Bilateral 06/15/2013   Procedure: CYSTOSCOPY WITH BILATERAL RETROGRADE PYELOGRAM  BLADDER BIOPSY, PROSTATIC URETHRAL BIOPSY, ;  Surgeon: Molli Hazard, MD;  Location: [facility];  Service: Urology;  Laterality: Bilateral;  . ESOPHAGOGASTRODUODENOSCOPY (EGD) WITH PROPOFOL  N/A 03/11/2018   Procedure: ESOPHAGOGASTRODUODENOSCOPY (EGD) WITH PROPOFOL;  Surgeon: Clarene Essex, MD;  Location: [place];  Service: Endoscopy;  Laterality: N/A;  . LAPAROSCOPIC CHOLECYSTECTOMY  01-14-2001  . RIGHT/LEFT HEART CATH AND CORONARY ANGIOGRAPHY N/A 06/12/2017   Procedure: RIGHT/LEFT HEART CATH AND CORONARY ANGIOGRAPHY;  Surgeon: Larey Dresser, MD;  Location: [place] CV [facility];  Service: Cardiovascular;  Laterality: N/A;  . TEE WITHOUT CARDIOVERSION N/A 07/02/2017   Procedure: TRANSESOPHAGEAL ECHOCARDIOGRAM (TEE);  Surgeon: Prescott Gum, Collier Salina, MD;  Location: [place];  Service: Open Heart Surgery;  Laterality: N/A;  . UMBILICAL HERNIA REPAIR  01-14-2008    Current Medications: Prior to Admission medications   Medication Sig Start Date End Date Taking? Authorizing Provider  acetaminophen (TYLENOL) 500 MG tablet Take 500 mg by mouth every 6 (six) hours as needed for moderate pain.    [provider]  albuterol (VENTOLIN HFA) 108 (90 Base) MCG/ACT inhaler INHALE 2 PUFFS INTO LUNGS EVERY 6 HOURS AS NEEDED FOR WHEEZING OR SHORTNESS OF BREATH 01/19/17   Baird Lyons D, MD  amiodarone (PACERONE) 200 MG tablet Take 1 tablet (200 mg total) by mouth daily. 07/29/17   Leanor Kail, PA  aspirin 81 MG EC tablet Take 1 tablet (81 mg total) by mouth daily. 07/13/17   Gold, Wilder Glade, PA-C  azelastine (ASTELIN) 137 MCG/SPRAY nasal spray Place 1-2 sprays into the nose 2 (two) times daily as needed for rhinitis or allergies.     [provider]  benzonatate (TESSALON) 200 MG capsule Take 1 capsule (200 mg total) by mouth every 6 (six) hours as needed for cough. 11/19/16   Baird Lyons D, MD  budesonide-formoterol (SYMBICORT) 160-4.5 MCG/ACT inhaler INHALE 2 PUFF INTO THE LUNGS 2 TIMES A DAY. 01/19/17   Baird Lyons D, MD  Cholecalciferol (VITAMIN D-3) 5000 UNITS TABS Take 5,000 Units by mouth daily with breakfast.     [provider]  COLCRYS 0.6 MG tablet Take 0.6 mg by mouth daily. With breakfast 01/29/12   [provider]  Cyanocobalamin (VITAMIN B12 SL) Place 1,200 mcg under the tongue daily. 1 dropper    [provider]  dextromethorphan (DELSYM) 30 MG/5ML liquid Take 30 mg by mouth at bedtime as needed for cough.     [provider]  DHEA 50 MG TABS Take 50 mg by mouth daily with breakfast.     [provider]  fexofenadine (ALLEGRA) 180 MG tablet Take 180 mg by mouth daily with breakfast.     [provider]  finasteride (PROSCAR) 5 MG tablet Take 5 mg by mouth  at bedtime.  01/14/14   [provider]  Hypromellose Vertell Limber ALLERGY NASAL SPRAY NA) Place 1 spray into both nostrils daily as needed (for allergies).     [provider]  levothyroxine (SYNTHROID, LEVOTHROID) 112 MCG tablet Take 112 mcg by mouth daily before breakfast.     [provider]  liothyronine (CYTOMEL) 25 MCG tablet Take 25 mcg by mouth every morning.     [provider]  magic mouthwash w/lidocaine SOLN Take 10 mLs by mouth 4 (four) times daily as needed for mouth pain. 07/16/17   Deneise Lever, MD  Menthol, Topical Analgesic, (BLUE-EMU MAXIMUM STRENGTH EX) Apply 1 application topically 4 (four) times daily as needed (for arthritis pain.).    [provider]  metoprolol tartrate (LOPRESSOR) 50 MG tablet Take 1 tablet (50 mg total) by mouth 2 (two) times daily. 07/29/17   Leanor Kail,  PA  milk thistle 175 MG tablet Take 175 mg by mouth daily.    [provider]  Multiple Vitamins-Minerals (ONE-A-DAY MENS HEALTH FORMULA PO) Take 1 tablet by mouth daily with breakfast.     [provider]  omeprazole (PRILOSEC) 40 MG capsule Take 1 capsule (40 mg total) by mouth 2 (two) times daily. 10/31/16   Josue Hector, MD  perphenazine (TRILAFON) 2 MG tablet Take 2 mg by mouth 2 (two) times daily as needed (for migraines).     [provider]  Probiotic Product (ALIGN) 4 MG CAPS Take 4 mg by mouth daily with breakfast.     [provider]  tamsulosin (FLOMAX) 0.4 MG CAPS capsule Take 0.4 mg by mouth daily after supper.    [provider]  testosterone cypionate (DEPOTESTOTERONE CYPIONATE) 100 MG/ML injection Inject 200 mg into the muscle every Thursday. For IM use only     [provider]  tetrahydrozoline-zinc (VISINE-AC) 0.05-0.25 % ophthalmic solution Place 1 drop into both eyes as needed (dry eyes).    [provider]  venlafaxine (EFFEXOR-XR) 150 MG 24 hr capsule Take 150 mg by  mouth every morning.     [provider]  warfarin (COUMADIN) 2.5 MG tablet Take 1 tablet (2.5 mg total) by mouth daily at 6 PM. Take 7.5mg  (3 tablet tonight) and 5mg  tomorrow and INR check Friday Patient taking differently: Take 2.5 mg by mouth daily at 6 PM. Take 7.5mg  (3 tablet tonight) and 5mg  tomorrow and INR check Friday and as directed 07/29/17   Leanor Kail, PA    Allergies:   Perphenazine, Amiodarone, Codeine, and Tramadol   Social History   Socioeconomic History  . Marital status: Married    Spouse name: Not on file  . Number of children: Not on file  . Years of education: Not on file  . Highest education level: Not on file  Occupational History  . Occupation: disabled    Comment: owner of buisness  Social Needs  . Financial resource strain: Not on file  . Food insecurity    Worry: Not on file    Inability: Not on file  . Transportation needs    Medical: Not on file    Non-medical: Not on file  Tobacco Use  . Smoking status: Former Smoker    Packs/day: 0.50    Years: 27.00    Pack years: 13.50    Types: Cigarettes    Quit date: 10/21/1979    Years since quitting: 39.8  . Smokeless tobacco: Never Used  Substance and Sexual Activity  . Alcohol use: Yes    Alcohol/week: 4.0 standard drinks    Types: 4 Cans of beer per week    Comment: drinks "as much as I could" - 2-3 beers up to 8-10  . Drug use: No  . Sexual activity: Not on file  Lifestyle  . Physical activity    Days per week: Not on file    Minutes per session: Not on file  . Stress: Not on file  Relationships  . Social Herbalist on phone: Not on file    Gets together: Not on file    Attends religious service: Not on file    Active member of club or organization: Not on file    Attends meetings of clubs or organizations: Not on file    Relationship status: Not on file  Other Topics Concern  . Not on file  Social History Narrative  .  Not on file     Family History:  The  patient's family history includes Aortic aneurysm (age of onset: 35) in his mother; Heart disease in an other family member; Other in his father.   ROS:   Please see the history of present illness.    ROS All other systems reviewed and are negative.   PHYSICAL EXAM:   VS:  BP 126/82   Pulse 86   Ht 5\' 10"  (1.778 m)   Wt 272 lb (123.4 kg)   SpO2 99%   BMI 39.03 kg/m    Affect appropriate Obese white male  HEENT: normal Neck supple with no adenopathy JVP normal no bruits no thyromegaly Lungs clear with no wheezing and good diaphragmatic motion Heart:  S1/S2 SEM through AVR no AR  murmur, no rub, gallop or click PMI normal post sternotomy  Abdomen: benighn, BS positve, no tenderness, no AAA no bruit.  No HSM or HJR Distal pulses intact with no bruits No edema Neuro non-focal Skin warm and dry No muscular weakness   Wt Readings from Last 3 Encounters:  08/23/19 272 lb (123.4 kg)  07/07/19 265 lb (120.2 kg)  06/08/19 262 lb (118.8 kg)      Studies/Labs Reviewed:   EKG:  EKG is ordered today.  The ekg ordered today demonstrates sinus rhythm with T wave inversion in inferior lead and lead V6.  Recent Labs: 02/16/2019: ALT 37; BUN 14; Creatinine, Ser 1.20; Hemoglobin 14.7; Platelets 202; Potassium 4.0; Sodium 138; TSH 0.951   Lipid Panel    Component Value Date/Time   CHOL 185 02/17/2019 0714   CHOL 184 05/05/2017 0931   TRIG 234 (H) 02/17/2019 0714   HDL 42 02/17/2019 0714   HDL 45 05/05/2017 0931   CHOLHDL 4.4 02/17/2019 0714   VLDL 47 (H) 02/17/2019 0714   LDLCALC 96 02/17/2019 0714   LDLCALC 94 05/05/2017 0931    Additional studies/ records that were reviewed today include:  As above   ASSESSMENT & PLAN:    1. PAF - on xarelto amiodarone stopped   2. HTN - Stable on current regimen   3. 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 Prescott Gum 07/02/17. Post op echo with  stable gradients no AR  Amoxacillin called in for SBE  4. OSA -Compliant with CPAP.  5. Angioedema:  Recurrent January and April of this year Not on ACE/ARB Called in 2 epi pens for him has f/u with allergist cannot take oral prednisone due to MS changes   6. Global Amnesia:  F/u neurology MRI/EEG ok     [name]

## 2019-08-23 ENCOUNTER — Ambulatory Visit: Payer: Medicare Other | Admitting: Cardiovascular Disease

## 2019-08-23 ENCOUNTER — Encounter: Payer: Self-pay | Admitting: Cardiovascular Disease

## 2019-08-23 ENCOUNTER — Other Ambulatory Visit: Payer: Self-pay

## 2019-08-23 VITALS — BP 126/82 | HR 86 | Ht 70.0 in | Wt 272.0 lb

## 2019-08-23 DIAGNOSIS — Z952 Presence of prosthetic heart valve: Secondary | ICD-10-CM | POA: Diagnosis not present

## 2019-08-23 NOTE — Patient Instructions (Addendum)
Medication Instructions:   *If you need a refill on your cardiac medications before your next appointment, please call your pharmacy*  Lab Work:  If you have labs (blood work) drawn today and your tests are completely normal, you will receive your results only by: Marland Kitchen MyChart Message (if you have MyChart) OR . A paper copy in the mail If you have any lab test that is abnormal or we need to change your treatment, we will call you to review the results.  Testing/Procedures: None ordered  Follow-Up: At [facility], you and your health needs are our priority.  As part of our continuing mission to provide you with exceptional heart care, we have created designated Provider Care Teams.  These Care Teams include your primary Cardiologist (physician) and Advanced Practice Providers (APPs -  Physician Assistants and Nurse Practitioners) who all work together to provide you with the care you need, when you need it.  Your next appointment:   6 months  The format for your next appointment:   In Person  Provider:   You may see Dr. [name] or one of the following Advanced Practice Providers on your designated Care Team:    Truitt Merle, NP  Cecilie Kicks, NP  Kathyrn Drown, NP

## 2019-08-26 NOTE — Telephone Encounter (Signed)
MW please advise.  Thanks.  

## 2019-08-26 NOTE — Telephone Encounter (Signed)
Ov with all meds asap 

## 2019-09-01 ENCOUNTER — Other Ambulatory Visit: Payer: Self-pay | Admitting: Cardiothoracic Surgery

## 2019-09-01 DIAGNOSIS — I712 Thoracic aortic aneurysm, without rupture, unspecified: Secondary | ICD-10-CM

## 2019-09-07 ENCOUNTER — Other Ambulatory Visit: Payer: Self-pay | Admitting: Cardiothoracic Surgery

## 2019-09-14 ENCOUNTER — Other Ambulatory Visit: Payer: Self-pay | Admitting: *Deleted

## 2019-09-14 MED ORDER — RIVAROXABAN 20 MG PO TABS: 20.0000 mg | ORAL_TABLET | Freq: Every day | ORAL | 5 refills | Status: DC

## 2019-09-14 NOTE — Telephone Encounter (Signed)
Xarelto 20mg  refill request received. Pt is 68 years old, weight-123.4kg, Crea-1.20 on 02/16/2019, last seen by Dr. [name] on 08/23/2019, Diagnosis-Afib, CrCl-105.62ml/min; Dose is appropriate based on dosing criteria. Will send in refill to requested pharmacy.

## 2019-10-03 ENCOUNTER — Other Ambulatory Visit: Payer: Self-pay

## 2019-10-03 MED ORDER — METOPROLOL TARTRATE 50 MG PO TABS: 50.0000 mg | ORAL_TABLET | Freq: Two times a day (BID) | ORAL | 3 refills | Status: DC

## 2019-10-05 ENCOUNTER — Other Ambulatory Visit: Payer: Medicare Other

## 2019-10-05 ENCOUNTER — Telehealth: Payer: Self-pay | Admitting: Neurology

## 2019-10-05 ENCOUNTER — Ambulatory Visit: Payer: Medicare Other | Admitting: Cardiothoracic Surgery

## 2019-10-05 NOTE — Telephone Encounter (Signed)
He has been really tired with his chronic fatigue, has had more migraines, then in the afternoon of 12/14, feeling really tired and went to dinner, a little unsteady, had a good dinner, then went away for a while, eat slow, stop, look around a little bit, not participating much with conversation at tale (usually animated). Christmas music playing and he said, it's this Dec? Then did not know what date/time it was. He followed commands and no focal weakness, responsive. Unsteady on feet, trouble getting into house, no focal weakness. Headache was not much worse but he was squinting at wife. He was vague when they got home, said he did not want to go to hospital. Nurse friend came and he started coming out of while she was there, 142/88, HR 67. Steadily getting better, today much better. Has had a lot going on, very busy, a lot of stressful things going on. Discussed doing a 24-hour EEG but she feels he will not tolerate it with his CPAP (asking for information).  Grant [name], [phone]

## 2019-10-07 ENCOUNTER — Ambulatory Visit: Payer: Medicare Other | Admitting: Cardiothoracic Surgery

## 2019-10-07 NOTE — Telephone Encounter (Signed)
LMOM to call back if he wants to schedule appointment this coming Monday Dec 21st at 2:30. I mentioned they could even call Monday morning to see if the 2:30 appointment is still available. Let her know that many of our patients are on CPAP and the ambulatory EEG will not interfere with his ability to wear the CPAP. I mentioned that the new models are less cumbersome and should not keep him from normal activities for the most part. Said to call me back if she want to further discuss the set up.

## 2019-10-11 ENCOUNTER — Telehealth: Payer: Self-pay | Admitting: *Deleted

## 2019-10-11 DIAGNOSIS — N401 Enlarged prostate with lower urinary tract symptoms: Secondary | ICD-10-CM | POA: Diagnosis not present

## 2019-10-11 NOTE — Telephone Encounter (Signed)
Spoke with Juliann Pulse his wife. He absolutely refuses to do the ambulatory EEG. She forwarded the information from his visit with Dr. Delice Lesch to his primary care Dr. Kenton Kingfisher from [facility]. Dr. Kenton Kingfisher told them it is possible it was a compounded migraine (she is not sure if that was the term he used).  She said to tell Dr. Delice Lesch he is much better now and has felt a lot better over the last 4-5 days.

## 2019-10-13 NOTE — Telephone Encounter (Signed)
Noted  

## 2019-10-18 ENCOUNTER — Other Ambulatory Visit: Payer: Self-pay | Admitting: Cardiothoracic Surgery

## 2019-10-18 DIAGNOSIS — I712 Thoracic aortic aneurysm, without rupture, unspecified: Secondary | ICD-10-CM

## 2019-10-19 ENCOUNTER — Other Ambulatory Visit: Payer: Medicare Other

## 2019-10-19 ENCOUNTER — Ambulatory Visit: Payer: Medicare Other | Admitting: Cardiothoracic Surgery

## 2019-10-19 ENCOUNTER — Ambulatory Visit: Payer: Medicare Other | Admitting: Neurology

## 2019-10-25 ENCOUNTER — Other Ambulatory Visit: Payer: Self-pay | Admitting: *Deleted

## 2019-10-25 DIAGNOSIS — I712 Thoracic aortic aneurysm, without rupture, unspecified: Secondary | ICD-10-CM

## 2019-10-25 LAB — CREATININE, SERUM: Creat: 0.97 mg/dL (ref 0.70–1.25)

## 2019-10-26 ENCOUNTER — Encounter: Payer: Self-pay | Admitting: Cardiothoracic Surgery

## 2019-10-26 ENCOUNTER — Ambulatory Visit: Payer: Medicare Other | Admitting: Cardiothoracic Surgery

## 2019-10-26 ENCOUNTER — Other Ambulatory Visit: Payer: Self-pay

## 2019-10-26 ENCOUNTER — Ambulatory Visit
Admission: RE | Admit: 2019-10-26 | Discharge: 2019-10-26 | Disposition: A | Discharge status: Home / Self Care | Payer: Medicare Other | Source: Ambulatory Visit | Attending: Cardiothoracic Surgery | Admitting: Cardiothoracic Surgery

## 2019-10-26 DIAGNOSIS — I712 Thoracic aortic aneurysm, without rupture, unspecified: Secondary | ICD-10-CM

## 2019-10-26 DIAGNOSIS — M25512 Pain in left shoulder: Secondary | ICD-10-CM | POA: Insufficient documentation

## 2019-10-26 DIAGNOSIS — M25511 Pain in right shoulder: Secondary | ICD-10-CM

## 2019-10-26 DIAGNOSIS — G8929 Other chronic pain: Secondary | ICD-10-CM

## 2019-10-26 DIAGNOSIS — G4733 Obstructive sleep apnea (adult) (pediatric): Secondary | ICD-10-CM | POA: Diagnosis not present

## 2019-10-26 DIAGNOSIS — I700 Atherosclerosis of aorta: Secondary | ICD-10-CM | POA: Diagnosis not present

## 2019-10-26 MED ORDER — IOPAMIDOL (ISOVUE-370) INJECTION 76%
75.0000 mL | Freq: Once | INTRAVENOUS | Status: AC | PRN
  Administered 2019-10-26: 75 mL via INTRAVENOUS

## 2019-10-26 NOTE — Progress Notes (Signed)
PCP is Shirline Frees, MD Referring Provider is Josue Hector, MD  Chief Complaint  Patient presents with  . Follow-up    s/p Bentall 06/2017, CTA today    HPI: Patient turns for 2-year follow-up after biologic Bentall procedure with a 25 mm pericardial tissue valve for large ascending aneurysm with AI.  His blood pressure overall has been well controlled with losartan 50 mg daily and metoprolol titrate 50 mg twice daily.  He had an echocardiogram 1 year postop which showed normal LV function, normal aortic valve function with 15 mm mean gradient, no AI.  He has history of PAF and is on Xarelto but maintains sinus rhythm.  He complains currently of migraine headaches, upper back and neck discomfort, weakness in his posterior shoulder joint and intermittent episodes of short-term memory loss.  He plans on seeing a chiropractor for the upper back pain which he has had since the sternotomy.  I personally reviewed the images of his CTA today which show intact graft repair without pseudoaneurysm and widely patent arch vessels. 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  Sternal wires intact.  Past Medical History:  Diagnosis Date  . Allergic rhinitis   . Anxiety   . Arthritis   . Asthma   . Bladder tumor   . Chronic fatigue   . Depression    06/30/17 Pt denies being depressed, reports Effexor is taken for Chronic Fatigue   . Dyspnea   . Enlarged prostate   . Fibromyalgia   . GERD (gastroesophageal reflux disease)   . Headache   . History of chronic bronchitis   . History of migraine   . History of toxic encephalopathy   . Hypothyroidism   . OSA on CPAP    CPAP 14  . PAF (paroxysmal atrial fibrillation) ([place]) CARDIOLOGIST -- DR [name]   DX OCT 2013  . 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 Unspecified essential hypertension   . Urethral tumor    PROSTATIC    Past Surgical History:  Procedure Laterality Date  . BENTALL PROCEDURE N/A 07/02/2017    Procedure: BENTALL PROCEDURE;  Surgeon: Ivin Poot, MD;  Location: [place];  Service: Open Heart Surgery;  Laterality: N/A;  WITH CIRC ARREST  . CARDIAC SURGERY    . COLONOSCOPY    . CYSTOSCOPY W/ RETROGRADES Bilateral 06/15/2013   Procedure: CYSTOSCOPY WITH BILATERAL RETROGRADE PYELOGRAM  BLADDER BIOPSY, PROSTATIC URETHRAL BIOPSY, ;  Surgeon: Molli Hazard, MD;  Location: [facility];  Service: Urology;  Laterality: Bilateral;  . ESOPHAGOGASTRODUODENOSCOPY (EGD) WITH PROPOFOL N/A 03/11/2018   Procedure: ESOPHAGOGASTRODUODENOSCOPY (EGD) WITH PROPOFOL;  Surgeon: Clarene Essex, MD;  Location: [place];  Service: Endoscopy;  Laterality: N/A;  . LAPAROSCOPIC CHOLECYSTECTOMY  01-14-2001  . RIGHT/LEFT HEART CATH AND CORONARY ANGIOGRAPHY N/A 06/12/2017   Procedure: RIGHT/LEFT HEART CATH AND CORONARY ANGIOGRAPHY;  Surgeon: Larey Dresser, MD;  Location: [place] [facility];  Service: Cardiovascular;  Laterality: N/A;  . TEE WITHOUT CARDIOVERSION N/A 07/02/2017   Procedure: TRANSESOPHAGEAL ECHOCARDIOGRAM (TEE);  Surgeon: Prescott Gum, Collier Salina, MD;  Location: [place];  Service: Open Heart Surgery;  Laterality: N/A;  . UMBILICAL HERNIA REPAIR  01-14-2008    Family History  Problem Relation Age of Onset  . Aortic aneurysm Mother 16       cause of death  . Other Father        motor vehicle accident  . Heart disease Other  family history    Social History Social History   Tobacco Use  . Smoking status: Former Smoker    Packs/day: 0.50    Years: 27.00    Pack years: 13.50    Types: Cigarettes    Quit date: 10/21/1979    Years since quitting: 40.0  . Smokeless tobacco: Never Used  Substance Use Topics  . Alcohol use: Yes    Alcohol/week: 4.0 standard drinks    Types: 4 Cans of beer per week    Comment: drinks "as much as I could" - 2-3 beers up to 8-10  . Drug use: No    Current Outpatient Medications  Medication Sig Dispense Refill  . acetaminophen (TYLENOL)  500 MG tablet Take 1,000 mg by mouth every 8 (eight) hours as needed for mild pain, fever or headache. Per bottle as needed    . albuterol (VENTOLIN HFA) 108 (90 Base) MCG/ACT inhaler INHALE 2 PUFFS INTO LUNGS EVERY 6 HOURS AS NEEDED FOR WHEEZING OR SHORTNESS OF BREATH (Patient taking differently: Inhale 2 puffs into the lungs every 6 (six) hours as needed for wheezing or shortness of breath. ) 18 g 12  . amoxicillin (AMOXIL) 500 MG capsule Take 2,000 mg by mouth See admin instructions. 1 hour prior to dental procedure    . azelastine (ASTELIN) 137 MCG/SPRAY nasal spray Place 1-2 sprays into the nose 2 (two) times daily as needed for rhinitis or allergies.     . benzonatate (TESSALON) 200 MG capsule Take 1 capsule by mouth every 8 hours as needed for cough 40 capsule 1  . cetirizine (ZYRTEC) 10 MG tablet Take 10 mg by mouth daily.    Marland Kitchen COLCRYS 0.6 MG tablet Take 0.6 mg by mouth daily.     Marland Kitchen Dextromethorphan-guaiFENesin (MUCINEX DM MAXIMUM STRENGTH) 60-1200 MG TB12 Take 1 tablet by mouth 2 (two) times daily as needed (cough).     Marland Kitchen DHEA 50 MG TABS Take 50 mg by mouth daily with breakfast.     . EPINEPHrine 0.3 mg/0.3 mL IJ SOAJ injection Inject 0.3 mLs (0.3 mg total) into the muscle as needed (for angioedema). 2 Device 0  . Ferrous Sulfate ([employer] SLOW RELEASE IRON PO) Take 45 mg by mouth 2 (two) times daily.    . finasteride (PROSCAR) 5 MG tablet Take 5 mg by mouth at bedtime.     Marland Kitchen levothyroxine (SYNTHROID, LEVOTHROID) 112 MCG tablet Take 112 mcg by mouth daily before breakfast.     . liothyronine (CYTOMEL) 25 MCG tablet Take 25 mcg by mouth daily.     Marland Kitchen losartan (COZAAR) 50 MG tablet TAKE 1 TABLET(50 MG) BY MOUTH DAILY 30 tablet 6  . metoprolol tartrate (LOPRESSOR) 50 MG tablet Take 1 tablet (50 mg total) by mouth 2 (two) times daily. 180 tablet 3  . Multiple Vitamins-Minerals (ONE-A-DAY MENS HEALTH FORMULA PO) Take 1 tablet by mouth daily with breakfast.     . omeprazole (PRILOSEC) 40 MG capsule  Take 1 capsule (40 mg total) by mouth 2 (two) times daily. 180 capsule 1  . rivaroxaban (XARELTO) 20 MG TABS tablet Take 1 tablet (20 mg total) by mouth daily with supper. 30 tablet 5  . rizatriptan (MAXALT) 10 MG tablet Take 10 mg by mouth as needed for migraine. May repeat in 2 hours if needed    . SYMBICORT 160-4.5 MCG/ACT inhaler INHALE 2 PUFFS BY MOUTH TWICE DAILY 10.2 g 5  . tamsulosin (FLOMAX) 0.4 MG CAPS capsule Take 0.4 mg by mouth daily  after supper.    . testosterone cypionate (DEPOTESTOTERONE CYPIONATE) 100 MG/ML injection Inject 200 mg into the muscle every Thursday. For IM use only     . venlafaxine (EFFEXOR-XR) 150 MG 24 hr capsule Take 150 mg by mouth daily with breakfast.      No current facility-administered medications for this visit.    Allergies  Allergen Reactions  . Perphenazine Other (See Comments)    Tongue swelling  . Amiodarone Swelling  . Codeine Itching  . Tramadol Other (See Comments)    dellusion     Review of Systems  No fever No symptoms of Covid sinusitis or bronchitis No ankle edema No chest pain or shortness of breath Positive for migraine headaches No active dental complaints no difficulty swallowing No abdominal pain symptoms of his episode of diverticulitis a year ago.  BP (!) 160/92 (BP Location: Right Arm, Patient Position: Sitting, Cuff Size: Normal)   Pulse 70   Temp 99.1 F (37.3 C) (Skin)   Resp 20   Ht 5\' 10"  (1.778 m)   Wt 265 lb (120.2 kg)   SpO2 96% Comment: RA  BMI 38.02 kg/m  Physical Exam      Exam       General- alert and comfortable.  Sternal incision healed.  I feel no instability.    Neck- no JVD, no cervical adenopathy palpable, no carotid bruit   Lungs- clear without rales, wheezes   Cor- regular rate and rhythm, no murmur , gallop   Abdomen- soft, non-tender   Extremities - warm, non-tender, minimal edema   Neuro- oriented, appropriate, no focal weakness   Diagnostic Tests: CT images personally reviewed  as noted above. Aortic repair intact without pseudoaneurysm. Impression: Doing well 2 years after biologic Bentall procedure. He needs to be committed to good blood pressure control and will check his blood pressures regularly at home.  Goal would be to keep his systolic pressure less than 140 mmHg.  He understands importance of antibiotic prophylaxis prior to any dental procedures or cleanings.  Plan: I will see the patient back in 3 months to check his blood pressure and to review his progress with his chiropractor for his upper back and shoulder discomfort.  He knows he should not lift heavy weights over his head at this point.   Len Childs, MD [facility] [phone]

## 2019-10-27 ENCOUNTER — Telehealth: Payer: Self-pay | Admitting: Neurology

## 2019-10-27 NOTE — Telephone Encounter (Signed)
Called wife to change appt to VV for 11/15/19- she said that one point yall said someone can show him how the ambulatory EEG works and she was wanting to let you know she would like that to be done during this VV. Thanks!

## 2019-10-30 ENCOUNTER — Other Ambulatory Visit: Payer: Self-pay | Admitting: Internal Medicine

## 2019-11-15 ENCOUNTER — Telehealth (INDEPENDENT_AMBULATORY_CARE_PROVIDER_SITE_OTHER): Payer: Medicare Other | Admitting: Neurology

## 2019-11-15 ENCOUNTER — Other Ambulatory Visit: Payer: Self-pay

## 2019-11-15 DIAGNOSIS — G43109 Migraine with aura, not intractable, without status migrainosus: Secondary | ICD-10-CM | POA: Diagnosis not present

## 2019-11-15 DIAGNOSIS — G454 Transient global amnesia: Secondary | ICD-10-CM | POA: Diagnosis not present

## 2019-11-15 MED ORDER — AIMOVIG 140 MG/ML ~~LOC~~ SOAJ: 1.0000 | SUBCUTANEOUS | 11 refills | Status: DC

## 2019-11-15 NOTE — Progress Notes (Signed)
Virtual Visit via Video Note The purpose of this virtual visit is to provide medical care while limiting exposure to the novel coronavirus.    Consent was obtained for video visit:  Yes.   Answered questions that patient had about telehealth interaction:  Yes.   I discussed the limitations, risks, security and privacy concerns of performing an evaluation and management service by telemedicine. I also discussed with the patient that there may be a patient responsible charge related to this service. The patient expressed understanding and agreed to proceed.  Pt location: Home Physician Location: office Name of referring provider:  Shirline Frees, MD I connected with Grant Cork Heenan at patients initiation/request on 11/15/2019 at  2:00 PM EST by video enabled telemedicine application and verified that I am speaking with the correct person using two identifiers. Pt MRN:  [id number] Pt DOB:  [birth year] Video Participants:  Grant [name];  Aura Dials (wife)   History of Present Illness:  The patient was seen as a virtual video visit on 11/15/2019. He was last seen 8 months ago in the neurology clinic after he had an episode of confusion/repeating himself last April 2020, transient global amnesia versus complicated migraine. MRI/MRA brain did not show any acute changes. There was mild chronic microvascular disease. There was scattered small vessel atherosclerotic change without significant stenosis. There was an incidental finding of a 67mm focal outpouching at the left MCA bifurcation, suspicious for aneurysm. His wake and drowsy EEG was normal. We discussed starting a migraine preventative medication, he had side effects on Topiramate. They called our office in December 2020 to report that he had been really tired with his chronic fatigue with more migraines, then on the afternoon of 12/14 he was very tired and was unsteady at dinner. He was eating slow, stopping and looking around, not  participating much in the conversation (usually animated). He heard Christmas music and asked if it was December, not knowing the date/time. He was following commands with no focal weakness, responding to questions. A nurse friend came over with note of BP 142/88, HR 67. They report they were very busy that time with a lot of stress. We had discussed doing a 24-hour EEG which he declined. No further similar symptoms since then. For the past couple of weeks, he has had more frequent headaches with sudden dizziness. He was walking the other day and got very dizzy, staggering around that she had to get the car to bring him home. He reports a sensation of movement around him, with double vision, nausea, his face gets very red. He took Maxalt and felt better after an hour. If he does not take anything, the headache can go away in an hour but he still feels sick to his stomach. He appears tired today, his wife reports his eyes start shutting when tired, he did a lot of things on the computer and did too much today. He has headaches around 4 times a month, sometimes occurring daily for a week. At times he has a feeling like he will have a migraine but it does not progress. Sleep is good with his CPAP. No falls.   History on Initial Assessment 03/02/2019: This is a 69 year old right-handed man with a history of OSA, atrial fibrillation on chronic anticoagulation, aortic valve replacement, depression, chronic fatigue syndrome/fibromyalgia, presenting after hospital admission last 02/16/2019 for altered mental status. He was in his usual state of health ("a little unusual all the time anyway"), working in his  shop when he stopped doing one thing and could not remember what he was going to do next. As this was going on, his wife came to the shop and he does not remember much until they were riding in the car to the hospital. His wife reports that he has had baseline confusion with his chronic fatigue syndrome for more than 15  years, worse when he is tired, but this day was unusual because he was looking all over the place not knowing what he was doing, and he kept asking her the same thing repeatedly. He would usually argue when he goes to the doctor, but this time did not argue, he was not as cognizant of how different/serious it was while he was in the hospital. His wife reported that he had been "bouncing off the edge of a migraine daily for 2-3 weeks prior." He was taking a lot of Tylenol, Aleve, and Maxalt. It appears he was back to baseline in the ER. Bloodwork was normal, UDS and EtOH level negative. I personally reviewed MRI/MRA brain which did not show any acute changes. There was mild chronic microvascular disease. There was scattered small vessel atherosclerotic change without significant stenosis. There was an incidental finding of a 67mm focal outpouching at the left MCA bifurcation, suspicious for aneurysm. His wake and drowsy EEG was normal. Symptoms felt due to transient global amnesia or migraine. No further recurrence of similar significant confusion.  They report a complicated history since his early 59s which has been diagnosed as chronic fatigue syndrome (CFS). He takes Effexor. His wife states it has been more than 15 years where he has been confused and kind of forgetful. 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 He would have days where he speaks and thinks better, figuring out things faster, usually when he has an adrenaline burst, then would crash for a couple of days. If he gets overstimulated or surprised by a loud sound or too much information, he would "start going somewhere else" and get confused. He got lost in a store one time. It can last 1-2 hours or a whole day before he is back to his usual self. 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 He has  had migraines since childhood, usually with stabbing pain in the left temporal or retroorbital regions. If he does not stop them, they may go on all day. He would have tunnel vision and see black shimmering objects, with nausea and light sensitivity, tunnel vision. His left eye would almost shut. He has been taking Maxalt every other day for the past few weeks. He has never been on a preventative medication. He took perphenazine in the past but it caused tongue swelling. He took it a week or so ago and his tongue swelled up again. They report balance issues for the past 1.5 years, he would have extreme fatigue where he feels like he would pass out, occasional vertical diplopia. He was in the hospital while visiting the beach in 08/2018. They fell since his cardiac surgery in 2018, "more funny things have been going on since then." He does not drive. His mother and maternal grandmother had migraines. He denies any focal numbness/tingling/weakness, dysphagia. Sleep is good with CPAP machine.   MEDICATIONS: Current Outpatient Medications on File Prior to Visit  Medication Sig Dispense Refill  . acetaminophen (  TYLENOL) 500 MG tablet Take 1,000 mg by mouth every 8 (eight) hours as needed for mild pain, fever or headache. Per bottle as needed    . albuterol (VENTOLIN HFA) 108 (90 Base) MCG/ACT inhaler INHALE 2 PUFFS INTO LUNGS EVERY 6 HOURS AS NEEDED FOR WHEEZING OR SHORTNESS OF BREATH (Patient taking differently: Inhale 2 puffs into the lungs every 6 (six) hours as needed for wheezing or shortness of breath. ) 18 g 12  . azelastine (ASTELIN) 137 MCG/SPRAY nasal spray Place 1-2 sprays into the nose 2 (two) times daily as needed for rhinitis or allergies.     . budesonide-formoterol (SYMBICORT) 160-4.5 MCG/ACT inhaler INHALE 2 PUFFS BY MOUTH TWICE DAILY 10.2 g 5  . cetirizine (ZYRTEC) 10 MG tablet Take 10 mg by mouth daily.    Marland Kitchen DHEA 50 MG TABS Take 50 mg by mouth daily with breakfast.     . Ferrous Sulfate ([employer]  SLOW RELEASE IRON PO) Take 45 mg by mouth 2 (two) times daily.    . finasteride (PROSCAR) 5 MG tablet Take 5 mg by mouth at bedtime.     Marland Kitchen levothyroxine (SYNTHROID, LEVOTHROID) 112 MCG tablet Take 112 mcg by mouth daily before breakfast.     . liothyronine (CYTOMEL) 25 MCG tablet Take 25 mcg by mouth daily.     Marland Kitchen losartan (COZAAR) 50 MG tablet Take 1 tablet (50 mg total) by mouth daily. 30 tablet 6  . metoprolol tartrate (LOPRESSOR) 50 MG tablet TAKE 1 TABLET(50 MG) BY MOUTH TWICE DAILY (Patient taking differently: Take 50 mg by mouth 2 (two) times daily. ) 60 tablet 7  . Multiple Vitamins-Minerals (ONE-A-DAY [employer]ALTH FORMULA PO) Take 1 tablet by mouth daily with breakfast.     . omeprazole (PRILOSEC) 40 MG capsule Take 1 capsule (40 mg total) by mouth 2 (two) times daily. 180 capsule 1  . rizatriptan (MAXALT) 10 MG tablet Take 10 mg by mouth as needed for migraine. May repeat in 2 hours if needed    . tamsulosin (FLOMAX) 0.4 MG CAPS capsule Take 0.4 mg by mouth daily after supper.    . testosterone cypionate (DEPOTESTOTERONE CYPIONATE) 100 MG/ML injection Inject 200 mg into the muscle every Thursday. For IM use only     . venlafaxine (EFFEXOR-XR) 150 MG 24 hr capsule Take 150 mg by mouth daily with breakfast.     . XARELTO 20 MG TABS tablet TAKE 1 TABLET(20 MG) BY MOUTH DAILY WITH SUPPER (Patient taking differently: Take 20 mg by mouth daily with supper. ) 90 tablet 1  . amoxicillin (AMOXIL) 500 MG capsule Take 2,000 mg by mouth See admin instructions. 1 hour prior to dental procedure    . benzonatate (TESSALON) 200 MG capsule Take 1 capsule by mouth every 8 hours as needed for cough (Patient not taking: Reported on 03/02/2019) 20 capsule 1  . COLCRYS 0.6 MG tablet Take 0.6 mg by mouth daily.     Marland Kitchen dextromethorphan (DELSYM) 30 MG/5ML liquid Take 15 mg by mouth 2 (two) times daily as needed for cough.     . Dextromethorphan-guaiFENesin (MUCINEX DM MAXIMUM STRENGTH) 60-1200 MG TB12 Take 1 tablet by  mouth 2 (two) times daily as needed (cough).     . diphenhydrAMINE (BENADRYL) 25 MG tablet Take 50 mg by mouth every 6 (six) hours as needed (tongue swelling due to Perphenazine).    Marland Kitchen EPINEPHrine 0.3 mg/0.3 mL IJ SOAJ injection Inject 0.3 mLs (0.3 mg total) into the muscle as needed (for angioedema). (  Patient not taking: Reported on 03/02/2019) 2 Device 0  . famotidine (PEPCID) 20 MG tablet Take 20 mg by mouth daily as needed for heartburn or indigestion.      No current facility-administered medications on file prior to visit.     Observations/Objective:   GEN:  The patient appears stated age and is in NAD, tired-appearing. Patient is awake, alert, oriented x 3. No aphasia or dysarthria. Intact fluency and comprehension. Remote and recent memory intact. Able to name and repeat. Cranial nerves: Extraocular movements intact with no nystagmus. No facial asymmetry. Motor: moves all extremities symmetrically, at least anti-gravity x 4.    Assessment and Plan:   This is a 69 yo RH man with a history of OSA, atrial fibrillation on chronic anticoagulation, aortic valve replacement, depression, chronic fatigue syndrome/fibromyalgia, who had an episode of confusion last April 2020. MRI brain and EEG unremarkable. He had another confusional episode last December 2020. We discussed differential diagnosis of complicated migraines versus seizure, he has had an increase in headaches around the time of both episodes. We discussed doing prolonged EEG, which he declines at this time. We discussed dizzy episodes with the migraines, suggestive of vertiginous migraines. Treatment options for migraine prevention discussed, he is agreeable to trying a different preventative, Aimovig. Side effects discussed. He was advised to keep a calendar of symptoms. Follow-up MRA head will be ordered in April 2021 for incidental finding of small 1mm outpouching at left MCA bifurcation suggestive of aneurysm, continue BP control.  Follow-up in 4-5 months, he knows to call for any changes.    Follow Up Instructions:   -I discussed the assessment and treatment plan with the patient. The patient was provided an opportunity to ask questions and all were answered. The patient agreed with the plan and demonstrated an understanding of the instructions.   The patient was advised to call back or seek an in-person evaluation if the symptoms worsen or if the condition fails to improve as anticipated.    Cameron Sprang, MD

## 2019-11-17 ENCOUNTER — Ambulatory Visit: Payer: Medicare Other

## 2019-11-22 ENCOUNTER — Ambulatory Visit: Payer: Medicare Other

## 2019-11-24 ENCOUNTER — Other Ambulatory Visit: Payer: Self-pay | Admitting: *Deleted

## 2019-11-24 MED ORDER — ALBUTEROL SULFATE HFA 108 (90 BASE) MCG/ACT IN AERS: INHALATION_SPRAY | RESPIRATORY_TRACT | 0 refills | Status: DC

## 2019-11-25 ENCOUNTER — Ambulatory Visit: Payer: Medicare Other

## 2019-11-26 ENCOUNTER — Ambulatory Visit: Payer: Medicare Other | Attending: Internal Medicine

## 2019-11-26 DIAGNOSIS — Z23 Encounter for immunization: Secondary | ICD-10-CM | POA: Insufficient documentation

## 2019-11-26 NOTE — Progress Notes (Signed)
   Covid-19 Vaccination Clinic  Name:  Grant [name]    MRN: [id number] DOB: 03/06/1951  11/26/2019  Grant [name] was observed post Covid-19 immunization for 15 minutes without incidence. He was provided with Vaccine Information Sheet and instruction to access the V-Safe system.   Grant [name] was instructed to call 911 with any severe reactions post vaccine: Marland Kitchen Difficulty breathing  . Swelling of your face and throat  . A fast heartbeat  . A bad rash all over your body  . Dizziness and weakness    Immunizations Administered    Name Date Dose VIS Date Route   Pfizer COVID-19 Vaccine 11/26/2019 11:56 AM 0.3 mL 09/30/2019 Intramuscular   Manufacturer: [place]   Lot: [id number]   [place]: [id number]

## 2019-11-28 DIAGNOSIS — G4733 Obstructive sleep apnea (adult) (pediatric): Secondary | ICD-10-CM | POA: Diagnosis not present

## 2019-12-01 DIAGNOSIS — E291 Testicular hypofunction: Secondary | ICD-10-CM | POA: Diagnosis not present

## 2019-12-01 DIAGNOSIS — R5383 Other fatigue: Secondary | ICD-10-CM | POA: Diagnosis not present

## 2019-12-01 DIAGNOSIS — I10 Essential (primary) hypertension: Secondary | ICD-10-CM | POA: Diagnosis not present

## 2019-12-01 DIAGNOSIS — E039 Hypothyroidism, unspecified: Secondary | ICD-10-CM | POA: Diagnosis not present

## 2019-12-08 ENCOUNTER — Encounter: Payer: Self-pay | Admitting: Cardiothoracic Surgery

## 2019-12-08 ENCOUNTER — Other Ambulatory Visit: Payer: Self-pay | Admitting: Cardiothoracic Surgery

## 2019-12-21 ENCOUNTER — Ambulatory Visit: Payer: Medicare Other | Attending: Internal Medicine

## 2019-12-21 DIAGNOSIS — Z23 Encounter for immunization: Secondary | ICD-10-CM

## 2019-12-21 NOTE — Progress Notes (Signed)
   Covid-19 Vaccination Clinic  Name:  Grant [name]    MRN: [id number] DOB: [birth year]  12/21/2019  [name] was observed post Covid-19 immunization for 15 minutes without incident. He was provided with Vaccine Information Sheet and instruction to access the V-Safe system.   [name] was instructed to call 911 with any severe reactions post vaccine: Marland Kitchen Difficulty breathing  . Swelling of face and throat  . A fast heartbeat  . A bad rash all over body  . Dizziness and weakness   Immunizations Administered    Name Date Dose VIS Date Route   Pfizer COVID-19 Vaccine 12/21/2019  9:39 AM 0.3 mL 09/30/2019 Intramuscular   Manufacturer: [place]   Lot: [id number]   [place]: [id number]

## 2019-12-22 ENCOUNTER — Other Ambulatory Visit: Payer: Self-pay | Admitting: Internal Medicine

## 2019-12-22 MED ORDER — ALBUTEROL SULFATE HFA 108 (90 BASE) MCG/ACT IN AERS: INHALATION_SPRAY | RESPIRATORY_TRACT | 5 refills | Status: DC

## 2020-01-31 DIAGNOSIS — M79641 Pain in right hand: Secondary | ICD-10-CM | POA: Diagnosis not present

## 2020-02-01 DIAGNOSIS — M9901 Segmental and somatic dysfunction of cervical region: Secondary | ICD-10-CM | POA: Diagnosis not present

## 2020-02-01 DIAGNOSIS — M5136 Other intervertebral disc degeneration, lumbar region: Secondary | ICD-10-CM | POA: Diagnosis not present

## 2020-02-01 DIAGNOSIS — M9905 Segmental and somatic dysfunction of pelvic region: Secondary | ICD-10-CM | POA: Diagnosis not present

## 2020-02-01 DIAGNOSIS — M9904 Segmental and somatic dysfunction of sacral region: Secondary | ICD-10-CM | POA: Diagnosis not present

## 2020-02-03 ENCOUNTER — Telehealth: Payer: Self-pay | Admitting: Cardiovascular Disease

## 2020-02-03 NOTE — Telephone Encounter (Signed)
Called patient's wife, patient needs a virtual visit, unless he needs to be seen. Informed patient's wife that as long as patient does not have any issues that a follow up appointment would be perfect for a virtual visit.

## 2020-02-03 NOTE — Telephone Encounter (Signed)
New Message    Pts wife is calling and is wondering if the pt can do a virtual visit or if he has to be seen in the office     Please advise

## 2020-02-06 ENCOUNTER — Other Ambulatory Visit: Payer: Self-pay | Admitting: *Deleted

## 2020-02-07 ENCOUNTER — Other Ambulatory Visit: Payer: Self-pay | Admitting: Cardiothoracic Surgery

## 2020-02-07 ENCOUNTER — Other Ambulatory Visit: Payer: Self-pay

## 2020-02-07 ENCOUNTER — Encounter: Payer: Self-pay | Admitting: Dermatology

## 2020-02-07 ENCOUNTER — Ambulatory Visit: Payer: Medicare Other | Admitting: Dermatology

## 2020-02-07 DIAGNOSIS — D1801 Hemangioma of skin and subcutaneous tissue: Secondary | ICD-10-CM | POA: Diagnosis not present

## 2020-02-07 DIAGNOSIS — I729 Aneurysm of unspecified site: Secondary | ICD-10-CM

## 2020-02-07 DIAGNOSIS — L409 Psoriasis, unspecified: Secondary | ICD-10-CM

## 2020-02-07 DIAGNOSIS — L570 Actinic keratosis: Secondary | ICD-10-CM | POA: Diagnosis not present

## 2020-02-07 DIAGNOSIS — Z1283 Encounter for screening for malignant neoplasm of skin: Secondary | ICD-10-CM

## 2020-02-07 MED ORDER — CLOBETASOL PROPIONATE 0.05 % EX OINT: 1.0000 "application " | TOPICAL_OINTMENT | Freq: Every day | CUTANEOUS | 3 refills | Status: DC

## 2020-02-07 NOTE — Patient Instructions (Addendum)
Several skin issues today for Grant [name].  Starting from the feet: He has 3 dozen 2 mm white rough spots called acro keratoses.  These require no treatment.  The pink scaly coin shaped patches on the right more than left shin could represent either localized psoriasis or nummular eczema; prescription given for augmented betamethasone diprionate.Marland Kitchen  He will apply this to the scaly spots on his legs daily after bathing for 4 to 6weeks.  If the spots are improved, he can cancel his follow-up visit.  On the torso there are no atypical moles but a half dozen tan stuck on keratoses mainly on the back.  These require no treatment.  On the right hand is a pink scale plus three on the left hand that represents a solar keratosis (precancer).  2 thicker crusts on the right side of the back.  These were each treated with liquid nitrogen freeze.  On his neck and ear mostly on the right side or a half dozen soft blue 3 mm blebs which are venous lakes.  These do not need removal.  On the chest are 2 to 70mm inflamed open spots which are likely folliculitis; I did discuss with Grant [name] that if these persist for several months, biopsy should be obtained to rule out a nonmole skin cancer.

## 2020-02-08 ENCOUNTER — Encounter: Payer: Self-pay | Admitting: Cardiothoracic Surgery

## 2020-02-08 ENCOUNTER — Ambulatory Visit
Admission: RE | Admit: 2020-02-08 | Discharge: 2020-02-08 | Disposition: A | Discharge status: Home / Self Care | Payer: Medicare Other | Source: Ambulatory Visit | Attending: Cardiothoracic Surgery | Admitting: Cardiothoracic Surgery

## 2020-02-08 ENCOUNTER — Ambulatory Visit: Payer: Medicare Other | Admitting: Cardiothoracic Surgery

## 2020-02-08 VITALS — BP 137/83 | HR 68 | Temp 97.7°F | Resp 16 | Ht 70.0 in | Wt 286.0 lb

## 2020-02-08 DIAGNOSIS — G8929 Other chronic pain: Secondary | ICD-10-CM | POA: Diagnosis not present

## 2020-02-08 DIAGNOSIS — Z952 Presence of prosthetic heart valve: Secondary | ICD-10-CM

## 2020-02-08 DIAGNOSIS — Q2546 Tortuous aortic arch: Secondary | ICD-10-CM | POA: Diagnosis not present

## 2020-02-08 DIAGNOSIS — M5136 Other intervertebral disc degeneration, lumbar region: Secondary | ICD-10-CM | POA: Diagnosis not present

## 2020-02-08 DIAGNOSIS — M25511 Pain in right shoulder: Secondary | ICD-10-CM | POA: Diagnosis not present

## 2020-02-08 DIAGNOSIS — M9905 Segmental and somatic dysfunction of pelvic region: Secondary | ICD-10-CM | POA: Diagnosis not present

## 2020-02-08 DIAGNOSIS — M9901 Segmental and somatic dysfunction of cervical region: Secondary | ICD-10-CM | POA: Diagnosis not present

## 2020-02-08 DIAGNOSIS — M9904 Segmental and somatic dysfunction of sacral region: Secondary | ICD-10-CM | POA: Diagnosis not present

## 2020-02-08 DIAGNOSIS — Z954 Presence of other heart-valve replacement: Secondary | ICD-10-CM

## 2020-02-08 DIAGNOSIS — I729 Aneurysm of unspecified site: Secondary | ICD-10-CM

## 2020-02-08 DIAGNOSIS — M25512 Pain in left shoulder: Secondary | ICD-10-CM

## 2020-02-08 MED ORDER — AMOXICILLIN 500 MG PO CAPS: 2000.0000 mg | ORAL_CAPSULE | ORAL | 5 refills | Status: DC

## 2020-02-08 NOTE — Progress Notes (Signed)
PCP is Shirline Frees, MD Referring Provider is Josue Hector, MD  Chief Complaint  Patient presents with  . Routine Post Op    f/u with CXR...reassess upper back and shoulder discomfort after Bental l9/2/18      HPI: Patient returns for routine follow-up 3 years after Bentall procedure.  He has been having some incisional and shoulder discomfort.  CTA performed 6 months ago showed an intact aortic root repair without pseudoaneurysm.         Sternal wires are intact but have been pulled a few millimeters apart in the manubrium and upper sternum.  His main complaint of movement is in the lower sternum and is minor.  I told the patient I would not recommend sternal rewiring because of his comorbidities and risk of infection.  Today he feels fine.  Minimal pain in his sternum.  Seeing a chiropractor for shoulder pain.  Ask for prescription for amoxicillin as dental prophylaxis to be refilled. No symptoms of angina CHF ankle edema orthopnea. Anxious to start doing some light working out we discussed the exact limits in that area.   Past Medical History:  Diagnosis Date  . Allergic rhinitis   . Anxiety   . Arthritis   . Asthma   . Bladder tumor   . Chronic fatigue   . Depression    06/30/17 Pt denies being depressed, reports Effexor is taken for Chronic Fatigue   . Dyspnea   . Enlarged prostate   . Fibromyalgia   . GERD (gastroesophageal reflux disease)   . Headache   . History of chronic bronchitis   . History of migraine   . History of toxic encephalopathy   . Hypothyroidism   . OSA on CPAP    CPAP 14  . PAF (paroxysmal atrial fibrillation) ([place]) CARDIOLOGIST -- DR [name]   DX OCT 2013  . 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 Unspecified essential hypertension   . Urethral tumor    PROSTATIC    Past Surgical History:  Procedure Laterality Date  . BENTALL PROCEDURE N/A 07/02/2017   Procedure: BENTALL PROCEDURE;  Surgeon: Ivin Poot, MD;  Location: [facility];  Service: Open Heart  Surgery;  Laterality: N/A;  WITH CIRC ARREST  . CARDIAC SURGERY    . COLONOSCOPY    . CYSTOSCOPY W/ RETROGRADES Bilateral 06/15/2013   Procedure: CYSTOSCOPY WITH BILATERAL RETROGRADE PYELOGRAM  BLADDER BIOPSY, PROSTATIC URETHRAL BIOPSY, ;  Surgeon: Molli Hazard, MD;  Location: [facility];  Service: Urology;  Laterality: Bilateral;  . ESOPHAGOGASTRODUODENOSCOPY (EGD) WITH PROPOFOL N/A 03/11/2018   Procedure: ESOPHAGOGASTRODUODENOSCOPY (EGD) WITH PROPOFOL;  Surgeon: Clarene Essex, MD;  Location: [facility];  Service: Endoscopy;  Laterality: N/A;  . LAPAROSCOPIC CHOLECYSTECTOMY  01-14-2001  . RIGHT/LEFT HEART CATH AND CORONARY ANGIOGRAPHY N/A 06/12/2017   Procedure: RIGHT/LEFT HEART CATH AND CORONARY ANGIOGRAPHY;  Surgeon: Larey Dresser, MD;  Location: [facility];  Service: Cardiovascular;  Laterality: N/A;  . TEE WITHOUT CARDIOVERSION N/A 07/02/2017   Procedure: TRANSESOPHAGEAL ECHOCARDIOGRAM (TEE);  Surgeon: Prescott Gum, Collier Salina, MD;  Location: [facility];  Service: Open Heart Surgery;  Laterality: N/A;  . UMBILICAL HERNIA REPAIR  01-14-2008    Family History  Problem Relation Age of Onset  . Aortic aneurysm Mother 35       cause of death  . Other Father        motor vehicle accident  . Heart disease Other        family history  Social History Social History   Tobacco Use  . Smoking status: Former Smoker    Packs/day: 0.50    Years: 27.00    Pack years: 13.50    Types: Cigarettes    Quit date: 10/21/1979    Years since quitting: 40.3  . Smokeless tobacco: Never Used  Substance Use Topics  . Alcohol use: Yes    Alcohol/week: 4.0 standard drinks    Types: 4 Cans of beer per week    Comment: drinks "as much as I could" - 2-3 beers up to 8-10  . Drug use: Never    Current Outpatient Medications  Medication Sig Dispense Refill  . acetaminophen (TYLENOL) 500 MG tablet Take 1,000 mg by mouth every 8 (eight) hours as needed for mild pain, fever or  headache. Per bottle as needed    . albuterol (VENTOLIN HFA) 108 (90 Base) MCG/ACT inhaler INHALE 2 PUFFS INTO THE LUNGS EVERY 6 HOURS AS NEEDED FOR WHEEZING OR SHORTNESS OF BREATH 18 g 5  . amoxicillin (AMOXIL) 500 MG capsule Take 2,000 mg by mouth See admin instructions. 1 hour prior to dental procedure    . azelastine (ASTELIN) 137 MCG/SPRAY nasal spray Place 1-2 sprays into the nose 2 (two) times daily as needed for rhinitis or allergies.     . benzonatate (TESSALON) 200 MG capsule Take 1 capsule by mouth every 8 hours as needed for cough 40 capsule 1  . cetirizine (ZYRTEC) 10 MG tablet Take 10 mg by mouth daily.    . clobetasol ointment (TEMOVATE) [id number] % Apply 1 application topically daily. 60 g 3  . COLCRYS 0.6 MG tablet Take 0.6 mg by mouth daily.     Marland Kitchen Dextromethorphan-guaiFENesin (MUCINEX DM MAXIMUM STRENGTH) 60-1200 MG TB12 Take 1 tablet by mouth daily.     Marland Kitchen DHEA 50 MG TABS Take 50 mg by mouth daily with breakfast.     . EPINEPHrine 0.3 mg/0.3 mL IJ SOAJ injection Inject 0.3 mLs (0.3 mg total) into the muscle as needed (for angioedema). 2 Device 0  . Erenumab-aooe (AIMOVIG) 140 MG/ML SOAJ Inject 140 mg into the skin every 30 (thirty) days. 1.12 mg 11  . finasteride (PROSCAR) 5 MG tablet Take 5 mg by mouth at bedtime.     Marland Kitchen levothyroxine (SYNTHROID, LEVOTHROID) 112 MCG tablet Take 112 mcg by mouth daily before breakfast.     . liothyronine (CYTOMEL) 25 MCG tablet Take 25 mcg by mouth daily.     Marland Kitchen losartan (COZAAR) 50 MG tablet TAKE 1 TABLET(50 MG) BY MOUTH DAILY 30 tablet 6  . Methylsulfonylmethane (MSM) POWD Take 4 g by mouth daily.    . metoprolol tartrate (LOPRESSOR) 50 MG tablet Take 1 tablet (50 mg total) by mouth 2 (two) times daily. 180 tablet 3  . omeprazole (PRILOSEC) 40 MG capsule Take 1 capsule (40 mg total) by mouth 2 (two) times daily. 180 capsule 1  . rivaroxaban (XARELTO) 20 MG TABS tablet Take 1 tablet (20 mg total) by mouth daily with supper. 30 tablet 5  . rizatriptan  (MAXALT) 10 MG tablet Take 10 mg by mouth as needed for migraine. May repeat in 2 hours if needed    . SYMBICORT 160-4.5 MCG/ACT inhaler INHALE 2 PUFFS BY MOUTH TWICE DAILY 10.2 g 5  . tamsulosin (FLOMAX) 0.4 MG CAPS capsule Take 0.4 mg by mouth daily after supper.    . testosterone cypionate (DEPOTESTOTERONE CYPIONATE) 100 MG/ML injection Inject 200 mg into the muscle every Thursday. For IM use  only     . venlafaxine (EFFEXOR-XR) 150 MG 24 hr capsule Take 150 mg by mouth daily with breakfast.      No current facility-administered medications for this visit.    Allergies  Allergen Reactions  . Perphenazine Other (See Comments)    Tongue swelling  . Amiodarone Swelling  . Codeine Itching  . Tramadol Other (See Comments)    dellusion     Review of Systems   No complaints  BP 137/83 (BP Location: Left Arm, Patient Position: Sitting, Cuff Size: Large)   Pulse 68   Temp 97.7 F (36.5 C)   Resp 16   Ht 5\' 10"  (1.778 m)   Wt 286 lb (129.7 kg)   SpO2 95% Comment: RA  BMI 41.04 kg/m  Physical Exam      Exam    General- alert and comfortable    Neck- no JVD, no cervical adenopathy palpable, no carotid bruit   Lungs- clear without rales, wheezes.  Sternal incision appears stable I feel no movement.   Cor- regular rate and rhythm, no murmur , gallop   Abdomen- soft, non-tender   Extremities - warm, non-tender, minimal edema   Neuro- oriented, appropriate, no focal weakness   Diagnostic Tests: Chest x-ray images performed today personally viewed showing no pleural effusion no edema.  Impression: Continues to do well 3 years after Bentall procedure with a biologic aortic valve. He understands the importance of amoxicillin dental prophylaxis. He understands the limits on lifting he should follow.  Plan: Return in 6 months with follow-up.  Len Childs, MD [facility] [phone]

## 2020-02-11 ENCOUNTER — Encounter: Payer: Self-pay | Admitting: Dermatology

## 2020-02-11 NOTE — Progress Notes (Addendum)
   New Patient   Subjective  Grant [name] is a 70 y.o. male who presents for the following: Annual Exam (Here for an overdue skin check.  Last one was in 2018. Couple of places have come up on bilateral lower extremities with itching and flaking.  Look over his back.).  Rash Location: Legs Duration: Months Quality: More spots Associated Signs/Symptoms: Scaling H Modifying Factors:  Severity:  Timing: Context: Would like general skin check   The following portions of the chart were reviewed this encounter and updated as appropriate: Tobacco  Allergies  Meds  Problems  Med Hx  Surg Hx  Fam Hx      Objective  Well appearing patient in no apparent distress; mood and affect are within normal limits.  A full examination was performed including scalp, head, eyes, ears, nose, lips, neck, chest, axillae, abdomen, back, buttocks, bilateral upper extremities, bilateral lower extremities, hands, feet, fingers, toes, fingernails, and toenails. All findings within normal limits unless otherwise noted below. Several skin issues today for Grant [name].  Starting from the feet: He has 3 dozen 2 mm white rough spots called acro keratoses.  These require no treatment.  The pink scaly coin shaped patches on the right more than left shin could represent either localized psoriasis or nummular eczema; prescription given for augmented betamethasone diprionate.Marland Kitchen  He will apply this to the scaly spots on his legs daily after bathing for 4 to 6weeks.  If the spots are improved, he can cancel his follow-up visit.  On the torso there are no atypical moles but a half dozen tan stuck on keratoses mainly on the back.  These require no treatment.  On the right hand is a pink scale plus three on the left hand that represents a solar keratosis (precancer).  2 thicker crusts on the right side of the back.  These were each treated with liquid nitrogen freeze.  On his neck and ear mostly on the right side or a half  dozen soft blue 3 mm blebs which are venous lakes.  These do not need removal.  On the chest are 2 to 54mm inflamed open spots which are likely folliculitis; I did discuss with Mr. and Grant [name] that if these persist for several months, biopsy should be obtained to rule out a nonmole skin cancer.  Assessment & Plan  AK (actinic keratosis) (4) Right Hand - Posterior; Left Dorsal Hand (3)  Destruction of lesion - Left Dorsal Hand, Right Hand - Posterior Complexity: simple   Destruction method: cryotherapy   Informed consent: discussed and consent obtained   Lesion destroyed using liquid nitrogen: Yes   Cryotherapy cycles:  5 Outcome: patient tolerated procedure well with no complications   Post-procedure details: wound care instructions given    Psoriasis (2) Left Lower Leg - Anterior; Right Lower Leg - Anterior  Ordered Medications: clobetasol ointment (TEMOVATE) 0.05 %

## 2020-02-23 DIAGNOSIS — N419 Inflammatory disease of prostate, unspecified: Secondary | ICD-10-CM | POA: Diagnosis not present

## 2020-02-28 ENCOUNTER — Other Ambulatory Visit: Payer: Self-pay | Admitting: Internal Medicine

## 2020-02-28 MED ORDER — SYMBICORT 160-4.5 MCG/ACT IN AERO: 2.0000 | INHALATION_SPRAY | Freq: Two times a day (BID) | RESPIRATORY_TRACT | 0 refills | Status: DC

## 2020-03-02 DIAGNOSIS — G4733 Obstructive sleep apnea (adult) (pediatric): Secondary | ICD-10-CM | POA: Diagnosis not present

## 2020-03-03 ENCOUNTER — Other Ambulatory Visit: Payer: Self-pay | Admitting: Cardiovascular Disease

## 2020-03-03 ENCOUNTER — Other Ambulatory Visit: Payer: Self-pay | Admitting: Cardiothoracic Surgery

## 2020-03-05 NOTE — Telephone Encounter (Signed)
Prescription refill request for Xarelto received.   Last office visit: Grant [name], 08/23/2019 Weight: 129.7 kg Age: 69 yo Scr: 0.96, 12/01/2019 CrCl: 138 ml/min   Prescription refill sent.

## 2020-03-15 DIAGNOSIS — Z0120 Encounter for dental examination and cleaning without abnormal findings: Secondary | ICD-10-CM | POA: Diagnosis not present

## 2020-03-21 ENCOUNTER — Ambulatory Visit: Payer: Medicare Other | Admitting: Cardiovascular Disease

## 2020-03-22 NOTE — Progress Notes (Signed)
Virtual Visit via Video Note   This visit type was conducted due to national recommendations for restrictions regarding the COVID-19 Pandemic (e.g. social distancing) in an effort to limit this patient's exposure and mitigate transmission in our community.  Due to her co-morbid illnesses, this patient is at least at moderate risk for complications without adequate follow up.  This format is felt to be most appropriate for this patient at this time.  All issues noted in this document were discussed and addressed.  A limited physical exam was performed with this format.  Please refer to the patient's chart for her consent to telehealth for [facility].   Date:  03/22/2020   ID:  Grant [name], DOB September 15, 1951, MRN [id number]  PCP:  Shirline Frees, MD  Cardiologist: Dr. [name]  EP: Dr. [name]  Patient Location: Home Physician Location: Office  Chief Complaint: PAF/AVR   History of Present Illness:   Grant [name] is a 69 y.o. male with paroxysmal atrial fibrillation, hypertension, OSA on CPAPand bicuspid aortic valve s/p Bentall and bioprosthetic (34mm Edwards pericardial Magna Ease)  No CAD at cath done 06/12/17 prior to surgery   H/o bicuspid aortic valve w/ aortic insuffiencey and dilated aortic root, normal coronaries by cath and normal LVEF s/p  Bentall Procedure by Dr. Prescott Gum 07/02/17 with 28 mm graft and 25 mm edwards pericardial magna ease valve  Post operative course was complicated by atrial fibrillation w/ RVR, treated with amiodarone and coumadin. He also had post-operative delirium. Not clear if this was from tramadol or amiodarone but both stopped   Had recurrent PAF 07/28/17 converted with iv cardizem and seen by EP Dr [name] who recommended amiodarone 200 mg daily and changed back to  xarelto for anticoagulation   January and April had what appears to be angioedema. In January noted tongue swelling seen [facility] and given steroids and benadryl  Was not On ACE or  ARB He does not tolerate oral steroids and given zyrtec pepcid and steroid injection Told to discuss with primary stopping Tilafon (antipsychoitic)  and Proscar which have been implicated in angioedema Had recurrence off amiodarone so clearly not this   Echo 08/05/18 EF 60-65%  AVR mean gradient 16 peak 33 mmHg no AR  02/17/19 admitted with with confusion Seen by neurology with negative EEG And no acute abnormalities on MRI ? Global amnesia episode   Staying at [name]. 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 at times but no other cardiac issues   Past Medical History:  Diagnosis Date   Allergic rhinitis    Anxiety    Arthritis    Asthma    Bladder tumor    Chronic fatigue    Depression    06/30/17 Pt denies being depressed, reports Effexor is taken for Chronic Fatigue    Dyspnea    Enlarged prostate    Fibromyalgia    GERD (gastroesophageal reflux disease)    Headache    History of chronic bronchitis    History of migraine    History of toxic encephalopathy    Hypothyroidism    OSA on CPAP    CPAP 14   PAF (paroxysmal atrial fibrillation) ([place]) CARDIOLOGIST -- DR [name]   DX OCT 2013   Sensitiveness to light    Unspecified essential hypertension  Urethral tumor    PROSTATIC    Past Surgical History:  Procedure Laterality Date   BENTALL PROCEDURE N/A 07/02/2017   Procedure: BENTALL PROCEDURE;  Surgeon: Ivin Poot, MD;  Location: [place];  Service: Open Heart Surgery;  Laterality: N/A;  WITH CIRC ARREST   CARDIAC SURGERY     COLONOSCOPY     CYSTOSCOPY W/ RETROGRADES Bilateral 06/15/2013   Procedure: CYSTOSCOPY WITH BILATERAL RETROGRADE PYELOGRAM  BLADDER BIOPSY, PROSTATIC URETHRAL BIOPSY, ;  Surgeon: Molli Hazard, MD;  Location: [facility];   Service: Urology;  Laterality: Bilateral;   ESOPHAGOGASTRODUODENOSCOPY (EGD) WITH PROPOFOL N/A 03/11/2018   Procedure: ESOPHAGOGASTRODUODENOSCOPY (EGD) WITH PROPOFOL;  Surgeon: Clarene Essex, MD;  Location: [place];  Service: Endoscopy;  Laterality: N/A;   LAPAROSCOPIC CHOLECYSTECTOMY  01-14-2001   RIGHT/LEFT HEART CATH AND CORONARY ANGIOGRAPHY N/A 06/12/2017   Procedure: RIGHT/LEFT HEART CATH AND CORONARY ANGIOGRAPHY;  Surgeon: Larey Dresser, MD;  Location: [place] [facility];  Service: Cardiovascular;  Laterality: N/A;   TEE WITHOUT CARDIOVERSION N/A 07/02/2017   Procedure: TRANSESOPHAGEAL ECHOCARDIOGRAM (TEE);  Surgeon: Prescott Gum, Collier Salina, MD;  Location: [place];  Service: Open Heart Surgery;  Laterality: N/A;   UMBILICAL HERNIA REPAIR  01-14-2008    Current Medications: Prior to Admission medications   Medication Sig Start Date End Date Taking? Authorizing Provider  acetaminophen (TYLENOL) 500 MG tablet Take 500 mg by mouth every 6 (six) hours as needed for moderate pain.    [provider]  albuterol (VENTOLIN HFA) 108 (90 Base) MCG/ACT inhaler INHALE 2 PUFFS INTO LUNGS EVERY 6 HOURS AS NEEDED FOR WHEEZING OR SHORTNESS OF BREATH 01/19/17   Baird Lyons D, MD  amiodarone (PACERONE) 200 MG tablet Take 1 tablet (200 mg total) by mouth daily. 07/29/17   Leanor Kail, PA  aspirin 81 MG EC tablet Take 1 tablet (81 mg total) by mouth daily. 07/13/17   Gold, Wilder Glade, PA-C  azelastine (ASTELIN) 137 MCG/SPRAY nasal spray Place 1-2 sprays into the nose 2 (two) times daily as needed for rhinitis or allergies.     [provider]  benzonatate (TESSALON) 200 MG capsule Take 1 capsule (200 mg total) by mouth every 6 (six) hours as needed for cough. 11/19/16   Baird Lyons D, MD  budesonide-formoterol (SYMBICORT) 160-4.5 MCG/ACT inhaler INHALE 2 PUFF INTO THE LUNGS 2 TIMES A DAY. 01/19/17   Baird Lyons D, MD  Cholecalciferol (VITAMIN D-3) 5000 UNITS TABS Take 5,000 Units by  mouth daily with breakfast.     [provider]  COLCRYS 0.6 MG tablet Take 0.6 mg by mouth daily. With breakfast 01/29/12   [provider]  Cyanocobalamin (VITAMIN B12 SL) Place 1,200 mcg under the tongue daily. 1 dropper    [provider]  dextromethorphan (DELSYM) 30 MG/5ML liquid Take 30 mg by mouth at bedtime as needed for cough.     [provider]  DHEA 50 MG TABS Take 50 mg by mouth daily with breakfast.     [provider]  fexofenadine (ALLEGRA) 180 MG tablet Take 180 mg by mouth daily with breakfast.     [provider]  finasteride (PROSCAR) 5 MG tablet Take 5 mg by mouth at bedtime.  01/14/14   [provider]  Hypromellose Vertell Limber ALLERGY NASAL SPRAY NA) Place 1 spray into both nostrils daily as needed (for allergies).     [provider]  levothyroxine (SYNTHROID, LEVOTHROID) 112 MCG tablet Take 112 mcg by mouth daily  before breakfast.     [provider]  liothyronine (CYTOMEL) 25 MCG tablet Take 25 mcg by mouth every morning.     [provider]  magic mouthwash w/lidocaine SOLN Take 10 mLs by mouth 4 (four) times daily as needed for mouth pain. 07/16/17   Deneise Lever, MD  Menthol, Topical Analgesic, (BLUE-EMU MAXIMUM STRENGTH EX) Apply 1 application topically 4 (four) times daily as needed (for arthritis pain.).    [provider]  metoprolol tartrate (LOPRESSOR) 50 MG tablet Take 1 tablet (50 mg total) by mouth 2 (two) times daily. 07/29/17   Bhagat, Bhavinkumar, PA  milk thistle 175 MG tablet Take 175 mg by mouth daily.    [provider]  Multiple Vitamins-Minerals (ONE-A-DAY MENS HEALTH FORMULA PO) Take 1 tablet by mouth daily with breakfast.     [provider]  omeprazole (PRILOSEC) 40 MG capsule Take 1 capsule (40 mg total) by mouth 2 (two) times daily. 10/31/16   Josue Hector, MD  perphenazine (TRILAFON) 2 MG tablet Take 2 mg by mouth 2 (two) times daily  as needed (for migraines).     [provider]  Probiotic Product (ALIGN) 4 MG CAPS Take 4 mg by mouth daily with breakfast.     [provider]  tamsulosin (FLOMAX) 0.4 MG CAPS capsule Take 0.4 mg by mouth daily after supper.    [provider]  testosterone cypionate (DEPOTESTOTERONE CYPIONATE) 100 MG/ML injection Inject 200 mg into the muscle every Thursday. For IM use only     [provider]  tetrahydrozoline-zinc (VISINE-AC) 0.05-0.25 % ophthalmic solution Place 1 drop into both eyes as needed (dry eyes).    [provider]  venlafaxine (EFFEXOR-XR) 150 MG 24 hr capsule Take 150 mg by mouth every morning.     [provider]  warfarin (COUMADIN) 2.5 MG tablet Take 1 tablet (2.5 mg total) by mouth daily at 6 PM. Take 7.5mg  (3 tablet tonight) and 5mg  tomorrow and INR check Friday Patient taking differently: Take 2.5 mg by mouth daily at 6 PM. Take 7.5mg  (3 tablet tonight) and 5mg  tomorrow and INR check Friday and as directed 07/29/17   Leanor Kail, PA    Allergies:   Perphenazine, Amiodarone, Codeine, and Tramadol   Social History   Socioeconomic History   Marital status: Married    Spouse name: Not on file   Number of children: Not on file   Years of education: Not on file   Highest education level: Not on file  Occupational History   Occupation: disabled    Comment: owner of buisness  Tobacco Use   Smoking status: Former Smoker    Packs/day: 0.50    Years: 27.00    Pack years: 13.50    Types: Cigarettes    Quit date: 10/21/1979    Years since quitting: 40.4   Smokeless tobacco: Never Used  Substance and Sexual Activity   Alcohol use: Yes    Alcohol/week: 4.0 standard drinks    Types: 4 Cans of beer per week    Comment: drinks "as much as I could" - 2-3 beers up to 8-10   Drug use: Never   Sexual activity: Not on file  Other Topics Concern   Not on file  Social History Narrative   Not on file    Social Determinants of Health   Financial Resource Strain:    Difficulty of Paying Living Expenses:   Food Insecurity:    Worried About Running  Out of Food in the Last Year:    Ran Out of Food in the Last Year:   Transportation Needs:    Film/video editor (Medical):    Lack of Transportation (Non-Medical):   Physical Activity:    Days of Exercise per Week:    Minutes of Exercise per Session:   Stress:    Feeling of Stress :   Social Connections:    Frequency of Communication with Friends and Family:    Frequency of Social Gatherings with Friends and Family:    Attends Religious Services:    Active Member of Clubs or Organizations:    Attends Music therapist:    Marital Status:      Family History:  The patient's family history includes Aortic aneurysm (age of onset: 68) in his mother; Heart disease in an other family member; Other in his father.   ROS:   Please see the history of present illness.    ROS All other systems reviewed and are negative.   PHYSICAL EXAM:   VS:  There were no vitals taken for this visit.    Obese white male No distress JVP not visible  No tachypnea   Wt Readings from Last 3 Encounters:  02/08/20 286 lb (129.7 kg)  11/14/19 270 lb (122.5 kg)  10/26/19 265 lb (120.2 kg)      Studies/Labs Reviewed:   EKG:   02/16/19 SR LAD nonspecific ST changes   Recent Labs: 10/25/2019: Creat 0.97   Lipid Panel    Component Value Date/Time   CHOL 185 02/17/2019 0714   CHOL 184 05/05/2017 0931   TRIG 234 (H) 02/17/2019 0714   HDL 42 02/17/2019 0714   HDL 45 05/05/2017 0931   CHOLHDL 4.4 02/17/2019 0714   VLDL 47 (H) 02/17/2019 0714   LDLCALC 96 02/17/2019 0714   LDLCALC 94 05/05/2017 0931    Additional studies/ records that were reviewed today include:  As above   ASSESSMENT & PLAN:    1. PAF - on xarelto amiodarone stopped   2. HTN - Stable on current regimen   3. H/o Bicuspid Aortic Valve w/  Aortic Insuffiencey and Dilated Aortic Root - s/p Bentall Procedure with tissue valve by  Dr. Prescott Gum 07/02/17. Post op echo with stable gradients no AR  -Amoxacillin called in for SBE - f/u echo 07/2021 will be 3 years   4. OSA -Compliant with CPAP.  5. Angioedema:  Recurrent January and April of this year Not on ACE/ARB Has epi pens  f/u with allergist cannot take oral prednisone due to MS changes   6. Global Amnesia:  F/u neurology MRI/EEG ok     [name]

## 2020-03-28 ENCOUNTER — Telehealth (INDEPENDENT_AMBULATORY_CARE_PROVIDER_SITE_OTHER): Payer: Medicare Other | Admitting: Cardiovascular Disease

## 2020-03-28 ENCOUNTER — Other Ambulatory Visit: Payer: Self-pay

## 2020-03-28 VITALS — BP 149/95

## 2020-03-28 DIAGNOSIS — Z952 Presence of prosthetic heart valve: Secondary | ICD-10-CM | POA: Diagnosis not present

## 2020-03-28 MED ORDER — METOPROLOL TARTRATE 50 MG PO TABS: 50.0000 mg | ORAL_TABLET | Freq: Two times a day (BID) | ORAL | 3 refills | Status: DC

## 2020-03-28 MED ORDER — RIVAROXABAN 20 MG PO TABS: 20.0000 mg | ORAL_TABLET | Freq: Every day | ORAL | 3 refills | Status: DC

## 2020-03-28 NOTE — Patient Instructions (Addendum)
Medication Instructions:  *If you need a refill on your cardiac medications before your next appointment, please call your pharmacy*  Lab Work: If you have labs (blood work) drawn today and your tests are completely normal, you will receive your results only by: . MyChart Message (if you have MyChart) OR . A paper copy in the mail If you have any lab test that is abnormal or we need to change your treatment, we will call you to review the results.  Testing/Procedures: None ordered today.   Follow-Up: At [facility], you and your health needs are our priority.  As part of our continuing mission to provide you with exceptional heart care, we have created designated Provider Care Teams.  These Care Teams include your primary Cardiologist (physician) and Advanced Practice Providers (APPs -  Physician Assistants and Nurse Practitioners) who all work together to provide you with the care you need, when you need it.  We recommend signing up for the patient portal called "MyChart".  Sign up information is provided on this After Visit Summary.  MyChart is used to connect with patients for Virtual Visits (Telemedicine).  Patients are able to view lab/test results, encounter notes, upcoming appointments, etc.  Non-urgent messages can be sent to your provider as well.   To learn more about what you can do with MyChart, go to [email or web address].    Your next appointment:   12 month(s)  The format for your next appointment:   In Person  Provider:   You may see Dr. Nishan or one of the following Advanced Practice Providers on your designated Care Team:    Lori Gerhardt, NP  Laura Ingold, NP  Jill McDaniel, NP     

## 2020-04-03 ENCOUNTER — Other Ambulatory Visit: Payer: Self-pay

## 2020-04-03 ENCOUNTER — Ambulatory Visit: Payer: Medicare Other | Admitting: Dermatology

## 2020-04-03 ENCOUNTER — Encounter: Payer: Self-pay | Admitting: Dermatology

## 2020-04-03 DIAGNOSIS — L821 Other seborrheic keratosis: Secondary | ICD-10-CM | POA: Diagnosis not present

## 2020-04-03 DIAGNOSIS — L570 Actinic keratosis: Secondary | ICD-10-CM

## 2020-04-03 DIAGNOSIS — D1801 Hemangioma of skin and subcutaneous tissue: Secondary | ICD-10-CM

## 2020-04-03 DIAGNOSIS — L729 Follicular cyst of the skin and subcutaneous tissue, unspecified: Secondary | ICD-10-CM | POA: Diagnosis not present

## 2020-04-03 MED ORDER — MUPIROCIN 2 % EX OINT: TOPICAL_OINTMENT | CUTANEOUS | 1 refills | Status: DC

## 2020-04-03 NOTE — Progress Notes (Signed)
   Follow-Up Visit   Subjective  Grant [name] is a 69 y.o. male who presents for the following: Follow-up (no new concerns).  Dark bump Location: Left chest Duration: Years Quality: Stable Associated Signs/Symptoms: Modifying Factors:  Severity:  Timing: Context:   The following portions of the chart were reviewed this encounter and updated as appropriate: Tobacco  Allergies  Meds  Problems  Med Hx  Surg Hx  Fam Hx      Objective  Well appearing patient in no apparent distress; mood and affect are within normal limits.  All skin waist up examined. Plus legs.   Assessment & Plan  AK (actinic keratosis) (2) Mid Parietal Scalp; Right Buccal Cheek   Topical mupirocin, eliminate picking.  Hemangioma of skin Left Breast  Leave if stable  Seborrheic keratosis Left Upper Back  Leave if stable Routine follow-up for [facility].  The multiple keratoses on his lower legs are currently not inflamed and Grant [name] and his wife (present throughout visit) were reminded that there is no true cure for these so no intervention planned.  The precancers on the arm are relatively small and no freezing needed.  I pointed out to Mrs. Rhyne that the dark purple spot on the upper central left chest is just a benign vascular growth (several people when Grant [name] was in the water pointed it out and told him he needed a skin doctor).  Two rough tan spots on the back are benign keratoses.  The cyst under the skin in the central upper back is not inflamed and safe to leave.  He does have some impetiginized areas to the right of his nostril and a couple of open sores on the crown of the scalp related to picking.  This is common and not an easy habit to stop, but I did recommend they pick up a tube of prescription mupirocin ointment which will both minimize any secondary bacteria and can be a gentle form of behavior modification to apply instead of picking.  There are no known side effects of this.   He may apply the cream 0 to multiple times daily.  Routine general skin check annually.

## 2020-04-09 ENCOUNTER — Encounter: Payer: Self-pay | Admitting: Neurology

## 2020-04-09 DIAGNOSIS — H5212 Myopia, left eye: Secondary | ICD-10-CM | POA: Diagnosis not present

## 2020-04-09 NOTE — Progress Notes (Addendum)
Reeves Kesinger ([facility]illard Anes) Rx #: [id number] Aimovig 140MG /ML auto-injectors   Form [employer] [place] [employer] Part D General Authorization Form Created 1 hour ago Sent to Plan 14 minutes ago Plan Response 14 minutes ago Submit Clinical Questions 11 minutes ago Determination Favorable 3 minutes ago Message from Plan Effective from 04/09/2020 through 04/09/2021.

## 2020-04-13 ENCOUNTER — Encounter: Payer: Self-pay | Admitting: Neurology

## 2020-04-13 ENCOUNTER — Other Ambulatory Visit: Payer: Self-pay

## 2020-04-13 ENCOUNTER — Ambulatory Visit: Payer: Medicare Other | Admitting: Neurology

## 2020-04-13 VITALS — BP 144/84 | HR 62 | Resp 20 | Ht 70.0 in | Wt 281.0 lb

## 2020-04-13 DIAGNOSIS — I671 Cerebral aneurysm, nonruptured: Secondary | ICD-10-CM | POA: Diagnosis not present

## 2020-04-13 DIAGNOSIS — R310 Gross hematuria: Secondary | ICD-10-CM | POA: Diagnosis not present

## 2020-04-13 DIAGNOSIS — G43109 Migraine with aura, not intractable, without status migrainosus: Secondary | ICD-10-CM | POA: Diagnosis not present

## 2020-04-13 DIAGNOSIS — R3911 Hesitancy of micturition: Secondary | ICD-10-CM | POA: Diagnosis not present

## 2020-04-13 DIAGNOSIS — G454 Transient global amnesia: Secondary | ICD-10-CM

## 2020-04-13 DIAGNOSIS — N401 Enlarged prostate with lower urinary tract symptoms: Secondary | ICD-10-CM | POA: Diagnosis not present

## 2020-04-13 MED ORDER — AIMOVIG 140 MG/ML ~~LOC~~ SOAJ: 1.0000 | SUBCUTANEOUS | 11 refills | Status: DC

## 2020-04-13 MED ORDER — RIZATRIPTAN BENZOATE 10 MG PO TABS: 10.0000 mg | ORAL_TABLET | ORAL | 11 refills | Status: DC | PRN

## 2020-04-13 NOTE — Progress Notes (Signed)
Aimovig Education done with pt while at visit, pt also gave his self 1st dose of Aimovig while in the room, pt did well, no question, pt tolerated well, pt wife with pt she watched with no questions, both advised to call office if they have any question,

## 2020-04-13 NOTE — Patient Instructions (Addendum)
Good to see you! Continue all your current medications. Let's hope Aimovig helps better, please keep a calendar of your migraines. Schedule MRA head without contrast when able. Follow-up in 6 months, call for any changes.

## 2020-04-13 NOTE — Progress Notes (Signed)
NEUROLOGY FOLLOW UP OFFICE NOTE  Grant [name] [id number] 1951-02-19  HISTORY OF PRESENT ILLNESS: I had the pleasure of seeing Grant [name] in follow-up in the neurology clinic on 04/13/2020. He is again accompanied by his wife who helps supplement the history today. The patient was last seen 4 months ago. He was initially seen after an episode of confusion/repeating himself in April 2020, TGA versus complicated migraine. He continued to report migraines, and had side effects on Topamax. On his last visit, they reported an episode where he was more tired, not participating in the conversation, then was confused as to the date. We discussed doing a 24-hour EE which he declined. No further confusional episodes. He has bad spells with chronic fatigue, he has fel pretty bad the past month, "starts feeling like I'm dead, lasts a while, then gets better." He has "near migraines" where he takes Maxalt, Aleve, Tylenol, and whiskey. Last migraine was a couple of days ago. He thinks he has a kidney infection with hematuria, dysuria. He had issues at the beach a month ago and took amoxicillin, he is seeing Urology. His wife denies any staring/unresponsive episodes. No falls. Sleep is pretty good with his CPAP, when he has headaches he tosses and turns more at night.    History on Initial Assessment 03/02/2019: This is a 69 year old right-handed man with a history of OSA, atrial fibrillation on chronic anticoagulation, aortic valve replacement, depression, chronic fatigue syndrome/fibromyalgia, presenting after hospital admission last 02/16/2019 for altered mental status. He was in his usual state of health ("a little unusual all the time anyway"), working in his shop when he stopped doing one thing and could not remember what he was going to do next. As this was going on, his wife came to the shop and he does not remember much until they were riding in the car to the hospital. His wife reports that he has had  baseline confusion with his chronic fatigue syndrome for more than 15 years, worse when he is tired, but this day was unusual because he was looking all over the place not knowing what he was doing, and he kept asking her the same thing repeatedly. He would usually argue when he goes to the doctor, but this time did not argue, he was not as cognizant of how different/serious it was while he was in the hospital. His wife reported that he had been "bouncing off the edge of a migraine daily for 2-3 weeks prior." He was taking a lot of Tylenol, Aleve, and Maxalt. It appears he was back to baseline in the ER. Bloodwork was normal, UDS and EtOH level negative. I personally reviewed MRI/MRA brain which did not show any acute changes. There was mild chronic microvascular disease. There was scattered small vessel atherosclerotic change without significant stenosis. There was an incidental finding of a 32mm focal outpouching at the left MCA bifurcation, suspicious for aneurysm. His wake and drowsy EEG was normal. Symptoms felt due to transient global amnesia or migraine. No further recurrence of similar significant confusion.  They report a complicated history since his early 22s which has been diagnosed as chronic fatigue syndrome (CFS). He takes Effexor. His wife states it has been more than 15 years where he has been confused and kind of forgetful. 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 He would have days where he speaks and thinks  better, figuring out things faster, usually when he has an adrenaline burst, then would crash for a couple of days. If he gets overstimulated or surprised by a loud sound or too much information, he would "start going somewhere else" and get confused. He got lost in a store one time. It can last 1-2 hours or a whole day before he is back to his usual self. 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 He has had migraines since childhood, usually with stabbing pain in the left temporal or retroorbital regions. If he does not stop them, they may go on all day. He would have tunnel vision and see black shimmering objects, with nausea and light sensitivity, tunnel vision. His left eye would almost shut. He has been taking Maxalt every other day for the past few weeks. He has never been on a preventative medication. He took perphenazine in the past but it caused tongue swelling. He took it a week or so ago and his tongue swelled up again. They report balance issues for the past 1.5 years, he would have extreme fatigue where he feels like he would pass out, occasional vertical diplopia. He was in the hospital while visiting the beach in 08/2018. They fell since his cardiac surgery in 2018, "more funny things have been going on since then." He does not drive. His mother and maternal grandmother had migraines. He denies any focal numbness/tingling/weakness, dysphagia. Sleep is good with CPAP machine.    PAST MEDICAL HISTORY: Past Medical History:  Diagnosis Date  . Allergic rhinitis   . Anxiety   . Arthritis   . Asthma   . Bladder tumor   . Chronic fatigue   . Depression    06/30/17 Pt denies being depressed, reports Effexor is taken for Chronic Fatigue   . Dyspnea   . Enlarged prostate   . Fibromyalgia   . GERD (gastroesophageal reflux disease)   . Headache   . History of chronic bronchitis   . History of migraine   . History of toxic encephalopathy   . Hypothyroidism   . OSA on CPAP    CPAP 14  . PAF (paroxysmal atrial fibrillation) ([facility]) CARDIOLOGIST -- DR [name]   DX OCT 2013  . 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 Unspecified essential hypertension   . Urethral tumor    PROSTATIC    MEDICATIONS: Current Outpatient Medications on File Prior to Visit  Medication Sig Dispense Refill  . acetaminophen (TYLENOL) 500 MG tablet Take  1,000 mg by mouth every 8 (eight) hours as needed for mild pain, fever or headache. Per bottle as needed    . albuterol (VENTOLIN HFA) 108 (90 Base) MCG/ACT inhaler INHALE 2 PUFFS INTO THE LUNGS EVERY 6 HOURS AS NEEDED FOR WHEEZING OR SHORTNESS OF BREATH 18 g 5  . azelastine (ASTELIN) 137 MCG/SPRAY nasal spray Place 1-2 sprays into the nose 2 (two) times daily as needed for rhinitis or allergies.     . cetirizine (ZYRTEC) 10 MG tablet Take 10 mg by mouth daily.    . clobetasol ointment (TEMOVATE) 7.03 % Apply 1 application topically daily. 60 g 3  . COLCRYS 0.6 MG tablet Take 0.6 mg by mouth daily.     Marland Kitchen DHEA 50 MG TABS Take 50 mg by mouth daily with breakfast.     . Erenumab-aooe (AIMOVIG) 140 MG/ML SOAJ Inject 140 mg into the skin every 30 (thirty) days. 1.12 mg 11  .  finasteride (PROSCAR) 5 MG tablet Take 5 mg by mouth at bedtime.     Marland Kitchen levothyroxine (SYNTHROID, LEVOTHROID) 112 MCG tablet Take 112 mcg by mouth daily before breakfast.     . liothyronine (CYTOMEL) 25 MCG tablet Take 25 mcg by mouth daily.     Marland Kitchen losartan (COZAAR) 50 MG tablet TAKE 1 TABLET BY MOUTH EVERY DAY 30 tablet 6  . Methylsulfonylmethane (MSM) POWD Take 4 g by mouth daily.    . metoprolol tartrate (LOPRESSOR) 50 MG tablet Take 1 tablet (50 mg total) by mouth 2 (two) times daily. 180 tablet 3  . mupirocin ointment (BACTROBAN) 2 % Apply to open sores qd-bid as needed 22 g 1  . omeprazole (PRILOSEC) 40 MG capsule Take 1 capsule (40 mg total) by mouth 2 (two) times daily. 180 capsule 1  . rivaroxaban (XARELTO) 20 MG TABS tablet Take 1 tablet (20 mg total) by mouth daily with supper. 90 tablet 3  . rizatriptan (MAXALT) 10 MG tablet Take 10 mg by mouth as needed for migraine. May repeat in 2 hours if needed    . SYMBICORT 160-4.5 MCG/ACT inhaler Inhale 2 puffs into the lungs 2 (two) times daily. 10.2 g 0  . tamsulosin (FLOMAX) 0.4 MG CAPS capsule Take 0.4 mg by mouth daily after supper.    . testosterone cypionate  (DEPOTESTOTERONE CYPIONATE) 100 MG/ML injection Inject 200 mg into the muscle every Thursday. For IM use only     . venlafaxine (EFFEXOR-XR) 150 MG 24 hr capsule Take 150 mg by mouth daily with breakfast.     . amoxicillin (AMOXIL) 500 MG capsule Take 4 capsules (2,000 mg total) by mouth See admin instructions. 1 hour prior to dental procedure (Patient not taking: Reported on 04/13/2020) 8 capsule 5  . benzonatate (TESSALON) 200 MG capsule Take 1 capsule by mouth every 8 hours as needed for cough (Patient not taking: Reported on 04/13/2020) 40 capsule 1  . Dextromethorphan-guaiFENesin (MUCINEX DM MAXIMUM STRENGTH) 60-1200 MG TB12 Take 1 tablet by mouth daily.  (Patient not taking: Reported on 04/13/2020)    . EPINEPHrine 0.3 mg/0.3 mL IJ SOAJ injection Inject 0.3 mLs (0.3 mg total) into the muscle as needed (for angioedema). (Patient not taking: Reported on 04/13/2020) 2 Device 0   No current facility-administered medications on file prior to visit.    ALLERGIES: Allergies  Allergen Reactions  . Perphenazine Other (See Comments)    Tongue swelling  . Amiodarone Swelling  . Codeine Itching  . Tramadol Other (See Comments)    dellusion     FAMILY HISTORY: Family History  Problem Relation Age of Onset  . Aortic aneurysm Mother 28       cause of death  . Other Father        motor vehicle accident  . Heart disease Other        family history    SOCIAL HISTORY: Social History   Socioeconomic History  . Marital status: Married    Spouse name: Not on file  . Number of children: Not on file  . Years of education: Not on file  . Highest education level: Not on file  Occupational History  . Occupation: disabled    Comment: owner of buisness  Tobacco Use  . Smoking status: Former Smoker    Packs/day: 0.50    Years: 27.00    Pack years: 13.50    Types: Cigarettes    Quit date: 10/21/1979    Years since quitting: 40.5  .  Smokeless tobacco: Never Used  Vaping Use  . Vaping Use:  Never used  Substance and Sexual Activity  . Alcohol use: Yes    Alcohol/week: 4.0 standard drinks    Types: 4 Cans of beer per week    Comment: drinks "as much as I could" - 2-3 beers up to 8-10  . Drug use: Never  . Sexual activity: Not on file  Other Topics Concern  . Not on file  Social History Narrative   Right handed   One story home   Drinks no caffeine   Social Determinants of Health   Financial Resource Strain:   . Difficulty of Paying Living Expenses:   Food Insecurity:   . Worried About Charity fundraiser in the Last Year:   . Arboriculturist in the Last Year:   Transportation Needs:   . Film/video editor (Medical):   Marland Kitchen Lack of Transportation (Non-Medical):   Physical Activity:   . Days of Exercise per Week:   . Minutes of Exercise per Session:   Stress:   . Feeling of Stress :   Social Connections:   . Frequency of Communication with Friends and Family:   . Frequency of Social Gatherings with Friends and Family:   . Attends Religious Services:   . Active Member of Clubs or Organizations:   . Attends Archivist Meetings:   Marland Kitchen Marital Status:   Intimate Partner Violence:   . Fear of Current or Ex-Partner:   . Emotionally Abused:   Marland Kitchen Physically Abused:   . Sexually Abused:     PHYSICAL EXAM: Vitals:   04/13/20 1124  BP: (!) 144/84  Pulse: 62  Resp: 20  SpO2: 97%   General: No acute distress Head:  Normocephalic/atraumatic Skin/Extremities: No rash, no edema Neurological Exam: alert and oriented to person, place, and time. No aphasia or dysarthria. Fund of knowledge is appropriate.  Recent and remote memory are intact.  Attention and concentration are normal.   Cranial nerves: Pupils equal, round, reactive to light. Extraocular movements intact with no nystagmus. Visual fields full. No facial asymmetry.  Motor: Bulk and tone normal, muscle strength 5/5 throughout with no pronator drift.  Finger to nose testing intact.  Gait slow and  cautious,no ataxia   IMPRESSION: This is a 69 yo RH man with a history of OSA, atrial fibrillation on chronic anticoagulation, aortic valve replacement, depression, chronic fatigue syndrome/fibromyalgia, who had an episode of confusion last April 2020 and December 2020. MRI brain and EEG unremarkable. Differentials include complicated migraines, TGA, versus seizure, he has had an increase in headaches around the time of both episodes. He has declined prolonged EEG for now. He will be starting Aimovig today for migraine prophylaxis. He will be scheduled for follow-up MRA for incidental finding of small 34mm outpouching at left MCA bifurcation suggestive of aneurysm. Continue migraine calendar. Follow-up in 6 months, they know to call for any changes.    Thank you for allowing me to participate in his care.  Please do not hesitate to call for any questions or concerns.   Ellouise Newer, M.D.   CC: Dr. Kenton Kingfisher

## 2020-04-24 ENCOUNTER — Telehealth: Payer: Self-pay | Admitting: Neurology

## 2020-04-24 NOTE — Telephone Encounter (Signed)
Left VM for wife telling her that the earliest appt for the MRA available is 05/08/2020 but that could be taken if not secured and requested call back ASAP.  Wife called back and states they will keep MRA appt already scheduled in August 2021.

## 2020-04-24 NOTE — Telephone Encounter (Signed)
Patient's wife called in regarding the MRI that was scheduled for him. She said they had to schedule it all the way out in August. She was wanting to see if Dr. Delice Lesch felt that he needed it earlier? If so could she send something to them to get him in faster? They are having construction done on their home the next 2 weeks and cannot be in their house, so they will be going out of town to stay with family. She would like to get him in this week if possible.

## 2020-04-25 ENCOUNTER — Other Ambulatory Visit: Payer: Self-pay | Admitting: Internal Medicine

## 2020-04-25 DIAGNOSIS — R3121 Asymptomatic microscopic hematuria: Secondary | ICD-10-CM | POA: Diagnosis not present

## 2020-04-25 DIAGNOSIS — R059 Cough, unspecified: Secondary | ICD-10-CM

## 2020-04-25 MED ORDER — BENZONATATE 200 MG PO CAPS: ORAL_CAPSULE | ORAL | 0 refills | Status: DC

## 2020-04-28 ENCOUNTER — Ambulatory Visit
Admission: RE | Admit: 2020-04-28 | Discharge: 2020-04-28 | Disposition: A | Discharge status: Home / Self Care | Payer: Medicare Other | Source: Ambulatory Visit | Attending: Neurology | Admitting: Neurology

## 2020-04-28 ENCOUNTER — Other Ambulatory Visit: Payer: Self-pay

## 2020-04-28 DIAGNOSIS — G43109 Migraine with aura, not intractable, without status migrainosus: Secondary | ICD-10-CM

## 2020-04-28 DIAGNOSIS — I671 Cerebral aneurysm, nonruptured: Secondary | ICD-10-CM

## 2020-04-28 DIAGNOSIS — G454 Transient global amnesia: Secondary | ICD-10-CM

## 2020-04-29 ENCOUNTER — Encounter: Payer: Self-pay | Admitting: Dermatology

## 2020-04-30 ENCOUNTER — Telehealth: Payer: Self-pay

## 2020-04-30 NOTE — Telephone Encounter (Signed)
Patient advised of MRA results.

## 2020-05-01 ENCOUNTER — Other Ambulatory Visit: Payer: Self-pay | Admitting: Internal Medicine

## 2020-05-28 ENCOUNTER — Other Ambulatory Visit: Payer: Medicare Other

## 2020-05-31 ENCOUNTER — Other Ambulatory Visit: Payer: Self-pay | Admitting: Internal Medicine

## 2020-05-31 DIAGNOSIS — G4733 Obstructive sleep apnea (adult) (pediatric): Secondary | ICD-10-CM | POA: Diagnosis not present

## 2020-06-01 ENCOUNTER — Telehealth: Payer: Self-pay | Admitting: Internal Medicine

## 2020-06-01 MED ORDER — AMOXICILLIN-POT CLAVULANATE 875-125 MG PO TABS
1.0000 | ORAL_TABLET | Freq: Two times a day (BID) | ORAL | 0 refills | Status: DC
Start: 2020-06-01 — End: 2020-09-11

## 2020-06-01 NOTE — Telephone Encounter (Signed)
Spoke with wife, Juliann Pulse, listed on [place].  Patient had a sinus infection while they have been at [facility] awaiting their house in [place] to be remodeled d/t mold, they will not be back until the end of September.  She is requesting a refill for the Amoxicillin Clavulanate 875.  He took the last dose when he had the sinus infection and it cleared it right up.  Dr. Melvyn Novas, Please advise.  Thank you.

## 2020-06-01 NOTE — Telephone Encounter (Signed)
ATC, left message for wife, Juliann Pulse (listed on DPR).  Pt. Last seen 07/07/19, will need to schedule a f/u appointment when she returns call.  Will await call.

## 2020-06-01 NOTE — Telephone Encounter (Signed)
Called let Juliann Pulse know that medication was called in to pharmacy in [place] as requested. Along with Dr. Gustavus Bryant recommendations.   Nothing further needed at this time.

## 2020-06-01 NOTE — Telephone Encounter (Signed)
Augmentin 875 mg take one pill twice daily  X 10 days - take at breakfast and supper with large glass of water.  It would help reduce the usual side effects (diarrhea and yeast infections) if you ate cultured yogurt at lunch.  

## 2020-06-08 ENCOUNTER — Telehealth: Payer: Self-pay | Admitting: Internal Medicine

## 2020-06-08 DIAGNOSIS — J189 Pneumonia, unspecified organism: Secondary | ICD-10-CM | POA: Diagnosis not present

## 2020-06-08 DIAGNOSIS — J4540 Moderate persistent asthma, uncomplicated: Secondary | ICD-10-CM | POA: Diagnosis not present

## 2020-06-08 NOTE — Telephone Encounter (Signed)
MW please advise on patient email.  Thanks!   Good Afternoon Lauren,  I just called your office and left a message.Here is where we are to date. I did refill Johns Rx you sent and he has now taken the first course and because he couldn't shake what we thought was his usual bronchial problem I went ahead and started him on the second course you sent. He seems to be better somewhat but because it wont go away I took him to [facility] today (see attached) and they diagnosed him w Pneumonia of right lobe. The PA told me has rales? See attached. We asked them repeatedly for a Depo shot which they finally gave him (Solu-Medrol 125mg /64mL (methylprednisolone) IM Intramuscular Jun 08, 2020 Administered)  and they have rxd a 6 day treatment of methylPREDNISolone 4 MG as directed Orally as directed for 6 days 20 Aug,2021 starting tomorrow. Muhsin is extremely worried he wont sleep and usually prednisone tablets make him crazy after he doesn't sleep. What are Dr. Morrison Old thoughts/suggestions? Does he want [place] to consult with Dr. Kenton Kingfisher his Primary. We are still out of town at [facility].  Thank you,  Aura Dials

## 2020-06-08 NOTE — Telephone Encounter (Signed)
MW message from earlier:   "Since I'm not there my advice is to follow the most recent provider's recs"  Spoke with Juliann Pulse. She is aware of MW's recs. Nothing further needed at time of call.

## 2020-06-08 NOTE — Telephone Encounter (Signed)
Since I'm not there my advice is to follow the most recent provider's recs

## 2020-06-11 ENCOUNTER — Other Ambulatory Visit: Payer: Self-pay | Admitting: Internal Medicine

## 2020-06-11 DIAGNOSIS — R059 Cough, unspecified: Secondary | ICD-10-CM

## 2020-06-20 DIAGNOSIS — J205 Acute bronchitis due to respiratory syncytial virus: Secondary | ICD-10-CM | POA: Diagnosis not present

## 2020-06-20 DIAGNOSIS — Z79899 Other long term (current) drug therapy: Secondary | ICD-10-CM | POA: Diagnosis not present

## 2020-06-20 DIAGNOSIS — R0602 Shortness of breath: Secondary | ICD-10-CM | POA: Diagnosis not present

## 2020-06-20 DIAGNOSIS — I10 Essential (primary) hypertension: Secondary | ICD-10-CM | POA: Diagnosis not present

## 2020-06-20 DIAGNOSIS — Z87891 Personal history of nicotine dependence: Secondary | ICD-10-CM | POA: Diagnosis not present

## 2020-06-20 DIAGNOSIS — Z20822 Contact with and (suspected) exposure to covid-19: Secondary | ICD-10-CM | POA: Diagnosis not present

## 2020-06-20 DIAGNOSIS — E039 Hypothyroidism, unspecified: Secondary | ICD-10-CM | POA: Diagnosis not present

## 2020-06-20 DIAGNOSIS — R509 Fever, unspecified: Secondary | ICD-10-CM | POA: Diagnosis not present

## 2020-06-22 NOTE — Telephone Encounter (Signed)
Dr. Melvyn Novas, Please see update on patient.  Thank you.

## 2020-06-23 ENCOUNTER — Other Ambulatory Visit: Payer: Self-pay | Admitting: Internal Medicine

## 2020-07-05 ENCOUNTER — Other Ambulatory Visit: Payer: Self-pay | Admitting: Internal Medicine

## 2020-07-05 DIAGNOSIS — R059 Cough, unspecified: Secondary | ICD-10-CM

## 2020-07-05 NOTE — Telephone Encounter (Signed)
Dr. Melvyn Novas, please advise if you are okay refilling med for pt. Pt does have a f/u appt scheduled with you on 9/30.

## 2020-07-10 DIAGNOSIS — Z0120 Encounter for dental examination and cleaning without abnormal findings: Secondary | ICD-10-CM | POA: Diagnosis not present

## 2020-07-19 ENCOUNTER — Ambulatory Visit: Payer: Medicare Other | Admitting: Internal Medicine

## 2020-07-30 ENCOUNTER — Other Ambulatory Visit: Payer: Self-pay | Admitting: Internal Medicine

## 2020-08-08 ENCOUNTER — Ambulatory Visit: Payer: Medicare Other | Admitting: Cardiothoracic Surgery

## 2020-08-15 ENCOUNTER — Ambulatory Visit: Payer: Medicare Other | Admitting: Internal Medicine

## 2020-09-05 ENCOUNTER — Ambulatory Visit: Payer: Medicare Other | Admitting: Cardiothoracic Surgery

## 2020-09-06 DIAGNOSIS — G4733 Obstructive sleep apnea (adult) (pediatric): Secondary | ICD-10-CM | POA: Diagnosis not present

## 2020-09-11 ENCOUNTER — Ambulatory Visit: Payer: Medicare Other | Admitting: Internal Medicine

## 2020-09-11 ENCOUNTER — Telehealth: Payer: Self-pay | Admitting: Internal Medicine

## 2020-09-11 ENCOUNTER — Encounter: Payer: Self-pay | Admitting: Internal Medicine

## 2020-09-11 ENCOUNTER — Other Ambulatory Visit: Payer: Self-pay

## 2020-09-11 DIAGNOSIS — R059 Cough, unspecified: Secondary | ICD-10-CM

## 2020-09-11 DIAGNOSIS — J45991 Cough variant asthma: Secondary | ICD-10-CM

## 2020-09-11 DIAGNOSIS — R058 Other specified cough: Secondary | ICD-10-CM | POA: Diagnosis not present

## 2020-09-11 MED ORDER — METHYLPREDNISOLONE ACETATE 80 MG/ML IJ SUSP
120.0000 mg | Freq: Once | INTRAMUSCULAR | Status: AC
  Administered 2020-09-11: 120 mg via INTRAMUSCULAR

## 2020-09-11 NOTE — Telephone Encounter (Signed)
LMTCB x1 for pt's wife, Juliann Pulse.

## 2020-09-11 NOTE — Progress Notes (Signed)
Grant [name], male    DOB: 09/23/1951      MRN: [id number]    Brief patient profile:  16 yowm quit smoking 1981 with no cough or sob at that point and then developed prolonged cough in winter while working in ? [name] basement" at [facility] [employer] [facility] [employer] around 2005 and q winter since then same pattern but became more of a chronic year round pattern around 2015  Some  worse esp in winter usually better in summer mostly dry but more productive since summer of 2019 variably purulent.   Patient timeline listed below: 06/10/2018-[facility]-cough and congestion 06/25/2018-flu shot and Prevnar 07/22/2018- returned from beach with bad cough 07/26/2018- echo with Grant. Darcey [name] 08/11/2018-[facility]-cough with Depo shot 08/20/2018-[facility] 08/24/2018-[facility] cough and Depo shot 09/22/18 -[facility]-Z-Pak and Depo-Medrol injection 12/30/2019[name]- Depo-Medrol and Levaquin      11/15/2018   1st office eval/Grant [name] re cough x acute on chronic worse since late summer 2019  And turned brown just x few weeks  Chief Complaint  Patient presents with  . Pulmonary Consult    Former patient of Grant [name]. Pt c/o cough since Sept 2019.  Cough is prod with brown sputum. He coughs the most in the morning when he wakes up. He occ wakes up in the night coughing.   assoc with sensation pnds/ best rx is depomedrol 120 mg IM  Advair rx now worse with advair and prev worse with symb 160 but hfa poor baseline zpak now Med list not accurate No increase chronic doe x  MMRC3 = can't walk 100 yards even at a slow pace at a flat grade s stopping due to sob     [name] Reflux v Neurogenic Cough Differentiator Reflux Comments  Do you awaken from a sound sleep coughing violently?                            With trouble breathing? Some nights Unless cough med    Do you have choking episodes when you cannot  Get enough air, gasping for air ?               Yes   Do you usually cough when you lie down into  The bed, or when you just lie down to rest ?                          < 45 degrees start coughing   Do you usually cough after meals or eating?         Big meal   Do you cough when (or after) you bend over?    No    GERD SCORE     [name] Reflux v Neurogenic Cough Differentiator Neurogenic   Do you more-or-less cough all day long? All day   Does change of temperature make you cough? No    Does laughing or chuckling cause you to cough? sometimes   Do fumes (perfume, automobile fumes, burned  Toast, etc.,) cause you to cough ?      no   Does speaking, singing, or talking on the phone cause you to cough   ?               Talking    Neurogenic/Airway score        No obvious other patterns in day to day or  daytime variability or assoc   mucus plugs or hemoptysis or cp or chest tightness, subjective wheeze or overt   hb symptoms ( on ppi bid but not ac)     rec If the mucus doesn't turn clear >>> omnicef 300 mg twice daily x 10 days For cough / congestion >  mucinex dm 1200 mg every 12 hours supplement with tessilon and no delsym  Depomedrol 120 mg IM  Symbicort 80 Take 2 puffs first thing in am and then another 2 puffs about 12 hours later.  Only use your levoalbuterol as a rescue medication  Please schedule a follow up office visit in 2 weeks, call sooner if needed with all medications /inhalers/ solutions in hand    11/29/2018  f/u ov/Grant [name] re: chronic throat clearing x decades/  cough worse since summer 2019 / confused with meds  Chief Complaint  Patient presents with  . Follow-up    Cough has improved slightly. He uses his xopenex and ventolin every few days.   Dyspnea: denies now but very sedentary  Cough: min improved  Sleeping: no bed blocks  SABA use: using maint alb and xopenex  02: none / cpap does  fine per Grant [name]  rec Delsym two tsp twice daily automatically  Symbicort 160 Take 2 puffs first thing in am and then  another 2 puffs about 12 hours later.  Only use your albuterol and levoalbuterol as a rescue medication Omeprazole 40 mg Take 30- 60 min before your first and last meals of the day  GERD diet  See Grant [name] w/in 2 weeks with all your medications,   - late: add add gabapentin 100 tid if still throat cleairng on delsym 2 tsp bid and tessalon prn  Improved by 12/16/18 and given medication calendar  [name] eval and recs  12/20/18 acutely worse cough  Mucinex Twice daily  As needed  Cough  Deslym 2 tsp Twice daily  As needed  Cough Tessalon Three times a day  As needed  Cough .  Try Chlortabs 4mg  Twice daily  As needed  Drainage , throat clearing , drippy nose.  Steroid injection today .   06/05/19 another flare x 10 days > zpak / depomedrol    07/07/2019  f/u ov/Grant [name] re: daily cough x 2015 best rx p ov 11/29/18 but note worse again by 12/20/18 and really never stopped throat clearing.  Chief Complaint  Patient presents with  . Acute office visit     Cough has been worse over the past few wks and is occ prod with brown sputum. He is using his albuterol inhaler 1-2 x per day on average.   Dyspnea:  Doe x 50 ft  Cough: w/in a few hours of getting out of bed starts up coughing with min mucus production slt brown and assoc with his "usual" sense of pnds but nothing purulent from nose   Sleeping: bed blocks and cpap tends to sleep fine  SABA use: up to twice daily at most plus symb 160 2bid maint and hfa 75% effective technique  02: none  rec Augmentin 875 mg take one pill twice daily  X 10 days   Depomedrol 120 mg  IM today  GERD diet For cough >  mucinex dm 1200 mg every 12 hours as needed and if still coughing add the tessalon to it. See calendar for specific medication    09/11/2020  f/u ov/Grant [name] re: cough 2005  symbicort 160  2 q 12 h /  covid vaccine x 3 doses Chief Complaint  Patient presents with  . Follow-up    Cough is about the same since his last visit. Breathing is unchanged. He is  using his albuterol inhaler 5-6 x per wk on average.   Dyspnea:  MMRC2 = can't walk a nl pace on a flat grade s sob but does fine slow and flat  Cough sporadic but daytime min mucoid  Sleeping: on cpap, cough is less and a few hours after taking it off and stirring starts up again  SABA use: as above  02:none   No obvious day to day or daytime variability or assoc excess/ purulent sputum or mucus plugs or hemoptysis or cp or chest tightness, subjective wheeze or overt sinus or hb symptoms.   Sleeping on cpap  without nocturnal  or early am exacerbation  of respiratory  c/o's or need for noct saba. Also denies any obvious fluctuation of symptoms with weather or environmental changes or other aggravating or alleviating factors except as outlined above   No unusual exposure hx or h/o childhood pna/ asthma or knowledge of premature birth.  Current Allergies, Complete Past Medical History, Past Surgical History, Family History, and Social History were reviewed in [employer] record.  ROS  The following are not active complaints unless bolded Hoarseness, sore throat, dysphagia, dental problems, itching, sneezing,  nasal congestion or discharge of excess mucus or purulent secretions, ear ache,   fever, chills, sweats, unintended wt loss or wt gain, classically pleuritic or exertional cp,  orthopnea pnd or arm/hand swelling  or leg swelling, presyncope, palpitations, abdominal pain, anorexia, nausea, vomiting, diarrhea  or change in bowel habits or change in bladder habits, change in stools or change in urine, dysuria, hematuria,  rash, arthralgias, visual complaints, headache, numbness, weakness or ataxia or problems with walking or coordination,  change in mood or  memory.        Current Meds  Medication Sig  . acetaminophen (TYLENOL) 500 MG tablet Take 1,000 mg by mouth every 8 (eight) hours as needed for mild pain, fever or headache. Per bottle as needed  . albuterol  (VENTOLIN HFA) 108 (90 Base) MCG/ACT inhaler INHALE 2 PUFFS INTO THE LUNGS EVERY 6 HOURS AS NEEDED FOR WHEEZING OR SHORTNESS OF BREATH  . azelastine (ASTELIN) 137 MCG/SPRAY nasal spray Place 1-2 sprays into the nose 2 (two) times daily as needed for rhinitis or allergies.   . benzonatate (TESSALON) 200 MG capsule TAKE 1 CAPSULE BY MOUTH EVERY 8 HOURS AS NEEDED FOR COUGH  . clobetasol ointment (TEMOVATE) 0.10 % Apply 1 application topically daily.  Marland Kitchen COLCRYS 0.6 MG tablet Take 0.6 mg by mouth daily.   Marland Kitchen Dextromethorphan-guaiFENesin (MUCINEX DM MAXIMUM STRENGTH) 60-1200 MG TB12 Take 1 tablet by mouth daily.   Marland Kitchen DHEA 50 MG TABS Take 50 mg by mouth daily with breakfast.   . EPINEPHrine 0.3 mg/0.3 mL IJ SOAJ injection Inject 0.3 mLs (0.3 mg total) into the muscle as needed (for angioedema).  Grant [name] (AIMOVIG) 140 MG/ML SOAJ Inject 140 mg into the skin every 30 (thirty) days.  . finasteride (PROSCAR) 5 MG tablet Take 5 mg by mouth at bedtime.   Marland Kitchen levothyroxine (SYNTHROID, LEVOTHROID) 112 MCG tablet Take 112 mcg by mouth daily before breakfast.   . liothyronine (CYTOMEL) 25 MCG tablet Take 25 mcg by mouth daily.   Marland Kitchen losartan (COZAAR) 50 MG tablet TAKE 1 TABLET BY MOUTH EVERY DAY  . Methylsulfonylmethane (MSM) POWD Take 4  g by mouth daily.  . metoprolol tartrate (LOPRESSOR) 50 MG tablet Take 1 tablet (50 mg total) by mouth 2 (two) times daily.  . mupirocin ointment (BACTROBAN) 2 % Apply to open sores qd-bid as needed  . omeprazole (PRILOSEC) 40 MG capsule Take 1 capsule (40 mg total) by mouth 2 (two) times daily.  . rivaroxaban (XARELTO) 20 MG TABS tablet Take 1 tablet (20 mg total) by mouth daily with supper.  . rizatriptan (MAXALT) 10 MG tablet Take 1 tablet (10 mg total) by mouth as needed for migraine. May repeat in 2 hours if needed  . SYMBICORT 160-4.5 MCG/ACT inhaler INHALE 2 PUFFS INTO THE LUNGS TWICE DAILY  . tamsulosin (FLOMAX) 0.4 MG CAPS capsule Take 0.4 mg by mouth daily after  supper.  . testosterone cypionate (DEPOTESTOTERONE CYPIONATE) 100 MG/ML injection Inject 200 mg into the muscle every Thursday. For IM use only   . venlafaxine (EFFEXOR-XR) 150 MG 24 hr capsule Take 150 mg by mouth daily with breakfast.               Objective:      amb obese wm nad with freq throat clearing    09/11/2020     280  07/07/2019       265   11/29/18 272 lb (123.4 kg)  11/15/18 274 lb (124.3 kg)  11/08/18 267 lb (121.1 kg)     Vital signs reviewed  09/11/2020  - Note at rest 02 sats  99% on RA   HEENT : pt wearing mask not removed for exam due to covid -19 concerns.    NECK :  without JVD/Nodes/TM/ nl carotid upstrokes bilaterally   LUNGS: no acc muscle use,  Nl contour chest which is clear to A and P bilaterally without cough on insp or exp maneuvers   CV:  RRR  no s3 or murmur or increase in P2, and no edema   ABD:  soft and nontender with nl inspiratory excursion in the supine position. No bruits or organomegaly appreciated, bowel sounds nl  MS:  Nl gait/ ext warm without deformities, calf tenderness, cyanosis or clubbing No obvious joint restrictions   SKIN: warm and dry without lesions    NEURO:  alert, approp, nl sensorium with  no motor or cerebellar deficits apparent.        Labs ordered 09/11/2020  :  allergy profile > did not go to lab  Assessment

## 2020-09-11 NOTE — Patient Instructions (Addendum)
For cough >  mucinex dm 1200 mg every 12 hours as needed and if still coughing add the tessalon to it.  For drainage  > chlorpheniramine up to every 4 hours as needed    Depomedrol 120 mg IM today   Sinus CT will be scheduled and call the results     Please schedule a follow up office visit in 6 weeks, call sooner if needed with all medications /inhalers/ solutions in hand so we can verify exactly what you are taking. This includes all medications from all doctors and over the [place] separate them into two bags:  the ones you take automatically, no matter what, vs the ones you take just when you feel you need them "BAG #2 is UP TO YOU"  - this will really help [place] help you take your medications more effectively.   Bring your med calendar with you

## 2020-09-12 ENCOUNTER — Encounter: Payer: Self-pay | Admitting: Internal Medicine

## 2020-09-12 NOTE — Assessment & Plan Note (Signed)
Body mass index is 40.18 kg/m.  -  Trending up Lab Results  Component Value Date   TSH 0.951 02/16/2019     Contributing to gerd risk/ doe/reviewed the need and the process to achieve and maintain neg calorie balance > defer f/u primary care including intermittently monitoring thyroid status

## 2020-09-12 NOTE — Assessment & Plan Note (Signed)
Advised re 1st gen H1 blockers per guidelines  And need for CT sinus          Each maintenance medication was reviewed in detail including emphasizing most importantly the difference between maintenance and prns and under what circumstances the prns are to be triggered using an action plan format where appropriate.  Total time for H and P, chart review, counseling, teaching device and generating customized AVS unique to this office visit / charting = 31 min

## 2020-09-12 NOTE — Assessment & Plan Note (Signed)
Quit smoking 1981 with Onset of cough around ? 2005 (throat clearing x decades) became daily around 2015 - Spirometry 11/15/2018  FEV1 3.1 (89%)  Ratio 0.74 s curvature ? On advair - FENO 11/15/2018  =   7 - symb 80 2bid 11/15/2018 > ? Helped but pt returned on symb 160 plus maint saba x 2 different versions so rec continue symb 160  - flared early Aug 2020 wit reported brown mucus and nl cxr > pulmonary ov 07/07/2019  rec augmentin x 10days and continue symb 160 2bid / depomedrol 120 mg IM > transient improvement   Next steps are allergy profile and sinus ct but pt left w/o getting labs after I had instructed him:  The standardized cough guidelines published in Chest by Lissa Morales in 2006 are still the best available and consist of a multiple step process (up to 12!) , not a single office visit,  and are intended  to address this problem logically,  with an alogrithm dependent on response to empiric treatment at  each progressive step  to determine a specific diagnosis with  minimal addtional testing needed. Therefore if adherence is an issue or can't be accurately verified,  it's very unlikely the standard evaluation and treatment will be successful here.    Furthermore, response to therapy (other than acute cough suppression, which should only be used short term with avoidance of narcotic containing cough syrups if possible), can be a gradual process for which the patient is not likely to  perceive immediate benefit.  Unlike going to an eye doctor where the best perscription is almost always the first one and is immediately effective, this is almost never the case in the management of chronic cough syndromes. Therefore the patient needs to commit up front to consistently adhere to recommendations  for up to 6 weeks of therapy directed at the likely underlying problem(s) before the response can be reasonably evaluated.    Next step is therefore return with all meds in hand using a trust but verify  approach to confirm accurate Medication  Reconciliation The principal here is that until we are certain that the  patients are doing what we've asked, it makes no sense to ask them to do more.    To keep things simple, I have asked the patient to first separate medicines that are perceived as maintenance, that is to be taken daily "no matter what", from those medicines that are taken on only on an as-needed basis and I have given the patient examples of both, and then return to see me with both bags and the med calendar we previously generated form him to be sure we're on same wavelength

## 2020-09-18 ENCOUNTER — Other Ambulatory Visit: Payer: Self-pay

## 2020-09-18 ENCOUNTER — Ambulatory Visit (INDEPENDENT_AMBULATORY_CARE_PROVIDER_SITE_OTHER)
Admission: RE | Admit: 2020-09-18 | Discharge: 2020-09-18 | Disposition: A | Discharge status: Home / Self Care | Payer: Medicare Other | Source: Ambulatory Visit | Attending: Internal Medicine | Admitting: Internal Medicine

## 2020-09-18 DIAGNOSIS — J3489 Other specified disorders of nose and nasal sinuses: Secondary | ICD-10-CM | POA: Diagnosis not present

## 2020-09-18 DIAGNOSIS — R059 Cough, unspecified: Secondary | ICD-10-CM

## 2020-09-18 DIAGNOSIS — R058 Other specified cough: Secondary | ICD-10-CM

## 2020-09-19 NOTE — Progress Notes (Signed)
Date:  09/24/2020   ID:  Grant [name], DOB 16-Apr-1951, MRN [id number]  PCP:  Shirline Frees, MD  Cardiologist: Dr. [name]  EP: Dr. [name]  Chief Complaint: PAF/AVR   History of Present Illness:   69 y.o. history of PAF, HTN, OSA on CPAP with AVR PVT  for bicuspid valve 2018 Bentall  (37mm Edwards pericardial Magna Ease)  No CAD at cath done 06/12/17 prior to surgery   Had recurrent PAF 07/28/17 converted with iv cardizem and seen by EP Dr [name] who recommended amiodarone 200 mg daily and changed back to  xarelto for anticoagulation   History of  angioedema. 2020 seen by Dr  Fabian November ER and given steroids and benadryl  Was not On ACE or ARB He does not tolerate oral steroids and given zyrtec pepcid and steroid injection Told to discuss with primary stopping Tilafon (antipsychoitic)  and Proscar which have been implicated in angioedema Had recurrence off amiodarone so clearly not this Had ? Global amnesia episode April 2020 MRI head normal   Echo 08/05/18 EF 60-65%  AVR mean gradient 16 peak 33 mmHg no AR  Spending a [name] of time at [place] and active at [facility]  Had RSV infection in September   Known fracture of sternal wires but no issues   Has had vaccines Having mold issues with property at [facility]   Past Medical History:  Diagnosis Date  . Allergic rhinitis   . Anxiety   . Arthritis   . Asthma   . Bladder tumor   . Chronic fatigue   . Depression    06/30/17 Pt denies being depressed, reports Effexor is taken for Chronic Fatigue   . Dyspnea   . Enlarged prostate   . Fibromyalgia   . GERD (gastroesophageal reflux disease)   . Headache   . History of chronic bronchitis   . History of migraine   . History of toxic encephalopathy   . Hypothyroidism   . OSA on CPAP    CPAP 14  . PAF (paroxysmal atrial fibrillation) ([place]) CARDIOLOGIST -- DR [name]   DX OCT 2013  . 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 Unspecified essential hypertension   . Urethral tumor     PROSTATIC    Past Surgical History:  Procedure Laterality Date  . BENTALL PROCEDURE N/A 07/02/2017   Procedure: BENTALL PROCEDURE;  Surgeon: Ivin Poot, MD;  Location: [place];  Service: Open Heart Surgery;  Laterality: N/A;  WITH CIRC ARREST  . CARDIAC SURGERY    . COLONOSCOPY    . CYSTOSCOPY W/ RETROGRADES Bilateral 06/15/2013   Procedure: CYSTOSCOPY WITH BILATERAL RETROGRADE PYELOGRAM  BLADDER BIOPSY, PROSTATIC URETHRAL BIOPSY, ;  Surgeon: Molli Hazard, MD;  Location: [facility];  Service: Urology;  Laterality: Bilateral;  . ESOPHAGOGASTRODUODENOSCOPY (EGD) WITH PROPOFOL N/A 03/11/2018   Procedure: ESOPHAGOGASTRODUODENOSCOPY (EGD) WITH PROPOFOL;  Surgeon: Clarene Essex, MD;  Location: [place];  Service: Endoscopy;  Laterality: N/A;  . LAPAROSCOPIC CHOLECYSTECTOMY  01-14-2001  . RIGHT/LEFT HEART CATH AND CORONARY ANGIOGRAPHY N/A 06/12/2017   Procedure: RIGHT/LEFT HEART CATH AND CORONARY ANGIOGRAPHY;  Surgeon: Larey Dresser, MD;  Location: [place] [facility];  Service: Cardiovascular;  Laterality: N/A;  . TEE WITHOUT CARDIOVERSION N/A 07/02/2017   Procedure: TRANSESOPHAGEAL ECHOCARDIOGRAM (TEE);  Surgeon: Prescott Gum, Collier Salina, MD;  Location: [place];  Service: Open Heart Surgery;  Laterality: N/A;  . UMBILICAL HERNIA REPAIR  01-14-2008    Current Medications: Prior to Admission  medications   Medication Sig Start Date End Date Taking? Authorizing Provider  acetaminophen (TYLENOL) 500 MG tablet Take 500 mg by mouth every 6 (six) hours as needed for moderate pain.    [provider]  albuterol (VENTOLIN HFA) 108 (90 Base) MCG/ACT inhaler INHALE 2 PUFFS INTO LUNGS EVERY 6 HOURS AS NEEDED FOR WHEEZING OR SHORTNESS OF BREATH 01/19/17   Baird Lyons D, MD  amiodarone (PACERONE) 200 MG tablet Take 1 tablet (200 mg total) by mouth daily. 07/29/17   Leanor Kail, PA  aspirin 81 MG EC tablet Take 1 tablet (81 mg total) by mouth daily. 07/13/17   Gold, Wilder Glade, PA-C  azelastine (ASTELIN) 137 MCG/SPRAY nasal spray Place 1-2 sprays into the nose 2 (two) times daily as needed for rhinitis or allergies.     [provider]  benzonatate (TESSALON) 200 MG capsule Take 1 capsule (200 mg total) by mouth every 6 (six) hours as needed for cough. 11/19/16   Baird Lyons D, MD  budesonide-formoterol (SYMBICORT) 160-4.5 MCG/ACT inhaler INHALE 2 PUFF INTO THE LUNGS 2 TIMES A DAY. 01/19/17   Baird Lyons D, MD  Cholecalciferol (VITAMIN D-3) 5000 UNITS TABS Take 5,000 Units by mouth daily with breakfast.     [provider]  COLCRYS 0.6 MG tablet Take 0.6 mg by mouth daily. With breakfast 01/29/12   [provider]  Cyanocobalamin (VITAMIN B12 SL) Place 1,200 mcg under the tongue daily. 1 dropper    [provider]  dextromethorphan (DELSYM) 30 MG/5ML liquid Take 30 mg by mouth at bedtime as needed for cough.     [provider]  DHEA 50 MG TABS Take 50 mg by mouth daily with breakfast.     [provider]  fexofenadine (ALLEGRA) 180 MG tablet Take 180 mg by mouth daily with breakfast.     [provider]  finasteride (PROSCAR) 5 MG tablet Take 5 mg by mouth at bedtime.  01/14/14   [provider]  Hypromellose Vertell Limber ALLERGY NASAL SPRAY NA) Place 1 spray into both nostrils daily as needed (for allergies).     [provider]  levothyroxine (SYNTHROID, LEVOTHROID) 112 MCG tablet Take 112 mcg by mouth daily before breakfast.     [provider]  liothyronine (CYTOMEL) 25 MCG tablet Take 25 mcg by mouth every morning.     [provider]  magic mouthwash w/lidocaine SOLN Take 10 mLs by mouth 4 (four) times daily as needed for mouth pain. 07/16/17   Deneise Lever, MD  Menthol, Topical Analgesic, (BLUE-EMU MAXIMUM STRENGTH EX) Apply 1 application topically 4 (four) times daily as needed (for arthritis pain.).    [provider]  metoprolol tartrate (LOPRESSOR) 50 MG  tablet Take 1 tablet (50 mg total) by mouth 2 (two) times daily. 07/29/17   Bhagat, Bhavinkumar, PA  milk thistle 175 MG tablet Take 175 mg by mouth daily.    [provider]  Multiple Vitamins-Minerals (ONE-A-DAY MENS HEALTH FORMULA PO) Take 1 tablet by mouth daily with breakfast.     [provider]  omeprazole (PRILOSEC) 40 MG capsule Take 1 capsule (40 mg total) by mouth 2 (two) times daily. 10/31/16   Josue Hector, MD  perphenazine (TRILAFON) 2 MG tablet Take 2 mg by mouth 2 (two) times daily as needed (for migraines).     [provider]  Probiotic Product (ALIGN) 4 MG CAPS Take 4 mg by mouth daily with breakfast.     [provider]  tamsulosin (FLOMAX) 0.4 MG CAPS capsule Take 0.4 mg by mouth daily after supper.    [provider]  testosterone cypionate (DEPOTESTOTERONE CYPIONATE) 100 MG/ML injection Inject 200 mg into the muscle every Thursday. For IM use only     [provider]  tetrahydrozoline-zinc (VISINE-AC) 0.05-0.25 % ophthalmic solution Place 1 drop into both eyes as needed (dry eyes).    [provider]  venlafaxine (EFFEXOR-XR) 150 MG 24 hr capsule Take 150 mg by mouth every morning.     [provider]  warfarin (COUMADIN) 2.5 MG tablet Take 1 tablet (2.5 mg total) by mouth daily at 6 PM. Take 7.5mg  (3 tablet tonight) and 5mg  tomorrow and INR check Friday Patient taking differently: Take 2.5 mg by mouth daily at 6 PM. Take 7.5mg  (3 tablet tonight) and 5mg  tomorrow and INR check Friday and as directed 07/29/17   Leanor Kail, PA    Allergies:   Perphenazine, Amiodarone, Codeine, and Tramadol   Social History   Socioeconomic History  . Marital status: Married    Spouse name: Not on file  . Number of children: Not on file  . Years of education: Not on file  . Highest education level: Not on file  Occupational History  . Occupation: disabled    Comment: owner of buisness  Tobacco Use  .  Smoking status: Former Smoker    Packs/day: 0.50    Years: 27.00    Pack years: 13.50    Types: Cigarettes    Quit date: 10/21/1979    Years since quitting: 40.9  . Smokeless tobacco: Never Used  Vaping Use  . Vaping Use: Never used  Substance and Sexual Activity  . Alcohol use: Yes    Alcohol/week: 4.0 standard drinks    Types: 4 Cans of beer per week    Comment: drinks "as much as I could" - 2-3 beers up to 8-10  . Drug use: Never  . Sexual activity: Not on file  Other Topics Concern  . Not on file  Social History Narrative   Right handed   One story home   Drinks no caffeine   Social Determinants of Health   Financial Resource Strain:   . Difficulty of Paying Living Expenses: Not on file  Food Insecurity:   . Worried About Charity fundraiser in the Last Year: Not on file  . Ran Out of Food in the Last Year: Not on file  Transportation Needs:   . Lack of Transportation (Medical): Not on file  . Lack of Transportation (Non-Medical): Not on file  Physical Activity:   . Days of Exercise per Week: Not on file  . Minutes of Exercise per Session: Not on file  Stress:   . Feeling of Stress : Not on file  Social Connections:   . Frequency of Communication with Friends and Family: Not on file  . Frequency of Social Gatherings with Friends and Family: Not on file  . Attends Religious Services: Not on file  . Active Member of Clubs or Organizations: Not on file  . Attends Archivist Meetings: Not on file  . Marital Status: Not on file     Family History:  The patient's family history includes Aortic aneurysm (age of onset: 66) in his mother; Heart disease in an other family member; Other in his father.   ROS:   Please see the history of present illness.    ROS All other systems reviewed and are  negative.   PHYSICAL EXAM:   VS:  BP 134/90   Pulse 65   Ht 5\' 10"  (1.778 m)   Wt 127.6 kg   SpO2 95%   BMI 40.35 kg/m     Affect appropriate Obese white male   HEENT: normal Neck supple with no adenopathy JVP normal no bruits no thyromegaly Lungs clear with no wheezing and good diaphragmatic motion Heart:  S1/S2 SEM no AR  murmur, no rub, gallop or click PMI normal post sternotomy  Abdomen: benighn, BS positve, no tenderness, no AAA no bruit.  No HSM or HJR Distal pulses intact with no bruits No edema Neuro non-focal Skin warm and dry No muscular weakness    Wt Readings from Last 3 Encounters:  09/24/20 127.6 kg  09/11/20 127 kg  04/13/20 127.5 kg      Studies/Labs Reviewed:   EKG:   02/16/19 SR LAD nonspecific ST changes 09/24/2020 NSR rate 77 sinus arrhythmia stable   Recent Labs: 10/25/2019: Creat 0.97   Lipid Panel    Component Value Date/Time   CHOL 185 02/17/2019 0714   CHOL 184 05/05/2017 0931   TRIG 234 (H) 02/17/2019 0714   HDL 42 02/17/2019 0714   HDL 45 05/05/2017 0931   CHOLHDL 4.4 02/17/2019 0714   VLDL 47 (H) 02/17/2019 0714   LDLCALC 96 02/17/2019 0714   LDLCALC 94 05/05/2017 0931    Additional studies/ records that were reviewed today include:  As above   ASSESSMENT & PLAN:    1. PAF - on xarelto continue beta blocker off amiodarone now   2. HTN - Well controlled.  Continue current medications and low sodium Dash type diet.    3. 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 Prescott Gum 07/02/17. Post op echo with stable gradients no AR  -Amoxacillin called in for SBE - no change in murmur on exam f/u echo October 2022   4. OSA -Compliant with CPAP.  5. 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 F/U allergist cannot take oral steroids due to MS changes   6. Global Amnesia:  F/u neurology MRI/EEG ok   F/U October 2022 with echo for AVR     [name]

## 2020-09-20 NOTE — Telephone Encounter (Signed)
lmtcb for pt's wife, Juliann Pulse.

## 2020-09-24 ENCOUNTER — Ambulatory Visit: Payer: Medicare Other | Admitting: Cardiovascular Disease

## 2020-09-24 ENCOUNTER — Encounter: Payer: Self-pay | Admitting: Cardiovascular Disease

## 2020-09-24 ENCOUNTER — Other Ambulatory Visit: Payer: Self-pay

## 2020-09-24 VITALS — BP 134/90 | HR 65 | Ht 70.0 in | Wt 281.2 lb

## 2020-09-24 DIAGNOSIS — I480 Paroxysmal atrial fibrillation: Secondary | ICD-10-CM

## 2020-09-24 DIAGNOSIS — Z952 Presence of prosthetic heart valve: Secondary | ICD-10-CM | POA: Diagnosis not present

## 2020-09-24 NOTE — Patient Instructions (Addendum)
Medication Instructions:  *If you need a refill on your cardiac medications before your next appointment, please call your pharmacy*  Lab Work: If you have labs (blood work) drawn today and your tests are completely normal, you will receive your results only by: Marland Kitchen MyChart Message (if you have MyChart) OR . A paper copy in the mail If you have any lab test that is abnormal or we need to change your treatment, we will call you to review the results.  Testing/Procedures: Your physician has requested that you have an echocardiogram in October. Echocardiography is a painless test that uses sound waves to create images of your heart. It provides your doctor with information about the size and shape of your heart and how well your heart's chambers and valves are working. This procedure takes approximately one hour. There are no restrictions for this procedure.  Follow-Up: At [facility], you and your health needs are our priority.  As part of our continuing mission to provide you with exceptional heart care, we have created designated Provider Care Teams.  These Care Teams include your primary Cardiologist (physician) and Advanced Practice Providers (APPs -  Physician Assistants and Nurse Practitioners) who all work together to provide you with the care you need, when you need it.  We recommend signing up for the patient portal called "MyChart".  Sign up information is provided on this After Visit Summary.  MyChart is used to connect with patients for Virtual Visits (Telemedicine).  Patients are able to view lab/test results, encounter notes, upcoming appointments, etc.  Non-urgent messages can be sent to your provider as well.   To learn more about what you can do with MyChart, go to [email or web address].    Your next appointment:   10 month(s)  The format for your next appointment:   In Person  Provider:   You may see Dr. [name] or one of the following Advanced Practice Providers on your  designated Care Team:    Truitt Merle, NP  Cecilie Kicks, NP  Kathyrn Drown, NP

## 2020-09-25 NOTE — Telephone Encounter (Signed)
lmtcb for [employer]. Will close encounter due to several unsuccessful attempts to reach pt.

## 2020-09-26 ENCOUNTER — Ambulatory Visit: Payer: Medicare Other | Admitting: Cardiothoracic Surgery

## 2020-09-27 DIAGNOSIS — Z0120 Encounter for dental examination and cleaning without abnormal findings: Secondary | ICD-10-CM | POA: Diagnosis not present

## 2020-09-29 ENCOUNTER — Other Ambulatory Visit: Payer: Self-pay | Admitting: Cardiothoracic Surgery

## 2020-10-01 DIAGNOSIS — M25512 Pain in left shoulder: Secondary | ICD-10-CM | POA: Diagnosis not present

## 2020-10-01 DIAGNOSIS — M79642 Pain in left hand: Secondary | ICD-10-CM | POA: Diagnosis not present

## 2020-10-01 DIAGNOSIS — M25511 Pain in right shoulder: Secondary | ICD-10-CM | POA: Diagnosis not present

## 2020-10-01 DIAGNOSIS — M7072 Other bursitis of hip, left hip: Secondary | ICD-10-CM | POA: Diagnosis not present

## 2020-10-03 ENCOUNTER — Ambulatory Visit: Payer: Medicare Other | Admitting: Cardiothoracic Surgery

## 2020-10-03 ENCOUNTER — Encounter: Payer: Self-pay | Admitting: Cardiothoracic Surgery

## 2020-10-03 ENCOUNTER — Other Ambulatory Visit: Payer: Self-pay

## 2020-10-03 VITALS — BP 155/89 | HR 62 | Temp 98.4°F | Resp 20 | Ht 70.0 in | Wt 275.0 lb

## 2020-10-03 DIAGNOSIS — Z9889 Other specified postprocedural states: Secondary | ICD-10-CM

## 2020-10-03 DIAGNOSIS — Z8679 Personal history of other diseases of the circulatory system: Secondary | ICD-10-CM | POA: Diagnosis not present

## 2020-10-03 MED ORDER — AMOXICILLIN 500 MG PO CAPS: 2000.0000 mg | ORAL_CAPSULE | ORAL | 5 refills | Status: DC

## 2020-10-03 NOTE — Progress Notes (Signed)
PCP is Shirline Frees, MD Referring Provider is Josue Hector, MD  Chief Complaint  Patient presents with  . Follow-up    Hx of Bentall 2018    HPI: Routine 32-month follow-up after biologic Bentall procedure 2018 almost 4 years ago.  Patient is followed closely by Dr. [name]. His last echo a year ago showed good LV function with normal functioning aortic valve, 25 mm magna ease.  His last CTA was performed January 2021 which showed an intact aortic root and ascending aortic repair with some fibrous malunion of the manubrium which has been stable.  He denies any cardiac symptoms.  He is planning on having some dental work done in January and will use amoxicillin which is prescribed for this purpose.  He has appointment to see Dr. [name]  fall 2022 with repeat echocardiogram.   Past Medical History:  Diagnosis Date  . Allergic rhinitis   . Anxiety   . Arthritis   . Asthma   . Bladder tumor   . Chronic fatigue   . Depression    06/30/17 Pt denies being depressed, reports Effexor is taken for Chronic Fatigue   . Dyspnea   . Enlarged prostate   . Fibromyalgia   . GERD (gastroesophageal reflux disease)   . Headache   . History of chronic bronchitis   . History of migraine   . History of toxic encephalopathy   . Hypothyroidism   . OSA on CPAP    CPAP 14  . PAF (paroxysmal atrial fibrillation) ([place]) CARDIOLOGIST -- DR [name]   DX OCT 2013  . 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 Unspecified essential hypertension   . Urethral tumor    PROSTATIC    Past Surgical History:  Procedure Laterality Date  . BENTALL PROCEDURE N/A 07/02/2017   Procedure: BENTALL PROCEDURE;  Surgeon: Ivin Poot, MD;  Location: [place];  Service: Open Heart Surgery;  Laterality: N/A;  WITH CIRC ARREST  . CARDIAC SURGERY    . COLONOSCOPY    . CYSTOSCOPY W/ RETROGRADES Bilateral 06/15/2013   Procedure: CYSTOSCOPY WITH BILATERAL RETROGRADE PYELOGRAM  BLADDER BIOPSY, PROSTATIC URETHRAL BIOPSY, ;  Surgeon: Molli Hazard, MD;  [facility];  Service: Urology;  Laterality: Bilateral;  . ESOPHAGOGASTRODUODENOSCOPY (EGD) WITH PROPOFOL N/A 03/11/2018   Procedure: ESOPHAGOGASTRODUODENOSCOPY (EGD) WITH PROPOFOL;  Surgeon: Clarene Essex, MD;  Location: [place];  Service: Endoscopy;  Laterality: N/A;  . LAPAROSCOPIC CHOLECYSTECTOMY  01-14-2001  . RIGHT/LEFT HEART CATH AND CORONARY ANGIOGRAPHY N/A 06/12/2017   Procedure: RIGHT/LEFT HEART CATH AND CORONARY ANGIOGRAPHY;  Surgeon: Larey Dresser, MD;  Location: [place] [facility];  Service: Cardiovascular;  Laterality: N/A;  . TEE WITHOUT CARDIOVERSION N/A 07/02/2017   Procedure: TRANSESOPHAGEAL ECHOCARDIOGRAM (TEE);  Surgeon: Prescott Gum, Collier Salina, MD;  Location: [place];  Service: Open Heart Surgery;  Laterality: N/A;  . UMBILICAL HERNIA REPAIR  01-14-2008    Family History  Problem Relation Age of Onset  . Aortic aneurysm Mother 53       cause of death  . Other Father        motor vehicle accident  . Heart disease Other        family history    Social History Social History   Tobacco Use  . Smoking status: Former Smoker    Packs/day: 0.50    Years: 27.00    Pack years: 13.50    Types: Cigarettes    Quit date: 10/21/1979    Years since  quitting: 40.9  . Smokeless tobacco: Never Used  Vaping Use  . Vaping Use: Never used  Substance Use Topics  . Alcohol use: Yes    Alcohol/week: 4.0 standard drinks    Types: 4 Cans of beer per week    Comment: drinks "as much as I could" - 2-3 beers up to 8-10  . Drug use: Never    Current Outpatient Medications  Medication Sig Dispense Refill  . acetaminophen (TYLENOL) 500 MG tablet Take 1,000 mg by mouth every 8 (eight) hours as needed for mild pain, fever or headache. Per bottle as needed    . albuterol (VENTOLIN HFA) 108 (90 Base) MCG/ACT inhaler INHALE 2 PUFFS INTO THE LUNGS EVERY 6 HOURS AS NEEDED FOR WHEEZING OR SHORTNESS OF BREATH 18 g 5  . azelastine (ASTELIN) 137 MCG/SPRAY  nasal spray Place 1-2 sprays into the nose 2 (two) times daily as needed for rhinitis or allergies.     . benzonatate (TESSALON) 200 MG capsule TAKE 1 CAPSULE BY MOUTH EVERY 8 HOURS AS NEEDED FOR COUGH 40 capsule 0  . chlorpheniramine (CHLOR-TRIMETON) 4 MG tablet Take 4 mg by mouth as needed for allergies.    . clobetasol ointment (TEMOVATE) 7.16 % Apply 1 application topically daily. 60 g 3  . COLCRYS 0.6 MG tablet Take 0.6 mg by mouth daily.     Marland Kitchen Dextromethorphan-guaiFENesin (MUCINEX DM MAXIMUM STRENGTH) 60-1200 MG TB12 Take 1 tablet by mouth daily.    Marland Kitchen DHEA 50 MG TABS Take 50 mg by mouth daily with breakfast.     . EPINEPHrine 0.3 mg/0.3 mL IJ SOAJ injection Inject 0.3 mLs (0.3 mg total) into the muscle as needed (for angioedema). 2 Device 0  . Erenumab-aooe (AIMOVIG) 140 MG/ML SOAJ Inject 140 mg into the skin every 30 (thirty) days. 1 pen 11  . Ferrous Sulfate (IRON) 142 (45 Fe) MG TBCR Take by mouth in the morning and at bedtime.    . finasteride (PROSCAR) 5 MG tablet Take 5 mg by mouth at bedtime.     Marland Kitchen levothyroxine (SYNTHROID, LEVOTHROID) 112 MCG tablet Take 112 mcg by mouth daily before breakfast.     . liothyronine (CYTOMEL) 25 MCG tablet Take 25 mcg by mouth daily.    Marland Kitchen losartan (COZAAR) 50 MG tablet TAKE 1 TABLET BY MOUTH EVERY DAY 30 tablet 6  . Methylsulfonylmethane (MSM) POWD Take 4 g by mouth daily.    . metoprolol tartrate (LOPRESSOR) 50 MG tablet Take 1 tablet (50 mg total) by mouth 2 (two) times daily. 180 tablet 3  . mupirocin ointment (BACTROBAN) 2 % Apply to open sores qd-bid as needed 22 g 1  . omeprazole (PRILOSEC) 40 MG capsule Take 1 capsule (40 mg total) by mouth 2 (two) times daily. 180 capsule 1  . predniSONE (DELTASONE) 10 MG tablet prednisone 10 mg tablet  take 1 tab three times a day for 2 days, 1 tab twice a day for 5 days, 1 tab daily till finished    . rivaroxaban (XARELTO) 20 MG TABS tablet Take 1 tablet (20 mg total) by mouth daily with supper. 90 tablet 3   . rizatriptan (MAXALT) 10 MG tablet Take 1 tablet (10 mg total) by mouth as needed for migraine. May repeat in 2 hours if needed 10 tablet 11  . SYMBICORT 160-4.5 MCG/ACT inhaler INHALE 2 PUFFS INTO THE LUNGS TWICE DAILY 10.2 g 6  . tamsulosin (FLOMAX) 0.4 MG CAPS capsule Take 0.4 mg by mouth daily after supper.    Marland Kitchen  testosterone cypionate (DEPOTESTOTERONE CYPIONATE) 100 MG/ML injection Inject 100 mg into the muscle every Thursday. For IM use only    . venlafaxine (EFFEXOR-XR) 150 MG 24 hr capsule Take 150 mg by mouth daily with breakfast.    . amoxicillin (AMOXIL) 500 MG capsule Take 4 capsules (2,000 mg total) by mouth See admin instructions. 1 hour prior to dental procedure 8 capsule 5  . testosterone cypionate (DEPOTESTOSTERONE CYPIONATE) 200 MG/ML injection testosterone cypionate 200 mg/mL intramuscular oil  ADMINISTER 1 ML IN THE MUSCLE 1 TIME A WEEK     No current facility-administered medications for this visit.    Allergies  Allergen Reactions  . Perphenazine Other (See Comments)    Tongue swelling  . Amiodarone Swelling  . Codeine Itching  . Tramadol Other (See Comments)    dellusion     Review of Systems   Gained weight in the past 6 months as he contracted RSV and was winded in his activities for few months.  He is now recovered.  He is being evaluated by Dr. Melvyn Novas for chronic cough.  He has had no symptoms of recurrent PAF or bleeding complications from Xarelto. He understands that he can stop his Xarelto for 2 days prior to the dental work planned in early 2022.  BP (!) 155/89   Pulse 62   Temp 98.4 F (36.9 C) (Skin)   Resp 20   Ht 5\' 10"  (1.778 m)   Wt 275 lb (124.7 kg)   SpO2 96% Comment: RA with mask on  BMI 39.46 kg/m  Physical Exam      Exam    General- alert and comfortable    Neck- no JVD, no cervical adenopathy palpable, no carotid bruit   Lungs- clear without rales, wheezes.  Sternal wound stable without click or pop.   Cor- regular rate and  rhythm, no murmur , no gallop   Abdomen- soft, non-tender    Extremities - warm, non-tender, minimal edema   Neuro- oriented, appropriate, no focal weakness   Diagnostic Tests: Chest x-ray performed at the local hospital in [facility] during his episode of RSV is reviewed on disc which shows clear lung fields, stable cardiac silhouette, and mild displacement of some of the sternal wires.  Impression: Stable after biologic Bentall root replacement and replacement of ascending aortic aneurysm 2018.  Continued cardiology follow-up with echocardiogram and blood pressure control.  Plan: Patient understands I will be retiring in the next few weeks and has requested to be followed by Dr. Cyndia Bent for his thoracic aortic disease and appointment will be made late 2022 without CT scan since his CT scan earlier this year looks fine.   Len Childs, MD [facility] [phone]

## 2020-10-10 ENCOUNTER — Encounter (HOSPITAL_COMMUNITY): Payer: Self-pay | Admitting: *Deleted

## 2020-10-10 ENCOUNTER — Other Ambulatory Visit: Payer: Self-pay

## 2020-10-10 ENCOUNTER — Emergency Department (HOSPITAL_COMMUNITY): Payer: Medicare Other

## 2020-10-10 ENCOUNTER — Inpatient Hospital Stay (HOSPITAL_COMMUNITY)
Admission: EM | Admit: 2020-10-10 | Discharge: 2020-10-13 | DRG: 872 | Disposition: A | Discharge status: Home / Self Care | Payer: Medicare Other | Attending: Internal Medicine | Admitting: Internal Medicine

## 2020-10-10 DIAGNOSIS — I480 Paroxysmal atrial fibrillation: Secondary | ICD-10-CM | POA: Diagnosis not present

## 2020-10-10 DIAGNOSIS — J45909 Unspecified asthma, uncomplicated: Secondary | ICD-10-CM | POA: Diagnosis present

## 2020-10-10 DIAGNOSIS — Z9049 Acquired absence of other specified parts of digestive tract: Secondary | ICD-10-CM

## 2020-10-10 DIAGNOSIS — K5792 Diverticulitis of intestine, part unspecified, without perforation or abscess without bleeding: Secondary | ICD-10-CM

## 2020-10-10 DIAGNOSIS — Z7951 Long term (current) use of inhaled steroids: Secondary | ICD-10-CM

## 2020-10-10 DIAGNOSIS — Z87891 Personal history of nicotine dependence: Secondary | ICD-10-CM

## 2020-10-10 DIAGNOSIS — Z7901 Long term (current) use of anticoagulants: Secondary | ICD-10-CM

## 2020-10-10 DIAGNOSIS — E039 Hypothyroidism, unspecified: Secondary | ICD-10-CM | POA: Diagnosis present

## 2020-10-10 DIAGNOSIS — M1990 Unspecified osteoarthritis, unspecified site: Secondary | ICD-10-CM | POA: Diagnosis present

## 2020-10-10 DIAGNOSIS — N400 Enlarged prostate without lower urinary tract symptoms: Secondary | ICD-10-CM | POA: Diagnosis not present

## 2020-10-10 DIAGNOSIS — Z885 Allergy status to narcotic agent status: Secondary | ICD-10-CM

## 2020-10-10 DIAGNOSIS — K219 Gastro-esophageal reflux disease without esophagitis: Secondary | ICD-10-CM | POA: Diagnosis present

## 2020-10-10 DIAGNOSIS — A419 Sepsis, unspecified organism: Secondary | ICD-10-CM | POA: Diagnosis not present

## 2020-10-10 DIAGNOSIS — Z20822 Contact with and (suspected) exposure to covid-19: Secondary | ICD-10-CM | POA: Diagnosis not present

## 2020-10-10 DIAGNOSIS — Z8249 Family history of ischemic heart disease and other diseases of the circulatory system: Secondary | ICD-10-CM | POA: Diagnosis not present

## 2020-10-10 DIAGNOSIS — K6389 Other specified diseases of intestine: Secondary | ICD-10-CM | POA: Diagnosis not present

## 2020-10-10 DIAGNOSIS — D72829 Elevated white blood cell count, unspecified: Secondary | ICD-10-CM | POA: Diagnosis not present

## 2020-10-10 DIAGNOSIS — Z952 Presence of prosthetic heart valve: Secondary | ICD-10-CM

## 2020-10-10 DIAGNOSIS — Z6841 Body Mass Index (BMI) 40.0 and over, adult: Secondary | ICD-10-CM | POA: Diagnosis not present

## 2020-10-10 DIAGNOSIS — R0902 Hypoxemia: Secondary | ICD-10-CM | POA: Diagnosis not present

## 2020-10-10 DIAGNOSIS — F32A Depression, unspecified: Secondary | ICD-10-CM | POA: Diagnosis present

## 2020-10-10 DIAGNOSIS — F419 Anxiety disorder, unspecified: Secondary | ICD-10-CM | POA: Diagnosis present

## 2020-10-10 DIAGNOSIS — R5382 Chronic fatigue, unspecified: Secondary | ICD-10-CM | POA: Diagnosis present

## 2020-10-10 DIAGNOSIS — J42 Unspecified chronic bronchitis: Secondary | ICD-10-CM | POA: Diagnosis not present

## 2020-10-10 DIAGNOSIS — I700 Atherosclerosis of aorta: Secondary | ICD-10-CM | POA: Diagnosis not present

## 2020-10-10 DIAGNOSIS — I10 Essential (primary) hypertension: Secondary | ICD-10-CM | POA: Diagnosis not present

## 2020-10-10 DIAGNOSIS — M797 Fibromyalgia: Secondary | ICD-10-CM | POA: Diagnosis not present

## 2020-10-10 DIAGNOSIS — J90 Pleural effusion, not elsewhere classified: Secondary | ICD-10-CM | POA: Diagnosis not present

## 2020-10-10 DIAGNOSIS — G4733 Obstructive sleep apnea (adult) (pediatric): Secondary | ICD-10-CM | POA: Diagnosis not present

## 2020-10-10 DIAGNOSIS — Z888 Allergy status to other drugs, medicaments and biological substances status: Secondary | ICD-10-CM

## 2020-10-10 DIAGNOSIS — R569 Unspecified convulsions: Secondary | ICD-10-CM

## 2020-10-10 DIAGNOSIS — K5730 Diverticulosis of large intestine without perforation or abscess without bleeding: Secondary | ICD-10-CM | POA: Diagnosis not present

## 2020-10-10 DIAGNOSIS — R6510 Systemic inflammatory response syndrome (SIRS) of non-infectious origin without acute organ dysfunction: Secondary | ICD-10-CM

## 2020-10-10 DIAGNOSIS — Z79890 Hormone replacement therapy (postmenopausal): Secondary | ICD-10-CM | POA: Diagnosis not present

## 2020-10-10 DIAGNOSIS — K5732 Diverticulitis of large intestine without perforation or abscess without bleeding: Secondary | ICD-10-CM | POA: Diagnosis not present

## 2020-10-10 DIAGNOSIS — Z79899 Other long term (current) drug therapy: Secondary | ICD-10-CM

## 2020-10-10 DIAGNOSIS — Z713 Dietary counseling and surveillance: Secondary | ICD-10-CM

## 2020-10-10 DIAGNOSIS — R42 Dizziness and giddiness: Secondary | ICD-10-CM | POA: Diagnosis not present

## 2020-10-10 DIAGNOSIS — R52 Pain, unspecified: Secondary | ICD-10-CM | POA: Diagnosis not present

## 2020-10-10 DIAGNOSIS — R000 Tachycardia, unspecified: Secondary | ICD-10-CM | POA: Diagnosis not present

## 2020-10-10 LAB — COMPREHENSIVE METABOLIC PANEL
ALT: 30 U/L (ref 0–44)
AST: 28 U/L (ref 15–41)
Albumin: 3.6 g/dL (ref 3.5–5.0)
Alkaline Phosphatase: 44 U/L (ref 38–126)
Anion gap: 9 (ref 5–15)
BUN: 20 mg/dL (ref 8–23)
CO2: 26 mmol/L (ref 22–32)
Calcium: 8.4 mg/dL — ABNORMAL LOW (ref 8.9–10.3)
Chloride: 102 mmol/L (ref 98–111)
Creatinine, Ser: 1.25 mg/dL — ABNORMAL HIGH (ref 0.61–1.24)
GFR, Estimated: >60 mL/min (ref >60)
Glucose, Bld: 125 mg/dL — ABNORMAL HIGH (ref 70–99)
Potassium: 3.9 mmol/L (ref 3.5–5.1)
Sodium: 137 mmol/L (ref 135–145)
Total Bilirubin: 1.2 mg/dL (ref 0.3–1.2)
Total Protein: 6.1 g/dL — ABNORMAL LOW (ref 6.5–8.1)

## 2020-10-10 LAB — LACTIC ACID, PLASMA
Lactic Acid, Venous: 2.4 mmol/L (ref 0.5–1.9)
Lactic Acid, Venous: 1.8 mmol/L (ref 0.5–1.9)

## 2020-10-10 LAB — PROTIME-INR
INR: 1.4 — ABNORMAL HIGH (ref 0.8–1.2)
Prothrombin Time: 16.4 seconds — ABNORMAL HIGH (ref 11.4–15.2)

## 2020-10-10 LAB — CBC
HCT: 51.2 % (ref 39.0–52.0)
Hemoglobin: 17.4 g/dL — ABNORMAL HIGH (ref 13.0–17.0)
MCH: 32.9 pg (ref 26.0–34.0)
MCHC: 34 g/dL (ref 30.0–36.0)
MCV: 96.8 fL (ref 80.0–100.0)
Platelets: 127 10*3/uL — ABNORMAL LOW (ref 150–400)
RBC: 5.29 MIL/uL (ref 4.22–5.81)
RDW: 12.9 % (ref 11.5–15.5)
WBC: 16.2 10*3/uL — ABNORMAL HIGH (ref 4.0–10.5)
nRBC: 0 % (ref 0.0–0.2)

## 2020-10-10 LAB — RESP PANEL BY RT-PCR (FLU A&B, COVID) ARPGX2
Influenza A by PCR: NEGATIVE
Influenza B by PCR: NEGATIVE
SARS Coronavirus 2 by RT PCR: NEGATIVE

## 2020-10-10 LAB — LIPASE, BLOOD: Lipase: 37 U/L (ref 11–51)

## 2020-10-10 LAB — TROPONIN I (HIGH SENSITIVITY)
Troponin I (High Sensitivity): 11 ng/L (ref <18)
Troponin I (High Sensitivity): 10 ng/L (ref <18)

## 2020-10-10 LAB — APTT: aPTT: 32 seconds (ref 24–36)

## 2020-10-10 MED ORDER — ONDANSETRON HCL 4 MG/2ML IJ SOLN
4.0000 mg | Freq: Four times a day (QID) | INTRAMUSCULAR | Status: DC | PRN
  Administered 2020-10-11: 4 mg via INTRAVENOUS
  Filled 2020-10-10: qty 2

## 2020-10-10 MED ORDER — ONDANSETRON HCL 4 MG PO TABS: 4.0000 mg | ORAL_TABLET | Freq: Four times a day (QID) | ORAL | Status: DC | PRN

## 2020-10-10 MED ORDER — LOSARTAN POTASSIUM 50 MG PO TABS
50.0000 mg | ORAL_TABLET | Freq: Every day | ORAL | Status: DC
  Administered 2020-10-11 – 2020-10-13 (×3): 50 mg via ORAL
  Filled 2020-10-10 (×3): qty 1

## 2020-10-10 MED ORDER — ALBUTEROL SULFATE HFA 108 (90 BASE) MCG/ACT IN AERS: 2.0000 | INHALATION_SPRAY | Freq: Four times a day (QID) | RESPIRATORY_TRACT | Status: DC | PRN

## 2020-10-10 MED ORDER — ENOXAPARIN SODIUM 40 MG/0.4ML ~~LOC~~ SOLN
40.0000 mg | Freq: Every day | SUBCUTANEOUS | Status: DC
  Administered 2020-10-10: 40 mg via SUBCUTANEOUS
  Filled 2020-10-10: qty 0.4

## 2020-10-10 MED ORDER — ACETAMINOPHEN 325 MG PO TABS
650.0000 mg | ORAL_TABLET | Freq: Once | ORAL | Status: AC
  Administered 2020-10-10: 650 mg via ORAL
  Filled 2020-10-10: qty 2

## 2020-10-10 MED ORDER — LEVOTHYROXINE SODIUM 112 MCG PO TABS
112.0000 ug | ORAL_TABLET | Freq: Every day | ORAL | Status: DC
  Administered 2020-10-11 – 2020-10-13 (×3): 112 ug via ORAL
  Filled 2020-10-10 (×3): qty 1

## 2020-10-10 MED ORDER — FENTANYL CITRATE (PF) 100 MCG/2ML IJ SOLN
100.0000 ug | Freq: Once | INTRAMUSCULAR | Status: AC
  Administered 2020-10-10: 100 ug via INTRAVENOUS
  Filled 2020-10-10: qty 2

## 2020-10-10 MED ORDER — PIPERACILLIN-TAZOBACTAM 3.375 G IVPB
3.3750 g | Freq: Once | INTRAVENOUS | Status: AC
Start: 2020-10-10 — End: 2020-10-10
  Administered 2020-10-10: 3.375 g via INTRAVENOUS
  Filled 2020-10-10: qty 50

## 2020-10-10 MED ORDER — LIOTHYRONINE SODIUM 25 MCG PO TABS
25.0000 ug | ORAL_TABLET | Freq: Every day | ORAL | Status: DC
  Administered 2020-10-11 – 2020-10-13 (×3): 25 ug via ORAL
  Filled 2020-10-10 (×3): qty 1

## 2020-10-10 MED ORDER — METRONIDAZOLE IN NACL 5-0.79 MG/ML-% IV SOLN
500.0000 mg | Freq: Three times a day (TID) | INTRAVENOUS | Status: DC
  Administered 2020-10-10: 500 mg via INTRAVENOUS
  Filled 2020-10-10 (×2): qty 100

## 2020-10-10 MED ORDER — SODIUM CHLORIDE 0.9 % IV SOLN
2.0000 g | Freq: Every day | INTRAVENOUS | Status: DC
  Administered 2020-10-10 – 2020-10-12 (×3): 2 g via INTRAVENOUS
  Filled 2020-10-10 (×3): qty 20

## 2020-10-10 MED ORDER — COLCHICINE 0.6 MG PO TABS
0.6000 mg | ORAL_TABLET | Freq: Every day | ORAL | Status: DC
  Administered 2020-10-11 – 2020-10-13 (×3): 0.6 mg via ORAL
  Filled 2020-10-10 (×3): qty 1

## 2020-10-10 MED ORDER — VANCOMYCIN HCL IN DEXTROSE 1-5 GM/200ML-% IV SOLN: 1000.0000 mg | Freq: Once | INTRAVENOUS | Status: DC

## 2020-10-10 MED ORDER — METOPROLOL TARTRATE 50 MG PO TABS
50.0000 mg | ORAL_TABLET | Freq: Two times a day (BID) | ORAL | Status: DC
  Administered 2020-10-11 – 2020-10-13 (×6): 50 mg via ORAL
  Filled 2020-10-10 (×6): qty 1

## 2020-10-10 MED ORDER — SODIUM CHLORIDE 0.9 % IV SOLN: 2.0000 g | Freq: Once | INTRAVENOUS | Status: DC

## 2020-10-10 MED ORDER — FENTANYL CITRATE (PF) 100 MCG/2ML IJ SOLN
50.0000 ug | Freq: Once | INTRAMUSCULAR | Status: DC
Start: 2020-10-10 — End: 2020-10-10

## 2020-10-10 MED ORDER — LACTATED RINGERS IV BOLUS (SEPSIS)
1000.0000 mL | Freq: Once | INTRAVENOUS | Status: AC
  Administered 2020-10-10: 1000 mL via INTRAVENOUS

## 2020-10-10 MED ORDER — LACTATED RINGERS IV SOLN: INTRAVENOUS | Status: DC

## 2020-10-10 MED ORDER — RIVAROXABAN 20 MG PO TABS
20.0000 mg | ORAL_TABLET | Freq: Every day | ORAL | Status: DC
  Administered 2020-10-11 – 2020-10-12 (×2): 20 mg via ORAL
  Filled 2020-10-10 (×2): qty 1

## 2020-10-10 MED ORDER — LACTATED RINGERS IV SOLN
INTRAVENOUS | Status: AC
Start: 2020-10-10 — End: 2020-10-11

## 2020-10-10 MED ORDER — VANCOMYCIN HCL 10 G IV SOLR
2500.0000 mg | Freq: Once | INTRAVENOUS | Status: AC
  Administered 2020-10-10: 2500 mg via INTRAVENOUS
  Filled 2020-10-10: qty 2000

## 2020-10-10 MED ORDER — KETOROLAC TROMETHAMINE 30 MG/ML IJ SOLN
30.0000 mg | Freq: Four times a day (QID) | INTRAMUSCULAR | Status: DC | PRN
  Administered 2020-10-10 – 2020-10-11 (×2): 30 mg via INTRAVENOUS
  Filled 2020-10-10 (×2): qty 1

## 2020-10-10 MED ORDER — FENTANYL CITRATE (PF) 100 MCG/2ML IJ SOLN
50.0000 ug | Freq: Once | INTRAMUSCULAR | Status: AC
  Administered 2020-10-10: 50 ug via INTRAVENOUS
  Filled 2020-10-10: qty 2

## 2020-10-10 MED ORDER — SODIUM CHLORIDE 0.9 % IV SOLN: 3.0000 g | Freq: Four times a day (QID) | INTRAVENOUS | Status: DC

## 2020-10-10 MED ORDER — IOHEXOL 350 MG/ML SOLN
100.0000 mL | Freq: Once | INTRAVENOUS | Status: AC | PRN
  Administered 2020-10-10: 100 mL via INTRAVENOUS

## 2020-10-10 MED ORDER — LACTATED RINGERS IV BOLUS (SEPSIS)
200.0000 mL | Freq: Once | INTRAVENOUS | Status: AC
Start: 2020-10-10 — End: 2020-10-10
  Administered 2020-10-10: 200 mL via INTRAVENOUS

## 2020-10-10 MED ORDER — PANTOPRAZOLE SODIUM 40 MG PO TBEC
40.0000 mg | DELAYED_RELEASE_TABLET | Freq: Every day | ORAL | Status: DC
  Administered 2020-10-11 – 2020-10-13 (×3): 40 mg via ORAL
  Filled 2020-10-10 (×3): qty 1

## 2020-10-10 NOTE — ED Notes (Signed)
Called CT and asked if they would do patient next per PA

## 2020-10-10 NOTE — Sepsis Progress Note (Signed)
Sepsis monitoring complete 

## 2020-10-10 NOTE — H&P (Signed)
History and Physical   ZURICH ARRON [id number] DOB: 1951/06/28 DOA: 10/10/2020  Referring MD/NP/PA: Dr. Vallery Ridge  PCP: Shirline Frees, MD   Outpatient Specialists: None  Patient coming from: Home  Chief Complaint: Abdominal pain nausea vomiting  HPI: Grant [name] is a 69 y.o. male with medical history significant of recurrent diverticulitis, asthma, depression, status post aortic valve replacement in 2018, fibromyalgia, GERD, chronic migraines, obstructive sleep apnea, hypothyroidism and paroxysmal atrial fibrillation who presented to the ER with abdominal pain with nausea and an episode of vomiting. He has had some dizziness also. Came in and was found to have left lower quadrant abdominal pain with tenderness. Patient denied excessive intake of nuts or seeds. He denied any hematemesis or melena. Denied any change in diet or medications. He is being admitted with sepsis secondary to sigmoid diverticulitis..  ED Course: Temperature 100.1, blood pressure 130/114, pulse 129 respirate 20 oxygen sats 90% room air. White count 16.2 hemoglobin 17.4 and platelets 127. Glucose is 125. Creatinine 1.25 BUN 20. Lactic acid 2.4. INR 1.4. CT abdomen pelvis showed sigmoid diverticulitis. Patient being admitted with sepsis secondary to sigmoid diverticulitis.  Review of Systems: As per HPI otherwise 10 point review of systems negative.    Past Medical History:  Diagnosis Date  . Allergic rhinitis   . Anxiety   . Arthritis   . Asthma   . Bladder tumor   . Chronic fatigue   . Depression    06/30/17 Pt denies being depressed, reports Effexor is taken for Chronic Fatigue   . Dyspnea   . Enlarged prostate   . Fibromyalgia   . GERD (gastroesophageal reflux disease)   . Headache   . History of chronic bronchitis   . History of migraine   . History of toxic encephalopathy   . Hypothyroidism   . OSA on CPAP    CPAP 14  . PAF (paroxysmal atrial fibrillation) ([place]) CARDIOLOGIST -- DR  [name]   DX OCT 2013  . 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 Unspecified essential hypertension   . Urethral tumor    PROSTATIC    Past Surgical History:  Procedure Laterality Date  . BENTALL PROCEDURE N/A 07/02/2017   Procedure: BENTALL PROCEDURE;  Surgeon: Ivin Poot, MD;  Location: [facility];  Service: Open Heart Surgery;  Laterality: N/A;  WITH CIRC ARREST  . CARDIAC SURGERY    . COLONOSCOPY    . CYSTOSCOPY W/ RETROGRADES Bilateral 06/15/2013   Procedure: CYSTOSCOPY WITH BILATERAL RETROGRADE PYELOGRAM  BLADDER BIOPSY, PROSTATIC URETHRAL BIOPSY, ;  Surgeon: Molli Hazard, MD;  Location[facility];  Service: Urology;  Laterality: Bilateral;  . ESOPHAGOGASTRODUODENOSCOPY (EGD) WITH PROPOFOL N/A 03/11/2018   Procedure: ESOPHAGOGASTRODUODENOSCOPY (EGD) WITH PROPOFOL;  Surgeon: Clarene Essex, MD;  Location: [place];  Service: Endoscopy;  Laterality: N/A;  . LAPAROSCOPIC CHOLECYSTECTOMY  01-14-2001  . RIGHT/LEFT HEART CATH AND CORONARY ANGIOGRAPHY N/A 06/12/2017   Procedure: RIGHT/LEFT HEART CATH AND CORONARY ANGIOGRAPHY;  Surgeon: Larey Dresser, MD;  Location: [facility];  Service: Cardiovascular;  Laterality: N/A;  . TEE WITHOUT CARDIOVERSION N/A 07/02/2017   Procedure: TRANSESOPHAGEAL ECHOCARDIOGRAM (TEE);  Surgeon: Prescott Gum, Collier Salina, MD;  Location: [facility];  Service: Open Heart Surgery;  Laterality: N/A;  . UMBILICAL HERNIA REPAIR  01-14-2008     reports that he quit smoking about 41 years ago. His smoking use included cigarettes. He has a 13.50 pack-year smoking history. He has never used smokeless tobacco. He reports current alcohol  use of about 4.0 standard drinks of alcohol per week. He reports that he does not use drugs.  Allergies  Allergen Reactions  . Perphenazine Other (See Comments)    Tongue swelling  . Amiodarone Swelling  . Codeine Itching  . Tramadol Other (See Comments)    dellusion     Family History  Problem Relation Age of Onset  .  Aortic aneurysm Mother 2       cause of death  . Other Father        motor vehicle accident  . Heart disease Other        family history     Prior to Admission medications   Medication Sig Start Date End Date Taking? Authorizing Provider  acetaminophen (TYLENOL) 500 MG tablet Take 1,000 mg by mouth every 8 (eight) hours as needed for mild pain, fever or headache. Per bottle as needed    [provider]  albuterol (VENTOLIN HFA) 108 (90 Base) MCG/ACT inhaler INHALE 2 PUFFS INTO THE LUNGS EVERY 6 HOURS AS NEEDED FOR WHEEZING OR SHORTNESS OF BREATH 12/22/19   Tanda Rockers, MD  amoxicillin (AMOXIL) 500 MG capsule Take 4 capsules (2,000 mg total) by mouth See admin instructions. 1 hour prior to dental procedure 10/03/20   Prescott Gum, Collier Salina, MD  azelastine (ASTELIN) 137 MCG/SPRAY nasal spray Place 1-2 sprays into the nose 2 (two) times daily as needed for rhinitis or allergies.     [provider]  benzonatate (TESSALON) 200 MG capsule TAKE 1 CAPSULE BY MOUTH EVERY 8 HOURS AS NEEDED FOR COUGH 07/05/20   Tanda Rockers, MD  chlorpheniramine (CHLOR-TRIMETON) 4 MG tablet Take 4 mg by mouth as needed for allergies.    [provider]  clobetasol ointment (TEMOVATE) [id number] % Apply 1 application topically daily. 02/07/20   Lavonna Monarch, MD  COLCRYS 0.6 MG tablet Take 0.6 mg by mouth daily.  01/29/12   [provider]  Dextromethorphan-guaiFENesin (MUCINEX DM MAXIMUM STRENGTH) 60-1200 MG TB12 Take 1 tablet by mouth daily.    [provider]  DHEA 50 MG TABS Take 50 mg by mouth daily with breakfast.     [provider]  EPINEPHrine 0.3 mg/0.3 mL IJ SOAJ injection Inject 0.3 mLs (0.3 mg total) into the muscle as needed (for angioedema). 02/26/18   Josue Hector, MD  Erenumab-aooe (AIMOVIG) 140 MG/ML SOAJ Inject 140 mg into the skin every 30 (thirty) days. 04/13/20   Cameron Sprang, MD  Ferrous Sulfate (IRON) 142 (45 Fe) MG TBCR Take by mouth in the  morning and at bedtime.    [provider]  finasteride (PROSCAR) 5 MG tablet Take 5 mg by mouth at bedtime.  01/14/14   [provider]  levothyroxine (SYNTHROID, LEVOTHROID) 112 MCG tablet Take 112 mcg by mouth daily before breakfast.     [provider]  liothyronine (CYTOMEL) 25 MCG tablet Take 25 mcg by mouth daily.    [provider]  losartan (COZAAR) 50 MG tablet TAKE 1 TABLET BY MOUTH EVERY DAY 10/01/20   Prescott Gum, Collier Salina, MD  Methylsulfonylmethane (MSM) POWD Take 4 g by mouth daily.    [provider]  metoprolol tartrate (LOPRESSOR) 50 MG tablet Take 1 tablet (50 mg total) by mouth 2 (two) times daily. 03/28/20   Josue Hector, MD  mupirocin ointment (BACTROBAN) 2 % Apply to open sores qd-bid as needed 04/03/20   Lavonna Monarch, MD  omeprazole (PRILOSEC) 40 MG capsule Take 1  capsule (40 mg total) by mouth 2 (two) times daily. 10/31/16   Josue Hector, MD  predniSONE (DELTASONE) 10 MG tablet prednisone 10 mg tablet  take 1 tab three times a day for 2 days, 1 tab twice a day for 5 days, 1 tab daily till finished    [provider]  rivaroxaban (XARELTO) 20 MG TABS tablet Take 1 tablet (20 mg total) by mouth daily with supper. 03/28/20   Josue Hector, MD  rizatriptan (MAXALT) 10 MG tablet Take 1 tablet (10 mg total) by mouth as needed for migraine. May repeat in 2 hours if needed 04/13/20   Cameron Sprang, MD  SYMBICORT 160-4.5 MCG/ACT inhaler INHALE 2 PUFFS INTO THE LUNGS TWICE DAILY 07/31/20   Tanda Rockers, MD  tamsulosin (FLOMAX) 0.4 MG CAPS capsule Take 0.4 mg by mouth daily after supper.    [provider]  testosterone cypionate (DEPOTESTOSTERONE CYPIONATE) 200 MG/ML injection testosterone cypionate 200 mg/mL intramuscular oil  ADMINISTER 1 ML IN THE MUSCLE 1 TIME A WEEK    [provider]  testosterone cypionate (DEPOTESTOTERONE CYPIONATE) 100 MG/ML injection Inject 100 mg into the muscle every Thursday. For IM  use only    [provider]  venlafaxine (EFFEXOR-XR) 150 MG 24 hr capsule Take 150 mg by mouth daily with breakfast.    [provider]    Physical Exam: Vitals:   10/10/20 1630 10/10/20 1720 10/10/20 1820 10/10/20 1840  BP: 129/77 112/73 116/75 116/86  Pulse: (!) 106 (!) 110 (!) 103 (!) 110  Resp: 20 20 20 17   Temp:      SpO2: 95% 95% 94% 92%  Weight:      Height:          Constitutional: Morbidly obese, no distress Vitals:   10/10/20 1630 10/10/20 1720 10/10/20 1820 10/10/20 1840  BP: 129/77 112/73 116/75 116/86  Pulse: (!) 106 (!) 110 (!) 103 (!) 110  Resp: 20 20 20 17   Temp:      SpO2: 95% 95% 94% 92%  Weight:      Height:       Eyes: PERRL, lids and conjunctivae normal ENMT: Mucous membranes are dry posterior pharynx clear of any exudate or lesions.Normal dentition.  Neck: normal, supple, no masses, no thyromegaly Respiratory: clear to auscultation bilaterally, no wheezing, no crackles. Normal respiratory effort. No accessory muscle use.  Cardiovascular: Regular rate and rhythm, no murmurs / rubs / gallops. No extremity edema. 2+ pedal pulses. No carotid bruits.  Abdomen: Mild left lower quadrant abdominal tenderness, no masses palpated. No hepatosplenomegaly. Bowel sounds positive.  Musculoskeletal: no clubbing / cyanosis. No joint deformity upper and lower extremities. Good ROM, no contractures. Normal muscle tone.  Skin: no rashes, lesions, ulcers. No induration Neurologic: CN 2-12 grossly intact. Sensation intact, DTR normal. Strength 5/5 in all 4.  Psychiatric: Normal judgment and insight. Alert and oriented x 3. Normal mood.     Labs on Admission: I have personally reviewed following labs and imaging studies  CBC: Recent Labs  Lab 10/10/20 1446  WBC 16.2*  HGB 17.4*  HCT 51.2  MCV 96.8  PLT [id number]*   Basic Metabolic Panel: Recent Labs  Lab 10/10/20 1446  NA 137  K 3.9  CL 102  CO2 26  GLUCOSE 125*  BUN 20  CREATININE 1.25*   CALCIUM 8.4*   GFR: Estimated Creatinine Clearance: 73.7 mL/min (A) (by C-G formula based on SCr of 1.25 mg/dL (H)). Liver Function Tests: Recent  Labs  Lab 10/10/20 1446  AST 28  ALT 30  ALKPHOS 44  BILITOT 1.2  PROT 6.1*  ALBUMIN 3.6   Recent Labs  Lab 10/10/20 1446  LIPASE 37   No results for input(s): AMMONIA in the last 168 hours. Coagulation Profile: Recent Labs  Lab 10/10/20 1446  INR 1.4*   Cardiac Enzymes: No results for input(s): CKTOTAL, CKMB, CKMBINDEX, TROPONINI in the last 168 hours. BNP (last 3 results) No results for input(s): PROBNP in the last 8760 hours. HbA1C: No results for input(s): HGBA1C in the last 72 hours. CBG: No results for input(s): GLUCAP in the last 168 hours. Lipid Profile: No results for input(s): CHOL, HDL, LDLCALC, TRIG, CHOLHDL, LDLDIRECT in the last 72 hours. Thyroid Function Tests: No results for input(s): TSH, T4TOTAL, FREET4, T3FREE, THYROIDAB in the last 72 hours. Anemia Panel: No results for input(s): VITAMINB12, FOLATE, FERRITIN, TIBC, IRON, RETICCTPCT in the last 72 hours. Urine analysis:    Component Value Date/Time   COLORURINE YELLOW 02/16/2019 1720   APPEARANCEUR CLEAR 02/16/2019 1720   LABSPEC 1.010 02/16/2019 1720   PHURINE 5.0 02/16/2019 1720   GLUCOSEU NEGATIVE 02/16/2019 1720   HGBUR NEGATIVE 02/16/2019 1720   BILIRUBINUR NEGATIVE 02/16/2019 1720   KETONESUR NEGATIVE 02/16/2019 1720   PROTEINUR NEGATIVE 02/16/2019 1720   UROBILINOGEN 0.2 01/11/2008 1105   NITRITE NEGATIVE 02/16/2019 1720   LEUKOCYTESUR NEGATIVE 02/16/2019 1720   Sepsis Labs: @LABRCNTIP (procalcitonin:4,lacticidven:4) ) Recent Results (from the past 240 hour(s))  Resp Panel by RT-PCR (Flu A&B, Covid) Nasopharyngeal Swab     Status: None   Collection Time: 10/10/20  2:50 PM   Specimen: Nasopharyngeal Swab; Nasopharyngeal(NP) swabs in vial transport medium  Result Value Ref Range Status   SARS Coronavirus 2 by RT PCR NEGATIVE NEGATIVE  Final    Comment: (NOTE) SARS-CoV-2 target nucleic acids are NOT DETECTED.  The SARS-CoV-2 RNA is generally detectable in upper respiratory specimens during the acute phase of infection. The lowest concentration of SARS-CoV-2 viral copies this assay can detect is 138 copies/mL. A negative result does not preclude SARS-Cov-2 infection and should not be used as the sole basis for treatment or other patient management decisions. A negative result may occur with  improper specimen collection/handling, submission of specimen other than nasopharyngeal swab, presence of viral mutation(s) within the areas targeted by this assay, and inadequate number of viral copies(<138 copies/mL). A negative result must be combined with clinical observations, patient history, and epidemiological information. The expected result is Negative.  Fact Sheet for Patients:  EntrepreneurPulse.com.au  Fact Sheet for Healthcare Providers:  IncredibleEmployment.be  This test is no t yet approved or cleared by the [place] FDA and  has been authorized for detection and/or diagnosis of SARS-CoV-2 by FDA under an Emergency Use Authorization (EUA). This EUA will remain  in effect (meaning this test can be used) for the duration of the COVID-19 declaration under Section 564(b)(1) of the Act, 21 U.S.C.section 360bbb-3(b)(1), unless the authorization is terminated  or revoked sooner.       Influenza A by PCR NEGATIVE NEGATIVE Final   Influenza B by PCR NEGATIVE NEGATIVE Final    Comment: (NOTE) The Xpert Xpress SARS-CoV-2/FLU/RSV plus assay is intended as an aid in the diagnosis of influenza from Nasopharyngeal swab specimens and should not be used as a sole basis for treatment. Nasal washings and aspirates are unacceptable for Xpert Xpress SARS-CoV-2/FLU/RSV testing.  Fact Sheet for Patients: EntrepreneurPulse.com.au  Fact Sheet for Healthcare  Providers: IncredibleEmployment.be  This  test is not yet approved or cleared by the [place] and has been authorized for detection and/or diagnosis of SARS-CoV-2 by FDA under an Emergency Use Authorization (EUA). This EUA will remain in effect (meaning this test can be used) for the duration of the COVID-19 declaration under Section 564(b)(1) of the Act, 21 U.S.C. section 360bbb-3(b)(1), unless the authorization is terminated or revoked.  Performed at [facility], [place] [address] [place], [place] [address]      Radiological Exams on Admission: DG Chest Port 1 View  Result Date: 10/10/2020 CLINICAL DATA:  Sepsis EXAM: PORTABLE CHEST 1 VIEW COMPARISON:  02/08/2020 FINDINGS: Prior median sternotomy and cardiac valve replacement. Stable cardiomediastinal contours. No focal airspace consolidation, pleural effusion, or pneumothorax. IMPRESSION: No acute cardiopulmonary findings. Electronically Signed   By: Davina Poke D.O.   On: 10/10/2020 14:52   CT Angio Chest/Abd/Pel for Dissection W and/or W/WO  Result Date: 10/10/2020 CLINICAL DATA:  Thoracic aortic aneurysm suspected. Left-sided flank pain. EXAM: CT ANGIOGRAPHY CHEST, ABDOMEN AND PELVIS TECHNIQUE: Non-contrast CT of the chest was initially obtained. Multidetector CT imaging through the chest, abdomen and pelvis was performed using the standard protocol during bolus administration of intravenous contrast. Multiplanar reconstructed images and MIPs were obtained and reviewed to evaluate the vascular anatomy. CONTRAST:  164mL OMNIPAQUE IOHEXOL 350 MG/ML SOLN COMPARISON:  Mar 08, 2018. FINDINGS: CTA CHEST FINDINGS Cardiovascular: There are postsurgical changes of the thorax related to prior aortic valve replacement. The heart size is relatively normal. There is no evidence for thoracic aortic dissection or aneurysm. There is no significant pericardial effusion. Minimal atherosclerotic changes  are noted of the thoracic aorta. There is no large centrally located pulmonary embolism. Mediastinum/Nodes: -- No mediastinal lymphadenopathy. -- No hilar lymphadenopathy. -- No axillary lymphadenopathy. -- No supraclavicular lymphadenopathy. -- Normal thyroid gland where visualized. -  Unremarkable esophagus. Lungs/Pleura: Airways are patent. No pleural effusion, lobar consolidation, pneumothorax or pulmonary infarction. Musculoskeletal: No chest wall abnormality. No bony spinal canal stenosis. Review of the MIP images confirms the above findings. CTA ABDOMEN AND PELVIS FINDINGS VASCULAR Aorta: Normal caliber aorta without aneurysm, dissection, vasculitis or significant stenosis. Celiac: Patent without evidence of aneurysm, dissection, vasculitis or significant stenosis. SMA: Patent without evidence of aneurysm, dissection, vasculitis or significant stenosis. Renals: Both renal arteries are patent without evidence of aneurysm, dissection, vasculitis, fibromuscular dysplasia or significant stenosis. IMA: Patent without evidence of aneurysm, dissection, vasculitis or significant stenosis. Inflow: Patent without evidence of aneurysm, dissection, vasculitis or significant stenosis. Veins: No obvious venous abnormality within the limitations of this arterial phase study. Review of the MIP images confirms the above findings. NON-VASCULAR Hepatobiliary: The liver is normal. Status post cholecystectomy.There is no biliary ductal dilation. Pancreas: Normal contours without ductal dilatation. No peripancreatic fluid collection. Spleen: The spleen is borderline enlarged measuring approximately 13 cm craniocaudad. Adrenals/Urinary Tract: --Adrenal glands: Unremarkable. --Right kidney/ureter: No hydronephrosis or radiopaque kidney stones. --Left kidney/ureter: No hydronephrosis or radiopaque kidney stones. --Urinary bladder: Unremarkable. Stomach/Bowel: --Stomach/Duodenum: No hiatal hernia or other gastric abnormality. Normal  duodenal course and caliber. --Small bowel: Unremarkable. --Colon: There is diffuse sigmoid diverticulosis. There is wall thickening of the sigmoid colon with adjacent mild inflammatory changes suggestive of acute uncomplicated diverticulitis. --Appendix: Normal. Vascular/Lymphatic: Atherosclerotic calcification is present within the non-aneurysmal abdominal aorta, without hemodynamically significant stenosis. --No retroperitoneal lymphadenopathy. --No mesenteric lymphadenopathy. --No pelvic or inguinal lymphadenopathy. Reproductive: The prostate gland is enlarged. Other: No ascites or free air. The abdominal wall is normal. Musculoskeletal. No  acute displaced fractures. Review of the MIP images confirms the above findings. IMPRESSION: 1. There is no evidence for thoracic or abdominal aortic dissection or aneurysm. 2. Diffuse sigmoid diverticulosis with wall thickening of the sigmoid colon with adjacent mild inflammatory changes suggestive of acute uncomplicated diverticulitis. As an underlying mass cannot be excluded, follow-up with an outpatient colonoscopy is recommended. 3. Borderline splenomegaly. 4. Enlarged prostate gland. Aortic Atherosclerosis (ICD10-I70.0). Electronically Signed   By: Constance Holster M.D.   On: 10/10/2020 17:12      Assessment/Plan Principal Problem:   Sepsis ([place]) Active Problems:   Obstructive sleep apnea   Essential hypertension   GERD (gastroesophageal reflux disease)   Chronic anticoagulation   Asthma   Hypothyroidism   Acute diverticulitis     #1 sepsis: Patient meets sepsis criteria with leukocytosis tachycardia. He has low-grade temperature as well. This is secondary to intra-abdominal infection due to sigmoid diverticulitis. We'll admit the patient. Initiate IV antibiotics. IV fluids and pain management. Clear liquid diet for now. If no significant improvement may get GI consulted.  #2 acute diverticulitis: Continue pain management. Also antibiotics.  Monitor as per above  #3 essential hypertension: Continue with blood pressure control.  #4 hypothyroidism: Continue levothyroxine.  #5 paroxysmal atrial fibrillation: Patient status post aortic valve replacement. On chronic anticoagulation with Xarelto. Will continue. Also continue metoprolol for rate control.  #6 GERD: Continue with PPIs  #7 obstructive sleep apnea: CPAP at night  #8 morbid obesity: Dietary counseling  DVT prophylaxis: Xarelto Code Status: Full code Family Communication: Wife at bedside Disposition Plan: Home Consults called: None Admission status: Inpatient  Severity of Illness: The appropriate patient status for this patient is INPATIENT. Inpatient status is judged to be reasonable and necessary in order to provide the required intensity of service to ensure the patient's safety. The patient's presenting symptoms, physical exam findings, and initial radiographic and laboratory data in the context of their chronic comorbidities is felt to place them at high risk for further clinical deterioration. Furthermore, it is not anticipated that the patient will be medically stable for discharge from the hospital within 2 midnights of admission. The following factors support the patient status of inpatient.   " The patient's presenting symptoms include abdominal pain and fever. " The worrisome physical exam findings include tenderness. " The initial radiographic and laboratory data are worrisome because of evidence of diverticulitis. " The chronic co-morbidities include paroxysmal atrial fibrillation.   * I certify that at the point of admission it is my clinical judgment that the patient will require inpatient hospital care spanning beyond 2 midnights from the point of admission due to high intensity of service, high risk for further deterioration and high frequency of surveillance required.Barbette Merino MD [facility] Pager [phone]  If 7PM-7AM, please  contact night-coverage [email or web address] Password [facility]  10/10/2020, 7:12 PM

## 2020-10-10 NOTE — ED Provider Notes (Signed)
Medical screening examination/treatment/procedure(s) were conducted as a shared visit with non-physician practitioner(s) and myself.  I personally evaluated the patient during the encounter.  EKG Interpretation  Date/Time:  Wednesday October 10 2020 14:22:55 EST Ventricular Rate:  116 PR Interval:    QRS Duration: 117 QT Interval:  334 QTC Calculation: 464 R Axis:   -61 Text Interpretation: Sinus tachycardia Left anterior fascicular block Probable left ventricular hypertrophy QRS morphology similar to previous. rate increase compared to previous. no acute ischemic appearance Confirmed by Charlesetta Shanks [phone]) on 10/11/2020 9:58:51 AM  Patient has a known history of diverticulitis as well as aortic thoracic dissection.  Patient reports that he had an episode where he just felt severe generalized aching pain over his upper body and extremities as well as generalized weakness.  He attributed this to possible "diverticulitis".  Reports this felt very similar to when he had an episode of diverticulitis although, when the patient described to me he did not describe abdominal pain as a primary complaint.  That seem to come secondarily.  Reports he was having some significant chills but he has gotten control over that.  Patient is alert.  Mental status clear.  Mild tachypnea.  He is ill in appearance.  Airway clear.  Tachycardia, distal pulses are intact.  Patient appears slightly tachypneic but lungs are grossly clear.  Abdomen mildly distended.  Soft without guarding.  Endorses pain in the central and left lower quadrant.  At this time differential diagnosis includes infectious etiology, diverticulitis, possible vascular etiology with prior history of dissection although appears less likely.  Agree with diagnostic evaluation and work-up.   Charlesetta Shanks, MD 10/11/20 1005

## 2020-10-10 NOTE — ED Provider Notes (Signed)
[facility] DEPT Provider Note   CSN: [id number] Arrival date & time: 10/10/20  1355    History Chief Complaint  Patient presents with  . Abdominal Pain    Grant [name] is a 69 y.o. male with history significant for diverticulitis, thoracic aortic dissection, chronic fatigue, enlarged prostate who presents for evaluation of left-sided abdominal pain.  Patient states he began having left-sided sharp, abdominal pain which radiated to his back at approximately 11 AM.  Patient states he felt warm as if he had a fever however did not take temperature.  States he has had some cramping all the way up into his back however does not radiate to his chest.  No shortness of breath.  No headache, lightheadedness.  Does get occasionally dizzy when he gets "severe pain."  No dysuria, diarrhea, constipation.  Denies additional aggravating or alleviating factors.  Pain rated a 10/10.  Vaccinated against [place].  No cough, shortness of breath  Present in room.  Patient with normal mentation.  History obtained from patient, wife in room and past medical records.  No interpreter used  HPI     Past Medical History:  Diagnosis Date  . Allergic rhinitis   . Anxiety   . Arthritis   . Asthma   . Bladder tumor   . Chronic fatigue   . Depression    06/30/17 Pt denies being depressed, reports Effexor is taken for Chronic Fatigue   . Dyspnea   . Enlarged prostate   . Fibromyalgia   . GERD (gastroesophageal reflux disease)   . Headache   . History of chronic bronchitis   . History of migraine   . History of toxic encephalopathy   . Hypothyroidism   . OSA on CPAP    CPAP 14  . PAF (paroxysmal atrial fibrillation) ([place]) CARDIOLOGIST -- DR [name]   DX OCT 2013  . 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 Unspecified essential hypertension   . Urethral tumor    PROSTATIC    Patient Active Problem List   Diagnosis Date Noted  . Acute diverticulitis 10/10/2020  . Bilateral  shoulder pain 10/26/2019  . Morbid obesity due to excess calories ([place]) complicated by HBP, OSA/ gerd  07/07/2019  . Upper airway cough syndrome 03/09/2019  . Confusion with non-focal neuro exam 02/16/2019  . Cough variant asthma vs UACS 11/15/2018  . Cough 10/27/2018  . Angioedema 03/07/2018  . Sepsis ([place]) 03/07/2018  . Asthma 03/07/2018  . Hypothyroidism 03/07/2018  . OSA on CPAP 03/07/2018  . BPH (benign prostatic hyperplasia) 03/07/2018  . Pain in joint of left shoulder 01/26/2018  . Allergic reaction 01/21/2018  . Atrial fibrillation with RVR ([place]) 07/28/2017  . S/P AVR (aortic valve replacement) and aortoplasty 07/02/2017  . Eczema 01/01/2014  . Hoarseness or changing voice 07/24/2013  . PAF (paroxysmal atrial fibrillation) ([place]) 05/18/2013  . Chronic anticoagulation 05/18/2013  . Preop cardiovascular exam 05/18/2013  . Macular rash 08/10/2012  . Acute asthmatic bronchitis 05/05/2012  . Essential hypertension 06/17/2010  . SYNCOPE 06/14/2010  . DEPRESSION 02/11/2008  . Obstructive sleep apnea 02/11/2008  . Seasonal and perennial allergic rhinitis 02/11/2008  . Chronic bronchitis ([place]) 02/11/2008  . GERD (gastroesophageal reflux disease) 02/11/2008    Past Surgical History:  Procedure Laterality Date  . BENTALL PROCEDURE N/A 07/02/2017   Procedure: BENTALL PROCEDURE;  Surgeon: Ivin Poot, MD;  Location: [facility];  Service: Open Heart Surgery;  Laterality: N/A;  WITH CIRC ARREST  . CARDIAC SURGERY    .  COLONOSCOPY    . CYSTOSCOPY W/ RETROGRADES Bilateral 06/15/2013   Procedure: CYSTOSCOPY WITH BILATERAL RETROGRADE PYELOGRAM  BLADDER BIOPSY, PROSTATIC URETHRAL BIOPSY, ;  Surgeon: Molli Hazard, MD;  Location: [facility];  Service: Urology;  Laterality: Bilateral;  . ESOPHAGOGASTRODUODENOSCOPY (EGD) WITH PROPOFOL N/A 03/11/2018   Procedure: ESOPHAGOGASTRODUODENOSCOPY (EGD) WITH PROPOFOL;  Surgeon: Clarene Essex, MD;  Location: [facility];  Service:  Endoscopy;  Laterality: N/A;  . LAPAROSCOPIC CHOLECYSTECTOMY  01-14-2001  . RIGHT/LEFT HEART CATH AND CORONARY ANGIOGRAPHY N/A 06/12/2017   Procedure: RIGHT/LEFT HEART CATH AND CORONARY ANGIOGRAPHY;  Surgeon: Larey Dresser, MD;  Location: [facility];  Service: Cardiovascular;  Laterality: N/A;  . TEE WITHOUT CARDIOVERSION N/A 07/02/2017   Procedure: TRANSESOPHAGEAL ECHOCARDIOGRAM (TEE);  Surgeon: Prescott Gum, Collier Salina, MD;  Location: [facility];  Service: Open Heart Surgery;  Laterality: N/A;  . UMBILICAL HERNIA REPAIR  01-14-2008       Family History  Problem Relation Age of Onset  . Aortic aneurysm Mother 56       cause of death  . Other Father        motor vehicle accident  . Heart disease Other        family history    Social History   Tobacco Use  . Smoking status: Former Smoker    Packs/day: 0.50    Years: 27.00    Pack years: 13.50    Types: Cigarettes    Quit date: 10/21/1979    Years since quitting: 41.0  . Smokeless tobacco: Never Used  Vaping Use  . Vaping Use: Never used  Substance Use Topics  . Alcohol use: Yes    Alcohol/week: 4.0 standard drinks    Types: 4 Cans of beer per week    Comment: drinks "as much as I could" - 2-3 beers up to 8-10  . Drug use: Never    Home Medications Prior to Admission medications   Medication Sig Start Date End Date Taking? Authorizing Provider  acetaminophen (TYLENOL) 500 MG tablet Take 1,000 mg by mouth every 8 (eight) hours as needed for mild pain, fever or headache. Per bottle as needed    [provider]  albuterol (VENTOLIN HFA) 108 (90 Base) MCG/ACT inhaler INHALE 2 PUFFS INTO THE LUNGS EVERY 6 HOURS AS NEEDED FOR WHEEZING OR SHORTNESS OF BREATH 12/22/19   Tanda Rockers, MD  amoxicillin (AMOXIL) 500 MG capsule Take 4 capsules (2,000 mg total) by mouth See admin instructions. 1 hour prior to dental procedure 10/03/20   Prescott Gum, Collier Salina, MD  azelastine (ASTELIN) 137 MCG/SPRAY nasal spray Place 1-2 sprays into the  nose 2 (two) times daily as needed for rhinitis or allergies.     [provider]  benzonatate (TESSALON) 200 MG capsule TAKE 1 CAPSULE BY MOUTH EVERY 8 HOURS AS NEEDED FOR COUGH 07/05/20   Tanda Rockers, MD  chlorpheniramine (CHLOR-TRIMETON) 4 MG tablet Take 4 mg by mouth as needed for allergies.    [provider]  clobetasol ointment (TEMOVATE) [id number] % Apply 1 application topically daily. 02/07/20   Lavonna Monarch, MD  COLCRYS 0.6 MG tablet Take 0.6 mg by mouth daily.  01/29/12   [provider]  Dextromethorphan-guaiFENesin (MUCINEX DM MAXIMUM STRENGTH) 60-1200 MG TB12 Take 1 tablet by mouth daily.    [provider]  DHEA 50 MG TABS Take 50 mg by mouth daily with breakfast.     [provider]  EPINEPHrine 0.3 mg/0.3 mL IJ SOAJ injection  Inject 0.3 mLs (0.3 mg total) into the muscle as needed (for angioedema). 02/26/18   Josue Hector, MD  Erenumab-aooe (AIMOVIG) 140 MG/ML SOAJ Inject 140 mg into the skin every 30 (thirty) days. 04/13/20   Cameron Sprang, MD  Ferrous Sulfate (IRON) 142 (45 Fe) MG TBCR Take by mouth in the morning and at bedtime.    [provider]  finasteride (PROSCAR) 5 MG tablet Take 5 mg by mouth at bedtime.  01/14/14   [provider]  levothyroxine (SYNTHROID, LEVOTHROID) 112 MCG tablet Take 112 mcg by mouth daily before breakfast.     [provider]  liothyronine (CYTOMEL) 25 MCG tablet Take 25 mcg by mouth daily.    [provider]  losartan (COZAAR) 50 MG tablet TAKE 1 TABLET BY MOUTH EVERY DAY 10/01/20   Prescott Gum, Collier Salina, MD  Methylsulfonylmethane (MSM) POWD Take 4 g by mouth daily.    [provider]  metoprolol tartrate (LOPRESSOR) 50 MG tablet Take 1 tablet (50 mg total) by mouth 2 (two) times daily. 03/28/20   Josue Hector, MD  mupirocin ointment (BACTROBAN) 2 % Apply to open sores qd-bid as needed 04/03/20   Lavonna Monarch, MD  omeprazole (PRILOSEC) 40 MG capsule Take 1  capsule (40 mg total) by mouth 2 (two) times daily. 10/31/16   Josue Hector, MD  predniSONE (DELTASONE) 10 MG tablet prednisone 10 mg tablet  take 1 tab three times a day for 2 days, 1 tab twice a day for 5 days, 1 tab daily till finished    [provider]  rivaroxaban (XARELTO) 20 MG TABS tablet Take 1 tablet (20 mg total) by mouth daily with supper. 03/28/20   Josue Hector, MD  rizatriptan (MAXALT) 10 MG tablet Take 1 tablet (10 mg total) by mouth as needed for migraine. May repeat in 2 hours if needed 04/13/20   Cameron Sprang, MD  SYMBICORT 160-4.5 MCG/ACT inhaler INHALE 2 PUFFS INTO THE LUNGS TWICE DAILY 07/31/20   Tanda Rockers, MD  tamsulosin (FLOMAX) 0.4 MG CAPS capsule Take 0.4 mg by mouth daily after supper.    [provider]  testosterone cypionate (DEPOTESTOSTERONE CYPIONATE) 200 MG/ML injection testosterone cypionate 200 mg/mL intramuscular oil  ADMINISTER 1 ML IN THE MUSCLE 1 TIME A WEEK    [provider]  testosterone cypionate (DEPOTESTOTERONE CYPIONATE) 100 MG/ML injection Inject 100 mg into the muscle every Thursday. For IM use only    [provider]  venlafaxine (EFFEXOR-XR) 150 MG 24 hr capsule Take 150 mg by mouth daily with breakfast.    [provider]    Allergies    Perphenazine, Amiodarone, Codeine, and Tramadol  Review of Systems   Review of Systems  Constitutional: Positive for activity change, appetite change, fatigue and fever.  HENT: Negative.   Respiratory: Negative.   Cardiovascular: Negative.   Gastrointestinal: Positive for abdominal pain, nausea and vomiting. Negative for abdominal distention, anal bleeding, blood in stool, constipation, diarrhea and rectal pain.  Genitourinary: Negative.   Musculoskeletal: Negative.   Skin: Negative.   Neurological: Positive for weakness (Generalized).  All other systems reviewed and are negative.   Physical Exam Updated Vital Signs BP 116/86   Pulse (!) 110    Temp 100.1 F (37.8 C)   Resp 17   Ht 5\' 8"  (1.727 m)   Wt 124.7 kg   SpO2 92%   BMI 41.81 kg/m   Physical Exam Vitals and nursing note reviewed.  Constitutional:      General: He is in acute distress.     Appearance: He is well-developed and well-nourished. He is obese. He is ill-appearing. He is not diaphoretic.     Comments: Diaphoretic  HENT:     Head: Atraumatic.  Eyes:     Pupils: Pupils are equal, round, and reactive to light.  Cardiovascular:     Rate and Rhythm: Regular rhythm. Tachycardia present.     Pulses: Normal pulses.          Radial pulses are 2+ on the right side and 2+ on the left side.       Dorsalis pedis pulses are 2+ on the right side and 2+ on the left side.     Heart sounds: Normal heart sounds.  Pulmonary:     Effort: Pulmonary effort is normal. No respiratory distress.     Breath sounds: Normal breath sounds.     Comments: Speaks in full sentences without difficulty.  Clear to auscultation bilaterally. Abdominal:     General: Bowel sounds are normal. There is no distension.     Palpations: Abdomen is soft.     Tenderness: There is abdominal tenderness in the left lower quadrant. There is guarding. There is no right CVA tenderness, left CVA tenderness or rebound. Negative signs include Murphy's sign and McBurney's sign.     Hernia: No hernia is present.     Comments: Diffuse tenderness with guarding to left lower quadrant.   Musculoskeletal:        General: Normal range of motion.     Cervical back: Normal range of motion and neck supple.     Comments: Compartments soft, No bony tenderness. Moves all 4 extremities without difficulty.   Skin:    General: Skin is warm and dry.     Capillary Refill: Capillary refill takes 2 to 3 seconds.  Neurological:     General: No focal deficit present.     Mental Status: [name], place, and time.  Psychiatric:        Mood and Affect: Mood and affect normal.    ED Results /  Procedures / Treatments   Labs (all labs ordered are listed, but only abnormal results are displayed) Labs Reviewed  COMPREHENSIVE METABOLIC PANEL - Abnormal; Notable for the following components:      Result Value   Glucose, Bld 125 (*)    Creatinine, Ser 1.25 (*)    Calcium 8.4 (*)    Total Protein 6.1 (*)    All other components within normal limits  CBC - Abnormal; Notable for the following components:   WBC 16.2 (*)    Hemoglobin 17.4 (*)    Platelets 127 (*)    All other components within normal limits  LACTIC ACID, PLASMA - Abnormal; Notable for the following components:   Lactic Acid, Venous 2.4 (*)    All other components within normal limits  PROTIME-INR - Abnormal; Notable for the following components:   Prothrombin Time 16.4 (*)    INR 1.4 (*)    All other components within normal limits  RESP PANEL BY RT-PCR (FLU A&B, COVID) ARPGX2  CULTURE, BLOOD (ROUTINE X 2)  CULTURE, BLOOD (ROUTINE X 2)  URINE CULTURE  LIPASE, BLOOD  LACTIC ACID, PLASMA  APTT  URINALYSIS, ROUTINE W REFLEX MICROSCOPIC  TROPONIN I (HIGH SENSITIVITY)  TROPONIN I (HIGH SENSITIVITY)    EKG None  Radiology DG Chest Port 1 View  Result Date:  10/10/2020 CLINICAL DATA:  Sepsis EXAM: PORTABLE CHEST 1 VIEW COMPARISON:  02/08/2020 FINDINGS: Prior median sternotomy and cardiac valve replacement. Stable cardiomediastinal contours. No focal airspace consolidation, pleural effusion, or pneumothorax. IMPRESSION: No acute cardiopulmonary findings. Electronically Signed   By: Davina Poke D.O.   On: 10/10/2020 14:52   CT Angio Chest/Abd/Pel for Dissection W and/or W/WO  Result Date: 10/10/2020 CLINICAL DATA:  Thoracic aortic aneurysm suspected. Left-sided flank pain. EXAM: CT ANGIOGRAPHY CHEST, ABDOMEN AND PELVIS TECHNIQUE: Non-contrast CT of the chest was initially obtained. Multidetector CT imaging through the chest, abdomen and pelvis was performed using the standard protocol during bolus  administration of intravenous contrast. Multiplanar reconstructed images and MIPs were obtained and reviewed to evaluate the vascular anatomy. CONTRAST:  145mL OMNIPAQUE IOHEXOL 350 MG/ML SOLN COMPARISON:  Mar 08, 2018. FINDINGS: CTA CHEST FINDINGS Cardiovascular: There are postsurgical changes of the thorax related to prior aortic valve replacement. The heart size is relatively normal. There is no evidence for thoracic aortic dissection or aneurysm. There is no significant pericardial effusion. Minimal atherosclerotic changes are noted of the thoracic aorta. There is no large centrally located pulmonary embolism. Mediastinum/Nodes: -- No mediastinal lymphadenopathy. -- No hilar lymphadenopathy. -- No axillary lymphadenopathy. -- No supraclavicular lymphadenopathy. -- Normal thyroid gland where visualized. -  Unremarkable esophagus. Lungs/Pleura: Airways are patent. No pleural effusion, lobar consolidation, pneumothorax or pulmonary infarction. Musculoskeletal: No chest wall abnormality. No bony spinal canal stenosis. Review of the MIP images confirms the above findings. CTA ABDOMEN AND PELVIS FINDINGS VASCULAR Aorta: Normal caliber aorta without aneurysm, dissection, vasculitis or significant stenosis. Celiac: Patent without evidence of aneurysm, dissection, vasculitis or significant stenosis. SMA: Patent without evidence of aneurysm, dissection, vasculitis or significant stenosis. Renals: Both renal arteries are patent without evidence of aneurysm, dissection, vasculitis, fibromuscular dysplasia or significant stenosis. IMA: Patent without evidence of aneurysm, dissection, vasculitis or significant stenosis. Inflow: Patent without evidence of aneurysm, dissection, vasculitis or significant stenosis. Veins: No obvious venous abnormality within the limitations of this arterial phase study. Review of the MIP images confirms the above findings. NON-VASCULAR Hepatobiliary: The liver is normal. Status post  cholecystectomy.There is no biliary ductal dilation. Pancreas: Normal contours without ductal dilatation. No peripancreatic fluid collection. Spleen: The spleen is borderline enlarged measuring approximately 13 cm craniocaudad. Adrenals/Urinary Tract: --Adrenal glands: Unremarkable. --Right kidney/ureter: No hydronephrosis or radiopaque kidney stones. --Left kidney/ureter: No hydronephrosis or radiopaque kidney stones. --Urinary bladder: Unremarkable. Stomach/Bowel: --Stomach/Duodenum: No hiatal hernia or other gastric abnormality. Normal duodenal course and caliber. --Small bowel: Unremarkable. --Colon: There is diffuse sigmoid diverticulosis. There is wall thickening of the sigmoid colon with adjacent mild inflammatory changes suggestive of acute uncomplicated diverticulitis. --Appendix: Normal. Vascular/Lymphatic: Atherosclerotic calcification is present within the non-aneurysmal abdominal aorta, without hemodynamically significant stenosis. --No retroperitoneal lymphadenopathy. --No mesenteric lymphadenopathy. --No pelvic or inguinal lymphadenopathy. Reproductive: The prostate gland is enlarged. Other: No ascites or free air. The abdominal wall is normal. Musculoskeletal. No acute displaced fractures. Review of the MIP images confirms the above findings. IMPRESSION: 1. There is no evidence for thoracic or abdominal aortic dissection or aneurysm. 2. Diffuse sigmoid diverticulosis with wall thickening of the sigmoid colon with adjacent mild inflammatory changes suggestive of acute uncomplicated diverticulitis. As an underlying mass cannot be excluded, follow-up with an outpatient colonoscopy is recommended. 3. Borderline splenomegaly. 4. Enlarged prostate gland. Aortic Atherosclerosis (ICD10-I70.0). Electronically Signed   By: Constance Holster M.D.   On: 10/10/2020 17:12    Procedures .Critical Care Performed by: Nettie Elm, PA-C  Authorized by: Nettie Elm, PA-C   Critical care provider  statement:    Critical care time (minutes):  45   Critical care was necessary to treat or prevent imminent or life-threatening deterioration of the following conditions:  Sepsis   Critical care was time spent personally by me on the following activities:  Discussions with consultants, evaluation of patient's response to treatment, examination of patient, ordering and performing treatments and interventions, ordering and review of laboratory studies, ordering and review of radiographic studies, pulse oximetry, re-evaluation of patient's condition, obtaining history from patient or surrogate and review of old charts   (including critical care time)  Medications Ordered in ED Medications  lactated ringers infusion ( Intravenous New Bag/Given 10/10/20 1605)  lactated ringers bolus 1,000 mL (0 mLs Intravenous Stopped 10/10/20 1832)    And  lactated ringers bolus 1,000 mL (0 mLs Intravenous Stopped 10/10/20 1833)    And  lactated ringers bolus 200 mL (0 mLs Intravenous Stopped 10/10/20 1533)  piperacillin-tazobactam (ZOSYN) IVPB 3.375 g (0 g Intravenous Stopped 10/10/20 1527)  vancomycin (VANCOCIN) 2,500 mg in sodium chloride 0.9 % 500 mL IVPB (0 mg Intravenous Stopped 10/10/20 1833)  fentaNYL (SUBLIMAZE) injection 50 mcg (50 mcg Intravenous Given 10/10/20 1524)  iohexol (OMNIPAQUE) 350 MG/ML injection 100 mL (100 mLs Intravenous Contrast Given 10/10/20 1611)  acetaminophen (TYLENOL) tablet 650 mg (650 mg Oral Given 10/10/20 1833)  fentaNYL (SUBLIMAZE) injection 100 mcg (100 mcg Intravenous Given 10/10/20 1833)    ED Course  I have reviewed the triage vital signs and the nursing notes.  Pertinent labs & imaging results that were available during my care of the patient were reviewed by me and considered in my medical decision making (see chart for details).  69 year old presents for evaluation abdominal pain.  On arrival patient is febrile, tachycardic, tachypneic and diaphoretic.  He appears  ill.  Code sepsis called.  Left lower quadrant abdominal pain.  Does have history of AAA with repair.  He is neurovascularly intact and has intact radial and distal pulses.  His heart and lungs are clear.  Does have diffuse tenderness however worse the left lower quadrant with guarding.  States this occurred suddenly.  Given sudden onset, history of AAA will obtain dissection study.  No labs, imaging and reassess.  Attending, Dr. Johnney Killian in to evaluate patient.  Plan on labs, imaging and reassess  Labs and imaging personally reviewed and interpreted:  CBC with leukocytosis at [id number] Metabolic panel with glucose 125, creatinine 1.25, up from prior, calcium 8.4, noticed electrolyte, renal abnormality Lipase 37 Covid negative Troponin X Lactic acid 1.8 DG chest without infiltrates, cardiomegaly, pulmonary edema, pneumothorax CTA chest, abd, pelvis without dissection, aneurysm does have acute sigmoid diverticulitis without perforation or abscess. EKG without ischemic changes  Patient reassessed.  Requesting additional pain medication.  Has been given IV antibiotics, IV fluids consistent with sepsis protocol.  Maps greater than 65, lactic acid less than 4.  No evidence of shock.  Will give additional pain medication and admit given sepsis criteria, known source of infection.  CONSULT with Dr. Jonelle Sidle with [facility] who agrees to evaluate patient for admission.  The patient appears reasonably stabilized for admission considering the current resources, flow, and capabilities available in the ED at this time, and I doubt any other [facility] requiring further screening and/or treatment in the ED prior to admission.  Patient seen eval by attending, Dr. Vallery Ridge who agrees with above treatment, plan and disposition.    MDM Rules/Calculators/A&P  Final Clinical Impression(s) / ED Diagnoses Final diagnoses:  SIRS (systemic inflammatory response syndrome) ([place])  Diverticulitis    Rx / DC  Orders ED Discharge Orders    None       Emanuelle Hammerstrom A, PA-C 10/10/20 1923    Charlesetta Shanks, MD 10/11/20 [phone]

## 2020-10-10 NOTE — ED Triage Notes (Addendum)
BIB EMS with abd pain that started about 1100 today. Has History of Diverticulitis. Some dizziness with pain. Pt diaphoretic in triage. 138/90-128-CBG 107 94% RA

## 2020-10-10 NOTE — Sepsis Progress Note (Signed)
eLink monitoring this Code Sepsis. 

## 2020-10-10 NOTE — ED Notes (Addendum)
Date and time results received: 10/10/20 3:36 PM  (use smartphrase ".now" to insert current time)  Test: Lactic Acid Critical Value:2.4  Name of Provider Notified:Britni PA  Orders Received? Or Actions Taken?:

## 2020-10-10 NOTE — Progress Notes (Signed)
A consult was received from an ED provider for Vancomycin per pharmacy dosing.  The patient's profile has been reviewed for ht/wt/allergies/indication/available labs.    A one time order has been placed for Vancomycin 2500mg  IV.  Further antibiotics/pharmacy consults should be ordered by admitting physician if indicated.                       Thank you, Luiz Ochoa 10/10/2020  2:26 PM

## 2020-10-11 ENCOUNTER — Encounter (HOSPITAL_COMMUNITY): Payer: Self-pay

## 2020-10-11 DIAGNOSIS — G4733 Obstructive sleep apnea (adult) (pediatric): Secondary | ICD-10-CM

## 2020-10-11 DIAGNOSIS — Z7901 Long term (current) use of anticoagulants: Secondary | ICD-10-CM | POA: Diagnosis not present

## 2020-10-11 DIAGNOSIS — M797 Fibromyalgia: Secondary | ICD-10-CM | POA: Diagnosis not present

## 2020-10-11 DIAGNOSIS — K5732 Diverticulitis of large intestine without perforation or abscess without bleeding: Secondary | ICD-10-CM | POA: Diagnosis not present

## 2020-10-11 DIAGNOSIS — J42 Unspecified chronic bronchitis: Secondary | ICD-10-CM | POA: Diagnosis not present

## 2020-10-11 DIAGNOSIS — I480 Paroxysmal atrial fibrillation: Secondary | ICD-10-CM | POA: Diagnosis not present

## 2020-10-11 DIAGNOSIS — J45909 Unspecified asthma, uncomplicated: Secondary | ICD-10-CM | POA: Diagnosis not present

## 2020-10-11 DIAGNOSIS — F32A Depression, unspecified: Secondary | ICD-10-CM | POA: Diagnosis not present

## 2020-10-11 DIAGNOSIS — Z20822 Contact with and (suspected) exposure to covid-19: Secondary | ICD-10-CM | POA: Diagnosis not present

## 2020-10-11 DIAGNOSIS — A419 Sepsis, unspecified organism: Secondary | ICD-10-CM | POA: Diagnosis not present

## 2020-10-11 DIAGNOSIS — N400 Enlarged prostate without lower urinary tract symptoms: Secondary | ICD-10-CM | POA: Diagnosis not present

## 2020-10-11 DIAGNOSIS — R5382 Chronic fatigue, unspecified: Secondary | ICD-10-CM | POA: Diagnosis not present

## 2020-10-11 DIAGNOSIS — K5792 Diverticulitis of intestine, part unspecified, without perforation or abscess without bleeding: Secondary | ICD-10-CM | POA: Diagnosis not present

## 2020-10-11 DIAGNOSIS — M1990 Unspecified osteoarthritis, unspecified site: Secondary | ICD-10-CM | POA: Diagnosis not present

## 2020-10-11 DIAGNOSIS — D72829 Elevated white blood cell count, unspecified: Secondary | ICD-10-CM

## 2020-10-11 DIAGNOSIS — I10 Essential (primary) hypertension: Secondary | ICD-10-CM | POA: Diagnosis not present

## 2020-10-11 DIAGNOSIS — F419 Anxiety disorder, unspecified: Secondary | ICD-10-CM | POA: Diagnosis not present

## 2020-10-11 DIAGNOSIS — Z6841 Body Mass Index (BMI) 40.0 and over, adult: Secondary | ICD-10-CM | POA: Diagnosis not present

## 2020-10-11 DIAGNOSIS — K219 Gastro-esophageal reflux disease without esophagitis: Secondary | ICD-10-CM | POA: Diagnosis not present

## 2020-10-11 DIAGNOSIS — E039 Hypothyroidism, unspecified: Secondary | ICD-10-CM | POA: Diagnosis not present

## 2020-10-11 LAB — PROTIME-INR
INR: 1.4 — ABNORMAL HIGH (ref 0.8–1.2)
Prothrombin Time: 16.9 seconds — ABNORMAL HIGH (ref 11.4–15.2)

## 2020-10-11 LAB — COMPREHENSIVE METABOLIC PANEL
ALT: 36 U/L (ref 0–44)
AST: 28 U/L (ref 15–41)
Albumin: 3.1 g/dL — ABNORMAL LOW (ref 3.5–5.0)
Alkaline Phosphatase: 38 U/L (ref 38–126)
Anion gap: 8 (ref 5–15)
BUN: 16 mg/dL (ref 8–23)
CO2: 27 mmol/L (ref 22–32)
Calcium: 7.9 mg/dL — ABNORMAL LOW (ref 8.9–10.3)
Chloride: 103 mmol/L (ref 98–111)
Creatinine, Ser: 1.03 mg/dL (ref 0.61–1.24)
GFR, Estimated: >60 mL/min (ref >60)
Glucose, Bld: 96 mg/dL (ref 70–99)
Potassium: 4 mmol/L (ref 3.5–5.1)
Sodium: 138 mmol/L (ref 135–145)
Total Bilirubin: 1.1 mg/dL (ref 0.3–1.2)
Total Protein: 5.6 g/dL — ABNORMAL LOW (ref 6.5–8.1)

## 2020-10-11 LAB — CBC
HCT: 46.8 % (ref 39.0–52.0)
Hemoglobin: 15 g/dL (ref 13.0–17.0)
MCH: 32.4 pg (ref 26.0–34.0)
MCHC: 32.1 g/dL (ref 30.0–36.0)
MCV: 101.1 fL — ABNORMAL HIGH (ref 80.0–100.0)
Platelets: 102 10*3/uL — ABNORMAL LOW (ref 150–400)
RBC: 4.63 MIL/uL (ref 4.22–5.81)
RDW: 13.1 % (ref 11.5–15.5)
WBC: 12.8 10*3/uL — ABNORMAL HIGH (ref 4.0–10.5)
nRBC: 0 % (ref 0.0–0.2)

## 2020-10-11 LAB — CORTISOL-AM, BLOOD: Cortisol - AM: 7.1 ug/dL (ref 6.7–22.6)

## 2020-10-11 LAB — HIV ANTIBODY (ROUTINE TESTING W REFLEX): HIV Screen 4th Generation wRfx: NONREACTIVE

## 2020-10-11 LAB — PROCALCITONIN: Procalcitonin: 21.54 ng/mL

## 2020-10-11 MED ORDER — LACTATED RINGERS IV SOLN: INTRAVENOUS | Status: DC

## 2020-10-11 MED ORDER — ACETAMINOPHEN 500 MG PO TABS
1000.0000 mg | ORAL_TABLET | Freq: Four times a day (QID) | ORAL | Status: DC | PRN
  Administered 2020-10-11 – 2020-10-13 (×4): 1000 mg via ORAL
  Filled 2020-10-11 (×4): qty 2

## 2020-10-11 MED ORDER — FINASTERIDE 5 MG PO TABS
5.0000 mg | ORAL_TABLET | Freq: Every day | ORAL | Status: DC
  Administered 2020-10-11 – 2020-10-12 (×2): 5 mg via ORAL
  Filled 2020-10-11 (×2): qty 1

## 2020-10-11 MED ORDER — ALPRAZOLAM 0.25 MG PO TABS
0.2500 mg | ORAL_TABLET | Freq: Three times a day (TID) | ORAL | Status: DC | PRN
  Administered 2020-10-11 – 2020-10-12 (×3): 0.25 mg via ORAL
  Filled 2020-10-11 (×3): qty 1

## 2020-10-11 MED ORDER — KETOROLAC TROMETHAMINE 15 MG/ML IJ SOLN
15.0000 mg | Freq: Four times a day (QID) | INTRAMUSCULAR | Status: DC | PRN
  Administered 2020-10-11 – 2020-10-12 (×5): 15 mg via INTRAVENOUS
  Filled 2020-10-11 (×5): qty 1

## 2020-10-11 MED ORDER — CALCIUM CARBONATE ANTACID 500 MG PO CHEW
1.0000 | CHEWABLE_TABLET | Freq: Three times a day (TID) | ORAL | Status: DC | PRN
  Administered 2020-10-11: 200 mg via ORAL
  Filled 2020-10-11: qty 1

## 2020-10-11 MED ORDER — LORAZEPAM 2 MG/ML IJ SOLN
2.0000 mg | INTRAMUSCULAR | Status: DC | PRN
  Administered 2020-10-11: 2 mg via INTRAVENOUS
  Filled 2020-10-11: qty 1

## 2020-10-11 MED ORDER — LIP MEDEX EX OINT
TOPICAL_OINTMENT | CUTANEOUS | Status: DC | PRN
  Filled 2020-10-11: qty 7

## 2020-10-11 MED ORDER — METRONIDAZOLE IN NACL 5-0.79 MG/ML-% IV SOLN
500.0000 mg | Freq: Three times a day (TID) | INTRAVENOUS | Status: AC
  Administered 2020-10-11: 500 mg via INTRAVENOUS
  Filled 2020-10-11: qty 100

## 2020-10-11 MED ORDER — MORPHINE SULFATE (PF) 2 MG/ML IV SOLN
2.0000 mg | INTRAVENOUS | Status: DC | PRN
  Administered 2020-10-11 (×3): 2 mg via INTRAVENOUS
  Filled 2020-10-11: qty 1
  Filled 2020-10-11: qty 2
  Filled 2020-10-11: qty 1

## 2020-10-11 MED ORDER — METRONIDAZOLE 500 MG PO TABS
500.0000 mg | ORAL_TABLET | Freq: Three times a day (TID) | ORAL | Status: DC
  Administered 2020-10-11 – 2020-10-13 (×6): 500 mg via ORAL
  Filled 2020-10-11 (×6): qty 1

## 2020-10-11 MED ORDER — TAMSULOSIN HCL 0.4 MG PO CAPS
0.4000 mg | ORAL_CAPSULE | Freq: Every day | ORAL | Status: DC
  Administered 2020-10-12: 0.4 mg via ORAL
  Filled 2020-10-11 (×2): qty 1

## 2020-10-11 MED ORDER — SUMATRIPTAN SUCCINATE 50 MG PO TABS
100.0000 mg | ORAL_TABLET | Freq: Every day | ORAL | Status: DC | PRN
  Filled 2020-10-11: qty 2

## 2020-10-11 MED ORDER — VENLAFAXINE HCL ER 150 MG PO CP24
150.0000 mg | ORAL_CAPSULE | Freq: Every day | ORAL | Status: DC
  Administered 2020-10-11 – 2020-10-13 (×3): 150 mg via ORAL
  Filled 2020-10-11 (×3): qty 1

## 2020-10-11 NOTE — Progress Notes (Signed)
Patient ID: Grant [name], male   DOB: Oct 20, 1951, 69 y.o.   MRN: [id number]  PROGRESS NOTE    Grant [name]  BHA:[id number] DOB: Apr 01, 1951 DOA: 10/10/2020 PCP: Shirline Frees, MD   Brief Narrative:  69 y.o. male with medical history significant of recurrent diverticulitis, asthma, depression, status post aortic valve replacement in 2018, fibromyalgia, GERD, chronic migraines, obstructive sleep apnea, hypothyroidism and paroxysmal atrial fibrillation who presented to the ER with abdominal pain with nausea and an episode of vomiting.  On presentation, he had a temperature of 100.1, tachycardic, respiratory rate of 20 with white count of 16.2 and lactic acid of 2.4.  CT of the abdomen and pelvis showed sigmoid diverticulitis.  He was admitted for sepsis secondary to sigmoid diverticulitis and started on IV fluids and antibiotics.  Assessment & Plan:   Sepsis: Present on admission Acute sigmoid diverticulitis Leukocytosis -Patient presented with a temperature of 100.1, tachycardic, respiratory rate of 20 with white count of 16.2, lactic acid of 2.4 most likely secondary to sigmoid diverticulitis as evident on CT of the abdomen and pelvis. -Currently on Rocephin and Flagyl.  Continue.  Decrease IV fluids to 75 cc an hour.  Clear liquid diet and advance as tolerated. -will need outpatient follow-up with GI in 6 to 8 weeks time  Essential hypertension -Blood pressure stable.  Continue metoprolol  Hypothyroidism -Continue levothyroxine and liothyronine  Paroxysmal atrial fibrillation--rate mostly controlled.  Continue Xarelto  GERD -Continue Protonix  OSA -Continue CPAP at night  Morbid obesity -Outpatient follow-up   DVT prophylaxis: Xarelto Code Status: Full Family Communication: None at bedside Disposition Plan: Status is: Inpatient  Remains inpatient appropriate because:Inpatient level of care appropriate due to severity of illness   Dispo: The patient is from: Home               Anticipated d/c is to: Home              Anticipated d/c date is: 1 day              Patient currently is not medically stable to d/c.  Consultants: None  Procedures: None  Antimicrobials: Rocephin and Flagyl from 10/10/2020 onwards   Subjective: Patient seen and examined at bedside.  Denies current nausea.  States that his abdominal pain is improving.  No overnight fever reported.   Objective: Vitals:   10/11/20 0014 10/11/20 0135 10/11/20 0628 10/11/20 0928  BP: 117/81 126/87 117/79 129/80  Pulse: 77 (!) 54 69 69  Resp: 18 16 18 19   Temp: 97.9 F (36.6 C) 98.2 F (36.8 C) 98.7 F (37.1 C) 98.3 F (36.8 C)  TempSrc: Oral Oral Oral Oral  SpO2:  97% 95% 95%  Weight:   129.3 kg   Height:        Intake/Output Summary (Last 24 hours) at 10/11/2020 1100 Last data filed at 10/11/2020 0931 Gross per 24 hour  Intake 5015.13 ml  Output 790 ml  Net 4225.13 ml   Filed Weights   10/10/20 1408 10/11/20 0628  Weight: 124.7 kg 129.3 kg    Examination:  General exam: Appears calm and comfortable  Respiratory system: Bilateral decreased breath sounds at bases Cardiovascular system: S1 & S2 heard, Rate controlled Gastrointestinal system: Abdomen is morbidly obese, nondistended, soft and mildly tender in the lower quadrant. Normal bowel sounds heard. Extremities: No cyanosis, clubbing, edema  Central nervous system: Alert and oriented. No focal neurological deficits. Moving extremities Skin: No rashes, lesions or ulcers Psychiatry: Judgement and  insight appear normal. Mood & affect appropriate.     Data Reviewed: I have personally reviewed following labs and imaging studies  CBC: Recent Labs  Lab 10/10/20 1446 10/11/20 0729  WBC 16.2* 12.8*  HGB 17.4* 15.0  HCT 51.2 46.8  MCV 96.8 101.1*  PLT 127* [id number]*   Basic Metabolic Panel: Recent Labs  Lab 10/10/20 1446 10/11/20 0729  NA 137 138  K 3.9 4.0  CL 102 103  CO2 26 27  GLUCOSE 125* 96  BUN 20 16   CREATININE 1.25* 1.03  CALCIUM 8.4* 7.9*   GFR: Estimated Creatinine Clearance: 91.3 mL/min (by C-G formula based on SCr of 1.03 mg/dL). Liver Function Tests: Recent Labs  Lab 10/10/20 1446 10/11/20 0729  AST 28 28  ALT 30 36  ALKPHOS 44 38  BILITOT 1.2 1.1  PROT 6.1* 5.6*  ALBUMIN 3.6 3.1*   Recent Labs  Lab 10/10/20 1446  LIPASE 37   No results for input(s): AMMONIA in the last 168 hours. Coagulation Profile: Recent Labs  Lab 10/10/20 1446 10/11/20 0729  INR 1.4* 1.4*   Cardiac Enzymes: No results for input(s): CKTOTAL, CKMB, CKMBINDEX, TROPONINI in the last 168 hours. BNP (last 3 results) No results for input(s): PROBNP in the last 8760 hours. HbA1C: No results for input(s): HGBA1C in the last 72 hours. CBG: No results for input(s): GLUCAP in the last 168 hours. Lipid Profile: No results for input(s): CHOL, HDL, LDLCALC, TRIG, CHOLHDL, LDLDIRECT in the last 72 hours. Thyroid Function Tests: No results for input(s): TSH, T4TOTAL, FREET4, T3FREE, THYROIDAB in the last 72 hours. Anemia Panel: No results for input(s): VITAMINB12, FOLATE, FERRITIN, TIBC, IRON, RETICCTPCT in the last 72 hours. Sepsis Labs: Recent Labs  Lab 10/10/20 1446 10/10/20 1632 10/11/20 0729  PROCALCITON  --   --  21.54  LATICACIDVEN 2.4* 1.8  --     Recent Results (from the past 240 hour(s))  Blood Culture (routine x 2)     Status: None (Preliminary result)   Collection Time: 10/10/20  2:46 PM   Specimen: BLOOD  Result Value Ref Range Status   Specimen Description   Final    BLOOD RIGHT ANTECUBITAL Performed at [facility] [address], [place], [place] [address]    Special Requests   Final    BOTTLES DRAWN AEROBIC AND ANAEROBIC Blood Culture adequate volume Performed at [facility] [address], [place], [place] [address]    Culture   Final    NO GROWTH < 24 HOURS Performed at [facility] [address], [place],  [place] [address]    Report Status PENDING  Incomplete  Resp Panel by RT-PCR (Flu A&B, Covid) Nasopharyngeal Swab     Status: None   Collection Time: 10/10/20  2:50 PM   Specimen: Nasopharyngeal Swab; Nasopharyngeal(NP) swabs in vial transport medium  Result Value Ref Range Status   SARS Coronavirus 2 by RT PCR NEGATIVE NEGATIVE Final    Comment: (NOTE) SARS-CoV-2 target nucleic acids are NOT DETECTED.  The SARS-CoV-2 RNA is generally detectable in upper respiratory specimens during the acute phase of infection. The lowest concentration of SARS-CoV-2 viral copies this assay can detect is 138 copies/mL. A negative result does not preclude SARS-Cov-2 infection and should not be used as the sole basis for treatment or other patient management decisions. A negative result may occur with  improper specimen collection/handling, submission of specimen other than nasopharyngeal swab, presence of viral mutation(s) within the areas  targeted by this assay, and inadequate number of viral copies(<138 copies/mL). A negative result must be combined with clinical observations, patient history, and epidemiological information. The expected result is Negative.  Fact Sheet for Patients:  EntrepreneurPulse.com.au  Fact Sheet for Healthcare Providers:  IncredibleEmployment.be  This test is no t yet approved or cleared by the [place] FDA and  has been authorized for detection and/or diagnosis of SARS-CoV-2 by FDA under an Emergency Use Authorization (EUA). This EUA will remain  in effect (meaning this test can be used) for the duration of the COVID-19 declaration under Section 564(b)(1) of the Act, 21 U.S.C.section 360bbb-3(b)(1), unless the authorization is terminated  or revoked sooner.       Influenza A by PCR NEGATIVE NEGATIVE Final   Influenza B by PCR NEGATIVE NEGATIVE Final    Comment: (NOTE) The Xpert Xpress SARS-CoV-2/FLU/RSV plus assay is intended as an  aid in the diagnosis of influenza from Nasopharyngeal swab specimens and should not be used as a sole basis for treatment. Nasal washings and aspirates are unacceptable for Xpert Xpress SARS-CoV-2/FLU/RSV testing.  Fact Sheet for Patients: EntrepreneurPulse.com.au  Fact Sheet for Healthcare Providers: IncredibleEmployment.be  This test is not yet approved or cleared by the [place] FDA and has been authorized for detection and/or diagnosis of SARS-CoV-2 by FDA under an Emergency Use Authorization (EUA). This EUA will remain in effect (meaning this test can be used) for the duration of the COVID-19 declaration under Section 564(b)(1) of the Act, 21 U.S.C. section 360bbb-3(b)(1), unless the authorization is terminated or revoked.  Performed at [facility], [place] [address], [place], [place] [address]   Blood Culture (routine x 2)     Status: None (Preliminary result)   Collection Time: 10/10/20  3:00 PM   Specimen: BLOOD RIGHT HAND  Result Value Ref Range Status   Specimen Description   Final    BLOOD RIGHT HAND Performed at [facility] [address], [place], [place] [address]    Special Requests   Final    BOTTLES DRAWN AEROBIC AND ANAEROBIC Blood Culture adequate volume Performed at [facility] [address], [place], [place] [address]    Culture   Final    NO GROWTH < 12 HOURS Performed at [facility] [address] [place],  [address]    Report Status PENDING  Incomplete         Radiology Studies: DG Chest Port 1 View  Result Date: 10/10/2020 CLINICAL DATA:  Sepsis EXAM: PORTABLE CHEST 1 VIEW COMPARISON:  02/08/2020 FINDINGS: Prior median sternotomy and cardiac valve replacement. Stable cardiomediastinal contours. No focal airspace consolidation, pleural effusion, or pneumothorax. IMPRESSION: No acute cardiopulmonary findings. Electronically Signed    By: Davina Poke D.O.   On: 10/10/2020 14:52   CT Angio Chest/Abd/Pel for Dissection W and/or W/WO  Result Date: 10/10/2020 CLINICAL DATA:  Thoracic aortic aneurysm suspected. Left-sided flank pain. EXAM: CT ANGIOGRAPHY CHEST, ABDOMEN AND PELVIS TECHNIQUE: Non-contrast CT of the chest was initially obtained. Multidetector CT imaging through the chest, abdomen and pelvis was performed using the standard protocol during bolus administration of intravenous contrast. Multiplanar reconstructed images and MIPs were obtained and reviewed to evaluate the vascular anatomy. CONTRAST:  162mL OMNIPAQUE IOHEXOL 350 MG/ML SOLN COMPARISON:  Mar 08, 2018. FINDINGS: CTA CHEST FINDINGS Cardiovascular: There are postsurgical changes of the thorax related to prior aortic valve replacement. The heart size is relatively normal. There is no evidence for thoracic  aortic dissection or aneurysm. There is no significant pericardial effusion. Minimal atherosclerotic changes are noted of the thoracic aorta. There is no large centrally located pulmonary embolism. Mediastinum/Nodes: -- No mediastinal lymphadenopathy. -- No hilar lymphadenopathy. -- No axillary lymphadenopathy. -- No supraclavicular lymphadenopathy. -- Normal thyroid gland where visualized. -  Unremarkable esophagus. Lungs/Pleura: Airways are patent. No pleural effusion, lobar consolidation, pneumothorax or pulmonary infarction. Musculoskeletal: No chest wall abnormality. No bony spinal canal stenosis. Review of the MIP images confirms the above findings. CTA ABDOMEN AND PELVIS FINDINGS VASCULAR Aorta: Normal caliber aorta without aneurysm, dissection, vasculitis or significant stenosis. Celiac: Patent without evidence of aneurysm, dissection, vasculitis or significant stenosis. SMA: Patent without evidence of aneurysm, dissection, vasculitis or significant stenosis. Renals: Both renal arteries are patent without evidence of aneurysm, dissection, vasculitis,  fibromuscular dysplasia or significant stenosis. IMA: Patent without evidence of aneurysm, dissection, vasculitis or significant stenosis. Inflow: Patent without evidence of aneurysm, dissection, vasculitis or significant stenosis. Veins: No obvious venous abnormality within the limitations of this arterial phase study. Review of the MIP images confirms the above findings. NON-VASCULAR Hepatobiliary: The liver is normal. Status post cholecystectomy.There is no biliary ductal dilation. Pancreas: Normal contours without ductal dilatation. No peripancreatic fluid collection. Spleen: The spleen is borderline enlarged measuring approximately 13 cm craniocaudad. Adrenals/Urinary Tract: --Adrenal glands: Unremarkable. --Right kidney/ureter: No hydronephrosis or radiopaque kidney stones. --Left kidney/ureter: No hydronephrosis or radiopaque kidney stones. --Urinary bladder: Unremarkable. Stomach/Bowel: --Stomach/Duodenum: No hiatal hernia or other gastric abnormality. Normal duodenal course and caliber. --Small bowel: Unremarkable. --Colon: There is diffuse sigmoid diverticulosis. There is wall thickening of the sigmoid colon with adjacent mild inflammatory changes suggestive of acute uncomplicated diverticulitis. --Appendix: Normal. Vascular/Lymphatic: Atherosclerotic calcification is present within the non-aneurysmal abdominal aorta, without hemodynamically significant stenosis. --No retroperitoneal lymphadenopathy. --No mesenteric lymphadenopathy. --No pelvic or inguinal lymphadenopathy. Reproductive: The prostate gland is enlarged. Other: No ascites or free air. The abdominal wall is normal. Musculoskeletal. No acute displaced fractures. Review of the MIP images confirms the above findings. IMPRESSION: 1. There is no evidence for thoracic or abdominal aortic dissection or aneurysm. 2. Diffuse sigmoid diverticulosis with wall thickening of the sigmoid colon with adjacent mild inflammatory changes suggestive of acute  uncomplicated diverticulitis. As an underlying mass cannot be excluded, follow-up with an outpatient colonoscopy is recommended. 3. Borderline splenomegaly. 4. Enlarged prostate gland. Aortic Atherosclerosis (ICD10-I70.0). Electronically Signed   By: Constance Holster M.D.   On: 10/10/2020 17:12        Scheduled Meds: . colchicine  0.6 mg Oral Daily  . levothyroxine  112 mcg Oral Q0600  . liothyronine  25 mcg Oral Q0600  . losartan  50 mg Oral Daily  . metoprolol tartrate  50 mg Oral BID  . metroNIDAZOLE  500 mg Oral Q8H  . pantoprazole  40 mg Oral Daily  . rivaroxaban  20 mg Oral Q supper   Continuous Infusions: . cefTRIAXone (ROCEPHIN)  IV Stopped (10/10/20 2315)  . lactated ringers 125 mL/hr at 10/11/20 0433          Aline August, MD [facility] 10/11/2020, 11:00 AM

## 2020-10-11 NOTE — Progress Notes (Signed)
Patient wife called nurse to room. Patient was shaking, clenching stating that he was aching all over. Patient was able to communicate with nurse stating he was hurting, cold all over, and restless unable to lay still. Patient stated if he focuses hard enough he can try to control pain. 2mg  IV ativan given, patient began to immediately shake less, stating he is beginning to feel better. Patient still clinching at jaw area. Patient is A&Ox4 throughout episode. Per wife patient did similar episode yesterday prior to coming to hospital.

## 2020-10-12 DIAGNOSIS — K5792 Diverticulitis of intestine, part unspecified, without perforation or abscess without bleeding: Secondary | ICD-10-CM | POA: Diagnosis not present

## 2020-10-12 DIAGNOSIS — M1990 Unspecified osteoarthritis, unspecified site: Secondary | ICD-10-CM | POA: Diagnosis not present

## 2020-10-12 DIAGNOSIS — Z7901 Long term (current) use of anticoagulants: Secondary | ICD-10-CM | POA: Diagnosis not present

## 2020-10-12 DIAGNOSIS — N400 Enlarged prostate without lower urinary tract symptoms: Secondary | ICD-10-CM | POA: Diagnosis not present

## 2020-10-12 DIAGNOSIS — R5382 Chronic fatigue, unspecified: Secondary | ICD-10-CM | POA: Diagnosis not present

## 2020-10-12 DIAGNOSIS — K219 Gastro-esophageal reflux disease without esophagitis: Secondary | ICD-10-CM | POA: Diagnosis not present

## 2020-10-12 DIAGNOSIS — G4733 Obstructive sleep apnea (adult) (pediatric): Secondary | ICD-10-CM | POA: Diagnosis not present

## 2020-10-12 DIAGNOSIS — J45909 Unspecified asthma, uncomplicated: Secondary | ICD-10-CM | POA: Diagnosis not present

## 2020-10-12 DIAGNOSIS — A419 Sepsis, unspecified organism: Secondary | ICD-10-CM | POA: Diagnosis not present

## 2020-10-12 DIAGNOSIS — J42 Unspecified chronic bronchitis: Secondary | ICD-10-CM | POA: Diagnosis not present

## 2020-10-12 DIAGNOSIS — M797 Fibromyalgia: Secondary | ICD-10-CM | POA: Diagnosis not present

## 2020-10-12 DIAGNOSIS — F419 Anxiety disorder, unspecified: Secondary | ICD-10-CM | POA: Diagnosis not present

## 2020-10-12 DIAGNOSIS — Z6841 Body Mass Index (BMI) 40.0 and over, adult: Secondary | ICD-10-CM | POA: Diagnosis not present

## 2020-10-12 DIAGNOSIS — E039 Hypothyroidism, unspecified: Secondary | ICD-10-CM | POA: Diagnosis not present

## 2020-10-12 DIAGNOSIS — Z20822 Contact with and (suspected) exposure to covid-19: Secondary | ICD-10-CM | POA: Diagnosis not present

## 2020-10-12 DIAGNOSIS — F32A Depression, unspecified: Secondary | ICD-10-CM | POA: Diagnosis not present

## 2020-10-12 DIAGNOSIS — I10 Essential (primary) hypertension: Secondary | ICD-10-CM | POA: Diagnosis not present

## 2020-10-12 DIAGNOSIS — K5732 Diverticulitis of large intestine without perforation or abscess without bleeding: Secondary | ICD-10-CM | POA: Diagnosis not present

## 2020-10-12 DIAGNOSIS — I480 Paroxysmal atrial fibrillation: Secondary | ICD-10-CM | POA: Diagnosis not present

## 2020-10-12 LAB — BASIC METABOLIC PANEL
Anion gap: 10 (ref 5–15)
BUN: 11 mg/dL (ref 8–23)
CO2: 23 mmol/L (ref 22–32)
Calcium: 7.9 mg/dL — ABNORMAL LOW (ref 8.9–10.3)
Chloride: 104 mmol/L (ref 98–111)
Creatinine, Ser: 0.92 mg/dL (ref 0.61–1.24)
GFR, Estimated: >60 mL/min (ref >60)
Glucose, Bld: 116 mg/dL — ABNORMAL HIGH (ref 70–99)
Potassium: 3.7 mmol/L (ref 3.5–5.1)
Sodium: 137 mmol/L (ref 135–145)

## 2020-10-12 LAB — C-REACTIVE PROTEIN: CRP: 10.6 mg/dL — ABNORMAL HIGH (ref <1.0)

## 2020-10-12 LAB — CBC WITH DIFFERENTIAL/PLATELET
Abs Immature Granulocytes: 0.06 10*3/uL (ref 0.00–0.07)
Basophils Absolute: 0 10*3/uL (ref 0.0–0.1)
Basophils Relative: 0 %
Eosinophils Absolute: 0.1 10*3/uL (ref 0.0–0.5)
Eosinophils Relative: 1 %
HCT: 45.8 % (ref 39.0–52.0)
Hemoglobin: 15.3 g/dL (ref 13.0–17.0)
Immature Granulocytes: 1 %
Lymphocytes Relative: 7 %
Lymphs Abs: 0.6 10*3/uL — ABNORMAL LOW (ref 0.7–4.0)
MCH: 32.7 pg (ref 26.0–34.0)
MCHC: 33.4 g/dL (ref 30.0–36.0)
MCV: 97.9 fL (ref 80.0–100.0)
Monocytes Absolute: 1.1 10*3/uL — ABNORMAL HIGH (ref 0.1–1.0)
Monocytes Relative: 12 %
Neutro Abs: 6.8 10*3/uL (ref 1.7–7.7)
Neutrophils Relative %: 79 %
Platelets: 109 10*3/uL — ABNORMAL LOW (ref 150–400)
RBC: 4.68 MIL/uL (ref 4.22–5.81)
RDW: 13 % (ref 11.5–15.5)
WBC: 8.7 10*3/uL (ref 4.0–10.5)
nRBC: 0 % (ref 0.0–0.2)

## 2020-10-12 LAB — MAGNESIUM: Magnesium: 1.7 mg/dL (ref 1.7–2.4)

## 2020-10-12 NOTE — Progress Notes (Signed)
Patient ambulated the length of the hall on [address], tolerated well. He ambulated with the tech, Rosaria Ferries.

## 2020-10-12 NOTE — Progress Notes (Signed)
Patient ID: Grant [name], male   DOB: [birth year], 69 y.o.   MRN: [id number]  PROGRESS NOTE    Grant [name]  FGH:[id number] DOB: 01-22-1951 DOA: 10/10/2020 PCP: Shirline Frees, MD   Brief Narrative:  69 y.o. male with medical history significant of recurrent diverticulitis, asthma, depression, status post aortic valve replacement in 2018, fibromyalgia, GERD, chronic migraines, obstructive sleep apnea, hypothyroidism and paroxysmal atrial fibrillation who presented to the ER with abdominal pain with nausea and an episode of vomiting.  On presentation, he had a temperature of 100.1, tachycardic, respiratory rate of 20 with white count of 16.2 and lactic acid of 2.4.  CT of the abdomen and pelvis showed sigmoid diverticulitis.  He was admitted for sepsis secondary to sigmoid diverticulitis and started on IV fluids and antibiotics.  Assessment & Plan:   Sepsis: Present on admission Acute sigmoid diverticulitis Leukocytosis -Patient presented with a temperature of 100.1, tachycardic, respiratory rate of 20 with white count of 16.2, lactic acid of 2.4 most likely secondary to sigmoid diverticulitis as evident on CT of the abdomen and pelvis. -T-max of 100.7 over the last 24 hours.  Leukocytosis has resolved.  CRP 10.6 today. -Currently on Rocephin and Flagyl.  Continue.  Currently tolerating diet.  DC IV fluids -will need outpatient follow-up with GI in 6 to 8 weeks time  Essential hypertension -Blood pressure stable.  Continue metoprolol  Hypothyroidism -Continue levothyroxine and liothyronine  Paroxysmal atrial fibrillation--rate mostly controlled.  Continue Xarelto  GERD -Continue Protonix  OSA -Continue CPAP at night  Morbid obesity -Outpatient follow-up  ?  Complicated migraine history versus panic attack versus?  Seizure -Patient has history of abnormal body movements specially in stressful condition and has followed up with neurology in the past who thinks that patient  has complicated migraine.  Patient needed to have IV Ativan last night and wife thinks that he was panicking and improved with Ativan.  Will need outpatient neurology follow-up.  Will not start any seizure medications for now.  Continue as needed triptan   DVT prophylaxis: Xarelto Code Status: Full Family Communication: Wife at bedside Disposition Plan: Status is: Inpatient  Remains inpatient appropriate because:Inpatient level of care appropriate due to severity of illness   Dispo: The patient is from: Home              Anticipated d/c is to: Home              Anticipated d/c date is: 1 day              Patient currently is not medically stable to d/c.  Consultants: None  Procedures: None  Antimicrobials: Rocephin and Flagyl from 10/10/2020 onwards   Subjective: Patient seen and examined at bedside.  Poor historian.  Wife present at bedside.  Nursing staff report that patient was given Ativan last night for possible seizure-like activity.  Denies worsening abdominal pain.  Had fever last night.  No vomiting, chest pain or worsening shortness of breath reported.   Objective: Vitals:   10/11/20 2046 10/12/20 0100 10/12/20 0456 10/12/20 1117  BP: (!) 156/94 127/83 (!) 145/88 (!) 157/95  Pulse: (!) 103 70 76 79  Resp: 20 20    Temp: (!) 100.7 F (38.2 C) (!) 97.1 F (36.2 C) 98 F (36.7 C)   TempSrc: Oral Oral Axillary   SpO2: 93% 93%    Weight:      Height:        Intake/Output Summary (Last 24 hours) at  10/12/2020 1125 Last data filed at 10/12/2020 0400 Gross per 24 hour  Intake 720 ml  Output 900 ml  Net -180 ml   Filed Weights   10/10/20 1408 10/11/20 0628  Weight: 124.7 kg 129.3 kg    Examination:  General exam: Appears calm and comfortable.  Poor historian.  No distress. Respiratory system: Decreased breath sounds at bases bilaterally with no wheezing Cardiovascular system: Rate controlled, S1-S2 heard Gastrointestinal system: Abdomen is morbidly obese,  nondistended, soft and still mildly tender in the lower quadrant.  Bowel sounds are heard  extremities: Mild lower extremity edema.  No clubbing Central nervous system: Awake, answers some questions. No focal neurological deficits.  Moves extremities. Skin: No obvious ecchymosis/lesions Psychiatry: Flat affect   Data Reviewed: I have personally reviewed following labs and imaging studies  CBC: Recent Labs  Lab 10/10/20 1446 10/11/20 0729 10/12/20 0554  WBC 16.2* 12.8* 8.7  NEUTROABS  --   --  6.8  HGB 17.4* 15.0 15.3  HCT 51.2 46.8 45.8  MCV 96.8 101.1* 97.9  PLT 127* 102* 0000000*   Basic Metabolic Panel: Recent Labs  Lab 10/10/20 1446 10/11/20 0729 10/12/20 0554  NA 137 138 137  K 3.9 4.0 3.7  CL 102 103 104  CO2 26 27 23   GLUCOSE 125* 96 116*  BUN 20 16 11   CREATININE 1.25* 1.03 0.92  CALCIUM 8.4* 7.9* 7.9*  MG  --   --  1.7   GFR: Estimated Creatinine Clearance: 102.3 mL/min (by C-G formula based on SCr of 0.92 mg/dL). Liver Function Tests: Recent Labs  Lab 10/10/20 1446 10/11/20 0729  AST 28 28  ALT 30 36  ALKPHOS 44 38  BILITOT 1.2 1.1  PROT 6.1* 5.6*  ALBUMIN 3.6 3.1*   Recent Labs  Lab 10/10/20 1446  LIPASE 37   No results for input(s): AMMONIA in the last 168 hours. Coagulation Profile: Recent Labs  Lab 10/10/20 1446 10/11/20 0729  INR 1.4* 1.4*   Cardiac Enzymes: No results for input(s): CKTOTAL, CKMB, CKMBINDEX, TROPONINI in the last 168 hours. BNP (last 3 results) No results for input(s): PROBNP in the last 8760 hours. HbA1C: No results for input(s): HGBA1C in the last 72 hours. CBG: No results for input(s): GLUCAP in the last 168 hours. Lipid Profile: No results for input(s): CHOL, HDL, LDLCALC, TRIG, CHOLHDL, LDLDIRECT in the last 72 hours. Thyroid Function Tests: No results for input(s): TSH, T4TOTAL, FREET4, T3FREE, THYROIDAB in the last 72 hours. Anemia Panel: No results for input(s): VITAMINB12, FOLATE, FERRITIN, TIBC, IRON,  RETICCTPCT in the last 72 hours. Sepsis Labs: Recent Labs  Lab 10/10/20 1446 10/10/20 1632 10/11/20 0729  PROCALCITON  --   --  21.54  LATICACIDVEN 2.4* 1.8  --     Recent Results (from the past 240 hour(s))  Blood Culture (routine x 2)     Status: None (Preliminary result)   Collection Time: 10/10/20  2:46 PM   Specimen: BLOOD  Result Value Ref Range Status   Specimen Description   Final    BLOOD RIGHT ANTECUBITAL Performed at [facility] [address] [place], [place] [address]    Special Requests   Final    BOTTLES DRAWN AEROBIC AND ANAEROBIC Blood Culture adequate volume Performed at [facility] [address] [place], [place] [address]    Culture   Final    NO GROWTH < 24 HOURS Performed at [facility] [address] [place], [place] [address]  Report Status PENDING  Incomplete  Resp Panel by RT-PCR (Flu A&B, Covid) Nasopharyngeal Swab     Status: None   Collection Time: 10/10/20  2:50 PM   Specimen: Nasopharyngeal Swab; Nasopharyngeal(NP) swabs in vial transport medium  Result Value Ref Range Status   SARS Coronavirus 2 by RT PCR NEGATIVE NEGATIVE Final    Comment: (NOTE) SARS-CoV-2 target nucleic acids are NOT DETECTED.  The SARS-CoV-2 RNA is generally detectable in upper respiratory specimens during the acute phase of infection. The lowest concentration of SARS-CoV-2 viral copies this assay can detect is 138 copies/mL. A negative result does not preclude SARS-Cov-2 infection and should not be used as the sole basis for treatment or other patient management decisions. A negative result may occur with  improper specimen collection/handling, submission of specimen other than nasopharyngeal swab, presence of viral mutation(s) within the areas targeted by this assay, and inadequate number of viral copies(<138 copies/mL). A negative result must be combined with clinical observations, patient history, and  epidemiological information. The expected result is Negative.  Fact Sheet for Patients:  EntrepreneurPulse.com.au  Fact Sheet for Healthcare Providers:  IncredibleEmployment.be  This test is no t yet approved or cleared by the [place] FDA and  has been authorized for detection and/or diagnosis of SARS-CoV-2 by FDA under an Emergency Use Authorization (EUA). This EUA will remain  in effect (meaning this test can be used) for the duration of the COVID-19 declaration under Section 564(b)(1) of the Act, 21 U.S.C.section 360bbb-3(b)(1), unless the authorization is terminated  or revoked sooner.       Influenza A by PCR NEGATIVE NEGATIVE Final   Influenza B by PCR NEGATIVE NEGATIVE Final    Comment: (NOTE) The Xpert Xpress SARS-CoV-2/FLU/RSV plus assay is intended as an aid in the diagnosis of influenza from Nasopharyngeal swab specimens and should not be used as a sole basis for treatment. Nasal washings and aspirates are unacceptable for Xpert Xpress SARS-CoV-2/FLU/RSV testing.  Fact Sheet for Patients: EntrepreneurPulse.com.au  Fact Sheet for Healthcare Providers: IncredibleEmployment.be  This test is not yet approved or cleared by the [place] FDA and has been authorized for detection and/or diagnosis of SARS-CoV-2 by FDA under an Emergency Use Authorization (EUA). This EUA will remain in effect (meaning this test can be used) for the duration of the COVID-19 declaration under Section 564(b)(1) of the Act, 21 U.S.C. section 360bbb-3(b)(1), unless the authorization is terminated or revoked.  Performed at [facility], [place] [address], [place], [place] [address]   Blood Culture (routine x 2)     Status: None (Preliminary result)   Collection Time: 10/10/20  3:00 PM   Specimen: BLOOD RIGHT HAND  Result Value Ref Range Status   Specimen Description   Final    BLOOD RIGHT  HAND Performed at [facility] [address] [place], [place] [address]    Special Requests   Final    BOTTLES DRAWN AEROBIC AND ANAEROBIC Blood Culture adequate volume Performed at [facility] [address], [place], [place] [address]    Culture   Final    NO GROWTH < 12 HOURS Performed at [facility] [address] [place], [place] [address]    Report Status PENDING  Incomplete         Radiology Studies: DG Chest Port 1 View  Result Date: 10/10/2020 CLINICAL DATA:  Sepsis EXAM: PORTABLE CHEST 1 VIEW COMPARISON:  02/08/2020 FINDINGS: Prior median sternotomy and cardiac valve replacement.  Stable cardiomediastinal contours. No focal airspace consolidation, pleural effusion, or pneumothorax. IMPRESSION: No acute cardiopulmonary findings. Electronically Signed   By: Davina Poke D.O.   On: 10/10/2020 14:52   CT Angio Chest/Abd/Pel for Dissection W and/or W/WO  Result Date: 10/10/2020 CLINICAL DATA:  Thoracic aortic aneurysm suspected. Left-sided flank pain. EXAM: CT ANGIOGRAPHY CHEST, ABDOMEN AND PELVIS TECHNIQUE: Non-contrast CT of the chest was initially obtained. Multidetector CT imaging through the chest, abdomen and pelvis was performed using the standard protocol during bolus administration of intravenous contrast. Multiplanar reconstructed images and MIPs were obtained and reviewed to evaluate the vascular anatomy. CONTRAST:  135mL OMNIPAQUE IOHEXOL 350 MG/ML SOLN COMPARISON:  Mar 08, 2018. FINDINGS: CTA CHEST FINDINGS Cardiovascular: There are postsurgical changes of the thorax related to prior aortic valve replacement. The heart size is relatively normal. There is no evidence for thoracic aortic dissection or aneurysm. There is no significant pericardial effusion. Minimal atherosclerotic changes are noted of the thoracic aorta. There is no large centrally located pulmonary embolism. Mediastinum/Nodes: -- No mediastinal  lymphadenopathy. -- No hilar lymphadenopathy. -- No axillary lymphadenopathy. -- No supraclavicular lymphadenopathy. -- Normal thyroid gland where visualized. -  Unremarkable esophagus. Lungs/Pleura: Airways are patent. No pleural effusion, lobar consolidation, pneumothorax or pulmonary infarction. Musculoskeletal: No chest wall abnormality. No bony spinal canal stenosis. Review of the MIP images confirms the above findings. CTA ABDOMEN AND PELVIS FINDINGS VASCULAR Aorta: Normal caliber aorta without aneurysm, dissection, vasculitis or significant stenosis. Celiac: Patent without evidence of aneurysm, dissection, vasculitis or significant stenosis. SMA: Patent without evidence of aneurysm, dissection, vasculitis or significant stenosis. Renals: Both renal arteries are patent without evidence of aneurysm, dissection, vasculitis, fibromuscular dysplasia or significant stenosis. IMA: Patent without evidence of aneurysm, dissection, vasculitis or significant stenosis. Inflow: Patent without evidence of aneurysm, dissection, vasculitis or significant stenosis. Veins: No obvious venous abnormality within the limitations of this arterial phase study. Review of the MIP images confirms the above findings. NON-VASCULAR Hepatobiliary: The liver is normal. Status post cholecystectomy.There is no biliary ductal dilation. Pancreas: Normal contours without ductal dilatation. No peripancreatic fluid collection. Spleen: The spleen is borderline enlarged measuring approximately 13 cm craniocaudad. Adrenals/Urinary Tract: --Adrenal glands: Unremarkable. --Right kidney/ureter: No hydronephrosis or radiopaque kidney stones. --Left kidney/ureter: No hydronephrosis or radiopaque kidney stones. --Urinary bladder: Unremarkable. Stomach/Bowel: --Stomach/Duodenum: No hiatal hernia or other gastric abnormality. Normal duodenal course and caliber. --Small bowel: Unremarkable. --Colon: There is diffuse sigmoid diverticulosis. There is wall  thickening of the sigmoid colon with adjacent mild inflammatory changes suggestive of acute uncomplicated diverticulitis. --Appendix: Normal. Vascular/Lymphatic: Atherosclerotic calcification is present within the non-aneurysmal abdominal aorta, without hemodynamically significant stenosis. --No retroperitoneal lymphadenopathy. --No mesenteric lymphadenopathy. --No pelvic or inguinal lymphadenopathy. Reproductive: The prostate gland is enlarged. Other: No ascites or free air. The abdominal wall is normal. Musculoskeletal. No acute displaced fractures. Review of the MIP images confirms the above findings. IMPRESSION: 1. There is no evidence for thoracic or abdominal aortic dissection or aneurysm. 2. Diffuse sigmoid diverticulosis with wall thickening of the sigmoid colon with adjacent mild inflammatory changes suggestive of acute uncomplicated diverticulitis. As an underlying mass cannot be excluded, follow-up with an outpatient colonoscopy is recommended. 3. Borderline splenomegaly. 4. Enlarged prostate gland. Aortic Atherosclerosis (ICD10-I70.0). Electronically Signed   By: Constance Holster M.D.   On: 10/10/2020 17:12        Scheduled Meds: . colchicine  0.6 mg Oral Daily  . finasteride  5 mg Oral Daily  . levothyroxine  112 mcg Oral Q0600  .  liothyronine  25 mcg Oral Q0600  . losartan  50 mg Oral Daily  . metoprolol tartrate  50 mg Oral BID  . metroNIDAZOLE  500 mg Oral Q8H  . pantoprazole  40 mg Oral Daily  . rivaroxaban  20 mg Oral Q supper  . tamsulosin  0.4 mg Oral QPC supper  . venlafaxine XR  150 mg Oral Q breakfast   Continuous Infusions: . cefTRIAXone (ROCEPHIN)  IV 2 g (10/11/20 2109)  . lactated ringers 75 mL/hr at 10/11/20 1124          Dick Hark Starla Link, MD [facility] 10/12/2020, 11:25 AM

## 2020-10-12 NOTE — Care Management Important Message (Signed)
Important Message  Patient Details IM Letter given to the Patient. Name: Grant [name] MRN: [id number] Date of Birth: 08/31/1951   Medicare Important Message Given:  Yes     Kerin Salen 10/12/2020, 9:31 AM

## 2020-10-13 DIAGNOSIS — G4733 Obstructive sleep apnea (adult) (pediatric): Secondary | ICD-10-CM | POA: Diagnosis not present

## 2020-10-13 DIAGNOSIS — A419 Sepsis, unspecified organism: Secondary | ICD-10-CM | POA: Diagnosis not present

## 2020-10-13 DIAGNOSIS — I10 Essential (primary) hypertension: Secondary | ICD-10-CM | POA: Diagnosis not present

## 2020-10-13 DIAGNOSIS — Z7901 Long term (current) use of anticoagulants: Secondary | ICD-10-CM | POA: Diagnosis not present

## 2020-10-13 DIAGNOSIS — R5382 Chronic fatigue, unspecified: Secondary | ICD-10-CM | POA: Diagnosis not present

## 2020-10-13 DIAGNOSIS — K5792 Diverticulitis of intestine, part unspecified, without perforation or abscess without bleeding: Secondary | ICD-10-CM | POA: Diagnosis not present

## 2020-10-13 DIAGNOSIS — F419 Anxiety disorder, unspecified: Secondary | ICD-10-CM | POA: Diagnosis not present

## 2020-10-13 DIAGNOSIS — N400 Enlarged prostate without lower urinary tract symptoms: Secondary | ICD-10-CM | POA: Diagnosis not present

## 2020-10-13 DIAGNOSIS — M797 Fibromyalgia: Secondary | ICD-10-CM | POA: Diagnosis not present

## 2020-10-13 DIAGNOSIS — M1990 Unspecified osteoarthritis, unspecified site: Secondary | ICD-10-CM | POA: Diagnosis not present

## 2020-10-13 DIAGNOSIS — J42 Unspecified chronic bronchitis: Secondary | ICD-10-CM | POA: Diagnosis not present

## 2020-10-13 DIAGNOSIS — K219 Gastro-esophageal reflux disease without esophagitis: Secondary | ICD-10-CM | POA: Diagnosis not present

## 2020-10-13 DIAGNOSIS — J45909 Unspecified asthma, uncomplicated: Secondary | ICD-10-CM | POA: Diagnosis not present

## 2020-10-13 DIAGNOSIS — K5732 Diverticulitis of large intestine without perforation or abscess without bleeding: Secondary | ICD-10-CM | POA: Diagnosis not present

## 2020-10-13 DIAGNOSIS — I480 Paroxysmal atrial fibrillation: Secondary | ICD-10-CM | POA: Diagnosis not present

## 2020-10-13 DIAGNOSIS — Z6841 Body Mass Index (BMI) 40.0 and over, adult: Secondary | ICD-10-CM | POA: Diagnosis not present

## 2020-10-13 DIAGNOSIS — Z20822 Contact with and (suspected) exposure to covid-19: Secondary | ICD-10-CM | POA: Diagnosis not present

## 2020-10-13 DIAGNOSIS — F32A Depression, unspecified: Secondary | ICD-10-CM | POA: Diagnosis not present

## 2020-10-13 DIAGNOSIS — E039 Hypothyroidism, unspecified: Secondary | ICD-10-CM | POA: Diagnosis not present

## 2020-10-13 LAB — BASIC METABOLIC PANEL
Anion gap: 9 (ref 5–15)
BUN: 11 mg/dL (ref 8–23)
CO2: 25 mmol/L (ref 22–32)
Calcium: 8.3 mg/dL — ABNORMAL LOW (ref 8.9–10.3)
Chloride: 105 mmol/L (ref 98–111)
Creatinine, Ser: 1.11 mg/dL (ref 0.61–1.24)
GFR, Estimated: >60 mL/min (ref >60)
Glucose, Bld: 107 mg/dL — ABNORMAL HIGH (ref 70–99)
Potassium: 4.4 mmol/L (ref 3.5–5.1)
Sodium: 139 mmol/L (ref 135–145)

## 2020-10-13 LAB — CBC WITH DIFFERENTIAL/PLATELET
Abs Immature Granulocytes: 0.07 10*3/uL (ref 0.00–0.07)
Basophils Absolute: 0 10*3/uL (ref 0.0–0.1)
Basophils Relative: 1 %
Eosinophils Absolute: 0.3 10*3/uL (ref 0.0–0.5)
Eosinophils Relative: 3 %
HCT: 45.5 % (ref 39.0–52.0)
Hemoglobin: 14.8 g/dL (ref 13.0–17.0)
Immature Granulocytes: 1 %
Lymphocytes Relative: 13 %
Lymphs Abs: 1 10*3/uL (ref 0.7–4.0)
MCH: 32.2 pg (ref 26.0–34.0)
MCHC: 32.5 g/dL (ref 30.0–36.0)
MCV: 99.1 fL (ref 80.0–100.0)
Monocytes Absolute: 1.3 10*3/uL — ABNORMAL HIGH (ref 0.1–1.0)
Monocytes Relative: 16 %
Neutro Abs: 5.4 10*3/uL (ref 1.7–7.7)
Neutrophils Relative %: 66 %
Platelets: 128 10*3/uL — ABNORMAL LOW (ref 150–400)
RBC: 4.59 MIL/uL (ref 4.22–5.81)
RDW: 13 % (ref 11.5–15.5)
WBC: 8.1 10*3/uL (ref 4.0–10.5)
nRBC: 0 % (ref 0.0–0.2)

## 2020-10-13 LAB — MAGNESIUM: Magnesium: 1.8 mg/dL (ref 1.7–2.4)

## 2020-10-13 LAB — C-REACTIVE PROTEIN: CRP: 5.6 mg/dL — ABNORMAL HIGH (ref <1.0)

## 2020-10-13 MED ORDER — METRONIDAZOLE 500 MG PO TABS: 500.0000 mg | ORAL_TABLET | Freq: Three times a day (TID) | ORAL | 0 refills | Status: AC

## 2020-10-13 MED ORDER — TRAMADOL HCL 50 MG PO TABS: 50.0000 mg | ORAL_TABLET | Freq: Four times a day (QID) | ORAL | 0 refills | Status: DC | PRN

## 2020-10-13 MED ORDER — ALPRAZOLAM 0.25 MG PO TABS: 0.2500 mg | ORAL_TABLET | Freq: Three times a day (TID) | ORAL | 0 refills | Status: AC | PRN

## 2020-10-13 MED ORDER — CIPROFLOXACIN HCL 500 MG PO TABS: 500.0000 mg | ORAL_TABLET | Freq: Two times a day (BID) | ORAL | 0 refills | Status: AC

## 2020-10-13 NOTE — Discharge Summary (Signed)
Physician Discharge Summary  YASIR REICHMANN [id number] DOB: [birth year] DOA: 10/10/2020  PCP: Shirline Frees, MD  Admit date: 10/10/2020 Discharge date: 10/13/2020  Admitted From: Home Disposition: Home  Recommendations for Outpatient Follow-up:  1. Follow up with PCP in 1 week with repeat CBC/BMP 2. Outpatient follow-up with GI 3. Recommend outpatient follow-up by neurology 4. Follow up in ED if symptoms worsen or new appear   Home Health: No Equipment/Devices: None  Discharge Condition: Stable CODE STATUS: Full Diet recommendation: Heart healthy/soft diet  Brief/Interim Summary: 69 y.o.malewith medical history significant ofrecurrent diverticulitis, asthma, depression, status post aortic valve replacement in 2018, fibromyalgia, GERD, chronic migraines, obstructive sleep apnea, hypothyroidism and paroxysmal atrial fibrillation who presented to the ER with abdominal pain with nausea and an episode of vomiting.  On presentation, he had a temperature of 100.1, tachycardic, respiratory rate of 20 with white count of 16.2 and lactic acid of 2.4.  CT of the abdomen and pelvis showed sigmoid diverticulitis.  He was admitted for sepsis secondary to sigmoid diverticulitis and started on IV fluids and antibiotics. During the hospitalization, his condition has improved. He is tolerating diet with improving abdominal pain. He is hemodynamically stable with no increased leukocytosis. He will be discharged home on oral antibiotics.  Discharge Diagnoses:  Sepsis: Present on admission Acute sigmoid diverticulitis Leukocytosis -Patient presented with a temperature of 100.1, tachycardic, respiratory rate of 20 with white count of 16.2, lactic acid of 2.4 most likely secondary to sigmoid diverticulitis as evident on CT of the abdomen and pelvis. -Afebrile over the last 24 hours. Leukocytosis has resolved.  CRP improving to 5.6 today. -Currently on Rocephin and Flagyl.   - He is tolerating  diet with improving abdominal pain. He is hemodynamically stable -Discharge home today on oral ciprofloxacin and Flagyl for 10 days. -will need outpatient follow-up with GI in 6 to 8 weeks time -Sepsis has resolved  Essential hypertension -Blood pressure stable.  Continue home regimen.  Hypothyroidism -Continue levothyroxine and liothyronine  Paroxysmal atrial fibrillation--rate mostly controlled.  Continue Xarelto  GERD -Continue Protonix  OSA -Continue CPAP at night  Morbid obesity -Outpatient follow-up  ?  Complicated migraine history versus panic attack versus?  Seizure -Patient has history of abnormal body movements specially in stressful condition and has followed up with neurology in the past who thinks that patient has complicated migraine.  Patient needed to have IV Ativan during the hospitalization and wife thought that he was panicking and improved with Ativan.  Will need outpatient neurology follow-up.  Will not start any seizure medications for now.  Continue as needed triptan -will send home on few days of oral Xanax as needed  Discharge Instructions  Discharge Instructions    Ambulatory referral to Neurology   Complete by: As directed    An appointment is requested in approximately: 1-2 weeks for ?seizure/complicated migraine   Diet - low sodium heart healthy   Complete by: As directed    Soft diet   Increase activity slowly   Complete by: As directed      Allergies as of 10/13/2020      Reactions   Perphenazine Other (See Comments)   Tongue swelling   Amiodarone Swelling   Codeine Itching   Tramadol Other (See Comments)   dellusion      Medication List    STOP taking these medications   mupirocin ointment 2 % Commonly known as: BACTROBAN   predniSONE 10 MG tablet Commonly known as: DELTASONE  TAKE these medications   acetaminophen 500 MG tablet Commonly known as: TYLENOL Take 1,000 mg by mouth every 8 (eight) hours as needed for  mild pain, fever or headache. Per bottle as needed   Aimovig 140 MG/ML Soaj Generic drug: Erenumab-aooe Inject 140 mg into the skin every 30 (thirty) days.   albuterol 108 (90 Base) MCG/ACT inhaler Commonly known as: Ventolin HFA INHALE 2 PUFFS INTO THE LUNGS EVERY 6 HOURS AS NEEDED FOR WHEEZING OR SHORTNESS OF BREATH What changed:   how much to take  how to take this  when to take this  reasons to take this  additional instructions   ALPRAZolam 0.25 MG tablet Commonly known as: XANAX Take 1 tablet (0.25 mg total) by mouth 3 (three) times daily as needed for anxiety.   amoxicillin 500 MG capsule Commonly known as: AMOXIL Take 4 capsules (2,000 mg total) by mouth See admin instructions. 1 hour prior to dental procedure   azelastine 0.1 % nasal spray Commonly known as: ASTELIN Place 1-2 sprays into the nose 2 (two) times daily as needed for rhinitis or allergies.   benzonatate 200 MG capsule Commonly known as: TESSALON TAKE 1 CAPSULE BY MOUTH EVERY 8 HOURS AS NEEDED FOR COUGH What changed: See the new instructions.   BLUE-EMU MAXIMUM STRENGTH EX Apply 1 application topically as needed (pain).   chlorpheniramine 4 MG tablet Commonly known as: CHLOR-TRIMETON Take 4 mg by mouth as needed for allergies.   ciprofloxacin 500 MG tablet Commonly known as: Cipro Take 1 tablet (500 mg total) by mouth 2 (two) times daily for 10 days.   Colcrys 0.6 MG tablet Generic drug: colchicine Take 0.6 mg by mouth daily.   DHEA 50 MG Tabs Take 50 mg by mouth daily with breakfast.   EPINEPHrine 0.3 mg/0.3 mL Soaj injection Commonly known as: EPI-PEN Inject 0.3 mLs (0.3 mg total) into the muscle as needed (for angioedema).   finasteride 5 MG tablet Commonly known as: PROSCAR Take 5 mg by mouth at bedtime.   Iron 142 (45 Fe) MG Tbcr Take 142 mg by mouth in the morning and at bedtime.   levothyroxine 112 MCG tablet Commonly known as: SYNTHROID Take 112 mcg by mouth daily  before breakfast.   liothyronine 25 MCG tablet Commonly known as: CYTOMEL Take 25 mcg by mouth daily.   losartan 50 MG tablet Commonly known as: COZAAR TAKE 1 TABLET BY MOUTH EVERY DAY   metoprolol tartrate 50 MG tablet Commonly known as: LOPRESSOR Take 1 tablet (50 mg total) by mouth 2 (two) times daily.   metroNIDAZOLE 500 MG tablet Commonly known as: FLAGYL Take 1 tablet (500 mg total) by mouth 3 (three) times daily for 10 days.   MSM Powd Take 4 g by mouth daily.   Mucinex DM Maximum Strength 60-1200 MG Tb12 Take 1 tablet by mouth 2 (two) times daily as needed (congestion/cough).   multivitamin with minerals Tabs tablet Take 1 tablet by mouth daily.   naproxen sodium 220 MG tablet Commonly known as: ALEVE Take 220-440 mg by mouth 2 (two) times daily as needed (pain/headache).   omeprazole 40 MG capsule Commonly known as: PRILOSEC Take 1 capsule (40 mg total) by mouth 2 (two) times daily.   Pataday 0.1 % ophthalmic solution Generic drug: olopatadine Place 1 drop into both eyes 2 (two) times daily as needed for allergies.   rivaroxaban 20 MG Tabs tablet Commonly known as: Xarelto Take 1 tablet (20 mg total) by mouth daily with supper.  rizatriptan 10 MG tablet Commonly known as: MAXALT Take 1 tablet (10 mg total) by mouth as needed for migraine. May repeat in 2 hours if needed   Symbicort 160-4.5 MCG/ACT inhaler Generic drug: budesonide-formoterol INHALE 2 PUFFS INTO THE LUNGS TWICE DAILY   tamsulosin 0.4 MG Caps capsule Commonly known as: FLOMAX Take 0.4 mg by mouth daily after supper.   testosterone cypionate 200 MG/ML injection Commonly known as: DEPOTESTOSTERONE CYPIONATE Inject 100 mg into the muscle once a week.   traMADol 50 MG tablet Commonly known as: Ultram Take 1 tablet (50 mg total) by mouth every 6 (six) hours as needed for moderate pain.   venlafaxine XR 150 MG 24 hr capsule Commonly known as: EFFEXOR-XR Take 150 mg by mouth daily with  breakfast.       Follow-up Information    Shirline Frees, MD. Schedule an appointment as soon as possible for a visit in 1 week(s).   Specialty: Family Medicine Why: with repeat cbc/bmp Contact information: [place] [address] [phone]        Clarene Essex, MD. Schedule an appointment as soon as possible for a visit in 4 week(s).   Specialty: Gastroenterology Contact information: [address] [place] [address] [phone]              Allergies  Allergen Reactions  . Perphenazine Other (See Comments)    Tongue swelling  . Amiodarone Swelling  . Codeine Itching  . Tramadol Other (See Comments)    dellusion     Consultations:  None   Procedures/Studies: DG Chest Port 1 View  Result Date: 10/10/2020 CLINICAL DATA:  Sepsis EXAM: PORTABLE CHEST 1 VIEW COMPARISON:  02/08/2020 FINDINGS: Prior median sternotomy and cardiac valve replacement. Stable cardiomediastinal contours. No focal airspace consolidation, pleural effusion, or pneumothorax. IMPRESSION: No acute cardiopulmonary findings. Electronically Signed   By: Davina Poke D.O.   On: 10/10/2020 14:52   CT Angio Chest/Abd/Pel for Dissection W and/or W/WO  Result Date: 10/10/2020 CLINICAL DATA:  Thoracic aortic aneurysm suspected. Left-sided flank pain. EXAM: CT ANGIOGRAPHY CHEST, ABDOMEN AND PELVIS TECHNIQUE: Non-contrast CT of the chest was initially obtained. Multidetector CT imaging through the chest, abdomen and pelvis was performed using the standard protocol during bolus administration of intravenous contrast. Multiplanar reconstructed images and MIPs were obtained and reviewed to evaluate the vascular anatomy. CONTRAST:  16mL OMNIPAQUE IOHEXOL 350 MG/ML SOLN COMPARISON:  Mar 08, 2018. FINDINGS: CTA CHEST FINDINGS Cardiovascular: There are postsurgical changes of the thorax related to prior aortic valve replacement. The heart size is relatively  normal. There is no evidence for thoracic aortic dissection or aneurysm. There is no significant pericardial effusion. Minimal atherosclerotic changes are noted of the thoracic aorta. There is no large centrally located pulmonary embolism. Mediastinum/Nodes: -- No mediastinal lymphadenopathy. -- No hilar lymphadenopathy. -- No axillary lymphadenopathy. -- No supraclavicular lymphadenopathy. -- Normal thyroid gland where visualized. -  Unremarkable esophagus. Lungs/Pleura: Airways are patent. No pleural effusion, lobar consolidation, pneumothorax or pulmonary infarction. Musculoskeletal: No chest wall abnormality. No bony spinal canal stenosis. Review of the MIP images confirms the above findings. CTA ABDOMEN AND PELVIS FINDINGS VASCULAR Aorta: Normal caliber aorta without aneurysm, dissection, vasculitis or significant stenosis. Celiac: Patent without evidence of aneurysm, dissection, vasculitis or significant stenosis. SMA: Patent without evidence of aneurysm, dissection, vasculitis or significant stenosis. Renals: Both renal arteries are patent without evidence of aneurysm, dissection, vasculitis, fibromuscular dysplasia or significant stenosis. IMA: Patent without evidence  of aneurysm, dissection, vasculitis or significant stenosis. Inflow: Patent without evidence of aneurysm, dissection, vasculitis or significant stenosis. Veins: No obvious venous abnormality within the limitations of this arterial phase study. Review of the MIP images confirms the above findings. NON-VASCULAR Hepatobiliary: The liver is normal. Status post cholecystectomy.There is no biliary ductal dilation. Pancreas: Normal contours without ductal dilatation. No peripancreatic fluid collection. Spleen: The spleen is borderline enlarged measuring approximately 13 cm craniocaudad. Adrenals/Urinary Tract: --Adrenal glands: Unremarkable. --Right kidney/ureter: No hydronephrosis or radiopaque kidney stones. --Left kidney/ureter: No hydronephrosis  or radiopaque kidney stones. --Urinary bladder: Unremarkable. Stomach/Bowel: --Stomach/Duodenum: No hiatal hernia or other gastric abnormality. Normal duodenal course and caliber. --Small bowel: Unremarkable. --Colon: There is diffuse sigmoid diverticulosis. There is wall thickening of the sigmoid colon with adjacent mild inflammatory changes suggestive of acute uncomplicated diverticulitis. --Appendix: Normal. Vascular/Lymphatic: Atherosclerotic calcification is present within the non-aneurysmal abdominal aorta, without hemodynamically significant stenosis. --No retroperitoneal lymphadenopathy. --No mesenteric lymphadenopathy. --No pelvic or inguinal lymphadenopathy. Reproductive: The prostate gland is enlarged. Other: No ascites or free air. The abdominal wall is normal. Musculoskeletal. No acute displaced fractures. Review of the MIP images confirms the above findings. IMPRESSION: 1. There is no evidence for thoracic or abdominal aortic dissection or aneurysm. 2. Diffuse sigmoid diverticulosis with wall thickening of the sigmoid colon with adjacent mild inflammatory changes suggestive of acute uncomplicated diverticulitis. As an underlying mass cannot be excluded, follow-up with an outpatient colonoscopy is recommended. 3. Borderline splenomegaly. 4. Enlarged prostate gland. Aortic Atherosclerosis (ICD10-I70.0). Electronically Signed   By: Constance Holster M.D.   On: 10/10/2020 17:12   CT MAXILLOFACIAL LIMITED WO CONTRAST  Result Date: 09/19/2020 CLINICAL DATA:  Cough with sinus drainage EXAM: CT PARANASAL SINUS LIMITED WITHOUT CONTRAST TECHNIQUE: Non-contiguous multidetector CT images of the paranasal sinuses were obtained in a single plane without contrast. COMPARISON:  None. FINDINGS: Paranasal sinuses are clear. IMPRESSION: Clear paranasal sinuses. Electronically Signed   By: Ulyses Jarred M.D.   On: 09/19/2020 02:38       Subjective: Patient seen and examined at bedside. Wife present at bedside  as well. Patient denies any worsening abdominal pain, nausea, vomiting, fever. Tolerating diet. Feels okay to go home today.  Discharge Exam: Vitals:   10/12/20 2250 10/13/20 0604  BP: (!) 167/92 (!) 144/90  Pulse: 72 (!) 55  Resp: 20 20  Temp: 98.9 F (37.2 C) 97.6 F (36.4 C)  SpO2: 94% 93%    General: Pt is alert, awake, not in acute distress. Poor historian Cardiovascular: Mildly bradycardic, S1/S2 + Respiratory: bilateral decreased breath sounds at bases Abdominal: Soft, morbidly obese, NT, ND, bowel sounds + Extremities: Trace lower extremity edema; no cyanosis    The results of significant diagnostics from this hospitalization (including imaging, microbiology, ancillary and laboratory) are listed below for reference.     Microbiology: Recent Results (from the past 240 hour(s))  Blood Culture (routine x 2)     Status: None (Preliminary result)   Collection Time: 10/10/20  2:46 PM   Specimen: BLOOD  Result Value Ref Range Status   Specimen Description   Final    BLOOD RIGHT ANTECUBITAL Performed at [facility] [address], [place], [place] [address]    Special Requests   Final    BOTTLES DRAWN AEROBIC AND ANAEROBIC Blood Culture adequate volume Performed at [facility] [address] [place], [place] [address]    Culture   Final    NO GROWTH 2 DAYS Performed at [facility], [place] [address], [place], [place] [address]    Report Status PENDING  Incomplete  Resp Panel by RT-PCR (Flu A&B, Covid) Nasopharyngeal Swab     Status: None   Collection Time: 10/10/20  2:50 PM   Specimen: Nasopharyngeal Swab; Nasopharyngeal(NP) swabs in vial transport medium  Result Value Ref Range Status   SARS Coronavirus 2 by RT PCR NEGATIVE NEGATIVE Final    Comment: (NOTE) SARS-CoV-2 target nucleic acids are NOT DETECTED.  The SARS-CoV-2 RNA is generally detectable in upper respiratory specimens during the acute phase of infection.  The lowest concentration of SARS-CoV-2 viral copies this assay can detect is 138 copies/mL. A negative result does not preclude SARS-Cov-2 infection and should not be used as the sole basis for treatment or other patient management decisions. A negative result may occur with  improper specimen collection/handling, submission of specimen other than nasopharyngeal swab, presence of viral mutation(s) within the areas targeted by this assay, and inadequate number of viral copies(<138 copies/mL). A negative result must be combined with clinical observations, patient history, and epidemiological information. The expected result is Negative.  Fact Sheet for Patients:  EntrepreneurPulse.com.au  Fact Sheet for Healthcare Providers:  IncredibleEmployment.be  This test is no t yet approved or cleared by the [place] FDA and  has been authorized for detection and/or diagnosis of SARS-CoV-2 by FDA under an Emergency Use Authorization (EUA). This EUA will remain  in effect (meaning this test can be used) for the duration of the COVID-19 declaration under Section 564(b)(1) of the Act, 21 U.S.C.section 360bbb-3(b)(1), unless the authorization is terminated  or revoked sooner.       Influenza A by PCR NEGATIVE NEGATIVE Final   Influenza B by PCR NEGATIVE NEGATIVE Final    Comment: (NOTE) The Xpert Xpress SARS-CoV-2/FLU/RSV plus assay is intended as an aid in the diagnosis of influenza from Nasopharyngeal swab specimens and should not be used as a sole basis for treatment. Nasal washings and aspirates are unacceptable for Xpert Xpress SARS-CoV-2/FLU/RSV testing.  Fact Sheet for Patients: EntrepreneurPulse.com.au  Fact Sheet for Healthcare Providers: IncredibleEmployment.be  This test is not yet approved or cleared by the [place] FDA and has been authorized for detection and/or diagnosis of SARS-CoV-2 by FDA  under an Emergency Use Authorization (EUA). This EUA will remain in effect (meaning this test can be used) for the duration of the COVID-19 declaration under Section 564(b)(1) of the Act, 21 U.S.C. section 360bbb-3(b)(1), unless the authorization is terminated or revoked.  Performed at [facility], [place] [address] [place], [place] [address]   Blood Culture (routine x 2)     Status: None (Preliminary result)   Collection Time: 10/10/20  3:00 PM   Specimen: BLOOD RIGHT HAND  Result Value Ref Range Status   Specimen Description   Final    BLOOD RIGHT HAND Performed at [facility] [address], [place], [place] [address]    Special Requests   Final    BOTTLES DRAWN AEROBIC AND ANAEROBIC Blood Culture adequate volume Performed at [facility] [address] [place], [place] [address]    Culture   Final    NO GROWTH 2 DAYS Performed at [facility] [address] [place], [place] [address]    Report Status PENDING  Incomplete     Labs: BNP (last 3 results) No results for input(s): BNP in the last 8760 hours. Basic Metabolic Panel: Recent Labs  Lab 10/10/20  1446 10/11/20 0729 10/12/20 0554 10/13/20 0550  NA 137 138 137 139  K 3.9 4.0 3.7 4.4  CL 102 103 104 105  CO2 26 27 23 25   GLUCOSE 125* 96 116* 107*  BUN 20 16 11 11   CREATININE 1.25* 1.03 0.92 1.11  CALCIUM 8.4* 7.9* 7.9* 8.3*  MG  --   --  1.7 1.8   Liver Function Tests: Recent Labs  Lab 10/10/20 1446 10/11/20 0729  AST 28 28  ALT 30 36  ALKPHOS 44 38  BILITOT 1.2 1.1  PROT 6.1* 5.6*  ALBUMIN 3.6 3.1*   Recent Labs  Lab 10/10/20 1446  LIPASE 37   No results for input(s): AMMONIA in the last 168 hours. CBC: Recent Labs  Lab 10/10/20 1446 10/11/20 0729 10/12/20 0554 10/13/20 0550  WBC 16.2* 12.8* 8.7 8.1  NEUTROABS  --   --  6.8 5.4  HGB 17.4* 15.0 15.3 14.8  HCT 51.2 46.8 45.8 45.5  MCV 96.8 101.1* 97.9 99.1  PLT 127*  102* 109* 128*   Cardiac Enzymes: No results for input(s): CKTOTAL, CKMB, CKMBINDEX, TROPONINI in the last 168 hours. BNP: Invalid input(s): POCBNP CBG: No results for input(s): GLUCAP in the last 168 hours. D-Dimer No results for input(s): DDIMER in the last 72 hours. Hgb A1c No results for input(s): HGBA1C in the last 72 hours. Lipid Profile No results for input(s): CHOL, HDL, LDLCALC, TRIG, CHOLHDL, LDLDIRECT in the last 72 hours. Thyroid function studies No results for input(s): TSH, T4TOTAL, T3FREE, THYROIDAB in the last 72 hours.  Invalid input(s): FREET3 Anemia work up No results for input(s): VITAMINB12, FOLATE, FERRITIN, TIBC, IRON, RETICCTPCT in the last 72 hours. Urinalysis    Component Value Date/Time   COLORURINE YELLOW 02/16/2019 1720   APPEARANCEUR CLEAR 02/16/2019 1720   LABSPEC 1.010 02/16/2019 1720   PHURINE 5.0 02/16/2019 1720   GLUCOSEU NEGATIVE 02/16/2019 1720   HGBUR NEGATIVE 02/16/2019 1720   BILIRUBINUR NEGATIVE 02/16/2019 1720   KETONESUR NEGATIVE 02/16/2019 1720   PROTEINUR NEGATIVE 02/16/2019 1720   UROBILINOGEN 0.2 01/11/2008 1105   NITRITE NEGATIVE 02/16/2019 1720   LEUKOCYTESUR NEGATIVE 02/16/2019 1720   Sepsis Labs Invalid input(s): PROCALCITONIN,  WBC,  LACTICIDVEN Microbiology Recent Results (from the past 240 hour(s))  Blood Culture (routine x 2)     Status: None (Preliminary result)   Collection Time: 10/10/20  2:46 PM   Specimen: BLOOD  Result Value Ref Range Status   Specimen Description   Final    BLOOD RIGHT ANTECUBITAL Performed at [facility], [place] [address], [place], [place] [address]    Special Requests   Final    BOTTLES DRAWN AEROBIC AND ANAEROBIC Blood Culture adequate volume Performed at [facility] [address], [place], [place] [address]    Culture   Final    NO GROWTH 2 DAYS Performed at [facility] [address], [place], [place] [address]    Report Status  PENDING  Incomplete  Resp Panel by RT-PCR (Flu A&B, Covid) Nasopharyngeal Swab     Status: None   Collection Time: 10/10/20  2:50 PM   Specimen: Nasopharyngeal Swab; Nasopharyngeal(NP) swabs in vial transport medium  Result Value Ref Range Status   SARS Coronavirus 2 by RT PCR NEGATIVE NEGATIVE Final    Comment: (NOTE) SARS-CoV-2 target nucleic acids are NOT DETECTED.  The SARS-CoV-2 RNA is generally detectable in upper respiratory specimens during the acute phase of infection. The lowest concentration of SARS-CoV-2  viral copies this assay can detect is 138 copies/mL. A negative result does not preclude SARS-Cov-2 infection and should not be used as the sole basis for treatment or other patient management decisions. A negative result may occur with  improper specimen collection/handling, submission of specimen other than nasopharyngeal swab, presence of viral mutation(s) within the areas targeted by this assay, and inadequate number of viral copies(<138 copies/mL). A negative result must be combined with clinical observations, patient history, and epidemiological information. The expected result is Negative.  Fact Sheet for Patients:  EntrepreneurPulse.com.au  Fact Sheet for Healthcare Providers:  IncredibleEmployment.be  This test is no t yet approved or cleared by the [place] FDA and  has been authorized for detection and/or diagnosis of SARS-CoV-2 by FDA under an Emergency Use Authorization (EUA). This EUA will remain  in effect (meaning this test can be used) for the duration of the COVID-19 declaration under Section 564(b)(1) of the Act, 21 U.S.C.section 360bbb-3(b)(1), unless the authorization is terminated  or revoked sooner.       Influenza A by PCR NEGATIVE NEGATIVE Final   Influenza B by PCR NEGATIVE NEGATIVE Final    Comment: (NOTE) The Xpert Xpress SARS-CoV-2/FLU/RSV plus assay is intended as an aid in the diagnosis of  influenza from Nasopharyngeal swab specimens and should not be used as a sole basis for treatment. Nasal washings and aspirates are unacceptable for Xpert Xpress SARS-CoV-2/FLU/RSV testing.  Fact Sheet for Patients: EntrepreneurPulse.com.au  Fact Sheet for Healthcare Providers: IncredibleEmployment.be  This test is not yet approved or cleared by the [place] FDA and has been authorized for detection and/or diagnosis of SARS-CoV-2 by FDA under an Emergency Use Authorization (EUA). This EUA will remain in effect (meaning this test can be used) for the duration of the COVID-19 declaration under Section 564(b)(1) of the Act, 21 U.S.C. section 360bbb-3(b)(1), unless the authorization is terminated or revoked.  Performed at [facility], [place] [address] [place], [place] [address]   Blood Culture (routine x 2)     Status: None (Preliminary result)   Collection Time: 10/10/20  3:00 PM   Specimen: BLOOD RIGHT HAND  Result Value Ref Range Status   Specimen Description   Final    BLOOD RIGHT HAND Performed at [facility] [address], [place], [place] [address]    Special Requests   Final    BOTTLES DRAWN AEROBIC AND ANAEROBIC Blood Culture adequate volume Performed at [facility] [address] [place], [place] [address]    Culture   Final    NO GROWTH 2 DAYS Performed at [facility] [address] [place], [place] [id number]    Report Status PENDING  Incomplete     Time coordinating discharge: 35 minutes  SIGNED:   Aline August, MD  [facility] 10/13/2020, 10:14 AM

## 2020-10-13 NOTE — Discharge Instructions (Signed)
Diverticulitis  Diverticulitis is when small pockets in your large intestine (colon) get infected or swollen. This causes stomach pain and watery poop (diarrhea). These pouches are called diverticula. They form in people who have a condition called diverticulosis. Follow these instructions at home: Medicines  Take over-the-counter and prescription medicines only as told by your doctor. These include: ? Antibiotics. ? Pain medicines. ? Fiber pills. ? Probiotics. ? Stool softeners.  Do not drive or use heavy machinery while taking prescription pain medicine.  If you were prescribed an antibiotic, take it as told. Do not stop taking it even if you feel better. General instructions   Follow a diet as told by your doctor.  When you feel better, your doctor may tell you to change your diet. You may need to eat a lot of fiber. Fiber makes it easier to poop (have bowel movements). Healthy foods with fiber include: ? Berries. ? Beans. ? Lentils. ? Green vegetables.  Exercise 3 or more times a week. Aim for 30 minutes each time. Exercise enough to sweat and make your heart beat faster.  Keep all follow-up visits as told. This is important. You may need to have an exam of the large intestine. This is called a colonoscopy. Contact a doctor if:  Your pain does not get better.  You have a hard time eating or drinking.  You are not pooping like normal. Get help right away if:  Your pain gets worse.  Your problems do not get better.  Your problems get worse very fast.  You have a fever.  You throw up (vomit) more than one time.  You have poop that is: ? Bloody. ? Black. ? Tarry. Summary  Diverticulitis is when small pockets in your large intestine (colon) get infected or swollen.  Take medicines only as told by your doctor.  Follow a diet as told by your doctor. This information is not intended to replace advice given to you by your health care provider. Make sure you  discuss any questions you have with your health care provider. Document Revised: 09/18/2017 Document Reviewed: 10/23/2016 Elsevier Patient Education  2020 [place].   Managing Non-Epileptic Seizures, Adult Epileptic seizures are caused by abnormal electrical activity in the brain, but not all seizures are caused by epilepsy. Seizures that are not caused by epilepsy are called non-epileptic seizures, and there are two types:  Physiologic non-epileptic seizure. This is also called provoked seizure or organic seizure. This type of seizure stops when the cause goes away or is treated. Possible causes include: ? High fever. ? High or low blood sugar (glucose). ? Brain injury. ? Brain infection.  Psychogenic non-epileptic seizure (PNES). This can be caused by a mental disturbance (psychological distress), not by abnormal brain activity or brain injury. This may look similar to other types of seizures. A PNES may be called an "event" or "attack" instead of a seizure. Possible causes include: ? Stress. ? Major life events, such as divorce or death of a loved one. ? Post-traumatic stress disorder (PTSD). ? Physical or sexual abuse. ? Mental health disorders, including anxiety and depression. General treatment recommendations Physiologic non-epileptic seizure  If you have physiologic non-epileptic seizures, your health care provider will treat the cause. These seizures are not likely to return, and they do not need further treatment. PNES  Your health care provider may suspect PNES if: ? You have seizures that keep coming back (recurring) and do not respond to seizure medicines. ? You do not  show any abnormal brain activity during electrical brain activity testing (electroencephalogram, or EEG).  It is very important to understand that PNES is a real illness. It does not mean that the seizure is fake. It just means that the cause is different. PNES can be treated.  You may be referred to a  mental health specialist (psychiatrist). This is because PNES is a mental health disorder. Mental health treatment may include: ? Talk therapy (cognitive behavioral therapy, or CBT). This is the most effective treatment. Through CBT, you will learn to identify and manage the psychological distress that leads to seizures. ? Stress reduction and relaxation techniques. ? Family therapy. ? Medicines to treat depression or anxiety.  In many cases, knowing that seizures are not caused by epilepsy reduces or stops seizures. How to manage lifestyle changes Managing stress     Certain types of counseling can be very helpful for managing stress. A mental health professional can assess what other treatments may also help you, such as:  Cognitive behavioral therapy.  Medicine to treat depression or anxiety.  Biofeedback. This uses signals from your body (physiological responses) to help you learn to regulate anxiety.  Meditation.  Yoga. Consider self-care strategies to lower stress levels, such as:  Talking with a trusted friend or family member about your thoughts and feelings.  Muscle relaxation and breathing exercises.  Trying activities to relieve stress, such as: ? Deep breathing. ? Listening to music. ? Exercising or taking a walk. ? Writing in a journal. ? Company secretary.  Relationships Make sure family members, friends, and co-workers are trained in how to help you if you have a seizure. If you have a seizure, people near you should:  Lay you on the ground to prevent a fall.  Place a pillow or piece of clothing under your head.  Loosen any tight clothing, especially around your neck.  Turn you onto your side. This helps keep your airway clear if you vomit. Make sure people do not try to hold you down, keep you still, or put anything in your mouth during a seizure. Follow these instructions at home: [place] may be prescribed to treat depression or anxiety  that causes non-epileptic seizures. Avoid using alcohol and other substances that may prevent your medicines from working properly (may interact). It is also important to:  Talk with your pharmacist or health care provider about all the medicines that you take, their possible side effects, and what medicines are safe to take together.  Make it your goal to take part in all treatment decisions (shared decision-making). Ask about possible side effects of medicines that your health care provider recommends, and tell him or her how you feel about having those side effects. It is best if shared decision-making with your health care provider is part of your total treatment plan.  Take over-the-counter and prescription medicines only as told by your health care provider. General instructions  Ask your health care provider if it is safe for you to drive.  Return to your normal activities as told by your health care provider. Ask your health care provider what activities are safe for you.  Keep all follow-up visits as told by your health care providers. This is important. Where to find support You can get support for managing non-epileptic seizures from support groups, either online or in-person. Your health care provider may be able to recommend a support group in your area. Where to find more information  Epilepsy Foundation: www.epilepsy.com  American  Epilepsy Society: www.aesnet.org Contact a health care provider if:  Your seizures change or become more frequent.  You continue to have seizures after treatment. Get help right away if:  You injure yourself during a seizure.  You have: ? One seizure after another. ? Trouble recovering from a seizure. ? Chest pain or trouble breathing. ? A seizure that lasts longer than 5 minutes. These symptoms may represent a serious problem that is an emergency. Do not wait to see if the symptoms will go away. Get medical help right away. Call your local  emergency services (911 in the [place] Do not drive yourself to the hospital. Summary  Seizures that are not caused by epilepsy are called non-epileptic seizures. The two types of non-epileptic seizures are physiologic non-epileptic seizure and psychogenic non-epileptic seizure (PNES).  PNES is a treatable mental health disorder that is caused by psychological distress. It is very important to work with your mental health provider to find a treatment that works for you.  Learning to manage stress and anxiety is an important part of PNES treatment.  Make sure family members, friends, and co-workers are trained in how to help you if you have a seizure. If you have a seizure, they should lay you on the ground to prevent a fall, protect your head and neck, and turn you onto your side. This information is not intended to replace advice given to you by your health care provider. Make sure you discuss any questions you have with your health care provider. Document Revised: 10/18/2018 Document Reviewed: 06/02/2017 Elsevier Patient Education  [place].   Seizure, Adult A seizure is a sudden burst of abnormal electrical activity in the brain. Seizures usually last from 30 seconds to 2 minutes. They can cause many different symptoms. Usually, seizures are not harmful unless they last a long time. What are the causes? Common causes of this condition include:  Fever or infection.  Conditions that affect the brain, such as: ? A brain abnormality that you were born with. ? A brain or head injury. ? Bleeding in the brain. ? A tumor. ? Stroke. ? Brain disorders such as autism or cerebral palsy.  Low blood sugar.  Conditions that are passed from parent to child (are inherited).  Problems with substances, such as: ? Having a reaction to a drug or a medicine. ? Suddenly stopping the use of a substance (withdrawal). In some cases, the cause may not be known. A person who has repeated  seizures over time without a clear cause has a condition called epilepsy. What increases the risk? You are more likely to get this condition if you have:  A family history of epilepsy.  Had a seizure in the past.  A brain disorder.  A history of head injury, lack of oxygen at birth, or strokes. What are the signs or symptoms? There are many types of seizures. The symptoms vary depending on the type of seizure you have. Examples of symptoms during a seizure include:  Shaking (convulsions).  Stiffness in the body.  Passing out (losing consciousness).  Head nodding.  Staring.  Not responding to sound or touch.  Loss of bladder control and bowel control. Some people have symptoms right before and right after a seizure happens. Symptoms before a seizure may include:  Fear.  Worry (anxiety).  Feeling like you may vomit (nauseous).  Feeling like the room is spinning (vertigo).  Feeling like you saw or heard something before (dj vu).  Odd tastes  or smells.  Changes in how you see. You may see flashing lights or spots. Symptoms after a seizure happens can include:  Confusion.  Sleepiness.  Headache.  Weakness on one side of the body. How is this treated? Most seizures will stop on their own in under 5 minutes. In these cases, no treatment is needed. Seizures that last longer than 5 minutes will usually need treatment. Treatment can include:  Medicines given through an IV tube.  Avoiding things that are known to cause your seizures. These can include medicines that you take for another condition.  Medicines to treat epilepsy.  Surgery to stop the seizures. This may be needed if medicines do not help. Follow these instructions at home: Medicines  Take over-the-counter and prescription medicines only as told by your doctor.  Do not eat or drink anything that may keep your medicine from working, such as alcohol. Activity  Do not do any activities that would  be dangerous if you had another seizure, like driving or swimming. Wait until your doctor says it is safe for you to do them.  If you live in the [place] ask your local DMV (department of motor vehicles) when you can drive.  Get plenty of rest. Teaching others Teach friends and family what to do when you have a seizure. They should:  Lay you on the ground.  Protect your head and body.  Loosen any tight clothing around your neck.  Turn you on your side.  Not hold you down.  Not put anything into your mouth.  Know whether or not you need emergency care.  Stay with you until you are better.  General instructions  Contact your doctor each time you have a seizure.  Avoid anything that gives you seizures.  Keep a seizure diary. Write down: ? What you think caused each seizure. ? What you remember about each seizure.  Keep all follow-up visits as told by your doctor. This is important. Contact a doctor if:  You have another seizure.  You have seizures more often.  There is any change in what happens during your seizures.  You keep having seizures with treatment.  You have symptoms of being sick or having an infection. Get help right away if:  You have a seizure that: ? Lasts longer than 5 minutes. ? Is different than seizures you had before. ? Makes it harder to breathe. ? Happens after you hurt your head.  You have any of these symptoms after a seizure: ? Not being able to speak. ? Not being able to use a part of your body. ? Confusion. ? A bad headache.  You have two or more seizures in a row.  You do not wake up right after a seizure.  You get hurt during a seizure. These symptoms may be an emergency. Do not wait to see if the symptoms will go away. Get medical help right away. Call your local emergency services (911 in the [place] Do not drive yourself to the hospital. Summary  Seizures usually last from 30 seconds to 2 minutes. Usually, they are not  harmful unless they last a long time.  Do not eat or drink anything that may keep your medicine from working, such as alcohol.  Teach friends and family what to do when you have a seizure.  Contact your doctor each time you have a seizure. This information is not intended to replace advice given to you by your health care provider. Make sure you discuss  any questions you have with your health care provider. Document Revised: 12/24/2018 Document Reviewed: 12/24/2018 Elsevier Patient Education  [place].

## 2020-10-13 NOTE — Progress Notes (Signed)
Discharge instructions explained to patient's wife. Prescription called into [facility]. He took a shower before he left, with his wife's help.Discharged via wheelchair to his wife.

## 2020-10-15 LAB — CULTURE, BLOOD (ROUTINE X 2)
Culture: NO GROWTH
Culture: NO GROWTH
Special Requests: ADEQUATE
Special Requests: ADEQUATE

## 2020-10-16 DIAGNOSIS — K5792 Diverticulitis of intestine, part unspecified, without perforation or abscess without bleeding: Secondary | ICD-10-CM | POA: Diagnosis not present

## 2020-10-25 DIAGNOSIS — M9903 Segmental and somatic dysfunction of lumbar region: Secondary | ICD-10-CM | POA: Diagnosis not present

## 2020-10-25 DIAGNOSIS — M5031 Other cervical disc degeneration,  high cervical region: Secondary | ICD-10-CM | POA: Diagnosis not present

## 2020-10-25 DIAGNOSIS — M5136 Other intervertebral disc degeneration, lumbar region: Secondary | ICD-10-CM | POA: Diagnosis not present

## 2020-10-25 DIAGNOSIS — M9901 Segmental and somatic dysfunction of cervical region: Secondary | ICD-10-CM | POA: Diagnosis not present

## 2020-10-26 ENCOUNTER — Ambulatory Visit: Payer: Medicare Other | Admitting: Internal Medicine

## 2020-10-31 DIAGNOSIS — R5383 Other fatigue: Secondary | ICD-10-CM | POA: Diagnosis not present

## 2020-11-01 ENCOUNTER — Other Ambulatory Visit: Payer: Self-pay

## 2020-11-01 ENCOUNTER — Encounter: Payer: Self-pay | Admitting: Neurology

## 2020-11-01 ENCOUNTER — Telehealth (INDEPENDENT_AMBULATORY_CARE_PROVIDER_SITE_OTHER): Payer: Medicare Other | Admitting: Neurology

## 2020-11-01 VITALS — Ht 71.0 in | Wt 261.0 lb

## 2020-11-01 DIAGNOSIS — G43109 Migraine with aura, not intractable, without status migrainosus: Secondary | ICD-10-CM | POA: Diagnosis not present

## 2020-11-01 MED ORDER — RIZATRIPTAN BENZOATE 10 MG PO TABS: 10.0000 mg | ORAL_TABLET | ORAL | 11 refills | Status: DC | PRN

## 2020-11-01 MED ORDER — AIMOVIG 140 MG/ML ~~LOC~~ SOAJ: 1.0000 | SUBCUTANEOUS | 11 refills | Status: DC

## 2020-11-01 NOTE — Progress Notes (Signed)
Virtual Visit via Video Note The purpose of this virtual visit is to provide medical care while limiting exposure to the novel coronavirus.    Consent was obtained for video visit:  Yes.   Answered questions that patient had about telehealth interaction:  Yes.   I discussed the limitations, risks, security and privacy concerns of performing an evaluation and management service by telemedicine. I also discussed with the patient that there may be a patient responsible charge related to this service. The patient expressed understanding and agreed to proceed.  Pt location: Home Physician Location: office Name of referring provider:  Aline August, MD I connected with Grant [name] at patients initiation/request on 11/01/2020 at 11:30 AM EST by video enabled telemedicine application and verified that I am speaking with the correct person using two identifiers. Pt MRN:  [id number] Pt DOB:  June 04, 1951 Video Participants:  Grant [name];  Grant [name] (Spouse)   History of Present Illness:  The patient had a virtual video visit on 11/01/2020. He was last seen in the neurology clinic 7 months ago for migraines. He had an episode of confusion/repeating himself in April 2020, TGA versus complicated migraine. This has not recurred since April 2020, he denies any confusion since December 2020. He was started on Aimovig for migraines and reports an improvement, he has 2-3 migraines a month and they are not as severe. He takes a couple of Tylenol, and if ineffective would take the Maxalt. He had a rough end of the year with upper respiratory infections, in and out of the ER for 3-4 months, then diverticulitis in December. He had pain which caused a lot of leg spasms/shaking. No loss of awareness. He has had aching in one part of his body for the last few months, it would last for a time then go away. He had been taking MSM for arthritis and the swelling in his hands did go down, however he started  taking a lot and got dizzy with visual symptoms similar to his migraines. He stopped MSM a week ago and these symptoms resolved. Sleep is good with his CPAP machine. No falls. We discussed follow-up MRA done July 2021 which was normal, no evidence of aneurysm. It was noted that the appearance on last study was probably influenced by motion degradation.    History on Initial Assessment 03/02/2019: This is a 70 year old right-handed man with a history of OSA, atrial fibrillation on chronic anticoagulation, aortic valve replacement, depression, chronic fatigue syndrome/fibromyalgia, presenting after hospital admission last 02/16/2019 for altered mental status. He was in his usual state of health ("a little unusual all the time anyway"), working in his shop when he stopped doing one thing and could not remember what he was going to do next. As this was going on, his wife came to the shop and he does not remember much until they were riding in the car to the hospital. His wife reports that he has had baseline confusion with his chronic fatigue syndrome for more than 15 years, worse when he is tired, but this day was unusual because he was looking all over the place not knowing what he was doing, and he kept asking her the same thing repeatedly. He would usually argue when he goes to the doctor, but this time did not argue, he was not as cognizant of how different/serious it was while he was in the hospital. His wife reported that he had been "bouncing off the edge of a migraine  daily for 2-3 weeks prior." He was taking a lot of Tylenol, Aleve, and Maxalt. It appears he was back to baseline in the ER. Bloodwork was normal, UDS and EtOH level negative. I personally reviewed MRI/MRA brain which did not show any acute changes. There was mild chronic microvascular disease. There was scattered small vessel atherosclerotic change without significant stenosis. There was an incidental finding of a 48mm focal outpouching at the  left MCA bifurcation, suspicious for aneurysm. His wake and drowsy EEG was normal. Symptoms felt due to transient global amnesia or migraine. No further recurrence of similar significant confusion.  They report a complicated history since his early 68s which has been diagnosed as chronic fatigue syndrome (CFS). He takes Effexor. His wife states it has been more than 15 years where he has been confused and kind of forgetful. 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 He would have days where he speaks and thinks better, figuring out things faster, usually when he has an adrenaline burst, then would crash for a couple of days. If he gets overstimulated or surprised by a loud sound or too much information, he would "start going somewhere else" and get confused. He got lost in a store one time. It can last 1-2 hours or a whole day before he is back to his usual self. 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 He has had migraines since childhood, usually with stabbing pain in the left temporal or retroorbital regions. If he does not stop them, they may go on all day. He would have tunnel vision and see black shimmering objects, with nausea and light sensitivity, tunnel vision. His left eye would almost shut. He has been taking Maxalt every other day for the past few weeks. He has never been on a preventative medication. He took perphenazine in the past but it caused tongue swelling. He took it a week or so ago and his tongue swelled up again. They report balance issues for the past 1.5 years, he would have extreme fatigue where he feels like he would pass out, occasional vertical diplopia. He was in the hospital while visiting the beach in 08/2018. They fell since his cardiac surgery in 2018, "more funny things have been going on since then." He does not drive. His  mother and maternal grandmother had migraines. He denies any focal numbness/tingling/weakness, dysphagia. Sleep is good with CPAP machine.     Current Outpatient Medications on File Prior to Visit  Medication Sig Dispense Refill  . acetaminophen (TYLENOL) 500 MG tablet Take 1,000 mg by mouth every 8 (eight) hours as needed for mild pain, fever or headache. Per bottle as needed    . albuterol (VENTOLIN HFA) 108 (90 Base) MCG/ACT inhaler INHALE 2 PUFFS INTO THE LUNGS EVERY 6 HOURS AS NEEDED FOR WHEEZING OR SHORTNESS OF BREATH (Patient taking differently: Inhale 2 puffs into the lungs every 6 (six) hours as needed for wheezing or shortness of breath.) 18 g 5  . ALPRAZolam (XANAX) 0.25 MG tablet Take 1 tablet (0.25 mg total) by mouth 3 (three) times daily as needed for anxiety. 15 tablet 0  . azelastine (ASTELIN) 137 MCG/SPRAY nasal spray Place 1-2 sprays into the nose 2 (two) times daily as needed for rhinitis or allergies.     . chlorpheniramine (CHLOR-TRIMETON) 4 MG tablet Take 4 mg by  mouth as needed for allergies.    . COLCRYS 0.6 MG tablet Take 0.6 mg by mouth daily.     Marland Kitchen Dextromethorphan-guaiFENesin (MUCINEX DM MAXIMUM STRENGTH) 60-1200 MG TB12 Take 1 tablet by mouth 2 (two) times daily as needed (congestion/cough).    Marland Kitchen DHEA 50 MG TABS Take 50 mg by mouth daily with breakfast.     . Ferrous Sulfate (IRON) 142 (45 Fe) MG TBCR Take 142 mg by mouth in the morning and at bedtime.    . finasteride (PROSCAR) 5 MG tablet Take 5 mg by mouth at bedtime.     Marland Kitchen levothyroxine (SYNTHROID, LEVOTHROID) 112 MCG tablet Take 112 mcg by mouth daily before breakfast.     . liothyronine (CYTOMEL) 25 MCG tablet Take 25 mcg by mouth daily.    Marland Kitchen losartan (COZAAR) 50 MG tablet TAKE 1 TABLET BY MOUTH EVERY DAY (Patient taking differently: Take 50 mg by mouth daily.) 30 tablet 6  . Menthol, Topical Analgesic, (BLUE-EMU MAXIMUM STRENGTH EX) Apply 1 application topically as needed (pain).    . Methylsulfonylmethane (MSM)  POWD Take 4 g by mouth daily.    . metoprolol tartrate (LOPRESSOR) 50 MG tablet Take 1 tablet (50 mg total) by mouth 2 (two) times daily. 180 tablet 3  . Multiple Vitamin (MULTIVITAMIN WITH MINERALS) TABS tablet Take 1 tablet by mouth daily.    . naproxen sodium (ALEVE) 220 MG tablet Take 220-440 mg by mouth 2 (two) times daily as needed (pain/headache).    Marland Kitchen olopatadine (PATANOL) 0.1 % ophthalmic solution Place 1 drop into both eyes 2 (two) times daily as needed for allergies.    Marland Kitchen omeprazole (PRILOSEC) 40 MG capsule Take 1 capsule (40 mg total) by mouth 2 (two) times daily. 180 capsule 1  . rivaroxaban (XARELTO) 20 MG TABS tablet Take 1 tablet (20 mg total) by mouth daily with supper. 90 tablet 3  . SYMBICORT 160-4.5 MCG/ACT inhaler INHALE 2 PUFFS INTO THE LUNGS TWICE DAILY (Patient taking differently: Inhale 2 puffs into the lungs 2 (two) times daily.) 10.2 g 6  . tamsulosin (FLOMAX) 0.4 MG CAPS capsule Take 0.4 mg by mouth daily after supper.    . testosterone cypionate (DEPOTESTOSTERONE CYPIONATE) 200 MG/ML injection Inject 100 mg into the muscle once a week.    . venlafaxine (EFFEXOR-XR) 150 MG 24 hr capsule Take 150 mg by mouth daily with breakfast.    . amoxicillin (AMOXIL) 500 MG capsule Take 4 capsules (2,000 mg total) by mouth See admin instructions. 1 hour prior to dental procedure (Patient not taking: Reported on 11/01/2020) 8 capsule 5  . benzonatate (TESSALON) 200 MG capsule TAKE 1 CAPSULE BY MOUTH EVERY 8 HOURS AS NEEDED FOR COUGH (Patient not taking: Reported on 11/01/2020) 40 capsule 0  . EPINEPHrine 0.3 mg/0.3 mL IJ SOAJ injection Inject 0.3 mLs (0.3 mg total) into the muscle as needed (for angioedema). (Patient not taking: Reported on 11/01/2020) 2 Device 0  . traMADol (ULTRAM) 50 MG tablet Take 1 tablet (50 mg total) by mouth every 6 (six) hours as needed for moderate pain. 15 tablet 0   No current facility-administered medications on file prior to visit.      Observations/Objective:   Vitals:   11/01/20 0820  Weight: 261 lb (118.4 kg)  Height: 5\' 11"  (1.803 m)   GEN:  The patient appears stated age and is in NAD.  Neurological examination: Patient is awake, alert. No aphasia or dysarthria. Intact fluency and comprehension. Cranial nerves: Extraocular movements intact  with no nystagmus. No facial asymmetry. Motor: moves all extremities symmetrically, at least anti-gravity x 4.    Assessment and Plan:   This is a 70 yo RH man with a history of OSA, atrial fibrillation on chronic anticoagulation, aortic valve replacement, depression, chronic fatigue syndrome/fibromyalgia, who had an episode of confusion last April 2020 and December 2020. MRI brain and EEG unremarkable. Differentials include complicated migraines, TGA, versus seizure, he has had an increase in headaches around the time of both episodes. No further confusional episodes. Migraines have had better response to Aimovig with Maxalt for rescue. Refills sent. We discussed repeat MRA head which did not show any evidence of aneurysm. Continue migraine calendar. Follow-up in 6-8 months, they know to call for any changes.    Follow Up Instructions:   -I discussed the assessment and treatment plan with the patient. The patient was provided an opportunity to ask questions and all were answered. The patient agreed with the plan and demonstrated an understanding of the instructions.   The patient was advised to call back or seek an in-person evaluation if the symptoms worsen or if the condition fails to improve as anticipated.     Cameron Sprang, MD

## 2020-11-20 ENCOUNTER — Other Ambulatory Visit: Payer: Self-pay

## 2020-11-20 ENCOUNTER — Ambulatory Visit: Payer: Medicare Other | Admitting: Internal Medicine

## 2020-11-20 ENCOUNTER — Encounter: Payer: Self-pay | Admitting: Internal Medicine

## 2020-11-20 DIAGNOSIS — J45991 Cough variant asthma: Secondary | ICD-10-CM | POA: Diagnosis not present

## 2020-11-20 DIAGNOSIS — R058 Other specified cough: Secondary | ICD-10-CM

## 2020-11-20 NOTE — Assessment & Plan Note (Signed)
Flare 03/09/2019 > try 1st gen H1 blockers per guidelines   - Sinus CT 09/18/20 Clear paranasal sinuses.  This component also much better

## 2020-11-20 NOTE — Progress Notes (Signed)
Grant [name], male    DOB: 09/23/1951      MRN: [id number]    Brief patient profile:  70 yowm quit smoking 1981 with no cough or sob at that point and then developed prolonged cough in winter while working in ? [name] basement" at [facility] around 2005 and q winter since then same pattern but became more of a chronic year round pattern around 2015  Some  worse esp in winter usually better in summer mostly dry but more productive since summer of 2019 variably purulent.   Patient timeline listed below: 06/10/2018-[facility]-cough and congestion 06/25/2018-flu shot and Prevnar 07/22/2018- returned from beach with bad cough 07/26/2018- echo with [name] 08/11/2018-[facility]-cough with Depo shot 08/20/2018-[facility] 08/24/2018-[facility] cough and Depo shot 09/22/18 -[facility]-Z-Pak and Depo-Medrol injection 12/30/2019[name]- Depo-Medrol and Levaquin      11/15/2018   1st office eval/Trana Ressler re cough x acute on chronic worse since late summer 2019  And turned brown just x few weeks  Chief Complaint  Patient presents with  . Pulmonary Consult    Former patient of [name]. Pt c/o cough since Sept 2019.  Cough is prod with brown sputum. He coughs the most in the morning when he wakes up. He occ wakes up in the night coughing.   assoc with sensation pnds/ best rx is depomedrol 120 mg IM  Advair rx now worse with advair and prev worse with symb 160 but hfa poor baseline zpak now Med list not accurate No increase chronic doe x  MMRC3 = can't walk 100 yards even at a slow pace at a flat grade s stopping due to sob     [name] Reflux v Neurogenic Cough Differentiator Reflux Comments  Do you awaken from a sound sleep coughing violently?                            With trouble breathing? Some nights Unless cough med    Do you have choking episodes when you cannot  Get enough air, gasping for air ?               Yes   Do you usually cough when you lie down into  The bed, or when you just lie down to rest ?                          < 45 degrees start coughing   Do you usually cough after meals or eating?         Big meal   Do you cough when (or after) you bend over?    No    GERD SCORE     [name] Reflux v Neurogenic Cough Differentiator Neurogenic   Do you more-or-less cough all day long? All day   Does change of temperature make you cough? No    Does laughing or chuckling cause you to cough? sometimes   Do fumes (perfume, automobile fumes, burned  Toast, etc.,) cause you to cough ?      no   Does speaking, singing, or talking on the phone cause you to cough   ?               Talking    Neurogenic/Airway score        No obvious other patterns in day to day or  daytime variability or assoc   mucus plugs or hemoptysis or cp or chest tightness, subjective wheeze or overt   hb symptoms ( on ppi bid but not ac)     rec If the mucus doesn't turn clear >>> omnicef 300 mg twice daily x 10 days For cough / congestion >  mucinex dm 1200 mg every 12 hours supplement with tessilon and no delsym  Depomedrol 120 mg IM  Symbicort 80 Take 2 puffs first thing in am and then another 2 puffs about 12 hours later.  Only use your levoalbuterol as a rescue medication  Please schedule a follow up office visit in 2 weeks, call sooner if needed with all medications /inhalers/ solutions in hand    11/29/2018  f/u ov/Elisha Mcgruder re: chronic throat clearing x decades/  cough worse since summer 2019 / confused with meds  Chief Complaint  Patient presents with  . Follow-up    Cough has improved slightly. He uses his xopenex and ventolin every few days.   Dyspnea: denies now but very sedentary  Cough: min improved  Sleeping: no bed blocks  SABA use: using maint alb and xopenex  02: none / cpap does  fine per [name]  rec Delsym two tsp twice daily automatically  Symbicort 160 Take 2 puffs first thing in am and then  another 2 puffs about 12 hours later.  Only use your albuterol and levoalbuterol as a rescue medication Omeprazole 40 mg Take 30- 60 min before your first and last meals of the day  GERD diet  See [name] w/in 2 weeks with all your medications,   - late: add add gabapentin 100 tid if still throat cleairng on delsym 2 tsp bid and tessalon prn  Improved by 12/16/18 and given medication calendar  NP eval and recs  12/20/18 acutely worse cough  Mucinex Twice daily  As needed  Cough  Deslym 2 tsp Twice daily  As needed  Cough Tessalon Three times a day  As needed  Cough .  Try Chlortabs 4mg  Twice daily  As needed  Drainage , throat clearing , drippy nose.  Steroid injection today .   06/05/19 another flare x 10 days > zpak / depomedrol    07/07/2019  f/u ov/Vanya Carberry re: daily cough x 2015 best rx p ov 11/29/18 but note worse again by 12/20/18 and really never stopped throat clearing.  Chief Complaint  Patient presents with  . Acute office visit     Cough has been worse over the past few wks and is occ prod with brown sputum. He is using his albuterol inhaler 1-2 x per day on average.   Dyspnea:  Doe x 50 ft  Cough: w/in a few hours of getting out of bed starts up coughing with min mucus production slt brown and assoc with his "usual" sense of pnds but nothing purulent from nose   Sleeping: bed blocks and cpap tends to sleep fine  SABA use: up to twice daily at most plus symb 160 2bid maint and hfa 75% effective technique  02: none  rec Augmentin 875 mg take one pill twice daily  X 10 days   Depomedrol 120 mg  IM today  GERD diet For cough >  mucinex dm 1200 mg every 12 hours as needed and if still coughing add the tessalon to it. See calendar for specific medication    09/11/2020  f/u ov/Ermelinda Eckert re: cough 2005  symbicort 160  2 q 12 h /  covid vaccine x 3 doses Chief Complaint  Patient presents with  . Follow-up    Cough is about the same since his last visit. Breathing is unchanged. He is  using his albuterol inhaler 5-6 x per wk on average.   Dyspnea:  MMRC2 = can't walk a nl pace on a flat grade s sob but does fine slow and flat  Cough sporadic but daytime min mucoid  Sleeping: on cpap, cough is less and a few hours after taking it off and stirring starts up again  SABA use: as above  02:none  rec For cough >  mucinex dm 1200 mg every 12 hours as needed and if still coughing add the tessalon to it. For drainage  > chlorpheniramine up to every 4 hours as needed  Depomedrol 120 mg IM today  Sinus CT will be scheduled and call the results  Please schedule a follow up office visit in 6 weeks, call sooner if needed with all medications /inhalers/ solutions   Admit date: 10/10/2020 Discharge date: 10/13/2020  Brief/Interim Summary: 69 y.o.malewith medical history significant ofrecurrent diverticulitis, asthma, depression, status post aortic valve replacement in 2018, fibromyalgia, GERD, chronic migraines, obstructive sleep apnea, hypothyroidism and paroxysmal atrial fibrillation who presented to the ER with abdominal pain with nausea and an episode of vomiting. On presentation, he had a temperature of 100.1, tachycardic, respiratory rate of 20 with white count of 16.2 and lactic acid of 2.4. CT of the abdomen and pelvis showed sigmoid diverticulitis. He was admitted for sepsis secondary to sigmoid diverticulitis and started on IV fluids and antibiotics. During the hospitalization, his condition has improved. He is tolerating diet with improving abdominal pain. He is hemodynamically stable with no increased leukocytosis. He will be discharged home on oral antibiotics.  Discharge Diagnoses:  Sepsis: Present on admission Acute sigmoid diverticulitis Leukocytosis -Patient presented with a temperature of 100.1, tachycardic, respiratory rate of 20 with white count of 16.2, lactic acid of 2.4 most likely secondary to sigmoid diverticulitis as evident on CT of the abdomen and  pelvis. -Afebrile over the last 24 hours.Leukocytosis has resolved. CRP improving to 5.6 today. -Currently on Rocephin and Flagyl.  - He is tolerating diet with improving abdominal pain. He is hemodynamically stable -Discharge home today on oral ciprofloxacin and Flagyl for 10 days. -will need outpatient follow-up with GI in 6 to 8 weeks time -Sepsis has resolved  Essential hypertension -Blood pressure stable. Continue home regimen.  Hypothyroidism -Continue levothyroxine and liothyronine  Paroxysmal atrial fibrillation--rate mostly controlled. Continue Xarelto  GERD -Continue Protonix  OSA -Continue CPAP at night  Morbid obesity -Outpatient follow-up  ? Complicated migraine history versus panic attack versus? Seizure -Patient has history of abnormal body movements specially in stressful condition and has followed up with neurology in the past who thinks that patient has complicated migraine. Patient needed to have IV Ativan during the hospitalization and wife thought that he was panicking and improved with Ativan. Will need outpatient neurology follow-up. Will not start any seizure medications for now. Continue as needed triptan -will send home on few days of oral Xanax as needed     11/20/2020  f/u ov/Suleiman Finigan re:  Cough x 2005  Chief Complaint  Patient presents with  . Follow-up    Patient states that he is doing much better since last visit. No concerns at this time.   Dyspnea: was doing 2 miles  Baseline - now a block or two  Cough: not bad / some noct x 2 /  week Sleeping: on bed blocks / cpap most nights sleeping better  SABA use: rarely using  02: none    No obvious day to day or daytime variability or assoc excess/ purulent sputum or mucus plugs or hemoptysis or cp or chest tightness, subjective wheeze or overt sinus or hb symptoms.   Sleeping as above  without nocturnal  or early am exacerbation  of respiratory  c/o's or need for noct saba. Also  denies any obvious fluctuation of symptoms with weather or environmental changes or other aggravating or alleviating factors except as outlined above   No unusual exposure hx or h/o childhood pna/ asthma or knowledge of premature birth.  Current Allergies, Complete Past Medical History, Past Surgical History, Family History, and Social History were reviewed in [employer] record.  ROS  The following are not active complaints unless bolded Hoarseness, sore throat, dysphagia, dental problems, itching, sneezing,  nasal congestion or discharge of excess mucus or purulent secretions, ear ache,   fever, chills, sweats, unintended wt loss or wt gain, classically pleuritic or exertional cp,  orthopnea pnd or arm/hand swelling  or leg swelling, presyncope, palpitations, abdominal pain, anorexia, nausea, vomiting, diarrhea  or change in bowel habits or change in bladder habits, change in stools or change in urine, dysuria, hematuria,  rash, arthralgias, visual complaints, headache, numbness, weakness or ataxia or problems with walking or coordination,  change in mood or  memory.        Current Meds  Medication Sig  . acetaminophen (TYLENOL) 500 MG tablet Take 1,000 mg by mouth every 8 (eight) hours as needed for mild pain, fever or headache. Per bottle as needed  . albuterol (VENTOLIN HFA) 108 (90 Base) MCG/ACT inhaler INHALE 2 PUFFS INTO THE LUNGS EVERY 6 HOURS AS NEEDED FOR WHEEZING OR SHORTNESS OF BREATH (Patient taking differently: Inhale 2 puffs into the lungs every 6 (six) hours as needed for wheezing or shortness of breath.)  . ALPRAZolam (XANAX) 0.25 MG tablet Take 1 tablet (0.25 mg total) by mouth 3 (three) times daily as needed for anxiety.  Marland Kitchen amoxicillin (AMOXIL) 500 MG capsule Take 4 capsules (2,000 mg total) by mouth See admin instructions. 1 hour prior to dental procedure  . azelastine (ASTELIN) 137 MCG/SPRAY nasal spray Place 1-2 sprays into the nose 2 (two) times daily as  needed for rhinitis or allergies.   . benzonatate (TESSALON) 200 MG capsule TAKE 1 CAPSULE BY MOUTH EVERY 8 HOURS AS NEEDED FOR COUGH  . chlorpheniramine (CHLOR-TRIMETON) 4 MG tablet Take 4 mg by mouth as needed for allergies.  . COLCRYS 0.6 MG tablet Take 0.6 mg by mouth daily.   Marland Kitchen Dextromethorphan-guaiFENesin (MUCINEX DM MAXIMUM STRENGTH) 60-1200 MG TB12 Take 1 tablet by mouth 2 (two) times daily as needed (congestion/cough).  Marland Kitchen DHEA 50 MG TABS Take 50 mg by mouth daily with breakfast.   . EPINEPHrine 0.3 mg/0.3 mL IJ SOAJ injection Inject 0.3 mLs (0.3 mg total) into the muscle as needed (for angioedema).  Eduard Roux (AIMOVIG) 140 MG/ML SOAJ Inject 140 mg into the skin every 30 (thirty) days.  . Ferrous Sulfate (IRON) 142 (45 Fe) MG TBCR Take 142 mg by mouth in the morning and at bedtime.  . finasteride (PROSCAR) 5 MG tablet Take 5 mg by mouth at bedtime.   Marland Kitchen levothyroxine (SYNTHROID, LEVOTHROID) 112 MCG tablet Take 112 mcg by mouth daily before breakfast.   . liothyronine (CYTOMEL) 25 MCG tablet Take 25 mcg by mouth daily.  Marland Kitchen  losartan (COZAAR) 50 MG tablet TAKE 1 TABLET BY MOUTH EVERY DAY (Patient taking differently: Take 50 mg by mouth daily.)  . Menthol, Topical Analgesic, (BLUE-EMU MAXIMUM STRENGTH EX) Apply 1 application topically as needed (pain).  . Methylsulfonylmethane (MSM) POWD Take 4 g by mouth daily.  . metoprolol tartrate (LOPRESSOR) 50 MG tablet Take 1 tablet (50 mg total) by mouth 2 (two) times daily.  . Multiple Vitamin (MULTIVITAMIN WITH MINERALS) TABS tablet Take 1 tablet by mouth daily.  . naproxen sodium (ALEVE) 220 MG tablet Take 220-440 mg by mouth 2 (two) times daily as needed (pain/headache).  Marland Kitchen olopatadine (PATANOL) 0.1 % ophthalmic solution Place 1 drop into both eyes 2 (two) times daily as needed for allergies.  Marland Kitchen omeprazole (PRILOSEC) 40 MG capsule Take 1 capsule (40 mg total) by mouth 2 (two) times daily.  . rivaroxaban (XARELTO) 20 MG TABS tablet Take 1  tablet (20 mg total) by mouth daily with supper.  . rizatriptan (MAXALT) 10 MG tablet Take 1 tablet (10 mg total) by mouth as needed for migraine. May repeat in 2 hours if needed  . SYMBICORT 160-4.5 MCG/ACT inhaler INHALE 2 PUFFS INTO THE LUNGS TWICE DAILY (Patient taking differently: Inhale 2 puffs into the lungs 2 (two) times daily.)  . tamsulosin (FLOMAX) 0.4 MG CAPS capsule Take 0.4 mg by mouth daily after supper.  . testosterone cypionate (DEPOTESTOSTERONE CYPIONATE) 200 MG/ML injection Inject 100 mg into the muscle once a week.  . venlafaxine (EFFEXOR-XR) 150 MG 24 hr capsule Take 150 mg by mouth daily with breakfast.                 Objective:       11/20/2020         270 09/11/2020     280  07/07/2019       265   11/29/18 272 lb (123.4 kg)  11/15/18 274 lb (124.3 kg)  11/08/18 267 lb (121.1 kg)    Vital signs reviewed  11/20/2020  - Note at rest 02 sats  98% on RA    General appearance:   Obese amb wm nad    HEENT : pt wearing mask not removed for exam due to covid -19 concerns.    NECK :  without JVD/Nodes/TM/ nl carotid upstrokes bilaterally   LUNGS: no acc muscle use,  Nl contour chest which is clear to A and P bilaterally without cough on insp or exp maneuvers   CV:  RRR  no s3 or murmur or increase in P2, and no edema   ABD:  Obese soft and nontender with nl inspiratory excursion in the supine position. No bruits or organomegaly appreciated, bowel sounds nl  MS:  Nl gait/ ext warm without deformities, calf tenderness, cyanosis or clubbing No obvious joint restrictions   SKIN: warm and dry without lesions    NEURO:  alert, approp, nl sensorium with  no motor or cerebellar deficits apparent.           Assessment

## 2020-11-20 NOTE — Patient Instructions (Signed)
No change medications at this point   See calendar for specific medication instructions and bring it back for each and every office visit for every healthcare provider you see.  Without it,  you may not receive the best quality medical care that we feel you deserve.  You will note that the calendar groups together  your maintenance  medications that are timed at particular times of the day.  Think of this as your checklist for what your doctor has instructed you to do until your next evaluation to see what benefit  there is  to staying on a consistent group of medications intended to keep you well.  The other group at the bottom is entirely up to you to use as you see fit  for specific symptoms that may arise between visits that require you to treat them on an as needed basis.  Think of this as your action plan or "what if" list.   Separating the top medications from the bottom group is fundamental to providing you adequate care going forward.     Please schedule a follow up visit in 3 months but call sooner if needed

## 2020-11-20 NOTE — Assessment & Plan Note (Addendum)
Body mass index is 37.77 kg/m.  -  trending down/ congradulated  Lab Results  Component Value Date   TSH 0.951 02/16/2019     Contributing to gerd risk/ doe/reviewed the need and the process to achieve and maintain neg calorie balance > defer f/u primary care including intermittently monitoring thyroid status              Total time for H and P, chart review, counseling, reviewing hfa/spacer device(s) and generating customized AVS unique to this office visit / same day charting = 28 min

## 2020-11-20 NOTE — Assessment & Plan Note (Signed)
Quit smoking 1981 with Onset of cough around ? 2005 (throat clearing x decades) became daily around 2015 - Spirometry 11/15/2018  FEV1 3.1 (89%)  Ratio 0.74 s curvature ? On advair - FENO 11/15/2018  =   7 - symb 80 2bid 11/15/2018 > ? Helped but pt returned on symb 160 plus maint saba x 2 different versions so rec continue symb 160  - flared early Aug 2020 wit reported brown mucus and nl cxr > pulmonary ov 07/07/2019  rec augmentin x 10days and continue symb 160 2bid / depomedrol 120 mg IM > transient improvement  - 11/20/2020 all better on symbicort 160 2bid with spacer and max gerd rx/ [place]    All goals of chronic asthma control met including optimal function and elimination of symptoms with minimal need for rescue therapy.  Contingencies discussed in full including contacting this office immediately if not controlling the symptoms using the rule of two's.       Each maintenance medication was reviewed in detail including most importantly the difference between maintenance and as needed and under what circumstances the prns are to be used. This was done in the context of a medication calendar review which provided the patient with a user-friendly unambiguous mechanism for medication administration and reconciliation and provides an action plan for all active problems. It is critical that this be shown to every doctor  for modification during the office visit if necessary so the patient can use it as a working document.

## 2020-11-22 ENCOUNTER — Other Ambulatory Visit: Payer: Self-pay | Admitting: Gastroenterology

## 2020-11-22 DIAGNOSIS — K5792 Diverticulitis of intestine, part unspecified, without perforation or abscess without bleeding: Secondary | ICD-10-CM | POA: Diagnosis not present

## 2020-11-22 DIAGNOSIS — Z86010 Personal history of colonic polyps: Secondary | ICD-10-CM | POA: Diagnosis not present

## 2020-11-26 ENCOUNTER — Telehealth: Payer: Self-pay

## 2020-11-26 NOTE — Telephone Encounter (Signed)
Pt wife was available, but patient was not. She will call back with him tomorrow.

## 2020-11-26 NOTE — Telephone Encounter (Signed)
Patient with diagnosis of atrial fibrillation on Xarelto for anticoagulation.    Procedure: colonoscopy Date of procedure: 02/06/21   CHA2DS2-VASc Score = 2  This indicates a 2.2% annual risk of stroke. The patient's score is based upon: CHF History: No HTN History: Yes Diabetes History: No Stroke History: No Vascular Disease History: No Age Score: 1 Gender Score: 0  CrCl 112.2  (75.3 w/ IBW) Platelet count 128  Per office protocol, patient can hold Xarelto for 1 days prior to procedure.   Patient will not need bridging with Lovenox (enoxaparin) around procedure.

## 2020-11-26 NOTE — Telephone Encounter (Signed)
   [facility] Pre-operative Risk Assessment     Request for surgical clearance:  What type of surgery is being performed? Colonoscopy  When is this surgery scheduled? 02/06/2021  What type of clearance is required (medical clearance vs. Pharmacy clearance to hold med vs. Both)? Both  Are there any medications that need to be held prior to surgery and how long? Xarelto  Practice name and name of physician performing surgery? [facility]; Dr. Watt Climes  What is the office phone number? [phone]   7.   What is the office fax number? [phone]  8.   Anesthesia type (None, local, MAC, general) ? Genera

## 2020-11-27 NOTE — Telephone Encounter (Signed)
Patient's wife calling back. She states to call her back before noon.

## 2020-11-29 ENCOUNTER — Other Ambulatory Visit: Payer: Self-pay | Admitting: Internal Medicine

## 2020-11-29 DIAGNOSIS — R059 Cough, unspecified: Secondary | ICD-10-CM

## 2020-11-29 NOTE — Telephone Encounter (Addendum)
   Primary Cardiologist: [name], MD  Chart reviewed as part of pre-operative protocol coverage. Patient was contacted 11/29/2020 in reference to pre-operative risk assessment for pending surgery as outlined below.  Grant [name] was last seen on 09/24/20 by Dr. [name] - h/o PAF, HTN, OSA, pericardial AVR, amongst other issues outlined in note. Presurgery cath 2018 without significant CAD. Spoke with patient who requested I speak with his wife. Per discussion, he has not had any new cardiac symptoms or concerns. Therefore, based on ACC/AHA guidelines, the patient would be at acceptable risk for the planned procedure without further cardiovascular testing.   Pharmacist reviewed chart and recommends: "Per office protocol, patient can hold Xarelto for 1 days prior to procedure.  Patient will not need bridging with Lovenox (enoxaparin) around procedure."  The patient was advised that if he develops new symptoms prior to surgery to contact our office to arrange for a follow-up visit, and he verbalized understanding.  I will route this recommendation to the requesting party via Epic fax function and remove from pre-op pool. Please call with questions.  Charlie Pitter, PA-C 11/29/2020, 10:32 AM

## 2020-12-05 DIAGNOSIS — G4733 Obstructive sleep apnea (adult) (pediatric): Secondary | ICD-10-CM | POA: Diagnosis not present

## 2020-12-11 ENCOUNTER — Telehealth: Payer: Self-pay | Admitting: Neurology

## 2020-12-11 MED ORDER — AIMOVIG 140 MG/ML ~~LOC~~ SOAJ: 1.0000 | SUBCUTANEOUS | 11 refills | Status: DC

## 2020-12-11 NOTE — Telephone Encounter (Signed)
Spoke with pt wife last month when Daryon got him Aimovig injection he only heard the 1st click not the 2nd looked at it and the yellow applicator only went half way. Pt took his medication on 11/13/20. He has had an increase in headaches dizzy nausea increase in BP. PT has been taken his rizatriptan. Pt did take his Aimovig injection today 3 days early at 12:30 hoping to get some relief. Pt wife advised next time that aimovig misfires to please call the office so that we can get the lot number and information so we can send it to the company, and so that pt can get another dose of medication. Pt stated that Aimovig is not lasting 30 days having to take rizatriptan to last the 30 days. Can we redo order for Aimovig every 28 days to see that will last him?

## 2020-12-11 NOTE — Telephone Encounter (Signed)
Pt wife called and informed that frequency was changed to every 28 days and new script was called in to pharmacy

## 2020-12-11 NOTE — Telephone Encounter (Signed)
Received voicemail from [employer] The patient is having some side effects of the Aimovig shot. She wants to know if he should use his other one. She stated the patient was in "some distress".

## 2020-12-11 NOTE — Telephone Encounter (Signed)
Yes, ok to change to every 28 days, thanks!

## 2020-12-14 DIAGNOSIS — M25512 Pain in left shoulder: Secondary | ICD-10-CM | POA: Diagnosis not present

## 2020-12-14 DIAGNOSIS — M25511 Pain in right shoulder: Secondary | ICD-10-CM | POA: Diagnosis not present

## 2020-12-17 DIAGNOSIS — J40 Bronchitis, not specified as acute or chronic: Secondary | ICD-10-CM | POA: Diagnosis not present

## 2020-12-17 DIAGNOSIS — R5383 Other fatigue: Secondary | ICD-10-CM | POA: Diagnosis not present

## 2020-12-17 DIAGNOSIS — I10 Essential (primary) hypertension: Secondary | ICD-10-CM | POA: Diagnosis not present

## 2020-12-17 DIAGNOSIS — E291 Testicular hypofunction: Secondary | ICD-10-CM | POA: Diagnosis not present

## 2020-12-17 DIAGNOSIS — J209 Acute bronchitis, unspecified: Secondary | ICD-10-CM | POA: Diagnosis not present

## 2020-12-17 DIAGNOSIS — E039 Hypothyroidism, unspecified: Secondary | ICD-10-CM | POA: Diagnosis not present

## 2020-12-20 ENCOUNTER — Telehealth: Payer: Self-pay | Admitting: Neurology

## 2020-12-20 NOTE — Telephone Encounter (Signed)
Patients wife called in stating the patient has been having a migraine with the light sensitivity, nausea, difficult speech, and almost fainted yesterday. They need some advice on what to do so he can get some relief.

## 2020-12-21 ENCOUNTER — Ambulatory Visit (INDEPENDENT_AMBULATORY_CARE_PROVIDER_SITE_OTHER): Payer: Medicare Other

## 2020-12-21 ENCOUNTER — Other Ambulatory Visit: Payer: Self-pay

## 2020-12-21 DIAGNOSIS — R519 Headache, unspecified: Secondary | ICD-10-CM

## 2020-12-21 MED ORDER — KETOROLAC TROMETHAMINE 60 MG/2ML IM SOLN
60.0000 mg | Freq: Once | INTRAMUSCULAR | Status: AC
  Administered 2020-12-21: 60 mg via INTRAMUSCULAR

## 2020-12-21 MED ORDER — DIPHENHYDRAMINE HCL 50 MG/ML IJ SOLN
25.0000 mg | Freq: Once | INTRAMUSCULAR | Status: AC
  Administered 2020-12-21: 25 mg via INTRAVENOUS

## 2020-12-21 MED ORDER — METOCLOPRAMIDE HCL 5 MG/ML IJ SOLN
10.0000 mg | Freq: Once | INTRAMUSCULAR | Status: AC
  Administered 2020-12-21: 10 mg via INTRAMUSCULAR

## 2020-12-21 NOTE — Telephone Encounter (Signed)
Options are 1-week prednisone taper or coming in for migraine cocktail, thanks

## 2020-12-21 NOTE — Progress Notes (Signed)
Pt was given Headache cocktail tolerated it well, pt had other symptoms and had an appointment scheduled for Monday to see Dr Delice Lesch . Pt wife with pt as driver,

## 2020-12-21 NOTE — Telephone Encounter (Signed)
Pt is coming in for a migraine cocktail

## 2020-12-24 ENCOUNTER — Other Ambulatory Visit: Payer: Self-pay | Admitting: Cardiovascular Disease

## 2020-12-24 ENCOUNTER — Encounter: Payer: Self-pay | Admitting: Neurology

## 2020-12-24 ENCOUNTER — Other Ambulatory Visit: Payer: Self-pay

## 2020-12-24 ENCOUNTER — Ambulatory Visit (INDEPENDENT_AMBULATORY_CARE_PROVIDER_SITE_OTHER): Payer: Medicare Other | Admitting: Neurology

## 2020-12-24 VITALS — BP 137/82 | HR 67 | Ht 70.0 in | Wt 248.0 lb

## 2020-12-24 DIAGNOSIS — R259 Unspecified abnormal involuntary movements: Secondary | ICD-10-CM

## 2020-12-24 DIAGNOSIS — G43109 Migraine with aura, not intractable, without status migrainosus: Secondary | ICD-10-CM | POA: Diagnosis not present

## 2020-12-24 NOTE — Patient Instructions (Signed)
1. Schedule MRI brain without contrast 2. Schedule EEG 3. Bloodwork for CMP, ammonia, [facility] paraneoplastic panel 4. Follow-up after tests, call for any changes

## 2020-12-24 NOTE — Progress Notes (Signed)
NEUROLOGY FOLLOW UP OFFICE NOTE  Grant [name] [id number] 21-Apr-1951  HISTORY OF PRESENT ILLNESS: I had the pleasure of seeing Grant [name] in follow-up in the neurology clinic on 12/24/2020.  The patient was last seen 2 months ago for migraines but presents for an urgent visit due to a change in symptoms. He is again accompanied by his wife who helps supplement the history today.  She provides a 1-page account of changes since his last visit. He is currently being treated for bronchitis and completed a Z-pack. He has been having weakness, severe fatigue, dizziness, balance issues. He has been having jerking/twitching/exaggerated gestures, mostly on the right side. It seems like this side is slightly diminished. She has been biting his tongue, his tongue sticks out and his speech is thick. He has been having agitation and takes prn Xanax. He has had a 37-lb weight loss since 12/22 (281 lbs to 265 lbs on 12/25). He has sleep problems, she gives him Ambien on occasion. His migraines have been sever for about 2 weeks, more frquent that prior. She has noticed lack of dexterity, he is dropping things and cannot make his hands work,muscle tone is diminished. His eyes are puffy. She notices he flushes bright red up his arms, face, and neck, sweating, afebrile. She also notes more temper/mood issues. He has been under major stress from ongoing renovation disaster in there home. She wrote "nerves, stomps foot." He says he feels like something has "broken" and he is in trouble.  Symptoms became worse in the past 2 weeks, last week was really bad where he was just jerking and waving his right arm, making little noises. He keeps moving his right leg, "more frisky than normal." When he uses his computer mouse, it starts moving and he has to make it stop, he can't just put the cursor on. His dexterity is gone on the right, he has hobbies but now knocks things over. When he is tapping on his cellphone, his hand would  jerk forward, like he cannot control it. His wife also notes he is thrashing and kicking in his sleep, mostly on the right side as well. He would stick his tongue out in a different way and has bit his tongue. His wife notes that is he has a beer, he seems more agitated. He endorses significant stress with their home renovation situation, they are firing them today. With the weight loss, he states he is still eating but losing weight. No difficulty swallowing. He feels sick to his stomach but would eat. There have been 2 instances he felt he would pass out. He could hardly tell a difference with the headaches after the migraine cocktail, his wife feels he did better. He denies any headaches today but feels he is starting to get one. He wears sunglasses indoors due to light sensitivity.    History on Initial Assessment 03/02/2019: This is a 70 year old right-handed man with a history of OSA, atrial fibrillation on chronic anticoagulation, aortic valve replacement, depression, chronic fatigue syndrome/fibromyalgia, presenting after hospital admission last 02/16/2019 for altered mental status. He was in his usual state of health ("a little unusual all the time anyway"), working in his shop when he stopped doing one thing and could not remember what he was going to do next. As this was going on, his wife came to the shop and he does not remember much until they were riding in the car to the hospital. His wife reports that he has  had baseline confusion with his chronic fatigue syndrome for more than 15 years, worse when he is tired, but this day was unusual because he was looking all over the place not knowing what he was doing, and he kept asking her the same thing repeatedly. He would usually argue when he goes to the doctor, but this time did not argue, he was not as cognizant of how different/serious it was while he was in the hospital. His wife reported that he had been "bouncing off the edge of a migraine daily  for 2-3 weeks prior." He was taking a lot of Tylenol, Aleve, and Maxalt. It appears he was back to baseline in the ER. Bloodwork was normal, UDS and EtOH level negative. I personally reviewed MRI/MRA brain which did not show any acute changes. There was mild chronic microvascular disease. There was scattered small vessel atherosclerotic change without significant stenosis. There was an incidental finding of a 8mm focal outpouching at the left MCA bifurcation, suspicious for aneurysm. His wake and drowsy EEG was normal. Symptoms felt due to transient global amnesia or migraine. No further recurrence of similar significant confusion.  They report a complicated history since his early 52s which has been diagnosed as chronic fatigue syndrome (CFS). He takes Effexor. His wife states it has been more than 15 years where he has been confused and kind of forgetful. 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 He would have days where he speaks and thinks better, figuring out things faster, usually when he has an adrenaline burst, then would crash for a couple of days. If he gets overstimulated or surprised by a loud sound or too much information, he would "start going somewhere else" and get confused. He got lost in a store one time. It can last 1-2 hours or a whole day before he is back to his usual self. 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 He has had migraines since childhood, usually with stabbing pain in the left temporal or retroorbital regions. If he does not stop them, they may go on all day. He would have tunnel vision and see black shimmering objects, with nausea and light sensitivity, tunnel vision. His left eye would almost shut. He has been taking Maxalt every other day for the past few weeks. He has never been on a preventative medication. He took  perphenazine in the past but it caused tongue swelling. He took it a week or so ago and his tongue swelled up again. They report balance issues for the past 1.5 years, he would have extreme fatigue where he feels like he would pass out, occasional vertical diplopia. He was in the hospital while visiting the beach in 08/2018. They fell since his cardiac surgery in 2018, "more funny things have been going on since then." He does not drive. His mother and maternal grandmother had migraines. He denies any focal numbness/tingling/weakness, dysphagia. Sleep is good with CPAP machine.    PAST MEDICAL HISTORY: Past Medical History:  Diagnosis Date  . Allergic rhinitis   . Anxiety   . Arthritis   . Asthma   . Bladder tumor   . Chronic fatigue   . Depression    06/30/17 Pt denies being depressed, reports Effexor is taken for Chronic Fatigue   . Dyspnea   . Enlarged prostate   . Fibromyalgia   .  GERD (gastroesophageal reflux disease)   . Headache   . History of chronic bronchitis   . History of migraine   . History of toxic encephalopathy   . Hypothyroidism   . OSA on CPAP    CPAP 14  . PAF (paroxysmal atrial fibrillation) ([facility]) CARDIOLOGIST -- DR [name]   DX OCT 2013  . S/P AVR (aortic valve replacement) and aortoplasty   . 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 Unspecified essential hypertension   . Urethral tumor    PROSTATIC    MEDICATIONS: Current Outpatient Medications on File Prior to Visit  Medication Sig Dispense Refill  . acetaminophen (TYLENOL) 500 MG tablet Take 1,000 mg by mouth every 8 (eight) hours as needed for mild pain, fever or headache. Per bottle as needed    . albuterol (VENTOLIN HFA) 108 (90 Base) MCG/ACT inhaler INHALE 2 PUFFS INTO THE LUNGS EVERY 6 HOURS AS NEEDED FOR WHEEZING OR SHORTNESS OF BREATH (Patient taking differently: Inhale 2 puffs into the lungs every 6 (six) hours as needed for wheezing or shortness of breath.) 18 g 5  . ALPRAZolam (XANAX) 0.25 MG tablet Take 1  tablet (0.25 mg total) by mouth 3 (three) times daily as needed for anxiety. 15 tablet 0  . azelastine (ASTELIN) 137 MCG/SPRAY nasal spray Place 1-2 sprays into the nose 2 (two) times daily as needed for rhinitis or allergies.     . benzonatate (TESSALON) 200 MG capsule TAKE 1 CAPSULE BY MOUTH EVERY 8 HOURS AS NEEDED FOR COUGH 40 capsule 0  . chlorpheniramine (CHLOR-TRIMETON) 4 MG tablet Take 4 mg by mouth as needed for allergies.    . COLCRYS 0.6 MG tablet Take 0.6 mg by mouth daily.     Marland Kitchen Dextromethorphan-guaiFENesin (MUCINEX DM MAXIMUM STRENGTH) 60-1200 MG TB12 Take 1 tablet by mouth 2 (two) times daily as needed (congestion/cough).    Marland Kitchen DHEA 50 MG TABS Take 50 mg by mouth daily with breakfast.     . EPINEPHrine 0.3 mg/0.3 mL IJ SOAJ injection Inject 0.3 mLs (0.3 mg total) into the muscle as needed (for angioedema). 2 Device 0  . Erenumab-aooe (AIMOVIG) 140 MG/ML SOAJ Inject 140 mg into the skin every 28 (twenty-eight) days. 1 mL 11  . Ferrous Sulfate (IRON) 142 (45 Fe) MG TBCR Take 142 mg by mouth in the morning and at bedtime.    . finasteride (PROSCAR) 5 MG tablet Take 5 mg by mouth at bedtime.     Marland Kitchen levothyroxine (SYNTHROID, LEVOTHROID) 112 MCG tablet Take 112 mcg by mouth daily before breakfast.     . liothyronine (CYTOMEL) 25 MCG tablet Take 25 mcg by mouth daily.    Marland Kitchen losartan (COZAAR) 50 MG tablet TAKE 1 TABLET BY MOUTH EVERY DAY (Patient taking differently: Take 50 mg by mouth daily.) 30 tablet 6  . metoprolol tartrate (LOPRESSOR) 50 MG tablet Take 1 tablet (50 mg total) by mouth 2 (two) times daily. 180 tablet 3  . olopatadine (PATANOL) 0.1 % ophthalmic solution Place 1 drop into both eyes 2 (two) times daily as needed for allergies.    Marland Kitchen omeprazole (PRILOSEC) 40 MG capsule Take 1 capsule (40 mg total) by mouth 2 (two) times daily. 180 capsule 1  . rivaroxaban (XARELTO) 20 MG TABS tablet Take 1 tablet (20 mg total) by mouth daily with supper. 90 tablet 3  . rizatriptan (MAXALT) 10 MG  tablet Take 1 tablet (10 mg total) by mouth as needed for migraine. May repeat in 2 hours if needed  10 tablet 11  . SYMBICORT 160-4.5 MCG/ACT inhaler INHALE 2 PUFFS INTO THE LUNGS TWICE DAILY (Patient taking differently: Inhale 2 puffs into the lungs 2 (two) times daily.) 10.2 g 6  . tamsulosin (FLOMAX) 0.4 MG CAPS capsule Take 0.4 mg by mouth daily after supper.    . testosterone cypionate (DEPOTESTOSTERONE CYPIONATE) 200 MG/ML injection Inject 100 mg into the muscle once a week.    . venlafaxine (EFFEXOR-XR) 150 MG 24 hr capsule Take 150 mg by mouth daily with breakfast.    . amoxicillin (AMOXIL) 500 MG capsule Take 4 capsules (2,000 mg total) by mouth See admin instructions. 1 hour prior to dental procedure (Patient not taking: Reported on 12/24/2020) 8 capsule 5  . Menthol, Topical Analgesic, (BLUE-EMU MAXIMUM STRENGTH EX) Apply 1 application topically as needed (pain).    . Methylsulfonylmethane (MSM) POWD Take 4 g by mouth daily.    . Multiple Vitamin (MULTIVITAMIN WITH MINERALS) TABS tablet Take 1 tablet by mouth daily.    . naproxen sodium (ALEVE) 220 MG tablet Take 220-440 mg by mouth 2 (two) times daily as needed (pain/headache).     No current facility-administered medications on file prior to visit.    ALLERGIES: Allergies  Allergen Reactions  . Perphenazine Other (See Comments)    Tongue swelling  . Amiodarone Swelling  . Codeine Itching and Other (See Comments)  . Tramadol Other (See Comments)    dellusion     FAMILY HISTORY: Family History  Problem Relation Age of Onset  . Aortic aneurysm Mother 70       cause of death  . Other Father        motor vehicle accident  . Heart disease Other        family history    SOCIAL HISTORY: Social History   Socioeconomic History  . Marital status: Married    Spouse name: Not on file  . Number of children: Not on file  . Years of education: Not on file  . Highest education level: Not on file  Occupational History  .  Occupation: disabled    Comment: owner of buisness  Tobacco Use  . Smoking status: Former Smoker    Packs/day: 0.50    Years: 27.00    Pack years: 13.50    Types: Cigarettes    Quit date: 10/21/1979    Years since quitting: 41.2  . Smokeless tobacco: Never Used  Vaping Use  . Vaping Use: Never used  Substance and Sexual Activity  . Alcohol use: Yes    Alcohol/week: 4.0 standard drinks    Types: 4 Cans of beer per week    Comment: drinks "as much as I could" - 2-3 beers up to 8-10  . Drug use: Never  . Sexual activity: Not on file  Other Topics Concern  . Not on file  Social History Narrative   Right handed   One story home   Drinks no caffeine   Social Determinants of Health   Financial Resource Strain: Not on file  Food Insecurity: Not on file  Transportation Needs: Not on file  Physical Activity: Not on file  Stress: Not on file  Social Connections: Not on file  Intimate Partner Violence: Not on file     PHYSICAL EXAM: Vitals:   12/24/20 1341  BP: 137/82  Pulse: 67  SpO2: 96%   General: No acute distress Head:  Normocephalic/atraumatic Skin/Extremities: No rash, no edema. There is constant fidgety movement of the right leg throughout the  visit. Neurological Exam: alert and awake. No aphasia or dysarthria. Fund of knowledge is appropriate.  Attention and concentration are normal.   Cranial nerves: Pupils equal, round. Extraocular movements intact with no nystagmus. Visual fields full.  No facial asymmetry.  Motor: Bulk and tone normal, muscle strength 5/5 throughout with no pronator drift. Decreased fine finger movements on the right. Finger to nose testing intact.  Gait slightly wide-based, no ataxia.    IMPRESSION: This is a 70 yo RH man with a history of OSA, atrial fibrillation on chronic anticoagulation, aortic valve replacement, depression, chronic fatigue syndrome/fibromyalgia, who had an episode of confusion last April 2020 and December 2020. MRI brain and  EEG unremarkable. Differentials include complicated migraines, TGA, versus seizure, he has had an increase in headaches around the time of both episodes. He presents for an earlier visit due to new symptoms of right-sided involuntary movements and decreased dexterity. Exam shows no significant weakness except for decreased fine finger movements on right hand, he is noted to have near constant right leg involuntary movement. Concern for underlying structural abnormality such as stroke, MRI brain without contrast will be ordered. He will also be scheduled for an EEG and bloodwork for CMP, ammonia, [facility] autoimmune panel. Follow-up after tests, they know to go to the ER for any sudden worsening of symptoms.    Thank you for allowing me to participate in his care.  Please do not hesitate to call for any questions or concerns.   Ellouise Newer, M.D.   CC: Dr. Kenton Kingfisher

## 2020-12-25 ENCOUNTER — Other Ambulatory Visit (INDEPENDENT_AMBULATORY_CARE_PROVIDER_SITE_OTHER): Payer: Medicare Other

## 2020-12-25 ENCOUNTER — Ambulatory Visit (HOSPITAL_COMMUNITY)
Admission: RE | Admit: 2020-12-25 | Discharge: 2020-12-25 | Disposition: A | Discharge status: Home / Self Care | Payer: Medicare Other | Source: Ambulatory Visit | Attending: Neurology | Admitting: Neurology

## 2020-12-25 ENCOUNTER — Telehealth: Payer: Self-pay

## 2020-12-25 DIAGNOSIS — R29818 Other symptoms and signs involving the nervous system: Secondary | ICD-10-CM | POA: Diagnosis not present

## 2020-12-25 DIAGNOSIS — H748X3 Other specified disorders of middle ear and mastoid, bilateral: Secondary | ICD-10-CM | POA: Diagnosis not present

## 2020-12-25 DIAGNOSIS — R259 Unspecified abnormal involuntary movements: Secondary | ICD-10-CM | POA: Insufficient documentation

## 2020-12-25 DIAGNOSIS — J323 Chronic sphenoidal sinusitis: Secondary | ICD-10-CM | POA: Diagnosis not present

## 2020-12-25 DIAGNOSIS — I616 Nontraumatic intracerebral hemorrhage, multiple localized: Secondary | ICD-10-CM | POA: Diagnosis not present

## 2020-12-25 LAB — COMPREHENSIVE METABOLIC PANEL
ALT: 34 U/L (ref 0–53)
AST: 30 U/L (ref 0–37)
Albumin: 4 g/dL (ref 3.5–5.2)
Alkaline Phosphatase: 56 U/L (ref 39–117)
BUN: 22 mg/dL (ref 6–23)
CO2: 34 mEq/L — ABNORMAL HIGH (ref 19–32)
Calcium: 9.5 mg/dL (ref 8.4–10.5)
Chloride: 99 mEq/L (ref 96–112)
Creatinine, Ser: 1.26 mg/dL (ref 0.40–1.50)
GFR: 58.97 mL/min — ABNORMAL LOW (ref >60.00)
Glucose, Bld: 86 mg/dL (ref 70–99)
Potassium: 4.9 mEq/L (ref 3.5–5.1)
Sodium: 136 mEq/L (ref 135–145)
Total Bilirubin: 0.9 mg/dL (ref 0.2–1.2)
Total Protein: 6.5 g/dL (ref 6.0–8.3)

## 2020-12-25 LAB — AMMONIA: Ammonia: 27 umol/L (ref 11–35)

## 2020-12-25 NOTE — Telephone Encounter (Signed)
Pls let patient's wife know that the brain MRI did not show any evidence of stroke, tumor, or bleed. It was normal. It is good that he did not have a stroke, let's do the EEG and the bloodwork and see if there is anything else we can find to explain his symptoms. Hopefully less stress at home will also help him get better sooner. Thanks

## 2020-12-25 NOTE — Telephone Encounter (Signed)
[facility] [employer] with stat MRI results:  IMPRESSION: No acute abnormality. Negative for acute infarct. Mild white matter changes and mild hyperintensity in the right paracentral pons likely due to chronic ischemia. Stable chronic microhemorrhage left frontal lobe.  These results will be called to the ordering clinician or representative by the Radiologist Assistant, and communication documented in the PACS or [employer].

## 2020-12-25 NOTE — Telephone Encounter (Signed)
Spoke with pt wife informed her that Grant [name]'s  brain MRI did not show any evidence of stroke, tumor, or bleed. It was normal. It is good that he did not have a stroke, let's do the EEG and the bloodwork and see if there is anything else we can find to explain his symptoms. Hopefully less stress at home will also help him get better sooner. Pt had lab work today, EEG is Monday,

## 2020-12-26 ENCOUNTER — Telehealth: Payer: Self-pay

## 2020-12-26 NOTE — Telephone Encounter (Signed)
Spoke with pt wife informed her that Dr Delice Lesch stated pt can take magnesium 400mg  daily. Pt wife stated she did nto know that yesterday when she refilled Grant [name] medication that his Aimovig was in the bag and it was left in the car, drug store nor insurance will not replace it, sample is in the fridge for the pt to pick up on monday

## 2020-12-26 NOTE — Telephone Encounter (Signed)
Pt wife called bloodwork looked fine. She is asking if he should take Magnesium to help with his headaches? She said that someone had told them about that.

## 2020-12-26 NOTE — Telephone Encounter (Signed)
Ok to take magnesium 400mg  daily. thanks

## 2020-12-26 NOTE — Telephone Encounter (Signed)
-----   Message from Cameron Sprang, MD sent at 12/26/2020 12:26 PM EST ----- Pls let wife know bloodwork looked fine, thanks

## 2020-12-31 ENCOUNTER — Ambulatory Visit (INDEPENDENT_AMBULATORY_CARE_PROVIDER_SITE_OTHER): Payer: Medicare Other | Admitting: Neurology

## 2020-12-31 ENCOUNTER — Other Ambulatory Visit: Payer: Self-pay

## 2020-12-31 DIAGNOSIS — R259 Unspecified abnormal involuntary movements: Secondary | ICD-10-CM | POA: Diagnosis not present

## 2021-01-04 ENCOUNTER — Telehealth: Payer: Self-pay | Admitting: Neurology

## 2021-01-04 DIAGNOSIS — J9801 Acute bronchospasm: Secondary | ICD-10-CM | POA: Diagnosis not present

## 2021-01-04 DIAGNOSIS — J209 Acute bronchitis, unspecified: Secondary | ICD-10-CM | POA: Diagnosis not present

## 2021-01-04 DIAGNOSIS — J42 Unspecified chronic bronchitis: Secondary | ICD-10-CM | POA: Diagnosis not present

## 2021-01-04 NOTE — Procedures (Signed)
ELECTROENCEPHALOGRAM REPORT  Date of Study: 12/31/2020  Patient's Name: Grant [name] MRN: [id number] Date of Birth: [birth year]  Referring Provider: Dr. Ellouise Newer  Clinical History: This is a 70 year old man with right-sided involuntary movements. EEG for classification.  Medications: TYLENOL 500 MG tablet VENTOLIN HFA 108 (90 Base) MCG/ACT inhaler XANAX 0.25 MG tablet ASTELIN 137 MCG/SPRAY nasal spray TESSALON 200 MG capsule CHLOR-TRIMETON 4 MG tablet COLCRYS 0.6 MG tablet MUCINEX DM MAXIMUM STRENGTH 60-1200 MG TB12 DHEA 50 MG TABS EPINEPHrine 0.3 mg/0.3 mL IJ SOAJ injection AIMOVIG 140 MG/ML SOAJ IRON 142 (45 Fe) MG TBCR PROSCAR 5 MG tablet SYNTHROID, LEVOTHROID 112 MCG tablet CYTOMEL 25 MCG tablet COZAAR 50 MG tablet LOPRESSOR 50 MG tablet PATANOL 0.1 % ophthalmic solution PRILOSEC 40 MG capsule XARELTO 20 MG TABS tablet MAXALT 10 MG tablet SYMBICORT 160-4.5 MCG/ACT inhaler FLOMAX 0.4 MG CAPS capsule DEPOTESTOSTERONE CYPIONATE 200 MG/ML  injection EFFEXOR-XR 150 MG 24 hr capsule BLUE-EMU MAXIMUM STRENGTH EX MSM POWD MULTIVITAMIN WITH MINERALS TABS tablet ALEVE 220 MG tablet   Technical Summary: A multichannel digital EEG recording measured by the international 10-20 system with electrodes applied with paste and impedances below 5000 ohms performed in our laboratory with EKG monitoring in an awake and asleep patient.  Hyperventilation was not performed. Photic stimulation was performed.  The digital EEG was referentially recorded, reformatted, and digitally filtered in a variety of bipolar and referential montages for optimal display.    Description: The patient is awake and asleep during the recording.  During maximal wakefulness, there is a symmetric, medium voltage 8-9 Hz posterior dominant rhythm that attenuates with eye opening.  The record is symmetric.  During drowsiness and stage I sleep, there is an increase in theta slowing of the background with  occasional Vertex waves seen. Photic stimulation did not elicit any abnormalities.  There were no epileptiform discharges or electrographic seizures seen.    EKG lead was unremarkable.  Impression: This awake and asleep EEG is normal.    Clinical Correlation: A normal EEG does not exclude a clinical diagnosis of epilepsy.  If further clinical questions remain, prolonged EEG may be helpful.  Clinical correlation is advised.   Ellouise Newer, M.D.

## 2021-01-04 NOTE — Telephone Encounter (Signed)
Do you have these results ready?

## 2021-01-04 NOTE — Telephone Encounter (Signed)
Patient's wife states that they haven't heard anything regarding his EEG results. Please call.

## 2021-01-04 NOTE — Telephone Encounter (Signed)
Still has some involuntary movements, some migraines, wife states, "about the same, when you seen him". Seeing his PCP for a cough, on antibotics x 9 days. Fyi

## 2021-01-04 NOTE — Telephone Encounter (Signed)
Pls let wife know the brain wave test was normal. How is he feeling? Thanks

## 2021-01-21 ENCOUNTER — Telehealth: Payer: Self-pay | Admitting: Internal Medicine

## 2021-01-21 NOTE — Telephone Encounter (Signed)
Pts wife stated that he has been having a constant cough since Friday. Pt stated that he is not able to get any type of relief unless on CPAP. Pt is not coughing up any phlegm. Pts wife states that it has been ongoing and believes it could be pneumonia and wanted to be seen w/ Dr. Melvyn Novas did inform pts wife he had no availability but he could be seen by NP tomorrow but she stated she would rather talk to a nurse first before doing that. Pls regard [phone].

## 2021-01-21 NOTE — Telephone Encounter (Signed)
Called and spoke with Juliann Pulse, Patient's Wife (DPR). Juliann Pulse requested OV with Dr. Melvyn Novas for Patient.  Advised Dr. Melvyn Novas is in [place] this week, and his schedule is full.  Juliann Pulse requested she bring Patient by office for a depo shot. Advised Patient needs to be seen in office. Juliann Pulse stated Patient has been having increased non productive cough since last week. Juliann Pulse stated Patient saw PCP last week, given a depo shot, and advised to see Dr Melvyn Novas if no improvement. Juliann Pulse stated Patient has been doing his Dr.Wert instructions for cough, sugarless candy, Mucinex, and Delsym. Juliann Pulse stated the only thing that helps Patient in his coughing fits is to use his cpap. Juliann Pulse stated Patient has not had any coughing fits this morning. Patient scheduled with [name], NP at 2pm 01/22/21.  Advised if Patient seemed to get worse to seek Urgent or ED care.  LOV with Dr. Melvyn Novas 11/20/20  Instructions  No change medications at this point   See calendar for specific medication instructions and bring it back for each and every office visit for every healthcare provider you see.  Without it,  you may not receive the best quality medical care that we feel you deserve.  You will note that the calendar groups together  your maintenance  medications that are timed at particular times of the day.  Think of this as your checklist for what your doctor has instructed you to do until your next evaluation to see what benefit  there is  to staying on a consistent group of medications intended to keep you well.  The other group at the bottom is entirely up to you to use as you see fit  for specific symptoms that may arise between visits that require you to treat them on an as needed basis.  Think of this as your action plan or "what if" list.   Separating the top medications from the bottom group is fundamental to providing you adequate care going forward.     Please schedule a follow up visit in 3 months but call sooner if needed

## 2021-01-22 ENCOUNTER — Encounter: Payer: Self-pay | Admitting: Adult Health

## 2021-01-22 ENCOUNTER — Ambulatory Visit (INDEPENDENT_AMBULATORY_CARE_PROVIDER_SITE_OTHER): Payer: Medicare Other

## 2021-01-22 ENCOUNTER — Other Ambulatory Visit: Payer: Self-pay

## 2021-01-22 ENCOUNTER — Ambulatory Visit: Payer: Medicare Other | Admitting: Adult Health

## 2021-01-22 VITALS — BP 120/82 | HR 61 | Temp 97.2°F | Ht 70.0 in | Wt 241.0 lb

## 2021-01-22 DIAGNOSIS — G4733 Obstructive sleep apnea (adult) (pediatric): Secondary | ICD-10-CM | POA: Diagnosis not present

## 2021-01-22 DIAGNOSIS — J45991 Cough variant asthma: Secondary | ICD-10-CM | POA: Diagnosis not present

## 2021-01-22 DIAGNOSIS — Z9989 Dependence on other enabling machines and devices: Secondary | ICD-10-CM

## 2021-01-22 DIAGNOSIS — R059 Cough, unspecified: Secondary | ICD-10-CM

## 2021-01-22 DIAGNOSIS — J45909 Unspecified asthma, uncomplicated: Secondary | ICD-10-CM

## 2021-01-22 MED ORDER — METHYLPREDNISOLONE ACETATE 80 MG/ML IJ SUSP
80.0000 mg | Freq: Once | INTRAMUSCULAR | Status: AC
  Administered 2021-01-22: 80 mg via INTRAMUSCULAR

## 2021-01-22 MED ORDER — BENZONATATE 200 MG PO CAPS: 200.0000 mg | ORAL_CAPSULE | Freq: Three times a day (TID) | ORAL | 1 refills | Status: DC | PRN

## 2021-01-22 NOTE — Patient Instructions (Addendum)
Chest xray today  Depo Medrol injection .  Mucinex Twice daily  As needed  Cough  Deslym 2 tsp Twice daily  As needed  Cough Tessalon Three times a day  As needed  Cough .  Follow up with Dr. Melvyn Novas next month as planned  and As needed   Please contact office for sooner follow up if symptoms do not improve or worsen or seek emergency care

## 2021-01-22 NOTE — Assessment & Plan Note (Signed)
Work on healthy weight loss 

## 2021-01-22 NOTE — Progress Notes (Signed)
@Patient  ID: Grant [name], male    DOB: 09-13-1951, 70 y.o.   MRN: [id number]  Chief Complaint  Patient presents with  . Follow-up    Referring provider: Shirline Frees, MD  HPI: 70 year old male former smoker followed for asthma and chronic bronchitis, allergic rhinitis, obstructive sleep apnea, chronic cough Medical history significant for A. fib on chronic anticoagulation, depression, angioedema, aortic valve replacement, toxic encephalopathy, chronic fatigue and fibromyalgia  TEST/EVENTS :  PFT 06/01/17-WNL-FVC 4.46/96%, FEV1 3.71/106%, ratio 0.83, FEF 25-75%4.64/167%, no response to BD , TLC 107%, DLCO 101%  NPSG 03/24/03-AHI 88/ hr, desaturation to 75%, body weight 244 lbs  01/22/2021 Acute OV : Asthma  Patient returns for an acute office visit. Around 4 weeks ago started with cough, congestion , sinus congestion . Initially seen by PCP given Z-pack without much improvement . Went back started on Levaquin which he finished 2 weeks ago. Was better for few days but still has ongoing cough . Cough is minimally productive . Has severe coughing fits .   Denies any fever, chest pain, orthopnea, edema.  Appetite is good no nausea vomiting or diarrhea.  Patient does have underlying sleep apnea.  Is on nocturnal CPAP.  Says he is doing well on CPAP.  He never misses a night.  Patient says he feels rested with no significant daytime sleepiness.  CPAP download shows excellent compliance with 100% usage.  Daily average usage at 10 hours.  Patient has AHI at 2.6.  Minimal leaks.  Patient is on CPAP 16 cm H2O.   Allergies  Allergen Reactions  . Perphenazine Other (See Comments)    Tongue swelling  . Amiodarone Swelling  . Codeine Itching and Other (See Comments)  . Tramadol Other (See Comments)    dellusion     Immunization History  Administered Date(s) Administered  . Fluad Quad(high Dose 65+) 07/07/2019  . Influenza, High Dose Seasonal PF 06/25/2018  . Influenza,inj,Quad  PF,6+ Mos 10/31/2016, 07/12/2017  . Influenza-Unspecified 07/12/2020  . PFIZER(Purple Top)SARS-COV-2 Vaccination 11/26/2019, 12/21/2019, 07/12/2020  . Pneumococcal Conjugate-13 06/25/2018  . Pneumococcal Polysaccharide-23 07/12/2017  . Zoster 10/01/2020    Past Medical History:  Diagnosis Date  . Allergic rhinitis   . Anxiety   . Arthritis   . Asthma   . Bladder tumor   . Chronic fatigue   . Depression    06/30/17 Pt denies being depressed, reports Effexor is taken for Chronic Fatigue   . Dyspnea   . Enlarged prostate   . Fibromyalgia   . GERD (gastroesophageal reflux disease)   . Headache   . History of chronic bronchitis   . History of migraine   . History of toxic encephalopathy   . Hypothyroidism   . OSA on CPAP    CPAP 14  . PAF (paroxysmal atrial fibrillation) ([place]) CARDIOLOGIST -- DR [name]   DX OCT 2013  . S/P AVR (aortic valve replacement) and aortoplasty   . 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 Unspecified essential hypertension   . Urethral tumor    PROSTATIC    Tobacco History: Social History   Tobacco Use  Smoking Status Former Smoker  . Packs/day: 0.50  . Years: 27.00  . Pack years: 13.50  . Types: Cigarettes  . Quit date: 10/21/1979  . Years since quitting: 41.2  Smokeless Tobacco Never Used   Counseling given: Not Answered   Outpatient Medications Prior to Visit  Medication Sig Dispense Refill  . acetaminophen (TYLENOL) 500 MG tablet Take 1,000 mg by  mouth every 8 (eight) hours as needed for mild pain, fever or headache. Per bottle as needed    . albuterol (VENTOLIN HFA) 108 (90 Base) MCG/ACT inhaler INHALE 2 PUFFS INTO THE LUNGS EVERY 6 HOURS AS NEEDED FOR WHEEZING OR SHORTNESS OF BREATH (Patient taking differently: Inhale 2 puffs into the lungs every 6 (six) hours as needed for wheezing or shortness of breath.) 18 g 5  . ALPRAZolam (XANAX) 0.25 MG tablet Take 1 tablet (0.25 mg total) by mouth 3 (three) times daily as needed for anxiety. 15 tablet 0  .  amoxicillin (AMOXIL) 500 MG capsule Take 4 capsules (2,000 mg total) by mouth See admin instructions. 1 hour prior to dental procedure 8 capsule 5  . azelastine (ASTELIN) 137 MCG/SPRAY nasal spray Place 1-2 sprays into the nose 2 (two) times daily as needed for rhinitis or allergies.     . benzonatate (TESSALON) 200 MG capsule TAKE 1 CAPSULE BY MOUTH EVERY 8 HOURS AS NEEDED FOR COUGH 40 capsule 0  . chlorpheniramine (CHLOR-TRIMETON) 4 MG tablet Take 4 mg by mouth as needed for allergies.    . COLCRYS 0.6 MG tablet Take 0.6 mg by mouth daily.     Marland Kitchen Dextromethorphan-guaiFENesin (MUCINEX DM MAXIMUM STRENGTH) 60-1200 MG TB12 Take 1 tablet by mouth 2 (two) times daily as needed (congestion/cough).    Marland Kitchen DHEA 50 MG TABS Take 50 mg by mouth daily with breakfast.     . EPINEPHrine 0.3 mg/0.3 mL IJ SOAJ injection Inject 0.3 mLs (0.3 mg total) into the muscle as needed (for angioedema). 2 Device 0  . Erenumab-aooe (AIMOVIG) 140 MG/ML SOAJ Inject 140 mg into the skin every 28 (twenty-eight) days. 1 mL 11  . Ferrous Sulfate (IRON) 142 (45 Fe) MG TBCR Take 142 mg by mouth in the morning and at bedtime.    . finasteride (PROSCAR) 5 MG tablet Take 5 mg by mouth at bedtime.     Marland Kitchen levothyroxine (SYNTHROID, LEVOTHROID) 112 MCG tablet Take 112 mcg by mouth daily before breakfast.     . liothyronine (CYTOMEL) 25 MCG tablet Take 25 mcg by mouth daily.    Marland Kitchen losartan (COZAAR) 50 MG tablet TAKE 1 TABLET BY MOUTH EVERY DAY (Patient taking differently: Take 50 mg by mouth daily.) 30 tablet 6  . Menthol, Topical Analgesic, (BLUE-EMU MAXIMUM STRENGTH EX) Apply 1 application topically as needed (pain).    . metoprolol tartrate (LOPRESSOR) 50 MG tablet TAKE 1 TABLET(50 MG) BY MOUTH TWICE DAILY 180 tablet 3  . Multiple Vitamin (MULTIVITAMIN WITH MINERALS) TABS tablet Take 1 tablet by mouth daily.    Marland Kitchen olopatadine (PATANOL) 0.1 % ophthalmic solution Place 1 drop into both eyes 2 (two) times daily as needed for allergies.    Marland Kitchen  omeprazole (PRILOSEC) 40 MG capsule Take 1 capsule (40 mg total) by mouth 2 (two) times daily. 180 capsule 1  . rivaroxaban (XARELTO) 20 MG TABS tablet Take 1 tablet (20 mg total) by mouth daily with supper. 90 tablet 3  . rizatriptan (MAXALT) 10 MG tablet Take 1 tablet (10 mg total) by mouth as needed for migraine. May repeat in 2 hours if needed 10 tablet 11  . SYMBICORT 160-4.5 MCG/ACT inhaler INHALE 2 PUFFS INTO THE LUNGS TWICE DAILY (Patient taking differently: Inhale 2 puffs into the lungs 2 (two) times daily.) 10.2 g 6  . tamsulosin (FLOMAX) 0.4 MG CAPS capsule Take 0.4 mg by mouth daily after supper.    . testosterone cypionate (DEPOTESTOSTERONE CYPIONATE) 200 MG/ML  injection Inject 100 mg into the muscle once a week.    . venlafaxine (EFFEXOR-XR) 150 MG 24 hr capsule Take 150 mg by mouth daily with breakfast.    . Methylsulfonylmethane (MSM) POWD Take 4 g by mouth daily.    . naproxen sodium (ALEVE) 220 MG tablet Take 220-440 mg by mouth 2 (two) times daily as needed (pain/headache).     No facility-administered medications prior to visit.     Review of Systems:   Constitutional:   No  weight loss, night sweats,  Fevers, chills, fatigue, or  lassitude.  HEENT:   No headaches,  Difficulty swallowing,  Tooth/dental problems, or  Sore throat,                No sneezing, itching, ear ache,  +nasal congestion, post nasal drip,   CV:  No chest pain,  Orthopnea, PND, swelling in lower extremities, anasarca, dizziness, palpitations, syncope.   GI  No heartburn, indigestion, abdominal pain, nausea, vomiting, diarrhea, change in bowel habits, loss of appetite, bloody stools.   Resp:   No chest wall deformity  Skin: no rash or lesions.  GU: no dysuria, change in color of urine, no urgency or frequency.  No flank pain, no hematuria   MS:  No joint pain or swelling.  No decreased range of motion.  No back pain.    Physical Exam  BP 120/82 (BP Location: Left Arm, Cuff Size: Normal)    Pulse 61   Temp (!) 97.2 F (36.2 C)   Ht 5\' 10"  (1.778 m)   Wt 241 lb (109.3 kg)   SpO2 98%   BMI 34.58 kg/m   GEN: A/Ox3; pleasant , NAD, BMI 34   HEENT:  [place]/AT,    NOSE-clear, THROAT-clear, no lesions, no postnasal drip or exudate noted.   NECK:  Supple w/ fair ROM; no JVD; normal carotid impulses w/o bruits; no thyromegaly or nodules palpated; no lymphadenopathy.    RESP  Clear  P & A; w/o, wheezes/ rales/ or rhonchi. no accessory muscle use, no dullness to percussion  CARD:  RRR, no m/r/g, tr  peripheral edema, pulses intact, no cyanosis or clubbing.  GI:   Soft & nt; nml bowel sounds; no organomegaly or masses detected.   Musco: Warm bil, no deformities or joint swelling noted.   Neuro: alert, no focal deficits noted.    Skin: Warm, no lesions or rashes    Lab Results:  CBC  BMET  BNP  ProBNP No results found for: PROBNP  Imaging:   PFT Results Latest Ref Rng & Units 06/01/2017  FVC-Pre L 4.31  FVC-Predicted Pre % 92  FVC-Post L 4.46  FVC-Predicted Post % 96  Pre FEV1/FVC % % 81  Post FEV1/FCV % % 83  FEV1-Pre L 3.49  FEV1-Predicted Pre % 100  FEV1-Post L 3.71  DLCO uncorrected ml/min/mmHg 32.71  DLCO [facility]C% % 101  DLVA Predicted % 98  TLC L 7.52  TLC % Predicted % 107  RV % Predicted % 124    Lab Results  Component Value Date   NITRICOXIDE 28 11/15/2018        Assessment & Plan:   Acute asthmatic bronchitis Slow to resolve asthmatic bronchitic exacerbation.  Hold on additional antibiotics at this time.  Check chest x-ray.  Patient is unable to tolerate oral steroids.  Will give a Depo-Medrol 80 mg IM injection in the office. Continue on aggressive cough suppression regimen.  And trigger prevention  Plan  Patient  Instructions  Chest xray today  Depo Medrol injection .  Mucinex Twice daily  As needed  Cough  Deslym 2 tsp Twice daily  As needed  Cough Tessalon Three times a day  As needed  Cough .  Follow up with Dr. Melvyn Novas next  month as planned  and As needed   Please contact office for sooner follow up if symptoms do not improve or worsen or seek emergency care       OSA on CPAP Excellent control and compliance on nocturnal CPAP no changes  Morbid obesity due to excess calories ([place]) complicated by HBP, OSA/ gerd  Work on healthy weight loss     Rexene Edison, NP 01/22/2021

## 2021-01-22 NOTE — Assessment & Plan Note (Signed)
Excellent control and compliance on nocturnal CPAP no changes

## 2021-01-22 NOTE — Assessment & Plan Note (Signed)
Slow to resolve asthmatic bronchitic exacerbation.  Hold on additional antibiotics at this time.  Check chest x-ray.  Patient is unable to tolerate oral steroids.  Will give a Depo-Medrol 80 mg IM injection in the office. Continue on aggressive cough suppression regimen.  And trigger prevention  Plan  Patient Instructions  Chest xray today  Depo Medrol injection .  Mucinex Twice daily  As needed  Cough  Deslym 2 tsp Twice daily  As needed  Cough Tessalon Three times a day  As needed  Cough .  Follow up with Dr. Melvyn Novas next month as planned  and As needed   Please contact office for sooner follow up if symptoms do not improve or worsen or seek emergency care

## 2021-01-22 NOTE — Addendum Note (Signed)
Addended by: Birder Robson on: 01/22/2021 02:54 PM   Modules accepted: Orders

## 2021-01-23 NOTE — Progress Notes (Signed)
ATC patient x1, left vm for patient to return call regarding cxr results.

## 2021-01-25 NOTE — Progress Notes (Signed)
Called and spoke with Aura Dials (wife), listed on the DPR, advised of the results/recommendations per Rexene Edison NP.  She verbalized understanding.  Nothing further needed.

## 2021-01-28 DIAGNOSIS — J45991 Cough variant asthma: Secondary | ICD-10-CM | POA: Diagnosis not present

## 2021-01-28 DIAGNOSIS — I10 Essential (primary) hypertension: Secondary | ICD-10-CM | POA: Diagnosis not present

## 2021-01-31 NOTE — Progress Notes (Signed)
Attempted to obtain medical history via telephone, unable to reach at this time. Voicemail full unable to leave message at this time.

## 2021-02-04 ENCOUNTER — Other Ambulatory Visit (HOSPITAL_COMMUNITY)
Admission: RE | Admit: 2021-02-04 | Discharge: 2021-02-04 | Disposition: A | Discharge status: Home / Self Care | Payer: Medicare Other | Source: Ambulatory Visit | Attending: Gastroenterology | Admitting: Gastroenterology

## 2021-02-04 DIAGNOSIS — Z01812 Encounter for preprocedural laboratory examination: Secondary | ICD-10-CM | POA: Diagnosis not present

## 2021-02-04 DIAGNOSIS — Z20822 Contact with and (suspected) exposure to covid-19: Secondary | ICD-10-CM | POA: Diagnosis not present

## 2021-02-04 LAB — SARS CORONAVIRUS 2 (TAT 6-24 HRS): SARS Coronavirus 2: NEGATIVE

## 2021-02-06 ENCOUNTER — Ambulatory Visit (HOSPITAL_COMMUNITY): Payer: Medicare Other | Admitting: Certified Registered Nurse Anesthetist

## 2021-02-06 ENCOUNTER — Other Ambulatory Visit: Payer: Self-pay

## 2021-02-06 ENCOUNTER — Encounter (HOSPITAL_COMMUNITY)
Admission: RE | Disposition: A | Discharge status: Home / Self Care | Payer: Self-pay | Source: Home / Self Care | Attending: Gastroenterology

## 2021-02-06 ENCOUNTER — Ambulatory Visit (HOSPITAL_COMMUNITY)
Admission: RE | Admit: 2021-02-06 | Discharge: 2021-02-06 | Disposition: A | Discharge status: Home / Self Care | Payer: Medicare Other | Attending: Gastroenterology | Admitting: Gastroenterology

## 2021-02-06 ENCOUNTER — Encounter (HOSPITAL_COMMUNITY): Payer: Self-pay | Admitting: Gastroenterology

## 2021-02-06 DIAGNOSIS — Z79890 Hormone replacement therapy (postmenopausal): Secondary | ICD-10-CM | POA: Insufficient documentation

## 2021-02-06 DIAGNOSIS — Z79899 Other long term (current) drug therapy: Secondary | ICD-10-CM | POA: Insufficient documentation

## 2021-02-06 DIAGNOSIS — I4891 Unspecified atrial fibrillation: Secondary | ICD-10-CM | POA: Insufficient documentation

## 2021-02-06 DIAGNOSIS — Z888 Allergy status to other drugs, medicaments and biological substances status: Secondary | ICD-10-CM | POA: Diagnosis not present

## 2021-02-06 DIAGNOSIS — K633 Ulcer of intestine: Secondary | ICD-10-CM | POA: Insufficient documentation

## 2021-02-06 DIAGNOSIS — Z87891 Personal history of nicotine dependence: Secondary | ICD-10-CM | POA: Diagnosis not present

## 2021-02-06 DIAGNOSIS — E7800 Pure hypercholesterolemia, unspecified: Secondary | ICD-10-CM | POA: Insufficient documentation

## 2021-02-06 DIAGNOSIS — K5730 Diverticulosis of large intestine without perforation or abscess without bleeding: Secondary | ICD-10-CM | POA: Insufficient documentation

## 2021-02-06 DIAGNOSIS — Z885 Allergy status to narcotic agent status: Secondary | ICD-10-CM | POA: Diagnosis not present

## 2021-02-06 DIAGNOSIS — Z1211 Encounter for screening for malignant neoplasm of colon: Secondary | ICD-10-CM | POA: Diagnosis not present

## 2021-02-06 DIAGNOSIS — D123 Benign neoplasm of transverse colon: Secondary | ICD-10-CM | POA: Insufficient documentation

## 2021-02-06 DIAGNOSIS — I10 Essential (primary) hypertension: Secondary | ICD-10-CM | POA: Diagnosis not present

## 2021-02-06 DIAGNOSIS — Z8249 Family history of ischemic heart disease and other diseases of the circulatory system: Secondary | ICD-10-CM | POA: Diagnosis not present

## 2021-02-06 DIAGNOSIS — Z7901 Long term (current) use of anticoagulants: Secondary | ICD-10-CM | POA: Diagnosis not present

## 2021-02-06 DIAGNOSIS — Z9049 Acquired absence of other specified parts of digestive tract: Secondary | ICD-10-CM | POA: Diagnosis not present

## 2021-02-06 DIAGNOSIS — D509 Iron deficiency anemia, unspecified: Secondary | ICD-10-CM | POA: Insufficient documentation

## 2021-02-06 DIAGNOSIS — Z7951 Long term (current) use of inhaled steroids: Secondary | ICD-10-CM | POA: Diagnosis not present

## 2021-02-06 DIAGNOSIS — Z8719 Personal history of other diseases of the digestive system: Secondary | ICD-10-CM | POA: Insufficient documentation

## 2021-02-06 DIAGNOSIS — Z86010 Personal history of colonic polyps: Secondary | ICD-10-CM | POA: Diagnosis not present

## 2021-02-06 HISTORY — PX: BIOPSY: SHX5522

## 2021-02-06 HISTORY — PX: COLONOSCOPY WITH PROPOFOL: SHX5780

## 2021-02-06 SURGERY — COLONOSCOPY WITH PROPOFOL
Anesthesia: Monitor Anesthesia Care

## 2021-02-06 MED ORDER — LIDOCAINE 2% (20 MG/ML) 5 ML SYRINGE
INTRAMUSCULAR | Status: DC | PRN
  Administered 2021-02-06: 60 mg via INTRAVENOUS

## 2021-02-06 MED ORDER — PROPOFOL 1000 MG/100ML IV EMUL
INTRAVENOUS | Status: AC
  Filled 2021-02-06: qty 100

## 2021-02-06 MED ORDER — LACTATED RINGERS IV SOLN: Freq: Once | INTRAVENOUS | Status: AC

## 2021-02-06 MED ORDER — PROPOFOL 10 MG/ML IV BOLUS
INTRAVENOUS | Status: DC | PRN
  Administered 2021-02-06: 30 mg via INTRAVENOUS

## 2021-02-06 MED ORDER — PROPOFOL 500 MG/50ML IV EMUL
INTRAVENOUS | Status: DC | PRN
  Administered 2021-02-06: 150 ug/kg/min via INTRAVENOUS

## 2021-02-06 MED ORDER — SODIUM CHLORIDE 0.9 % IV SOLN: INTRAVENOUS | Status: DC

## 2021-02-06 MED ORDER — LACTATED RINGERS IV SOLN: INTRAVENOUS | Status: DC | PRN

## 2021-02-06 SURGICAL SUPPLY — 22 items

## 2021-02-06 NOTE — Anesthesia Postprocedure Evaluation (Signed)
Anesthesia Post Note  Patient: Grant [name]  Procedure(s) Performed: COLONOSCOPY WITH PROPOFOL (N/A ) BIOPSY     Patient location during evaluation: PACU Anesthesia Type: MAC Level of consciousness: awake and alert and oriented Pain management: pain level controlled Vital Signs Assessment: post-procedure vital signs reviewed and stable Respiratory status: spontaneous breathing, nonlabored ventilation and respiratory function stable Cardiovascular status: stable and blood pressure returned to baseline Postop Assessment: no apparent nausea or vomiting Anesthetic complications: no   No complications documented.  Last Vitals:  Vitals:   02/06/21 0930 02/06/21 0940  BP: 137/89 116/64  Pulse: 65 61  Resp: 20 (!) 21  Temp:    SpO2: 96% 98%    Last Pain:  Vitals:   02/06/21 0940  TempSrc:   PainSc: 0-No pain                 Jagar Lua A.

## 2021-02-06 NOTE — Discharge Instructions (Signed)
Soft solids today if doing well tomorrow may resume your blood thinner call me as needed and follow-up as needed call for biopsy report in 1 week and I am recommending surgical consult for diverticuli removal  YOU HAD AN ENDOSCOPIC PROCEDURE TODAY: Refer to the procedure report and other information in the discharge instructions given to you for any specific questions about what was found during the examination. If this information does not answer your questions, please call [facility] office at [phone] to clarify.   YOU SHOULD EXPECT: Some feelings of bloating in the abdomen. Passage of more gas than usual. Walking can help get rid of the air that was put into your GI tract during the procedure and reduce the bloating. If you had a lower endoscopy (such as a colonoscopy or flexible sigmoidoscopy) you may notice spotting of blood in your stool or on the toilet paper. Some abdominal soreness may be present for a day or two, also.  DIET: Your first meal following the procedure should be a light meal and then it is ok to progress to your normal diet. A half-sandwich or bowl of soup is an example of a good first meal. Heavy or fried foods are harder to digest and may make you feel nauseous or bloated. Drink plenty of fluids but you should avoid alcoholic beverages for 24 hours. If you had a esophageal dilation, please see attached instructions for diet.    ACTIVITY: Your care partner should take you home directly after the procedure. You should plan to take it easy, moving slowly for the rest of the day. You can resume normal activity the day after the procedure however YOU SHOULD NOT DRIVE, use power tools, machinery or perform tasks that involve climbing or major physical exertion for 24 hours (because of the sedation medicines used during the test).   SYMPTOMS TO REPORT IMMEDIATELY: A gastroenterologist can be reached at any hour. Please call [phone]  for any of the following symptoms:   . Following lower endoscopy (colonoscopy, flexible sigmoidoscopy) Excessive amounts of blood in the stool  Significant tenderness, worsening of abdominal pains  Swelling of the abdomen that is new, acute  Fever of 100 or higher  . Following upper endoscopy (EGD, EUS, ERCP, esophageal dilation) Vomiting of blood or coffee ground material  New, significant abdominal pain  New, significant chest pain or pain under the shoulder blades  Painful or persistently difficult swallowing  New shortness of breath  Black, tarry-looking or red, bloody stools  FOLLOW UP:  If any biopsies were taken you will be contacted by phone or by letter within the next 1-3 weeks. Call [phone]  if you have not heard about the biopsies in 3 weeks.  Please also call with any specific questions about appointments or follow up tests.

## 2021-02-06 NOTE — Transfer of Care (Signed)
Immediate Anesthesia Transfer of Care Note  Patient: Grant [name]  Procedure(s) Performed: COLONOSCOPY WITH PROPOFOL (N/A ) BIOPSY  Patient Location: Endoscopy Unit  Anesthesia Type:MAC  Level of Consciousness: awake, alert , oriented and patient cooperative  Airway & Oxygen Therapy: Patient Spontanous Breathing and Patient connected to face mask  Post-op Assessment: Report given to RN and Post -op Vital signs reviewed and stable  Post vital signs: Reviewed and stable  Last Vitals:  Vitals Value Taken Time  BP 134/84 02/06/21 0920  Temp    Pulse 62 02/06/21 0920  Resp 18 02/06/21 0920  SpO2 100 % 02/06/21 0920  Vitals shown include unvalidated device data.  Last Pain:  Vitals:   02/06/21 0746  TempSrc: Oral         Complications: No complications documented.

## 2021-02-06 NOTE — Progress Notes (Signed)
Grant [name] 8:24 AM  Subjective: Patient doing well since he was recently seen in the office without any new medical issues and we rediscussed the procedure no new complaints  Objective: Vital signs stable afebrile exam please see preassessment evaluation  Assessment: Chronic left lower quadrant pain history of colon polyps due for colon screening  Plan: Okay to proceed with colonoscopy with anesthesia assistance  [facility]  office [phone] After 5PM or if no answer call [phone]

## 2021-02-06 NOTE — Op Note (Signed)
[facility] Patient Name: Grant [name] Procedure Date: 02/06/2021 MRN: [id number] Attending MD: Clarene Essex , MD Date of Birth: 1951/05/25 CSN: [id number] Age: 70 Admit Type: Inpatient Procedure:                Colonoscopy Indications:              High risk colon cancer surveillance: Personal                            history of colonic polyps, Last colonoscopy 5 years                            ago, Incidental - Abdominal pain in the left lower                            quadrant, Incidental - For therapy of diverticulitis Providers:                Clarene Essex, MD, Burtis Junes, RN, Fransico Setters Mbumina,                            Technician Referring MD:              Medicines:                General Anesthesia Complications:            No immediate complications. Estimated Blood Loss:     Estimated blood loss: none. Procedure:                Pre-Anesthesia Assessment:                           - Prior to the procedure, a History and Physical                            was performed, and patient medications and                            allergies were reviewed. The patient's tolerance of                            previous anesthesia was also reviewed. The risks                            and benefits of the procedure and the sedation                            options and risks were discussed with the patient.                            All questions were answered, and informed consent                            was obtained. Prior Anticoagulants: The patient has  taken Xarelto (rivaroxaban), last dose was 2 days                            prior to procedure. ASA Grade Assessment: III - A                            patient with severe systemic disease. After                            reviewing the risks and benefits, the patient was                            deemed in satisfactory condition to undergo the                             procedure.                           After obtaining informed consent, the colonoscope                            was passed under direct vision. Throughout the                            procedure, the patient's blood pressure, pulse, and                            oxygen saturations were monitored continuously. The                            PCF-H190DL ([id number]) Olympus pediatric colonscope                            was introduced through the anus and advanced to the                            the terminal ileum. The terminal ileum, ileocecal                            valve, appendiceal orifice, and rectum were                            photographed. The colonoscopy was performed without                            difficulty. The patient tolerated the procedure                            well. The quality of the bowel preparation was fair. Scope In: 8:47:26 AM Scope Out: 9:10:01 AM Total Procedure Duration: 0 hours 22 minutes 35 seconds  Findings:      Multiple small and large-mouthed diverticula were found in the mid       sigmoid colon and mid descending colon there was a moderate amount of  edema and mild inflammation and luminal narrowing.      A small polyp was found in the hepatic flexure. The polyp was       semi-sessile. The polyp was removed with a cold snare. Resection and       retrieval were complete.      The terminal ileum contained a single (solitary) ulcer. No bleeding was       present.      The exam was otherwise without abnormality although the cecal pole was       not well seen due to formed stool that could not be suctioned. Impression:               - Preparation of the colon was fair.                           - Diverticulosis in the mid sigmoid colon and in                            the mid descending colon with moderate scarring and                            inflammation.                           - One small polyp at the hepatic flexure, removed                             with a cold snare. Resected and retrieved.                           - A single (solitary) ulcer in the terminal ileum.                           - The examination was otherwise normal. Moderate Sedation:      Not Applicable - Patient had care per Anesthesia. Recommendation:           - Patient has a contact number available for                            emergencies. The signs and symptoms of potential                            delayed complications were discussed with the                            patient. Return to normal activities tomorrow.                            Written discharge instructions were provided to the                            patient.                           - Soft diet today.                           -  Resume Xarelto (rivaroxaban) at prior dose                            tomorrow.                           - Await pathology results.                           - Repeat colonoscopy in 5 years for screening                            purposes.                           - Return to GI office PRN.                           - Telephone GI clinic for pathology results in 1                            week.                           - Telephone GI clinic if symptomatic PRN.                           - Refer to a colo-rectal surgeon at appointment to                            be scheduled. Procedure Code(s):        --- Professional ---                           [phone], Colonoscopy, flexible; with removal of                            tumor(s), polyp(s), or other lesion(s) by snare                            technique Diagnosis Code(s):        --- Professional ---                           Z86.010, Personal history of colonic polyps                           K63.5, Polyp of colon                           K63.3, Ulcer of intestine                           K57.30, Diverticulosis of large intestine without                            perforation  or abscess without bleeding CPT copyright 2019 [employer]. All  rights reserved. The codes documented in this report are preliminary and upon coder review may  be revised to meet current compliance requirements. Clarene Essex, MD 02/06/2021 9:29:01 AM This report has been signed electronically. Number of Addenda: 0

## 2021-02-06 NOTE — Anesthesia Preprocedure Evaluation (Addendum)
Anesthesia Evaluation  Patient identified by MRN, date of birth, ID band Patient awake    Reviewed: Allergy & Precautions, NPO status , Patient's Chart, lab work & pertinent test results, reviewed documented beta blocker date and time   Airway Mallampati: III  TM Distance: >3 FB Neck ROM: Full    Dental  (+) Teeth Intact, Dental Advisory Given, Missing, Caps   Pulmonary shortness of breath, with exertion and at rest, asthma , sleep apnea and Continuous Positive Airway Pressure Ventilation , former smoker,    Pulmonary exam normal breath sounds clear to auscultation       Cardiovascular hypertension, Pt. on medications and Pt. on home beta blockers + dysrhythmias Atrial Fibrillation  Rhythm:Regular Rate:Bradycardia  Hx/o AVR and Bentall procedure 2018  EKG 10/11/20 Sinus tachycardia, LAFB, LVH  Echo 07/26/18 Left ventricle: The cavity size was normal. Wall thickness was increased in a pattern of moderate LVH. Systolic function was normal. The estimated ejection fraction was in the range of 60% to 65%. Wall motion was normal; there were no regional wall motion abnormalities. Doppler parameters are consistent with abnormal left ventricular relaxation (grade 1 diastolic dysfunction).  - Aortic valve: A bioprosthesis was present.  - Right atrium: The atrium was mildly dilated.  - Pulmonary arteries: Systolic pressure was mildly increased. PA peak pressure: 34 mm Hg (S).   Cardiac Cath 05/2017 No CAD, Ao root aneurysmal dilation   Neuro/Psych  Headaches, PSYCHIATRIC DISORDERS Anxiety Depression Hx/o toxic encephalopathy  Neuromuscular disease    GI/Hepatic Neg liver ROS, GERD  Medicated and Controlled,Hx/o Colon polyps Diverticulosis   Endo/Other  Hypothyroidism Morbid obesityHypotestosterone  Renal/GU negative Renal ROS   BPH Hx/o Bladder tumor    Musculoskeletal  (+) Arthritis , Osteoarthritis,  Fibromyalgia -   Abdominal (+) + obese,   Peds  Hematology Xarelto therapy- last dose 02/03/19 Thrombocytopenia   Anesthesia Other Findings   Reproductive/Obstetrics                           Anesthesia Physical Anesthesia Plan  ASA: III  Anesthesia Plan: MAC   Post-op Pain Management:    Induction: Intravenous  PONV Risk Score and Plan: 1 and Treatment may vary due to age or medical condition and Propofol infusion  Airway Management Planned: Natural Airway and Simple Face Mask  Additional Equipment:   Intra-op Plan:   Post-operative Plan:   Informed Consent: I have reviewed the patients History and Physical, chart, labs and discussed the procedure including the risks, benefits and alternatives for the proposed anesthesia with the patient or authorized representative who has indicated his/her understanding and acceptance.     Dental advisory given  Plan Discussed with: Anesthesiologist and CRNA  Anesthesia Plan Comments:         Anesthesia Quick Evaluation

## 2021-02-07 ENCOUNTER — Encounter (HOSPITAL_COMMUNITY): Payer: Self-pay | Admitting: Gastroenterology

## 2021-02-07 LAB — SURGICAL PATHOLOGY

## 2021-02-18 ENCOUNTER — Ambulatory Visit: Payer: Medicare Other | Admitting: Internal Medicine

## 2021-02-22 ENCOUNTER — Other Ambulatory Visit: Payer: Self-pay | Admitting: Internal Medicine

## 2021-02-26 ENCOUNTER — Ambulatory Visit: Payer: Medicare Other | Admitting: Internal Medicine

## 2021-02-26 ENCOUNTER — Other Ambulatory Visit: Payer: Self-pay

## 2021-02-26 ENCOUNTER — Encounter: Payer: Self-pay | Admitting: Internal Medicine

## 2021-02-26 DIAGNOSIS — J45991 Cough variant asthma: Secondary | ICD-10-CM

## 2021-02-26 MED ORDER — METHYLPREDNISOLONE ACETATE 80 MG/ML IJ SUSP
120.0000 mg | Freq: Once | INTRAMUSCULAR | Status: AC
  Administered 2021-02-26: 120 mg via INTRAMUSCULAR

## 2021-02-26 MED ORDER — BUDESONIDE-FORMOTEROL FUMARATE 80-4.5 MCG/ACT IN AERO: INHALATION_SPRAY | RESPIRATORY_TRACT | 12 refills | Status: DC

## 2021-02-26 NOTE — Assessment & Plan Note (Signed)
Body mass index is 33.86 kg/m.  -  trending down / encouraged Lab Results  Component Value Date   TSH 0.951 02/16/2019     Contributing to gerd risk/ doe/reviewed the need and the process to achieve and maintain neg calorie balance > defer f/u primary care including intermittently monitoring thyroid status           Each maintenance medication was reviewed in detail including emphasizing most importantly the difference between maintenance and prns and under what circumstances the prns are to be triggered using an action plan format where appropriate.  Total time for H and P, chart review, counseling, reviewing hfa device(s) and generating customized AVS unique to this office visit / same day charting = 36 min

## 2021-02-26 NOTE — Assessment & Plan Note (Signed)
Quit smoking 1981 with Onset of cough around ? 2005 (throat clearing x decades) became daily around 2015 - Spirometry 11/15/2018  FEV1 3.1 (89%)  Ratio 0.74 s curvature ? On advair - FENO 11/15/2018  =   7 - symb 80 2bid 11/15/2018 > ? Helped but pt returned on symb 160 plus maint saba x 2 different versions so rec continue symb 160  - flared early Aug 2020 wit reported brown mucus and nl cxr > pulmonary ov 07/07/2019  rec augmentin x 10days and continue symb 160 2bid / depomedrol 120 mg IM > transient improvement  - 11/20/2020 all better on symbicort 160 2bid with spacer and max gerd rx/ [facility] [place] [facility]  - 02/26/2021  After extensive coaching inhaler device,  effectiveness =    90% s spacer > rec reduce symbicort to 80 2bid since the high dose may be aggravating the UACS  Upper airway cough syndrome (previously labeled PNDS),  is so named because it's frequently impossible to sort out how much is  CR/sinusitis with freq throat clearing (which can be related to primary GERD)   vs  causing  secondary (" extra esophageal")  GERD from wide swings in gastric pressure that occur with throat clearing, often  promoting self use of mint and menthol lozenges that reduce the lower esophageal sphincter tone and exacerbate the problem further in a cyclical fashion.   These are the same pts (now being labeled as having "irritable larynx syndrome" by some cough centers) who not infrequently have a history of having failed to tolerate ace inhibitors,  dry powder inhalers or biphosphonates or report having atypical/extraesophageal reflux symptoms that don't respond to standard doses of PPI  and are easily confused as having aecopd or asthma flares by even experienced allergists/ pulmonologists (myself included).   rec as above plus 1st gen H1 blockers per guidelines  Plus one dose depomedrol for any Th-2 mediated upper or lower airways inflammation.  F/u in 6 months -call sooner if needed

## 2021-02-26 NOTE — Progress Notes (Signed)
Grant [name], male    DOB: 09/23/1951      MRN: [id number]    Brief patient profile:  70 yowm quit smoking 1981 with no cough or sob at that point and then developed prolonged cough in winter while working in ? [name] basement" at [facility] [employer] [facility] [employer] around 2005 and q winter since then same pattern but became more of a chronic year round pattern around 2015  Some  worse esp in winter usually better in summer mostly dry but more productive since summer of 2019 variably purulent.   Patient timeline listed below: 06/10/2018-[facility]-cough and congestion 06/25/2018-flu shot and Prevnar 07/22/2018- returned from beach with bad cough 07/26/2018- echo with [name] 08/11/2018-[facility]-cough with Depo shot 08/20/2018-[facility] 08/24/2018-[facility] cough and Depo shot 09/22/18 -[facility]-Z-Pak and Depo-Medrol injection 12/30/2019[name]- Depo-Medrol and Levaquin      11/15/2018   1st office eval/Skylynn Burkley re cough x acute on chronic worse since late summer 2019  And turned brown just x few weeks  Chief Complaint  Patient presents with  . Pulmonary Consult    Former patient of [name]. Pt c/o cough since Sept 2019.  Cough is prod with brown sputum. He coughs the most in the morning when he wakes up. He occ wakes up in the night coughing.   assoc with sensation pnds/ best rx is depomedrol 120 mg IM  Advair rx now worse with advair and prev worse with symb 160 but hfa poor baseline zpak now Med list not accurate No increase chronic doe x  MMRC3 = can't walk 100 yards even at a slow pace at a flat grade s stopping due to sob     [name] Reflux v Neurogenic Cough Differentiator Reflux Comments  Do you awaken from a sound sleep coughing violently?                            With trouble breathing? Some nights Unless cough med    Do you have choking episodes when you cannot  Get enough air, gasping for air ?               Yes   Do you usually cough when you lie down into  The bed, or when you just lie down to rest ?                          < 45 degrees start coughing   Do you usually cough after meals or eating?         Big meal   Do you cough when (or after) you bend over?    No    GERD SCORE     [name] Reflux v Neurogenic Cough Differentiator Neurogenic   Do you more-or-less cough all day long? All day   Does change of temperature make you cough? No    Does laughing or chuckling cause you to cough? sometimes   Do fumes (perfume, automobile fumes, burned  Toast, etc.,) cause you to cough ?      no   Does speaking, singing, or talking on the phone cause you to cough   ?               Talking    Neurogenic/Airway score        No obvious other patterns in day to day or  daytime variability or assoc   mucus plugs or hemoptysis or cp or chest tightness, subjective wheeze or overt   hb symptoms ( on ppi bid but not ac)     rec If the mucus doesn't turn clear >>> omnicef 300 mg twice daily x 10 days For cough / congestion >  mucinex dm 1200 mg every 12 hours supplement with tessilon and no delsym  Depomedrol 120 mg IM  Symbicort 80 Take 2 puffs first thing in am and then another 2 puffs about 12 hours later.  Only use your levoalbuterol as a rescue medication  Please schedule a follow up office visit in 2 weeks, call sooner if needed with all medications /inhalers/ solutions in hand    11/29/2018  f/u ov/Grant [name] re: chronic throat clearing x decades/  cough worse since summer 2019 / confused with meds  Chief Complaint  Patient presents with  . Follow-up    Cough has improved slightly. He uses his xopenex and ventolin every few days.   Dyspnea: denies now but very sedentary  Cough: min improved  Sleeping: no bed blocks  SABA use: using maint alb and xopenex  02: none / cpap does  fine per [name]  rec Delsym two tsp twice daily automatically  Symbicort 160 Take 2 puffs first thing in am and then  another 2 puffs about 12 hours later.  Only use your albuterol and levoalbuterol as a rescue medication Omeprazole 40 mg Take 30- 60 min before your first and last meals of the day  GERD diet  See [name] w/in 2 weeks with all your medications,   - late: add add gabapentin 100 tid if still throat cleairng on delsym 2 tsp bid and tessalon prn  Improved by 12/16/18 and given medication calendar  NP eval and recs  12/20/18 acutely worse cough  Mucinex Twice daily  As needed  Cough  Deslym 2 tsp Twice daily  As needed  Cough Tessalon Three times a day  As needed  Cough .  Try Chlortabs 4mg  Twice daily  As needed  Drainage , throat clearing , drippy nose.  Steroid injection today .   06/05/19 another flare x 10 days > zpak / depomedrol    07/07/2019  f/u ov/Grant [name] re: daily cough x 2015 best rx p ov 11/29/18 but note worse again by 12/20/18 and really never stopped throat clearing.  Chief Complaint  Patient presents with  . Acute office visit     Cough has been worse over the past few wks and is occ prod with brown sputum. He is using his albuterol inhaler 1-2 x per day on average.   Dyspnea:  Doe x 50 ft  Cough: w/in a few hours of getting out of bed starts up coughing with min mucus production slt brown and assoc with his "usual" sense of pnds but nothing purulent from nose   Sleeping: bed blocks and cpap tends to sleep fine  SABA use: up to twice daily at most plus symb 160 2bid maint and hfa 75% effective technique  02: none  rec Augmentin 875 mg take one pill twice daily  X 10 days   Depomedrol 120 mg  IM today  GERD diet For cough >  mucinex dm 1200 mg every 12 hours as needed and if still coughing add the tessalon to it. See calendar for specific medication    09/11/2020  f/u ov/Grant [name] re: cough 2005  symbicort 160  2 q 12 h /  covid vaccine x 3 doses Chief Complaint  Patient presents with  . Follow-up    Cough is about the same since his last visit. Breathing is unchanged. He is  using his albuterol inhaler 5-6 x per wk on average.   Dyspnea:  MMRC2 = can't walk a nl pace on a flat grade s sob but does fine slow and flat  Cough sporadic but daytime min mucoid  Sleeping: on cpap, cough is less and a few hours after taking it off and stirring starts up again  SABA use: as above  02:none  rec For cough >  mucinex dm 1200 mg every 12 hours as needed and if still coughing add the tessalon to it. For drainage  > chlorpheniramine up to every 4 hours as needed  Depomedrol 120 mg IM today  Sinus CT will be scheduled and call the results  Please schedule a follow up office visit in 6 weeks, call sooner if needed with all medications /inhalers/ solutions   Admit date: 10/10/2020 Discharge date: 10/13/2020  Brief/Interim Summary: 69 y.o.malewith medical history significant ofrecurrent diverticulitis, asthma, depression, status post aortic valve replacement in 2018, fibromyalgia, GERD, chronic migraines, obstructive sleep apnea, hypothyroidism and paroxysmal atrial fibrillation who presented to the ER with abdominal pain with nausea and an episode of vomiting. On presentation, he had a temperature of 100.1, tachycardic, respiratory rate of 20 with white count of 16.2 and lactic acid of 2.4. CT of the abdomen and pelvis showed sigmoid diverticulitis. He was admitted for sepsis secondary to sigmoid diverticulitis and started on IV fluids and antibiotics. During the hospitalization, his condition has improved. He is tolerating diet with improving abdominal pain. He is hemodynamically stable with no increased leukocytosis. He will be discharged home on oral antibiotics.  Discharge Diagnoses:  Sepsis: Present on admission Acute sigmoid diverticulitis Leukocytosis -Patient presented with a temperature of 100.1, tachycardic, respiratory rate of 20 with white count of 16.2, lactic acid of 2.4 most likely secondary to sigmoid diverticulitis as evident on CT of the abdomen and  pelvis. -Afebrile over the last 24 hours.Leukocytosis has resolved. CRP improving to 5.6 today. -Currently on Rocephin and Flagyl.  - He is tolerating diet with improving abdominal pain. He is hemodynamically stable -Discharge home today on oral ciprofloxacin and Flagyl for 10 days. -will need outpatient follow-up with GI in 6 to 8 weeks time -Sepsis has resolved  Essential hypertension -Blood pressure stable. Continue home regimen.  Hypothyroidism -Continue levothyroxine and liothyronine  Paroxysmal atrial fibrillation--rate mostly controlled. Continue Xarelto  GERD -Continue Protonix  OSA -Continue CPAP at night  Morbid obesity -Outpatient follow-up  ? Complicated migraine history versus panic attack versus? Seizure -Patient has history of abnormal body movements specially in stressful condition and has followed up with neurology in the past who thinks that patient has complicated migraine. Patient needed to have IV Ativan during the hospitalization and wife thought that he was panicking and improved with Ativan. Will need outpatient neurology follow-up. Will not start any seizure medications for now. Continue as needed triptan -will send home on few days of oral Xanax as needed     11/20/2020  f/u ov/Grant [name] re:  Cough x 2005  Chief Complaint  Patient presents with  . Follow-up    Patient states that he is doing much better since last visit. No concerns at this time.   Dyspnea: was doing 2 miles  Baseline - now a block or two  Cough: not bad / some noct x 2 /  week Sleeping: on bed blocks / cpap most nights sleeping better  SABA use: rarely using  02: none  rec No change medications at this point  See calendar for specific medication instructions    02/26/2021  f/u ov/Grant [name] re:  OSA/ cough x 2005 / considering abd surgery - trying to lose wt/ has med calendar but not being updated  Chief Complaint  Patient presents with  . Follow-up    Having trouble  with allergies, lasted about a week. Feeling better now. Symbicort working well. Using albuterol about 1x a day if needed.    Dyspnea:  Walking the neighborhood up 2 miles including [place]  Cough: was fine until the pollen / had sense of throat swelling/ better p depomedrol  Sleeping: on bed blocks/ cpap 100% on nights 10 h /night with AHI  1.7 on 16 cm h20 SABA use: none during the day  02: none  Covid status:  vax  X  3    No obvious day to day or daytime variability or assoc excess/ purulent sputum or mucus plugs or hemoptysis or cp or chest tightness, subjective wheeze or overt sinus or hb symptoms.     Also denies any obvious fluctuation of symptoms with weather or environmental changes or other aggravating or alleviating factors except as outlined above   No unusual exposure hx or h/o childhood pna/ asthma or knowledge of premature birth.  Current Allergies, Complete Past Medical History, Past Surgical History, Family History, and Social History were reviewed in [employer] record.  ROS  The following are not active complaints unless bolded Hoarseness, sore throat, dysphagia, dental problems, itching, sneezing,  nasal congestion or discharge of excess mucus or purulent secretions, ear ache,   fever, chills, sweats, unintended wt loss or wt gain, classically pleuritic or exertional cp,  orthopnea pnd or arm/hand swelling  or leg swelling, presyncope, palpitations, abdominal pain, anorexia, nausea, vomiting, diarrhea  or change in bowel habits or change in bladder habits, change in stools or change in urine, dysuria, hematuria,  rash, arthralgias, visual complaints, headache, numbness, weakness or ataxia or problems with walking or coordination,  change in mood or  memory.        Current Meds  Medication Sig  . acetaminophen (TYLENOL) 500 MG tablet Take 1,000 mg by mouth every 8 (eight) hours as needed for mild pain, fever or headache. Per bottle as needed  .  albuterol (VENTOLIN HFA) 108 (90 Base) MCG/ACT inhaler INHALE 2 PUFFS INTO THE LUNGS EVERY 6 HOURS AS NEEDED FOR WHEEZING OR SHORTNESS OF BREATH (Patient taking differently: Inhale 2 puffs into the lungs every 6 (six) hours as needed for wheezing or shortness of breath.)  . ALPRAZolam (XANAX) 0.25 MG tablet Take 1 tablet (0.25 mg total) by mouth 3 (three) times daily as needed for anxiety.  Marland Kitchen aluminum hydroxide-magnesium carbonate (GAVISCON) 95-358 MG/15ML SUSP Take 15 mLs by mouth as needed for indigestion or heartburn.  Marland Kitchen amoxicillin (AMOXIL) 500 MG capsule Take 4 capsules (2,000 mg total) by mouth See admin instructions. 1 hour prior to dental procedure  . azelastine (ASTELIN) 137 MCG/SPRAY nasal spray Place 1-2 sprays into the nose 2 (two) times daily as needed for rhinitis or allergies.   . benzonatate (TESSALON) 200 MG capsule Take 1 capsule (200 mg total) by mouth 3 (three) times daily as needed for cough.  . calcium carbonate (TUMS - DOSED IN MG ELEMENTAL CALCIUM) 500 MG chewable tablet Chew 500-1,000 mg by mouth daily as  needed for indigestion or heartburn.  . chlorpheniramine (CHLOR-TRIMETON) 4 MG tablet Take 4 mg by mouth every 6 (six) hours as needed for allergies.  . clobetasol ointment (TEMOVATE) 2.68 % Apply 1 application topically 2 (two) times daily as needed (rash).  . COLCRYS 0.6 MG tablet Take 0.6 mg by mouth daily.   Marland Kitchen Dextromethorphan-guaiFENesin (MUCINEX DM MAXIMUM STRENGTH) 60-1200 MG TB12 Take 1 tablet by mouth 2 (two) times daily as needed (congestion/cough).  Marland Kitchen DHEA 50 MG TABS Take 50 mg by mouth daily with breakfast.   . diphenhydrAMINE (BENADRYL) 25 MG tablet Take 25 mg by mouth daily as needed for allergies.  Marland Kitchen EPINEPHrine 0.3 mg/0.3 mL IJ SOAJ injection Inject 0.3 mLs (0.3 mg total) into the muscle as needed (for angioedema).  Eduard Roux (AIMOVIG) 140 MG/ML SOAJ Inject 140 mg into the skin every 28 (twenty-eight) days.  . Ferrous Sulfate (IRON) 142 (45 Fe) MG TBCR  Take 142 mg by mouth in the morning and at bedtime.  . finasteride (PROSCAR) 5 MG tablet Take 5 mg by mouth at bedtime.   Marland Kitchen levothyroxine (SYNTHROID, LEVOTHROID) 112 MCG tablet Take 112 mcg by mouth daily before breakfast.   . liothyronine (CYTOMEL) 25 MCG tablet Take 25 mcg by mouth daily before breakfast.  . losartan (COZAAR) 50 MG tablet TAKE 1 TABLET BY MOUTH EVERY DAY (Patient taking differently: Take 50 mg by mouth daily.)  . Menthol, Topical Analgesic, (BLUE-EMU MAXIMUM STRENGTH EX) Apply 1 application topically as needed (pain).  . Methylcellulose, Laxative, (FIBER THERAPY) 500 MG TABS Take 500-1,000 mg by mouth daily.  . metoprolol tartrate (LOPRESSOR) 50 MG tablet TAKE 1 TABLET(50 MG) BY MOUTH TWICE DAILY (Patient taking differently: Take 50 mg by mouth 2 (two) times daily.)  . Multiple Vitamin (MULTIVITAMIN WITH MINERALS) TABS tablet Take 1 tablet by mouth daily.  Marland Kitchen olopatadine (PATANOL) 0.1 % ophthalmic solution Place 1 drop into both eyes 2 (two) times daily as needed for allergies.  Marland Kitchen omeprazole (PRILOSEC) 40 MG capsule Take 1 capsule (40 mg total) by mouth 2 (two) times daily.  . rivaroxaban (XARELTO) 20 MG TABS tablet Take 1 tablet (20 mg total) by mouth daily with supper.  . rizatriptan (MAXALT) 10 MG tablet Take 1 tablet (10 mg total) by mouth as needed for migraine. May repeat in 2 hours if needed  . saccharomyces boulardii (FLORASTOR) 250 MG capsule Take 250 mg by mouth daily as needed (when taking antibiotics).  . sodium chloride (OCEAN) 0.65 % SOLN nasal spray Place 1 spray into both nostrils as needed for congestion.  . SYMBICORT 160-4.5 MCG/ACT inhaler INHALE 2 PUFFS INTO THE LUNGS TWICE DAILY  . tamsulosin (FLOMAX) 0.4 MG CAPS capsule Take 0.4 mg by mouth daily after supper.  . testosterone cypionate (DEPOTESTOSTERONE CYPIONATE) 200 MG/ML injection Inject 200 mg into the muscle once a week.  . venlafaxine (EFFEXOR-XR) 150 MG 24 hr capsule Take 150 mg by mouth daily with  breakfast.               Objective:      02/26/2021     236 11/20/2020         270 09/11/2020     280  07/07/2019       265   11/29/18 272 lb (123.4 kg)  11/15/18 274 lb (124.3 kg)  11/08/18 267 lb (121.1 kg)    Vital signs reviewed  02/26/2021  - Note at rest 02 sats  96% on RA   General appearance:  amb wm with freq throat clearing/ mild pseudowheeze   HEENT : pt wearing mask not removed for exam due to covid -19 concerns.    NECK :  without JVD/Nodes/TM/ nl carotid upstrokes bilaterally   LUNGS: no acc muscle use,  Nl contour chest which is clear to A and P bilaterally without cough on insp or exp maneuvers   CV:  RRR  no s3 or murmur or increase in P2, and no edema   ABD:  soft and nontender with nl inspiratory excursion in the supine position. No bruits or organomegaly appreciated, bowel sounds nl  MS:  Nl gait/ ext warm without deformities, calf tenderness, cyanosis or clubbing No obvious joint restrictions   SKIN: warm and dry without lesions    NEURO:  alert, approp, nl sensorium with  no motor or cerebellar deficits apparent.                 Assessment

## 2021-02-26 NOTE — Patient Instructions (Addendum)
Try reducing symbicort to 80 Take 2 puffs first thing in am and then another 2 puffs about 12 hours later.   Depomedrol 120 mg IM today   Only use your albuterol as a rescue medication to be used if you can't catch your breath by resting or doing a relaxed purse lip breathing pattern.  - The less you use it, the better it will work when you need it. - Ok to use up to 2 puffs  every 4 hours if you must but call for immediate appointment if use goes up over your usual need - Don't leave home without it !!  (think of it like the spare tire for your car)    For drainage / throat tickle try take CHLORPHENIRAMINE  4 mg  (Chlortab 4mg   at [employer] should be easiest to find in the green box)  take one every 4 hours as needed - available over the counter- may cause drowsiness so start with just a dose or two an hour before bedtime and see how you tolerate it before trying in daytime  Up to 2 every 4 hours     Please schedule a follow up visit in 6 months but call sooner if needed

## 2021-02-28 DIAGNOSIS — R351 Nocturia: Secondary | ICD-10-CM | POA: Diagnosis not present

## 2021-02-28 DIAGNOSIS — N401 Enlarged prostate with lower urinary tract symptoms: Secondary | ICD-10-CM | POA: Diagnosis not present

## 2021-02-28 DIAGNOSIS — R310 Gross hematuria: Secondary | ICD-10-CM | POA: Diagnosis not present

## 2021-03-01 ENCOUNTER — Telehealth: Payer: Self-pay | Admitting: Cardiovascular Disease

## 2021-03-01 NOTE — Telephone Encounter (Signed)
Returned call to [name].  Pt had recent colonoscopy and is going to require surgery to "remove 1.5 feet of his intestines".  Per wife he has an appointment Mar 12, 2021 with Dr. Catalina Antigua (?) to discuss this procedure.  Per wife, she would like to have Pt evaluated by Dr. [name] prior to this procedure.  She states Pt has lost 60 pounds since January 2022.  States that he has dizziness and feels bad first thing in the morning after he takes his medications.  She is worried that perhaps he is on too much blood pressure medication with his recent weight loss.  Per wife Pt is also continuously moving arms and legs (this has been evaluted by neurology).  Wife would appreciate if Dr. [name] could see Pt prior to his pending surgery.  Advised wife, would see if Dr. [name] could work Pt in.  Will return call.

## 2021-03-01 NOTE — Telephone Encounter (Signed)
New message:     Patient wife calling stating that her husband has been having some issues like dizzy,tired and twitching and making strange sounds with his teeth. I asked about the BP she states it is fine. Please call patient wife and advise.

## 2021-03-04 NOTE — Telephone Encounter (Signed)
Spoke with pt's wife, Juliann Pulse, [place] and advised per Dr [name] pt should not need clearance or to see Dr [name] prior to surgery.  [name] verbalizes understanding and thanked Therapist, sports for the call.

## 2021-03-05 DIAGNOSIS — G4733 Obstructive sleep apnea (adult) (pediatric): Secondary | ICD-10-CM | POA: Diagnosis not present

## 2021-03-12 DIAGNOSIS — K56699 Other intestinal obstruction unspecified as to partial versus complete obstruction: Secondary | ICD-10-CM | POA: Diagnosis not present

## 2021-03-12 DIAGNOSIS — K5730 Diverticulosis of large intestine without perforation or abscess without bleeding: Secondary | ICD-10-CM | POA: Diagnosis not present

## 2021-03-14 ENCOUNTER — Telehealth: Payer: Self-pay | Admitting: Adult Health

## 2021-03-14 NOTE — Telephone Encounter (Signed)
Called and spoke with patient's wife Grant [name]. She stated that she had called to make an appt to follow up on the patient's cough and the sooner appt wasn't until 5/31. She wanted to know if [facility] or [facility] had any sooner appts. I looked at both [facility] and [facility]'s schedules for tomorrow and they are booked solid. [facility] did have an opening at 430pm. I offered this to them but they stated that they appreciated the offer but they did not want to establish with another provider outside of [facility] and [facility].   She is aware to call our office if anything changes between now and Tuesday.   Nothing further needed at time of call.

## 2021-03-19 ENCOUNTER — Ambulatory Visit: Payer: Medicare Other | Admitting: Adult Health

## 2021-03-19 ENCOUNTER — Other Ambulatory Visit: Payer: Self-pay

## 2021-03-19 ENCOUNTER — Encounter: Payer: Self-pay | Admitting: Adult Health

## 2021-03-19 DIAGNOSIS — K5732 Diverticulitis of large intestine without perforation or abscess without bleeding: Secondary | ICD-10-CM | POA: Insufficient documentation

## 2021-03-19 DIAGNOSIS — D6869 Other thrombophilia: Secondary | ICD-10-CM | POA: Insufficient documentation

## 2021-03-19 DIAGNOSIS — M109 Gout, unspecified: Secondary | ICD-10-CM | POA: Insufficient documentation

## 2021-03-19 DIAGNOSIS — J45991 Cough variant asthma: Secondary | ICD-10-CM | POA: Diagnosis not present

## 2021-03-19 DIAGNOSIS — D6859 Other primary thrombophilia: Secondary | ICD-10-CM | POA: Insufficient documentation

## 2021-03-19 DIAGNOSIS — Z860100 Personal history of colon polyps, unspecified: Secondary | ICD-10-CM | POA: Insufficient documentation

## 2021-03-19 DIAGNOSIS — E291 Testicular hypofunction: Secondary | ICD-10-CM | POA: Insufficient documentation

## 2021-03-19 DIAGNOSIS — F419 Anxiety disorder, unspecified: Secondary | ICD-10-CM | POA: Insufficient documentation

## 2021-03-19 DIAGNOSIS — I700 Atherosclerosis of aorta: Secondary | ICD-10-CM | POA: Insufficient documentation

## 2021-03-19 DIAGNOSIS — F3340 Major depressive disorder, recurrent, in remission, unspecified: Secondary | ICD-10-CM | POA: Insufficient documentation

## 2021-03-19 DIAGNOSIS — J45909 Unspecified asthma, uncomplicated: Secondary | ICD-10-CM | POA: Diagnosis not present

## 2021-03-19 DIAGNOSIS — G43909 Migraine, unspecified, not intractable, without status migrainosus: Secondary | ICD-10-CM | POA: Insufficient documentation

## 2021-03-19 DIAGNOSIS — D509 Iron deficiency anemia, unspecified: Secondary | ICD-10-CM | POA: Insufficient documentation

## 2021-03-19 DIAGNOSIS — E782 Mixed hyperlipidemia: Secondary | ICD-10-CM | POA: Insufficient documentation

## 2021-03-19 MED ORDER — BUDESONIDE-FORMOTEROL FUMARATE 160-4.5 MCG/ACT IN AERO: 2.0000 | INHALATION_SPRAY | Freq: Two times a day (BID) | RESPIRATORY_TRACT | 5 refills | Status: DC

## 2021-03-19 MED ORDER — BENZONATATE 200 MG PO CAPS: 200.0000 mg | ORAL_CAPSULE | Freq: Three times a day (TID) | ORAL | 3 refills | Status: DC | PRN

## 2021-03-19 NOTE — Patient Instructions (Addendum)
Increase Symbicort 160 2 puffs Twice daily  , rinse after use.  Deslym 2 tsp Twice daily  As needed  Cough Tessalon Three times a day  As needed  Cough .  Follow up with Dr. Melvyn Novas as planned and As needed   Please contact office for sooner follow up if symptoms do not improve or worsen or seek emergency care

## 2021-03-19 NOTE — Progress Notes (Signed)
@Patient  ID: Grant [name], male    DOB: 04-07-1951, 70 y.o.   MRN: [id number]  Chief Complaint  Patient presents with  . Follow-up    Referring provider: Shirline Frees, MD  HPI: 70 year old male former smoker followed for asthma and chronic bronchitis, allergic rhinitis, obstructive sleep apnea and chronic cough Medical history significant for A. fib on chronic anticoagulation, depression, angioedema, aortic valve replacement, toxic encephalopathy and chronic fatigue with fibromyalgia.  TEST/EVENTS :  PFT 06/01/17-WNL-FVC 4.46/96%, FEV1 3.71/106%, ratio 0.83, FEF 25-75%4.64/167%, no response to BD , TLC 107%, DLCO 101%  NPSG 03/24/03-AHI 88/ hr, desaturation to 75%, body weight 244 lbs  03/19/2021 Follow up : Asthma and Chronic cough  Patient returns for a 2-week follow-up.  Patient was seen last visit with a asthmatic flare.  Was given a Depo-Medrol steroid injection. Symbicort was decreased 80.  Since last visit patient is feeling about the same cough has been worse since Symbicort dose was decreased.  Patient is unable to take oral steroids.  Says that cough is dry can be severe at times. He has been using some Mucinex without much relief. Denies any fever, discolored mucus, chest pain, orthopnea or edema.  Does take Chlor-Trimeton as needed Patient has CT chest December 2021 that showed no acute pulmonary process.  Allergies  Allergen Reactions  . Perphenazine Other (See Comments)    Tongue swelling  . Amiodarone Swelling  . Codeine Itching and Other (See Comments)  . Other     Other reaction(s): Unknown  . Tramadol Other (See Comments)    dellusion     Immunization History  Administered Date(s) Administered  . Fluad Quad(high Dose 65+) 07/07/2019  . Influenza, High Dose Seasonal PF 06/25/2018  . Influenza,inj,Quad PF,6+ Mos 10/31/2016, 07/12/2017  . Influenza-Unspecified 07/12/2020  . PFIZER(Purple Top)SARS-COV-2 Vaccination 11/26/2019, 12/21/2019, 07/12/2020   . Pneumococcal Conjugate-13 06/25/2018  . Pneumococcal Polysaccharide-23 07/12/2017  . Zoster, Live 10/01/2020    Past Medical History:  Diagnosis Date  . Allergic rhinitis   . Anxiety   . Arthritis   . Asthma   . Bladder tumor   . Chronic fatigue   . Depression    06/30/17 Pt denies being depressed, reports Effexor is taken for Chronic Fatigue   . Dyspnea   . Enlarged prostate   . Fibromyalgia   . GERD (gastroesophageal reflux disease)   . Headache   . History of chronic bronchitis   . History of migraine   . History of toxic encephalopathy   . Hypothyroidism   . OSA on CPAP    CPAP 14  . PAF (paroxysmal atrial fibrillation) ([facility]) CARDIOLOGIST -- DR [name]   DX OCT 2013  . S/P AVR (aortic valve replacement) and aortoplasty   . 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 Unspecified essential hypertension   . Urethral tumor    PROSTATIC    Tobacco History: Social History   Tobacco Use  Smoking Status Former Smoker  . Packs/day: 0.50  . Years: 27.00  . Pack years: 13.50  . Types: Cigarettes  . Quit date: 10/21/1979  . Years since quitting: 41.4  Smokeless Tobacco Never Used   Counseling given: Not Answered   Outpatient Medications Prior to Visit  Medication Sig Dispense Refill  . acetaminophen (TYLENOL) 500 MG tablet Take 1,000 mg by mouth every 8 (eight) hours as needed for mild pain, fever or headache. Per bottle as needed    . albuterol (VENTOLIN HFA) 108 (90 Base) MCG/ACT inhaler INHALE 2 PUFFS  INTO THE LUNGS EVERY 6 HOURS AS NEEDED FOR WHEEZING OR SHORTNESS OF BREATH (Patient taking differently: Inhale 2 puffs into the lungs every 6 (six) hours as needed for wheezing or shortness of breath.) 18 g 5  . ALPRAZolam (XANAX) 0.25 MG tablet Take 1 tablet (0.25 mg total) by mouth 3 (three) times daily as needed for anxiety. 15 tablet 0  . aluminum hydroxide-magnesium carbonate (GAVISCON) 95-358 MG/15ML SUSP Take 15 mLs by mouth as needed for indigestion or heartburn.    Marland Kitchen  amoxicillin (AMOXIL) 500 MG capsule Take 4 capsules (2,000 mg total) by mouth See admin instructions. 1 hour prior to dental procedure 8 capsule 5  . azelastine (ASTELIN) 137 MCG/SPRAY nasal spray Place 1-2 sprays into the nose 2 (two) times daily as needed for rhinitis or allergies.     . benzonatate (TESSALON) 200 MG capsule Take 1 capsule (200 mg total) by mouth 3 (three) times daily as needed for cough. 30 capsule 1  . budesonide-formoterol (SYMBICORT) 80-4.5 MCG/ACT inhaler Take 2 puffs first thing in am and then another 2 puffs about 12 hours later. 1 each 12  . calcium carbonate (TUMS - DOSED IN MG ELEMENTAL CALCIUM) 500 MG chewable tablet Chew 500-1,000 mg by mouth daily as needed for indigestion or heartburn.    . chlorpheniramine (CHLOR-TRIMETON) 4 MG tablet Take 4 mg by mouth every 6 (six) hours as needed for allergies.    . clobetasol ointment (TEMOVATE) 7.01 % Apply 1 application topically 2 (two) times daily as needed (rash).    . COLCRYS 0.6 MG tablet Take 0.6 mg by mouth daily.     Marland Kitchen Dextromethorphan-guaiFENesin (MUCINEX DM MAXIMUM STRENGTH) 60-1200 MG TB12 Take 1 tablet by mouth 2 (two) times daily as needed (congestion/cough).    Marland Kitchen DHEA 50 MG TABS Take 50 mg by mouth daily with breakfast.     . diphenhydrAMINE (BENADRYL) 25 MG tablet Take 25 mg by mouth daily as needed for allergies.    Marland Kitchen EPINEPHrine 0.3 mg/0.3 mL IJ SOAJ injection Inject 0.3 mLs (0.3 mg total) into the muscle as needed (for angioedema). 2 Device 0  . Erenumab-aooe (AIMOVIG) 140 MG/ML SOAJ Inject 140 mg into the skin every 28 (twenty-eight) days. 1 mL 11  . Ferrous Sulfate (IRON) 142 (45 Fe) MG TBCR Take 142 mg by mouth in the morning and at bedtime.    . finasteride (PROSCAR) 5 MG tablet Take 5 mg by mouth at bedtime.     Marland Kitchen levothyroxine (SYNTHROID, LEVOTHROID) 112 MCG tablet Take 112 mcg by mouth daily before breakfast.     . liothyronine (CYTOMEL) 25 MCG tablet Take 25 mcg by mouth daily before breakfast.    .  losartan (COZAAR) 50 MG tablet TAKE 1 TABLET BY MOUTH EVERY DAY (Patient taking differently: Take 50 mg by mouth daily.) 30 tablet 6  . Menthol, Topical Analgesic, (BLUE-EMU MAXIMUM STRENGTH EX) Apply 1 application topically as needed (pain).    . Methylcellulose, Laxative, (FIBER THERAPY) 500 MG TABS Take 500-1,000 mg by mouth daily.    . metoprolol tartrate (LOPRESSOR) 50 MG tablet TAKE 1 TABLET(50 MG) BY MOUTH TWICE DAILY (Patient taking differently: Take 50 mg by mouth 2 (two) times daily.) 180 tablet 3  . Multiple Vitamin (MULTIVITAMIN WITH MINERALS) TABS tablet Take 1 tablet by mouth daily.    Marland Kitchen olopatadine (PATANOL) 0.1 % ophthalmic solution Place 1 drop into both eyes 2 (two) times daily as needed for allergies.    Marland Kitchen omeprazole (PRILOSEC) 40 MG  capsule Take 1 capsule (40 mg total) by mouth 2 (two) times daily. 180 capsule 1  . rivaroxaban (XARELTO) 20 MG TABS tablet Take 1 tablet (20 mg total) by mouth daily with supper. 90 tablet 3  . rizatriptan (MAXALT) 10 MG tablet Take 1 tablet (10 mg total) by mouth as needed for migraine. May repeat in 2 hours if needed 10 tablet 11  . saccharomyces boulardii (FLORASTOR) 250 MG capsule Take 250 mg by mouth daily as needed (when taking antibiotics).    . sodium chloride (OCEAN) 0.65 % SOLN nasal spray Place 1 spray into both nostrils as needed for congestion.    . tamsulosin (FLOMAX) 0.4 MG CAPS capsule Take 0.4 mg by mouth daily after supper.    . Testosterone Cypionate 200 MG/ML SOLN Inject 100 Units as directed once a week.    . venlafaxine (EFFEXOR-XR) 150 MG 24 hr capsule Take 150 mg by mouth daily with breakfast.    . penicillin v potassium (VEETID) 500 MG tablet Take 500 mg by mouth 4 (four) times daily. (Patient not taking: Reported on 02/26/2021)    . testosterone cypionate (DEPOTESTOSTERONE CYPIONATE) 200 MG/ML injection Inject 200 mg into the muscle once a week.     No facility-administered medications prior to visit.     Review of  Systems:   Constitutional:   No  weight loss, night sweats,  Fevers, chills,  +fatigue, or  lassitude.  HEENT:   No headaches,  Difficulty swallowing,  Tooth/dental problems, or  Sore throat,                No sneezing, itching, ear ache,  +nasal congestion, post nasal drip,   CV:  No chest pain,  Orthopnea, PND, swelling in lower extremities, anasarca, dizziness, palpitations, syncope.   GI  No heartburn, indigestion, abdominal pain, nausea, vomiting, diarrhea, change in bowel habits, loss of appetite, bloody stools.   Resp:    No chest wall deformity  Skin: no rash or lesions.  GU: no dysuria, change in color of urine, no urgency or frequency.  No flank pain, no hematuria   MS:  No joint pain or swelling.  No decreased range of motion.  No back pain.    Physical Exam  BP (!) 144/100   Pulse 71   Ht 5\' 10"  (1.778 m)   Wt 238 lb (108 kg)   SpO2 97%   BMI 34.15 kg/m   GEN: A/Ox3; pleasant , NAD,    HEENT:  [place]/AT,  NOSE-clear, THROAT-clear, no lesions, no postnasal drip or exudate noted.   NECK:  Supple w/ fair ROM; no JVD; normal carotid impulses w/o bruits; no thyromegaly or nodules palpated; no lymphadenopathy.    RESP  Clear  P & A; w/o, wheezes/ rales/ or rhonchi. no accessory muscle use, no dullness to percussion  CARD:  RRR, no m/r/g, no peripheral edema, pulses intact, no cyanosis or clubbing.  GI:   Soft & nt; nml bowel sounds; no organomegaly or masses detected.   Musco: Warm bil, no deformities or joint swelling noted.   Neuro: alert, no focal deficits noted.    Skin: Warm, no lesions or rashes     PFT Results Latest Ref Rng & Units 06/01/2017  FVC-Pre L 4.31  FVC-Predicted Pre % 92  FVC-Post L 4.46  FVC-Predicted Post % 96  Pre FEV1/FVC % % 81  Post FEV1/FCV % % 83  FEV1-Pre L 3.49  FEV1-Predicted Pre % 100  FEV1-Post L 3.71  DLCO  uncorrected ml/min/mmHg 32.71  DLCO [facility]% % 101  DLVA Predicted % 98  TLC L 7.52  TLC % Predicted % 107  RV %  Predicted % 124    Lab Results  Component Value Date   NITRICOXIDE 28 11/15/2018        Assessment & Plan:   No problem-specific Assessment & Plan notes found for this encounter.     Rexene Edison, NP 03/19/2021

## 2021-03-19 NOTE — Assessment & Plan Note (Signed)
Slow to resolve flare.  Increased symptom burden on lower dose of Symbicort.  Will increase back to Symbicort 162 puffs twice daily.  Oral inhaler care discussed  Plan  Patient Instructions  Increase Symbicort 160 2 puffs Twice daily  , rinse after use.  Deslym 2 tsp Twice daily  As needed  Cough Tessalon Three times a day  As needed  Cough .  Follow up with Dr. Melvyn Novas as planned and As needed   Please contact office for sooner follow up if symptoms do not improve or worsen or seek emergency care

## 2021-03-19 NOTE — Assessment & Plan Note (Signed)
Chronic cough-would continue on trigger prevention.  Add in cough control regimen with Delsym and Tessalon Perles  Plan  Patient Instructions  Increase Symbicort 160 2 puffs Twice daily  , rinse after use.  Deslym 2 tsp Twice daily  As needed  Cough Tessalon Three times a day  As needed  Cough .  Follow up with Dr. Melvyn Novas as planned and As needed   Please contact office for sooner follow up if symptoms do not improve or worsen or seek emergency care

## 2021-03-20 ENCOUNTER — Other Ambulatory Visit: Payer: Self-pay | Admitting: Surgery

## 2021-03-25 ENCOUNTER — Other Ambulatory Visit: Payer: Self-pay | Admitting: Surgery

## 2021-03-25 DIAGNOSIS — M25511 Pain in right shoulder: Secondary | ICD-10-CM | POA: Diagnosis not present

## 2021-03-25 DIAGNOSIS — K5792 Diverticulitis of intestine, part unspecified, without perforation or abscess without bleeding: Secondary | ICD-10-CM

## 2021-03-28 ENCOUNTER — Other Ambulatory Visit: Payer: Self-pay | Admitting: Internal Medicine

## 2021-03-28 DIAGNOSIS — E291 Testicular hypofunction: Secondary | ICD-10-CM | POA: Diagnosis not present

## 2021-03-28 DIAGNOSIS — F3341 Major depressive disorder, recurrent, in partial remission: Secondary | ICD-10-CM | POA: Diagnosis not present

## 2021-03-28 DIAGNOSIS — I10 Essential (primary) hypertension: Secondary | ICD-10-CM | POA: Diagnosis not present

## 2021-03-28 DIAGNOSIS — E039 Hypothyroidism, unspecified: Secondary | ICD-10-CM | POA: Diagnosis not present

## 2021-04-08 ENCOUNTER — Ambulatory Visit
Admission: RE | Admit: 2021-04-08 | Discharge: 2021-04-08 | Disposition: A | Discharge status: Home / Self Care | Payer: Medicare Other | Source: Ambulatory Visit | Attending: Surgery | Admitting: Surgery

## 2021-04-08 DIAGNOSIS — K5792 Diverticulitis of intestine, part unspecified, without perforation or abscess without bleeding: Secondary | ICD-10-CM

## 2021-04-09 ENCOUNTER — Ambulatory Visit: Payer: Self-pay | Admitting: Surgery

## 2021-04-09 DIAGNOSIS — K56699 Other intestinal obstruction unspecified as to partial versus complete obstruction: Secondary | ICD-10-CM | POA: Diagnosis not present

## 2021-04-09 DIAGNOSIS — K5730 Diverticulosis of large intestine without perforation or abscess without bleeding: Secondary | ICD-10-CM | POA: Diagnosis not present

## 2021-04-09 DIAGNOSIS — Z7901 Long term (current) use of anticoagulants: Secondary | ICD-10-CM | POA: Diagnosis not present

## 2021-04-09 DIAGNOSIS — G2581 Restless legs syndrome: Secondary | ICD-10-CM | POA: Diagnosis not present

## 2021-04-09 NOTE — H&P (Signed)
Grant [name] Appointment: 04/09/2021 11:30 AM Location: [facility] Patient #: [id number] DOB: 1951/05/06 Married / Language: Cleophus Molt / Race: White Male  History of Present Illness Adin Hector MD; 04/09/2021 1:04 PM) The patient is a 70 year old male who presents with diverticulitis. Note for "Diverticulitis": ` ` ` Patient sent for surgical consultation at the request of Dr Watt Climes  Chief Complaint: Recurrent diverticulitis with stricture. ` ` The patient is a pleasant active male here with his wife.  He has a [name] of health issues.  His had a heart valve replacement.  History of proximal atrial fibrillation and hypertension.  Chronically anticoagulated.  Looks like he had been Present 2018 without any obvious coronary disease.  Follow-up by Dr. [name].  Also more recently has had some leg tremors and facial lip smacking.  Seen by neurology.  No evidence of any stroke or tumor.  Following expectantly by Dr Delice Lesch with Neurology at this point.  Patient referred to me by Dr. Harlow Asa.  He is had episodes of diverticulitis involving his sigmoid colon.  Left lower quadrant pain and fevers and regular bowels.  I think he's had at least 5 attacks within the past year or so.  He had a more severe episode and was admitted December 2021 requiring IV antibiotics.  No definite abscess.  His having some urinary symptoms.  He was seen by urology.  Dr. Junious Silk apparently did cystoscopy and rule out a fistula.  Anoscopy by Dr. Watt Climes in April of this year.  Stricture noted in the sigmoid.  No definite cancer.  A more proximal adenomatous polyp noted.  A Magod strongly recommended considering surgery given the more frequent attacks and now strictures.  Patient saw Dr. Harlow Asa.  Referred to me make in my colorectal expertise.  Patient claims he moves his bowels every day.  His wife has them on a fiber supplement and probiotic.  They tend to live down in [place] better appears well.  He can walk at  least a mile without much difficulty.  Followed by cardiology with no major issues.  Has a prescription for Augmentin with refills.  Had to have a 5 day course of that last week for pain suspicious for diverticulitis.  He's had CAT scans the past 2 years showing diverticulitis in the mid sigmoid colon.  Patient also has a distant history of having a cholecystectomy and then a periumbilical incisional hernia fixed with a patch of mesh.  Patient feels sensitivity at his belly button is if the stitches are almost poking through but they never do.  Wondered if the mesh could be removed.  He does have swelling just below his sternum.  Apparently he woke up well from his cardiac surgery and pulled some of his sternal sutures.  He does have sleep apnea.  Uses CPAP.  No diabetes.  He does not smoke.  He is chronically anticoagulated on Xerelto.  His wife claims he gets quite exhausted easily and has chronic fatigue syndrome.  Apparently has reactive airway disease. Dr. Melvyn Novas with pulmonary follows  (Review of systems as stated in this history (HPI) or in the review of systems.  Otherwise all other 12 point ROS are negative) ` ` ###########################################`  This patient encounter took 45 minutes today to perform the following: obtain history, perform exam, review outside records, interpret tests & imaging, counsel the patient on their diagnosis; and, document this encounter, including findings & plan in the electronic health record (EHR).   Problem  List/Past Medical Adin Hector, MD; 04/14/2021 1:06 PM) DIASTASIS RECTI (M62.08)  INCISIONAL PAIN (L76.82)  DIVERTICULITIS, COLON (K57.32)  DIVERTICULAR STRICTURE (K56.699)  DIVERTICULAR DISEASE OF COLON (K57.30)  PREOP COLON - ENCOUNTER FOR PREOPERATIVE EXAMINATION FOR GENERAL SURGICAL PROCEDURE (Z01.818)  HISTORY OF UMBILICAL HERNIA REPAIR (Z98.890)  INCISIONAL HERNIA, WITHOUT OBSTRUCTION OR GANGRENE (K43.2)  ANTICOAGULATED (Z79.01)   RESTLESS LEG (G25.81)   Past Surgical History Carrolyn Leigh, [place]; 2021-04-14 11:39 AM) Colon Polyp Removal - Colonoscopy  Aneurysm Repair  Valve Replacement  Gallbladder Surgery - Laparoscopic  Oral Surgery   Diagnostic Studies History Carrolyn Leigh, CMA; 04/14/21 11:39 AM) Colonoscopy  within last year  Allergies Carrolyn Leigh, CMA; Apr 14, 2021 11:39 AM) Codeine/Codeine Derivatives  Difficulty breathing, Dermatitis, Diarrhea. Amiodarone HCl *ANTIARRHYTHMICS*  traMADol HCl *ANALGESICS - OPIOID*  Difficulty breathing. Perphenazine *CHEMICALS*  Allergies Reconciled   Medication History Adin Hector, MD; 14-Apr-2021 1:06 PM) Doxycycline Hyclate  (100MG  Capsule, Oral) Active. Metoprolol Tartrate  (50MG  Tablet, Oral) Active. Omeprazole  (40MG  Capsule DR, Oral) Active. Venlafaxine HCl ER  (150MG  Capsule ER 24HR, Oral) Active. Tylenol  (500MG  Capsule, 1000 mg Oral) Active. Aimovig  (140MG /ML Soln Auto-inj, Subcutaneous) Active. Ventolin HFA  (108 (90 Base)MCG/ACT Aerosol Soln, Inhalation) Active. ALPRAZolam  (0.25MG  Tablet, Oral) Active. Gaviscon  (95-358MG /15ML Suspension, Oral) Active. Amoxicillin  (500MG  Capsule, Oral) Active. Astelin  (137MCG/SPRAY Solution, Nasal) Active. Benzonatate  (100MG  Capsule, Oral) Active. Blue-Emu Super Strength  (External) Active. Symbicort  (80-4.5MCG/ACT Aerosol, Inhalation) Active. Tums  (500MG  Tablet Chewable, Oral) Active. Chlor-Trimeton  (4MG  Tablet, Oral) Active. Finasteride  (5MG  Tablet, Oral) Active. Liothyronine Sodium  (25MCG Tablet, Oral) Active. Patanol  (0.1% Solution, Ophthalmic) Active. Penicillin V Potassium  (500MG  Tablet, Oral) Active. Xarelto  (20MG  Tablet, Oral) Active. Florastor  (250MG  Packet, Oral) Active. Ocean Complete Sinus Rinse  (Nasal) Active. Tessalon  (200MG  Capsule, Oral) Active. Temovate  (0.05% Cream, External) Active. DHEA  (50MG  Capsule, Oral) Active. Benadryl  (25MG  Capsule, Oral) Active. EPINEPHrine  (0.3MG /0.3ML  Device, Injection) Active. Methylcellulose  Active. Iron  (45MG  Tablet, Oral) Active. Mucinex DM Maximum Strength  (60-1200MG  Tablet ER 12HR, Oral) Active. Multivitamin/Mineral Formula  (Oral) Active. Tamsulosin HCl  (0.4MG  Capsule, Oral) Active. Rizatriptan Benzoate  (10MG  Tablet, Oral) Active. Losartan Potassium  (50MG  Tablet, Oral) Active. Levothyroxine Sodium  (112MCG Tablet, Oral) Active. Benzonatate  (200MG  Capsule, Oral) Active. Testosterone Cypionate  (100MG /ML Solution, Intramuscular) Active. Colchicine  (0.6MG  Tablet, Oral) Active. levoFLOXacin  (500MG  Tablet, Oral) Active. Medications Reconciled  Neomycin Sulfate  (500MG  Tablet, 2 (two) Oral SEE NOTE, Taken starting 04/14/21) Active. (TAKE TWO TABLETS AT 2 PM, 3 PM, AND 10 PM THE DAY PRIOR TO SURGERY) metroNIDAZOLE  (500MG  Tablet, 2 (two) Oral three times daily, Taken starting 04/14/21) Active. (Pharmacy Instructions: Take 2 tablets at 2pm, 3pm, and 10pm the day prior to your colon operation.)  Social History Carrolyn Leigh, [place]; 2021-04-14 11:39 AM) Alcohol use  Moderate alcohol use. Caffeine use  Carbonated beverages, Tea. No caffeine use  No drug use  Tobacco use  Former smoker.  Family History Carrolyn Leigh, [place]; 14-Apr-2021 11:39 AM) Colon Polyps  Father. Heart Disease  Brother. Heart disease in male family member before age 20  Hypertension  Mother. Migraine Headache  Mother.  Other Problems Adin Hector, MD; Apr 14, 2021 1:06 PM) Atrial Fibrillation  Arthritis  Asthma  Diverticulosis  Enlarged Prostate  Back Pain  Gastroesophageal Reflux Disease  Cholelithiasis  General anesthesia - complications  Migraine Headache  Other disease, cancer, significant illness  Sleep Apnea  High blood pressure  Thyroid Disease  Transfusion history  Umbilical Hernia Repair      Physical Exam Adin Hector MD; 04/09/2021 12:57 PM)  General Mental Status - Alert. General Appearance - Not in acute distress, Not  Sickly. Orientation - Oriented X3. Hydration - Well hydrated. Voice - Normal. Note:  Pleasant and talkative. Restless. Moving legs a [name]. Smacking lips. Somewhat slow slurred speech but oriented  Integumentary Global Assessment Upon inspection and palpation of skin surfaces of the - Axillae: non-tender, no inflammation or ulceration, no drainage. and Distribution of scalp and body hair is normal. General Characteristics Temperature - normal warmth is noted.  Head and Neck Head - normocephalic, atraumatic with no lesions or palpable masses. Face Global Assessment - atraumatic, no absence of expression. Neck Global Assessment - no abnormal movements, no bruit auscultated on the right, no bruit auscultated on the left, no decreased range of motion, non-tender. Trachea - midline. Thyroid Gland Characteristics - non-tender.  Eye Eyeball - Left - Extraocular movements intact, No Nystagmus - Left. Eyeball - Right - Extraocular movements intact, No Nystagmus - Right. Cornea - Left - No Hazy - Left. Cornea - Right - No Hazy - Right. Sclera/Conjunctiva - Left - No scleral icterus, No Discharge - Left. Sclera/Conjunctiva - Right - No scleral icterus, No Discharge - Right. Pupil - Left - Direct reaction to light normal. Pupil - Right - Direct reaction to light normal.  ENMT Ears Pinna - Left - no drainage observed, no generalized tenderness observed. Pinna - Right - no drainage observed, no generalized tenderness observed. Nose and Sinuses External Inspection of the Nose - no destructive lesion observed. Inspection of the nares - Left - quiet respiration. Inspection of the nares - Right - quiet respiration. Mouth and Throat Lips - Upper Lip - no fissures observed, no pallor noted. Lower Lip - no fissures observed, no pallor noted. Nasopharynx - no discharge present. Oral Cavity/Oropharynx - Tongue - no dryness observed. Oral Mucosa - no cyanosis observed. Hypopharynx - no evidence of airway  distress observed.  Chest and Lung Exam Inspection Movements - Normal and Symmetrical. Accessory muscles - No use of accessory muscles in breathing. Palpation Palpation of the chest reveals - Non-tender. Auscultation Breath sounds - Normal and Clear.  Cardiovascular Auscultation Rhythm - Regular. Murmurs & Other Heart Sounds - Auscultation of the heart reveals - No Murmurs and No Systolic Clicks.  Abdomen Inspection Inspection of the abdomen reveals - No Visible peristalsis and No Abnormal pulsations. Umbilicus - No Bleeding, No Urine drainage. Palpation/Percussion Palpation and Percussion of the abdomen reveal - Soft, Non Tender, No Rebound tenderness, No Rigidity (guarding) and No Cutaneous hyperesthesia. Note:  Abdomen soft.  Nontender.  Not distended.  I can feel stitches at his umbilicus consistent with prior mesh repair.  There is no recurrence or erosion or irritation. No sensitivity today. Epigastric hernia just underneath the xiphoid at incision. No other incisional hernias.  No guarding.  Male Genitourinary Sexual Maturity Tanner 5 - Adult hair pattern and Adult penile size and shape.  Peripheral Vascular Upper Extremity Inspection - Left - No Cyanotic nailbeds - Left, Not Ischemic. Inspection - Right - No Cyanotic nailbeds - Right, Not Ischemic.  Neurologic Neurologic evaluation reveals  - normal attention span and ability to concentrate, able to name objects and repeat phrases. Appropriate fund of knowledge , normal sensation and normal coordination. Mental Status Affect - not angry, not paranoid. Cranial Nerves - Normal Bilaterally. Gait - Normal.  Neuropsychiatric Mental status exam performed with findings of - able to articulate well with normal speech/language, rate, volume and coherence, thought content normal with ability to perform basic computations and apply abstract reasoning and no evidence of hallucinations, delusions, obsessions or homicidal/suicidal  ideation.  Musculoskeletal Global Assessment Spine, Ribs and Pelvis - no instability, subluxation or laxity. Right Upper Extremity - no instability, subluxation or laxity.  Lymphatic Head & Neck  General Head & Neck Lymphatics: Bilateral - Description - No Localized lymphadenopathy. Axillary  General Axillary Region: Bilateral - Description - No Localized lymphadenopathy. Femoral & Inguinal  Generalized Femoral & Inguinal Lymphatics: Left - Description - No Localized lymphadenopathy. Right - Description - No Localized lymphadenopathy.    Assessment & Plan Adin Hector MD; 04/09/2021 1:04 PM)  DIVERTICULAR STRICTURE [phone]) Impression: Recurrent episodes of diverticulitis now with stricture.  He's had many attacks at least 5 in the past 2 years. One episode severe enough requiring admission last winter.  I think he would benefit from segmental colonic resection. Looking at the CAT scan, looks like it's in the mid sigmoid colon. Colonoscopy done 2 months ago disproves any malignancy. Recent urology visit argued against any obvious colovesical fistula. I do not think an enema is needed at this time, especially in light of his inability to tolerate the prep and it had to be rescheduled anyway  Think is reasonable candidate for robotic segmental resection. Most likely due firefly. Ostomy marking just encase probably not to likely.  Allow the patient and his wife to express concerns and past history. Given chance to ask questions. I think they feel comfortable with this plan and are motivated to proceed with surgery to break the cycle attacks and avoid progression to obstruction or perforation.  He is on Xerelto and has had some cardiac issues. He seems to have pretty good performance status. Would like to double check with Dr. admission to make sure that there are no concerns holding Xerelto 2 days preop or any other further cardiac workup.  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 However there is no major seizure disorder or malignancy that is making neurology concerned this time. He seems to have good mobility in exercise tolerance. Defer to Dr. Delice Lesch with [facility] neurology.  Current Plans Pt Education - [facility] Diverticular Disease (AT)  DIVERTICULAR DISEASE OF COLON (K57.30)   PREOP COLON - ENCOUNTER FOR PREOPERATIVE EXAMINATION FOR GENERAL SURGICAL PROCEDURE (Z01.818)  Current Plans You are being scheduled for surgery - Our schedulers will call you.   You should hear from our office's scheduling department within 5 working days about the location, date, and time of surgery.  We try to make accommodations for patient's preferences in scheduling surgery, but sometimes the OR schedule or the surgeon's schedule prevents [place] from making those accommodations.  If you have not heard from our office [phone]) in 5 working days, call the office and ask for your surgeon's nurse.  If you have other questions about your diagnosis, plan, or surgery, call the office and ask for your surgeon's nurse.  Written instructions provided The anatomy & physiology of the digestive tract was discussed.  The pathophysiology of the colon was discussed.  Natural history risks without surgery was discussed.   I feel the risks of no intervention will lead to serious problems that outweigh the operative risks; therefore, I recommended a partial colectomy to remove the pathology.  Minimally invasive (Robotic/Laparoscopic) & open techniques were  discussed.  Risks such as bleeding, infection, abscess, leak, reoperation, possible ostomy, hernia, heart attack, death, and other risks were discussed.  I noted a good likelihood this will help address the problem.   Goals of post-operative recovery were discussed as well.   Need for adequate nutrition, daily bowel regimen and healthy physical activity, to optimize recovery was noted as  well. We will work to minimize complications.  Educational materials were available as well.  Questions were answered.  The patient expresses understanding & wishes to proceed with surgery.  Pt Education - [facility] Colon Bowel Prep 2018 ERAS/Miralax/Antibiotics Pt Education - Pamphlet Given - Laparoscopic Colorectal Surgery: discussed with patient and provided information. Pt Education - [facility] Colectomy post-op instructions: discussed with patient and provided information. Started metroNIDAZOLE 500 MG Oral Tablet, 2 (two) Tablet three times daily, #6, 1 day starting 04/09/2021, No Refill. Local Order: Pharmacist Notes: Pharmacy Instructions: Take 2 tablets at 2pm, 3pm, and 10pm the day prior to your colon operation. Started Neomycin Sulfate 500 MG Oral Tablet, 2 (two) Tablet SEE NOTE, #6, 04/09/2021, No Refill. Local Order: Pharmacist Notes: TAKE TWO TABLETS AT 2 PM, 3 PM, AND 10 PM THE DAY PRIOR TO SURGERY  ANTICOAGULATED (Z79.01)  Current Plans I recommended obtaining preoperative cardiac clearance.  I am concerned about the health of the patient and the ability to tolerate the operation.  Therefore, we will request clearance by cardiology to better assess operative risk & see if a reevaluation, further workup, etc is needed.  Also recommendations on how medications such as for anticoagulation and blood pressure should be managed/held/restarted after surgery. Pt Education - [facility] Hold anticoagulation preoperatively  RESTLESS LEG (G25.81)   INCISIONAL HERNIA, WITHOUT OBSTRUCTION OR GANGRENE (K43.2) Impression: Epigastric incisional hernia. Not causing pain and symptoms and does not appear to change in size. I would leave this alone.   HISTORY OF UMBILICAL HERNIA REPAIR [phone]) Impression: History of prior periumbilical hernia repair with mesh. I feel some permanent sutures. I would hold off on trying to remove sutures anything for risk of hernia recurrence. He seems somewhat reassured.  Adin Hector, MD, FACS, MASCRS Esophageal, Gastrointestinal & Colorectal Surgery Robotic and Minimally Invasive Surgery  [facility] [address] [place], [place] [address] [phone] Fax [phone] Main/Paging  CONTACT INFORMATION: Weekday (9AM-5PM) concerns: Call [facility] main office at [phone] Weeknight (5PM-9AM) or Weekend/Holiday concerns: Check [email or web address] for General Surgery [facility] coverage (Please, do not use SecureChat as it is not reliable communication to operating surgeons for immediate patient care)

## 2021-04-10 ENCOUNTER — Telehealth: Payer: Self-pay | Admitting: *Deleted

## 2021-04-10 NOTE — Telephone Encounter (Signed)
   [facility] Pre-operative Risk Assessment    Patient Name: Grant [name]  DOB: [birth year]  MRN: [id number]   HEARTCARE STAFF: - Please ensure there is not already an duplicate clearance open for this procedure. - Under Visit Info/Reason for Call, type in Other and utilize the format Clearance MM/DD/YY or Clearance TBD. Do not use dashes or single digits. - If request is for dental extraction, please clarify the # of teeth to be extracted. - If the patient is currently at the dentist's office, call Pre-Op APP to address. If the patient is not currently in the dentist office, please route to the Pre-Op pool  Request for surgical clearance:  What type of surgery is being performed? ROBOTIC SIGMOID RESECTION (URGENT)   When is this surgery scheduled? TBD (URGENT)   What type of clearance is required (medical clearance vs. Pharmacy clearance to hold med vs. Both)? BOTH  Are there any medications that need to be held prior to surgery and how long? [facility]   Practice name and name of physician performing surgery? [facility]; DR. Remo Lipps GROSS   What is the office phone number? [phone]   7.   What is the office fax number? RevereIllene Regulus. CMA  8.   Anesthesia type (None, local, MAC, general) ? GENERAL   Julaine Hua 04/10/2021, 4:59 PM  _________________________________________________________________   (provider comments below)

## 2021-04-11 ENCOUNTER — Other Ambulatory Visit: Payer: Medicare Other

## 2021-04-11 NOTE — Telephone Encounter (Signed)
Patient with diagnosis of afib on Xarelto for anticoagulation.    Procedure: ROBOTIC SIGMOID RESECTION (URGENT)  Date of procedure: urgent  CHA2DS2-VASc Score = 2  This indicates a 2.2% annual risk of stroke. The patient's score is based upon: CHF History: No HTN History: Yes Diabetes History: No Stroke History: No Vascular Disease History: No Age Score: 1 Gender Score: 0     CrCl 70 ml/min Platelet count 128  Per office protocol, patient can hold Xarelto for 2-3 days prior to procedure.

## 2021-04-11 NOTE — Telephone Encounter (Signed)
Left VM with wife, who previously was involved with his clearance.

## 2021-04-12 NOTE — Telephone Encounter (Signed)
Primary Cardiologist:Peter [name], MD  Chart reviewed as part of pre-operative protocol coverage. Because of Grant [name]'s past medical history and time since last visit, he/she will require a follow-up visit in order to better assess preoperative cardiovascular risk.  Pre-op covering staff: - Please schedule appointment and call patient to inform them. - Please contact requesting surgeon's office via preferred method (i.e, phone, fax) to inform them of need for appointment prior to surgery.  If applicable, this message will also be routed to pharmacy pool and/or primary cardiologist for input on holding anticoagulant/antiplatelet agent as requested below so that this information is available at time of patient's appointment.   Deberah Pelton, NP  04/12/2021, 6:59 AM

## 2021-04-12 NOTE — Telephone Encounter (Signed)
Left message for pt that he is going to need a pre op appt. Per clearance has been stated as URGENT. Left message for pt to please call the office and either confirm or change appt if need be. I did leave message as Juluis Rainier if he needs to change the appt that the schedules are full. I did get ok per Laurann Montana, NP to use time slot for URGENT SURGERY. I have held the time slot for the pt to all back and confirm. If I do not hear back from the pt by 5 pm today. I will be sure to call the pt today before I leave.

## 2021-04-15 ENCOUNTER — Ambulatory Visit: Payer: Medicare Other | Admitting: Family

## 2021-04-15 ENCOUNTER — Other Ambulatory Visit: Payer: Self-pay

## 2021-04-15 ENCOUNTER — Encounter: Payer: Self-pay | Admitting: Family

## 2021-04-15 VITALS — BP 140/76 | HR 54 | Ht 70.0 in | Wt 226.2 lb

## 2021-04-15 DIAGNOSIS — Z01810 Encounter for preprocedural cardiovascular examination: Secondary | ICD-10-CM

## 2021-04-15 DIAGNOSIS — I480 Paroxysmal atrial fibrillation: Secondary | ICD-10-CM

## 2021-04-15 DIAGNOSIS — Z952 Presence of prosthetic heart valve: Secondary | ICD-10-CM

## 2021-04-15 DIAGNOSIS — G4733 Obstructive sleep apnea (adult) (pediatric): Secondary | ICD-10-CM

## 2021-04-15 DIAGNOSIS — Z7901 Long term (current) use of anticoagulants: Secondary | ICD-10-CM | POA: Diagnosis not present

## 2021-04-15 DIAGNOSIS — I10 Essential (primary) hypertension: Secondary | ICD-10-CM

## 2021-04-15 NOTE — Patient Instructions (Addendum)
Medication Instructions:  Continue your current medications.   *If you need a refill on your cardiac medications before your next appointment, please call your pharmacy*   Lab Work: None ordered today.   Testing/Procedures: Your physician has requested that you have an echocardiogram. Echocardiography is a painless test that uses sound waves to create images of your heart. It provides your doctor with information about the size and shape of your heart and how well your heart's chambers and valves are working. This procedure takes approximately one hour. There are no restrictions for this procedure.   Follow-Up: At [facility], you and your health needs are our priority.  As part of our continuing mission to provide you with exceptional heart care, we have created designated Provider Care Teams.  These Care Teams include your primary Cardiologist (physician) and Advanced Practice Providers (APPs -  Physician Assistants and Nurse Practitioners) who all work together to provide you with the care you need, when you need it.  Your next appointment:   As scheduled 06/04/21 with Dr. [name]  Other Instructions  Loel Dubonnet, NP will send a note to your surgeon's office that you are cleared for planned procedure. You may hold Xarelto 2-3 days prior.   Tips to Measure your Blood Pressure Correctly  Here's what you can do to ensure a correct reading:  Don't drink a caffeinated beverage or smoke during the 30 minutes before the test.  Sit quietly for five minutes before the test begins.  During the measurement, sit in a chair with your feet on the floor and your arm supported so your elbow is at about heart level.  The inflatable part of the cuff should completely cover at least 80% of your upper arm, and the cuff should be placed on bare skin, not over a shirt.  Don't talk during the measurement.  Have your blood pressure measured twice, with a brief break in between. If the readings are  different by 5 points or more, have it done a third time.  Blood pressure categories  Blood pressure category SYSTOLIC (upper number)  DIASTOLIC (lower number)  Normal Less than 120 mm Hg and Less than 80 mm Hg  Elevated 120-129 mm Hg and Less than 80 mm Hg  High blood pressure: Stage 1 hypertension 130-139 mm Hg or 80-89 mm Hg  High blood pressure: Stage 2 hypertension 140 mm Hg or higher or 90 mm Hg or higher  Hypertensive crisis (consult your doctor immediately) Higher than 180 mm Hg and/or Higher than 120 mm Hg  Source: American Heart Association and American Stroke Association. For more on getting your blood pressure under control, buy Controlling Your Blood Pressure, a Special Health Report from [facility].   Blood Pressure Log   Date   Time  Blood Pressure  Position  Example: Nov 1 9 AM 124/78 sitting                                                     Heart Healthy Diet Recommendations: A low-salt diet is recommended. Meats should be grilled, baked, or boiled. Avoid fried foods. Focus on lean protein sources like fish or chicken with vegetables and fruits. The American Heart Association is a Microbiologist!  American Heart Association Diet and Lifeystyle Recommendations   Exercise recommendations: The American Heart Association  recommends 150 minutes of moderate intensity exercise weekly. Try 30 minutes of moderate intensity exercise 4-5 times per week. This could include walking, jogging, or swimming.

## 2021-04-15 NOTE — Progress Notes (Signed)
Office Visit    Patient Name: Grant [name] Date of Encounter: 04/15/2021  PCP:  Shirline Frees, [facility]  Cardiologist:  [name], MD  Advanced Practice Provider:  No care team member to display Electrophysiologist:  None   Chief Complaint    Grant [name] is a 70 y.o. male with a hx of paroxysmal atrial fibrillation on chronic anticoagulation, hypertension, OSA on CPAP, bicuspid aortic valve with AI s/p Bentall procedure and AVR 2018, angioedema not on ACE or ARB presents today for preoperative clearance   Past Medical History    Past Medical History:  Diagnosis Date   Allergic rhinitis    Anxiety    Arthritis    Asthma    Bladder tumor    Chronic fatigue    Depression    06/30/17 Pt denies being depressed, reports Effexor is taken for Chronic Fatigue    Dyspnea    Enlarged prostate    Fibromyalgia    GERD (gastroesophageal reflux disease)    Headache    History of chronic bronchitis    History of migraine    History of toxic encephalopathy    Hypothyroidism    OSA on CPAP    CPAP 14   PAF (paroxysmal atrial fibrillation) ([place]) CARDIOLOGIST -- DR [name]   DX OCT 2013   S/P AVR (aortic valve replacement) and aortoplasty    Sensitiveness to light    Unspecified essential hypertension    Urethral tumor    PROSTATIC   Past Surgical History:  Procedure Laterality Date   BENTALL PROCEDURE N/A 07/02/2017   Procedure: BENTALL PROCEDURE;  Surgeon: Ivin Poot, MD;  Location: [place];  Service: Open Heart Surgery;  Laterality: N/A;  WITH CIRC ARREST   BIOPSY  02/06/2021   Procedure: BIOPSY;  Surgeon: Clarene Essex, MD;  Location: [facility];  Service: Endoscopy;;   CARDIAC SURGERY     COLONOSCOPY     COLONOSCOPY WITH PROPOFOL N/A 02/06/2021   Procedure: COLONOSCOPY WITH PROPOFOL;  Surgeon: Clarene Essex, MD;  Location: [facility];  Service: Endoscopy;  Laterality: N/A;   CYSTOSCOPY W/ RETROGRADES Bilateral 06/15/2013    Procedure: CYSTOSCOPY WITH BILATERAL RETROGRADE PYELOGRAM  BLADDER BIOPSY, PROSTATIC URETHRAL BIOPSY, ;  Surgeon: Molli Hazard, MD;  Location: [facility];  Service: Urology;  Laterality: Bilateral;   ESOPHAGOGASTRODUODENOSCOPY (EGD) WITH PROPOFOL N/A 03/11/2018   Procedure: ESOPHAGOGASTRODUODENOSCOPY (EGD) WITH PROPOFOL;  Surgeon: Clarene Essex, MD;  Location: [place];  Service: Endoscopy;  Laterality: N/A;   LAPAROSCOPIC CHOLECYSTECTOMY  01-14-2001   RIGHT/LEFT HEART CATH AND CORONARY ANGIOGRAPHY N/A 06/12/2017   Procedure: RIGHT/LEFT HEART CATH AND CORONARY ANGIOGRAPHY;  Surgeon: Larey Dresser, MD;  Location: [place] [facility];  Service: Cardiovascular;  Laterality: N/A;   TEE WITHOUT CARDIOVERSION N/A 07/02/2017   Procedure: TRANSESOPHAGEAL ECHOCARDIOGRAM (TEE);  Surgeon: Prescott Gum, Collier Salina, MD;  Location: [place];  Service: Open Heart Surgery;  Laterality: N/A;   UMBILICAL HERNIA REPAIR  01-14-2008    Allergies  Allergies  Allergen Reactions   Perphenazine Other (See Comments)    Tongue swelling   Amiodarone Swelling   Codeine Itching and Other (See Comments)   Other     Other reaction(s): Unknown   Tramadol Other (See Comments)    dellusion     History of Present Illness    Grant [name] is a 70 y.o. male with a hx of  paroxysmal atrial fibrillation on chronic anticoagulation, hypertension,  OSA on CPAP, bicuspid aortic valve with AI s/p Bentall procedure and AVR 2018, angioedema not on ACE or ARB last seen 09/2020 by Dr. [name].  He had recurrent PAF October 2019 which converted with IV diltiazem and was evaluated by EP Dr. [name] who recommended amiodarone and he was transitioned to Xarelto for anticoagulation.  Echo 2019 LVEF 60-65%, AVR mean gradient 16, peak gradient 33 mmHg, no aortic regurgitation.  He was evaluated in 2020 in the ER for angioedema given steroids and Benadryl was not on ACE or ARB at that time.  He does not tolerate oral steroids.   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  Presents today for follow-up with his wife. Notes no chest pain, pressure, tightness.  He has a longstanding history of fatigue and tells me this is unchanged from his baseline and he has good days and bad days. He has been busy staining the decks on his home.  Reports no edema, orthopnea, PND.  Reports no shortness of breath at rest and stable dyspnea on exertion.  He has not been monitoring his blood pressure at home as his wife tells me he will not let her check.  We discussed the importance of optimal blood pressure control.   EKGs/Labs/Other Studies Reviewed:   The following studies were reviewed today: Echo 07/26/18 Left ventricle: The cavity size was normal. Wall thickness was    increased in a pattern of moderate LVH. Systolic function was    normal. The estimated ejection fraction was in the range of 60%    to 65%. Wall motion was normal; there were no regional wall    motion abnormalities. Doppler parameters are consistent with    abnormal left ventricular relaxation (grade 1 diastolic    dysfunction).  - Aortic valve: A bioprosthesis was present.  - Right atrium: The atrium was mildly dilated.  - Pulmonary arteries: Systolic pressure was mildly increased. PA    peak pressure: 34 mm Hg (S).   -------------------------------------------------------------------  Study data:  Comparison was made to the study of 08/28/2017.  Study  status:  Routine.  Procedure:  The patient reported no pain pre or  post test. Transthoracic echocardiography. Image quality was  adequate.  Study completion:  There were no complications.  Transthoracic echocardiography.  M-mode, complete 2D, spectral  Doppler, and color Doppler.  Birthdate:  Patient birthdate:  [birth year].  Age:  Patient is 70 yr old.  Sex:  Gender: male.  BMI: 37 kg/m^2.  Blood pressure:     141/82  Patient status:  Outpatient.  Study date:  Study date:  07/26/2018. Study time: 10:34  AM.  Location:  [facility]   EKG:  EKG is ordered today.  The ekg ordered today demonstrates SB 54 bpm with no acute ST/T wave changes.   Recent Labs: 10/13/2020: Hemoglobin 14.8; Magnesium 1.8; Platelets 128 12/25/2020: ALT 34; BUN 22; Creatinine, Ser 1.26; Potassium 4.9; Sodium 136  Recent Lipid Panel    Component Value Date/Time   CHOL 185 02/17/2019 0714   CHOL 184 05/05/2017 0931   TRIG 234 (H) 02/17/2019 0714   HDL 42 02/17/2019 0714   HDL 45 05/05/2017 0931   CHOLHDL 4.4 02/17/2019 0714   VLDL 47 (H) 02/17/2019 0714   LDLCALC 96 02/17/2019 0714   LDLCALC 94 05/05/2017 0931    Home Medications   Current Meds  Medication Sig   acetaminophen (TYLENOL) 500 MG tablet Take 1,000 mg by mouth every  8 (eight) hours as needed for mild pain, fever or headache. Per bottle as needed   albuterol (VENTOLIN HFA) 108 (90 Base) MCG/ACT inhaler INHALE 2 PUFFS INTO THE LUNGS EVERY 6 HOURS AS NEEDED FOR WHEEZING OR SHORTNESS OF BREATH   ALPRAZolam (XANAX) 0.25 MG tablet Take 1 tablet (0.25 mg total) by mouth 3 (three) times daily as needed for anxiety.   aluminum hydroxide-magnesium carbonate (GAVISCON) 95-358 MG/15ML SUSP Take 15 mLs by mouth as needed for indigestion or heartburn.   amoxicillin (AMOXIL) 500 MG capsule Take 4 capsules (2,000 mg total) by mouth See admin instructions. 1 hour prior to dental procedure   azelastine (ASTELIN) 137 MCG/SPRAY nasal spray Place 1-2 sprays into the nose 2 (two) times daily as needed for rhinitis or allergies.    benzonatate (TESSALON) 200 MG capsule Take 1 capsule (200 mg total) by mouth 3 (three) times daily as needed for cough.   budesonide-formoterol (SYMBICORT) 160-4.5 MCG/ACT inhaler Inhale 2 puffs into the lungs in the morning and at bedtime.   calcium carbonate (TUMS - DOSED IN MG ELEMENTAL CALCIUM) 500 MG chewable tablet Chew 500-1,000 mg by mouth daily as needed for indigestion or heartburn.    chlorpheniramine (CHLOR-TRIMETON) 4 MG tablet Take 4 mg by mouth every 6 (six) hours as needed for allergies.   clobetasol ointment (TEMOVATE) 6.29 % Apply 1 application topically 2 (two) times daily as needed (rash).   COLCRYS 0.6 MG tablet Take 0.6 mg by mouth daily.    Dextromethorphan-guaiFENesin (MUCINEX DM MAXIMUM STRENGTH) 60-1200 MG TB12 Take 1 tablet by mouth 2 (two) times daily as needed (congestion/cough).   DHEA 50 MG TABS Take 50 mg by mouth daily with breakfast.    diphenhydrAMINE (BENADRYL) 25 MG tablet Take 25 mg by mouth daily as needed for allergies.   EPINEPHrine 0.3 mg/0.3 mL IJ SOAJ injection Inject 0.3 mLs (0.3 mg total) into the muscle as needed (for angioedema).   Erenumab-aooe (AIMOVIG) 140 MG/ML SOAJ Inject 140 mg into the skin every 28 (twenty-eight) days.   Ferrous Sulfate (IRON) 142 (45 Fe) MG TBCR Take 142 mg by mouth in the morning and at bedtime.   finasteride (PROSCAR) 5 MG tablet Take 5 mg by mouth at bedtime.    levothyroxine (SYNTHROID, LEVOTHROID) 112 MCG tablet Take 112 mcg by mouth daily before breakfast.    liothyronine (CYTOMEL) 25 MCG tablet Take 25 mcg by mouth daily before breakfast.   losartan (COZAAR) 50 MG tablet TAKE 1 TABLET BY MOUTH EVERY DAY (Patient taking differently: Take 50 mg by mouth daily.)   magnesium oxide (MAG-OX) 400 MG tablet Take 400 mg by mouth daily.   Menthol, Topical Analgesic, (BLUE-EMU MAXIMUM STRENGTH EX) Apply 1 application topically as needed (pain).   Methylcellulose, Laxative, (FIBER THERAPY) 500 MG TABS Take 500-1,000 mg by mouth daily.   metoprolol tartrate (LOPRESSOR) 50 MG tablet TAKE 1 TABLET(50 MG) BY MOUTH TWICE DAILY (Patient taking differently: Take 50 mg by mouth 2 (two) times daily.)   Multiple Vitamin (MULTIVITAMIN WITH MINERALS) TABS tablet Take 1 tablet by mouth daily.   olopatadine (PATANOL) 0.1 % ophthalmic solution Place 1 drop into both eyes 2 (two) times daily as needed for allergies.   omeprazole  (PRILOSEC) 40 MG capsule Take 1 capsule (40 mg total) by mouth 2 (two) times daily.   rivaroxaban (XARELTO) 20 MG TABS tablet Take 1 tablet (20 mg total) by mouth daily with supper.   rizatriptan (MAXALT) 10 MG tablet Take 1 tablet (10 mg  total) by mouth as needed for migraine. May repeat in 2 hours if needed   saccharomyces boulardii (FLORASTOR) 250 MG capsule Take 250 mg by mouth daily as needed (when taking antibiotics).   sodium chloride (OCEAN) 0.65 % SOLN nasal spray Place 1 spray into both nostrils as needed for congestion.   tamsulosin (FLOMAX) 0.4 MG CAPS capsule Take 0.4 mg by mouth daily after supper.   Testosterone Cypionate 200 MG/ML SOLN Inject 100 Units as directed once a week.   venlafaxine (EFFEXOR-XR) 150 MG 24 hr capsule Take 150 mg by mouth daily with breakfast.     Review of Systems    All other systems reviewed and are otherwise negative except as noted above.  Physical Exam    VS:  BP 140/76   Pulse (!) 54   Ht 5\' 10"  (1.778 m)   Wt 226 lb 3.2 oz (102.6 kg)   SpO2 100%   BMI 32.46 kg/m  , BMI Body mass index is 32.46 kg/m.  Wt Readings from Last 3 Encounters:  04/15/21 226 lb 3.2 oz (102.6 kg)  03/19/21 238 lb (108 kg)  02/26/21 236 lb (107 kg)     GEN: Well nourished, well developed, in no acute distress. HEENT: normal. Neck: Supple, no JVD, carotid bruits, or masses. Cardiac: RRR, gr2/6 murmur, no rubs, or gallops. No clubbing, cyanosis, edema.  Radials/DP/PT 2+ and equal bilaterally.  Respiratory:  Respirations regular and unlabored, clear to auscultation bilaterally. GI: Soft, nontender, nondistended. MS: No deformity or atrophy. Skin: Warm and dry, no rash. Neuro:  Strength and sensation are intact. Psych: Normal affect.  Assessment & Plan    Preoperative cardiovascular clearance - Plan for urgent robotic sigmoid resection. Date TBD. Per pharmacy team he may hold Xarelto 2-3 days prior to planned procedure. Resume at discretion of operating  surgeon. He and his wife verbalize understanding of these instructions. Reports no anginal symptoms, no known history of coronary artery disease. According to the Revised Cardiac Risk Index (RCRI), his Perioperative Risk of Major Cardiac Event is (%): 6.6. HisFunctional Capacity in METs is: 5.62 according to the [facility] Activity Status Index (DASI). He is deemed acceptable risk for the planned procedure without further cardiovascular testing. Will route to requesting party via Epic fax function.   Sinus bradycardia - EKG today SB 54 bpm. Reports no near syncope, syncope, lightheadedness. Still able to achieve >4 METS activity. Continue present dose Metoprolol 50mg  BID. If he becomes symptomatic, plan to reduce dose.   PAF on chronic anticoagulation - Maintaining SB by EKG today. NO palpitations. Denies bleeding complications on Xarelto 20mg  daily.   HTN - Elevated BP in clinic today. Hesitant regarding medication changes. Discussed BP goal <130/80. Agreeable to monitor at home and report BP consistently >130/80. If noted, plan to increase Losartan.   History of biscuspid aortic valve with oartic insufficiency and dilated aortic root s/p Bentall procedure 06/2017 - No chest pain, dyspnea, syncope. Stable 2/6 SEM on exam. Due for repeat echocardiogram for monitoring to be scheduled at his convenience. Does not need to be completed prior to his surgical intervention. Continue optimal BP control.   OSA - Continued CPAP compliance encouraged.   Disposition: Follow up as scheduled in August with Dr. [name] with echocardiogram prior.   Signed, Loel Dubonnet, NP 04/15/2021, 11:52 AM [place]

## 2021-04-17 ENCOUNTER — Other Ambulatory Visit: Payer: Self-pay | Admitting: Urology

## 2021-04-25 NOTE — Progress Notes (Unsigned)
Grant [name] (Key: [id number]) Aimovig 140MG /ML auto-injectors   Form [employer] [place] [employer] Part D Electronic Request Form (CB) Sent to plan, questions to come over.

## 2021-04-26 NOTE — Progress Notes (Unsigned)
Per [facility] requesting authorization does not require quanity limit exception review. Aimovig140mg /ml does not exceed the members formulary ql limits. Any additional questions to call at [phone].

## 2021-05-01 ENCOUNTER — Ambulatory Visit (HOSPITAL_COMMUNITY): Payer: Medicare Other | Attending: Cardiology

## 2021-05-01 ENCOUNTER — Other Ambulatory Visit: Payer: Self-pay

## 2021-05-01 DIAGNOSIS — Z952 Presence of prosthetic heart valve: Secondary | ICD-10-CM

## 2021-05-01 DIAGNOSIS — I480 Paroxysmal atrial fibrillation: Secondary | ICD-10-CM | POA: Diagnosis not present

## 2021-05-01 LAB — ECHOCARDIOGRAM COMPLETE
AR max vel: 1.27 cm2
AV Area VTI: 1.73 cm2
AV Area mean vel: 1.28 cm2
AV Mean grad: 21 mmHg
AV Peak grad: 41.2 mmHg
Ao pk vel: 3.21 m/s
Area-P 1/2: 1.89 cm2
S' Lateral: 3.5 cm

## 2021-05-02 NOTE — Telephone Encounter (Signed)
Called patient's wife with echo results. Patient's wife had no other questions.

## 2021-05-08 DIAGNOSIS — M79642 Pain in left hand: Secondary | ICD-10-CM | POA: Diagnosis not present

## 2021-05-08 DIAGNOSIS — M25511 Pain in right shoulder: Secondary | ICD-10-CM | POA: Diagnosis not present

## 2021-05-14 ENCOUNTER — Other Ambulatory Visit (HOSPITAL_COMMUNITY): Payer: Self-pay

## 2021-05-15 ENCOUNTER — Telehealth: Payer: Self-pay | Admitting: Adult Health

## 2021-05-15 ENCOUNTER — Other Ambulatory Visit: Payer: Self-pay | Admitting: Urology

## 2021-05-15 MED ORDER — AZITHROMYCIN 250 MG PO TABS: ORAL_TABLET | ORAL | 0 refills | Status: DC

## 2021-05-15 NOTE — Telephone Encounter (Signed)
Called and spoke with pt's wife Juliann Pulse who states that pt began to have a cough which first began 1 week ago which they originally thought was due to the humidity and also pt has been doing a lot of walking and she thought that he might've been overexerting himself.  Juliann Pulse stated that the cough just gradually began to get worse. Cough is sometimes a dry cough but at times cough is productive as pt is getting up clear to green phlegm.  Pt denies any complaints of wheezing and has no fever as temp today was 97.6.  Pt does have some chest congestion as well as some increased SOB.  Pt has been using his rescue inhaler at least 3-4 times daily. Pt is using his symbicort inhaler twice a day as prescribed. Pt has been taking '1200mg'$  mucinex twice a day, using tessalon 2-3 times a day as well as delsym cough syrup. Pt is also using his cpap every night.  Pt is scheduled to have colon surgery 8/5 and they want to know if there is anything that can be recommended to help him out as they want to try to knock this out prior to the surgery.  Dr. Melvyn Novas, please advise.    Assessment & Plan Note by Melvenia Needles, NP at 03/19/2021 4:42 PM  Author: Melvenia Needles, NP Author Type: Nurse Practitioner Filed: 03/19/2021  4:42 PM  Note Status: Written Cosign: Cosign Not Required Encounter Date: 03/19/2021  Problem: Acute asthmatic bronchitis  Editor: Melvenia Needles, NP (Nurse Practitioner)               Slow to resolve flare.  Increased symptom burden on lower dose of Symbicort.  Will increase back to Symbicort 162 puffs twice daily.  Oral inhaler care discussed   Plan  Patient Instructions  Increase Symbicort 160 2 puffs Twice daily  , rinse after use. Deslym 2 tsp Twice daily  As needed  Cough Tessalon Three times a day  As needed  Cough . Follow up with Dr. Melvyn Novas as planned and As needed   Please contact office for sooner follow up if symptoms do not improve or worsen or seek emergency care

## 2021-05-15 NOTE — Telephone Encounter (Signed)
Zpak/ Prednisone 10 mg take  4 each am x 2 days,   2 each am x 2 days,  1 each am x 2 days and stop  

## 2021-05-15 NOTE — Telephone Encounter (Signed)
Called and spoke with Juliann Pulse letting her know the recs per MW. Juliann Pulse stated that pt will refuse to take a prednisone pill as he cannot tolerate it due to it making him "crazy-acting" and can only tolerate DepoMedrol injections.  Stated to [place] that we did not have any openings for pt to have an appt and to receive a DepoMedrol injection so stated to her if she believed that pt will need a DepoMedrol injection for her to take him to UC and they should be able to give him one. Juliann Pulse verbalized understanding. Nothing further needed.

## 2021-05-15 NOTE — Patient Instructions (Addendum)
DUE TO COVID-19 ONLY ONE VISITOR IS ALLOWED TO COME WITH YOU AND STAY IN THE WAITING ROOM ONLY DURING PRE OP AND PROCEDURE DAY OF SURGERY. THE 2 VISITORS  MAY VISIT WITH YOU AFTER SURGERY IN YOUR PRIVATE ROOM DURING VISITING HOURS ONLY!  YOU NEED TO HAVE A COVID 19 TEST ON_8/3______ '@_[address]'$  [address] between the hours of 8-3______, THIS TEST MUST BE DONE BEFORE SURGERY,                 Grant [name]     Your procedure is scheduled on: 05/24/21   Report to [facility] Main  Entrance   Report to admitting at  9:45 AM     Call this number if you have problems the morning of surgery [phone]   Follow all instructions for the bowel prep from the Dr's office.  Drink plenty of liquid on the day of prep to prevent dehydration.  DRINK 2 PRESURGERY ENSURE DRINKS THE NIGHT BEFORE SURGERY AT 10:00 PM .  NO SOLIDS AFTER MIDNIGHT THE DAY PRIOR TO THE SURGERY.   NOTHING BY MOUTH EXCEPT CLEAR LIQUIDS UNTIL   8:30.  Drink the last ensure drink  between 8:00 and 8:30 am.   BRUSH YOUR TEETH MORNING OF SURGERY AND RINSE YOUR MOUTH OUT, NO CHEWING GUM CANDY OR MINTS.     Take these medicines the morning of surgery with A SIP OF WATER: Effexor-XR, Omeprazole, Metoprolol, Tamsulosin, Synthroid,   Liothyronine.  Hold Xarelto for 2 days prior to DOS. Last dose will be 8/2   Use your inhaler and bring it with you to the hospital.    Bring your mask and tubing to the hospital                                 You may not have any metal on your body              piercings  Do not wear jewelry, lotions, powders or deodorant                          Men may shave face and neck.   Do not bring valuables to the hospital. [place].  Contacts, dentures or bridgework may not be worn into surgery.                  Please read over the following fact sheets you were  given: _____________________________________________________________________             [facility] - Preparing for Surgery Before surgery, you can play an important role.  Because skin is not sterile, your skin needs to be as free of germs as possible.  You can reduce the number of germs on your skin by washing with CHG (chlorahexidine gluconate) soap before surgery.  CHG is an antiseptic cleaner which kills germs and bonds with the skin to continue killing germs even after washing. Please DO NOT use if you have an allergy to CHG or antibacterial soaps.  If your skin becomes reddened/irritated stop using the CHG and inform your nurse when you arrive at Short Stay.  You may shave your face/neck. Please follow these instructions carefully:  1.  Shower with CHG Soap the night before surgery and the  morning  of Surgery.  2.  If you choose to wash your hair, wash your hair first as usual with your  normal  shampoo.  3.  After you shampoo, rinse your hair and body thoroughly to remove the  shampoo.                                        4.  Use CHG as you would any other liquid soap.  You can apply chg directly  to the skin and wash                       Gently with a scrungie or clean washcloth.  5.  Apply the CHG Soap to your body ONLY FROM THE NECK DOWN.   Do not use on face/ open                           Wound or open sores. Avoid contact with eyes, ears mouth and genitals (private parts).                       Wash face,  Genitals (private parts) with your normal soap.             6.  Wash thoroughly, paying special attention to the area where your surgery  will be performed.  7.  Thoroughly rinse your body with warm water from the neck down.  8.  DO NOT shower/wash with your normal soap after using and rinsing off  the CHG Soap.             9.  Pat yourself dry with a clean towel.            10.  Wear clean pajamas.            11.  Place clean sheets on your bed the night of your first  shower and do not  sleep with pets. Day of Surgery : Do not apply any lotions/deodorants the morning of surgery.  Please wear clean clothes to the hospital/surgery center.  FAILURE TO FOLLOW THESE INSTRUCTIONS MAY RESULT IN THE CANCELLATION OF YOUR SURGERY PATIENT SIGNATURE_________________________________  NURSE SIGNATURE__________________________________  ________________________________________________________________________   Grant [name]  An incentive spirometer is a tool that can help keep your lungs clear and active. This tool measures how well you are filling your lungs with each breath. Taking long deep breaths may help reverse or decrease the chance of developing breathing (pulmonary) problems (especially infection) following: A long period of time when you are unable to move or be active. BEFORE THE PROCEDURE  If the spirometer includes an indicator to show your best effort, your nurse or respiratory therapist will set it to a desired goal. If possible, sit up straight or lean slightly forward. Try not to slouch. Hold the incentive spirometer in an upright position. INSTRUCTIONS FOR USE  Sit on the edge of your bed if possible, or sit up as far as you can in bed or on a chair. Hold the incentive spirometer in an upright position. Breathe out normally. Place the mouthpiece in your mouth and seal your lips tightly around it. Breathe in slowly and as deeply as possible, raising the piston or the ball toward the top of the column. Hold your breath for 3-5 seconds or for as long as possible. Allow  the piston or ball to fall to the bottom of the column. Remove the mouthpiece from your mouth and breathe out normally. Rest for a few seconds and repeat Steps 1 through 7 at least 10 times every 1-2 hours when you are awake. Take your time and take a few normal breaths between deep breaths. The spirometer may include an indicator to show your best effort. Use the indicator as a  goal to work toward during each repetition. After each set of 10 deep breaths, practice coughing to be sure your lungs are clear. If you have an incision (the cut made at the time of surgery), support your incision when coughing by placing a pillow or rolled up towels firmly against it. Once you are able to get out of bed, walk around indoors and cough well. You may stop using the incentive spirometer when instructed by your caregiver.  RISKS AND COMPLICATIONS Take your time so you do not get dizzy or light-headed. If you are in pain, you may need to take or ask for pain medication before doing incentive spirometry. It is harder to take a deep breath if you are having pain. AFTER USE Rest and breathe slowly and easily. It can be helpful to keep track of a log of your progress. Your caregiver can provide you with a simple table to help with this. If you are using the spirometer at home, follow these instructions: [place] IF:  You are having difficultly using the spirometer. You have trouble using the spirometer as often as instructed. Your pain medication is not giving enough relief while using the spirometer. You develop fever of 100.5 F (38.1 C) or higher. SEEK IMMEDIATE MEDICAL CARE IF:  You cough up bloody sputum that had not been present before. You develop fever of 102 F (38.9 C) or greater. You develop worsening pain at or near the incision site. MAKE SURE YOU:  Understand these instructions. Will watch your condition. Will get help right away if you are not doing well or get worse. Document Released: 02/16/2007 Document Revised: 12/29/2011 Document Reviewed: 04/19/2007 [facility] Patient Information 2014 [place], [place].   ________________________________________________________________________

## 2021-05-16 ENCOUNTER — Other Ambulatory Visit: Payer: Self-pay

## 2021-05-16 ENCOUNTER — Encounter (HOSPITAL_COMMUNITY)
Admission: RE | Admit: 2021-05-16 | Discharge: 2021-05-16 | Disposition: A | Discharge status: Home / Self Care | Payer: Medicare Other | Source: Ambulatory Visit | Attending: Surgery | Admitting: Surgery

## 2021-05-16 ENCOUNTER — Encounter (HOSPITAL_COMMUNITY): Payer: Self-pay

## 2021-05-16 DIAGNOSIS — Z01812 Encounter for preprocedural laboratory examination: Secondary | ICD-10-CM | POA: Insufficient documentation

## 2021-05-16 LAB — BASIC METABOLIC PANEL
Anion gap: 6 (ref 5–15)
BUN: 14 mg/dL (ref 8–23)
CO2: 29 mmol/L (ref 22–32)
Calcium: 9.1 mg/dL (ref 8.9–10.3)
Chloride: 104 mmol/L (ref 98–111)
Creatinine, Ser: 0.79 mg/dL (ref 0.61–1.24)
GFR, Estimated: >60 mL/min (ref >60)
Glucose, Bld: 116 mg/dL — ABNORMAL HIGH (ref 70–99)
Potassium: 4.3 mmol/L (ref 3.5–5.1)
Sodium: 139 mmol/L (ref 135–145)

## 2021-05-16 LAB — CBC
HCT: 45.7 % (ref 39.0–52.0)
Hemoglobin: 15.2 g/dL (ref 13.0–17.0)
MCH: 33 pg (ref 26.0–34.0)
MCHC: 33.3 g/dL (ref 30.0–36.0)
MCV: 99.1 fL (ref 80.0–100.0)
Platelets: 178 10*3/uL (ref 150–400)
RBC: 4.61 MIL/uL (ref 4.22–5.81)
RDW: 13 % (ref 11.5–15.5)
WBC: 10.4 10*3/uL (ref 4.0–10.5)
nRBC: 0 % (ref 0.0–0.2)

## 2021-05-16 NOTE — Progress Notes (Signed)
COVID Vaccine Completed:Yes Date COVID Vaccine completed:12/21/19-booster 07/12/20, 04/02/21 COVID vaccine manufacturer: Pfizer     PCP - Dr. Rogers Blocker LOV  in June 2022 Cardiologist - Dr. Edmonia James LOV 04/15/21 clearance 04/10/21 Pulmonologist- Dr. Irineo Axon 03/19/21 Neurologist- Dr. Ala Bent LOV 12/31/20  Chest x-ray - no EKG - 04/10/21-epic Stress Test - no ECHO - no Cardiac Cath - 06/12/17-epic. Aortic valve 07/02/2017 Pacemaker/ICD device last checked:NA  Sleep Study - yes CPAP - yes  Fasting Blood Sugar - NA Checks Blood Sugar _____ times a day  Blood Thinner Instructions:Xarelto / Dr. [name] Aspirin Instructions:Stop 2 days prior to DOS / Dr. Johney [place] Last Dose:05/21/21  Anesthesia review: Yes  Patient denies shortness of breath, fever, cough and chest pain at PAT appointment Pt is sometime SOB  with activity when he has a bad day with the chronic fatigue.  He has some cognitive and memory loss and his wife is his care giver. His gait is sometimes unsteady. 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  Patient verbalized understanding of instructions that were given to them at the PAT appointment. Patient was also instructed that they will need to review over the PAT instructions again at home before surgery. yes

## 2021-05-16 NOTE — Consult Note (Signed)
[place] Nurse requested for preoperative stoma site marking  Discussed surgical procedure and stoma creation with patient and wife.  Explained role of the [place] nurse team.  Provided the patient with educational booklet and provided samples of pouching options.  Answered patient and family questions.   Examined patient  sitting, and standing in order to place the marking in the patient's visual field, away from any creases or abdominal contour issues and within the rectus muscle.  Patient with protuberant abdomen.  Marked for colostomy in the LUQ  ___6_ cm to the left of the umbilicus and [id number] above the umbilicus.  Marked for ileostomy in the RUQ  __5__cm to the right of the umbilicus and  [id number] cm above the umbilicus.  Patient's abdomen cleansed with CHG wipes at site markings, allowed to air dry prior to marking.Covered mark with thin film transparent dressing to preserve mark until date of surgery.   [facility] Nurse team will follow up with patient after surgery for continue ostomy care and teaching.  [place] MSN, [place], [place], [place]

## 2021-05-17 LAB — HEMOGLOBIN A1C
Hgb A1c MFr Bld: 5 % (ref 4.8–5.6)
Mean Plasma Glucose: 97 mg/dL

## 2021-05-20 ENCOUNTER — Telehealth: Payer: Self-pay | Admitting: Internal Medicine

## 2021-05-20 NOTE — Telephone Encounter (Signed)
If we still have depo in the pods ok to add him on one of my held slots this week prior to Friday the 5th

## 2021-05-20 NOTE — Telephone Encounter (Signed)
Called and spoke with Juliann Pulse. She stated that tomorrow would probably the best day for him. He has been scheduled for 05/21/21 at 230pm. She verbalized understanding.   Nothing further needed at time of call.

## 2021-05-20 NOTE — Telephone Encounter (Signed)
When I spoke with pt's spouse Juliann Pulse on 7/27 after she said that pt would refuse to do the oral prednisone due to how it usually makes him feel, since we did not have any openings, I suggested her take him to UC to see if they could give pt a depo injection.   Called and spoke with Juliann Pulse to see if she took pt to UC to see if they could do a depo injection and she said that she called around to mult UCs around them to see if they could give pt a depo injection and she said that they did not carry depo injections.  Juliann Pulse is wanting to know if there is any way pt can come to our office to get a depo injection.  Dr. Melvyn Novas, please advise what you recommend since we do not have any of our staff in the injection room anymore nor do we have any openings this week. Juliann Pulse is hoping that this can be taken care of prior to Wednesday as that is when pt has the surgical prep for his upcoming surgery Friday.

## 2021-05-20 NOTE — Anesthesia Preprocedure Evaluation (Addendum)
Anesthesia Evaluation  Patient identified by MRN, date of birth, ID band Patient awake    Reviewed: Allergy & Precautions, NPO status , Patient's Chart, lab work & pertinent test results  Airway Mallampati: III  TM Distance: >3 FB Neck ROM: Full    Dental  (+) Teeth Intact   Pulmonary shortness of breath (albuterol inhaler ), asthma , sleep apnea and Continuous Positive Airway Pressure Ventilation , former smoker,  COVID + 05/22/21   Pulmonary exam normal        Cardiovascular hypertension, Pt. on medications and Pt. on home beta blockers + dysrhythmias (on Xarelto ) Atrial Fibrillation  Rhythm:Irregular Rate:Normal + Systolic murmurs S/p Bentall 2018 for bicuspid AV    Neuro/Psych  Headaches, Anxiety Depression    GI/Hepatic Neg liver ROS, GERD  Medicated,Colon stricture    Endo/Other  Hypothyroidism Morbid obesity  Renal/GU negative Renal ROS  negative genitourinary   Musculoskeletal  (+) Arthritis , Osteoarthritis,  Fibromyalgia -  Abdominal (+) + obese,  Abdomen: soft.    Peds  Hematology  (+) anemia ,   Anesthesia Other Findings   Reproductive/Obstetrics                            Anesthesia Physical Anesthesia Plan  ASA: 3  Anesthesia Plan: General   Post-op Pain Management:    Induction: Intravenous  PONV Risk Score and Plan: 2 and Ondansetron, Dexamethasone and Treatment may vary due to age or medical condition  Airway Management Planned: Mask and Oral ETT  Additional Equipment: None  Intra-op Plan:   Post-operative Plan: Extubation in OR  Informed Consent: I have reviewed the patients History and Physical, chart, labs and discussed the procedure including the risks, benefits and alternatives for the proposed anesthesia with the patient or authorized representative who has indicated his/her understanding and acceptance.     Dental advisory given  Plan Discussed  with: CRNA  Anesthesia Plan Comments: (See PAT note 05/16/2021, Konrad Felix, PA-C. Surgery postponed to 06/12/21 as pt covid + Lab Results      Component                Value               Date                      WBC                      10.4                05/16/2021                HGB                      15.2                05/16/2021                HCT                      45.7                05/16/2021                MCV                      99.1  05/16/2021                PLT                      178                 05/16/2021            Lab Results      Component                Value               Date                      NA                       139                 05/16/2021                K                        4.3                 05/16/2021                CO2                      29                  05/16/2021                GLUCOSE                  116 (H)             05/16/2021                BUN                      14                  05/16/2021                CREATININE               0.79                05/16/2021                CALCIUM                  9.1                 05/16/2021                GFRNONAA                 >60                 05/16/2021                GFRAA                    >60                 02/16/2019            ECHO 07/22: 1.  Left ventricular ejection fraction, by estimation, is 50 to 55%. The  left ventricle has low normal function. There is hypokinesis of the basal  septal segments There is moderate left ventricular hypertrophy of the LV  septum. The rest of the LV  segments demonstrate mild left ventricular hypertrophy. Left ventricular  diastolic parameters are consistent with Grade I diastolic dysfunction  (impaired relaxation).  2. Right ventricular systolic function is normal. The right ventricular  size is normal. There is normal pulmonary artery systolic pressure. The  estimated right ventricular systolic pressure  is [id number] mmHg.  3. The mitral valve is normal in structure. Trivial mitral valve  regurgitation. No evidence of mitral stenosis.  4. The aortic valve has been repaired/replaced. A 82m Edwards  pericardial Magna Ease is present in the aortic position. There are mildly  elevated gradients across the aortic valve with mean gradient 249mg, peak  gradient 4137m, DI 0.26. Aortic valve  regurgitation is trivial.  5. 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 Patient is s/p  Bentall procedure. Aortic root is normal in size. Ascending aorta is  mildly dilated measuring 30m65m Comparison(s): Compared to prior TTE in 2021, the mean gradient has  increased to 21mm56mrom 16mmH74m      Anesthesia Quick Evaluation

## 2021-05-20 NOTE — Progress Notes (Addendum)
Anesthesia Chart Review   Case: [id number] Date/Time: 05/24/21 1115   Procedures:      ROBOTIC RESECTION OF COLON SIGMOID     RIGID PROCTOSCOPY     CYSTOSCOPY with FIREFLY INJECTION   Anesthesia type: General   Pre-op diagnosis: STRICTURE OF COLON   Location: [facility]OM 02 / [facility]   Surgeons: [name], MD; Alexis Frock, MD       DISCUSSION:70 y.o. former smoker with h/o HTN, PAF, GERD, OSA on CPAP, bicuspid aortic valve with AI s/p Bentall procedure and AVR 2018, stricture of colon scheduled for above procedure 05/24/2021 with Dr. [name] and Dr. Alexis Frock.   Pt seen by cardiology 04/15/2021 for preoperative evaluation.  Per OV note, "Plan for urgent robotic sigmoid resection. Date TBD. Per pharmacy team he may hold Xarelto 2-3 days prior to planned procedure. Resume at discretion of operating surgeon. He and his wife verbalize understanding of these instructions. Reports no anginal symptoms, no known history of coronary artery disease. According to the Revised Cardiac Risk Index (RCRI), his Perioperative Risk of Major Cardiac Event is (%): 6.6. HisFunctional Capacity in METs is: 5.62 according to the Duke Activity Status Index (DASI). He is deemed acceptable risk for the planned procedure without further cardiovascular testing. Will route to requesting party via Epic fax function."  Pt reports last dose of Xarelto 05/21/2021.   Pt seen by pulmonologist 05/21/2021 for increased cough and throat clearing that started 7-10 days ago. 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 Per OV note pt given a depomedrol shot.  Per Dr. Melvyn Novas, "Cleared for abd surgery as planned."  Per preoperative COVID testing site, pt tested positive for COVID 05/21/21.   Spoke with [facility], pt rescheduled to 06/12/21.  VS: BP 132/72   Pulse 63   Temp 36.8 C (Oral)   Resp 18   Ht '5\' 10"'$  (1.778 m)   Wt 98.9 kg   SpO2 99%   BMI 31.28 kg/m   PROVIDERS: Shirline Frees, MD is  PCP  [name], MD is Cardiologist   Christinia Gully, MD is Pulmonologist  LABS: Labs reviewed: Acceptable for surgery. (all labs ordered are listed, but only abnormal results are displayed)  Labs Reviewed  BASIC METABOLIC PANEL - Abnormal; Notable for the following components:      Result Value   Glucose, Bld 116 (*)    All other components within normal limits  HEMOGLOBIN A1C  CBC  TYPE AND SCREEN     IMAGES: CT Angio Chest 10/10/20 IMPRESSION: 1. There is no evidence for thoracic or abdominal aortic dissection or aneurysm. 2. Diffuse sigmoid diverticulosis with wall thickening of the sigmoid colon with adjacent mild inflammatory changes suggestive of acute uncomplicated diverticulitis. As an underlying mass cannot be excluded, follow-up with an outpatient colonoscopy is recommended. 3. Borderline splenomegaly. 4. Enlarged prostate gland.  EKG: 04/15/2021 Rate 54 bpm  No acute ST/T wave change   CV: Echo 05/01/2021  1. Left ventricular ejection fraction, by estimation, is 50 to 55%. The  left ventricle has low normal function. There is hypokinesis of the basal  septal segments There is moderate left ventricular hypertrophy of the LV  septum. The rest of the LV  segments demonstrate mild left ventricular hypertrophy. Left ventricular  diastolic parameters are consistent with Grade I diastolic dysfunction  (impaired relaxation).   2. Right ventricular systolic function is normal. The right ventricular  size is normal. There is normal pulmonary artery systolic pressure. The  estimated right ventricular systolic pressure is [id number] mmHg.   3. The mitral valve is normal in structure. Trivial mitral valve  regurgitation. No evidence of mitral stenosis.   4. The aortic valve has been repaired/replaced. A 70m Edwards  pericardial Magna Ease is present in the aortic position. There are mildly  elevated gradients across the aortic valve with mean gradient 222mg, peak   gradient 4195m, DI 0.26. Aortic valve  regurgitation is trivial.   5. 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 Patient is s/p  Bentall procedure. Aortic root is normal in size. Ascending aorta is  mildly dilated measuring 22m37m Cardiac Cath 06/12/2017 . Normal filling pressures, no pulmonary hypertension. 2. Preserved cardiac output. 3. 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  No significant coronary disease.  Stress Test 08/31/2015 Nuclear stress EF: 51%. The left ventricular ejection fraction is mildly decreased (45-54%). There was no ST segment deviation noted during stress. No T wave inversion was noted during stress. The study is normal. This is a low risk study.   Low risk stress nuclear study with normal perfusion and mildly reduced left ventricular global systolic function. Suspect nonischemic cardiomyopathy. Past Medical History:  Diagnosis Date   Allergic rhinitis    Anxiety    Arthritis    Asthma    Bladder tumor    Chronic fatigue    Depression    06/30/17 Pt denies being depressed, reports Effexor is taken for Chronic Fatigue    Dyspnea    Enlarged prostate    Fibromyalgia    GERD (gastroesophageal reflux disease)    Headache    History of chronic bronchitis    History of migraine    History of toxic encephalopathy    Hypothyroidism    OSA on CPAP    CPAP 14   PAF (paroxysmal atrial fibrillation) (HCC)[facility] -- DR NISHJohnsie CancelX OCT 2013   S/P AVR (aortic valve replacement) and aortoplasty    Sensitiveness to light    Unspecified essential hypertension    Urethral tumor    PROSTATIC    Past Surgical History:  Procedure Laterality Date   BENTALL PROCEDURE N/A 07/02/2017   Procedure: BENTALL PROCEDURE;  Surgeon: Van Ivin Poot;  [facility];  Laterality: N/A;  WITH CIRC ARREST   BIOPSY  02/06/2021   Procedure: BIOPSY;  Surgeon: MagoClarene Essex;   [facility];  Service: Endoscopy;;   COLONOSCOPY     COLONOSCOPY WITH PROPOFOL N/A 02/06/2021   Procedure: COLONOSCOPY WITH PROPOFOL;  Surgeon: MagoClarene Essex;  [facility];  Service: Endoscopy;  Laterality: N/A;   CYSTOSCOPY W/ RETROGRADES Bilateral 06/15/2013   Procedure: CYSTOSCOPY WITH BILATERAL RETROGRADE PYELOGRAM  BLADDER BIOPSY, PROSTATIC URETHRAL BIOPSY, ;  Surgeon: DaniMolli Hazard;  [facility];  Laterality: Bilateral;   ESOPHAGOGASTRODUODENOSCOPY (EGD) WITH PROPOFOL N/A 03/11/2018   Procedure: ESOPHAGOGASTRODUODENOSCOPY (EGD) WITH PROPOFOL;  Surgeon: MagoClarene Essex;  [facility];  Laterality: N/A;   LAPAROSCOPIC CHOLECYSTECTOMY  01/14/2001   RIGHT/LEFT HEART CATH AND CORONARY ANGIOGRAPHY N/A 06/12/2017   Procedure: RIGHT/LEFT HEART CATH AND CORONARY ANGIOGRAPHY;  Surgeon: McLeLarey Dresser;  [facility];  Service: Cardiovascular;  Laterality: N/A;   TEE WITHOUT CARDIOVERSION N/A 07/02/2017   Procedure: TRANSESOPHAGEAL ECHOCARDIOGRAM (TEE);  Surgeon: Van Prescott GumteCollier Salina;  [facility];  Laterality: N/A;   UMBILICAL HERNIA REPAIR  01/14/2008    MEDICATIONS:  albuterol (VENTOLIN HFA) 108 (90 Base) MCG/ACT inhaler   ALPRAZolam (XANAX) 0.25 MG tablet   aluminum hydroxide-magnesium carbonate (GAVISCON) 95-358 MG/15ML SUSP   amoxicillin (AMOXIL) 500 MG capsule   azelastine (ASTELIN) 137 MCG/SPRAY nasal spray   azithromycin (ZITHROMAX) 250 MG tablet   benzonatate (TESSALON) 200 MG capsule   budesonide-formoterol (SYMBICORT) 160-4.5 MCG/ACT inhaler   calcium carbonate (TUMS - DOSED IN MG ELEMENTAL CALCIUM) 500 MG chewable tablet   chlorpheniramine (CHLOR-TRIMETON) 4 MG tablet   clobetasol ointment (TEMOVATE) 0.05 %   COLCRYS 0.6 MG tablet   Dextromethorphan-guaiFENesin (MUCINEX DM MAXIMUM STRENGTH) 60-1200 MG TB12    diclofenac Sodium (VOLTAREN) 1 % GEL   diphenhydrAMINE (BENADRYL) 25 MG tablet   EPINEPHrine 0.3 mg/0.3 mL IJ SOAJ injection   Erenumab-aooe (AIMOVIG) 140 MG/ML SOAJ   Ferrous Sulfate (IRON) 142 (45 Fe) MG TBCR   finasteride (PROSCAR) 5 MG tablet   levothyroxine (SYNTHROID, LEVOTHROID) 112 MCG tablet   liothyronine (CYTOMEL) 25 MCG tablet   losartan (COZAAR) 50 MG tablet   magnesium oxide (MAG-OX) 400 MG tablet   Menthol, Topical Analgesic, (BLUE-EMU MAXIMUM STRENGTH EX)   Methylcellulose, Laxative, (FIBER THERAPY) 500 MG TABS   metoprolol tartrate (LOPRESSOR) 50 MG tablet   Multiple Vitamin (MULTIVITAMIN WITH MINERALS) TABS tablet   NON FORMULARY   olopatadine (PATANOL) 0.1 % ophthalmic solution   omeprazole (PRILOSEC) 40 MG capsule   rivaroxaban (XARELTO) 20 MG TABS tablet   rizatriptan (MAXALT) 10 MG tablet   saccharomyces boulardii (FLORASTOR) 250 MG capsule   sodium chloride (OCEAN) 0.65 % SOLN nasal spray   tamsulosin (FLOMAX) 0.4 MG CAPS capsule   Testosterone Cypionate 200 MG/ML SOLN   venlafaxine (EFFEXOR-XR) 150 MG 24 hr capsule   No current facility-administered medications for this encounter.    Konrad Felix, PA-C [facility] [phone]

## 2021-05-21 ENCOUNTER — Ambulatory Visit: Payer: Medicare Other | Admitting: Internal Medicine

## 2021-05-21 ENCOUNTER — Other Ambulatory Visit: Payer: Self-pay

## 2021-05-21 ENCOUNTER — Encounter: Payer: Self-pay | Admitting: Internal Medicine

## 2021-05-21 ENCOUNTER — Ambulatory Visit (INDEPENDENT_AMBULATORY_CARE_PROVIDER_SITE_OTHER): Payer: Medicare Other

## 2021-05-21 VITALS — BP 130/78 | HR 74 | Ht 70.0 in | Wt 224.0 lb

## 2021-05-21 DIAGNOSIS — G4733 Obstructive sleep apnea (adult) (pediatric): Secondary | ICD-10-CM | POA: Diagnosis not present

## 2021-05-21 DIAGNOSIS — J45909 Unspecified asthma, uncomplicated: Secondary | ICD-10-CM

## 2021-05-21 DIAGNOSIS — J45991 Cough variant asthma: Secondary | ICD-10-CM

## 2021-05-21 DIAGNOSIS — R059 Cough, unspecified: Secondary | ICD-10-CM | POA: Diagnosis not present

## 2021-05-21 DIAGNOSIS — I517 Cardiomegaly: Secondary | ICD-10-CM | POA: Diagnosis not present

## 2021-05-21 MED ORDER — METHYLPREDNISOLONE ACETATE 80 MG/ML IJ SUSP
120.0000 mg | Freq: Once | INTRAMUSCULAR | Status: AC
  Administered 2021-05-21: 120 mg via INTRAMUSCULAR

## 2021-05-21 NOTE — Progress Notes (Signed)
Grant [name], male    DOB: 11-Jan-1951      MRN: [id number]    Brief patient profile:  70 yowm quit smoking 1981 with no cough or sob at that point and then developed prolonged cough in winter while working in ? [name] basement" at [facility] around 2005 and q winter since then same pattern but became more of a chronic year round pattern around 2015  Some  worse esp in winter usually better in summer mostly dry but more productive since summer of 2019 variably purulent.    Patient timeline listed below: 06/10/2018-[facility]-cough and congestion 06/25/2018-flu shot and Prevnar 07/22/2018- returned from beach with bad cough 07/26/2018- echo with Grant. Darcey [name] 08/11/2018-[facility]-cough with Depo shot 08/20/2018-[facility] 08/24/2018-[facility] cough and Depo shot 09/22/18 -[facility]-Z-Pak and Depo-Medrol injection 12/30/2019[name]- Depo-Medrol and Levaquin      11/15/2018   1st office eval/Grant [name] re cough x acute on chronic worse since late summer 2019  And turned brown just x few weeks  Chief Complaint  Patient presents with   Pulmonary Consult    Former patient of Grant [name]. Pt c/o cough since Sept 2019.  Cough is prod with brown sputum. He coughs the most in the morning when he wakes up. He occ wakes up in the night coughing.   assoc with sensation pnds/ best rx is depomedrol 120 mg IM  Advair rx now worse with advair and prev worse with symb 160 but hfa poor baseline zpak now Med list not accurate No increase chronic doe x  MMRC3 = can't walk 100 yards even at a slow pace at a flat grade s stopping due to sob     [name] Reflux v Neurogenic Cough Differentiator Reflux Comments  Do you awaken from a sound sleep coughing violently?                            With trouble breathing? Some nights Unless cough med    Do you have choking episodes when you cannot  Get enough air, gasping for air ?               Yes   Do you usually cough when you lie down into  The bed, or when you just lie down to rest ?                          < 45 degrees start coughing   Do you usually cough after meals or eating?         Big meal   Do you cough when (or after) you bend over?    No    GERD SCORE     [name] Reflux v Neurogenic Cough Differentiator Neurogenic   Do you more-or-less cough all day long? All day   Does change of temperature make you cough? No    Does laughing or chuckling cause you to cough? sometimes   Do fumes (perfume, automobile fumes, burned  Toast, etc.,) cause you to cough ?      no   Does speaking, singing, or talking on the phone cause you to cough   ?               Talking    Neurogenic/Airway score        No obvious other patterns in day to day  or daytime variability or assoc   mucus plugs or hemoptysis or cp or chest tightness, subjective wheeze or overt   hb symptoms ( on ppi bid but not ac)     rec If the mucus doesn't turn clear >>> omnicef 300 mg twice daily x 10 days For cough / congestion >  mucinex dm 1200 mg every 12 hours supplement with tessilon and no delsym  Depomedrol 120 mg IM  Symbicort 80 Take 2 puffs first thing in am and then another 2 puffs about 12 hours later.  Only use your levoalbuterol as a rescue medication  Please schedule a follow up office visit in 2 weeks, call sooner if needed with all medications /inhalers/ solutions in hand    11/29/2018  f/u ov/Grant [name] re: chronic throat clearing x decades/  cough worse since summer 2019 / confused with meds  Chief Complaint  Patient presents with   Follow-up    Cough has improved slightly. He uses his xopenex and ventolin every few days.   Dyspnea: denies now but very sedentary  Cough: min improved  Sleeping: no bed blocks  SABA use: using maint alb and xopenex  02: none / cpap does  fine per Grant [name]  rec Delsym two tsp twice daily automatically  Symbicort 160 Take 2 puffs first thing in am and then  another 2 puffs about 12 hours later.  Only use your albuterol and levoalbuterol as a rescue medication Omeprazole 40 mg Take 30- 60 min before your first and last meals of the day  GERD diet  See [name] w/in 2 weeks with all your medications,   - late: add add gabapentin 100 tid if still throat cleairng on delsym 2 tsp bid and tessalon prn  Improved by 12/16/18 and given medication calendar  NP eval and recs  12/20/18 acutely worse cough  Mucinex Twice daily  As needed  Cough  Deslym 2 tsp Twice daily  As needed  Cough Tessalon Three times a day  As needed  Cough .  Try Chlortabs '4mg'$  Twice daily  As needed  Drainage , throat clearing , drippy nose.  Steroid injection today .   06/05/19 another flare x 10 days > zpak / depomedrol    07/07/2019  f/u ov/Grant [name] re: daily cough x 2015 best rx p ov 11/29/18 but note worse again by 12/20/18 and really never stopped throat clearing.  Chief Complaint  Patient presents with   Acute office visit     Cough has been worse over the past few wks and is occ prod with brown sputum. He is using his albuterol inhaler 1-2 x per day on average.   Dyspnea:  Doe x 50 ft  Cough: w/in a few hours of getting out of bed starts up coughing with min mucus production slt brown and assoc with his "usual" sense of pnds but nothing purulent from nose   Sleeping: bed blocks and cpap tends to sleep fine  SABA use: up to twice daily at most plus symb 160 2bid maint and hfa 75% effective technique  02: none  rec Augmentin 875 mg take one pill twice daily  X 10 days   Depomedrol 120 mg  IM today  GERD diet For cough >  mucinex dm 1200 mg every 12 hours as needed and if still coughing add the tessalon to it. See calendar for specific medication    09/11/2020  f/u ov/Grant [name] re: cough 2005  symbicort 160  2 q 12 h /  covid vaccine x 3 doses Chief Complaint  Patient presents with   Follow-up    Cough is about the same since his last visit. Breathing is unchanged. He is using  his albuterol inhaler 5-6 x per wk on average.   Dyspnea:  MMRC2 = can't walk a nl pace on a flat grade s sob but does fine slow and flat  Cough sporadic but daytime min mucoid  Sleeping: on cpap, cough is less and a few hours after taking it off and stirring starts up again  SABA use: as above  02:none  rec For cough >  mucinex dm 1200 mg every 12 hours as needed and if still coughing add the tessalon to it. For drainage  > chlorpheniramine up to every 4 hours as needed  Depomedrol 120 mg IM today  Sinus CT will be scheduled and call the results  Please schedule a follow up office visit in 6 weeks, call sooner if needed with all medications /inhalers/ solutions   Admit date: 10/10/2020 Discharge date: 10/13/2020  Brief/Interim Summary: 70 y.o. male with medical history significant of recurrent diverticulitis, asthma, depression, status post aortic valve replacement in 2018, fibromyalgia, GERD, chronic migraines, obstructive sleep apnea, hypothyroidism and paroxysmal atrial fibrillation who presented to the ER with abdominal pain with nausea and an episode of vomiting.  On presentation, he had a temperature of 100.1, tachycardic, respiratory rate of 20 with white count of 16.2 and lactic acid of 2.4.  CT of the abdomen and pelvis showed sigmoid diverticulitis.  He was admitted for sepsis secondary to sigmoid diverticulitis and started on IV fluids and antibiotics. During the hospitalization, his condition has improved. He is tolerating diet with improving abdominal pain. He is hemodynamically stable with no increased leukocytosis. He will be discharged home on oral antibiotics.   Discharge Diagnoses:  Sepsis: Present on admission Acute sigmoid diverticulitis Leukocytosis -Patient presented with a temperature of 100.1, tachycardic, respiratory rate of 20 with white count of 16.2, lactic acid of 2.4 most likely secondary to sigmoid diverticulitis as evident on CT of the abdomen and  pelvis. -Afebrile over the last 24 hours. Leukocytosis has resolved.  CRP improving to 5.6 today. -Currently on Rocephin and Flagyl.   - He is tolerating diet with improving abdominal pain. He is hemodynamically stable -Discharge home today on oral ciprofloxacin and Flagyl for 10 days. -will need outpatient follow-up with GI in 6 to 8 weeks time -Sepsis has resolved   Essential hypertension -Blood pressure stable.  Continue home regimen.   Hypothyroidism -Continue levothyroxine and liothyronine   Paroxysmal atrial fibrillation--rate mostly controlled.  Continue Xarelto   GERD -Continue Protonix   OSA -Continue CPAP at night   Morbid obesity -Outpatient follow-up   ?  Complicated migraine history versus panic attack versus?  Seizure -Patient has history of abnormal body movements specially in stressful condition and has followed up with neurology in the past who thinks that patient has complicated migraine.  Patient needed to have IV Ativan during the hospitalization and wife thought that he was panicking and improved with Ativan.  Will need outpatient neurology follow-up.  Will not start any seizure medications for now.  Continue as needed triptan -will send home on few days of oral Xanax as needed      11/20/2020  f/u ov/Kayin Kettering re:  Cough x 2005  Chief Complaint  Patient presents with   Follow-up    Patient states that he is doing much better since last visit. No concerns at  this time.   Dyspnea: was doing 2 miles  Baseline - now a block or two  Cough: not bad / some noct x 2 / week Sleeping: on bed blocks / cpap most nights sleeping better  SABA use: rarely using  02: none  rec No change medications at this point  See calendar for specific medication instructions    02/26/2021  f/u ov/Jolanta Cabeza re:  OSA/ cough x 2005 / considering abd surgery - trying to lose wt/ has med calendar but not being updated  Chief Complaint  Patient presents with   Follow-up    Having trouble with  allergies, lasted about a week. Feeling better now. Symbicort working well. Using albuterol about 1x a day if needed.    Dyspnea:  Walking the neighborhood up 2 miles including [place]  Cough: was fine until the pollen / had sense of throat swelling/ better p depomedrol  Sleeping: on bed blocks/ cpap 100% on nights 10 h /night with AHI  1.7 on 16 cm h20 SABA use: none during the day  02: none  Covid status:  vax  X  3  Rec Try reducing symbicort to 80 Take 2 puffs first thing in am and then another 2 puffs about 12 hours later.  Depomedrol 120 mg IM today  Only use your albuterol as a rescue medication For drainage / throat tickle try take CHLORPHENIRAMINE  4 mg  (Chlortab '4mg'$   at [employer] should be easiest to find in the green box)  take one every 4 hours as needed - available over the counter- may cause drowsiness so start with just a dose or two an hour before bedtime and see how you tolerate it before trying in daytime  Up to 2 every 4 hours       05/21/2021  acute ov/Eiza Canniff re: cough variant asthma with uacs  Chief Complaint  Patient presents with   Follow-up    Acute visit. Depo injection. Coughing sputum 7-10 days    Dyspnea:  walking neighborhood up to 2 miles daily  Cough: flared 7-10 days rx zpak helped some / can't take prednisone > mostly clear mucus now  Sleeping: bed blocks/ 2 pillows / no longer using cpap doing apparently just as well off it  SABA use: 3-4 x per  day since onset of worse cough   02: none  Covid status:   vax x 4    No obvious patterns in day to day or daytime variability or assoc excess/ purulent sputum or mucus plugs or hemoptysis or cp or chest tightness, subjective wheeze or overt sinus or hb symptoms.   Sleeping  without nocturnal  or early am exacerbation  of respiratory  c/o's or need for noct saba. Also denies any obvious fluctuation of symptoms with weather or environmental changes or other aggravating or alleviating factors except as  outlined above   No unusual exposure hx or h/o childhood pna/ asthma or knowledge of premature birth.  Current Allergies, Complete Past Medical History, Past Surgical History, Family History, and Social History were reviewed in [employer] record.  ROS  The following are not active complaints unless bolded Hoarseness, sore throat, dysphagia, dental problems, itching, sneezing,  nasal congestion or discharge of excess mucus or purulent secretions, ear ache,   fever, chills, sweats, unintended wt loss or wt gain, classically pleuritic or exertional cp,  orthopnea pnd or arm/hand swelling  or leg swelling, presyncope, palpitations, abdominal pain, anorexia, nausea, vomiting, diarrhea  or  change in bowel habits or change in bladder habits, change in stools or change in urine, dysuria, hematuria,  rash, arthralgias, visual complaints, headache, numbness, weakness or ataxia or problems with walking or coordination,  change in mood or  memory.        Current Meds  Medication Sig   albuterol (VENTOLIN HFA) 108 (90 Base) MCG/ACT inhaler INHALE 2 PUFFS INTO THE LUNGS EVERY 6 HOURS AS NEEDED FOR WHEEZING OR SHORTNESS OF BREATH (Patient taking differently: Inhale 2 puffs into the lungs every 6 (six) hours as needed for shortness of breath.)   ALPRAZolam (XANAX) 0.25 MG tablet Take 1 tablet (0.25 mg total) by mouth 3 (three) times daily as needed for anxiety.   aluminum hydroxide-magnesium carbonate (GAVISCON) 95-358 MG/15ML SUSP Take 15 mLs by mouth as needed for indigestion or heartburn.   amoxicillin (AMOXIL) 500 MG capsule Take 4 capsules (2,000 mg total) by mouth See admin instructions. 1 hour prior to dental procedure   azelastine (ASTELIN) 137 MCG/SPRAY nasal spray Place 1-2 sprays into the nose 2 (two) times daily as needed for rhinitis or allergies.    benzonatate (TESSALON) 200 MG capsule Take 1 capsule (200 mg total) by mouth 3 (three) times daily as needed for cough.    budesonide-formoterol (SYMBICORT) 160-4.5 MCG/ACT inhaler Inhale 2 puffs into the lungs in the morning and at bedtime.   calcium carbonate (TUMS - DOSED IN MG ELEMENTAL CALCIUM) 500 MG chewable tablet Chew 500-1,000 mg by mouth daily as needed for indigestion or heartburn.   chlorpheniramine (CHLOR-TRIMETON) 4 MG tablet Take 4 mg by mouth every 6 (six) hours as needed for allergies.   clobetasol ointment (TEMOVATE) [id number] % Apply 1 application topically 2 (two) times daily as needed (rash).   COLCRYS 0.6 MG tablet Take 0.6 mg by mouth in the morning.   Dextromethorphan-guaiFENesin (MUCINEX DM MAXIMUM STRENGTH) 60-1200 MG TB12 Take 1 tablet by mouth 2 (two) times daily as needed (congestion/cough).   diclofenac Sodium (VOLTAREN) 1 % GEL Apply 2 g topically daily as needed (pain).   diphenhydrAMINE (BENADRYL) 25 MG tablet Take 50 mg by mouth daily as needed for allergies.   EPINEPHrine 0.3 mg/0.3 mL IJ SOAJ injection Inject 0.3 mLs (0.3 mg total) into the muscle as needed (for angioedema).   Erenumab-aooe (AIMOVIG) 140 MG/ML SOAJ Inject 140 mg into the skin every 28 (twenty-eight) days.   Ferrous Sulfate (IRON) 142 (45 Fe) MG TBCR Take 45 mg by mouth in the morning and at bedtime.   finasteride (PROSCAR) 5 MG tablet Take 5 mg by mouth at bedtime.    levothyroxine (SYNTHROID, LEVOTHROID) 112 MCG tablet Take 112 mcg by mouth daily before breakfast.    liothyronine (CYTOMEL) 25 MCG tablet Take 25 mcg by mouth daily before breakfast.   losartan (COZAAR) 50 MG tablet TAKE 1 TABLET BY MOUTH EVERY DAY (Patient taking differently: Take 50 mg by mouth daily.)   magnesium oxide (MAG-OX) 400 MG tablet Take 400 mg by mouth daily.   Menthol, Topical Analgesic, (BLUE-EMU MAXIMUM STRENGTH EX) Apply 1 application topically as needed (pain).   Methylcellulose, Laxative, (FIBER THERAPY) 500 MG TABS Take 500-1,000 mg by mouth daily.   metoprolol tartrate (LOPRESSOR) 50 MG tablet TAKE 1 TABLET(50 MG) BY MOUTH TWICE DAILY  (Patient taking differently: Take 50 mg by mouth 2 (two) times daily.)   Multiple Vitamin (MULTIVITAMIN WITH MINERALS) TABS tablet Take 1 tablet by mouth daily.   NON FORMULARY Pt uses a cpap nightly   olopatadine (PATANOL)  0.1 % ophthalmic solution Place 1 drop into both eyes 2 (two) times daily as needed for allergies.   omeprazole (PRILOSEC) 40 MG capsule Take 1 capsule (40 mg total) by mouth 2 (two) times daily.   rivaroxaban (XARELTO) 20 MG TABS tablet Take 1 tablet (20 mg total) by mouth daily with supper.   rizatriptan (MAXALT) 10 MG tablet Take 1 tablet (10 mg total) by mouth as needed for migraine. May repeat in 2 hours if needed   saccharomyces boulardii (FLORASTOR) 250 MG capsule Take 250 mg by mouth daily as needed (when taking antibiotics).   sodium chloride (OCEAN) 0.65 % SOLN nasal spray Place 1 spray into both nostrils as needed for congestion.   tamsulosin (FLOMAX) 0.4 MG CAPS capsule Take 0.4 mg by mouth daily after supper.   Testosterone Cypionate 200 MG/ML SOLN Inject 100 Units into the muscle every Tuesday.   venlafaxine (EFFEXOR-XR) 150 MG 24 hr capsule Take 150 mg by mouth daily with breakfast.                Objective:    05/21/2021        224  02/26/2021      236 11/20/2020         270 09/11/2020     280  07/07/2019       265   11/29/18 272 lb (123.4 kg)  11/15/18 274 lb (124.3 kg)  11/08/18 267 lb (121.1 kg)      Vital signs reviewed  05/21/2021  - Note at rest 02 sats  97% on RA   General appearance:    amb obese wm, freq throat clearing, coughing fits c/w upper airway patter   HEENT : pt wearing mask not removed for exam due to covid -19 concerns.    NECK :  without JVD/Nodes/TM/ nl carotid upstrokes bilaterally   LUNGS: no acc muscle use,  Nl contour chest which is clear to A and P bilaterally without cough on insp or exp maneuvers   CV:  RRR  no s3 or murmur or increase in P2, and no edema   ABD:  Obese soft and nontender with nl inspiratory  excursion in the supine position. No bruits or organomegaly appreciated, bowel sounds nl  MS:  Nl gait/ ext warm without deformities, calf tenderness, cyanosis or clubbing No obvious joint restrictions   SKIN: warm and dry without lesions    NEURO:  alert, approp, nl sensorium with  no motor or cerebellar deficits apparent.      CXR PA and Lateral:   05/21/2021 :    I personally reviewed images/impression as follows:   Relatively low lung volumes, no as dz               Assessment

## 2021-05-21 NOTE — Patient Instructions (Signed)
For cough mucinex dm 1200 twice daily and use the flutter valve as much as possible   Depomedrol 120 mg IM  Please remember to go to the  x-ray department  for your tests - we will call you with the results when they are available  You are cleared for surgery

## 2021-05-21 NOTE — Assessment & Plan Note (Signed)
Quit smoking 1981 with Onset of cough around ? 2005 (throat clearing x decades) became daily around 2015 - Spirometry 11/15/2018  FEV1 3.1 (89%)  Ratio 0.74 s curvature ? On advair - FENO 11/15/2018  =   7 - symb 80 2bid 11/15/2018 > ? Helped but pt returned on symb 160 plus maint saba x 2 different versions so rec continue symb 160  - flared early Aug 2020 wit reported brown mucus and nl cxr > pulmonary ov 07/07/2019  rec augmentin x 10days and continue symb 160 2bid / depomedrol 120 mg IM > transient improvement  - 11/20/2020 all better on symbicort 160 2bid with spacer and max gerd rx/ [place] [facility]  - 02/26/2021  After extensive coaching inhaler device,  effectiveness =    90% s spacer > rec reduce symbicort to 80 2bid since the high dose may be aggravating the UACS> worse doe so resumed 160 Take 2 puffs first thing in am and then another 2 puffs about 12 hours later.    Strongly suspect this is more UACS than asthma  = Upper airway cough syndrome (previously labeled PNDS),  is so named because it's frequently impossible to sort out how much is  CR/sinusitis with freq throat clearing (which can be related to primary GERD)   vs  causing  secondary (" extra esophageal")  GERD from wide swings in gastric pressure that occur with throat clearing, often  promoting self use of mint and menthol lozenges that reduce the lower esophageal sphincter tone and exacerbate the problem further in a cyclical fashion.   These are the same pts (now being labeled as having "irritable larynx syndrome" by some cough centers) who not infrequently have a history of having failed to tolerate ace inhibitors,  dry powder inhalers or biphosphonates or report having atypical/extraesophageal reflux symptoms that don't respond to standard doses of PPI  and are easily confused as having aecopd or asthma flares by even experienced allergists/ pulmonologists (myself included).   Rec: Continue symbicort 160 2bid  Depomedrol 120 mg  IM Flutter valve training today Cleared for abd surgery as planned           Each maintenance medication was reviewed in detail including emphasizing most importantly the difference between maintenance and prns and under what circumstances the prns are to be triggered using an action plan format where appropriate.  Total time for H and P, chart review, counseling, reviewing hfa and flutter device(s) and generating customized AVS unique to this office visit / same day charting =36 min

## 2021-05-22 ENCOUNTER — Other Ambulatory Visit: Payer: Self-pay | Admitting: Surgery

## 2021-05-22 ENCOUNTER — Encounter: Payer: Self-pay | Admitting: *Deleted

## 2021-05-22 DIAGNOSIS — U071 COVID-19: Secondary | ICD-10-CM

## 2021-05-22 HISTORY — DX: COVID-19: U07.1

## 2021-05-22 LAB — SARS CORONAVIRUS 2 (TAT 6-24 HRS): SARS Coronavirus 2: POSITIVE — AB

## 2021-05-22 NOTE — Progress Notes (Signed)
[facility] OR made aware of the pt's positive covid result from [facility] on 05/22/21.

## 2021-05-23 DIAGNOSIS — Z20822 Contact with and (suspected) exposure to covid-19: Secondary | ICD-10-CM | POA: Diagnosis not present

## 2021-05-23 DIAGNOSIS — R059 Cough, unspecified: Secondary | ICD-10-CM | POA: Diagnosis not present

## 2021-05-23 DIAGNOSIS — Z03818 Encounter for observation for suspected exposure to other biological agents ruled out: Secondary | ICD-10-CM | POA: Diagnosis not present

## 2021-05-23 DIAGNOSIS — R051 Acute cough: Secondary | ICD-10-CM | POA: Diagnosis not present

## 2021-05-23 DIAGNOSIS — J069 Acute upper respiratory infection, unspecified: Secondary | ICD-10-CM | POA: Diagnosis not present

## 2021-05-23 MED ORDER — BUPIVACAINE LIPOSOME 1.3 % IJ SUSP
20.0000 mL | Freq: Once | INTRAMUSCULAR | Status: DC
  Filled 2021-05-23 (×2): qty 20

## 2021-05-24 LAB — TYPE AND SCREEN
ABO/RH(D): A POS
Antibody Screen: NEGATIVE

## 2021-05-24 NOTE — Progress Notes (Signed)
Spoke with Ivin Booty (PACU)- PT not showing for today in OR, PACU or Preop. Therefore, no PAP therapy needed at this time.

## 2021-05-27 ENCOUNTER — Other Ambulatory Visit (HOSPITAL_COMMUNITY): Payer: Self-pay

## 2021-06-03 NOTE — Progress Notes (Signed)
Date:  06/04/2021   ID:  Grant [name], DOB [birth year], MRN [id number]  PCP:  Shirline Frees, MD  Cardiologist: Dr. [name]  EP: Dr. [name]  Chief Complaint: PAF/AVR   History of Present Illness:   70 y.o. history of PAF, HTN, OSA on CPAP with AVR PVT  for bicuspid valve 2018 Bentall  (30m Edwards pericardial Magna Ease)  No CAD at cath done 06/12/17 prior to surgery   Had recurrent PAF 07/28/17 converted with iv cardizem and seen by EP Dr TLovena Lewho recommended amiodarone 200 mg daily and changed back to  xarelto for anticoagulation   History of  angioedema. 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 MRI head normal   [name] [facility] [place] [facility] but no issues   Needs robotic sigmoid resection with Dr GJohney Maine Tested preoperative positive for COVID 05/22/21 Surgery rescheduled for 06/12/21 He has had recurrent diverticulitis with stricture   TTE: 05/01/21 EF 50-55% chronically mildly elevated gradients 25 mm Edwards Magna Ease valve mean 21 mmHg  Seems to have developed a movement disorder over last 6 months Etiology not clear      Past Medical History:  Diagnosis Date   Allergic rhinitis    Anxiety    Arthritis    Asthma    Bladder tumor    Chronic fatigue    Depression    06/30/17 Pt denies being depressed, reports Effexor is taken for Chronic Fatigue    Dyspnea    Enlarged prostate    Fibromyalgia    GERD (gastroesophageal reflux disease)    Headache    History of chronic bronchitis    History of migraine    History of toxic encephalopathy    Hypothyroidism    OSA on CPAP    CPAP 14   PAF  (paroxysmal atrial fibrillation) [place] [facility] -- DR NJohnsie Cancel  DX OCT 2013   S/P AVR (aortic valve replacement) and aortoplasty    Sensitiveness to light    Unspecified essential hypertension    Urethral tumor    PROSTATIC    Past Surgical History:  Procedure Laterality Date   BENTALL PROCEDURE N/A 07/02/2017   Procedure: BENTALL PROCEDURE;  Surgeon: VIvin Poot MD;  [facility]  Service: Open Heart Surgery;  Laterality: N/A;  WITH CIRC ARREST   BIOPSY  02/06/2021   Procedure: BIOPSY;  Surgeon: MClarene Essex MD;  Location: [facility];  Service: Endoscopy;;   COLONOSCOPY     COLONOSCOPY WITH PROPOFOL N/A 02/06/2021   Procedure: COLONOSCOPY WITH PROPOFOL;  Surgeon: MClarene Essex MD;  Location: [facility];  Service: Endoscopy;  Laterality: N/A;   CYSTOSCOPY W/ RETROGRADES Bilateral 06/15/2013   Procedure: CYSTOSCOPY WITH BILATERAL RETROGRADE PYELOGRAM  BLADDER BIOPSY, PROSTATIC URETHRAL BIOPSY, ;  Surgeon: DMolli Hazard MD;  [facility]  Service: Urology;  Laterality: Bilateral;   ESOPHAGOGASTRODUODENOSCOPY (EGD) WITH PROPOFOL N/A 03/11/2018   Procedure: ESOPHAGOGASTRODUODENOSCOPY (EGD) WITH PROPOFOL;  Surgeon: MClarene Essex MD;  [facility]  Service: Endoscopy;  Laterality: N/A;   LAPAROSCOPIC CHOLECYSTECTOMY  01/14/2001   RIGHT/LEFT HEART CATH AND CORONARY ANGIOGRAPHY  N/A 06/12/2017   Procedure: RIGHT/LEFT HEART CATH AND CORONARY ANGIOGRAPHY;  Surgeon: Larey Dresser, MD;  Location: [facility];  Service: Cardiovascular;  Laterality: N/A;   TEE WITHOUT CARDIOVERSION N/A 07/02/2017   Procedure: TRANSESOPHAGEAL ECHOCARDIOGRAM (TEE);  Surgeon: Prescott Gum, Collier Salina, MD;  Location: [facility];  Service: Open Heart Surgery;  Laterality: N/A;   UMBILICAL HERNIA REPAIR  01/14/2008    Current Medications: Prior to Admission medications   Medication Sig Start Date End Date Taking? Authorizing Provider  acetaminophen (TYLENOL) 500 MG  tablet Take 500 mg by mouth every 6 (six) hours as needed for moderate pain.    [provider]  albuterol (VENTOLIN HFA) 108 (90 Base) MCG/ACT inhaler INHALE 2 PUFFS INTO LUNGS EVERY 6 HOURS AS NEEDED FOR WHEEZING OR SHORTNESS OF BREATH 01/19/17   Baird Lyons D, MD  amiodarone (PACERONE) 200 MG tablet Take 1 tablet (200 mg total) by mouth daily. 07/29/17   Leanor Kail, PA  aspirin 81 MG EC tablet Take 1 tablet (81 mg total) by mouth daily. 07/13/17   Gold, Wilder Glade, PA-C  azelastine (ASTELIN) 137 MCG/SPRAY nasal spray Place 1-2 sprays into the nose 2 (two) times daily as needed for rhinitis or allergies.     [provider]  benzonatate (TESSALON) 200 MG capsule Take 1 capsule (200 mg total) by mouth every 6 (six) hours as needed for cough. 11/19/16   Baird Lyons D, MD  budesonide-formoterol (SYMBICORT) 160-4.5 MCG/ACT inhaler INHALE 2 PUFF INTO THE LUNGS 2 TIMES A DAY. 01/19/17   Baird Lyons D, MD  Cholecalciferol (VITAMIN D-3) 5000 UNITS TABS Take 5,000 Units by mouth daily with breakfast.     [provider]  COLCRYS 0.6 MG tablet Take 0.6 mg by mouth daily. With breakfast 01/29/12   [provider]  Cyanocobalamin (VITAMIN B12 SL) Place 1,200 mcg under the tongue daily. 1 dropper    [provider]  dextromethorphan (DELSYM) 30 MG/5ML liquid Take 30 mg by mouth at bedtime as needed for cough.     [provider]  DHEA 50 MG TABS Take 50 mg by mouth daily with breakfast.     [provider]  fexofenadine (ALLEGRA) 180 MG tablet Take 180 mg by mouth daily with breakfast.     [provider]  finasteride (PROSCAR) 5 MG tablet Take 5 mg by mouth at bedtime.  01/14/14   [provider]  Hypromellose Vertell Limber ALLERGY NASAL SPRAY NA) Place 1 spray into both nostrils daily as needed (for allergies).     [provider]  levothyroxine (SYNTHROID, LEVOTHROID) 112 MCG tablet Take 112 mcg by mouth daily before  breakfast.     [provider]  liothyronine (CYTOMEL) 25 MCG tablet Take 25 mcg by mouth every morning.     [provider]  magic mouthwash w/lidocaine SOLN Take 10 mLs by mouth 4 (four) times daily as needed for mouth pain. 07/16/17   Deneise Lever, MD  Menthol, Topical Analgesic, (BLUE-EMU MAXIMUM STRENGTH EX) Apply 1 application topically 4 (four) times daily as needed (for arthritis pain.).    [provider]  metoprolol tartrate (LOPRESSOR) 50 MG tablet Take 1 tablet (50 mg total) by mouth 2 (two) times daily. 07/29/17   Bhagat, Bhavinkumar, PA  milk thistle 175 MG tablet Take 175 mg by mouth daily.    [provider]  Multiple Vitamins-Minerals (ONE-A-DAY MENS HEALTH FORMULA PO) Take 1 tablet by mouth daily with breakfast.  [provider]  omeprazole (PRILOSEC) 40 MG capsule Take 1 capsule (40 mg total) by mouth 2 (two) times daily. 10/31/16   Josue Hector, MD  perphenazine (TRILAFON) 2 MG tablet Take 2 mg by mouth 2 (two) times daily as needed (for migraines).     [provider]  Probiotic Product (ALIGN) 4 MG CAPS Take 4 mg by mouth daily with breakfast.     [provider]  tamsulosin (FLOMAX) 0.4 MG CAPS capsule Take 0.4 mg by mouth daily after supper.    [provider]  testosterone cypionate (DEPOTESTOTERONE CYPIONATE) 100 MG/ML injection Inject 200 mg into the muscle every Thursday. For IM use only     [provider]  tetrahydrozoline-zinc (VISINE-AC) 0.05-0.25 % ophthalmic solution Place 1 drop into both eyes as needed (dry eyes).    [provider]  venlafaxine (EFFEXOR-XR) 150 MG 24 hr capsule Take 150 mg by mouth every morning.     [provider]  warfarin (COUMADIN) 2.5 MG tablet Take 1 tablet (2.5 mg total) by mouth daily at 6 PM. Take 7.'5mg'$  (3 tablet tonight) and '5mg'$  tomorrow and INR check Friday Patient taking differently: Take 2.5 mg by mouth daily at 6 PM. Take 7.'5mg'$   (3 tablet tonight) and '5mg'$  tomorrow and INR check Friday and as directed 07/29/17   Leanor Kail, PA    Allergies:   Perphenazine, Amiodarone, Codeine, and Tramadol   Social History   Socioeconomic History   Marital status: Married    Spouse name: Not on file   Number of children: Not on file   Years of education: Not on file   Highest education level: Not on file  Occupational History   Occupation: disabled    Comment: owner of [employer]  Tobacco Use   Smoking status: Former    Packs/day: 0.50    Years: 27.00    Pack years: 13.50    Types: Cigarettes    Start date: 1970    Quit date: 10/21/1979    Years since quitting: 41.6   Smokeless tobacco: Never  Vaping Use   Vaping Use: Never used  Substance and Sexual Activity   Alcohol use: Yes    Alcohol/week: 4.0 standard drinks    Types: 4 Cans of beer per week    Comment: drinks "as much as I could" - 2-3 beers up to 8-10   Drug use: Never   Sexual activity: Not on file  Other Topics Concern   Not on file  Social History Narrative   Right handed   One story home   Drinks no caffeine   Social Determinants of Health   Financial Resource Strain: Not on file  Food Insecurity: Not on file  Transportation Needs: Not on file  Physical Activity: Not on file  Stress: Not on file  Social Connections: Not on file     Family History:  The patient's family history includes Aortic aneurysm (age of onset: 59) in his mother; Heart disease in an other family member; Other in his father.   ROS:   Please see the history of present illness.    ROS All other systems reviewed and are negative.   PHYSICAL EXAM:   VS:  BP 126/80   Pulse 72   Ht '5\' 10"'$  (1.778 m)   Wt 101.1 kg   SpO2 97%   BMI 31.97 kg/m     Affect appropriate Obese white male  HEENT: normal Neck supple with no adenopathy JVP normal no  bruits no thyromegaly Lungs clear with no wheezing and good diaphragmatic motion Heart:  S1/S2 SEM no AR  murmur, no  rub, gallop or click PMI normal post sternotomy  Abdomen: benighn, BS positve, no tenderness, no AAA no bruit.  No HSM or HJR Distal pulses intact with no bruits No edema Neuro non-focal Skin warm and dry No muscular weakness    Wt Readings from Last 3 Encounters:  06/04/21 101.1 kg  05/21/21 101.6 kg  05/16/21 98.9 kg      Studies/Labs Reviewed:   EKG:   02/16/19 SR LAD nonspecific ST changes 06/04/2021 NSR rate 77 sinus arrhythmia stable   Recent Labs: 10/13/2020: Magnesium 1.8 12/25/2020: ALT 34 05/16/2021: BUN 14; Creatinine, Ser 0.79; Hemoglobin 15.2; Platelets 178; Potassium 4.3; Sodium 139   Lipid Panel    Component Value Date/Time   CHOL 185 02/17/2019 0714   CHOL 184 05/05/2017 0931   TRIG 234 (H) 02/17/2019 0714   HDL 42 02/17/2019 0714   HDL 45 05/05/2017 0931   CHOLHDL 4.4 02/17/2019 0714   VLDL 47 (H) 02/17/2019 0714   LDLCALC 96 02/17/2019 0714   LDLCALC 94 05/05/2017 0931    Additional studies/ records that were reviewed today include:  As above   ASSESSMENT & PLAN:    1. PAF - on xarelto continue beta blocker off amiodarone now Hold xarelto 2 days before surgery   2. HTN - Well controlled.  Continue current medications and low sodium Dash type diet.     3.  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 Prescott Gum 07/02/17. Post op echo with stable gradients no AR  -TTE 05/01/21 EF 50-55% 25 mm Edwards Magna Ease valve mean gradient 21 peak 42 mmHg AVA 1.3 cm2 DVI 0.35  4. OSA -Compliant with CPAP.  5. 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 F/U allergist cannot take oral steroids due to MS changes   6. Global Amnesia:  F/u neurology MRI/EEG ok ? Movement disorder improving etiology not clear   7. Preoperative:  for robotic sigmoid resection 06/12/21 clear to have hold xarelto 48 hours before   F/U in 6 months      [name]

## 2021-06-04 ENCOUNTER — Other Ambulatory Visit: Payer: Self-pay

## 2021-06-04 ENCOUNTER — Encounter: Payer: Self-pay | Admitting: Cardiovascular Disease

## 2021-06-04 ENCOUNTER — Ambulatory Visit: Payer: Medicare Other | Admitting: Cardiovascular Disease

## 2021-06-04 VITALS — BP 126/80 | HR 72 | Ht 70.0 in | Wt 222.8 lb

## 2021-06-04 DIAGNOSIS — Z952 Presence of prosthetic heart valve: Secondary | ICD-10-CM

## 2021-06-04 DIAGNOSIS — Z01810 Encounter for preprocedural cardiovascular examination: Secondary | ICD-10-CM

## 2021-06-04 DIAGNOSIS — I480 Paroxysmal atrial fibrillation: Secondary | ICD-10-CM | POA: Diagnosis not present

## 2021-06-04 DIAGNOSIS — Z7901 Long term (current) use of anticoagulants: Secondary | ICD-10-CM

## 2021-06-04 DIAGNOSIS — G4733 Obstructive sleep apnea (adult) (pediatric): Secondary | ICD-10-CM | POA: Diagnosis not present

## 2021-06-04 MED ORDER — RIVAROXABAN 20 MG PO TABS: 20.0000 mg | ORAL_TABLET | Freq: Every day | ORAL | 3 refills | Status: DC

## 2021-06-04 MED ORDER — METOPROLOL TARTRATE 50 MG PO TABS: 50.0000 mg | ORAL_TABLET | Freq: Two times a day (BID) | ORAL | 3 refills | Status: DC

## 2021-06-04 NOTE — Patient Instructions (Addendum)

## 2021-06-05 DIAGNOSIS — G4733 Obstructive sleep apnea (adult) (pediatric): Secondary | ICD-10-CM | POA: Diagnosis not present

## 2021-06-06 ENCOUNTER — Encounter (HOSPITAL_COMMUNITY): Payer: Self-pay | Admitting: Surgery

## 2021-06-06 NOTE — Progress Notes (Addendum)
Spoke to patient's wife and gave her updated arrival time of 72 and NPO status of 0900.  Only change in medical history is +Covid on 05-22-21.  Patient knows to hold Xarelto two days prior to surgery.   Patient's wife stated understanding and will call back with questions.   Ostomy nurses notified of rescheduling of surgery in case patient needed to be marked again.

## 2021-06-11 ENCOUNTER — Other Ambulatory Visit (HOSPITAL_COMMUNITY): Payer: Self-pay

## 2021-06-12 ENCOUNTER — Encounter (HOSPITAL_COMMUNITY): Payer: Self-pay | Admitting: Surgery

## 2021-06-12 ENCOUNTER — Other Ambulatory Visit (HOSPITAL_COMMUNITY): Payer: Self-pay

## 2021-06-12 ENCOUNTER — Encounter (HOSPITAL_COMMUNITY)
Admission: RE | Disposition: A | Discharge status: Home / Self Care | Payer: Self-pay | Source: Home / Self Care | Attending: Surgery

## 2021-06-12 ENCOUNTER — Inpatient Hospital Stay (HOSPITAL_COMMUNITY)
Admission: RE | Admit: 2021-06-12 | Discharge: 2021-06-14 | DRG: 330 | Disposition: A | Discharge status: Home / Self Care | Payer: Medicare Other | Attending: Surgery | Admitting: Surgery

## 2021-06-12 ENCOUNTER — Inpatient Hospital Stay (HOSPITAL_COMMUNITY): Payer: Medicare Other | Admitting: Anesthesiology

## 2021-06-12 ENCOUNTER — Inpatient Hospital Stay (HOSPITAL_COMMUNITY): Payer: Medicare Other | Admitting: Physician Assistant

## 2021-06-12 DIAGNOSIS — K5720 Diverticulitis of large intestine with perforation and abscess without bleeding: Secondary | ICD-10-CM | POA: Diagnosis not present

## 2021-06-12 DIAGNOSIS — Z6831 Body mass index (BMI) 31.0-31.9, adult: Secondary | ICD-10-CM

## 2021-06-12 DIAGNOSIS — F419 Anxiety disorder, unspecified: Secondary | ICD-10-CM | POA: Diagnosis present

## 2021-06-12 DIAGNOSIS — Z952 Presence of prosthetic heart valve: Secondary | ICD-10-CM

## 2021-06-12 DIAGNOSIS — M109 Gout, unspecified: Secondary | ICD-10-CM | POA: Diagnosis not present

## 2021-06-12 DIAGNOSIS — Z860100 Personal history of colon polyps, unspecified: Secondary | ICD-10-CM

## 2021-06-12 DIAGNOSIS — J45909 Unspecified asthma, uncomplicated: Secondary | ICD-10-CM | POA: Diagnosis not present

## 2021-06-12 DIAGNOSIS — J42 Unspecified chronic bronchitis: Secondary | ICD-10-CM | POA: Diagnosis present

## 2021-06-12 DIAGNOSIS — Z888 Allergy status to other drugs, medicaments and biological substances status: Secondary | ICD-10-CM | POA: Diagnosis not present

## 2021-06-12 DIAGNOSIS — F3340 Major depressive disorder, recurrent, in remission, unspecified: Secondary | ICD-10-CM | POA: Diagnosis present

## 2021-06-12 DIAGNOSIS — E782 Mixed hyperlipidemia: Secondary | ICD-10-CM | POA: Diagnosis present

## 2021-06-12 DIAGNOSIS — Z87891 Personal history of nicotine dependence: Secondary | ICD-10-CM

## 2021-06-12 DIAGNOSIS — E039 Hypothyroidism, unspecified: Secondary | ICD-10-CM | POA: Diagnosis not present

## 2021-06-12 DIAGNOSIS — N400 Enlarged prostate without lower urinary tract symptoms: Secondary | ICD-10-CM | POA: Diagnosis present

## 2021-06-12 DIAGNOSIS — R251 Tremor, unspecified: Secondary | ICD-10-CM | POA: Diagnosis present

## 2021-06-12 DIAGNOSIS — Z8616 Personal history of COVID-19: Secondary | ICD-10-CM | POA: Diagnosis not present

## 2021-06-12 DIAGNOSIS — R319 Hematuria, unspecified: Secondary | ICD-10-CM | POA: Diagnosis present

## 2021-06-12 DIAGNOSIS — Z79890 Hormone replacement therapy (postmenopausal): Secondary | ICD-10-CM

## 2021-06-12 DIAGNOSIS — E876 Hypokalemia: Secondary | ICD-10-CM

## 2021-06-12 DIAGNOSIS — I10 Essential (primary) hypertension: Secondary | ICD-10-CM | POA: Diagnosis not present

## 2021-06-12 DIAGNOSIS — K5732 Diverticulitis of large intestine without perforation or abscess without bleeding: Secondary | ICD-10-CM | POA: Diagnosis not present

## 2021-06-12 DIAGNOSIS — K219 Gastro-esophageal reflux disease without esophagitis: Secondary | ICD-10-CM | POA: Diagnosis present

## 2021-06-12 DIAGNOSIS — Z79899 Other long term (current) drug therapy: Secondary | ICD-10-CM

## 2021-06-12 DIAGNOSIS — Z885 Allergy status to narcotic agent status: Secondary | ICD-10-CM | POA: Diagnosis not present

## 2021-06-12 DIAGNOSIS — Z8249 Family history of ischemic heart disease and other diseases of the circulatory system: Secondary | ICD-10-CM

## 2021-06-12 DIAGNOSIS — K56699 Other intestinal obstruction unspecified as to partial versus complete obstruction: Secondary | ICD-10-CM | POA: Diagnosis present

## 2021-06-12 DIAGNOSIS — I480 Paroxysmal atrial fibrillation: Secondary | ICD-10-CM | POA: Diagnosis not present

## 2021-06-12 DIAGNOSIS — Z7951 Long term (current) use of inhaled steroids: Secondary | ICD-10-CM

## 2021-06-12 DIAGNOSIS — M797 Fibromyalgia: Secondary | ICD-10-CM | POA: Diagnosis present

## 2021-06-12 DIAGNOSIS — G4733 Obstructive sleep apnea (adult) (pediatric): Secondary | ICD-10-CM | POA: Diagnosis not present

## 2021-06-12 DIAGNOSIS — I4891 Unspecified atrial fibrillation: Secondary | ICD-10-CM | POA: Diagnosis present

## 2021-06-12 DIAGNOSIS — J302 Other seasonal allergic rhinitis: Secondary | ICD-10-CM | POA: Diagnosis present

## 2021-06-12 DIAGNOSIS — Z825 Family history of asthma and other chronic lower respiratory diseases: Secondary | ICD-10-CM

## 2021-06-12 HISTORY — PX: PROCTOSCOPY: SHX2266

## 2021-06-12 LAB — TYPE AND SCREEN
ABO/RH(D): A POS
Antibody Screen: NEGATIVE

## 2021-06-12 SURGERY — COLECTOMY, SIGMOID, ROBOT-ASSISTED
Anesthesia: General | Site: Abdomen

## 2021-06-12 MED ORDER — ENALAPRILAT 1.25 MG/ML IV SOLN
0.6250 mg | Freq: Four times a day (QID) | INTRAVENOUS | Status: DC | PRN
  Filled 2021-06-12: qty 1

## 2021-06-12 MED ORDER — ALPRAZOLAM 0.25 MG PO TABS
0.2500 mg | ORAL_TABLET | Freq: Three times a day (TID) | ORAL | Status: DC | PRN
  Administered 2021-06-13 – 2021-06-14 (×2): 0.25 mg via ORAL
  Filled 2021-06-12 (×2): qty 1

## 2021-06-12 MED ORDER — TAMSULOSIN HCL 0.4 MG PO CAPS
0.4000 mg | ORAL_CAPSULE | Freq: Every day | ORAL | Status: DC
  Administered 2021-06-12 – 2021-06-13 (×2): 0.4 mg via ORAL
  Filled 2021-06-12 (×2): qty 1

## 2021-06-12 MED ORDER — FUROSEMIDE 10 MG/ML IJ SOLN
INTRAMUSCULAR | Status: AC
  Filled 2021-06-12: qty 2

## 2021-06-12 MED ORDER — MIDAZOLAM HCL 2 MG/2ML IJ SOLN
INTRAMUSCULAR | Status: AC
  Filled 2021-06-12: qty 2

## 2021-06-12 MED ORDER — ALVIMOPAN 12 MG PO CAPS
12.0000 mg | ORAL_CAPSULE | ORAL | Status: AC
  Administered 2021-06-12: 12 mg via ORAL
  Filled 2021-06-12: qty 1

## 2021-06-12 MED ORDER — ENOXAPARIN SODIUM 40 MG/0.4ML IJ SOSY
40.0000 mg | PREFILLED_SYRINGE | Freq: Once | INTRAMUSCULAR | Status: AC
  Administered 2021-06-12: 40 mg via SUBCUTANEOUS
  Filled 2021-06-12: qty 0.4

## 2021-06-12 MED ORDER — FINASTERIDE 5 MG PO TABS
5.0000 mg | ORAL_TABLET | Freq: Every day | ORAL | Status: DC
  Administered 2021-06-12 – 2021-06-13 (×2): 5 mg via ORAL
  Filled 2021-06-12 (×2): qty 1

## 2021-06-12 MED ORDER — ALBUTEROL SULFATE (2.5 MG/3ML) 0.083% IN NEBU: 3.0000 mL | INHALATION_SOLUTION | Freq: Four times a day (QID) | RESPIRATORY_TRACT | Status: DC | PRN

## 2021-06-12 MED ORDER — POLYETHYLENE GLYCOL 3350 17 GM/SCOOP PO POWD: 1.0000 | Freq: Once | ORAL | Status: DC

## 2021-06-12 MED ORDER — HYDROMORPHONE HCL 1 MG/ML IJ SOLN
0.5000 mg | INTRAMUSCULAR | Status: DC | PRN
  Administered 2021-06-12 – 2021-06-13 (×2): 1 mg via INTRAVENOUS
  Filled 2021-06-12 (×3): qty 1

## 2021-06-12 MED ORDER — PHENYLEPHRINE HCL-NACL 20-0.9 MG/250ML-% IV SOLN
INTRAVENOUS | Status: DC | PRN
  Administered 2021-06-12: 25 ug/min via INTRAVENOUS

## 2021-06-12 MED ORDER — AZELASTINE HCL 0.1 % NA SOLN: 1.0000 | Freq: Two times a day (BID) | NASAL | Status: DC | PRN

## 2021-06-12 MED ORDER — ONDANSETRON HCL 4 MG PO TABS: 4.0000 mg | ORAL_TABLET | Freq: Four times a day (QID) | ORAL | Status: DC | PRN

## 2021-06-12 MED ORDER — CLOBETASOL PROPIONATE 0.05 % EX OINT: 1.0000 "application " | TOPICAL_OINTMENT | Freq: Two times a day (BID) | CUTANEOUS | Status: DC | PRN

## 2021-06-12 MED ORDER — KETAMINE HCL 10 MG/ML IJ SOLN
INTRAMUSCULAR | Status: DC | PRN
  Administered 2021-06-12: 20 mg via INTRAVENOUS

## 2021-06-12 MED ORDER — LEVOTHYROXINE SODIUM 112 MCG PO TABS
112.0000 ug | ORAL_TABLET | Freq: Every day | ORAL | Status: DC
  Administered 2021-06-13 – 2021-06-14 (×2): 112 ug via ORAL
  Filled 2021-06-12 (×2): qty 1

## 2021-06-12 MED ORDER — METOPROLOL TARTRATE 5 MG/5ML IV SOLN: 5.0000 mg | Freq: Four times a day (QID) | INTRAVENOUS | Status: DC | PRN

## 2021-06-12 MED ORDER — ENSURE SURGERY PO LIQD
237.0000 mL | Freq: Two times a day (BID) | ORAL | Status: DC
  Administered 2021-06-13 – 2021-06-14 (×2): 237 mL via ORAL

## 2021-06-12 MED ORDER — SUGAMMADEX SODIUM 500 MG/5ML IV SOLN
INTRAVENOUS | Status: AC
  Filled 2021-06-12: qty 5

## 2021-06-12 MED ORDER — DEXAMETHASONE SODIUM PHOSPHATE 10 MG/ML IJ SOLN
INTRAMUSCULAR | Status: DC | PRN
  Administered 2021-06-12: 6 mg via INTRAVENOUS

## 2021-06-12 MED ORDER — DIPHENHYDRAMINE HCL 12.5 MG/5ML PO ELIX: 12.5000 mg | ORAL_SOLUTION | Freq: Four times a day (QID) | ORAL | Status: DC | PRN

## 2021-06-12 MED ORDER — PROCHLORPERAZINE MALEATE 10 MG PO TABS: 10.0000 mg | ORAL_TABLET | Freq: Four times a day (QID) | ORAL | Status: DC | PRN

## 2021-06-12 MED ORDER — DM-GUAIFENESIN ER 30-600 MG PO TB12: 1.0000 | ORAL_TABLET | Freq: Two times a day (BID) | ORAL | Status: DC | PRN

## 2021-06-12 MED ORDER — DEXAMETHASONE SODIUM PHOSPHATE 10 MG/ML IJ SOLN
INTRAMUSCULAR | Status: AC
  Filled 2021-06-12: qty 2

## 2021-06-12 MED ORDER — FENTANYL CITRATE (PF) 100 MCG/2ML IJ SOLN
25.0000 ug | INTRAMUSCULAR | Status: DC | PRN
  Administered 2021-06-12: 50 ug via INTRAVENOUS
  Administered 2021-06-12 (×2): 25 ug via INTRAVENOUS
  Administered 2021-06-12: 50 ug via INTRAVENOUS

## 2021-06-12 MED ORDER — GABAPENTIN 300 MG PO CAPS
300.0000 mg | ORAL_CAPSULE | ORAL | Status: AC
  Administered 2021-06-12: 300 mg via ORAL
  Filled 2021-06-12: qty 1

## 2021-06-12 MED ORDER — ROCURONIUM BROMIDE 10 MG/ML (PF) SYRINGE
PREFILLED_SYRINGE | INTRAVENOUS | Status: AC
  Filled 2021-06-12: qty 20

## 2021-06-12 MED ORDER — ALVIMOPAN 12 MG PO CAPS
12.0000 mg | ORAL_CAPSULE | Freq: Two times a day (BID) | ORAL | Status: DC
  Filled 2021-06-12 (×2): qty 1

## 2021-06-12 MED ORDER — MELATONIN 3 MG PO TABS
3.0000 mg | ORAL_TABLET | Freq: Every evening | ORAL | Status: DC | PRN
  Administered 2021-06-13: 3 mg via ORAL
  Filled 2021-06-12: qty 1

## 2021-06-12 MED ORDER — FENTANYL CITRATE (PF) 100 MCG/2ML IJ SOLN
INTRAMUSCULAR | Status: DC | PRN
  Administered 2021-06-12: 50 ug via INTRAVENOUS
  Administered 2021-06-12: 100 ug via INTRAVENOUS
  Administered 2021-06-12: 50 ug via INTRAVENOUS

## 2021-06-12 MED ORDER — AMISULPRIDE (ANTIEMETIC) 5 MG/2ML IV SOLN: 10.0000 mg | Freq: Once | INTRAVENOUS | Status: DC | PRN

## 2021-06-12 MED ORDER — BUPIVACAINE LIPOSOME 1.3 % IJ SUSP
INTRAMUSCULAR | Status: DC | PRN
  Administered 2021-06-12: 20 mL

## 2021-06-12 MED ORDER — ENOXAPARIN SODIUM 40 MG/0.4ML IJ SOSY
40.0000 mg | PREFILLED_SYRINGE | INTRAMUSCULAR | Status: DC
  Administered 2021-06-13 – 2021-06-14 (×2): 40 mg via SUBCUTANEOUS
  Filled 2021-06-12 (×2): qty 0.4

## 2021-06-12 MED ORDER — EPHEDRINE 5 MG/ML INJ
INTRAVENOUS | Status: AC
  Filled 2021-06-12: qty 5

## 2021-06-12 MED ORDER — BUPIVACAINE-EPINEPHRINE (PF) 0.25% -1:200000 IJ SOLN
INTRAMUSCULAR | Status: DC | PRN
  Administered 2021-06-12: 60 mL

## 2021-06-12 MED ORDER — BENZONATATE 100 MG PO CAPS: 200.0000 mg | ORAL_CAPSULE | Freq: Three times a day (TID) | ORAL | Status: DC | PRN

## 2021-06-12 MED ORDER — MAGIC MOUTHWASH
15.0000 mL | Freq: Four times a day (QID) | ORAL | Status: DC | PRN
  Filled 2021-06-12: qty 15

## 2021-06-12 MED ORDER — LIP MEDEX EX OINT
1.0000 "application " | TOPICAL_OINTMENT | Freq: Two times a day (BID) | CUTANEOUS | Status: DC
  Administered 2021-06-12 – 2021-06-14 (×4): 1 via TOPICAL
  Filled 2021-06-12: qty 7

## 2021-06-12 MED ORDER — INDOCYANINE GREEN 25 MG IV SOLR
INTRAVENOUS | Status: DC | PRN
  Administered 2021-06-12: 5 mg via INTRAVENOUS

## 2021-06-12 MED ORDER — BUPIVACAINE-EPINEPHRINE (PF) 0.25% -1:200000 IJ SOLN
INTRAMUSCULAR | Status: AC
  Filled 2021-06-12: qty 60

## 2021-06-12 MED ORDER — NEOMYCIN SULFATE 500 MG PO TABS: 1000.0000 mg | ORAL_TABLET | ORAL | Status: DC

## 2021-06-12 MED ORDER — CALCIUM CARBONATE ANTACID 500 MG PO CHEW: 500.0000 mg | CHEWABLE_TABLET | Freq: Every day | ORAL | Status: DC | PRN

## 2021-06-12 MED ORDER — SUGAMMADEX SODIUM 500 MG/5ML IV SOLN
INTRAVENOUS | Status: DC | PRN
  Administered 2021-06-12: 300 mg via INTRAVENOUS

## 2021-06-12 MED ORDER — LOSARTAN POTASSIUM 50 MG PO TABS
50.0000 mg | ORAL_TABLET | Freq: Every day | ORAL | Status: DC
  Administered 2021-06-13 – 2021-06-14 (×2): 50 mg via ORAL
  Filled 2021-06-12 (×2): qty 1

## 2021-06-12 MED ORDER — MIDAZOLAM HCL 5 MG/5ML IJ SOLN
INTRAMUSCULAR | Status: DC | PRN
  Administered 2021-06-12: 2 mg via INTRAVENOUS

## 2021-06-12 MED ORDER — FENTANYL CITRATE (PF) 100 MCG/2ML IJ SOLN
INTRAMUSCULAR | Status: AC
  Filled 2021-06-12: qty 2

## 2021-06-12 MED ORDER — SODIUM CHLORIDE 0.9 % IV SOLN
2.0000 g | INTRAVENOUS | Status: AC
  Administered 2021-06-12: 2 g via INTRAVENOUS
  Filled 2021-06-12: qty 2

## 2021-06-12 MED ORDER — COLCHICINE 0.6 MG PO TABS
0.6000 mg | ORAL_TABLET | Freq: Every day | ORAL | Status: DC
  Administered 2021-06-13 – 2021-06-14 (×2): 0.6 mg via ORAL
  Filled 2021-06-12 (×2): qty 1

## 2021-06-12 MED ORDER — ENSURE PRE-SURGERY PO LIQD
296.0000 mL | Freq: Once | ORAL | Status: DC
  Filled 2021-06-12: qty 296

## 2021-06-12 MED ORDER — LIDOCAINE 2% (20 MG/ML) 5 ML SYRINGE
INTRAMUSCULAR | Status: AC
  Filled 2021-06-12: qty 10

## 2021-06-12 MED ORDER — ALUM & MAG HYDROXIDE-SIMETH 200-200-20 MG/5ML PO SUSP: 30.0000 mL | Freq: Four times a day (QID) | ORAL | Status: DC | PRN

## 2021-06-12 MED ORDER — SODIUM CHLORIDE 0.9 % IV SOLN: Freq: Three times a day (TID) | INTRAVENOUS | Status: DC | PRN

## 2021-06-12 MED ORDER — LIDOCAINE 2% (20 MG/ML) 5 ML SYRINGE
INTRAMUSCULAR | Status: DC | PRN
  Administered 2021-06-12: 80 mg via INTRAVENOUS

## 2021-06-12 MED ORDER — ONDANSETRON HCL 4 MG/2ML IJ SOLN
INTRAMUSCULAR | Status: AC
  Filled 2021-06-12: qty 4

## 2021-06-12 MED ORDER — NAPHAZOLINE-GLYCERIN 0.012-0.25 % OP SOLN
1.0000 [drp] | Freq: Four times a day (QID) | OPHTHALMIC | Status: DC | PRN
Start: 2021-06-12 — End: 2021-06-14

## 2021-06-12 MED ORDER — ONDANSETRON HCL 4 MG/2ML IJ SOLN: 4.0000 mg | Freq: Four times a day (QID) | INTRAMUSCULAR | Status: DC | PRN

## 2021-06-12 MED ORDER — STERILE WATER FOR INJECTION IJ SOLN
INTRAMUSCULAR | Status: AC
  Filled 2021-06-12: qty 10

## 2021-06-12 MED ORDER — SODIUM CHLORIDE 0.9 % IR SOLN
Status: DC | PRN
  Administered 2021-06-12: 1000 mL

## 2021-06-12 MED ORDER — VENLAFAXINE HCL ER 150 MG PO CP24
150.0000 mg | ORAL_CAPSULE | Freq: Every day | ORAL | Status: DC
  Administered 2021-06-13 – 2021-06-14 (×2): 150 mg via ORAL
  Filled 2021-06-12 (×2): qty 1

## 2021-06-12 MED ORDER — LIP MEDEX EX OINT
TOPICAL_OINTMENT | CUTANEOUS | Status: AC
  Administered 2021-06-12: 1 via TOPICAL
  Filled 2021-06-12: qty 7

## 2021-06-12 MED ORDER — METOPROLOL TARTRATE 50 MG PO TABS
50.0000 mg | ORAL_TABLET | Freq: Two times a day (BID) | ORAL | Status: DC
  Administered 2021-06-12 – 2021-06-14 (×4): 50 mg via ORAL
  Filled 2021-06-12 (×4): qty 1

## 2021-06-12 MED ORDER — LACTATED RINGERS IV SOLN: INTRAVENOUS | Status: DC

## 2021-06-12 MED ORDER — EPINEPHRINE 0.3 MG/0.3ML IJ SOAJ
0.3000 mg | INTRAMUSCULAR | Status: DC | PRN
  Filled 2021-06-12: qty 0.6

## 2021-06-12 MED ORDER — ONDANSETRON HCL 4 MG/2ML IJ SOLN
INTRAMUSCULAR | Status: DC | PRN
  Administered 2021-06-12: 4 mg via INTRAVENOUS

## 2021-06-12 MED ORDER — SUMATRIPTAN SUCCINATE 50 MG PO TABS
50.0000 mg | ORAL_TABLET | ORAL | Status: DC | PRN
  Administered 2021-06-14: 50 mg via ORAL
  Filled 2021-06-12 (×3): qty 2

## 2021-06-12 MED ORDER — OXYCODONE HCL 5 MG PO TABS
5.0000 mg | ORAL_TABLET | ORAL | Status: DC | PRN
  Administered 2021-06-12: 10 mg via ORAL
  Administered 2021-06-12: 5 mg via ORAL
  Administered 2021-06-13 (×4): 10 mg via ORAL
  Filled 2021-06-12 (×2): qty 2
  Filled 2021-06-12: qty 1
  Filled 2021-06-12 (×3): qty 2

## 2021-06-12 MED ORDER — LIOTHYRONINE SODIUM 25 MCG PO TABS
25.0000 ug | ORAL_TABLET | Freq: Every day | ORAL | Status: DC
  Administered 2021-06-13 – 2021-06-14 (×2): 25 ug via ORAL
  Filled 2021-06-12 (×2): qty 1

## 2021-06-12 MED ORDER — FENTANYL CITRATE (PF) 100 MCG/2ML IJ SOLN
INTRAMUSCULAR | Status: AC
  Filled 2021-06-12: qty 4

## 2021-06-12 MED ORDER — SALINE SPRAY 0.65 % NA SOLN: 1.0000 | NASAL | Status: DC | PRN

## 2021-06-12 MED ORDER — PROCHLORPERAZINE EDISYLATE 10 MG/2ML IJ SOLN: 5.0000 mg | Freq: Four times a day (QID) | INTRAMUSCULAR | Status: DC | PRN

## 2021-06-12 MED ORDER — EPHEDRINE SULFATE-NACL 50-0.9 MG/10ML-% IV SOSY
PREFILLED_SYRINGE | INTRAVENOUS | Status: DC | PRN
  Administered 2021-06-12: 5 mg via INTRAVENOUS
  Administered 2021-06-12: 10 mg via INTRAVENOUS
  Administered 2021-06-12: 5 mg via INTRAVENOUS
  Administered 2021-06-12 (×2): 10 mg via INTRAVENOUS

## 2021-06-12 MED ORDER — CHLORHEXIDINE GLUCONATE 0.12 % MT SOLN
15.0000 mL | Freq: Once | OROMUCOSAL | Status: AC
  Administered 2021-06-12: 15 mL via OROMUCOSAL

## 2021-06-12 MED ORDER — ORAL CARE MOUTH RINSE: 15.0000 mL | Freq: Once | OROMUCOSAL | Status: AC

## 2021-06-12 MED ORDER — DICLOFENAC SODIUM 1 % EX GEL: 2.0000 g | Freq: Every day | CUTANEOUS | Status: DC | PRN

## 2021-06-12 MED ORDER — LACTATED RINGERS IR SOLN
Status: DC | PRN
  Administered 2021-06-12: 2000 mL

## 2021-06-12 MED ORDER — ACETAMINOPHEN 500 MG PO TABS
1000.0000 mg | ORAL_TABLET | Freq: Four times a day (QID) | ORAL | Status: DC
  Administered 2021-06-12 – 2021-06-14 (×7): 1000 mg via ORAL
  Filled 2021-06-12 (×7): qty 2

## 2021-06-12 MED ORDER — OXYCODONE HCL 5 MG PO TABS
5.0000 mg | ORAL_TABLET | Freq: Four times a day (QID) | ORAL | 0 refills | Status: DC | PRN
  Filled 2021-06-12: qty 20, 3d supply, fill #0

## 2021-06-12 MED ORDER — ACETAMINOPHEN 500 MG PO TABS
1000.0000 mg | ORAL_TABLET | ORAL | Status: AC
  Administered 2021-06-12: 1000 mg via ORAL
  Filled 2021-06-12: qty 2

## 2021-06-12 MED ORDER — ENSURE PRE-SURGERY PO LIQD
592.0000 mL | Freq: Once | ORAL | Status: DC
  Filled 2021-06-12: qty 592

## 2021-06-12 MED ORDER — ACETAMINOPHEN 10 MG/ML IV SOLN: 1000.0000 mg | Freq: Once | INTRAVENOUS | Status: DC | PRN

## 2021-06-12 MED ORDER — STERILE WATER FOR INJECTION IJ SOLN
INTRAMUSCULAR | Status: DC | PRN
  Administered 2021-06-12: 15 mL

## 2021-06-12 MED ORDER — FUROSEMIDE 10 MG/ML IJ SOLN
20.0000 mg | Freq: Once | INTRAMUSCULAR | Status: AC
  Administered 2021-06-12: 20 mg via INTRAVENOUS

## 2021-06-12 MED ORDER — 0.9 % SODIUM CHLORIDE (POUR BTL) OPTIME
TOPICAL | Status: DC | PRN
  Administered 2021-06-12: 2000 mL

## 2021-06-12 MED ORDER — METRONIDAZOLE 500 MG PO TABS: 1000.0000 mg | ORAL_TABLET | ORAL | Status: DC

## 2021-06-12 MED ORDER — SODIUM CHLORIDE 0.9 % IV SOLN
2.0000 g | Freq: Two times a day (BID) | INTRAVENOUS | Status: AC
  Administered 2021-06-12: 2 g via INTRAVENOUS
  Filled 2021-06-12: qty 2

## 2021-06-12 MED ORDER — PROPOFOL 10 MG/ML IV BOLUS
INTRAVENOUS | Status: DC | PRN
  Administered 2021-06-12: 150 mg via INTRAVENOUS

## 2021-06-12 MED ORDER — LACTATED RINGERS IV SOLN: INTRAVENOUS | Status: DC | PRN

## 2021-06-12 MED ORDER — BISACODYL 5 MG PO TBEC: 20.0000 mg | DELAYED_RELEASE_TABLET | Freq: Once | ORAL | Status: DC

## 2021-06-12 MED ORDER — SIMETHICONE 80 MG PO CHEW
40.0000 mg | CHEWABLE_TABLET | Freq: Four times a day (QID) | ORAL | Status: DC | PRN
  Administered 2021-06-12 – 2021-06-13 (×2): 40 mg via ORAL
  Filled 2021-06-12 (×2): qty 1

## 2021-06-12 MED ORDER — DIPHENHYDRAMINE HCL 50 MG/ML IJ SOLN
12.5000 mg | Freq: Four times a day (QID) | INTRAMUSCULAR | Status: DC | PRN
  Administered 2021-06-13: 12.5 mg via INTRAVENOUS
  Filled 2021-06-12: qty 1

## 2021-06-12 MED ORDER — ADULT MULTIVITAMIN W/MINERALS CH
1.0000 | ORAL_TABLET | Freq: Every day | ORAL | Status: DC
  Administered 2021-06-13 – 2021-06-14 (×2): 1 via ORAL
  Filled 2021-06-12 (×2): qty 1

## 2021-06-12 MED ORDER — TESTOSTERONE CYPIONATE 200 MG/ML IJ SOLN: 100.0000 [IU] | INTRAMUSCULAR | Status: DC

## 2021-06-12 MED ORDER — OLOPATADINE HCL 0.1 % OP SOLN: 1.0000 [drp] | Freq: Two times a day (BID) | OPHTHALMIC | Status: DC | PRN

## 2021-06-12 MED ORDER — ROCURONIUM BROMIDE 10 MG/ML (PF) SYRINGE
PREFILLED_SYRINGE | INTRAVENOUS | Status: DC | PRN
  Administered 2021-06-12: 60 mg via INTRAVENOUS
  Administered 2021-06-12 (×3): 20 mg via INTRAVENOUS
  Administered 2021-06-12: 30 mg via INTRAVENOUS

## 2021-06-12 SURGICAL SUPPLY — 127 items
ADAPTER GOLDBERG URETERAL (ADAPTER) IMPLANT
ADPR CATH 15X14FR FL DRN BG (ADAPTER)
APL PRP STRL LF DISP 70% ISPRP (MISCELLANEOUS)
APPLIER CLIP 5 13 M/L LIGAMAX5 (MISCELLANEOUS)
APPLIER CLIP ROT 10 11.4 M/L (STAPLE)
APR CLP MED LRG 11.4X10 (STAPLE)
APR CLP MED LRG 5 ANG JAW (MISCELLANEOUS)
BAG COUNTER SPONGE SURGICOUNT (BAG) ×3 IMPLANT
BAG SPNG CNTER NS LX DISP (BAG) ×2
BAG URO CATCHER STRL LF (MISCELLANEOUS) ×3 IMPLANT
BLADE EXTENDED COATED 6.5IN (ELECTRODE) IMPLANT
CANNULA REDUC XI 12-8 STAPL (CANNULA)
CANNULA REDUCER 12-8 DVNC XI (CANNULA) IMPLANT
CATH URET 5FR 28IN OPEN ENDED (CATHETERS) IMPLANT
CELLS DAT CNTRL 66122 CELL SVR (MISCELLANEOUS) IMPLANT
CHLORAPREP W/TINT 26 (MISCELLANEOUS) IMPLANT
CLIP APPLIE 5 13 M/L LIGAMAX5 (MISCELLANEOUS) IMPLANT
CLIP APPLIE ROT 10 11.4 M/L (STAPLE) IMPLANT
CLOTH BEACON ORANGE TIMEOUT ST (SAFETY) ×3 IMPLANT
COVER SURGICAL LIGHT HANDLE (MISCELLANEOUS) ×6 IMPLANT
COVER TIP SHEARS 8 DVNC (MISCELLANEOUS) ×2 IMPLANT
COVER TIP SHEARS 8MM DA VINCI (MISCELLANEOUS) ×3
DECANTER SPIKE VIAL GLASS SM (MISCELLANEOUS) ×3 IMPLANT
DEVICE TROCAR PUNCTURE CLOSURE (ENDOMECHANICALS) IMPLANT
DRAIN CHANNEL 19F RND (DRAIN) ×1 IMPLANT
DRAPE ARM DVNC X/XI (DISPOSABLE) ×8 IMPLANT
DRAPE COLUMN DVNC XI (DISPOSABLE) ×2 IMPLANT
DRAPE DA VINCI XI ARM (DISPOSABLE) ×12
DRAPE DA VINCI XI COLUMN (DISPOSABLE) ×3
DRAPE SURG IRRIG POUCH 19X23 (DRAPES) ×3 IMPLANT
DRSG OPSITE POSTOP 4X10 (GAUZE/BANDAGES/DRESSINGS) IMPLANT
DRSG OPSITE POSTOP 4X6 (GAUZE/BANDAGES/DRESSINGS) ×1 IMPLANT
DRSG OPSITE POSTOP 4X8 (GAUZE/BANDAGES/DRESSINGS) IMPLANT
DRSG TEGADERM 2-3/8X2-3/4 SM (GAUZE/BANDAGES/DRESSINGS) ×15 IMPLANT
DRSG TEGADERM 4X4.75 (GAUZE/BANDAGES/DRESSINGS) IMPLANT
ELECT PENCIL ROCKER SW 15FT (MISCELLANEOUS) ×3 IMPLANT
ELECT REM PT RETURN 15FT ADLT (MISCELLANEOUS) ×3 IMPLANT
ENDOLOOP SUT PDS II  0 18 (SUTURE)
ENDOLOOP SUT PDS II 0 18 (SUTURE) IMPLANT
EVACUATOR SILICONE 100CC (DRAIN) ×1 IMPLANT
GAUZE SPONGE 2X2 8PLY STRL LF (GAUZE/BANDAGES/DRESSINGS) ×2 IMPLANT
GLOVE SURG ENC TEXT LTX SZ7.5 (GLOVE) ×3 IMPLANT
GLOVE SURG NEOPR MICRO LF SZ8 (GLOVE) ×9 IMPLANT
GLOVE SURG UNDER LTX SZ8 (GLOVE) ×9 IMPLANT
GOWN STRL REUS W/TWL LRG LVL3 (GOWN DISPOSABLE) ×3 IMPLANT
GOWN STRL REUS W/TWL XL LVL3 (GOWN DISPOSABLE) ×9 IMPLANT
GRASPER SUT TROCAR 14GX15 (MISCELLANEOUS) IMPLANT
GUIDEWIRE ANG ZIPWIRE 038X150 (WIRE) IMPLANT
GUIDEWIRE STR DUAL SENSOR (WIRE) IMPLANT
HOLDER FOLEY CATH W/STRAP (MISCELLANEOUS) ×3 IMPLANT
IRRIG SUCT STRYKERFLOW 2 WTIP (MISCELLANEOUS) ×3
IRRIGATION SUCT STRKRFLW 2 WTP (MISCELLANEOUS) ×2 IMPLANT
KIT PROCEDURE DA VINCI SI (MISCELLANEOUS) ×3
KIT PROCEDURE DVNC SI (MISCELLANEOUS) ×2 IMPLANT
KIT SIGMOIDOSCOPE (SET/KITS/TRAYS/PACK) IMPLANT
KIT TURNOVER KIT A (KITS) ×6 IMPLANT
MANIFOLD NEPTUNE II (INSTRUMENTS) ×3 IMPLANT
NDL INSUFFLATION 14GA 120MM (NEEDLE) ×2 IMPLANT
NEEDLE INSUFFLATION 14GA 120MM (NEEDLE) ×3 IMPLANT
PACK CARDIOVASCULAR III (CUSTOM PROCEDURE TRAY) ×3 IMPLANT
PACK COLON (CUSTOM PROCEDURE TRAY) ×3 IMPLANT
PACK CYSTO (CUSTOM PROCEDURE TRAY) ×3 IMPLANT
PAD POSITIONING PINK XL (MISCELLANEOUS) ×3 IMPLANT
PROTECTOR NERVE ULNAR (MISCELLANEOUS) ×6 IMPLANT
RELOAD STAPLE 45 3.5 BLU DVNC (STAPLE) IMPLANT
RELOAD STAPLE 45 4.3 GRN DVNC (STAPLE) IMPLANT
RELOAD STAPLE 60 3.5 BLU DVNC (STAPLE) IMPLANT
RELOAD STAPLE 60 4.3 GRN DVNC (STAPLE) IMPLANT
RELOAD STAPLER 3.5X45 BLU DVNC (STAPLE) IMPLANT
RELOAD STAPLER 3.5X60 BLU DVNC (STAPLE) IMPLANT
RELOAD STAPLER 4.3X45 GRN DVNC (STAPLE) IMPLANT
RELOAD STAPLER 4.3X60 GRN DVNC (STAPLE) ×4 IMPLANT
RETRACTOR WND ALEXIS 18 MED (MISCELLANEOUS) IMPLANT
RTRCTR WOUND ALEXIS 18CM MED (MISCELLANEOUS)
SCISSORS LAP 5X35 DISP (ENDOMECHANICALS) ×3 IMPLANT
SEAL CANN UNIV 5-8 DVNC XI (MISCELLANEOUS) ×6 IMPLANT
SEAL XI 5MM-8MM UNIVERSAL (MISCELLANEOUS) ×9
SEALER VESSEL DA VINCI XI (MISCELLANEOUS) ×3
SEALER VESSEL EXT DVNC XI (MISCELLANEOUS) ×2 IMPLANT
SOLUTION ELECTROLUBE (MISCELLANEOUS) ×3 IMPLANT
SPONGE GAUZE 2X2 STER 10/PKG (GAUZE/BANDAGES/DRESSINGS) ×1
STAPLER 45 DA VINCI SURE FORM (STAPLE)
STAPLER 45 SUREFORM DVNC (STAPLE) IMPLANT
STAPLER 60 DA VINCI SURE FORM (STAPLE) ×3
STAPLER 60 SUREFORM DVNC (STAPLE) IMPLANT
STAPLER CANNULA SEAL DVNC XI (STAPLE) ×2 IMPLANT
STAPLER CANNULA SEAL XI (STAPLE) ×3
STAPLER ECHELON POWER CIR 29 (STAPLE) IMPLANT
STAPLER ECHELON POWER CIR 31 (STAPLE) IMPLANT
STAPLER RELOAD 3.5X45 BLU DVNC (STAPLE)
STAPLER RELOAD 3.5X45 BLUE (STAPLE)
STAPLER RELOAD 3.5X60 BLU DVNC (STAPLE)
STAPLER RELOAD 3.5X60 BLUE (STAPLE)
STAPLER RELOAD 4.3X45 GREEN (STAPLE)
STAPLER RELOAD 4.3X45 GRN DVNC (STAPLE)
STAPLER RELOAD 4.3X60 GREEN (STAPLE) ×6
STAPLER RELOAD 4.3X60 GRN DVNC (STAPLE) ×4
STOPCOCK 4 WAY LG BORE MALE ST (IV SETS) ×6 IMPLANT
SURGILUBE 2OZ TUBE FLIPTOP (MISCELLANEOUS) IMPLANT
SUT MNCRL AB 4-0 PS2 18 (SUTURE) ×3 IMPLANT
SUT PDS AB 1 CT1 27 (SUTURE) ×6 IMPLANT
SUT PROLENE 0 CT 2 (SUTURE) IMPLANT
SUT PROLENE 2 0 KS (SUTURE) IMPLANT
SUT PROLENE 2 0 SH DA (SUTURE) ×1 IMPLANT
SUT SILK 2 0 (SUTURE)
SUT SILK 2 0 SH CR/8 (SUTURE) IMPLANT
SUT SILK 2-0 18XBRD TIE 12 (SUTURE) IMPLANT
SUT SILK 3 0 (SUTURE)
SUT SILK 3 0 SH CR/8 (SUTURE) ×3 IMPLANT
SUT SILK 3-0 18XBRD TIE 12 (SUTURE) IMPLANT
SUT V-LOC BARB 180 2/0GR6 GS22 (SUTURE)
SUT VIC AB 3-0 SH 18 (SUTURE) IMPLANT
SUT VIC AB 3-0 SH 27 (SUTURE)
SUT VIC AB 3-0 SH 27XBRD (SUTURE) IMPLANT
SUT VICRYL 0 UR6 27IN ABS (SUTURE) ×3 IMPLANT
SUTURE V-LC BRB 180 2/0GR6GS22 (SUTURE) IMPLANT
SYR 10ML ECCENTRIC (SYRINGE) ×3 IMPLANT
SYS LAPSCP GELPORT 120MM (MISCELLANEOUS)
SYS WOUND ALEXIS 18CM MED (MISCELLANEOUS) ×3
SYSTEM LAPSCP GELPORT 120MM (MISCELLANEOUS) IMPLANT
SYSTEM WOUND ALEXIS 18CM MED (MISCELLANEOUS) ×2 IMPLANT
TOWEL OR NON WOVEN STRL DISP B (DISPOSABLE) ×3 IMPLANT
TRAY FOLEY MTR SLVR 16FR STAT (SET/KITS/TRAYS/PACK) ×3 IMPLANT
TROCAR ADV FIXATION 5X100MM (TROCAR) ×3 IMPLANT
TUBING CONNECTING 10 (TUBING) ×9 IMPLANT
TUBING INSUFFLATION 10FT LAP (TUBING) ×3 IMPLANT
TUBING UROLOGY SET (TUBING) IMPLANT

## 2021-06-12 NOTE — Interval H&P Note (Signed)
History and Physical Interval Note:  06/12/2021 10:50 AM  Grant [name]  has presented today for surgery, with the diagnosis of STRICTURE OF COLON.  The various methods of treatment have been discussed with the patient and family. After consideration of risks, benefits and other options for treatment, the patient has consented to  Procedure(s): ROBOTIC RESECTION OF COLON SIGMOID (N/A) RIGID PROCTOSCOPY (N/A) CYSTOSCOPY with FIREFLY INJECTION (N/A) as a surgical intervention.  The patient's history has been reviewed, patient examined, no change in status, stable for surgery.  I have reviewed the patient's chart and labs.  Questions were answered to the patient's satisfaction.    I have re-reviewed the the patient's records, history, medications, and allergies.  I have re-examined the patient.  I again discussed intraoperative plans and goals of post-operative recovery.  The patient agrees to proceed.  Furman Obarr [facility]  [birth year] SO:[id number]  Patient Care Team: Shirline Frees, MD as PCP - General (Family Medicine) Josue Hector, MD as PCP - Cardiology (Cardiology) Cameron Sprang, MD as Consulting Physician (Neurology) Lavonna Monarch, MD as Consulting Physician (Dermatology) [name], MD as Consulting Physician (Colon and Rectal Surgery) Clarene Essex, MD as Consulting Physician (Gastroenterology)  Patient Active Problem List   Diagnosis Date Noted   Anxiety 03/19/2021   Diverticulitis of colon 03/19/2021   Gout 03/19/2021   Hardening of the aorta (main artery of the heart) ([place]) 03/19/2021   Hypercoagulable state ([place]) 03/19/2021   Iron deficiency anemia 03/19/2021   Migraine 03/19/2021   Mixed hyperlipidemia 03/19/2021   Personal history of colonic polyps 03/19/2021   Recurrent major depression in remission ([place]) 03/19/2021   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   Acute diverticulitis 10/10/2020   Bilateral shoulder pain 10/26/2019   Morbid obesity due to excess calories  ([place]) complicated by HBP, OSA/ gerd  07/07/2019   Upper airway cough syndrome 03/09/2019   Confusion with non-focal neuro exam 02/16/2019   Cough variant asthma vs UACS 11/15/2018   Cough 10/27/2018   Angioedema 03/07/2018   Sepsis ([place]) 03/07/2018   Asthma 03/07/2018   Hypothyroidism 03/07/2018   OSA on CPAP 03/07/2018   BPH (benign prostatic hyperplasia) 03/07/2018   Pain in joint of left shoulder 01/26/2018   Allergic reaction 01/21/2018   Atrial fibrillation with RVR ([place]) 07/28/2017   S/P AVR (aortic valve replacement) and aortoplasty 07/02/2017   Eczema 01/01/2014   Hoarseness or changing voice 07/24/2013   PAF (paroxysmal atrial fibrillation) ([place]) 05/18/2013   Chronic anticoagulation 05/18/2013   Preop cardiovascular exam 05/18/2013   Macular rash 08/10/2012   Acute asthmatic bronchitis 05/05/2012   Essential hypertension 06/17/2010   SYNCOPE 06/14/2010   DEPRESSION 02/11/2008   Obstructive sleep apnea 02/11/2008   Seasonal and perennial allergic rhinitis 02/11/2008   Chronic bronchitis ([place]) 02/11/2008   GERD (gastroesophageal reflux disease) 02/11/2008    Past Medical History:  Diagnosis Date   Allergic rhinitis    Anxiety    Arthritis    Asthma    Bladder tumor    Chronic fatigue    COVID-19 05/22/2021   Depression    06/30/17 Pt denies being depressed, reports Effexor is taken for Chronic Fatigue    Dyspnea    Enlarged prostate    Fibromyalgia    GERD (gastroesophageal reflux disease)    Headache    History of chronic bronchitis    History of migraine    History of toxic encephalopathy    Hypothyroidism    OSA on CPAP    CPAP  14   PAF (paroxysmal atrial fibrillation) (HCC) CARDIOLOGIST -- DR [name]   DX OCT 2013   S/P AVR (aortic valve replacement) and aortoplasty    Sensitiveness to light    Unspecified essential hypertension    Urethral tumor    PROSTATIC    Past Surgical History:  Procedure Laterality Date   BENTALL PROCEDURE N/A  07/02/2017   Procedure: BENTALL PROCEDURE;  Surgeon: Ivin Poot, MD;  Location: [facility];  Service: Open Heart Surgery;  Laterality: N/A;  WITH CIRC ARREST   BIOPSY  02/06/2021   Procedure: BIOPSY;  Surgeon: Clarene Essex, MD;  Location: [facility];  Service: Endoscopy;;   COLONOSCOPY     COLONOSCOPY WITH PROPOFOL N/A 02/06/2021   Procedure: COLONOSCOPY WITH PROPOFOL;  Surgeon: Clarene Essex, MD;  Location: [facility];  Service: Endoscopy;  Laterality: N/A;   CYSTOSCOPY W/ RETROGRADES Bilateral 06/15/2013   Procedure: CYSTOSCOPY WITH BILATERAL RETROGRADE PYELOGRAM  BLADDER BIOPSY, PROSTATIC URETHRAL BIOPSY, ;  Surgeon: Molli Hazard, MD;  Location: [facility];  Service: Urology;  Laterality: Bilateral;   ESOPHAGOGASTRODUODENOSCOPY (EGD) WITH PROPOFOL N/A 03/11/2018   Procedure: ESOPHAGOGASTRODUODENOSCOPY (EGD) WITH PROPOFOL;  Surgeon: Clarene Essex, MD;  Location: [facility];  Service: Endoscopy;  Laterality: N/A;   LAPAROSCOPIC CHOLECYSTECTOMY  01/14/2001   RIGHT/LEFT HEART CATH AND CORONARY ANGIOGRAPHY N/A 06/12/2017   Procedure: RIGHT/LEFT HEART CATH AND CORONARY ANGIOGRAPHY;  Surgeon: Larey Dresser, MD;  Location: [facility];  Service: Cardiovascular;  Laterality: N/A;   TEE WITHOUT CARDIOVERSION N/A 07/02/2017   Procedure: TRANSESOPHAGEAL ECHOCARDIOGRAM (TEE);  Surgeon: Prescott Gum, Collier Salina, MD;  Location: [facility];  Service: Open Heart Surgery;  Laterality: N/A;   UMBILICAL HERNIA REPAIR  01/14/2008    Social History   Socioeconomic History   Marital status: Married    Spouse name: Not on file   Number of children: Not on file   Years of education: Not on file   Highest education level: Not on file  Occupational History   Occupation: disabled    Comment: owner of bu[employer]  Tobacco Use   Smoking status: Former    Packs/day: 0.50    Years: 27.00    Pack years: 13.50    Types: Cigarettes    Start date: 1970    Quit date: 10/21/1979    Years since  quitting: 41.6   Smokeless tobacco: Never  Vaping Use   Vaping Use: Never used  Substance and Sexual Activity   Alcohol use: Yes    Alcohol/week: 4.0 standard drinks    Types: 4 Cans of beer per week    Comment: drinks "as much as I could" - 2-3 beers up to 8-10   Drug use: Never   Sexual activity: Not on file  Other Topics Concern   Not on file  Social History Narrative   Right handed   One story home   Drinks no caffeine   Social Determinants of Health   Financial Resource Strain: Not on file  Food Insecurity: Not on file  Transportation Needs: Not on file  Physical Activity: Not on file  Stress: Not on file  Social Connections: Not on file  Intimate Partner Violence: Not on file    Family History  Problem Relation Age of Onset   Aortic aneurysm Mother 12       cause of death   Other Father        motor vehicle accident   Heart disease Other  family history    Medications Prior to Admission  Medication Sig Dispense Refill Last Dose   albuterol (VENTOLIN HFA) 108 (90 Base) MCG/ACT inhaler INHALE 2 PUFFS INTO THE LUNGS EVERY 6 HOURS AS NEEDED FOR WHEEZING OR SHORTNESS OF BREATH (Patient taking differently: Inhale 2 puffs into the lungs every 6 (six) hours as needed for shortness of breath.) 18 g 5 06/12/2021 at 0930   ALPRAZolam (XANAX) 0.25 MG tablet Take 1 tablet (0.25 mg total) by mouth 3 (three) times daily as needed for anxiety. 15 tablet 0 06/11/2021 at 1849   aluminum hydroxide-magnesium carbonate (GAVISCON) 95-358 MG/15ML SUSP Take 15 mLs by mouth as needed for indigestion or heartburn.   06/08/2021 at 1900   benzonatate (TESSALON) 200 MG capsule Take 1 capsule (200 mg total) by mouth 3 (three) times daily as needed for cough. 60 capsule 3 06/09/2021   budesonide-formoterol (SYMBICORT) 160-4.5 MCG/ACT inhaler Inhale 2 puffs into the lungs in the morning and at bedtime. 1 each 5 06/12/2021 at 0800   calcium carbonate (TUMS - DOSED IN MG ELEMENTAL CALCIUM) 500 MG  chewable tablet Chew 500-1,000 mg by mouth daily as needed for indigestion or heartburn.   05/23/2021   chlorpheniramine (CHLOR-TRIMETON) 4 MG tablet Take 4 mg by mouth every 6 (six) hours as needed for allergies.   06/11/2021 at 0800   COLCRYS 0.6 MG tablet Take 0.6 mg by mouth in the morning.   06/11/2021 at 0800   Dextromethorphan-guaiFENesin (MUCINEX DM MAXIMUM STRENGTH) 60-1200 MG TB12 Take 1 tablet by mouth 2 (two) times daily as needed (congestion/cough).   06/10/2021   diclofenac Sodium (VOLTAREN) 1 % GEL Apply 2 g topically daily as needed (pain).   06/11/2021 at 2000   diphenhydrAMINE (BENADRYL) 25 MG tablet Take 50 mg by mouth daily as needed for allergies.   06/08/2021 at 1400   EPINEPHrine 0.3 mg/0.3 mL IJ SOAJ injection Inject 0.3 mLs (0.3 mg total) into the muscle as needed (for angioedema). 2 Device 0    Erenumab-aooe (AIMOVIG) 140 MG/ML SOAJ Inject 140 mg into the skin every 28 (twenty-eight) days. 1 mL 11 06/10/2021   Ferrous Sulfate (IRON) 142 (45 Fe) MG TBCR Take 45 mg by mouth in the morning and at bedtime.   06/11/2021 at 1830   finasteride (PROSCAR) 5 MG tablet Take 5 mg by mouth at bedtime.    06/11/2021 at 1830   levothyroxine (SYNTHROID, LEVOTHROID) 112 MCG tablet Take 112 mcg by mouth daily before breakfast.    06/12/2021 at 0800   liothyronine (CYTOMEL) 25 MCG tablet Take 25 mcg by mouth daily before breakfast.   06/12/2021   losartan (COZAAR) 50 MG tablet TAKE 1 TABLET BY MOUTH EVERY DAY (Patient taking differently: Take 50 mg by mouth daily.) 30 tablet 6 06/12/2021 at 0800   magnesium oxide (MAG-OX) 400 MG tablet Take 400 mg by mouth daily.   06/10/2021 at 0800   Menthol, Topical Analgesic, (BLUE-EMU MAXIMUM STRENGTH EX) Apply 1 application topically as needed (pain).   06/11/2021 at 2000   Methylcellulose, Laxative, (FIBER THERAPY) 500 MG TABS Take 500-1,000 mg by mouth daily.   06/09/2021 at 0800   metoprolol tartrate (LOPRESSOR) 50 MG tablet Take 1 tablet (50 mg total) by mouth 2  (two) times daily. 180 tablet 3 06/12/2021 at 0800   Multiple Vitamin (MULTIVITAMIN WITH MINERALS) TABS tablet Take 1 tablet by mouth daily.   06/10/2021 at 0800   NON FORMULARY Pt uses a cpap nightly   06/12/2021  olopatadine (PATANOL) 0.1 % ophthalmic solution Place 1 drop into both eyes 2 (two) times daily as needed for allergies.   06/11/2021 at 1600   omeprazole (PRILOSEC) 40 MG capsule Take 1 capsule (40 mg total) by mouth 2 (two) times daily. 180 capsule 1 06/12/2021 at 0800   rizatriptan (MAXALT) 10 MG tablet Take 1 tablet (10 mg total) by mouth as needed for migraine. May repeat in 2 hours if needed 10 tablet 11 06/09/2021 at 1400   saccharomyces boulardii (FLORASTOR) 250 MG capsule Take 250 mg by mouth daily as needed (when taking antibiotics).   06/10/2021 at 0800   sodium chloride (OCEAN) 0.65 % SOLN nasal spray Place 1 spray into both nostrils as needed for congestion.   06/10/2021 at 0830   tamsulosin (FLOMAX) 0.4 MG CAPS capsule Take 0.4 mg by mouth daily after supper.   06/12/2021 at 0800   Testosterone Cypionate 200 MG/ML SOLN Inject 100 Units into the muscle every Tuesday.   06/08/2021   venlafaxine (EFFEXOR-XR) 150 MG 24 hr capsule Take 150 mg by mouth daily with breakfast.   06/12/2021 at 0800   amoxicillin (AMOXIL) 500 MG capsule Take 4 capsules (2,000 mg total) by mouth See admin instructions. 1 hour prior to dental procedure 8 capsule 5 More than a month   azelastine (ASTELIN) 137 MCG/SPRAY nasal spray Place 1-2 sprays into the nose 2 (two) times daily as needed for rhinitis or allergies.    More than a month   clobetasol ointment (TEMOVATE) [id number] % Apply 1 application topically 2 (two) times daily as needed (rash).   More than a month   rivaroxaban (XARELTO) 20 MG TABS tablet Take 1 tablet (20 mg total) by mouth daily with supper. 90 tablet 3 06/08/2021 at 1900    Current Facility-Administered Medications  Medication Dose Route Frequency Provider Last Rate Last Admin   acetaminophen  (TYLENOL) tablet 1,000 mg  1,000 mg Oral On Call to OR [name], MD       alvimopan (ENTEREG) capsule 12 mg  12 mg Oral On Call to OR [name], MD       bupivacaine liposome (EXPAREL) 1.3 % injection 266 mg  20 mL Infiltration Once [name], MD       cefoTEtan (CEFOTAN) 2 g in sodium chloride 0.9 % 100 mL IVPB  2 g Intravenous On Call to OR [name], MD       chlorhexidine (PERIDEX) 0.12 % solution 15 mL  15 mL Mouth/Throat Once Suzette Battiest, MD       Or   MEDLINE mouth rinse  15 mL Mouth Rinse Once Suzette Battiest, MD       enoxaparin (LOVENOX) injection 40 mg  40 mg Subcutaneous Once [name], MD       Derrill Memo ON 06/13/2021] feeding supplement (ENSURE PRE-SURGERY) liquid 296 mL  296 mL Oral Once [name], MD       feeding supplement (ENSURE PRE-SURGERY) liquid 592 mL  592 mL Oral Once [name], MD       gabapentin (NEURONTIN) capsule 300 mg  300 mg Oral On Call to OR [name], MD       lactated ringers infusion   Intravenous Continuous Suzette Battiest, MD 10 mL/hr at 06/12/21 1042 New Bag at 06/12/21 1042     Allergies  Allergen Reactions   Perphenazine Swelling    Tongue swelling   Amiodarone Swelling   Codeine Itching   Tramadol Other (See Comments)    dellusion  BP (!) 142/79   Pulse (!) 56   Temp 97.7 F (36.5 C) (Oral)   Resp 15   SpO2 99%   Labs: No results found for this or any previous visit (from the past 48 hour(s)).  Imaging / Studies: DG Chest 2 View  Result Date: 05/22/2021 CLINICAL DATA:  Cough. EXAM: CHEST - 2 VIEW COMPARISON:  Chest x-ray 01/22/2021 10/10/2020. CT chest 10/10/2020. FINDINGS: Mediastinum and hilar structures normal. Prior CABG and cardiac valve replacement. Stable cardiomegaly. No pulmonary venous congestion. Chronic interstitial changes again noted. No acute infiltrate. Slight increased density noted over the left chest on PA view most likely related to overlying soft tissues. Degenerative  change thoracic spine. IMPRESSION: 1.  Prior CABG and cardiac valve replacement.  Stable cardiomegaly. 2.  Chronic interstitial changes again noted.  No acute infiltrate. Electronically Signed   By: Marcello Moores  Register   On: 05/22/2021 06:40     .Adin Hector, M.D., F.A.C.S. Gastrointestinal and Minimally Invasive Surgery [facility] [address] [place], [place] [address] [phone] Main / Paging  06/12/2021 10:50 AM    Adin Hector

## 2021-06-12 NOTE — Op Note (Signed)
Preoperative diagnosis:   Diverticulitis Postoperative diagnosis:  Same   Procedure: Cystoscopy Instillation of ureteral firefly constrast   Surgeon: Ardis Hughs, MD   Anesthesia: General   Complications: None   Intraoperative findings: enlarged prostate with J-hooked ureters.   EBL: Minimal   Specimens: None   Indication:  Grant [name]  is a 70 y.o.  patient with h/o diverticulitis.  Dr. Johney Maine requested instillation of firefly to help facilitate dissection of the sigmoid colon.  After reviewing the management options for treatment, he elected to proceed with the above surgical procedure(s). We have discussed the potential benefits and risks of the procedure, side effects of the proposed treatment, the likelihood of the patient achieving the goals of the procedure, and any potential problems that might occur during the procedure or recuperation. Informed consent has been obtained.   Description of procedure:   The patient was taken to the operating room and general anesthesia was induced.  The patient was placed in the dorsal lithotomy position, prepped and draped in the usual sterile fashion, and preoperative antibiotics were administered. A preoperative time-out was performed.    A 21 French 30 degree cystoscope was gently passed through the patient's urethra into the bladder.  The bladder was subsequently emptied and then filled slowly up performing a 360 degrees cystoscopic evaluation.  This demonstrated orthotopic ureteral orifices, normal bladder mucosa with an area of heaped mucosa and bullous edema in the dome -  evidence of colovesical fistula without mucosal abnormality.   I then advanced a 5 [place] open-ended ureteral catheter into the patient's left ureteral orifice and  I then advanced the catheter up into the proximal ureter and then slowly pulled back and injected 7.33m of the firefly contrast.  Subsequently turned my attention to the patient's right ureteral  orifice and performed a similar task.   I then  placed a 16 [place].    The surgery was then turned over to Dr. GJohney Mainefor facilitation of the remainder of the case.

## 2021-06-12 NOTE — Discharge Instructions (Signed)
SURGERY: POST OP INSTRUCTIONS (Surgery for small bowel obstruction, colon resection, etc)   ######################################################################  EAT Gradually transition to a high fiber diet with a fiber supplement over the next few days after discharge  WALK Walk an hour a day.  Control your pain to do that.    CONTROL PAIN Control pain so that you can walk, sleep, tolerate sneezing/coughing, go up/down stairs.  HAVE A BOWEL MOVEMENT DAILY Keep your bowels regular to avoid problems.  OK to try a laxative to override constipation.  OK to use an antidairrheal to slow down diarrhea.  Call if not better after 2 tries  CALL IF YOU HAVE PROBLEMS/CONCERNS Call if you are still struggling despite following these instructions. Call if you have concerns not answered by these instructions  ######################################################################   DIET Follow a light diet the first few days at home.  Start with a bland diet such as soups, liquids, starchy foods, low fat foods, etc.  If you feel full, bloated, or constipated, stay on a ful liquid or pureed/blenderized diet for a few days until you feel better and no longer constipated. Be sure to drink plenty of fluids every day to avoid getting dehydrated (feeling dizzy, not urinating, etc.). Gradually add a fiber supplement to your diet over the next week.  Gradually get back to a regular solid diet.  Avoid fast food or heavy meals the first week as you are more likely to get nauseated. It is expected for your digestive tract to need a few months to get back to normal.  It is common for your bowel movements and stools to be irregular.  You will have occasional bloating and cramping that should eventually fade away.  Until you are eating solid food normally, off all pain medications, and back to regular activities; your bowels will not be normal. Focus on eating a low-fat, high fiber diet the rest of your life  (See Getting to [place], below).  CARE of your INCISION or WOUND It is good for closed incision and even open wounds to be washed every day.  Shower every day.  Short baths are fine.  Wash the incisions and wounds clean with soap & water.     If you have a closed incision(s), wash the incision with soap & water every day.  You may leave closed incisions open to air if it is dry.   You may cover the incision with clean gauze & replace it after your daily shower for comfort.  It is good for closed incisions and even open wounds to be washed every day.  Shower every day.  Short baths are fine.  Wash the incisions and wounds clean with soap & water.    You may leave closed incisions open to air if it is dry.   You may cover the incision with clean gauze & replace it after your daily shower for comfort.  TEGADERM:  You have clear gauze band-aid dressings over your closed incision(s).  Remove the dressings 3 days after surgery.= Saturday 8/27   If you have an open wound with a wound vac, see wound vac care instructions.     ACTIVITIES as tolerated Start light daily activities --- self-care, walking, climbing stairs-- beginning the day after surgery.  Gradually increase activities as tolerated.  Control your pain to be active.  Stop when you are tired.  Ideally, walk several times a day, eventually an hour a day.   Most people are back to most day-to-day  activities in a few weeks.  It takes 4-8 weeks to get back to unrestricted, intense activity. If you can walk 30 minutes without difficulty, it is safe to try more intense activity such as jogging, treadmill, bicycling, low-impact aerobics, swimming, etc. Save the most intensive and strenuous activity for last (Usually 4-8 weeks after surgery) such as sit-ups, heavy lifting, contact sports, etc.  Refrain from any intense heavy lifting or straining until you are off narcotics for pain control.  You will have off days, but things should  improve week-by-week. DO NOT PUSH THROUGH PAIN.  Let pain be your guide: If it hurts to do something, don't do it.  Pain is your body warning you to avoid that activity for another week until the pain goes down. You may drive when you are no longer taking narcotic prescription pain medication, you can comfortably wear a seatbelt, and you can safely make sudden turns/stops to protect yourself without hesitating due to pain. You may have sexual intercourse when it is comfortable. If it hurts to do something, stop.  MEDICATIONS Take your usually prescribed home medications unless otherwise directed.   Blood thinners:  Usually you can restart any strong blood thinners after the second postoperative day.  It is OK to take aspirin right away.     If you are on strong blood thinners (warfarin/Coumadin, Plavix, Xerelto, Eliquis, Pradaxa, etc), discuss with your surgeon, medicine PCP, and/or cardiologist for instructions on when to restart the blood thinner & if blood monitoring is needed (PT/INR blood check, etc).     PAIN CONTROL Pain after surgery or related to activity is often due to strain/injury to muscle, tendon, nerves and/or incisions.  This pain is usually short-term and will improve in a few months.  To help speed the process of healing and to get back to regular activity more quickly, DO THE FOLLOWING THINGS TOGETHER: Increase activity gradually.  DO NOT PUSH THROUGH PAIN Use Ice and/or Heat Try Gentle Massage and/or Stretching Take over the counter pain medication Take Narcotic prescription pain medication for more severe pain  Good pain control = faster recovery.  It is better to take more medicine to be more active than to stay in bed all day to avoid medications.  Increase activity gradually Avoid heavy lifting at first, then increase to lifting as tolerated over the next 6 weeks. Do not "push through" the pain.  Listen to your body and avoid positions and maneuvers than reproduce the  pain.  Wait a few days before trying something more intense Walking an hour a day is encouraged to help your body recover faster and more safely.  Start slowly and stop when getting sore.  If you can walk 30 minutes without stopping or pain, you can try more intense activity (running, jogging, aerobics, cycling, swimming, treadmill, sex, sports, weightlifting, etc.) Remember: If it hurts to do it, then don't do it! Use Ice and/or Heat You will have swelling and bruising around the incisions.  This will take several weeks to resolve. Ice packs or heating pads (6-8 times a day, 30-60 minutes at a time) will help sooth soreness & bruising. Some people prefer to use ice alone, heat alone, or alternate between ice & heat.  Experiment and see what works best for you.  Consider trying ice for the first few days to help decrease swelling and bruising; then, switch to heat to help relax sore spots and speed recovery. Shower every day.  Short baths are fine.  It  feels good!  Keep the incisions and wounds clean with soap & water.   Try Gentle Massage and/or Stretching Massage at the area of pain many times a day Stop if you feel pain - do not overdo it Take over the counter pain medication This helps the muscle and nerve tissues become less irritable and calm down faster Choose ONE of the following over-the-counter anti-inflammatory medications: Acetaminophen 500mg  tabs (Tylenol) 1-2 pills with every meal and just before bedtime (avoid if you have liver problems or if you have acetaminophen in you narcotic prescription) Naproxen 220mg  tabs (ex. Aleve, Naprosyn) 1-2 pills twice a day (avoid if you have kidney, stomach, IBD, or bleeding problems) Ibuprofen 200mg  tabs (ex. Advil, Motrin) 3-4 pills with every meal and just before bedtime (avoid if you have kidney, stomach, IBD, or bleeding problems) Take with food/snack several times a day as directed for at least 2 weeks to help keep pain / soreness down & more  manageable. Take Narcotic prescription pain medication for more severe pain A prescription for strong pain control is often given to you upon discharge (for example: oxycodone/Percocet, hydrocodone/Norco/Vicodin, or tramadol/Ultram) Take your pain medication as prescribed. Be mindful that most narcotic prescriptions contain Tylenol (acetaminophen) as well - avoid taking too much Tylenol. If you are having problems/concerns with the prescription medicine (does not control pain, nausea, vomiting, rash, itching, etc.), please call us [phone] to see if we need to switch you to a different pain medicine that will work better for you and/or control your side effects better. If you need a refill on your pain medication, you must call the office before 4 pm and on weekdays only.  By federal law, prescriptions for narcotics cannot be called into a pharmacy.  They must be filled out on paper & picked up from our office by the patient or authorized caretaker.  Prescriptions cannot be filled after 4 pm nor on weekends.    WHEN TO CALL us [phone] Severe uncontrolled or worsening pain  Fever over 101 F (38.5 C) Concerns with the incision: Worsening pain, redness, rash/hives, swelling, bleeding, or drainage Reactions / problems with new medications (itching, rash, hives, nausea, etc.) Nausea and/or vomiting Difficulty urinating Difficulty breathing Worsening fatigue, dizziness, lightheadedness, blurred vision Other concerns If you are not getting better after two weeks or are noticing you are getting worse, contact our office [phone] for further advice.  We may need to adjust your medications, re-evaluate you in the office, send you to the emergency room, or see what other things we can do to help. The clinic staff is available to answer your questions during regular business hours (8:30am-5pm).  Please don't hesitate to call and ask to speak to one of our nurses for clinical concerns.    A  surgeon from [facility] is always on call at the hospitals 24 hours/day If you have a medical emergency, go to the nearest emergency room or call 911.  FOLLOW UP in our office One the day of your discharge from the hospital (or the next business weekday), please call [facility] to set up or confirm an appointment to see your surgeon in the office for a follow-up appointment.  Usually it is 2-3 weeks after your surgery.   If you have skin staples at your incision(s), let the office know so we can set up a time in the office for the nurse to remove them (usually around 10 days after surgery). Make sure that  you call for appointments the day of discharge (or the next business weekday) from the hospital to ensure a convenient appointment time. IF YOU HAVE DISABILITY OR FAMILY LEAVE FORMS, BRING THEM TO THE OFFICE FOR PROCESSING.  DO NOT GIVE THEM TO YOUR DOCTOR.  [facility] [address], [place], [place], [place]  [address] ? [phone] - Main [phone] - [place],  [phone] - Fax [email or web address]  GETTING TO GOOD BOWEL HEALTH. It is expected for your digestive tract to need a few months to get back to normal.  It is common for your bowel movements and stools to be irregular.  You will have occasional bloating and cramping that should eventually fade away.  Until you are eating solid food normally, off all pain medications, and back to regular activities; your bowels will not be normal.   Avoiding constipation The goal: ONE SOFT BOWEL MOVEMENT A DAY!    Drink plenty of fluids.  Choose water first. TAKE A FIBER SUPPLEMENT EVERY DAY THE REST OF YOUR LIFE During your first week back home, gradually add back a fiber supplement every day Experiment which form you can tolerate.   There are many forms such as powders, tablets, wafers, gummies, etc Psyllium bran (Metamucil), methylcellulose (Citrucel), Miralax or Glycolax,  Benefiber, Flax Seed.  Adjust the dose week-by-week (1/2 dose/day to 6 doses a day) until you are moving your bowels 1-2 times a day.  Cut back the dose or try a different fiber product if it is giving you problems such as diarrhea or bloating. Sometimes a laxative is needed to help jump-start bowels if constipated until the fiber supplement can help regulate your bowels.  If you are tolerating eating & you are farting, it is okay to try a gentle laxative such as double dose MiraLax, prune juice, or Milk of Magnesia.  Avoid using laxatives too often. Stool softeners can sometimes help counteract the constipating effects of narcotic pain medicines.  It can also cause diarrhea, so avoid using for too long. If you are still constipated despite taking fiber daily, eating solids, and a few doses of laxatives, call our office. Controlling diarrhea Try drinking liquids and eating bland foods for a few days to avoid stressing your intestines further. Avoid dairy products (especially milk & ice cream) for a short time.  The intestines often can lose the ability to digest lactose when stressed. Avoid foods that cause gassiness or bloating.  Typical foods include beans and other legumes, cabbage, broccoli, and dairy foods.  Avoid greasy, spicy, fast foods.  Every person has some sensitivity to other foods, so listen to your body and avoid those foods that trigger problems for you. Probiotics (such as active yogurt, Align, etc) may help repopulate the intestines and colon with normal bacteria and calm down a sensitive digestive tract Adding a fiber supplement gradually can help thicken stools by absorbing excess fluid and retrain the intestines to act more normally.  Slowly increase the dose over a few weeks.  Too much fiber too soon can backfire and cause cramping & bloating. It is okay to try and slow down diarrhea with a few doses of antidiarrheal medicines.   Bismuth subsalicylate (ex. Kayopectate, Pepto Bismol)  for a few doses can help control diarrhea.  Avoid if pregnant.   Loperamide (Imodium) can slow down diarrhea.  Start with one tablet (2mg ) first.  Avoid if you are having fevers or severe pain.  ILEOSTOMY PATIENTS WILL HAVE CHRONIC DIARRHEA since  their colon is not in use.    Drink plenty of liquids.  You will need to drink even more glasses of water/liquid a day to avoid getting dehydrated. Record output from your ileostomy.  Expect to empty the bag every 3-4 hours at first.  Most people with a permanent ileostomy empty their bag 4-6 times at the least.   Use antidiarrheal medicine (especially Imodium) several times a day to avoid getting dehydrated.  Start with a dose at bedtime & breakfast.  Adjust up or down as needed.  Increase antidiarrheal medications as directed to avoid emptying the bag more than 8 times a day (every 3 hours). Work with your wound ostomy nurse to learn care for your ostomy.  See ostomy care instructions. TROUBLESHOOTING IRREGULAR BOWELS 1) Start with a soft & bland diet. No spicy, greasy, or fried foods.  2) Avoid gluten/wheat or dairy products from diet to see if symptoms improve. 3) Miralax 17gm or flax seed mixed in [place]. water or juice-daily. May use 2-4 times a day as needed. 4) Gas-X, Phazyme, etc. as needed for gas & bloating.  5) Prilosec (omeprazole) over-the-counter as needed 6)  Consider probiotics (Align, Activa, etc) to help calm the bowels down  Call your doctor if you are getting worse or not getting better.  Sometimes further testing (cultures, endoscopy, X-ray studies, CT scans, bloodwork, etc.) may be needed to help diagnose and treat the cause of the diarrhea. [facility], [place], [place], [place], [place]  [address] [phone] - Main.    [phone]  - Toll Free.   [phone] - Fax [email or web address]

## 2021-06-12 NOTE — Op Note (Signed)
06/12/2021  2:46 PM  PATIENT:  Grant [name]  70 y.o. 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  This assistant was needed for exposure, dissection, suction, tissue approximation, retraction, perception, etc.   ANESTHESIA:     General  Regional TRANSVERSUS ABDOMINIS PLANE (TAP) nerve block for perioperative & postoperative pain control provided with liposomal bupivacaine (Experel) mixed with 0.25% bupivacaine as a Bilateral TAP block x 23m each side at the level of the transverse abdominis & preperitoneal spaces along the flank at the anterior axillary line, from subcostal ridge to iliac crest under laparoscopic guidance   Local field block at port sites & extraction wound  EBL:  Total I/O In: 1000 [I.V.:1000] Out: 500 [Urine:400; Blood:100]  Delay start of Pharmacological VTE agent (>24hrs) due to surgical blood loss or risk of bleeding:  no  DRAINS: 19 Fr Blake drain goes to the pelvis  SPECIMEN:   RECTOSIGMOID COLON (open end proximal) DISTAL ANASTOMOTIC RING  (final distal margin)  DISPOSITION OF SPECIMEN:  PATHOLOGY  COUNTS:  YES  PLAN OF CARE: Admit to inpatient   PATIENT DISPOSITION:  PACU - hemodynamically stable.  INDICATION:    Patient with history of recurrent episodes of diverticulitis with at least 5 documented attacks.  Recent admission for possible abscess with improvement.  Colonoscopy showing chronic stricture and inflammation.  I recommended segmental resection:  The anatomy & physiology of the digestive tract was discussed.  The pathophysiology was discussed.  Natural history risks without surgery was discussed.   I worked to give an overview of the disease and the frequent need to have multispecialty involvement.  I feel the risks of no intervention will lead to serious problems that outweigh the operative risks; therefore, I recommended a partial colectomy to remove the pathology.  Laparoscopic & open techniques were discussed.   Risks such as bleeding, infection, abscess, leak, reoperation, possible ostomy, hernia, heart attack, death, and other risks were discussed.  I noted a good likelihood this will help address the problem.   Goals of post-operative recovery were discussed as well.  We will work to minimize complications.  Educational materials on the pathology had been given in the office.  Questions were answered.    The patient expressed understanding & wished to proceed with surgery.  OR FINDINGS:   Patient had thickened sigmoid colon with abscess and his proximal sigmoid colon adhered to the left lower quadrant.  Purulence aspirated and abscess cavity excised en bloc.  No obvious metastatic disease on visceral parietal peritoneum or liver.  Patulous rectum with decreased rectal tone overall  The anastomosis rests 14 cm from the anal verge by rigid proctoscopy.  CASE DATA:  Type of patient?: Elective [facility]  Private Case  Status of Case? Elective Scheduled  Infection Present At Time Of Surgery (PATOS)?   ABSCESS  DESCRIPTION:   Informed consent was confirmed.  The patient underwent general anaesthesia without difficulty.  The patient was positioned appropriately.  VTE prevention in place.  Given his recurrent diverticulitis with inflammation along his left ureter I asked urology to cystoscopically place firefly ICG.  This was done by Dr. Louis Meckel.  He noted a enlarged prostate and somewhat torturous urethra.  However he was able to get firefly of both ureters without too much difficulty.  The patient was clipped, prepped, & draped in a sterile fashion.  Surgical timeout confirmed our plan.  The patient was positioned in reverse Trendelenburg.  Abdominal entry was gained using Varess technique at the left subcostal ridge on the anterior abdominal wall.  No elevated EtCO2 noted.  Port placed.  Camera inspection revealed no injury.  Extra ports were carefully placed under direct laparoscopic visualization.  Patient had dense omental adhesions to his periumbilical mesh which we freed off laparoscopically using focused sharp dissection and blunt dissection.  I reflected the greater omentum and the upper abdomen the small bowel in the upper abdomen.  The patient was carefully positioned.  The [employer]uitive daVinci robot was docked with camera & instruments carefully placed.  The patient had thickened phlegmon of his sigmoid colon adherent to the left lower quadrant flank going over the pelvic brim to the dome of the bladder.  Stated to focus on medial lateral dissection to mobilize the rectosigmoid mesentery off the retroperitoneum first.  I mobilized the rectosigmoid colon & elevated it to put the main pedicle on tension.  I scored the base of peritoneum of the medial side of the mesentery of the elevated left colon from the ligament of Treitz to the mid rectum.   I elevated the sigmoid mesentery and entered into the retro-mesenteric plane. We were able to identify the right & left ureters and gonadal vessels. We  kept those posterior within the retroperitoneum and elevated the left colon mesentery off that. I did isolate the inferior mesenteric artery (IMA) pedicle but did not ligate it yet.  I continued distally and got into the avascular plane posterior to the mesorectum, sparing the nervi ergentes.. This allowed me to help mobilize the rectum as well by freeing the mesorectum off the sacrum.  I stayed away from the right and left ureters.  I kept the lateral vascular pedicles to the rectum intact.  I mobilized the left colon mesentery off the retroperitoneum.  Somewhat stuck to the left Gerota's fascia.  Did mobilize some of the colon off the left lower quadrant anterior domino wall and left flank and pelvic brim to help mobilize it.  Upon dissecting and mobilizing sigmoid colon lateral to medial fashion entering the abdomen (organ space), I encountered an abscess in the left flank between the proximal sigmoid colon and abdominal wall.  Aspirated purulence. Mobilized the descending colon up towards the splenic flexure and lateral medial fashion to get some mobility.  Transected greater omentum off its adhesions to the sigmoid colon and left colon as well.  I skeletonized the lymph nodes off the inferior mesenteric artery pedicle.  I went down to its takeoff from the aorta.   I isolated the inferior mesenteric vein off of the ligament of Treitz just cephalad to that as well.  After confirming the left ureter was out of the way, I went ahead and ligated the inferior mesenteric artery pedicle just  near its takeoff from the aorta.  I did ligate the inferior mesenteric vein in a similar fashion.  We ensured hemostasis.  I continued medial to lateral dissection to free the left colon mesentery off the retroperitoneum going up towards the splenic flexure to allow good mobility and protect the colon mesentery.  I mobilized the left colon in a lateral to medial fashion off the retroperitoneum and sidewall attachments along the  line of Toldt up towards the splenic flexure to ensure good mobilization of the remaining left colon to reach into the pelvis.   We then focused on mesorectal dissection.  Freed the mesorectum off the presacral plane until I was distal to the concerning region.  Freed off peritoneum on the lateral sidewalls as well and transected the mesentery of the lateral pedicles to get distal to the area of concern.  Came around anteriorly such that I had good circumferential mesorectal excision and a good margin distal to the area of concern.  I chose a region at the mid descending colon that was at least 5 cm proximal to the left colon abscess that was soft and easily reached down to the rectal stump.  I transected the mesentery of the colon radially to preserve remaining colon blood supply.  I skeletonized the mesorectum.  To access vascular perfusion of tissues, we asked anesthesia use intravenous  indocyanine green (ICG) with IV flush.  I switched to the NIR fluorescence (Firefly mode) imaging window on the daVinci robot platform.  We were able to see good light green visualization of blood vessels with good vascular perfusion of tissues, confirming good tissue perfusion of tissues -descending colon and proximal rectum- planned for anastomosis.  We transected at the rectosigmoid junction using a robotic stapler.  We then chose a region for the proximal margin that would reach well for our planned anastomosis.  Transected the colon mesentery radially to preserve good collateral and marginal artery blood supply.  We created an extraction incision through a small Pfannenstiel incision in the suprapubic region.  Placed a wound protector.  I was able to eviscerate the rectosigmoid and descending colon out the wound.   I clamped the colon proximal to this area using a reusable pursestringer device.  Passed a 2-0 Keith needle. I transected at the descending/sigmoid junction with a scalpel. I got healthy bleeding mucosa.  We  sent the rectosigmoid colon specimen off to go to pathology.  We sized the colon orifice.  I chose a 69m EEA anvil stapler system.  I reinforced the prolene pursestring with interrupted silk suture.  I placed the anvil to the open end of the proximal remaining colon and closed around it using the pursestring.    We did copious irrigation with crystalloid solution.  Hemostasis was good.  The distal end of the remaining colon easily reached down to the rectal stump, therefore, splenic flexure mobilization was not needed.      Dr TMarcello Mooresscrubbed down and did gentle anal dilation and advanced the EEA stapler up the rectal stump.  She confirmed my findings of decreased finger tone but no major true rectal prolapse.  The spike was brought out at the provimal end of the rectal stump under direct visualization.  I  attached the anvil of the proximal colon the spike of the stapler. Anvil was tightened down and held clamped for 60 seconds. The EEA stapler was fired and held clamped for 30 seconds. The stapler was released & removed. We noted 2 excellent anastomotic rings.  Blue stitch is in the proximal ring.  Dr Marcello Moores  did rigid proctoscopy noted the anastomosis was at 14 cm from the anal verge consistent with the proximal rectum.  We irrigation of isotonic solution & held that for the pelvic air leak test .  The rectum was insufflated the rectum while clamping the colon proximal to that anastomosis.  There was a negative air leak test. There was no tension of mesentery or bowel at the anastomosis.   Tissues looked viable.  Ureters & bowel uninjured.  The anastomosis looked healthy.    Did irrigation a several liters to confirm good hemostasis in the retroperitoneum and mesentery and elsewhere.  Greater omentum positioned down into the pelvis to help protect the anastomosis.  Endoluminal gas was evacuated.  Ports & wound protector removed.  We changed gloves & redraped the patient per colon SSI prevention protocol.  We  aspirated the antibiotic irrigation.  Hemostasis was good.  Sterile unused instruments were used from this point.  Because of the abscess in the left lower quadrant, I did place a 37 [place] Blake drain through a right sided port site to run along the left paracolic gutter and down into the pelvis presacral space distal to the colorectal anastomosis.  Drain secured with 2-0 Prolene vertical mattress suture.  I closed the skin at the port sites using Monocryl stitch and sterile dressing.  I closed the extraction wound using a 0 Vicryl vertical peritoneal closure and a #1 PDS transverse anterior rectal fascial closure like a small Pfannenstiel closure. I closed the skin with some interrupted Monocryl stitches. I placed antibiotic-soaked wicks into the closure at the corners x2.  I placed sterile dressings.     Patient is being extubated go to recovery room. I had discussed postop care with the patient in detail the office & in the holding area. Instructions are written. I discussed operative findings, updated the patient's status, discussed probable steps to recovery, and gave postoperative recommendations to the patient's spouse, Rechard Stfort.  She had asked about his periumbilical discomfort at his old hernia site.  Noted he had adhesions there and those were freed off but the mesh did not look infected and otherwise stable.  She seemed reassured.  Other questions answered.  Recommendations were made.   She expressed understanding & appreciation.  Adin Hector, M.D., F.A.C.S. Gastrointestinal and Minimally Invasive Surgery [facility] [address], [place], [place] [address] [phone] Main / Paging

## 2021-06-12 NOTE — Anesthesia Procedure Notes (Signed)
Procedure Name: Intubation Date/Time: 06/12/2021 11:40 AM Performed by: Lavina Hamman, CRNA Pre-anesthesia Checklist: Patient identified, Emergency Drugs available, Suction available, Patient being monitored and Timeout performed Patient Re-evaluated:Patient Re-evaluated prior to induction Oxygen Delivery Method: Circle system utilized Preoxygenation: Pre-oxygenation with 100% oxygen Induction Type: IV induction Ventilation: Mask ventilation without difficulty Laryngoscope Size: Mac and 4 Grade View: Grade I Tube type: Oral Tube size: 7.5 mm Number of attempts: 1 Airway Equipment and Method: Stylet Placement Confirmation: ETT inserted through vocal cords under direct vision, positive ETCO2, CO2 detector and breath sounds checked- equal and bilateral Secured at: 23 cm Tube secured with: Tape Dental Injury: Teeth and Oropharynx as per pre-operative assessment  Comments: ATOI

## 2021-06-12 NOTE — Consult Note (Signed)
[facility] Nurse requested for preoperative stoma site marking   Discussed surgical procedure and stoma creation with patient.  Explained role of the [facility] nurse team.  Provided the patient with educational booklet and provided samples of pouching options.  Answered patient 's questions.    Examined patient  sitting, and standing in order to place the marking in the patient's visual field, away from any creases or abdominal contour issues and within the rectus muscle.  Patient with protuberant abdomen and has significant fold lower than marking locations which should be avoided if possible.   Marked for colostomy in the LUQ  ___5_ cm to the left of the umbilicus and [id number] above the umbilicus.   Marked for ileostomy in the RUQ  __5__cm to the right of the umbilicus and  [id number] cm above the umbilicus.   Patient's abdomen cleansed with CHG wipes at site markings, allowed to air dry prior to marking. Plans for surgery this morning.   [facility] Nurse team will follow up with patient after surgery for continued ostomy care and teaching. If he receives an ostomy.  Thank-you,  Julien Girt MSN, [place], [place], [place], [place]

## 2021-06-12 NOTE — Progress Notes (Signed)
Preop completed with Pt's spouse [facility]) Grant [name] [phone]. States sometimes with more information Pt he becomes overwelmed and experiences cognitive decline (unable to remember): he is alert and oriented x4.  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  He does work in yard and complete house projects, [employer] to 2 miles per day. He is light and sound sensitive, asks to please turn off beeping monitors, does like to sleep with TV on.

## 2021-06-12 NOTE — Transfer of Care (Signed)
Immediate Anesthesia Transfer of Care Note  Patient: Grant [name]  Procedure(s) Performed: ROBOTIC SIGMOID COLECTOMY, ROBOTIC LYSIS OF ADHESIONS, DRAINAGE OF PELVIC ABSCESS WITH INTRAOPERATIVE ASSESSMENT OF PERFUSION WITH FIREFLY, BILATERAL TAP BLOCK (Abdomen) RIGID PROCTOSCOPY CYSTOSCOPY with FIREFLY INJECTION  Patient Location: PACU  Anesthesia Type:General  Level of Consciousness: drowsy  Airway & Oxygen Therapy: Patient Spontanous Breathing and Patient connected to face mask oxygen  Post-op Assessment: Report given to RN and Post -op Vital signs reviewed and stable  Post vital signs: Reviewed and stable  Last Vitals:  Vitals Value Taken Time  BP 109/78 06/12/21 1454  Temp    Pulse 71 06/12/21 1456  Resp 21 06/12/21 1456  SpO2 100 % 06/12/21 1456  Vitals shown include unvalidated device data.  Last Pain:  Vitals:   06/12/21 1049  TempSrc:   PainSc: 2       Patients Stated Pain Goal:  (unable) ([id number])  Complications: No notable events documented.

## 2021-06-12 NOTE — Anesthesia Postprocedure Evaluation (Signed)
Anesthesia Post Note  Patient: Grant [name]  Procedure(s) Performed: ROBOTIC SIGMOID COLECTOMY, ROBOTIC LYSIS OF ADHESIONS, DRAINAGE OF PELVIC ABSCESS WITH INTRAOPERATIVE ASSESSMENT OF PERFUSION WITH FIREFLY, BILATERAL TAP BLOCK (Abdomen) RIGID PROCTOSCOPY CYSTOSCOPY with FIREFLY INJECTION     Patient location during evaluation: PACU Anesthesia Type: General Level of consciousness: awake and alert Pain management: pain level controlled Vital Signs Assessment: post-procedure vital signs reviewed and stable Respiratory status: spontaneous breathing, nonlabored ventilation, respiratory function stable and patient connected to nasal cannula oxygen Cardiovascular status: blood pressure returned to baseline and stable Postop Assessment: no apparent nausea or vomiting Anesthetic complications: no   No notable events documented.  Last Vitals:  Vitals:   06/12/21 1630 06/12/21 1706  BP: 114/76 114/75  Pulse: 75 68  Resp: (!) 21 16  Temp:    SpO2: 97% 98%    Last Pain:  Vitals:   06/12/21 1722  TempSrc:   PainSc: 6                  Channin Agustin P Jacky Hartung

## 2021-06-12 NOTE — H&P (Signed)
06/12/2021      Grant [name] DOB: 09-May-1951 Married / Language: Cleophus Molt / Race: White Male   Patient Care Team: Shirline Frees, MD as PCP - General (Family Medicine) Josue Hector, MD as PCP - Cardiology (Cardiology) Cameron Sprang, MD as Consulting Physician (Neurology) Lavonna Monarch, MD as Consulting Physician (Dermatology) [name], MD as Consulting Physician (Colon and Rectal Surgery) Clarene Essex, MD as Consulting Physician (Gastroenterology)  ` ` Patient sent for surgical consultation at the request of Dr Watt Climes   Chief Complaint: Recurrent diverticulitis with stricture. ` ` The patient is a pleasant active male here with his wife.  He has a [name] of health issues.  His had a heart valve replacement.  History of proximal atrial fibrillation and hypertension.  Chronically anticoagulated.  Looks like he had been Present 2018 without any obvious coronary disease.  Follow-up by Dr. [name].  Also more recently has had some leg tremors and facial lip smacking.  Seen by neurology.  No evidence of any stroke or tumor.  Following expectantly by Dr Delice Lesch with Neurology at this point.   Patient referred to me by Dr. Harlow Asa.  He is had episodes of diverticulitis involving his sigmoid colon.  Left lower quadrant pain and fevers and regular bowels.  I think he's had at least 5 attacks within the past year or so.  He had a more severe episode and was admitted December 2021 requiring IV antibiotics.  No definite abscess.  His having some urinary symptoms.  He was seen by urology.  Dr. Junious Silk apparently did cystoscopy and rule out a fistula.  Anoscopy by Dr. Watt Climes in April of this year.  Stricture noted in the sigmoid.  No definite cancer.  A more proximal adenomatous polyp noted.  A Magod strongly recommended considering surgery given the more frequent attacks and now strictures.  Patient saw Dr. Harlow Asa.  Referred to me make in my colorectal expertise.  Patient claims he moves his  bowels every day.  His wife has them on a fiber supplement and probiotic.  They tend to live down in [place] better appears well.  He can walk at least a mile without much difficulty.  Followed by cardiology with no major issues.  Has a prescription for Augmentin with refills.  Had to have a 5 day course of that last week for pain suspicious for diverticulitis.  He's had CAT scans the past 2 years showing diverticulitis in the mid sigmoid colon.   Patient also has a distant history of having a cholecystectomy and then a periumbilical incisional hernia fixed with a patch of mesh.  Patient feels sensitivity at his belly button is if the stitches are almost poking through but they never do.  Wondered if the mesh could be removed.  He does have swelling just below his sternum.  Apparently he woke up well from his cardiac surgery and pulled some of his sternal sutures.  He does have sleep apnea.  Uses CPAP.  No diabetes.  He does not smoke.  He is chronically anticoagulated on Xerelto.  His wife claims he gets quite exhausted easily and has chronic fatigue syndrome.  Apparently has reactive airway disease. Dr. Melvyn Novas with pulmonary follows   (Review of systems as stated in this history (HPI) or in the review of systems.  Otherwise all other 12 point ROS are negative) ` ` ###########################################`   This patient encounter took 45 minutes today to perform the following: obtain history, perform exam, review outside  records, interpret tests & imaging, counsel the patient on their diagnosis; and, document this encounter, including findings & plan in the electronic health record (EHR).     Problem List/Past Medical Adin Hector, MD; April 12, 2021 1:06 PM) DIASTASIS RECTI (M62.08)  INCISIONAL PAIN (L76.82)  DIVERTICULITIS, COLON (K57.32)  DIVERTICULAR STRICTURE (K56.699)  DIVERTICULAR DISEASE OF COLON (K57.30)  PREOP COLON - ENCOUNTER FOR PREOPERATIVE EXAMINATION FOR GENERAL SURGICAL  PROCEDURE (Z01.818)  HISTORY OF UMBILICAL HERNIA REPAIR (Z98.890)  INCISIONAL HERNIA, WITHOUT OBSTRUCTION OR GANGRENE (K43.2)  ANTICOAGULATED (Z79.01)  RESTLESS LEG (G25.81)    Past Surgical History Carrolyn Leigh, [place]; Apr 12, 2021 11:39 AM) Colon Polyp Removal - Colonoscopy  Aneurysm Repair  Valve Replacement  Gallbladder Surgery - Laparoscopic  Oral Surgery    Diagnostic Studies History Carrolyn Leigh, CMA; 04-12-21 11:39 AM) Colonoscopy  within last year   Allergies Carrolyn Leigh, CMA; 04-12-21 11:39 AM) Codeine/Codeine Derivatives  Difficulty breathing, Dermatitis, Diarrhea. Amiodarone HCl *ANTIARRHYTHMICS*  traMADol HCl *ANALGESICS - OPIOID*  Difficulty breathing. Perphenazine *CHEMICALS*  Allergies Reconciled    Medication History Adin Hector, MD; 2021/04/12 1:06 PM) Doxycycline Hyclate  ('100MG'$  Capsule, Oral) Active. Metoprolol Tartrate  ('50MG'$  Tablet, Oral) Active. Omeprazole  ('40MG'$  Capsule DR, Oral) Active. Venlafaxine HCl ER  ('150MG'$  Capsule ER 24HR, Oral) Active. Tylenol  ('500MG'$  Capsule, 1000 mg Oral) Active. Aimovig  ('140MG'$ /ML Soln Auto-inj, Subcutaneous) Active. Ventolin HFA  (108 (90 Base)MCG/ACT Aerosol Soln, Inhalation) Active. ALPRAZolam  (0.'25MG'$  Tablet, Oral) Active. Gaviscon  (95-'358MG'$ /15ML Suspension, Oral) Active. Amoxicillin  ('500MG'$  Capsule, Oral) Active. Astelin  (137MCG/SPRAY Solution, Nasal) Active. Benzonatate  ('100MG'$  Capsule, Oral) Active. Blue-Emu Super Strength  (External) Active. Symbicort  (80-4.5MCG/ACT Aerosol, Inhalation) Active. Tums  ('500MG'$  Tablet Chewable, Oral) Active. Chlor-Trimeton  ('4MG'$  Tablet, Oral) Active. Finasteride  ('5MG'$  Tablet, Oral) Active. Liothyronine Sodium  (25MCG Tablet, Oral) Active. Patanol  (0.1% Solution, Ophthalmic) Active. Penicillin V Potassium  ('500MG'$  Tablet, Oral) Active. Xarelto  ('20MG'$  Tablet, Oral) Active. Florastor  ('250MG'$  Packet, Oral) Active. Ocean Complete Sinus Rinse  (Nasal) Active. Tessalon  ('200MG'$  Capsule,  Oral) Active. Temovate  (0.05% Cream, External) Active. DHEA  ('50MG'$  Capsule, Oral) Active. Benadryl  ('25MG'$  Capsule, Oral) Active. EPINEPHrine  (0.'3MG'$ /0.3ML Device, Injection) Active. Methylcellulose  Active. Iron  ('45MG'$  Tablet, Oral) Active. Mucinex DM Maximum Strength  (60-'1200MG'$  Tablet ER 12HR, Oral) Active. Multivitamin/Mineral Formula  (Oral) Active. Tamsulosin HCl  (0.'4MG'$  Capsule, Oral) Active. Rizatriptan Benzoate  ('10MG'$  Tablet, Oral) Active. Losartan Potassium  ('50MG'$  Tablet, Oral) Active. Levothyroxine Sodium  (112MCG Tablet, Oral) Active. Benzonatate  ('200MG'$  Capsule, Oral) Active. Testosterone Cypionate  ('100MG'$ /ML Solution, Intramuscular) Active. Colchicine  (0.'6MG'$  Tablet, Oral) Active. levoFLOXacin  ('500MG'$  Tablet, Oral) Active. Medications Reconciled  Neomycin Sulfate  ('500MG'$  Tablet, 2 (two) Oral SEE NOTE, Taken starting 2021/04/12) Active. (TAKE TWO TABLETS AT 2 PM, 3 PM, AND 10 PM THE DAY PRIOR TO SURGERY) metroNIDAZOLE  ('500MG'$  Tablet, 2 (two) Oral three times daily, Taken starting Apr 12, 2021) Active. (Pharmacy Instructions: Take 2 tablets at 2pm, 3pm, and 10pm the day prior to your colon operation.)   Social History Carrolyn Leigh, [place]; 04-12-2021 11:39 AM) Alcohol use  Moderate alcohol use. Caffeine use  Carbonated beverages, Tea. No caffeine use  No drug use  Tobacco use  Former smoker.   Family History Carrolyn Leigh, [place]; 04/12/2021 11:39 AM) Colon Polyps  Father. Heart Disease  Brother. Heart disease in male family member before age 64  Hypertension  Mother. Migraine Headache  Mother.   Other Problems Adin Hector, MD; 2021/04/12 1:06 PM)  Atrial Fibrillation  Arthritis  Asthma  Diverticulosis  Enlarged Prostate  Back Pain  Gastroesophageal Reflux Disease  Cholelithiasis  General anesthesia - complications  Migraine Headache  Other disease, cancer, significant illness  Sleep Apnea  High blood pressure  Thyroid Disease  Transfusion history  Umbilical Hernia  Repair     BP (!) 142/79   Pulse (!) 56   Temp 97.7 F (36.5 C) (Oral)   Resp 15   SpO2 99%  06/12/2021      Physical Exam Adin Hector MD; 04/09/2021 12:57 PM)   General Mental Status - Alert. General Appearance - Not in acute distress, Not Sickly. Orientation - Oriented X3. Hydration - Well hydrated. Voice - Normal. Note:  Pleasant and talkative. Restless. Moving legs a [name]. Smacking lips. Somewhat slow slurred speech but oriented   Integumentary Global Assessment Upon inspection and palpation of skin surfaces of the - Axillae: non-tender, no inflammation or ulceration, no drainage. and Distribution of scalp and body hair is normal. General Characteristics Temperature - normal warmth is noted.   Head and Neck Head - normocephalic, atraumatic with no lesions or palpable masses. Face Global Assessment - atraumatic, no absence of expression. Neck Global Assessment - no abnormal movements, no bruit auscultated on the right, no bruit auscultated on the left, no decreased range of motion, non-tender. Trachea - midline. Thyroid Gland Characteristics - non-tender.   Eye Eyeball - Left - Extraocular movements intact, No Nystagmus - Left. Eyeball - Right - Extraocular movements intact, No Nystagmus - Right. Cornea - Left - No Hazy - Left. Cornea - Right - No Hazy - Right. Sclera/Conjunctiva - Left - No scleral icterus, No Discharge - Left. Sclera/Conjunctiva - Right - No scleral icterus, No Discharge - Right. Pupil - Left - Direct reaction to light normal. Pupil - Right - Direct reaction to light normal.   ENMT Ears Pinna - Left - no drainage observed, no generalized tenderness observed. Pinna - Right - no drainage observed, no generalized tenderness observed. Nose and Sinuses External Inspection of the Nose - no destructive lesion observed. Inspection of the nares - Left - quiet respiration. Inspection of the nares - Right - quiet respiration. Mouth and Throat Lips -  Upper Lip - no fissures observed, no pallor noted. Lower Lip - no fissures observed, no pallor noted. Nasopharynx - no discharge present. Oral Cavity/Oropharynx - Tongue - no dryness observed. Oral Mucosa - no cyanosis observed. Hypopharynx - no evidence of airway distress observed.   Chest and Lung Exam Inspection Movements - Normal and Symmetrical. Accessory muscles - No use of accessory muscles in breathing. Palpation Palpation of the chest reveals - Non-tender. Auscultation Breath sounds - Normal and Clear.   Cardiovascular Auscultation Rhythm - Regular. Murmurs & Other Heart Sounds - Auscultation of the heart reveals - No Murmurs and No Systolic Clicks.   Abdomen Inspection Inspection of the abdomen reveals - No Visible peristalsis and No Abnormal pulsations. Umbilicus - No Bleeding, No Urine drainage. Palpation/Percussion Palpation and Percussion of the abdomen reveal - Soft, Non Tender, No Rebound tenderness, No Rigidity (guarding) and No Cutaneous hyperesthesia. Note:  Abdomen soft.  Nontender.  Not distended.  I can feel stitches at his umbilicus consistent with prior mesh repair.   There is no recurrence or erosion or irritation. No sensitivity today. Epigastric hernia just underneath the xiphoid at incision. No other incisional hernias.  No guarding.   Male Genitourinary Sexual Maturity Tanner 5 - Adult hair pattern and Adult penile  size and shape.   Peripheral Vascular Upper Extremity Inspection - Left - No Cyanotic nailbeds - Left, Not Ischemic. Inspection - Right - No Cyanotic nailbeds - Right, Not Ischemic.   Neurologic Neurologic evaluation reveals  - normal attention span and ability to concentrate, able to name objects and repeat phrases. Appropriate fund of knowledge , normal sensation and normal coordination. Mental Status Affect - not angry, not paranoid. Cranial Nerves - Normal Bilaterally. Gait - Normal.   Neuropsychiatric Mental status exam performed  with findings of - able to articulate well with normal speech/language, rate, volume and coherence, thought content normal with ability to perform basic computations and apply abstract reasoning and no evidence of hallucinations, delusions, obsessions or homicidal/suicidal ideation.   Musculoskeletal Global Assessment Spine, Ribs and Pelvis - no instability, subluxation or laxity. Right Upper Extremity - no instability, subluxation or laxity.   Lymphatic Head & Neck   General Head & Neck Lymphatics: Bilateral - Description - No Localized lymphadenopathy. Axillary   General Axillary Region: Bilateral - Description - No Localized lymphadenopathy. Femoral & Inguinal   Generalized Femoral & Inguinal Lymphatics: Left - Description - No Localized lymphadenopathy. Right - Description - No Localized lymphadenopathy.       Assessment & Plan   DIVERTICULAR STRICTURE [phone]) Impression: Recurrent episodes of diverticulitis now with stricture.   He's had many attacks at least 5 in the past 2 years. One episode severe enough requiring admission last winter.   I think he would benefit from segmental colonic resection. Looking at the CAT scan, looks like it's in the mid sigmoid colon. Colonoscopy done 2 months ago disproves any malignancy. Recent urology visit argued against any obvious colovesical fistula. I do not think an enema is needed at this time, especially in light of his inability to tolerate the prep and it had to be rescheduled anyway   Think is reasonable candidate for robotic segmental resection. Most likely due firefly. Ostomy marking just encase probably not to likely.   Allow the patient and his wife to express concerns and past history. Given chance to ask questions. I think they feel comfortable with this plan and are motivated to proceed with surgery to break the cycle attacks and avoid progression to obstruction or perforation.   He is on Xerelto and has had some cardiac  issues. He seems to have pretty good performance status. Would like to double check with Dr. admission to make sure that there are no concerns holding Xerelto 2 days preop or any other further cardiac workup.   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 However there is no major seizure disorder or malignancy that is making neurology concerned this time. He seems to have good mobility in exercise tolerance. Defer to Dr. Delice Lesch with [facility] neurology.   The anatomy & physiology of the digestive tract was discussed.  The pathophysiology of the colon was discussed.  Natural history risks without surgery was discussed.   I feel the risks of no intervention will lead to serious problems that outweigh the operative risks; therefore, I recommended a partial colectomy to remove the pathology.  Minimally invasive (Robotic/Laparoscopic) & open techniques were discussed.   Risks such as bleeding, infection, abscess, leak, reoperation, possible ostomy, hernia, heart attack, death, and other risks were discussed.  I noted a good likelihood this will help address the problem.   Goals of post-operative recovery were discussed as well.   Need for adequate nutrition, daily  bowel regimen and healthy physical activity, to optimize recovery was noted as well. We will work to minimize complications.  Educational materials were available as well.  Questions were answered.  The patient expresses understanding & wishes to proceed with surgery.   Pt Education - [facility] Colon Bowel Prep 2018 ERAS/Miralax/Antibiotics Pt Education - Pamphlet Given - Laparoscopic Colorectal Surgery: discussed with patient and provided information. Pt Education - [facility] Colectomy post-op instructions: discussed with patient and provided information. Started metroNIDAZOLE 500 MG Oral Tablet, 2 (two) Tablet three times daily, #6, 1 day starting 04/09/2021, No Refill. Local Order: Pharmacist Notes:  Pharmacy Instructions: Take 2 tablets at 2pm, 3pm, and 10pm the day prior to your colon operation. Started Neomycin Sulfate 500 MG Oral Tablet, 2 (two) Tablet SEE NOTE, #6, 04/09/2021, No Refill. Local Order: Pharmacist Notes: TAKE TWO TABLETS AT 2 PM, 3 PM, AND 10 PM THE DAY PRIOR TO SURGERY   ANTICOAGULATED (Z79.01)   Current Plans I recommended obtaining preoperative cardiac clearance.  I am concerned about the health of the patient and the ability to tolerate the operation.  Therefore, we will request clearance by cardiology to better assess operative risk & see if a reevaluation, further workup, etc is needed.  Also recommendations on how medications such as for anticoagulation and blood pressure should be managed/held/restarted after surgery. Pt Education - [facility] Hold anticoagulation preoperatively   RESTLESS LEG (G25.81)     INCISIONAL HERNIA, WITHOUT OBSTRUCTION OR GANGRENE (K43.2) Impression: Epigastric incisional hernia. Not causing pain and symptoms and does not appear to change in size. I would leave this alone.     HISTORY OF UMBILICAL HERNIA REPAIR [phone]) Impression: History of prior periumbilical hernia repair with mesh. I feel some permanent sutures. I would hold off on trying to remove sutures anything for risk of hernia recurrence. He seems somewhat reassured.  Recent COVID+ test - surgery delayed 2 weeks despite pt & wife insisting home test was negative - ready now   Adin Hector, MD, FACS, MASCRS Esophageal, Gastrointestinal & Colorectal Surgery Robotic and Minimally Invasive Surgery   [facility] [address] [place], [place] [address] [phone] Fax [phone] Main/Paging   CONTACT INFORMATION: Weekday (9AM-5PM) concerns: Call [facility] main office at [phone] Weeknight (5PM-9AM) or Weekend/Holiday concerns: Check [email or web address] for General Surgery [facility] coverage (Please, do not use SecureChat as it is not reliable  communication to operating surgeons for immediate patient care)

## 2021-06-13 ENCOUNTER — Other Ambulatory Visit (HOSPITAL_COMMUNITY): Payer: Self-pay

## 2021-06-13 ENCOUNTER — Encounter (HOSPITAL_COMMUNITY): Payer: Self-pay | Admitting: Surgery

## 2021-06-13 ENCOUNTER — Other Ambulatory Visit: Payer: Self-pay

## 2021-06-13 DIAGNOSIS — K5720 Diverticulitis of large intestine with perforation and abscess without bleeding: Secondary | ICD-10-CM

## 2021-06-13 DIAGNOSIS — E876 Hypokalemia: Secondary | ICD-10-CM

## 2021-06-13 LAB — CBC
HCT: 40.8 % (ref 39.0–52.0)
Hemoglobin: 13.6 g/dL (ref 13.0–17.0)
MCH: 32.8 pg (ref 26.0–34.0)
MCHC: 33.3 g/dL (ref 30.0–36.0)
MCV: 98.3 fL (ref 80.0–100.0)
Platelets: 150 10*3/uL (ref 150–400)
RBC: 4.15 MIL/uL — ABNORMAL LOW (ref 4.22–5.81)
RDW: 12.6 % (ref 11.5–15.5)
WBC: 13.4 10*3/uL — ABNORMAL HIGH (ref 4.0–10.5)
nRBC: 0 % (ref 0.0–0.2)

## 2021-06-13 LAB — BASIC METABOLIC PANEL
Anion gap: 5 (ref 5–15)
BUN: 10 mg/dL (ref 8–23)
CO2: 28 mmol/L (ref 22–32)
Calcium: 8.3 mg/dL — ABNORMAL LOW (ref 8.9–10.3)
Chloride: 102 mmol/L (ref 98–111)
Creatinine, Ser: 0.85 mg/dL (ref 0.61–1.24)
GFR, Estimated: >60 mL/min (ref >60)
Glucose, Bld: 142 mg/dL — ABNORMAL HIGH (ref 70–99)
Potassium: 3.8 mmol/L (ref 3.5–5.1)
Sodium: 135 mmol/L (ref 135–145)

## 2021-06-13 LAB — MAGNESIUM: Magnesium: 1.5 mg/dL — ABNORMAL LOW (ref 1.7–2.4)

## 2021-06-13 MED ORDER — SODIUM CHLORIDE 0.9% FLUSH: 3.0000 mL | INTRAVENOUS | Status: DC | PRN

## 2021-06-13 MED ORDER — POTASSIUM CHLORIDE CRYS ER 20 MEQ PO TBCR
40.0000 meq | EXTENDED_RELEASE_TABLET | Freq: Every day | ORAL | Status: DC
  Administered 2021-06-13 – 2021-06-14 (×2): 40 meq via ORAL
  Filled 2021-06-13 (×2): qty 2

## 2021-06-13 MED ORDER — SODIUM CHLORIDE 0.9% FLUSH
3.0000 mL | Freq: Two times a day (BID) | INTRAVENOUS | Status: DC
  Administered 2021-06-13 – 2021-06-14 (×2): 3 mL via INTRAVENOUS

## 2021-06-13 MED ORDER — MAGNESIUM SULFATE 4 GM/100ML IV SOLN
4.0000 g | Freq: Once | INTRAVENOUS | Status: AC
  Administered 2021-06-13: 4 g via INTRAVENOUS
  Filled 2021-06-13: qty 100

## 2021-06-13 MED ORDER — LACTATED RINGERS IV BOLUS: 1000.0000 mL | Freq: Three times a day (TID) | INTRAVENOUS | Status: DC | PRN

## 2021-06-13 MED ORDER — SODIUM CHLORIDE 0.9 % IV SOLN: 250.0000 mL | INTRAVENOUS | Status: DC | PRN

## 2021-06-13 NOTE — Plan of Care (Signed)
  Problem: Education: Goal: Knowledge of General Education information will improve Description: Including pain rating scale, medication(s)/side effects and non-pharmacologic comfort measures Outcome: Progressing   Problem: Clinical Measurements: Goal: Ability to maintain clinical measurements within normal limits will improve Outcome: Progressing Goal: Will remain free from infection Outcome: Progressing Goal: Diagnostic test results will improve Outcome: Progressing Goal: Respiratory complications will improve Outcome: Progressing Goal: Cardiovascular complication will be avoided Outcome: Progressing   Problem: Activity: Goal: Risk for activity intolerance will decrease Outcome: Progressing   Problem: Nutrition: Goal: Adequate nutrition will be maintained Outcome: Progressing   Problem: Coping: Goal: Level of anxiety will decrease Outcome: Progressing   Problem: Elimination: Goal: Will not experience complications related to bowel motility Outcome: Progressing   Problem: Pain Managment: Goal: General experience of comfort will improve Outcome: Progressing   Problem: Safety: Goal: Ability to remain free from injury will improve Outcome: Progressing   Problem: Skin Integrity: Goal: Risk for impaired skin integrity will decrease Outcome: Progressing   

## 2021-06-13 NOTE — Progress Notes (Signed)
Reported to Nurse Hildred Alamin.

## 2021-06-13 NOTE — Consult Note (Signed)
[facility] Nurse ostomy follow up Pt went to the OR yesterday with the surgical team and did not receive an ostomy; no further role for [place] team. Please re-consult if further assistance is needed.  Thank-you,  Julien Girt MSN, [place], [place], [place], [place]

## 2021-06-13 NOTE — Progress Notes (Signed)
JP Drain with 30 mL of sanguinous drainage.

## 2021-06-13 NOTE — Progress Notes (Addendum)
Grant [name] [id number] July 20, 1951  CARE TEAM:  PCP: Shirline Frees, MD  Outpatient Care Team: Patient Care Team: Shirline Frees, MD as PCP - General (Family Medicine) Josue Hector, MD as PCP - Cardiology (Cardiology) Cameron Sprang, MD as Consulting Physician (Neurology) Lavonna Monarch, MD as Consulting Physician (Dermatology) [name], MD as Consulting Physician (Colon and Rectal Surgery) Clarene Essex, MD as Consulting Physician (Gastroenterology)  Inpatient Treatment Team: Treatment Team: Attending Provider: [name], MD; Technician: Samara Snide, NT; Registered Nurse: Kathreen Devoid, RN   Problem List:   Active Problems:   Stricture of sigmoid colon ([place])   1 Day Post-Op  06/12/2021  POST-OPERATIVE DIAGNOSIS:  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  Purulence aspirated and abscess cavity excised en bloc.   No obvious metastatic disease on visceral parietal peritoneum or liver.  Patulous rectum with decreased rectal tone overall   The anastomosis rests 14 cm from the anal verge by rigid proctoscopy.   CASE DATA:   Type of patient?: Elective [facility] Private Case   Status of Case? Elective Scheduled   Infection Present At Time Of Surgery (PATOS)?  ABSCESS  06/12/2021  Preoperative diagnosis:   Diverticulitis Postoperative diagnosis:  Same   Procedure: Cystoscopy Instillation of ureteral firefly constrast   Surgeon: Ardis Hughs, MD   Anesthesia: General   Complications: None   Intraoperative findings:  enlarged prostate with J-hooked ureters.     Assessment  Recovering so far  [facility] Stay = 1 days)  Plan:  -Follow-up on pathology.  Enhanced recovery clinic successfully.  Advance diet.  Most likely keep drain during this admission and removed at discharge if recovers well.  Abscess relatively small and purulence aspirated quickly.  Anastomosis leak from the abscess.  Given antibiotics 23 hours.  Follow-up clinically  Mobilize.  Nightly CPAP for sleep apnea  Anxiety, photophobia, nausea, etc.  Managed with home medications and support.  Fluids.  Backup.  Current hypomagnesemia & hypokalemia.  Some hematuria with Foley catheter.  Most likely can remove follow-up.  Defer to urology.  VTE prophylaxis- SCDs, etc  -mobilize as tolerated to help recovery  Disposition:  Disposition:  The patient is from: Home  Anticipate discharge to:  Home  Anticipated Date of Discharge is:  August 27,2022    Barriers to discharge:  Pending Clinical improvement (more likely than not)  Patient currently is NOT MEDICALLY STABLE for discharge from the hospital from a surgery standpoint.      25 minutes spent in review, evaluation, examination, counseling, and coordination of care.   I have reviewed this patient's available data, including medical history, events of note, physical examination and test results as part of my evaluation.  A significant portion of that time was spent in counseling.  Care during the described time interval was provided by me.  06/13/2021    Subjective: (Chief complaint)  Some initial hematuria but appears to have resolved.  Tolerating liquids.  Walking in room but has not gone in hallways.    Objective:  Vital signs:  Vitals:   06/12/21 2230 06/13/21 0135  06/13/21 0300 06/13/21 0501  BP: 107/77 121/76  108/68  Pulse: 77 69  73  Resp: '16 16  18  '$ Temp: 98.1 F (36.7 C) 98.3 F (36.8 C)  98.5 F (36.9 C)  TempSrc: Oral Oral  Oral   SpO2: 94% 97%  96%  Weight:   101.2 kg 100.7 kg  Height:   '5\' 10"'$  (1.778 m) '5\' 10"'$  (1.778 m)    Last BM Date: 06/12/21  Intake/Output   Yesterday:  08/24 0701 - 08/25 0700 In: 2890 [P.O.:540; I.V.:2250; IV Piggyback:100] Out: 2295 [Urine:1775; Drains:420; Blood:100] This shift:  Total I/O In: 970 [P.O.:420; I.V.:550] Out: 380 [Urine:300; Drains:80]  Bowel function:  Flatus: YES  BM:  No  Drain: Serosanguinous   Physical Exam:  General: Pt awake/alert in no acute distress Eyes: PERRL, normal EOM.  Sclera clear.  No icterus Neuro: CN II-XII intact w/o focal sensory/motor deficits. Lymph: No head/neck/groin lymphadenopathy Psych:  No delerium/psychosis/paranoia.  Oriented x 4 HENT: Normocephalic, Mucus membranes moist.  No thrush Neck: Supple, No tracheal deviation.  No obvious thyromegaly Chest: No pain to chest wall compression.  Good respiratory excursion.  No audible wheezing CV:  Pulses intact.  Regular rhythm.  No major extremity edema MS: Normal AROM mjr joints.  No obvious deformity  Abdomen: Soft.  Nondistended.  Mildly tender at incisions only.  No evidence of peritonitis.  No incarcerated hernias.  Ext:   No deformity.  No mjr edema.  No cyanosis Skin: No petechiae / purpurea.  No major sores.  Warm and dry    Results:   Cultures: Recent Results (from the past 720 hour(s))  SARS Coronavirus 2 (TAT 6-24 hrs)     Status: Abnormal   Collection Time: 05/22/21 12:00 AM  Result Value Ref Range Status   SARS Coronavirus 2 RESULT: POSITIVE (A)  Final    Comment: RESULT: POSITIVESARS-CoV-2 INTERPRETATION:A POSITIVE  test result means that SARS-CoV-2 RNA was present in the specimen above the limit of detection of this test. The presence of SARS-CoV-2 RNA is indicative of infection with SARS-CoV-2. Detection of  SARS-CoV-2 RNA may not rule-out bacterial infection or co-infection with other viruses. Positive and negative predictive values of testing are highly  dependent on prevalence. False positive test results are more likely when prevalence of disease is  low.The expected result is NEGATIVE.Fact Sheet for Healthcare Providers: SisterViews.hu Sheet for Patients: SwimConditioning.hu Reference Range - Negative     Labs: Results for orders placed or performed during the hospital encounter of 06/12/21 (from the past 48 hour(s))  Type and screen [place]     Status: None   Collection Time: 06/12/21 10:30 AM  Result Value Ref Range   ABO/RH(D) A POS    Antibody Screen NEG    Sample Expiration      06/15/2021,2359 Performed at [facility], [place] [address], [place], [employer] [id number]   Basic metabolic panel     Status: Abnormal   Collection Time: 06/13/21  4:12 AM  Result Value Ref Range   Sodium 135 135 - 145 mmol/L   Potassium 3.8 3.5 - 5.1 mmol/L   Chloride 102 98 - 111 mmol/L   CO2 28 22 - 32 mmol/L   Glucose, Bld 142 (H) 70 - 99 mg/dL    Comment: Glucose reference range applies only to samples taken after fasting for at least 8 hours.   BUN 10 8 - 23 mg/dL   Creatinine, Ser 0.85 0.61 - 1.24 mg/dL  Calcium 8.3 (L) 8.9 - 10.3 mg/dL   GFR, Estimated >60 >60 mL/min    Comment: (NOTE) Calculated using the CKD-EPI Creatinine Equation (2021)    Anion gap 5 5 - 15    Comment: Performed at [facility], [place] [address] [place], [place] [address]  CBC     Status: Abnormal   Collection Time: 06/13/21  4:12 AM  Result Value Ref Range   WBC 13.4 (H) 4.0 - 10.5 K/uL   RBC 4.15 (L) 4.22 - 5.81 MIL/uL   Hemoglobin 13.6 13.0 - 17.0 g/dL   HCT 40.8 39.0 - 52.0 %   MCV 98.3 80.0 - 100.0 fL   MCH 32.8 26.0 - 34.0 pg   MCHC 33.3 30.0 - 36.0 g/dL   RDW 12.6 11.5 - 15.5 %   Platelets 150 150 - 400 K/uL   nRBC 0.0 0.0 - 0.2 %    Comment: Performed at [facility], [place] [address] [place], [place] [address]   Magnesium     Status: Abnormal   Collection Time: 06/13/21  4:12 AM  Result Value Ref Range   Magnesium 1.5 (L) 1.7 - 2.4 mg/dL    Comment: Performed at [facility], [place] [address] [place], [place] [address]    Imaging / Studies: No results found.  Medications / Allergies: per chart  Antibiotics: Anti-infectives (From admission, onward)    Start     Dose/Rate Route Frequency Ordered Stop   06/12/21 2200  cefoTEtan (CEFOTAN) 2 g in sodium chloride 0.9 % 100 mL IVPB        2 g 200 mL/hr over 30 Minutes Intravenous Every 12 hours 06/12/21 1658 06/12/21 2219   06/12/21 1400  neomycin (MYCIFRADIN) tablet 1,000 mg  Status:  Discontinued       See Hyperspace for full Linked Orders Report.   1,000 mg Oral 3 times per day 06/12/21 0948 06/12/21 0955   06/12/21 1400  metroNIDAZOLE (FLAGYL) tablet 1,000 mg  Status:  Discontinued       See Hyperspace for full Linked Orders Report.   1,000 mg Oral 3 times per day 06/12/21 0948 06/12/21 0955   06/12/21 1000  cefoTEtan (CEFOTAN) 2 g in sodium chloride 0.9 % 100 mL IVPB        2 g 200 mL/hr over 30 Minutes Intravenous On call to O.R. 06/12/21 0948 06/12/21 1200         Note: Portions of this report may have been transcribed using voice recognition software. Every effort was made to ensure accuracy; however, inadvertent computerized transcription errors may be present.   Any transcriptional errors that result from this process are unintentional.    Adin Hector, MD, FACS, MASCRS Esophageal, Gastrointestinal & Colorectal Surgery Robotic and Minimally Invasive Surgery  [facility], [place]  [place] [address] [place], [place] [address] [phone] Fax [phone] Main  CONTACT INFORMATION:  Weekday (9AM-5PM): Call [facility] main office at [phone]  Weeknight (5PM-9AM) or Weekend/Holiday: Check [email or web address] (password " [email or web address]") for General  Surgery [facility] coverage  (Please, do not use SecureChat as it is not reliable communication to operating surgeons for immediate patient care)      06/13/2021  6:43 AM

## 2021-06-13 NOTE — Progress Notes (Signed)
Mobility Specialist - Progress Note    06/13/21 1041  Mobility  Activity Ambulated in hall  Level of Assistance Contact guard assist, steadying assist  Assistive Device Front wheel walker  Distance Ambulated (ft) 450 ft  Mobility Ambulated with assistance in hallway  Mobility Response Tolerated well  Mobility performed by Mobility specialist;Family member  $Mobility charge 1 Mobility   Pt required no physical assistance sitting EOB, but was encouraged to practice log roll technique when sitting up and required verbal cues from family and mobility specialist to pace himself. Pt used RW to ambulate 450 ft in hallway due to feeling "unsteady". During session, pt did not c/o of pain, dizziness, or SOB. Pt returned to bed after session and would like to ambulate again. Will check back as schedule permits. RN informed of session.   [place] Specialist Acute Rehabilitation Services Phone: [phone] 06/13/21, 10:45 AM

## 2021-06-14 LAB — SURGICAL PATHOLOGY

## 2021-06-14 NOTE — Discharge Summary (Signed)
Physician Discharge Summary    Patient ID: GER PALADINO MRN: [id number] DOB/AGE: 1951-07-06  70 y.o.  Patient Care Team: Shirline Frees, MD as PCP - General (Family Medicine) Josue Hector, MD as PCP - Cardiology (Cardiology) Cameron Sprang, MD as Consulting Physician (Neurology) Lavonna Monarch, MD as Consulting Physician (Dermatology) [name], MD as Consulting Physician (Colon and Rectal Surgery) Clarene Essex, MD as Consulting Physician (Gastroenterology)  Admit date: 06/12/2021  Discharge date: 06/14/2021  Hospital Stay = 2 days    Discharge Diagnoses:  Active Problems:   Obstructive sleep apnea   Essential hypertension   Seasonal and perennial allergic rhinitis   Chronic bronchitis (HCC)   GERD (gastroesophageal reflux disease)   Atrial fibrillation with RVR (HCC)   Morbid obesity due to excess calories ([place]) complicated by HBP, OSA/ gerd    Gout   Personal history of colonic polyps   Recurrent major depression in remission ([place])   Stricture of sigmoid colon (HCC)   Hypomagnesemia   Hypokalemia   Colonic diverticular abscess   2 Days Post-Op  06/12/2021  POST-OPERATIVE DIAGNOSIS:  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  Purulence aspirated and abscess cavity excised en bloc.   No obvious metastatic disease on visceral parietal peritoneum or liver.  Patulous rectum with decreased rectal tone overall   The anastomosis rests 14 cm from the anal verge by rigid proctoscopy.   CASE DATA:   Type of patient?: Elective [facility] Private  Case   Status of Case? Elective Scheduled   Infection Present At Time Of Surgery (PATOS)?  ABSCESS   06/12/2021   Preoperative diagnosis:   Diverticulitis Postoperative diagnosis:  Same   Procedure: Cystoscopy Instillation of ureteral firefly constrast   Surgeon: Ardis Hughs, MD   Anesthesia: General   Complications: None   Intraoperative findings: enlarged prostate with J-hooked ureters.    Consults: None  Hospital Course:   The patient underwent the surgery above.  Postoperatively, the patient gradually mobilized and advanced to a solid diet.  Pain and other symptoms were treated aggressively.  Patient had mild hematuria with  By the time of discharge, the patient was walking well the hallways, eating food, having flatus.  Pain was well-controlled on an oral medications.  Based on meeting discharge criteria and continuing to recover, I felt it was safe for the patient to be discharged from the hospital to further recover with close followup. Postoperative recommendations were discussed in detail.  Spouse in room.  Questions answered. I updated the patient's status to the patient and spouse  Recommendations were made.  Questions were answered.  They expressed understanding & appreciation.  Discharged Condition: good  Discharge Exam: Blood pressure 126/69, pulse 61, temperature 99.1 F (37.3 C), temperature source Oral, resp. rate 17, height '5\' 10"'$  (1.778 m), weight 100.7 kg, SpO2 96 %.  General: Pt awake/alert/oriented x4 in No acute distress Eyes: PERRL, normal EOM.  Sclera clear.  No icterus Neuro: CN II-XII intact w/o focal sensory/motor deficits. Lymph: No head/neck/groin lymphadenopathy Psych:  No delerium/psychosis/paranoia HENT: Normocephalic, Mucus membranes moist.  No thrush Neck: Supple,  No tracheal deviation Chest:  No chest wall pain w good excursion CV:  Pulses intact.  Regular rhythm MS: Normal AROM mjr joints.  No obvious deformity Abdomen:  Soft.  Nondistended.  Nontender.  Dressings clean dry and intact.   no evidence of peritonitis.  No incarcerated hernias. Ext:  SCDs BLE.  No mjr edema.  No cyanosis Skin: No petechiae / purpura   Disposition:    Follow-up Information     [name], MD Follow up in 3 week(s).   Specialties: General Surgery, Colon and Rectal Surgery Why: To follow up after your operation, To follow up after your hospital stay Contact information: [place] [address] [phone]                 Discharge disposition: 01-Home or Self Care       Discharge Instructions     Call MD for:   Complete by: As directed    FEVER > 101.5 F  (temperatures < 101.5 F are not significant)   Call MD for:   Complete by: As directed    FEVER > 101.5 F  (temperatures < 101.5 F are not significant)   Call MD for:  extreme fatigue   Complete by: As directed    Call MD for:  extreme fatigue   Complete by: As directed    Call MD for:  persistant dizziness or light-headedness   Complete by: As directed    Call MD for:  persistant dizziness or light-headedness   Complete by: As directed    Call MD for:  persistant nausea and vomiting   Complete by: As directed    Call MD for:  persistant nausea and vomiting   Complete by: As directed    Call MD for:  redness, tenderness, or signs of infection (pain, swelling, redness, odor or green/yellow discharge around incision site)   Complete by: As directed    Call MD for:  redness, tenderness, or signs of infection (pain, swelling, redness, odor or green/yellow discharge around incision site)   Complete by: As directed    Call MD for:  severe uncontrolled pain   Complete by: As directed    Call MD for:  severe uncontrolled pain   Complete by: As directed    Diet - low sodium heart healthy   Complete by: As directed    Start with a bland diet such as soups, liquids, starchy foods, low fat foods, etc. the first few days at  home. Gradually advance to a solid, low-fat, high fiber diet by the end of the first week at home.   Add a fiber supplement to your diet (Metamucil, etc) If you feel full, bloated, or constipated, stay on a full liquid or pureed/blenderized diet for a few days until you feel better and are no longer constipated.   Discharge instructions   Complete by: As directed    See Discharge Instructions If you are not getting better after two weeks or are noticing you are getting worse, contact our office [phone] for further advice.  We may need to adjust your medications, re-evaluate you in the office, send you to the emergency room, or see what other things we can do to help. The clinic staff is available to answer your questions during regular business hours (8:30am-5pm).  Please don't hesitate to call and ask to speak to one of our nurses for clinical concerns.    A surgeon from [facility]  is always on call at the hospitals 24 hours/day If you have a medical emergency, go to the nearest emergency room or call 911.   Discharge instructions   Complete by: As directed    See Discharge Instructions If you are not getting better after two weeks or are noticing you are getting worse, contact our office [phone] for further advice.  We may need to adjust your medications, re-evaluate you in the office, send you to the emergency room, or see what other things we can do to help. The clinic staff is available to answer your questions during regular business hours (8:30am-5pm).  Please don't hesitate to call and ask to speak to one of our nurses for clinical concerns.    A surgeon from [facility] is always on call at the hospitals 24 hours/day If you have a medical emergency, go to the nearest emergency room or call 911.   Discharge wound care:   Complete by: As directed    It is good for closed incisions and even open wounds to be washed every day.  Shower every day.   Short baths are fine.  Wash the incisions and wounds clean with soap & water.    You may leave closed incisions open to air if it is dry.   You may cover the incision with clean gauze & replace it after your daily shower for comfort.  TEGADERM:  You have clear gauze band-aid dressings over your closed incision(s).  Remove the dressings 3 days after surgery.   Driving Restrictions   Complete by: As directed    You may drive when: - you are no longer taking narcotic prescription pain medication - you can comfortably wear a seatbelt - you can safely make sudden turns/stops without pain.   Increase activity slowly   Complete by: As directed    Start light daily activities --- self-care, walking, climbing stairs- beginning the day after surgery.  Gradually increase activities as tolerated.  Control your pain to be active.  Stop when you are tired.  Ideally, walk several times a day, eventually an hour a day.   Most people are back to most day-to-day activities in a few weeks.  It takes 4-6 weeks to get back to unrestricted, intense activity. If you can walk 30 minutes without difficulty, it is safe to try more intense activity such as jogging, treadmill, bicycling, low-impact aerobics, swimming, etc. Save the most intensive and strenuous activity for last (Usually 4-8 weeks after surgery) such as sit-ups, heavy lifting, contact sports, etc.  Refrain from any intense heavy lifting or straining until you are off narcotics for pain control.  You will have off days, but things should improve week-by-week. DO NOT PUSH THROUGH PAIN.  Let pain be your guide: If it hurts to do something, don't do it.   Lifting restrictions   Complete by: As directed    If you can walk 30 minutes without difficulty, it is safe to try more intense activity such as jogging, treadmill, bicycling, low-impact aerobics, swimming, etc. Save the most intensive and strenuous activity for last (Usually 4-8 weeks after surgery) such as  sit-ups, heavy lifting, contact sports, etc.   Refrain from any intense heavy lifting or straining until you are off narcotics for pain control.  You will have off days, but things should improve week-by-week. DO NOT PUSH THROUGH PAIN.  Let pain be your guide: If it hurts to do something, don't do it.  Pain is your  body warning you to avoid that activity for another week until the pain goes down.   May shower / Bathe   Complete by: As directed    May walk up steps   Complete by: As directed    Remove dressing in 72 hours   Complete by: As directed    Make sure all dressings are removed by the third day after surgery.  Leave incisions open to air.  OK to cover incisions with gauze or bandages as desired   Remove dressing in 72 hours   Complete by: As directed    Make sure all dressings are removed by the third day after surgery.  Leave incisions open to air.  OK to cover incisions with gauze or bandages as desired   Sexual Activity Restrictions   Complete by: As directed    You may have sexual intercourse when it is comfortable. If it hurts to do something, stop.       Allergies as of 06/14/2021       Reactions   Perphenazine Swelling   Tongue swelling   Amiodarone Swelling   Codeine Itching   Tramadol Other (See Comments)   dellusion        Medication List     TAKE these medications    Aimovig 140 MG/ML Soaj Generic drug: Erenumab-aooe Inject 140 mg into the skin every 28 (twenty-eight) days.   albuterol 108 (90 Base) MCG/ACT inhaler Commonly known as: Ventolin HFA INHALE 2 PUFFS INTO THE LUNGS EVERY 6 HOURS AS NEEDED FOR WHEEZING OR SHORTNESS OF BREATH What changed:  how much to take how to take this when to take this reasons to take this additional instructions   ALPRAZolam 0.25 MG tablet Commonly known as: XANAX Take 1 tablet (0.25 mg total) by mouth 3 (three) times daily as needed for anxiety.   aluminum hydroxide-magnesium carbonate 95-358 MG/15ML  Susp Commonly known as: GAVISCON Take 15 mLs by mouth as needed for indigestion or heartburn.   amoxicillin 500 MG capsule Commonly known as: AMOXIL Take 4 capsules (2,000 mg total) by mouth See admin instructions. 1 hour prior to dental procedure   azelastine 0.1 % nasal spray Commonly known as: ASTELIN Place 1-2 sprays into the nose 2 (two) times daily as needed for rhinitis or allergies.   benzonatate 200 MG capsule Commonly known as: TESSALON Take 1 capsule (200 mg total) by mouth 3 (three) times daily as needed for cough.   BLUE-EMU MAXIMUM STRENGTH EX Apply 1 application topically as needed (pain).   budesonide-formoterol 160-4.5 MCG/ACT inhaler Commonly known as: Symbicort Inhale 2 puffs into the lungs in the morning and at bedtime.   calcium carbonate 500 MG chewable tablet Commonly known as: TUMS - dosed in mg elemental calcium Chew 500-1,000 mg by mouth daily as needed for indigestion or heartburn.   chlorpheniramine 4 MG tablet Commonly known as: CHLOR-TRIMETON Take 4 mg by mouth every 6 (six) hours as needed for allergies.   clobetasol ointment 0.05 % Commonly known as: TEMOVATE Apply 1 application topically 2 (two) times daily as needed (rash).   Colcrys 0.6 MG tablet Generic drug: colchicine Take 0.6 mg by mouth in the morning.   diclofenac Sodium 1 % Gel Commonly known as: VOLTAREN Apply 2 g topically daily as needed (pain).   diphenhydrAMINE 25 MG tablet Commonly known as: BENADRYL Take 50 mg by mouth daily as needed for allergies.   EPINEPHrine 0.3 mg/0.3 mL Soaj injection Commonly known as: EPI-PEN Inject 0.3 mLs (  0.3 mg total) into the muscle as needed (for angioedema).   Fiber Therapy 500 MG Tabs Generic drug: Methylcellulose (Laxative) Take 500-1,000 mg by mouth daily.   finasteride 5 MG tablet Commonly known as: PROSCAR Take 5 mg by mouth at bedtime.   Iron 142 (45 Fe) MG Tbcr Take 45 mg by mouth in the morning and at bedtime.    levothyroxine 112 MCG tablet Commonly known as: SYNTHROID Take 112 mcg by mouth daily before breakfast.   liothyronine 25 MCG tablet Commonly known as: CYTOMEL Take 25 mcg by mouth daily before breakfast.   losartan 50 MG tablet Commonly known as: COZAAR TAKE 1 TABLET BY MOUTH EVERY DAY   magnesium oxide 400 MG tablet Commonly known as: MAG-OX Take 400 mg by mouth daily.   metoprolol tartrate 50 MG tablet Commonly known as: LOPRESSOR Take 1 tablet (50 mg total) by mouth 2 (two) times daily.   Mucinex DM Maximum Strength 60-1200 MG Tb12 Take 1 tablet by mouth 2 (two) times daily as needed (congestion/cough).   multivitamin with minerals Tabs tablet Take 1 tablet by mouth daily.   NON FORMULARY Pt uses a cpap nightly   olopatadine 0.1 % ophthalmic solution Commonly known as: PATANOL Place 1 drop into both eyes 2 (two) times daily as needed for allergies.   omeprazole 40 MG capsule Commonly known as: PRILOSEC Take 1 capsule (40 mg total) by mouth 2 (two) times daily.   oxyCODONE 5 MG immediate release tablet Commonly known as: Oxy IR/ROXICODONE Take 1 to 2 tablets (5-10 mg total) by mouth every 6 (six) hours as needed for moderate pain, severe pain or breakthrough pain.   rivaroxaban 20 MG Tabs tablet Commonly known as: Xarelto Take 1 tablet (20 mg total) by mouth daily with supper.   rizatriptan 10 MG tablet Commonly known as: MAXALT Take 1 tablet (10 mg total) by mouth as needed for migraine. May repeat in 2 hours if needed   saccharomyces boulardii 250 MG capsule Commonly known as: FLORASTOR Take 250 mg by mouth daily as needed (when taking antibiotics).   sodium chloride 0.65 % Soln nasal spray Commonly known as: OCEAN Place 1 spray into both nostrils as needed for congestion.   tamsulosin 0.4 MG Caps capsule Commonly known as: FLOMAX Take 0.4 mg by mouth daily after supper.   Testosterone Cypionate 200 MG/ML Soln Inject 100 Units into the muscle every  Tuesday.   venlafaxine XR 150 MG 24 hr capsule Commonly known as: EFFEXOR-XR Take 150 mg by mouth daily with breakfast.               Discharge Care Instructions  (From admission, onward)           Start     Ordered   06/12/21 0000  Discharge wound care:       Comments: It is good for closed incisions and even open wounds to be washed every day.  Shower every day.  Short baths are fine.  Wash the incisions and wounds clean with soap & water.    You may leave closed incisions open to air if it is dry.   You may cover the incision with clean gauze & replace it after your daily shower for comfort.  TEGADERM:  You have clear gauze band-aid dressings over your closed incision(s).  Remove the dressings 3 days after surgery.   06/12/21 1147            Significant Diagnostic Studies:  Results for orders  placed or performed during the hospital encounter of 06/12/21 (from the past 72 hour(s))  Type and screen [place]     Status: None   Collection Time: 06/12/21 10:30 AM  Result Value Ref Range   ABO/RH(D) A POS    Antibody Screen NEG    Sample Expiration      06/15/2021,2359 Performed at [facility], [place] [address] [place], [place] [address]   Surgical pathology     Status: None   Collection Time: 06/12/21 12:52 PM  Result Value Ref Range   SURGICAL PATHOLOGY      SURGICAL PATHOLOGY CASE: [id number] PATIENT: [name] Surgical Pathology Report     Clinical History: 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 COLON, SIGMOID, RESECTION: - Diverticulitis with abscess formation. - No dysplasia or malignancy.  B. FINAL DISTAL MARGIN: - Benign colon.   Teniqua Marron DESCRIPTION:  A.  Received fresh and subsequently placed in formalin labeled with the patient's name and "Sigmoid colon" is a 35.6 cm segment of colon with an average  circumference of 6.0 cm and up to 12.2 cm of attached mesocolon/mesorectum. One end is received open (indicating proximal, inked blue) while the opposite is stapled (indicating distal, inked orange). Located 5.5 cm from the proximal resection margin is a 3.9 x 2.3 x 2.0 cm indurated nodule comprised of thickened wall surrounding a cavity filled with fecal material and red, bloody goo. At the midportion is a stricture reduc ing the circumference to 4.5 cm with associated stiffening. Throughout the otherwise normally folded tan mucosa are a multitude of diverticular outpouchings, some of which extend through the wall into the fibrofatty tissue. A discrete mass is not grossly identified. Block Summary A1: proximal and distal margins A2-A3: proximal area of induration A4: stricture A5-A6: representative diverticula  B. Received fresh and subsequently placed in formalin labeled with the patient's name and "Final distal margin" is a 0.7 cm long, 2.0 cm in diameter mucosal donut that is grossly unremarkable. Representative perpendicular sections are submitted in cassette B1.  (LEF 06/13/2021)   Final Diagnosis performed by Vicente Males, MD.   Electronically signed 06/14/2021 Technical and / or Professional components performed at [facility], [place] [name] [place], [place] [address].  Immunohistochemistry Technical component (if applicable) was performed at [facility]. [address] [place], [place], [place] [address].   IMMUNOHISTOCHEMISTRY DISCLAIMER (if applicable): Some of these immunohistochemical stains may have been developed and the performance characteristics determine by [facility]. Some may not have been cleared or approved by the U.S. Food and Drug Administration. The FDA has determined that such clearance or approval is not necessary. This test is used for clinical purposes. It should not be regarded as investigational  or for research. This laboratory is certified under the [place] (CLIA-88) as qualified to perform high complexity clinical laboratory testing.  The controls stained appropriately.   Basic metabolic panel     Status: Abnormal   Collection Time: 06/13/21  4:12 AM  Result Value Ref Range   Sodium 135 135 - 145 mmol/L   Potassium 3.8 3.5 - 5.1 mmol/L   Chloride 102 98 - 111 mmol/L   CO2 28 22 - 32 mmol/L   Glucose, Bld 142 (H) 70 - 99 mg/dL    Comment: Glucose reference range applies only to samples taken after fasting for at  least 8 hours.   BUN 10 8 - 23 mg/dL   Creatinine, Ser 0.85 0.61 - 1.24 mg/dL   Calcium 8.3 (L) 8.9 - 10.3 mg/dL   GFR, Estimated >60 >60 mL/min    Comment: (NOTE) Calculated using the CKD-EPI Creatinine Equation (2021)    Anion gap 5 5 - 15    Comment: Performed at [facility], [place] [address] [place], [place] [address]  CBC     Status: Abnormal   Collection Time: 06/13/21  4:12 AM  Result Value Ref Range   WBC 13.4 (H) 4.0 - 10.5 K/uL   RBC 4.15 (L) 4.22 - 5.81 MIL/uL   Hemoglobin 13.6 13.0 - 17.0 g/dL   HCT 40.8 39.0 - 52.0 %   MCV 98.3 80.0 - 100.0 fL   MCH 32.8 26.0 - 34.0 pg   MCHC 33.3 30.0 - 36.0 g/dL   RDW 12.6 11.5 - 15.5 %   Platelets 150 150 - 400 K/uL   nRBC 0.0 0.0 - 0.2 %    Comment: Performed at [facility], [place] [address] [place], [place] [address]  Magnesium     Status: Abnormal   Collection Time: 06/13/21  4:12 AM  Result Value Ref Range   Magnesium 1.5 (L) 1.7 - 2.4 mg/dL    Comment: Performed at [facility], [place] [address], [place], [place] [address]    No results found.  Past Medical History:  Diagnosis Date   Allergic rhinitis    Anxiety    Arthritis    Asthma    Bladder tumor    Chronic fatigue    COVID-19 05/22/2021   Depression    06/30/17 Pt denies being depressed, reports Effexor is taken for Chronic Fatigue     Dyspnea    Enlarged prostate    Fibromyalgia    GERD (gastroesophageal reflux disease)    Headache    History of chronic bronchitis    History of migraine    History of toxic encephalopathy    Hypothyroidism    OSA on CPAP    CPAP 14   PAF (paroxysmal atrial fibrillation) ([place]) CARDIOLOGIST -- DR [name]   DX OCT 2013   S/P AVR (aortic valve replacement) and aortoplasty    Sensitiveness to light    Unspecified essential hypertension    Urethral tumor    PROSTATIC    Past Surgical History:  Procedure Laterality Date   BENTALL PROCEDURE N/A 07/02/2017   Procedure: BENTALL PROCEDURE;  Surgeon: Ivin Poot, MD;  Location: [facility];  Service: Open Heart Surgery;  Laterality: N/A;  WITH CIRC ARREST   BIOPSY  02/06/2021   Procedure: BIOPSY;  Surgeon: Clarene Essex, MD;  [facility];  Service: Endoscopy;;   COLONOSCOPY     COLONOSCOPY WITH PROPOFOL N/A 02/06/2021   Procedure: COLONOSCOPY WITH PROPOFOL;  Surgeon: Clarene Essex, MD;  [facility];  Service: Endoscopy;  Laterality: N/A;   CYSTOSCOPY W/ RETROGRADES Bilateral 06/15/2013   Procedure: CYSTOSCOPY WITH BILATERAL RETROGRADE PYELOGRAM  BLADDER BIOPSY, PROSTATIC URETHRAL BIOPSY, ;  Surgeon: Molli Hazard, MD;  Location: [facility];  Service: Urology;  Laterality: Bilateral;   ESOPHAGOGASTRODUODENOSCOPY (EGD) WITH PROPOFOL N/A 03/11/2018   Procedure: ESOPHAGOGASTRODUODENOSCOPY (EGD) WITH PROPOFOL;  Surgeon: Clarene Essex, MD;  Location: [facility];  Service: Endoscopy;  Laterality: N/A;   LAPAROSCOPIC CHOLECYSTECTOMY  01/14/2001   PROCTOSCOPY N/A 06/12/2021   Procedure: RIGID PROCTOSCOPY;  Surgeon: [name], MD;  Location: [facility];  Service: General;  Laterality: N/A;   RIGHT/LEFT HEART CATH AND CORONARY ANGIOGRAPHY N/A 06/12/2017   Procedure: RIGHT/LEFT HEART CATH AND CORONARY ANGIOGRAPHY;  Surgeon: Larey Dresser, MD;  Location: [facility];  Service: Cardiovascular;   Laterality: N/A;   TEE WITHOUT CARDIOVERSION N/A 07/02/2017   Procedure: TRANSESOPHAGEAL ECHOCARDIOGRAM (TEE);  Surgeon: Prescott Gum, Collier Salina, MD;  Location: [facility];  Service: Open Heart Surgery;  Laterality: N/A;   UMBILICAL HERNIA REPAIR  01/14/2008    Social History   Socioeconomic History   Marital status: Married    Spouse name: Not on file   Number of children: Not on file   Years of education: Not on file   Highest education level: Not on file  Occupational History   Occupation: disabled    Comment: owner of [employer]  Tobacco Use   Smoking status: Former    Packs/day: 0.50    Years: 27.00    Pack years: 13.50    Types: Cigarettes    Start date: 1970    Quit date: 10/21/1979    Years since quitting: 41.6   Smokeless tobacco: Never  Vaping Use   Vaping Use: Never used  Substance and Sexual Activity   Alcohol use: Yes    Alcohol/week: 4.0 standard drinks    Types: 4 Cans of beer per week    Comment: drinks "as much as I could" - 2-3 beers up to 8-10   Drug use: Never   Sexual activity: Not on file  Other Topics Concern   Not on file  Social History Narrative   Right handed   One story home   Drinks no caffeine   Social Determinants of Health   Financial Resource Strain: Not on file  Food Insecurity: Not on file  Transportation Needs: Not on file  Physical Activity: Not on file  Stress: Not on file  Social Connections: Not on file  Intimate Partner Violence: Not on file    Family History  Problem Relation Age of Onset   Aortic aneurysm Mother 81       cause of death   Other Father        motor vehicle accident   Heart disease Other        family history    Current Facility-Administered Medications  Medication Dose Route Frequency Provider Last Rate Last Admin   0.9 %  sodium chloride infusion  250 mL Intravenous PRN [name], MD       acetaminophen (TYLENOL) tablet 1,000 mg  1,000 mg Oral Q6H [name], MD   1,000 mg at 06/14/21 1139    albuterol (PROVENTIL) (2.5 MG/3ML) 0.083% nebulizer solution 3 mL  3 mL Inhalation Q6H PRN [name], MD       ALPRAZolam Duanne Moron) tablet 0.25 mg  0.25 mg Oral TID PRN [name], MD   0.25 mg at 06/14/21 0912   alum & mag hydroxide-simeth (MAALOX/MYLANTA) 200-200-20 MG/5ML suspension 30 mL  30 mL Oral Q6H PRN [name], MD       alvimopan (ENTEREG) capsule 12 mg  12 mg Oral BID [name], MD       azelastine (ASTELIN) 0.1 % nasal spray 1-2 spray  1-2 spray Each Nare BID PRN [name], MD       benzonatate (TESSALON) capsule 200 mg  200 mg Oral TID PRN [name], MD       calcium carbonate (TUMS - dosed in mg elemental calcium) chewable tablet 500-1,000 mg  500-1,000 mg Oral Daily PRN [name], MD       clobetasol ointment (TEMOVATE) [id number] % 1 application  1 application Topical BID PRN [name], MD       colchicine tablet 0.6 mg  0.6 mg Oral Daily [name], MD   0.6 mg at 06/14/21 0912   dextromethorphan-guaiFENesin ([place] DM) 30-600 MG per 12 hr tablet 1 tablet  1 tablet Oral BID PRN [name], MD       diclofenac Sodium (VOLTAREN) 1 % topical gel 2 g  2 g Topical Daily PRN [name], MD       diphenhydrAMINE (BENADRYL) 12.5 MG/5ML elixir 12.5 mg  12.5 mg Oral Q6H PRN [name], MD       Or   diphenhydrAMINE (BENADRYL) injection 12.5 mg  12.5 mg Intravenous Q6H PRN [name], MD   12.5 mg at 06/13/21 2014   enalaprilat (VASOTEC) injection 0.625-1.25 mg  0.625-1.25 mg Intravenous Q6H PRN [name], MD       enoxaparin (LOVENOX) injection 40 mg  40 mg Subcutaneous Q24H [name], MD   40 mg at 06/14/21 [id number]   EPINEPHrine (EPI-PEN) injection 0.3 mg  0.3 mg Intramuscular PRN [name], MD       feeding supplement (ENSURE SURGERY) liquid 237 mL  237 mL Oral BID BM [name], MD   237 mL at 06/14/21 0913   finasteride (PROSCAR) tablet 5 mg  5 mg Oral Ardeen Fillers, MD   5 mg at 06/13/21 2014   HYDROmorphone (DILAUDID) injection  0.5-2 mg  0.5-2 mg Intravenous Q4H PRN [name], MD   1 mg at 06/13/21 0532   lactated ringers bolus 1,000 mL  1,000 mL Intravenous Q8H PRN [name], MD       levothyroxine (SYNTHROID) tablet 112 mcg  112 mcg Oral QAC breakfast [name], MD   112 mcg at 06/14/21 [id number]   liothyronine (CYTOMEL) tablet 25 mcg  25 mcg Oral QAC breakfast [name], MD   25 mcg at 06/14/21 0913   lip balm (CARMEX) ointment 1 application  1 application Topical BID [name], MD   1 application at [id number]   losartan (COZAAR) tablet 50 mg  50 mg Oral Daily [name], MD   50 mg at 06/14/21 [id number]   magic mouthwash  15 mL Oral QID PRN [name], MD       melatonin tablet 3 mg  3 mg Oral QHS PRN [name], MD   3 mg at 06/13/21 0210   metoprolol tartrate (LOPRESSOR) injection 5 mg  5 mg Intravenous Q6H PRN [name], MD       metoprolol tartrate (LOPRESSOR) tablet 50 mg  50 mg Oral BID [name], MD   50 mg at 06/14/21 0913   multivitamin with minerals tablet 1 tablet  1 tablet Oral Daily [name], MD   1 tablet at 06/14/21 0913   naphazoline-glycerin (CLEAR EYES REDNESS) ophth solution 1-2 drop  1-2 drop Both Eyes QID PRN [name], MD       olopatadine (PATANOL) 0.1 % ophthalmic solution 1 drop  1 drop Both Eyes BID PRN [name], MD       ondansetron [facility]) tablet 4 mg  4 mg Oral Q6H PRN [name], MD       Or   ondansetron [facility]) injection 4 mg  4 mg Intravenous Q6H PRN [name], MD       oxyCODONE (Oxy IR/ROXICODONE) immediate release  tablet 5-10 mg  5-10 mg Oral Q4H PRN [name], MD   10 mg at 06/13/21 1958   potassium chloride SA (KLOR-CON) CR tablet 40 mEq  40 mEq Oral Daily [name], MD   40 mEq at 06/14/21 0912   simethicone (MYLICON) chewable tablet 40 mg  40 mg Oral Q6H PRN [name], MD   40 mg at 06/13/21 1115   sodium chloride (OCEAN) 0.65 % nasal spray 1 spray  1 spray Each Nare PRN [name], MD       sodium chloride  flush (NS) 0.9 % injection 3 mL  3 mL Intravenous Gorden Harms, MD   3 mL at 06/14/21 0914   sodium chloride flush (NS) 0.9 % injection 3 mL  3 mL Intravenous PRN [name], MD       SUMAtriptan (IMITREX) tablet 50-100 mg  50-100 mg Oral Q2H PRN [name], MD   50 mg at 06/14/21 0701   tamsulosin (FLOMAX) capsule 0.4 mg  0.4 mg Oral QPC supper [name], MD   0.4 mg at 06/13/21 1739   [START ON 06/18/2021] Testosterone Cypionate SOLN 100 Units  100 Units Intramuscular Q Gearldine Bienenstock, MD       venlafaxine XR (EFFEXOR-XR) 24 hr capsule 150 mg  150 mg Oral Daily [name], MD   150 mg at 06/14/21 [id number]     Allergies  Allergen Reactions   Perphenazine Swelling    Tongue swelling   Amiodarone Swelling   Codeine Itching   Tramadol Other (See Comments)    dellusion     Signed: Morton Peters, MD, FACS, MASCRS Esophageal, Gastrointestinal & Colorectal Surgery Robotic and Minimally Invasive Surgery  [facility], [place]  [place]. [address], [place], [place] [address] [phone] Fax [phone] Main  CONTACT INFORMATION:  Weekday (9AM-5PM): Call [facility] main office at [phone]  Weeknight (5PM-9AM) or Weekend/Holiday: Check [email or web address] (password " [email or web address]") for General Surgery [facility] coverage  (Please, do not use SecureChat as it is not reliable communication to operating surgeons for immediate patient care)      06/14/2021, 11:41 AM

## 2021-06-14 NOTE — Progress Notes (Signed)
Discharge instructions discussed with patient and family, verbalized agreement and understanding 

## 2021-06-14 NOTE — Care Management Important Message (Signed)
Medicare IM printed for [employer] to give to the patient. 

## 2021-07-09 ENCOUNTER — Other Ambulatory Visit: Payer: Self-pay

## 2021-07-09 ENCOUNTER — Encounter: Payer: Self-pay | Admitting: Neurology

## 2021-07-09 ENCOUNTER — Other Ambulatory Visit: Payer: Medicare Other

## 2021-07-09 ENCOUNTER — Ambulatory Visit: Payer: Medicare Other | Admitting: Neurology

## 2021-07-09 VITALS — BP 146/75 | HR 54 | Ht 70.0 in | Wt 219.6 lb

## 2021-07-09 DIAGNOSIS — J45909 Unspecified asthma, uncomplicated: Secondary | ICD-10-CM | POA: Diagnosis not present

## 2021-07-09 DIAGNOSIS — R058 Other specified cough: Secondary | ICD-10-CM

## 2021-07-09 DIAGNOSIS — R253 Fasciculation: Secondary | ICD-10-CM | POA: Diagnosis not present

## 2021-07-09 DIAGNOSIS — R071 Chest pain on breathing: Secondary | ICD-10-CM | POA: Diagnosis not present

## 2021-07-09 DIAGNOSIS — R259 Unspecified abnormal involuntary movements: Secondary | ICD-10-CM

## 2021-07-09 DIAGNOSIS — E039 Hypothyroidism, unspecified: Secondary | ICD-10-CM | POA: Diagnosis not present

## 2021-07-09 DIAGNOSIS — D509 Iron deficiency anemia, unspecified: Secondary | ICD-10-CM | POA: Diagnosis not present

## 2021-07-09 MED ORDER — RIZATRIPTAN BENZOATE 10 MG PO TABS: 10.0000 mg | ORAL_TABLET | ORAL | 11 refills | Status: DC | PRN

## 2021-07-09 MED ORDER — AIMOVIG 140 MG/ML ~~LOC~~ SOAJ: 1.0000 | SUBCUTANEOUS | 11 refills | Status: DC

## 2021-07-09 NOTE — Progress Notes (Signed)
NEUROLOGY FOLLOW UP OFFICE NOTE  Grant [name] [id number] [birth year]  HISTORY OF PRESENT ILLNESS: I had the pleasure of seeing Grant [name] in follow-up in the neurology clinic on 07/09/2021.  The patient was last seen 6 months ago for migraines and new onset involuntary movements that started around 11/2020 where he would have jerking/twitching movements, more on the right. He would be waving his right arm or constantly move his right leg. When using his mouse or cellphone, he could not put the cursor on or his hand would jerk like he could not control it. He was also having tongue movements. CMP and ammonia were normal. He had an MRI brain without contrast in 12/2020 which did not show any acute changes, there was mild chronic microvascular disease in the right paracentral pons, stable chronic microhemorrhage in the left frontal lobe. His wake and sleep EEG in 12/2020 was normal.  Since his last visit, he has been dealing with other medical issues. He had colon surgery last 06/12/21 with robotic colectomy and lysis of adhesions, drainage of abscess. He did well with the surgery but this "kicked his chronic fatigue into overdrive." On his last visit, he was also having more migraines, they are a lot better now. He was having more migraines prior to the surgery but not full blown because he would take rizatriptan with good effect. They are getting better with recognizing when to give medication to abort migraines. He is on Aimovig every month for migraine prophylaxis. The abnormal movements have also quieted down. He is not having the tongue protrusions any more, but continues to have rapid chewing movements. If he is tired or anxious, he chews more. His wife shows a video where he is able to talk during them, no staring/unresponsiveness. He is not flinging the right arm anymore but still has a lot of leg movements that she would have to hold him down sometimes. He is still knocking things over, has  difficulty with fine movements, and slams things down instead of putting things down slowly. When he is lying in bed, his wife feels his muscles are constantly contracting. There is no family history of similar symptoms. His weight loss has plateaued. No falls. He takes Xanax 1-2 times a day, there are days he does not need it. He feels it does not seem to do much for his nervousness, but his wife feels it helps. He has had 2-3 episodes of sudden dizziness/spinning since his last visit, the last time his wife found him in his workshop with his head between his legs. Sleep is good now.   History on Initial Assessment 03/02/2019: This is a 70 year old right-handed man with a history of OSA, atrial fibrillation on chronic anticoagulation, aortic valve replacement, depression, chronic fatigue syndrome/fibromyalgia, presenting after hospital admission last 02/16/2019 for altered mental status. He was in his usual state of health ("a little unusual all the time anyway"), working in his shop when he stopped doing one thing and could not remember what he was going to do next. As this was going on, his wife came to the shop and he does not remember much until they were riding in the car to the hospital. His wife reports that he has had baseline confusion with his chronic fatigue syndrome for more than 15 years, worse when he is tired, but this day was unusual because he was looking all over the place not knowing what he was doing, and he kept asking her the same  thing repeatedly. He would usually argue when he goes to the doctor, but this time did not argue, he was not as cognizant of how different/serious it was while he was in the hospital. His wife reported that he had been "bouncing off the edge of a migraine daily for 2-3 weeks prior." He was taking a lot of Tylenol, Aleve, and Maxalt. It appears he was back to baseline in the ER. Bloodwork was normal, UDS and EtOH level negative. I personally reviewed MRI/MRA brain  which did not show any acute changes. There was mild chronic microvascular disease. There was scattered small vessel atherosclerotic change without significant stenosis. There was an incidental finding of a 88mm focal outpouching at the left MCA bifurcation, suspicious for aneurysm. His wake and drowsy EEG was normal. Symptoms felt due to transient global amnesia or migraine. No further recurrence of similar significant confusion.  They report a complicated history since his early 68s which has been diagnosed as chronic fatigue syndrome (CFS). He takes Effexor. His wife states it has been more than 15 years where he has been confused and kind of forgetful. 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 He would have days where he speaks and thinks better, figuring out things faster, usually when he has an adrenaline burst, then would crash for a couple of days. If he gets overstimulated or surprised by a loud sound or too much information, he would "start going somewhere else" and get confused. He got lost in a store one time. It can last 1-2 hours or a whole day before he is back to his usual self. 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 He has had migraines since childhood, usually with stabbing pain in the left temporal or retroorbital regions. If he does not stop them, they may go on all day. He would have tunnel vision and see black shimmering objects, with nausea and light sensitivity, tunnel vision. His left eye would almost shut. He has been taking Maxalt every other day for the past few weeks. He has never been on a preventative medication. He took perphenazine in the past but it caused tongue swelling. He took it a week or so ago and his tongue swelled up again. They report balance issues for the past 1.5 years, he would have extreme fatigue where he  feels like he would pass out, occasional vertical diplopia. He was in the hospital while visiting the beach in 08/2018. They fell since his cardiac surgery in 2018, "more funny things have been going on since then." He does not drive. His mother and maternal grandmother had migraines. He denies any focal numbness/tingling/weakness, dysphagia. Sleep is good with CPAP machine.    PAST MEDICAL HISTORY: Past Medical History:  Diagnosis Date   Allergic rhinitis    Anxiety    Arthritis    Asthma    Bladder tumor    Chronic fatigue    COVID-19 05/22/2021   Depression    06/30/17 Pt denies being depressed, reports Effexor is taken for Chronic Fatigue    Dyspnea    Enlarged prostate    Fibromyalgia    GERD (gastroesophageal reflux disease)    Headache    History of chronic bronchitis    History of migraine    History of toxic encephalopathy    Hypothyroidism    OSA on  CPAP    CPAP 14   PAF (paroxysmal atrial fibrillation) (HCC) CARDIOLOGIST -- DR [name]   DX OCT 2013   S/P AVR (aortic valve replacement) and aortoplasty    Sensitiveness to light    Unspecified essential hypertension    Urethral tumor    PROSTATIC    MEDICATIONS: Current Outpatient Medications on File Prior to Visit  Medication Sig Dispense Refill   albuterol (VENTOLIN HFA) 108 (90 Base) MCG/ACT inhaler INHALE 2 PUFFS INTO THE LUNGS EVERY 6 HOURS AS NEEDED FOR WHEEZING OR SHORTNESS OF BREATH (Patient taking differently: Inhale 2 puffs into the lungs every 6 (six) hours as needed for shortness of breath.) 18 g 5   ALPRAZolam (XANAX) 0.25 MG tablet Take 1 tablet (0.25 mg total) by mouth 3 (three) times daily as needed for anxiety. 15 tablet 0   aluminum hydroxide-magnesium carbonate (GAVISCON) 95-358 MG/15ML SUSP Take 15 mLs by mouth as needed for indigestion or heartburn.     amoxicillin (AMOXIL) 500 MG capsule Take 4 capsules (2,000 mg total) by mouth See admin instructions. 1 hour prior to dental procedure 8 capsule 5    azelastine (ASTELIN) 137 MCG/SPRAY nasal spray Place 1-2 sprays into the nose 2 (two) times daily as needed for rhinitis or allergies.      benzonatate (TESSALON) 200 MG capsule Take 1 capsule (200 mg total) by mouth 3 (three) times daily as needed for cough. 60 capsule 3   budesonide-formoterol (SYMBICORT) 160-4.5 MCG/ACT inhaler Inhale 2 puffs into the lungs in the morning and at bedtime. 1 each 5   calcium carbonate (TUMS - DOSED IN MG ELEMENTAL CALCIUM) 500 MG chewable tablet Chew 500-1,000 mg by mouth daily as needed for indigestion or heartburn.     chlorpheniramine (CHLOR-TRIMETON) 4 MG tablet Take 4 mg by mouth every 6 (six) hours as needed for allergies.     COLCRYS 0.6 MG tablet Take 0.6 mg by mouth in the morning.     Dextromethorphan-guaiFENesin (MUCINEX DM MAXIMUM STRENGTH) 60-1200 MG TB12 Take 1 tablet by mouth 2 (two) times daily as needed (congestion/cough).     diclofenac Sodium (VOLTAREN) 1 % GEL Apply 2 g topically daily as needed (pain).     diphenhydrAMINE (BENADRYL) 25 MG tablet Take 50 mg by mouth daily as needed for allergies.     EPINEPHrine 0.3 mg/0.3 mL IJ SOAJ injection Inject 0.3 mLs (0.3 mg total) into the muscle as needed (for angioedema). 2 Device 0   Erenumab-aooe (AIMOVIG) 140 MG/ML SOAJ Inject 140 mg into the skin every 28 (twenty-eight) days. 1 mL 11   Ferrous Sulfate (IRON) 142 (45 Fe) MG TBCR Take 45 mg by mouth in the morning and at bedtime.     finasteride (PROSCAR) 5 MG tablet Take 5 mg by mouth at bedtime.      levothyroxine (SYNTHROID, LEVOTHROID) 112 MCG tablet Take 112 mcg by mouth daily before breakfast.      liothyronine (CYTOMEL) 25 MCG tablet Take 25 mcg by mouth daily before breakfast.     losartan (COZAAR) 50 MG tablet TAKE 1 TABLET BY MOUTH EVERY DAY (Patient taking differently: Take 50 mg by mouth daily.) 30 tablet 6   magnesium oxide (MAG-OX) 400 MG tablet Take 400 mg by mouth daily.     Menthol, Topical Analgesic, (BLUE-EMU MAXIMUM STRENGTH  EX) Apply 1 application topically as needed (pain).     Methylcellulose, Laxative, (FIBER THERAPY) 500 MG TABS Take 500-1,000 mg by mouth daily.     metoprolol tartrate (  LOPRESSOR) 50 MG tablet Take 1 tablet (50 mg total) by mouth 2 (two) times daily. 180 tablet 3   Multiple Vitamin (MULTIVITAMIN WITH MINERALS) TABS tablet Take 1 tablet by mouth daily.     NON FORMULARY Pt uses a cpap nightly     olopatadine (PATANOL) 0.1 % ophthalmic solution Place 1 drop into both eyes 2 (two) times daily as needed for allergies.     omeprazole (PRILOSEC) 40 MG capsule Take 1 capsule (40 mg total) by mouth 2 (two) times daily. 180 capsule 1   rivaroxaban (XARELTO) 20 MG TABS tablet Take 1 tablet (20 mg total) by mouth daily with supper. 90 tablet 3   rizatriptan (MAXALT) 10 MG tablet Take 1 tablet (10 mg total) by mouth as needed for migraine. May repeat in 2 hours if needed 10 tablet 11   saccharomyces boulardii (FLORASTOR) 250 MG capsule Take 250 mg by mouth daily as needed (when taking antibiotics).     sodium chloride (OCEAN) 0.65 % SOLN nasal spray Place 1 spray into both nostrils as needed for congestion.     tamsulosin (FLOMAX) 0.4 MG CAPS capsule Take 0.4 mg by mouth daily after supper.     Testosterone Cypionate 200 MG/ML SOLN Inject 100 Units into the muscle every Tuesday.     venlafaxine (EFFEXOR-XR) 150 MG 24 hr capsule Take 150 mg by mouth daily with breakfast.     No current facility-administered medications on file prior to visit.    ALLERGIES: Allergies  Allergen Reactions   Perphenazine Swelling    Tongue swelling   Amiodarone Swelling   Codeine Itching   Tramadol Other (See Comments)    dellusion     FAMILY HISTORY: Family History  Problem Relation Age of Onset   Aortic aneurysm Mother 29       cause of death   Other Father        motor vehicle accident   Heart disease Other        family history    SOCIAL HISTORY: Social History   Socioeconomic History   Marital  status: Married    Spouse name: Not on file   Number of children: Not on file   Years of education: Not on file   Highest education level: Not on file  Occupational History   Occupation: disabled    Comment: owner of buisness  Tobacco Use   Smoking status: Former    Packs/day: 0.50    Years: 27.00    Pack years: 13.50    Types: Cigarettes    Start date: 1970    Quit date: 10/21/1979    Years since quitting: 41.7   Smokeless tobacco: Never  Vaping Use   Vaping Use: Never used  Substance and Sexual Activity   Alcohol use: Yes    Alcohol/week: 4.0 standard drinks    Types: 4 Cans of beer per week    Comment: drinks "as much as I could" - 2-3 beers up to 8-10   Drug use: Never   Sexual activity: Not on file  Other Topics Concern   Not on file  Social History Narrative   Right handed   One story home   Drinks no caffeine   Social Determinants of Health   Financial Resource Strain: Not on file  Food Insecurity: Not on file  Transportation Needs: Not on file  Physical Activity: Not on file  Stress: Not on file  Social Connections: Not on file  Intimate Partner Violence: Not on  file     PHYSICAL EXAM: Vitals:   07/09/21 0810  BP: (!) 146/75  Pulse: (!) 54  SpO2: 98%   General: No acute distress, wearing sunglasses in the office due to headache Head:  Normocephalic/atraumatic Skin/Extremities: No rash, no edema Neurological Exam: alert and awake. No aphasia or dysarthria. Fund of knowledge is appropriate.  Attention and concentration are normal.   Cranial nerves: Pupils equal, round. Extraocular movements intact with no nystagmus. Visual fields full.  No facial asymmetry.  Motor: Bulk and tone normal, no cogwheeling, muscle strength 5/5 throughout with no pronator drift.   Finger to nose testing intact.  Gait narrow-based, good arm swing, no ataxia. He does not have any abnormal movements on exam today.    IMPRESSION: This is a 70 yo RH man with a history of OSA,  atrial fibrillation on chronic anticoagulation, aortic valve replacement, depression, chronic fatigue syndrome/fibromyalgia, who had an episode of confusion last April 2020 and December 2020. MRI brain and EEG unremarkable. Differentials include complicated migraines, TGA, versus seizure, he has had an increase in headaches around the time of both episodes. No further confusional episodes, migraines overall stable on Aimovig and prn rizatriptan. He had new onset abnormal involuntary movements that started in 11/2020, mostly on the right side. MRI brain normal. A lot of the movements (tongue protrusion, right arm flinging) have quieted down, but he still has repeated chewing movements and leg movements. His wife also notes muscle contractions. EMG/NCV of the right arm and leg will be ordered. Bloodwork for CK, aldolase, chorea workup will be ordered. The movements appear to occur when he is more anxious, continue management of anxiety with PCP. Follow-up in 6 months, they know to call for any changes.    Thank you for allowing me to participate in his care.  Please do not hesitate to call for any questions or concerns.    Ellouise Newer, M.D.   CC: Dr. Kenton Kingfisher

## 2021-07-09 NOTE — Patient Instructions (Addendum)
Bloodwork for CK, aldolase, ceruloplasmin, copper, PTH, ferritin, sedimentation rate, ANA, antiphospholipid antibody, lupus anticoagulant, RPR  2. Schedule EMG/NCV of the right upper and lower extremities  3. Continue all your medications  4. Follow-up in 6 months, call for any changes

## 2021-07-19 ENCOUNTER — Telehealth: Payer: Self-pay

## 2021-07-19 NOTE — Telephone Encounter (Signed)
Spoke with Juliann Pulse pt wife informed her that lab work was normal, and that PCP had ordered testosterone level and it was low to follow up with PCP. Pt wife stated pt had not taken his testosterone that day and PCP is aware they new it would be low,

## 2021-07-19 NOTE — Telephone Encounter (Signed)
-----   Message from Cameron Sprang, MD sent at 07/19/2021 10:25 AM EDT ----- Pls let wife know I got the bloodwork results, the bloodwork I ordered was all normal. His PCP ordered testosterone level which was low, discuss with PCP. Thanks!  ----- Message ----- From: Leeroy Bock Sent: 07/18/2021   1:04 PM EDT To: Cameron Sprang, MD

## 2021-07-30 ENCOUNTER — Other Ambulatory Visit: Payer: Self-pay

## 2021-07-30 DIAGNOSIS — R202 Paresthesia of skin: Secondary | ICD-10-CM

## 2021-08-01 ENCOUNTER — Ambulatory Visit: Payer: Medicare Other | Admitting: Neurology

## 2021-08-01 ENCOUNTER — Other Ambulatory Visit: Payer: Self-pay

## 2021-08-01 DIAGNOSIS — R202 Paresthesia of skin: Secondary | ICD-10-CM | POA: Diagnosis not present

## 2021-08-01 DIAGNOSIS — G5621 Lesion of ulnar nerve, right upper limb: Secondary | ICD-10-CM

## 2021-08-01 NOTE — Procedures (Signed)
[facility]  [place], [place]  [place], [place] [address] Tel: [phone] Fax:  [phone] Test Date:  08/01/2021  Patient: Grant [name] DOB: 1951-08-21 Physician: Narda Amber, DO  Sex: Male Height: 5\' 10"  Ref Phys: Ellouise Newer, M.D.  ID#: [id number]   Technician:    Patient Complaints: This is a 70 year old man referred for evaluation of muscle twitches.  NCV & EMG Findings: Extensive electrodiagnostic testing of the right upper and lower extremity shows:  Right median, ulnar, mixed palmar, sural, and superficial peroneal sensory responses are within normal limits. Right ulnar motor response shows conduction velocity slowing across the elbow (A Elbow-B Elbow, 34 m/s).  Right median, peroneal, and tibial motor responses are within normal limits.   Right tibial H reflex study is within normal limits.   Chronic motor axonal loss changes are seen affecting the ulnar innervated muscles, without accompanying active denervation.  There is no evidence of active or chronic motor axonal changes affecting any of the tested muscles in the right lower extremity.    Impression: Right ulnar neuropathy with slowing across the elbow, purely demyelinating, mild. There is no evidence of a sensorimotor polyneuropathy or cervical/lumbosacral radiculopathy affecting the right side.   ___________________________ Narda Amber, DO    Nerve Conduction Studies Anti Sensory Summary Table   Stim Site NR Peak (ms) Norm Peak (ms) P-T Amp (V) Norm P-T Amp  Right Median Anti Sensory (2nd Digit)  33C  Wrist    3.5 <3.8 21.8 >10  Right Sup Peroneal Anti Sensory (Ant Lat Mall)  33C  12 cm    3.5 <4.6 5.9 >3  Right Sural Anti Sensory (Lat Mall)  33C  Calf    2.8 <4.6 8.3 >3  Right Ulnar Anti Sensory (5th Digit)  33C  Wrist    3.1 <3.2 12.1 >5   Motor Summary Table   Stim Site NR Onset (ms) Norm Onset (ms) O-P Amp (mV) Norm O-P Amp Site1 Site2 Delta-0 (ms) Dist (cm) Vel  (m/s) Norm Vel (m/s)  Right Median Motor (Abd Poll Brev)  33C  Wrist    3.4 <4.0 9.6 >5 Elbow Wrist 5.6 29.0 52 >50  Elbow    9.0  9.4         Right Peroneal Motor (Ext Dig Brev)  33C  Ankle    3.5 <6.0 5.9 >2.5 B Fib Ankle 8.4 35.0 42 >40  B Fib    11.9  5.7  Poplt B Fib 2.2 9.0 41 >40  Poplt    14.1  5.6         Right Tibial Motor (Abd Hall Brev)  33C  Ankle    5.6 <6.0 5.6 >4 Knee Ankle 9.9 41.0 41 >40  Knee    15.5  4.0         Right Ulnar Motor (Abd Dig Minimi)  33C  Wrist    2.3 <3.1 8.4 >7 B Elbow Wrist 3.9 22.0 56 >50  B Elbow    6.2  8.1  A Elbow B Elbow 2.9 10.0 34 >50  A Elbow    9.1  8.0          Comparison Summary Table   Stim Site NR Peak (ms) Norm Peak (ms) P-T Amp (V) Site1 Site2 Delta-P (ms) Norm Delta (ms)  Right Median/Ulnar Palm Comparison (Wrist - 8cm)  33C  Median Palm    2.1 <2.2 28.9 Median Palm Ulnar Palm 0.3   Ulnar Palm    1.8 <  2.2 6.0       H Reflex Studies   NR H-Lat (ms) Lat Norm (ms) L-R H-Lat (ms)  Right Tibial (Gastroc)  33C     34.01 <35    EMG   Side Muscle Ins Act Fibs Psw Fasc Number Recrt Dur Dur. Amp Amp. Poly Poly. Comment  Right AntTibialis Nml Nml Nml Nml Nml Nml Nml Nml Nml Nml Nml Nml N/A  Right Gastroc Nml Nml Nml Nml Nml Nml Nml Nml Nml Nml Nml Nml N/A  Right RectFemoris Nml Nml Nml Nml Nml Nml Nml Nml Nml Nml Nml Nml N/A  Right 1stDorInt Nml Nml Nml Nml 1- Rapid Some 1+ Some 1+ Some 1+ N/A  Right PronatorTeres Nml Nml Nml Nml Nml Nml Nml Nml Nml Nml Nml Nml N/A  Right Biceps Nml Nml Nml Nml Nml Nml Nml Nml Nml Nml Nml Nml N/A  Right Triceps Nml Nml Nml Nml Nml Nml Nml Nml Nml Nml Nml Nml N/A  Right Deltoid Nml Nml Nml Nml Nml Nml Nml Nml Nml Nml Nml Nml N/A  Right FlexCarpiUln Nml Nml Nml Nml 1- Rapid Some 1+ Some 1+ Some 1+ N/A      Waveforms:

## 2021-08-08 ENCOUNTER — Telehealth: Payer: Self-pay

## 2021-08-08 NOTE — Telephone Encounter (Signed)
-----   Message from Cameron Sprang, MD sent at 08/06/2021  4:39 PM EDT ----- Pls let wife know the nerve and muscle test did not show any muscle disorder. There is mild pinching of the nerve at the right elbow but this would not cause his symptoms. Thanks

## 2021-08-08 NOTE — Telephone Encounter (Signed)
Spoke with pt wife and informed her nerve and muscle test did not show any muscle disorder. There is mild pinching of the nerve at the right elbow but this would not cause his symptoms

## 2021-08-20 ENCOUNTER — Ambulatory Visit (INDEPENDENT_AMBULATORY_CARE_PROVIDER_SITE_OTHER): Payer: Medicare Other

## 2021-08-20 ENCOUNTER — Other Ambulatory Visit: Payer: Self-pay

## 2021-08-20 ENCOUNTER — Ambulatory Visit: Payer: Medicare Other | Admitting: Adult Health

## 2021-08-20 ENCOUNTER — Encounter: Payer: Self-pay | Admitting: Adult Health

## 2021-08-20 VITALS — BP 124/86 | HR 58 | Ht 70.0 in | Wt 231.6 lb

## 2021-08-20 DIAGNOSIS — R053 Chronic cough: Secondary | ICD-10-CM | POA: Diagnosis not present

## 2021-08-20 DIAGNOSIS — J45909 Unspecified asthma, uncomplicated: Secondary | ICD-10-CM | POA: Diagnosis not present

## 2021-08-20 DIAGNOSIS — J40 Bronchitis, not specified as acute or chronic: Secondary | ICD-10-CM | POA: Diagnosis not present

## 2021-08-20 MED ORDER — BENZONATATE 200 MG PO CAPS: 200.0000 mg | ORAL_CAPSULE | Freq: Three times a day (TID) | ORAL | 3 refills | Status: DC | PRN

## 2021-08-20 NOTE — Assessment & Plan Note (Signed)
Recent flare-continue control for triggers and cough control regimen Check chest x-ray today.  Hold on steroids for now.  Plan  . Patient Instructions  Chest xray today  Continue on Symbicort 160 2 puffs Twice daily  , rinse after use.  Stop Mucinex DM  Begin Robitussin DM 2 tsp every 4hrs for cough as needed  Sips of water to soothe throat and avoid throat clearing .  Tessalon Three times a day  As needed  Cough .  Chlorpheniramine 4mg  At bedtime  for drainage and cough .  Follow up with Dr. Melvyn Novas in 6-8 weeks and As needed   Please contact office for sooner follow up if symptoms do not improve or worsen or seek emergency care

## 2021-08-20 NOTE — Patient Instructions (Addendum)
Chest xray today  Continue on Symbicort 160 2 puffs Twice daily  , rinse after use.  Stop Mucinex DM  Begin Robitussin DM 2 tsp every 4hrs for cough as needed  Sips of water to soothe throat and avoid throat clearing .  Tessalon Three times a day  As needed  Cough .  Chlorpheniramine 4mg  At bedtime  for drainage and cough .  Follow up with Dr. Melvyn Novas in 6-8 weeks and As needed   Please contact office for sooner follow up if symptoms do not improve or worsen or seek emergency care

## 2021-08-20 NOTE — Assessment & Plan Note (Signed)
Cough control regimen   Plan  Patient Instructions  Chest xray today  Continue on Symbicort 160 2 puffs Twice daily  , rinse after use.  Stop Mucinex DM  Begin Robitussin DM 2 tsp every 4hrs for cough as needed  Sips of water to soothe throat and avoid throat clearing .  Tessalon Three times a day  As needed  Cough .  Chlorpheniramine 4mg  At bedtime  for drainage and cough .  Follow up with Dr. Melvyn Novas in 6-8 weeks and As needed   Please contact office for sooner follow up if symptoms do not improve or worsen or seek emergency care

## 2021-08-20 NOTE — Progress Notes (Signed)
Patient was called and notified about results.

## 2021-08-20 NOTE — Progress Notes (Signed)
@Patient  ID: Grant [name], male    DOB: 19-Jan-1951, 70 y.o.   MRN: [id number]  Chief Complaint  Patient presents with   Follow-up    Referring provider: Shirline Frees, MD  HPI: 70 year old male former smoker followed for asthma and chronic bronchitis, allergic rhinitis and obstructive sleep apnea, chronic cough Medical history significant for A. fib on chronic anticoagulation, depression, angioedema, aortic valve replacement, toxic encephalopathy, chronic fatigue with fibromyalgia  TEST/EVENTS :  PFT 06/01/17-WNL-FVC 4.46/96%, FEV1 3.71/106%, ratio 0.83, FEF 25-75% 4.64/167%, no response to BD , TLC 107%, DLCO 101%    NPSG 03/24/03- AHI 88/ hr, desaturation to 75%, body weight 244 lbs  08/20/2021 Follow up : Asthma, Chronic cough , OSA  Patient returns for a 3 -month follow-up.  Patient has underlying asthma and chronic cough. Patient was seen last office visit for an asthma bronchitis exacerbation.  Was treated with a steroid injection. Took 10 days course of Amoxicillin . He remains on Symbicort twice daily. Returns today with only minimum improvement in symptoms.  Main complaint is he has severe coughing paroxysms that wax and wane.  Has been using Mucinex DM for cough control.  Has also been using chlor tabs 4 mg twice daily however the daytime dose of chlor tabs is causing significant sleepiness.  Continues to have ongoing fatigue low energy and anxiety.  Patient has underlying sleep apnea.  Is on CPAP at bedtime.  Patient says he wears a CPAP every night cannot sleep without it.  CPAP download shows 100% compliance.  Daily average usage at 10 hours.  Patient is on CPAP 12 cm H2O.  AHI is 2.1/hour    Allergies  Allergen Reactions   Perphenazine Swelling    Tongue swelling   Amiodarone Swelling   Codeine Itching   Tramadol Other (See Comments)    dellusion     Immunization History  Administered Date(s) Administered   Fluad Quad(high Dose 65+) 07/07/2019   Influenza,  High Dose Seasonal PF 06/25/2018, 07/23/2021   Influenza,inj,Quad PF,6+ Mos 10/31/2016, 07/12/2017   Influenza-Unspecified 07/12/2020   PFIZER(Purple Top)SARS-COV-2 Vaccination 11/26/2019, 12/21/2019, 07/12/2020, 04/02/2021, 07/23/2021   Pneumococcal Conjugate-13 06/25/2018   Pneumococcal Polysaccharide-23 07/12/2017   Zoster, Live 10/01/2020    Past Medical History:  Diagnosis Date   Allergic rhinitis    Anxiety    Arthritis    Asthma    Bladder tumor    Chronic fatigue    COVID-19 05/22/2021   Depression    06/30/17 Pt denies being depressed, reports Effexor is taken for Chronic Fatigue    Dyspnea    Enlarged prostate    Fibromyalgia    GERD (gastroesophageal reflux disease)    Headache    History of chronic bronchitis    History of migraine    History of toxic encephalopathy    Hypothyroidism    OSA on CPAP    CPAP 14   PAF (paroxysmal atrial fibrillation) ([place]) CARDIOLOGIST -- DR [name]   DX OCT 2013   S/P AVR (aortic valve replacement) and aortoplasty    Sensitiveness to light    Unspecified essential hypertension    Urethral tumor    PROSTATIC    Tobacco History: Social History   Tobacco Use  Smoking Status Former   Packs/day: 0.50   Years: 27.00   Pack years: 13.50   Types: Cigarettes   Start date: 94   Quit date: 10/21/1979   Years since quitting: 41.8  Smokeless Tobacco Never   Counseling given: Not  Answered   Outpatient Medications Prior to Visit  Medication Sig Dispense Refill   albuterol (VENTOLIN HFA) 108 (90 Base) MCG/ACT inhaler INHALE 2 PUFFS INTO THE LUNGS EVERY 6 HOURS AS NEEDED FOR WHEEZING OR SHORTNESS OF BREATH (Patient taking differently: Inhale 2 puffs into the lungs every 6 (six) hours as needed for shortness of breath.) 18 g 5   ALPRAZolam (XANAX) 0.25 MG tablet Take 1 tablet (0.25 mg total) by mouth 3 (three) times daily as needed for anxiety. 15 tablet 0   aluminum hydroxide-magnesium carbonate (GAVISCON) 95-358 MG/15ML SUSP Take  15 mLs by mouth as needed for indigestion or heartburn.     amoxicillin (AMOXIL) 500 MG capsule Take 4 capsules (2,000 mg total) by mouth See admin instructions. 1 hour prior to dental procedure 8 capsule 5   azelastine (ASTELIN) 137 MCG/SPRAY nasal spray Place 1-2 sprays into the nose 2 (two) times daily as needed for rhinitis or allergies.      budesonide-formoterol (SYMBICORT) 160-4.5 MCG/ACT inhaler Inhale 2 puffs into the lungs in the morning and at bedtime. 1 each 5   calcium carbonate (TUMS - DOSED IN MG ELEMENTAL CALCIUM) 500 MG chewable tablet Chew 500-1,000 mg by mouth daily as needed for indigestion or heartburn.     chlorpheniramine (CHLOR-TRIMETON) 4 MG tablet Take 4 mg by mouth every 6 (six) hours as needed for allergies.     COLCRYS 0.6 MG tablet Take 0.6 mg by mouth in the morning.     Dextromethorphan-guaiFENesin (MUCINEX DM MAXIMUM STRENGTH) 60-1200 MG TB12 Take 1 tablet by mouth 2 (two) times daily as needed (congestion/cough).     diclofenac Sodium (VOLTAREN) 1 % GEL Apply 2 g topically daily as needed (pain).     diphenhydrAMINE (BENADRYL) 25 MG tablet Take 50 mg by mouth daily as needed for allergies.     EPINEPHrine 0.3 mg/0.3 mL IJ SOAJ injection Inject 0.3 mLs (0.3 mg total) into the muscle as needed (for angioedema). 2 Device 0   Erenumab-aooe (AIMOVIG) 140 MG/ML SOAJ Inject 140 mg into the skin every 28 (twenty-eight) days. 1 mL 11   Ferrous Sulfate (IRON) 142 (45 Fe) MG TBCR Take 45 mg by mouth in the morning and at bedtime.     finasteride (PROSCAR) 5 MG tablet Take 5 mg by mouth at bedtime.      levothyroxine (SYNTHROID, LEVOTHROID) 112 MCG tablet Take 112 mcg by mouth daily before breakfast.      liothyronine (CYTOMEL) 25 MCG tablet Take 25 mcg by mouth daily before breakfast.     losartan (COZAAR) 50 MG tablet TAKE 1 TABLET BY MOUTH EVERY DAY (Patient taking differently: Take 50 mg by mouth daily.) 30 tablet 6   magnesium oxide (MAG-OX) 400 MG tablet Take 400 mg by  mouth daily.     Menthol, Topical Analgesic, (BLUE-EMU MAXIMUM STRENGTH EX) Apply 1 application topically as needed (pain).     Methylcellulose, Laxative, (FIBER THERAPY) 500 MG TABS Take 500-1,000 mg by mouth daily.     metoprolol tartrate (LOPRESSOR) 50 MG tablet Take 1 tablet (50 mg total) by mouth 2 (two) times daily. 180 tablet 3   Multiple Vitamin (MULTIVITAMIN WITH MINERALS) TABS tablet Take 1 tablet by mouth daily.     NON FORMULARY Pt uses a cpap nightly     olopatadine (PATANOL) 0.1 % ophthalmic solution Place 1 drop into both eyes 2 (two) times daily as needed for allergies.     omeprazole (PRILOSEC) 40 MG capsule Take 1 capsule (40  mg total) by mouth 2 (two) times daily. 180 capsule 1   rivaroxaban (XARELTO) 20 MG TABS tablet Take 1 tablet (20 mg total) by mouth daily with supper. 90 tablet 3   rizatriptan (MAXALT) 10 MG tablet Take 1 tablet (10 mg total) by mouth as needed for migraine. May repeat in 2 hours if needed 10 tablet 11   saccharomyces boulardii (FLORASTOR) 250 MG capsule Take 250 mg by mouth daily as needed (when taking antibiotics).     sodium chloride (OCEAN) 0.65 % SOLN nasal spray Place 1 spray into both nostrils as needed for congestion.     tamsulosin (FLOMAX) 0.4 MG CAPS capsule Take 0.4 mg by mouth daily after supper.     Testosterone Cypionate 200 MG/ML SOLN Inject 100 Units into the muscle every Tuesday.     venlafaxine (EFFEXOR-XR) 150 MG 24 hr capsule Take 150 mg by mouth daily with breakfast.     benzonatate (TESSALON) 200 MG capsule Take 1 capsule (200 mg total) by mouth 3 (three) times daily as needed for cough. 60 capsule 3   No facility-administered medications prior to visit.     Review of Systems:   Constitutional:   No  weight loss, night sweats,  Fevers, chills,  +fatigue, or  lassitude.  HEENT:   No headaches,  Difficulty swallowing,  Tooth/dental problems, or  Sore throat,                No sneezing, itching, ear ache, + nasal congestion,  post nasal drip,   CV:  No chest pain,  Orthopnea, PND, swelling in lower extremities, anasarca, dizziness, palpitations, syncope.   GI  No heartburn, indigestion, abdominal pain, nausea, vomiting, diarrhea, change in bowel habits, loss of appetite, bloody stools.   Resp:   No chest wall deformity  Skin: no rash or lesions.  GU: no dysuria, change in color of urine, no urgency or frequency.  No flank pain, no hematuria   MS:  No joint pain or swelling.  No decreased range of motion.  No back pain.    Physical Exam  BP 124/86 (BP Location: Left Arm, Cuff Size: Normal)   Pulse (!) 58   Ht 5\' 10"  (1.778 m)   Wt 231 lb 9.6 oz (105.1 kg)   SpO2 99%   BMI 33.23 kg/m   GEN: A/Ox3; pleasant , NAD, well nourished    HEENT:  [place]/AT,   NOSE-clear, THROAT-clear, no lesions, no postnasal drip or exudate noted. Class 3 MP airway   NECK:  Supple w/ fair ROM; no JVD; normal carotid impulses w/o bruits; no thyromegaly or nodules palpated; no lymphadenopathy.    RESP  Clear  P & A; w/o, wheezes/ rales/ or rhonchi. no accessory muscle use, no dullness to percussion  CARD:  RRR, no m/r/g, tr  peripheral edema, pulses intact, no cyanosis or clubbing.  GI:   Soft & nt; nml bowel sounds; no organomegaly or masses detected.   Musco: Warm bil, no deformities or joint swelling noted.   Neuro: alert, no focal deficits noted.    Skin: Warm, no lesions or rashes      BMET    ProBNP No results found for: PROBNP  Imaging:     PFT Results Latest Ref Rng & Units 06/01/2017  FVC-Pre L 4.31  FVC-Predicted Pre % 92  FVC-Post L 4.46  FVC-Predicted Post % 96  Pre FEV1/FVC % % 81  Post FEV1/FCV % % 83  FEV1-Pre L 3.49  FEV1-Predicted Pre %  100  FEV1-Post L 3.71  DLCO uncorrected ml/min/mmHg 32.71  DLCO UNC% % 101  DLVA Predicted % 98  TLC L 7.52  TLC % Predicted % 107  RV % Predicted % 124    Lab Results  Component Value Date   NITRICOXIDE 28 11/15/2018        Assessment  & Plan:   Acute asthmatic bronchitis Recent flare-continue control for triggers and cough control regimen Check chest x-ray today.  Hold on steroids for now.  Plan  . Patient Instructions  Chest xray today  Continue on Symbicort 160 2 puffs Twice daily  , rinse after use.  Stop Mucinex DM  Begin Robitussin DM 2 tsp every 4hrs for cough as needed  Sips of water to soothe throat and avoid throat clearing .  Tessalon Three times a day  As needed  Cough .  Chlorpheniramine 4mg  At bedtime  for drainage and cough .  Follow up with Dr. Melvyn Novas in 6-8 weeks and As needed   Please contact office for sooner follow up if symptoms do not improve or worsen or seek emergency care         Rexene Edison, NP 08/20/2021

## 2021-08-29 ENCOUNTER — Ambulatory Visit: Payer: Medicare Other | Admitting: Internal Medicine

## 2021-08-29 ENCOUNTER — Ambulatory Visit: Payer: Medicare Other | Admitting: Adult Health

## 2021-09-04 DIAGNOSIS — G4733 Obstructive sleep apnea (adult) (pediatric): Secondary | ICD-10-CM | POA: Diagnosis not present

## 2021-09-05 DIAGNOSIS — G4733 Obstructive sleep apnea (adult) (pediatric): Secondary | ICD-10-CM | POA: Diagnosis not present

## 2021-09-18 DIAGNOSIS — N401 Enlarged prostate with lower urinary tract symptoms: Secondary | ICD-10-CM | POA: Diagnosis not present

## 2021-09-23 DIAGNOSIS — R972 Elevated prostate specific antigen [PSA]: Secondary | ICD-10-CM | POA: Diagnosis not present

## 2021-09-23 DIAGNOSIS — R350 Frequency of micturition: Secondary | ICD-10-CM | POA: Diagnosis not present

## 2021-09-23 DIAGNOSIS — N401 Enlarged prostate with lower urinary tract symptoms: Secondary | ICD-10-CM | POA: Diagnosis not present

## 2021-09-23 DIAGNOSIS — R310 Gross hematuria: Secondary | ICD-10-CM | POA: Diagnosis not present

## 2021-09-25 ENCOUNTER — Encounter: Payer: Self-pay | Admitting: Surgery

## 2021-09-25 ENCOUNTER — Ambulatory Visit: Payer: Medicare Other | Admitting: Surgery

## 2021-09-25 ENCOUNTER — Other Ambulatory Visit: Payer: Self-pay

## 2021-09-25 VITALS — BP 150/86 | HR 61 | Resp 20 | Ht 70.0 in | Wt 231.0 lb

## 2021-09-25 DIAGNOSIS — Z9889 Other specified postprocedural states: Secondary | ICD-10-CM

## 2021-09-25 DIAGNOSIS — Z8679 Personal history of other diseases of the circulatory system: Secondary | ICD-10-CM | POA: Diagnosis not present

## 2021-09-28 ENCOUNTER — Other Ambulatory Visit: Payer: Self-pay | Admitting: Internal Medicine

## 2021-09-29 NOTE — Progress Notes (Signed)
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  He last saw Dr. Prescott Gum on 10/03/2020 at which time he was doing well.  He has had no significant medical change for the past year.  He continues to feel well overall.  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  He said that it does not cause him any significant pain. Current Outpatient Medications  Medication Sig Dispense Refill   albuterol (VENTOLIN HFA) 108 (90 Base) MCG/ACT inhaler INHALE 2 PUFFS INTO THE LUNGS EVERY 6 HOURS AS NEEDED FOR WHEEZING OR SHORTNESS OF BREATH (Patient taking differently: Inhale 2 puffs into the lungs every 6 (six) hours as needed for shortness of breath.) 18 g 5   ALPRAZolam (XANAX) 0.25 MG tablet Take 1 tablet (0.25 mg total) by mouth 3 (three) times daily as needed for anxiety. 15 tablet 0   aluminum hydroxide-magnesium carbonate (GAVISCON) 95-358 MG/15ML SUSP Take 15 mLs by mouth as needed for indigestion or heartburn.     amoxicillin (AMOXIL) 500 MG capsule Take 4 capsules (2,000 mg total) by mouth See admin instructions. 1 hour prior to dental procedure 8 capsule 5   azelastine (ASTELIN) 137 MCG/SPRAY nasal spray Place 1-2 sprays into the nose 2 (two) times daily as needed for rhinitis or allergies.      benzonatate (TESSALON) 200 MG capsule Take 1 capsule (200 mg total) by mouth 3 (three) times daily as needed for cough. 60 capsule 3   budesonide-formoterol (SYMBICORT) 160-4.5 MCG/ACT inhaler Inhale 2 puffs into the lungs in the morning and at bedtime. 1 each 5   calcium carbonate (TUMS - DOSED IN MG ELEMENTAL CALCIUM) 500 MG chewable tablet Chew 500-1,000 mg by mouth daily as needed for indigestion or heartburn.     chlorpheniramine (CHLOR-TRIMETON) 4 MG tablet Take 4 mg by mouth every 6 (six) hours as needed for allergies.      COLCRYS 0.6 MG tablet Take 0.6 mg by mouth in the morning.     Dextromethorphan-guaiFENesin (MUCINEX DM MAXIMUM STRENGTH) 60-1200 MG TB12 Take 1 tablet by mouth 2 (two) times daily as needed (congestion/cough).     diclofenac Sodium (VOLTAREN) 1 % GEL Apply 2 g topically daily as needed (pain).     diphenhydrAMINE (BENADRYL) 25 MG tablet Take 50 mg by mouth daily as needed for allergies.     EPINEPHrine 0.3 mg/0.3 mL IJ SOAJ injection Inject 0.3 mLs (0.3 mg total) into the muscle as needed (for angioedema). 2 Device 0   Erenumab-aooe (AIMOVIG) 140 MG/ML SOAJ Inject 140 mg into the skin every 28 (twenty-eight) days. 1 mL 11   Ferrous Sulfate (IRON) 142 (45 Fe) MG TBCR Take 45 mg by mouth in the morning and at bedtime.     finasteride (PROSCAR) 5 MG tablet Take 5 mg by mouth at bedtime.      levothyroxine (SYNTHROID, LEVOTHROID) 112 MCG tablet Take 112 mcg by mouth daily before breakfast.      liothyronine (CYTOMEL) 25 MCG tablet Take 25 mcg by mouth daily before breakfast.     losartan (COZAAR) 50 MG tablet TAKE 1 TABLET BY MOUTH EVERY DAY (Patient taking differently: Take 50 mg by mouth daily.) 30 tablet 6   magnesium oxide (MAG-OX) 400 MG tablet Take 400 mg by mouth daily.     Menthol, Topical Analgesic, (BLUE-EMU MAXIMUM STRENGTH  EX) Apply 1 application topically as needed (pain).     Methylcellulose, Laxative, (FIBER THERAPY) 500 MG TABS Take 500-1,000 mg by mouth daily.     metoprolol tartrate (LOPRESSOR) 50 MG tablet Take 1 tablet (50 mg total) by mouth 2 (two) times daily. 180 tablet 3   Multiple Vitamin (MULTIVITAMIN WITH MINERALS) TABS tablet Take 1 tablet by mouth daily.     NON FORMULARY Pt uses a cpap nightly     olopatadine (PATANOL) 0.1 % ophthalmic solution Place 1 drop into both eyes 2 (two) times daily as needed for allergies.     omeprazole (PRILOSEC) 40 MG capsule Take 1 capsule (40 mg total) by mouth 2 (two) times daily. 180 capsule 1   rivaroxaban (XARELTO) 20 MG TABS tablet  Take 1 tablet (20 mg total) by mouth daily with supper. 90 tablet 3   rizatriptan (MAXALT) 10 MG tablet Take 1 tablet (10 mg total) by mouth as needed for migraine. May repeat in 2 hours if needed 10 tablet 11   saccharomyces boulardii (FLORASTOR) 250 MG capsule Take 250 mg by mouth daily as needed (when taking antibiotics).     sodium chloride (OCEAN) 0.65 % SOLN nasal spray Place 1 spray into both nostrils as needed for congestion.     tamsulosin (FLOMAX) 0.4 MG CAPS capsule Take 0.4 mg by mouth daily after supper.     Testosterone Cypionate 200 MG/ML SOLN Inject 100 Units into the muscle every Tuesday.     venlafaxine (EFFEXOR-XR) 150 MG 24 hr capsule Take 150 mg by mouth daily with breakfast.     No current facility-administered medications for this visit.     Physical Exam: BP (!) 150/86   Pulse 61   Resp 20   Ht 5\' 10"  (1.778 m)   Wt 231 lb (104.8 kg)   SpO2 97% Comment: RA  BMI 33.15 kg/m  He looks well. Cardiac exam shows a regular rate and rhythm with normal heart sounds.  There is a 2/6 systolic murmur along the right sternal border. There is an obvious fibrous nonunion of the sternum on exam. Lungs are clear.  Diagnostic Tests:  Narrative & Impression  CLINICAL DATA:  Bronchitis.   EXAM: CHEST - 2 VIEW   COMPARISON:  05/21/2021   FINDINGS: Mild hyperinflation. Prior median sternotomy. Aortic valve repair. Midline trachea. Normal heart size. Tortuous thoracic aorta. No pleural effusion or pneumothorax. Surgical clips projecting over the central right axilla. Clear lungs.   IMPRESSION: No acute cardiopulmonary disease.     Electronically Signed   By: Abigail Miyamoto M.D.   On: 08/20/2021 11:17    Impression:  He is doing well overall almost 5 years following Bentall procedure.  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  He does note significant motion of the sternum but has no significant pain.   He asked me about repairing this and I explained that it is not a completely straightforward procedure since he has had previous heart surgery and would require mobilization of the sternum with the potential to injure structures beneath including the heart.  It is also possible that his sternum has been fractured in multiple locations and may be difficult to put back together with a good reliable result.  He could still end up with a sternal separation and nonunion.  He asked about using sternal plates and set of wires.  I do not think this would  change the complexity or chance of success.  I told him that I would not recommend doing anything unless he was having significant discomfort from it.  He is going to continue thinking about it.  Plan:  I will plan to see him back in 1 year.  I spent 20 minutes performing this established patient evaluation and > 50% of this time was spent face to face counseling and coordinating the followup of his aortic aneurysm repair and sternal fibrous non-union.   Gaye Pollack, MD [facility] [phone]

## 2021-10-04 DIAGNOSIS — G4733 Obstructive sleep apnea (adult) (pediatric): Secondary | ICD-10-CM | POA: Diagnosis not present

## 2021-10-04 DIAGNOSIS — J40 Bronchitis, not specified as acute or chronic: Secondary | ICD-10-CM | POA: Diagnosis not present

## 2021-10-07 ENCOUNTER — Encounter: Payer: Self-pay | Admitting: Cardiovascular Disease

## 2021-10-07 DIAGNOSIS — U071 COVID-19: Secondary | ICD-10-CM | POA: Diagnosis not present

## 2021-10-07 DIAGNOSIS — D6869 Other thrombophilia: Secondary | ICD-10-CM | POA: Diagnosis not present

## 2021-10-07 DIAGNOSIS — I480 Paroxysmal atrial fibrillation: Secondary | ICD-10-CM | POA: Diagnosis not present

## 2021-10-17 DIAGNOSIS — I10 Essential (primary) hypertension: Secondary | ICD-10-CM | POA: Diagnosis not present

## 2021-10-17 DIAGNOSIS — E782 Mixed hyperlipidemia: Secondary | ICD-10-CM | POA: Diagnosis not present

## 2021-10-17 DIAGNOSIS — E039 Hypothyroidism, unspecified: Secondary | ICD-10-CM | POA: Diagnosis not present

## 2021-10-17 DIAGNOSIS — F3341 Major depressive disorder, recurrent, in partial remission: Secondary | ICD-10-CM | POA: Diagnosis not present

## 2021-10-20 DIAGNOSIS — J189 Pneumonia, unspecified organism: Secondary | ICD-10-CM

## 2021-10-20 HISTORY — DX: Pneumonia, unspecified organism: J18.9

## 2021-10-24 DIAGNOSIS — I10 Essential (primary) hypertension: Secondary | ICD-10-CM | POA: Diagnosis not present

## 2021-10-24 DIAGNOSIS — E039 Hypothyroidism, unspecified: Secondary | ICD-10-CM | POA: Diagnosis not present

## 2021-10-24 DIAGNOSIS — E782 Mixed hyperlipidemia: Secondary | ICD-10-CM | POA: Diagnosis not present

## 2021-10-24 DIAGNOSIS — J4520 Mild intermittent asthma, uncomplicated: Secondary | ICD-10-CM | POA: Diagnosis not present

## 2021-10-26 ENCOUNTER — Encounter (HOSPITAL_COMMUNITY): Payer: Self-pay

## 2021-10-26 ENCOUNTER — Emergency Department (HOSPITAL_COMMUNITY): Payer: Medicare Other

## 2021-10-26 ENCOUNTER — Inpatient Hospital Stay (HOSPITAL_COMMUNITY)
Admission: EM | Admit: 2021-10-26 | Discharge: 2021-11-05 | DRG: 871 | Disposition: A | Discharge status: Home / Self Care | Payer: Medicare Other | Attending: Family Medicine | Admitting: Family Medicine

## 2021-10-26 DIAGNOSIS — Z952 Presence of prosthetic heart valve: Secondary | ICD-10-CM | POA: Diagnosis not present

## 2021-10-26 DIAGNOSIS — I2510 Atherosclerotic heart disease of native coronary artery without angina pectoris: Secondary | ICD-10-CM | POA: Diagnosis not present

## 2021-10-26 DIAGNOSIS — Z885 Allergy status to narcotic agent status: Secondary | ICD-10-CM

## 2021-10-26 DIAGNOSIS — Z953 Presence of xenogenic heart valve: Secondary | ICD-10-CM

## 2021-10-26 DIAGNOSIS — M797 Fibromyalgia: Secondary | ICD-10-CM | POA: Diagnosis present

## 2021-10-26 DIAGNOSIS — Z66 Do not resuscitate: Secondary | ICD-10-CM | POA: Diagnosis present

## 2021-10-26 DIAGNOSIS — R350 Frequency of micturition: Secondary | ICD-10-CM | POA: Diagnosis present

## 2021-10-26 DIAGNOSIS — A419 Sepsis, unspecified organism: Secondary | ICD-10-CM | POA: Diagnosis not present

## 2021-10-26 DIAGNOSIS — R7881 Bacteremia: Secondary | ICD-10-CM | POA: Diagnosis present

## 2021-10-26 DIAGNOSIS — R6520 Severe sepsis without septic shock: Secondary | ICD-10-CM

## 2021-10-26 DIAGNOSIS — E039 Hypothyroidism, unspecified: Secondary | ICD-10-CM | POA: Diagnosis present

## 2021-10-26 DIAGNOSIS — G4733 Obstructive sleep apnea (adult) (pediatric): Secondary | ICD-10-CM | POA: Diagnosis present

## 2021-10-26 DIAGNOSIS — F419 Anxiety disorder, unspecified: Secondary | ICD-10-CM | POA: Diagnosis present

## 2021-10-26 DIAGNOSIS — Z87891 Personal history of nicotine dependence: Secondary | ICD-10-CM | POA: Diagnosis not present

## 2021-10-26 DIAGNOSIS — I480 Paroxysmal atrial fibrillation: Secondary | ICD-10-CM | POA: Diagnosis not present

## 2021-10-26 DIAGNOSIS — I10 Essential (primary) hypertension: Secondary | ICD-10-CM | POA: Diagnosis present

## 2021-10-26 DIAGNOSIS — D6959 Other secondary thrombocytopenia: Secondary | ICD-10-CM | POA: Diagnosis present

## 2021-10-26 DIAGNOSIS — I619 Nontraumatic intracerebral hemorrhage, unspecified: Secondary | ICD-10-CM | POA: Diagnosis not present

## 2021-10-26 DIAGNOSIS — J341 Cyst and mucocele of nose and nasal sinus: Secondary | ICD-10-CM | POA: Diagnosis not present

## 2021-10-26 DIAGNOSIS — K219 Gastro-esophageal reflux disease without esophagitis: Secondary | ICD-10-CM | POA: Diagnosis present

## 2021-10-26 DIAGNOSIS — U071 COVID-19: Secondary | ICD-10-CM | POA: Diagnosis present

## 2021-10-26 DIAGNOSIS — Z79899 Other long term (current) drug therapy: Secondary | ICD-10-CM | POA: Diagnosis not present

## 2021-10-26 DIAGNOSIS — Z888 Allergy status to other drugs, medicaments and biological substances status: Secondary | ICD-10-CM

## 2021-10-26 DIAGNOSIS — E872 Acidosis, unspecified: Secondary | ICD-10-CM | POA: Diagnosis not present

## 2021-10-26 DIAGNOSIS — R6521 Severe sepsis with septic shock: Secondary | ICD-10-CM | POA: Diagnosis not present

## 2021-10-26 DIAGNOSIS — B955 Unspecified streptococcus as the cause of diseases classified elsewhere: Secondary | ICD-10-CM | POA: Diagnosis not present

## 2021-10-26 DIAGNOSIS — N179 Acute kidney failure, unspecified: Secondary | ICD-10-CM | POA: Diagnosis present

## 2021-10-26 DIAGNOSIS — K5730 Diverticulosis of large intestine without perforation or abscess without bleeding: Secondary | ICD-10-CM | POA: Diagnosis not present

## 2021-10-26 DIAGNOSIS — Z7951 Long term (current) use of inhaled steroids: Secondary | ICD-10-CM

## 2021-10-26 DIAGNOSIS — J45909 Unspecified asthma, uncomplicated: Secondary | ICD-10-CM | POA: Diagnosis not present

## 2021-10-26 DIAGNOSIS — G039 Meningitis, unspecified: Secondary | ICD-10-CM | POA: Diagnosis not present

## 2021-10-26 DIAGNOSIS — A409 Streptococcal sepsis, unspecified: Secondary | ICD-10-CM | POA: Diagnosis not present

## 2021-10-26 DIAGNOSIS — Z79890 Hormone replacement therapy (postmenopausal): Secondary | ICD-10-CM

## 2021-10-26 DIAGNOSIS — Z6834 Body mass index (BMI) 34.0-34.9, adult: Secondary | ICD-10-CM

## 2021-10-26 DIAGNOSIS — I339 Acute and subacute endocarditis, unspecified: Secondary | ICD-10-CM | POA: Diagnosis not present

## 2021-10-26 DIAGNOSIS — I351 Nonrheumatic aortic (valve) insufficiency: Secondary | ICD-10-CM | POA: Diagnosis not present

## 2021-10-26 DIAGNOSIS — Z7901 Long term (current) use of anticoagulants: Secondary | ICD-10-CM

## 2021-10-26 DIAGNOSIS — D696 Thrombocytopenia, unspecified: Secondary | ICD-10-CM | POA: Diagnosis not present

## 2021-10-26 DIAGNOSIS — N400 Enlarged prostate without lower urinary tract symptoms: Secondary | ICD-10-CM | POA: Diagnosis present

## 2021-10-26 DIAGNOSIS — R0902 Hypoxemia: Secondary | ICD-10-CM | POA: Diagnosis not present

## 2021-10-26 DIAGNOSIS — N401 Enlarged prostate with lower urinary tract symptoms: Secondary | ICD-10-CM | POA: Diagnosis present

## 2021-10-26 DIAGNOSIS — E669 Obesity, unspecified: Secondary | ICD-10-CM | POA: Diagnosis present

## 2021-10-26 DIAGNOSIS — I340 Nonrheumatic mitral (valve) insufficiency: Secondary | ICD-10-CM | POA: Diagnosis not present

## 2021-10-26 DIAGNOSIS — R059 Cough, unspecified: Secondary | ICD-10-CM | POA: Diagnosis not present

## 2021-10-26 DIAGNOSIS — Z8616 Personal history of COVID-19: Secondary | ICD-10-CM | POA: Diagnosis not present

## 2021-10-26 DIAGNOSIS — J984 Other disorders of lung: Secondary | ICD-10-CM | POA: Diagnosis not present

## 2021-10-26 DIAGNOSIS — I081 Rheumatic disorders of both mitral and tricuspid valves: Secondary | ICD-10-CM | POA: Diagnosis not present

## 2021-10-26 DIAGNOSIS — R000 Tachycardia, unspecified: Secondary | ICD-10-CM | POA: Diagnosis not present

## 2021-10-26 DIAGNOSIS — I6782 Cerebral ischemia: Secondary | ICD-10-CM | POA: Diagnosis not present

## 2021-10-26 LAB — BLOOD CULTURE ID PANEL (REFLEXED) - BCID2

## 2021-10-26 LAB — CBC WITH DIFFERENTIAL/PLATELET
Abs Immature Granulocytes: 0.02 10*3/uL (ref 0.00–0.07)
Basophils Absolute: 0 10*3/uL (ref 0.0–0.1)
Basophils Relative: 0 %
Eosinophils Absolute: 0 10*3/uL (ref 0.0–0.5)
Eosinophils Relative: 0 %
HCT: 45 % (ref 39.0–52.0)
Hemoglobin: 15.4 g/dL (ref 13.0–17.0)
Immature Granulocytes: 1 %
Lymphocytes Relative: 3 %
Lymphs Abs: 0.1 10*3/uL — ABNORMAL LOW (ref 0.7–4.0)
MCH: 32.2 pg (ref 26.0–34.0)
MCHC: 34.2 g/dL (ref 30.0–36.0)
MCV: 94.1 fL (ref 80.0–100.0)
Monocytes Absolute: 0.1 10*3/uL (ref 0.1–1.0)
Monocytes Relative: 3 %
Neutro Abs: 3.2 10*3/uL (ref 1.7–7.7)
Neutrophils Relative %: 93 %
Platelets: 96 10*3/uL — ABNORMAL LOW (ref 150–400)
RBC: 4.78 MIL/uL (ref 4.22–5.81)
RDW: 12.9 % (ref 11.5–15.5)
WBC: 3.5 10*3/uL — ABNORMAL LOW (ref 4.0–10.5)
nRBC: 0 % (ref 0.0–0.2)

## 2021-10-26 LAB — COMPREHENSIVE METABOLIC PANEL
ALT: 23 U/L (ref 0–44)
AST: 27 U/L (ref 15–41)
Albumin: 3.7 g/dL (ref 3.5–5.0)
Alkaline Phosphatase: 54 U/L (ref 38–126)
Anion gap: 11 (ref 5–15)
BUN: 32 mg/dL — ABNORMAL HIGH (ref 8–23)
CO2: 24 mmol/L (ref 22–32)
Calcium: 8.6 mg/dL — ABNORMAL LOW (ref 8.9–10.3)
Chloride: 96 mmol/L — ABNORMAL LOW (ref 98–111)
Creatinine, Ser: 1.61 mg/dL — ABNORMAL HIGH (ref 0.61–1.24)
GFR, Estimated: 46 mL/min — ABNORMAL LOW (ref >60)
Glucose, Bld: 107 mg/dL — ABNORMAL HIGH (ref 70–99)
Potassium: 3.7 mmol/L (ref 3.5–5.1)
Sodium: 131 mmol/L — ABNORMAL LOW (ref 135–145)
Total Bilirubin: 1.2 mg/dL (ref 0.3–1.2)
Total Protein: 7 g/dL (ref 6.5–8.1)

## 2021-10-26 LAB — RESPIRATORY PANEL BY PCR

## 2021-10-26 LAB — URINALYSIS, ROUTINE W REFLEX MICROSCOPIC
Bacteria, UA: NONE SEEN
Bilirubin Urine: NEGATIVE
Glucose, UA: NEGATIVE mg/dL
Hgb urine dipstick: NEGATIVE
Ketones, ur: NEGATIVE mg/dL
Leukocytes,Ua: NEGATIVE
Nitrite: NEGATIVE
Protein, ur: NEGATIVE mg/dL
Specific Gravity, Urine: 1.018 (ref 1.005–1.030)
pH: 5 (ref 5.0–8.0)

## 2021-10-26 LAB — APTT: aPTT: 48 seconds — ABNORMAL HIGH (ref 24–36)

## 2021-10-26 LAB — RESP PANEL BY RT-PCR (FLU A&B, COVID) ARPGX2
Influenza A by PCR: NEGATIVE
Influenza B by PCR: NEGATIVE
SARS Coronavirus 2 by RT PCR: POSITIVE — AB

## 2021-10-26 LAB — LACTIC ACID, PLASMA
Lactic Acid, Venous: 2.6 mmol/L (ref 0.5–1.9)
Lactic Acid, Venous: 1.8 mmol/L (ref 0.5–1.9)

## 2021-10-26 LAB — PROTIME-INR
INR: 2.5 — ABNORMAL HIGH (ref 0.8–1.2)
Prothrombin Time: 26.9 seconds — ABNORMAL HIGH (ref 11.4–15.2)

## 2021-10-26 MED ORDER — LACTATED RINGERS IV BOLUS
2190.0000 mL | Freq: Once | INTRAVENOUS | Status: AC
  Administered 2021-10-26: 2190 mL via INTRAVENOUS

## 2021-10-26 MED ORDER — HYDROCORTISONE SOD SUC (PF) 100 MG IJ SOLR
100.0000 mg | Freq: Two times a day (BID) | INTRAMUSCULAR | Status: DC
  Administered 2021-10-26 – 2021-10-27 (×3): 100 mg via INTRAVENOUS
  Filled 2021-10-26 (×3): qty 2

## 2021-10-26 MED ORDER — DIPHENHYDRAMINE HCL 25 MG PO CAPS: 50.0000 mg | ORAL_CAPSULE | Freq: Every day | ORAL | Status: DC | PRN

## 2021-10-26 MED ORDER — RIVAROXABAN 20 MG PO TABS
20.0000 mg | ORAL_TABLET | Freq: Every day | ORAL | Status: DC
  Administered 2021-10-26 – 2021-10-28 (×3): 20 mg via ORAL
  Filled 2021-10-26 (×3): qty 1

## 2021-10-26 MED ORDER — GUAIFENESIN ER 600 MG PO TB12
600.0000 mg | ORAL_TABLET | Freq: Two times a day (BID) | ORAL | Status: DC
  Administered 2021-10-26 – 2021-10-29 (×7): 600 mg via ORAL
  Filled 2021-10-26 (×7): qty 1

## 2021-10-26 MED ORDER — SODIUM CHLORIDE 0.9 % IV SOLN
2.0000 g | Freq: Two times a day (BID) | INTRAVENOUS | Status: DC
  Administered 2021-10-26 – 2021-10-27 (×2): 2 g via INTRAVENOUS
  Filled 2021-10-26 (×2): qty 2

## 2021-10-26 MED ORDER — VANCOMYCIN HCL 1250 MG/250ML IV SOLN: 1250.0000 mg | INTRAVENOUS | Status: DC

## 2021-10-26 MED ORDER — PSYLLIUM 95 % PO PACK
1.0000 | PACK | Freq: Every day | ORAL | Status: DC
  Administered 2021-10-27 – 2021-10-29 (×3): 1 via ORAL
  Filled 2021-10-26 (×3): qty 1

## 2021-10-26 MED ORDER — METRONIDAZOLE 500 MG/100ML IV SOLN
500.0000 mg | Freq: Once | INTRAVENOUS | Status: AC
  Administered 2021-10-26: 500 mg via INTRAVENOUS
  Filled 2021-10-26: qty 100

## 2021-10-26 MED ORDER — ACETAMINOPHEN 650 MG RE SUPP: 650.0000 mg | Freq: Four times a day (QID) | RECTAL | Status: DC | PRN

## 2021-10-26 MED ORDER — VENLAFAXINE HCL ER 150 MG PO CP24
150.0000 mg | ORAL_CAPSULE | Freq: Every day | ORAL | Status: DC
  Administered 2021-10-27 – 2021-11-05 (×10): 150 mg via ORAL
  Filled 2021-10-26 (×10): qty 1

## 2021-10-26 MED ORDER — FINASTERIDE 5 MG PO TABS
5.0000 mg | ORAL_TABLET | Freq: Every day | ORAL | Status: DC
  Administered 2021-10-27 – 2021-10-28 (×3): 5 mg via ORAL
  Filled 2021-10-26 (×3): qty 1

## 2021-10-26 MED ORDER — MOMETASONE FURO-FORMOTEROL FUM 200-5 MCG/ACT IN AERO
2.0000 | INHALATION_SPRAY | Freq: Two times a day (BID) | RESPIRATORY_TRACT | Status: DC
  Administered 2021-10-27 – 2021-10-28 (×4): 2 via RESPIRATORY_TRACT
  Filled 2021-10-26: qty 8.8

## 2021-10-26 MED ORDER — ALPRAZOLAM 0.25 MG PO TABS
0.2500 mg | ORAL_TABLET | Freq: Three times a day (TID) | ORAL | Status: DC | PRN
  Administered 2021-10-27 – 2021-11-05 (×19): 0.25 mg via ORAL
  Filled 2021-10-26 (×19): qty 1

## 2021-10-26 MED ORDER — SALINE SPRAY 0.65 % NA SOLN
1.0000 | NASAL | Status: DC | PRN
  Filled 2021-10-26: qty 44

## 2021-10-26 MED ORDER — ONDANSETRON HCL 4 MG PO TABS
4.0000 mg | ORAL_TABLET | Freq: Four times a day (QID) | ORAL | Status: DC | PRN
Start: 2021-10-26 — End: 2021-11-05

## 2021-10-26 MED ORDER — BENZONATATE 100 MG PO CAPS
200.0000 mg | ORAL_CAPSULE | Freq: Three times a day (TID) | ORAL | Status: DC | PRN
  Administered 2021-10-27 – 2021-11-01 (×4): 200 mg via ORAL
  Filled 2021-10-26 (×4): qty 2

## 2021-10-26 MED ORDER — SODIUM CHLORIDE 0.9 % IV SOLN
2.0000 g | Freq: Once | INTRAVENOUS | Status: AC
  Administered 2021-10-26: 2 g via INTRAVENOUS
  Filled 2021-10-26: qty 2

## 2021-10-26 MED ORDER — METRONIDAZOLE 500 MG/100ML IV SOLN
500.0000 mg | Freq: Two times a day (BID) | INTRAVENOUS | Status: DC
  Administered 2021-10-26 – 2021-10-27 (×2): 500 mg via INTRAVENOUS
  Filled 2021-10-26 (×3): qty 100

## 2021-10-26 MED ORDER — ALBUTEROL SULFATE HFA 108 (90 BASE) MCG/ACT IN AERS
2.0000 | INHALATION_SPRAY | Freq: Four times a day (QID) | RESPIRATORY_TRACT | Status: DC | PRN
  Administered 2021-10-29 – 2021-11-03 (×2): 2 via RESPIRATORY_TRACT
  Filled 2021-10-26: qty 6.7

## 2021-10-26 MED ORDER — ACETAMINOPHEN 325 MG PO TABS
650.0000 mg | ORAL_TABLET | Freq: Four times a day (QID) | ORAL | Status: DC | PRN
  Administered 2021-10-27 – 2021-11-05 (×8): 650 mg via ORAL
  Filled 2021-10-26 (×8): qty 2

## 2021-10-26 MED ORDER — LIOTHYRONINE SODIUM 25 MCG PO TABS
25.0000 ug | ORAL_TABLET | Freq: Every day | ORAL | Status: DC
  Administered 2021-10-27 – 2021-11-05 (×10): 25 ug via ORAL
  Filled 2021-10-26 (×10): qty 1

## 2021-10-26 MED ORDER — VANCOMYCIN HCL IN DEXTROSE 1-5 GM/200ML-% IV SOLN
1000.0000 mg | Freq: Once | INTRAVENOUS | Status: AC
  Administered 2021-10-26: 1000 mg via INTRAVENOUS
  Filled 2021-10-26: qty 200

## 2021-10-26 MED ORDER — ACETAMINOPHEN 500 MG PO TABS
1000.0000 mg | ORAL_TABLET | Freq: Once | ORAL | Status: AC
  Administered 2021-10-26: 1000 mg via ORAL
  Filled 2021-10-26: qty 2

## 2021-10-26 MED ORDER — AZELASTINE HCL 0.1 % NA SOLN
1.0000 | Freq: Two times a day (BID) | NASAL | Status: DC | PRN
  Filled 2021-10-26: qty 30

## 2021-10-26 MED ORDER — PANTOPRAZOLE SODIUM 40 MG PO TBEC
80.0000 mg | DELAYED_RELEASE_TABLET | Freq: Every day | ORAL | Status: DC
  Administered 2021-10-26 – 2021-10-29 (×4): 80 mg via ORAL
  Filled 2021-10-26 (×4): qty 2

## 2021-10-26 MED ORDER — SODIUM CHLORIDE 0.9 % IV BOLUS
1000.0000 mL | Freq: Once | INTRAVENOUS | Status: AC
  Administered 2021-10-26: 1000 mL via INTRAVENOUS

## 2021-10-26 MED ORDER — ADULT MULTIVITAMIN W/MINERALS CH
1.0000 | ORAL_TABLET | Freq: Every day | ORAL | Status: DC
  Administered 2021-10-26 – 2021-10-29 (×4): 1 via ORAL
  Filled 2021-10-26 (×4): qty 1

## 2021-10-26 MED ORDER — METHYLCELLULOSE (LAXATIVE) 500 MG PO TABS: 500.0000 mg | ORAL_TABLET | Freq: Every day | ORAL | Status: DC

## 2021-10-26 MED ORDER — VANCOMYCIN HCL 1250 MG/250ML IV SOLN
1250.0000 mg | INTRAVENOUS | Status: DC
  Administered 2021-10-27: 1250 mg via INTRAVENOUS
  Filled 2021-10-26: qty 250

## 2021-10-26 MED ORDER — LACTATED RINGERS IV SOLN: INTRAVENOUS | Status: AC

## 2021-10-26 MED ORDER — ONDANSETRON HCL 4 MG/2ML IJ SOLN: 4.0000 mg | Freq: Four times a day (QID) | INTRAMUSCULAR | Status: DC | PRN

## 2021-10-26 MED ORDER — LEVOTHYROXINE SODIUM 112 MCG PO TABS
112.0000 ug | ORAL_TABLET | Freq: Every day | ORAL | Status: DC
  Administered 2021-10-27 – 2021-11-05 (×10): 112 ug via ORAL
  Filled 2021-10-26 (×10): qty 1

## 2021-10-26 NOTE — ED Notes (Signed)
Patient given water and fan. Wife administered patients home inhaler. PA notified.

## 2021-10-26 NOTE — Progress Notes (Signed)
A consult was received from an ED physician for Vancomycin and Cefepime per pharmacy dosing.  The patient's profile has been reviewed for ht/wt/allergies/indication/available labs.    A one time order has been placed for Cefepime 2gm IV and Vancomycin 2gm (1gm IV x1 followed by another 1gm IV x 1).    Further antibiotics/pharmacy consults should be ordered by admitting physician if indicated.                       Thank you, Everette Rank, PharmD 10/26/2021  5:33 AM

## 2021-10-26 NOTE — ED Provider Notes (Signed)
Care assumed from [place], please see his note for full details, but in brief Grant [name] is a 71 y.o. male who presents with weakness, cough and fever.  Was treated for COVID at the end of December, took multi peer of year, did not require hospitalization.  Has had persistent cough since then and describes some possible aspiration events.  Has also complained of some intermittent abdominal pain.  On arrival patient, tachycardic, satting at 90%, placed on 2 L with improvement.  Does not wear any home oxygen.  Code sepsis initiated, and patient treated with spectrum antibiotics and 30 cc/kg fluid bolus per ideal body weight.  Work-up thus far without clear source of infection, chest x-ray is clear and urinalysis without signs of infection.  CTs of the chest, abdomen and pelvis are pending.  Patient will require admission.  BP 118/65    Pulse (!) 125    Temp (!) 103.1 F (39.5 C) (Oral)    Resp 17    Ht 5\' 10"  (1.778 m)    Wt 108.9 kg    SpO2 92%    BMI 34.44 kg/m    Procedures  Procedures  ED Course / MDM   Labs Reviewed  RESP PANEL BY RT-PCR (FLU A&B, COVID) ARPGX2 - Abnormal; Notable for the following components:      Result Value   SARS Coronavirus 2 by RT PCR POSITIVE (*)    All other components within normal limits  LACTIC ACID, PLASMA - Abnormal; Notable for the following components:   Lactic Acid, Venous 2.6 (*)    All other components within normal limits  COMPREHENSIVE METABOLIC PANEL - Abnormal; Notable for the following components:   Sodium 131 (*)    Chloride 96 (*)    Glucose, Bld 107 (*)    BUN 32 (*)    Creatinine, Ser 1.61 (*)    Calcium 8.6 (*)    GFR, Estimated 46 (*)    All other components within normal limits  CBC WITH DIFFERENTIAL/PLATELET - Abnormal; Notable for the following components:   WBC 3.5 (*)    Platelets 96 (*)    Lymphs Abs 0.1 (*)    All other components within normal limits  PROTIME-INR - Abnormal; Notable for the following  components:   Prothrombin Time 26.9 (*)    INR 2.5 (*)    All other components within normal limits  APTT - Abnormal; Notable for the following components:   aPTT 48 (*)    All other components within normal limits  CULTURE, BLOOD (ROUTINE X 2)  CULTURE, BLOOD (ROUTINE X 2)  URINE CULTURE  LACTIC ACID, PLASMA  URINALYSIS, ROUTINE W REFLEX MICROSCOPIC   CT ABDOMEN PELVIS WO CONTRAST  Result Date: 10/26/2021 CLINICAL DATA:  71 year old male with sepsis. Positive COVID-19. EXAM: CT CHEST, ABDOMEN AND PELVIS WITHOUT CONTRAST TECHNIQUE: Multidetector CT imaging of the chest, abdomen and pelvis was performed following the standard protocol without IV contrast. COMPARISON:  CTA Chest, Abdomen, and Pelvis 10/10/2020. Chest radiographs 0447 hours today and earlier. FINDINGS: CT CHEST FINDINGS Cardiovascular: Prior CABG. Chronic aortic valve replacement. Normal caliber thoracic aorta, stable mildly tortuous descending aorta. No cardiomegaly or pericardial effusion. Vascular patency is not evaluated in the absence of IV contrast. Mediastinum/Nodes: Negative aside from post CABG postoperative changes. Lungs/Pleura: Mild respiratory motion. Stable lung volumes from 2021. Major airways are patent. Left lung base mild atelectasis or scarring not significantly changed from 2021. No consolidation. No pleural effusion. No convincing acute lung  inflammation. Musculoskeletal: Chronic ununited sternotomy. 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 No acute osseous abnormality identified. CT ABDOMEN PELVIS FINDINGS Hepatobiliary: Chronically absent gallbladder. Negative noncontrast liver. Pancreas: Negative. Spleen: Negative. Adrenals/Urinary Tract: Normal adrenal glands. Nonobstructed kidneys. Symmetric pararenal inflammatory stranding is nonspecific and not significantly changed from 2021. No nephrolithiasis. No hydroureter. Diminutive bladder.  Incidental pelvic phleboliths. Stomach/Bowel: Negative rectum. Previous partial distal colectomy is new from 2021. There is a sigmoid anastomosis at the pelvic inlet with no adverse features (series 2, image 102). Upstream residual fairly extensive diverticulosis, but no convincing active inflammation. Retained stool in redundant transverse colon. Mild diverticulosis at the hepatic flexure. Negative right colon. Decompressed terminal ileum. Diminutive or absent appendix. No dilated small bowel. Fluid filled but nondilated stomach and duodenum. No free air. No free fluid. No convincing mesenteric stranding. Vascular/Lymphatic: Normal caliber abdominal aorta. Aortoiliac calcified atherosclerosis. Vascular patency is not evaluated in the absence of IV contrast. No lymphadenopathy identified. Reproductive: Chronic prostatomegaly appears stable. Other: No pelvic free fluid. 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 No acute osseous abnormality identified. IMPRESSION: 1. No acute or inflammatory process identified in the non-contrast chest, abdomen, or pelvis. 2. Left lung base scarring. Previous CABG, aortic valve replacement, and partial sigmoid colectomy. Diverticulosis of the residual colon with no convincing active inflammation. 3. Aortic Atherosclerosis (ICD10-I70.0). Electronically Signed   By: Genevie Ann M.D.   On: 10/26/2021 07:30   CT Chest Wo Contrast  Result Date: 10/26/2021 CLINICAL DATA:  71 year old male with sepsis. Positive COVID-19. EXAM: CT CHEST, ABDOMEN AND PELVIS WITHOUT CONTRAST TECHNIQUE: Multidetector CT imaging of the chest, abdomen and pelvis was performed following the standard protocol without IV contrast. COMPARISON:  CTA Chest, Abdomen, and Pelvis 10/10/2020. Chest radiographs 0447 hours today and earlier. FINDINGS: CT CHEST FINDINGS Cardiovascular: Prior CABG. Chronic aortic valve replacement. Normal caliber thoracic aorta, stable mildly tortuous  descending aorta. No cardiomegaly or pericardial effusion. Vascular patency is not evaluated in the absence of IV contrast. Mediastinum/Nodes: Negative aside from post CABG postoperative changes. Lungs/Pleura: Mild respiratory motion. Stable lung volumes from 2021. Major airways are patent. Left lung base mild atelectasis or scarring not significantly changed from 2021. No consolidation. No pleural effusion. No convincing acute lung inflammation. Musculoskeletal: Chronic ununited sternotomy. 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 No acute osseous abnormality identified. CT ABDOMEN PELVIS FINDINGS Hepatobiliary: Chronically absent gallbladder. Negative noncontrast liver. Pancreas: Negative. Spleen: Negative. Adrenals/Urinary Tract: Normal adrenal glands. Nonobstructed kidneys. Symmetric pararenal inflammatory stranding is nonspecific and not significantly changed from 2021. No nephrolithiasis. No hydroureter. Diminutive bladder. Incidental pelvic phleboliths. Stomach/Bowel: Negative rectum. Previous partial distal colectomy is new from 2021. There is a sigmoid anastomosis at the pelvic inlet with no adverse features (series 2, image 102). Upstream residual fairly extensive diverticulosis, but no convincing active inflammation. Retained stool in redundant transverse colon. Mild diverticulosis at the hepatic flexure. Negative right colon. Decompressed terminal ileum. Diminutive or absent appendix. No dilated small bowel. Fluid filled but nondilated stomach and duodenum. No free air. No free fluid. No convincing mesenteric stranding. Vascular/Lymphatic: Normal caliber abdominal aorta. Aortoiliac calcified atherosclerosis. Vascular patency is not evaluated in the absence of IV contrast. No lymphadenopathy identified. Reproductive: Chronic prostatomegaly appears stable. Other: No pelvic free fluid. Musculoskeletal: Chronic lumbar spine  degeneration especially facet arthropathy. No acute osseous abnormality identified. IMPRESSION: 1. No acute or inflammatory process identified in the non-contrast  chest, abdomen, or pelvis. 2. Left lung base scarring. Previous CABG, aortic valve replacement, and partial sigmoid colectomy. Diverticulosis of the residual colon with no convincing active inflammation. 3. Aortic Atherosclerosis (ICD10-I70.0). Electronically Signed   By: Genevie Ann M.D.   On: 10/26/2021 07:30   DG Chest Port 1 View  Result Date: 10/26/2021 CLINICAL DATA:  Possible sepsis, nonproductive cough, tachycardia. EXAM: PORTABLE CHEST 1 VIEW COMPARISON:  08/20/2021. FINDINGS: The heart size and mediastinal contours are within normal limits. Both lungs are clear. No acute osseous abnormality. Sternotomy wires are present over the midline. Surgical clips are noted in the axillary region on the right. IMPRESSION: No acute cardiopulmonary process. Electronically Signed   By: Brett Fairy M.D.   On: 10/26/2021 04:55     Clinical Course as of 10/26/21 0645  Sat Oct 26, 2021  0422 Ideal body weight calculated to be 73 kg.  30 cc/kg bolus equates to 2190 cc. [LJ]  0554 SARS Coronavirus 2 by RT PCR(!): POSITIVE Patient positive for COVID-19 last month.  Likely not an acute infection.   [LJ]    Clinical Course User Index [LJ] Rayna Sexton, PA-C   Medical Decision Making Amount and/or Complexity of Data Reviewed Independent Historian: spouse External Data Reviewed: labs, radiology, ECG and notes. Labs: ordered.    Details: Significant for mild leukopenia, AKI, lactic acid initially elevated at 2.8, cleared with IV fluids  COVID test is positive, but was treated for COVID last month, low suspicion that this is active COVID infection.  Per labs cycle threshold of 38.7 which does not support active COVID infection. Radiology: ordered and independent interpretation performed.    Details: Chest x-ray without evidence of pneumonia. CTs  of the chest, abdomen and pelvis without evidence of pneumonia or other clear source for infection   Patient's fever has resolved and tachycardia is improving.  There is currently no clear source for sepsis, although there is still concern for aspiration, no evidence of pneumonia on CT.  Patient also with AKI.  Feel he will require hospital admission for continued IV antibiotics and following up blood cultures.   Case has been discussed with Dr. Marylyn Ishihara with [facility] who will see and admit the patient.       Jacqlyn Larsen, PA-C 10/26/21 0930    Quintella Reichert, MD 10/27/21 [phone]

## 2021-10-26 NOTE — H&P (Signed)
History and Physical    Grant [name] FMB:[id number] DOB: [birth year] DOA: 10/26/2021  PCP: Shirline Frees, MD + Patient coming from: Home  Chief Complaint: fatigue  HPI: Grant [name] is a 71 y.o. male with medical history significant of afib on xarelto, recent COVID, depression, hypothyroidism, BPH. Presenting with fatigue. History is from wife at bedside. She reports that he had COVID a few weeks ago. He completed a course of lagevrio and improved. He had some minor cough, but generally felt better overall. However, she has noticed over the last several days he's been more fatigued. He has had sweats and chills. He seems to be experiencing more urinary frequency. During this time, his cough has become more productive. She saw him having what seems to be rigors from her description yesterday and this concerned her. So she brought him to the ED.    Of note, the patient has been on multiple rounds of abx recently. He was recently started on doxycycline.   ED Course: CT chest/ab/pelvis were negative. He was hypotensive w/ an elevated lactic acid. He was started on broad spec abx. [facility] was called for admission.   Review of Systems:  Denies CP, palpitations, N/V/D, sick contacts Review of systems is otherwise negative for all not mentioned in HPI.   PMHx Past Medical History:  Diagnosis Date   Allergic rhinitis    Anxiety    Arthritis    Asthma    Bladder tumor    Chronic fatigue    COVID-19 05/22/2021   Depression    06/30/17 Pt denies being depressed, reports Effexor is taken for Chronic Fatigue    Dyspnea    Enlarged prostate    Fibromyalgia    GERD (gastroesophageal reflux disease)    Headache    History of chronic bronchitis    History of migraine    History of toxic encephalopathy    Hypothyroidism    OSA on CPAP    CPAP 14   PAF (paroxysmal atrial fibrillation) ([place]) CARDIOLOGIST -- DR [name]   DX OCT 2013   S/P AVR (aortic valve replacement) and aortoplasty     Sensitiveness to light    Unspecified essential hypertension    Urethral tumor    PROSTATIC    PSHx Past Surgical History:  Procedure Laterality Date   BENTALL PROCEDURE N/A 07/02/2017   Procedure: BENTALL PROCEDURE;  Surgeon: Ivin Poot, MD;  Location: [place];  Service: Open Heart Surgery;  Laterality: N/A;  WITH CIRC ARREST   BIOPSY  02/06/2021   Procedure: BIOPSY;  Surgeon: Clarene Essex, MD;  Location: [facility];  Service: Endoscopy;;   COLONOSCOPY     COLONOSCOPY WITH PROPOFOL N/A 02/06/2021   Procedure: COLONOSCOPY WITH PROPOFOL;  Surgeon: Clarene Essex, MD;  Location: [facility];  Service: Endoscopy;  Laterality: N/A;   CYSTOSCOPY W/ RETROGRADES Bilateral 06/15/2013   Procedure: CYSTOSCOPY WITH BILATERAL RETROGRADE PYELOGRAM  BLADDER BIOPSY, PROSTATIC URETHRAL BIOPSY, ;  Surgeon: Molli Hazard, MD;  Location: [facility];  Service: Urology;  Laterality: Bilateral;   ESOPHAGOGASTRODUODENOSCOPY (EGD) WITH PROPOFOL N/A 03/11/2018   Procedure: ESOPHAGOGASTRODUODENOSCOPY (EGD) WITH PROPOFOL;  Surgeon: Clarene Essex, MD;  Location: [facility];  Service: Endoscopy;  Laterality: N/A;   LAPAROSCOPIC CHOLECYSTECTOMY  01/14/2001   PROCTOSCOPY N/A 06/12/2021   Procedure: RIGID PROCTOSCOPY;  Surgeon: [name], MD;  Location: [facility];  Service: General;  Laterality: N/A;   RIGHT/LEFT HEART CATH AND CORONARY ANGIOGRAPHY N/A 06/12/2017   Procedure: RIGHT/LEFT HEART CATH  AND CORONARY ANGIOGRAPHY;  Surgeon: Larey Dresser, MD;  Location: [place] [facility];  Service: Cardiovascular;  Laterality: N/A;   TEE WITHOUT CARDIOVERSION N/A 07/02/2017   Procedure: TRANSESOPHAGEAL ECHOCARDIOGRAM (TEE);  Surgeon: Prescott Gum, Collier Salina, MD;  Location: [place];  Service: Open Heart Surgery;  Laterality: N/A;   UMBILICAL HERNIA REPAIR  01/14/2008    SocHx  reports that he quit smoking about 42 years ago. His smoking use included cigarettes. He started smoking about 53 years ago. He  has a 13.50 pack-year smoking history. He has never used smokeless tobacco. He reports current alcohol use of about 4.0 standard drinks per week. He reports that he does not use drugs.  Allergies  Allergen Reactions   Perphenazine Swelling    Tongue swelling   Amiodarone Swelling   Codeine Itching   Tramadol Other (See Comments)    dellusion     FamHx Family History  Problem Relation Age of Onset   Aortic aneurysm Mother 82       cause of death   Other Father        motor vehicle accident   Heart disease Other        family history    Prior to Admission medications   Medication Sig Start Date End Date Taking? Authorizing Provider  albuterol (VENTOLIN HFA) 108 (90 Base) MCG/ACT inhaler INHALE 2 PUFFS INTO THE LUNGS EVERY 6 HOURS AS NEEDED FOR WHEEZING OR SHORTNESS OF BREATH Patient taking differently: 2 puffs every 6 (six) hours as needed for shortness of breath. 09/30/21  Yes Tanda Rockers, MD  ALPRAZolam Duanne Moron) 0.25 MG tablet Take 1 tablet (0.25 mg total) by mouth 3 (three) times daily as needed for anxiety. 10/13/20  Yes Aline August, MD  aluminum hydroxide-magnesium carbonate (GAVISCON) 95-358 MG/15ML SUSP Take 15 mLs by mouth as needed for indigestion or heartburn.   Yes [provider]  azelastine (ASTELIN) 137 MCG/SPRAY nasal spray Place 1-2 sprays into the nose 2 (two) times daily as needed for rhinitis or allergies.    Yes [provider]  benzonatate (TESSALON) 200 MG capsule Take 1 capsule (200 mg total) by mouth 3 (three) times daily as needed for cough. 08/20/21  Yes Parrett, Fonnie Mu, NP  budesonide-formoterol (SYMBICORT) 160-4.5 MCG/ACT inhaler Inhale 2 puffs into the lungs in the morning and at bedtime. 03/19/21  Yes Parrett, [name] S, NP  calcium carbonate (TUMS - DOSED IN MG ELEMENTAL CALCIUM) 500 MG chewable tablet Chew 500-1,000 mg by mouth daily as needed for indigestion or heartburn.   Yes [provider]  COLCRYS 0.6 MG tablet Take  0.6 mg by mouth in the morning. 01/29/12  Yes [provider]  Dextromethorphan-guaiFENesin ([place] DM MAXIMUM STRENGTH) 60-1200 MG TB12 Take 1 tablet by mouth 2 (two) times daily as needed (congestion/cough).   Yes [provider]  diclofenac Sodium (VOLTAREN) 1 % GEL Apply 2 g topically daily as needed (pain).   Yes [provider]  diphenhydrAMINE (BENADRYL) 25 MG tablet Take 50 mg by mouth daily as needed for allergies.   Yes [provider]  doxycycline (VIBRAMYCIN) 100 MG capsule Take 100 mg by mouth 2 (two) times daily. 09/24/21  Yes [provider]  EPINEPHrine 0.3 mg/0.3 mL IJ SOAJ injection Inject 0.3 mLs (0.3 mg total) into the muscle as needed (for angioedema). 02/26/18  Yes Josue Hector, MD  Erenumab-aooe (AIMOVIG) 140 MG/ML SOAJ Inject 140 mg into the skin every 28 (twenty-eight) days. 07/09/21  Yes  Cameron Sprang, MD  Ferrous Sulfate (IRON) 142 (45 Fe) MG TBCR Take 45 mg by mouth in the morning and at bedtime.   Yes [provider]  finasteride (PROSCAR) 5 MG tablet Take 5 mg by mouth at bedtime.  01/14/14  Yes [provider]  levothyroxine (SYNTHROID, LEVOTHROID) 112 MCG tablet Take 112 mcg by mouth daily before breakfast.    Yes [provider]  liothyronine (CYTOMEL) 25 MCG tablet Take 25 mcg by mouth daily before breakfast.   Yes [provider]  losartan (COZAAR) 50 MG tablet TAKE 1 TABLET BY MOUTH EVERY DAY Patient taking differently: Take 50 mg by mouth daily. 10/01/20  Yes [name], MD  Menthol, Topical Analgesic, (BLUE-EMU MAXIMUM STRENGTH EX) Apply 1 application topically as needed (pain).   Yes [provider]  Methylcellulose, Laxative, (FIBER THERAPY) 500 MG TABS Take 500-1,000 mg by mouth daily.   Yes [provider]  metoprolol tartrate (LOPRESSOR) 50 MG tablet Take 1 tablet (50 mg total) by mouth 2 (two) times daily. 06/04/21  Yes Josue Hector, MD  Multiple  Vitamin (MULTIVITAMIN WITH MINERALS) TABS tablet Take 1 tablet by mouth daily.   Yes [provider]  olopatadine (PATANOL) 0.1 % ophthalmic solution Place 1 drop into both eyes 2 (two) times daily as needed for allergies.   Yes [provider]  omeprazole (PRILOSEC) 40 MG capsule Take 1 capsule (40 mg total) by mouth 2 (two) times daily. 10/31/16  Yes Josue Hector, MD  rivaroxaban (XARELTO) 20 MG TABS tablet Take 1 tablet (20 mg total) by mouth daily with supper. 06/04/21  Yes Josue Hector, MD  rizatriptan (MAXALT) 10 MG tablet Take 1 tablet (10 mg total) by mouth as needed for migraine. May repeat in 2 hours if needed 07/09/21  Yes Cameron Sprang, MD  saccharomyces boulardii (FLORASTOR) 250 MG capsule Take 250 mg by mouth daily as needed (when taking antibiotics).   Yes [provider]  sodium chloride (OCEAN) 0.65 % SOLN nasal spray Place 1 spray into both nostrils as needed for congestion.   Yes [provider]  tamsulosin (FLOMAX) 0.4 MG CAPS capsule Take 0.4 mg by mouth in the morning and at bedtime.   Yes [provider]  Testosterone Cypionate 200 MG/ML SOLN Inject 100 Units into the muscle once a week. Fridays   Yes [provider]  venlafaxine (EFFEXOR-XR) 150 MG 24 hr capsule Take 150 mg by mouth daily with breakfast.   Yes [provider]  NON FORMULARY Pt uses a cpap nightly    [provider]    Physical Exam: Vitals:   10/26/21 0700 10/26/21 0744 10/26/21 0816 10/26/21 0830  BP: 108/61 91/68 (!) 129/109 (!) 80/65  Pulse: (!) 123 (!) 117 (!) 109 (!) 102  Resp: (!) 25 (!) 22 (!) 21 19  Temp:  99.8 F (37.7 C)    TempSrc:  Oral    SpO2: 93% 95% 96% 93%  Weight:      Height:        General: 71 y.o. male resting in bed in NAD ENMT: Nares patent w/o discharge, orophaynx clear, dentition normal, ears w/o discharge/lesions/ulcers Neck: Supple, trachea midline Cardiovascular: tachy, +S1, S2, no m/g/r,  equal pulses throughout Respiratory: decreased at bases, no w/r/r, normal WOB GI: BS+, NDNT, soft, no masses noted, no organomegaly noted MSK: No e/c/c Neuro: A&O x 3, no focal deficits Psyc: Appropriate interaction and affect, calm/cooperative  Labs on Admission:  I have personally reviewed following labs and imaging studies  CBC: Recent Labs  Lab 10/26/21 0440  WBC 3.5*  NEUTROABS 3.2  HGB 15.4  HCT 45.0  MCV 94.1  PLT 96*   Basic Metabolic Panel: Recent Labs  Lab 10/26/21 0440  NA 131*  K 3.7  CL 96*  CO2 24  GLUCOSE 107*  BUN 32*  CREATININE 1.61*  CALCIUM 8.6*   GFR: Estimated Creatinine Clearance: 54.3 mL/min (A) (by C-G formula based on SCr of 1.61 mg/dL (H)). Liver Function Tests: Recent Labs  Lab 10/26/21 0440  AST 27  ALT 23  ALKPHOS 54  BILITOT 1.2  PROT 7.0  ALBUMIN 3.7   No results for input(s): LIPASE, AMYLASE in the last 168 hours. No results for input(s): AMMONIA in the last 168 hours. Coagulation Profile: Recent Labs  Lab 10/26/21 0440  INR 2.5*   Cardiac Enzymes: No results for input(s): CKTOTAL, CKMB, CKMBINDEX, TROPONINI in the last 168 hours. BNP (last 3 results) No results for input(s): PROBNP in the last 8760 hours. HbA1C: No results for input(s): HGBA1C in the last 72 hours. CBG: No results for input(s): GLUCAP in the last 168 hours. Lipid Profile: No results for input(s): CHOL, HDL, LDLCALC, TRIG, CHOLHDL, LDLDIRECT in the last 72 hours. Thyroid Function Tests: No results for input(s): TSH, T4TOTAL, FREET4, T3FREE, THYROIDAB in the last 72 hours. Anemia Panel: No results for input(s): VITAMINB12, FOLATE, FERRITIN, TIBC, IRON, RETICCTPCT in the last 72 hours. Urine analysis:    Component Value Date/Time   COLORURINE YELLOW 10/26/2021 [place] 10/26/2021 0439   LABSPEC 1.018 10/26/2021 0439   PHURINE 5.0 10/26/2021 0439   GLUCOSEU NEGATIVE 10/26/2021 0439   HGBUR NEGATIVE 10/26/2021 0439   BILIRUBINUR  NEGATIVE 10/26/2021 0439   KETONESUR NEGATIVE 10/26/2021 0439   PROTEINUR NEGATIVE 10/26/2021 0439   UROBILINOGEN 0.2 01/11/2008 1105   NITRITE NEGATIVE 10/26/2021 0439   LEUKOCYTESUR NEGATIVE 10/26/2021 0439    Radiological Exams on Admission: CT ABDOMEN PELVIS WO CONTRAST  Result Date: 10/26/2021 CLINICAL DATA:  71 year old male with sepsis. Positive COVID-19. EXAM: CT CHEST, ABDOMEN AND PELVIS WITHOUT CONTRAST TECHNIQUE: Multidetector CT imaging of the chest, abdomen and pelvis was performed following the standard protocol without IV contrast. COMPARISON:  CTA Chest, Abdomen, and Pelvis 10/10/2020. Chest radiographs 0447 hours today and earlier. FINDINGS: CT CHEST FINDINGS Cardiovascular: Prior CABG. Chronic aortic valve replacement. Normal caliber thoracic aorta, stable mildly tortuous descending aorta. No cardiomegaly or pericardial effusion. Vascular patency is not evaluated in the absence of IV contrast. Mediastinum/Nodes: Negative aside from post CABG postoperative changes. Lungs/Pleura: Mild respiratory motion. Stable lung volumes from 2021. Major airways are patent. Left lung base mild atelectasis or scarring not significantly changed from 2021. No consolidation. No pleural effusion. No convincing acute lung inflammation. Musculoskeletal: Chronic ununited sternotomy. 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 No acute osseous abnormality identified. CT ABDOMEN PELVIS FINDINGS Hepatobiliary: Chronically absent gallbladder. Negative noncontrast liver. Pancreas: Negative. Spleen: Negative. Adrenals/Urinary Tract: Normal adrenal glands. Nonobstructed kidneys. Symmetric pararenal inflammatory stranding is nonspecific and not significantly changed from 2021. No nephrolithiasis. No hydroureter. Diminutive bladder. Incidental pelvic phleboliths. Stomach/Bowel: Negative rectum. Previous partial distal colectomy is new from 2021.  There is a sigmoid anastomosis at the pelvic inlet with no adverse features (series 2, image 102). Upstream residual fairly extensive diverticulosis, but no convincing active inflammation. Retained stool in redundant transverse colon. Mild diverticulosis at the hepatic flexure.  Negative right colon. Decompressed terminal ileum. Diminutive or absent appendix. No dilated small bowel. Fluid filled but nondilated stomach and duodenum. No free air. No free fluid. No convincing mesenteric stranding. Vascular/Lymphatic: Normal caliber abdominal aorta. Aortoiliac calcified atherosclerosis. Vascular patency is not evaluated in the absence of IV contrast. No lymphadenopathy identified. Reproductive: Chronic prostatomegaly appears stable. Other: No pelvic free fluid. 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 No acute osseous abnormality identified. IMPRESSION: 1. No acute or inflammatory process identified in the non-contrast chest, abdomen, or pelvis. 2. Left lung base scarring. Previous CABG, aortic valve replacement, and partial sigmoid colectomy. Diverticulosis of the residual colon with no convincing active inflammation. 3. Aortic Atherosclerosis (ICD10-I70.0). Electronically Signed   By: Genevie Ann M.D.   On: 10/26/2021 07:30   CT Chest Wo Contrast  Result Date: 10/26/2021 CLINICAL DATA:  71 year old male with sepsis. Positive COVID-19. EXAM: CT CHEST, ABDOMEN AND PELVIS WITHOUT CONTRAST TECHNIQUE: Multidetector CT imaging of the chest, abdomen and pelvis was performed following the standard protocol without IV contrast. COMPARISON:  CTA Chest, Abdomen, and Pelvis 10/10/2020. Chest radiographs 0447 hours today and earlier. FINDINGS: CT CHEST FINDINGS Cardiovascular: Prior CABG. Chronic aortic valve replacement. Normal caliber thoracic aorta, stable mildly tortuous descending aorta. No cardiomegaly or pericardial effusion. Vascular patency is not evaluated in the absence of IV  contrast. Mediastinum/Nodes: Negative aside from post CABG postoperative changes. Lungs/Pleura: Mild respiratory motion. Stable lung volumes from 2021. Major airways are patent. Left lung base mild atelectasis or scarring not significantly changed from 2021. No consolidation. No pleural effusion. No convincing acute lung inflammation. Musculoskeletal: Chronic ununited sternotomy. 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 No acute osseous abnormality identified. CT ABDOMEN PELVIS FINDINGS Hepatobiliary: Chronically absent gallbladder. Negative noncontrast liver. Pancreas: Negative. Spleen: Negative. Adrenals/Urinary Tract: Normal adrenal glands. Nonobstructed kidneys. Symmetric pararenal inflammatory stranding is nonspecific and not significantly changed from 2021. No nephrolithiasis. No hydroureter. Diminutive bladder. Incidental pelvic phleboliths. Stomach/Bowel: Negative rectum. Previous partial distal colectomy is new from 2021. There is a sigmoid anastomosis at the pelvic inlet with no adverse features (series 2, image 102). Upstream residual fairly extensive diverticulosis, but no convincing active inflammation. Retained stool in redundant transverse colon. Mild diverticulosis at the hepatic flexure. Negative right colon. Decompressed terminal ileum. Diminutive or absent appendix. No dilated small bowel. Fluid filled but nondilated stomach and duodenum. No free air. No free fluid. No convincing mesenteric stranding. Vascular/Lymphatic: Normal caliber abdominal aorta. Aortoiliac calcified atherosclerosis. Vascular patency is not evaluated in the absence of IV contrast. No lymphadenopathy identified. Reproductive: Chronic prostatomegaly appears stable. Other: No pelvic free fluid. 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 No acute osseous abnormality identified. IMPRESSION: 1. No acute or  inflammatory process identified in the non-contrast chest, abdomen, or pelvis. 2. Left lung base scarring. Previous CABG, aortic valve replacement, and partial sigmoid colectomy. Diverticulosis of the residual colon with no convincing active inflammation. 3. Aortic Atherosclerosis (ICD10-I70.0). Electronically Signed   By: Genevie Ann M.D.   On: 10/26/2021 07:30   DG Chest Port 1 View  Result Date: 10/26/2021 CLINICAL DATA:  Possible sepsis, nonproductive cough, tachycardia. EXAM: PORTABLE CHEST 1 VIEW COMPARISON:  08/20/2021. FINDINGS: The heart size and mediastinal contours are within normal limits. Both lungs are clear. No acute osseous abnormality. Sternotomy wires are present over the midline. Surgical clips are noted in the axillary region on the right. IMPRESSION: No acute cardiopulmonary process. Electronically Signed   By:  Brett Fairy M.D.   On: 10/26/2021 04:55    EKG: Independently reviewed. Sinus tach, minimal st elevation V4, V6  Assessment/Plan Sepsis of unknown origin     - admit to inpt, progressive     - CXR, CT chest/ab/pelvis all negative for acute process; there is some chronic pararenal stranding that is non-specific     - COVID positive; spoke with ID as this patient finished Tx for COVID a couple of weeks ago, this is not likely reinfection     - lactic acid improved with fluids, continue     - on broad spec abx, continue     - follow UCx, Bld Cx     - check RVP  AKI     - likely from hypotension     - no obstruction seen on CT     - fluids, follow  Hx of HTN Hypotensive     - holding home meds     - continue fluids     - stress dose cortisol for today  Leukopenia Thrombocytopenia     - as a function of above     - no evidence of bleed     - check peripheral smear; follow  Hypothyroidism     - resume home regimen  OSA     - CPAP  A fib on xarelto AVR     - continue xarelto  Depression     - continue home regimen  Asthmatic bronchitis     - continue  home regimen  DVT prophylaxis: xarelto  Code Status: DNR  Family Communication: w/ wife at bedside  Consults called: None   Status is: Inpatient  Remains inpatient appropriate because: severity of illness  Audrick Lamoureaux A Marylyn Ishihara DO [facility]  If 7PM-7AM, please contact night-coverage [email or web address]  10/26/2021, 9:33 AM

## 2021-10-26 NOTE — Progress Notes (Signed)
Pharmacy Antibiotic Note  Grant [name] is a 71 y.o. male admitted on 10/26/2021 with  sepsis of unknown origin .  Pharmacy has been consulted for Vancomycin and Cefepime dosing.  Plan: Vancomycin total 2g IV given in the ED. Continue with Vancomycin 1250mg  IV q24h for AUC 400-550 Vancomycin levels at steady state, as indicated Cefepime 2g IV q12h Metronidazole 500mg  IV q12h per MD Monitor renal function, cultures, clinical course  Height: 5\' 10"  (177.8 cm) Weight: 108.9 kg (240 lb) IBW/kg (Calculated) : 73  Temp (24hrs), Avg:101.5 F (38.6 C), Min:99.8 F (37.7 C), Max:103.1 F (39.5 C)  Recent Labs  Lab 10/26/21 0439 10/26/21 0440 10/26/21 0638  WBC  --  3.5*  --   CREATININE  --  1.61*  --   LATICACIDVEN 2.6*  --  1.8    Estimated Creatinine Clearance: 54.3 mL/min (A) (by C-G formula based on SCr of 1.61 mg/dL (H)).    Allergies  Allergen Reactions   Perphenazine Swelling    Tongue swelling   Amiodarone Swelling   Codeine Itching   Tramadol Other (See Comments)    dellusion     Antimicrobials this admission: 1/7 Vancomycin >> 1/7 Cefepime >> 1/7 Metronidazole >>  Dose adjustments this admission: --  Microbiology results: 1/7 BCx:  1/7 UCx:   1/7 Respiratory panel:   Thank you for allowing pharmacy to be a part of this patients care.   Lindell Spar, PharmD, BCPS Clinical Pharmacist  10/26/2021 12:34 PM

## 2021-10-26 NOTE — Progress Notes (Signed)
PHARMACY - PHYSICIAN COMMUNICATION CRITICAL VALUE ALERT - BLOOD CULTURE IDENTIFICATION (BCID)  Grant [name] is an 71 y.o. male who presented to [facility] on 10/26/2021 with sepsis of unknown origin.  Assessment:  BCID + for Streptococcus species in 2 out of 4 bottles (positive in both anaerobic bottles) .   Name of physician (or Provider) Contacted: X. Blount, FNP  Current antibiotics: Cefepime, Vancomycin, Metronidazole  Changes to prescribed antibiotics recommended:  No de-escalation at this time.  On call provider to let attending determine when to de-escalate antibiotics. No change in antibiotics at this time.  Results for orders placed or performed during the hospital encounter of 10/26/21  Blood Culture ID Panel (Reflexed) (Collected: 10/26/2021  4:40 AM)  Result Value Ref Range   Enterococcus faecalis NOT DETECTED NOT DETECTED   Enterococcus Faecium NOT DETECTED NOT DETECTED   Listeria monocytogenes NOT DETECTED NOT DETECTED   Staphylococcus species NOT DETECTED NOT DETECTED   Staphylococcus aureus (BCID) NOT DETECTED NOT DETECTED   Staphylococcus epidermidis NOT DETECTED NOT DETECTED   Staphylococcus lugdunensis NOT DETECTED NOT DETECTED   Streptococcus species DETECTED (A) NOT DETECTED   Streptococcus agalactiae NOT DETECTED NOT DETECTED   Streptococcus pneumoniae NOT DETECTED NOT DETECTED   Streptococcus pyogenes NOT DETECTED NOT DETECTED   A.calcoaceticus-baumannii NOT DETECTED NOT DETECTED   Bacteroides fragilis NOT DETECTED NOT DETECTED   Enterobacterales NOT DETECTED NOT DETECTED   Enterobacter cloacae complex NOT DETECTED NOT DETECTED   Escherichia coli NOT DETECTED NOT DETECTED   Klebsiella aerogenes NOT DETECTED NOT DETECTED   Klebsiella oxytoca NOT DETECTED NOT DETECTED   Klebsiella pneumoniae NOT DETECTED NOT DETECTED   Proteus species NOT DETECTED NOT DETECTED   Salmonella species NOT DETECTED NOT DETECTED   Serratia marcescens NOT DETECTED NOT DETECTED    Haemophilus influenzae NOT DETECTED NOT DETECTED   Neisseria meningitidis NOT DETECTED NOT DETECTED   Pseudomonas aeruginosa NOT DETECTED NOT DETECTED   Stenotrophomonas maltophilia NOT DETECTED NOT DETECTED   Candida albicans NOT DETECTED NOT DETECTED   Candida auris NOT DETECTED NOT DETECTED   Candida glabrata NOT DETECTED NOT DETECTED   Candida krusei NOT DETECTED NOT DETECTED   Candida parapsilosis NOT DETECTED NOT DETECTED   Candida tropicalis NOT DETECTED NOT DETECTED   Cryptococcus neoformans/gattii NOT DETECTED NOT DETECTED    Everette Rank, PharmD 10/26/2021  11:36 PM

## 2021-10-26 NOTE — ED Provider Notes (Signed)
[facility] DEPT Provider Note   CSN: [id number] Arrival date & time: 10/26/21  0357     History  Chief Complaint  Patient presents with   Code Sepsis    Grant [name] is a 71 y.o. male.  HPI Patient is a 71 year old male with history of atrial fibrillation, abnormal involuntary movements, status post Bentall procedure, who presents to the emergency department due to weakness.  His wife states that patient was experiencing bronchitis last month.  He was prescribed a 1 week course of amoxicillin and then another week of doxycycline.  She states that he was subsequently diagnosed with COVID-19 as well and was started on Lavegrio for this which he completed.  She states that after completing this he still was showing signs of coughing and congestion and states that this is not improved.  Feels that he has having increased work of breathing.  Also notes that he was complaining of abdominal pain recently and she feels that his abdomen is more distended than normal.  She notes that patient has become increasingly constipated over the past few days.  Lastly, she states that patient has been experiencing urinary frequency.  There is concern that he is retaining urine and his Flomax was recently doubled.  She states that the patient recently told her that his urine turned orange a few days ago.  She states that he is typically oriented but has difficulty with his ADLs.  She discussed his symptoms with his PCP who started him on another course of doxycycline yesterday.  Per EMS, patient found to be desaturating to around 90% on room air.  Patient has no home oxygen requirement but does wear CPAP at night for OSA.  He was started on 2 L via nasal cannula with his oxygen saturations improving to the high 90s.  Patient currently A&O x3.  Involuntarily moving his mouth and all 4 extremities.  Denies any physical complaints to me at this time.     Home Medications Prior to  Admission medications   Medication Sig Start Date End Date Taking? Authorizing Provider  albuterol (VENTOLIN HFA) 108 (90 Base) MCG/ACT inhaler INHALE 2 PUFFS INTO THE LUNGS EVERY 6 HOURS AS NEEDED FOR WHEEZING OR SHORTNESS OF BREATH 09/30/21   Tanda Rockers, MD  ALPRAZolam Duanne Moron) 0.25 MG tablet Take 1 tablet (0.25 mg total) by mouth 3 (three) times daily as needed for anxiety. 10/13/20   Aline August, MD  aluminum hydroxide-magnesium carbonate (GAVISCON) 95-358 MG/15ML SUSP Take 15 mLs by mouth as needed for indigestion or heartburn.    [provider]  amoxicillin (AMOXIL) 500 MG capsule Take 4 capsules (2,000 mg total) by mouth See admin instructions. 1 hour prior to dental procedure 10/03/20   [name], MD  azelastine (ASTELIN) 137 MCG/SPRAY nasal spray Place 1-2 sprays into the nose 2 (two) times daily as needed for rhinitis or allergies.     [provider]  benzonatate (TESSALON) 200 MG capsule Take 1 capsule (200 mg total) by mouth 3 (three) times daily as needed for cough. 08/20/21   Parrett, Fonnie Mu, NP  budesonide-formoterol (SYMBICORT) 160-4.5 MCG/ACT inhaler Inhale 2 puffs into the lungs in the morning and at bedtime. 03/19/21   Parrett, Fonnie Mu, NP  calcium carbonate (TUMS - DOSED IN MG ELEMENTAL CALCIUM) 500 MG chewable tablet Chew 500-1,000 mg by mouth daily as needed for indigestion or heartburn.    [provider]  chlorpheniramine (CHLOR-TRIMETON) 4 MG tablet Take 4 mg  by mouth every 6 (six) hours as needed for allergies.    [provider]  COLCRYS 0.6 MG tablet Take 0.6 mg by mouth in the morning. 01/29/12   [provider]  Dextromethorphan-guaiFENesin (MUCINEX DM MAXIMUM STRENGTH) 60-1200 MG TB12 Take 1 tablet by mouth 2 (two) times daily as needed (congestion/cough).    [provider]  diclofenac Sodium (VOLTAREN) 1 % GEL Apply 2 g topically daily as needed (pain).    [provider]  diphenhydrAMINE  (BENADRYL) 25 MG tablet Take 50 mg by mouth daily as needed for allergies.    [provider]  EPINEPHrine 0.3 mg/0.3 mL IJ SOAJ injection Inject 0.3 mLs (0.3 mg total) into the muscle as needed (for angioedema). 02/26/18   Josue Hector, MD  Erenumab-aooe (AIMOVIG) 140 MG/ML SOAJ Inject 140 mg into the skin every 28 (twenty-eight) days. 07/09/21   Cameron Sprang, MD  Ferrous Sulfate (IRON) 142 (45 Fe) MG TBCR Take 45 mg by mouth in the morning and at bedtime.    [provider]  finasteride (PROSCAR) 5 MG tablet Take 5 mg by mouth at bedtime.  01/14/14   [provider]  levothyroxine (SYNTHROID, LEVOTHROID) 112 MCG tablet Take 112 mcg by mouth daily before breakfast.     [provider]  liothyronine (CYTOMEL) 25 MCG tablet Take 25 mcg by mouth daily before breakfast.    [provider]  losartan (COZAAR) 50 MG tablet TAKE 1 TABLET BY MOUTH EVERY DAY Patient taking differently: Take 50 mg by mouth daily. 10/01/20   [name], MD  magnesium oxide (MAG-OX) 400 MG tablet Take 400 mg by mouth daily.    [provider]  Menthol, Topical Analgesic, (BLUE-EMU MAXIMUM STRENGTH EX) Apply 1 application topically as needed (pain).    [provider]  Methylcellulose, Laxative, (FIBER THERAPY) 500 MG TABS Take 500-1,000 mg by mouth daily.    [provider]  metoprolol tartrate (LOPRESSOR) 50 MG tablet Take 1 tablet (50 mg total) by mouth 2 (two) times daily. 06/04/21   Josue Hector, MD  Multiple Vitamin (MULTIVITAMIN WITH MINERALS) TABS tablet Take 1 tablet by mouth daily.    [provider]  NON FORMULARY Pt uses a cpap nightly    [provider]  olopatadine (PATANOL) 0.1 % ophthalmic solution Place 1 drop into both eyes 2 (two) times daily as needed for allergies.    [provider]  omeprazole (PRILOSEC) 40 MG capsule Take 1 capsule (40 mg total) by mouth 2 (two) times daily. 10/31/16   Josue Hector, MD  rivaroxaban (XARELTO) 20 MG TABS tablet Take 1 tablet (20 mg total) by mouth daily with supper. 06/04/21   Josue Hector, MD  rizatriptan (MAXALT) 10 MG tablet Take 1 tablet (10 mg total) by mouth as needed for migraine. May repeat in 2 hours if needed 07/09/21   Cameron Sprang, MD  saccharomyces boulardii (FLORASTOR) 250 MG capsule Take 250 mg by mouth daily as needed (when taking antibiotics).    [provider]  sodium chloride (OCEAN) 0.65 % SOLN nasal spray Place 1 spray into both nostrils as needed for congestion.    [provider]  tamsulosin (FLOMAX) 0.4 MG CAPS capsule Take 0.4 mg by mouth daily after supper.    [provider]  Testosterone Cypionate 200 MG/ML SOLN Inject 100 Units into the muscle every Tuesday.    [provider]  venlafaxine (EFFEXOR-XR) 150 MG 24 hr  capsule Take 150 mg by mouth daily with breakfast.    [provider]      Allergies    Perphenazine, Amiodarone, Codeine, and Tramadol    Review of Systems   Review of Systems  Unable to perform ROS: Acuity of condition   Physical Exam Updated Vital Signs BP 122/79    Pulse (!) 122    Temp (!) 103.1 F (39.5 C) (Oral)    Resp (!) 26    Ht 5\' 10"  (1.778 m)    Wt 108.9 kg    SpO2 91%    BMI 34.44 kg/m  Physical Exam Vitals and nursing note reviewed.  Constitutional:      General: He is not in acute distress.    Appearance: Normal appearance. He is diaphoretic. He is not ill-appearing or toxic-appearing.  HENT:     Head: Normocephalic and atraumatic.     Right Ear: External ear normal.     Left Ear: External ear normal.     Nose: Nose normal.     Mouth/Throat:     Mouth: Mucous membranes are moist.     Pharynx: Oropharynx is clear. No oropharyngeal exudate or posterior oropharyngeal erythema.  Eyes:     General: No scleral icterus.       Right eye: No discharge.        Left eye: No discharge.     Extraocular Movements: Extraocular movements intact.      Conjunctiva/sclera: Conjunctivae normal.  Cardiovascular:     Rate and Rhythm: Regular rhythm. Tachycardia present.     Pulses: Normal pulses.     Heart sounds: Normal heart sounds. No murmur heard.   No friction rub. No gallop.  Pulmonary:     Effort: Pulmonary effort is normal. No respiratory distress.     Breath sounds: Normal breath sounds. No stridor. No wheezing, rhonchi or rales.  Abdominal:     General: Abdomen is flat.     Palpations: Abdomen is soft.     Tenderness: There is no abdominal tenderness.     Comments: Protuberant abdomen that is soft.  Patient does not exhibit signs of tenderness with deep palpation.  Musculoskeletal:        General: Normal range of motion.     Cervical back: Normal range of motion and neck supple. No tenderness.  Skin:    General: Skin is warm.  Neurological:     General: No focal deficit present.     Mental Status: [name], place, and time.     Comments: A&O x3.  Involuntarily moving his mouth as well as all 4 extremities.   ED Results / Procedures / Treatments   Labs (all labs ordered are listed, but only abnormal results are displayed) Labs Reviewed  RESP PANEL BY RT-PCR (FLU A&B, COVID) ARPGX2 - Abnormal; Notable for the following components:      Result Value   SARS Coronavirus 2 by RT PCR POSITIVE (*)    All other components within normal limits  LACTIC ACID, PLASMA - Abnormal; Notable for the following components:   Lactic Acid, Venous 2.6 (*)    All other components within normal limits  COMPREHENSIVE METABOLIC PANEL - Abnormal; Notable for the following components:   Sodium 131 (*)    Chloride 96 (*)    Glucose, Bld 107 (*)    BUN 32 (*)    Creatinine, Ser 1.61 (*)    Calcium 8.6 (*)    GFR, Estimated  46 (*)    All other components within normal limits  CBC WITH DIFFERENTIAL/PLATELET - Abnormal; Notable for the following components:   WBC 3.5 (*)    Platelets 96 (*)    Lymphs Abs 0.1 (*)     All other components within normal limits  PROTIME-INR - Abnormal; Notable for the following components:   Prothrombin Time 26.9 (*)    INR 2.5 (*)    All other components within normal limits  APTT - Abnormal; Notable for the following components:   aPTT 48 (*)    All other components within normal limits  CULTURE, BLOOD (ROUTINE X 2)  CULTURE, BLOOD (ROUTINE X 2)  URINE CULTURE  URINALYSIS, ROUTINE W REFLEX MICROSCOPIC  LACTIC ACID, PLASMA   EKG EKG Interpretation  Date/Time:  Saturday October 26 2021 04:42:08 EST Ventricular Rate:  133 PR Interval:  120 QRS Duration: 117 QT Interval:  345 QTC Calculation: 514 R Axis:   -50 Text Interpretation: Sinus or ectopic atrial tachycardia Atrial premature complex Left anterior fascicular block Probable left ventricular hypertrophy Nonspecific T abnormalities, lateral leads ST elevation, consider anterolateral injury Confirmed by Quintella Reichert [phone]) on 10/26/2021 5:52:05 AM  Radiology DG Chest Port 1 View  Result Date: 10/26/2021 CLINICAL DATA:  Possible sepsis, nonproductive cough, tachycardia. EXAM: PORTABLE CHEST 1 VIEW COMPARISON:  08/20/2021. FINDINGS: The heart size and mediastinal contours are within normal limits. Both lungs are clear. No acute osseous abnormality. Sternotomy wires are present over the midline. Surgical clips are noted in the axillary region on the right. IMPRESSION: No acute cardiopulmonary process. Electronically Signed   By: Brett Fairy M.D.   On: 10/26/2021 04:55    Procedures .Critical Care Performed by: Rayna Sexton, PA-C Authorized by: Rayna Sexton, PA-C   Critical care provider statement:    Critical care time (minutes):  45   Critical care was necessary to treat or prevent imminent or life-threatening deterioration of the following conditions:  Sepsis   Critical care was time spent personally by me on the following activities:  Development of treatment plan with patient or surrogate,  discussions with consultants, evaluation of patient's response to treatment, examination of patient, ordering and review of laboratory studies, ordering and review of radiographic studies, ordering and performing treatments and interventions, pulse oximetry, re-evaluation of patient's condition and review of old charts   Medications Ordered in ED Medications  lactated ringers infusion ( Intravenous New Bag/Given 10/26/21 0451)  vancomycin (VANCOCIN) IVPB 1000 mg/200 mL premix (1,000 mg Intravenous New Bag/Given 10/26/21 0530)  vancomycin (VANCOCIN) IVPB 1000 mg/200 mL premix (has no administration in time range)  lactated ringers bolus 2,190 mL (2,190 mLs Intravenous New Bag/Given 10/26/21 0450)  acetaminophen (TYLENOL) tablet 1,000 mg (1,000 mg Oral Given 10/26/21 0450)  ceFEPIme (MAXIPIME) 2 g in sodium chloride 0.9 % 100 mL IVPB (0 g Intravenous Stopped 10/26/21 0623)  metroNIDAZOLE (FLAGYL) IVPB 500 mg (500 mg Intravenous New Bag/Given 10/26/21 0528)   ED Course/ Medical Decision Making/ A&P Clinical Course as of 10/26/21 0630  Sat Oct 26, 2021  0422 Ideal body weight calculated to be 73 kg.  30 cc/kg bolus equates to 2190 cc. [LJ]  0554 SARS Coronavirus 2 by RT PCR(!): POSITIVE Patient positive for COVID-19 last month.  Likely not an acute infection.   [LJ]    Clinical Course User Index [LJ] Rayna Sexton, PA-C  Medical Decision Making Pt is a 71 y.o. male who presents to the emergency department with his wife with multiple complaints.  Patient found to be febrile and tachycardic upon arrival.  Appears septic.   Labs: CBC with a white count of 3.5, platelets 296, lymphocytes of 0.1. CMP with a sodium of 131, chloride of 96, glucose 107, BUN of 32, creatinine 1.61, calcium of 8.6, GFR 46. Prothrombin time at 26.9 with an INR of 2.5. APTT of 48. UA is negative. Urine culture sent. Blood cultures obtained. Respiratory panel is positive for  COVID-19.  Imaging: Chest x-ray shows no acute cardiopulmonary process.  I, Rayna Sexton, PA-C, personally reviewed and evaluated these images and lab results as part of my medical decision-making.  Patient appears A&O x3 at this time.  His wife notes that recently he has had orange urine, abdominal pain, as well as a persistent cough.  She states that he was diagnosed with COVID-19 last month and treated with multiple courses of antibiotics as well as antivirals.  Lab work shows leukopenia of 3.5 and thrombocytopenia of 96.  Elevated PT/INR as well as APTT.  Urine does not appear infectious.  Blood cultures obtained.  Respiratory panel is positive for COVID-19.  Given his recent COVID-19 diagnosis do not feel that this is likely an acute infection.  Chest x-ray is negative.  CMP with a sodium of 131 and chloride of 96.  Elevated creatinine of 1.61 with a GFR of 46.  Appears to be elevated from patient's baseline.  Likely AKI.  Patient treated for sepsis with IV fluid bolus, antipyretics, as well as broad-spectrum antibiotics.  Patient still tachycardic but heart rate has improved.  Blood pressure initially low at 92/69 but now improved at 122/79.  Reassuring maps.  Unsure the source of the patient's fever.  We will obtain a CT scan of the chest, abdomen, and pelvis.  It is the end of my shift and patient care is being transferred to [facility].  Patient pending remaining lab work as well as CT scans.  When these result, patient will require admission to medicine for further management.  Note: Portions of this report may have been transcribed using voice recognition software. Every effort was made to ensure accuracy; however, inadvertent computerized transcription errors may be present.   Final Clinical Impression(s) / ED Diagnoses Final diagnoses:  Septic shock ([place])  COVID-19  AKI (acute kidney injury) [facility])   Rx / DC Orders ED Discharge Orders     None        Rayna Sexton,  PA-C 10/26/21 0630    Quintella Reichert, MD 10/27/21 [phone]

## 2021-10-26 NOTE — ED Triage Notes (Signed)
Pt arrives EMS from home with c/o worsening involuntary movements. Upon arrival pt noted to have nonproductive cough, tachycardia, low 02 sats, and hypotension. Pt orientation at baseline.   Took xanax 45 minutes pta.    89% ra -> placed on 2L [place]  130 hr  152/palpated

## 2021-10-27 ENCOUNTER — Other Ambulatory Visit: Payer: Self-pay

## 2021-10-27 DIAGNOSIS — A419 Sepsis, unspecified organism: Secondary | ICD-10-CM | POA: Diagnosis not present

## 2021-10-27 DIAGNOSIS — I480 Paroxysmal atrial fibrillation: Secondary | ICD-10-CM

## 2021-10-27 DIAGNOSIS — U071 COVID-19: Secondary | ICD-10-CM | POA: Diagnosis not present

## 2021-10-27 DIAGNOSIS — N179 Acute kidney failure, unspecified: Secondary | ICD-10-CM | POA: Diagnosis not present

## 2021-10-27 DIAGNOSIS — G4733 Obstructive sleep apnea (adult) (pediatric): Secondary | ICD-10-CM

## 2021-10-27 DIAGNOSIS — B955 Unspecified streptococcus as the cause of diseases classified elsewhere: Secondary | ICD-10-CM | POA: Diagnosis present

## 2021-10-27 DIAGNOSIS — R7881 Bacteremia: Secondary | ICD-10-CM | POA: Diagnosis present

## 2021-10-27 LAB — CBC
HCT: 34.6 % — ABNORMAL LOW (ref 39.0–52.0)
Hemoglobin: 11.9 g/dL — ABNORMAL LOW (ref 13.0–17.0)
MCH: 32.7 pg (ref 26.0–34.0)
MCHC: 34.4 g/dL (ref 30.0–36.0)
MCV: 95.1 fL (ref 80.0–100.0)
Platelets: 91 10*3/uL — ABNORMAL LOW (ref 150–400)
RBC: 3.64 MIL/uL — ABNORMAL LOW (ref 4.22–5.81)
RDW: 12.9 % (ref 11.5–15.5)
WBC: 10.3 10*3/uL (ref 4.0–10.5)
nRBC: 0 % (ref 0.0–0.2)

## 2021-10-27 LAB — PROTIME-INR
INR: 1.9 — ABNORMAL HIGH (ref 0.8–1.2)
Prothrombin Time: 22.1 seconds — ABNORMAL HIGH (ref 11.4–15.2)

## 2021-10-27 LAB — COMPREHENSIVE METABOLIC PANEL
ALT: 21 U/L (ref 0–44)
AST: 28 U/L (ref 15–41)
Albumin: 2.6 g/dL — ABNORMAL LOW (ref 3.5–5.0)
Alkaline Phosphatase: 35 U/L — ABNORMAL LOW (ref 38–126)
Anion gap: 5 (ref 5–15)
BUN: 21 mg/dL (ref 8–23)
CO2: 26 mmol/L (ref 22–32)
Calcium: 8.3 mg/dL — ABNORMAL LOW (ref 8.9–10.3)
Chloride: 105 mmol/L (ref 98–111)
Creatinine, Ser: 0.88 mg/dL (ref 0.61–1.24)
GFR, Estimated: >60 mL/min (ref >60)
Glucose, Bld: 187 mg/dL — ABNORMAL HIGH (ref 70–99)
Potassium: 3.3 mmol/L — ABNORMAL LOW (ref 3.5–5.1)
Sodium: 136 mmol/L (ref 135–145)
Total Bilirubin: 0.5 mg/dL (ref 0.3–1.2)
Total Protein: 5.3 g/dL — ABNORMAL LOW (ref 6.5–8.1)

## 2021-10-27 LAB — URINE CULTURE: Culture: <10000 — AB

## 2021-10-27 LAB — HIV ANTIBODY (ROUTINE TESTING W REFLEX): HIV Screen 4th Generation wRfx: NONREACTIVE

## 2021-10-27 LAB — PROCALCITONIN: Procalcitonin: 54.74 ng/mL

## 2021-10-27 LAB — CORTISOL-AM, BLOOD: Cortisol - AM: 53.9 ug/dL — ABNORMAL HIGH (ref 6.7–22.6)

## 2021-10-27 MED ORDER — METOPROLOL TARTRATE 25 MG PO TABS
12.5000 mg | ORAL_TABLET | Freq: Two times a day (BID) | ORAL | Status: DC
  Administered 2021-10-27 – 2021-10-29 (×4): 12.5 mg via ORAL
  Filled 2021-10-27 (×4): qty 1

## 2021-10-27 MED ORDER — POTASSIUM CHLORIDE IN NACL 40-0.9 MEQ/L-% IV SOLN
INTRAVENOUS | Status: DC
  Filled 2021-10-27 (×2): qty 1000

## 2021-10-27 MED ORDER — ZOLPIDEM TARTRATE 5 MG PO TABS
5.0000 mg | ORAL_TABLET | Freq: Every day | ORAL | Status: DC
  Administered 2021-10-27 – 2021-11-01 (×6): 5 mg via ORAL
  Filled 2021-10-27 (×7): qty 1

## 2021-10-27 MED ORDER — SODIUM CHLORIDE 0.9 % IV SOLN
2.0000 g | INTRAVENOUS | Status: DC
  Administered 2021-10-27 – 2021-10-29 (×3): 2 g via INTRAVENOUS
  Filled 2021-10-27 (×3): qty 20

## 2021-10-27 MED ORDER — TAMSULOSIN HCL 0.4 MG PO CAPS
0.4000 mg | ORAL_CAPSULE | Freq: Two times a day (BID) | ORAL | Status: DC
  Administered 2021-10-27 – 2021-10-29 (×4): 0.4 mg via ORAL
  Filled 2021-10-27 (×4): qty 1

## 2021-10-27 NOTE — Progress Notes (Signed)
Offered IS to pt to aide in pulmonary toileting, pt deferred using at the present time. Will continue to encourage IS usage. SRP,RN

## 2021-10-27 NOTE — Progress Notes (Signed)
PROGRESS NOTE    Grant [name]  QMV:[id number] DOB: May 09, 1951 DOA: 10/26/2021 PCP: Shirline Frees, MD    Brief Narrative:  71 year old male with a history of atrial fibrillation on Xarelto, recent COVID infection, hypothyroidism, presented with worsening fatigue, decreased p.o. intake, fevers and shaking chills that began approximately 3 to 4 days prior to admission.  He was found to have streptococcal bacteremia.  Source of bacteremia is not entirely clear.  He has been started on IV antibiotics and admitted for further work-up.   Assessment & Plan:   Principal Problem:   Sepsis ([place]) Active Problems:   PAF (paroxysmal atrial fibrillation) (HCC)   Chronic anticoagulation   S/P AVR (aortic valve replacement) and aortoplasty   AKI (acute kidney injury) ([place])   Streptococcal bacteremia   Sepsis secondary to streptococcal bacteremia -Patient was admitted with fever, tachycardia, acute kidney injury -Source of bacteremia is not entirely clear at this point -CT of chest, abdomen, pelvis did not show any obvious source of infection.  Urinalysis also unremarkable -He has not had any recent skin infections that he is aware of -Started on ceftriaxone -He had 2 positive sets of blood cultures (both aerobic and anaerobic) positive for gram-positive cocci.  BCID shows Streptococcus species -We will repeat blood cultures in a.m. -Ordered echocardiogram -We will request infectious disease input  Acute kidney injury -Secondary to sepsis -Renal function has improved with hydration  Paroxysmal atrial fibrillation -Anticoagulated with Xarelto -Chronically on metoprolol which was held on admission due to hypotension -Since blood pressures are improving, will resume metoprolol at reduced dose  Aortic aneurysm status post Bentall procedure -He is status post aortoplasty and AVR with bioprosthetic valve -Last saw CVTS approximately 1 month ago  Recent COVID infection -Was diagnosed  12/19 and completed a course of molnupiravir -Overall respiratory symptoms have improved -He is outside the window of isolation.  Would not place on airborne precautions  Hypothyroidism -Continue thyroid replacement  Anxiety -Continue As needed benzodiazepines  Obstructive sleep apnea -Continue on CPAP  BPH -Continue Flomax  History of asthmatic bronchitis -Continue inhaled steroids and bronchodilators  Thrombocytopenia -Related to sepsis -Continue to monitor  DVT prophylaxis:  rivaroxaban (XARELTO) tablet 20 mg  Code Status: DNR Family Communication: Updated wife at the bedside Disposition Plan: Status is: Inpatient  Remains inpatient appropriate because: Continued work-up for bacteremia         Consultants:    Procedures:    Antimicrobials:  Ceftriaxone 1/8 >   Subjective: Patient says he feels better.  No significant cough.  Denies any recent skin infections/boils.  Objective: Vitals:   10/27/21 0149 10/27/21 0549 10/27/21 0952 10/27/21 1933  BP: 110/70 116/75 119/77 140/84  Pulse: 94 69 84 86  Resp: 20 20 20 20   Temp: 98.2 F (36.8 C) (!) 97.5 F (36.4 C) (!) 97.5 F (36.4 C) 98.3 F (36.8 C)  TempSrc: Oral Oral Oral Oral  SpO2: 95% 96% 98% 98%  Weight:      Height:        Intake/Output Summary (Last 24 hours) at 10/27/2021 2152 Last data filed at 10/27/2021 1807 Gross per 24 hour  Intake 2709.43 ml  Output 900 ml  Net 1809.43 ml   Filed Weights   10/26/21 0407  Weight: 108.9 kg    Examination:  General exam: Appears calm and comfortable  Respiratory system: Clear to auscultation. Respiratory effort normal. Cardiovascular system: S1 & S2 heard, RRR. No JVD, murmurs, rubs, gallops or clicks. No pedal edema. Gastrointestinal system:  Abdomen is nondistended, soft and nontender. No organomegaly or masses felt. Normal bowel sounds heard. Central nervous system: Alert and oriented. No focal neurological deficits. Extremities: Symmetric  5 x 5 power. Skin: No rashes, lesions or ulcers Psychiatry: Judgement and insight appear normal. Mood & affect appropriate.     Data Reviewed: I have personally reviewed following labs and imaging studies  CBC: Recent Labs  Lab 10/26/21 0440 10/27/21 0400  WBC 3.5* 10.3  NEUTROABS 3.2  --   HGB 15.4 11.9*  HCT 45.0 34.6*  MCV 94.1 95.1  PLT 96* 91*   Basic Metabolic Panel: Recent Labs  Lab 10/26/21 0440 10/27/21 0400  NA 131* 136  K 3.7 3.3*  CL 96* 105  CO2 24 26  GLUCOSE 107* 187*  BUN 32* 21  CREATININE 1.61* 0.88  CALCIUM 8.6* 8.3*   GFR: Estimated Creatinine Clearance: 99.3 mL/min (by C-G formula based on SCr of 0.88 mg/dL). Liver Function Tests: Recent Labs  Lab 10/26/21 0440 10/27/21 0400  AST 27 28  ALT 23 21  ALKPHOS 54 35*  BILITOT 1.2 0.5  PROT 7.0 5.3*  ALBUMIN 3.7 2.6*   No results for input(s): LIPASE, AMYLASE in the last 168 hours. No results for input(s): AMMONIA in the last 168 hours. Coagulation Profile: Recent Labs  Lab 10/26/21 0440 10/27/21 0400  INR 2.5* 1.9*   Cardiac Enzymes: No results for input(s): CKTOTAL, CKMB, CKMBINDEX, TROPONINI in the last 168 hours. BNP (last 3 results) No results for input(s): PROBNP in the last 8760 hours. HbA1C: No results for input(s): HGBA1C in the last 72 hours. CBG: No results for input(s): GLUCAP in the last 168 hours. Lipid Profile: No results for input(s): CHOL, HDL, LDLCALC, TRIG, CHOLHDL, LDLDIRECT in the last 72 hours. Thyroid Function Tests: No results for input(s): TSH, T4TOTAL, FREET4, T3FREE, THYROIDAB in the last 72 hours. Anemia Panel: No results for input(s): VITAMINB12, FOLATE, FERRITIN, TIBC, IRON, RETICCTPCT in the last 72 hours. Sepsis Labs: Recent Labs  Lab 10/26/21 0439 10/26/21 0638 10/27/21 0400  PROCALCITON  --   --  54.74  LATICACIDVEN 2.6* 1.8  --     Recent Results (from the past 240 hour(s))  Resp Panel by RT-PCR (Flu A&B, Covid) Nasopharyngeal Swab      Status: Abnormal   Collection Time: 10/26/21  4:39 AM   Specimen: Nasopharyngeal Swab; Nasopharyngeal(NP) swabs in vial transport medium  Result Value Ref Range Status   SARS Coronavirus 2 by RT PCR POSITIVE (A) NEGATIVE Final    Comment: (NOTE) SARS-CoV-2 target nucleic acids are DETECTED.  The SARS-CoV-2 RNA is generally detectable in upper respiratory specimens during the acute phase of infection. Positive results are indicative of the presence of the identified virus, but do not rule out bacterial infection or co-infection with other pathogens not detected by the test. Clinical correlation with patient history and other diagnostic information is necessary to determine patient infection status. The expected result is Negative.  Fact Sheet for Patients: EntrepreneurPulse.com.au  Fact Sheet for Healthcare Providers: IncredibleEmployment.be  This test is not yet approved or cleared by the [place] FDA and  has been authorized for detection and/or diagnosis of SARS-CoV-2 by FDA under an Emergency Use Authorization (EUA).  This EUA will remain in effect (meaning this test can be used) for the duration of  the COVID-19 declaration under Section 564(b)(1) of the A ct, 21 U.S.C. section 360bbb-3(b)(1), unless the authorization is terminated or revoked sooner.     Influenza A by PCR  NEGATIVE NEGATIVE Final   Influenza B by PCR NEGATIVE NEGATIVE Final    Comment: (NOTE) The Xpert Xpress SARS-CoV-2/FLU/RSV plus assay is intended as an aid in the diagnosis of influenza from Nasopharyngeal swab specimens and should not be used as a sole basis for treatment. Nasal washings and aspirates are unacceptable for Xpert Xpress SARS-CoV-2/FLU/RSV testing.  Fact Sheet for Patients: EntrepreneurPulse.com.au  Fact Sheet for Healthcare Providers: IncredibleEmployment.be  This test is not yet approved or cleared by the  [place] FDA and has been authorized for detection and/or diagnosis of SARS-CoV-2 by FDA under an Emergency Use Authorization (EUA). This EUA will remain in effect (meaning this test can be used) for the duration of the COVID-19 declaration under Section 564(b)(1) of the Act, 21 U.S.C. section 360bbb-3(b)(1), unless the authorization is terminated or revoked.  Performed at [facility], [place] [address] [place], [place] [address]   Blood Culture (routine x 2)     Status: None (Preliminary result)   Collection Time: 10/26/21  4:39 AM   Specimen: BLOOD RIGHT FOREARM  Result Value Ref Range Status   Specimen Description   Final    BLOOD RIGHT FOREARM Performed at [facility], [place] [address] [place], [place] [address]    Special Requests   Final    BOTTLES DRAWN AEROBIC AND ANAEROBIC Blood Culture results may not be optimal due to an excessive volume of blood received in culture bottles Performed at [place] [address] [place], [place] [address]    Culture  Setup Time   Final    GRAM POSITIVE COCCI IN BOTH AEROBIC AND ANAEROBIC BOTTLES CRITICAL VALUE NOTED.  VALUE IS CONSISTENT WITH PREVIOUSLY REPORTED AND CALLED VALUE.    Culture   Final    GRAM POSITIVE COCCI TOO YOUNG TO READ Performed at [facility], [place] [address], [place], [place] [address]    Report Status PENDING  Incomplete  Blood Culture (routine x 2)     Status: None (Preliminary result)   Collection Time: 10/26/21  4:40 AM   Specimen: BLOOD  Result Value Ref Range Status   Specimen Description   Final    BLOOD LEFT ANTECUBITAL Performed at [place] [address] [place], [place] [address]    Special Requests   Final    BOTTLES DRAWN AEROBIC AND ANAEROBIC Blood Culture results may not be optimal due to an excessive volume of blood received in culture bottles Performed at [place] [address]  [place], [place] [address]    Culture  Setup Time   Final    GRAM POSITIVE COCCI IN BOTH AEROBIC AND ANAEROBIC BOTTLES CRITICAL RESULT CALLED TO, READ BACK BY AND VERIFIED WITH: L POINDEXTER,[email or web address]  10/26/21 Day    Culture   Final    GRAM POSITIVE COCCI TOO YOUNG TO READ Performed at [facility], [place] [address] [place], [place] [address]    Report Status PENDING  Incomplete  Urine Culture     Status: Abnormal   Collection Time: 10/26/21  4:40 AM   Specimen: Urine, Clean Catch  Result Value Ref Range Status   Specimen Description   Final    URINE, CLEAN CATCH Performed at [facility], [place] [address] [place], [place] [address]    Special Requests   Final    NONE Performed at [facility], [place] [address], [place], [place] [address]    Culture (  A)  Final    <10,000 COLONIES/mL INSIGNIFICANT GROWTH Performed at [place] [address] [place], [place] [address]    Report Status 10/27/2021 FINAL  Final  Blood Culture ID Panel (Reflexed)     Status: Abnormal   Collection Time: 10/26/21  4:40 AM  Result Value Ref Range Status   Enterococcus faecalis NOT DETECTED NOT DETECTED Final   Enterococcus Faecium NOT DETECTED NOT DETECTED Final   Listeria monocytogenes NOT DETECTED NOT DETECTED Final   Staphylococcus species NOT DETECTED NOT DETECTED Final   Staphylococcus aureus (BCID) NOT DETECTED NOT DETECTED Final   Staphylococcus epidermidis NOT DETECTED NOT DETECTED Final   Staphylococcus lugdunensis NOT DETECTED NOT DETECTED Final   Streptococcus species DETECTED (A) NOT DETECTED Final    Comment: Not Enterococcus species, Streptococcus agalactiae, Streptococcus pyogenes, or Streptococcus pneumoniae. CRITICAL RESULT CALLED TO, READ BACK BY AND VERIFIED WITH: L POINDEXTER,[email or web address]  10/26/21 [place]    Streptococcus agalactiae NOT DETECTED NOT DETECTED Final   Streptococcus pneumoniae NOT DETECTED NOT DETECTED Final    Streptococcus pyogenes NOT DETECTED NOT DETECTED Final   A.calcoaceticus-baumannii NOT DETECTED NOT DETECTED Final   Bacteroides fragilis NOT DETECTED NOT DETECTED Final   Enterobacterales NOT DETECTED NOT DETECTED Final   Enterobacter cloacae complex NOT DETECTED NOT DETECTED Final   Escherichia coli NOT DETECTED NOT DETECTED Final   Klebsiella aerogenes NOT DETECTED NOT DETECTED Final   Klebsiella oxytoca NOT DETECTED NOT DETECTED Final   Klebsiella pneumoniae NOT DETECTED NOT DETECTED Final   Proteus species NOT DETECTED NOT DETECTED Final   Salmonella species NOT DETECTED NOT DETECTED Final   Serratia marcescens NOT DETECTED NOT DETECTED Final   Haemophilus influenzae NOT DETECTED NOT DETECTED Final   Neisseria meningitidis NOT DETECTED NOT DETECTED Final   Pseudomonas aeruginosa NOT DETECTED NOT DETECTED Final   Stenotrophomonas maltophilia NOT DETECTED NOT DETECTED Final   Candida albicans NOT DETECTED NOT DETECTED Final   Candida auris NOT DETECTED NOT DETECTED Final   Candida glabrata NOT DETECTED NOT DETECTED Final   Candida krusei NOT DETECTED NOT DETECTED Final   Candida parapsilosis NOT DETECTED NOT DETECTED Final   Candida tropicalis NOT DETECTED NOT DETECTED Final   Cryptococcus neoformans/gattii NOT DETECTED NOT DETECTED Final    Comment: Performed at [facility], [address] [place],  [address]  Respiratory (~20 pathogens) panel by PCR     Status: None   Collection Time: 10/26/21 11:27 AM   Specimen: Nasopharyngeal Swab; Respiratory  Result Value Ref Range Status   Adenovirus NOT DETECTED NOT DETECTED Final   Coronavirus 229E NOT DETECTED NOT DETECTED Final    Comment: (NOTE) The Coronavirus on the Respiratory Panel, DOES NOT test for the novel  Coronavirus (2019 nCoV)    Coronavirus HKU1 NOT DETECTED NOT DETECTED Final   Coronavirus NL63 NOT DETECTED NOT DETECTED Final   Coronavirus OC43 NOT DETECTED NOT DETECTED Final   Metapneumovirus NOT  DETECTED NOT DETECTED Final   Rhinovirus / Enterovirus NOT DETECTED NOT DETECTED Final   Influenza A NOT DETECTED NOT DETECTED Final   Influenza B NOT DETECTED NOT DETECTED Final   Parainfluenza Virus 1 NOT DETECTED NOT DETECTED Final   Parainfluenza Virus 2 NOT DETECTED NOT DETECTED Final   Parainfluenza Virus 3 NOT DETECTED NOT DETECTED Final   Parainfluenza Virus 4 NOT DETECTED NOT DETECTED Final   Respiratory Syncytial Virus NOT DETECTED NOT DETECTED Final   Bordetella pertussis NOT DETECTED NOT DETECTED Final   Bordetella Parapertussis  NOT DETECTED NOT DETECTED Final   Chlamydophila pneumoniae NOT DETECTED NOT DETECTED Final   Mycoplasma pneumoniae NOT DETECTED NOT DETECTED Final    Comment: Performed at [facility], [place] [address] [place], [place] [address]         Radiology Studies: CT ABDOMEN PELVIS WO CONTRAST  Result Date: 10/26/2021 CLINICAL DATA:  71 year old male with sepsis. Positive COVID-19. EXAM: CT CHEST, ABDOMEN AND PELVIS WITHOUT CONTRAST TECHNIQUE: Multidetector CT imaging of the chest, abdomen and pelvis was performed following the standard protocol without IV contrast. COMPARISON:  CTA Chest, Abdomen, and Pelvis 10/10/2020. Chest radiographs 0447 hours today and earlier. FINDINGS: CT CHEST FINDINGS Cardiovascular: Prior CABG. Chronic aortic valve replacement. Normal caliber thoracic aorta, stable mildly tortuous descending aorta. No cardiomegaly or pericardial effusion. Vascular patency is not evaluated in the absence of IV contrast. Mediastinum/Nodes: Negative aside from post CABG postoperative changes. Lungs/Pleura: Mild respiratory motion. Stable lung volumes from 2021. Major airways are patent. Left lung base mild atelectasis or scarring not significantly changed from 2021. No consolidation. No pleural effusion. No convincing acute lung inflammation. Musculoskeletal: Chronic ununited sternotomy. Advanced chronic degenerative endplate spurring in the thoracic  spine resulting in some levels of thoracic ankylosis. No acute osseous abnormality identified. CT ABDOMEN PELVIS FINDINGS Hepatobiliary: Chronically absent gallbladder. Negative noncontrast liver. Pancreas: Negative. Spleen: Negative. Adrenals/Urinary Tract: Normal adrenal glands. Nonobstructed kidneys. Symmetric pararenal inflammatory stranding is nonspecific and not significantly changed from 2021. No nephrolithiasis. No hydroureter. Diminutive bladder. Incidental pelvic phleboliths. Stomach/Bowel: Negative rectum. Previous partial distal colectomy is new from 2021. There is a sigmoid anastomosis at the pelvic inlet with no adverse features (series 2, image 102). Upstream residual fairly extensive diverticulosis, but no convincing active inflammation. Retained stool in redundant transverse colon. Mild diverticulosis at the hepatic flexure. Negative right colon. Decompressed terminal ileum. Diminutive or absent appendix. No dilated small bowel. Fluid filled but nondilated stomach and duodenum. No free air. No free fluid. No convincing mesenteric stranding. Vascular/Lymphatic: Normal caliber abdominal aorta. Aortoiliac calcified atherosclerosis. Vascular patency is not evaluated in the absence of IV contrast. No lymphadenopathy identified. Reproductive: Chronic prostatomegaly appears stable. Other: No pelvic free fluid. 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 No acute osseous abnormality identified. IMPRESSION: 1. No acute or inflammatory process identified in the non-contrast chest, abdomen, or pelvis. 2. Left lung base scarring. Previous CABG, aortic valve replacement, and partial sigmoid colectomy. Diverticulosis of the residual colon with no convincing active inflammation. 3. Aortic Atherosclerosis (ICD10-I70.0). Electronically Signed   By: Genevie Ann M.D.   On: 10/26/2021 07:30   CT Chest Wo Contrast  Result Date: 10/26/2021 CLINICAL DATA:  71 year old male with sepsis.  Positive COVID-19. EXAM: CT CHEST, ABDOMEN AND PELVIS WITHOUT CONTRAST TECHNIQUE: Multidetector CT imaging of the chest, abdomen and pelvis was performed following the standard protocol without IV contrast. COMPARISON:  CTA Chest, Abdomen, and Pelvis 10/10/2020. Chest radiographs 0447 hours today and earlier. FINDINGS: CT CHEST FINDINGS Cardiovascular: Prior CABG. Chronic aortic valve replacement. Normal caliber thoracic aorta, stable mildly tortuous descending aorta. No cardiomegaly or pericardial effusion. Vascular patency is not evaluated in the absence of IV contrast. Mediastinum/Nodes: Negative aside from post CABG postoperative changes. Lungs/Pleura: Mild respiratory motion. Stable lung volumes from 2021. Major airways are patent. Left lung base mild atelectasis or scarring not significantly changed from 2021. No consolidation. No pleural effusion. No convincing acute lung inflammation. Musculoskeletal: Chronic ununited sternotomy. Advanced chronic degenerative endplate spurring in the thoracic spine resulting in some levels of  thoracic ankylosis. No acute osseous abnormality identified. CT ABDOMEN PELVIS FINDINGS Hepatobiliary: Chronically absent gallbladder. Negative noncontrast liver. Pancreas: Negative. Spleen: Negative. Adrenals/Urinary Tract: Normal adrenal glands. Nonobstructed kidneys. Symmetric pararenal inflammatory stranding is nonspecific and not significantly changed from 2021. No nephrolithiasis. No hydroureter. Diminutive bladder. Incidental pelvic phleboliths. Stomach/Bowel: Negative rectum. Previous partial distal colectomy is new from 2021. There is a sigmoid anastomosis at the pelvic inlet with no adverse features (series 2, image 102). Upstream residual fairly extensive diverticulosis, but no convincing active inflammation. Retained stool in redundant transverse colon. Mild diverticulosis at the hepatic flexure. Negative right colon. Decompressed terminal ileum. Diminutive or absent  appendix. No dilated small bowel. Fluid filled but nondilated stomach and duodenum. No free air. No free fluid. No convincing mesenteric stranding. Vascular/Lymphatic: Normal caliber abdominal aorta. Aortoiliac calcified atherosclerosis. Vascular patency is not evaluated in the absence of IV contrast. No lymphadenopathy identified. Reproductive: Chronic prostatomegaly appears stable. Other: No pelvic free fluid. 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 No acute osseous abnormality identified. IMPRESSION: 1. No acute or inflammatory process identified in the non-contrast chest, abdomen, or pelvis. 2. Left lung base scarring. Previous CABG, aortic valve replacement, and partial sigmoid colectomy. Diverticulosis of the residual colon with no convincing active inflammation. 3. Aortic Atherosclerosis (ICD10-I70.0). Electronically Signed   By: Genevie Ann M.D.   On: 10/26/2021 07:30   DG Chest Port 1 View  Result Date: 10/26/2021 CLINICAL DATA:  Possible sepsis, nonproductive cough, tachycardia. EXAM: PORTABLE CHEST 1 VIEW COMPARISON:  08/20/2021. FINDINGS: The heart size and mediastinal contours are within normal limits. Both lungs are clear. No acute osseous abnormality. Sternotomy wires are present over the midline. Surgical clips are noted in the axillary region on the right. IMPRESSION: No acute cardiopulmonary process. Electronically Signed   By: Brett Fairy M.D.   On: 10/26/2021 04:55        Scheduled Meds:  finasteride  5 mg Oral QHS   guaiFENesin  600 mg Oral BID   levothyroxine  112 mcg Oral QAC breakfast   liothyronine  25 mcg Oral QAC breakfast   metoprolol tartrate  12.5 mg Oral BID   mometasone-formoterol  2 puff Inhalation BID   multivitamin with minerals  1 tablet Oral Daily   pantoprazole  80 mg Oral Daily   psyllium  1 packet Oral Daily   rivaroxaban  20 mg Oral Q supper   tamsulosin  0.4 mg Oral BID   venlafaxine XR  150 mg Oral Q breakfast    zolpidem  5 mg Oral QHS   Continuous Infusions:  0.9 % NaCl with KCl 40 mEq / L 75 mL/hr at 10/27/21 1807   cefTRIAXone (ROCEPHIN)  IV 2 g (10/27/21 1504)     LOS: 1 day    Time spent: 59mins    Kathie Dike, MD [facility]   If 7PM-7AM, please contact night-coverage [email or web address]  10/27/2021, 9:52 PM

## 2021-10-27 NOTE — Plan of Care (Signed)
  Problem: Education: Goal: Knowledge of General Education information will improve Description Including pain rating scale, medication(s)/side effects and non-pharmacologic comfort measures Outcome: Progressing   Problem: Health Behavior/Discharge Planning: Goal: Ability to manage health-related needs will improve Outcome: Progressing   

## 2021-10-28 ENCOUNTER — Inpatient Hospital Stay (HOSPITAL_COMMUNITY): Payer: Medicare Other

## 2021-10-28 DIAGNOSIS — N179 Acute kidney failure, unspecified: Secondary | ICD-10-CM | POA: Diagnosis not present

## 2021-10-28 DIAGNOSIS — A419 Sepsis, unspecified organism: Secondary | ICD-10-CM | POA: Diagnosis not present

## 2021-10-28 DIAGNOSIS — G4733 Obstructive sleep apnea (adult) (pediatric): Secondary | ICD-10-CM | POA: Diagnosis not present

## 2021-10-28 DIAGNOSIS — R7881 Bacteremia: Secondary | ICD-10-CM

## 2021-10-28 DIAGNOSIS — Z952 Presence of prosthetic heart valve: Secondary | ICD-10-CM

## 2021-10-28 DIAGNOSIS — U071 COVID-19: Secondary | ICD-10-CM

## 2021-10-28 DIAGNOSIS — B955 Unspecified streptococcus as the cause of diseases classified elsewhere: Secondary | ICD-10-CM

## 2021-10-28 DIAGNOSIS — R6520 Severe sepsis without septic shock: Secondary | ICD-10-CM

## 2021-10-28 LAB — ECHOCARDIOGRAM COMPLETE
AR max vel: 1.96 cm2
AV Area VTI: 2.08 cm2
AV Area mean vel: 1.82 cm2
AV Mean grad: 18 mmHg
AV Peak grad: 31.7 mmHg
Ao pk vel: 2.82 m/s
Area-P 1/2: 3.68 cm2
Calc EF: 51.8 %
Height: 70 in
S' Lateral: 3.1 cm
Single Plane A2C EF: 51.7 %
Single Plane A4C EF: 55.5 %
Weight: 3840 oz

## 2021-10-28 LAB — CBC
HCT: 36 % — ABNORMAL LOW (ref 39.0–52.0)
Hemoglobin: 12.1 g/dL — ABNORMAL LOW (ref 13.0–17.0)
MCH: 32.2 pg (ref 26.0–34.0)
MCHC: 33.6 g/dL (ref 30.0–36.0)
MCV: 95.7 fL (ref 80.0–100.0)
Platelets: 117 10*3/uL — ABNORMAL LOW (ref 150–400)
RBC: 3.76 MIL/uL — ABNORMAL LOW (ref 4.22–5.81)
RDW: 13.2 % (ref 11.5–15.5)
WBC: 9.6 10*3/uL (ref 4.0–10.5)
nRBC: 0 % (ref 0.0–0.2)

## 2021-10-28 LAB — BASIC METABOLIC PANEL
Anion gap: 2 — ABNORMAL LOW (ref 5–15)
BUN: 13 mg/dL (ref 8–23)
CO2: 25 mmol/L (ref 22–32)
Calcium: 8 mg/dL — ABNORMAL LOW (ref 8.9–10.3)
Chloride: 109 mmol/L (ref 98–111)
Creatinine, Ser: 0.8 mg/dL (ref 0.61–1.24)
GFR, Estimated: >60 mL/min (ref >60)
Glucose, Bld: 128 mg/dL — ABNORMAL HIGH (ref 70–99)
Potassium: 3.2 mmol/L — ABNORMAL LOW (ref 3.5–5.1)
Sodium: 136 mmol/L (ref 135–145)

## 2021-10-28 LAB — PATHOLOGIST SMEAR REVIEW

## 2021-10-28 LAB — MAGNESIUM: Magnesium: 2 mg/dL (ref 1.7–2.4)

## 2021-10-28 MED ORDER — MOMETASONE FURO-FORMOTEROL FUM 200-5 MCG/ACT IN AERO
2.0000 | INHALATION_SPRAY | Freq: Two times a day (BID) | RESPIRATORY_TRACT | Status: DC
  Administered 2021-10-29 – 2021-11-05 (×12): 2 via RESPIRATORY_TRACT

## 2021-10-28 MED ORDER — POTASSIUM CHLORIDE CRYS ER 20 MEQ PO TBCR
40.0000 meq | EXTENDED_RELEASE_TABLET | Freq: Once | ORAL | Status: AC
Start: 2021-10-28 — End: 2021-10-28
  Administered 2021-10-28: 40 meq via ORAL
  Filled 2021-10-28: qty 2

## 2021-10-28 NOTE — TOC Progression Note (Signed)
Transition of Care [facility]) - Progression Note    Patient Details  Name: Grant [name] MRN: [id number] Date of Birth: 11/17/1950  [facility] of [facility]) CM/SW Contact  Purcell Mouton, RN Phone Number: 10/28/2021, 2:13 PM  Clinical Narrative:    Pt from home with spouse. Plan to get LTAC to review chart.    Expected Discharge Plan: Home/Self Care Barriers to Discharge: No Barriers Identified  Expected Discharge Plan and Services Expected Discharge Plan: Home/Self Care       Living arrangements for the past 2 months: Single Family Home                                       Social Determinants of Health (SDOH) Interventions    Readmission Risk Interventions No flowsheet data found.

## 2021-10-28 NOTE — Progress Notes (Signed)
PROGRESS NOTE    Grant [name]  DXI:[id number] DOB: 03-Mar-1951 DOA: 10/26/2021 PCP: Shirline Frees, MD    Brief Narrative:  71 year old male with a history of atrial fibrillation on Xarelto, recent COVID infection, hypothyroidism, presented with worsening fatigue, decreased p.o. intake, fevers and shaking chills that began approximately 3 to 4 days prior to admission.  He was found to have streptococcal bacteremia.  Source of bacteremia is not entirely clear.  He has been started on IV antibiotics and admitted for further work-up.   Assessment & Plan:   Principal Problem:   Streptococcal bacteremia Active Problems:   Obstructive sleep apnea   PAF (paroxysmal atrial fibrillation) (HCC)   Chronic anticoagulation   S/P AVR (aortic valve replacement) and aortoplasty   Sepsis (HCC)   BPH (benign prostatic hyperplasia)   AKI (acute kidney injury) ([place])   COVID-19   Sepsis secondary to streptococcal bacteremia -Patient was admitted with fever, tachycardia, acute kidney injury -Source of bacteremia is not entirely clear at this point -CT of chest, abdomen, pelvis did not show any obvious source of infection.  Urinalysis also unremarkable -He has not had any recent skin infections that he is aware of -Currently on ceftriaxone -He had 2 positive sets of blood cultures (both aerobic and anaerobic) positive for gram-positive cocci.  BCID shows Streptococcus species -Repeat blood cultures drawn on 1/9 -CT maxillofacial without signs of infection/abscess -MRI brain pending -2D echo without signs of vegetations -cardiology requested for TEE -appreciate ID assistance  Acute kidney injury -Secondary to sepsis -Renal function has improved with hydration  Paroxysmal atrial fibrillation -Anticoagulated with Xarelto -Chronically on metoprolol which was held on admission due to hypotension -Since blood pressures are improving, he was restarted on metoprolol at reduced dose  Aortic  aneurysm status post Bentall procedure -He is status post aortoplasty and AVR with bioprosthetic valve -Last saw CVTS approximately 1 month ago  Recent COVID infection -Was diagnosed 12/19 and completed a course of molnupiravir -Overall respiratory symptoms have improved -He is outside the window of isolation.  Would not place on airborne precautions  Hypothyroidism -Continue thyroid replacement  Anxiety -Continue As needed benzodiazepines  Obstructive sleep apnea -Continue on CPAP  BPH -Continue Flomax  History of asthmatic bronchitis -Continue inhaled steroids and bronchodilators  Thrombocytopenia -Related to sepsis -Improving -Continue to monitor  DVT prophylaxis:  rivaroxaban (XARELTO) tablet 20 mg  Code Status: DNR Family Communication: Updated wife at the bedside Disposition Plan: Status is: Inpatient  Remains inpatient appropriate because: Continued work-up for bacteremia         Consultants:  Infectious disease  Procedures:    Antimicrobials:  Ceftriaxone 1/8 >   Subjective: Slept well last night. No new complaints  Objective: Vitals:   10/27/21 2250 10/28/21 0037 10/28/21 0550 10/28/21 1124  BP: 126/79  114/82 134/88  Pulse: 86  78 78  Resp:  18 16 17   Temp:   98 F (36.7 C) 98.1 F (36.7 C)  TempSrc:   Oral Oral  SpO2:   99% 100%  Weight:      Height:        Intake/Output Summary (Last 24 hours) at 10/28/2021 1935 Last data filed at 10/28/2021 1829 Gross per 24 hour  Intake 666.25 ml  Output 2625 ml  Net -1958.75 ml   Filed Weights   10/26/21 0407  Weight: 108.9 kg    Examination:  General exam: Appears calm and comfortable  Respiratory system: Clear to auscultation. Respiratory effort normal. Cardiovascular system: S1 &  S2 heard, RRR. No JVD, murmurs, rubs, gallops or clicks. No pedal edema. Gastrointestinal system: Abdomen is nondistended, soft and nontender. No organomegaly or masses felt. Normal bowel sounds  heard. Central nervous system: Alert and oriented. No focal neurological deficits. Extremities: Symmetric 5 x 5 power. Skin: No rashes, lesions or ulcers Psychiatry: Judgement and insight appear normal. Mood & affect appropriate.     Data Reviewed: I have personally reviewed following labs and imaging studies  CBC: Recent Labs  Lab 10/26/21 0440 10/27/21 0400 10/28/21 0402  WBC 3.5* 10.3 9.6  NEUTROABS 3.2  --   --   HGB 15.4 11.9* 12.1*  HCT 45.0 34.6* 36.0*  MCV 94.1 95.1 95.7  PLT 96* 91* 914*   Basic Metabolic Panel: Recent Labs  Lab 10/26/21 0440 10/27/21 0400 10/28/21 0402  NA 131* 136 136  K 3.7 3.3* 3.2*  CL 96* 105 109  CO2 24 26 25   GLUCOSE 107* 187* 128*  BUN 32* 21 13  CREATININE 1.61* 0.88 0.80  CALCIUM 8.6* 8.3* 8.0*  MG  --   --  2.0   GFR: Estimated Creatinine Clearance: 109.3 mL/min (by C-G formula based on SCr of 0.8 mg/dL). Liver Function Tests: Recent Labs  Lab 10/26/21 0440 10/27/21 0400  AST 27 28  ALT 23 21  ALKPHOS 54 35*  BILITOT 1.2 0.5  PROT 7.0 5.3*  ALBUMIN 3.7 2.6*   No results for input(s): LIPASE, AMYLASE in the last 168 hours. No results for input(s): AMMONIA in the last 168 hours. Coagulation Profile: Recent Labs  Lab 10/26/21 0440 10/27/21 0400  INR 2.5* 1.9*   Cardiac Enzymes: No results for input(s): CKTOTAL, CKMB, CKMBINDEX, TROPONINI in the last 168 hours. BNP (last 3 results) No results for input(s): PROBNP in the last 8760 hours. HbA1C: No results for input(s): HGBA1C in the last 72 hours. CBG: No results for input(s): GLUCAP in the last 168 hours. Lipid Profile: No results for input(s): CHOL, HDL, LDLCALC, TRIG, CHOLHDL, LDLDIRECT in the last 72 hours. Thyroid Function Tests: No results for input(s): TSH, T4TOTAL, FREET4, T3FREE, THYROIDAB in the last 72 hours. Anemia Panel: No results for input(s): VITAMINB12, FOLATE, FERRITIN, TIBC, IRON, RETICCTPCT in the last 72 hours. Sepsis Labs: Recent Labs   Lab 10/26/21 0439 10/26/21 0638 10/27/21 0400  PROCALCITON  --   --  54.74  LATICACIDVEN 2.6* 1.8  --     Recent Results (from the past 240 hour(s))  Resp Panel by RT-PCR (Flu A&B, Covid) Nasopharyngeal Swab     Status: Abnormal   Collection Time: 10/26/21  4:39 AM   Specimen: Nasopharyngeal Swab; Nasopharyngeal(NP) swabs in vial transport medium  Result Value Ref Range Status   SARS Coronavirus 2 by RT PCR POSITIVE (A) NEGATIVE Final    Comment: (NOTE) SARS-CoV-2 target nucleic acids are DETECTED.  The SARS-CoV-2 RNA is generally detectable in upper respiratory specimens during the acute phase of infection. Positive results are indicative of the presence of the identified virus, but do not rule out bacterial infection or co-infection with other pathogens not detected by the test. Clinical correlation with patient history and other diagnostic information is necessary to determine patient infection status. The expected result is Negative.  Fact Sheet for Patients: EntrepreneurPulse.com.au  Fact Sheet for Healthcare Providers: IncredibleEmployment.be  This test is not yet approved or cleared by the [place] FDA and  has been authorized for detection and/or diagnosis of SARS-CoV-2 by FDA under an Emergency Use Authorization (EUA).  This EUA will  remain in effect (meaning this test can be used) for the duration of  the COVID-19 declaration under Section 564(b)(1) of the A ct, 21 U.S.C. section 360bbb-3(b)(1), unless the authorization is terminated or revoked sooner.     Influenza A by PCR NEGATIVE NEGATIVE Final   Influenza B by PCR NEGATIVE NEGATIVE Final    Comment: (NOTE) The Xpert Xpress SARS-CoV-2/FLU/RSV plus assay is intended as an aid in the diagnosis of influenza from Nasopharyngeal swab specimens and should not be used as a sole basis for treatment. Nasal washings and aspirates are unacceptable for Xpert Xpress  SARS-CoV-2/FLU/RSV testing.  Fact Sheet for Patients: EntrepreneurPulse.com.au  Fact Sheet for Healthcare Providers: IncredibleEmployment.be  This test is not yet approved or cleared by the [place] FDA and has been authorized for detection and/or diagnosis of SARS-CoV-2 by FDA under an Emergency Use Authorization (EUA). This EUA will remain in effect (meaning this test can be used) for the duration of the COVID-19 declaration under Section 564(b)(1) of the Act, 21 U.S.C. section 360bbb-3(b)(1), unless the authorization is terminated or revoked.  Performed at [facility], [place] [address] [place], [place] [address]   Blood Culture (routine x 2)     Status: Abnormal (Preliminary result)   Collection Time: 10/26/21  4:39 AM   Specimen: BLOOD RIGHT FOREARM  Result Value Ref Range Status   Specimen Description   Final    BLOOD RIGHT FOREARM Performed at [facility], [place] [address] [place], [place] [address]    Special Requests   Final    BOTTLES DRAWN AEROBIC AND ANAEROBIC Blood Culture results may not be optimal due to an excessive volume of blood received in culture bottles Performed at [place] [address] [place], [place] [address]    Culture  Setup Time   Final    GRAM POSITIVE COCCI IN BOTH AEROBIC AND ANAEROBIC BOTTLES CRITICAL VALUE NOTED.  VALUE IS CONSISTENT WITH PREVIOUSLY REPORTED AND CALLED VALUE.    Culture (A)  Final    STREPTOCOCCUS MITIS/ORALIS CULTURE REINCUBATED FOR BETTER GROWTH Performed at [facility], [place] [address], [place], [place] [address]    Report Status PENDING  Incomplete  Blood Culture (routine x 2)     Status: Abnormal (Preliminary result)   Collection Time: 10/26/21  4:40 AM   Specimen: BLOOD  Result Value Ref Range Status   Specimen Description   Final    BLOOD LEFT ANTECUBITAL Performed at [place]  [address], [place], [place] [address]    Special Requests   Final    BOTTLES DRAWN AEROBIC AND ANAEROBIC Blood Culture results may not be optimal due to an excessive volume of blood received in culture bottles Performed at [place] [address] [place], [place] [address]    Culture  Setup Time   Final    GRAM POSITIVE COCCI IN BOTH AEROBIC AND ANAEROBIC BOTTLES CRITICAL RESULT CALLED TO, READ BACK BY AND VERIFIED WITH: L POINDEXTER,[email or web address]  10/26/21 [place]    Culture (A)  Final    STREPTOCOCCUS MITIS/ORALIS SUSCEPTIBILITIES TO FOLLOW Performed at [facility], [address] [place], [place] [address]    Report Status PENDING  Incomplete  Urine Culture     Status: Abnormal   Collection Time: 10/26/21  4:40 AM   Specimen: Urine, Clean Catch  Result Value Ref Range Status   Specimen Description   Final    URINE, CLEAN CATCH  Performed at [facility], [place] [address], [place], [place] [address]    Special Requests   Final    NONE Performed at [facility], [place] [address] [place], [place] [address]    Culture (A)  Final    <10,000 COLONIES/mL INSIGNIFICANT GROWTH Performed at [place] [address], [place], [place] [address]    Report Status 10/27/2021 FINAL  Final  Blood Culture ID Panel (Reflexed)     Status: Abnormal   Collection Time: 10/26/21  4:40 AM  Result Value Ref Range Status   Enterococcus faecalis NOT DETECTED NOT DETECTED Final   Enterococcus Faecium NOT DETECTED NOT DETECTED Final   Listeria monocytogenes NOT DETECTED NOT DETECTED Final   Staphylococcus species NOT DETECTED NOT DETECTED Final   Staphylococcus aureus (BCID) NOT DETECTED NOT DETECTED Final   Staphylococcus epidermidis NOT DETECTED NOT DETECTED Final   Staphylococcus lugdunensis NOT DETECTED NOT DETECTED Final   Streptococcus species DETECTED (A) NOT DETECTED Final    Comment: Not Enterococcus species, Streptococcus  agalactiae, Streptococcus pyogenes, or Streptococcus pneumoniae. CRITICAL RESULT CALLED TO, READ BACK BY AND VERIFIED WITH: L POINDEXTER,[email or web address]  10/26/21 [place]    Streptococcus agalactiae NOT DETECTED NOT DETECTED Final   Streptococcus pneumoniae NOT DETECTED NOT DETECTED Final   Streptococcus pyogenes NOT DETECTED NOT DETECTED Final   A.calcoaceticus-baumannii NOT DETECTED NOT DETECTED Final   Bacteroides fragilis NOT DETECTED NOT DETECTED Final   Enterobacterales NOT DETECTED NOT DETECTED Final   Enterobacter cloacae complex NOT DETECTED NOT DETECTED Final   Escherichia coli NOT DETECTED NOT DETECTED Final   Klebsiella aerogenes NOT DETECTED NOT DETECTED Final   Klebsiella oxytoca NOT DETECTED NOT DETECTED Final   Klebsiella pneumoniae NOT DETECTED NOT DETECTED Final   Proteus species NOT DETECTED NOT DETECTED Final   Salmonella species NOT DETECTED NOT DETECTED Final   Serratia marcescens NOT DETECTED NOT DETECTED Final   Haemophilus influenzae NOT DETECTED NOT DETECTED Final   Neisseria meningitidis NOT DETECTED NOT DETECTED Final   Pseudomonas aeruginosa NOT DETECTED NOT DETECTED Final   Stenotrophomonas maltophilia NOT DETECTED NOT DETECTED Final   Candida albicans NOT DETECTED NOT DETECTED Final   Candida auris NOT DETECTED NOT DETECTED Final   Candida glabrata NOT DETECTED NOT DETECTED Final   Candida krusei NOT DETECTED NOT DETECTED Final   Candida parapsilosis NOT DETECTED NOT DETECTED Final   Candida tropicalis NOT DETECTED NOT DETECTED Final   Cryptococcus neoformans/gattii NOT DETECTED NOT DETECTED Final    Comment: Performed at [facility], [address] [place], [place] [address]  Respiratory (~20 pathogens) panel by PCR     Status: None   Collection Time: 10/26/21 11:27 AM   Specimen: Nasopharyngeal Swab; Respiratory  Result Value Ref Range Status   Adenovirus NOT DETECTED NOT DETECTED Final   Coronavirus 229E NOT DETECTED NOT DETECTED Final    Comment:  (NOTE) The Coronavirus on the Respiratory Panel, DOES NOT test for the novel  Coronavirus (2019 nCoV)    Coronavirus HKU1 NOT DETECTED NOT DETECTED Final   Coronavirus NL63 NOT DETECTED NOT DETECTED Final   Coronavirus OC43 NOT DETECTED NOT DETECTED Final   Metapneumovirus NOT DETECTED NOT DETECTED Final   Rhinovirus / Enterovirus NOT DETECTED NOT DETECTED Final   Influenza A NOT DETECTED NOT DETECTED Final   Influenza B NOT DETECTED NOT DETECTED Final   Parainfluenza Virus 1 NOT DETECTED NOT DETECTED Final   Parainfluenza Virus 2 NOT DETECTED NOT DETECTED Final  Parainfluenza Virus 3 NOT DETECTED NOT DETECTED Final   Parainfluenza Virus 4 NOT DETECTED NOT DETECTED Final   Respiratory Syncytial Virus NOT DETECTED NOT DETECTED Final   Bordetella pertussis NOT DETECTED NOT DETECTED Final   Bordetella Parapertussis NOT DETECTED NOT DETECTED Final   Chlamydophila pneumoniae NOT DETECTED NOT DETECTED Final   Mycoplasma pneumoniae NOT DETECTED NOT DETECTED Final    Comment: Performed at [facility], [place] [address], [place], [place] [address]         Radiology Studies: ECHOCARDIOGRAM COMPLETE  Result Date: 10/28/2021    ECHOCARDIOGRAM REPORT   Patient Name:   [facility] Date of Exam: 10/28/2021 Medical Rec #:  [id number]        Height:       70.0 in Accession #:    [id number]       Weight:       240.0 lb Date of Birth:  11/24/1950        BSA:          2.255 m Patient Age:    61 years         BP:           114/82 mmHg Patient Gender: M                HR:           91 bpm. Exam Location:  Inpatient Procedure: 2D Echo, Cardiac Doppler and Color Doppler Indications:     Bacteremia  History:         Patient has prior history of Echocardiogram examinations, most                  recent 05/01/2021. Arrythmias:Atrial Fibrillation.                  Aortic Valve: 25 mm Edwards valve is present in the aortic                  position. Procedure Date: 07/02/17.  Sonographer:     Bernadene Person  RDCS Referring Phys:  [place] Diagnosing Phys: Oswaldo Milian MD IMPRESSIONS  1. Left ventricular ejection fraction, by estimation, is 50 to 55%. The left ventricle has low normal function. The left ventricle has no regional wall motion abnormalities. There is mild left ventricular hypertrophy. Left ventricular diastolic parameters are consistent with Grade II diastolic dysfunction (pseudonormalization). Elevated left atrial pressure.  2. Right ventricular systolic function is mildly reduced. The right ventricular size is normal. There is moderately elevated pulmonary artery systolic pressure. The estimated right ventricular systolic pressure is 63.8 mmHg.  3. Right atrial size was mildly dilated.  4. The mitral valve is normal in structure. No evidence of mitral valve regurgitation.  5. There is a 25 mm Edwards bioprosthetic valve present in the aortic position     Aortic valve regurgitation is not visualized. MG 14mmHg, stable from prior echo 05/01/21  6. The inferior vena cava is dilated in size with >50% respiratory variability, suggesting right atrial pressure of 8 mmHg. Conclusion(s)/Recommendation(s): No clear vegetation seen. Given bioprosthetic aortic valve, recommend TEE for further evaluation. FINDINGS  Left Ventricle: Left ventricular ejection fraction, by estimation, is 50 to 55%. The left ventricle has low normal function. The left ventricle has no regional wall motion abnormalities. The left ventricular internal cavity size was normal in size. There is mild left ventricular hypertrophy. Left ventricular diastolic parameters are consistent with Grade II diastolic dysfunction (pseudonormalization). Elevated left  atrial pressure. Right Ventricle: The right ventricular size is normal. Right vetricular wall thickness was not well visualized. Right ventricular systolic function is mildly reduced. There is moderately elevated pulmonary artery systolic pressure. The tricuspid regurgitant  velocity is 3.09 m/s, and with an assumed right atrial pressure of 8 mmHg, the estimated right ventricular systolic pressure is 35.0 mmHg. Left Atrium: Left atrial size was normal in size. Right Atrium: Right atrial size was mildly dilated. Pericardium: Trivial pericardial effusion is present. Presence of epicardial fat layer. Mitral Valve: The mitral valve is normal in structure. No evidence of mitral valve regurgitation. Tricuspid Valve: The tricuspid valve is normal in structure. Tricuspid valve regurgitation is trivial. Aortic Valve: The aortic valve has been repaired/replaced. Aortic valve regurgitation is not visualized. Aortic valve mean gradient measures 18.0 mmHg. Aortic valve peak gradient measures 31.7 mmHg. Aortic valve area, by VTI measures 2.08 cm. There is a  25 mm Edwards valve present in the aortic position. Procedure Date: 07/02/17. Pulmonic Valve: The pulmonic valve was not well visualized. Pulmonic valve regurgitation is not visualized. Aorta: The aortic root and ascending aorta are structurally normal, with no evidence of dilitation. Venous: The inferior vena cava is dilated in size with greater than 50% respiratory variability, suggesting right atrial pressure of 8 mmHg. IAS/Shunts: No atrial level shunt detected by color flow Doppler.  LEFT VENTRICLE PLAX 2D LVIDd:         5.20 cm      Diastology LVIDs:         3.10 cm      LV e' medial:    6.15 cm/s LV PW:         1.20 cm      LV E/e' medial:  16.9 LV IVS:        1.30 cm      LV e' lateral:   11.10 cm/s LVOT diam:     2.10 cm      LV E/e' lateral: 9.4 LV SV:         113 LV SV Index:   50 LVOT Area:     3.46 cm  LV Volumes (MOD) LV vol d, MOD A2C: 143.0 ml LV vol d, MOD A4C: 205.0 ml LV vol s, MOD A2C: 69.0 ml LV vol s, MOD A4C: 91.3 ml LV SV MOD A2C:     74.0 ml LV SV MOD A4C:     205.0 ml LV SV MOD BP:      94.8 ml RIGHT VENTRICLE RV S prime:     6.90 cm/s TAPSE (M-mode): 1.4 cm LEFT ATRIUM           Index        RIGHT ATRIUM            Index LA diam:      4.60 cm 2.04 cm/m   RA Area:     23.80 cm LA Vol (A2C): 70.3 ml 31.17 ml/m  RA Volume:   67.70 ml  30.02 ml/m LA Vol (A4C): 84.9 ml 37.64 ml/m  AORTIC VALVE AV Area (Vmax):    1.96 cm AV Area (Vmean):   1.82 cm AV Area (VTI):     2.08 cm AV Vmax:           281.50 cm/s AV Vmean:          197.750 cm/s AV VTI:            0.546 m AV Peak Grad:      31.7 mmHg  AV Mean Grad:      18.0 mmHg LVOT Vmax:         159.00 cm/s LVOT Vmean:        104.000 cm/s LVOT VTI:          0.327 m LVOT/AV VTI ratio: 0.60  AORTA Ao Root diam: 3.60 cm Ao Asc diam:  3.50 cm MITRAL VALVE                TRICUSPID VALVE MV Area (PHT): 3.68 cm     TR Peak grad:   38.2 mmHg MV Decel Time: 206 msec     TR Vmax:        309.00 cm/s MV E velocity: 104.00 cm/s MV A velocity: 68.30 cm/s   SHUNTS MV E/A ratio:  1.52         Systemic VTI:  0.33 m                             Systemic Diam: 2.10 cm Oswaldo Milian MD Electronically signed by Oswaldo Milian MD Signature Date/Time: 10/28/2021/6:15:23 PM    Final (Updated)    CT MAXILLOFACIAL WO CONTRAST  Result Date: 10/28/2021 CLINICAL DATA:  Concern for maxillary/facial abscess; no facial pain complaints. Unclear source of bacteremia, recent dental procedures EXAM: CT MAXILLOFACIAL WITHOUT CONTRAST TECHNIQUE: Multidetector CT imaging of the maxillofacial structures was performed. Multiplanar CT image reconstructions were also generated. COMPARISON:  CT sinuses 09/18/2020 and CT head 02/16/2019. FINDINGS: Osseous: No fracture or mandibular dislocation. No destructive process. No significant periapical lucency about the maxillary or mandibular teeth. Orbits: Negative. No traumatic or inflammatory finding. Sinuses: Small mucous retention cysts in the sphenoid sinuses. Otherwise clear. Soft tissues: Negative. Evaluation for infection is somewhat limited in the absence of intravenous contrast. Within this limitation, no low-density collection to suggest abscess or phlegmon.  Limited intracranial: No significant or unexpected finding. IMPRESSION: No acute fracture or destructive osseous lesion. No noncontrast evidence of abscess or phlegmon. Electronically Signed   By: Merilyn Baba M.D.   On: 10/28/2021 12:33        Scheduled Meds:  finasteride  5 mg Oral QHS   guaiFENesin  600 mg Oral BID   levothyroxine  112 mcg Oral QAC breakfast   liothyronine  25 mcg Oral QAC breakfast   metoprolol tartrate  12.5 mg Oral BID   mometasone-formoterol  2 puff Inhalation BID   multivitamin with minerals  1 tablet Oral Daily   pantoprazole  80 mg Oral Daily   psyllium  1 packet Oral Daily   rivaroxaban  20 mg Oral Q supper   tamsulosin  0.4 mg Oral BID   venlafaxine XR  150 mg Oral Q breakfast   zolpidem  5 mg Oral QHS   Continuous Infusions:  0.9 % NaCl with KCl 40 mEq / L 75 mL/hr at 10/27/21 1807   cefTRIAXone (ROCEPHIN)  IV 2 g (10/28/21 1530)     LOS: 2 days    Time spent: 61mins    Kathie Dike, MD [facility]   If 7PM-7AM, please contact night-coverage [email or web address]  10/28/2021, 7:35 PM

## 2021-10-28 NOTE — Consult Note (Signed)
[facility]    Date of Admission:  10/26/2021     Reason for Consult:Strep bacteremia     Referring Physician: Dr Roderic Palau  Current antibiotics: Ceftriaxone 1/8-present  Previous antibiotics: Cefepime 1/7-1/8 Metronidazole 1/7/1/8 Vancomycin 1/7-1/8   ASSESSMENT:    71 y.o. male admitted with:  Strep species bacteremia: Unclear source of bacteremia, however, recent dental procedures would make this concerning for potential source.  CT imaging of the chest, abdomen, pelvis was unrevealing.  TTE is pending as are repeat blood cultures which were obtained today.  Overall, presentation is concerning for subacute prosthetic valve endocarditis with the possibility of CNS septic emboli given his acute on chronic worsening of gait instability. Status post Bentall procedure and aortic valve replacement in 7026: Complicated by chronic fibrous malunion of the sternum previously noted on CT imaging. Severe sepsis: Secondary to #1 Acute kidney injury: Presented with creatinine of 1.61.  This is improved today to 0.80 which is consistent with his baseline. Recent COVID infection: In December 2022 s/p course of antiviral medication   RECOMMENDATIONS:    Continue ceftriaxone 2 g IV daily Follow-up susceptibilities and repeat blood cultures TTE is pending.  He will require TEE as well given his history of aortic valve replacement Will hold off on adding gentamicin pending work up for endocarditis given his AKI on admission CT maxillofacial given recent dental procedures and bacteremia MRI brain given worsening of his balance issues and concern for left-sided endocarditis Will follow   Principal Problem:   Streptococcal bacteremia Active Problems:   Obstructive sleep apnea   PAF (paroxysmal atrial fibrillation) (HCC)   Chronic anticoagulation   S/P AVR (aortic valve replacement) and aortoplasty   Sepsis (HCC)   BPH (benign prostatic hyperplasia)   AKI (acute  kidney injury) ([place])   MEDICATIONS:    Scheduled Meds:  finasteride  5 mg Oral QHS   guaiFENesin  600 mg Oral BID   levothyroxine  112 mcg Oral QAC breakfast   liothyronine  25 mcg Oral QAC breakfast   metoprolol tartrate  12.5 mg Oral BID   mometasone-formoterol  2 puff Inhalation BID   multivitamin with minerals  1 tablet Oral Daily   pantoprazole  80 mg Oral Daily   psyllium  1 packet Oral Daily   rivaroxaban  20 mg Oral Q supper   tamsulosin  0.4 mg Oral BID   venlafaxine XR  150 mg Oral Q breakfast   zolpidem  5 mg Oral QHS   Continuous Infusions:  0.9 % NaCl with KCl 40 mEq / L 75 mL/hr at 10/27/21 1807   cefTRIAXone (ROCEPHIN)  IV 2 g (10/27/21 1504)   PRN Meds:.acetaminophen **OR** acetaminophen, albuterol, ALPRAZolam, azelastine, benzonatate, diphenhydrAMINE, ondansetron **OR** ondansetron (ZOFRAN) IV, sodium chloride  HPI:    RHILEY SOLEM is a 70 y.o. 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  In the emergency department, he underwent CT chest/abdomen/pelvis that was unremarkable.  He was found to be hypotensive with lactic acidosis and he was started on broad-spectrum antibiotics.  He was also noted to be  febrile with a T-max of 103.1 F.  Blood cultures were obtained which are positive for Strep spp in 4 of 4 bottles.  Other lab work revealed leukopenia with a WBC of 3.5, thrombocytopenia, creatinine 1.6, normal  LFTs, and T bili 1.2.  A transthoracic echocardiogram has been ordered and is currently pending.  Patient reports he has not been feeling well for the past several weeks which was attributed to an episode of bronchitis and then COVID-19 infection.  During this interval he was treated with a course of amoxicillin as well as a course of doxycycline.  He has also undergone recent dental procedures for which he took amoxicillin prophylaxis in the setting of his aortic valve replacement.  He is waiting for final repair from his dentist to be done however this has been delayed due to his hospitalization.  He currently has no tooth or jaw pain.  Patient was also recently admitted in August 2022 for 2 days when he was noted to have sigmoid stricture of the colon due to recurrent diverticulitis with abscess.  He underwent robotic descending and sigmoid colectomy with lysis of adhesions and drainage of abscess.  There were no cultures obtained at the time of his admission for this issue, however, as noted CT abdomen and pelvis was unremarkable for issues in this area.   Past Medical History:  Diagnosis Date   Allergic rhinitis    Anxiety    Arthritis    Asthma    Bladder tumor    Chronic fatigue    COVID-19 05/22/2021   Depression    06/30/17 Pt denies being depressed, reports Effexor is taken for Chronic Fatigue    Dyspnea    Enlarged prostate    Fibromyalgia    GERD (gastroesophageal reflux disease)    Headache    History of chronic bronchitis    History of migraine    History of toxic encephalopathy    Hypothyroidism    OSA on CPAP    CPAP 14   PAF (paroxysmal atrial fibrillation) ([place]) CARDIOLOGIST -- DR [name]   DX OCT 2013   S/P AVR (aortic valve replacement) and aortoplasty    Sensitiveness to light    Unspecified essential hypertension    Urethral tumor    PROSTATIC    Social History   Tobacco Use   Smoking status: Former    Packs/day: 0.50    Years:  27.00    Pack years: 13.50    Types: Cigarettes    Start date: 39    Quit date: 10/21/1979    Years since quitting: 42.0   Smokeless tobacco: Never  Vaping Use   Vaping Use: Never used  Substance Use Topics   Alcohol use: Yes    Alcohol/week: 4.0 standard drinks    Types: 4 Cans of beer per week    Comment: drinks "as much as I could" - 2-3 beers up to 8-10   Drug use: Never    Family History  Problem Relation Age of Onset   Aortic aneurysm Mother 80       cause of death   Other Father        motor vehicle accident   Heart disease Other        family history    Allergies  Allergen Reactions   Perphenazine Swelling    Tongue swelling   Amiodarone Swelling   Codeine Itching   Tramadol Other (See Comments)    dellusion     Review of Systems  Constitutional:  Positive for chills, fever and malaise/fatigue.  Eyes: Negative.   Respiratory:  Positive for cough. Negative for sputum production.   Cardiovascular:  Negative for chest pain and leg swelling.  Genitourinary:  Positive for frequency.  Neurological:        + Balance instability.   All other systems reviewed and are negative.  OBJECTIVE:   Blood pressure 114/82, pulse 78, temperature 98 F (36.7 C), temperature source Oral, resp. rate 16, height 5\' 10"  (1.778 m), weight 108.9 kg, SpO2 99 %. Body mass index is 34.44 kg/m.  Physical Exam Constitutional:      General: He is not in acute distress.    Appearance: Normal appearance. He is obese.  HENT:     Head: Normocephalic and atraumatic.     Mouth/Throat:     Mouth: Mucous membranes are moist.     Pharynx: Oropharynx is clear.  Eyes:     Extraocular Movements: Extraocular movements intact.     Conjunctiva/sclera: Conjunctivae normal.  Cardiovascular:     Rate and Rhythm: Normal rate and regular rhythm.  Pulmonary:     Effort: Pulmonary effort is normal. No respiratory distress.     Comments: Breath sounds diminished at bases.   Abdominal:     General: There is no distension.     Palpations: Abdomen is soft.     Tenderness: There is no abdominal tenderness. There is no guarding or rebound.  Musculoskeletal:        General: Normal range of motion.     Cervical back: Normal range of motion and neck supple.     Right lower leg: No edema.     Left lower leg: No edema.  Skin:    General: Skin is warm and dry.  Neurological:     General: No focal deficit present.     Mental Status: [name], place, and time.  Psychiatric:        Mood and Affect: Mood normal.        Behavior: Behavior normal.     Lab Results: Lab Results  Component Value Date   WBC 9.6 10/28/2021   HGB 12.1 (L) 10/28/2021   HCT 36.0 (L) 10/28/2021   MCV 95.7 10/28/2021   PLT 117 (L) 10/28/2021    Lab Results  Component Value Date   NA 136 10/28/2021   K 3.2 (L) 10/28/2021   CO2 25 10/28/2021   GLUCOSE 128 (H) 10/28/2021   BUN 13 10/28/2021   CREATININE 0.80 10/28/2021   CALCIUM 8.0 (L) 10/28/2021   GFRNONAA >60 10/28/2021   GFRAA >60 02/16/2019    Lab Results  Component Value Date   ALT 21 10/27/2021   AST 28 10/27/2021   ALKPHOS 35 (L) 10/27/2021   BILITOT 0.5 10/27/2021       Component Value Date/Time   CRP 5.6 (H) 10/13/2020 0550       Component Value Date/Time   ESRSEDRATE 6 03/08/2018 0020    I have reviewed the micro and lab results in Epic.  Imaging: No results found.   Imaging independently reviewed in Epic.  Raynelle High[facility] for Infectious Disease [facility] [phone] pager 10/28/2021, 10:55 AM

## 2021-10-28 NOTE — Progress Notes (Signed)
°  Echocardiogram 2D Echocardiogram has been performed.  Fidel Levy 10/28/2021, 2:26 PM

## 2021-10-28 NOTE — Plan of Care (Signed)
  Problem: Education: Goal: Knowledge of General Education information will improve Description: Including pain rating scale, medication(s)/side effects and non-pharmacologic comfort measures Outcome: Progressing   Problem: Clinical Measurements: Goal: Diagnostic test results will improve Outcome: Progressing Goal: Respiratory complications will improve Outcome: Progressing Goal: Cardiovascular complication will be avoided Outcome: Progressing   Problem: Activity: Goal: Risk for activity intolerance will decrease Outcome: Progressing   

## 2021-10-29 DIAGNOSIS — D696 Thrombocytopenia, unspecified: Secondary | ICD-10-CM

## 2021-10-29 DIAGNOSIS — R7881 Bacteremia: Secondary | ICD-10-CM | POA: Diagnosis not present

## 2021-10-29 DIAGNOSIS — Z952 Presence of prosthetic heart valve: Secondary | ICD-10-CM | POA: Diagnosis not present

## 2021-10-29 DIAGNOSIS — A419 Sepsis, unspecified organism: Secondary | ICD-10-CM | POA: Diagnosis not present

## 2021-10-29 DIAGNOSIS — N179 Acute kidney failure, unspecified: Secondary | ICD-10-CM | POA: Diagnosis not present

## 2021-10-29 DIAGNOSIS — N401 Enlarged prostate with lower urinary tract symptoms: Secondary | ICD-10-CM

## 2021-10-29 LAB — CULTURE, BLOOD (ROUTINE X 2)

## 2021-10-29 MED ORDER — PENICILLIN G POTASSIUM 20000000 UNITS IJ SOLR
12.0000 10*6.[IU] | Freq: Two times a day (BID) | INTRAVENOUS | Status: DC
  Administered 2021-10-30 – 2021-11-05 (×13): 12 10*6.[IU] via INTRAVENOUS
  Filled 2021-10-29 (×17): qty 12

## 2021-10-29 MED ORDER — METOPROLOL TARTRATE 25 MG PO TABS: 12.5000 mg | ORAL_TABLET | Freq: Two times a day (BID) | ORAL | Status: DC

## 2021-10-29 MED ORDER — GUAIFENESIN ER 600 MG PO TB12: 600.0000 mg | ORAL_TABLET | Freq: Two times a day (BID) | ORAL | Status: DC

## 2021-10-29 MED ORDER — PANTOPRAZOLE SODIUM 40 MG PO TBEC: 80.0000 mg | DELAYED_RELEASE_TABLET | Freq: Every day | ORAL | Status: DC

## 2021-10-29 MED ORDER — RIVAROXABAN 20 MG PO TABS
20.0000 mg | ORAL_TABLET | Freq: Every day | ORAL | Status: DC
  Administered 2021-10-29 – 2021-11-04 (×6): 20 mg via ORAL
  Filled 2021-10-29 (×7): qty 1

## 2021-10-29 MED ORDER — TAMSULOSIN HCL 0.4 MG PO CAPS
0.4000 mg | ORAL_CAPSULE | Freq: Two times a day (BID) | ORAL | Status: DC
  Administered 2021-10-29 – 2021-11-05 (×13): 0.4 mg via ORAL
  Filled 2021-10-29 (×14): qty 1

## 2021-10-29 MED ORDER — FINASTERIDE 5 MG PO TABS
5.0000 mg | ORAL_TABLET | Freq: Every day | ORAL | Status: DC
  Administered 2021-10-29 – 2021-11-04 (×6): 5 mg via ORAL
  Filled 2021-10-29 (×7): qty 1

## 2021-10-29 MED ORDER — PSYLLIUM 95 % PO PACK
1.0000 | PACK | Freq: Every day | ORAL | Status: DC
  Administered 2021-10-30 – 2021-11-05 (×6): 1 via ORAL
  Filled 2021-10-29 (×7): qty 1

## 2021-10-29 MED ORDER — TAMSULOSIN HCL 0.4 MG PO CAPS: 0.4000 mg | ORAL_CAPSULE | Freq: Two times a day (BID) | ORAL | Status: DC

## 2021-10-29 MED ORDER — GUAIFENESIN ER 600 MG PO TB12
600.0000 mg | ORAL_TABLET | Freq: Two times a day (BID) | ORAL | Status: DC
  Administered 2021-10-29 – 2021-11-05 (×13): 600 mg via ORAL
  Filled 2021-10-29 (×14): qty 1

## 2021-10-29 MED ORDER — PANTOPRAZOLE SODIUM 40 MG PO TBEC
40.0000 mg | DELAYED_RELEASE_TABLET | Freq: Two times a day (BID) | ORAL | Status: DC
  Administered 2021-10-29 – 2021-11-05 (×13): 40 mg via ORAL
  Filled 2021-10-29 (×14): qty 1

## 2021-10-29 MED ORDER — RIZATRIPTAN BENZOATE 10 MG PO TABS
10.0000 mg | ORAL_TABLET | Freq: Once | ORAL | Status: DC | PRN
  Filled 2021-10-29: qty 1

## 2021-10-29 MED ORDER — METOPROLOL TARTRATE 12.5 MG HALF TABLET
12.5000 mg | ORAL_TABLET | Freq: Two times a day (BID) | ORAL | Status: DC
  Administered 2021-10-29 – 2021-11-05 (×14): 12.5 mg via ORAL
  Filled 2021-10-29 (×14): qty 1

## 2021-10-29 MED ORDER — RIZATRIPTAN BENZOATE 10 MG PO TABS
10.0000 mg | ORAL_TABLET | ORAL | Status: DC | PRN
  Administered 2021-11-01: 10 mg via ORAL

## 2021-10-29 MED ORDER — ALPRAZOLAM 0.25 MG PO TABS
0.2500 mg | ORAL_TABLET | ORAL | Status: AC
  Administered 2021-10-29: 0.25 mg via ORAL
  Filled 2021-10-29: qty 1

## 2021-10-29 NOTE — Progress Notes (Signed)
[facility]  Date of Admission:  10/26/2021           Reason for visit: Follow up on Strep bacteremia  Current antibiotics: Ceftriaxone 1/8-present   Previous antibiotics: Cefepime 1/7-1/8 Metronidazole 1/7/1/8 Vancomycin 1/7-1/8    ASSESSMENT:    71 y.o. male admitted with:  Streptococcus mitis/oralis bacteremia: Suspected likely odontogenic source of infection given relatively recent dental procedures.  CT maxillofacial was fortunately negative for destructive osseous lesions and no noncontrast evidence of abscess or phlegmon was seen.  [name], abdomen, pelvis that was unrevealing and MRI brain that did not reveal any evidence of CNS septic emboli.  TTE was negative for vegetation and TEE is pending in the setting of prosthetic valve. Status post Bentall procedure and aortic valve replacement in 1610: Complicated by chronic fibrous malunion of the sternum previously noted on CT scan. Sepsis: Secondary to # 1.  Improved. Acute kidney injury: Patient presented with creatinine of 1.61.  This has improved and creatinine clearance is 109.3. Recent COVID infection: In December 2022 and status post a course of antivirals.  RECOMMENDATIONS:    Continue antibiotics.  Will narrow from ceftriaxone to penicillin Follow-up repeat blood cultures Await TEE Will follow   Principal Problem:   Streptococcal bacteremia Active Problems:   Obstructive sleep apnea   PAF (paroxysmal atrial fibrillation) (HCC)   Chronic anticoagulation   S/P AVR (aortic valve replacement) and aortoplasty   Sepsis (HCC)   BPH (benign prostatic hyperplasia)   AKI (acute kidney injury) ([place])   COVID-19    MEDICATIONS:    Scheduled Meds:  finasteride  5 mg Oral QHS   guaiFENesin  600 mg Oral BID   levothyroxine  112 mcg Oral QAC breakfast   liothyronine  25 mcg Oral QAC breakfast   metoprolol tartrate  12.5 mg Oral BID    mometasone-formoterol  2 puff Inhalation BID   multivitamin with minerals  1 tablet Oral Daily   pantoprazole  80 mg Oral Daily   psyllium  1 packet Oral Daily   rivaroxaban  20 mg Oral Q supper   tamsulosin  0.4 mg Oral BID   venlafaxine XR  150 mg Oral Q breakfast   zolpidem  5 mg Oral QHS   Continuous Infusions:  cefTRIAXone (ROCEPHIN)  IV 2 g (10/29/21 1311)   PRN Meds:.acetaminophen **OR** acetaminophen, albuterol, ALPRAZolam, azelastine, benzonatate, diphenhydrAMINE, ondansetron **OR** ondansetron (ZOFRAN) IV, sodium chloride  SUBJECTIVE:   24 hour events:  No acute events noted TTE negative CT maxillofacial negative MRI brain negative Repeat cx NGTD Afebrile  No other new complaints.  Reports that he thinks he is getting stronger and feeling better.  [name] due to medication administrations.  Review of Systems  All other systems reviewed and are negative.    OBJECTIVE:   Blood pressure 129/86, pulse 73, temperature 97.7 F (36.5 C), temperature source Oral, resp. rate 20, height 5\' 10"  (1.778 m), weight 108.9 kg, SpO2 97 %. Body mass index is 34.44 kg/m.  Physical Exam Constitutional:      General: He is not in acute distress.    Appearance: Normal appearance.  HENT:     Head: Normocephalic and atraumatic.  Eyes:     Extraocular Movements: Extraocular movements intact.     Conjunctiva/sclera: Conjunctivae normal.  Abdominal:     General: There is no distension.     Palpations:  Abdomen is soft.     Tenderness: There is no abdominal tenderness.  Musculoskeletal:        General: Normal range of motion.     Cervical back: Normal range of motion and neck supple.  Skin:    General: Skin is warm and dry.     Findings: No rash.  Neurological:     General: No focal deficit present.     Mental Status: [name], place, and time.  Psychiatric:        Mood and Affect: Mood normal.         Behavior: Behavior normal.     Lab Results: Lab Results  Component Value Date   WBC 9.6 10/28/2021   HGB 12.1 (L) 10/28/2021   HCT 36.0 (L) 10/28/2021   MCV 95.7 10/28/2021   PLT 117 (L) 10/28/2021    Lab Results  Component Value Date   NA 136 10/28/2021   K 3.2 (L) 10/28/2021   CO2 25 10/28/2021   GLUCOSE 128 (H) 10/28/2021   BUN 13 10/28/2021   CREATININE 0.80 10/28/2021   CALCIUM 8.0 (L) 10/28/2021   GFRNONAA >60 10/28/2021   GFRAA >60 02/16/2019    Lab Results  Component Value Date   ALT 21 10/27/2021   AST 28 10/27/2021   ALKPHOS 35 (L) 10/27/2021   BILITOT 0.5 10/27/2021       Component Value Date/Time   CRP 5.6 (H) 10/13/2020 0550       Component Value Date/Time   ESRSEDRATE 6 03/08/2018 0020     I have reviewed the micro and lab results in Epic.  Imaging: MR BRAIN WO CONTRAST  Result Date: 10/29/2021 CLINICAL DATA:  Initial evaluation for meningitis, CNS infection. EXAM: MRI HEAD WITHOUT CONTRAST TECHNIQUE: Multiplanar, multiecho pulse sequences of the brain and surrounding structures were obtained without intravenous contrast. COMPARISON:  MRI from 12/25/2020. FINDINGS: Brain: Susceptibility artifact emanates from the right suboccipital region, mildly limited exam. Cerebral volume within normal limits. Mild patchy and hazy T2/FLAIR hyperintensity involving the periventricular deep white matter both cerebral hemispheres, most consistent with chronic small vessel ischemic disease, mild in nature. Mild patchy involvement the pons. Small remote lacunar infarct present at the left caudate. No abnormal foci of restricted diffusion to suggest acute or subacute ischemia. Gray-white matter differentiation maintained. No encephalomalacia to suggest chronic cortical infarction. No acute intracranial hemorrhage. Chronic microhemorrhage at the mid left frontal region noted, stable. Few additional subtle punctate chronic micro hemorrhages noted as well. Findings likely  small vessel/hypertensive in nature. No mass lesion, midline shift, or mass effect. No hydrocephalus or extra-axial fluid collection. No signal changes to suggest encephalitis or cerebritis. No visible foci of septic emboli. No visible changes of meningitis on this noncontrast examination. No intraventricular debris. Pituitary gland suprasellar region normal. Midline structures intact. Vascular: Major intracranial vascular flow voids are maintained. Skull and upper cervical spine: Craniocervical junction within normal limits. Bone marrow signal intensity within normal limits. No scalp soft tissue abnormality. Sinuses/Orbits: Globes orbital soft tissues demonstrate no acute finding. Small retention cyst noted within the right sphenoid sinus. Mild mucosal thickening noted within the ethmoidal air cells. Paranasal sinuses are otherwise clear. Small bilateral mastoid effusions noted. Inner ear structures grossly normal. Other: None. IMPRESSION: 1. No acute intracranial abnormality. No MRI evidence for acute intracranial infection on this noncontrast examination. 2. Mild chronic microvascular ischemic disease 3. Small bilateral mastoid effusions, of uncertain significance. Correlation with physical exam recommended. Electronically Signed  By: Jeannine Boga M.D.   On: 10/29/2021 05:14   ECHOCARDIOGRAM COMPLETE  Result Date: 10/28/2021    ECHOCARDIOGRAM REPORT   Patient Name:   [name] Date of Exam: 10/28/2021 Medical Rec #:  [id number]        Height:       70.0 in Accession #:    [id number]       Weight:       240.0 lb Date of Birth:  [birth year]        BSA:          2.255 m Patient Age:    35 years         BP:           114/82 mmHg Patient Gender: M                HR:           91 bpm. Exam Location:  Inpatient Procedure: 2D Echo, Cardiac Doppler and Color Doppler Indications:     Bacteremia  History:         Patient has prior history of Echocardiogram examinations, most                  recent 05/01/2021.  Arrythmias:Atrial Fibrillation.                  Aortic Valve: 25 mm Edwards valve is present in the aortic                  position. Procedure Date: 07/02/17.  Sonographer:     Bernadene Person RDCS Referring Phys:  [place] Diagnosing Phys: Oswaldo Milian MD IMPRESSIONS  1. Left ventricular ejection fraction, by estimation, is 50 to 55%. The left ventricle has low normal function. The left ventricle has no regional wall motion abnormalities. There is mild left ventricular hypertrophy. Left ventricular diastolic parameters are consistent with Grade II diastolic dysfunction (pseudonormalization). Elevated left atrial pressure.  2. Right ventricular systolic function is mildly reduced. The right ventricular size is normal. There is moderately elevated pulmonary artery systolic pressure. The estimated right ventricular systolic pressure is 01.0 mmHg.  3. Right atrial size was mildly dilated.  4. The mitral valve is normal in structure. No evidence of mitral valve regurgitation.  5. There is a 25 mm Edwards bioprosthetic valve present in the aortic position     Aortic valve regurgitation is not visualized. MG 61mmHg, stable from prior echo 05/01/21  6. The inferior vena cava is dilated in size with >50% respiratory variability, suggesting right atrial pressure of 8 mmHg. Conclusion(s)/Recommendation(s): No clear vegetation seen. Given bioprosthetic aortic valve, recommend TEE for further evaluation. FINDINGS  Left Ventricle: Left ventricular ejection fraction, by estimation, is 50 to 55%. The left ventricle has low normal function. The left ventricle has no regional wall motion abnormalities. The left ventricular internal cavity size was normal in size. There is mild left ventricular hypertrophy. Left ventricular diastolic parameters are consistent with Grade II diastolic dysfunction (pseudonormalization). Elevated left atrial pressure. Right Ventricle: The right ventricular size is normal. Right  vetricular wall thickness was not well visualized. Right ventricular systolic function is mildly reduced. There is moderately elevated pulmonary artery systolic pressure. The tricuspid regurgitant velocity is 3.09 m/s, and with an assumed right atrial pressure of 8 mmHg, the estimated right ventricular systolic pressure is 93.2 mmHg. Left Atrium: Left atrial size was normal in size. Right Atrium: Right atrial size was mildly dilated. Pericardium: Trivial  pericardial effusion is present. Presence of epicardial fat layer. Mitral Valve: The mitral valve is normal in structure. No evidence of mitral valve regurgitation. Tricuspid Valve: The tricuspid valve is normal in structure. Tricuspid valve regurgitation is trivial. Aortic Valve: The aortic valve has been repaired/replaced. Aortic valve regurgitation is not visualized. Aortic valve mean gradient measures 18.0 mmHg. Aortic valve peak gradient measures 31.7 mmHg. Aortic valve area, by VTI measures 2.08 cm. There is a  25 mm Edwards valve present in the aortic position. Procedure Date: 07/02/17. Pulmonic Valve: The pulmonic valve was not well visualized. Pulmonic valve regurgitation is not visualized. Aorta: The aortic root and ascending aorta are structurally normal, with no evidence of dilitation. Venous: The inferior vena cava is dilated in size with greater than 50% respiratory variability, suggesting right atrial pressure of 8 mmHg. IAS/Shunts: No atrial level shunt detected by color flow Doppler.  LEFT VENTRICLE PLAX 2D LVIDd:         5.20 cm      Diastology LVIDs:         3.10 cm      LV e' medial:    6.15 cm/s LV PW:         1.20 cm      LV E/e' medial:  16.9 LV IVS:        1.30 cm      LV e' lateral:   11.10 cm/s LVOT diam:     2.10 cm      LV E/e' lateral: 9.4 LV SV:         113 LV SV Index:   50 LVOT Area:     3.46 cm  LV Volumes (MOD) LV vol d, MOD A2C: 143.0 ml LV vol d, MOD A4C: 205.0 ml LV vol s, MOD A2C: 69.0 ml LV vol s, MOD A4C: 91.3 ml LV SV MOD  A2C:     74.0 ml LV SV MOD A4C:     205.0 ml LV SV MOD BP:      94.8 ml RIGHT VENTRICLE RV S prime:     6.90 cm/s TAPSE (M-mode): 1.4 cm LEFT ATRIUM           Index        RIGHT ATRIUM           Index LA diam:      4.60 cm 2.04 cm/m   RA Area:     23.80 cm LA Vol (A2C): 70.3 ml 31.17 ml/m  RA Volume:   67.70 ml  30.02 ml/m LA Vol (A4C): 84.9 ml 37.64 ml/m  AORTIC VALVE AV Area (Vmax):    1.96 cm AV Area (Vmean):   1.82 cm AV Area (VTI):     2.08 cm AV Vmax:           281.50 cm/s AV Vmean:          197.750 cm/s AV VTI:            0.546 m AV Peak Grad:      31.7 mmHg AV Mean Grad:      18.0 mmHg LVOT Vmax:         159.00 cm/s LVOT Vmean:        104.000 cm/s LVOT VTI:          0.327 m LVOT/AV VTI ratio: 0.60  AORTA Ao Root diam: 3.60 cm Ao Asc diam:  3.50 cm MITRAL VALVE  TRICUSPID VALVE MV Area (PHT): 3.68 cm     TR Peak grad:   38.2 mmHg MV Decel Time: 206 msec     TR Vmax:        309.00 cm/s MV E velocity: 104.00 cm/s MV A velocity: 68.30 cm/s   SHUNTS MV E/A ratio:  1.52         Systemic VTI:  0.33 m                             Systemic Diam: 2.10 cm Oswaldo Milian MD Electronically signed by Oswaldo Milian MD Signature Date/Time: 10/28/2021/6:15:23 PM    Final (Updated)    CT MAXILLOFACIAL WO CONTRAST  Result Date: 10/28/2021 CLINICAL DATA:  Concern for maxillary/facial abscess; no facial pain complaints. Unclear source of bacteremia, recent dental procedures EXAM: CT MAXILLOFACIAL WITHOUT CONTRAST TECHNIQUE: Multidetector CT imaging of the maxillofacial structures was performed. Multiplanar CT image reconstructions were also generated. COMPARISON:  CT sinuses 09/18/2020 and CT head 02/16/2019. FINDINGS: Osseous: No fracture or mandibular dislocation. No destructive process. No significant periapical lucency about the maxillary or mandibular teeth. Orbits: Negative. No traumatic or inflammatory finding. Sinuses: Small mucous retention cysts in the sphenoid sinuses. Otherwise  clear. Soft tissues: Negative. Evaluation for infection is somewhat limited in the absence of intravenous contrast. Within this limitation, no low-density collection to suggest abscess or phlegmon. Limited intracranial: No significant or unexpected finding. IMPRESSION: No acute fracture or destructive osseous lesion. No noncontrast evidence of abscess or phlegmon. Electronically Signed   By: Merilyn Baba M.D.   On: 10/28/2021 12:33     Imaging independently reviewed in Epic.    Raynelle Highland for Infectious Disease [facility] [phone] pager 10/29/2021, 1:43 PM

## 2021-10-29 NOTE — Progress Notes (Signed)
PROGRESS NOTE    Grant [name]  WGY:[id number] DOB: 04/03/1951 DOA: 10/26/2021 PCP: Shirline Frees, MD    Brief Narrative:  71 year old male with a history of atrial fibrillation on Xarelto, recent COVID infection, hypothyroidism, presented with worsening fatigue, decreased p.o. intake, fevers and shaking chills that began approximately 3 to 4 days prior to admission.  He was found to have streptococcal bacteremia.  Source of bacteremia is not entirely clear.  He has been started on IV antibiotics and admitted for further work-up.   Assessment & Plan:   Principal Problem:   Streptococcal bacteremia Active Problems:   Obstructive sleep apnea   PAF (paroxysmal atrial fibrillation) (HCC)   Chronic anticoagulation   S/P AVR (aortic valve replacement) and aortoplasty   Sepsis (HCC)   BPH (benign prostatic hyperplasia)   AKI (acute kidney injury) ([place])   COVID-19   Sepsis secondary to streptococcal bacteremia -Patient was admitted with fever, tachycardia, acute kidney injury -Source of bacteremia is not entirely clear at this point -CT of chest, abdomen, pelvis did not show any obvious source of infection.  Urinalysis also unremarkable -He has not had any recent skin infections that he is aware of -Currently on ceftriaxone -He had 2 positive sets of blood cultures (both aerobic and anaerobic) positive for gram-positive cocci.  Further identification shows Streptococcus mitis/oralis -Repeat blood cultures drawn on 1/9, no growth as of yet -CT maxillofacial without signs of infection/abscess -MRI brain without any evidence of septic emboli -2D echo without signs of vegetations -cardiology requested for TEE, this will be performed on 1/13 -appreciate ID assistance -Antibiotics changed to penicillin G  Acute kidney injury -Secondary to sepsis -Renal function has improved with hydration  Paroxysmal atrial fibrillation -Anticoagulated with Xarelto -Chronically on metoprolol  which was held on admission due to hypotension -Since blood pressures are improving, he was restarted on metoprolol at reduced dose  Aortic aneurysm status post Bentall procedure -He is status post aortoplasty and AVR with bioprosthetic valve -Last saw CVTS approximately 1 month ago  Recent COVID infection -Was diagnosed 12/19 and completed a course of molnupiravir -Overall respiratory symptoms have improved -He is outside the window of isolation.  Would not place on airborne precautions  Hypothyroidism -Continue thyroid replacement  Anxiety -Continue As needed benzodiazepines  Obstructive sleep apnea -Continue on CPAP  BPH -Continue Flomax  History of asthmatic bronchitis -Continue inhaled steroids and bronchodilators  Thrombocytopenia -Related to sepsis -Improving -Continue to monitor  DVT prophylaxis:  rivaroxaban (XARELTO) tablet 20 mg  Code Status: DNR Family Communication: Updated wife at the bedside Disposition Plan: Status is: Inpatient  Remains inpatient appropriate because: Continued work-up for bacteremia         Consultants:  Infectious disease  Procedures:  2D echo  Antimicrobials:  Ceftriaxone 1/8 > 1/10 Penicillin G 1/11 >   Subjective: Did not sleep well overnight.  Reports frequent interruptions for medication dosages.  Objective: Vitals:   10/29/21 0551 10/29/21 1358 10/29/21 1939 10/29/21 1948  BP: 129/86 (!) 141/87  134/85  Pulse: 73 63  76  Resp: 20 20  17   Temp: 97.7 F (36.5 C) 98 F (36.7 C)  98.1 F (36.7 C)  TempSrc: Oral Oral  Oral  SpO2: 97% 98% 97% 100%  Weight:      Height:        Intake/Output Summary (Last 24 hours) at 10/29/2021 2251 Last data filed at 10/29/2021 1949 Gross per 24 hour  Intake 960 ml  Output 2800 ml  Net -1840  ml   Filed Weights   10/26/21 0407  Weight: 108.9 kg    Examination:  General exam: Appears calm and comfortable  Respiratory system: Clear to auscultation. Respiratory  effort normal. Cardiovascular system: S1 & S2 heard, RRR. No JVD, murmurs, rubs, gallops or clicks. No pedal edema. Gastrointestinal system: Abdomen is nondistended, soft and nontender. No organomegaly or masses felt. Normal bowel sounds heard. Central nervous system: Alert and oriented. No focal neurological deficits. Extremities: Symmetric 5 x 5 power. Skin: No rashes, lesions or ulcers Psychiatry: Judgement and insight appear normal. Mood & affect appropriate.     Data Reviewed: I have personally reviewed following labs and imaging studies  CBC: Recent Labs  Lab 10/26/21 0440 10/27/21 0400 10/28/21 0402  WBC 3.5* 10.3 9.6  NEUTROABS 3.2  --   --   HGB 15.4 11.9* 12.1*  HCT 45.0 34.6* 36.0*  MCV 94.1 95.1 95.7  PLT 96* 91* 361*   Basic Metabolic Panel: Recent Labs  Lab 10/26/21 0440 10/27/21 0400 10/28/21 0402  NA 131* 136 136  K 3.7 3.3* 3.2*  CL 96* 105 109  CO2 24 26 25   GLUCOSE 107* 187* 128*  BUN 32* 21 13  CREATININE 1.61* 0.88 0.80  CALCIUM 8.6* 8.3* 8.0*  MG  --   --  2.0   GFR: Estimated Creatinine Clearance: 109.3 mL/min (by C-G formula based on SCr of 0.8 mg/dL). Liver Function Tests: Recent Labs  Lab 10/26/21 0440 10/27/21 0400  AST 27 28  ALT 23 21  ALKPHOS 54 35*  BILITOT 1.2 0.5  PROT 7.0 5.3*  ALBUMIN 3.7 2.6*   No results for input(s): LIPASE, AMYLASE in the last 168 hours. No results for input(s): AMMONIA in the last 168 hours. Coagulation Profile: Recent Labs  Lab 10/26/21 0440 10/27/21 0400  INR 2.5* 1.9*   Cardiac Enzymes: No results for input(s): CKTOTAL, CKMB, CKMBINDEX, TROPONINI in the last 168 hours. BNP (last 3 results) No results for input(s): PROBNP in the last 8760 hours. HbA1C: No results for input(s): HGBA1C in the last 72 hours. CBG: No results for input(s): GLUCAP in the last 168 hours. Lipid Profile: No results for input(s): CHOL, HDL, LDLCALC, TRIG, CHOLHDL, LDLDIRECT in the last 72 hours. Thyroid Function  Tests: No results for input(s): TSH, T4TOTAL, FREET4, T3FREE, THYROIDAB in the last 72 hours. Anemia Panel: No results for input(s): VITAMINB12, FOLATE, FERRITIN, TIBC, IRON, RETICCTPCT in the last 72 hours. Sepsis Labs: Recent Labs  Lab 10/26/21 0439 10/26/21 0638 10/27/21 0400  PROCALCITON  --   --  54.74  LATICACIDVEN 2.6* 1.8  --     Recent Results (from the past 240 hour(s))  Resp Panel by RT-PCR (Flu A&B, Covid) Nasopharyngeal Swab     Status: Abnormal   Collection Time: 10/26/21  4:39 AM   Specimen: Nasopharyngeal Swab; Nasopharyngeal(NP) swabs in vial transport medium  Result Value Ref Range Status   SARS Coronavirus 2 by RT PCR POSITIVE (A) NEGATIVE Final    Comment: (NOTE) SARS-CoV-2 target nucleic acids are DETECTED.  The SARS-CoV-2 RNA is generally detectable in upper respiratory specimens during the acute phase of infection. Positive results are indicative of the presence of the identified virus, but do not rule out bacterial infection or co-infection with other pathogens not detected by the test. Clinical correlation with patient history and other diagnostic information is necessary to determine patient infection status. The expected result is Negative.  Fact Sheet for Patients: EntrepreneurPulse.com.au  Fact Sheet for Healthcare Providers: IncredibleEmployment.be  This test is not yet approved or cleared by the [place] and  has been authorized for detection and/or diagnosis of SARS-CoV-2 by FDA under an Emergency Use Authorization (EUA).  This EUA will remain in effect (meaning this test can be used) for the duration of  the COVID-19 declaration under Section 564(b)(1) of the A ct, 21 U.S.C. section 360bbb-3(b)(1), unless the authorization is terminated or revoked sooner.     Influenza A by PCR NEGATIVE NEGATIVE Final   Influenza B by PCR NEGATIVE NEGATIVE Final    Comment: (NOTE) The Xpert Xpress  SARS-CoV-2/FLU/RSV plus assay is intended as an aid in the diagnosis of influenza from Nasopharyngeal swab specimens and should not be used as a sole basis for treatment. Nasal washings and aspirates are unacceptable for Xpert Xpress SARS-CoV-2/FLU/RSV testing.  Fact Sheet for Patients: EntrepreneurPulse.com.au  Fact Sheet for Healthcare Providers: IncredibleEmployment.be  This test is not yet approved or cleared by the [place] FDA and has been authorized for detection and/or diagnosis of SARS-CoV-2 by FDA under an Emergency Use Authorization (EUA). This EUA will remain in effect (meaning this test can be used) for the duration of the COVID-19 declaration under Section 564(b)(1) of the Act, 21 U.S.C. section 360bbb-3(b)(1), unless the authorization is terminated or revoked.  Performed at [facility], [place] [address], [place], [place] [address]   Blood Culture (routine x 2)     Status: Abnormal   Collection Time: 10/26/21  4:39 AM   Specimen: BLOOD RIGHT FOREARM  Result Value Ref Range Status   Specimen Description   Final    BLOOD RIGHT FOREARM Performed at [facility], [place] [address] [place], [place] [address]    Special Requests   Final    BOTTLES DRAWN AEROBIC AND ANAEROBIC Blood Culture results may not be optimal due to an excessive volume of blood received in culture bottles Performed at [place] [address], [place], [place] [address]    Culture  Setup Time   Final    GRAM POSITIVE COCCI IN BOTH AEROBIC AND ANAEROBIC BOTTLES CRITICAL VALUE NOTED.  VALUE IS CONSISTENT WITH PREVIOUSLY REPORTED AND CALLED VALUE.    Culture (A)  Final    STREPTOCOCCUS MITIS/ORALIS SUSCEPTIBILITIES PERFORMED ON PREVIOUS CULTURE WITHIN THE LAST 5 DAYS. Performed at [facility], [place] [address] [place], [place] [address]    Report Status 10/29/2021 FINAL  Final  Blood Culture (routine x  2)     Status: Abnormal   Collection Time: 10/26/21  4:40 AM   Specimen: BLOOD  Result Value Ref Range Status   Specimen Description   Final    BLOOD LEFT ANTECUBITAL Performed at [facility] [address], [place], [place] [address]    Special Requests   Final    BOTTLES DRAWN AEROBIC AND ANAEROBIC Blood Culture results may not be optimal due to an excessive volume of blood received in culture bottles Performed at [facility] [address] [place], [place] [address]    Culture  Setup Time   Final    GRAM POSITIVE COCCI IN BOTH AEROBIC AND ANAEROBIC BOTTLES CRITICAL RESULT CALLED TO, READ BACK BY AND VERIFIED WITH: L POINDEXTER,[email or web address]  10/26/21 [place] Performed at [facility], [place] [address], [place], [place] [address]    Culture STREPTOCOCCUS MITIS/ORALIS (A)  Final   Report Status 10/29/2021 FINAL  Final   Organism ID, Bacteria STREPTOCOCCUS MITIS/ORALIS  Final  Susceptibility   Streptococcus mitis/oralis - MIC*    TETRACYCLINE >=16 RESISTANT Resistant     VANCOMYCIN 0.5 SENSITIVE Sensitive     CLINDAMYCIN <=0.25 SENSITIVE Sensitive     PENICILLIN Value in next row Sensitive      SENSITIVE<=0.06    CEFTRIAXONE Value in next row Sensitive      SENSITIVE<=0.12    * STREPTOCOCCUS MITIS/ORALIS  Urine Culture     Status: Abnormal   Collection Time: 10/26/21  4:40 AM   Specimen: Urine, Clean Catch  Result Value Ref Range Status   Specimen Description   Final    URINE, CLEAN CATCH Performed at [facility], [place] [address], [place], [place] [address]    Special Requests   Final    NONE Performed at [facility], [place] [address] [place], [place] [address]    Culture (A)  Final    <10,000 COLONIES/mL INSIGNIFICANT GROWTH Performed at [place] [address] [place], [place] [address]    Report Status 10/27/2021 FINAL  Final  Blood Culture ID Panel (Reflexed)     Status:  Abnormal   Collection Time: 10/26/21  4:40 AM  Result Value Ref Range Status   Enterococcus faecalis NOT DETECTED NOT DETECTED Final   Enterococcus Faecium NOT DETECTED NOT DETECTED Final   Listeria monocytogenes NOT DETECTED NOT DETECTED Final   Staphylococcus species NOT DETECTED NOT DETECTED Final   Staphylococcus aureus (BCID) NOT DETECTED NOT DETECTED Final   Staphylococcus epidermidis NOT DETECTED NOT DETECTED Final   Staphylococcus lugdunensis NOT DETECTED NOT DETECTED Final   Streptococcus species DETECTED (A) NOT DETECTED Final    Comment: Not Enterococcus species, Streptococcus agalactiae, Streptococcus pyogenes, or Streptococcus pneumoniae. CRITICAL RESULT CALLED TO, READ BACK BY AND VERIFIED WITH: L POINDEXTER,[email or web address]  10/26/21 [place]    Streptococcus agalactiae NOT DETECTED NOT DETECTED Final   Streptococcus pneumoniae NOT DETECTED NOT DETECTED Final   Streptococcus pyogenes NOT DETECTED NOT DETECTED Final   A.calcoaceticus-baumannii NOT DETECTED NOT DETECTED Final   Bacteroides fragilis NOT DETECTED NOT DETECTED Final   Enterobacterales NOT DETECTED NOT DETECTED Final   Enterobacter cloacae complex NOT DETECTED NOT DETECTED Final   Escherichia coli NOT DETECTED NOT DETECTED Final   Klebsiella aerogenes NOT DETECTED NOT DETECTED Final   Klebsiella oxytoca NOT DETECTED NOT DETECTED Final   Klebsiella pneumoniae NOT DETECTED NOT DETECTED Final   Proteus species NOT DETECTED NOT DETECTED Final   Salmonella species NOT DETECTED NOT DETECTED Final   Serratia marcescens NOT DETECTED NOT DETECTED Final   Haemophilus influenzae NOT DETECTED NOT DETECTED Final   Neisseria meningitidis NOT DETECTED NOT DETECTED Final   Pseudomonas aeruginosa NOT DETECTED NOT DETECTED Final   Stenotrophomonas maltophilia NOT DETECTED NOT DETECTED Final   Candida albicans NOT DETECTED NOT DETECTED Final   Candida auris NOT DETECTED NOT DETECTED Final   Candida glabrata NOT DETECTED NOT DETECTED  Final   Candida krusei NOT DETECTED NOT DETECTED Final   Candida parapsilosis NOT DETECTED NOT DETECTED Final   Candida tropicalis NOT DETECTED NOT DETECTED Final   Cryptococcus neoformans/gattii NOT DETECTED NOT DETECTED Final    Comment: Performed at [facility], [address], [place], [place] [address]  Respiratory (~20 pathogens) panel by PCR     Status: None   Collection Time: 10/26/21 11:27 AM   Specimen: Nasopharyngeal Swab; Respiratory  Result Value Ref Range Status   Adenovirus NOT DETECTED NOT DETECTED Final   Coronavirus 229E NOT DETECTED  NOT DETECTED Final    Comment: (NOTE) The Coronavirus on the Respiratory Panel, DOES NOT test for the novel  Coronavirus (2019 nCoV)    Coronavirus HKU1 NOT DETECTED NOT DETECTED Final   Coronavirus NL63 NOT DETECTED NOT DETECTED Final   Coronavirus OC43 NOT DETECTED NOT DETECTED Final   Metapneumovirus NOT DETECTED NOT DETECTED Final   Rhinovirus / Enterovirus NOT DETECTED NOT DETECTED Final   Influenza A NOT DETECTED NOT DETECTED Final   Influenza B NOT DETECTED NOT DETECTED Final   Parainfluenza Virus 1 NOT DETECTED NOT DETECTED Final   Parainfluenza Virus 2 NOT DETECTED NOT DETECTED Final   Parainfluenza Virus 3 NOT DETECTED NOT DETECTED Final   Parainfluenza Virus 4 NOT DETECTED NOT DETECTED Final   Respiratory Syncytial Virus NOT DETECTED NOT DETECTED Final   Bordetella pertussis NOT DETECTED NOT DETECTED Final   Bordetella Parapertussis NOT DETECTED NOT DETECTED Final   Chlamydophila pneumoniae NOT DETECTED NOT DETECTED Final   Mycoplasma pneumoniae NOT DETECTED NOT DETECTED Final    Comment: Performed at [facility], [place] [address], [place], [place] [address]  Culture, blood (routine x 2)     Status: None (Preliminary result)   Collection Time: 10/28/21  4:02 AM   Specimen: BLOOD RIGHT HAND  Result Value Ref Range Status   Specimen Description   Final    BLOOD RIGHT HAND Performed at [place] [address], [place], [name] [address]    Special Requests   Final    BOTTLES DRAWN AEROBIC AND ANAEROBIC Blood Culture adequate volume Performed at [facility] [address] [place], [place] [address]    Culture   Final    NO GROWTH 1 DAY Performed at [facility], [place] [address] [place], [place] [address]    Report Status PENDING  Incomplete  Culture, blood (routine x 2)     Status: None (Preliminary result)   Collection Time: 10/28/21  4:02 AM   Specimen: BLOOD LEFT HAND  Result Value Ref Range Status   Specimen Description   Final    BLOOD LEFT HAND Performed at [facility] [address] [place], [place] [address]    Special Requests   Final    BOTTLES DRAWN AEROBIC ONLY Blood Culture adequate volume Performed at [facility] [address] [place], [place] [address]    Culture   Final    NO GROWTH 1 DAY Performed at [facility], [place] [address] [place], [place] [address]    Report Status PENDING  Incomplete         Radiology Studies: MR BRAIN WO CONTRAST  Result Date: 10/29/2021 CLINICAL DATA:  Initial evaluation for meningitis, CNS infection. EXAM: MRI HEAD WITHOUT CONTRAST TECHNIQUE: Multiplanar, multiecho pulse sequences of the brain and surrounding structures were obtained without intravenous contrast. COMPARISON:  MRI from 12/25/2020. FINDINGS: Brain: Susceptibility artifact emanates from the right suboccipital region, mildly limited exam. Cerebral volume within normal limits. Mild patchy and hazy T2/FLAIR hyperintensity involving the periventricular deep white matter both cerebral hemispheres, most consistent with chronic small vessel ischemic disease, mild in nature. Mild patchy involvement the pons. Small remote lacunar infarct present at the left caudate. No abnormal foci of restricted diffusion to suggest acute or subacute ischemia. Gray-white matter differentiation  maintained. No encephalomalacia to suggest chronic cortical infarction. No acute intracranial hemorrhage. Chronic microhemorrhage at the mid left frontal region noted, stable. Few additional subtle punctate  chronic micro hemorrhages noted as well. Findings likely small vessel/hypertensive in nature. No mass lesion, midline shift, or mass effect. No hydrocephalus or extra-axial fluid collection. No signal changes to suggest encephalitis or cerebritis. No visible foci of septic emboli. No visible changes of meningitis on this noncontrast examination. No intraventricular debris. Pituitary gland suprasellar region normal. Midline structures intact. Vascular: Major intracranial vascular flow voids are maintained. Skull and upper cervical spine: Craniocervical junction within normal limits. Bone marrow signal intensity within normal limits. No scalp soft tissue abnormality. Sinuses/Orbits: Globes orbital soft tissues demonstrate no acute finding. Small retention cyst noted within the right sphenoid sinus. Mild mucosal thickening noted within the ethmoidal air cells. Paranasal sinuses are otherwise clear. Small bilateral mastoid effusions noted. Inner ear structures grossly normal. Other: None. IMPRESSION: 1. No acute intracranial abnormality. No MRI evidence for acute intracranial infection on this noncontrast examination. 2. Mild chronic microvascular ischemic disease 3. Small bilateral mastoid effusions, of uncertain significance. Correlation with physical exam recommended. Electronically Signed   By: Jeannine Boga M.D.   On: 10/29/2021 05:14   ECHOCARDIOGRAM COMPLETE  Result Date: 10/28/2021    ECHOCARDIOGRAM REPORT   Patient Name:   JAMAUL HEIST [facility] Date of Exam: 10/28/2021 Medical Rec #:  [id number]        Height:       70.0 in Accession #:    [id number]       Weight:       240.0 lb Date of Birth:  05-16-1951        BSA:          2.255 m Patient Age:    63 years         BP:           114/82 mmHg Patient  Gender: M                HR:           91 bpm. Exam Location:  Inpatient Procedure: 2D Echo, Cardiac Doppler and Color Doppler Indications:     Bacteremia  History:         Patient has prior history of Echocardiogram examinations, most                  recent 05/01/2021. Arrythmias:Atrial Fibrillation.                  Aortic Valve: 25 mm Edwards valve is present in the aortic                  position. Procedure Date: 07/02/17.  Sonographer:     Bernadene Person RDCS Referring Phys:  [place] Diagnosing Phys: Oswaldo Milian MD IMPRESSIONS  1. Left ventricular ejection fraction, by estimation, is 50 to 55%. The left ventricle has low normal function. The left ventricle has no regional wall motion abnormalities. There is mild left ventricular hypertrophy. Left ventricular diastolic parameters are consistent with Grade II diastolic dysfunction (pseudonormalization). Elevated left atrial pressure.  2. Right ventricular systolic function is mildly reduced. The right ventricular size is normal. There is moderately elevated pulmonary artery systolic pressure. The estimated right ventricular systolic pressure is 25.9 mmHg.  3. Right atrial size was mildly dilated.  4. The mitral valve is normal in structure. No evidence of mitral valve regurgitation.  5. There is a 25 mm Edwards bioprosthetic valve present in the aortic position     Aortic valve regurgitation is not visualized. MG 3mmHg, stable from prior echo 05/01/21  6. The inferior vena cava is dilated in size with >50% respiratory variability, suggesting right atrial pressure of 8 mmHg. Conclusion(s)/Recommendation(s): No clear vegetation seen. Given bioprosthetic aortic valve, recommend TEE for further evaluation. FINDINGS  Left Ventricle: Left ventricular ejection fraction, by estimation, is 50 to 55%. The left ventricle has low normal function. The left ventricle has no regional wall motion abnormalities. The left ventricular internal cavity size was  normal in size. There is mild left ventricular hypertrophy. Left ventricular diastolic parameters are consistent with Grade II diastolic dysfunction (pseudonormalization). Elevated left atrial pressure. Right Ventricle: The right ventricular size is normal. Right vetricular wall thickness was not well visualized. Right ventricular systolic function is mildly reduced. There is moderately elevated pulmonary artery systolic pressure. The tricuspid regurgitant velocity is 3.09 m/s, and with an assumed right atrial pressure of 8 mmHg, the estimated right ventricular systolic pressure is 19.6 mmHg. Left Atrium: Left atrial size was normal in size. Right Atrium: Right atrial size was mildly dilated. Pericardium: Trivial pericardial effusion is present. Presence of epicardial fat layer. Mitral Valve: The mitral valve is normal in structure. No evidence of mitral valve regurgitation. Tricuspid Valve: The tricuspid valve is normal in structure. Tricuspid valve regurgitation is trivial. Aortic Valve: The aortic valve has been repaired/replaced. Aortic valve regurgitation is not visualized. Aortic valve mean gradient measures 18.0 mmHg. Aortic valve peak gradient measures 31.7 mmHg. Aortic valve area, by VTI measures 2.08 cm. There is a  25 mm Edwards valve present in the aortic position. Procedure Date: 07/02/17. Pulmonic Valve: The pulmonic valve was not well visualized. Pulmonic valve regurgitation is not visualized. Aorta: The aortic root and ascending aorta are structurally normal, with no evidence of dilitation. Venous: The inferior vena cava is dilated in size with greater than 50% respiratory variability, suggesting right atrial pressure of 8 mmHg. IAS/Shunts: No atrial level shunt detected by color flow Doppler.  LEFT VENTRICLE PLAX 2D LVIDd:         5.20 cm      Diastology LVIDs:         3.10 cm      LV e' medial:    6.15 cm/s LV PW:         1.20 cm      LV E/e' medial:  16.9 LV IVS:        1.30 cm      LV e' lateral:    11.10 cm/s LVOT diam:     2.10 cm      LV E/e' lateral: 9.4 LV SV:         113 LV SV Index:   50 LVOT Area:     3.46 cm  LV Volumes (MOD) LV vol d, MOD A2C: 143.0 ml LV vol d, MOD A4C: 205.0 ml LV vol s, MOD A2C: 69.0 ml LV vol s, MOD A4C: 91.3 ml LV SV MOD A2C:     74.0 ml LV SV MOD A4C:     205.0 ml LV SV MOD BP:      94.8 ml RIGHT VENTRICLE RV S prime:     6.90 cm/s TAPSE (M-mode): 1.4 cm LEFT ATRIUM           Index        RIGHT ATRIUM           Index LA diam:      4.60 cm 2.04 cm/m   RA Area:     23.80 cm LA Vol (A2C): 70.3 ml 31.17 ml/m  RA  Volume:   67.70 ml  30.02 ml/m LA Vol (A4C): 84.9 ml 37.64 ml/m  AORTIC VALVE AV Area (Vmax):    1.96 cm AV Area (Vmean):   1.82 cm AV Area (VTI):     2.08 cm AV Vmax:           281.50 cm/s AV Vmean:          197.750 cm/s AV VTI:            0.546 m AV Peak Grad:      31.7 mmHg AV Mean Grad:      18.0 mmHg LVOT Vmax:         159.00 cm/s LVOT Vmean:        104.000 cm/s LVOT VTI:          0.327 m LVOT/AV VTI ratio: 0.60  AORTA Ao Root diam: 3.60 cm Ao Asc diam:  3.50 cm MITRAL VALVE                TRICUSPID VALVE MV Area (PHT): 3.68 cm     TR Peak grad:   38.2 mmHg MV Decel Time: 206 msec     TR Vmax:        309.00 cm/s MV E velocity: 104.00 cm/s MV A velocity: 68.30 cm/s   SHUNTS MV E/A ratio:  1.52         Systemic VTI:  0.33 m                             Systemic Diam: 2.10 cm Oswaldo Milian MD Electronically signed by Oswaldo Milian MD Signature Date/Time: 10/28/2021/6:15:23 PM    Final (Updated)    CT MAXILLOFACIAL WO CONTRAST  Result Date: 10/28/2021 CLINICAL DATA:  Concern for maxillary/facial abscess; no facial pain complaints. Unclear source of bacteremia, recent dental procedures EXAM: CT MAXILLOFACIAL WITHOUT CONTRAST TECHNIQUE: Multidetector CT imaging of the maxillofacial structures was performed. Multiplanar CT image reconstructions were also generated. COMPARISON:  CT sinuses 09/18/2020 and CT head 02/16/2019. FINDINGS: Osseous: No  fracture or mandibular dislocation. No destructive process. No significant periapical lucency about the maxillary or mandibular teeth. Orbits: Negative. No traumatic or inflammatory finding. Sinuses: Small mucous retention cysts in the sphenoid sinuses. Otherwise clear. Soft tissues: Negative. Evaluation for infection is somewhat limited in the absence of intravenous contrast. Within this limitation, no low-density collection to suggest abscess or phlegmon. Limited intracranial: No significant or unexpected finding. IMPRESSION: No acute fracture or destructive osseous lesion. No noncontrast evidence of abscess or phlegmon. Electronically Signed   By: Merilyn Baba M.D.   On: 10/28/2021 12:33        Scheduled Meds:  finasteride  5 mg Oral QHS   guaiFENesin  600 mg Oral BID   levothyroxine  112 mcg Oral QAC breakfast   liothyronine  25 mcg Oral QAC breakfast   metoprolol tartrate  12.5 mg Oral BID   mometasone-formoterol  2 puff Inhalation BID   pantoprazole  40 mg Oral BID   [START ON 10/30/2021] psyllium  1 packet Oral Daily   rivaroxaban  20 mg Oral Q supper   tamsulosin  0.4 mg Oral BID   venlafaxine XR  150 mg Oral Q breakfast   zolpidem  5 mg Oral QHS   Continuous Infusions:  [START ON 10/30/2021] penicillin g continuous IV infusion       LOS: 3 days    Time spent: 10mins    [employer],  MD [facility]   If 7PM-7AM, please contact night-coverage [email or web address]  10/29/2021, 10:51 PM

## 2021-10-30 DIAGNOSIS — R7881 Bacteremia: Secondary | ICD-10-CM | POA: Diagnosis not present

## 2021-10-30 DIAGNOSIS — N179 Acute kidney failure, unspecified: Secondary | ICD-10-CM | POA: Diagnosis not present

## 2021-10-30 DIAGNOSIS — D696 Thrombocytopenia, unspecified: Secondary | ICD-10-CM

## 2021-10-30 DIAGNOSIS — A419 Sepsis, unspecified organism: Secondary | ICD-10-CM | POA: Diagnosis not present

## 2021-10-30 DIAGNOSIS — U071 COVID-19: Secondary | ICD-10-CM | POA: Diagnosis not present

## 2021-10-30 DIAGNOSIS — Z952 Presence of prosthetic heart valve: Secondary | ICD-10-CM | POA: Diagnosis not present

## 2021-10-30 DIAGNOSIS — B955 Unspecified streptococcus as the cause of diseases classified elsewhere: Secondary | ICD-10-CM | POA: Diagnosis not present

## 2021-10-30 LAB — BASIC METABOLIC PANEL
Anion gap: 4 — ABNORMAL LOW (ref 5–15)
BUN: 11 mg/dL (ref 8–23)
CO2: 26 mmol/L (ref 22–32)
Calcium: 8.6 mg/dL — ABNORMAL LOW (ref 8.9–10.3)
Chloride: 106 mmol/L (ref 98–111)
Creatinine, Ser: 0.99 mg/dL (ref 0.61–1.24)
GFR, Estimated: >60 mL/min (ref >60)
Glucose, Bld: 107 mg/dL — ABNORMAL HIGH (ref 70–99)
Potassium: 4.2 mmol/L (ref 3.5–5.1)
Sodium: 136 mmol/L (ref 135–145)

## 2021-10-30 LAB — CBC
HCT: 39.6 % (ref 39.0–52.0)
Hemoglobin: 12.9 g/dL — ABNORMAL LOW (ref 13.0–17.0)
MCH: 31.7 pg (ref 26.0–34.0)
MCHC: 32.6 g/dL (ref 30.0–36.0)
MCV: 97.3 fL (ref 80.0–100.0)
Platelets: 147 10*3/uL — ABNORMAL LOW (ref 150–400)
RBC: 4.07 MIL/uL — ABNORMAL LOW (ref 4.22–5.81)
RDW: 13.1 % (ref 11.5–15.5)
WBC: 10.1 10*3/uL (ref 4.0–10.5)
nRBC: 0 % (ref 0.0–0.2)

## 2021-10-30 LAB — MAGNESIUM: Magnesium: 1.6 mg/dL — ABNORMAL LOW (ref 1.7–2.4)

## 2021-10-30 MED ORDER — ALBUTEROL SULFATE (2.5 MG/3ML) 0.083% IN NEBU
2.5000 mg | INHALATION_SOLUTION | Freq: Four times a day (QID) | RESPIRATORY_TRACT | Status: DC | PRN
  Administered 2021-10-30 – 2021-11-03 (×5): 2.5 mg via RESPIRATORY_TRACT
  Filled 2021-10-30 (×4): qty 3

## 2021-10-30 MED ORDER — MAGNESIUM SULFATE IN D5W 1-5 GM/100ML-% IV SOLN
1.0000 g | Freq: Once | INTRAVENOUS | Status: AC
  Administered 2021-10-30: 1 g via INTRAVENOUS
  Filled 2021-10-30: qty 100

## 2021-10-30 NOTE — Plan of Care (Signed)
  Problem: Education: Goal: Knowledge of General Education information will improve Description: Including pain rating scale, medication(s)/side effects and non-pharmacologic comfort measures Outcome: Progressing   Problem: Pain Managment: Goal: General experience of comfort will improve Outcome: Progressing   Problem: Skin Integrity: Goal: Risk for impaired skin integrity will decrease Outcome: Progressing   

## 2021-10-30 NOTE — Progress Notes (Signed)
[name]  Date of Admission:  10/26/2021           Reason for visit: Follow up on strep mitis/oralis bacteremia  Current antibiotics: PCN G 1/11- present   Previous antibiotics: Cefepime 1/7-1/8 Metronidazole 1/7/1/8 Vancomycin 1/7-1/8 Ceftriaxone 1/8-1/10  ASSESSMENT:    71 y.o. male admitted with:  Streptococcus mitis/oralis bacteremia: Suspected odontogenic source of infection in light of relatively recent dental procedures.  Fortunately, CT maxillofacial was negative for destructive osseous lesions and no noncontrast evidence of abscess or phlegmon was seen.  Further imaging of this chest, abdomen, pelvis with CT scan was unrevealing and MRI brain did not reveal any evidence of CNS septic emboli.  TTE was negative for vegetation and TEE is scheduled for this Friday to further evaluate for prosthetic valve endocarditis. Status post Bentall procedure and aortic valve replacement in 5885: Complicated by chronic fibrous malunion of the sternum previously noted on CT scan. Sepsis: Secondary to #1.  Improved. Acute kidney injury: Patient presented with creatinine 1.61.  This has improved and creatinine today is 0.99. Thrombocytopenia: Likely related to sepsis and improved. Recent COVID infection: Diagnosed 10/07/2021 and received a course of antivirals.  RECOMMENDATIONS:    Continue penicillin G IV Follow-up repeat blood cultures Await TEE Will follow.   Principal Problem:   Streptococcal bacteremia Active Problems:   Obstructive sleep apnea   PAF (paroxysmal atrial fibrillation) (HCC)   Chronic anticoagulation   S/P AVR (aortic valve replacement) and aortoplasty   Sepsis (HCC)   BPH (benign prostatic hyperplasia)   AKI (acute kidney injury) ([place])   COVID-19    MEDICATIONS:    Scheduled Meds:  finasteride  5 mg Oral QHS   guaiFENesin  600 mg Oral BID   levothyroxine  112 mcg Oral QAC breakfast   liothyronine  25 mcg Oral QAC  breakfast   metoprolol tartrate  12.5 mg Oral BID   mometasone-formoterol  2 puff Inhalation BID   pantoprazole  40 mg Oral BID   psyllium  1 packet Oral Daily   rivaroxaban  20 mg Oral Q supper   tamsulosin  0.4 mg Oral BID   venlafaxine XR  150 mg Oral Q breakfast   zolpidem  5 mg Oral QHS   Continuous Infusions:  penicillin g continuous IV infusion     PRN Meds:.acetaminophen **OR** acetaminophen, albuterol, ALPRAZolam, azelastine, benzonatate, diphenhydrAMINE, ondansetron **OR** ondansetron (ZOFRAN) IV, rizatriptan, rizatriptan, sodium chloride  SUBJECTIVE:   24 hour events:  NAEON Afebrile WBC 10.1 Creat 0.99 No new images Repeat cx NGTD   No new complaints this morning.  Reports that he slept better last night.  He wants to take a shower today.  He is hopeful to work with physical therapy/Occupational Therapy in the next day or so.  Review of Systems  All other systems reviewed and are negative.    OBJECTIVE:   Blood pressure (!) 137/95, pulse 72, temperature 98.2 F (36.8 C), temperature source Oral, resp. rate 16, height 5\' 10"  (1.778 m), weight 108.9 kg, SpO2 92 %. Body mass index is 34.44 kg/m.  Physical Exam Constitutional:      General: He is not in acute distress.    Appearance: Normal appearance.  HENT:     Head: Normocephalic and atraumatic.  Eyes:     Extraocular Movements: Extraocular movements intact.     Conjunctiva/sclera: Conjunctivae normal.  Pulmonary:     Effort: Pulmonary effort is normal. No respiratory distress.  Abdominal:  General: There is no distension.     Palpations: Abdomen is soft.     Tenderness: There is no abdominal tenderness.  Musculoskeletal:        General: Normal range of motion.     Cervical back: Normal range of motion and neck supple.  Skin:    General: Skin is warm and dry.  Neurological:     General: No focal deficit present.     Mental Status: He is alert. Mental status is at baseline.  Psychiatric:         Mood and Affect: Mood normal.        Behavior: Behavior normal.     Lab Results: Lab Results  Component Value Date   WBC 10.1 10/30/2021   HGB 12.9 (L) 10/30/2021   HCT 39.6 10/30/2021   MCV 97.3 10/30/2021   PLT 147 (L) 10/30/2021    Lab Results  Component Value Date   NA 136 10/30/2021   K 4.2 10/30/2021   CO2 26 10/30/2021   GLUCOSE 107 (H) 10/30/2021   BUN 11 10/30/2021   CREATININE 0.99 10/30/2021   CALCIUM 8.6 (L) 10/30/2021   GFRNONAA >60 10/30/2021   GFRAA >60 02/16/2019    Lab Results  Component Value Date   ALT 21 10/27/2021   AST 28 10/27/2021   ALKPHOS 35 (L) 10/27/2021   BILITOT 0.5 10/27/2021       Component Value Date/Time   CRP 5.6 (H) 10/13/2020 0550       Component Value Date/Time   ESRSEDRATE 6 03/08/2018 0020     I have reviewed the micro and lab results in Epic.  Imaging: MR BRAIN WO CONTRAST  Result Date: 10/29/2021 CLINICAL DATA:  Initial evaluation for meningitis, CNS infection. EXAM: MRI HEAD WITHOUT CONTRAST TECHNIQUE: Multiplanar, multiecho pulse sequences of the brain and surrounding structures were obtained without intravenous contrast. COMPARISON:  MRI from 12/25/2020. FINDINGS: Brain: Susceptibility artifact emanates from the right suboccipital region, mildly limited exam. Cerebral volume within normal limits. Mild patchy and hazy T2/FLAIR hyperintensity involving the periventricular deep white matter both cerebral hemispheres, most consistent with chronic small vessel ischemic disease, mild in nature. Mild patchy involvement the pons. Small remote lacunar infarct present at the left caudate. No abnormal foci of restricted diffusion to suggest acute or subacute ischemia. Gray-white matter differentiation maintained. No encephalomalacia to suggest chronic cortical infarction. No acute intracranial hemorrhage. Chronic microhemorrhage at the mid left frontal region noted, stable. Few additional subtle punctate chronic micro hemorrhages  noted as well. Findings likely small vessel/hypertensive in nature. No mass lesion, midline shift, or mass effect. No hydrocephalus or extra-axial fluid collection. No signal changes to suggest encephalitis or cerebritis. No visible foci of septic emboli. No visible changes of meningitis on this noncontrast examination. No intraventricular debris. Pituitary gland suprasellar region normal. Midline structures intact. Vascular: Major intracranial vascular flow voids are maintained. Skull and upper cervical spine: Craniocervical junction within normal limits. Bone marrow signal intensity within normal limits. No scalp soft tissue abnormality. Sinuses/Orbits: Globes orbital soft tissues demonstrate no acute finding. Small retention cyst noted within the right sphenoid sinus. Mild mucosal thickening noted within the ethmoidal air cells. Paranasal sinuses are otherwise clear. Small bilateral mastoid effusions noted. Inner ear structures grossly normal. Other: None. IMPRESSION: 1. No acute intracranial abnormality. No MRI evidence for acute intracranial infection on this noncontrast examination. 2. Mild chronic microvascular ischemic disease 3. Small bilateral mastoid effusions, of uncertain significance. Correlation with physical exam recommended. Electronically Signed  By: Jeannine Boga M.D.   On: 10/29/2021 05:14   ECHOCARDIOGRAM COMPLETE  Result Date: 10/28/2021    ECHOCARDIOGRAM REPORT   Patient Name:   BETHEL GAGLIO [facility] Date of Exam: 10/28/2021 Medical Rec #:  [id number]        Height:       70.0 in Accession #:    [id number]       Weight:       240.0 lb Date of Birth:  07/08/1951        BSA:          2.255 m Patient Age:    25 years         BP:           114/82 mmHg Patient Gender: M                HR:           91 bpm. Exam Location:  Inpatient Procedure: 2D Echo, Cardiac Doppler and Color Doppler Indications:     Bacteremia  History:         Patient has prior history of Echocardiogram examinations, most                   recent 05/01/2021. Arrythmias:Atrial Fibrillation.                  Aortic Valve: 25 mm Edwards valve is present in the aortic                  position. Procedure Date: 07/02/17.  Sonographer:     Bernadene Person RDCS Referring Phys:  [place] Diagnosing Phys: Oswaldo Milian MD IMPRESSIONS  1. Left ventricular ejection fraction, by estimation, is 50 to 55%. The left ventricle has low normal function. The left ventricle has no regional wall motion abnormalities. There is mild left ventricular hypertrophy. Left ventricular diastolic parameters are consistent with Grade II diastolic dysfunction (pseudonormalization). Elevated left atrial pressure.  2. Right ventricular systolic function is mildly reduced. The right ventricular size is normal. There is moderately elevated pulmonary artery systolic pressure. The estimated right ventricular systolic pressure is 63.0 mmHg.  3. Right atrial size was mildly dilated.  4. The mitral valve is normal in structure. No evidence of mitral valve regurgitation.  5. There is a 25 mm Edwards bioprosthetic valve present in the aortic position     Aortic valve regurgitation is not visualized. MG 83mmHg, stable from prior echo 05/01/21  6. The inferior vena cava is dilated in size with >50% respiratory variability, suggesting right atrial pressure of 8 mmHg. Conclusion(s)/Recommendation(s): No clear vegetation seen. Given bioprosthetic aortic valve, recommend TEE for further evaluation. FINDINGS  Left Ventricle: Left ventricular ejection fraction, by estimation, is 50 to 55%. The left ventricle has low normal function. The left ventricle has no regional wall motion abnormalities. The left ventricular internal cavity size was normal in size. There is mild left ventricular hypertrophy. Left ventricular diastolic parameters are consistent with Grade II diastolic dysfunction (pseudonormalization). Elevated left atrial pressure. Right Ventricle: The right ventricular  size is normal. Right vetricular wall thickness was not well visualized. Right ventricular systolic function is mildly reduced. There is moderately elevated pulmonary artery systolic pressure. The tricuspid regurgitant velocity is 3.09 m/s, and with an assumed right atrial pressure of 8 mmHg, the estimated right ventricular systolic pressure is 16.0 mmHg. Left Atrium: Left atrial size was normal in size. Right Atrium: Right atrial size was mildly dilated. Pericardium: Trivial  pericardial effusion is present. Presence of epicardial fat layer. Mitral Valve: The mitral valve is normal in structure. No evidence of mitral valve regurgitation. Tricuspid Valve: The tricuspid valve is normal in structure. Tricuspid valve regurgitation is trivial. Aortic Valve: The aortic valve has been repaired/replaced. Aortic valve regurgitation is not visualized. Aortic valve mean gradient measures 18.0 mmHg. Aortic valve peak gradient measures 31.7 mmHg. Aortic valve area, by VTI measures 2.08 cm. There is a  25 mm Edwards valve present in the aortic position. Procedure Date: 07/02/17. Pulmonic Valve: The pulmonic valve was not well visualized. Pulmonic valve regurgitation is not visualized. Aorta: The aortic root and ascending aorta are structurally normal, with no evidence of dilitation. Venous: The inferior vena cava is dilated in size with greater than 50% respiratory variability, suggesting right atrial pressure of 8 mmHg. IAS/Shunts: No atrial level shunt detected by color flow Doppler.  LEFT VENTRICLE PLAX 2D LVIDd:         5.20 cm      Diastology LVIDs:         3.10 cm      LV e' medial:    6.15 cm/s LV PW:         1.20 cm      LV E/e' medial:  16.9 LV IVS:        1.30 cm      LV e' lateral:   11.10 cm/s LVOT diam:     2.10 cm      LV E/e' lateral: 9.4 LV SV:         113 LV SV Index:   50 LVOT Area:     3.46 cm  LV Volumes (MOD) LV vol d, MOD A2C: 143.0 ml LV vol d, MOD A4C: 205.0 ml LV vol s, MOD A2C: 69.0 ml LV vol s, MOD  A4C: 91.3 ml LV SV MOD A2C:     74.0 ml LV SV MOD A4C:     205.0 ml LV SV MOD BP:      94.8 ml RIGHT VENTRICLE RV S prime:     6.90 cm/s TAPSE (M-mode): 1.4 cm LEFT ATRIUM           Index        RIGHT ATRIUM           Index LA diam:      4.60 cm 2.04 cm/m   RA Area:     23.80 cm LA Vol (A2C): 70.3 ml 31.17 ml/m  RA Volume:   67.70 ml  30.02 ml/m LA Vol (A4C): 84.9 ml 37.64 ml/m  AORTIC VALVE AV Area (Vmax):    1.96 cm AV Area (Vmean):   1.82 cm AV Area (VTI):     2.08 cm AV Vmax:           281.50 cm/s AV Vmean:          197.750 cm/s AV VTI:            0.546 m AV Peak Grad:      31.7 mmHg AV Mean Grad:      18.0 mmHg LVOT Vmax:         159.00 cm/s LVOT Vmean:        104.000 cm/s LVOT VTI:          0.327 m LVOT/AV VTI ratio: 0.60  AORTA Ao Root diam: 3.60 cm Ao Asc diam:  3.50 cm MITRAL VALVE  TRICUSPID VALVE MV Area (PHT): 3.68 cm     TR Peak grad:   38.2 mmHg MV Decel Time: 206 msec     TR Vmax:        309.00 cm/s MV E velocity: 104.00 cm/s MV A velocity: 68.30 cm/s   SHUNTS MV E/A ratio:  1.52         Systemic VTI:  0.33 m                             Systemic Diam: 2.10 cm Oswaldo Milian MD Electronically signed by Oswaldo Milian MD Signature Date/Time: 10/28/2021/6:15:23 PM    Final (Updated)    CT MAXILLOFACIAL WO CONTRAST  Result Date: 10/28/2021 CLINICAL DATA:  Concern for maxillary/facial abscess; no facial pain complaints. Unclear source of bacteremia, recent dental procedures EXAM: CT MAXILLOFACIAL WITHOUT CONTRAST TECHNIQUE: Multidetector CT imaging of the maxillofacial structures was performed. Multiplanar CT image reconstructions were also generated. COMPARISON:  CT sinuses 09/18/2020 and CT head 02/16/2019. FINDINGS: Osseous: No fracture or mandibular dislocation. No destructive process. No significant periapical lucency about the maxillary or mandibular teeth. Orbits: Negative. No traumatic or inflammatory finding. Sinuses: Small mucous retention cysts in the sphenoid  sinuses. Otherwise clear. Soft tissues: Negative. Evaluation for infection is somewhat limited in the absence of intravenous contrast. Within this limitation, no low-density collection to suggest abscess or phlegmon. Limited intracranial: No significant or unexpected finding. IMPRESSION: No acute fracture or destructive osseous lesion. No noncontrast evidence of abscess or phlegmon. Electronically Signed   By: Merilyn Baba M.D.   On: 10/28/2021 12:33     Imaging independently reviewed in Epic.    [facility] [phone] pager 10/30/2021, 9:07 AM  I spent greater than 35 minutes with the patient including greater than 50% of time in face to face counsel of the patient and in coordination of their care.

## 2021-10-30 NOTE — Progress Notes (Signed)
PROGRESS NOTE  SAHITH NURSE DDU:[id number] DOB: 12/01/1950 DOA: 10/26/2021 PCP: Shirline Frees, MD  Brief History   71 year old male with a history of atrial fibrillation on Xarelto, recent COVID infection, hypothyroidism, presented with worsening fatigue, decreased p.o. intake, fevers and shaking chills that began approximately 3 to 4 days prior to admission.  He was found to have streptococcal bacteremia.  Source of bacteremia is not entirely clear.  He has been started on IV antibiotics and admitted for further work-up.  Consultants  Infectious disease  Procedures  None  Antibiotics   Anti-infectives (From admission, onward)    Start     Dose/Rate Route Frequency Ordered Stop   10/30/21 1000  penicillin G potassium 12 Million Units in dextrose 5 % 500 mL continuous infusion        12 Million Units 41.7 mL/hr over 12 Hours Intravenous Every 12 hours 10/29/21 1455     10/27/21 1400  cefTRIAXone (ROCEPHIN) 2 g in sodium chloride 0.9 % 100 mL IVPB  Status:  Discontinued        2 g 200 mL/hr over 30 Minutes Intravenous Every 24 hours 10/27/21 1150 10/29/21 1455   10/27/21 0400  vancomycin (VANCOREADY) IVPB 1250 mg/250 mL  Status:  Discontinued        1,250 mg 166.7 mL/hr over 90 Minutes Intravenous Every 24 hours 10/26/21 1234 10/26/21 1235   10/27/21 0400  vancomycin (VANCOREADY) IVPB 1250 mg/250 mL  Status:  Discontinued        1,250 mg 166.7 mL/hr over 90 Minutes Intravenous Every 24 hours 10/26/21 1235 10/27/21 1149   10/26/21 1800  metroNIDAZOLE (FLAGYL) IVPB 500 mg  Status:  Discontinued        500 mg 100 mL/hr over 60 Minutes Intravenous Every 12 hours 10/26/21 1159 10/27/21 1709   10/26/21 1800  ceFEPIme (MAXIPIME) 2 g in sodium chloride 0.9 % 100 mL IVPB  Status:  Discontinued        2 g 200 mL/hr over 30 Minutes Intravenous Every 12 hours 10/26/21 1221 10/27/21 1149   10/26/21 0630  vancomycin (VANCOCIN) IVPB 1000 mg/200 mL premix        1,000 mg 200 mL/hr over 60  Minutes Intravenous  Once 10/26/21 0533 10/26/21 0737   10/26/21 0530  ceFEPIme (MAXIPIME) 2 g in sodium chloride 0.9 % 100 mL IVPB        2 g 200 mL/hr over 30 Minutes Intravenous  Once 10/26/21 0517 10/26/21 0623   10/26/21 0530  metroNIDAZOLE (FLAGYL) IVPB 500 mg        500 mg 100 mL/hr over 60 Minutes Intravenous  Once 10/26/21 0517 10/26/21 0636   10/26/21 0530  vancomycin (VANCOCIN) IVPB 1000 mg/200 mL premix        1,000 mg 200 mL/hr over 60 Minutes Intravenous  Once 10/26/21 0517 10/26/21 0636      Subjective  The patient is resting quietly. No new complaints.   Objective   Vitals:  Vitals:   10/30/21 0828 10/30/21 1504  BP:  132/77  Pulse:  90  Resp:  18  Temp:  98.6 F (37 C)  SpO2: 92% 99%    Exam:  Constitutional:  The patient is awake, alert, and oriented x 3. No acute distress. Respiratory:  No increased work of breathing. No wheezes, rales, or rhonchi No tactile fremitus Cardiovascular:  Regular rate and rhythm No murmurs, ectopy, or gallups. No lateral PMI. No thrills. Abdomen:  Abdomen is soft, non-tender, non-distended No hernias, masses,  or organomegaly Normoactive bowel sounds.  Musculoskeletal:  No cyanosis, clubbing, or edema Skin:  No rashes, lesions, ulcers palpation of skin: no induration or nodules Neurologic:  CN 2-12 intact Sensation all 4 extremities intact Psychiatric:  Mental status Mood, affect appropriate Orientation to person, place, time  judgment and insight appear intact  I have personally reviewed the following:   Today's Data  Vitals  Lab Data  BMP CBC  Micro Data  Blood culture x 2: streptococcus mitis/oralis Urine culture: Insignificant growth  Imaging  CXR Maxillofacial CT MR brain without contrast  Cardiology Data  EKG Echocardiogram  Other Data    Scheduled Meds:  finasteride  5 mg Oral QHS   guaiFENesin  600 mg Oral BID   levothyroxine  112 mcg Oral QAC breakfast   liothyronine  25 mcg  Oral QAC breakfast   metoprolol tartrate  12.5 mg Oral BID   mometasone-formoterol  2 puff Inhalation BID   pantoprazole  40 mg Oral BID   psyllium  1 packet Oral Daily   rivaroxaban  20 mg Oral Q supper   tamsulosin  0.4 mg Oral BID   venlafaxine XR  150 mg Oral Q breakfast   zolpidem  5 mg Oral QHS   Continuous Infusions:  penicillin g continuous IV infusion 12 Million Units (10/30/21 1106)    Principal Problem:   Streptococcal bacteremia Active Problems:   Obstructive sleep apnea   PAF (paroxysmal atrial fibrillation) (HCC)   Chronic anticoagulation   S/P AVR (aortic valve replacement) and aortoplasty   Sepsis (HCC)   BPH (benign prostatic hyperplasia)   AKI (acute kidney injury) ([place])   COVID-19   LOS: 4 days   A & P  Sepsis secondary to streptococcal bacteremia -Patient was admitted with fever, tachycardia, acute kidney injury -Source of bacteremia is not entirely clear at this point -CT of chest, abdomen, pelvis did not show any obvious source of infection.  Urinalysis also unremarkable -He has not had any recent skin infections that he is aware of -Currently on ceftriaxone -He had 2 positive sets of blood cultures (both aerobic and anaerobic) positive for gram-positive cocci.  Further identification shows Streptococcus mitis/oralis -Repeat blood cultures drawn on 1/9, no growth as of yet -CT maxillofacial without signs of infection/abscess -MRI brain without any evidence of septic emboli -2D echo without signs of vegetations -cardiology requested for TEE, this will be performed on 1/13 -appreciate ID assistance -Antibiotics changed to penicillin G   Acute kidney injury -Secondary to sepsis -Renal function has improved with hydration   Paroxysmal atrial fibrillation -Anticoagulated with Xarelto -Chronically on metoprolol which was held on admission due to hypotension -Since blood pressures are improving, he was restarted on metoprolol at reduced dose    Aortic aneurysm status post Bentall procedure -He is status post aortoplasty and AVR with bioprosthetic valve -Last saw CVTS approximately 1 month ago   Recent COVID infection -Was diagnosed 12/19 and completed a course of molnupiravir -Overall respiratory symptoms have improved -He is outside the window of isolation.  Would not place on airborne precautions   Hypothyroidism -Continue thyroid replacement   Anxiety -Continue As needed benzodiazepines   Obstructive sleep apnea -Continue on CPAP   BPH -Continue Flomax   History of asthmatic bronchitis -Continue inhaled steroids and bronchodilators   Thrombocytopenia -Related to sepsis -Improving -Continue to monitor  I have seen and examined this patient myself. I have spent 34 minutes in his evaluation and care.   DVT prophylaxis:  rivaroxaban (  XARELTO) tablet 20 mg  Code Status: DNR Family Communication: Updated wife at the bedside Disposition Plan: Status is: Inpatient   Iveliz Garay, DO [facility] Direct contact: see [email or web address]  7PM-7AM contact night coverage as above 10/30/2021, 6:35 PM  LOS: 4 days

## 2021-10-31 DIAGNOSIS — A419 Sepsis, unspecified organism: Secondary | ICD-10-CM

## 2021-10-31 DIAGNOSIS — R7881 Bacteremia: Secondary | ICD-10-CM | POA: Diagnosis not present

## 2021-10-31 DIAGNOSIS — Z952 Presence of prosthetic heart valve: Secondary | ICD-10-CM | POA: Diagnosis not present

## 2021-10-31 DIAGNOSIS — R6520 Severe sepsis without septic shock: Secondary | ICD-10-CM

## 2021-10-31 DIAGNOSIS — U071 COVID-19: Secondary | ICD-10-CM | POA: Diagnosis not present

## 2021-10-31 DIAGNOSIS — B955 Unspecified streptococcus as the cause of diseases classified elsewhere: Secondary | ICD-10-CM

## 2021-10-31 DIAGNOSIS — N179 Acute kidney failure, unspecified: Secondary | ICD-10-CM

## 2021-10-31 MED ORDER — SODIUM CHLORIDE 0.9 % IV SOLN: INTRAVENOUS | Status: DC

## 2021-10-31 NOTE — Plan of Care (Signed)

## 2021-10-31 NOTE — Progress Notes (Signed)
Pt anxious, request Xanax, pt gets 3 times a day, 2nd dose administered. SRP, RN

## 2021-10-31 NOTE — Progress Notes (Signed)
PROGRESS NOTE  Grant [name] GUR:[id number] DOB: 1951-09-22 DOA: 10/26/2021 PCP: Shirline Frees, MD  Brief History   71 year old male with a history of atrial fibrillation on Xarelto, recent COVID infection, hypothyroidism, presented with worsening fatigue, decreased p.o. intake, fevers and shaking chills that began approximately 3 to 4 days prior to admission.  He was found to have streptococcal bacteremia.  Source of bacteremia is not entirely clear.  He has been started on IV antibiotics and admitted for further work-up.  Plan is for TEE tomorrow.  Consultants  Infectious disease  Procedures  None  Antibiotics   Anti-infectives (From admission, onward)    Start     Dose/Rate Route Frequency Ordered Stop   10/30/21 1000  penicillin G potassium 12 Million Units in dextrose 5 % 500 mL continuous infusion        12 Million Units 41.7 mL/hr over 12 Hours Intravenous Every 12 hours 10/29/21 1455     10/27/21 1400  cefTRIAXone (ROCEPHIN) 2 g in sodium chloride 0.9 % 100 mL IVPB  Status:  Discontinued        2 g 200 mL/hr over 30 Minutes Intravenous Every 24 hours 10/27/21 1150 10/29/21 1455   10/27/21 0400  vancomycin (VANCOREADY) IVPB 1250 mg/250 mL  Status:  Discontinued        1,250 mg 166.7 mL/hr over 90 Minutes Intravenous Every 24 hours 10/26/21 1234 10/26/21 1235   10/27/21 0400  vancomycin (VANCOREADY) IVPB 1250 mg/250 mL  Status:  Discontinued        1,250 mg 166.7 mL/hr over 90 Minutes Intravenous Every 24 hours 10/26/21 1235 10/27/21 1149   10/26/21 1800  metroNIDAZOLE (FLAGYL) IVPB 500 mg  Status:  Discontinued        500 mg 100 mL/hr over 60 Minutes Intravenous Every 12 hours 10/26/21 1159 10/27/21 1709   10/26/21 1800  ceFEPIme (MAXIPIME) 2 g in sodium chloride 0.9 % 100 mL IVPB  Status:  Discontinued        2 g 200 mL/hr over 30 Minutes Intravenous Every 12 hours 10/26/21 1221 10/27/21 1149   10/26/21 0630  vancomycin (VANCOCIN) IVPB 1000 mg/200 mL premix         1,000 mg 200 mL/hr over 60 Minutes Intravenous  Once 10/26/21 0533 10/26/21 0737   10/26/21 0530  ceFEPIme (MAXIPIME) 2 g in sodium chloride 0.9 % 100 mL IVPB        2 g 200 mL/hr over 30 Minutes Intravenous  Once 10/26/21 0517 10/26/21 0623   10/26/21 0530  metroNIDAZOLE (FLAGYL) IVPB 500 mg        500 mg 100 mL/hr over 60 Minutes Intravenous  Once 10/26/21 0517 10/26/21 0636   10/26/21 0530  vancomycin (VANCOCIN) IVPB 1000 mg/200 mL premix        1,000 mg 200 mL/hr over 60 Minutes Intravenous  Once 10/26/21 0517 10/26/21 0636      Subjective  The patient is resting quietly. No new complaints.   Objective   Vitals:  Vitals:   10/31/21 1311 10/31/21 1442  BP: 135/81   Pulse: 73   Resp: 18   Temp: 98.1 F (36.7 C)   SpO2: 95% 94%    Exam:  Constitutional:  The patient is awake, alert, and oriented x 3. No acute distress. Respiratory:  No increased work of breathing. No wheezes, rales, or rhonchi No tactile fremitus Cardiovascular:  Regular rate and rhythm No murmurs, ectopy, or gallups. No lateral PMI. No thrills. Abdomen:  Abdomen is  soft, non-tender, non-distended No hernias, masses, or organomegaly Normoactive bowel sounds.  Musculoskeletal:  No cyanosis, clubbing, or edema Skin:  No rashes, lesions, ulcers palpation of skin: no induration or nodules Neurologic:  CN 2-12 intact Sensation all 4 extremities intact Psychiatric:  Mental status Mood, affect appropriate Orientation to person, place, time  judgment and insight appear intact  I have personally reviewed the following:   Today's Data  Vitals  Lab Data  BMP CBC  Micro Data  Blood culture x 2: streptococcus mitis/oralis Urine culture: Insignificant growth  Imaging  CXR Maxillofacial CT MR brain without contrast  Cardiology Data  EKG Echocardiogram  Other Data    Scheduled Meds:  finasteride  5 mg Oral QHS   guaiFENesin  600 mg Oral BID   levothyroxine  112 mcg Oral QAC  breakfast   liothyronine  25 mcg Oral QAC breakfast   metoprolol tartrate  12.5 mg Oral BID   mometasone-formoterol  2 puff Inhalation BID   pantoprazole  40 mg Oral BID   psyllium  1 packet Oral Daily   rivaroxaban  20 mg Oral Q supper   tamsulosin  0.4 mg Oral BID   venlafaxine XR  150 mg Oral Q breakfast   zolpidem  5 mg Oral QHS   Continuous Infusions:  penicillin g continuous IV infusion 12 Million Units (10/31/21 0843)    Principal Problem:   Streptococcal bacteremia Active Problems:   Obstructive sleep apnea   PAF (paroxysmal atrial fibrillation) (HCC)   Chronic anticoagulation   S/P AVR (aortic valve replacement) and aortoplasty   Sepsis (HCC)   BPH (benign prostatic hyperplasia)   AKI (acute kidney injury) ([place])   COVID-19   LOS: 5 days   A & P  Sepsis secondary to streptococcal bacteremia -Patient was admitted with fever, tachycardia, acute kidney injury -Source of bacteremia is not entirely clear at this point -CT of chest, abdomen, pelvis did not show any obvious source of infection.  Urinalysis also unremarkable -He has not had any recent skin infections that he is aware of -Currently on ceftriaxone -He had 2 positive sets of blood cultures (both aerobic and anaerobic) positive for gram-positive cocci.  Further identification shows Streptococcus mitis/oralis -Repeat blood cultures drawn on 1/9, no growth as of yet -CT maxillofacial without signs of infection/abscess -MRI brain without any evidence of septic emboli -2D echo without signs of vegetations -cardiology requested for TEE, this will be performed tomorrow -appreciate ID assistance -Antibiotics changed to penicillin G   Acute kidney injury -Secondary to sepsis -Renal function has improved with hydration   Paroxysmal atrial fibrillation -Anticoagulated with Xarelto -Chronically on metoprolol which was held on admission due to hypotension -Since blood pressures are improving, he was restarted on  metoprolol at reduced dose   Aortic aneurysm status post Bentall procedure -He is status post aortoplasty and AVR with bioprosthetic valve -Last saw CVTS approximately 1 month ago   Recent COVID infection -Was diagnosed 12/19 and completed a course of molnupiravir -Overall respiratory symptoms have improved -He is outside the window of isolation.  Would not place on airborne precautions   Hypothyroidism -Continue thyroid replacement   Anxiety -Continue As needed benzodiazepines   Obstructive sleep apnea -Continue on CPAP   BPH -Continue Flomax   History of asthmatic bronchitis -Continue inhaled steroids and bronchodilators   Thrombocytopenia -Related to sepsis -Improving -Continue to monitor  I have seen and examined this patient myself. I have spent 32 minutes in his evaluation and care.  DVT prophylaxis:  rivaroxaban (XARELTO) tablet 20 mg  Code Status: DNR Family Communication: Updated wife at the bedside Disposition Plan: Status is: Inpatient   Djon Tith, DO [facility] Direct contact: see [email or web address]  7PM-7AM contact night coverage as above 10/31/2021, 4:51 PM  LOS: 4 days

## 2021-10-31 NOTE — Progress Notes (Signed)
° ° °  [facility] has been requested to perform a transesophageal echocardiogram on 1/13 for Bacteremia.  After careful review of history and examination, the risks and benefits of transesophageal echocardiogram have been explained including risks of esophageal damage, perforation (1:10,000 risk), bleeding, pharyngeal hematoma as well as other potential complications associated with conscious sedation including aspiration, arrhythmia, respiratory failure and death. Alternatives to treatment were discussed, questions were answered. Patient is willing to proceed. This was discussed with the patient and wife over the phone with 2 patient identifiers. Wife's number: [phone]  Reino Bellis, NP-C 10/31/2021 3:58 PM

## 2021-10-31 NOTE — Progress Notes (Signed)
[facility] for Infectious Disease  Date of Admission:  10/26/2021           Reason for visit: Follow up on strep mitis/oralis bacteremia  Current antibiotics: PCN G 1/11- present   Previous antibiotics: Cefepime 1/7-1/8 Metronidazole 1/7/1/8 Vancomycin 1/7-1/8 Ceftriaxone 1/8-1/10   ASSESSMENT:    71 y.o. male admitted with:  Streptococcus mitis/oralis bacteremia: Suspected odontogenic source of infection given recent dental procedures.  CT maxillofacial was negative for destructive osseous lesions and no noncontrast evidence of abscess or phlegmon was seen.  He has also undergone imaging of the chest, abdomen, pelvis with CT scan that was unrevealing and MRI brain did not show any evidence of CNS septic emboli.  TTE was negative for vegetation and TEE is scheduled for tomorrow to further evaluate for prosthetic valve endocarditis. 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 Sepsis: Secondary to #1.  Resolved. Acute kidney injury: Patient presented with creatinine 1.61.  This has improved.  Creatinine yesterday was 0.99. Thrombocytopenia: Likely related to sepsis and has also improved. Recent COVID infection: Diagnosed 10/07/2021 and received a course of antivirals.   RECOMMENDATIONS:    Continue penicillin G IV Follow-up repeat blood cultures which are no growth to date Await TEE Will follow   Principal Problem:   Streptococcal bacteremia Active Problems:   Obstructive sleep apnea   PAF (paroxysmal atrial fibrillation) (HCC)   Chronic anticoagulation   S/P AVR (aortic valve replacement) and aortoplasty   Sepsis (HCC)   BPH (benign prostatic hyperplasia)   AKI (acute kidney injury) ([place])   COVID-19    MEDICATIONS:    Scheduled Meds:  finasteride  5 mg Oral QHS   guaiFENesin  600 mg Oral BID   levothyroxine  112 mcg Oral QAC breakfast   liothyronine  25  mcg Oral QAC breakfast   metoprolol tartrate  12.5 mg Oral BID   mometasone-formoterol  2 puff Inhalation BID   pantoprazole  40 mg Oral BID   psyllium  1 packet Oral Daily   rivaroxaban  20 mg Oral Q supper   tamsulosin  0.4 mg Oral BID   venlafaxine XR  150 mg Oral Q breakfast   zolpidem  5 mg Oral QHS   Continuous Infusions:  penicillin g continuous IV infusion 12 Million Units (10/30/21 2232)   PRN Meds:.acetaminophen **OR** acetaminophen, albuterol, albuterol, ALPRAZolam, azelastine, benzonatate, diphenhydrAMINE, ondansetron **OR** ondansetron (ZOFRAN) IV, rizatriptan, rizatriptan, sodium chloride  SUBJECTIVE:   24 hour events:  No acute events noted overnight Afebrile T-max 98.6 Oxygen saturation stable on room air No new labs this morning 10/28/2021 repeat blood cultures no growth to date No new imaging TEE planned for tomorrow   No new complaints this morning.  He is not accompanied by his wife at this time.  He reports he had a shower yesterday and is planning to do so again today.  He is undecided if he will be up and working with therapy at this time.  He seems to understand plan for TEE scheduled for tomorrow.  Review of Systems  All other systems reviewed and are negative.    OBJECTIVE:   Blood pressure (!) 161/94, pulse 80, temperature 98.6 F (37 C), temperature source Oral, resp. rate 20, height 5\' 10"  (1.778 m), weight 108.9 kg, SpO2 94 %. Body mass index is 34.44 kg/m.  Physical Exam Constitutional:      General: He  is not in acute distress.    Appearance: Normal appearance.  HENT:     Head: Normocephalic and atraumatic.  Eyes:     Extraocular Movements: Extraocular movements intact.     Conjunctiva/sclera: Conjunctivae normal.  Pulmonary:     Effort: Pulmonary effort is normal. No respiratory distress.  Abdominal:     General: There is no distension.     Palpations: Abdomen is soft.     Tenderness: There is no abdominal tenderness.   Musculoskeletal:        General: Normal range of motion.     Cervical back: Normal range of motion and neck supple.  Skin:    General: Skin is warm and dry.     Findings: No rash.  Neurological:     General: No focal deficit present.     Mental Status: He is alert. Mental status is at baseline.  Psychiatric:        Mood and Affect: Mood normal.        Behavior: Behavior normal.     Lab Results: Lab Results  Component Value Date   WBC 10.1 10/30/2021   HGB 12.9 (L) 10/30/2021   HCT 39.6 10/30/2021   MCV 97.3 10/30/2021   PLT 147 (L) 10/30/2021    Lab Results  Component Value Date   NA 136 10/30/2021   K 4.2 10/30/2021   CO2 26 10/30/2021   GLUCOSE 107 (H) 10/30/2021   BUN 11 10/30/2021   CREATININE 0.99 10/30/2021   CALCIUM 8.6 (L) 10/30/2021   GFRNONAA >60 10/30/2021   GFRAA >60 02/16/2019    Lab Results  Component Value Date   ALT 21 10/27/2021   AST 28 10/27/2021   ALKPHOS 35 (L) 10/27/2021   BILITOT 0.5 10/27/2021       Component Value Date/Time   CRP 5.6 (H) 10/13/2020 0550       Component Value Date/Time   ESRSEDRATE 6 03/08/2018 0020     I have reviewed the micro and lab results in Epic.  Imaging: No results found.   Imaging independently reviewed in Epic.    Raynelle Highland for Infectious [facility] [phone] pager 10/31/2021, 8:26 AM

## 2021-10-31 NOTE — H&P (View-Only) (Signed)
PROGRESS NOTE  Grant [name] DVV:[id number] DOB: 07/20/1951 DOA: 10/26/2021 PCP: Shirline Frees, MD  Brief History   71 year old male with a history of atrial fibrillation on Xarelto, recent COVID infection, hypothyroidism, presented with worsening fatigue, decreased p.o. intake, fevers and shaking chills that began approximately 3 to 4 days prior to admission.  He was found to have streptococcal bacteremia.  Source of bacteremia is not entirely clear.  He has been started on IV antibiotics and admitted for further work-up.  Plan is for TEE tomorrow.  Consultants  Infectious disease  Procedures  None  Antibiotics   Anti-infectives (From admission, onward)    Start     Dose/Rate Route Frequency Ordered Stop   10/30/21 1000  penicillin G potassium 12 Million Units in dextrose 5 % 500 mL continuous infusion        12 Million Units 41.7 mL/hr over 12 Hours Intravenous Every 12 hours 10/29/21 1455     10/27/21 1400  cefTRIAXone (ROCEPHIN) 2 g in sodium chloride 0.9 % 100 mL IVPB  Status:  Discontinued        2 g 200 mL/hr over 30 Minutes Intravenous Every 24 hours 10/27/21 1150 10/29/21 1455   10/27/21 0400  vancomycin (VANCOREADY) IVPB 1250 mg/250 mL  Status:  Discontinued        1,250 mg 166.7 mL/hr over 90 Minutes Intravenous Every 24 hours 10/26/21 1234 10/26/21 1235   10/27/21 0400  vancomycin (VANCOREADY) IVPB 1250 mg/250 mL  Status:  Discontinued        1,250 mg 166.7 mL/hr over 90 Minutes Intravenous Every 24 hours 10/26/21 1235 10/27/21 1149   10/26/21 1800  metroNIDAZOLE (FLAGYL) IVPB 500 mg  Status:  Discontinued        500 mg 100 mL/hr over 60 Minutes Intravenous Every 12 hours 10/26/21 1159 10/27/21 1709   10/26/21 1800  ceFEPIme (MAXIPIME) 2 g in sodium chloride 0.9 % 100 mL IVPB  Status:  Discontinued        2 g 200 mL/hr over 30 Minutes Intravenous Every 12 hours 10/26/21 1221 10/27/21 1149   10/26/21 0630  vancomycin (VANCOCIN) IVPB 1000 mg/200 mL premix         1,000 mg 200 mL/hr over 60 Minutes Intravenous  Once 10/26/21 0533 10/26/21 0737   10/26/21 0530  ceFEPIme (MAXIPIME) 2 g in sodium chloride 0.9 % 100 mL IVPB        2 g 200 mL/hr over 30 Minutes Intravenous  Once 10/26/21 0517 10/26/21 0623   10/26/21 0530  metroNIDAZOLE (FLAGYL) IVPB 500 mg        500 mg 100 mL/hr over 60 Minutes Intravenous  Once 10/26/21 0517 10/26/21 0636   10/26/21 0530  vancomycin (VANCOCIN) IVPB 1000 mg/200 mL premix        1,000 mg 200 mL/hr over 60 Minutes Intravenous  Once 10/26/21 0517 10/26/21 0636      Subjective  The patient is resting quietly. No new complaints.   Objective   Vitals:  Vitals:   10/31/21 1311 10/31/21 1442  BP: 135/81   Pulse: 73   Resp: 18   Temp: 98.1 F (36.7 C)   SpO2: 95% 94%    Exam:  Constitutional:  The patient is awake, alert, and oriented x 3. No acute distress. Respiratory:  No increased work of breathing. No wheezes, rales, or rhonchi No tactile fremitus Cardiovascular:  Regular rate and rhythm No murmurs, ectopy, or gallups. No lateral PMI. No thrills. Abdomen:  Abdomen is  soft, non-tender, non-distended No hernias, masses, or organomegaly Normoactive bowel sounds.  Musculoskeletal:  No cyanosis, clubbing, or edema Skin:  No rashes, lesions, ulcers palpation of skin: no induration or nodules Neurologic:  CN 2-12 intact Sensation all 4 extremities intact Psychiatric:  Mental status Mood, affect appropriate Orientation to person, place, time  judgment and insight appear intact  I have personally reviewed the following:   Today's Data  Vitals  Lab Data  BMP CBC  Micro Data  Blood culture x 2: streptococcus mitis/oralis Urine culture: Insignificant growth  Imaging  CXR Maxillofacial CT MR brain without contrast  Cardiology Data  EKG Echocardiogram  Other Data    Scheduled Meds:  finasteride  5 mg Oral QHS   guaiFENesin  600 mg Oral BID   levothyroxine  112 mcg Oral QAC  breakfast   liothyronine  25 mcg Oral QAC breakfast   metoprolol tartrate  12.5 mg Oral BID   mometasone-formoterol  2 puff Inhalation BID   pantoprazole  40 mg Oral BID   psyllium  1 packet Oral Daily   rivaroxaban  20 mg Oral Q supper   tamsulosin  0.4 mg Oral BID   venlafaxine XR  150 mg Oral Q breakfast   zolpidem  5 mg Oral QHS   Continuous Infusions:  penicillin g continuous IV infusion 12 Million Units (10/31/21 0843)    Principal Problem:   Streptococcal bacteremia Active Problems:   Obstructive sleep apnea   PAF (paroxysmal atrial fibrillation) (HCC)   Chronic anticoagulation   S/P AVR (aortic valve replacement) and aortoplasty   Sepsis (HCC)   BPH (benign prostatic hyperplasia)   AKI (acute kidney injury) ([place])   COVID-19   LOS: 5 days   A & P  Sepsis secondary to streptococcal bacteremia -Patient was admitted with fever, tachycardia, acute kidney injury -Source of bacteremia is not entirely clear at this point -CT of chest, abdomen, pelvis did not show any obvious source of infection.  Urinalysis also unremarkable -He has not had any recent skin infections that he is aware of -Currently on ceftriaxone -He had 2 positive sets of blood cultures (both aerobic and anaerobic) positive for gram-positive cocci.  Further identification shows Streptococcus mitis/oralis -Repeat blood cultures drawn on 1/9, no growth as of yet -CT maxillofacial without signs of infection/abscess -MRI brain without any evidence of septic emboli -2D echo without signs of vegetations -cardiology requested for TEE, this will be performed tomorrow -appreciate ID assistance -Antibiotics changed to penicillin G   Acute kidney injury -Secondary to sepsis -Renal function has improved with hydration   Paroxysmal atrial fibrillation -Anticoagulated with Xarelto -Chronically on metoprolol which was held on admission due to hypotension -Since blood pressures are improving, he was restarted on  metoprolol at reduced dose   Aortic aneurysm status post Bentall procedure -He is status post aortoplasty and AVR with bioprosthetic valve -Last saw CVTS approximately 1 month ago   Recent COVID infection -Was diagnosed 12/19 and completed a course of molnupiravir -Overall respiratory symptoms have improved -He is outside the window of isolation.  Would not place on airborne precautions   Hypothyroidism -Continue thyroid replacement   Anxiety -Continue As needed benzodiazepines   Obstructive sleep apnea -Continue on CPAP   BPH -Continue Flomax   History of asthmatic bronchitis -Continue inhaled steroids and bronchodilators   Thrombocytopenia -Related to sepsis -Improving -Continue to monitor  I have seen and examined this patient myself. I have spent 32 minutes in his evaluation and care.  DVT prophylaxis:  rivaroxaban (XARELTO) tablet 20 mg  Code Status: DNR Family Communication: Updated wife at the bedside Disposition Plan: Status is: Inpatient   Franklin Baumbach, DO [facility] Direct contact: see [email or web address]  7PM-7AM contact night coverage as above 10/31/2021, 4:51 PM  LOS: 4 days

## 2021-10-31 NOTE — Care Management Important Message (Signed)
Important Message  Patient Details IM Letter given to the Patient. Name: Grant [name] MRN: [id number] Date of Birth: [birth year]   Medicare Important Message Given:  Yes     Kerin Salen 10/31/2021, 12:26 PM

## 2021-11-01 ENCOUNTER — Inpatient Hospital Stay (HOSPITAL_COMMUNITY): Payer: Medicare Other | Admitting: Certified Registered"

## 2021-11-01 ENCOUNTER — Inpatient Hospital Stay (HOSPITAL_COMMUNITY): Payer: Medicare Other

## 2021-11-01 ENCOUNTER — Encounter (HOSPITAL_COMMUNITY): Payer: Self-pay | Admitting: Internal Medicine

## 2021-11-01 ENCOUNTER — Encounter (HOSPITAL_COMMUNITY)
Admission: EM | Disposition: A | Discharge status: Home / Self Care | Payer: Self-pay | Source: Home / Self Care | Attending: Internal Medicine

## 2021-11-01 DIAGNOSIS — B955 Unspecified streptococcus as the cause of diseases classified elsewhere: Secondary | ICD-10-CM | POA: Diagnosis not present

## 2021-11-01 DIAGNOSIS — I340 Nonrheumatic mitral (valve) insufficiency: Secondary | ICD-10-CM | POA: Diagnosis not present

## 2021-11-01 DIAGNOSIS — R7881 Bacteremia: Secondary | ICD-10-CM | POA: Diagnosis not present

## 2021-11-01 DIAGNOSIS — I2510 Atherosclerotic heart disease of native coronary artery without angina pectoris: Secondary | ICD-10-CM

## 2021-11-01 HISTORY — PX: TEE WITHOUT CARDIOVERSION: SHX5443

## 2021-11-01 HISTORY — PX: BUBBLE STUDY: SHX6837

## 2021-11-01 LAB — ECHO TEE
AR max vel: 1.65 cm2
AV Area VTI: 1.72 cm2
AV Area mean vel: 1.65 cm2
AV Mean grad: 17 mmHg
AV Peak grad: 29.8 mmHg
Ao pk vel: 2.73 m/s

## 2021-11-01 SURGERY — ECHOCARDIOGRAM, TRANSESOPHAGEAL
Anesthesia: Monitor Anesthesia Care

## 2021-11-01 MED ORDER — DEXMEDETOMIDINE (PRECEDEX) IN NS 20 MCG/5ML (4 MCG/ML) IV SYRINGE
PREFILLED_SYRINGE | INTRAVENOUS | Status: DC | PRN
  Administered 2021-11-01: 12 ug via INTRAVENOUS

## 2021-11-01 MED ORDER — PROPOFOL 10 MG/ML IV BOLUS
INTRAVENOUS | Status: DC | PRN
  Administered 2021-11-01: 60 mg via INTRAVENOUS

## 2021-11-01 MED ORDER — LIDOCAINE 2% (20 MG/ML) 5 ML SYRINGE
INTRAMUSCULAR | Status: DC | PRN
  Administered 2021-11-01: 100 mg via INTRAVENOUS

## 2021-11-01 MED ORDER — SODIUM CHLORIDE 0.9 % IV SOLN
INTRAVENOUS | Status: AC | PRN
  Administered 2021-11-01: 500 mL via INTRAVENOUS

## 2021-11-01 MED ORDER — NITROGLYCERIN 0.4 MG SL SUBL
0.8000 mg | SUBLINGUAL_TABLET | Freq: Once | SUBLINGUAL | Status: AC
  Administered 2021-11-01: 0.8 mg via SUBLINGUAL

## 2021-11-01 MED ORDER — IOHEXOL 350 MG/ML SOLN
100.0000 mL | Freq: Once | INTRAVENOUS | Status: AC | PRN
  Administered 2021-11-01: 100 mL via INTRAVENOUS

## 2021-11-01 MED ORDER — PROPOFOL 500 MG/50ML IV EMUL
INTRAVENOUS | Status: DC | PRN
  Administered 2021-11-01: 100 ug/kg/min via INTRAVENOUS

## 2021-11-01 MED ORDER — SODIUM CHLORIDE 0.9 % IV SOLN: INTRAVENOUS | Status: DC | PRN

## 2021-11-01 NOTE — Plan of Care (Signed)

## 2021-11-01 NOTE — Progress Notes (Signed)
°  Echocardiogram Echocardiogram Transesophageal has been performed.  Grant [name] 11/01/2021, 2:53 PM

## 2021-11-01 NOTE — Transfer of Care (Signed)
Immediate Anesthesia Transfer of Care Note  Patient: Grant [name]  Procedure(s) Performed: TRANSESOPHAGEAL ECHOCARDIOGRAM (TEE) BUBBLE STUDY  Patient Location: PACU  Anesthesia Type:MAC  Level of Consciousness: drowsy and patient cooperative  Airway & Oxygen Therapy: Patient Spontanous Breathing and Patient connected to nasal cannula oxygen  Post-op Assessment: Report given to RN and Post -op Vital signs reviewed and stable  Post vital signs: Reviewed and stable  Last Vitals:  Vitals Value Taken Time  BP 91/70 11/01/21 1436  Temp 36.5 C 11/01/21 1435  Pulse 81 11/01/21 1438  Resp 16 11/01/21 1438  SpO2 99 % 11/01/21 1438  Vitals shown include unvalidated device data.  Last Pain:  Vitals:   11/01/21 1435  TempSrc: Temporal  PainSc: 0-No pain      Patients Stated Pain Goal: 3 (94/49/67 5916)  Complications: No notable events documented.

## 2021-11-01 NOTE — Progress Notes (Signed)
Pt transport to [facility] for TEE, [facility] transport team. SRP,RN

## 2021-11-01 NOTE — Interval H&P Note (Signed)
History and Physical Interval Note:  11/01/2021 1:39 PM  Grant [name]  has presented today for surgery, with the diagnosis of BACTEREMIA.  The various methods of treatment have been discussed with the patient and family. After consideration of risks, benefits and other options for treatment, the patient has consented to  Procedure(s): TRANSESOPHAGEAL ECHOCARDIOGRAM (TEE) (N/A) as a surgical intervention.  The patient's history has been reviewed, patient examined, no change in status, stable for surgery.  I have reviewed the patient's chart and labs.  Questions were answered to the patient's satisfaction.     NPO for TEE for bacteremia. Prosthetic aortic valve.   [place] T. Audie Box, MD, [place]  [address], [place], [place] [address] [phone]  1:39 PM

## 2021-11-01 NOTE — Anesthesia Preprocedure Evaluation (Signed)
Anesthesia Evaluation  Patient identified by MRN, date of birth, ID band Patient awake    Reviewed: Allergy & Precautions, H&P , NPO status , Patient's Chart, lab work & pertinent test results  Airway Mallampati: II   Neck ROM: full    Dental   Pulmonary shortness of breath, asthma , sleep apnea , former smoker,    breath sounds clear to auscultation       Cardiovascular hypertension, + dysrhythmias Atrial Fibrillation + Valvular Problems/Murmurs  Rhythm:regular Rate:Normal  S/p Bentall procedure 2018   Neuro/Psych  Headaches, PSYCHIATRIC DISORDERS Anxiety Depression  Neuromuscular disease    GI/Hepatic GERD  ,  Endo/Other  Hypothyroidism   Renal/GU      Musculoskeletal  (+) Arthritis , Fibromyalgia -  Abdominal   Peds  Hematology   Anesthesia Other Findings   Reproductive/Obstetrics                             Anesthesia Physical Anesthesia Plan  ASA: 3  Anesthesia Plan: MAC   Post-op Pain Management:    Induction: Intravenous  PONV Risk Score and Plan: 2 and Ondansetron, Propofol infusion and Treatment may vary due to age or medical condition  Airway Management Planned: Nasal Cannula  Additional Equipment:   Intra-op Plan:   Post-operative Plan:   Informed Consent: I have reviewed the patients History and Physical, chart, labs and discussed the procedure including the risks, benefits and alternatives for the proposed anesthesia with the patient or authorized representative who has indicated his/her understanding and acceptance.     Dental advisory given  Plan Discussed with: CRNA, Anesthesiologist and Surgeon  Anesthesia Plan Comments:         Anesthesia Quick Evaluation

## 2021-11-01 NOTE — Anesthesia Postprocedure Evaluation (Signed)
Anesthesia Post Note  Patient: Grant [name]  Procedure(s) Performed: TRANSESOPHAGEAL ECHOCARDIOGRAM (TEE) BUBBLE STUDY     Patient location during evaluation: Endoscopy Anesthesia Type: MAC Level of consciousness: awake and alert Pain management: pain level controlled Vital Signs Assessment: post-procedure vital signs reviewed and stable Respiratory status: spontaneous breathing, nonlabored ventilation, respiratory function stable and patient connected to nasal cannula oxygen Cardiovascular status: stable and blood pressure returned to baseline Postop Assessment: no apparent nausea or vomiting Anesthetic complications: no   No notable events documented.  Last Vitals:  Vitals:   11/01/21 1445 11/01/21 1455  BP: 119/71 131/75  Pulse: 73 73  Resp: 19 16  Temp:    SpO2: 97% 96%    Last Pain:  Vitals:   11/01/21 1455  TempSrc:   PainSc: 0-No pain                 Danton Palmateer S

## 2021-11-01 NOTE — CV Procedure (Signed)
° ° °  TRANSESOPHAGEAL ECHOCARDIOGRAM   NAME:  Grant [name]    MRN: [id number] DOB:  [birth year]    ADMIT DATE: 10/26/2021  INDICATIONS: Bacteremia  PROCEDURE:   Informed consent was obtained prior to the procedure. The risks, benefits and alternatives for the procedure were discussed and the patient comprehended these risks.  Risks include, but are not limited to, cough, sore throat, vomiting, nausea, somnolence, esophageal and stomach trauma or perforation, bleeding, low blood pressure, aspiration, pneumonia, infection, trauma to the teeth and death.    Procedural time out performed. The oropharynx was anesthetized with topical 1% benzocaine.    Anesthesia was administered by Dr. Marcie Bal.  The patient was administered 320 mg of propofol and 100 mg of lidocaine to achieve and maintain moderate conscious sedation.  The patient's heart rate, blood pressure, and oxygen saturation are monitored continuously during the procedure. The period of conscious sedation is 20 minutes, of which I was present face-to-face 100% of this time.   The transesophageal probe was inserted in the esophagus and stomach without difficulty and multiple views were obtained.   COMPLICATIONS:    There were no immediate complications.  KEY FINDINGS:  Thickened aortic root noted. Cannot exclude aortic root abscess. Cardiac CT recommended.  No valvular vegetations.  Full report to follow. Further management per primary team.   [place] Bells T. Audie Box, MD, [place]  [address] [place], [place] [address] [phone]  2:24 PM

## 2021-11-01 NOTE — Progress Notes (Signed)
PROGRESS NOTE  Grant [name] JSH:[id number] DOB: [birth year] DOA: 10/26/2021 PCP: Shirline Frees, MD  Brief History   71 year old male with a history of atrial fibrillation on Xarelto, recent COVID infection, hypothyroidism, presented with worsening fatigue, decreased p.o. intake, fevers and shaking chills that began approximately 3 to 4 days prior to admission.  He was found to have streptococcal bacteremia.  Source of bacteremia is not entirely clear.  He has been started on IV antibiotics and admitted for further work-up.  Pt went to [facility] today for TEE. Dr. Debbe Mounts note mentions a thickened aortic root raising concern for an aortic root abscess. Cardiac CT has been ordered. There were no valvular vegetations.  Consultants  Infectious disease Cardiology  Procedures  None  Antibiotics   Anti-infectives (From admission, onward)    Start     Dose/Rate Route Frequency Ordered Stop   10/30/21 1000  [MAR Hold]  penicillin G potassium 12 Million Units in dextrose 5 % 500 mL continuous infusion        (MAR Hold since Fri 11/01/2021 at 1336.Hold Reason: Transfer to a Procedural area)   12 Million Units 41.7 mL/hr over 12 Hours Intravenous Every 12 hours 10/29/21 1455     10/27/21 1400  cefTRIAXone (ROCEPHIN) 2 g in sodium chloride 0.9 % 100 mL IVPB  Status:  Discontinued        2 g 200 mL/hr over 30 Minutes Intravenous Every 24 hours 10/27/21 1150 10/29/21 1455   10/27/21 0400  vancomycin (VANCOREADY) IVPB 1250 mg/250 mL  Status:  Discontinued        1,250 mg 166.7 mL/hr over 90 Minutes Intravenous Every 24 hours 10/26/21 1234 10/26/21 1235   10/27/21 0400  vancomycin (VANCOREADY) IVPB 1250 mg/250 mL  Status:  Discontinued        1,250 mg 166.7 mL/hr over 90 Minutes Intravenous Every 24 hours 10/26/21 1235 10/27/21 1149   10/26/21 1800  metroNIDAZOLE (FLAGYL) IVPB 500 mg  Status:  Discontinued        500 mg 100 mL/hr over 60 Minutes Intravenous Every 12 hours 10/26/21 1159 10/27/21 1709    10/26/21 1800  ceFEPIme (MAXIPIME) 2 g in sodium chloride 0.9 % 100 mL IVPB  Status:  Discontinued        2 g 200 mL/hr over 30 Minutes Intravenous Every 12 hours 10/26/21 1221 10/27/21 1149   10/26/21 0630  vancomycin (VANCOCIN) IVPB 1000 mg/200 mL premix        1,000 mg 200 mL/hr over 60 Minutes Intravenous  Once 10/26/21 0533 10/26/21 0737   10/26/21 0530  ceFEPIme (MAXIPIME) 2 g in sodium chloride 0.9 % 100 mL IVPB        2 g 200 mL/hr over 30 Minutes Intravenous  Once 10/26/21 0517 10/26/21 0623   10/26/21 0530  metroNIDAZOLE (FLAGYL) IVPB 500 mg        500 mg 100 mL/hr over 60 Minutes Intravenous  Once 10/26/21 0517 10/26/21 0636   10/26/21 0530  vancomycin (VANCOCIN) IVPB 1000 mg/200 mL premix        1,000 mg 200 mL/hr over 60 Minutes Intravenous  Once 10/26/21 0517 10/26/21 0636      Subjective  The patient is resting quietly. No new complaints.   Objective   Vitals:  Vitals:   11/01/21 1445 11/01/21 1455  BP: 119/71 131/75  Pulse: 73 73  Resp: 19 16  Temp:    SpO2: 97% 96%    Exam:  Constitutional:  The patient is awake,  alert, and oriented x 3. No acute distress. Respiratory:  No increased work of breathing. No wheezes, rales, or rhonchi No tactile fremitus Cardiovascular:  Regular rate and rhythm No murmurs, ectopy, or gallups. No lateral PMI. No thrills. Abdomen:  Abdomen is soft, non-tender, non-distended No hernias, masses, or organomegaly Normoactive bowel sounds.  Musculoskeletal:  No cyanosis, clubbing, or edema Skin:  No rashes, lesions, ulcers palpation of skin: no induration or nodules Neurologic:  CN 2-12 intact Sensation all 4 extremities intact Psychiatric:  Mental status Mood, affect appropriate Orientation to person, place, time  judgment and insight appear intact  I have personally reviewed the following:   Today's Data  Vitals  Lab Data  BMP CBC  Micro Data  Blood culture x 2: streptococcus mitis/oralis Urine  culture: Insignificant growth  Imaging  CXR Maxillofacial CT MR brain without contrast Cardiac CT ordered  Cardiology Data  EKG Echocardiogram TEE  Other Data    Scheduled Meds:  [MAR Hold] finasteride  5 mg Oral QHS   [MAR Hold] guaiFENesin  600 mg Oral BID   [MAR Hold] levothyroxine  112 mcg Oral QAC breakfast   [MAR Hold] liothyronine  25 mcg Oral QAC breakfast   [MAR Hold] metoprolol tartrate  12.5 mg Oral BID   [MAR Hold] mometasone-formoterol  2 puff Inhalation BID   [MAR Hold] pantoprazole  40 mg Oral BID   [MAR Hold] psyllium  1 packet Oral Daily   [MAR Hold] rivaroxaban  20 mg Oral Q supper   [MAR Hold] tamsulosin  0.4 mg Oral BID   [MAR Hold] venlafaxine XR  150 mg Oral Q breakfast   [MAR Hold] zolpidem  5 mg Oral QHS   Continuous Infusions:  sodium chloride 20 mL/hr at 10/31/21 2006   [facility] Hold] penicillin g continuous IV infusion 12 Million Units (11/01/21 0858)    Principal Problem:   Streptococcal bacteremia Active Problems:   Obstructive sleep apnea   PAF (paroxysmal atrial fibrillation) (HCC)   Chronic anticoagulation   S/P AVR (aortic valve replacement) and aortoplasty   Sepsis (HCC)   BPH (benign prostatic hyperplasia)   AKI (acute kidney injury) ([place])   COVID-19   LOS: 6 days   A & P  Sepsis secondary to streptococcal bacteremia -Patient was admitted with fever, tachycardia, acute kidney injury -Source of bacteremia is not entirely clear at this point -CT of chest, abdomen, pelvis did not show any obvious source of infection.  Urinalysis also unremarkable -He has not had any recent skin infections that he is aware of -Currently on ceftriaxone -He had 2 positive sets of blood cultures (both aerobic and anaerobic) positive for gram-positive cocci.  Further identification shows Streptococcus mitis/oralis -Repeat blood cultures drawn on 1/9, no growth as of yet -CT maxillofacial without signs of infection/abscess -MRI brain without any  evidence of septic emboli -2D echo without signs of vegetations -cardiology requested for TEE, this will be performed tomorrow -appreciate ID assistance -Antibiotics changed to penicillin G -TEE demonstrated a thickened aortic root that raises concerns for an abscess of the aortic root. Cardiac CT ordered.   Acute kidney injury -Secondary to sepsis -Renal function has improved with hydration   Paroxysmal atrial fibrillation -Anticoagulated with Xarelto -Chronically on metoprolol which was held on admission due to hypotension -Since blood pressures are improving, he was restarted on metoprolol at reduced dose   Aortic aneurysm status post Bentall procedure -He is status post aortoplasty and AVR with bioprosthetic valve -Last saw CVTS approximately 1 month  ago   Recent COVID infection -Was diagnosed 12/19 and completed a course of molnupiravir -Overall respiratory symptoms have improved -He is outside the window of isolation.  Would not place on airborne precautions   Hypothyroidism -Continue thyroid replacement   Anxiety -Continue As needed benzodiazepines   Obstructive sleep apnea -Continue on CPAP   BPH -Continue Flomax   History of asthmatic bronchitis -Continue inhaled steroids and bronchodilators   Thrombocytopenia -Related to sepsis -Improving -Continue to monitor  I have seen and examined this patient myself. I have spent 34 minutes in his evaluation and care.   DVT prophylaxis:  rivaroxaban (XARELTO) tablet 20 mg  Code Status: DNR Family Communication: Updated wife at the bedside Disposition Plan: Status is: Inpatient   Latera Mclin, DO [facility] Direct contact: see [email or web address]  7PM-7AM contact night coverage as above 11/01/2021, 3:27 PM  LOS: 4 days

## 2021-11-02 DIAGNOSIS — N179 Acute kidney failure, unspecified: Secondary | ICD-10-CM | POA: Diagnosis not present

## 2021-11-02 DIAGNOSIS — U071 COVID-19: Secondary | ICD-10-CM | POA: Diagnosis not present

## 2021-11-02 DIAGNOSIS — R7881 Bacteremia: Secondary | ICD-10-CM | POA: Diagnosis not present

## 2021-11-02 DIAGNOSIS — B955 Unspecified streptococcus as the cause of diseases classified elsewhere: Secondary | ICD-10-CM | POA: Diagnosis not present

## 2021-11-02 DIAGNOSIS — Z952 Presence of prosthetic heart valve: Secondary | ICD-10-CM | POA: Diagnosis not present

## 2021-11-02 DIAGNOSIS — A419 Sepsis, unspecified organism: Secondary | ICD-10-CM | POA: Diagnosis not present

## 2021-11-02 LAB — CULTURE, BLOOD (ROUTINE X 2)
Culture: NO GROWTH
Culture: NO GROWTH
Special Requests: ADEQUATE
Special Requests: ADEQUATE

## 2021-11-02 MED ORDER — SUMATRIPTAN SUCCINATE 6 MG/0.5ML ~~LOC~~ SOLN
6.0000 mg | SUBCUTANEOUS | Status: DC | PRN
  Administered 2021-11-03: 6 mg via SUBCUTANEOUS
  Filled 2021-11-02 (×2): qty 0.5

## 2021-11-02 NOTE — Progress Notes (Signed)
Pt transferred via [facility] to [place] [facility]. Report called to [place], [place] [id number]. Pt and wife updated. SRP RN

## 2021-11-02 NOTE — Progress Notes (Addendum)
PROGRESS NOTE  CHANZE TEAGLE BJS:[id number] DOB: 23-May-1951 DOA: 10/26/2021 PCP: Shirline Frees, MD  Brief History   71 year old male with a history of atrial fibrillation on Xarelto, recent COVID infection, hypothyroidism, presented with worsening fatigue, decreased p.o. intake, fevers and shaking chills that began approximately 3 to 4 days prior to admission.  He was found to have streptococcal bacteremia.  Source of bacteremia is not entirely clear.  He has been started on IV antibiotics and admitted for further work-up.  Pt went to [facility] today for TEE. Dr. Debbe Mounts note mentions a thickened aortic root raising concern for an aortic root abscess. 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 I have discussed the patient with Dr. Cyndia Bent of CTS  at [facility]. The patient will be transferred to [facility] for CTS evaluation.  Consultants  Infectious disease Cardiology  Procedures  None  Antibiotics   Anti-infectives (From admission, onward)    Start     Dose/Rate Route Frequency Ordered Stop   10/30/21 1000  penicillin G potassium 12 Million Units in dextrose 5 % 500 mL continuous infusion        12 Million Units 41.7 mL/hr over 12 Hours Intravenous Every 12 hours 10/29/21 1455     10/27/21 1400  cefTRIAXone (ROCEPHIN) 2 g in sodium chloride 0.9 % 100 mL IVPB  Status:  Discontinued        2 g 200 mL/hr over 30 Minutes Intravenous Every 24 hours 10/27/21 1150 10/29/21 1455   10/27/21 0400  vancomycin (VANCOREADY) IVPB 1250 mg/250 mL  Status:  Discontinued        1,250 mg 166.7 mL/hr over 90 Minutes Intravenous Every 24 hours 10/26/21 1234 10/26/21 1235   10/27/21 0400  vancomycin (VANCOREADY) IVPB 1250 mg/250 mL  Status:  Discontinued        1,250 mg 166.7 mL/hr over 90 Minutes Intravenous Every 24 hours 10/26/21 1235 10/27/21 1149   10/26/21 1800  metroNIDAZOLE (FLAGYL) IVPB 500 mg  Status:  Discontinued        500 mg 100 mL/hr over 60 Minutes Intravenous Every 12  hours 10/26/21 1159 10/27/21 1709   10/26/21 1800  ceFEPIme (MAXIPIME) 2 g in sodium chloride 0.9 % 100 mL IVPB  Status:  Discontinued        2 g 200 mL/hr over 30 Minutes Intravenous Every 12 hours 10/26/21 1221 10/27/21 1149   10/26/21 0630  vancomycin (VANCOCIN) IVPB 1000 mg/200 mL premix        1,000 mg 200 mL/hr over 60 Minutes Intravenous  Once 10/26/21 0533 10/26/21 0737   10/26/21 0530  ceFEPIme (MAXIPIME) 2 g in sodium chloride 0.9 % 100 mL IVPB        2 g 200 mL/hr over 30 Minutes Intravenous  Once 10/26/21 0517 10/26/21 0623   10/26/21 0530  metroNIDAZOLE (FLAGYL) IVPB 500 mg        500 mg 100 mL/hr over 60 Minutes Intravenous  Once 10/26/21 0517 10/26/21 0636   10/26/21 0530  vancomycin (VANCOCIN) IVPB 1000 mg/200 mL premix        1,000 mg 200 mL/hr over 60 Minutes Intravenous  Once 10/26/21 0517 10/26/21 0636      Subjective  The patient is resting quietly. No new complaints.   Objective   Vitals:  Vitals:   11/02/21 1240 11/02/21 1740  BP: 124/79 (!) 131/98  Pulse: 81 78  Resp: 18 20  Temp: (!) 97.5 F (36.4 C) 98.5 F (36.9 C)  SpO2: 97% 96%    Exam:  Constitutional:  The patient is awake, alert, and oriented x 3. No acute distress. Respiratory:  No increased work of breathing. No wheezes, rales, or rhonchi No tactile fremitus Cardiovascular:  Regular rate and rhythm No murmurs, ectopy, or gallups. No lateral PMI. No thrills. Abdomen:  Abdomen is soft, non-tender, non-distended No hernias, masses, or organomegaly Normoactive bowel sounds.  Musculoskeletal:  No cyanosis, clubbing, or edema Skin:  No rashes, lesions, ulcers palpation of skin: no induration or nodules Neurologic:  CN 2-12 intact Sensation all 4 extremities intact Psychiatric:  Mental status Mood, affect appropriate Orientation to person, place, time  judgment and insight appear intact  I have personally reviewed the following:   Today's Data  Vitals  Lab Data   BMP CBC  Micro Data  Blood culture x 2: streptococcus mitis/oralis Urine culture: Insignificant growth  Imaging  CXR Maxillofacial CT MR brain without contrast Cardiac CT   Cardiology Data  EKG Echocardiogram TEE  Other Data    Scheduled Meds:  finasteride  5 mg Oral QHS   guaiFENesin  600 mg Oral BID   levothyroxine  112 mcg Oral QAC breakfast   liothyronine  25 mcg Oral QAC breakfast   metoprolol tartrate  12.5 mg Oral BID   mometasone-formoterol  2 puff Inhalation BID   pantoprazole  40 mg Oral BID   psyllium  1 packet Oral Daily   rivaroxaban  20 mg Oral Q supper   tamsulosin  0.4 mg Oral BID   venlafaxine XR  150 mg Oral Q breakfast   zolpidem  5 mg Oral QHS   Continuous Infusions:  penicillin g continuous IV infusion 12 Million Units (11/02/21 1122)    Principal Problem:   Streptococcal bacteremia Active Problems:   Obstructive sleep apnea   PAF (paroxysmal atrial fibrillation) (HCC)   Chronic anticoagulation   S/P AVR (aortic valve replacement) and aortoplasty   Sepsis (HCC)   BPH (benign prostatic hyperplasia)   AKI (acute kidney injury) ([place])   COVID-19   LOS: 7 days   A & P  Sepsis secondary to streptococcal bacteremia -Patient was admitted with fever, tachycardia, acute kidney injury -Source of bacteremia is not entirely clear at this point -CT of chest, abdomen, pelvis did not show any obvious source of infection.  Urinalysis also unremarkable -He has not had any recent skin infections that he is aware of -Currently on ceftriaxone -He had 2 positive sets of blood cultures (both aerobic and anaerobic) positive for gram-positive cocci.  Further identification shows Streptococcus mitis/oralis -Repeat blood cultures drawn on 1/9, no growth as of yet -CT maxillofacial without signs of infection/abscess -MRI brain without any evidence of septic emboli -2D echo without signs of vegetations -cardiology requested for TEE, this will be performed  tomorrow -appreciate ID assistance -Antibiotics changed to penicillin G -TEE demonstrated a thickened aortic root that raises concerns for an abscess of the aortic root. Cardiac CT ordered.  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 Cyndia Bent of [place]. I have discussed the patient with Dr. Lavon Paganini who will take over care of the patient at [facility].    Acute kidney injury -Secondary to sepsis -Renal function has improved with hydration   Paroxysmal atrial fibrillation -Anticoagulated with Xarelto -Chronically on metoprolol which was held on admission due to hypotension -Since blood pressures are improving, he was restarted on metoprolol at reduced dose   Aortic aneurysm status post Bentall  procedure -He is status post aortoplasty and AVR with bioprosthetic valve -Last saw CVTS approximately 1 month ago   Recent COVID infection -Was diagnosed 12/19 and completed a course of molnupiravir -Overall respiratory symptoms have improved -He is outside the window of isolation.  Would not place on airborne precautions   Hypothyroidism -Continue thyroid replacement   Anxiety -Continue As needed benzodiazepines   Obstructive sleep apnea -Continue on CPAP   BPH -Continue Flomax   History of asthmatic bronchitis -Continue inhaled steroids and bronchodilators   Thrombocytopenia -Related to sepsis -Improving -Continue to monitor  I have seen and examined this patient myself. I have spent 38 minutes in his evaluation and care.   DVT prophylaxis:  rivaroxaban (XARELTO) tablet 20 mg  Code Status: DNR Family Communication: Updated wife at the bedside Disposition Plan: Status is: Inpatient   Desia Saban, DO [facility] Direct contact: see [email or web address]  7PM-7AM contact night coverage as above 11/02/2021, 7:36 PM  LOS: 4 days

## 2021-11-02 NOTE — Progress Notes (Signed)
[facility] for Infectious Disease  Date of Admission:  10/26/2021   Total days of inpatient antibiotics 8  Principal Problem:   Streptococcal bacteremia Active Problems:   Obstructive sleep apnea   PAF (paroxysmal atrial fibrillation) (HCC)   Chronic anticoagulation   S/P AVR (aortic valve replacement) and aortoplasty   Sepsis (HCC)   BPH (benign prostatic hyperplasia)   AKI (acute kidney injury) ([place])   COVID-19          Assessment: 70 YM with afib on xarelto, aortic aneurysm  SP Bentall procedure(aortic root+aortic root replacement) in 2018, fount have strep mitis/oralis bacteremia suspected 2/2 odontogenic source.    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 CT maxillo facial was unrevealing.  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 No new complaints. Discussed antibiotic course.   Review of Systems: Review of Systems  All other systems reviewed and are negative.   Scheduled Meds:  finasteride  5 mg Oral QHS   guaiFENesin  600 mg Oral BID   levothyroxine   112 mcg Oral QAC breakfast   liothyronine  25 mcg Oral QAC breakfast   metoprolol tartrate  12.5 mg Oral BID   mometasone-formoterol  2 puff Inhalation BID   pantoprazole  40 mg Oral BID   psyllium  1 packet Oral Daily   rivaroxaban  20 mg Oral Q supper   tamsulosin  0.4 mg Oral BID   venlafaxine XR  150 mg Oral Q breakfast   zolpidem  5 mg Oral QHS   Continuous Infusions:  penicillin g continuous IV infusion 12 Million Units (11/01/21 2202)   PRN Meds:.acetaminophen **OR** acetaminophen, albuterol, albuterol, ALPRAZolam, azelastine, benzonatate, diphenhydrAMINE, ondansetron **OR** ondansetron (ZOFRAN) IV, rizatriptan, sodium chloride Allergies  Allergen Reactions   Perphenazine Swelling    Tongue swelling   Amiodarone Swelling   Codeine Itching   Tramadol Other (See Comments)    dellusion     OBJECTIVE: Vitals:   11/01/21 1455 11/01/21 1634 11/01/21 2217 11/02/21 0504  BP: 131/75 (!) 142/85 (!) 154/89 136/90  Pulse: 73 70 82 94  Resp: 16 20 19 19   Temp:  97.7 F (36.5 C) 98 F (36.7 C) 98.2 F (36.8 C)  TempSrc:  Oral Oral Oral  SpO2: 96% 93% 96% 93%  Weight:      Height:       Body mass index is 34.16 kg/m.  Physical Exam Constitutional:      General: He is not in acute distress.    Appearance: He is normal weight. He is not toxic-appearing.  HENT:     Head: Normocephalic and atraumatic.     Right Ear: External ear normal.     Left Ear: External ear normal.     Nose: No  congestion or rhinorrhea.     Mouth/Throat:     Mouth: Mucous membranes are moist.     Pharynx: Oropharynx is clear.  Eyes:     Extraocular Movements: Extraocular movements intact.     Conjunctiva/sclera: Conjunctivae normal.     Pupils: Pupils are equal, round, and reactive to light.  Cardiovascular:     Rate and Rhythm: Normal rate and regular rhythm.     Heart sounds: No murmur heard.   No friction rub. No gallop.  Pulmonary:     Effort: Pulmonary effort is normal.     Breath  sounds: Normal breath sounds.  Abdominal:     General: Abdomen is flat. Bowel sounds are normal.     Palpations: Abdomen is soft.  Musculoskeletal:        General: No swelling. Normal range of motion.     Cervical back: Normal range of motion and neck supple.  Skin:    General: Skin is warm and dry.  Neurological:     General: No focal deficit present.     Mental Status: He is oriented to person, place, and time.  Psychiatric:        Mood and Affect: Mood normal.      Lab Results Lab Results  Component Value Date   WBC 10.1 10/30/2021   HGB 12.9 (L) 10/30/2021   HCT 39.6 10/30/2021   MCV 97.3 10/30/2021   PLT 147 (L) 10/30/2021    Lab Results  Component Value Date   CREATININE 0.99 10/30/2021   BUN 11 10/30/2021   NA 136 10/30/2021   K 4.2 10/30/2021   CL 106 10/30/2021   CO2 26 10/30/2021    Lab Results  Component Value Date   ALT 21 10/27/2021   AST 28 10/27/2021   ALKPHOS 35 (L) 10/27/2021   BILITOT 0.5 10/27/2021        Laurice Record, [facility] for Infectious Disease [facility] 11/02/2021, 9:58 AM

## 2021-11-02 NOTE — Progress Notes (Signed)
Patient placed himself of home CPAP unit for the night.

## 2021-11-03 DIAGNOSIS — I351 Nonrheumatic aortic (valve) insufficiency: Secondary | ICD-10-CM

## 2021-11-03 DIAGNOSIS — I480 Paroxysmal atrial fibrillation: Secondary | ICD-10-CM

## 2021-11-03 DIAGNOSIS — I339 Acute and subacute endocarditis, unspecified: Secondary | ICD-10-CM

## 2021-11-03 DIAGNOSIS — Z8616 Personal history of COVID-19: Secondary | ICD-10-CM

## 2021-11-03 DIAGNOSIS — B955 Unspecified streptococcus as the cause of diseases classified elsewhere: Secondary | ICD-10-CM | POA: Diagnosis not present

## 2021-11-03 DIAGNOSIS — R7881 Bacteremia: Secondary | ICD-10-CM | POA: Diagnosis not present

## 2021-11-03 LAB — GLUCOSE, CAPILLARY: Glucose-Capillary: 107 mg/dL — ABNORMAL HIGH (ref 70–99)

## 2021-11-03 MED ORDER — GENTAMICIN IN SALINE 1.6-0.9 MG/ML-% IV SOLN
80.0000 mg | Freq: Three times a day (TID) | INTRAVENOUS | Status: DC
  Filled 2021-11-03 (×2): qty 50

## 2021-11-03 MED ORDER — LORAZEPAM 2 MG/ML IJ SOLN
0.5000 mg | Freq: Four times a day (QID) | INTRAMUSCULAR | Status: DC | PRN
  Administered 2021-11-04 – 2021-11-05 (×2): 0.5 mg via INTRAVENOUS
  Filled 2021-11-03 (×2): qty 1

## 2021-11-03 MED ORDER — GENTAMICIN SULFATE 40 MG/ML IJ SOLN
5.0000 mg/kg | INTRAVENOUS | Status: DC
  Filled 2021-11-03: qty 11

## 2021-11-03 MED ORDER — LORAZEPAM 2 MG/ML IJ SOLN
0.5000 mg | INTRAMUSCULAR | Status: AC
  Administered 2021-11-03: 0.5 mg via INTRAVENOUS
  Filled 2021-11-03: qty 1

## 2021-11-03 MED ORDER — GENTAMICIN SULFATE 40 MG/ML IJ SOLN
3.0000 mg/kg | INTRAVENOUS | Status: DC
  Administered 2021-11-03 – 2021-11-05 (×3): 260 mg via INTRAVENOUS
  Filled 2021-11-03 (×3): qty 6.5

## 2021-11-03 MED ORDER — ZOLPIDEM TARTRATE 5 MG PO TABS
5.0000 mg | ORAL_TABLET | Freq: Every day | ORAL | Status: DC
  Administered 2021-11-03 – 2021-11-04 (×2): 5 mg via ORAL
  Filled 2021-11-03 (×2): qty 1

## 2021-11-03 NOTE — Consult Note (Signed)
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 male.  HPI:   The patient is a 71 year old gentleman with a history of fibromyalgia and chronic fatigue syndrome, OSA on CPAP, paroxysmal atrial fibrillation on Xarelto, hypertension, and aortic valve disease with ascending aortic aneurysm who underwent biologic Bentall procedure by Dr. Darcey [name] in 2018.  I saw him in the office in follow-up on 09/25/2021 at which time he was complaining of a chronic sternal dehiscence with fibrous nonunion and having significant movement of his sternum with coughing and activity.  Since this was not causing him any pain we decided that we will continue following this for now.  His wife is with him at the bedside and gives most of the history.  She reports that over the past 6 to 9 months he had a bad tooth removed and then subsequently had a post inserted for planned implant.  He [name] been on antibiotics with each dental visit.  He presented with a several week history of fatigue, diaphoresis and chills.  She said that he felt hot but she took his temperature using a forehead thermometer and he never had a documented fever.  She did a COVID test on him and it was positive and he completed a course of Lagevrio with improvement.  Then he had an episode of rigors and felt poorly and she brought him to the emergency department.  His white blood cell count was 3.5 on presentation with a platelet count of 96,000.  His creatinine was 1.6.  He had a temperature of 103.1 with tachycardia and some hypotension.  Blood cultures grew Streptococcus Mitis/Oralis.  A 2D echocardiogram showed a normal functioning aortic valve prosthesis with a mean gradient of 18 mmHg.  There were no vegetation  seen on any of the valves.  He subsequently had a TEE on 11/01/2021 which showed some thickening around the aortic root.  The aortic valve was functioning normally with no sign of vegetation and no regurgitation.  There were no vegetation seen on any of the other heart valves.  Left ventricular ejection fraction was normal.  There was concern about the possibility of aortic root abscess and a gated cardiac CTA was performed.  This was read by Dr. Farris [name] who felt that there was some contrast extravasation around the annulus circumferentially consistent with aortic root abscess.  There was no evidence of fistula to the LVOT.  There were no fluid collections around the aortic root.  He was transferred from [facility] for further evaluation by cardiothoracic surgery.  Past Medical History:  Diagnosis Date   Allergic rhinitis    Anxiety    Arthritis    Asthma    Bladder tumor    Chronic fatigue    COVID-19 05/22/2021   Depression    06/30/17 Pt denies being depressed, reports Effexor is taken for Chronic Fatigue    Dyspnea    Enlarged prostate    Fibromyalgia    GERD (gastroesophageal reflux disease)    Headache    History of chronic bronchitis    History of migraine    History of toxic encephalopathy    Hypothyroidism    OSA on  CPAP    CPAP 14   PAF (paroxysmal atrial fibrillation) (HCC) CARDIOLOGIST -- DR Grant [name]   DX OCT 2013   S/P AVR (aortic valve replacement) and aortoplasty    Sensitiveness to light    Unspecified essential hypertension    Urethral tumor    PROSTATIC    Past Surgical History:  Procedure Laterality Date   BENTALL PROCEDURE N/A 07/02/2017   Procedure: BENTALL PROCEDURE;  Surgeon: Grant Poot, MD;  Location: [place];  Service: Open Heart Surgery;  Laterality: N/A;  WITH CIRC ARREST   BIOPSY  02/06/2021   Procedure: BIOPSY;  Surgeon: Grant Essex, MD;  Location: [facility];  Service: Endoscopy;;   COLONOSCOPY     COLONOSCOPY WITH PROPOFOL N/A  02/06/2021   Procedure: COLONOSCOPY WITH PROPOFOL;  Surgeon: Grant Essex, MD;  Location: [facility];  Service: Endoscopy;  Laterality: N/A;   CYSTOSCOPY W/ RETROGRADES Bilateral 06/15/2013   Procedure: CYSTOSCOPY WITH BILATERAL RETROGRADE PYELOGRAM  BLADDER BIOPSY, PROSTATIC URETHRAL BIOPSY, ;  Surgeon: Grant Hazard, MD;  Location: [facility];  Service: Urology;  Laterality: Bilateral;   ESOPHAGOGASTRODUODENOSCOPY (EGD) WITH PROPOFOL N/A 03/11/2018   Procedure: ESOPHAGOGASTRODUODENOSCOPY (EGD) WITH PROPOFOL;  Surgeon: Grant Essex, MD;  Location: [place];  Service: Endoscopy;  Laterality: N/A;   LAPAROSCOPIC CHOLECYSTECTOMY  01/14/2001   PROCTOSCOPY N/A 06/12/2021   Procedure: RIGID PROCTOSCOPY;  Surgeon: Grant Boston, MD;  Location: [facility];  Service: General;  Laterality: N/A;   RIGHT/LEFT HEART CATH AND CORONARY ANGIOGRAPHY N/A 06/12/2017   Procedure: RIGHT/LEFT HEART CATH AND CORONARY ANGIOGRAPHY;  Surgeon: Grant Dresser, MD;  Location: [facility];  Service: Cardiovascular;  Laterality: N/A;   TEE WITHOUT CARDIOVERSION N/A 07/02/2017   Procedure: TRANSESOPHAGEAL ECHOCARDIOGRAM (TEE);  Surgeon: Grant [name], Grant Salina, MD;  Location: [place];  Service: Open Heart Surgery;  Laterality: N/A;   UMBILICAL HERNIA REPAIR  01/14/2008    Family History  Problem Relation Age of Onset   Aortic aneurysm Mother 58       cause of death   Other Father        motor vehicle accident   Heart disease Other        family history    Social History:  reports that he quit smoking about 42 years ago. His smoking use included cigarettes. He started smoking about 53 years ago. He [name] a 13.50 pack-year smoking history. He [name] never used smokeless tobacco. He reports current alcohol use of about 4.0 standard drinks per week. He reports that he does not use drugs.  Allergies:  Allergies  Allergen Reactions   Perphenazine Swelling    Tongue swelling   Amiodarone Swelling    Codeine Itching   Tramadol Other (See Comments)    dellusion     Medications: I have reviewed the patient's current medications. Prior to Admission:  Medications Prior to Admission  Medication Sig Dispense Refill Last Dose   albuterol (VENTOLIN HFA) 108 (90 Base) MCG/ACT inhaler INHALE 2 PUFFS INTO THE LUNGS EVERY 6 HOURS AS NEEDED FOR WHEEZING OR SHORTNESS OF BREATH (Patient taking differently: 2 puffs every 6 (six) hours as needed for shortness of breath.) 18 g 5 10/26/2021   ALPRAZolam (XANAX) 0.25 MG tablet Take 1 tablet (0.25 mg total) by mouth 3 (three) times daily as needed for anxiety. 15 tablet 0 10/26/2021   aluminum hydroxide-magnesium carbonate (GAVISCON) 95-358 MG/15ML SUSP Take 15 mLs by mouth as needed for indigestion or heartburn.   Past Week  azelastine (ASTELIN) 137 MCG/SPRAY nasal spray Place 1-2 sprays into the nose 2 (two) times daily as needed for rhinitis or allergies.    Past Week   benzonatate (TESSALON) 200 MG capsule Take 1 capsule (200 mg total) by mouth 3 (three) times daily as needed for cough. 60 capsule 3 10/25/2021   budesonide-formoterol (SYMBICORT) 160-4.5 MCG/ACT inhaler Inhale 2 puffs into the lungs in the morning and at bedtime. 1 each 5 10/25/2021   calcium carbonate (TUMS - DOSED IN MG ELEMENTAL CALCIUM) 500 MG chewable tablet Chew 500-1,000 mg by mouth daily as needed for indigestion or heartburn.   Past Week   COLCRYS 0.6 MG tablet Take 0.6 mg by mouth in the morning.   10/25/2021   Dextromethorphan-guaiFENesin (MUCINEX DM MAXIMUM STRENGTH) 60-1200 MG TB12 Take 1 tablet by mouth 2 (two) times daily as needed (congestion/cough).   10/25/2021   diclofenac Sodium (VOLTAREN) 1 % GEL Apply 2 g topically daily as needed (pain).   10/25/2021   diphenhydrAMINE (BENADRYL) 25 MG tablet Take 50 mg by mouth daily as needed for allergies.   Past Month   doxycycline (VIBRAMYCIN) 100 MG capsule Take 100 mg by mouth 2 (two) times daily.   10/25/2021   EPINEPHrine 0.3 mg/0.3 mL IJ  SOAJ injection Inject 0.3 mLs (0.3 mg total) into the muscle as needed (for angioedema). 2 Device 0 unk   Erenumab-aooe (AIMOVIG) 140 MG/ML SOAJ Inject 140 mg into the skin every 28 (twenty-eight) days. 1 mL 11 Past Month   Ferrous Sulfate (IRON) 142 (45 Fe) MG TBCR Take 45 mg by mouth in the morning and at bedtime.   10/25/2021   finasteride (PROSCAR) 5 MG tablet Take 5 mg by mouth at bedtime.    10/25/2021   levothyroxine (SYNTHROID, LEVOTHROID) 112 MCG tablet Take 112 mcg by mouth daily before breakfast.    10/25/2021   liothyronine (CYTOMEL) 25 MCG tablet Take 25 mcg by mouth daily before breakfast.   10/25/2021   losartan (COZAAR) 50 MG tablet TAKE 1 TABLET BY MOUTH EVERY DAY (Patient taking differently: Take 50 mg by mouth daily.) 30 tablet 6 10/25/2021   Menthol, Topical Analgesic, (BLUE-EMU MAXIMUM STRENGTH EX) Apply 1 application topically as needed (pain).   Past Month   Methylcellulose, Laxative, (FIBER THERAPY) 500 MG TABS Take 500-1,000 mg by mouth daily.   10/25/2021   metoprolol tartrate (LOPRESSOR) 50 MG tablet Take 1 tablet (50 mg total) by mouth 2 (two) times daily. 180 tablet 3 10/25/2021 at 1800   Multiple Vitamin (MULTIVITAMIN WITH MINERALS) TABS tablet Take 1 tablet by mouth daily.   10/25/2021   olopatadine (PATANOL) 0.1 % ophthalmic solution Place 1 drop into both eyes 2 (two) times daily as needed for allergies.   Past Week   omeprazole (PRILOSEC) 40 MG capsule Take 1 capsule (40 mg total) by mouth 2 (two) times daily. 180 capsule 1 10/25/2021   rivaroxaban (XARELTO) 20 MG TABS tablet Take 1 tablet (20 mg total) by mouth daily with supper. 90 tablet 3 10/25/2021 at 1800   rizatriptan (MAXALT) 10 MG tablet Take 1 tablet (10 mg total) by mouth as needed for migraine. May repeat in 2 hours if needed 10 tablet 11 10/25/2021   saccharomyces boulardii (FLORASTOR) 250 MG capsule Take 250 mg by mouth daily as needed (when taking antibiotics).   10/25/2021   sodium chloride (OCEAN) 0.65 % SOLN nasal spray  Place 1 spray into both nostrils as needed for congestion.   Past Week  tamsulosin (FLOMAX) 0.4 MG CAPS capsule Take 0.4 mg by mouth in the morning and at bedtime.   10/25/2021   Testosterone Cypionate 200 MG/ML SOLN Inject 100 Units into the muscle once a week. Fridays   Past Week   venlafaxine (EFFEXOR-XR) 150 MG 24 hr capsule Take 150 mg by mouth daily with breakfast.   10/25/2021   NON FORMULARY Pt uses a cpap nightly      Scheduled:  finasteride  5 mg Oral QHS   guaiFENesin  600 mg Oral BID   levothyroxine  112 mcg Oral QAC breakfast   liothyronine  25 mcg Oral QAC breakfast   metoprolol tartrate  12.5 mg Oral BID   mometasone-formoterol  2 puff Inhalation BID   pantoprazole  40 mg Oral BID   psyllium  1 packet Oral Daily   rivaroxaban  20 mg Oral Q supper   tamsulosin  0.4 mg Oral BID   venlafaxine XR  150 mg Oral Q breakfast   zolpidem  5 mg Oral QHS   Continuous:  gentamicin     penicillin g continuous IV infusion 12 Million Units (11/03/21 1258)   DJT:[id number] **OR** acetaminophen, albuterol, albuterol, ALPRAZolam, azelastine, benzonatate, diphenhydrAMINE, LORazepam, ondansetron **OR** ondansetron (ZOFRAN) IV, sodium chloride, SUMAtriptan Anti-infectives (From admission, onward)    Start     Dose/Rate Route Frequency Ordered Stop   11/03/21 1430  gentamicin (GARAMYCIN) IVPB 80 mg        80 mg 100 mL/hr over 30 Minutes Intravenous Every 8 hours 11/03/21 1341 11/17/21 1429   11/03/21 1400  gentamicin (GARAMYCIN) 440 mg in dextrose 5 % 100 mL IVPB  Status:  Discontinued        5 mg/kg  87 kg (Adjusted) 111 mL/hr over 60 Minutes Intravenous Every 24 hours 11/03/21 1313 11/03/21 1315   10/30/21 1000  penicillin G potassium 12 Million Units in dextrose 5 % 500 mL continuous infusion        12 Million Units 41.7 mL/hr over 12 Hours Intravenous Every 12 hours 10/29/21 1455     10/27/21 1400  cefTRIAXone (ROCEPHIN) 2 g in sodium chloride 0.9 % 100 mL IVPB  Status:   Discontinued        2 g 200 mL/hr over 30 Minutes Intravenous Every 24 hours 10/27/21 1150 10/29/21 1455   10/27/21 0400  vancomycin (VANCOREADY) IVPB 1250 mg/250 mL  Status:  Discontinued        1,250 mg 166.7 mL/hr over 90 Minutes Intravenous Every 24 hours 10/26/21 1234 10/26/21 1235   10/27/21 0400  vancomycin (VANCOREADY) IVPB 1250 mg/250 mL  Status:  Discontinued        1,250 mg 166.7 mL/hr over 90 Minutes Intravenous Every 24 hours 10/26/21 1235 10/27/21 1149   10/26/21 1800  metroNIDAZOLE (FLAGYL) IVPB 500 mg  Status:  Discontinued        500 mg 100 mL/hr over 60 Minutes Intravenous Every 12 hours 10/26/21 1159 10/27/21 1709   10/26/21 1800  ceFEPIme (MAXIPIME) 2 g in sodium chloride 0.9 % 100 mL IVPB  Status:  Discontinued        2 g 200 mL/hr over 30 Minutes Intravenous Every 12 hours 10/26/21 1221 10/27/21 1149   10/26/21 0630  vancomycin (VANCOCIN) IVPB 1000 mg/200 mL premix        1,000 mg 200 mL/hr over 60 Minutes Intravenous  Once 10/26/21 0533 10/26/21 0737   10/26/21 0530  ceFEPIme (MAXIPIME) 2 g in sodium chloride 0.9 % 100 mL IVPB  2 g 200 mL/hr over 30 Minutes Intravenous  Once 10/26/21 0517 10/26/21 0623   10/26/21 0530  metroNIDAZOLE (FLAGYL) IVPB 500 mg        500 mg 100 mL/hr over 60 Minutes Intravenous  Once 10/26/21 0517 10/26/21 0636   10/26/21 0530  vancomycin (VANCOCIN) IVPB 1000 mg/200 mL premix        1,000 mg 200 mL/hr over 60 Minutes Intravenous  Once 10/26/21 0517 10/26/21 0636       No results found for this or any previous visit (from the past 46 hour(s)).  CT CORONARY MORPH W/CTA COR W/SCORE W/CA W/CM &/OR WO/CM  Addendum Date: 11/01/2021   ADDENDUM REPORT: 11/01/2021 18:06 CLINICAL DATA:  Concerns for aortic root abscess. S/p Bentall procedure with aortic root replacement (28 mm graft) and 25 mm Magna Ease Bioprosthetic valve 07/04/2017. EXAM: Cardiac/Coronary CTA TECHNIQUE: A non-contrast, gated CT scan was obtained with axial slices of 3  mm through the heart for calcium scoring. Calcium scoring was performed using the Agatston method. A 130 kV retrospective, gated, contrast cardiac scan was obtained. Gantry rotation speed was 250 msecs and collimation was 0.6 mm. Two sublingual nitroglycerin tablets (0.8 mg) were given. The 3D data set was reconstructed in 5% intervals of the 0-95% of the R-R cycle. Diastolic phases were analyzed on a dedicated workstation using MPR, MIP, and VRT modes. The patient received 95 cc of contrast. FINDINGS: Image quality: Excellent. Noise artifact is: Limited. Aortic Root: The aortic root is s/p replacement with graft. The largest dimension is 37 mm in double oblique. The aortic root at the level of the stented aortic valve prosthesis is thickened with contrast extravasation consistent with aortic root abscess. The abscess appears to be circumferential with multiple connections to the root. There is no evidence of fistula to the LVOT. Aortic Prosthesis: A 25 mm Magna ease is present in the aortic position. The leaflets open well. No evidence of valvular vegetation. The leaflets appear grossly normal. Aortic root abscess described above. Coronary Arteries: Coronary arteries have been reimplanted in normal position. Right dominance. Left main: The left main is a large caliber vessel with a normal take off above the left coronary cusp that bifurcates to form a left anterior descending artery and a left circumflex artery. trifurcates into a LAD, LCX, and ramus intermedius. There is no plaque or stenosis. Left anterior descending artery: The proximal LAD contains minimal calcified plaque (<25%). The mid LAD contains mild non-calcified plaque (25-49%). 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 The distal LAD is patent. The LAD gives off 1 large patent diagonal branch. Left circumflex artery: The LCX is non-dominant and patent with no evidence of plaque or stenosis. The LCX gives off 2 patent obtuse marginal branches. Right  coronary artery: The RCA is dominant with normal take off from the right coronary cusp. There is minimal non-calcified plaque in the distal RCA (<25%). The RCA terminates as a PDA without evidence of plaque or stenosis. Right Atrium: Right atrial size is within normal limits. Right Ventricle: The right ventricular cavity is within normal limits. Left Atrium: Left atrial size is normal in size with no left atrial appendage filling defect. Left Ventricle: The ventricular cavity size is within normal limits. There are no stigmata of prior infarction. There is no abnormal filling defect. Normal left ventricular function, LVEF=57%. Postoperative septal movement. Pulmonary arteries: Dilated pulmonary artery consistent with pulmonary hypertension. Pulmonary veins: Normal pulmonary venous drainage. Pericardium: Normal thickness with no significant effusion or calcium  present. Cardiac valves: The mitral valve is normal structure without significant calcification. Aorta: 32 mm at level of main pulmonary artery. Extra-cardiac findings: See attached radiology report for non-cardiac structures. IMPRESSION: 1. Coronary calcium score of 3.9. This was 20th percentile for age-, sex, and race-matched controls. 2. S/p aortic valve replacement with 25 mm Magna and aortic root replacement with concerns for aortic root abscess described above. 3. S/p coronary reimplantation. 4. 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 Mid LAD myocardial bridge (normal) 5. Minimal CAD in the distal RCA (<25%). Eleonore Chiquito, MD Electronically Signed   By: Eleonore Chiquito M.D.   On: 11/01/2021 18:06   Result Date: 11/01/2021 EXAM: OVER-READ INTERPRETATION  CT CHEST The following report is an over-read performed by radiologist Dr. Abigail Miyamoto of [facility] [place] on 11/01/2021. This over-read does not include interpretation of cardiac or coronary anatomy or pathology. The coronary CTA interpretation by the cardiologist is attached. COMPARISON:  10/26/2021  chest CT FINDINGS: Vascular: Aortic atherosclerosis. Tortuous thoracic aorta. Pulmonary artery enlargement, outflow tract 3.2 cm. No central pulmonary embolism, on this non-dedicated study. Mediastinum/Nodes: No mediastinal or hilar adenopathy. Lungs/Pleura: No pleural fluid. Trace left pleural fluid is similar. Trace loculated right-sided pleural fluid or thickening posteriorly and medially on 30/8, similar. Left base atelectasis. Upper Abdomen: Normal imaged portions of the liver, spleen, stomach. Musculoskeletal: Median sternotomy. Some of the wires (example 65/8 and 32/8) do not traverse the sternotomy wound. Most consistent with chronic ununited sternotomy. Advanced thoracic spondylosis. IMPRESSION: 1.  No acute findings in the imaged extracardiac chest. 2. Trace left-sided pleural fluid with minimal loculated right-sided pleural fluid or thickening, similar. Similar left base atelectasis. 3.  Aortic Atherosclerosis (ICD10-I70.0). Electronically Signed: By: Abigail Miyamoto M.D. On: 11/01/2021 17:14   ECHO TEE  Result Date: 11/01/2021    TRANSESOPHOGEAL ECHO REPORT   Patient Name:   Grant [name] [facility] Date of Exam: 11/01/2021 Medical Rec #:  [id number]        Height:       70.0 in Accession #:    [id number]       Weight:       240.0 lb Date of Birth:  03/11/1951        BSA:          2.255 m Patient Age:    51 years         BP:           119/71 mmHg Patient Gender: M                HR:           74 bpm. Exam Location:  Inpatient Procedure: Transesophageal Echo, Color Doppler, Cardiac Doppler and Saline            Contrast Bubble Study Indications:     Bacteremia  History:         Patient [name] prior history of Echocardiogram examinations, most                  recent 10/28/2021. Arrythmias:Atrial Fibrillation,                  Signs/Symptoms:Dyspnea; Risk Factors:Hypertension.                  Aortic Valve: 25 mm Edwards bioprosthetic valve is present in                  the aortic position. Procedure Date: 07/02/17.   Sonographer:  Bernadene Person RDCS Referring Phys:  [place] Diagnosing Phys: Eleonore Chiquito MD PROCEDURE: After discussion of the risks and benefits of a TEE, an informed consent was obtained from the patient. TEE procedure time was 20 minutes. The transesophogeal probe was passed without difficulty through the esophogus of the patient. Imaged were obtained with the patient in a left lateral decubitus position. Local oropharyngeal anesthetic was provided with Cetacaine. Sedation performed by different physician. The patient was monitored while under deep sedation. Anesthestetic sedation was provided intravenously by Anesthesiology: 320mg  of Propofol, 100mg  of Lidocaine. Image quality was excellent. The patient's vital signs; including heart rate, blood pressure, and oxygen saturation; remained stable throughout the procedure. The patient developed no complications during the procedure. IMPRESSIONS  1. 28 mm aortic root graft. The root is thickened more than expected this far out from surgery. 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 The anterior aspect of the root is not well visualized. Would recommend a gated cardiac CT to better assess for aortic root abscess. No signs of valvular vegetation. Aortic root/ascending aorta [name] been repaired/replaced.  2. Left ventricular ejection fraction, by estimation, is 60 to 65%. The left ventricle [name] normal function.  3. Right ventricular systolic function is normal. The right ventricular size is normal.  4. No left atrial/left atrial appendage thrombus was detected. The LAA emptying velocity was 54 cm/s.  5. The mitral valve is grossly normal. Mild mitral valve regurgitation. No evidence of mitral stenosis.  6. The aortic valve [name] been repaired/replaced. Aortic valve regurgitation is not visualized. There is a 25 mm Edwards bioprosthetic valve present in the aortic position. Procedure Date: 07/02/17. Echo findings are consistent with normal structure  and function of the aortic valve prosthesis.  7. Agitated saline contrast bubble study was negative, with no evidence of any interatrial shunt. FINDINGS  Left Ventricle: Left ventricular ejection fraction, by estimation, is 60 to 65%. The left ventricle [name] normal function. The left ventricular internal cavity size was normal in size. Right Ventricle: The right ventricular size is normal. No increase in right ventricular wall thickness. Right ventricular systolic function is normal. Left Atrium: Left atrial size was normal in size. No left atrial/left atrial appendage thrombus was detected. The LAA emptying velocity was 54 cm/s. Right Atrium: Right atrial size was normal in size. Pericardium: Trivial pericardial effusion is present. Mitral Valve: The mitral valve is grossly normal. Mild mitral valve regurgitation. No evidence of mitral valve stenosis. There is no evidence of mitral valve vegetation. Tricuspid Valve: The tricuspid valve is grossly normal. Tricuspid valve regurgitation is trivial. No evidence of tricuspid stenosis. There is no evidence of tricuspid valve vegetation. Aortic Valve: 25 mm Magna Ease in the aortic position. Vmax 2.7 m/s, MG 17 mmHG, EOA 1.72 cm2, DI 0.38 consistent with normal function. No signs of valvular vegetation. The aortic valve [name] been repaired/replaced. Aortic valve regurgitation is not visualized. Aortic valve mean gradient measures 17.0 mmHg. Aortic valve peak gradient measures 29.8 mmHg. Aortic valve area, by VTI measures 1.72 cm. There is a 25 mm Edwards bioprosthetic valve present in the aortic position. Procedure Date: 07/02/17. Echo findings are consistent with normal structure and function of the aortic valve prosthesis. Pulmonic Valve: The pulmonic valve was grossly normal. Pulmonic valve regurgitation is not visualized. No evidence of pulmonic stenosis. There is no evidence of pulmonic valve vegetation. Aorta: 28 mm aortic root graft. The root is thickened more than  expected this far out from surgery. Findings could represent aortic  root abscesss. The anterior aspect of the root is not well visualized. Would recommend a gated cardiac CT to better assess  for aortic root abscess. No signs of valvular vegetation. The aortic root/ascending aorta [name] been repaired/replaced. There is minimal (Grade I) layered plaque involving the descending aorta. IAS/Shunts: No atrial level shunt detected by color flow Doppler. Agitated saline contrast was given intravenously to evaluate for intracardiac shunting. Agitated saline contrast bubble study was negative, with no evidence of any interatrial shunt.  LEFT VENTRICLE PLAX 2D LVOT diam:     2.40 cm LV SV:         76 LV SV Index:   34 LVOT Area:     4.52 cm  AORTIC VALVE AV Area (Vmax):    1.65 cm AV Area (Vmean):   1.65 cm AV Area (VTI):     1.72 cm AV Vmax:           273.00 cm/s AV Vmean:          192.000 cm/s AV VTI:            0.444 m AV Peak Grad:      29.8 mmHg AV Mean Grad:      17.0 mmHg LVOT Vmax:         99.60 cm/s LVOT Vmean:        70.000 cm/s LVOT VTI:          0.169 m LVOT/AV VTI ratio: 0.38  SHUNTS Systemic VTI:  0.17 m Systemic Diam: 2.40 cm Eleonore Chiquito MD Electronically signed by Eleonore Chiquito MD Signature Date/Time: 11/01/2021/3:25:56 PM    Final     Review of Systems  Constitutional:  Positive for chills, diaphoresis and unexpected weight change. Negative for activity change and fever.       Says he lost 80 to 90 pounds over the past 6 to 9 months due to decreased appetite  HENT: Negative.    Eyes: Negative.   Respiratory:  Negative for chest tightness and shortness of breath.   Cardiovascular:  Negative for chest pain, palpitations and leg swelling.  Gastrointestinal: Negative.   Endocrine: Negative.   Genitourinary: Negative.   Musculoskeletal:        Fibromyalgia and chronic pain syndrome  Skin: Negative.   Allergic/Immunologic: Negative.   Neurological:  Negative for dizziness, syncope and headaches.   Hematological: Negative.   Psychiatric/Behavioral:         [name] chronic decreased concentration and memory  Blood pressure 129/89, pulse (!) 104, temperature 98 F (36.7 C), temperature source Oral, resp. rate 20, height 5\' 10"  (1.778 m), weight 108 kg, SpO2 96 %. Physical Exam Constitutional:      Appearance: Normal appearance.  HENT:     Head: Normocephalic and atraumatic.     Mouth/Throat:     Mouth: Mucous membranes are moist.     Pharynx: Oropharynx is clear.  Eyes:     Extraocular Movements: Extraocular movements intact.     Conjunctiva/sclera: Conjunctivae normal.     Pupils: Pupils are equal, round, and reactive to light.  Cardiovascular:     Rate and Rhythm: Normal rate and regular rhythm.     Pulses: Normal pulses.     Heart sounds: Normal heart sounds. No murmur heard. Pulmonary:     Effort: Pulmonary effort is normal.     Breath sounds: Normal breath sounds.  Abdominal:     General: Bowel sounds are normal. There is no distension.     Palpations: Abdomen is  soft.     Tenderness: There is no abdominal tenderness.  Musculoskeletal:        General: No swelling. Normal range of motion.     Cervical back: Normal range of motion and neck supple.  Skin:    General: Skin is warm and dry.  Neurological:     General: No focal deficit present.     Mental Status: [name], place, and time.  Psychiatric:        Mood and Affect: Mood normal.        Behavior: Behavior normal.   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   EXAM: Cardiac/Coronary CTA   TECHNIQUE: A non-contrast, gated CT scan was obtained with axial slices of 3 mm through the heart for calcium scoring. Calcium scoring was performed using the Agatston method. A 130 kV retrospective, gated, contrast cardiac scan was  obtained. Gantry rotation speed was 250 msecs and collimation was 0.6 mm. Two sublingual nitroglycerin tablets (0.8 mg) were given. The 3D data set was reconstructed in 5% intervals of the 0-95% of the R-R cycle. Diastolic phases were analyzed on a dedicated workstation using MPR, MIP, and VRT modes. The patient received 95 cc of contrast.   FINDINGS: Image quality: Excellent.   Noise artifact is: Limited.   Aortic Root: The aortic root is s/p replacement with graft. The largest dimension is 37 mm in double oblique. The aortic root at the level of the stented aortic valve prosthesis is thickened with contrast extravasation consistent with aortic root abscess. The abscess appears to be circumferential with multiple connections to the root. There is no evidence of fistula to the LVOT.   Aortic Prosthesis: A 25 mm Magna ease is present in the aortic position. The leaflets open well. No evidence of valvular vegetation. The leaflets appear grossly normal. Aortic root abscess described above.   Coronary Arteries: Coronary arteries have been reimplanted in normal position. Right dominance.   Left main: The left main is a large caliber vessel with a normal take off above the left coronary cusp that bifurcates to form a left anterior descending artery and a left circumflex artery. trifurcates into a LAD, LCX, and ramus intermedius. There is no plaque or stenosis.   Left anterior descending artery: The proximal LAD contains minimal calcified plaque (<25%). The mid LAD contains mild non-calcified plaque (25-49%). 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 The distal LAD is patent. The LAD gives off 1 large patent diagonal branch.   Left circumflex artery: The LCX is non-dominant and patent with no evidence of plaque or stenosis. The LCX gives off 2 patent obtuse marginal branches.   Right coronary artery: The RCA is dominant with normal take off from the right coronary cusp. There is  minimal non-calcified plaque in the distal RCA (<25%). The RCA terminates as a PDA without evidence of plaque or stenosis.   Right Atrium: Right atrial size is within normal limits.   Right Ventricle: The right ventricular cavity is within normal limits.   Left Atrium: Left atrial size is normal in size with no left atrial appendage filling defect.   Left Ventricle: The ventricular cavity size is within normal limits. There are no stigmata of prior infarction. There is no abnormal filling defect. Normal left ventricular function, LVEF=57%. Postoperative septal movement.   Pulmonary  arteries: Dilated pulmonary artery consistent with pulmonary hypertension.   Pulmonary veins: Normal pulmonary venous drainage.   Pericardium: Normal thickness with no significant effusion or calcium present.   Cardiac valves: The mitral valve is normal structure without significant calcification.   Aorta: 32 mm at level of main pulmonary artery.   Extra-cardiac findings: See attached radiology report for non-cardiac structures.   IMPRESSION: 1. Coronary calcium score of 3.9. This was 20th percentile for age-, sex, and race-matched controls.   2. S/p aortic valve replacement with 25 mm Magna and aortic root replacement with concerns for aortic root abscess described above.   3. S/p coronary reimplantation.   4. 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 Mid LAD myocardial bridge (normal)   5. Minimal CAD in the distal RCA (<25%).   Eleonore Chiquito, MD     Electronically Signed   By: Eleonore Chiquito M.D.   On: 11/01/2021 18:06    Addended by Geralynn Rile, MD on 11/01/2021  6:08 PM   Study Result  Narrative & Impression  EXAM: OVER-READ INTERPRETATION  CT CHEST   The following report is an over-read performed by radiologist Dr. Abigail Miyamoto of [facility], [place] on 11/01/2021. This over-read does not include interpretation of cardiac or coronary anatomy or pathology. The  coronary CTA interpretation by the cardiologist is attached.   COMPARISON:  10/26/2021 chest CT   FINDINGS: Vascular: Aortic atherosclerosis. Tortuous thoracic aorta. Pulmonary artery enlargement, outflow tract 3.2 cm. No central pulmonary embolism, on this non-dedicated study.   Mediastinum/Nodes: No mediastinal or hilar adenopathy.   Lungs/Pleura: No pleural fluid. Trace left pleural fluid is similar. Trace loculated right-sided pleural fluid or thickening posteriorly and medially on 30/8, similar. Left base atelectasis.   Upper Abdomen: Normal imaged portions of the liver, spleen, stomach.   Musculoskeletal: Median sternotomy. Some of the wires (example 65/8 and 32/8) do not traverse the sternotomy wound. Most consistent with chronic ununited sternotomy. Advanced thoracic spondylosis.   IMPRESSION: 1.  No acute findings in the imaged extracardiac chest. 2. Trace left-sided pleural fluid with minimal loculated right-sided pleural fluid or thickening, similar. Similar left base atelectasis. 3.  Aortic Atherosclerosis (ICD10-I70.0).   Electronically Signed: By: Abigail Miyamoto M.D. On: 11/01/2021 17:14       TRANSESOPHOGEAL ECHO REPORT         Patient Name:   [name] Date of Exam: 11/01/2021  Medical Rec #:  [id number]        Height:       70.0 in  Accession #:    [id number]       Weight:       240.0 lb  Date of Birth:  11-23-1950        BSA:          2.255 m  Patient Age:    74 years         BP:           119/71 mmHg  Patient Gender: M                HR:           74 bpm.  Exam Location:  Inpatient   Procedure: Transesophageal Echo, Color Doppler, Cardiac Doppler and Saline             Contrast Bubble Study   Indications:     Bacteremia     History:         Patient [name] prior history  of Echocardiogram examinations,  most                   recent 10/28/2021. Arrythmias:Atrial Fibrillation,                   Signs/Symptoms:Dyspnea; Risk Factors:Hypertension.                    Aortic Valve: 25 mm Edwards bioprosthetic valve is  present in                   the aortic position. Procedure Date: 07/02/17.     Sonographer:     Bernadene Person RDCS  Referring Phys:  [place]  Diagnosing Phys: Eleonore Chiquito MD   PROCEDURE: After discussion of the risks and benefits of a TEE, an  informed consent was obtained from the patient. TEE procedure time was 20  minutes. The transesophogeal probe was passed without difficulty through  the esophogus of the patient. Imaged  were obtained with the patient in a left lateral decubitus position. Local  oropharyngeal anesthetic was provided with Cetacaine. Sedation performed  by different physician. The patient was monitored while under deep  sedation. Anesthestetic sedation was  provided intravenously by Anesthesiology: 320mg  of Propofol, 100mg  of  Lidocaine. Image quality was excellent. The patient's vital signs;  including heart rate, blood pressure, and oxygen saturation; remained  stable throughout the procedure. The patient  developed no complications during the procedure.   IMPRESSIONS     1. 28 mm aortic root graft. The root is thickened more than expected this  far out from surgery. 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 The  anterior aspect of the root is not well visualized. Would recommend a  gated cardiac CT to better assess  for aortic root abscess. No signs of valvular vegetation. Aortic  root/ascending aorta [name] been repaired/replaced.   2. Left ventricular ejection fraction, by estimation, is 60 to 65%. The  left ventricle [name] normal function.   3. Right ventricular systolic function is normal. The right ventricular  size is normal.   4. No left atrial/left atrial appendage thrombus was detected. The LAA  emptying velocity was 54 cm/s.   5. The mitral valve is grossly normal. Mild mitral valve regurgitation.  No evidence of mitral stenosis.   6. The aortic valve [name] been  repaired/replaced. Aortic valve  regurgitation is not visualized. There is a 25 mm Edwards bioprosthetic  valve present in the aortic position. Procedure Date: 07/02/17. Echo  findings are consistent with normal structure and  function of the aortic valve prosthesis.   7. Agitated saline contrast bubble study was negative, with no evidence  of any interatrial shunt.   FINDINGS   Left Ventricle: Left ventricular ejection fraction, by estimation, is 60  to 65%. The left ventricle [name] normal function. The left ventricular  internal cavity size was normal in size.   Right Ventricle: The right ventricular size is normal. No increase in  right ventricular wall thickness. Right ventricular systolic function is  normal.   Left Atrium: Left atrial size was normal in size. No left atrial/left  atrial appendage thrombus was detected. The LAA emptying velocity was 54  cm/s.   Right Atrium: Right atrial size was normal in size.   Pericardium: Trivial pericardial effusion is present.   Mitral Valve: The mitral valve is grossly normal. Mild mitral valve  regurgitation. No evidence of mitral valve stenosis. There is no evidence  of  mitral valve vegetation.   Tricuspid Valve: The tricuspid valve is grossly normal. Tricuspid valve  regurgitation is trivial. No evidence of tricuspid stenosis. There is no  evidence of tricuspid valve vegetation.   Aortic Valve: 25 mm Magna Ease in the aortic position. Vmax 2.7 m/s, MG 17  mmHG, EOA 1.72 cm2, DI 0.38 consistent with normal function. No signs of  valvular vegetation. The aortic valve [name] been repaired/replaced. Aortic  valve regurgitation is not  visualized. Aortic valve mean gradient measures 17.0 mmHg. Aortic valve  peak gradient measures 29.8 mmHg. Aortic valve area, by VTI measures 1.72  cm. There is a 25 mm Edwards bioprosthetic valve present in the aortic  position. Procedure Date: 07/02/17.  Echo findings are consistent with normal structure  and function of the  aortic valve prosthesis.   Pulmonic Valve: The pulmonic valve was grossly normal. Pulmonic valve  regurgitation is not visualized. No evidence of pulmonic stenosis. There  is no evidence of pulmonic valve vegetation.   Aorta: 28 mm aortic root graft. The root is thickened more than expected  this far out from surgery. 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  The anterior aspect of the root is not well visualized. Would recommend a  gated cardiac CT to better assess   for aortic root abscess. No signs of valvular vegetation. The aortic  root/ascending aorta [name] been repaired/replaced. There is minimal (Grade  I) layered plaque involving the descending aorta.   IAS/Shunts: No atrial level shunt detected by color flow Doppler. Agitated  saline contrast was given intravenously to evaluate for intracardiac  shunting. Agitated saline contrast bubble study was negative, with no  evidence of any interatrial shunt.      LEFT VENTRICLE  PLAX 2D  LVOT diam:     2.40 cm  LV SV:         76  LV SV Index:   34  LVOT Area:     4.52 cm      AORTIC VALVE  AV Area (Vmax):    1.65 cm  AV Area (Vmean):   1.65 cm  AV Area (VTI):     1.72 cm  AV Vmax:           273.00 cm/s  AV Vmean:          192.000 cm/s  AV VTI:            0.444 m  AV Peak Grad:      29.8 mmHg  AV Mean Grad:      17.0 mmHg  LVOT Vmax:         99.60 cm/s  LVOT Vmean:        70.000 cm/s  LVOT VTI:          0.169 m  LVOT/AV VTI ratio: 0.38      SHUNTS  Systemic VTI:  0.17 m  Systemic Diam: 2.40 cm   Eleonore Chiquito MD  Electronically signed by Eleonore Chiquito MD  Signature Date/Time: 11/01/2021/3:25:56 PM         Final     Assessment/Plan:  This 71 year old gentleman had a biologic Bentall procedure using a 28 mm Valsalva graft and 25 mm Edwards Magna Ease pericardial valve with reimplantation of the coronary arteries and replacement of the ascending aorta up to the arch on 07/04/2017 by  Dr. Darcey [name].  He now presents with fever to 103 with a couple week history of chills and diaphoresis and fatigue with positive blood cultures for strep Mitis/Oralis.  His symptoms improved quickly on intravenous antibiotics.  TEE shows no sign of vegetation on any of the valves and a normally functioning aortic valve prosthesis.  There was no sign of any fistula from the area of the LVOT or the suture line of the aortic valve/graft construct where it is sutured to the annulus.  There was some thickening of the tissues anteriorly to the proximal suture line but no fluid collection.  A gated cardiac CTA was read as showing contrast extravasation circumferentially around the area where the valve/graft construct is anastomosed to the annulus.  There is no significant fluid collection.  I am not yet convinced that there is an abscess around this area.  With this surgery there is circumferential felt pledget material around the annulus superiorly as well as the cuff of the aortic graft which is used to anchor the valve/graft construct to the annulus.  I would think that if there is circumferential leakage of pressurized blood then we would see a significant pseudoaneurysm and we would see flow into this area on the TEE.  He is clinically stable.  We will review the TEE and cardiac CTA with Dr. Darcey [name], Dr. Audie Box and Dr. Johnsie [name] before making further decisions.  I reviewed all this with the patient and his wife and they are in agreement.  All the questions have been answered.  Gaye Pollack 11/03/2021, 1:37 PM

## 2021-11-03 NOTE — Progress Notes (Signed)
[name]ious [facility]  Date of Admission:  10/26/2021   Total days of inpatient antibiotics 8  Principal Problem:   Streptococcal bacteremia Active Problems:   Obstructive sleep apnea   PAF (paroxysmal atrial fibrillation) (HCC)   Chronic anticoagulation   S/P AVR (aortic valve replacement) and aortoplasty   Sepsis (HCC)   BPH (benign prostatic hyperplasia)   AKI (acute kidney injury) ([place])   COVID-19          Assessment: 70 YM with afib on xarelto, aortic aneurysm  SP Bentall procedure(aortic root+aortic root replacement) in 2018, fount have strep mitis/oralis bacteremia suspected 2/2 odontogenic source.    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 CT maxillo facial was unrevealing.  -TEE showed thickened aortic root -CT showed concern of aortic root abscess SP aortic root and valve replacement -Transferred [place] for Ct surgery evaluation -Will plan to treat as prosthetic valve endocarditis Recommendations: -Continue penicillin(anticipate 6 weeks of antibiotics) -Plan on starting gentamicin x 2 weeks for synergy -Needs baseline audiogram -Monitor renal function closely - Follow of CT surgery recommendations  #Recent COVID infection -Dx 10/07/21 SP a course of anitvirals   Microbiology:   Antibiotics: Penicillin 1/11-p Ceftriaxone 1/8-1/10 Cefepime 1/6-7 Metronidazole 1/6-1/8 Vancomycin 1/6-7 Cultures: Blood 10/26/21 strep mitis/oralis 10/28/21 NGTD Urine 10/26/21 insignificant growth Other 10/26/21 RVP negative  SUBJECTIVE: Afebrile overnight. Pt is resting in bed. I called and spoke to wife in regards to starting gentamicin and possible toxicities.   Review of Systems: Review of Systems  All other systems reviewed and are  negative.   Scheduled Meds:  finasteride  5 mg Oral QHS   guaiFENesin  600 mg Oral BID   levothyroxine  112 mcg Oral QAC breakfast   liothyronine  25 mcg Oral QAC breakfast   metoprolol tartrate  12.5 mg Oral BID   mometasone-formoterol  2 puff Inhalation BID   pantoprazole  40 mg Oral BID   psyllium  1 packet Oral Daily   rivaroxaban  20 mg Oral Q supper   tamsulosin  0.4 mg Oral BID   venlafaxine XR  150 mg Oral Q breakfast   zolpidem  5 mg Oral QHS   Continuous Infusions:  penicillin g continuous IV infusion 12 Million Units (11/03/21 0003)   PRN Meds:.acetaminophen **OR** acetaminophen, albuterol, albuterol, ALPRAZolam, azelastine, benzonatate, diphenhydrAMINE, LORazepam, ondansetron **OR** ondansetron (ZOFRAN) IV, sodium chloride, SUMAtriptan Allergies  Allergen Reactions   Perphenazine Swelling    Tongue swelling   Amiodarone Swelling   Codeine Itching   Tramadol Other (See Comments)    dellusion     OBJECTIVE: Vitals:   11/03/21 0010 11/03/21 0413 11/03/21 0814 11/03/21 0849  BP: 125/76 129/85 134/84 129/89  Pulse: 67 70 85 (!) 104  Resp: 18 20 20    Temp: 98.7 F (37.1 C) 98.5 F (36.9 C) 98 F (36.7 C)   TempSrc: Oral Oral Oral   SpO2: 97% 94% 96%   Weight:      Height:       Body mass index is 34.16 kg/m.  Physical Exam Constitutional:      General: He is not in acute distress.    Appearance: He is normal weight. He is not toxic-appearing.  HENT:     Head: Normocephalic and atraumatic.     Right Ear:  External ear normal.     Left Ear: External ear normal.     Nose: No congestion or rhinorrhea.     Mouth/Throat:     Mouth: Mucous membranes are moist.     Pharynx: Oropharynx is clear.  Eyes:     Extraocular Movements: Extraocular movements intact.     Conjunctiva/sclera: Conjunctivae normal.     Pupils: Pupils are equal, round, and reactive to light.  Cardiovascular:     Rate and Rhythm: Normal rate and regular rhythm.     Heart sounds: No  murmur heard.   No friction rub. No gallop.  Pulmonary:     Effort: Pulmonary effort is normal.     Breath sounds: Normal breath sounds.  Abdominal:     General: Abdomen is flat. Bowel sounds are normal.     Palpations: Abdomen is soft.  Musculoskeletal:        General: No swelling. Normal range of motion.     Cervical back: Normal range of motion and neck supple.  Skin:    General: Skin is warm and dry.  Neurological:     General: No focal deficit present.     Mental Status: He is oriented to person, place, and time.  Psychiatric:        Mood and Affect: Mood normal.      Lab Results Lab Results  Component Value Date   WBC 10.1 10/30/2021   HGB 12.9 (L) 10/30/2021   HCT 39.6 10/30/2021   MCV 97.3 10/30/2021   PLT 147 (L) 10/30/2021    Lab Results  Component Value Date   CREATININE 0.99 10/30/2021   BUN 11 10/30/2021   NA 136 10/30/2021   K 4.2 10/30/2021   CL 106 10/30/2021   CO2 26 10/30/2021    Lab Results  Component Value Date   ALT 21 10/27/2021   AST 28 10/27/2021   ALKPHOS 35 (L) 10/27/2021   BILITOT 0.5 10/27/2021        Laurice Record, [place] [facility] 11/03/2021, 12:57 PM

## 2021-11-03 NOTE — Progress Notes (Signed)
PROGRESS NOTE  Grant [name]:[id number] DOB: 04/11/1951 DOA: 10/26/2021 PCP: Shirline Frees, MD  Brief History   71 year old male with a history of atrial fibrillation on Xarelto, recent COVID infection, hypothyroidism, presented with worsening fatigue, decreased p.o. intake, fevers and shaking chills that began approximately 3 to 4 days prior to admission.  He was found to have streptococcal bacteremia.  Source of bacteremia is not entirely clear.  He has been started on IV antibiotics and admitted for further work-up.  Pt went to [facility] today for TEE. Dr. Debbe Mounts note mentions a thickened aortic root raising concern for an aortic root abscess. 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 I have discussed the patient with Dr. Cyndia Bent of CTS  at [facility] The patient will be transferred to [facility] for CTS evaluation.  Consultants  Infectious disease Cardiology  Procedures  None  Antibiotics   Anti-infectives (From admission, onward)    Start     Dose/Rate Route Frequency Ordered Stop   10/30/21 1000  penicillin G potassium 12 Million Units in dextrose 5 % 500 mL continuous infusion        12 Million Units 41.7 mL/hr over 12 Hours Intravenous Every 12 hours 10/29/21 1455     10/27/21 1400  cefTRIAXone (ROCEPHIN) 2 g in sodium chloride 0.9 % 100 mL IVPB  Status:  Discontinued        2 g 200 mL/hr over 30 Minutes Intravenous Every 24 hours 10/27/21 1150 10/29/21 1455   10/27/21 0400  vancomycin (VANCOREADY) IVPB 1250 mg/250 mL  Status:  Discontinued        1,250 mg 166.7 mL/hr over 90 Minutes Intravenous Every 24 hours 10/26/21 1234 10/26/21 1235   10/27/21 0400  vancomycin (VANCOREADY) IVPB 1250 mg/250 mL  Status:  Discontinued        1,250 mg 166.7 mL/hr over 90 Minutes Intravenous Every 24 hours 10/26/21 1235 10/27/21 1149   10/26/21 1800  metroNIDAZOLE (FLAGYL) IVPB 500 mg  Status:  Discontinued        500 mg 100 mL/hr over 60 Minutes Intravenous Every 12  hours 10/26/21 1159 10/27/21 1709   10/26/21 1800  ceFEPIme (MAXIPIME) 2 g in sodium chloride 0.9 % 100 mL IVPB  Status:  Discontinued        2 g 200 mL/hr over 30 Minutes Intravenous Every 12 hours 10/26/21 1221 10/27/21 1149   10/26/21 0630  vancomycin (VANCOCIN) IVPB 1000 mg/200 mL premix        1,000 mg 200 mL/hr over 60 Minutes Intravenous  Once 10/26/21 0533 10/26/21 0737   10/26/21 0530  ceFEPIme (MAXIPIME) 2 g in sodium chloride 0.9 % 100 mL IVPB        2 g 200 mL/hr over 30 Minutes Intravenous  Once 10/26/21 0517 10/26/21 0623   10/26/21 0530  metroNIDAZOLE (FLAGYL) IVPB 500 mg        500 mg 100 mL/hr over 60 Minutes Intravenous  Once 10/26/21 0517 10/26/21 0636   10/26/21 0530  vancomycin (VANCOCIN) IVPB 1000 mg/200 mL premix        1,000 mg 200 mL/hr over 60 Minutes Intravenous  Once 10/26/21 0517 10/26/21 0636      Subjective  The patient is resting quietly. No new complaints.   Objective   Vitals:  Vitals:   11/03/21 0814 11/03/21 0849  BP: 134/84 129/89  Pulse: 85 (!) 104  Resp: 20   Temp: 98 F (36.7 C)   SpO2: 96%  Exam:  Constitutional:  The patient is awake, alert, and oriented x 3. No acute distress. Respiratory:  No increased work of breathing. No wheezes, rales, or rhonchi No tactile fremitus Cardiovascular:  Regular rate and rhythm No murmurs, ectopy, or gallups. No lateral PMI. No thrills. Abdomen:  Abdomen is soft, non-tender, non-distended No hernias, masses, or organomegaly Normoactive bowel sounds.  Musculoskeletal:  No cyanosis, clubbing, or edema Skin:  No rashes, lesions, ulcers palpation of skin: no induration or nodules Neurologic:  CN 2-12 intact Sensation all 4 extremities intact Psychiatric:  Mental status Mood, affect appropriate Orientation to person, place, time  judgment and insight appear intact  I have personally reviewed the following:   Today's Data  Vitals  Lab Data  BMP CBC  Micro Data  Blood  culture x 2: streptococcus mitis/oralis Urine culture: Insignificant growth  Imaging  CXR Maxillofacial CT MR brain without contrast Cardiac CT   Cardiology Data  EKG Echocardiogram TEE  Other Data    Scheduled Meds:  finasteride  5 mg Oral QHS   guaiFENesin  600 mg Oral BID   levothyroxine  112 mcg Oral QAC breakfast   liothyronine  25 mcg Oral QAC breakfast   metoprolol tartrate  12.5 mg Oral BID   mometasone-formoterol  2 puff Inhalation BID   pantoprazole  40 mg Oral BID   psyllium  1 packet Oral Daily   rivaroxaban  20 mg Oral Q supper   tamsulosin  0.4 mg Oral BID   venlafaxine XR  150 mg Oral Q breakfast   zolpidem  5 mg Oral QHS   Continuous Infusions:  penicillin g continuous IV infusion 12 Million Units (11/03/21 0003)    Principal Problem:   Streptococcal bacteremia Active Problems:   Obstructive sleep apnea   PAF (paroxysmal atrial fibrillation) (HCC)   Chronic anticoagulation   S/P AVR (aortic valve replacement) and aortoplasty   Sepsis (HCC)   BPH (benign prostatic hyperplasia)   AKI (acute kidney injury) ([place])   COVID-19   LOS: 8 days   A & P  Sepsis secondary to streptococcal bacteremia -Patient was admitted with fever, tachycardia, acute kidney injury -Source of bacteremia is not entirely clear at this point -CT of chest, abdomen, pelvis did not show any obvious source of infection.  Urinalysis also unremarkable -He has not had any recent skin infections that he is aware of -Currently on ceftriaxone -He had 2 positive sets of blood cultures (both aerobic and anaerobic) positive for gram-positive cocci.  Further identification shows Streptococcus mitis/oralis -Repeat blood cultures drawn on 1/9, no growth as of yet -CT maxillofacial without signs of infection/abscess -MRI brain without any evidence of septic emboli -2D echo without signs of vegetations -cardiology requested for TEE, this will be performed tomorrow -appreciate ID  assistance -Antibiotics changed to penicillin G for 6 weeks and gent 2 weeks -TEE demonstrated a thickened aortic root that raises concerns for an abscess of the aortic root. Cardiac CT ordered.  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   Acute kidney injury -Secondary to sepsis -Renal function has improved with hydration   Paroxysmal atrial fibrillation -Anticoagulated with Xarelto -Chronically on metoprolol which was held on admission due to hypotension -Since blood pressures are improving, he was restarted on metoprolol at reduced dose   Aortic aneurysm status post Bentall procedure -He is status post aortoplasty and AVR with bioprosthetic valve -Last saw CVTS approximately 1 month ago   Recent COVID infection -Was diagnosed 12/19 and completed a  course of molnupiravir -Overall respiratory symptoms have improved -He is outside the window of isolation.  Would not place on airborne precautions   Hypothyroidism -Continue thyroid replacement   Anxiety -Continue As needed benzodiazepines   Obstructive sleep apnea -Continue on CPAP   BPH -Continue Flomax   History of asthmatic bronchitis -Continue inhaled steroids and bronchodilators   Thrombocytopenia -Related to sepsis -Improving -Continue to monitor  Patient and wife with multiple complaints concerning dispersement times of meds.  Charge RN aware and trying to accommodate.  Wife requesting ativan prn for pt, have ordered.    DVT prophylaxis:  rivaroxaban (XARELTO) tablet 20 mg  Code Status: DNR Family Communication: Updated wife at the bedside Disposition Plan: Status is: Inpatient

## 2021-11-03 NOTE — Progress Notes (Addendum)
Pharmacy Antibiotic Note  Grant [name] is a 71 y.o. 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  Pharmacy has been consulted for gentamicin dosing.  Plan: Gentamicin 260 mg q24h (3mg /kg)  Obtain a peak and trough after the 4th dose Monitor renal function and s/sx of toxicity (i.e., ototoxicity)  Height: 5\' 10"  (177.8 cm) Weight: 108 kg (238 lb 1.6 oz) IBW/kg (Calculated) : 73  Temp (24hrs), Avg:98.3 F (36.8 C), Min:98 F (36.7 C), Max:98.7 F (37.1 C)  Recent Labs  Lab 10/28/21 0402 10/30/21 0343  WBC 9.6 10.1  CREATININE 0.80 0.99    Estimated Creatinine Clearance: 87.9 mL/min (by C-G formula based on SCr of 0.99 mg/dL).    Allergies  Allergen Reactions   Perphenazine Swelling    Tongue swelling   Amiodarone Swelling   Codeine Itching   Tramadol Other (See Comments)    dellusion     Antimicrobials this admission: 1/7 Vancomycin >> 1/8 1/7 Cefepime >> 1/8 1/7 Metronidazole >> 1/8 1/8 Ceftriaxone >> 1/10 1/11 PenG >> 1/15 gentamicin >>  Dose adjustments this admission: none  Microbiology results: 1/7 BCx: Strep species in 2/4 bottles: Strep mitis/oralis (sens: Vanc, clinda, PCN, CTX) 1/7 UCx: < 10K - final 1/7 Respiratory panel: neg 1/7 COVID+, Influenza A/B- 1/9 rpt BCx: ngtd  Thank you for allowing pharmacy to be a part of this patients care.  Pauletta Browns 11/03/2021 1:42 PM

## 2021-11-03 NOTE — Progress Notes (Signed)
Pt experiencing left arm discomfort, denies chest pain and shortness of breath. Pt BP 144/97, heart rate 97, O2 98%, RR 19. Pt experiencing hot flash as well. Paged attending. EKG ordered.

## 2021-11-03 NOTE — Progress Notes (Signed)
Patient has home CPAP set up at bedside. Patient states he is able to place himself on without assistance. RT will monitor as needed.

## 2021-11-04 ENCOUNTER — Inpatient Hospital Stay: Payer: Self-pay

## 2021-11-04 ENCOUNTER — Encounter (HOSPITAL_COMMUNITY): Payer: Self-pay | Admitting: Cardiovascular Disease

## 2021-11-04 DIAGNOSIS — R7881 Bacteremia: Secondary | ICD-10-CM | POA: Diagnosis not present

## 2021-11-04 DIAGNOSIS — A409 Streptococcal sepsis, unspecified: Secondary | ICD-10-CM

## 2021-11-04 DIAGNOSIS — B955 Unspecified streptococcus as the cause of diseases classified elsewhere: Secondary | ICD-10-CM | POA: Diagnosis not present

## 2021-11-04 LAB — GLUCOSE, CAPILLARY: Glucose-Capillary: 129 mg/dL — ABNORMAL HIGH (ref 70–99)

## 2021-11-04 LAB — GENTAMICIN LEVEL, RANDOM: Gentamicin Rm: 1.6 ug/mL

## 2021-11-04 MED ORDER — SODIUM CHLORIDE 0.9 % IV SOLN: INTRAVENOUS | Status: DC | PRN

## 2021-11-04 MED ORDER — SODIUM CHLORIDE 0.9% FLUSH: 10.0000 mL | Freq: Two times a day (BID) | INTRAVENOUS | Status: DC

## 2021-11-04 MED ORDER — CHLORHEXIDINE GLUCONATE CLOTH 2 % EX PADS
6.0000 | MEDICATED_PAD | Freq: Every day | CUTANEOUS | Status: DC
  Administered 2021-11-04 – 2021-11-05 (×2): 6 via TOPICAL

## 2021-11-04 MED ORDER — SODIUM CHLORIDE 0.9% FLUSH: 10.0000 mL | INTRAVENOUS | Status: DC | PRN

## 2021-11-04 NOTE — Progress Notes (Signed)
[place] for Infectious Disease  Date of Admission:  10/26/2021   Total days of inpatient antibiotics 8  Principal Problem:   Streptococcal bacteremia Active Problems:   Obstructive sleep apnea   PAF (paroxysmal atrial fibrillation) (HCC)   Chronic anticoagulation   S/P AVR (aortic valve replacement) and aortoplasty   Sepsis (HCC)   BPH (benign prostatic hyperplasia)   AKI (acute kidney injury) ([place])   COVID-19          Assessment: 70 YM with afib on xarelto, aortic aneurysm  SP Bentall procedure(aortic root+aortic root replacement) in 2018, fount have strep mitis/oralis bacteremia suspected 2/2 odontogenic source.    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 CT maxillo facial was unrevealing.  -TEE showed thickened aortic root -CT showed concern of aortic root abscess SP aortic root and valve replacement -Transferred [facility] for Ct surgery evaluation -Will plan to treat as prosthetic valve endocarditis Recommendations: -Continue penicillin(anticipate 6 weeks of antibiotics) -Plan on starting gentamicin x 2 weeks for synergy -Would like to get baseline audiogram to monitor for ototoxicity -Monitor renal function closely -OK to place PICC - Follow of CT surgery recommendations  #Recent COVID infection -Dx 10/07/21 SP a course of anitvirals   Microbiology:   Antibiotics: Penicillin 1/11-p Ceftriaxone 1/8-1/10 Cefepime 1/6-7 Metronidazole 1/6-1/8 Vancomycin 1/6-7 Cultures: Blood 10/26/21 strep mitis/oralis 10/28/21 NGTD Urine 10/26/21 insignificant growth Other 10/26/21 RVP negative  SUBJECTIVE: PT is resting in bed. No new complaints.    Review of Systems: Review of Systems  All other systems reviewed and are negative.   Scheduled Meds:   finasteride  5 mg Oral QHS   guaiFENesin  600 mg Oral BID   levothyroxine  112 mcg Oral QAC breakfast   liothyronine  25 mcg Oral QAC breakfast   metoprolol tartrate  12.5 mg Oral BID   mometasone-formoterol  2 puff Inhalation BID   pantoprazole  40 mg Oral BID   psyllium  1 packet Oral Daily   rivaroxaban  20 mg Oral Q supper   tamsulosin  0.4 mg Oral BID   venlafaxine XR  150 mg Oral Q breakfast   zolpidem  5 mg Oral QHS   Continuous Infusions:  gentamicin Stopped (11/03/21 1652)   penicillin g continuous IV infusion 12 Million Units (11/04/21 0526)   PRN Meds:.acetaminophen **OR** acetaminophen, albuterol, albuterol, ALPRAZolam, azelastine, benzonatate, diphenhydrAMINE, LORazepam, ondansetron **OR** ondansetron (ZOFRAN) IV, sodium chloride, SUMAtriptan Allergies  Allergen Reactions   Perphenazine Swelling    Tongue swelling   Amiodarone Swelling   Codeine Itching   Tramadol Other (See Comments)    dellusion     OBJECTIVE: Vitals:   11/04/21 0018 11/04/21 0513 11/04/21 0836 11/04/21 0908  BP: 136/88 (!) 146/90  133/83  Pulse: 85 80 96 (!) 113  Resp: 16 16 19 16   Temp: 98.1 F (36.7 C) 98.4 F (36.9 C)  97.8 F (36.6 C)  TempSrc: Oral Oral  Oral  SpO2: 95% 97% 97% 97%  Weight:      Height:       Body mass index is 34.16 kg/m.  Physical Exam Constitutional:      General: He is not in acute distress.    Appearance: He is normal weight. He is not toxic-appearing.  HENT:     Head: Normocephalic and atraumatic.  Right Ear: External ear normal.     Left Ear: External ear normal.     Nose: No congestion or rhinorrhea.     Mouth/Throat:     Mouth: Mucous membranes are moist.     Pharynx: Oropharynx is clear.  Eyes:     Extraocular Movements: Extraocular movements intact.     Conjunctiva/sclera: Conjunctivae normal.     Pupils: Pupils are equal, round, and reactive to light.  Cardiovascular:     Rate and Rhythm: Normal rate and regular rhythm.     Heart  sounds: No murmur heard.   No friction rub. No gallop.  Pulmonary:     Effort: Pulmonary effort is normal.     Breath sounds: Normal breath sounds.  Abdominal:     General: Abdomen is flat. Bowel sounds are normal.     Palpations: Abdomen is soft.  Musculoskeletal:        General: No swelling. Normal range of motion.     Cervical back: Normal range of motion and neck supple.  Skin:    General: Skin is warm and dry.  Neurological:     General: No focal deficit present.     Mental Status: He is oriented to person, place, and time.  Psychiatric:        Mood and Affect: Mood normal.      Lab Results Lab Results  Component Value Date   WBC 10.1 10/30/2021   HGB 12.9 (L) 10/30/2021   HCT 39.6 10/30/2021   MCV 97.3 10/30/2021   PLT 147 (L) 10/30/2021    Lab Results  Component Value Date   CREATININE 0.99 10/30/2021   BUN 11 10/30/2021   NA 136 10/30/2021   K 4.2 10/30/2021   CL 106 10/30/2021   CO2 26 10/30/2021    Lab Results  Component Value Date   ALT 21 10/27/2021   AST 28 10/27/2021   ALKPHOS 35 (L) 10/27/2021   BILITOT 0.5 10/27/2021        Laurice Record, [place] [facility] Infect[facility] 11/04/2021, 2:01 PM

## 2021-11-04 NOTE — Progress Notes (Signed)
PROGRESS NOTE  Grant [name] BHA:[id number] DOB: [birth year] DOA: 10/26/2021 PCP: Shirline Frees, MD  Brief History   71 year old male with a history of atrial fibrillation on Xarelto, recent COVID infection, hypothyroidism, presented with worsening fatigue, decreased p.o. intake, fevers and shaking chills that began approximately 3 to 4 days prior to admission.  He was found to have streptococcal bacteremia.  Source of bacteremia is not entirely clear.  He has been started on IV antibiotics and admitted for further work-up.  Pt went to [facility] today for TEE. Dr. Debbe Mounts note mentions a thickened aortic root raising concern for an aortic root abscess. 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 I have discussed the patient with Dr. Cyndia Bent of CTS  at [facility]. The patient will be transferred to [facility] for CTS evaluation.    Consultants  Infectious disease Cardiology  Procedures  None  Antibiotics   Anti-infectives (From admission, onward)    Start     Dose/Rate Route Frequency Ordered Stop   11/03/21 1530  gentamicin (GARAMYCIN) 260 mg in dextrose 5 % 50 mL IVPB        3 mg/kg  87 kg (Adjusted) 113 mL/hr over 30 Minutes Intravenous Every 24 hours 11/03/21 1430     11/03/21 1430  gentamicin (GARAMYCIN) IVPB 80 mg  Status:  Discontinued        80 mg 100 mL/hr over 30 Minutes Intravenous Every 8 hours 11/03/21 1341 11/03/21 1427   11/03/21 1400  gentamicin (GARAMYCIN) 440 mg in dextrose 5 % 100 mL IVPB  Status:  Discontinued        5 mg/kg  87 kg (Adjusted) 111 mL/hr over 60 Minutes Intravenous Every 24 hours 11/03/21 1313 11/03/21 1315   10/30/21 1000  penicillin G potassium 12 Million Units in dextrose 5 % 500 mL continuous infusion        12 Million Units 41.7 mL/hr over 12 Hours Intravenous Every 12 hours 10/29/21 1455     10/27/21 1400  cefTRIAXone (ROCEPHIN) 2 g in sodium chloride 0.9 % 100 mL IVPB  Status:  Discontinued        2 g 200 mL/hr over 30  Minutes Intravenous Every 24 hours 10/27/21 1150 10/29/21 1455   10/27/21 0400  vancomycin (VANCOREADY) IVPB 1250 mg/250 mL  Status:  Discontinued        1,250 mg 166.7 mL/hr over 90 Minutes Intravenous Every 24 hours 10/26/21 1234 10/26/21 1235   10/27/21 0400  vancomycin (VANCOREADY) IVPB 1250 mg/250 mL  Status:  Discontinued        1,250 mg 166.7 mL/hr over 90 Minutes Intravenous Every 24 hours 10/26/21 1235 10/27/21 1149   10/26/21 1800  metroNIDAZOLE (FLAGYL) IVPB 500 mg  Status:  Discontinued        500 mg 100 mL/hr over 60 Minutes Intravenous Every 12 hours 10/26/21 1159 10/27/21 1709   10/26/21 1800  ceFEPIme (MAXIPIME) 2 g in sodium chloride 0.9 % 100 mL IVPB  Status:  Discontinued        2 g 200 mL/hr over 30 Minutes Intravenous Every 12 hours 10/26/21 1221 10/27/21 1149   10/26/21 0630  vancomycin (VANCOCIN) IVPB 1000 mg/200 mL premix        1,000 mg 200 mL/hr over 60 Minutes Intravenous  Once 10/26/21 0533 10/26/21 0737   10/26/21 0530  ceFEPIme (MAXIPIME) 2 g in sodium chloride 0.9 % 100 mL IVPB        2 g 200 mL/hr over  30 Minutes Intravenous  Once 10/26/21 0517 10/26/21 0623   10/26/21 0530  metroNIDAZOLE (FLAGYL) IVPB 500 mg        500 mg 100 mL/hr over 60 Minutes Intravenous  Once 10/26/21 0517 10/26/21 0636   10/26/21 0530  vancomycin (VANCOCIN) IVPB 1000 mg/200 mL premix        1,000 mg 200 mL/hr over 60 Minutes Intravenous  Once 10/26/21 0517 10/26/21 0636      Subjective  patient feels Ativan has been helping    Objective   Vitals:  Vitals:   11/04/21 0836 11/04/21 0908  BP:  133/83  Pulse: 96 (!) 113  Resp: 19 16  Temp:  97.8 F (36.6 C)  SpO2: 97% 97%    Exam:  Constitutional:  The patient is awake, alert, and oriented x 3. No acute distress. Respiratory:  No increased work of breathing. No wheezes, rales, or rhonchi No tactile fremitus Cardiovascular:  Regular rate and rhythm No murmurs, ectopy, or gallups. No lateral PMI. No  thrills. Abdomen:  Abdomen is soft, non-tender, non-distended No hernias, masses, or organomegaly Normoactive bowel sounds.  Musculoskeletal:  No cyanosis, clubbing, or edema Skin:  No rashes, lesions, ulcers palpation of skin: no induration or nodules Neurologic:  CN 2-12 intact Sensation all 4 extremities intact Psychiatric:  Mental status Mood, affect appropriate Orientation to person, place, time  judgment and insight appear intact  I have personally reviewed the following:   Today's Data  Vitals  Lab Data  BMP CBC  Micro Data  Blood culture x 2: streptococcus mitis/oralis Urine culture: Insignificant growth  Imaging  CXR Maxillofacial CT MR brain without contrast Cardiac CT   Cardiology Data  EKG Echocardiogram TEE  Other Data    Scheduled Meds:  finasteride  5 mg Oral QHS   guaiFENesin  600 mg Oral BID   levothyroxine  112 mcg Oral QAC breakfast   liothyronine  25 mcg Oral QAC breakfast   metoprolol tartrate  12.5 mg Oral BID   mometasone-formoterol  2 puff Inhalation BID   pantoprazole  40 mg Oral BID   psyllium  1 packet Oral Daily   rivaroxaban  20 mg Oral Q supper   tamsulosin  0.4 mg Oral BID   venlafaxine XR  150 mg Oral Q breakfast   zolpidem  5 mg Oral QHS   Continuous Infusions:  gentamicin Stopped (11/03/21 1652)   penicillin g continuous IV infusion 12 Million Units (11/04/21 0526)    Principal Problem:   Streptococcal bacteremia Active Problems:   Obstructive sleep apnea   PAF (paroxysmal atrial fibrillation) (HCC)   Chronic anticoagulation   S/P AVR (aortic valve replacement) and aortoplasty   Sepsis (HCC)   BPH (benign prostatic hyperplasia)   AKI (acute kidney injury) ([place])   COVID-19   LOS: 9 days   A & P  Sepsis secondary to streptococcal bacteremia -Patient was admitted with fever, tachycardia, acute kidney injury -Source of bacteremia is not entirely clear at this point -CT of chest, abdomen, pelvis did not  show any obvious source of infection.  Urinalysis also unremarkable -He has not had any recent skin infections that he is aware of -Currently on ceftriaxone -He had 2 positive sets of blood cultures (both aerobic and anaerobic) positive for gram-positive cocci.  Further identification shows Streptococcus mitis/oralis -Repeat blood cultures drawn on 1/9, no growth as of yet -CT maxillofacial without signs of infection/abscess -MRI brain without any evidence of septic emboli -2D echo without signs of  vegetations -cardiology requested for TEE, this will be performed tomorrow -appreciate ID assistance -Antibiotics changed to penicillin G for 6 weeks and gent 2 weeks -TEE demonstrated a thickened aortic root that raises concerns for an abscess of the aortic root. Cardiac CT ordered. 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   Acute kidney injury -Secondary to sepsis -Renal function has improved with hydration   Paroxysmal atrial fibrillation -Anticoagulated with Xarelto -Chronically on metoprolol which was held on admission due to hypotension -Since blood pressures are improving, he was restarted on metoprolol at reduced dose   Aortic aneurysm status post Bentall procedure -He is status post aortoplasty and AVR with bioprosthetic valve -Last saw CVTS approximately 1 month ago   Recent COVID infection -Was diagnosed 12/19 and completed a course of molnupiravir -Overall respiratory symptoms have improved -He is outside the window of isolation.  Would not place on airborne precautions   Hypothyroidism -Continue thyroid replacement   Anxiety -Continue As needed benzodiazepines   Obstructive sleep apnea -Continue on CPAP   BPH -Continue Flomax   History of asthmatic bronchitis -Continue inhaled steroids and bronchodilators   Thrombocytopenia -Related to sepsis -Improving -Continue to monitor  Patient will need PICC line  for 6 weeks for antibiotic treatment.  Order PICC line today IV team.  Awaiting final recommendations by CT surgery but sounds like will be nonoperative.  If plan is nonoperative patient will be able to be discharged with antibiotics arranged as an outpatient.   DVT prophylaxis:  rivaroxaban (XARELTO) tablet 20 mg  Code Status: DNR Family Communication: Updated wife at the bedside Disposition Plan: Status is: Inpatient      Patient ID: Grant [name], male   DOB: 1950/11/10, 71 y.o.   MRN: [id number]

## 2021-11-04 NOTE — Progress Notes (Addendum)
RT Note:  Patient resting on home CPAP unit. No assistance needed from RT  at this time.

## 2021-11-04 NOTE — TOC Initial Note (Signed)
Transition of Care [facility]) - Initial/Assessment Note    Patient Details  Name: Grant [name] MRN: [id number] Date of Birth: 05-23-1951  Transition of Care [facility]) CM/SW Contact:    Bethena Roys, RN Phone Number: 11/04/2021, 12:26 PM  Clinical Narrative:  Patient is a transfer from [facility]. Prior to arrival patient was from home with the support of his spouse. Spouse reports that the patient was independent prior to hospitalization. Case Manager was reviewing the chart and patient is in need of IV Penicillin antibiotics for six weeks. Case Manager spoke with patient and spouse. Case Manager then called [facility] and they will assist in IV antibiotic therapy. Spouse has Medicare.gov list to choose an agency of choice. Spouse will call Case Manager back with choice. Awaiting phone call. Per notes, patient will get a PICC Line placed today. Staff RN is checking IV team schedule.   Expected Discharge Plan: [place] Barriers to Discharge: Continued Medical Work up   Patient Goals and CMS Choice Patient states their goals for this hospitalization and ongoing recovery are:: to return home CMS Medicare.gov Compare Post Acute Care list provided to:: Patient Choice offered to / list presented to : Spouse  Expected Discharge Plan and Services Expected Discharge Plan: [place] In-house Referral: NA Discharge Planning Services: CM Consult Post Acute Care Choice: Home Health, Durable Medical Equipment Living arrangements for the past 2 months: Single Family Home                 DME Arranged: IV pump/equipment ([facility])   Date DME Agency Contacted: 11/04/21 Time DME Agency Contacted: 32 Representative spoke with at DME Agency: [facility] HH Arranged: RN, Disease Management          Prior Living Arrangements/Services Living arrangements for the past 2 months: [place] with:: Spouse Patient language and need for  interpreter reviewed:: Yes Do you feel safe going back to the place where you live?: Yes      Need for Family Participation in Patient Care: Yes (Comment) Care giver support system in place?: Yes (comment)   Criminal Activity/Legal Involvement Pertinent to Current Situation/Hospitalization: No - Comment as needed  Activities of Daily Living Home Assistive Devices/Equipment: None ADL Screening (condition at time of admission) Patient's cognitive ability adequate to safely complete daily activities?: Yes Is the patient deaf or have difficulty hearing?: No Does the patient have difficulty seeing, even when wearing glasses/contacts?: No Does the patient have difficulty concentrating, remembering, or making decisions?: No Patient able to express need for assistance with ADLs?: Yes Does the patient have difficulty dressing or bathing?: No Independently performs ADLs?: Yes (appropriate for developmental age) Does the patient have difficulty walking or climbing stairs?: No Weakness of Legs: None Weakness of Arms/Hands: None  Permission Sought/Granted Permission sought to share information with : Family Supports, Customer service manager, Case Optician, dispensing granted to share information with : Yes, Verbal Permission Granted     Permission granted to share info w AGENCY: [facility]/        Emotional Assessment Appearance:: Appears stated age Attitude/Demeanor/Rapport: Engaged Affect (typically observed): Appropriate Orientation: : Oriented to Situation, Oriented to  Time, Oriented to Place, Oriented to Self Alcohol / Substance Use: Not Applicable    Admission diagnosis:  AKI (acute kidney injury) ([place]) [N17.9] Septic shock ([place]) [A41.9, R65.21] Sepsis ([place]) [A41.9] COVID-19 [U07.1] Patient Active Problem List   Diagnosis Date Noted   COVID-19    AKI (acute  kidney injury) ([place]) 10/27/2021   Streptococcal bacteremia 10/27/2021   Hypomagnesemia 06/13/2021   Hypokalemia  06/13/2021   Colonic diverticular abscess 06/13/2021   Stricture of sigmoid colon ([place]) 06/12/2021   Anxiety 03/19/2021   Diverticulitis of colon 03/19/2021   Gout 03/19/2021   Hardening of the aorta (main artery of the heart) ([place]) 03/19/2021   Hypercoagulable state ([place]) 03/19/2021   Iron deficiency anemia 03/19/2021   Migraine 03/19/2021   Mixed hyperlipidemia 03/19/2021   Personal history of colonic polyps 03/19/2021   Recurrent major depression in remission ([place]) 03/19/2021   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   Acute diverticulitis 10/10/2020   Bilateral shoulder pain 10/26/2019   Morbid obesity due to excess calories ([place]) complicated by HBP, OSA/ gerd  07/07/2019   Upper airway cough syndrome 03/09/2019   Confusion with non-focal neuro exam 02/16/2019   Cough variant asthma vs UACS 11/15/2018   Cough 10/27/2018   Angioedema 03/07/2018   Sepsis ([place]) 03/07/2018   Asthma 03/07/2018   Hypothyroidism 03/07/2018   BPH (benign prostatic hyperplasia) 03/07/2018   Pain in joint of left shoulder 01/26/2018   Allergic reaction 01/21/2018   Atrial fibrillation with RVR ([place]) 07/28/2017   S/P AVR (aortic valve replacement) and aortoplasty 07/02/2017   Eczema 01/01/2014   Hoarseness or changing voice 07/24/2013   PAF (paroxysmal atrial fibrillation) ([place]) 05/18/2013   Chronic anticoagulation 05/18/2013   Preop cardiovascular exam 05/18/2013   Macular rash 08/10/2012   Acute asthmatic bronchitis 05/05/2012   Essential hypertension 06/17/2010   SYNCOPE 06/14/2010   DEPRESSION 02/11/2008   Obstructive sleep apnea 02/11/2008   Seasonal and perennial allergic rhinitis 02/11/2008   Chronic bronchitis ([place]) 02/11/2008   GERD (gastroesophageal reflux disease) 02/11/2008   PCP:  Shirline Frees, MD Pharmacy:   [facility] [place], [address] AT [facility] OF [address] [place] [address] Phone: [phone] Fax:  [phone]    Readmission Risk Interventions No flowsheet data found.

## 2021-11-04 NOTE — Progress Notes (Addendum)
Request received for PICC line placement.   Chart reviewed, was not able to find documentation from IV team stating this patient need IR for PICC line placement.   Discussed with ordering MD, patient need regular PICC line not tunneled CVC.  Asked MD to contact IV team for PICC placement.  Will delete IR PICC placement order. Please re-order IR PICC placement or contact IR if IV team cannot place PICC line.    Armando Gang Donaldson Richter PA-C 11/04/2021 11:38 AM

## 2021-11-04 NOTE — Progress Notes (Signed)
3 Days Post-Op Procedure(s) (LRB): TRANSESOPHAGEAL ECHOCARDIOGRAM (TEE) (N/A) BUBBLE STUDY Subjective\  Patient feels better and is waiting for his PICC line for outpatient IV antibiotics.  I examined the patient after reviewing his cardiac CT scan.  I agree with the opinion that the aortic root and aortic valve are not involved with an infectious process and that the radiopaque signal seen are due to surgical pledgets placed at the time of surgery which were also noted on the postoperative CTA performed in 2019.  Reviewed with plans for long-term IV antibiotics.  I told the patient he should not have any more dental procedures performed for 6 months and I will follow him back in the office in approximately 3 months for monitoring. Objective: Vital signs in last 24 hours: Temp:  [97.8 F (36.6 C)-99.1 F (37.3 C)] 97.8 F (36.6 C) (01/16 0908) Pulse Rate:  [80-113] 92 (01/16 1514) Cardiac Rhythm: Other (Comment) (01/16 0909) Resp:  [16-19] 18 (01/16 1514) BP: (133-146)/(83-92) 136/92 (01/16 1514) SpO2:  [93 %-97 %] 93 % (01/16 1514)  Hemodynamic parameters for last 24 hours:    Intake/Output from previous day: 01/15 0701 - 01/16 0700 In: 56.5 [IV Piggyback:56.5] Out: 3550 [Urine:3550] Intake/Output this shift: Total I/O In: 887.2 [P.O.:480; I.V.:80; IV Piggyback:327.2] Out: 1000 [Urine:1000]  Exam Alert and oriented No cardiac murmur Regular rhythm  Lab Results: No results for input(s): WBC, HGB, HCT, PLT in the last 72 hours. BMET: No results for input(s): NA, K, CL, CO2, GLUCOSE, BUN, CREATININE, CALCIUM in the last 72 hours.  PT/INR: No results for input(s): LABPROT, INR in the last 72 hours. ABG    Component Value Date/Time   PHART 7.458 (H) 07/03/2017 1846   HCO3 27.0 07/03/2017 1846   TCO2 26 07/07/2017 1627   ACIDBASEDEF 6.0 (H) 07/03/2017 0627   O2SAT 57.8 07/08/2017 0330   CBG (last 3)  Recent Labs    11/03/21 2117 11/04/21 0752  GLUCAP 107* 129*     Assessment/Plan: S/P Procedure(s) (LRB): TRANSESOPHAGEAL ECHOCARDIOGRAM (TEE) (N/A) BUBBLE STUDY Positive blood cultures with Streptococcus species elated to probable dental work but no evidence of involvement of the biologic Bentall aortic root replacement.  I will follow-up with the patient in my office.   LOS: 9 days    [name] 11/04/2021

## 2021-11-04 NOTE — Progress Notes (Signed)
Peripherally Inserted Central Catheter Placement  The IV Nurse has discussed with the patient and/or persons authorized to consent for the patient, the purpose of this procedure and the potential benefits and risks involved with this procedure.  The benefits include less needle sticks, lab draws from the catheter, and the patient may be discharged home with the catheter. Risks include, but not limited to, infection, bleeding, blood clot (thrombus formation), and puncture of an artery; nerve damage and irregular heartbeat and possibility to perform a PICC exchange if needed/ordered by physician.  Alternatives to this procedure were also discussed.  Bard Power PICC patient education guide, fact sheet on infection prevention and patient information card has been provided to patient /or left at bedside.  Consent signed by wife at patient request.  PICC Placement Documentation  PICC Double Lumen 41/03/01 PICC Right Basilic 41 cm 0 cm (Active)  Indication for Insertion or Continuance of Line Prolonged intravenous therapies;Home intravenous therapies (PICC only) 11/04/21 1754  Exposed Catheter (cm) 0 cm 11/04/21 1754  Site Assessment Clean;Dry;Intact 11/04/21 1754  Lumen #1 Status Flushed;Saline locked;Blood return noted 11/04/21 1754  Lumen #2 Status Flushed;Saline locked;Blood return noted 11/04/21 1754  Dressing Type Transparent 11/04/21 1754  Dressing Status Clean;Dry;Intact 11/04/21 1754  Antimicrobial disc in place? Yes 11/04/21 1754  Safety Lock Not Applicable 31/43/88 8757  Line Care Connections checked and tightened 11/04/21 1754  Line Adjustment (NICU/IV Team Only) No 11/04/21 1754  Dressing Intervention New dressing 11/04/21 1754  Dressing Change Due 11/11/21 11/04/21 1754       [name], Nicolette Bang 11/04/2021, 5:56 PM

## 2021-11-05 DIAGNOSIS — R7881 Bacteremia: Secondary | ICD-10-CM | POA: Diagnosis not present

## 2021-11-05 DIAGNOSIS — B955 Unspecified streptococcus as the cause of diseases classified elsewhere: Secondary | ICD-10-CM | POA: Diagnosis not present

## 2021-11-05 LAB — BASIC METABOLIC PANEL
Anion gap: 8 (ref 5–15)
BUN: 13 mg/dL (ref 8–23)
CO2: 26 mmol/L (ref 22–32)
Calcium: 8.9 mg/dL (ref 8.9–10.3)
Chloride: 104 mmol/L (ref 98–111)
Creatinine, Ser: 0.96 mg/dL (ref 0.61–1.24)
GFR, Estimated: >60 mL/min (ref >60)
Glucose, Bld: 115 mg/dL — ABNORMAL HIGH (ref 70–99)
Potassium: 4.3 mmol/L (ref 3.5–5.1)
Sodium: 138 mmol/L (ref 135–145)

## 2021-11-05 LAB — CBC
HCT: 43.9 % (ref 39.0–52.0)
Hemoglobin: 15.1 g/dL (ref 13.0–17.0)
MCH: 32 pg (ref 26.0–34.0)
MCHC: 34.4 g/dL (ref 30.0–36.0)
MCV: 93 fL (ref 80.0–100.0)
Platelets: 276 K/uL (ref 150–400)
RBC: 4.72 MIL/uL (ref 4.22–5.81)
RDW: 12.5 % (ref 11.5–15.5)
WBC: 11.4 K/uL — ABNORMAL HIGH (ref 4.0–10.5)
nRBC: 0 % (ref 0.0–0.2)

## 2021-11-05 MED ORDER — DEXTROSE 5 % IV SOLN: 3.0000 mg/kg | INTRAVENOUS | 0 refills | Status: DC

## 2021-11-05 MED ORDER — GENTAMICIN IV (FOR PTA / DISCHARGE USE ONLY): 260.0000 mg | INTRAVENOUS | 0 refills | Status: DC

## 2021-11-05 MED ORDER — PENICILLIN G POTASSIUM IV (FOR PTA / DISCHARGE USE ONLY): 24.0000 10*6.[IU] | INTRAVENOUS | 0 refills | Status: DC

## 2021-11-05 MED ORDER — PENICILLIN G POTASSIUM 20000000 UNITS IJ SOLR: 12.0000 10*6.[IU] | Freq: Two times a day (BID) | INTRAVENOUS | 0 refills | Status: DC

## 2021-11-05 NOTE — Progress Notes (Signed)
PHARMACY CONSULT NOTE FOR:  OUTPATIENT  PARENTERAL ANTIBIOTIC THERAPY (OPAT)  Indication: bacteremia/PVIE Regimen/End date:  gentamicin 260 mg IV q24h through 11/16/21 penicillin G 24 million units IV q24h as continuous infusion through 12/08/21   IV antibiotic discharge orders are pended. To discharging provider:  please sign these orders via discharge navigator,  Select New Orders & click on the button choice - Manage This Unsigned Work.     Thank you for allowing pharmacy to be a part of this patient's care.  Eliseo Gum, PharmD Candidate  11/05/2021 10:22 AM

## 2021-11-05 NOTE — Progress Notes (Signed)
Told in report about nurse manager having meeting with patient and wife on dayshift. Wife left notes for nursing staff to better care for patient by not overstimulating and causing anxiety with patient's chronic fatigue syndrome. At beginning of shift made a plan with patient. Agreed to take all his evening meds at once and be left completely alone by staff unless he called. Does not want to be woken up at all. Through the night staff has only gone in so far to stop a beeping IV pole and stop cardiac monitoring from alarming when patient accidentally pulled all leads off. Will continue to limit interruptions. Patient agreed to morning labs from phlebotomy and to take his morning dose of thyroid medication if awake.

## 2021-11-05 NOTE — Progress Notes (Addendum)
[place] for Infectious Disease  Date of Admission:  10/26/2021   Total days of inpatient antibiotics 8  Principal Problem:   Streptococcal bacteremia Active Problems:   Obstructive sleep apnea   PAF (paroxysmal atrial fibrillation) (HCC)   Chronic anticoagulation   S/P AVR (aortic valve replacement) and aortoplasty   Sepsis (HCC)   BPH (benign prostatic hyperplasia)   AKI (acute kidney injury) ([place])   COVID-19          Assessment: 70 YM with afib on xarelto, aortic aneurysm  SP Bentall procedure(aortic root+aortic root replacement) in 2018, fount have strep mitis/oralis bacteremia suspected 2/2 odontogenic source.    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 CT maxillo facial was unrevealing.  -TEE showed thickened aortic root -CT showed concern of aortic root abscess SP aortic root and valve replacement -Transferred [place] for Ct surgery evaluation. No plans for intervention as noted that radiopaque signal are due to surgical pledgets placed at time of surgery(noted on CTA in 2019)and not infectious process.  -Will plan to treat as prosthetic valve endocarditis given bacteremia.  Recommendations: -Continue penicillin(anticipate 6 weeks of antibiotics from negative Cx EOT 12/08/21) -gentamicin x 2 weeks for synergy(EOT 11/16/21) -Would like to get baseline audiogram to monitor for ototoxicity -Agree with CT srugery follow-up -Follow-up in ID clinic  #Recent COVID infection -Dx 10/07/21 SP a course of anitvirals   OPAT ORDERS:  Diagnosis: Prosthetic aortic root abscess  Culture Result: BCX+ strep mitis/oralis  Allergies  Allergen Reactions   Perphenazine Swelling    Tongue swelling   Amiodarone Swelling   Codeine Itching   Tramadol Other  (See Comments)    dellusion      Discharge antibiotics to be given via PICC line:  Antibiotics: penicillin, gentamicin  Duration: Gent x 2 weeks Penicillin x 6 weeks End Date: penicillin(anticipate 6 weeks of antibiotics from negative Cx EOT 12/08/21) -gentamicin x 2 weeks for synergy(EOT 11/16/21)  PIC Care Per Protocol with Biopatch Use: Home health RN for IV administration and teaching, line care and labs.    Labs weekly while on IV antibiotics: __ CBC with differential __ CMP __ CRP __ ESR __Twice a week gent troughs  __ Please pull PIC at completion of IV antibiotics Fax weekly labs to [phone]  Clinic Follow Up Appt: 11/26/21 at 10:30 AM  @ [facility] with Dr. Candiss Norse   Microbiology:   Antibiotics: Penicillin 1/11-p Ceftriaxone 1/8-1/10 Cefepime 1/6-7 Metronidazole 1/6-1/8 Vancomycin 1/6-7 Cultures: Blood 10/26/21 strep mitis/oralis 10/28/21 NGTD Urine 10/26/21 insignificant growth Other 10/26/21 RVP negative  SUBJECTIVE: Pt resting in bed. Denies any new complaints.   Review of Systems: Review of Systems  All other systems reviewed and are negative.   Scheduled Meds:  Chlorhexidine Gluconate Cloth  6 each Topical Daily   finasteride  5 mg Oral QHS   guaiFENesin  600 mg Oral BID   levothyroxine  112 mcg Oral QAC breakfast   liothyronine  25 mcg Oral QAC breakfast   metoprolol tartrate  12.5 mg Oral BID   mometasone-formoterol  2 puff Inhalation BID   pantoprazole  40 mg Oral BID   psyllium  1 packet Oral Daily   rivaroxaban  20 mg Oral Q supper   sodium chloride flush  10-40 mL Intracatheter Q12H   tamsulosin  0.4 mg Oral BID   venlafaxine XR  150 mg Oral Q breakfast   zolpidem  5 mg Oral QHS   Continuous Infusions:  sodium chloride 10 mL/hr at 11/04/21 1511   gentamicin Stopped (11/04/21 1547)   penicillin g continuous IV infusion 12 Million Units (11/05/21 0851)   PRN Meds:.sodium chloride, acetaminophen **OR** acetaminophen, albuterol,  albuterol, ALPRAZolam, azelastine, benzonatate, diphenhydrAMINE, LORazepam, ondansetron **OR** ondansetron (ZOFRAN) IV, sodium chloride, sodium chloride flush, SUMAtriptan Allergies  Allergen Reactions   Perphenazine Swelling    Tongue swelling   Amiodarone Swelling   Codeine Itching   Tramadol Other (See Comments)    dellusion     OBJECTIVE: Vitals:   11/04/21 2100 11/05/21 0448 11/05/21 0748 11/05/21 0850  BP:  122/84  121/89  Pulse: 91   87  Resp: 16 16    Temp:  97.9 F (36.6 C)    TempSrc:  Oral    SpO2: 96% 97% 97%   Weight:      Height:       Body mass index is 34.16 kg/m.  Physical Exam Constitutional:      General: He is not in acute distress.    Appearance: He is normal weight. He is not toxic-appearing.  HENT:     Head: Normocephalic and atraumatic.     Right Ear: External ear normal.     Left Ear: External ear normal.     Nose: No congestion or rhinorrhea.     Mouth/Throat:     Mouth: Mucous membranes are moist.     Pharynx: Oropharynx is clear.  Eyes:     Extraocular Movements: Extraocular movements intact.     Conjunctiva/sclera: Conjunctivae normal.     Pupils: Pupils are equal, round, and reactive to light.  Cardiovascular:     Rate and Rhythm: Normal rate and regular rhythm.     Heart sounds: No murmur heard.   No friction rub. No gallop.  Pulmonary:     Effort: Pulmonary effort is normal.     Breath sounds: Normal breath sounds.  Abdominal:     General: Abdomen is flat. Bowel sounds are normal.     Palpations: Abdomen is soft.  Musculoskeletal:        General: No swelling. Normal range of motion.     Cervical back: Normal range of motion and neck supple.  Skin:    General: Skin is warm and dry.  Neurological:     General: No focal deficit present.     Mental Status: He is oriented to person, place, and time.  Psychiatric:        Mood and Affect: Mood normal.      Lab Results Lab Results  Component Value Date   WBC 11.4 (H)  11/05/2021   HGB 15.1 11/05/2021   HCT 43.9 11/05/2021   MCV 93.0 11/05/2021   PLT 276 11/05/2021    Lab Results  Component Value Date   CREATININE 0.96 11/05/2021   BUN 13 11/05/2021   NA 138 11/05/2021   K 4.3 11/05/2021   CL 104 11/05/2021   CO2 26 11/05/2021    Lab Results  Component Value Date   ALT 21 10/27/2021   AST 28 10/27/2021   ALKPHOS 35 (L) 10/27/2021   BILITOT 0.5 10/27/2021        Laurice Record, [place] for Infectious Disease [facility] 11/05/2021, 9:00 AM

## 2021-11-05 NOTE — Progress Notes (Signed)
Pharmacy Antibiotic Note  Grant [name] is a 71 y.o. 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  Pharmacy has been consulted for gentamicin dosing.  A gentamicin random was drawn today to assess for toxicity prior to discharge. The level was drawn about 2 hours early and resulted as <0.5, which is appropriate for synergy. Gentamicin troughs will continue to be checked by home health twice a week at home.   Plan: Continue Gentamicin 260 mg q24h (3mg /kg) for 2 weeks through 1/28  Monitor renal function and s/sx of toxicity (i.e., ototoxicity)  Height: 5\' 10"  (177.8 cm) Weight: 108 kg (238 lb 1.6 oz) IBW/kg (Calculated) : 73  Temp (24hrs), Avg:97.9 F (36.6 C), Min:97.8 F (36.6 C), Max:97.9 F (36.6 C)  Recent Labs  Lab 10/30/21 0343 11/04/21 0238 11/05/21 0509 11/05/21 1252  WBC 10.1  --  11.4*  --   CREATININE 0.99  --  0.96  --   GENTRANDOM  --  1.6  --  <0.5     Estimated Creatinine Clearance: 90.6 mL/min (by C-G formula based on SCr of 0.96 mg/dL).    Allergies  Allergen Reactions   Perphenazine Swelling    Tongue swelling   Amiodarone Swelling   Codeine Itching   Tramadol Other (See Comments)    dellusion     Antimicrobials this admission: 1/7 Vancomycin >> 1/8 1/7 Cefepime >> 1/8 1/7 Metronidazole >> 1/8 1/8 Ceftriaxone >> 1/10 1/11 PenG >>(2/19) 1/15 gentamicin >>(1/28)  Dose adjustments this admission: 1/17 Gent random: <0.5 - Continue gentamicin 260 mg every 24 hours    Thank you for allowing pharmacy to be a part of this patients care. Jimmy Footman, PharmD, BCPS, BCIDP Infectious Diseases Clinical Pharmacist Phone: [phone] 11/05/2021 2:30 PM

## 2021-11-05 NOTE — Care Management Important Message (Signed)
Important Message  Patient Details  Name: Grant [name] MRN: [id number] Date of Birth: [birth year]   Medicare Important Message Given:  Yes     Shelda Altes 11/05/2021, 8:16 AM

## 2021-11-05 NOTE — Discharge Summary (Signed)
Physician Discharge Summary  Patient ID: Grant [name] MRN: [id number] DOB/AGE: [birth year] 71 y.o.  Admit date: 10/26/2021 Discharge date: 11/05/2021  Admission Diagnoses:  Discharge Diagnoses:  Principal Problem:   Streptococcal bacteremia Active Problems:   Obstructive sleep apnea   PAF (paroxysmal atrial fibrillation) (HCC)   Chronic anticoagulation   S/P AVR (aortic valve replacement) and aortoplasty   Sepsis (HCC)   BPH (benign prostatic hyperplasia)   AKI (acute kidney injury) ([place])   COVID-19   Discharged Condition: stable  Hospital Course: 71 year old male with a history of atrial fibrillation on Xarelto, recent COVID infection, hypothyroidism, presented with worsening fatigue, decreased p.o. intake, fevers and shaking chills that began approximately 3 to 4 days prior to admission.  He was found to have streptococcal bacteremia.  Source of bacteremia is not entirely clear.  He has been started on IV antibiotics and admitted for further work-up.   Pt went to [facility] T[facility]. Dr. Debbe Mounts note mentions a thickened aortic root raising concern for an aortic root abscess. 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 discussed the patient with Dr. Cyndia Bent of [place]  at [facility]. The patient will be transferred to [facility] for CTS evaluation.   Sepsis secondary to streptococcal bacteremia -Patient was admitted with fever, tachycardia, acute kidney injury -Source of bacteremia is not entirely clear at this point -CT of chest, abdomen, pelvis did not show any obvious source of infection.  Urinalysis also unremarkable -He has not had any recent skin infections that he is aware of -Initially on ceftriaxone -He had 2 positive sets of blood cultures (both aerobic and anaerobic) positive for gram-positive cocci.  Further identification shows Streptococcus mitis/oralis -Repeat blood cultures drawn on 1/9, no growth as of yet -CT maxillofacial without signs of  infection/abscess -MRI brain without any evidence of septic emboli -2D echo without signs of vegetations -appreciate ID assistance -Antibiotics changed to penicillin G for 6 weeks and gent 2 weeks for synergy -TEE demonstrated a thickened aortic root that raises concerns for an abscess of the aortic root. Cardiac CT ordered. -Placed PICC line in preparation for discharge -Per CT surgery final recommendations did not feel abnormalities on imaging and TEE or aortic abscess, no plan for surgical intervention at this time   Aortic Root Abscess: Ruled out by CT surgery  Acute kidney injury -Secondary to sepsis -Renal function has improved with hydration   Paroxysmal atrial fibrillation -Anticoagulated with Xarelto -Since blood pressures are improving, he was restarted on metoprolol at reduced dose   Aortic aneurysm status post Bentall procedure -He is status post aortoplasty and AVR with bioprosthetic valve -Last saw CVTS approximately 1 month ago   Recent COVID infection -Was diagnosed 12/19 and completed a course of molnupiravir -Overall respiratory symptoms have improved -He is outside the window of isolation.  Would not place on airborne precautions   Hypothyroidism -Continue thyroid replacement   Anxiety -Continue As needed benzodiazepines   Obstructive sleep apnea -Continue on CPAP   BPH -Continue Flomax   History of asthmatic bronchitis -Continue inhaled steroids and bronchodilators   Thrombocytopenia -Related to sepsis -Improving -Continue to monitor   Patient been discharged in improved and stable condition.  We have arranged home health physical to complete 6-week course of IV antibiotics as mentioned above.  His wife has been educated on how to also do infusions.  Follow-up with primary care physician 1 to 2 weeks.  Follow-up with ID in 2 to 4 weeks.  Follow-up with  his CT surgeon in 3 months.  Discharge Exam: Blood pressure 121/89, pulse 87, temperature 97.9  F (36.6 C), temperature source Oral, resp. rate 16, height 5' 10" (1.778 m), weight 108 kg, SpO2 97 %. General appearance: alert and cooperative Neck: no adenopathy, no carotid bruit, no JVD, supple, symmetrical, trachea midline, and thyroid not enlarged, symmetric, no tenderness/mass/nodules Resp: clear to auscultation bilaterally Cardio: regular rate and rhythm, S1, S2 normal, no murmur, click, rub or gallop GI: soft, non-tender; bowel sounds normal; no masses,  no organomegaly Extremities: extremities normal, atraumatic, no cyanosis or edema Pulses: 2+ and symmetric Skin: Skin color, texture, turgor normal. No rashes or lesions  Disposition: Home with home health  Discharge Instructions     [facility] pharmacist to adjust dose for Vancomycin, Aminoglycosides and other anti-infective therapies as requested by physician.   Complete by: As directed    [facility] to provide Cath Flo 958m   Complete by: As directed    Administer for PICC line occlusion and as ordered by physician for other access device issues.   Anaphylaxis Kit: Provided to treat any anaphylactic reaction to the medication being provided to the patient if First Dose or when requested by physician   Complete by: As directed    Epinephrine 127mml vial / amp: Administer 0.58m36m0.58ml58mubcutaneously once for moderate to severe anaphylaxis, nurse to call physician and pharmacy when reaction occurs and call 911 if needed for immediate care   Diphenhydramine 50mg65mIV vial: Administer 25-50mg 9mM PRN for first dose reaction, rash, itching, mild reaction, nurse to call physician and pharmacy when reaction occurs   Sodium Chloride 0.9% NS 500ml I45mdminister if needed for hypovolemic blood pressure drop or as ordered by physician after call to physician with anaphylactic reaction   Change dressing on IV access line weekly and PRN   Complete by: As directed    Diet - low sodium heart healthy   Complete by:  As directed    Discharge instructions   Complete by: As directed    Follow-up with primary care physician 1 to 2 weeks  Follow-up with ID in 2 to 4 weeks  Follow-up with your CT surgeon in 3 months   Flush IV access with Sodium Chloride 0.9% and Heparin 10 units/ml or 100 units/ml   Complete by: As directed    Home infusion instructions - [facility]   Complete by: As directed    Instructions: Flush IV access with Sodium Chloride 0.9% and Heparin 10units/ml or 100units/ml   Change dressing on IV access line: Weekly and PRN   Instructions Cath Flo 2mg: Ad70mister for PICC Line occlusion and as ordered by physician for other access device   [facility] pharmacist to adjust dose for: Vancomycin, Aminoglycosides and other anti-infective therapies as requested by physician   Increase activity slowly   Complete by: As directed    Method of administration may be changed at the discretion of home infusion pharmacist based upon assessment of the patient and/or caregivers ability to self-administer the medication ordered   Complete by: As directed    No wound care   Complete by: As directed       Allergies as of 11/05/2021       Reactions   Perphenazine Swelling   Tongue swelling   Amiodarone Swelling   Codeine Itching   Tramadol Other (See Comments)   dellusion        Medication List  STOP taking these medications    doxycycline 100 MG capsule Commonly known as: VIBRAMYCIN       TAKE these medications    Aimovig 140 MG/ML Soaj Generic drug: Erenumab-aooe Inject 140 mg into the skin every 28 (twenty-eight) days.   albuterol 108 (90 Base) MCG/ACT inhaler Commonly known as: Ventolin HFA INHALE 2 PUFFS INTO THE LUNGS EVERY 6 HOURS AS NEEDED FOR WHEEZING OR SHORTNESS OF BREATH What changed:  how much to take when to take this reasons to take this additional instructions   ALPRAZolam 0.25 MG tablet Commonly known as: XANAX Take 1 tablet  (0.25 mg total) by mouth 3 (three) times daily as needed for anxiety.   aluminum hydroxide-magnesium carbonate 95-358 MG/15ML Susp Commonly known as: GAVISCON Take 15 mLs by mouth as needed for indigestion or heartburn.   azelastine 0.1 % nasal spray Commonly known as: ASTELIN Place 1-2 sprays into the nose 2 (two) times daily as needed for rhinitis or allergies.   benzonatate 200 MG capsule Commonly known as: TESSALON Take 1 capsule (200 mg total) by mouth 3 (three) times daily as needed for cough.   BLUE-EMU MAXIMUM STRENGTH EX Apply 1 application topically as needed (pain).   budesonide-formoterol 160-4.5 MCG/ACT inhaler Commonly known as: Symbicort Inhale 2 puffs into the lungs in the morning and at bedtime.   calcium carbonate 500 MG chewable tablet Commonly known as: TUMS - dosed in mg elemental calcium Chew 500-1,000 mg by mouth daily as needed for indigestion or heartburn.   Colcrys 0.6 MG tablet Generic drug: colchicine Take 0.6 mg by mouth in the morning.   diclofenac Sodium 1 % Gel Commonly known as: VOLTAREN Apply 2 g topically daily as needed (pain).   diphenhydrAMINE 25 MG tablet Commonly known as: BENADRYL Take 50 mg by mouth daily as needed for allergies.   EPINEPHrine 0.3 mg/0.3 mL Soaj injection Commonly known as: EPI-PEN Inject 0.3 mLs (0.3 mg total) into the muscle as needed (for angioedema).   Fiber Therapy 500 MG Tabs Generic drug: Methylcellulose (Laxative) Take 500-1,000 mg by mouth daily.   finasteride 5 MG tablet Commonly known as: PROSCAR Take 5 mg by mouth at bedtime.   gentamicin  IVPB Commonly known as: GARAMYCIN Inject 260 mg into the vein daily for 11 days. Indication:  bacteremia/PVIE First Dose: Yes Last Day of Therapy:  11/16/21 Labs - Sunday/Monday:  CBC/D, BMP, and gentamicin trough. Labs - Thursday:  BMP and gentamicin trough Labs - Every other week:  ESR and CRP Method of administration: Elastomeric Method of  administration may be changed at the discretion of home infusion pharmacist based upon assessment of the patient and/or caregiver's ability to self-administer the medication ordered.   gentamicin 260 mg in dextrose 5 % 50 mL Inject 260 mg into the vein daily for 12 days.   Iron 142 (45 Fe) MG Tbcr Take 45 mg by mouth in the morning and at bedtime.   levothyroxine 112 MCG tablet Commonly known as: SYNTHROID Take 112 mcg by mouth daily before breakfast.   liothyronine 25 MCG tablet Commonly known as: CYTOMEL Take 25 mcg by mouth daily before breakfast.   losartan 50 MG tablet Commonly known as: COZAAR TAKE 1 TABLET BY MOUTH EVERY DAY   metoprolol tartrate 50 MG tablet Commonly known as: LOPRESSOR Take 1 tablet (50 mg total) by mouth 2 (two) times daily.   Mucinex DM Maximum Strength 60-1200 MG Tb12 Take 1 tablet by mouth 2 (two) times daily as needed (congestion/cough).  multivitamin with minerals Tabs tablet Take 1 tablet by mouth daily.   NON FORMULARY Pt uses a cpap nightly   olopatadine 0.1 % ophthalmic solution Commonly known as: PATANOL Place 1 drop into both eyes 2 (two) times daily as needed for allergies.   omeprazole 40 MG capsule Commonly known as: PRILOSEC Take 1 capsule (40 mg total) by mouth 2 (two) times daily.   penicillin G  IVPB Inject 24 Million Units into the vein daily. Administer as continuous infusion  Indication:  bacteremia/PVIE First Dose: Yes Last Day of Therapy:  12/08/2021 Labs - Once weekly:  CBC/D and BMP, Labs - Every other week:  ESR and CRP Method of administration: Elastomeric (Continuous infusion) Method of administration may be changed at the discretion of home infusion pharmacist based upon assessment of the patient and/or caregiver's ability to self-administer the medication ordered.   penicillin G potassium 12 Million Units in dextrose 5 % 500 mL Inject 12 Million Units into the vein every 12 (twelve) hours.   rivaroxaban 20  MG Tabs tablet Commonly known as: Xarelto Take 1 tablet (20 mg total) by mouth daily with supper.   rizatriptan 10 MG tablet Commonly known as: MAXALT Take 1 tablet (10 mg total) by mouth as needed for migraine. May repeat in 2 hours if needed   saccharomyces boulardii 250 MG capsule Commonly known as: FLORASTOR Take 250 mg by mouth daily as needed (when taking antibiotics).   sodium chloride 0.65 % Soln nasal spray Commonly known as: OCEAN Place 1 spray into both nostrils as needed for congestion.   tamsulosin 0.4 MG Caps capsule Commonly known as: FLOMAX Take 0.4 mg by mouth in the morning and at bedtime.   Testosterone Cypionate 200 MG/ML Soln Inject 100 Units into the muscle once a week. Fridays   venlafaxine XR 150 MG 24 hr capsule Commonly known as: EFFEXOR-XR Take 150 mg by mouth daily with breakfast.               Discharge Care Instructions  (From admission, onward)           Start     Ordered   11/05/21 0000  Change dressing on IV access line weekly and PRN  (Home infusion instructions - [facility] )        11/05/21 [place] Follow up.   Specialty: Home Health Services Why: [facility] Registered Nurse- Office to call for visit times. Contact information: [address] [place] [address] [phone]                 Signed: Phillips Grout 11/05/2021, 10:39 AM

## 2021-11-05 NOTE — TOC Progression Note (Addendum)
Transition of Care [facility]) - Progression Note    Patient Details  Name: Grant [name] MRN: [id number] Date of Birth: 1951/06/25  Transition of Care [facility]) CM/SW Contact  Graves-Bigelow, Ocie Cornfield, RN Phone Number: 11/05/2021, 10:16 AM  Clinical Narrative:  [facility] [place] can accept the patient for home health RN services. Spouse states she will be in the home and [facility] was able to educate the spouse 11-04-21. Patient will need OPAT orders for home IV antibiotics. No further home needs identified at this time.    1139 11-05-21 Case Manager contacted MD and Pharmacist regarding Gentamicin trough levels at 1500. Levels can be obtained now from pharmacy. [facility] is aware so the drug can be mixed and the patient can discharge in a timely manner. Staff RN is aware. No further needs identified at this time.   Expected Discharge Plan: [place] Barriers to Discharge: Continued Medical Work up  Expected Discharge Plan and Services Expected Discharge Plan: [place] In-house Referral: NA Discharge Planning Services: CM Consult Post Acute Care Choice: Home Health, Durable Medical Equipment Living arrangements for the past 2 months: Single Family Home                 DME Arranged: IV pump/equipment [facility] Evert)   Date DME Agency Contacted: 11/04/21 Time DME Agency Contacted: 92 Representative spoke with at [place]: Pam-[facility] [place]: RN, Disease Management [place] Agency: [facility] Date [place] Agency Contacted: 11/04/21 Time [place]: 1400 Representative spoke with at [place]: Angie   Readmission Risk Interventions No flowsheet data found.

## 2021-11-05 NOTE — Plan of Care (Signed)

## 2021-11-06 DIAGNOSIS — U071 COVID-19: Secondary | ICD-10-CM | POA: Diagnosis not present

## 2021-11-06 DIAGNOSIS — N400 Enlarged prostate without lower urinary tract symptoms: Secondary | ICD-10-CM | POA: Diagnosis not present

## 2021-11-06 DIAGNOSIS — J45909 Unspecified asthma, uncomplicated: Secondary | ICD-10-CM | POA: Diagnosis not present

## 2021-11-06 DIAGNOSIS — K219 Gastro-esophageal reflux disease without esophagitis: Secondary | ICD-10-CM | POA: Diagnosis not present

## 2021-11-06 DIAGNOSIS — I10 Essential (primary) hypertension: Secondary | ICD-10-CM | POA: Diagnosis not present

## 2021-11-06 DIAGNOSIS — R7881 Bacteremia: Secondary | ICD-10-CM | POA: Diagnosis not present

## 2021-11-06 DIAGNOSIS — F32A Depression, unspecified: Secondary | ICD-10-CM | POA: Diagnosis not present

## 2021-11-06 DIAGNOSIS — G4733 Obstructive sleep apnea (adult) (pediatric): Secondary | ICD-10-CM | POA: Diagnosis not present

## 2021-11-06 DIAGNOSIS — M1990 Unspecified osteoarthritis, unspecified site: Secondary | ICD-10-CM | POA: Diagnosis not present

## 2021-11-06 DIAGNOSIS — A409 Streptococcal sepsis, unspecified: Secondary | ICD-10-CM | POA: Diagnosis not present

## 2021-11-06 DIAGNOSIS — M797 Fibromyalgia: Secondary | ICD-10-CM | POA: Diagnosis not present

## 2021-11-06 DIAGNOSIS — I959 Hypotension, unspecified: Secondary | ICD-10-CM | POA: Diagnosis not present

## 2021-11-06 DIAGNOSIS — G43909 Migraine, unspecified, not intractable, without status migrainosus: Secondary | ICD-10-CM | POA: Diagnosis not present

## 2021-11-06 DIAGNOSIS — F419 Anxiety disorder, unspecified: Secondary | ICD-10-CM | POA: Diagnosis not present

## 2021-11-06 DIAGNOSIS — N179 Acute kidney failure, unspecified: Secondary | ICD-10-CM | POA: Diagnosis not present

## 2021-11-06 DIAGNOSIS — E039 Hypothyroidism, unspecified: Secondary | ICD-10-CM | POA: Diagnosis not present

## 2021-11-06 DIAGNOSIS — I480 Paroxysmal atrial fibrillation: Secondary | ICD-10-CM | POA: Diagnosis not present

## 2021-11-06 LAB — GENTAMICIN LEVEL, RANDOM: Gentamicin Rm: <0.5 ug/mL

## 2021-11-07 ENCOUNTER — Other Ambulatory Visit: Payer: Self-pay

## 2021-11-07 ENCOUNTER — Emergency Department (HOSPITAL_COMMUNITY)
Admission: EM | Admit: 2021-11-07 | Discharge: 2021-11-07 | Disposition: A | Discharge status: Home / Self Care | Payer: Medicare Other | Attending: Emergency Medicine | Admitting: Emergency Medicine

## 2021-11-07 ENCOUNTER — Telehealth: Payer: Self-pay

## 2021-11-07 ENCOUNTER — Emergency Department (HOSPITAL_COMMUNITY): Payer: Medicare Other

## 2021-11-07 ENCOUNTER — Encounter (HOSPITAL_COMMUNITY): Payer: Self-pay | Admitting: Emergency Medicine

## 2021-11-07 ENCOUNTER — Emergency Department: Payer: Self-pay

## 2021-11-07 DIAGNOSIS — Z79899 Other long term (current) drug therapy: Secondary | ICD-10-CM | POA: Diagnosis not present

## 2021-11-07 DIAGNOSIS — T82838A Hemorrhage of vascular prosthetic devices, implants and grafts, initial encounter: Secondary | ICD-10-CM | POA: Diagnosis not present

## 2021-11-07 DIAGNOSIS — R7881 Bacteremia: Secondary | ICD-10-CM | POA: Diagnosis not present

## 2021-11-07 DIAGNOSIS — Z7901 Long term (current) use of anticoagulants: Secondary | ICD-10-CM | POA: Insufficient documentation

## 2021-11-07 DIAGNOSIS — T82898A Other specified complication of vascular prosthetic devices, implants and grafts, initial encounter: Secondary | ICD-10-CM | POA: Diagnosis not present

## 2021-11-07 DIAGNOSIS — A419 Sepsis, unspecified organism: Secondary | ICD-10-CM | POA: Diagnosis not present

## 2021-11-07 DIAGNOSIS — Z8546 Personal history of malignant neoplasm of prostate: Secondary | ICD-10-CM | POA: Insufficient documentation

## 2021-11-07 DIAGNOSIS — Y712 Prosthetic and other implants, materials and accessory cardiovascular devices associated with adverse incidents: Secondary | ICD-10-CM | POA: Insufficient documentation

## 2021-11-07 DIAGNOSIS — R531 Weakness: Secondary | ICD-10-CM | POA: Diagnosis not present

## 2021-11-07 DIAGNOSIS — E039 Hypothyroidism, unspecified: Secondary | ICD-10-CM | POA: Diagnosis not present

## 2021-11-07 DIAGNOSIS — I10 Essential (primary) hypertension: Secondary | ICD-10-CM | POA: Diagnosis not present

## 2021-11-07 MED ORDER — LORAZEPAM 0.5 MG PO TABS
0.5000 mg | ORAL_TABLET | Freq: Once | ORAL | Status: AC
  Administered 2021-11-07: 0.5 mg via ORAL
  Filled 2021-11-07: qty 1

## 2021-11-07 MED ORDER — DICYCLOMINE HCL 10 MG/5ML PO SOLN
10.0000 mg | Freq: Once | ORAL | Status: DC
  Filled 2021-11-07: qty 5

## 2021-11-07 MED ORDER — ALUM & MAG HYDROXIDE-SIMETH 200-200-20 MG/5ML PO SUSP
30.0000 mL | Freq: Once | ORAL | Status: AC
  Administered 2021-11-07: 30 mL via ORAL
  Filled 2021-11-07: qty 30

## 2021-11-07 MED ORDER — LIDOCAINE VISCOUS HCL 2 % MT SOLN
15.0000 mL | Freq: Once | OROMUCOSAL | Status: AC
  Administered 2021-11-07: 15 mL via ORAL
  Filled 2021-11-07: qty 15

## 2021-11-07 NOTE — Discharge Instructions (Signed)
Please call your Infectious Disease doctor in the morning for plan regarding PICC line and antibiotics.  Please return to the ED if you have any further concerns.

## 2021-11-07 NOTE — ED Provider Triage Note (Signed)
Emergency Medicine Provider Triage Evaluation Note  Grant [name] , a 71 y.o. male  was evaluated in triage.  Pt complains of bleeding from his PICC line.  He has a PICC line in his right upper arm.  He was getting treatment today, and noted that the area was bleeding.  He has had it in place for 3 days.  It is not sutured in.  No other complaints.  Review of Systems  Positive:  Negative:   Physical Exam  There were no vitals taken for this visit. Gen:   Awake, no distress   Resp:  Normal effort  MSK:   Moves extremities without difficulty Other:  PICC line in place.  Is not sutured in.  There is blood surrounding site, however no active drainage from the site.  Medical Decision Making  Medically screening exam initiated at 4:32 PM.  Appropriate orders placed.  [name] was informed that the remainder of the evaluation will be completed by another provider, this initial triage assessment does not replace that evaluation, and the importance of remaining in the ED until their evaluation is complete.  Patient notably anxious and triage.  Will give small dose of p.o. Ativan.  Calling IV team to fix PICC line and redressed it.  Also obtain chest x-ray.   Adolphus Birchwood, [place] 11/07/21 1633

## 2021-11-07 NOTE — ED Triage Notes (Signed)
Pt here from home with c/o a picc line that started bleeding while the home health nurse was drawing blood

## 2021-11-07 NOTE — ED Notes (Signed)
Pt refusing Vitals at this time

## 2021-11-07 NOTE — Telephone Encounter (Addendum)
Dana with [facility] [phone]) called regarding the patient's picc line. Hinton Dyer states she went to the patient's home today to do labs and dressing change, patient was bleeding at the picc line insertion site and the hub was out about 1.5 cm. Hinton Dyer stated before she could leave the insertion site was bleeding uncontrollably and she was unable to get the bleeding to stop. Patient is on the was to [facility] to have the picc line evaluated.  via EMS. Patient refused to let his wife drive him. Patient not scheduled with our office until 11/20/21

## 2021-11-07 NOTE — ED Provider Notes (Signed)
[facility] DEPT Provider Note   CSN: [id number] Arrival date & time: 11/07/21  1545     History  No chief complaint on file.   Grant [name] is a 71 y.o. male.  Past medical history of hypertension, hypothyroidism, fibromyalgia, prostate cancer.  Patient presents the emergency department with complaints of bleeding from his PICC line.  He has a PICC line in his right upper arm.  He was getting treatment today, and noted that the area was bleeding.  He has had it in place for 3 days.  It is not sutured in.  He also complains of an abdominal spasm which he has regularly and usually ist treated with tums.   HPI     Home Medications Prior to Admission medications   Medication Sig Start Date End Date Taking? Authorizing Provider  albuterol (VENTOLIN HFA) 108 (90 Base) MCG/ACT inhaler INHALE 2 PUFFS INTO THE LUNGS EVERY 6 HOURS AS NEEDED FOR WHEEZING OR SHORTNESS OF BREATH Patient taking differently: 2 puffs every 6 (six) hours as needed for shortness of breath. 09/30/21   Tanda Rockers, MD  ALPRAZolam Duanne Moron) 0.25 MG tablet Take 1 tablet (0.25 mg total) by mouth 3 (three) times daily as needed for anxiety. 10/13/20   Aline August, MD  aluminum hydroxide-magnesium carbonate (GAVISCON) 95-358 MG/15ML SUSP Take 15 mLs by mouth as needed for indigestion or heartburn.    [provider]  azelastine (ASTELIN) 137 MCG/SPRAY nasal spray Place 1-2 sprays into the nose 2 (two) times daily as needed for rhinitis or allergies.     [provider]  benzonatate (TESSALON) 200 MG capsule Take 1 capsule (200 mg total) by mouth 3 (three) times daily as needed for cough. 08/20/21   Parrett, Fonnie Mu, NP  budesonide-formoterol (SYMBICORT) 160-4.5 MCG/ACT inhaler Inhale 2 puffs into the lungs in the morning and at bedtime. 03/19/21   Parrett, Fonnie Mu, NP  calcium carbonate (TUMS - DOSED IN MG ELEMENTAL CALCIUM) 500 MG chewable tablet Chew 500-1,000 mg by mouth  daily as needed for indigestion or heartburn.    [provider]  COLCRYS 0.6 MG tablet Take 0.6 mg by mouth in the morning. 01/29/12   [provider]  Dextromethorphan-guaiFENesin (MUCINEX DM MAXIMUM STRENGTH) 60-1200 MG TB12 Take 1 tablet by mouth 2 (two) times daily as needed (congestion/cough).    [provider]  diclofenac Sodium (VOLTAREN) 1 % GEL Apply 2 g topically daily as needed (pain).    [provider]  diphenhydrAMINE (BENADRYL) 25 MG tablet Take 50 mg by mouth daily as needed for allergies.    [provider]  EPINEPHrine 0.3 mg/0.3 mL IJ SOAJ injection Inject 0.3 mLs (0.3 mg total) into the muscle as needed (for angioedema). 02/26/18   Josue Hector, MD  Erenumab-aooe (AIMOVIG) 140 MG/ML SOAJ Inject 140 mg into the skin every 28 (twenty-eight) days. 07/09/21   Cameron Sprang, MD  Ferrous Sulfate (IRON) 142 (45 Fe) MG TBCR Take 45 mg by mouth in the morning and at bedtime.    [provider]  finasteride (PROSCAR) 5 MG tablet Take 5 mg by mouth at bedtime.  01/14/14   [provider]  gentamicin (GARAMYCIN) IVPB Inject 260 mg into the vein daily for 11 days. Indication:  bacteremia/PVIE First Dose: Yes Last Day of Therapy:  11/16/21 Labs - Sunday/Monday:  CBC/D, BMP, and gentamicin trough. Labs - Thursday:  BMP and gentamicin trough Labs - Every other week:  ESR and  CRP Method of administration: Elastomeric Method of administration may be changed at the discretion of home infusion pharmacist based upon assessment of the patient and/or caregiver's ability to self-administer the medication ordered. 11/05/21 11/16/21  Phillips Grout, MD  gentamicin 260 mg in dextrose 5 % 50 mL Inject 260 mg into the vein daily for 12 days. 11/05/21 11/17/21  Phillips Grout, MD  levothyroxine (SYNTHROID, LEVOTHROID) 112 MCG tablet Take 112 mcg by mouth daily before breakfast.     [provider]  liothyronine (CYTOMEL) 25 MCG tablet  Take 25 mcg by mouth daily before breakfast.    [provider]  losartan (COZAAR) 50 MG tablet TAKE 1 TABLET BY MOUTH EVERY DAY Patient taking differently: Take 50 mg by mouth daily. 10/01/20   [name], MD  Menthol, Topical Analgesic, (BLUE-EMU MAXIMUM STRENGTH EX) Apply 1 application topically as needed (pain).    [provider]  Methylcellulose, Laxative, (FIBER THERAPY) 500 MG TABS Take 500-1,000 mg by mouth daily.    [provider]  metoprolol tartrate (LOPRESSOR) 50 MG tablet Take 1 tablet (50 mg total) by mouth 2 (two) times daily. 06/04/21   Josue Hector, MD  Multiple Vitamin (MULTIVITAMIN WITH MINERALS) TABS tablet Take 1 tablet by mouth daily.    [provider]  NON FORMULARY Pt uses a cpap nightly    [provider]  olopatadine (PATANOL) 0.1 % ophthalmic solution Place 1 drop into both eyes 2 (two) times daily as needed for allergies.    [provider]  omeprazole (PRILOSEC) 40 MG capsule Take 1 capsule (40 mg total) by mouth 2 (two) times daily. 10/31/16   Josue Hector, MD  penicillin G IVPB Inject 24 Million Units into the vein daily. Administer as continuous infusion  Indication:  bacteremia/PVIE First Dose: Yes Last Day of Therapy:  12/08/2021 Labs - Once weekly:  CBC/D and BMP, Labs - Every other week:  ESR and CRP Method of administration: Elastomeric (Continuous infusion) Method of administration may be changed at the discretion of home infusion pharmacist based upon assessment of the patient and/or caregiver's ability to self-administer the medication ordered. 11/05/21 12/08/21  Phillips Grout, MD  penicillin G potassium 12 Million Units in dextrose 5 % 500 mL Inject 12 Million Units into the vein every 12 (twelve) hours. 11/05/21   Phillips Grout, MD  rivaroxaban (XARELTO) 20 MG TABS tablet Take 1 tablet (20 mg total) by mouth daily with supper. 06/04/21   Josue Hector, MD  rizatriptan (MAXALT) 10 MG tablet  Take 1 tablet (10 mg total) by mouth as needed for migraine. May repeat in 2 hours if needed 07/09/21   Cameron Sprang, MD  saccharomyces boulardii (FLORASTOR) 250 MG capsule Take 250 mg by mouth daily as needed (when taking antibiotics).    [provider]  sodium chloride (OCEAN) 0.65 % SOLN nasal spray Place 1 spray into both nostrils as needed for congestion.    [provider]  tamsulosin (FLOMAX) 0.4 MG CAPS capsule Take 0.4 mg by mouth in the morning and at bedtime.    [provider]  Testosterone Cypionate 200 MG/ML SOLN Inject 100 Units into the muscle once a week. Fridays    [provider]  venlafaxine (EFFEXOR-XR) 150 MG 24 hr capsule Take 150 mg by mouth daily with breakfast.    [provider]      Allergies    Perphenazine, Amiodarone, Codeine, and Tramadol    Review  of Systems   Review of Systems  Physical Exam Updated Vital Signs There were no vitals taken for this visit. Physical Exam  ED Results / Procedures / Treatments   Labs (all labs ordered are listed, but only abnormal results are displayed) Labs Reviewed - No data to display  EKG None  Radiology [place] EKG SITE RITE  Result Date: 11/07/2021 If Site Rite image not attached, placement could not be confirmed due to current cardiac rhythm.   Procedures Procedures    Medications Ordered in ED Medications  dicyclomine (BENTYL) 10 MG/5ML solution 10 mg (has no administration in time range)  LORazepam (ATIVAN) tablet 0.5 mg (0.5 mg Oral Given 11/07/21 1652)  alum & mag hydroxide-simeth (MAALOX/MYLANTA) 200-200-20 MG/5ML suspension 30 mL (30 mLs Oral Given 11/07/21 1652)    And  lidocaine (XYLOCAINE) 2 % viscous mouth solution 15 mL (15 mLs Oral Given 11/07/21 1652)    ED Course/ Medical Decision Making/ A&P                           Medical Decision Making Amount and/or Complexity of Data Reviewed Radiology: ordered.  Risk OTC drugs. Prescription drug  management.   Patient was recently admitted for septic shock.  He was found to have streptococcal bacteremia with unknown source.  He was discharged home on IV antibiotics and a PICC on January 17.  He is on Eliquis for atrial fibrillation.  Likely this is contributing to some of his increased bleeding.  On evaluation, patient is notably anxious.  He ended up pulling out his PICC line while in triage. Will try to consult IV team to see if they can replace.  We will give some p.o. Ativan.  GI cocktail for gastric cramping.  Spoke to wife, and she is comfortable taking patient home with they are unable to get PICC line access today and he can have it scheduled outpatient for tomorrow.   Bleeding is controlled while in the ED with pressure dressing.  Patient and wife elect not to stay for PICC line placement here in the ED.  She plans to call his ID doctor tomorrow morning to come up with alternative plan because patient does not want PICC line replaced.  Patient is having no other complaints at this time.  I think that is that he is appropriate for discharge with follow-up tomorrow morning with his ID doctor.  Final Clinical Impression(s) / ED Diagnoses Final diagnoses:  Bleeding from PICC line, initial encounter [facility])    Rx / [place] Orders ED Discharge Orders     None         Adolphus Birchwood, PA-C 11/07/21 1728    Lacretia Leigh, MD 11/08/21 1720

## 2021-11-07 NOTE — ED Notes (Signed)
Pt arrived vis ems with a bleeding picc line , , upon arrival the picc line was partially out , no sutures or stat lock on the picc line, dsg reinforced

## 2021-11-07 NOTE — ED Notes (Signed)
Wife called  RN back in to room picc  line bleeding again , Picc line was almost all the way out , , Picc line pulled and pressure dsg applied

## 2021-11-08 ENCOUNTER — Encounter: Payer: Self-pay | Admitting: Infectious Diseases

## 2021-11-08 ENCOUNTER — Other Ambulatory Visit: Payer: Self-pay

## 2021-11-08 ENCOUNTER — Ambulatory Visit: Payer: Medicare Other | Admitting: Infectious Diseases

## 2021-11-08 VITALS — BP 120/85 | HR 87 | Temp 97.8°F | Ht 71.0 in | Wt 223.4 lb

## 2021-11-08 DIAGNOSIS — R7881 Bacteremia: Secondary | ICD-10-CM | POA: Diagnosis not present

## 2021-11-08 DIAGNOSIS — I330 Acute and subacute infective endocarditis: Secondary | ICD-10-CM | POA: Diagnosis not present

## 2021-11-08 DIAGNOSIS — Z5181 Encounter for therapeutic drug level monitoring: Secondary | ICD-10-CM

## 2021-11-08 DIAGNOSIS — B955 Unspecified streptococcus as the cause of diseases classified elsewhere: Secondary | ICD-10-CM

## 2021-11-08 MED ORDER — AMOXICILLIN 500 MG PO CAPS: 1000.0000 mg | ORAL_CAPSULE | Freq: Three times a day (TID) | ORAL | 0 refills | Status: DC

## 2021-11-08 NOTE — Telephone Encounter (Signed)
Per MD called IR to schedule picc line appt. Is scheduled at [facility] on 1/25 at 8 am. Patient and wife are okay with appt time/date.  Called [facility] and spoke with Burnard Bunting who will update pharmacy team. Leatrice Jewels, RMA

## 2021-11-08 NOTE — Progress Notes (Signed)
Patient Active Problem List   Diagnosis Date Noted   COVID-19    AKI (acute kidney injury) ([place]) 10/27/2021   Streptococcal bacteremia 10/27/2021   Hypomagnesemia 06/13/2021   Hypokalemia 06/13/2021   Colonic diverticular abscess 06/13/2021   Stricture of sigmoid colon ([place]) 06/12/2021   Anxiety 03/19/2021   Diverticulitis of colon 03/19/2021   Gout 03/19/2021   Hardening of the aorta (main artery of the heart) ([place]) 03/19/2021   Hypercoagulable state ([place]) 03/19/2021   Iron deficiency anemia 03/19/2021   Migraine 03/19/2021   Mixed hyperlipidemia 03/19/2021   Personal history of colonic polyps 03/19/2021   Recurrent major depression in remission ([place]) 03/19/2021   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   Acute diverticulitis 10/10/2020   Bilateral shoulder pain 10/26/2019   Morbid obesity due to excess calories ([place]) complicated by HBP, OSA/ gerd  07/07/2019   Upper airway cough syndrome 03/09/2019   Confusion with non-focal neuro exam 02/16/2019   Cough variant asthma vs UACS 11/15/2018   Cough 10/27/2018   Angioedema 03/07/2018   Sepsis ([place]) 03/07/2018   Asthma 03/07/2018   Hypothyroidism 03/07/2018   BPH (benign prostatic hyperplasia) 03/07/2018   Pain in joint of left shoulder 01/26/2018   Allergic reaction 01/21/2018   Atrial fibrillation with RVR ([place]) 07/28/2017   S/P AVR (aortic valve replacement) and aortoplasty 07/02/2017   Eczema 01/01/2014   Hoarseness or changing voice 07/24/2013   PAF (paroxysmal atrial fibrillation) (H[facility]) 05/18/2013   Chronic anticoagulation 05/18/2013   Preop cardiovascular exam 05/18/2013   Macular rash 08/10/2012   Acute asthmatic bronchitis 05/05/2012   Essential hypertension 06/17/2010   SYNCOPE 06/14/2010   DEPRESSION 02/11/2008   Obstructive sleep apnea 02/11/2008   Seasonal and perennial allergic rhinitis 02/11/2008   Chronic bronchitis ([place]) 02/11/2008   GERD (gastroesophageal reflux disease) 02/11/2008    Current Outpatient Medications on File Prior to Visit  Medication Sig Dispense Refill   albuterol (VENTOLIN HFA) 108 (90 Base) MCG/ACT inhaler INHALE 2 PUFFS INTO THE LUNGS EVERY 6 HOURS AS NEEDED FOR WHEEZING OR SHORTNESS OF BREATH (Patient taking differently: 2 puffs every 6 (six) hours as needed for shortness of breath.) 18 g 5   ALPRAZolam (XANAX) 0.25 MG tablet Take 1 tablet (0.25 mg total) by mouth 3 (three) times daily as needed for anxiety. 15 tablet 0   aluminum hydroxide-magnesium carbonate (GAVISCON) 95-358 MG/15ML SUSP Take 15 mLs by mouth as needed for indigestion or heartburn.     azelastine (ASTELIN) 137 MCG/SPRAY nasal spray Place 1-2 sprays into the nose 2 (two) times daily as needed for rhinitis or allergies.      benzonatate (TESSALON) 200 MG capsule Take 1 capsule (200 mg total) by mouth 3 (three) times daily as needed for cough. 60 capsule 3   budesonide-formoterol (SYMBICORT) 160-4.5 MCG/ACT inhaler Inhale 2 puffs into the lungs in the morning and at bedtime. 1 each 5   calcium carbonate (TUMS - DOSED IN MG ELEMENTAL CALCIUM) 500 MG chewable tablet Chew 500-1,000 mg by mouth daily as needed for indigestion or heartburn.     COLCRYS 0.6 MG tablet Take 0.6 mg by mouth in the morning.     Dextromethorphan-guaiFENesin (MUCINEX DM MAXIMUM STRENGTH) 60-1200 MG TB12 Take 1 tablet by mouth 2 (two) times daily as needed (congestion/cough).     diclofenac Sodium (VOLTAREN) 1 % GEL Apply 2 g topically daily as needed (pain).     diphenhydrAMINE (BENADRYL) 25 MG tablet Take 50 mg by mouth  daily as needed for allergies.     EPINEPHrine 0.3 mg/0.3 mL IJ SOAJ injection Inject 0.3 mLs (0.3 mg total) into the muscle as needed (for angioedema). 2 Device 0   Erenumab-aooe (AIMOVIG) 140 MG/ML SOAJ Inject 140 mg into the skin every 28 (twenty-eight) days. 1 mL 11   Ferrous Sulfate (IRON) 142 (45 Fe) MG TBCR Take 45 mg by mouth in the morning and at bedtime.     finasteride (PROSCAR) 5 MG tablet  Take 5 mg by mouth at bedtime.      levothyroxine (SYNTHROID, LEVOTHROID) 112 MCG tablet Take 112 mcg by mouth daily before breakfast.      liothyronine (CYTOMEL) 25 MCG tablet Take 25 mcg by mouth daily before breakfast.     losartan (COZAAR) 50 MG tablet TAKE 1 TABLET BY MOUTH EVERY DAY (Patient taking differently: Take 50 mg by mouth daily.) 30 tablet 6   Menthol, Topical Analgesic, (BLUE-EMU MAXIMUM STRENGTH EX) Apply 1 application topically as needed (pain).     Methylcellulose, Laxative, (FIBER THERAPY) 500 MG TABS Take 500-1,000 mg by mouth daily.     metoprolol tartrate (LOPRESSOR) 50 MG tablet Take 1 tablet (50 mg total) by mouth 2 (two) times daily. 180 tablet 3   Multiple Vitamin (MULTIVITAMIN WITH MINERALS) TABS tablet Take 1 tablet by mouth daily.     NON FORMULARY Pt uses a cpap nightly     olopatadine (PATANOL) 0.1 % ophthalmic solution Place 1 drop into both eyes 2 (two) times daily as needed for allergies.     omeprazole (PRILOSEC) 40 MG capsule Take 1 capsule (40 mg total) by mouth 2 (two) times daily. 180 capsule 1   rivaroxaban (XARELTO) 20 MG TABS tablet Take 1 tablet (20 mg total) by mouth daily with supper. 90 tablet 3   rizatriptan (MAXALT) 10 MG tablet Take 1 tablet (10 mg total) by mouth as needed for migraine. May repeat in 2 hours if needed 10 tablet 11   saccharomyces boulardii (FLORASTOR) 250 MG capsule Take 250 mg by mouth daily as needed (when taking antibiotics).     sodium chloride (OCEAN) 0.65 % SOLN nasal spray Place 1 spray into both nostrils as needed for congestion.     tamsulosin (FLOMAX) 0.4 MG CAPS capsule Take 0.4 mg by mouth in the morning and at bedtime.     Testosterone Cypionate 200 MG/ML SOLN Inject 100 Units into the muscle once a week. Fridays     venlafaxine (EFFEXOR-XR) 150 MG 24 hr capsule Take 150 mg by mouth daily with breakfast.     No current facility-administered medications on file prior to visit.   Subjective: 71 Y O male with a PMH of  A Fib on AC, COVID 19, Hypothyroidism, Aortic aneurysm s/p Bentall procedure ( AV and A root replacement in 2018), bladder tumor, HTN, PAF, GERD, Fibromyalgia, Depression  who was recently admitted in the hospital in January 2023 for Streptococcus mitis/oralis bacteremia thought to be odontogenic in origin. Seen by ID/cardiology and planned for 6 weeks of penicillin for 6 weeks and gentamicin for 2 weeks, discharged on 11/05/21  Interim - presented to ED 1/19 for bleeding through PICC ( was not sutured). Ended up pulling PICC line in ED. Patient returned home due to long ED wait time and came today for evaluation.   Today - Doing well. Denies any pain/swelling and tenderness at PICC site. Wife asking if patient can be switch to PO abtx in place of replacement of PICC line and  IV abtx.   Review of Systems: ROS Negative for fevers, chills Negative for nausea, vomiting and diarrhea All other systems reviewed and negative except as above   Past Medical History:  Diagnosis Date   Allergic rhinitis    Anxiety    Arthritis    Asthma    Bladder tumor    Chronic fatigue    COVID-19 05/22/2021   Depression    06/30/17 Pt denies being depressed, reports Effexor is taken for Chronic Fatigue    Dyspnea    Enlarged prostate    Fibromyalgia    GERD (gastroesophageal reflux disease)    Headache    History of chronic bronchitis    History of migraine    History of toxic encephalopathy    Hypothyroidism    OSA on CPAP    CPAP 14   PAF (paroxysmal atrial fibrillation) ([place]) CARDIOLOGIST -- DR [name]   DX OCT 2013   S/P AVR (aortic valve replacement) and aortoplasty    Sensitiveness to light    Unspecified essential hypertension    Urethral tumor    PROSTATIC   Past Surgical History:  Procedure Laterality Date   BENTALL PROCEDURE N/A 07/02/2017   Procedure: BENTALL PROCEDURE;  Surgeon: Ivin Poot, MD;  Location: [facility];  Service: Open Heart Surgery;  Laterality: N/A;  WITH CIRC ARREST    BIOPSY  02/06/2021   Procedure: BIOPSY;  Surgeon: Clarene Essex, MD;  Location: [facility];  Service: Endoscopy;;   BUBBLE STUDY  11/01/2021   Procedure: BUBBLE STUDY;  Surgeon: Geralynn Rile, MD;  Location: [facility];  Service: Cardiovascular;;   COLONOSCOPY     COLONOSCOPY WITH PROPOFOL N/A 02/06/2021   Procedure: COLONOSCOPY WITH PROPOFOL;  Surgeon: Clarene Essex, MD;  Location: [facility];  Service: Endoscopy;  Laterality: N/A;   CYSTOSCOPY W/ RETROGRADES Bilateral 06/15/2013   Procedure: CYSTOSCOPY WITH BILATERAL RETROGRADE PYELOGRAM  BLADDER BIOPSY, PROSTATIC URETHRAL BIOPSY, ;  Surgeon: Molli Hazard, MD;  [facility];  Service: Urology;  Laterality: Bilateral;   ESOPHAGOGASTRODUODENOSCOPY (EGD) WITH PROPOFOL N/A 03/11/2018   Procedure: ESOPHAGOGASTRODUODENOSCOPY (EGD) WITH PROPOFOL;  Surgeon: Clarene Essex, MD;  Location: [facility];  Service: Endoscopy;  Laterality: N/A;   LAPAROSCOPIC CHOLECYSTECTOMY  01/14/2001   PROCTOSCOPY N/A 06/12/2021   Procedure: RIGID PROCTOSCOPY;  Surgeon: [name], MD;  Location: [facility];  Service: General;  Laterality: N/A;   RIGHT/LEFT HEART CATH AND CORONARY ANGIOGRAPHY N/A 06/12/2017   Procedure: RIGHT/LEFT HEART CATH AND CORONARY ANGIOGRAPHY;  Surgeon: Larey Dresser, MD;  Location: [facility];  Service: Cardiovascular;  Laterality: N/A;   TEE WITHOUT CARDIOVERSION N/A 07/02/2017   Procedure: TRANSESOPHAGEAL ECHOCARDIOGRAM (TEE);  Surgeon: Prescott Gum, Collier Salina, MD;  Location: [facility];  Service: Open Heart Surgery;  Laterality: N/A;   TEE WITHOUT CARDIOVERSION N/A 11/01/2021   Procedure: TRANSESOPHAGEAL ECHOCARDIOGRAM (TEE);  Surgeon: Geralynn Rile, MD;  Location: [facility];  Service: Cardiovascular;  Laterality: N/A;   UMBILICAL HERNIA REPAIR  01/14/2008    Social History   Tobacco Use   Smoking status: Former    Packs/day: 0.50    Years: 27.00    Pack years: 13.50    Types: Cigarettes     Start date: 1970    Quit date: 10/21/1979    Years since quitting: 42.0   Smokeless tobacco: Never  Vaping Use   Vaping Use: Never used  Substance Use Topics   Alcohol use: Yes    Alcohol/week: 4.0 standard drinks  Types: 4 Cans of beer per week    Comment: drinks "as much as I could" - 2-3 beers up to 8-10   Drug use: Never    Family History  Problem Relation Age of Onset   Aortic aneurysm Mother 62       cause of death   Other Father        motor vehicle accident   Heart disease Other        family history    Allergies  Allergen Reactions   Perphenazine Swelling    Tongue swelling   Amiodarone Swelling   Codeine Itching   Tramadol Other (See Comments)    dellusion     Health Maintenance  Topic Date Due   Hepatitis C Screening  Never done   TETANUS/TDAP  Never done   Zoster Vaccines- Shingrix (1 of 2) Never done   COVID-19 Vaccine (6 - Booster for Pfizer series) 09/17/2021   Pneumonia Vaccine 25+ Years old (3 - PPSV23 if available, else PCV20) 07/12/2022   COLONOSCOPY (Pts 45-20yr Insurance coverage will need to be confirmed)  02/07/2031   INFLUENZA VACCINE  Completed   HPV VACCINES  Aged Out    Objective:  Vitals:   11/08/21 1123  BP: 120/85  Pulse: 87  Temp: 97.8 F (36.6 C)  TempSrc: Oral  SpO2: 99%  Weight: 223 lb 6.4 oz (101.3 kg)  Height: '5\' 11"'  (1.803 m)   Body mass index is 31.16 kg/m.  Physical Exam Constitutional:      Appearance: Normal appearance.  HENT:     Head: Normocephalic and atraumatic.      Mouth: Mucous membranes are moist.  Eyes:    Conjunctiva/sclera: Conjunctivae normal.     Pupils: Pupils are equal, round  Cardiovascular:     Rate and Rhythm: Normal rate     Heart sounds: distant heart sounds   Pulmonary:     Effort: Pulmonary effort is normal.     Breath sounds: Normal breath sounds.   Abdominal:     General:     Palpations: Abdomen is soft.   Musculoskeletal:        General: Normal range of motion.    Skin:    General: Skin is warm and dry.     Comments:  RT arm PCannon Ballline site - looks OK   Neurological:     General: No focal deficit present.     Mental Status: awake, alert and oriented to person, place, and time.   Psychiatric:        Mood and Affect: Mood normal.   Lab Results Lab Results  Component Value Date   WBC 11.4 (H) 11/05/2021   HGB 15.1 11/05/2021   HCT 43.9 11/05/2021   MCV 93.0 11/05/2021   PLT 276 11/05/2021    Lab Results  Component Value Date   CREATININE 0.96 11/05/2021   BUN 13 11/05/2021   NA 138 11/05/2021   K 4.3 11/05/2021   CL 104 11/05/2021   CO2 26 11/05/2021    Lab Results  Component Value Date   ALT 21 10/27/2021   AST 28 10/27/2021   ALKPHOS 35 (L) 10/27/2021   BILITOT 0.5 10/27/2021    Lab Results  Component Value Date   CHOL 185 02/17/2019   HDL 42 02/17/2019   LDLCALC 96 02/17/2019   TRIG 234 (H) 02/17/2019   CHOLHDL 4.4 02/17/2019   No results found for: LABRPR, RPRTITER No results found for: HIV1RNAQUANT, HIV1RNAVL, CD4TABS  Problem  List Items Addressed This Visit       Cardiovascular and Mediastinum   Acute infective endocarditis - Primary   Relevant Orders   IR PICC REPLACEMENT LEFT INC IMG GUIDE     Other   Streptococcal bacteremia   Medication monitoring encounter   Assessment/Plan 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 New end date updated in OPAT orders Fu in 2-3 weeks   OPAT orders Diagnosis: Prosthetic valve endocarditis   Culture Result: Strep  mitis/oralis  Allergies  Allergen Reactions   Perphenazine Swelling    Tongue swelling   Amiodarone Swelling   Codeine Itching   Tramadol Other (See Comments)    dellusion     OPAT Orders Discharge antibiotics to be given via PICC line Discharge antibiotics: Penicillin 24 million units daily, gentamicin, pharmacy to dose  Per pharmacy protocol  Aim for Vancomycin trough 15-20 or AUC 400-550 (unless otherwise indicated) Duration: 6 weeks for Penicillin and 2 weeks for Gentamicin  End Date: 12/16/21 for penicillin 11/17/21 for gentamicin  PIC Care Per Protocol:  Home health RN for IV administration and teaching; PICC line care and labs.    Labs weekly while on IV antibiotics: _X_ CBC with differential _X_ BMP __ CMP __ CRP __ ESR _X_ Gentamicin trough __ CK  X_ Please pull PIC at completion of IV antibiotics __ Please leave PIC in place until doctor has seen patient or been notified  Fax weekly labs to ([phone] Clinic Follow Up Appt: 11/26/21 At 10:30 am   I have personally spent 48 minutes involved in face-to-face and non-face-to-face activities for this patient on the day of the visit. Professional time spent includes the following activities: Preparing to see the patient (review of tests), Obtaining and/or reviewing separately obtained history (admission/discharge record), Performing a medically appropriate examination and/or evaluation , Ordering medications/tests/procedures, referring and communicating with other health care professionals, Documenting clinical information in the EMR, Independently interpreting results (not separately reported), Communicating results to the patient/family/caregiver, Counseling and educating the patient/family/caregiver and Care coordination (not separately reported).   SWilber Oliphant MReeltownfor Infectious Disease [facility]ace[facility] 11/09/2021, 8:33 AM

## 2021-11-08 NOTE — Telephone Encounter (Signed)
Thank you :)

## 2021-11-08 NOTE — Telephone Encounter (Signed)
Patient's wife called this morning to discuss treatment plan for the patient. Patient was seen at the ED yesterday and picc line was removed.Patient's last dose of IV antibiotics was yesterday.  As of right now patient is not on any antibiotics and patient's wife is wanting to know if a new picc line needs to be place or if he can do oral antibiotics. Patient is scheduled today with Dr. West Bali to discuss.

## 2021-11-09 ENCOUNTER — Encounter: Payer: Self-pay | Admitting: Neurology

## 2021-11-09 DIAGNOSIS — Z5181 Encounter for therapeutic drug level monitoring: Secondary | ICD-10-CM | POA: Insufficient documentation

## 2021-11-09 DIAGNOSIS — R7881 Bacteremia: Secondary | ICD-10-CM | POA: Diagnosis not present

## 2021-11-09 DIAGNOSIS — I330 Acute and subacute infective endocarditis: Secondary | ICD-10-CM | POA: Insufficient documentation

## 2021-11-10 DIAGNOSIS — R7881 Bacteremia: Secondary | ICD-10-CM | POA: Diagnosis not present

## 2021-11-11 ENCOUNTER — Telehealth: Payer: Self-pay

## 2021-11-11 DIAGNOSIS — R7881 Bacteremia: Secondary | ICD-10-CM | POA: Diagnosis not present

## 2021-11-11 NOTE — Telephone Encounter (Signed)
F/u  [name] (Key: [id number]) Aimovig 140MG /ML auto-injectors   Form [employer] [place] [employer] Part D Electronic Request Form (CB) Created 3 hours ago Sent to Plan 3 hours ago Plan Response 3 hours ago Submit Clinical Questions 3 hours ago Determination Wait for Determination Please wait for [facility] to return a determination.

## 2021-11-11 NOTE — Telephone Encounter (Signed)
New message   Grant [name] (Key: [id number]) Aimovig 140MG /ML auto-injectors   Form [employer] [place] Medicare Part D Electronic Request Form (CB) Created 3 hours ago Sent to Plan 3 hours ago Plan Response 3 hours ago Submit Clinical Questions 3 hours ago Determination Wait for Determination Please wait for [facility] to return a determination.

## 2021-11-12 DIAGNOSIS — R3912 Poor urinary stream: Secondary | ICD-10-CM | POA: Diagnosis not present

## 2021-11-12 DIAGNOSIS — R350 Frequency of micturition: Secondary | ICD-10-CM | POA: Diagnosis not present

## 2021-11-12 DIAGNOSIS — R7881 Bacteremia: Secondary | ICD-10-CM | POA: Diagnosis not present

## 2021-11-12 DIAGNOSIS — N401 Enlarged prostate with lower urinary tract symptoms: Secondary | ICD-10-CM | POA: Diagnosis not present

## 2021-11-13 ENCOUNTER — Telehealth: Payer: Self-pay

## 2021-11-13 ENCOUNTER — Other Ambulatory Visit: Payer: Self-pay

## 2021-11-13 ENCOUNTER — Encounter: Payer: Self-pay | Admitting: Internal Medicine

## 2021-11-13 ENCOUNTER — Ambulatory Visit (HOSPITAL_COMMUNITY)
Admission: RE | Admit: 2021-11-13 | Discharge: 2021-11-13 | Disposition: A | Discharge status: Home / Self Care | Payer: Medicare Other | Source: Ambulatory Visit | Attending: Infectious Diseases | Admitting: Infectious Diseases

## 2021-11-13 DIAGNOSIS — I330 Acute and subacute infective endocarditis: Secondary | ICD-10-CM | POA: Insufficient documentation

## 2021-11-13 DIAGNOSIS — M549 Dorsalgia, unspecified: Secondary | ICD-10-CM | POA: Diagnosis not present

## 2021-11-13 DIAGNOSIS — R7881 Bacteremia: Secondary | ICD-10-CM | POA: Diagnosis not present

## 2021-11-13 DIAGNOSIS — Z452 Encounter for adjustment and management of vascular access device: Secondary | ICD-10-CM | POA: Diagnosis not present

## 2021-11-13 MED ORDER — LIDOCAINE HCL 1 % IJ SOLN
INTRAMUSCULAR | Status: AC
  Filled 2021-11-13: qty 20

## 2021-11-13 MED ORDER — HEPARIN SOD (PORK) LOCK FLUSH 100 UNIT/ML IV SOLN
INTRAVENOUS | Status: AC
  Filled 2021-11-13: qty 5

## 2021-11-13 MED ORDER — LIDOCAINE HCL 1 % IJ SOLN
INTRAMUSCULAR | Status: DC | PRN
  Administered 2021-11-13: 5 mL

## 2021-11-13 NOTE — Procedures (Signed)
PROCEDURE SUMMARY:  Successful placement of a single lumen PICC line to left brachial vein. Length 50  cm Tip at lower SVC/RA No complications PICC capped Ready for use. EBL = trace  Please see full dictation in Imaging section for details.   Arina Torry S Rose-Marie Hickling PA-C 11/13/2021 9:17 AM

## 2021-11-13 NOTE — Telephone Encounter (Signed)
Grant [name] with [facility] called to confirm patient IV antibiotics end date and pull picc date. Per Dr. Candiss Norse opat note patient is to end IV amoxicillin on 12/16/21, IV gentamicin 11/17/21 and picc can be removed after last dose. I also advised Grant [name] that opat notes are in the patient's chart as well and updated.  Trai Ells T Brooks Sailors

## 2021-11-14 ENCOUNTER — Inpatient Hospital Stay: Payer: Medicare Other | Admitting: Internal Medicine

## 2021-11-14 DIAGNOSIS — A409 Streptococcal sepsis, unspecified: Secondary | ICD-10-CM | POA: Diagnosis not present

## 2021-11-14 NOTE — Telephone Encounter (Signed)
Thank you :)

## 2021-11-15 ENCOUNTER — Telehealth: Payer: Self-pay | Admitting: Cardiovascular Disease

## 2021-11-15 ENCOUNTER — Telehealth: Payer: Self-pay

## 2021-11-15 DIAGNOSIS — R7881 Bacteremia: Secondary | ICD-10-CM | POA: Diagnosis not present

## 2021-11-15 NOTE — Telephone Encounter (Signed)
The patient's wife, Juliann Pulse, left a voicemail with triage regarding Lacey's "heart symptoms" that she feared were related to the IV antibiotics. He is being treated with IV gentamicin and penicillin for prosthetic valve endocarditis, therapy began about two weeks ago.  I returned her call and she explained that the patient has been experiencing "fluttering" in his heart that feels similar to his afib, which has not been symptomatic since 2018. She said he has had mild chest pressure and shortness of breath , which has resolved.   She wanted to know if we could discontinue oral antibiotics and switch to orals. I explained that due to the extent of his infection, IV antibiotics are the best option and have the highest chance of curing his infection. I also assured her that his symptoms did not seem likely to be antibiotic related, and they are more likely symptoms of the infection itself. She also had contacted radiology today, and they assured her that the PICC line is not in a place where it could affect the heart rate.   I let her know that we do not think the symptoms would improve by stopping IV antibiotics. We also do not think his symptoms warrant an ED visit at this time. If symptoms become more concerning, she should take him to the ED or an urgent care.  Donald Pore, PharmD Pharmacy Resident 11/15/2021, 12:21 PM

## 2021-11-15 NOTE — Telephone Encounter (Signed)
Pt c/o of Chest Pain: STAT if CP now or developed within 24 hours  1. Are you having CP right now? yes  2. Are you experiencing any other symptoms (ex. SOB, nausea, vomiting, sweating)? sob  3. How long have you been experiencing CP? 2 days  4. Is your CP continuous or coming and going? Coming and going   5. Have you taken Nitroglycerin? no ?  The cp start after his procedure on PICC 1/25

## 2021-11-15 NOTE — Telephone Encounter (Signed)
Thanks

## 2021-11-15 NOTE — Telephone Encounter (Signed)
Esmond Camper spoke with patient's wife; see new telephone note. Thanks!

## 2021-11-15 NOTE — Telephone Encounter (Signed)
Patient's wife left voicemail in triage stating her husband may be having some heart issues related to IV therapy and she prefers to switching him Triage nurse attempted to call patient back shortly after voicemail received, no answer. Left a voicemail to return office phone call ASAP in regards to her concerns and if patient is having any shortness of breath to please seek emergency call and call 911.   Patient's information also given to pharmacy team for review and reach out to patient for more information regarding her voicemail. Routing call to pharmacy and MD.  Eugenia Mcalpine

## 2021-11-15 NOTE — Telephone Encounter (Signed)
Spoke to wife. She states she called [facility] who told her to call us. They have had some difficulty with PICC line, as this is 2nd one that was placed 2 days ago. Since then pt has experienced heart flutters, a little chest pressure, and sob. Discussed that recent sepsis/bacterial infection could be exacerbating afib.  Current VS: 96.4 temp 95 % O2 78 HR 126/80 BP  She is going to follow up with infectious disease to ensure PICC line is not causing issues. She will call office back if we need to further address the irregular heart rates/chest pressure/sob. She thanks me for speaking with her about this.

## 2021-11-16 DIAGNOSIS — R7881 Bacteremia: Secondary | ICD-10-CM | POA: Diagnosis not present

## 2021-11-17 DIAGNOSIS — R7881 Bacteremia: Secondary | ICD-10-CM | POA: Diagnosis not present

## 2021-11-18 DIAGNOSIS — A409 Streptococcal sepsis, unspecified: Secondary | ICD-10-CM | POA: Diagnosis not present

## 2021-11-18 DIAGNOSIS — R7881 Bacteremia: Secondary | ICD-10-CM | POA: Diagnosis not present

## 2021-11-18 NOTE — Telephone Encounter (Signed)
Patient's potassium was elevated at 6.7 on 1/19 (resulted 1/24), but looks like sample hemolyzed. Repeat on 1/26 was normal at 4.4 - confirmed with Jeani Hawking at [facility]. Thanks, Estill Bamberg

## 2021-11-19 ENCOUNTER — Other Ambulatory Visit (HOSPITAL_COMMUNITY): Payer: Self-pay

## 2021-11-19 ENCOUNTER — Encounter: Payer: Self-pay | Admitting: Cardiovascular Disease

## 2021-11-19 DIAGNOSIS — R7881 Bacteremia: Secondary | ICD-10-CM | POA: Diagnosis not present

## 2021-11-19 DIAGNOSIS — I10 Essential (primary) hypertension: Secondary | ICD-10-CM | POA: Diagnosis not present

## 2021-11-19 DIAGNOSIS — I480 Paroxysmal atrial fibrillation: Secondary | ICD-10-CM | POA: Diagnosis not present

## 2021-11-19 DIAGNOSIS — B955 Unspecified streptococcus as the cause of diseases classified elsewhere: Secondary | ICD-10-CM | POA: Diagnosis not present

## 2021-11-19 NOTE — Telephone Encounter (Signed)
I spoke with patient's wife and she reports patient that patient still having some "heart flutters" since Sat. Patient states he was not having the symptoms until after the picc was placed. Please advise. I have also advised the patient's wife to reach out to cardiology to discuss the "heart flutters". [facility] Ha[facility]stick, CMA

## 2021-11-19 NOTE — Telephone Encounter (Signed)
Patient has a history of PAF, HTN, OSA on CPAP with AVR PVT  for bicuspid valve 2018 Bentall. Patient's PCP and infection disease has recommended patient see cardiology for his fluttering, that his PICC line is not the issue. Will have patient come in and see DOD, Dr. Burt Knack for evaluation. Patient's wife verbalized understanding and agreed to plan.

## 2021-11-19 NOTE — Telephone Encounter (Signed)
Patient scheduled to see cardiology 11/20/21. Grant [name] Grant [name]

## 2021-11-19 NOTE — Telephone Encounter (Signed)
Calling back to say that she sent a mychart on update from patient pcp

## 2021-11-19 NOTE — Telephone Encounter (Signed)
I would leave that up to Dr. Candiss Norse. I am not worried about his symptoms and the PICC line - see [name]'s telephone note from Friday.

## 2021-11-20 ENCOUNTER — Ambulatory Visit: Payer: Medicare Other | Admitting: Cardiovascular Disease

## 2021-11-20 ENCOUNTER — Encounter: Payer: Self-pay | Admitting: Cardiovascular Disease

## 2021-11-20 ENCOUNTER — Other Ambulatory Visit: Payer: Self-pay

## 2021-11-20 ENCOUNTER — Ambulatory Visit
Admission: RE | Admit: 2021-11-20 | Discharge: 2021-11-20 | Disposition: A | Discharge status: Home / Self Care | Payer: Medicare Other | Source: Ambulatory Visit | Attending: Cardiovascular Disease | Admitting: Cardiovascular Disease

## 2021-11-20 VITALS — BP 120/80 | HR 80 | Ht 71.0 in | Wt 235.2 lb

## 2021-11-20 DIAGNOSIS — I480 Paroxysmal atrial fibrillation: Secondary | ICD-10-CM

## 2021-11-20 DIAGNOSIS — I483 Typical atrial flutter: Secondary | ICD-10-CM

## 2021-11-20 DIAGNOSIS — R0602 Shortness of breath: Secondary | ICD-10-CM | POA: Diagnosis not present

## 2021-11-20 DIAGNOSIS — I5032 Chronic diastolic (congestive) heart failure: Secondary | ICD-10-CM

## 2021-11-20 DIAGNOSIS — Z452 Encounter for adjustment and management of vascular access device: Secondary | ICD-10-CM | POA: Diagnosis not present

## 2021-11-20 DIAGNOSIS — R7881 Bacteremia: Secondary | ICD-10-CM | POA: Diagnosis not present

## 2021-11-20 NOTE — H&P (View-Only) (Signed)
Cardiology Office Note:    Date:  11/21/2021   ID:  DJANGO NGUYEN, DOB 03/14/1951, MRN [id number]  PCP:  Shirline Frees, MD   [facility] Providers Cardiologist:  [name], MD     Referring MD: Shirline Frees, MD   Chief Complaint  Patient presents with   Shortness of Breath    History of Present Illness:    Grant [name] is a 71 y.o. male with a complicated cardiac history who presents today as an add-on for evaluation of heart palpitations.  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  He was admitted in January with a febrile illness and grew out strep mitis/oralis on blood cultures.  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  A gated cardiac CTA was also suspicious but felt that circumferential thickening could be related to pledget material from the time of surgery.  There is no pseudoaneurysm or obvious fistula seen.  The patient was followed by infectious disease and is currently being treated with IV antibiotics.  The patient has been feeling a "fluttering" in his chest now for about 4 days.  This started around the time that his PICC line was changed out.  He also complains of fatigue and shortness of breath, both of which are chronic.  He has had no increased leg edema, orthopnea, or PND.  No chest pain or pressure.  Past Medical History:  Diagnosis Date   Allergic rhinitis    Anxiety    Arthritis    Asthma    Bladder tumor    Chronic fatigue    COVID-19 05/22/2021   Depression    06/30/17 Pt denies being depressed, reports Effexor is taken for Chronic Fatigue    Dyspnea     Enlarged prostate    Fibromyalgia    GERD (gastroesophageal reflux disease)    Headache    History of chronic bronchitis    History of migraine    History of toxic encephalopathy    Hypothyroidism    OSA on CPAP    CPAP 14   PAF (paroxysmal atrial fibrillation) ([place]) CARDIOLOGIST -- DR [name]   DX OCT 2013   S/P AVR (aortic valve replacement) and aortoplasty    Sensitiveness to light    Unspecified essential hypertension    Urethral tumor    PROSTATIC    Past Surgical History:  Procedure Laterality Date   BENTALL PROCEDURE N/A 07/02/2017   Procedure: BENTALL PROCEDURE;  Surgeon: Ivin Poot, MD;  Location: [place];  Service: Open Heart Surgery;  Laterality: N/A;  WITH CIRC ARREST   BIOPSY  02/06/2021   Procedure: BIOPSY;  Surgeon: Clarene Essex, MD;  Location: [facility];  Service: Endoscopy;;   BUBBLE STUDY  11/01/2021   Procedure: BUBBLE STUDY;  Surgeon: Geralynn Rile, MD;  Location: [place];  Service: Cardiovascular;;   COLONOSCOPY     COLONOSCOPY WITH PROPOFOL N/A 02/06/2021   Procedure: COLONOSCOPY WITH PROPOFOL;  Surgeon: Clarene Essex, MD;  [facility]: [facility];  Service: Endoscopy;  Laterality: N/A;   CYSTOSCOPY W/ RETROGRADES Bilateral 06/15/2013   Procedure: CYSTOSCOPY WITH BILATERAL RETROGRADE PYELOGRAM  BLADDER BIOPSY, PROSTATIC URETHRAL BIOPSY, ;  Surgeon: Molli Hazard, MD;  Location: [place];  Service: Urology;  Laterality: Bilateral;   ESOPHAGOGASTRODUODENOSCOPY (EGD) WITH PROPOFOL N/A 03/11/2018   Procedure: ESOPHAGOGASTRODUODENOSCOPY (EGD) WITH PROPOFOL;  Surgeon: Clarene Essex, MD;  Location: [place];  Service: Endoscopy;  Laterality: N/A;   LAPAROSCOPIC CHOLECYSTECTOMY  01/14/2001   PROCTOSCOPY N/A 06/12/2021   Procedure: RIGID PROCTOSCOPY;  Surgeon:  Boston, MD;  Location: [facility];  Service: General;  Laterality: N/A;   RIGHT/LEFT HEART CATH AND CORONARY ANGIOGRAPHY N/A 06/12/2017   Procedure: RIGHT/LEFT HEART  CATH AND CORONARY ANGIOGRAPHY;  Surgeon: Larey Dresser, MD;  Location: [place] [facility];  Service: Cardiovascular;  Laterality: N/A;   TEE WITHOUT CARDIOVERSION N/A 07/02/2017   Procedure: TRANSESOPHAGEAL ECHOCARDIOGRAM (TEE);  Surgeon: Prescott Gum, Collier Salina, MD;  Location: [place];  Service: Open Heart Surgery;  Laterality: N/A;   TEE WITHOUT CARDIOVERSION N/A 11/01/2021   Procedure: TRANSESOPHAGEAL ECHOCARDIOGRAM (TEE);  Surgeon: Geralynn Rile, MD;  Location: [place];  Service: Cardiovascular;  Laterality: N/A;   UMBILICAL HERNIA REPAIR  01/14/2008    Current Medications: Current Meds  Medication Sig   albuterol (VENTOLIN HFA) 108 (90 Base) MCG/ACT inhaler INHALE 2 PUFFS INTO THE LUNGS EVERY 6 HOURS AS NEEDED FOR WHEEZING OR SHORTNESS OF BREATH (Patient taking differently: 2 puffs every 6 (six) hours as needed for shortness of breath.)   ALPRAZolam (XANAX) 0.25 MG tablet Take 1 tablet (0.25 mg total) by mouth 3 (three) times daily as needed for anxiety.   aluminum hydroxide-magnesium carbonate (GAVISCON) 95-358 MG/15ML SUSP Take 15 mLs by mouth as needed for indigestion or heartburn.   azelastine (ASTELIN) 137 MCG/SPRAY nasal spray Place 1-2 sprays into the nose 2 (two) times daily as needed for rhinitis or allergies.    benzonatate (TESSALON) 200 MG capsule Take 1 capsule (200 mg total) by mouth 3 (three) times daily as needed for cough.   budesonide-formoterol (SYMBICORT) 160-4.5 MCG/ACT inhaler Inhale 2 puffs into the lungs in the morning and at bedtime.   calcium carbonate (TUMS - DOSED IN MG ELEMENTAL CALCIUM) 500 MG chewable tablet Chew 500-1,000 mg by mouth daily as needed for indigestion or heartburn.   COLCRYS 0.6 MG tablet Take 0.6 mg by mouth in the morning.   Dextromethorphan-guaiFENesin (MUCINEX DM MAXIMUM STRENGTH) 60-1200 MG TB12 Take 1 tablet by mouth 2 (two) times daily as needed (congestion/cough).   diclofenac Sodium (VOLTAREN) 1 % GEL Apply 2 g topically daily as  needed (pain).   diphenhydrAMINE (BENADRYL) 25 MG tablet Take 50 mg by mouth daily as needed for allergies.   EPINEPHrine 0.3 mg/0.3 mL IJ SOAJ injection Inject 0.3 mLs (0.3 mg total) into the muscle as needed (for angioedema).   Erenumab-aooe (AIMOVIG) 140 MG/ML SOAJ Inject 140 mg into the skin every 28 (twenty-eight) days.   Ferrous Sulfate (IRON) 142 (45 Fe) MG TBCR Take 45 mg by mouth in the morning and at bedtime.   finasteride (PROSCAR) 5 MG tablet Take 5 mg by mouth at bedtime.    levothyroxine (SYNTHROID, LEVOTHROID) 112 MCG tablet Take 112 mcg by mouth daily before breakfast.    liothyronine (CYTOMEL) 25 MCG tablet Take 25 mcg by mouth daily before breakfast.   LORazepam (ATIVAN) 1 MG tablet Take 0.5-1 mg by mouth 2 (two) times daily as needed.   losartan (COZAAR) 50 MG tablet TAKE 1 TABLET BY MOUTH EVERY DAY (Patient taking differently: Take 50 mg by mouth daily.)   Menthol, Topical Analgesic, (BLUE-EMU MAXIMUM STRENGTH EX) Apply 1 application topically as needed (pain).  Methylcellulose, Laxative, (FIBER THERAPY) 500 MG TABS Take 500-1,000 mg by mouth daily.   metoprolol tartrate (LOPRESSOR) 50 MG tablet Take 1 tablet (50 mg total) by mouth 2 (two) times daily.   Multiple Vitamin (MULTIVITAMIN WITH MINERALS) TABS tablet Take 1 tablet by mouth daily.   NON FORMULARY Pt uses a cpap nightly   olopatadine (PATANOL) 0.1 % ophthalmic solution Place 1 drop into both eyes 2 (two) times daily as needed for allergies.   omeprazole (PRILOSEC) 40 MG capsule Take 1 capsule (40 mg total) by mouth 2 (two) times daily.   penicillin G potassium 60000 UNIT/ML 1000 mL   rivaroxaban (XARELTO) 20 MG TABS tablet Take 1 tablet (20 mg total) by mouth daily with supper.   rizatriptan (MAXALT) 10 MG tablet Take 1 tablet (10 mg total) by mouth as needed for migraine. May repeat in 2 hours if needed   saccharomyces boulardii (FLORASTOR) 250 MG capsule Take 250 mg by mouth daily as needed (when taking  antibiotics).   sodium chloride (OCEAN) 0.65 % SOLN nasal spray Place 1 spray into both nostrils as needed for congestion.   tamsulosin (FLOMAX) 0.4 MG CAPS capsule Take 0.4 mg by mouth in the morning and at bedtime.   Testosterone Cypionate 200 MG/ML SOLN Inject 100 Units into the muscle once a week. Fridays   venlafaxine (EFFEXOR-XR) 150 MG 24 hr capsule Take 150 mg by mouth daily with breakfast.     Allergies:   Perphenazine, Amiodarone, Codeine, and Tramadol   Social History   Socioeconomic History   Marital status: Married    Spouse name: Not on file   Number of children: Not on file   Years of education: Not on file   Highest education level: Not on file  Occupational History   Occupation: disabled    Comment: owner of [employer]  Tobacco Use   Smoking status: Former    Packs/day: 0.50    Years: 27.00    Pack years: 13.50    Types: Cigarettes    Start date: 1970    Quit date: 10/21/1979    Years since quitting: 42.1   Smokeless tobacco: Never  Vaping Use   Vaping Use: Never used  Substance and Sexual Activity   Alcohol use: Yes    Alcohol/week: 4.0 standard drinks    Types: 4 Cans of beer per week    Comment: drinks "as much as I could" - 2-3 beers up to 8-10   Drug use: Never   Sexual activity: Not on file  Other Topics Concern   Not on file  Social History Narrative   Right handed   One story home   Drinks no caffeine   Social Determinants of Health   Financial Resource Strain: Not on file  Food Insecurity: Not on file  Transportation Needs: Not on file  Physical Activity: Not on file  Stress: Not on file  Social Connections: Not on file     Family History: The patient's family history includes Aortic aneurysm (age of onset: 9) in his mother; Heart disease in an other family member; Other in his father.  ROS:   Please see the history of present illness.    All other systems reviewed and are negative.  EKGs/Labs/Other Studies Reviewed:    The  following studies were reviewed today: TEE: 1. 28 mm aortic root graft. The root is thickened more than expected this  far out from surgery. 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 The  anterior aspect of  the root is not well visualized. Would recommend a  gated cardiac CT to better assess  for aortic root abscess. No signs of valvular vegetation. Aortic  root/ascending aorta has been repaired/replaced.   2. Left ventricular ejection fraction, by estimation, is 60 to 65%. The  left ventricle has normal function.   3. Right ventricular systolic function is normal. The right ventricular  size is normal.   4. No left atrial/left atrial appendage thrombus was detected. The LAA  emptying velocity was 54 cm/s.   5. The mitral valve is grossly normal. Mild mitral valve regurgitation.  No evidence of mitral stenosis.   6. The aortic valve has been repaired/replaced. Aortic valve  regurgitation is not visualized. There is a 25 mm Edwards bioprosthetic  valve present in the aortic position. Procedure Date: 07/02/17. Echo  findings are consistent with normal structure and  function of the aortic valve prosthesis.   7. Agitated saline contrast bubble study was negative, with no evidence  of any interatrial shunt.   CTA Heart: IMPRESSION: 1. Coronary calcium score of 3.9. This was 20th percentile for age-, sex, and race-matched controls.   2. S/p aortic valve replacement with 25 mm Magna and aortic root replacement with concerns for aortic root abscess described above.   3. S/p coronary reimplantation.   4. 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 Mid LAD myocardial bridge (normal)   5. Minimal CAD in the distal RCA (<25%). ADDENDUM: CT Imaging reviewed with CT surgery. Non-contrast studies show circumferential high attenuation material (HU=300-350). The circumferential material is likely consistent with pledget material from prior root replacement, and not aortic root abscess.  Would recommend repeat gated cardiac CT after antibiotics to ensure there is no change given bacteremia.  EKG:  EKG is ordered today.  The ekg ordered today demonstrates atrial flutter heart rate 80 bpm  Recent Labs: 10/27/2021: ALT 21 10/30/2021: Magnesium 1.6 11/05/2021: BUN 13; Creatinine, Ser 0.96; Hemoglobin 15.1; Platelets 276; Potassium 4.3; Sodium 138  Recent Lipid Panel    Component Value Date/Time   CHOL 185 02/17/2019 0714   CHOL 184 05/05/2017 0931   TRIG 234 (H) 02/17/2019 0714   HDL 42 02/17/2019 0714   HDL 45 05/05/2017 0931   CHOLHDL 4.4 02/17/2019 0714   VLDL 47 (H) 02/17/2019 0714   LDLCALC 96 02/17/2019 0714   LDLCALC 94 05/05/2017 0931     Risk Assessment/Calculations:    CHA2DS2-VASc Score = 2   This indicates a 2.2% annual risk of stroke. The patient's score is based upon: CHF History: 0 HTN History: 1 Diabetes History: 0 Stroke History: 0 Vascular Disease History: 0 Age Score: 1 Gender Score: 0          Physical Exam:    VS:  BP 120/80    Pulse 80    Ht 5\' 11"  (1.803 m)    Wt 235 lb 3.2 oz (106.7 kg)    SpO2 97%    BMI 32.80 kg/m     Wt Readings from Last 3 Encounters:  11/20/21 235 lb 3.2 oz (106.7 kg)  11/08/21 223 lb 6.4 oz (101.3 kg)  11/01/21 238 lb 1.6 oz (108 kg)     GEN:  Chronically ill-appearing male in no acute distress HEENT: Normal NECK: No JVD; No carotid bruits LYMPHATICS: No lymphadenopathy CARDIAC: RRR, 2/6 systolic murmur at the RUSB, no diastolic murmur RESPIRATORY:  Clear to auscultation without rales, wheezing or rhonchi  ABDOMEN: Soft, non-tender, non-distended MUSCULOSKELETAL:  Trace bilateral pretibial edema;  No deformity  SKIN: Warm and dry NEUROLOGIC:  Alert and oriented x 3 PSYCHIATRIC:  Normal affect   ASSESSMENT:    1. Typical atrial flutter ([place])   2. Chronic diastolic (congestive) heart failure (HCC)    PLAN:    In order of problems listed above:  The patient appears to have typical atrial  flutter with associated symptoms of heart palpitations and modest worsening of his fatigue and shortness of breath.  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  He has just completed gentamicin and continues on IV antibiotic therapy.  Chest x-ray performed today and demonstrates appropriate position of his PICC line at the cavoatrial junction.  There are chronic lung changes without any acute cardiopulmonary abnormality.  I reassured the patient's wife that I do not think his PICC line has anything to do with the development of atrial fibrillation/flutter.  Considering his complex medication regimen, I think it is best to avoid amiodarone or other antiarrhythmic drugs as an initial strategy.  The patient will continue rivaroxaban for anticoagulation and metoprolol for rate control.  I have recommended elective cardioversion.  Risks, indications, and alternatives to cardioversion were reviewed with the patient.  He has consistently taken rivaroxaban over recent months with only 1 missed dose.  We tried to arrange outpatient cardioversion, but the next available cardioversion slot is February 22.  Due to the patient's tenuous cardiac status, I think this is too long to wait.  I discussed his case with EP colleagues and I will try to get him put on for cardioversion in the EP lab in the next several days. Continue current medical therapy.  Most recent echocardiogram reviewed.  Plan elective cardioversion as soon as possible to prevent worsening heart failure symptoms.     Shared Decision Making/Informed Consent The risks (stroke, cardiac arrhythmias rarely resulting in the need for a temporary or permanent pacemaker, skin irritation or burns and complications associated with conscious sedation including aspiration, arrhythmia, respiratory failure and death), benefits (restoration of normal sinus rhythm) and alternatives of a direct current  cardioversion were explained in detail to Mr. Bera and he agrees to proceed.    Medication Adjustments/Labs and Tests Ordered: Current medicines are reviewed at length with the patient today.  Concerns regarding medicines are outlined above.  Orders Placed This Encounter  Procedures   DG Chest 2 View   EKG 12-Lead   No orders of the defined types were placed in this encounter.   Patient Instructions  Medication Instructions:  Your physician recommends that you continue on your current medications as directed. Please refer to the Current Medication list given to you today.  *If you need a refill on your cardiac medications before your next appointment, please call your pharmacy*   Lab Work: CBC, BMET within 7 days of procedure  If you have labs (blood work) drawn today and your tests are completely normal, you will receive your results only by: [place] (if you have MyChart) OR A paper copy in the mail If you have any lab test that is abnormal or we need to change your treatment, we will call you to review the results.   Testing/Procedures: Chest Xray- to be done at [place]  Electrical Cardioversion uses a jolt of electricity to your heart either through paddles or wired patches attached to your chest. This is a controlled, usually prescheduled, procedure. This procedure is done at the hospital and you are  not awake during the procedure. You usually go home the day of the procedure. Please see the instruction sheet given to you today for more information.    Follow-Up: At [facility] you and your health needs are our priority.  As part of our continuing mission to provide you with exceptional heart care, we have created designated Provider Care Teams.  These Care Teams include your primary Cardiologist (physician) and Advanced Practice Providers (APPs -  Physician Assistants and Nurse Practitioners) who all work together to provide you  with the care you need, when you need it.  We recommend signing up for the patient portal called "MyChart".  Sign up information is provided on this After Visit Summary.  MyChart is used to connect with patients for Virtual Visits (Telemedicine).  Patients are able to view lab/test results, encounter notes, upcoming appointments, etc.  Non-urgent messages can be sent to your provider as well.   To learn more about what you can do with MyChart, go to [email or web address].    Your next appointment:   Structural team will advise  Burt Knack, Chelbie Jarnagin}      Signed, Sherren Mocha, MD  11/21/2021 3:15 PM    [place]

## 2021-11-20 NOTE — Patient Instructions (Addendum)
Medication Instructions:  Your physician recommends that you continue on your current medications as directed. Please refer to the Current Medication list given to you today.  *If you need a refill on your cardiac medications before your next appointment, please call your pharmacy*   Lab Work: CBC, BMET within 7 days of procedure  If you have labs (blood work) drawn today and your tests are completely normal, you will receive your results only by: [place] (if you have MyChart) OR A paper copy in the mail If you have any lab test that is abnormal or we need to change your treatment, we will call you to review the results.   Testing/Procedures: Chest Xray- to be done at [place]  Electrical Cardioversion uses a jolt of electricity to your heart either through paddles or wired patches attached to your chest. This is a controlled, usually prescheduled, procedure. This procedure is done at the hospital and you are not awake during the procedure. You usually go home the day of the procedure. Please see the instruction sheet given to you today for more information.    Follow-Up: At [facility], you and your health needs are our priority.  As part of our continuing mission to provide you with exceptional heart care, we have created designated Provider Care Teams.  These Care Teams include your primary Cardiologist (physician) and Advanced Practice Providers (APPs -  Physician Assistants and Nurse Practitioners) who all work together to provide you with the care you need, when you need it.  We recommend signing up for the patient portal called "MyChart".  Sign up information is provided on this After Visit Summary.  MyChart is used to connect with patients for Virtual Visits (Telemedicine).  Patients are able to view lab/test results, encounter notes, upcoming appointments, etc.  Non-urgent messages can be sent to your provider as well.   To learn more about  what you can do with MyChart, go to [email or web address].    Your next appointment:   Structural team will advise  Grant [name], Grant [name]}

## 2021-11-20 NOTE — Progress Notes (Signed)
Cardiology Office Note:    Date:  11/21/2021   ID:  Grant [name], DOB 01-03-1951, MRN [id number]  PCP:  Shirline Frees, MD   [facility] Providers Cardiologist:  [name], MD     Referring MD: Shirline Frees, MD   Chief Complaint  Patient presents with   Shortness of Breath    History of Present Illness:    Grant [name] is a 71 y.o. male with a complicated cardiac history who presents today as an add-on for evaluation of heart palpitations.  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  He was admitted in January with a febrile illness and grew out strep mitis/oralis on blood cultures.  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  A gated cardiac CTA was also suspicious but felt that circumferential thickening could be related to pledget material from the time of surgery.  There is no pseudoaneurysm or obvious fistula seen.  The patient was followed by infectious disease and is currently being treated with IV antibiotics.  The patient has been feeling a "fluttering" in his chest now for about 4 days.  This started around the time that his PICC line was changed out.  He also complains of fatigue and shortness of breath, both of which are chronic.  He has had no increased leg edema, orthopnea, or PND.  No chest pain or pressure.  Past Medical History:  Diagnosis Date   Allergic rhinitis    Anxiety    Arthritis    Asthma    Bladder tumor    Chronic fatigue    COVID-19 05/22/2021   Depression    06/30/17 Pt denies being depressed, reports Effexor is taken for Chronic Fatigue    Dyspnea     Enlarged prostate    Fibromyalgia    GERD (gastroesophageal reflux disease)    Headache    History of chronic bronchitis    History of migraine    History of toxic encephalopathy    Hypothyroidism    OSA on CPAP    CPAP 14   PAF (paroxysmal atrial fibrillation) ([place]) CARDIOLOGIST -- DR [name]   DX OCT 2013   S/P AVR (aortic valve replacement) and aortoplasty    Sensitiveness to light    Unspecified essential hypertension    Urethral tumor    PROSTATIC    Past Surgical History:  Procedure Laterality Date   BENTALL PROCEDURE N/A 07/02/2017   Procedure: BENTALL PROCEDURE;  Surgeon: Ivin Poot, MD;  Location: [place];  Service: Open Heart Surgery;  Laterality: N/A;  WITH CIRC ARREST   BIOPSY  02/06/2021   Procedure: BIOPSY;  Surgeon: Clarene Essex, MD;  Location: [facility];  Service: Endoscopy;;   BUBBLE STUDY  11/01/2021   Procedure: BUBBLE STUDY;  Surgeon: Geralynn Rile, MD;  Location: [place];  Service: Cardiovascular;;   COLONOSCOPY     COLONOSCOPY WITH PROPOFOL N/A 02/06/2021   Procedure: COLONOSCOPY WITH PROPOFOL;  Surgeon: Clarene Essex, MD;  Location: [facility];  Service: Endoscopy;  Laterality: N/A;   CYSTOSCOPY W/ RETROGRADES Bilateral 06/15/2013   Procedure: CYSTOSCOPY WITH BILATERAL RETROGRADE PYELOGRAM  BLADDER BIOPSY, PROSTATIC URETHRAL BIOPSY, ;  Surgeon: Molli Hazard, MD;  Location: [place];  Service: Urology;  Laterality: Bilateral;   ESOPHAGOGASTRODUODENOSCOPY (EGD) WITH PROPOFOL N/A 03/11/2018   Procedure: ESOPHAGOGASTRODUODENOSCOPY (EGD) WITH PROPOFOL;  Surgeon: Clarene Essex, MD;  Location: [place];  Service: Endoscopy;  Laterality: N/A;   LAPAROSCOPIC CHOLECYSTECTOMY  01/14/2001   PROCTOSCOPY N/A 06/12/2021   Procedure: RIGID PROCTOSCOPY;  Surgeon:  Boston, MD;  Location: [facility];  Service: General;  Laterality: N/A;   RIGHT/LEFT HEART CATH AND CORONARY ANGIOGRAPHY N/A 06/12/2017   Procedure: RIGHT/LEFT HEART  CATH AND CORONARY ANGIOGRAPHY;  Surgeon: Larey Dresser, MD;  Location: [place] [facility];  Service: Cardiovascular;  Laterality: N/A;   TEE WITHOUT CARDIOVERSION N/A 07/02/2017   Procedure: TRANSESOPHAGEAL ECHOCARDIOGRAM (TEE);  Surgeon: Prescott Gum, Collier Salina, MD;  Location: [place];  Service: Open Heart Surgery;  Laterality: N/A;   TEE WITHOUT CARDIOVERSION N/A 11/01/2021   Procedure: TRANSESOPHAGEAL ECHOCARDIOGRAM (TEE);  Surgeon: Geralynn Rile, MD;  Location: [place];  Service: Cardiovascular;  Laterality: N/A;   UMBILICAL HERNIA REPAIR  01/14/2008    Current Medications: Current Meds  Medication Sig   albuterol (VENTOLIN HFA) 108 (90 Base) MCG/ACT inhaler INHALE 2 PUFFS INTO THE LUNGS EVERY 6 HOURS AS NEEDED FOR WHEEZING OR SHORTNESS OF BREATH (Patient taking differently: 2 puffs every 6 (six) hours as needed for shortness of breath.)   ALPRAZolam (XANAX) 0.25 MG tablet Take 1 tablet (0.25 mg total) by mouth 3 (three) times daily as needed for anxiety.   aluminum hydroxide-magnesium carbonate (GAVISCON) 95-358 MG/15ML SUSP Take 15 mLs by mouth as needed for indigestion or heartburn.   azelastine (ASTELIN) 137 MCG/SPRAY nasal spray Place 1-2 sprays into the nose 2 (two) times daily as needed for rhinitis or allergies.    benzonatate (TESSALON) 200 MG capsule Take 1 capsule (200 mg total) by mouth 3 (three) times daily as needed for cough.   budesonide-formoterol (SYMBICORT) 160-4.5 MCG/ACT inhaler Inhale 2 puffs into the lungs in the morning and at bedtime.   calcium carbonate (TUMS - DOSED IN MG ELEMENTAL CALCIUM) 500 MG chewable tablet Chew 500-1,000 mg by mouth daily as needed for indigestion or heartburn.   COLCRYS 0.6 MG tablet Take 0.6 mg by mouth in the morning.   Dextromethorphan-guaiFENesin (MUCINEX DM MAXIMUM STRENGTH) 60-1200 MG TB12 Take 1 tablet by mouth 2 (two) times daily as needed (congestion/cough).   diclofenac Sodium (VOLTAREN) 1 % GEL Apply 2 g topically daily as  needed (pain).   diphenhydrAMINE (BENADRYL) 25 MG tablet Take 50 mg by mouth daily as needed for allergies.   EPINEPHrine 0.3 mg/0.3 mL IJ SOAJ injection Inject 0.3 mLs (0.3 mg total) into the muscle as needed (for angioedema).   Erenumab-aooe (AIMOVIG) 140 MG/ML SOAJ Inject 140 mg into the skin every 28 (twenty-eight) days.   Ferrous Sulfate (IRON) 142 (45 Fe) MG TBCR Take 45 mg by mouth in the morning and at bedtime.   finasteride (PROSCAR) 5 MG tablet Take 5 mg by mouth at bedtime.    levothyroxine (SYNTHROID, LEVOTHROID) 112 MCG tablet Take 112 mcg by mouth daily before breakfast.    liothyronine (CYTOMEL) 25 MCG tablet Take 25 mcg by mouth daily before breakfast.   LORazepam (ATIVAN) 1 MG tablet Take 0.5-1 mg by mouth 2 (two) times daily as needed.   losartan (COZAAR) 50 MG tablet TAKE 1 TABLET BY MOUTH EVERY DAY (Patient taking differently: Take 50 mg by mouth daily.)   Menthol, Topical Analgesic, (BLUE-EMU MAXIMUM STRENGTH EX) Apply 1 application topically as needed (pain).  Methylcellulose, Laxative, (FIBER THERAPY) 500 MG TABS Take 500-1,000 mg by mouth daily.   metoprolol tartrate (LOPRESSOR) 50 MG tablet Take 1 tablet (50 mg total) by mouth 2 (two) times daily.   Multiple Vitamin (MULTIVITAMIN WITH MINERALS) TABS tablet Take 1 tablet by mouth daily.   NON FORMULARY Pt uses a cpap nightly   olopatadine (PATANOL) 0.1 % ophthalmic solution Place 1 drop into both eyes 2 (two) times daily as needed for allergies.   omeprazole (PRILOSEC) 40 MG capsule Take 1 capsule (40 mg total) by mouth 2 (two) times daily.   penicillin G potassium 60000 UNIT/ML 1000 mL   rivaroxaban (XARELTO) 20 MG TABS tablet Take 1 tablet (20 mg total) by mouth daily with supper.   rizatriptan (MAXALT) 10 MG tablet Take 1 tablet (10 mg total) by mouth as needed for migraine. May repeat in 2 hours if needed   saccharomyces boulardii (FLORASTOR) 250 MG capsule Take 250 mg by mouth daily as needed (when taking  antibiotics).   sodium chloride (OCEAN) 0.65 % SOLN nasal spray Place 1 spray into both nostrils as needed for congestion.   tamsulosin (FLOMAX) 0.4 MG CAPS capsule Take 0.4 mg by mouth in the morning and at bedtime.   Testosterone Cypionate 200 MG/ML SOLN Inject 100 Units into the muscle once a week. Fridays   venlafaxine (EFFEXOR-XR) 150 MG 24 hr capsule Take 150 mg by mouth daily with breakfast.     Allergies:   Perphenazine, Amiodarone, Codeine, and Tramadol   Social History   Socioeconomic History   Marital status: Married    Spouse name: Not on file   Number of children: Not on file   Years of education: Not on file   Highest education level: Not on file  Occupational History   Occupation: disabled    Comment: owner of bu[employer]  Tobacco Use   Smoking status: Former    Packs/day: 0.50    Years: 27.00    Pack years: 13.50    Types: Cigarettes    Start date: 1970    Quit date: 10/21/1979    Years since quitting: 42.1   Smokeless tobacco: Never  Vaping Use   Vaping Use: Never used  Substance and Sexual Activity   Alcohol use: Yes    Alcohol/week: 4.0 standard drinks    Types: 4 Cans of beer per week    Comment: drinks "as much as I could" - 2-3 beers up to 8-10   Drug use: Never   Sexual activity: Not on file  Other Topics Concern   Not on file  Social History Narrative   Right handed   One story home   Drinks no caffeine   Social Determinants of Health   Financial Resource Strain: Not on file  Food Insecurity: Not on file  Transportation Needs: Not on file  Physical Activity: Not on file  Stress: Not on file  Social Connections: Not on file     Family History: The patient's family history includes Aortic aneurysm (age of onset: 37) in his mother; Heart disease in an other family member; Other in his father.  ROS:   Please see the history of present illness.    All other systems reviewed and are negative.  EKGs/Labs/Other Studies Reviewed:    The  following studies were reviewed today: TEE: 1. 28 mm aortic root graft. The root is thickened more than expected this  far out from surgery. 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 The  anterior aspect of  the root is not well visualized. Would recommend a  gated cardiac CT to better assess  for aortic root abscess. No signs of valvular vegetation. Aortic  root/ascending aorta has been repaired/replaced.   2. Left ventricular ejection fraction, by estimation, is 60 to 65%. The  left ventricle has normal function.   3. Right ventricular systolic function is normal. The right ventricular  size is normal.   4. No left atrial/left atrial appendage thrombus was detected. The LAA  emptying velocity was 54 cm/s.   5. The mitral valve is grossly normal. Mild mitral valve regurgitation.  No evidence of mitral stenosis.   6. The aortic valve has been repaired/replaced. Aortic valve  regurgitation is not visualized. There is a 25 mm Edwards bioprosthetic  valve present in the aortic position. Procedure Date: 07/02/17. Echo  findings are consistent with normal structure and  function of the aortic valve prosthesis.   7. Agitated saline contrast bubble study was negative, with no evidence  of any interatrial shunt.   CTA Heart: IMPRESSION: 1. Coronary calcium score of 3.9. This was 20th percentile for age-, sex, and race-matched controls.   2. S/p aortic valve replacement with 25 mm Magna and aortic root replacement with concerns for aortic root abscess described above.   3. S/p coronary reimplantation.   4. 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 Mid LAD myocardial bridge (normal)   5. Minimal CAD in the distal RCA (<25%). ADDENDUM: CT Imaging reviewed with CT surgery. Non-contrast studies show circumferential high attenuation material (HU=300-350). The circumferential material is likely consistent with pledget material from prior root replacement, and not aortic root abscess.  Would recommend repeat gated cardiac CT after antibiotics to ensure there is no change given bacteremia.  EKG:  EKG is ordered today.  The ekg ordered today demonstrates atrial flutter heart rate 80 bpm  Recent Labs: 10/27/2021: ALT 21 10/30/2021: Magnesium 1.6 11/05/2021: BUN 13; Creatinine, Ser 0.96; Hemoglobin 15.1; Platelets 276; Potassium 4.3; Sodium 138  Recent Lipid Panel    Component Value Date/Time   CHOL 185 02/17/2019 0714   CHOL 184 05/05/2017 0931   TRIG 234 (H) 02/17/2019 0714   HDL 42 02/17/2019 0714   HDL 45 05/05/2017 0931   CHOLHDL 4.4 02/17/2019 0714   VLDL 47 (H) 02/17/2019 0714   LDLCALC 96 02/17/2019 0714   LDLCALC 94 05/05/2017 0931     Risk Assessment/Calculations:    CHA2DS2-VASc Score = 2   This indicates a 2.2% annual risk of stroke. The patient's score is based upon: CHF History: 0 HTN History: 1 Diabetes History: 0 Stroke History: 0 Vascular Disease History: 0 Age Score: 1 Gender Score: 0          Physical Exam:    VS:  BP 120/80    Pulse 80    Ht 5\' 11"  (1.803 m)    Wt 235 lb 3.2 oz (106.7 kg)    SpO2 97%    BMI 32.80 kg/m     Wt Readings from Last 3 Encounters:  11/20/21 235 lb 3.2 oz (106.7 kg)  11/08/21 223 lb 6.4 oz (101.3 kg)  11/01/21 238 lb 1.6 oz (108 kg)     GEN:  Chronically ill-appearing male in no acute distress HEENT: Normal NECK: No JVD; No carotid bruits LYMPHATICS: No lymphadenopathy CARDIAC: RRR, 2/6 systolic murmur at the RUSB, no diastolic murmur RESPIRATORY:  Clear to auscultation without rales, wheezing or rhonchi  ABDOMEN: Soft, non-tender, non-distended MUSCULOSKELETAL:  Trace bilateral pretibial edema;  No deformity  SKIN: Warm and dry NEUROLOGIC:  Alert and oriented x 3 PSYCHIATRIC:  Normal affect   ASSESSMENT:    1. Typical atrial flutter ([place])   2. Chronic diastolic (congestive) heart failure ([facility])    PLAN:    In order of problems listed above:  The patient appears to have typical atrial  flutter with associated symptoms of heart palpitations and modest worsening of his fatigue and shortness of breath.  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  He has just completed gentamicin and continues on IV antibiotic therapy.  Chest x-ray performed today and demonstrates appropriate position of his PICC line at the cavoatrial junction.  There are chronic lung changes without any acute cardiopulmonary abnormality.  I reassured the patient's wife that I do not think his PICC line has anything to do with the development of atrial fibrillation/flutter.  Considering his complex medication regimen, I think it is best to avoid amiodarone or other antiarrhythmic drugs as an initial strategy.  The patient will continue rivaroxaban for anticoagulation and metoprolol for rate control.  I have recommended elective cardioversion.  Risks, indications, and alternatives to cardioversion were reviewed with the patient.  He has consistently taken rivaroxaban over recent months with only 1 missed dose.  We tried to arrange outpatient cardioversion, but the next available cardioversion slot is February 22.  Due to the patient's tenuous cardiac status, I think this is too long to wait.  I discussed his case with EP colleagues and I will try to get him put on for cardioversion in the EP lab in the next several days. Continue current medical therapy.  Most recent echocardiogram reviewed.  Plan elective cardioversion as soon as possible to prevent worsening heart failure symptoms.     Shared Decision Making/Informed Consent The risks (stroke, cardiac arrhythmias rarely resulting in the need for a temporary or permanent pacemaker, skin irritation or burns and complications associated with conscious sedation including aspiration, arrhythmia, respiratory failure and death), benefits (restoration of normal sinus rhythm) and alternatives of a direct current  cardioversion were explained in detail to Grant [name] and he agrees to proceed.    Medication Adjustments/Labs and Tests Ordered: Current medicines are reviewed at length with the patient today.  Concerns regarding medicines are outlined above.  Orders Placed This Encounter  Procedures   DG Chest 2 View   EKG 12-Lead   No orders of the defined types were placed in this encounter.   Patient Instructions  Medication Instructions:  Your physician recommends that you continue on your current medications as directed. Please refer to the Current Medication list given to you today.  *If you need a refill on your cardiac medications before your next appointment, please call your pharmacy*   Lab Work: CBC, BMET within 7 days of procedure  If you have labs (blood work) drawn today and your tests are completely normal, you will receive your results only by: [place] (if you have MyChart) OR A paper copy in the mail If you have any lab test that is abnormal or we need to change your treatment, we will call you to review the results.   Testing/Procedures: Chest Xray- to be done at [place]  Electrical Cardioversion uses a jolt of electricity to your heart either through paddles or wired patches attached to your chest. This is a controlled, usually prescheduled, procedure. This procedure is done at the hospital and you are  not awake during the procedure. You usually go home the day of the procedure. Please see the instruction sheet given to you today for more information.    Follow-Up: At [facility], you and your health needs are our priority.  As part of our continuing mission to provide you with exceptional heart care, we have created designated Provider Care Teams.  These Care Teams include your primary Cardiologist (physician) and Advanced Practice Providers (APPs -  Physician Assistants and Nurse Practitioners) who all work together to provide you  with the care you need, when you need it.  We recommend signing up for the patient portal called "MyChart".  Sign up information is provided on this After Visit Summary.  MyChart is used to connect with patients for Virtual Visits (Telemedicine).  Patients are able to view lab/test results, encounter notes, upcoming appointments, etc.  Non-urgent messages can be sent to your provider as well.   To learn more about what you can do with MyChart, go to [email or web address].    Your next appointment:   Structural team will advise  Grant [name], Grant [name]}      Signed, Sherren Mocha, MD  11/21/2021 3:15 PM    [place]

## 2021-11-21 ENCOUNTER — Telehealth: Payer: Self-pay | Admitting: Cardiovascular Disease

## 2021-11-21 ENCOUNTER — Telehealth: Payer: Self-pay

## 2021-11-21 ENCOUNTER — Encounter: Payer: Self-pay | Admitting: Cardiovascular Disease

## 2021-11-21 DIAGNOSIS — A409 Streptococcal sepsis, unspecified: Secondary | ICD-10-CM | POA: Diagnosis not present

## 2021-11-21 DIAGNOSIS — R7881 Bacteremia: Secondary | ICD-10-CM | POA: Diagnosis not present

## 2021-11-21 NOTE — Telephone Encounter (Signed)
Scheduled DCCV with Dr. Curt Bears on Feb 7 with arrival time of 12 pm. Patient's wife verbalized understanding and agreement after reviewed mychart message with instructions.

## 2021-11-21 NOTE — Telephone Encounter (Signed)
Spoke with pt's wife, DPR who is requesting pt's cardioversion be moved up.  She reports pt continues to feel the fluttering and is complaining of severe fatigue today.  She denies pt is currently having any CP, SOB, rapid heart rate, dizziness/fainting.  Pt is taking his Metoprolol and Xarelto as prescribed.  She admits she and pt are "scared" since he has not had any problems with an arrhythmia since 2018. RN offered reassurance.  Advised pt's HR is well controlled at 74 at this time which is important.  Encouraged to continue medications as prescribed.  Reviewed ED precautions.  Will forward request to Dr Burt Knack and [name] but advised as of pt's appointment yesterday the earliest available outpatient cardioversion date was 12/11/2021.  [name] verbalizes understanding and is agreeable to current plan.

## 2021-11-21 NOTE — Telephone Encounter (Signed)
Patient c/o Palpitations:  High priority if patient c/o lightheadedness, shortness of breath, or chest pain  How long have you had palpitations/irregular HR/ Afib? A-fib unsure.  Was in A-fib in office yesterday.  Are you having the symptoms now? yes  Are you currently experiencing lightheadedness, SOB or CP? yes  Do you have a history of afib (atrial fibrillation) or irregular heart rhythm? yes  Have you checked your BP or HR? (document readings if available): 120/78 HR 74 O2 99  Are you experiencing any other symptoms? Rapid HR when he stand up and has flutter. Same symptoms as yesterday.  Wife states she thinks she needs to get cardioversion moved up.

## 2021-11-21 NOTE — Telephone Encounter (Signed)
Patient's wife left voicemail wanting to let Dr. Candiss Norse know that the patient is in Afib and has cardioversion scheduled for 12/11/21. They are trying to move this date up with cardiology.   She says the patient never experienced any symptoms related to Afib until his new PICC line was placed 11/13/21.   Patient's wife states that the home health nurse reported that patient's labs were "excellent."   She would like to know if any changes need to be made to patient's antibiotic plan with regards to the Afib. Will route to provider.   Beryle Flock, RN

## 2021-11-21 NOTE — Telephone Encounter (Signed)
I spoke to Dr Quentin Ore and Dr Curt Bears this am. They offered to cardiovert the patient on one of their upcoming EP days after they are done with ablations. That would probably be easiest for the patient and his wife. They are very anxious but don't need to go to the ER unless something has changed. His HR is 80 bpm. Rosann Auerbach can you work with [facility] or [facility]'s nurse to set this up? They will do with anesthesia in the EP lab at the end of their EP hospital day.  thx

## 2021-11-22 ENCOUNTER — Encounter: Payer: Self-pay | Admitting: *Deleted

## 2021-11-22 DIAGNOSIS — R7881 Bacteremia: Secondary | ICD-10-CM | POA: Diagnosis not present

## 2021-11-22 NOTE — Telephone Encounter (Signed)
Spoke to wife, POA. Aware DCCV scheduled for next Tuesday 2/7 has had to be rescheduled to 2/9. Aware instructions will be updated and sent via mychart. Wife verbalized understanding and agreeable to plan.

## 2021-11-23 DIAGNOSIS — R7881 Bacteremia: Secondary | ICD-10-CM | POA: Diagnosis not present

## 2021-11-24 DIAGNOSIS — R7881 Bacteremia: Secondary | ICD-10-CM | POA: Diagnosis not present

## 2021-11-25 DIAGNOSIS — A409 Streptococcal sepsis, unspecified: Secondary | ICD-10-CM | POA: Diagnosis not present

## 2021-11-25 DIAGNOSIS — R7881 Bacteremia: Secondary | ICD-10-CM | POA: Diagnosis not present

## 2021-11-26 ENCOUNTER — Other Ambulatory Visit: Payer: Self-pay

## 2021-11-26 ENCOUNTER — Telehealth (INDEPENDENT_AMBULATORY_CARE_PROVIDER_SITE_OTHER): Payer: Medicare Other | Admitting: Internal Medicine

## 2021-11-26 DIAGNOSIS — I38 Endocarditis, valve unspecified: Secondary | ICD-10-CM

## 2021-11-26 DIAGNOSIS — R7881 Bacteremia: Secondary | ICD-10-CM | POA: Diagnosis not present

## 2021-11-26 DIAGNOSIS — T826XXS Infection and inflammatory reaction due to cardiac valve prosthesis, sequela: Secondary | ICD-10-CM | POA: Diagnosis not present

## 2021-11-26 DIAGNOSIS — I483 Typical atrial flutter: Secondary | ICD-10-CM

## 2021-11-26 NOTE — Progress Notes (Addendum)
Subjective:    Patient ID: Grant [name], male    DOB: [birth year], 71 y.o.   MRN: [id number]      Virtual Visit via Telephone/Video Note   I connected withNAME@ on 11/26/2021 at 11:02 AM by telephone and verified that I am speaking with the correct person using two identifiers.   I discussed the limitations, risks, security and privacy concerns of performing an evaluation and management service by telephone and the availability of in person appointments. I also discussed with the patient that there may be a patient responsible charge related to this service. The patient expressed understanding and agreed to proceed.  Location:  Patient: Home Provider: [facility]   HPI:  Grant [name] is a 71 y.o. 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 HE presents for concerns of Afib 2/2 PICC line. He had  a bleeding PICC line. Seen by my partner Dr. Alphonsa Gin and placed on amoxicillin while awaiting line placement. He had a new PICC placed on 11/13/21. He developed a flutter. Seen by Dr. Copper, Cardiology. CXR done and noted appropriate position of PICC and reassured that PICC line is not related to afib/aflutter. Today: He and wife had similar concerns with PICC leading to aflutter.    Allergies  Allergen Reactions   Perphenazine Swelling    Tongue swelling   Amiodarone Swelling   Codeine Itching   Tramadol Other (See Comments)    dellusion       Outpatient Medications Prior to Visit  Medication Sig Dispense Refill   albuterol (VENTOLIN HFA) 108 (90 Base) MCG/ACT inhaler INHALE 2 PUFFS INTO THE LUNGS EVERY 6 HOURS AS NEEDED FOR WHEEZING OR SHORTNESS OF BREATH (Patient taking differently: 2 puffs every 6 (six) hours as needed for shortness of breath.) 18 g 5   ALPRAZolam (XANAX) 0.25 MG tablet Take 1 tablet (0.25 mg total) by mouth 3 (three) times daily as needed for anxiety. 15 tablet  0   aluminum hydroxide-magnesium carbonate (GAVISCON) 95-358 MG/15ML SUSP Take 15 mLs by mouth 3 (three) times daily as needed for indigestion or heartburn.     amoxicillin (AMOXIL) 500 MG capsule Take 2 capsules (1,000 mg total) by mouth 3 (three) times daily. (Patient not taking: Reported on 11/22/2021) 60 capsule 0   azelastine (ASTELIN) 137 MCG/SPRAY nasal spray Place 1-2 sprays into the nose 2 (two) times daily as needed for rhinitis or allergies.      benzonatate (TESSALON) 200 MG capsule Take 1 capsule (200 mg total) by mouth 3 (three) times daily as needed for cough. 60 capsule 3   budesonide-formoterol (SYMBICORT) 160-4.5 MCG/ACT inhaler Inhale 2 puffs into the lungs in the morning and at bedtime. 1 each 5   calcium carbonate (TUMS - DOSED IN MG ELEMENTAL CALCIUM) 500 MG chewable tablet Chew 1-2 mg by mouth 3 (three) times daily as needed for indigestion or heartburn.     Clobetasol Prop Emollient Base 0.05 % emollient cream Apply 1 application topically daily as needed (skin irritation/rash.).     COLCRYS 0.6 MG tablet Take 0.6 mg by mouth in the morning.     Dextromethorphan-guaiFENesin (MUCINEX DM MAXIMUM STRENGTH) 60-1200 MG TB12 Take 1 tablet by mouth 2 (two) times daily as needed (congestion/cough).     diclofenac Sodium (VOLTAREN) 1 % GEL Apply 2 g topically 3 (three) times daily as needed (arthritis pain.).     diphenhydrAMINE (BENADRYL) 25 mg capsule Take 50  mg by mouth daily as needed (allergies.).     EPINEPHrine 0.3 mg/0.3 mL IJ SOAJ injection Inject 0.3 mLs (0.3 mg total) into the muscle as needed (for angioedema). 2 Device 0   Erenumab-aooe (AIMOVIG) 140 MG/ML SOAJ Inject 140 mg into the skin every 28 (twenty-eight) days. 1 mL 11   Ferrous Sulfate (IRON) 142 (45 Fe) MG TBCR Take 45 mg by mouth in the morning and at bedtime.     finasteride (PROSCAR) 5 MG tablet Take 5 mg by mouth at bedtime.      levothyroxine (SYNTHROID, LEVOTHROID) 112 MCG tablet Take 112 mcg by mouth daily  before breakfast.      liothyronine (CYTOMEL) 25 MCG tablet Take 25 mcg by mouth daily before breakfast.     LORazepam (ATIVAN) 1 MG tablet Take 0.5-1 mg by mouth 2 (two) times daily as needed for anxiety.     losartan (COZAAR) 50 MG tablet TAKE 1 TABLET BY MOUTH EVERY DAY 30 tablet 6   Menthol, Topical Analgesic, (BLUE-EMU MAXIMUM STRENGTH EX) Apply 1 application topically 3 (three) times daily as needed (arthritis pain.).     Methylcellulose, Laxative, (FIBER THERAPY) 500 MG TABS Take 1,000 mg by mouth in the morning.     metoprolol tartrate (LOPRESSOR) 50 MG tablet Take 1 tablet (50 mg total) by mouth 2 (two) times daily. 180 tablet 3   Multiple Vitamin (MULTIVITAMIN WITH MINERALS) TABS tablet Take 1 tablet by mouth in the morning.     NON FORMULARY Pt uses a cpap nightly     olopatadine (PATANOL) 0.1 % ophthalmic solution Place 1 drop into both eyes 2 (two) times daily as needed for allergies.     omeprazole (PRILOSEC) 40 MG capsule Take 1 capsule (40 mg total) by mouth 2 (two) times daily. 180 capsule 1   penicillin G potassium 60000 UNIT/ML Inject 24 Million Units into the vein continuous.     rivaroxaban (XARELTO) 20 MG TABS tablet Take 1 tablet (20 mg total) by mouth daily with supper. 90 tablet 3   rizatriptan (MAXALT) 10 MG tablet Take 1 tablet (10 mg total) by mouth as needed for migraine. May repeat in 2 hours if needed 10 tablet 11   saccharomyces boulardii (FLORASTOR) 250 MG capsule Take 250 mg by mouth daily as needed (when taking antibiotics).     sodium chloride (OCEAN) 0.65 % SOLN nasal spray Place 1 spray into both nostrils as needed for congestion.     tamsulosin (FLOMAX) 0.4 MG CAPS capsule Take 0.4 mg by mouth in the morning and at bedtime.     Testosterone Cypionate 200 MG/ML SOLN Inject 100 mg into the muscle every Friday.     venlafaxine (EFFEXOR-XR) 150 MG 24 hr capsule Take 150 mg by mouth daily with breakfast.     zolpidem (AMBIEN) 10 MG tablet Take 10 mg by mouth at  bedtime as needed for sleep.     No facility-administered medications prior to visit.     Past Medical History:  Diagnosis Date   Allergic rhinitis    Anxiety    Arthritis    Asthma    Bladder tumor    Chronic fatigue    COVID-19 05/22/2021   Depression    06/30/17 Pt denies being depressed, reports Effexor is taken for Chronic Fatigue    Dyspnea    Enlarged prostate    Fibromyalgia    GERD (gastroesophageal reflux disease)    Headache    History of chronic bronchitis  History of migraine    History of toxic encephalopathy    Hypothyroidism    OSA on CPAP    CPAP 14   PAF (paroxysmal atrial fibrillation) (HCC) CARDIOLOGIST -- DR [name]   DX OCT 2013   S/P AVR (aortic valve replacement) and aortoplasty    Sensitiveness to light    Unspecified essential hypertension    Urethral tumor    PROSTATIC     Past Surgical History:  Procedure Laterality Date   BENTALL PROCEDURE N/A 07/02/2017   Procedure: BENTALL PROCEDURE;  Surgeon: Ivin Poot, MD;  Location: [place];  Service: Open Heart Surgery;  Laterality: N/A;  WITH CIRC ARREST   BIOPSY  02/06/2021   Procedure: BIOPSY;  Surgeon: Clarene Essex, MD;  Location: [facility];  Service: Endoscopy;;   BUBBLE STUDY  11/01/2021   Procedure: BUBBLE STUDY;  Surgeon: Geralynn Rile, MD;  Location: [place];  Service: Cardiovascular;;   COLONOSCOPY     COLONOSCOPY WITH PROPOFOL N/A 02/06/2021   Procedure: COLONOSCOPY WITH PROPOFOL;  Surgeon: Clarene Essex, MD;  Location: [facility];  Service: Endoscopy;  Laterality: N/A;   CYSTOSCOPY W/ RETROGRADES Bilateral 06/15/2013   Procedure: CYSTOSCOPY WITH BILATERAL RETROGRADE PYELOGRAM  BLADDER BIOPSY, PROSTATIC URETHRAL BIOPSY, ;  Surgeon: Molli Hazard, MD;  Location: [facility];  Service: Urology;  Laterality: Bilateral;   ESOPHAGOGASTRODUODENOSCOPY (EGD) WITH PROPOFOL N/A 03/11/2018   Procedure: ESOPHAGOGASTRODUODENOSCOPY (EGD) WITH PROPOFOL;   Surgeon: Clarene Essex, MD;  Location: [place];  Service: Endoscopy;  Laterality: N/A;   LAPAROSCOPIC CHOLECYSTECTOMY  01/14/2001   PROCTOSCOPY N/A 06/12/2021   Procedure: RIGID PROCTOSCOPY;  Surgeon: [name], MD;  Location: [facility];  Service: General;  Laterality: N/A;   RIGHT/LEFT HEART CATH AND CORONARY ANGIOGRAPHY N/A 06/12/2017   Procedure: RIGHT/LEFT HEART CATH AND CORONARY ANGIOGRAPHY;  Surgeon: Larey Dresser, MD;  Location: [place] [facility];  Service: Cardiovascular;  Laterality: N/A;   TEE WITHOUT CARDIOVERSION N/A 07/02/2017   Procedure: TRANSESOPHAGEAL ECHOCARDIOGRAM (TEE);  Surgeon: Prescott Gum, Collier Salina, MD;  Location: [place];  Service: Open Heart Surgery;  Laterality: N/A;   TEE WITHOUT CARDIOVERSION N/A 11/01/2021   Procedure: TRANSESOPHAGEAL ECHOCARDIOGRAM (TEE);  Surgeon: Geralynn Rile, MD;  Location: [place];  Service: Cardiovascular;  Laterality: N/A;   UMBILICAL HERNIA REPAIR  01/14/2008       Review of Systems    Objective:    Nursing note and vital signs reviewed.     Assessment & Plan:  #Strep mitis/oralis bacteremia with prosthetic aortic root  infective endocarditis #Concern for prosthetic aortic root abscess -Continue penicillin(anticipate 6 weeks of antibiotics from negative Cx EOT 12/16/21-extended due to PICC line issues) -Gentamicin x 2 weeks for synergy(EOT 11/17/21) -Agree with CT srugery follow-up -Follow-up with mid March for labs off of antibiotics  #Aflutter/Afib -Followed by Cardiology Dr. Burt Knack -Plan on cardioversion -Agree that unlikely PICC is leading to afib    Total time: 23 minutes

## 2021-11-27 DIAGNOSIS — R7881 Bacteremia: Secondary | ICD-10-CM | POA: Diagnosis not present

## 2021-11-28 ENCOUNTER — Ambulatory Visit (HOSPITAL_BASED_OUTPATIENT_CLINIC_OR_DEPARTMENT_OTHER): Payer: Medicare Other | Admitting: Critical Care Medicine

## 2021-11-28 ENCOUNTER — Encounter (HOSPITAL_COMMUNITY)
Admission: RE | Disposition: A | Discharge status: Home / Self Care | Payer: Self-pay | Source: Home / Self Care | Attending: Cardiology

## 2021-11-28 ENCOUNTER — Ambulatory Visit (HOSPITAL_COMMUNITY)
Admission: RE | Admit: 2021-11-28 | Discharge: 2021-11-28 | Disposition: A | Discharge status: Home / Self Care | Payer: Medicare Other | Attending: Cardiology | Admitting: Cardiology

## 2021-11-28 ENCOUNTER — Ambulatory Visit (HOSPITAL_COMMUNITY): Payer: Medicare Other | Admitting: Critical Care Medicine

## 2021-11-28 ENCOUNTER — Encounter (HOSPITAL_COMMUNITY): Payer: Self-pay | Admitting: Cardiology

## 2021-11-28 DIAGNOSIS — Z952 Presence of prosthetic heart valve: Secondary | ICD-10-CM | POA: Insufficient documentation

## 2021-11-28 DIAGNOSIS — E039 Hypothyroidism, unspecified: Secondary | ICD-10-CM | POA: Diagnosis not present

## 2021-11-28 DIAGNOSIS — Z7901 Long term (current) use of anticoagulants: Secondary | ICD-10-CM | POA: Diagnosis not present

## 2021-11-28 DIAGNOSIS — R7881 Bacteremia: Secondary | ICD-10-CM | POA: Diagnosis not present

## 2021-11-28 DIAGNOSIS — Z87891 Personal history of nicotine dependence: Secondary | ICD-10-CM | POA: Diagnosis not present

## 2021-11-28 DIAGNOSIS — I4819 Other persistent atrial fibrillation: Secondary | ICD-10-CM | POA: Insufficient documentation

## 2021-11-28 DIAGNOSIS — I5032 Chronic diastolic (congestive) heart failure: Secondary | ICD-10-CM | POA: Diagnosis not present

## 2021-11-28 DIAGNOSIS — F418 Other specified anxiety disorders: Secondary | ICD-10-CM

## 2021-11-28 DIAGNOSIS — I4892 Unspecified atrial flutter: Secondary | ICD-10-CM | POA: Diagnosis not present

## 2021-11-28 DIAGNOSIS — I483 Typical atrial flutter: Secondary | ICD-10-CM | POA: Diagnosis not present

## 2021-11-28 DIAGNOSIS — I10 Essential (primary) hypertension: Secondary | ICD-10-CM

## 2021-11-28 DIAGNOSIS — I110 Hypertensive heart disease with heart failure: Secondary | ICD-10-CM | POA: Diagnosis not present

## 2021-11-28 DIAGNOSIS — G4733 Obstructive sleep apnea (adult) (pediatric): Secondary | ICD-10-CM | POA: Diagnosis not present

## 2021-11-28 HISTORY — PX: CARDIOVERSION: EP1203

## 2021-11-28 SURGERY — CARDIOVERSION (CATH LAB)
Anesthesia: General

## 2021-11-28 MED ORDER — PROPOFOL 10 MG/ML IV BOLUS
INTRAVENOUS | Status: DC | PRN
  Administered 2021-11-28 (×3): 20 mg via INTRAVENOUS
  Administered 2021-11-28: 60 mg via INTRAVENOUS

## 2021-11-28 MED ORDER — SODIUM CHLORIDE 0.9 % IV SOLN: INTRAVENOUS | Status: DC

## 2021-11-28 MED ORDER — LIDOCAINE 2% (20 MG/ML) 5 ML SYRINGE
INTRAMUSCULAR | Status: DC | PRN
Start: 2021-11-28 — End: 2021-11-28
  Administered 2021-11-28: 60 mg via INTRAVENOUS

## 2021-11-28 SURGICAL SUPPLY — 1 items: PAD DEFIB LIFELINK (PAD) ×1 IMPLANT

## 2021-11-28 NOTE — Anesthesia Preprocedure Evaluation (Signed)
Anesthesia Evaluation  Patient identified by MRN, date of birth, ID band Patient awake    Reviewed: Allergy & Precautions, NPO status , Patient's Chart, lab work & pertinent test results  History of Anesthesia Complications Negative for: history of anesthetic complications  Airway Mallampati: III  TM Distance: >3 FB Neck ROM: Full    Dental  (+) Dental Advisory Given, Teeth Intact   Pulmonary shortness of breath, sleep apnea , former smoker,    breath sounds clear to auscultation       Cardiovascular hypertension, Pt. on medications and Pt. on home beta blockers  Rhythm:Irregular     Neuro/Psych  Headaches, PSYCHIATRIC DISORDERS Anxiety Depression  Neuromuscular disease    GI/Hepatic Neg liver ROS, GERD  ,  Endo/Other  Hypothyroidism   Renal/GU Lab Results      Component                Value               Date                      CREATININE               0.96                11/05/2021                Musculoskeletal  (+) Arthritis , Fibromyalgia -  Abdominal   Peds  Hematology  (+) Blood dyscrasia, , Lab Results      Component                Value               Date                      WBC                      11.4 (H)            11/05/2021                HGB                      15.1                11/05/2021                HCT                      43.9                11/05/2021                MCV                      93.0                11/05/2021                PLT                      276                 11/05/2021              Anesthesia Other Findings   Reproductive/Obstetrics  Anesthesia Physical Anesthesia Plan  ASA: 3  Anesthesia Plan: General   Post-op Pain Management: Minimal or no pain anticipated   Induction: Intravenous  PONV Risk Score and Plan: 1 and Treatment may vary due to age or medical condition  Airway Management Planned: Nasal  Cannula and Mask  Additional Equipment: None  Intra-op Plan:   Post-operative Plan:   Informed Consent: I have reviewed the patients History and Physical, chart, labs and discussed the procedure including the risks, benefits and alternatives for the proposed anesthesia with the patient or authorized representative who has indicated his/her understanding and acceptance.     Dental advisory given  Plan Discussed with: CRNA and Anesthesiologist  Anesthesia Plan Comments:         Anesthesia Quick Evaluation

## 2021-11-28 NOTE — Anesthesia Procedure Notes (Signed)
Procedure Name: General with mask airway Date/Time: 11/28/2021 9:53 AM Performed by: Wilburn Cornelia, CRNA Pre-anesthesia Checklist: Patient identified, Emergency Drugs available, Suction available, Patient being monitored and Timeout performed Patient Re-evaluated:Patient Re-evaluated prior to induction Oxygen Delivery Method: Ambu bag Preoxygenation: Pre-oxygenation with 100% oxygen Induction Type: IV induction Ventilation: Mask ventilation without difficulty Placement Confirmation: breath sounds checked- equal and bilateral Dental Injury: Teeth and Oropharynx as per pre-operative assessment

## 2021-11-28 NOTE — Transfer of Care (Signed)
Immediate Anesthesia Transfer of Care Note  Patient: Grant [name]  Procedure(s) Performed: CARDIOVERSION (CATH LAB)  Patient Location: Cath Lab  Anesthesia Type:General  Level of Consciousness: awake and alert   Airway & Oxygen Therapy: Patient Spontanous Breathing  Post-op Assessment: Report given to RN and Post -op Vital signs reviewed and stable  Post vital signs: Reviewed and stable  Last Vitals:  Vitals Value Taken Time  BP 111/79   Temp    Pulse 63   Resp 14   SpO2 96     Last Pain:  Vitals:   11/28/21 0741  TempSrc: Oral         Complications: No notable events documented.

## 2021-11-28 NOTE — Interval H&P Note (Signed)
History and Physical Interval Note:  11/28/2021 9:18 AM  Grant [name]  has presented today for surgery, with the diagnosis of Atrial Flutter.  The various methods of treatment have been discussed with the patient and family. After consideration of risks, benefits and other options for treatment, the patient has consented to  Procedure(s): CARDIOVERSION (CATH LAB) (N/A) as a surgical intervention.  The patient's history has been reviewed, patient examined, no change in status, stable for surgery.  I have reviewed the patient's chart and labs.  Questions were answered to the patient's satisfaction.     Grant [name]

## 2021-11-29 DIAGNOSIS — R7881 Bacteremia: Secondary | ICD-10-CM | POA: Diagnosis not present

## 2021-11-30 DIAGNOSIS — R7881 Bacteremia: Secondary | ICD-10-CM | POA: Diagnosis not present

## 2021-12-01 DIAGNOSIS — R7881 Bacteremia: Secondary | ICD-10-CM | POA: Diagnosis not present

## 2021-12-02 ENCOUNTER — Telehealth: Payer: Self-pay

## 2021-12-02 DIAGNOSIS — A409 Streptococcal sepsis, unspecified: Secondary | ICD-10-CM | POA: Diagnosis not present

## 2021-12-02 DIAGNOSIS — R7881 Bacteremia: Secondary | ICD-10-CM | POA: Diagnosis not present

## 2021-12-02 NOTE — Anesthesia Postprocedure Evaluation (Signed)
Anesthesia Post Note  Patient: Audy Dauphine Birnie  Procedure(s) Performed: CARDIOVERSION (CATH LAB)     Patient location during evaluation: Endoscopy Anesthesia Type: General Level of consciousness: awake and alert Pain management: pain level controlled Vital Signs Assessment: post-procedure vital signs reviewed and stable Respiratory status: spontaneous breathing, nonlabored ventilation and respiratory function stable Cardiovascular status: blood pressure returned to baseline and stable Postop Assessment: no apparent nausea or vomiting Anesthetic complications: no   No notable events documented.  Last Vitals:  Vitals:   11/28/21 1115 11/28/21 1130  BP: (!) 142/76 140/76  Pulse: 82 81  Resp:    Temp:    SpO2: 93% 100%    Last Pain:  Vitals:   11/28/21 1048  TempSrc:   PainSc: 0-No pain                 Harlan Ervine

## 2021-12-02 NOTE — Telephone Encounter (Signed)
Received call today from Teressa Lower with [facility] regarding lab orders. States that she spoke with someone at our office to have orders faxed. Has not received them yet; was calling to follow up on order status.  Orders refaxed this morning.  F: [place], [place]

## 2021-12-03 DIAGNOSIS — R7881 Bacteremia: Secondary | ICD-10-CM | POA: Diagnosis not present

## 2021-12-03 NOTE — Progress Notes (Signed)
Date:  12/06/2021   ID:  Grant [name], DOB April 20, 1951, MRN [id number]  PCP:  Shirline Frees, MD  Cardiologist: Dr. [name]  EP: Dr. [name]  Chief Complaint: PAF/AVR   History of Present Illness:   71 y.o. history of PAF, HTN, OSA on CPAP with AVR PVT  for bicuspid valve 2018 [place]  (13mm Edwards pericardial Magna Ease)  No CAD at cath done 06/12/17 prior to surgery   Had recurrent PAF 07/28/17 converted with iv cardizem and seen by EP Dr [name] who recommended amiodarone 200 mg daily and changed back to  xarelto for anticoagulation   History of  angioedema. 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 Tested preoperative positive for COVID 05/22/21 Surgery rescheduled for 06/12/21 He has had recurrent diverticulitis with stricture   TTE: 05/01/21 EF 50-55% chronically mildly elevated gradients 25 mm [facility] Ease valve mean 21 mmHg   He was admitted in January with a febrile illness and grew out strep mitis/oralis on blood cultures.  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  A gated cardiac CTA was also suspicious but felt that circumferential thickening could be related to pledget material from the time of surgery.  D/c after prolonged hospital stay 11/05/21 Developed PAF/flutter around the time of PIC line change CXR showed PIC line at cavoatrial junction Rx xarelto and lopressor for rate control He was cardioverted in the EP lab by Dr  Quentin Ore on 11/28/21   [facility] getting antibiotics till end of month In retrospect thinks infection Was from biting his tongue hard prior to illness     Past Medical History:  Diagnosis Date   Allergic rhinitis    Anxiety    Arthritis    Asthma    Bladder tumor    Chronic fatigue    COVID-19 05/22/2021   Depression    06/30/17 Pt denies being depressed, reports Effexor is taken for Chronic Fatigue    Dyspnea    Enlarged prostate    Fibromyalgia    GERD (gastroesophageal reflux disease)    Headache    History of chronic bronchitis    History of migraine    History of toxic encephalopathy    Hypothyroidism    OSA on CPAP    CPAP 14   PAF (paroxysmal atrial fibrillation) ([place]) CARDIOLOGIST -- DR [name]   DX OCT 2013   S/P AVR (aortic valve replacement) and aortoplasty    Sensitiveness to light    Unspecified essential hypertension    Urethral tumor    PROSTATIC    Past Surgical History:  Procedure Laterality Date   BENTALL PROCEDURE N/A 07/02/2017   Procedure: BENTALL PROCEDURE;  Surgeon: Ivin Poot, MD;  Location: [place];  Service: Open Heart Surgery;  Laterality: N/A;  WITH CIRC ARREST   BIOPSY  02/06/2021   Procedure: BIOPSY;  Surgeon: Clarene Essex, MD;  [facility]: [facility];  Service:  Endoscopy;;   BUBBLE STUDY  11/01/2021   Procedure: BUBBLE STUDY;  Surgeon: Geralynn Rile, MD;  Location: [place];  Service: Cardiovascular;;   CARDIOVERSION N/A 11/28/2021   Procedure: CARDIOVERSION (CATH LAB);  Surgeon: Vickie Epley, MD;  Location: [place] CV LAB;  Service: Cardiovascular;  Laterality: N/A;   COLONOSCOPY     COLONOSCOPY WITH PROPOFOL N/A 02/06/2021   Procedure: COLONOSCOPY WITH PROPOFOL;  Surgeon: Clarene Essex, MD;  [facility];  Service: Endoscopy;  Laterality: N/A;   CYSTOSCOPY W/ RETROGRADES Bilateral 06/15/2013   Procedure: CYSTOSCOPY WITH BILATERAL RETROGRADE PYELOGRAM  BLADDER BIOPSY, PROSTATIC URETHRAL  BIOPSY, ;  Surgeon: Molli Hazard, MD;  Location: [facility];  Service: Urology;  Laterality: Bilateral;   ESOPHAGOGASTRODUODENOSCOPY (EGD) WITH PROPOFOL N/A 03/11/2018   Procedure: ESOPHAGOGASTRODUODENOSCOPY (EGD) WITH PROPOFOL;  Surgeon: Clarene Essex, MD;  Location: [place];  Service: Endoscopy;  Laterality: N/A;   LAPAROSCOPIC CHOLECYSTECTOMY  01/14/2001   PROCTOSCOPY N/A 06/12/2021   Procedure: RIGID PROCTOSCOPY;  Surgeon: [name], MD;  [facility]: [facility];  Service: General;  Laterality: N/A;   RIGHT/LEFT HEART CATH AND CORONARY ANGIOGRAPHY N/A 06/12/2017   Procedure: RIGHT/LEFT HEART CATH AND CORONARY ANGIOGRAPHY;  Surgeon: Larey Dresser, MD;  Location: [place] CV LAB;  Service: Cardiovascular;  Laterality: N/A;   TEE WITHOUT CARDIOVERSION N/A 07/02/2017   Procedure: TRANSESOPHAGEAL ECHOCARDIOGRAM (TEE);  Surgeon: Prescott Gum, Collier Salina, MD;  Location: [place];  Service: Open Heart Surgery;  Laterality: N/A;   TEE WITHOUT CARDIOVERSION N/A 11/01/2021   Procedure: TRANSESOPHAGEAL ECHOCARDIOGRAM (TEE);  Surgeon: Geralynn Rile, MD;  Location: [place];  Service: Cardiovascular;  Laterality: N/A;   UMBILICAL HERNIA REPAIR  01/14/2008    Current Medications: Prior to Admission medications   Medication Sig Start Date End Date Taking? Authorizing Provider  acetaminophen (TYLENOL) 500 MG tablet Take 500 mg by mouth every 6 (six) hours as needed for moderate pain.    [provider]  albuterol (VENTOLIN HFA) 108 (90 Base) MCG/ACT inhaler INHALE 2 PUFFS INTO LUNGS EVERY 6 HOURS AS NEEDED FOR WHEEZING OR SHORTNESS OF BREATH 01/19/17   Baird Lyons D, MD  amiodarone (PACERONE) 200 MG tablet Take 1 tablet (200 mg total) by mouth daily. 07/29/17   Leanor Kail, PA  aspirin 81 MG EC tablet Take 1 tablet (81 mg total) by mouth daily. 07/13/17   Gold, Wilder Glade, PA-C  azelastine (ASTELIN) 137 MCG/SPRAY nasal spray Place 1-2 sprays into the nose 2 (two)  times daily as needed for rhinitis or allergies.     [provider]  benzonatate (TESSALON) 200 MG capsule Take 1 capsule (200 mg total) by mouth every 6 (six) hours as needed for cough. 11/19/16   Baird Lyons D, MD  budesonide-formoterol (SYMBICORT) 160-4.5 MCG/ACT inhaler INHALE 2 PUFF INTO THE LUNGS 2 TIMES A DAY. 01/19/17   Baird Lyons D, MD  Cholecalciferol (VITAMIN D-3) 5000 UNITS TABS Take 5,000 Units by mouth daily with breakfast.     [provider]  COLCRYS 0.6 MG tablet Take 0.6 mg by mouth daily. With breakfast 01/29/12   [provider]  Cyanocobalamin (VITAMIN B12 SL) Place 1,200 mcg under the tongue daily. 1 dropper    [provider]  dextromethorphan (DELSYM) 30 MG/5ML liquid Take 30 mg by mouth at bedtime as needed for cough.     [provider]  DHEA 50 MG TABS Take 50 mg by mouth daily with breakfast.     [provider]  fexofenadine (ALLEGRA) 180 MG tablet Take 180 mg by mouth daily with breakfast.     [provider]  finasteride (PROSCAR) 5 MG tablet Take 5 mg by mouth at bedtime.  01/14/14   [provider]  Hypromellose Vertell Limber ALLERGY NASAL SPRAY NA) Place 1 spray into both nostrils daily as needed (for allergies).     [provider]  levothyroxine (SYNTHROID, LEVOTHROID) 112 MCG tablet Take 112 mcg by mouth daily before breakfast.     [provider]  liothyronine (CYTOMEL) 25 MCG tablet Take 25 mcg by mouth every morning.     [provider]  magic mouthwash w/lidocaine SOLN Take 10 mLs by mouth 4 (four) times daily as needed for mouth pain. 07/16/17   Deneise Lever, MD  Menthol, Topical Analgesic, (BLUE-EMU MAXIMUM STRENGTH EX) Apply 1 application topically 4 (four) times daily as needed (for arthritis pain.).    [provider]  metoprolol tartrate (LOPRESSOR) 50 MG tablet Take 1 tablet (50 mg total) by mouth 2 (two) times daily. 07/29/17   Bhagat,  Bhavinkumar, PA  milk thistle 175 MG tablet Take 175 mg by mouth daily.    [provider]  Multiple Vitamins-Minerals (ONE-A-DAY MENS HEALTH FORMULA PO) Take 1 tablet by mouth daily with breakfast.     [provider]  omeprazole (PRILOSEC) 40 MG capsule Take 1 capsule (40 mg total) by mouth 2 (two) times daily. 10/31/16   Josue Hector, MD  perphenazine (TRILAFON) 2 MG tablet Take 2 mg by mouth 2 (two) times daily as needed (for migraines).     [provider]  Probiotic Product (ALIGN) 4 MG CAPS Take 4 mg by mouth daily with breakfast.     [provider]  tamsulosin (FLOMAX) 0.4 MG CAPS capsule Take 0.4 mg by mouth daily after supper.    [provider]  testosterone cypionate (DEPOTESTOTERONE CYPIONATE) 100 MG/ML injection Inject 200 mg into the muscle every Thursday. For IM use only     [provider]  tetrahydrozoline-zinc (VISINE-AC) 0.05-0.25 % ophthalmic solution Place 1 drop into both eyes as needed (dry eyes).    [provider]  venlafaxine (EFFEXOR-XR) 150 MG 24 hr capsule Take 150 mg by mouth every morning.     [provider]  warfarin (COUMADIN) 2.5 MG tablet Take 1 tablet (2.5 mg total) by mouth daily at 6 PM. Take 7.5mg  (3 tablet tonight) and 5mg  tomorrow and INR check Friday Patient taking differently: Take 2.5 mg by mouth daily at 6 PM. Take 7.5mg  (3 tablet tonight) and 5mg  tomorrow and INR check Friday and as directed 07/29/17   Leanor Kail, PA    Allergies:   Perphenazine, Amiodarone, Codeine, and Tramadol   Social History   Socioeconomic History   Marital status: Married    Spouse name: Not on file   Number of children: Not on file   Years of education: Not on file   Highest education level: Not on file  Occupational History   Occupation: disabled    Comment: owner of buisness  Tobacco Use   Smoking status: Former    Packs/day: 0.50    Years: 27.00    Pack years: 13.50    Types:  Cigarettes    Start date: 1970    Quit date: 10/21/1979    Years since quitting: 42.1   Smokeless tobacco: Never  Vaping Use   Vaping Use: Never used  Substance and Sexual Activity   Alcohol  use: Yes    Alcohol/week: 4.0 standard drinks    Types: 4 Cans of beer per week    Comment: drinks "as much as I could" - 2-3 beers up to 8-10   Drug use: Never   Sexual activity: Not on file  Other Topics Concern   Not on file  Social History Narrative   Right handed   One story home   Drinks no caffeine   Social Determinants of Health   Financial Resource Strain: Unknown   Difficulty of Paying Living Expenses: Patient refused  Food Insecurity: No Food Insecurity   Worried About Running Out of Food in the Last Year: Never true   Ran Out of Food in the Last Year: Never true  Transportation Needs: No Transportation Needs   Lack of Transportation (Medical): No   Lack of Transportation (Non-Medical): No  Physical Activity: Unknown   Days of Exercise per Week: 4 days   Minutes of Exercise per Session: Not on file  Stress: Unknown   Feeling of Stress : Patient refused  Social Connections: Unknown   Frequency of Communication with Friends and Family: More than three times a week   Frequency of Social Gatherings with Friends and Family: Once a week   Attends Religious Services: Patient refused   Marine scientist or Organizations: No   Attends Music therapist: Not on file   Marital Status: Married     Family History:  The patient's family history includes Aortic aneurysm (age of onset: 14) in his mother; Heart disease in an other family member; Other in his father.   ROS:   Please see the history of present illness.    ROS All other systems reviewed and are negative.   PHYSICAL EXAM:   VS:  BP 120/72    Pulse 65    Ht 5\' 10"  (1.778 m)    Wt 234 lb (106.1 kg)    SpO2 97%    BMI 33.58 kg/m     Affect appropriate Obese white male  HEENT: normal Neck supple with no  adenopathy JVP normal no bruits no thyromegaly Lungs clear with no wheezing and good diaphragmatic motion Heart:  S1/S2 SEM no AR  murmur, no rub, gallop or click PMI normal post sternotomy  Abdomen: benighn, BS positve, no tenderness, no AAA no bruit.  No HSM or HJR Distal pulses intact with no bruits No edema Neuro non-focal Skin warm and dry No muscular weakness PIC line LUE with no edema or signs of phlebitis     Wt Readings from Last 3 Encounters:  12/06/21 234 lb (106.1 kg)  11/28/21 225 lb (102.1 kg)  11/20/21 235 lb 3.2 oz (106.7 kg)      Studies/Labs Reviewed:   EKG:   02/16/19 SR LAD nonspecific ST changes 12/06/2021 NSR rate 77 sinus arrhythmia stable   Recent Labs: 10/27/2021: ALT 21 10/30/2021: Magnesium 1.6 11/05/2021: BUN 13; Creatinine, Ser 0.96; Hemoglobin 15.1; Platelets 276; Potassium 4.3; Sodium 138   Lipid Panel    Component Value Date/Time   CHOL 185 02/17/2019 0714   CHOL 184 05/05/2017 0931   TRIG 234 (H) 02/17/2019 0714   HDL 42 02/17/2019 0714   HDL 45 05/05/2017 0931   CHOLHDL 4.4 02/17/2019 0714   VLDL 47 (H) 02/17/2019 0714   LDLCALC 96 02/17/2019 0714   LDLCALC 94 05/05/2017 0931    Additional studies/ records that were reviewed today include:  As above   ASSESSMENT & PLAN:  1. PAF - on xarelto continue beta blocker off amiodarone now Post [facility] by Dr Quentin Ore 11/28/21 Maintaining NSR   2. HTN - Well controlled.  Continue current medications and low sodium Dash type diet.     3.  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 Prescott Gum 07/02/17. Post op echo with stable gradients no AR  -TEE 11/01/21 EF 60-65% 25 mm Edwards magna ease no AR And normal gradients mean 17 peak 29 mmhg Cardiac CTA with no abscess or SBE   4. OSA -Compliant with CPAP.  5. 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 F/U allergist cannot take oral steroids due to MS changes   6. Global Amnesia:   F/u neurology MRI/EEG ok ? Movement disorder improving etiology not clear   7. Bacteremia:  post 6 weeks antibiotics via PIC line no evidence of SBE on TEE/cardiac CTA f/u with ID at end of month to d/c [facility] line   Time:  spent reviewing hospitalization, cardiac CTA, TEE, Echo notes from Dr Quentin Ore and ID direct patient interview and composing note 60 minutes     F/U in 6 months      [name]

## 2021-12-04 DIAGNOSIS — R7881 Bacteremia: Secondary | ICD-10-CM | POA: Diagnosis not present

## 2021-12-05 DIAGNOSIS — A409 Streptococcal sepsis, unspecified: Secondary | ICD-10-CM | POA: Diagnosis not present

## 2021-12-05 DIAGNOSIS — R7881 Bacteremia: Secondary | ICD-10-CM | POA: Diagnosis not present

## 2021-12-05 DIAGNOSIS — Z9049 Acquired absence of other specified parts of digestive tract: Secondary | ICD-10-CM | POA: Diagnosis not present

## 2021-12-06 ENCOUNTER — Other Ambulatory Visit: Payer: Self-pay

## 2021-12-06 ENCOUNTER — Ambulatory Visit: Payer: Medicare Other | Admitting: Cardiovascular Disease

## 2021-12-06 ENCOUNTER — Encounter: Payer: Self-pay | Admitting: Cardiovascular Disease

## 2021-12-06 VITALS — BP 120/72 | HR 65 | Ht 70.0 in | Wt 234.0 lb

## 2021-12-06 DIAGNOSIS — I483 Typical atrial flutter: Secondary | ICD-10-CM

## 2021-12-06 DIAGNOSIS — I10 Essential (primary) hypertension: Secondary | ICD-10-CM | POA: Diagnosis not present

## 2021-12-06 DIAGNOSIS — B955 Unspecified streptococcus as the cause of diseases classified elsewhere: Secondary | ICD-10-CM

## 2021-12-06 DIAGNOSIS — Z7901 Long term (current) use of anticoagulants: Secondary | ICD-10-CM | POA: Diagnosis not present

## 2021-12-06 DIAGNOSIS — Z952 Presence of prosthetic heart valve: Secondary | ICD-10-CM | POA: Diagnosis not present

## 2021-12-06 DIAGNOSIS — R7881 Bacteremia: Secondary | ICD-10-CM

## 2021-12-06 NOTE — Patient Instructions (Signed)
Medication Instructions:  °Your physician recommends that you continue on your current medications as directed. Please refer to the Current Medication list given to you today. ° °*If you need a refill on your cardiac medications before your next appointment, please call your pharmacy* ° °Lab Work: °If you have labs (blood work) drawn today and your tests are completely normal, you will receive your results only by: °MyChart Message (if you have MyChart) OR °A paper copy in the mail °If you have any lab test that is abnormal or we need to change your treatment, we will call you to review the results. ° °Testing/Procedures: °None ordered today. ° °Follow-Up: °At [facility], you and your health needs are our priority.  As part of our continuing mission to provide you with exceptional heart care, we have created designated Provider Care Teams.  These Care Teams include your primary Cardiologist (physician) and Advanced Practice Providers (APPs -  Physician Assistants and Nurse Practitioners) who all work together to provide you with the care you need, when you need it. ° °We recommend signing up for the patient portal called "MyChart".  Sign up information is provided on this After Visit Summary.  MyChart is used to connect with patients for Virtual Visits (Telemedicine).  Patients are able to view lab/test results, encounter notes, upcoming appointments, etc.  Non-urgent messages can be sent to your provider as well.   °To learn more about what you can do with MyChart, go to [email or web address].   ° °Your next appointment:   °6 month(s) ° °The format for your next appointment:   °In Person ° °Provider:   °Peter Nishan, MD { ° ° °

## 2021-12-07 DIAGNOSIS — R7881 Bacteremia: Secondary | ICD-10-CM | POA: Diagnosis not present

## 2021-12-08 DIAGNOSIS — R7881 Bacteremia: Secondary | ICD-10-CM | POA: Diagnosis not present

## 2021-12-09 DIAGNOSIS — U071 COVID-19: Secondary | ICD-10-CM | POA: Diagnosis not present

## 2021-12-09 DIAGNOSIS — J45909 Unspecified asthma, uncomplicated: Secondary | ICD-10-CM | POA: Diagnosis not present

## 2021-12-09 DIAGNOSIS — K219 Gastro-esophageal reflux disease without esophagitis: Secondary | ICD-10-CM | POA: Diagnosis not present

## 2021-12-09 DIAGNOSIS — N400 Enlarged prostate without lower urinary tract symptoms: Secondary | ICD-10-CM | POA: Diagnosis not present

## 2021-12-09 DIAGNOSIS — R7881 Bacteremia: Secondary | ICD-10-CM | POA: Diagnosis not present

## 2021-12-09 DIAGNOSIS — E039 Hypothyroidism, unspecified: Secondary | ICD-10-CM | POA: Diagnosis not present

## 2021-12-09 DIAGNOSIS — G4733 Obstructive sleep apnea (adult) (pediatric): Secondary | ICD-10-CM | POA: Diagnosis not present

## 2021-12-09 DIAGNOSIS — A409 Streptococcal sepsis, unspecified: Secondary | ICD-10-CM | POA: Diagnosis not present

## 2021-12-09 DIAGNOSIS — I480 Paroxysmal atrial fibrillation: Secondary | ICD-10-CM | POA: Diagnosis not present

## 2021-12-09 DIAGNOSIS — F419 Anxiety disorder, unspecified: Secondary | ICD-10-CM | POA: Diagnosis not present

## 2021-12-09 DIAGNOSIS — I10 Essential (primary) hypertension: Secondary | ICD-10-CM | POA: Diagnosis not present

## 2021-12-09 DIAGNOSIS — I959 Hypotension, unspecified: Secondary | ICD-10-CM | POA: Diagnosis not present

## 2021-12-09 DIAGNOSIS — M797 Fibromyalgia: Secondary | ICD-10-CM | POA: Diagnosis not present

## 2021-12-09 DIAGNOSIS — N179 Acute kidney failure, unspecified: Secondary | ICD-10-CM | POA: Diagnosis not present

## 2021-12-09 DIAGNOSIS — G43909 Migraine, unspecified, not intractable, without status migrainosus: Secondary | ICD-10-CM | POA: Diagnosis not present

## 2021-12-09 DIAGNOSIS — F32A Depression, unspecified: Secondary | ICD-10-CM | POA: Diagnosis not present

## 2021-12-09 DIAGNOSIS — M1990 Unspecified osteoarthritis, unspecified site: Secondary | ICD-10-CM | POA: Diagnosis not present

## 2021-12-10 DIAGNOSIS — R7881 Bacteremia: Secondary | ICD-10-CM | POA: Diagnosis not present

## 2021-12-11 ENCOUNTER — Other Ambulatory Visit: Payer: Self-pay | Admitting: Neurology

## 2021-12-11 ENCOUNTER — Encounter (HOSPITAL_COMMUNITY): Payer: Self-pay

## 2021-12-11 ENCOUNTER — Ambulatory Visit (HOSPITAL_COMMUNITY): Admit: 2021-12-11 | Payer: Medicare Other | Admitting: Cardiovascular Disease

## 2021-12-11 DIAGNOSIS — R7881 Bacteremia: Secondary | ICD-10-CM | POA: Diagnosis not present

## 2021-12-11 SURGERY — CARDIOVERSION
Anesthesia: Monitor Anesthesia Care

## 2021-12-11 MED ORDER — AIMOVIG 140 MG/ML ~~LOC~~ SOAJ: 1.0000 | SUBCUTANEOUS | 11 refills | Status: DC

## 2021-12-12 DIAGNOSIS — R7881 Bacteremia: Secondary | ICD-10-CM | POA: Diagnosis not present

## 2021-12-12 DIAGNOSIS — A409 Streptococcal sepsis, unspecified: Secondary | ICD-10-CM | POA: Diagnosis not present

## 2021-12-13 DIAGNOSIS — R7881 Bacteremia: Secondary | ICD-10-CM | POA: Diagnosis not present

## 2021-12-14 DIAGNOSIS — R7881 Bacteremia: Secondary | ICD-10-CM | POA: Diagnosis not present

## 2021-12-15 ENCOUNTER — Encounter: Payer: Self-pay | Admitting: Cardiovascular Disease

## 2021-12-15 DIAGNOSIS — R7881 Bacteremia: Secondary | ICD-10-CM | POA: Diagnosis not present

## 2021-12-16 DIAGNOSIS — R7881 Bacteremia: Secondary | ICD-10-CM | POA: Diagnosis not present

## 2021-12-16 DIAGNOSIS — A409 Streptococcal sepsis, unspecified: Secondary | ICD-10-CM | POA: Diagnosis not present

## 2021-12-17 ENCOUNTER — Other Ambulatory Visit: Payer: Self-pay

## 2021-12-17 ENCOUNTER — Ambulatory Visit (INDEPENDENT_AMBULATORY_CARE_PROVIDER_SITE_OTHER): Payer: Medicare Other

## 2021-12-17 VITALS — BP 146/79 | HR 60 | Temp 97.9°F | Resp 18 | Ht 70.0 in | Wt 240.6 lb

## 2021-12-17 DIAGNOSIS — Z4800 Encounter for change or removal of nonsurgical wound dressing: Secondary | ICD-10-CM

## 2021-12-17 DIAGNOSIS — I330 Acute and subacute infective endocarditis: Secondary | ICD-10-CM

## 2021-12-17 NOTE — Progress Notes (Addendum)
PICC line removed from left upper arm and pressure held for 15 minutes.  Tip intact - removed length 50 matching insertion length. Pressure dressing applied and patient observed for 30 minutes.  Instructions reviewed with patient and spouse

## 2022-01-03 ENCOUNTER — Encounter: Payer: Self-pay | Admitting: Internal Medicine

## 2022-01-03 ENCOUNTER — Ambulatory Visit: Payer: Medicare Other | Admitting: Internal Medicine

## 2022-01-03 ENCOUNTER — Other Ambulatory Visit: Payer: Self-pay

## 2022-01-03 VITALS — BP 145/80 | HR 65 | Temp 97.9°F | Wt 239.8 lb

## 2022-01-03 DIAGNOSIS — I339 Acute and subacute endocarditis, unspecified: Secondary | ICD-10-CM | POA: Diagnosis not present

## 2022-01-03 NOTE — Progress Notes (Signed)
? ?   ? ? ? ? ?Patient Active Problem List  ? Diagnosis Date Noted  ? Acute infective endocarditis 11/09/2021  ? Medication monitoring encounter 11/09/2021  ? COVID-19   ? AKI (acute kidney injury) ([place]) 10/27/2021  ? Streptococcal bacteremia 10/27/2021  ? Hypomagnesemia 06/13/2021  ? Hypokalemia 06/13/2021  ? Colonic diverticular abscess 06/13/2021  ? Stricture of sigmoid colon ([place]) 06/12/2021  ? Anxiety 03/19/2021  ? Diverticulitis of colon 03/19/2021  ? Gout 03/19/2021  ? Hardening of the aorta (main artery of the heart) ([place]) 03/19/2021  ? Hypercoagulable state ([place]) 03/19/2021  ? Iron deficiency anemia 03/19/2021  ? Migraine 03/19/2021  ? Mixed hyperlipidemia 03/19/2021  ? Personal history of colonic polyps 03/19/2021  ? Recurrent major depression in remission ([place]) 03/19/2021  ? 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 Acute diverticulitis 10/10/2020  ? Bilateral shoulder pain 10/26/2019  ? Morbid obesity due to excess calories ([place]) complicated by HBP, OSA/ gerd  07/07/2019  ? Upper airway cough syndrome 03/09/2019  ? Confusion with non-focal neuro exam 02/16/2019  ? Cough variant asthma vs UACS 11/15/2018  ? Cough 10/27/2018  ? Angioedema 03/07/2018  ? Sepsis ([place]) 03/07/2018  ? Asthma 03/07/2018  ? Hypothyroidism 03/07/2018  ? BPH (benign prostatic hyperplasia) 03/07/2018  ? Pain in joint of left shoulder 01/26/2018  ? Allergic reaction 01/21/2018  ? Atrial fibrillation with RVR ([place]) 07/28/2017  ? S/P AVR (aortic valve replacement) and aortoplasty 07/02/2017  ? Eczema 01/01/2014  ? Hoarseness or changing voice 07/24/2013  ? PAF (paroxysmal atrial fibrillation) ([place]) 05/18/2013  ? Chronic anticoagulation 05/18/2013  ? Preop cardiovascular exam 05/18/2013  ? Macular rash 08/10/2012  ? Acute asthmatic bronchitis 05/05/2012  ? Essential hypertension 06/17/2010  ? SYNCOPE 06/14/2010  ? DEPRESSION 02/11/2008  ? Obstructive sleep apnea 02/11/2008  ? Seasonal and perennial allergic rhinitis 02/11/2008  ? Chronic  bronchitis ([place]) 02/11/2008  ? GERD (gastroesophageal reflux disease) 02/11/2008  ? ? ?Patient's Medications  ?New Prescriptions  ? No medications on file  ?Previous Medications  ? ALBUTEROL (VENTOLIN HFA) 108 (90 BASE) MCG/ACT INHALER    INHALE 2 PUFFS INTO THE LUNGS EVERY 6 HOURS AS NEEDED FOR WHEEZING OR SHORTNESS OF BREATH  ? ALPRAZOLAM (XANAX) 0.25 MG TABLET    Take 1 tablet (0.25 mg total) by mouth 3 (three) times daily as needed for anxiety.  ? ALUMINUM HYDROXIDE-MAGNESIUM CARBONATE (GAVISCON) 95-358 MG/15ML SUSP    Take 15 mLs by mouth 3 (three) times daily as needed for indigestion or heartburn.  ? AZELASTINE (ASTELIN) 137 MCG/SPRAY NASAL SPRAY    Place 1-2 sprays into the nose 2 (two) times daily as needed for rhinitis or allergies.   ? BENZONATATE (TESSALON) 200 MG CAPSULE    Take 1 capsule (200 mg total) by mouth 3 (three) times daily as needed for cough.  ? BUDESONIDE-FORMOTEROL (SYMBICORT) 160-4.5 MCG/ACT INHALER    Inhale 2 puffs into the lungs in the morning and at bedtime.  ? CALCIUM CARBONATE (TUMS - DOSED IN MG ELEMENTAL CALCIUM) 500 MG CHEWABLE TABLET    Chew 1-2 mg by mouth 3 (three) times daily as needed for indigestion or heartburn.  ? CLOBETASOL PROP EMOLLIENT BASE 0.05 % EMOLLIENT CREAM    Apply 1 application topically daily as needed (skin irritation/rash.).  ? COLCRYS 0.6 MG TABLET    Take 0.6 mg by mouth in the morning.  ? DEXTROMETHORPHAN HBR (DELSYM PO)    Take by mouth.  ? DICLOFENAC SODIUM (VOLTAREN) 1 % GEL  Apply 2 g topically 3 (three) times daily as needed (arthritis pain.).  ? DIPHENHYDRAMINE (BENADRYL) 25 MG CAPSULE    Take 50 mg by mouth daily as needed (allergies.).  ? EPINEPHRINE 0.3 MG/0.3 ML IJ SOAJ INJECTION    Inject 0.3 mLs (0.3 mg total) into the muscle as needed (for angioedema).  ? ERENUMAB-AOOE (AIMOVIG) 140 MG/ML SOAJ    Inject 140 mg into the skin every 28 (twenty-eight) days.  ? FERROUS SULFATE (IRON) 142 (45 FE) MG TBCR    Take 45 mg by mouth in the morning and  at bedtime.  ? FINASTERIDE (PROSCAR) 5 MG TABLET    Take 5 mg by mouth at bedtime.   ? LEVOTHYROXINE (SYNTHROID, LEVOTHROID) 112 MCG TABLET    Take 112 mcg by mouth daily before breakfast.   ? LIOTHYRONINE (CYTOMEL) 25 MCG TABLET    Take 25 mcg by mouth daily before breakfast.  ? LORAZEPAM (ATIVAN) 1 MG TABLET    Take 0.5-1 mg by mouth 2 (two) times daily as needed for anxiety.  ? LOSARTAN (COZAAR) 50 MG TABLET    TAKE 1 TABLET BY MOUTH EVERY DAY  ? MENTHOL, TOPICAL ANALGESIC, (BLUE-EMU MAXIMUM STRENGTH EX)    Apply 1 application topically 3 (three) times daily as needed (arthritis pain.).  ? METHYLCELLULOSE, LAXATIVE, (FIBER THERAPY) 500 MG TABS    Take 1,000 mg by mouth in the morning.  ? METOPROLOL TARTRATE (LOPRESSOR) 50 MG TABLET    Take 1 tablet (50 mg total) by mouth 2 (two) times daily.  ? MULTIPLE VITAMIN (MULTIVITAMIN WITH MINERALS) TABS TABLET    Take 1 tablet by mouth in the morning.  ? NON FORMULARY    Pt uses a cpap nightly  ? OLOPATADINE (PATANOL) 0.1 % OPHTHALMIC SOLUTION    Place 1 drop into both eyes 2 (two) times daily as needed for allergies.  ? OMEPRAZOLE (PRILOSEC) 40 MG CAPSULE    Take 1 capsule (40 mg total) by mouth 2 (two) times daily.  ? PSEUDOEPHEDRINE-GUAIFENESIN (MUCINEX D PO)    Take by mouth.  ? RIVAROXABAN (XARELTO) 20 MG TABS TABLET    Take 1 tablet (20 mg total) by mouth daily with supper.  ? RIZATRIPTAN (MAXALT) 10 MG TABLET    Take 1 tablet (10 mg total) by mouth as needed for migraine. May repeat in 2 hours if needed  ? SACCHAROMYCES BOULARDII (FLORASTOR) 250 MG CAPSULE    Take 250 mg by mouth daily as needed (when taking antibiotics).  ? SODIUM CHLORIDE (OCEAN) 0.65 % SOLN NASAL SPRAY    Place 1 spray into both nostrils as needed for congestion.  ? TAMSULOSIN (FLOMAX) 0.4 MG CAPS CAPSULE    Take 0.4 mg by mouth in the morning and at bedtime.  ? TESTOSTERONE CYPIONATE 200 MG/ML SOLN    Inject 100 mg into the muscle every Friday.  ? VENLAFAXINE (EFFEXOR-XR) 150 MG 24 HR CAPSULE     Take 150 mg by mouth daily with breakfast.  ? ZOLPIDEM (AMBIEN) 10 MG TABLET    Take 10 mg by mouth at bedtime as needed for sleep.  ?Modified Medications  ? No medications on file  ?Discontinued Medications  ? No medications on file  ? ? ?Subjective: ?Grant [name] is a 71 y.o. 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 HE presents for concerns of Afib 2/2 PICC line. He had  a bleeding PICC line. Seen by my  partner Dr. Alphonsa Gin and placed on amoxicillin while awaiting line placement. He had a new PICC placed on 11/13/21. He developed a flutter. Seen by Dr. Copper, Cardiology. CXR done and noted appropriate position of PICC and reassured that PICC line is not related to afib/aflutter. ?11/26/21: He and wife had similar concerns with PICC leading to aflutter. Discussed that it was unlikely PICC related.  ?Interval: SP cardioversion. Completed antibiotics on 12/16/21 and PICC pulled.  ?Today: Pt presents with wife. Overall feels well. Has had a long standing cough.  ?Review of Systems: ?Review of Systems  ?All other systems reviewed and are negative. ? ?Past Medical History:  ?Diagnosis Date  ? Allergic rhinitis   ? Anxiety   ? Arthritis   ? Asthma   ? Bladder tumor   ? Chronic fatigue   ? COVID-19 05/22/2021  ? Depression   ? 06/30/17 Pt denies being depressed, reports Effexor is taken for Chronic Fatigue   ? Dyspnea   ? Enlarged prostate   ? Fibromyalgia   ? GERD (gastroesophageal reflux disease)   ? Headache   ? History of chronic bronchitis   ? History of migraine   ? History of toxic encephalopathy   ? Hypothyroidism   ? OSA on CPAP   ? CPAP 14  ? PAF (paroxysmal atrial fibrillation) ([place]) CARDIOLOGIST -- DR [name]  ? DX OCT 2013  ? S/P AVR (aortic valve replacement) and aortoplasty   ? Sensitiveness to light   ? Unspecified essential hypertension   ? Urethral tumor   ? PROSTATIC  ? ? ?Social History  ? ?Tobacco Use  ?  Smoking status: Former  ?  Packs/day: 0.50  ?  Years: 27.00  ?  Pack years: 13.50  ?  Types: Cigarettes  ?  Start date: 1970  ?  Quit date: 10/21/1979  ?  Years since quitting: 42.2  ? Smokeless tobacc

## 2022-01-06 ENCOUNTER — Ambulatory Visit: Payer: Medicare Other | Admitting: Internal Medicine

## 2022-01-06 ENCOUNTER — Ambulatory Visit: Payer: Medicare Other | Admitting: Neurology

## 2022-01-06 DIAGNOSIS — G4733 Obstructive sleep apnea (adult) (pediatric): Secondary | ICD-10-CM | POA: Diagnosis not present

## 2022-01-09 LAB — CULTURE, BLOOD (SINGLE)
MICRO NUMBER:: 13150317
MICRO NUMBER:: 13150318
Result:: NO GROWTH
Result:: NO GROWTH
SPECIMEN QUALITY:: ADEQUATE
SPECIMEN QUALITY:: ADEQUATE

## 2022-01-09 LAB — COMPLETE METABOLIC PANEL WITH GFR
AG Ratio: 2 (calc) (ref 1.0–2.5)
ALT: 17 U/L (ref 9–46)
AST: 20 U/L (ref 10–35)
Albumin: 4.1 g/dL (ref 3.6–5.1)
Alkaline phosphatase (APISO): 59 U/L (ref 35–144)
BUN: 12 mg/dL (ref 7–25)
CO2: 26 mmol/L (ref 20–32)
Calcium: 9.3 mg/dL (ref 8.6–10.3)
Chloride: 102 mmol/L (ref 98–110)
Creat: 0.92 mg/dL (ref 0.70–1.35)
Globulin: 2.1 g/dL (calc) (ref 1.9–3.7)
Glucose, Bld: 104 mg/dL — ABNORMAL HIGH (ref 65–99)
Potassium: 4.4 mmol/L (ref 3.5–5.3)
Sodium: 140 mmol/L (ref 135–146)
Total Bilirubin: 0.6 mg/dL (ref 0.2–1.2)
Total Protein: 6.2 g/dL (ref 6.1–8.1)
eGFR: 91 mL/min/{1.73_m2} (ref >=60)

## 2022-01-09 LAB — CBC WITH DIFFERENTIAL/PLATELET
Absolute Monocytes: 862 cells/uL (ref 200–950)
Basophils Absolute: 54 cells/uL (ref 0–200)
Basophils Relative: 0.7 %
Eosinophils Absolute: 185 cells/uL (ref 15–500)
Eosinophils Relative: 2.4 %
HCT: 47.9 % (ref 38.5–50.0)
Hemoglobin: 16 g/dL (ref 13.2–17.1)
Lymphs Abs: 1178 cells/uL (ref 850–3900)
MCH: 30.9 pg (ref 27.0–33.0)
MCHC: 33.4 g/dL (ref 32.0–36.0)
MCV: 92.6 fL (ref 80.0–100.0)
MPV: 10.2 fL (ref 7.5–12.5)
Monocytes Relative: 11.2 %
Neutro Abs: 5421 cells/uL (ref 1500–7800)
Neutrophils Relative %: 70.4 %
Platelets: 172 10*3/uL (ref 140–400)
RBC: 5.17 10*6/uL (ref 4.20–5.80)
RDW: 12.8 % (ref 11.0–15.0)
Total Lymphocyte: 15.3 %
WBC: 7.7 10*3/uL (ref 3.8–10.8)

## 2022-01-09 LAB — SEDIMENTATION RATE: Sed Rate: 2 mm/h (ref 0–20)

## 2022-01-09 LAB — C-REACTIVE PROTEIN: CRP: 1 mg/L (ref <8.0)

## 2022-01-14 ENCOUNTER — Encounter: Payer: Self-pay | Admitting: Neurology

## 2022-01-14 ENCOUNTER — Other Ambulatory Visit (HOSPITAL_COMMUNITY): Payer: Self-pay

## 2022-01-14 ENCOUNTER — Telehealth: Payer: Self-pay | Admitting: Pharmacy Technician

## 2022-01-14 NOTE — Telephone Encounter (Signed)
Patient's wife Tye [place] called to follow up on this information and to be sure Dr. Delice Lesch can open the links. ?

## 2022-01-14 NOTE — Telephone Encounter (Signed)
Patient Advocate Encounter ? ?Received notification from [employer] that prior authorization for AIMOVIG '140MG'$  is required. ?  ?PA submitted on 3.28.23 ?Key [id number] ?Status is pending ?  ?[facility] will continue to follow ? ?Long Brimage R Cheyene Hamric, CPhT ?Patient Advocate ?Phone: [phone] ?Fax:  [phone] ? ?

## 2022-01-15 ENCOUNTER — Other Ambulatory Visit: Payer: Self-pay

## 2022-01-15 ENCOUNTER — Encounter: Payer: Self-pay | Admitting: Neurology

## 2022-01-15 ENCOUNTER — Telehealth: Payer: Medicare Other | Admitting: Neurology

## 2022-01-15 VITALS — Ht 70.0 in | Wt 235.0 lb

## 2022-01-15 DIAGNOSIS — R259 Unspecified abnormal involuntary movements: Secondary | ICD-10-CM

## 2022-01-15 DIAGNOSIS — G43109 Migraine with aura, not intractable, without status migrainosus: Secondary | ICD-10-CM

## 2022-01-15 MED ORDER — RIZATRIPTAN BENZOATE 10 MG PO TABS: 10.0000 mg | ORAL_TABLET | ORAL | 3 refills | Status: DC | PRN

## 2022-01-15 MED ORDER — AIMOVIG 140 MG/ML ~~LOC~~ SOAJ
1.0000 | SUBCUTANEOUS | 11 refills | Status: DC
Start: 2022-01-15 — End: 2022-12-19

## 2022-01-15 NOTE — Progress Notes (Signed)
? ?Virtual Visit via Video Note ?The purpose of this virtual visit is to provide medical care while limiting exposure to the novel coronavirus.   ? ?Consent was obtained for video visit:  Yes.   ?Answered questions that patient had about telehealth interaction:  Yes.   ?I discussed the limitations, risks, security and privacy concerns of performing an evaluation and management service by telemedicine. I also discussed with the patient that there may be a patient responsible charge related to this service. The patient expressed understanding and agreed to proceed. ? ?Pt location: Home ?Physician Location: office ?Name of referring provider:  Shirline Frees, MD ?I connected with Grant [name] at patients initiation/request on 01/15/2022 at  8:30 AM EDT by video enabled telemedicine application and verified that I am speaking with the correct person using two identifiers. ?Pt MRN:  [id number] ?Pt DOB:  Mar 13, 1951 ?Video Participants:  Grant [name];  Marie Chow (spouse) ? ? [name]:  ?The patient had a virtual video visit on 01/15/2022. He was last seen in the neurology clinic 6 months ago for migraines and new onset involuntary movements that started around 11/2020 where he would have jerking/twitching movements, more on the right. In the past, he would wave his right arm or constantly move his right leg. He had difficulty using his cellphone or the computer mouse because he could not control the hand movements. He had a normal brain MRI without contrast in 12/2020. Chorea workup was negative. These movements had quieted down, however he continues to have repeated mouth movements (chewing movements) and leg movements. They sent a video for review where he is seen using his cellphone and blowing his nose with the mouth movements/upper body rocking/side to side feet movements. Juliann Pulse reports he is able to talk during them, no unresponsiveness. She alerts him he is having the movements and they  stop. He states if he thinks about them, they stop. Then as he is back to his quiet activity, the movements start again. There is no associated headache. The migraines are not as intense when he gets them, he states he has not had as much of a problem with the migraines in the past 1-2 weeks. He is on Aimovig for migraine prevention and has prn Maxalt. We reviewed his medications, Juliann Pulse feels that the movements usually start after he takes his morning medications, which include Venlafaxine '150mg'$  tablet. They usually start around 3-4pm and seem somewhat better with regular exercise. He replaced the floor boards on their front deck yesterday and lay on the couch sore after, with no movements noted for several hours. He started walking a mile to the gym and exercising. He states mood is "usual, I'm okay." He states he is not depressed or anxious/nervous. Venlafaxine was started 10 years ago when he was dealing with a lot of chronic fatigue and medical issues, that are getting better now. He has bad allergies and takes Allegra, occasionally needs a Benadryl. He does not wear dentures. ? ? ?History on Initial Assessment 03/02/2019: This is a 71 year old right-handed man with a history of OSA, atrial fibrillation on chronic anticoagulation, aortic valve replacement, depression, chronic fatigue syndrome/fibromyalgia, presenting after hospital admission last 02/16/2019 for altered mental status. He was in his usual state of health ("a little unusual all the time anyway"), working in his shop when he stopped doing one thing and could not remember what he was going to do next. As this was going on, his wife came to  the shop and he does not remember much until they were riding in the car to the hospital. His wife reports that he has had baseline confusion with his chronic fatigue syndrome for more than 15 years, worse when he is tired, but this day was unusual because he was looking all over the place not knowing what he was  doing, and he kept asking her the same thing repeatedly. He would usually argue when he goes to the doctor, but this time did not argue, he was not as cognizant of how different/serious it was while he was in the hospital. His wife reported that he had been "bouncing off the edge of a migraine daily for 2-3 weeks prior." He was taking a lot of Tylenol, Aleve, and Maxalt. It appears he was back to baseline in the ER. Bloodwork was normal, UDS and EtOH level negative. I personally reviewed MRI/MRA brain which did not show any acute changes. There was mild chronic microvascular disease. There was scattered small vessel atherosclerotic change without significant stenosis. There was an incidental finding of a 59m focal outpouching at the left MCA bifurcation, suspicious for aneurysm. His wake and drowsy EEG was normal. Symptoms felt due to transient global amnesia or migraine. No further recurrence of similar significant confusion. ? ?They report a complicated history since his early [id number] has been diagnosed as chronic fatigue syndrome (CFS). He takes Effexor. His wife states it has been more than 15 years where he has been confused and kind of forgetful. 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 He would have days where he speaks and thinks better, figuring out things faster, usually when he has an adrenaline burst, then would crash for a couple of days. If he gets overstimulated or surprised by a loud sound or too much information, he would "start going somewhere else" and get confused. He got lost in a store one time. It can last 1-2 hours or a whole day before he is back to his usual self. 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 He has had migraines since childhood, usually with stabbing pain in the left temporal or retroorbital regions. If he does not stop  them, they may go on all day. He would have tunnel vision and see black shimmering objects, with nausea and light sensitivity, tunnel vision. His left eye would almost shut. He has been taking Maxalt every other day for the past few weeks. He has never been on a preventative medication. He took perphenazine in the past but it caused tongue swelling. He took it a week or so ago and his tongue swelled up again. They report balance issues for the past 1.5 years, he would have extreme fatigue where he feels like he would pass out, occasional vertical diplopia. He was in the hospital while visiting the beach in 08/2018. They fell since his cardiac surgery in 2018, "more funny things have been going on since then." He does not drive. His mother and maternal grandmother had migraines. He denies any focal numbness/tingling/weakness, dysphagia. Sleep is good with CPAP machine.  ? ?Diagnostic Data: ?MRI brain without contrast in 12/2020 did not show any acute changes, there was mild chronic microvascular disease in the right paracentral pons, stable chronic microhemorrhage in the left frontal lobe.  ?Wake and sleep EEG in 12/2020 was normal  ?Bloodwork  for CK, aldolase, ceruloplasmin, copper, PTH, ferritin, sedimentation rate, ANA, antiphospholipid antibody, lupus anticoagulant, RPR normal. ?  ?Current Outpatient Medications on File Prior to Visit  ?Medication Sig Dispense Refill  ? albuterol (VENTOLIN HFA) 108 (90 Base) MCG/ACT inhaler INHALE 2 PUFFS INTO THE LUNGS EVERY 6 HOURS AS NEEDED FOR WHEEZING OR SHORTNESS OF BREATH (Patient taking differently: 2 puffs every 6 (six) hours as needed for shortness of breath.) 18 g 5  ? ALPRAZolam (XANAX) 0.25 MG tablet Take 1 tablet (0.25 mg total) by mouth 3 (three) times daily as needed for anxiety. 15 tablet 0  ? aluminum hydroxide-magnesium carbonate (GAVISCON) 95-358 MG/15ML SUSP Take 15 mLs by mouth 3 (three) times daily as needed for indigestion or heartburn.    ? azelastine  (ASTELIN) 137 MCG/SPRAY nasal spray Place 1-2 sprays into the nose 2 (two) times daily as needed for rhinitis or allergies.     ? benzonatate (TESSALON) 200 MG capsule Take 1 capsule (200 mg total) by mouth

## 2022-01-15 NOTE — Patient Instructions (Signed)
Good to see you! ? ?Continue Aimovig and as needed Maxalt. ? ?2. Start taking the Venlafaxine at night. If no change in symptoms in a month, please update me and we will plan to reduce dose to '75mg'$  daily. ? ?3. Continue regular exercise ? ?4. Follow-up in  6 months, call for any changes ?

## 2022-01-20 NOTE — Telephone Encounter (Signed)
Prior Authorization not required was approved for 04/25/2021 until 04/25/2022.  Pharmacy needs to run medication for a 30 day supply not a 28 day supply. ? ?Grant [name], CPHT ?Pharmacy Patient Advocate Specialist ?[facility] Patient Advocate Team ?Direct Number: [phone]  Fax: [phone] ? ?  ?

## 2022-01-23 ENCOUNTER — Other Ambulatory Visit (HOSPITAL_COMMUNITY): Payer: Self-pay

## 2022-01-23 DIAGNOSIS — M25512 Pain in left shoulder: Secondary | ICD-10-CM | POA: Diagnosis not present

## 2022-01-23 DIAGNOSIS — M25511 Pain in right shoulder: Secondary | ICD-10-CM | POA: Diagnosis not present

## 2022-01-27 ENCOUNTER — Other Ambulatory Visit (HOSPITAL_COMMUNITY): Payer: Self-pay

## 2022-01-27 ENCOUNTER — Telehealth (HOSPITAL_COMMUNITY): Payer: Self-pay | Admitting: Pharmacy Technician

## 2022-01-27 ENCOUNTER — Telehealth: Payer: Self-pay | Admitting: Neurology

## 2022-01-27 NOTE — Telephone Encounter (Signed)
Patient Advocate Encounter ? ?Prior Authorization for Maxalt 10 mg has been approved.   ? ? ?Effective dates: 01/27/2022 through 01/28/2023 ? ? ? ? ? ?Lyndel Safe, CPhT ?Pharmacy Patient Advocate Specialist ?[facility] Patient Advocate Team ?Direct Number: [phone]  Fax: [phone]  ?

## 2022-01-27 NOTE — Telephone Encounter (Signed)
Patient Advocate Encounter ?  ?Received notification fromthat prior authorization for Maxalt 10 mg is required. ?  ?PA submitted on 01/27/2022 ?Key [id number] ?Status is pending ?   ? ? ? ?Lyndel Safe, CPhT ?Pharmacy Patient Advocate Specialist ?[facility] Patient Advocate Team ?Direct Number: [phone]  Fax: [phone]  ?

## 2022-01-27 NOTE — Telephone Encounter (Signed)
[employer] called and left a message. Non formulary exception prior auth request for Maxalt is approved 01/27/22-01/28/23. ?

## 2022-01-28 DIAGNOSIS — R0989 Other specified symptoms and signs involving the circulatory and respiratory systems: Secondary | ICD-10-CM | POA: Diagnosis not present

## 2022-01-28 DIAGNOSIS — I10 Essential (primary) hypertension: Secondary | ICD-10-CM | POA: Diagnosis not present

## 2022-01-28 DIAGNOSIS — R109 Unspecified abdominal pain: Secondary | ICD-10-CM | POA: Diagnosis not present

## 2022-01-30 ENCOUNTER — Encounter: Payer: Self-pay | Admitting: Internal Medicine

## 2022-02-03 ENCOUNTER — Encounter: Payer: Medicare Other | Admitting: Cardiothoracic Surgery

## 2022-02-04 ENCOUNTER — Other Ambulatory Visit: Payer: Self-pay | Admitting: Family Medicine

## 2022-02-04 ENCOUNTER — Ambulatory Visit
Admission: RE | Admit: 2022-02-04 | Discharge: 2022-02-04 | Disposition: A | Discharge status: Home / Self Care | Payer: Medicare Other | Source: Ambulatory Visit | Attending: Family Medicine | Admitting: Family Medicine

## 2022-02-04 DIAGNOSIS — R051 Acute cough: Secondary | ICD-10-CM

## 2022-02-04 DIAGNOSIS — R059 Cough, unspecified: Secondary | ICD-10-CM | POA: Diagnosis not present

## 2022-02-06 DIAGNOSIS — G4733 Obstructive sleep apnea (adult) (pediatric): Secondary | ICD-10-CM | POA: Diagnosis not present

## 2022-02-10 DIAGNOSIS — H5201 Hypermetropia, right eye: Secondary | ICD-10-CM | POA: Diagnosis not present

## 2022-02-20 DIAGNOSIS — H524 Presbyopia: Secondary | ICD-10-CM | POA: Diagnosis not present

## 2022-02-24 ENCOUNTER — Encounter: Payer: Self-pay | Admitting: Cardiothoracic Surgery

## 2022-02-24 ENCOUNTER — Ambulatory Visit: Payer: Medicare Other | Admitting: Cardiothoracic Surgery

## 2022-02-24 ENCOUNTER — Other Ambulatory Visit: Payer: Self-pay | Admitting: Cardiothoracic Surgery

## 2022-02-24 VITALS — BP 144/77 | HR 65 | Resp 20 | Ht 70.0 in | Wt 224.0 lb

## 2022-02-24 DIAGNOSIS — Z952 Presence of prosthetic heart valve: Secondary | ICD-10-CM

## 2022-02-24 DIAGNOSIS — R0789 Other chest pain: Secondary | ICD-10-CM

## 2022-02-24 NOTE — Progress Notes (Signed)
HPI: ?Patient returns for follow-up after being hospitalized with oral streptococcal oralis bacteremia related to either dental work or biting his tongue January of this year.  Echo and CT of his prosthetic AVR and root replacement showed no evidence of abscess.  Finished a course of IV antibiotics with a PICC line and did well but had some transient atrial fibrillation requiring cardioversion.  He is now in sinus rhythm.  He has no chest complaints.  He does have complaints about his shoulder arthritis and intermittent abdominal pain in the area of a left descending colectomy for diverticulitis performed last year. ? ?Patient has not had a follow-up echo after IV antibiotics. ?He did have a follow-up blood culture following completion of his 6 weeks of IV antibiotics which is negative. ? ?He is having episodes of intermittent left mid abdominal pain associated with some flushing and diaphoresis but no fever.  Having very malodorous BMs.  He has had some weight loss since January approximately 10 pounds per month. ? ?Current Outpatient Medications  ?Medication Sig Dispense Refill  ? albuterol (VENTOLIN HFA) 108 (90 Base) MCG/ACT inhaler INHALE 2 PUFFS INTO THE LUNGS EVERY 6 HOURS AS NEEDED FOR WHEEZING OR SHORTNESS OF BREATH (Patient taking differently: 2 puffs every 6 (six) hours as needed for shortness of breath.) 18 g 5  ? ALPRAZolam (XANAX) 0.25 MG tablet Take 1 tablet (0.25 mg total) by mouth 3 (three) times daily as needed for anxiety. 15 tablet 0  ? aluminum hydroxide-magnesium carbonate (GAVISCON) 95-358 MG/15ML SUSP Take 15 mLs by mouth 3 (three) times daily as needed for indigestion or heartburn.    ? azelastine (ASTELIN) 137 MCG/SPRAY nasal spray Place 1-2 sprays into the nose 2 (two) times daily as needed for rhinitis or allergies.     ? benzonatate (TESSALON) 200 MG capsule Take 1 capsule (200 mg total) by mouth 3 (three) times daily as needed for cough. 60 capsule 3  ? budesonide-formoterol (SYMBICORT)  160-4.5 MCG/ACT inhaler Inhale 2 puffs into the lungs in the morning and at bedtime. 1 each 5  ? COLCRYS 0.6 MG tablet Take 0.6 mg by mouth in the morning.    ? Dextromethorphan HBr (DELSYM PO) Take by mouth.    ? diclofenac Sodium (VOLTAREN) 1 % GEL Apply 2 g topically 3 (three) times daily as needed (arthritis pain.).    ? diphenhydrAMINE (BENADRYL) 25 mg capsule Take 50 mg by mouth daily as needed (allergies.).    ? EPINEPHrine 0.3 mg/0.3 mL IJ SOAJ injection Inject 0.3 mLs (0.3 mg total) into the muscle as needed (for angioedema). 2 Device 0  ? Erenumab-aooe (AIMOVIG) 140 MG/ML SOAJ Inject 140 mg into the skin every 28 (twenty-eight) days. 1 mL 11  ? Ferrous Sulfate (IRON) 142 (45 Fe) MG TBCR Take 45 mg by mouth in the morning and at bedtime.    ? finasteride (PROSCAR) 5 MG tablet Take 5 mg by mouth at bedtime.     ? levothyroxine (SYNTHROID, LEVOTHROID) 112 MCG tablet Take 112 mcg by mouth daily before breakfast.     ? liothyronine (CYTOMEL) 25 MCG tablet Take 25 mcg by mouth daily before breakfast.    ? losartan (COZAAR) 50 MG tablet TAKE 1 TABLET BY MOUTH EVERY DAY 30 tablet 6  ? Menthol, Topical Analgesic, (BLUE-EMU MAXIMUM STRENGTH EX) Apply 1 application topically 3 (three) times daily as needed (arthritis pain.).    ? Methylcellulose, Laxative, (FIBER THERAPY) 500 MG TABS Take 1,000 mg by mouth in the morning.    ?  metoprolol tartrate (LOPRESSOR) 50 MG tablet Take 1 tablet (50 mg total) by mouth 2 (two) times daily. 180 tablet 3  ? Multiple Vitamin (MULTIVITAMIN WITH MINERALS) TABS tablet Take 1 tablet by mouth in the morning.    ? NON FORMULARY Pt uses a cpap nightly    ? olopatadine (PATANOL) 0.1 % ophthalmic solution Place 1 drop into both eyes 2 (two) times daily as needed for allergies.    ? omeprazole (PRILOSEC) 40 MG capsule Take 1 capsule (40 mg total) by mouth 2 (two) times daily. 180 capsule 1  ? Pseudoephedrine-guaiFENesin (MUCINEX D PO) Take by mouth.    ? rivaroxaban (XARELTO) 20 MG TABS  tablet Take 1 tablet (20 mg total) by mouth daily with supper. 90 tablet 3  ? rizatriptan (MAXALT) 10 MG tablet Take 1 tablet (10 mg total) by mouth as needed for migraine. May repeat in 2 hours if needed 30 tablet 3  ? saccharomyces boulardii (FLORASTOR) 250 MG capsule Take 250 mg by mouth daily as needed (when taking antibiotics).    ? sodium chloride (OCEAN) 0.65 % SOLN nasal spray Place 1 spray into both nostrils as needed for congestion.    ? tamsulosin (FLOMAX) 0.4 MG CAPS capsule Take 0.4 mg by mouth in the morning and at bedtime.    ? Testosterone Cypionate 200 MG/ML SOLN Inject 100 mg into the muscle every Friday.    ? venlafaxine (EFFEXOR-XR) 150 MG 24 hr capsule Take 150 mg by mouth at bedtime.    ? zolpidem (AMBIEN) 10 MG tablet Take 10 mg by mouth at bedtime as needed for sleep.    ? ?No current facility-administered medications for this visit.  ? ? ? ?Physical Exam: ?Vitals:  ? 02/24/22 1212  ?BP: (!) 144/77  ?Pulse: 65  ?Resp: 20  ?SpO2: 99%  ? ?Lungs clear ?Sternal incision stable ?Heart rhythm regular with 2/6 systolic flow murmur.  No diastolic murmur. ?Abdomen without guarding or tenderness ? ? ?Diagnostic Tests: ?Chest x-ray performed last month shows clear lung fields.  No change in sternal wires. ? ?Impression: ?Recent episode of strep oralis bacteremia treated with IV antibiotics.  TEE and cardiac CT at that time showed no evidence of endocarditis. ? ?Plan: ?Recheck blood cultures to confirm he has not had recurrent bacteremia ?Post therapy echocardiogram to check aortic valve ?We will schedule for CTA of chest and abdomen to assess his recent left mid to lower abdominal pains.  Return for follow-up in about 3 weeks. ? ? ?[name], MD ?[facility] ?[phone] ? ? ? ? ? ? ?

## 2022-02-25 DIAGNOSIS — Z952 Presence of prosthetic heart valve: Secondary | ICD-10-CM | POA: Diagnosis not present

## 2022-02-28 ENCOUNTER — Ambulatory Visit (HOSPITAL_COMMUNITY): Payer: Medicare Other | Attending: Cardiology

## 2022-02-28 ENCOUNTER — Telehealth: Payer: Self-pay

## 2022-02-28 DIAGNOSIS — Z952 Presence of prosthetic heart valve: Secondary | ICD-10-CM | POA: Diagnosis not present

## 2022-02-28 LAB — ECHOCARDIOGRAM COMPLETE
AR max vel: 1.3 cm2
AV Area VTI: 1.42 cm2
AV Area mean vel: 1.42 cm2
AV Mean grad: 20.3 mmHg
AV Peak grad: 40 mmHg
Ao pk vel: 3.16 m/s
Area-P 1/2: 2.67 cm2
S' Lateral: 3.4 cm

## 2022-02-28 NOTE — Telephone Encounter (Signed)
Called patient's wife from receiving note from front office staff. Patient's wife stated patient had an episode earlier today. Patient was sitting down working with a tool and started to get up, due to not feeling well. Patient's wife stated he looked very pale and he felt lightheaded. Patient's BP was 143/85, HR dropped to 35 to 55, now 60's, O2 was in the 70's  & 80's , patient put on his CPAP and oxygen come up to O2 97 %. Patient stated he had a cough, dizziness and then felt like he was going to pass out. Patient had breakfast that morning, but it was lunch time when all this happened. Consulted, Christen Bame NP for advisement. She recommends patient see someone next week, and if that happens again to go to the ED. Made patient an appointment with Dr. [name] on Wednesday of next week. Patient and his wife agreed to plan. Will forward to Dr. [name] so he is aware. ?

## 2022-02-28 NOTE — Telephone Encounter (Signed)
-----   Message from Judie Grieve sent at 02/28/2022  3:42 PM EDT ----- ?Regarding: Wife would like a call ?Good afternoon Pam, ? ?Wife would like to speak with you when you can about his condition, ? ?She is in the Echo lobby if you would like to speak with her, I confirmed the primary contact number for him. ? ?I told her you may be busy and would speak/call when you could. ? ?Thank you! ? ?

## 2022-03-03 ENCOUNTER — Telehealth: Payer: Self-pay | Admitting: *Deleted

## 2022-03-03 ENCOUNTER — Telehealth: Payer: Self-pay | Admitting: Internal Medicine

## 2022-03-03 LAB — CULTURE, BLOOD (SINGLE)
MICRO NUMBER:: 13371111
MICRO NUMBER:: 13371115
Result:: NO GROWTH
Result:: NO GROWTH
SPECIMEN QUALITY:: ADEQUATE
SPECIMEN QUALITY:: ADEQUATE

## 2022-03-03 NOTE — Telephone Encounter (Signed)
Called and spoke with [facility]. Juliann Pulse states that patient wants to switch back to Dr. Annamaria Boots. Juliann Pulse states that matty would feel more comfortable with seeing Dr. Annamaria Boots.  ? ?CY are you okay with this switch?  ?MW are you okay with this switch? ?

## 2022-03-03 NOTE — Telephone Encounter (Signed)
Called and spoke with Juliann Pulse. Ulla Potash MW and CY are okay with [facility] switching back to CY.  ? ?Patient has been scheduled for an appointment with CY for 03/04/2022.  ? ?Nothing further needed.  ?

## 2022-03-03 NOTE — Telephone Encounter (Signed)
Yes fine with me 

## 2022-03-03 NOTE — Telephone Encounter (Signed)
Per Dr. Lucianne Lei Trigt's request, patient's wife contacted and advised that blood cultures and ECHO came back normal. Wife states patient had an episode on Friday where his pulse ox dropped to 71% with HR ranging from 35-50. A family friend came over and placed the patient on his CPAP machine bringing his saturations up to the 90's. Wife states they spoke with Cardiology who advised he contact his Pulmonologist. Per wife, she was going to get him an appt this week. Message sent to Dr. Prescott Gum, per wife's request, to make him aware.  ?

## 2022-03-03 NOTE — Telephone Encounter (Signed)
Ok with me 

## 2022-03-04 ENCOUNTER — Ambulatory Visit: Payer: Medicare Other | Admitting: Internal Medicine

## 2022-03-04 ENCOUNTER — Other Ambulatory Visit (HOSPITAL_COMMUNITY): Payer: Self-pay

## 2022-03-04 ENCOUNTER — Encounter: Payer: Self-pay | Admitting: Internal Medicine

## 2022-03-04 VITALS — BP 122/70 | HR 63 | Temp 97.7°F | Ht 70.0 in | Wt 234.2 lb

## 2022-03-04 DIAGNOSIS — Z9989 Dependence on other enabling machines and devices: Secondary | ICD-10-CM

## 2022-03-04 DIAGNOSIS — J410 Simple chronic bronchitis: Secondary | ICD-10-CM | POA: Diagnosis not present

## 2022-03-04 DIAGNOSIS — J45909 Unspecified asthma, uncomplicated: Secondary | ICD-10-CM

## 2022-03-04 DIAGNOSIS — G4733 Obstructive sleep apnea (adult) (pediatric): Secondary | ICD-10-CM

## 2022-03-04 MED ORDER — DOXYCYCLINE HYCLATE 100 MG PO TABS: 100.0000 mg | ORAL_TABLET | Freq: Two times a day (BID) | ORAL | 0 refills | Status: DC

## 2022-03-04 NOTE — Patient Instructions (Addendum)
Order- Schedule PFT dx chronic bronchitis ? ?Order- DME [employer]- please replace old CPAP machine, change to auto 5-15, mask of choice, humidifier, supplies, AirView/ card ? ?Script sent for doxycycline antibiotic ? ? ?

## 2022-03-04 NOTE — Assessment & Plan Note (Signed)
He describes chronic bronchitis symptoms with green sputum and is receptive to suggestion that we try doxycycline to clear this.  Watching him, I question if some of his cough and airway complaints might be a tic, perhaps with components of cough equivalent asthma, intermittent mild reflux. ?Plan-doxycycline, schedule PFT ?

## 2022-03-04 NOTE — Progress Notes (Signed)
[name]: ?. male former smoker with asthma/chronic bronchitis and allergic rhinitis, OSA PAF/ anticoagulation, aortic valve replacement, toxic encephalopathy, light sensitivity, chronic fatigue/fibromyalgia ?NPSG 03/24/03- AHI 88/ hr, desaturation to 75%, body weight 244 lbs ?PFT 06/01/17-WNL-FVC 4.46/96%, FEV1 3.71/106%, ratio 0.83, FEF 25-75% 4.64/167%, no response to dilator, TLC 107%, DLCO 101%  ?------------------------------------------------------------------ ? ? ?11/05/17- 71 year old male former smoker followed for asthma/chronic bronchitis and allergic rhinitis,  OSA,  complicated by PAF/ anticoagulation, aortic valve replacemnt, toxic encephalopathy, light sensitivity, chronic fatigue/fibromyalgia ?CPAP 12/Advanced ?----CPAP follow up, ongoing congestion, post nasal drip, wet cough.  ?Download 100% compliance, AHI 0.7/hour. "I love my CPAP". ?He says everything is doing better since he had his valve replacement.  Rarely needs rescue inhaler now.  Has some days of increased shortness of breath occasionally during which he would use Symbicort but finds if he uses that, or Advair, for more than a couple of weeks at a time he gets a dry cough. ?Rhinitis with postnasal drip treated with Flonase or Astelin.  Both cause nosebleeds if used routinely. ?Some increase in cough recently he thinks is related to reflux.  Continues omeprazole twice daily but never raised the head of his bed. ?CXR-08/05/17 ?RESSION: ?1. No acute cardiopulmonary abnormalities. ? ?03/04/22-71 year old male former smoker followed for asthma/chronic bronchitis and allergic rhinitis,  OSA,  complicated by PAF/ anticoagulation, Aortic Valve Replacement, Toxic Encephalopathy, Light sensitivity, Chronic Fatigue/fibromyalgia ?CPAP 12/Advanced ?-----Patient and his wife state that he's been having congestion and throat feels "constricted" sats have been up and down. Some increased shortness of breath with exertion. Would like new  cpap machine ?Patient had worked with Dr. Melvyn Novas for a few years and recently switched back to me.  Wife is with him and provides much of the history.  Hospitalized in January for sepsis associated with possibly a bad tooth, on prolonged antibiotic until March.  Now complains of intermittent cough, sometimes green sputum since March.  Feels "constricted in my upper lung", pointing to his throat and upper tracheal area.  He associates this with frequent need to cough and occasional difficulty swallowing.  I asked if it could be a tic and wife thought that the sounded appropriate since he has other tics and twitches. ?She "chases him" with sips of water and [employer] candies, Mucinex and Tessalon. ?Chest x-ray was clear at [facility] April 11 by report. ?Pending follow-up with cardiology and thoracic surgery. ? ?ROS-see HPI     + = pos ?Constitutional:   No-   weight loss, night sweats, fevers, chills, fatigue, lassitude. ?HEENT:   No-  headaches, difficulty swallowing, tooth/dental problems,  sore throat,  ?     No-  sneezing, itching, ear ache,  ?                  No-nasal congestion, + post nasal drip,  ?CV:  No-   chest pain, orthopnea, PND, swelling in lower extremities, anasarca,  dizziness, palpitations ?Resp: No- coughing up of blood.   ?           + change in color of mucus.  No- wheezing.   ?Skin: Clear ?GI:  + heartburn, indigestion, abdominal pain, nausea, vomiting,  ?GU:  ?MS:  No-   joint pain or swelling.   ?Neuro-     nothing unusual ?Psych:  No- change in mood or affect. + depression or anxiety.  No memory loss. ? ?OBJ- Physical Exam ?General- Alert, Oriented, Affect-appropriate, Distress- none acute.  ?Skin- +eczematoid patches on  lower calves, nonspecific, excoriated ?Lymphadenopathy- none ?Head- atraumatic. Stares aimlessly around room like a blind person.  ?           Eyes- Gross vision intact, PERRLA, conjunctivae and secretions clear,  ?+ sunglasses ?           Ears- Hearing, canals-normal ?            Nose- Clear, no-Septal dev, mucus, polyps, erosion, perforation  ?           Throat- Mallampati II-III, mucosa , drainage- none, tonsils- atrophic ?Neck- flexible , trachea midline, no stridor , thyroid nl, carotid no bruit ?Chest - symmetrical excursion , unlabored ?          Heart/CV- RRR ,  murmur+2-3/6 , no gallop  , no rub, nl s1 s2,  ?                          - JVD- none , edema- none, stasis changes- none, varices- none ?          Lung- + clear/ unlabored, wheeze- none, cough+, dullness-none, rub- none ?          Chest wall-  ?Abd-  ?Br/ Gen/ Rectal- Not done, not indicated ?Extrem- cyanosis- none, clubbing, none, atrophy- none, strength- nl ?Neuro- grossly intact to observation ? ? ? ? ?

## 2022-03-04 NOTE — Assessment & Plan Note (Signed)
Patient and wife report ongoing benefit from CPAP with good compliance. ?Plan-replace old machine, changing to AutoPap 5-15 ?

## 2022-03-05 ENCOUNTER — Encounter: Payer: Self-pay | Admitting: Cardiovascular Disease

## 2022-03-05 ENCOUNTER — Ambulatory Visit: Payer: Medicare Other | Admitting: Cardiovascular Disease

## 2022-03-05 VITALS — BP 128/68 | HR 62 | Ht 70.0 in | Wt 238.0 lb

## 2022-03-05 DIAGNOSIS — I483 Typical atrial flutter: Secondary | ICD-10-CM

## 2022-03-05 DIAGNOSIS — G4733 Obstructive sleep apnea (adult) (pediatric): Secondary | ICD-10-CM

## 2022-03-05 DIAGNOSIS — I480 Paroxysmal atrial fibrillation: Secondary | ICD-10-CM

## 2022-03-05 DIAGNOSIS — R7881 Bacteremia: Secondary | ICD-10-CM | POA: Diagnosis not present

## 2022-03-05 DIAGNOSIS — Z952 Presence of prosthetic heart valve: Secondary | ICD-10-CM

## 2022-03-05 DIAGNOSIS — B955 Unspecified streptococcus as the cause of diseases classified elsewhere: Secondary | ICD-10-CM

## 2022-03-05 NOTE — Patient Instructions (Signed)

## 2022-03-05 NOTE — Progress Notes (Signed)
? ?Date:  03/05/2022  ? ?ID:  BYRD RUSHLOW, DOB 21-Jan-1951, MRN [id number] ? ?PCP:  Shirline Frees, MD  ?Cardiologist: Dr. [name]  ?EP: Dr. [name] ? ?Chief Complaint: PAF/AVR  ? [name]:  ? ?71 y.o. 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 ?Had recurrent PAF 07/28/17 converted with iv cardizem and seen by EP Dr TLovena Lewho recommended amiodarone 200 mg daily and changed back to  xarelto for anticoagulation  ? ?History of  angioedema. 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 ?[place] a [place]Known fracture of sternal wires but no issues  ? ?Needs robotic sigmoid resection with Dr GJohney Maine Tested preoperative positive for COVID 05/22/21 Surgery rescheduled for 06/12/21 He has had recurrent diverticulitis with stricture  ? ?TTE: 05/01/21 EF 50-55% chronically mildly elevated [facility] ? ? He was admitted in January with a febrile illness and grew out strep mitis/oralis on blood cultures.  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  A gated cardiac CTA was also suspicious but felt that circumferential thickening could be related to pledget material from the time of surgery.  D/c after prolonged hospital stay 11/05/21 Developed PAF/flutter around the time of PIC line change CXR showed PIC line at cavoatrial junction Rx xarelto and lopressor for rate control He was cardioverted in the EP lab by Dr  LQuentin Oreon 11/28/21  ? ?Called office 02/28/22 postural paleness and dizzy? Low pulse BP ok oxygen 70% range per wife Improved wearing his CPAP Seen by pulmonary Dr YAnnamaria Boots5/16 feeling of throat constriction and green sputum since March Feels better on doxycyline already ? ? ?Past Medical History:  ?Diagnosis Date  ? Allergic rhinitis   ? Anxiety   ? Arthritis   ? Asthma   ? Bladder tumor   ? Chronic fatigue   ? COVID-19 05/22/2021  ? Depression   ? 06/30/17 Pt denies being depressed, reports Effexor is taken for Chronic Fatigue   ? Dyspnea   ? Enlarged prostate   ? Fibromyalgia   ? GERD (gastroesophageal reflux disease)   ? Headache   ? History of chronic bronchitis   ? History of migraine   ? History of toxic encephalopathy   ? Hypothyroidism   ? OSA on CPAP   ? CPAP 14  ? PAF (paroxysmal atrial fibrillation) ([facility] -- DR NJohnsie Cancel ? DX OCT 2013  ? S/P AVR (aortic valve replacement) and aortoplasty   ? Sensitiveness to light   ? Unspecified essential hypertension   ? Urethral tumor   ? PROSTATIC  ? ? ?Past Surgical History:  ?Procedure Laterality Date  ? BENTALL PROCEDURE N/A 07/02/2017  ? Procedure: BENTALL PROCEDURE;  Surgeon: VIvin Poot MD;  [place]  Service: Open Heart Surgery;  Laterality: N/A;  WITH CIRC ARREST  ? BIOPSY  02/06/2021  ?  Procedure: BIOPSY;  Surgeon: Clarene Essex, MD;  [facility];  Service: Endoscopy;;  ? BUBBLE STUDY  11/01/2021  ? Procedure: BUBBLE STUDY;  Surgeon: Geralynn Rile, MD;  Location: [place];  Service: Cardiovascular;;  ? CARDIOVERSION N/A 11/28/2021  ? Procedure: CARDIOVERSION (CATH LAB);  Surgeon: Vickie Epley, MD;  Location: [place] [facility];  Service: Cardiovascular;  Laterality: N/A;  ? COLONOSCOPY    ? COLONOSCOPY WITH PROPOFOL N/A 02/06/2021  ? Procedure: COLONOSCOPY WITH PROPOFOL;  Surgeon: Clarene Essex, MD;  Location: [facility];  Service: Endoscopy;  Laterality: N/A;  ? CYSTOSCOPY W/ RETROGRADES Bilateral 06/15/2013  ?  Procedure: CYSTOSCOPY WITH BILATERAL RETROGRADE PYELOGRAM  BLADDER BIOPSY, PROSTATIC URETHRAL BIOPSY, ;  Surgeon: Molli Hazard, MD;  Location: [facility];  Service: Urology;  Laterality: Bilateral;  ? ESOPHAGOGASTRODUODENOSCOPY (EGD) WITH PROPOFOL N/A 03/11/2018  ? Procedure: ESOPHAGOGASTRODUODENOSCOPY (EGD) WITH PROPOFOL;  Surgeon: Clarene Essex, MD;  Location: [place];  Service: Endoscopy;  Laterality: N/A;  ? LAPAROSCOPIC CHOLECYSTECTOMY  01/14/2001  ? PROCTOSCOPY N/A 06/12/2021  ? Procedure: RIGID PROCTOSCOPY;  Surgeon: [name], MD;  Location: [facility];  Service: General;  Laterality: N/A;  ? RIGHT/LEFT HEART CATH AND CORONARY ANGIOGRAPHY N/A 06/12/2017  ? Procedure: RIGHT/LEFT HEART CATH AND CORONARY ANGIOGRAPHY;  Surgeon: Larey Dresser, MD;  Location: [place] [facility];  Service: Cardiovascular;  Laterality: N/A;  ? TEE WITHOUT CARDIOVERSION N/A 07/02/2017  ? Procedure: TRANSESOPHAGEAL ECHOCARDIOGRAM (TEE);  Surgeon: Prescott Gum, Collier Salina, MD;  Location: [place];  Service: Open Heart Surgery;  Laterality: N/A;  ? TEE WITHOUT CARDIOVERSION N/A 11/01/2021  ? Procedure: TRANSESOPHAGEAL ECHOCARDIOGRAM (TEE);  Surgeon: Geralynn Rile, MD;  Location: [place];  Service: Cardiovascular;  Laterality: N/A;  ? UMBILICAL HERNIA REPAIR  01/14/2008  ? ? ?Current Medications: ?Prior to Admission medications   ?Medication Sig Start Date End Date Taking? Authorizing Provider  ?acetaminophen (TYLENOL) 500 MG tablet Take 500 mg by mouth every 6 (six) hours as needed for moderate pain.    [provider]  ?albuterol (VENTOLIN HFA) 108 (90 Base) MCG/ACT inhaler INHALE 2 PUFFS INTO LUNGS EVERY 6 HOURS AS NEEDED FOR WHEEZING OR SHORTNESS OF BREATH 01/19/17   Baird Lyons D, MD  ?amiodarone (PACERONE) 200 MG tablet Take 1 tablet (200 mg total) by mouth daily. 07/29/17   Leanor Kail, PA  ?aspirin 81 MG EC tablet Take 1 tablet (81 mg total) by mouth daily. 07/13/17   Saed Giovanni,  PA-C  ?azelastine (ASTELIN) 137 MCG/SPRAY nasal spray Place 1-2 sprays into the nose 2 (two) times daily as needed for rhinitis or allergies.     [provider]  ?benzonatate (TESSALON) 200 MG capsule Take 1 capsule (200 mg total) by mouth every 6 (six) hours as needed for cough. 11/19/16   Baird Lyons D, MD  ?budesonide-formoterol (SYMBICORT) 160-4.5 MCG/ACT inhaler INHALE 2 PUFF INTO THE LUNGS 2 TIMES A DAY. 01/19/17   Baird Lyons D, MD  ?Cholecalciferol (VITAMIN D-3) 5000 UNITS TABS Take 5,000 Units by mouth daily with breakfast.     [provider]  ?COLCRYS 0.6 MG tablet Take 0.6 mg by mouth daily. With breakfast 01/29/12   [provider]  ?Cyanocobalamin (VITAMIN B12 SL) Place 1,200 mcg under the tongue daily. 1 dropper    [provider]  ?dextromethorphan (DELSYM) 30 MG/5ML liquid Take 30 mg by mouth at bedtime as needed for cough.     [provider]  ?DHEA 50 MG TABS  Take 50 mg by mouth daily with breakfast.     [provider]  ?fexofenadine (ALLEGRA) 180 MG tablet Take 180 mg by mouth daily with breakfast.     [provider]  ?finasteride (PROSCAR) 5 MG tablet Take 5 mg by mouth at bedtime.  01/14/14   [provider]  ?Hypromellose Vertell Limber ALLERGY NASAL SPRAY NA) Place 1 spray into both nostrils daily as needed (for allergies).     [provider]  ?levothyroxine (SYNTHROID, LEVOTHROID) 112 MCG tablet Take 112 mcg by mouth daily before breakfast.     [provider]  ?liothyronine (CYTOMEL) 25 MCG tablet Take 25 mcg by mouth every morning.     [provider]  ?magic mouthwash w/lidocaine SOLN Take 10 mLs by mouth 4 (four) times daily as needed for mouth pain. 07/16/17   Deneise Lever, MD  ?Menthol, Topical Analgesic, (BLUE-EMU MAXIMUM STRENGTH EX) Apply 1 application topically 4 (four) times daily as needed (for arthritis pain.).    [provider]  ?metoprolol tartrate (LOPRESSOR) 50 MG  tablet Take 1 tablet (50 mg total) by mouth 2 (two) times daily. 07/29/17   Bhagat, Crista Luria, PA  ?milk thistle 175 MG tablet Take 175 mg by mouth daily.    [provider]  ?Multiple Vitamin

## 2022-03-09 ENCOUNTER — Encounter: Payer: Self-pay | Admitting: Internal Medicine

## 2022-03-10 DIAGNOSIS — G4733 Obstructive sleep apnea (adult) (pediatric): Secondary | ICD-10-CM | POA: Diagnosis not present

## 2022-03-11 DIAGNOSIS — M5136 Other intervertebral disc degeneration, lumbar region: Secondary | ICD-10-CM | POA: Diagnosis not present

## 2022-03-11 DIAGNOSIS — M9905 Segmental and somatic dysfunction of pelvic region: Secondary | ICD-10-CM | POA: Diagnosis not present

## 2022-03-11 DIAGNOSIS — M9904 Segmental and somatic dysfunction of sacral region: Secondary | ICD-10-CM | POA: Diagnosis not present

## 2022-03-11 DIAGNOSIS — M9903 Segmental and somatic dysfunction of lumbar region: Secondary | ICD-10-CM | POA: Diagnosis not present

## 2022-03-19 ENCOUNTER — Telehealth: Payer: Self-pay | Admitting: Neurology

## 2022-03-19 DIAGNOSIS — M9904 Segmental and somatic dysfunction of sacral region: Secondary | ICD-10-CM | POA: Diagnosis not present

## 2022-03-19 DIAGNOSIS — M5136 Other intervertebral disc degeneration, lumbar region: Secondary | ICD-10-CM | POA: Diagnosis not present

## 2022-03-19 DIAGNOSIS — M9905 Segmental and somatic dysfunction of pelvic region: Secondary | ICD-10-CM | POA: Diagnosis not present

## 2022-03-19 DIAGNOSIS — M9903 Segmental and somatic dysfunction of lumbar region: Secondary | ICD-10-CM | POA: Diagnosis not present

## 2022-03-19 NOTE — Telephone Encounter (Signed)
Give papers to Dr. Delice Lesch

## 2022-03-19 NOTE — Telephone Encounter (Signed)
Patient's wife dropped off forms for [employer] patient assistance. Placed in [place].

## 2022-03-21 DIAGNOSIS — G4733 Obstructive sleep apnea (adult) (pediatric): Secondary | ICD-10-CM | POA: Diagnosis not present

## 2022-03-24 ENCOUNTER — Ambulatory Visit: Payer: Medicare Other | Admitting: Cardiothoracic Surgery

## 2022-03-24 ENCOUNTER — Other Ambulatory Visit: Payer: Self-pay | Admitting: Cardiothoracic Surgery

## 2022-03-24 ENCOUNTER — Encounter: Payer: Self-pay | Admitting: Cardiothoracic Surgery

## 2022-03-24 ENCOUNTER — Ambulatory Visit
Admission: RE | Admit: 2022-03-24 | Discharge: 2022-03-24 | Disposition: A | Discharge status: Home / Self Care | Payer: Medicare Other | Source: Ambulatory Visit | Attending: Cardiothoracic Surgery | Admitting: Cardiothoracic Surgery

## 2022-03-24 VITALS — BP 171/91 | HR 66 | Resp 20 | Ht 70.0 in | Wt 225.0 lb

## 2022-03-24 DIAGNOSIS — L905 Scar conditions and fibrosis of skin: Secondary | ICD-10-CM

## 2022-03-24 DIAGNOSIS — Z9889 Other specified postprocedural states: Secondary | ICD-10-CM | POA: Insufficient documentation

## 2022-03-24 DIAGNOSIS — I700 Atherosclerosis of aorta: Secondary | ICD-10-CM | POA: Diagnosis not present

## 2022-03-24 DIAGNOSIS — Z952 Presence of prosthetic heart valve: Secondary | ICD-10-CM

## 2022-03-24 DIAGNOSIS — M47816 Spondylosis without myelopathy or radiculopathy, lumbar region: Secondary | ICD-10-CM | POA: Diagnosis not present

## 2022-03-24 DIAGNOSIS — R0789 Other chest pain: Secondary | ICD-10-CM

## 2022-03-24 DIAGNOSIS — R918 Other nonspecific abnormal finding of lung field: Secondary | ICD-10-CM | POA: Diagnosis not present

## 2022-03-24 DIAGNOSIS — M47814 Spondylosis without myelopathy or radiculopathy, thoracic region: Secondary | ICD-10-CM | POA: Diagnosis not present

## 2022-03-24 MED ORDER — IOPAMIDOL (ISOVUE-370) INJECTION 76%
75.0000 mL | Freq: Once | INTRAVENOUS | Status: AC | PRN
  Administered 2022-03-24: 75 mL via INTRAVENOUS

## 2022-03-24 NOTE — Progress Notes (Signed)
HPI: Patient returns for planned follow-up visit after hospitalization approximately 6 months ago for gram-positive bacteremia.  At that time a chest CT scan was performed with an echocardiogram to assess his biologic Bentall root replacement performed several years ago.  Findings were unremarkable and he finished a course of extended antibiotics.  He is not doing well without symptoms of chest pain or shortness of breath.  His weight is stable.  No peripheral edema.  Echocardiogram performed last month showed normal LV function.  Normal function of the bioprosthetic 25 mm Edwards valve.  Today a CTA of his aortic root and thoracic aorta shows intact repair of the root aneurysm.  No pseudoaneurysm.  No aortic mural ulceration or hematoma.  Patient has hypertensive today but was upset over the IV for his CTA. Current Outpatient Medications  Medication Sig Dispense Refill   albuterol (VENTOLIN HFA) 108 (90 Base) MCG/ACT inhaler INHALE 2 PUFFS INTO THE LUNGS EVERY 6 HOURS AS NEEDED FOR WHEEZING OR SHORTNESS OF BREATH (Patient taking differently: 2 puffs every 6 (six) hours as needed for shortness of breath.) 18 g 5   ALPRAZolam (XANAX) 0.25 MG tablet Take 1 tablet (0.25 mg total) by mouth 3 (three) times daily as needed for anxiety. 15 tablet 0   aluminum hydroxide-magnesium carbonate (GAVISCON) 95-358 MG/15ML SUSP Take 15 mLs by mouth 3 (three) times daily as needed for indigestion or heartburn.     azelastine (ASTELIN) 137 MCG/SPRAY nasal spray Place 1-2 sprays into the nose 2 (two) times daily as needed for rhinitis or allergies.      benzonatate (TESSALON) 200 MG capsule Take 1 capsule (200 mg total) by mouth 3 (three) times daily as needed for cough. 60 capsule 3   budesonide-formoterol (SYMBICORT) 160-4.5 MCG/ACT inhaler Inhale 2 puffs into the lungs in the morning and at bedtime. 1 each 5   cetirizine (ZYRTEC) 10 MG tablet Take 10 mg by mouth daily.     COLCRYS 0.6 MG tablet Take 0.6 mg by  mouth in the morning.     Dextromethorphan HBr (DELSYM PO) Take by mouth as directed.     diclofenac Sodium (VOLTAREN) 1 % GEL Apply 2 g topically 3 (three) times daily as needed (arthritis pain.).     diphenhydrAMINE (BENADRYL) 25 mg capsule Take 50 mg by mouth daily as needed (allergies.).     doxycycline (VIBRA-TABS) 100 MG tablet Take 1 tablet (100 mg total) by mouth 2 (two) times daily. 14 tablet 0   EPINEPHrine 0.3 mg/0.3 mL IJ SOAJ injection Inject 0.3 mLs (0.3 mg total) into the muscle as needed (for angioedema). 2 Device 0   Erenumab-aooe (AIMOVIG) 140 MG/ML SOAJ Inject 140 mg into the skin every 28 (twenty-eight) days. 1 mL 11   Ferrous Sulfate (IRON) 142 (45 Fe) MG TBCR Take 45 mg by mouth in the morning and at bedtime. Only one tablet daily for the next 7 days (03/05/22)     finasteride (PROSCAR) 5 MG tablet Take 5 mg by mouth at bedtime.      guaiFENesin (MUCINEX) 600 MG 12 hr tablet Take 600 mg by mouth 2 (two) times daily as needed for to loosen phlegm or cough.     levothyroxine (SYNTHROID, LEVOTHROID) 112 MCG tablet Take 112 mcg by mouth daily before breakfast.      liothyronine (CYTOMEL) 25 MCG tablet Take 25 mcg by mouth daily before breakfast.     losartan (COZAAR) 50 MG tablet TAKE 1 TABLET BY MOUTH EVERY DAY 30 tablet 6  Menthol, Topical Analgesic, (BLUE-EMU MAXIMUM STRENGTH EX) Apply 1 application topically 3 (three) times daily as needed (arthritis pain.).     Methylcellulose, Laxative, (FIBER THERAPY) 500 MG TABS Take 1,000 mg by mouth in the morning.     metoprolol tartrate (LOPRESSOR) 50 MG tablet Take 1 tablet (50 mg total) by mouth 2 (two) times daily. 180 tablet 3   Multiple Vitamin (MULTIVITAMIN WITH MINERALS) TABS tablet Take 1 tablet by mouth in the morning.     NON FORMULARY Pt uses a cpap nightly     olopatadine (PATANOL) 0.1 % ophthalmic solution Place 1 drop into both eyes 2 (two) times daily as needed for allergies.     omeprazole (PRILOSEC) 40 MG capsule Take  1 capsule (40 mg total) by mouth 2 (two) times daily. 180 capsule 1   rivaroxaban (XARELTO) 20 MG TABS tablet Take 1 tablet (20 mg total) by mouth daily with supper. 90 tablet 3   rizatriptan (MAXALT) 10 MG tablet Take 1 tablet (10 mg total) by mouth as needed for migraine. May repeat in 2 hours if needed 30 tablet 3   saccharomyces boulardii (FLORASTOR) 250 MG capsule Take 250 mg by mouth daily as needed (when taking antibiotics).     sodium chloride (OCEAN) 0.65 % SOLN nasal spray Place 1 spray into both nostrils as needed for congestion.     tamsulosin (FLOMAX) 0.4 MG CAPS capsule Take 0.4 mg by mouth in the morning and at bedtime.     Testosterone Cypionate 200 MG/ML SOLN Inject 100 mg into the muscle. Every 7-10 days     venlafaxine (EFFEXOR-XR) 150 MG 24 hr capsule Take 150 mg by mouth at bedtime.     zolpidem (AMBIEN) 10 MG tablet Take 10 mg by mouth at bedtime as needed for sleep.     No current facility-administered medications for this visit.    Physical Exam: Blood pressure (!) 171/91, pulse 66, resp. rate 20, height '5\' 10"'$  (1.778 m), weight 225 lb (102.1 kg), SpO2 96 %.        Exam    General- alert and comfortable    Neck- no JVD, no cervical adenopathy palpable, no carotid bruit   Lungs- clear without rales, wheezes   Cor- regular rate and rhythm, no murmur , gallop   Abdomen- soft, non-tender   Extremities - warm, non-tender, minimal edema   Neuro- oriented, appropriate, no focal weakness   Diagnostic Tests: CTA of chest images personally reviewed.  Results as noted above discussed with patient and wife.  Intact prior bilateral-Bentall root replacement.  Impression: Patient doing well now with complaints mainly related to his abdomen.  He has an appoint to be seen by Dr. Johney Maine for possible modification of a wall mesh closure from prior colectomy for diverticular disease  Plan: Patient understands importance of blood pressure control.  Goal is to keep the systolic  blood pressure 833 or less. The patient understands importance of antibiotic prophylaxis prior to any dental or urologic procedure.  He understands that prosthetic valve endocarditis would be probably nonsurvivable.  We will repeat the CT of his thoracic aorta in 1 year. [name], MD [facility] [phone]

## 2022-03-26 ENCOUNTER — Encounter: Payer: Self-pay | Admitting: Internal Medicine

## 2022-03-26 DIAGNOSIS — M9903 Segmental and somatic dysfunction of lumbar region: Secondary | ICD-10-CM | POA: Diagnosis not present

## 2022-03-26 DIAGNOSIS — M9904 Segmental and somatic dysfunction of sacral region: Secondary | ICD-10-CM | POA: Diagnosis not present

## 2022-03-26 DIAGNOSIS — M5136 Other intervertebral disc degeneration, lumbar region: Secondary | ICD-10-CM | POA: Diagnosis not present

## 2022-03-26 DIAGNOSIS — M9905 Segmental and somatic dysfunction of pelvic region: Secondary | ICD-10-CM | POA: Diagnosis not present

## 2022-03-26 DIAGNOSIS — R972 Elevated prostate specific antigen [PSA]: Secondary | ICD-10-CM | POA: Diagnosis not present

## 2022-04-01 ENCOUNTER — Encounter: Payer: Self-pay | Admitting: Internal Medicine

## 2022-04-02 ENCOUNTER — Ambulatory Visit: Payer: Self-pay | Admitting: Surgery

## 2022-04-02 ENCOUNTER — Ambulatory Visit: Payer: Medicare Other | Admitting: Internal Medicine

## 2022-04-02 ENCOUNTER — Other Ambulatory Visit: Payer: Self-pay | Admitting: Cardiovascular Disease

## 2022-04-02 ENCOUNTER — Other Ambulatory Visit: Payer: Self-pay | Admitting: Adult Health

## 2022-04-02 DIAGNOSIS — M6208 Separation of muscle (nontraumatic), other site: Secondary | ICD-10-CM | POA: Diagnosis not present

## 2022-04-02 DIAGNOSIS — Z9889 Other specified postprocedural states: Secondary | ICD-10-CM | POA: Diagnosis not present

## 2022-04-02 DIAGNOSIS — N401 Enlarged prostate with lower urinary tract symptoms: Secondary | ICD-10-CM | POA: Diagnosis not present

## 2022-04-02 DIAGNOSIS — R351 Nocturia: Secondary | ICD-10-CM | POA: Diagnosis not present

## 2022-04-02 DIAGNOSIS — Z8719 Personal history of other diseases of the digestive system: Secondary | ICD-10-CM | POA: Diagnosis not present

## 2022-04-02 DIAGNOSIS — R1033 Periumbilical pain: Secondary | ICD-10-CM | POA: Diagnosis not present

## 2022-04-02 NOTE — Telephone Encounter (Signed)
Prescription refill request for Xarelto received.  Indication: PAF Last office visit: 03/05/22  Edmonia James MD Weight: 108kg Age: 71 Scr: 0.93 on 01/28/22 CrCl: 116.13  Based on above findings Xarelto '20mg'$  daily is the appropriate dose.  Refill approved.

## 2022-04-03 ENCOUNTER — Telehealth: Payer: Self-pay | Admitting: *Deleted

## 2022-04-03 NOTE — Telephone Encounter (Signed)
   Pre-operative Risk Assessment    Patient Name: Grant [name]  DOB: 1951-03-28 MRN: [id number]      Request for Surgical Clearance    Procedure:   HERNIA REPAIR SURGERY  Date of Surgery:  Clearance TBD                                 Surgeon:  DR. [name] Surgeon's Group or Practice Name:  [facility] Phone number:  [phone] Fax number:  [phone] ATTN: Illene Regulus, CMA   Type of Clearance Requested:   - Medical  - Pharmacy:  Hold Rivaroxaban (Xarelto)     Type of Anesthesia:  General    Additional requests/questions:    [name]   04/03/2022, 8:52 AM

## 2022-04-03 NOTE — Telephone Encounter (Signed)
  Patient with diagnosis of A fib on Xarelto for anticoagulation.  Patient has AVR from 2018.  Procedure: Hernia Repair Date of procedure: TBD   CHA2DS2-VASc Score = 3  This indicates a 3.2% annual risk of stroke. The patient's score is based upon: CHF History: 0 HTN History: 1 Diabetes History: 0 Stroke History: 0 Vascular Disease History: 1 Age Score: 1 Gender Score: 0    CrCl 111 mL/min Platelet count 172K  Per office protocol, patient can hold Xarelto for 2 days prior to procedure.

## 2022-04-03 NOTE — Telephone Encounter (Signed)
   Primary Cardiologist: [name], MD  Chart reviewed as part of pre-operative protocol coverage. Given past medical history and time since last visit, based on ACC/AHA guidelines, Grant [name] would be at acceptable risk for the planned procedure without further cardiovascular testing.   Per office protocol, patient can hold Xarelto for 2 days prior to procedure.    I will route this recommendation to the requesting party via Epic fax function and remove from pre-op pool.  Please call with questions.  Emmaline Life, NP-C    04/03/2022, 2:09 PM [facility] [address] Office [phone] Fax [phone]

## 2022-04-04 ENCOUNTER — Encounter: Payer: Self-pay | Admitting: Internal Medicine

## 2022-04-04 ENCOUNTER — Other Ambulatory Visit: Payer: Self-pay

## 2022-04-04 ENCOUNTER — Ambulatory Visit: Payer: Medicare Other | Admitting: Internal Medicine

## 2022-04-04 VITALS — BP 155/91 | HR 64 | Resp 16 | Ht 70.0 in | Wt 241.0 lb

## 2022-04-04 DIAGNOSIS — I330 Acute and subacute infective endocarditis: Secondary | ICD-10-CM

## 2022-04-04 NOTE — Progress Notes (Signed)
Patient Active Problem List   Diagnosis Date Noted   Midline sternotomy scar 03/24/2022   Acute infective endocarditis 11/09/2021   Medication monitoring encounter 11/09/2021   COVID-19    AKI (acute kidney injury) ([place]) 10/27/2021   Streptococcal bacteremia 10/27/2021   Hypomagnesemia 06/13/2021   Hypokalemia 06/13/2021   Colonic diverticular abscess 06/13/2021   Stricture of sigmoid colon ([place]) 06/12/2021   Anxiety 03/19/2021   Diverticulitis of colon 03/19/2021   Gout 03/19/2021   Hardening of the aorta (main artery of the heart) ([place]) 03/19/2021   Hypercoagulable state ([place]) 03/19/2021   Iron deficiency anemia 03/19/2021   Migraine 03/19/2021   Mixed hyperlipidemia 03/19/2021   Personal history of colonic polyps 03/19/2021   Recurrent major depression in remission ([place]) 03/19/2021   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   Acute diverticulitis 10/10/2020   Bilateral shoulder pain 10/26/2019   Morbid obesity due to excess calories ([place]) complicated by HBP, OSA/ gerd  07/07/2019   Upper airway cough syndrome 03/09/2019   Confusion with non-focal neuro exam 02/16/2019   Cough variant asthma vs UACS 11/15/2018   Cough 10/27/2018   Angioedema 03/07/2018   Sepsis ([place]) 03/07/2018   Asthma 03/07/2018   Hypothyroidism 03/07/2018   BPH (benign prostatic hyperplasia) 03/07/2018   Pain in joint of left shoulder 01/26/2018   Allergic reaction 01/21/2018   Atrial fibrillation with RVR ([place]) 07/28/2017   S/P AVR (aortic valve replacement) 07/02/2017   Eczema 01/01/2014   Hoarseness or changing voice 07/24/2013   PAF (paroxysmal atrial fibrillation) ([facility]) 05/18/2013   Chronic anticoagulation 05/18/2013   Preop cardiovascular exam 05/18/2013   Macular rash 08/10/2012   Acute asthmatic bronchitis 05/05/2012   Essential hypertension 06/17/2010   SYNCOPE 06/14/2010   DEPRESSION 02/11/2008   Obstructive sleep apnea 02/11/2008   Seasonal and perennial allergic rhinitis  02/11/2008   Chronic bronchitis ([place]) 02/11/2008   GERD (gastroesophageal reflux disease) 02/11/2008    Patient's Medications  New Prescriptions   No medications on file  Previous Medications   ALBUTEROL (VENTOLIN HFA) 108 (90 BASE) MCG/ACT INHALER    INHALE 2 PUFFS INTO THE LUNGS EVERY 6 HOURS AS NEEDED FOR WHEEZING OR SHORTNESS OF BREATH   ALPRAZOLAM (XANAX) 0.25 MG TABLET    Take 1 tablet (0.25 mg total) by mouth 3 (three) times daily as needed for anxiety.   ALUMINUM HYDROXIDE-MAGNESIUM CARBONATE (GAVISCON) 95-358 MG/15ML SUSP    Take 15 mLs by mouth 3 (three) times daily as needed for indigestion or heartburn.   AZELASTINE (ASTELIN) 137 MCG/SPRAY NASAL SPRAY    Place 1-2 sprays into the nose 2 (two) times daily as needed for rhinitis or allergies.    AZITHROMYCIN (ZITHROMAX) 250 MG TABLET    Take by mouth daily.   BENZONATATE (TESSALON) 200 MG CAPSULE    Take 1 capsule (200 mg total) by mouth 3 (three) times daily as needed for cough.   CETIRIZINE (ZYRTEC) 10 MG TABLET    Take 10 mg by mouth daily.   COLCRYS 0.6 MG TABLET    Take 0.6 mg by mouth in the morning.   DEXTROMETHORPHAN HBR (DELSYM PO)    Take by mouth as directed.   DICLOFENAC SODIUM (VOLTAREN) 1 % GEL    Apply 2 g topically 3 (three) times daily as needed (arthritis pain.).   DIPHENHYDRAMINE (BENADRYL) 25 MG CAPSULE    Take 50 mg by mouth daily as needed (allergies.).   EPINEPHRINE 0.3 MG/0.3 ML IJ SOAJ INJECTION  Inject 0.3 mLs (0.3 mg total) into the muscle as needed (for angioedema).   ERENUMAB-AOOE (AIMOVIG) 140 MG/ML SOAJ    Inject 140 mg into the skin every 28 (twenty-eight) days.   FERROUS SULFATE (IRON) 142 (45 FE) MG TBCR    Take 45 mg by mouth in the morning and at bedtime. Only one tablet daily for the next 7 days (03/05/22)   FINASTERIDE (PROSCAR) 5 MG TABLET    Take 5 mg by mouth at bedtime.    GUAIFENESIN (MUCINEX) 600 MG 12 HR TABLET    Take 600 mg by mouth 2 (two) times daily as needed for to loosen phlegm or  cough.   LEVOTHYROXINE (SYNTHROID, LEVOTHROID) 112 MCG TABLET    Take 112 mcg by mouth daily before breakfast.    LIOTHYRONINE (CYTOMEL) 25 MCG TABLET    Take 25 mcg by mouth daily before breakfast.   LOSARTAN (COZAAR) 50 MG TABLET    TAKE 1 TABLET BY MOUTH EVERY DAY   MENTHOL, TOPICAL ANALGESIC, (BLUE-EMU MAXIMUM STRENGTH EX)    Apply 1 application topically 3 (three) times daily as needed (arthritis pain.).   METHYLCELLULOSE, LAXATIVE, (FIBER THERAPY) 500 MG TABS    Take 1,000 mg by mouth in the morning.   METOPROLOL TARTRATE (LOPRESSOR) 50 MG TABLET    Take 1 tablet (50 mg total) by mouth 2 (two) times daily.   MULTIPLE VITAMIN (MULTIVITAMIN WITH MINERALS) TABS TABLET    Take 1 tablet by mouth in the morning.   NON FORMULARY    Pt uses a cpap nightly   OLOPATADINE (PATANOL) 0.1 % OPHTHALMIC SOLUTION    Place 1 drop into both eyes 2 (two) times daily as needed for allergies.   OMEPRAZOLE (PRILOSEC) 40 MG CAPSULE    Take 1 capsule (40 mg total) by mouth 2 (two) times daily.   RIVAROXABAN (XARELTO) 20 MG TABS TABLET    TAKE 1 TABLET(20 MG) BY MOUTH DAILY WITH SUPPER   RIZATRIPTAN (MAXALT) 10 MG TABLET    Take 1 tablet (10 mg total) by mouth as needed for migraine. May repeat in 2 hours if needed   SACCHAROMYCES BOULARDII (FLORASTOR) 250 MG CAPSULE    Take 250 mg by mouth daily as needed (when taking antibiotics).   SODIUM CHLORIDE (OCEAN) 0.65 % SOLN NASAL SPRAY    Place 1 spray into both nostrils as needed for congestion.   SYMBICORT 160-4.5 MCG/ACT INHALER    INHALE 2 PUFFS INTO THE LUNGS IN THE MORNING AND AT BEDTIME   TAMSULOSIN (FLOMAX) 0.4 MG CAPS CAPSULE    Take 0.4 mg by mouth in the morning and at bedtime.   TESTOSTERONE CYPIONATE 200 MG/ML SOLN    Inject 100 mg into the muscle. Every 7-10 days   VENLAFAXINE (EFFEXOR-XR) 150 MG 24 HR CAPSULE    Take 150 mg by mouth at bedtime.   ZOLPIDEM (AMBIEN) 10 MG TABLET    Take 10 mg by mouth at bedtime as needed for sleep.  Modified Medications    No medications on file  Discontinued Medications   DOXYCYCLINE (VIBRA-TABS) 100 MG TABLET    Take 1 tablet (100 mg total) by mouth 2 (two) times daily.    Subjective: Grant [name] is a 71 y.o. 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 He presents for concerns of Afib 2/2 PICC line. He had  a bleeding PICC line. Seen by my partner Dr. Alphonsa Gin and  placed on amoxicillin while awaiting line placement. He had a new PICC placed on 11/13/21. He developed a flutter. Seen by Dr. Copper, Cardiology. CXR done and noted appropriate position of PICC and reassured that PICC line is not related to afib/aflutter. 11/26/21: He and wife had similar concerns with PICC leading to aflutter. Discussed that it was unlikely PICC related.  Interval: SP cardioversion. Completed antibiotics on 12/16/21 and PICC pulled.  01/03/22: Pt presents with wife. Overall feels well. Has had a long standing cough.   Interim: Blood Cx negative with stable labs.Seen by CTS, CTA on 6/5 showed intact repair of aortic root. Follows with Dr. Annamaria Boots for chronic bronchitis/asthma, allergic rhinitis.   Today 04/04/22: Wife present during visit. Denies fever chills, He does have a cough and reports he is on antibiotics for bronchitis.   Review of Systems: Review of Systems  All other systems reviewed and are negative.   Past Medical History:  Diagnosis Date   Allergic rhinitis    Anxiety    Arthritis    Asthma    Bladder tumor    Chronic fatigue    COVID-19 05/22/2021   Depression    06/30/17 Pt denies being depressed, reports Effexor is taken for Chronic Fatigue    Dyspnea    Enlarged prostate    Fibromyalgia    GERD (gastroesophageal reflux disease)    Headache    History of chronic bronchitis    History of migraine    History of toxic encephalopathy    Hypothyroidism    OSA on CPAP    CPAP 14   PAF (paroxysmal atrial  fibrillation) ([place]) CARDIOLOGIST -- DR [name]   DX OCT 2013   S/P AVR (aortic valve replacement) and aortoplasty    Sensitiveness to light    Unspecified essential hypertension    Urethral tumor    PROSTATIC    Social History   Tobacco Use   Smoking status: Former    Packs/day: 0.50    Years: 27.00    Total pack years: 13.50    Types: Cigarettes    Start date: 52    Quit date: 10/21/1979    Years since quitting: 42.4   Smokeless tobacco: Never  Vaping Use   Vaping Use: Never used  Substance Use Topics   Alcohol use: Yes    Alcohol/week: 4.0 standard drinks of alcohol    Types: 4 Cans of beer per week    Comment: drinks "as much as I could" - 2-3 beers up to 8-10   Drug use: Never    Family History  Problem Relation Age of Onset   Aortic aneurysm Mother 56       cause of death   Other Father        motor vehicle accident   Heart disease Other        family history    Allergies  Allergen Reactions   Perphenazine Swelling    Tongue swelling   Amiodarone Swelling   Codeine Itching   Tramadol Other (See Comments)    dellusion    Tramadol Hcl     Other reaction(s): hallucinations    Health Maintenance  Topic Date Due   Hepatitis C Screening  Never done   TETANUS/TDAP  Never done   Zoster Vaccines- Shingrix (1 of 2) Never done   COVID-19 Vaccine (6 - Booster for Pfizer series) 09/17/2021   INFLUENZA VACCINE  05/20/2022   Pneumonia Vaccine 41+ Years old (3 - PPSV23 if available, else  PCV20) 07/12/2022   COLONOSCOPY (Pts 45-33yr Insurance coverage will need to be confirmed)  02/07/2031   HPV VACCINES  Aged Out    Objective:  There were no vitals filed for this visit. There is no height or weight on file to calculate BMI.  Physical Exam Constitutional:      General: He is not in acute distress.    Appearance: He is normal weight. He is not toxic-appearing.  HENT:     Head: Normocephalic and atraumatic.     Right Ear: External ear normal.     Left  Ear: External ear normal.     Nose: No congestion or rhinorrhea.     Mouth/Throat:     Mouth: Mucous membranes are moist.     Pharynx: Oropharynx is clear.  Eyes:     Extraocular Movements: Extraocular movements intact.     Conjunctiva/sclera: Conjunctivae normal.     Pupils: Pupils are equal, round, and reactive to light.  Cardiovascular:     Rate and Rhythm: Normal rate and regular rhythm.     Heart sounds: No murmur heard.    No friction rub. No gallop.  Pulmonary:     Effort: Pulmonary effort is normal.     Breath sounds: Normal breath sounds.  Abdominal:     General: Abdomen is flat. Bowel sounds are normal.     Palpations: Abdomen is soft.  Musculoskeletal:        General: No swelling. Normal range of motion.     Cervical back: Normal range of motion and neck supple.  Skin:    General: Skin is warm and dry.  Neurological:     General: No focal deficit present.     Mental Status: He is oriented to person, place, and time.  Psychiatric:        Mood and Affect: Mood normal.     Lab Results Lab Results  Component Value Date   WBC 7.7 01/03/2022   HGB 16.0 01/03/2022   HCT 47.9 01/03/2022   MCV 92.6 01/03/2022   PLT 172 01/03/2022    Lab Results  Component Value Date   CREATININE 0.92 01/03/2022   BUN 12 01/03/2022   NA 140 01/03/2022   K 4.4 01/03/2022   CL 102 01/03/2022   CO2 26 01/03/2022    Lab Results  Component Value Date   ALT 17 01/03/2022   AST 20 01/03/2022   ALKPHOS 35 (L) 10/27/2021   BILITOT 0.6 01/03/2022    Lab Results  Component Value Date   CHOL 185 02/17/2019   HDL 42 02/17/2019   LDLCALC 96 02/17/2019   TRIG 234 (H) 02/17/2019   CHOLHDL 4.4 02/17/2019   No results found for: "LABRPR", "RPRTITER" No results found for: "HIV1RNAQUANT", "HIV1RNAVL", "CD4TABS"   AP #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 , esr 2, crp 1 -Seen by Dr. VDarcey NoraCTS on 6/5 and CTA on 6/5 showed intact repair of root aneurysm. Discussed dental/urological prophylaxis.  -Today he had no fever and chills.Concerned with chronic bronchitis for he follows with Pulmonology, Dr. YAnnamaria Boots Counseled to continue follow up with Pulmonology.  Plan: -Follow-up PRN        MLaurice Record MD [facility] 04/04/2022, 3:11 PM

## 2022-04-09 ENCOUNTER — Encounter: Payer: Self-pay | Admitting: Student

## 2022-04-09 ENCOUNTER — Ambulatory Visit: Payer: Medicare Other | Admitting: Student

## 2022-04-09 VITALS — BP 140/82 | HR 63 | Temp 98.2°F | Ht 70.0 in | Wt 240.0 lb

## 2022-04-09 DIAGNOSIS — R053 Chronic cough: Secondary | ICD-10-CM

## 2022-04-09 MED ORDER — METHYLPREDNISOLONE ACETATE 40 MG/ML INJ SUSP (RADIOLOG: 120.0000 mg | Freq: Once | INTRAMUSCULAR | Status: AC

## 2022-04-09 MED ORDER — MONTELUKAST SODIUM 10 MG PO TABS: 10.0000 mg | ORAL_TABLET | Freq: Every day | ORAL | 11 refills | Status: DC

## 2022-04-09 MED ORDER — METHYLPREDNISOLONE ACETATE 80 MG/ML IJ SUSP
120.0000 mg | Freq: Once | INTRAMUSCULAR | Status: AC
  Administered 2022-04-09: 120 mg via INTRAMUSCULAR

## 2022-04-09 NOTE — Patient Instructions (Addendum)
-   depo medrol today - finish doxycycline course - let us know if you need another round - try singulair, can add nonsedating anthistamine for postnasal drainage if you have to - see you in September or earlier if need be!

## 2022-04-09 NOTE — Progress Notes (Signed)
Synopsis: Referred for acute bronchitis by Shirline Frees, MD  Subjective:   PATIENT ID: Grant [name] GENDER: male DOB: 05/27/1951, MRN: [id number]  Chief Complaint  Patient presents with   Acute Visit    Increased cough over the past month. Cough is occ prod with green sputum. He has occ wheezing. Voice has been hoarse. He has had some PND past 2 wks. Had nosebleed a day ago.  Tx with zpack recently and currently on doxy.    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 Antibiotics do seem to help a little bit. Coughing stuff up sometimes. No fever. Chest feels tight but does not hurt. He does have some sinus congestion over last few days - using astelin. Takes claritin in AM. Despite this he has significant postnasal drainage.   Takes symbicort 160 2 puffs BID with no spacer. Rinses mouth after using it.   Otherwise pertinent review of systems is negative.  Past Medical History:  Diagnosis Date   Allergic rhinitis    Anxiety    Arthritis    Asthma    Bladder tumor    Chronic fatigue    COVID-19 05/22/2021   Depression    06/30/17 Pt denies being depressed, reports Effexor is taken for Chronic Fatigue    Dyspnea    Enlarged prostate    Fibromyalgia    GERD (gastroesophageal reflux disease)    Headache    History of chronic bronchitis    History of migraine    History of toxic encephalopathy    Hypothyroidism    OSA on CPAP    CPAP 14   PAF (paroxysmal atrial fibrillation) (HCC) CARDIOLOGIST -- DR [name]   DX OCT 2013   S/P AVR (aortic valve replacement) and aortoplasty    Sensitiveness to light    Unspecified essential hypertension    Urethral tumor    PROSTATIC      Family History  Problem Relation Age of Onset   Aortic aneurysm Mother 73       cause of death   Other Father        motor vehicle accident   Heart disease Other        family history     Past Surgical History:  Procedure Laterality Date   BENTALL PROCEDURE N/A 07/02/2017   Procedure: BENTALL PROCEDURE;  Surgeon: Ivin Poot, MD;  Location: [place];  Service: Open Heart Surgery;  Laterality: N/A;  WITH CIRC ARREST   BIOPSY  02/06/2021   Procedure: BIOPSY;  Surgeon: Clarene Essex, MD;  Location: [facility];  Service: Endoscopy;;   BUBBLE STUDY  11/01/2021   Procedure: BUBBLE STUDY;  Surgeon: Geralynn Rile, MD;  Location: [place];  Service: Cardiovascular;;   CARDIOVERSION N/A 11/28/2021   Procedure: CARDIOVERSION (CATH LAB);  Surgeon: Vickie Epley, MD;  Location: [place] [facility];  Service: Cardiovascular;  Laterality: N/A;   COLONOSCOPY     COLONOSCOPY WITH PROPOFOL N/A 02/06/2021   Procedure: COLONOSCOPY WITH PROPOFOL;  Surgeon: Clarene Essex, MD;  Location: [facility];  Service: Endoscopy;  Laterality: N/A;   CYSTOSCOPY W/ RETROGRADES Bilateral 06/15/2013   Procedure: CYSTOSCOPY WITH BILATERAL RETROGRADE PYELOGRAM  BLADDER BIOPSY, PROSTATIC URETHRAL BIOPSY, ;  Surgeon: Molli Hazard, MD;  Location[facility];  Service: Urology;  Laterality: Bilateral;   ESOPHAGOGASTRODUODENOSCOPY (EGD) WITH PROPOFOL N/A 03/11/2018   Procedure: ESOPHAGOGASTRODUODENOSCOPY (EGD) WITH PROPOFOL;  Surgeon: Clarene Essex, MD;  Location: [place];  Service: Endoscopy;  Laterality: N/A;   LAPAROSCOPIC CHOLECYSTECTOMY  01/14/2001   PROCTOSCOPY N/A 06/12/2021   Procedure: RIGID PROCTOSCOPY;  Surgeon: [name], MD;  Location: [facility];  Service: General;  Laterality: N/A;   RIGHT/LEFT HEART CATH AND CORONARY ANGIOGRAPHY N/A 06/12/2017   Procedure: RIGHT/LEFT HEART CATH AND CORONARY ANGIOGRAPHY;  Surgeon: Larey Dresser, MD;  Location: [place] [facility];   Service: Cardiovascular;  Laterality: N/A;   TEE WITHOUT CARDIOVERSION N/A 07/02/2017   Procedure: TRANSESOPHAGEAL ECHOCARDIOGRAM (TEE);  Surgeon: Prescott Gum, Collier Salina, MD;  Location: [place];  Service: Open Heart Surgery;  Laterality: N/A;   TEE WITHOUT CARDIOVERSION N/A 11/01/2021   Procedure: TRANSESOPHAGEAL ECHOCARDIOGRAM (TEE);  Surgeon: Geralynn Rile, MD;  Location: [place];  Service: Cardiovascular;  Laterality: N/A;   UMBILICAL HERNIA REPAIR  01/14/2008    Social History   Socioeconomic History   Marital status: Married    Spouse name: Not on file   Number of children: Not on file   Years of education: Not on file   Highest education level: Not on file  Occupational History   Occupation: disabled    Comment: owner of buisness  Tobacco Use   Smoking status: Former    Packs/day: 0.50    Years: 27.00    Total pack years: 13.50    Types: Cigarettes    Start date: 35    Quit date: 10/21/1979    Years since quitting: 42.4   Smokeless tobacco: Never  Vaping Use   Vaping Use: Never used  Substance and Sexual Activity   Alcohol use: Yes    Alcohol/week: 4.0 standard drinks of alcohol    Types: 4 Cans of beer per week    Comment: drinks "as much as I could" - 2-3 beers up to 8-10   Drug use: Never   Sexual activity: Not on file  Other Topics Concern   Not on file  Social History Narrative   Right handed   One story home   Drinks no caffeine   Social Determinants of Health   Financial Resource Strain: Unknown (11/22/2021)   Overall Financial Resource Strain (CARDIA)    Difficulty of Paying Living Expenses: Patient refused  Food Insecurity: No Food Insecurity (11/22/2021)   Hunger Vital Sign    Worried About Running Out of Food in the Last Year: Never true    Ran Out of Food in the Last Year: Never true  Transportation Needs: No Transportation Needs (11/22/2021)   PRAPARE - Hydrologist (Medical): No    Lack of Transportation  (Non-Medical): No  Physical Activity: Unknown (11/22/2021)   Exercise Vital Sign    Days of Exercise per Week: 4 days    Minutes of Exercise per Session: Not on file  Stress: Unknown (11/22/2021)   [place]    Feeling of Stress : Patient refused  Social Connections: Unknown (11/22/2021)   Social Connection and Isolation Panel [NHANES]    Frequency of Communication with Friends and Family: More than three times a week    Frequency of Social Gatherings with Friends and Family: Once a week    Attends Religious Services: Patient refused    Active Member  of Clubs or Organizations: No    Attends Music therapist: Not on file    Marital Status: Married  Human resources officer Violence: Not on file     Allergies  Allergen Reactions   Perphenazine Swelling    Tongue swelling   Amiodarone Swelling   Codeine Itching   Prednisone     CAN TOLERATE DEPO. PREDNISONE BY MOUTH CAUSES MOOD CHANGES AND ITCHING.    Tramadol Other (See Comments)    dellusion    Tramadol Hcl     Other reaction(s): hallucinations     Outpatient Medications Prior to Visit  Medication Sig Dispense Refill   albuterol (VENTOLIN HFA) 108 (90 Base) MCG/ACT inhaler INHALE 2 PUFFS INTO THE LUNGS EVERY 6 HOURS AS NEEDED FOR WHEEZING OR SHORTNESS OF BREATH (Patient taking differently: 2 puffs every 6 (six) hours as needed for shortness of breath.) 18 g 5   ALPRAZolam (XANAX) 0.25 MG tablet Take 1 tablet (0.25 mg total) by mouth 3 (three) times daily as needed for anxiety. 15 tablet 0   aluminum hydroxide-magnesium carbonate (GAVISCON) 95-358 MG/15ML SUSP Take 15 mLs by mouth 3 (three) times daily as needed for indigestion or heartburn.     azelastine (ASTELIN) 137 MCG/SPRAY nasal spray Place 1-2 sprays into the nose 2 (two) times daily as needed for rhinitis or allergies.      benzonatate (TESSALON) 200 MG capsule Take 1 capsule (200 mg total) by mouth 3  (three) times daily as needed for cough. 60 capsule 3   cetirizine (ZYRTEC) 10 MG tablet Take 10 mg by mouth daily.     COLCRYS 0.6 MG tablet Take 0.6 mg by mouth in the morning.     Dextromethorphan HBr (DELSYM PO) Take by mouth as directed.     diclofenac Sodium (VOLTAREN) 1 % GEL Apply 2 g topically 3 (three) times daily as needed (arthritis pain.).     diphenhydrAMINE (BENADRYL) 25 mg capsule Take 50 mg by mouth daily as needed (allergies.).     doxycycline (VIBRA-TABS) 100 MG tablet Take 100 mg by mouth 2 (two) times daily.     EPINEPHrine 0.3 mg/0.3 mL IJ SOAJ injection Inject 0.3 mLs (0.3 mg total) into the muscle as needed (for angioedema). 2 Device 0   Erenumab-aooe (AIMOVIG) 140 MG/ML SOAJ Inject 140 mg into the skin every 28 (twenty-eight) days. 1 mL 11   Ferrous Sulfate (IRON) 142 (45 Fe) MG TBCR Take 45 mg by mouth in the morning and at bedtime. Only one tablet daily for the next 7 days (03/05/22)     finasteride (PROSCAR) 5 MG tablet Take 5 mg by mouth at bedtime.      guaiFENesin (MUCINEX) 600 MG 12 hr tablet Take 600 mg by mouth 2 (two) times daily as needed for to loosen phlegm or cough.     levothyroxine (SYNTHROID, LEVOTHROID) 112 MCG tablet Take 112 mcg by mouth daily before breakfast.      liothyronine (CYTOMEL) 25 MCG tablet Take 25 mcg by mouth daily before breakfast.     losartan (COZAAR) 50 MG tablet TAKE 1 TABLET BY MOUTH EVERY DAY 30 tablet 6   Menthol, Topical Analgesic, (BLUE-EMU MAXIMUM STRENGTH EX) Apply 1 application topically 3 (three) times daily as needed (arthritis pain.).     Methylcellulose, Laxative, (FIBER THERAPY) 500 MG TABS Take 1,000 mg by mouth in the morning.     metoprolol tartrate (LOPRESSOR) 50 MG tablet Take 1 tablet (50 mg total) by mouth 2 (two) times daily.  180 tablet 3   Multiple Vitamin (MULTIVITAMIN WITH MINERALS) TABS tablet Take 1 tablet by mouth in the morning.     NON FORMULARY Pt uses a cpap nightly     olopatadine (PATANOL) 0.1 %  ophthalmic solution Place 1 drop into both eyes 2 (two) times daily as needed for allergies.     omeprazole (PRILOSEC) 40 MG capsule Take 1 capsule (40 mg total) by mouth 2 (two) times daily. 180 capsule 1   rivaroxaban (XARELTO) 20 MG TABS tablet TAKE 1 TABLET(20 MG) BY MOUTH DAILY WITH SUPPER 90 tablet 1   rizatriptan (MAXALT) 10 MG tablet Take 1 tablet (10 mg total) by mouth as needed for migraine. May repeat in 2 hours if needed 30 tablet 3   saccharomyces boulardii (FLORASTOR) 250 MG capsule Take 250 mg by mouth daily as needed (when taking antibiotics).     sodium chloride (OCEAN) 0.65 % SOLN nasal spray Place 1 spray into both nostrils as needed for congestion.     SYMBICORT 160-4.5 MCG/ACT inhaler INHALE 2 PUFFS INTO THE LUNGS IN THE MORNING AND AT BEDTIME 10.2 g 1   tamsulosin (FLOMAX) 0.4 MG CAPS capsule Take 0.4 mg by mouth in the morning and at bedtime.     Testosterone Cypionate 200 MG/ML SOLN Inject 100 mg into the muscle. Every 7-10 days     venlafaxine (EFFEXOR-XR) 150 MG 24 hr capsule Take 150 mg by mouth at bedtime.     zolpidem (AMBIEN) 10 MG tablet Take 10 mg by mouth at bedtime as needed for sleep.     azithromycin (ZITHROMAX) 250 MG tablet Take by mouth daily.     No facility-administered medications prior to visit.       Objective:   Physical Exam:  General appearance: 71 y.o., male, NAD, conversant  Eyes: anicteric sclerae; PERRL, tracking appropriately HENT: NCAT; MMM Neck: Trachea midline; no lymphadenopathy, no JVD Lungs: CTAB, no crackles, no wheeze, with normal respiratory effort CV: RRR, no murmur  Abdomen: Soft, non-tender; non-distended, BS present  Extremities: No peripheral edema, warm Skin: Normal turgor and texture; no rash Psych: Appropriate affect Neuro: Alert and oriented to person and place, no focal deficit     Vitals:   04/09/22 1359  BP: 140/82  Pulse: 63  Temp: 98.2 F (36.8 C)  TempSrc: Oral  SpO2: 98%  Weight: 240 lb (108.9  kg)  Height: '5\' 10"'$  (1.778 m)   98% on RA BMI Readings from Last 3 Encounters:  04/09/22 34.44 kg/m  04/04/22 34.58 kg/m  03/24/22 32.28 kg/m   Wt Readings from Last 3 Encounters:  04/09/22 240 lb (108.9 kg)  04/04/22 241 lb (109.3 kg)  03/24/22 225 lb (102.1 kg)     CBC    Component Value Date/Time   WBC 7.7 01/03/2022 1109   RBC 5.17 01/03/2022 1109   HGB 16.0 01/03/2022 1109   HGB 12.1 (L) 07/27/2017 1158   HCT 47.9 01/03/2022 1109   HCT 36.3 (L) 07/27/2017 1158   PLT 172 01/03/2022 1109   PLT 287 07/27/2017 1158   MCV 92.6 01/03/2022 1109   MCV 86 07/27/2017 1158   MCH 30.9 01/03/2022 1109   MCHC 33.4 01/03/2022 1109   RDW 12.8 01/03/2022 1109   RDW 14.4 07/27/2017 1158   LYMPHSABS 1,178 01/03/2022 1109   MONOABS 0.1 10/26/2021 0440   EOSABS 185 01/03/2022 1109   BASOSABS 54 01/03/2022 1109    Chest Imaging: CTA Chest 03/24/22 with scattered subtle ggo greatest in lingula  Pulmonary Functions Testing Results:    Latest Ref Rng & Units 06/01/2017    9:54 AM  PFT Results  FVC-Pre L 4.31   FVC-Predicted Pre % 92   FVC-Post L 4.46   FVC-Predicted Post % 96   Pre FEV1/FVC % % 81   Post FEV1/FCV % % 83   FEV1-Pre L 3.49   FEV1-Predicted Pre % 100   FEV1-Post L 3.71   DLCO uncorrected ml/min/mmHg 32.71   DLCO UNC% % 101   DLVA Predicted % 98   TLC L 7.52   TLC % Predicted % 107   RV % Predicted % 124    Reviewed - normal in 2018      Assessment & Plan:   # Subacute on chronic cough With scattered subtle ggo on CT Chest wonder if he's had viral LRTI with post viral cough. Also has active postnasal drainage despite use of zyrtec, astelin. Doesn't tolerate flonase due to nosebleeds.  # Hoarseness Probably from coughing so much. He has no evidence of thrush on my exam.   Plan: - he really thinks this will turn around with depo medrol shot, I'm fine with giving it a go - mucinex prn - continue symbicort 160 2 puffs BID rinse mouth after use -  flutter valve 10 slow but firm puffs twice daily after each symbicort treatment - trial singulair 10 mg daily    RTC September with Dr. Jerelene Redden, MD [facility] 04/09/2022 2:18 PM

## 2022-04-09 NOTE — Addendum Note (Signed)
Addended by: Rosana Berger on: 04/09/2022 02:37 PM   Modules accepted: Orders

## 2022-04-10 DIAGNOSIS — G4733 Obstructive sleep apnea (adult) (pediatric): Secondary | ICD-10-CM | POA: Diagnosis not present

## 2022-04-14 ENCOUNTER — Telehealth (HOSPITAL_COMMUNITY): Payer: Self-pay | Admitting: Pharmacy Technician

## 2022-04-14 ENCOUNTER — Encounter: Payer: Self-pay | Admitting: Student

## 2022-04-14 NOTE — Telephone Encounter (Signed)
Patient Advocate Encounter  Prior Authorization for Aimovig 140MG /ML auto-injectors  has been approved.     Effective dates: 04/14/2022 through 04/15/2023      Roland Earl, CPhT Pharmacy Patient Advocate Specialist [facility] Patient Advocate Team Direct Number: [phone]  Fax: [phone]

## 2022-04-14 NOTE — Telephone Encounter (Signed)
Patient Advocate Encounter   Received notification that prior authorization for Aimovig 140MG /ML auto-injectors is required.   PA submitted on 06/826/2023 Key [id number] Status is pending       Roland Earl, CPhT Pharmacy Patient Advocate Specialist [facility] Patient Advocate Team Direct Number: [phone]  Fax: [phone]

## 2022-04-15 ENCOUNTER — Encounter: Payer: Self-pay | Admitting: Internal Medicine

## 2022-04-15 NOTE — Telephone Encounter (Signed)
Patient has been scheduled to see Dr. Maple Hudson Thursday at 11:30. Nothing further needed at this time.

## 2022-04-16 NOTE — Progress Notes (Signed)
History of Present Illness: . male former smoker with asthma/chronic bronchitis and allergic rhinitis, OSA PAF/ anticoagulation, aortic valve replacement, toxic encephalopathy, light sensitivity, chronic fatigue/fibromyalgia NPSG 03/24/03- AHI 88/ hr, desaturation to 75%, body weight 244 lbs PFT 06/01/17-WNL-FVC 4.46/96%, FEV1 3.71/106%, ratio 0.83, FEF 25-75% 4.64/167%, no response to dilator, TLC 107%, DLCO 101%  ------------------------------------------------------------------  03/04/22-71 year old male former smoker followed for asthma/chronic bronchitis and allergic rhinitis,  OSA,  complicated by PAF/ anticoagulation, Aortic Valve Replacement, Toxic Encephalopathy, Light sensitivity, Chronic Fatigue/fibromyalgia CPAP 12/Advanced -----Patient and his wife state that he's been having congestion and throat feels "constricted" sats have been up and down. Some increased shortness of breath with exertion. Would like new cpap machine Patient had worked with Dr. Melvyn Novas for a few years and recently switched back to me.  Wife is with him and provides much of the history.  Hospitalized in January for sepsis associated with possibly a bad tooth, on prolonged antibiotic until March.  Now complains of intermittent cough, sometimes green sputum since March.  Feels "constricted in my upper lung", pointing to his throat and upper tracheal area.  He associates this with frequent need to cough and occasional difficulty swallowing.  I asked if it could be a tic and wife thought that the sounded appropriate since he has other tics and twitches. She "chases him" with sips of water and [employer] candies, Mucinex and Tessalon. Chest x-ray was clear at [facility] April 11 by report. Pending follow-up with cardiology and thoracic surgery.  04/17/22- 71 year old male former smoker followed for asthma/chronic bronchitis and allergic rhinitis,  OSA,  complicated by PAF/ anticoagulation, Aortic Valve Replacement,/ IE abscess,  Toxic Encephalopathy, Light sensitivity, Chronic Fatigue/fibromyalgia -Flutter valve, Symbicort 160, Astelin nasal, tessalon, singulair, Ventolin hfa,  CPAP 12/Advanced Download compliance 64%, AHI 2.7/ hr OV Dr Verlee Monte 04/09/22- incr cough x 1 month. Recent Zpak, Doxy.> depomedrol, mucinex Wife brings him back again today with similar complaint of cough which he demonstrates with a rather exaggerated, upper airway raspiness.  Has had 2 rounds of Zithromax, 2 rounds of doxycycline since May.  Says sputum remains green.  They say Depo-Medrol helps him the most.  Expresses intolerance to oral steroids because of mood changes and agitation.  Singulair said to cause "crazy dreams". Blood cultures 02/25/22- neg CTaAortaChest 03/24/22- NON-VASCULAR 1. Scattered subtle left lung ground-glass pulmonary opacities as could be seen with resolving or developing multifocal pneumonia. 2. Unchanged, uncomplicated appearance of distal colo-colonic anastomosis. 3. Prostatomegaly. 4. Chronic nonunion median sternotomy.   ROS-see HPI     + = pos Constitutional:   No-   weight loss, night sweats, fevers, chills, fatigue, lassitude. HEENT:   No-  headaches, difficulty swallowing, tooth/dental problems,  sore throat,       No-  sneezing, itching, ear ache,                    No-nasal congestion, + post nasal drip,  CV:  No-   chest pain, orthopnea, PND, swelling in lower extremities, anasarca,  dizziness, palpitations Resp: No- coughing up of blood.              + change in color of mucus.  No- wheezing.   Skin: Clear GI:  + heartburn, indigestion, abdominal pain, nausea, vomiting,  GU:  MS:  No-   joint pain or swelling.   Neuro-     nothing unusual Psych:  No- change in mood or affect. + depression or anxiety.  No memory loss.  OBJ- Physical Exam General- Alert, Oriented, Affect-appropriate, Distress- none acute.  Skin- +eczematoid patches on lower calves, nonspecific, excoriated Lymphadenopathy- none Head-  atraumatic. Stares aimlessly around room like a blind person.             Eyes- Gross vision intact, PERRLA, conjunctivae and secretions clear,  + sunglasses            Ears- Hearing, canals-normal            Nose- Clear, no-Septal dev, mucus, polyps, erosion, perforation             Throat- Mallampati II-III, mucosa , drainage- none, tonsils- atrophic Neck- flexible , trachea midline, no stridor , thyroid nl, carotid no bruit Chest - symmetrical excursion , unlabored           Heart/CV- RRR ,  murmur+1-2/6 , no gallop  , no rub, nl s1 s2,                            - JVD- none , edema- none, stasis changes- none, varices- none           Lung- + clear/ unlabored, wheeze- none, cough+ repetitive dry upper airway, dullness-none, rub- none           Chest wall-  Abd-  Br/ Gen/ Rectal- Not done, not indicated Extrem- cyanosis- none, clubbing, none, atrophy- none, strength- nl Neuro- grossly intact to observation

## 2022-04-17 ENCOUNTER — Encounter: Payer: Self-pay | Admitting: Internal Medicine

## 2022-04-17 ENCOUNTER — Other Ambulatory Visit (INDEPENDENT_AMBULATORY_CARE_PROVIDER_SITE_OTHER): Payer: Medicare Other

## 2022-04-17 ENCOUNTER — Ambulatory Visit: Payer: Medicare Other | Admitting: Internal Medicine

## 2022-04-17 VITALS — BP 122/60 | HR 79 | Temp 97.9°F | Ht 70.0 in | Wt 238.4 lb

## 2022-04-17 DIAGNOSIS — J45909 Unspecified asthma, uncomplicated: Secondary | ICD-10-CM | POA: Diagnosis not present

## 2022-04-17 DIAGNOSIS — J42 Unspecified chronic bronchitis: Secondary | ICD-10-CM

## 2022-04-17 DIAGNOSIS — G4733 Obstructive sleep apnea (adult) (pediatric): Secondary | ICD-10-CM | POA: Diagnosis not present

## 2022-04-17 LAB — CBC WITH DIFFERENTIAL/PLATELET
Basophils Absolute: 0 10*3/uL (ref 0.0–0.1)
Basophils Relative: 0.4 % (ref 0.0–3.0)
Eosinophils Absolute: 0.4 10*3/uL (ref 0.0–0.7)
Eosinophils Relative: 3.4 % (ref 0.0–5.0)
HCT: 47.3 % (ref 39.0–52.0)
Hemoglobin: 16 g/dL (ref 13.0–17.0)
Lymphocytes Relative: 9.6 % — ABNORMAL LOW (ref 12.0–46.0)
Lymphs Abs: 1 10*3/uL (ref 0.7–4.0)
MCHC: 33.8 g/dL (ref 30.0–36.0)
MCV: 95.8 fl (ref 78.0–100.0)
Monocytes Absolute: 1.3 10*3/uL — ABNORMAL HIGH (ref 0.1–1.0)
Monocytes Relative: 12.8 % — ABNORMAL HIGH (ref 3.0–12.0)
Neutro Abs: 7.6 10*3/uL (ref 1.4–7.7)
Neutrophils Relative %: 73.8 % (ref 43.0–77.0)
Platelets: 179 10*3/uL (ref 150.0–400.0)
RBC: 4.94 Mil/uL (ref 4.22–5.81)
RDW: 14.3 % (ref 11.5–15.5)
WBC: 10.4 10*3/uL (ref 4.0–10.5)

## 2022-04-17 MED ORDER — AMOXICILLIN-POT CLAVULANATE 875-125 MG PO TABS: 1.0000 | ORAL_TABLET | Freq: Two times a day (BID) | ORAL | 0 refills | Status: DC

## 2022-04-17 MED ORDER — METHYLPREDNISOLONE ACETATE 80 MG/ML IJ SUSP
120.0000 mg | Freq: Once | INTRAMUSCULAR | Status: AC
  Administered 2022-04-17: 120 mg via INTRAMUSCULAR

## 2022-04-17 NOTE — Patient Instructions (Addendum)
Order- Sputum culture and sensitivity - routine, fungal and AFB   dx chronic bronchitis  Order- lab- respiratory viral panel, Covid assay    dx chronic bronchitis Order- lab- CBC w diff,    Order- Depo 120      dx chronic bronchitis  Script sent for augmentin antibiotic   Keep September appointment

## 2022-04-18 ENCOUNTER — Encounter: Payer: Self-pay | Admitting: Internal Medicine

## 2022-04-18 NOTE — Telephone Encounter (Signed)
No evidence of sepsis. This does not rule out infection with a virus, but antibiotics won't help that anyway.  Still helpful if he can produce a dep sputum specimen for culture- not spit. Often bet results are early in the morning, once he is up and moving around. The cortisone shot will work gradually over the next few days. His white blood count was within the upper limit of normal for this lab. Recent steroid therapy can raise the white blood count, so this may just be a residual of his previous treatment.  I will be away next week. One of our Nurse Practitioners may be able to see him if needed.

## 2022-04-18 NOTE — Telephone Encounter (Signed)
Received the following message from patient's wife:   "Good Afternoon Dr. Annamaria Boots,    Thank you for seeing [place] yesterday. Could you help me understand CBC results..what they indicate and what we need to do next if anything. I see WBC is borderline high. Of course w the weekend i dont know.  Mort was unable to do sputum this a.m. so i guess will try again Mon.  No labs for resp. viral panel or Covid assay scheduled. Grant had episode yesterday afternoon when he became disoriented and his oxyen had dropped to 79-82 and heartrate bounced off 39 but was in 40's and 50's. I put him on his cpap and it regulated at HR 61-62 and Oxygen 92-98 after about 2 hours then he was able to be off of it until bedtime. Today he feels pretty bad but coughing hasnt been quite as bad and he doesnt seem disoriented but very tired. He is major cranky (probably from shot). Frequently says he feels rotten. I dont think he has any fever. I am unable to get Oxygen and Hr yet but will send if unusual. Thank you again, [name] [phone]"  I send her Dr. Janee Morn interpretation of the lab work.   Dr. Annamaria Boots, can you please advise? Thanks!

## 2022-04-20 DIAGNOSIS — G4733 Obstructive sleep apnea (adult) (pediatric): Secondary | ICD-10-CM | POA: Diagnosis not present

## 2022-04-21 ENCOUNTER — Telehealth: Payer: Self-pay | Admitting: Internal Medicine

## 2022-04-21 NOTE — Telephone Encounter (Signed)
Pt seen on 6/30. States pt had an episode on Thursday where his pulse ox went to low 70s and heart rate went down to the 30-50 range. Pt was confused and out of sorts. Wife put him on CPAP for a few hours and that seemed to settle him back to normal.  States this same thing happened again on Saturday and Sunday. Denies fever. States the antibiotics have seemed to start working since then. Is aware that CY is out but wants to know if there are any recommendations. States pt is feeling better as of this morning, but just is concerned in general. Please advise.

## 2022-04-21 NOTE — Telephone Encounter (Signed)
Will need office visit for further evaluation, if unable to get on schedule will need to go to urgent care or emergency room.  Please make sure that he is wearing his CPAP consistently.  Last office notes indicate his compliance was decreased.  I do not show that he is on oxygen at home if he is dropping his oxygen levels into the 70s.  Needs to go to the emergency room for evaluation  Please contact office for sooner follow up if symptoms do not improve or worsen or seek emergency care

## 2022-04-21 NOTE — Telephone Encounter (Signed)
Called and spoke with pt's spouse Juliann Pulse letting her know the info per TP and she verbalized understanding. Nothing further needed.

## 2022-04-21 NOTE — Telephone Encounter (Signed)
Called and spoke with patient's wife Juliann Pulse who states that Pt was seen on 6/30. States pt had an episode on Thursday where his pulse ox went to low 70s and heart rate went down to the 30-50 range. Pt was confused and out of sorts. Wife put him on CPAP a couple times throughout the weekend for a few hours and that seemed to settle him back to normal.  States this same thing happened again on Saturday and Sunday. Wife states that he is coughing a lot but denies fever. States the antibiotics have seemed to start working since then. Is aware that CY is out but wants to know if there are any recommendations. States pt is feeling better as of this morning, but just is concerned in general.    Please advise

## 2022-04-28 ENCOUNTER — Ambulatory Visit (INDEPENDENT_AMBULATORY_CARE_PROVIDER_SITE_OTHER): Payer: Medicare Other

## 2022-04-28 ENCOUNTER — Encounter: Payer: Self-pay | Admitting: Internal Medicine

## 2022-04-28 ENCOUNTER — Ambulatory Visit: Payer: Medicare Other | Admitting: Internal Medicine

## 2022-04-28 VITALS — BP 124/80 | HR 86 | Ht 70.0 in | Wt 240.6 lb

## 2022-04-28 DIAGNOSIS — G4733 Obstructive sleep apnea (adult) (pediatric): Secondary | ICD-10-CM

## 2022-04-28 DIAGNOSIS — J42 Unspecified chronic bronchitis: Secondary | ICD-10-CM | POA: Diagnosis not present

## 2022-04-28 DIAGNOSIS — Z952 Presence of prosthetic heart valve: Secondary | ICD-10-CM | POA: Diagnosis not present

## 2022-04-28 MED ORDER — METHYLPREDNISOLONE 4 MG PO TABS: 4.0000 mg | ORAL_TABLET | Freq: Every day | ORAL | 0 refills | Status: DC

## 2022-04-28 MED ORDER — HYDROCOD POLI-CHLORPHE POLI ER 10-8 MG/5ML PO SUER
5.0000 mL | Freq: Two times a day (BID) | ORAL | 0 refills | Status: DC | PRN
Start: 2022-04-28 — End: 2022-06-11

## 2022-04-28 MED ORDER — BREZTRI AEROSPHERE 160-9-4.8 MCG/ACT IN AERO: 2.0000 | INHALATION_SPRAY | Freq: Two times a day (BID) | RESPIRATORY_TRACT | 0 refills | Status: DC

## 2022-04-28 NOTE — Assessment & Plan Note (Signed)
Sustained bronchitic exacerbation with unusual affect making assessment more difficult. Plan-we discussed history of pruritus with codeine, mental agitation with higher dose steroids.  We are going to try samples of Breztri, Tussionex, extra antihistamine if needed, low-dose Medrol, CXR

## 2022-04-28 NOTE — Assessment & Plan Note (Signed)
Benefits from CPAP with adequate compliance and control Plan-continue auto 5-15.  Consider changing range to 5-20.

## 2022-04-28 NOTE — Patient Instructions (Addendum)
Script sent for Tussionex cough syrup  If necessary- ok to add an extra Zyrtec to help with itching  Script sent to try medrol 4 mg, 1 daily as antiinflammatory  Sample x 2 Breztri inhaler   inhale 2 puffs then rinse mouth, twice daily   Try this instead of Symbicort  Order- CXR    dx Chronic bronchitis exacerbation

## 2022-04-28 NOTE — Progress Notes (Signed)
History of Present Illness: . male former smoker with asthma/chronic bronchitis and allergic rhinitis, OSA PAF/ anticoagulation, aortic valve replacement, toxic encephalopathy, light sensitivity, chronic fatigue/fibromyalgia NPSG 03/24/03- AHI 88/ hr, desaturation to 75%, body weight 244 lbs PFT 06/01/17-WNL-FVC 4.46/96%, FEV1 3.71/106%, ratio 0.83, FEF 25-75% 4.64/167%, no response to dilator, TLC 107%, DLCO 101%  ------------------------------------------------------------------   04/17/22- 71 year old male former smoker followed for asthma/chronic bronchitis and allergic rhinitis,  OSA,  complicated by PAF/ anticoagulation, Aortic Valve Replacement,/ IE abscess, Toxic Encephalopathy, Light sensitivity, Chronic Fatigue/fibromyalgia -Flutter valve, Symbicort 160, Astelin nasal, tessalon, singulair, Ventolin hfa,  CPAP 12/Advanced Download compliance 64%, AHI 2.7/ hr OV Dr Verlee Monte 04/09/22- incr cough x 1 month. Recent Zpak, Doxy.> depomedrol, mucinex Blood cultures 02/25/22- neg CTaAortaChest 03/24/22- NON-VASCULAR 1. Scattered subtle left lung ground-glass pulmonary opacities as could be seen with resolving or developing multifocal pneumonia. 2. Unchanged, uncomplicated appearance of distal colo-colonic anastomosis. 3. Prostatomegaly. 4. Chronic nonunion median sternotomy.  04/28/22-  71 year old male former smoker followed for Asthma/chronic Bronchitis and allergic Rhinitis,  OSA,  complicated by PAF/ anticoagulation, Aortic Valve Replacement,/ IE abscess, Toxic Encephalopathy, Light sensitivity, Chronic Fatigue/fibromyalgia -Flutter valve, Symbicort 160, Astelin nasal, tessalon, singulair, Ventolin hfa,  CPAP auto 5-15/Adapt   AirSense 10 AutoSet Download compliance-77%, AHI 5.7/ hr Body weight today- On 7/3 wife reported episode with low O2 sat 70's and HR 30-50 range. Pending PFT CBC 6/29- lymphs 9.6, monos 12.8 H He and his wife report bronchitis somewhat better.  Cough now only productive  scant clear sputum, no longer green.  He had a few days of significant improvement after Depo injection and antibiotic but has been on antibiotics repeatedly over the last couple of months with no lasting effect.  Currently on Mucinex and Tessalon.  Sleeping with head of bed elevated at on pillows.  He minimizes symptoms of reflux. On the afternoon of his last office visit here, with episode of hard coughing his oxygen saturation and heart rate both dropped temporarily and wife thought he seemed confused.  She put him on CPAP for most of the day.  This happened 1 other time.  By description it sounds like near tussive syncope.  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   ROS-see HPI     + = positive Constitutional:   No-   weight loss, night sweats, fevers, chills, fatigue, lassitude. HEENT:   No-  headaches, difficulty swallowing, tooth/dental problems,  sore throat,       No-  sneezing, itching, ear ache,                    No-nasal congestion, + post nasal drip,  CV:  +chest pain, orthopnea, PND, swelling in lower extremities, anasarca,  dizziness, palpitations Resp: No- coughing up of blood.               change in color of mucus.  No- wheezing.   Skin: Clear GI:   heartburn, indigestion, abdominal pain, nausea, vomiting,  GU:  MS:  No-   joint pain or swelling.   Neuro-     nothing unusual Psych:  No- change in mood or affect. + depression or anxiety.  No memory loss.  OBJ- Physical Exam Sunglasses, fidgeting/ restless General- Alert, Oriented, Affect-appropriate, Distress- none acute.  Skin- +eczematoid patches on lower calves, nonspecific, excoriated Lymphadenopathy- none Head- atraumatic. Stares aimlessly around room like a blind person.  Eyes- Gross vision intact, PERRLA, conjunctivae and secretions clear,  + sunglasses            Ears- Hearing, canals-normal            Nose- Clear,  no-Septal dev, mucus, polyps, erosion, perforation             Throat- Mallampati II-III, mucosa , drainage- none, tonsils- atrophic Neck- flexible , trachea midline, no stridor , thyroid nl, carotid no bruit Chest - symmetrical excursion , unlabored           Heart/CV- RRR ,  murmur+2-3/6 , no gallop  , no rub, nl s1 s2,                            - JVD- none , edema- none, stasis changes- none, varices- none           Lung- + clear/ unlabored, wheeze- none, cough+"bronchitic", dullness-none, rub- none           Chest wall-  Abd-  Br/ Gen/ Rectal- Not done, not indicated Extrem- cyanosis- none, clubbing, none, atrophy- none, strength- nl Neuro- grossly intact to observation

## 2022-05-01 ENCOUNTER — Encounter: Payer: Self-pay | Admitting: Internal Medicine

## 2022-05-02 NOTE — Telephone Encounter (Signed)
Recommend dc steroid pills. Wait through the weekend for this to clear. See how he is doing first of week.

## 2022-05-02 NOTE — Telephone Encounter (Signed)
Dr. Annamaria Boots, please advise on pt email regarding prednisone. Thanks.

## 2022-05-06 NOTE — Telephone Encounter (Signed)
Our office requires a visit with a provider for injection. We can work him in with me (ok to use held spot) or an APP anticipating Depomedrol 120 mg inj for exacerbation of chronic bronchitis.

## 2022-05-10 DIAGNOSIS — G4733 Obstructive sleep apnea (adult) (pediatric): Secondary | ICD-10-CM | POA: Diagnosis not present

## 2022-05-12 ENCOUNTER — Other Ambulatory Visit: Payer: Self-pay | Admitting: Cardiovascular Disease

## 2022-05-12 NOTE — Telephone Encounter (Signed)
Prescription refill request for Xarelto received.  Indication:Afib Last office visit:5/23 Weight:109.1 kg Age:71 Scr:0.9 CrCl:119.54 ml/min  Prescription refilled

## 2022-05-14 ENCOUNTER — Encounter: Payer: Self-pay | Admitting: Adult Health

## 2022-05-14 ENCOUNTER — Ambulatory Visit: Payer: Medicare Other | Admitting: Adult Health

## 2022-05-14 VITALS — BP 148/78 | HR 70 | Temp 98.1°F | Ht 70.0 in | Wt 239.0 lb

## 2022-05-14 DIAGNOSIS — G4733 Obstructive sleep apnea (adult) (pediatric): Secondary | ICD-10-CM | POA: Diagnosis not present

## 2022-05-14 DIAGNOSIS — J45909 Unspecified asthma, uncomplicated: Secondary | ICD-10-CM

## 2022-05-14 DIAGNOSIS — R053 Chronic cough: Secondary | ICD-10-CM

## 2022-05-14 DIAGNOSIS — J302 Other seasonal allergic rhinitis: Secondary | ICD-10-CM

## 2022-05-14 DIAGNOSIS — J3089 Other allergic rhinitis: Secondary | ICD-10-CM | POA: Diagnosis not present

## 2022-05-14 MED ORDER — METHYLPREDNISOLONE ACETATE 80 MG/ML IJ SUSP
80.0000 mg | Freq: Once | INTRAMUSCULAR | Status: AC
  Administered 2022-05-14: 80 mg via INTRAMUSCULAR

## 2022-05-14 NOTE — Progress Notes (Signed)
$'@Patient'C$  ID: Grant [name], male    DOB: 1951/09/25, 71 y.o.   MRN: [id number]  Chief Complaint  Patient presents with   Follow-up    Referring provider: Shirline Frees, MD  HPI: 71 yo male former smoker followed for asthma and chronic bronchitis, allergic rhinitis, and OSA , chronic cough Medical history significant for A Fib on chronic anticoagulation, depression, angioedema, AVR , toxic encephalopathy, chronic fatigue with FM    TEST/EVENTS :  PFT 06/01/17-WNL-FVC 4.46/96%, FEV1 3.71/106%, ratio 0.83, FEF 25-75% 4.64/167%, no response to BD , TLC 107%, DLCO 101%    NPSG 03/24/03- AHI 88/ hr, desaturation to 75%, body weight 244 lbs   05/14/2022 Acute OV : Asthma and OSA  Presents for an acute office visit. Complains of increased cough, congestion , sinus congestion and drainage that has been ongoing for the last 6 weeks.  Patient has been seen 3 separate times and received antibiotics and steroids.  Patient is unable to take oral steroids including prednisone and Medrol.  He has received Depo-Medrol x2.  Patient presents today requesting a Depo-Medrol injection.  He was told that he would be able to get 1 today for ongoing cough and wheezing.  Discolored mucus has resolved is currently coughing up thick white mucus.  Also has significant nasal drainage and postnasal drip.  Has been using Tessalon Perles and occasional Delsym for cough.  Patient says he can get his cough under control if he uses enough of those medicines.  Patient has tried Chlor-Trimeton in the past but was unable to take due to increased anxiety.  Chest x-ray July 10 was clear. Patient denies any fever, chest pain, orthopnea, edema, calf pain. Patient is very frustrated and angry throughout the exam today.  Stating that he just wants his shot and to leave He is accompanied by his wife who is his caregiver. Appetite is fair.  No nausea vomiting or diarrhea  CPAP download shows 97% compliance with daily average usage  at 9.5 hours.  AHI is 7.2/hour.  Patient is on auto CPAP 5 to 15 cm H2O.  Daily average pressure at 14.5 cm H2O.   Allergies  Allergen Reactions   Perphenazine Swelling    Tongue swelling   Amiodarone Swelling   Codeine Itching   Prednisone     CAN TOLERATE DEPO. PREDNISONE BY MOUTH CAUSES MOOD CHANGES AND ITCHING.    Tramadol Other (See Comments)    dellusion    Tramadol Hcl     Other reaction(s): hallucinations    Immunization History  Administered Date(s) Administered   Fluad Quad(high Dose 65+) 07/07/2019   Influenza, High Dose Seasonal PF 06/25/2018, 07/23/2021   Influenza,inj,Quad PF,6+ Mos 10/31/2016, 07/12/2017   Influenza-Unspecified 07/12/2020   PFIZER(Purple Top)SARS-COV-2 Vaccination 11/26/2019, 12/21/2019, 07/12/2020, 04/02/2021, 07/23/2021   Pneumococcal Conjugate-13 06/25/2018   Pneumococcal Polysaccharide-23 07/12/2017   Zoster, Live 10/01/2020, 02/19/2021    Past Medical History:  Diagnosis Date   Allergic rhinitis    Anxiety    Arthritis    Asthma    Bladder tumor    Chronic fatigue    COVID-19 05/22/2021   Depression    06/30/17 Pt denies being depressed, reports Effexor is taken for Chronic Fatigue    Dyspnea    Enlarged prostate    Fibromyalgia    GERD (gastroesophageal reflux disease)    Headache    History of chronic bronchitis    History of migraine    History of toxic encephalopathy    Hypothyroidism  OSA on CPAP    CPAP 14   PAF (paroxysmal atrial fibrillation) (HCC) CARDIOLOGIST -- DR [name]   DX OCT 2013   S/P AVR (aortic valve replacement) and aortoplasty    Sensitiveness to light    Unspecified essential hypertension    Urethral tumor    PROSTATIC    Tobacco History: Social History   Tobacco Use  Smoking Status Former   Packs/day: 0.50   Years: 27.00   Total pack years: 13.50   Types: Cigarettes   Start date: 75   Quit date: 10/21/1979   Years since quitting: 42.5  Smokeless Tobacco Never   Counseling given: Not  Answered   Outpatient Medications Prior to Visit  Medication Sig Dispense Refill   albuterol (VENTOLIN HFA) 108 (90 Base) MCG/ACT inhaler INHALE 2 PUFFS INTO THE LUNGS EVERY 6 HOURS AS NEEDED FOR WHEEZING OR SHORTNESS OF BREATH (Patient taking differently: 2 puffs every 6 (six) hours as needed for shortness of breath.) 18 g 5   ALPRAZolam (XANAX) 0.25 MG tablet Take 1 tablet (0.25 mg total) by mouth 3 (three) times daily as needed for anxiety. 15 tablet 0   aluminum hydroxide-magnesium carbonate (GAVISCON) 95-358 MG/15ML SUSP Take 15 mLs by mouth 3 (three) times daily as needed for indigestion or heartburn.     azelastine (ASTELIN) 137 MCG/SPRAY nasal spray Place 1-2 sprays into the nose 2 (two) times daily as needed for rhinitis or allergies.      benzonatate (TESSALON) 200 MG capsule Take 1 capsule (200 mg total) by mouth 3 (three) times daily as needed for cough. 60 capsule 3   Budeson-Glycopyrrol-Formoterol (BREZTRI AEROSPHERE) 160-9-4.8 MCG/ACT AERO Inhale 2 puffs into the lungs in the morning and at bedtime. 10.7 g 0   cetirizine (ZYRTEC) 10 MG tablet Take 10 mg by mouth daily.     COLCRYS 0.6 MG tablet Take 0.6 mg by mouth in the morning.     Dextromethorphan HBr (DELSYM PO) Take by mouth as directed.     diclofenac Sodium (VOLTAREN) 1 % GEL Apply 2 g topically 3 (three) times daily as needed (arthritis pain.).     diphenhydrAMINE (BENADRYL) 25 mg capsule Take 50 mg by mouth daily as needed (allergies.).     EPINEPHrine 0.3 mg/0.3 mL IJ SOAJ injection Inject 0.3 mLs (0.3 mg total) into the muscle as needed (for angioedema). 2 Device 0   Erenumab-aooe (AIMOVIG) 140 MG/ML SOAJ Inject 140 mg into the skin every 28 (twenty-eight) days. 1 mL 11   Ferrous Sulfate (IRON) 142 (45 Fe) MG TBCR Take 45 mg by mouth in the morning and at bedtime. Only one tablet daily for the next 7 days (03/05/22)     finasteride (PROSCAR) 5 MG tablet Take 5 mg by mouth at bedtime.      guaiFENesin (MUCINEX) 600 MG 12  hr tablet Take 600 mg by mouth 2 (two) times daily as needed for to loosen phlegm or cough.     levothyroxine (SYNTHROID, LEVOTHROID) 112 MCG tablet Take 112 mcg by mouth daily before breakfast.      liothyronine (CYTOMEL) 25 MCG tablet Take 25 mcg by mouth daily before breakfast.     losartan (COZAAR) 50 MG tablet TAKE 1 TABLET BY MOUTH EVERY DAY 30 tablet 6   Menthol, Topical Analgesic, (BLUE-EMU MAXIMUM STRENGTH EX) Apply 1 application topically 3 (three) times daily as needed (arthritis pain.).     Methylcellulose, Laxative, (FIBER THERAPY) 500 MG TABS Take 1,000 mg by mouth in the morning.  metoprolol tartrate (LOPRESSOR) 50 MG tablet Take 1 tablet (50 mg total) by mouth 2 (two) times daily. 180 tablet 3   Multiple Vitamin (MULTIVITAMIN WITH MINERALS) TABS tablet Take 1 tablet by mouth in the morning.     NON FORMULARY Pt uses a cpap nightly     olopatadine (PATANOL) 0.1 % ophthalmic solution Place 1 drop into both eyes 2 (two) times daily as needed for allergies.     omeprazole (PRILOSEC) 40 MG capsule Take 1 capsule (40 mg total) by mouth 2 (two) times daily. 180 capsule 1   rizatriptan (MAXALT) 10 MG tablet Take 1 tablet (10 mg total) by mouth as needed for migraine. May repeat in 2 hours if needed 30 tablet 3   saccharomyces boulardii (FLORASTOR) 250 MG capsule Take 250 mg by mouth daily as needed (when taking antibiotics).     sodium chloride (OCEAN) 0.65 % SOLN nasal spray Place 1 spray into both nostrils as needed for congestion.     tamsulosin (FLOMAX) 0.4 MG CAPS capsule Take 0.4 mg by mouth in the morning and at bedtime.     Testosterone Cypionate 200 MG/ML SOLN Inject 100 mg into the muscle. Every 7-10 days     venlafaxine (EFFEXOR-XR) 150 MG 24 hr capsule Take 150 mg by mouth at bedtime.     XARELTO 20 MG TABS tablet TAKE 1 TABLET(20 MG) BY MOUTH DAILY WITH SUPPER 90 tablet 1   zolpidem (AMBIEN) 10 MG tablet Take 10 mg by mouth at bedtime as needed for sleep.      amoxicillin-clavulanate (AUGMENTIN) 875-125 MG tablet Take 1 tablet by mouth 2 (two) times daily. 14 tablet 0   chlorpheniramine-HYDROcodone (TUSSIONEX PENNKINETIC ER) 10-8 MG/5ML Take 5 mLs by mouth every 12 (twelve) hours as needed for cough. 115 mL 0   methylPREDNISolone (MEDROL) 4 MG tablet Take 1 tablet (4 mg total) by mouth daily. 15 tablet 0   SYMBICORT 160-4.5 MCG/ACT inhaler INHALE 2 PUFFS INTO THE LUNGS IN THE MORNING AND AT BEDTIME (Patient not taking: Reported on 05/14/2022) 10.2 g 1   Facility-Administered Medications Prior to Visit  Medication Dose Route Frequency Provider Last Rate Last Admin   methylPREDNISolone acetate (DEPO-MEDROL) injection (RADIOLOGY ONLY) 120 mg  120 mg Intramuscular Once Maryjane Hurter, MD         Review of Systems:   Constitutional:   No  weight loss, night sweats,  Fevers, chills,  +fatigue, or  lassitude.  HEENT:   No headaches,  Difficulty swallowing,  Tooth/dental problems, or  Sore throat,                No sneezing, itching, ear ache,  +nasal congestion, post nasal drip,   CV:  No chest pain,  Orthopnea, PND, swelling in lower extremities, anasarca, dizziness, palpitations, syncope.   GI  No heartburn, indigestion, abdominal pain, nausea, vomiting, diarrhea, change in bowel habits, loss of appetite, bloody stools.   Resp:  No chest wall deformity  Skin: no rash or lesions.  GU: no dysuria, change in color of urine, no urgency or frequency.  No flank pain, no hematuria   MS:  No joint pain or swelling.  No decreased range of motion.  No back pain.    Physical Exam  BP (!) 148/78 (BP Location: Left Arm, Cuff Size: Large)   Pulse 70   Temp 98.1 F (36.7 C) (Temporal)   Ht '5\' 10"'$  (1.778 m)   Wt 239 lb (108.4 kg)   SpO2 97%  BMI 34.29 kg/m   GEN: A/Ox3; pleasant , NAD, well nourished    HEENT:  [place]/AT,  EACs-clear, TMs-wnl, NOSE-clear drainage , THROAT-clear, no lesions, no postnasal drip or exudate noted. Non tender sinuses  ,   NECK:  Supple w/ fair ROM; no JVD; normal carotid impulses w/o bruits; no thyromegaly or nodules palpated; no lymphadenopathy.    RESP  Clear  P & A; w/o, wheezes/ rales/ or rhonchi. no accessory muscle use, no dullness to percussion  CARD:  RRR, no m/r/g, no peripheral edema, pulses intact, no cyanosis or clubbing.  GI:   Soft & nt; nml bowel sounds; no organomegaly or masses detected.   Musco: Warm bil, no deformities or joint swelling noted.   Neuro: alert, no focal deficits noted.    Skin: Warm, no lesions or rashes    Lab Results:    BMET   ProBNP No results found for: "PROBNP"  Imaging: DG Chest 2 View  Result Date: 04/29/2022 CLINICAL DATA:  Chronic bronchitis exacerbation. EXAM: CHEST - 2 VIEW COMPARISON:  February 04, 2022 FINDINGS: The heart size and mediastinal contours are stable. Aortic valvular replacement ring is unchanged. Both lungs are clear. The visualized skeletal structures are stable. IMPRESSION: No active cardiopulmonary disease. Electronically Signed   By: Abelardo Diesel M.D.   On: 04/29/2022 11:22    methylPREDNISolone acetate (DEPO-MEDROL) injection 120 mg     Date Action Dose Route User   04/09/2022 1436 Given 120 mg Intramuscular (Left Upper Outer Quadrant) Rosana Berger, CMA      methylPREDNISolone acetate (DEPO-MEDROL) injection 120 mg     Date Action Dose Route User   04/17/2022 1208 Given 120 mg Intramuscular (Right Upper Outer Quadrant) Mathis Bud, RN      methylPREDNISolone acetate (DEPO-MEDROL) injection 80 mg     Date Action Dose Route User   05/14/2022 1116 Given 80 mg Intramuscular (Left Upper Outer Quadrant) Mathis Bud, RN          Latest Ref Rng & Units 06/01/2017    9:54 AM  PFT Results  FVC-Pre L 4.31   FVC-Predicted Pre % 92   FVC-Post L 4.46   FVC-Predicted Post % 96   Pre FEV1/FVC % % 81   Post FEV1/FCV % % 83   FEV1-Pre L 3.49   FEV1-Predicted Pre % 100   FEV1-Post L 3.71   DLCO uncorrected  ml/min/mmHg 32.71   DLCO UNC% % 101   DLVA Predicted % 98   TLC L 7.52   TLC % Predicted % 107   RV % Predicted % 124     Lab Results  Component Value Date   NITRICOXIDE 28 11/15/2018        Assessment & Plan:   Acute asthmatic bronchitis Slow to resolve asthmatic bronchitic exacerbation.  Patient has completed 3 courses of antibiotics including azithromycin doxycycline and Augmentin.  Hold on additional antibiotics this time.  Check sputum culture.  Recent chest x-ray was clear.  Try to control for cough triggers.  Suspect he has a postviral/bronchitic cough.  Control for GERD and postnasal drainage. Long discussion with patient and wife regarding recurrent steroid injections.  Patient will receive an 80 mg Depo-Medrol injection today.  Do not feel that we should continue to do this on a routine basis as the risk outweighs the benefit. Patient education on steroids given  Plan  Patient Instructions  Sputum culture.  Depo medrol injection .  Restart Symbicort 160 2 puffs Twice daily  ,  rinse after use.  Add Delsym 2 tsp Twice daily for cough As needed   Sips of water to soothe throat and avoid throat clearing .  Tessalon Three times a day  As needed  Cough .  Zyrtec '10mg'$  daily  Follow up with Dr. Annamaria Boots in 2 months and As needed   Please contact office for sooner follow up if symptoms do not improve or worsen or seek emergency care       Obstructive sleep apnea Continue on CPAP  Seasonal and perennial allergic rhinitis Control for triggers. Add Zyrtec.  Plan  Patient Instructions  Sputum culture.  Depo medrol injection .  Restart Symbicort 160 2 puffs Twice daily  , rinse after use.  Add Delsym 2 tsp Twice daily for cough As needed   Sips of water to soothe throat and avoid throat clearing .  Tessalon Three times a day  As needed  Cough .  Zyrtec '10mg'$  daily  Follow up with Dr. Annamaria Boots in 2 months and As needed   Please contact office for sooner follow up if  symptoms do not improve or worsen or seek emergency care        Rexene Edison, NP 05/14/2022

## 2022-05-14 NOTE — Patient Instructions (Addendum)
Sputum culture.  Depo medrol injection .  Restart Symbicort 160 2 puffs Twice daily  , rinse after use.  Add Delsym 2 tsp Twice daily for cough As needed   Sips of water to soothe throat and avoid throat clearing .  Tessalon Three times a day  As needed  Cough .  Zyrtec '10mg'$  daily  Follow up with Dr. Annamaria Boots in 2 months and As needed   Please contact office for sooner follow up if symptoms do not improve or worsen or seek emergency care

## 2022-05-14 NOTE — Assessment & Plan Note (Signed)
Slow to resolve asthmatic bronchitic exacerbation.  Patient has completed 3 courses of antibiotics including azithromycin doxycycline and Augmentin.  Hold on additional antibiotics this time.  Check sputum culture.  Recent chest x-ray was clear.  Try to control for cough triggers.  Suspect he has a postviral/bronchitic cough.  Control for GERD and postnasal drainage. Long discussion with patient and wife regarding recurrent steroid injections.  Patient will receive an 80 mg Depo-Medrol injection today.  Do not feel that we should continue to do this on a routine basis as the risk outweighs the benefit. Patient education on steroids given  Plan  Patient Instructions  Sputum culture.  Depo medrol injection .  Restart Symbicort 160 2 puffs Twice daily  , rinse after use.  Add Delsym 2 tsp Twice daily for cough As needed   Sips of water to soothe throat and avoid throat clearing .  Tessalon Three times a day  As needed  Cough .  Zyrtec '10mg'$  daily  Follow up with Dr. Annamaria Boots in 2 months and As needed   Please contact office for sooner follow up if symptoms do not improve or worsen or seek emergency care

## 2022-05-14 NOTE — Assessment & Plan Note (Signed)
Control for triggers. Add Zyrtec.  Plan  Patient Instructions  Sputum culture.  Depo medrol injection .  Restart Symbicort 160 2 puffs Twice daily  , rinse after use.  Add Delsym 2 tsp Twice daily for cough As needed   Sips of water to soothe throat and avoid throat clearing .  Tessalon Three times a day  As needed  Cough .  Zyrtec '10mg'$  daily  Follow up with Dr. Annamaria Boots in 2 months and As needed   Please contact office for sooner follow up if symptoms do not improve or worsen or seek emergency care

## 2022-05-14 NOTE — Assessment & Plan Note (Signed)
Continue on CPAP  

## 2022-05-19 ENCOUNTER — Encounter (HOSPITAL_COMMUNITY): Payer: Self-pay | Admitting: Emergency Medicine

## 2022-05-19 ENCOUNTER — Other Ambulatory Visit: Payer: Self-pay

## 2022-05-19 ENCOUNTER — Emergency Department (HOSPITAL_COMMUNITY): Payer: Medicare Other

## 2022-05-19 ENCOUNTER — Emergency Department (HOSPITAL_COMMUNITY)
Admission: EM | Admit: 2022-05-19 | Discharge: 2022-05-19 | Disposition: A | Discharge status: Home / Self Care | Payer: Medicare Other | Attending: Emergency Medicine | Admitting: Emergency Medicine

## 2022-05-19 DIAGNOSIS — R202 Paresthesia of skin: Secondary | ICD-10-CM

## 2022-05-19 DIAGNOSIS — G4733 Obstructive sleep apnea (adult) (pediatric): Secondary | ICD-10-CM | POA: Diagnosis not present

## 2022-05-19 DIAGNOSIS — J449 Chronic obstructive pulmonary disease, unspecified: Secondary | ICD-10-CM | POA: Diagnosis not present

## 2022-05-19 DIAGNOSIS — G43909 Migraine, unspecified, not intractable, without status migrainosus: Secondary | ICD-10-CM | POA: Diagnosis not present

## 2022-05-19 DIAGNOSIS — Z7901 Long term (current) use of anticoagulants: Secondary | ICD-10-CM | POA: Insufficient documentation

## 2022-05-19 DIAGNOSIS — R519 Headache, unspecified: Secondary | ICD-10-CM | POA: Diagnosis not present

## 2022-05-19 DIAGNOSIS — R410 Disorientation, unspecified: Secondary | ICD-10-CM | POA: Diagnosis not present

## 2022-05-19 DIAGNOSIS — Z7951 Long term (current) use of inhaled steroids: Secondary | ICD-10-CM | POA: Insufficient documentation

## 2022-05-19 DIAGNOSIS — I10 Essential (primary) hypertension: Secondary | ICD-10-CM | POA: Insufficient documentation

## 2022-05-19 DIAGNOSIS — R4182 Altered mental status, unspecified: Secondary | ICD-10-CM | POA: Diagnosis not present

## 2022-05-19 DIAGNOSIS — I4891 Unspecified atrial fibrillation: Secondary | ICD-10-CM | POA: Diagnosis not present

## 2022-05-19 DIAGNOSIS — G9340 Encephalopathy, unspecified: Secondary | ICD-10-CM | POA: Diagnosis not present

## 2022-05-19 LAB — URINALYSIS, ROUTINE W REFLEX MICROSCOPIC
Bacteria, UA: NONE SEEN
Bilirubin Urine: NEGATIVE
Glucose, UA: NEGATIVE mg/dL
Hgb urine dipstick: NEGATIVE
Ketones, ur: NEGATIVE mg/dL
Leukocytes,Ua: NEGATIVE
Nitrite: NEGATIVE
Protein, ur: NEGATIVE mg/dL
Specific Gravity, Urine: 1.004 — ABNORMAL LOW (ref 1.005–1.030)
pH: 8 (ref 5.0–8.0)

## 2022-05-19 LAB — CBC
HCT: 50.8 % (ref 39.0–52.0)
Hemoglobin: 17.3 g/dL — ABNORMAL HIGH (ref 13.0–17.0)
MCH: 32.6 pg (ref 26.0–34.0)
MCHC: 34.1 g/dL (ref 30.0–36.0)
MCV: 95.8 fL (ref 80.0–100.0)
Platelets: 169 10*3/uL (ref 150–400)
RBC: 5.3 MIL/uL (ref 4.22–5.81)
RDW: 13.2 % (ref 11.5–15.5)
WBC: 7.3 10*3/uL (ref 4.0–10.5)
nRBC: 0 % (ref 0.0–0.2)

## 2022-05-19 LAB — COMPREHENSIVE METABOLIC PANEL
ALT: 30 U/L (ref 0–44)
AST: 23 U/L (ref 15–41)
Albumin: 4.1 g/dL (ref 3.5–5.0)
Alkaline Phosphatase: 51 U/L (ref 38–126)
Anion gap: 9 (ref 5–15)
BUN: 11 mg/dL (ref 8–23)
CO2: 28 mmol/L (ref 22–32)
Calcium: 9.1 mg/dL (ref 8.9–10.3)
Chloride: 102 mmol/L (ref 98–111)
Creatinine, Ser: 0.8 mg/dL (ref 0.61–1.24)
GFR, Estimated: >60 mL/min (ref >60)
Glucose, Bld: 111 mg/dL — ABNORMAL HIGH (ref 70–99)
Potassium: 3.8 mmol/L (ref 3.5–5.1)
Sodium: 139 mmol/L (ref 135–145)
Total Bilirubin: 0.9 mg/dL (ref 0.3–1.2)
Total Protein: 7.1 g/dL (ref 6.5–8.1)

## 2022-05-19 LAB — PROTIME-INR
INR: 1 (ref 0.8–1.2)
Prothrombin Time: 13.1 seconds (ref 11.4–15.2)

## 2022-05-19 LAB — TROPONIN I (HIGH SENSITIVITY): Troponin I (High Sensitivity): 11 ng/L (ref <18)

## 2022-05-19 LAB — CBG MONITORING, ED: Glucose-Capillary: 115 mg/dL — ABNORMAL HIGH (ref 70–99)

## 2022-05-19 LAB — APTT: aPTT: 30 seconds (ref 24–36)

## 2022-05-19 MED ORDER — LORAZEPAM 2 MG/ML IJ SOLN
1.0000 mg | Freq: Once | INTRAMUSCULAR | Status: AC
  Administered 2022-05-19: 1 mg via INTRAVENOUS
  Filled 2022-05-19: qty 1

## 2022-05-19 MED ORDER — LORAZEPAM 1 MG PO TABS: 1.0000 mg | ORAL_TABLET | Freq: Once | ORAL | Status: DC

## 2022-05-19 NOTE — ED Triage Notes (Signed)
Pt arrives POV w/ c/o AMS since waking up this morning & c/o left sided facial pain/numbness. Denies weakness or any other neuro deficits.

## 2022-05-19 NOTE — Discharge Instructions (Signed)
You were seen in the emergency department for your episode of confusion with numbness and tingling to your face.  You had a work-up performed today that showed no signs of stroke, dehydration anemia or heart abnormality as a cause of your symptoms.  Neurology evaluated you and think that your symptoms could be related to complex migraine and recommend that you follow-up with your neurologist as an outpatient for further work-up and management.  Your blood pressure was also high here in the emergency department and you should follow-up with your primary doctor to have your blood pressure rechecked to determine if you need any other changes to your medications.  You should return to the emergency department if you have numbness or weakness that does not go away, you have worsening confusion, you have associated chest pain, or if you have any other new or concerning symptoms.

## 2022-05-19 NOTE — ED Provider Notes (Signed)
[name] DEPT Provider Note   CSN: [id number] Arrival date & time: 05/19/22  0859     History  Chief Complaint  Patient presents with   Altered Mental Status   Facial Pain    [name] is a 71 y.o. male.  Patient is a 71 year old male with a past medical history of COPD, sleep apnea, aortic stenosis and aneurysm status post aortic valve replacement, A-fib on Xarelto, hypertension presenting to the emergency department after an episode of confusion and facial tingling this morning.  Per the patient and his wife when he woke up this morning he was confused and did not know where to find things that he normally looks for in the morning.  He also stated that the left side of his face felt tingly.  He denied any numbness or tingling in his arms or legs, denies any facial weakness or changes in voice. He denies associated headache. States that this episode lasted about 1 hour and then he came to the emergency department.  He states that he has had a decreased appetite recently but denies any recent nausea, vomiting or diarrhea, fevers or chills.  Denies any associated chest pain or shortness of breath or abdominal pain.  He states that he did miss some medications yesterday but otherwise has been taking his medications as prescribed.  The history is provided by the patient and the spouse.  Altered Mental Status      Home Medications Prior to Admission medications   Medication Sig Start Date End Date Taking? Authorizing Provider  acetaminophen (TYLENOL) 500 MG tablet Take 500 mg by mouth 2 (two) times daily as needed for headache (pain).   Yes [provider]  albuterol (VENTOLIN HFA) 108 (90 Base) MCG/ACT inhaler INHALE 2 PUFFS INTO THE LUNGS EVERY 6 HOURS AS NEEDED FOR WHEEZING OR SHORTNESS OF BREATH Patient taking differently: Inhale 2 puffs into the lungs every 6 (six) hours as needed for shortness of breath. 09/30/21  Yes Tanda Rockers,  MD  ALPRAZolam Duanne Moron) 0.25 MG tablet Take 1 tablet (0.25 mg total) by mouth 3 (three) times daily as needed for anxiety. 10/13/20  Yes Aline August, MD  aluminum hydroxide-magnesium carbonate (GAVISCON) 95-358 MG/15ML SUSP Take 15 mLs by mouth 3 (three) times daily as needed for indigestion or heartburn.   Yes [provider]  azelastine (ASTELIN) 137 MCG/SPRAY nasal spray Place 1-2 sprays into the nose 2 (two) times daily as needed for rhinitis or allergies.    Yes [provider]  benzonatate (TESSALON) 200 MG capsule Take 1 capsule (200 mg total) by mouth 3 (three) times daily as needed for cough. 08/20/21  Yes Parrett, [name] S, NP  cetirizine (ZYRTEC) 10 MG tablet Take 10 mg by mouth every morning.   Yes [provider]  colchicine (COLCRYS) 0.6 MG tablet Take 0.6 mg by mouth every morning.   Yes [provider]  dextromethorphan (DELSYM) 30 MG/5ML liquid Take 30 mg by mouth daily as needed for cough.   Yes [provider]  diclofenac Sodium (VOLTAREN) 1 % GEL Apply 1 Application topically at bedtime as needed (arthritis pain.).   Yes [provider]  diphenhydrAMINE (BENADRYL) 25 mg capsule Take 25 mg by mouth daily as needed for allergies.   Yes [provider]  EPINEPHrine 0.3 mg/0.3 mL IJ SOAJ injection Inject 0.3 mLs (0.3 mg total) into the muscle as needed (for angioedema). 02/26/18  Yes Josue Hector, MD  Eduard Roux ([place])  140 MG/ML SOAJ Inject 140 mg into the skin every 28 (twenty-eight) days. Patient taking differently: Inject 140 mg into the skin every 28 (twenty-eight) days. 01/15/22  Yes Cameron Sprang, MD  Ferrous Sulfate (IRON) 142 (45 Fe) MG TBCR Take 142 mg by mouth in the morning and at bedtime.   Yes [provider]  finasteride (PROSCAR) 5 MG tablet Take 5 mg by mouth at bedtime.  01/14/14  Yes [provider]  Guaifenesin (MUCINEX MAXIMUM STRENGTH) 1200 MG TB12 Take 1,200 mg by mouth every  morning.   Yes [provider]  ibuprofen (ADVIL) 200 MG tablet Take 400 mg by mouth 2 (two) times daily as needed for headache (pain).   Yes [provider]  levothyroxine (SYNTHROID, LEVOTHROID) 112 MCG tablet Take 112 mcg by mouth daily before breakfast.    Yes [provider]  liothyronine (CYTOMEL) 25 MCG tablet Take 25 mcg by mouth daily before breakfast.   Yes [provider]  losartan (COZAAR) 50 MG tablet TAKE 1 TABLET BY MOUTH EVERY DAY Patient taking differently: Take 50 mg by mouth every morning. 10/01/20  Yes [name], MD  Menthol, Topical Analgesic, (BLUE-EMU MAXIMUM STRENGTH EX) Apply 1 application topically 3 (three) times daily as needed (arthritis pain.).   Yes [provider]  Methylcellulose, Laxative, (FIBER THERAPY) 500 MG TABS Take 1,000 mg by mouth in the morning.   Yes [provider]  metoprolol tartrate (LOPRESSOR) 50 MG tablet Take 1 tablet (50 mg total) by mouth 2 (two) times daily. 06/04/21  Yes Josue Hector, MD  Multiple Vitamin (MULTIVITAMIN WITH MINERALS) TABS tablet Take 1 tablet by mouth in the morning.   Yes [provider]  naproxen sodium (ALEVE) 220 MG tablet Take 220 mg by mouth 2 (two) times daily as needed (pain/headache).   Yes [provider]  olopatadine (PATANOL) 0.1 % ophthalmic solution Place 1 drop into both eyes 2 (two) times daily as needed for allergies.   Yes [provider]  omeprazole (PRILOSEC) 40 MG capsule Take 1 capsule (40 mg total) by mouth 2 (two) times daily. 10/31/16  Yes Josue Hector, MD  PRESCRIPTION MEDICATION Inhale into the lungs at bedtime. CPAP   Yes [provider]  rizatriptan (MAXALT) 10 MG tablet Take 1 tablet (10 mg total) by mouth as needed for migraine. May repeat in 2 hours if needed Patient taking differently: Take 10 mg by mouth See admin instructions. Take one tablet (10 mg) by mouth at onset of migraine headache, may  repeat in 2 hours if needed 01/15/22  Yes Cameron Sprang, MD  saccharomyces boulardii (FLORASTOR) 250 MG capsule Take 250 mg by mouth every morning.   Yes [provider]  sodium chloride (OCEAN) 0.65 % SOLN nasal spray Place 1 spray into both nostrils as needed for congestion.   Yes [provider]  SYMBICORT 160-4.5 MCG/ACT inhaler INHALE 2 PUFFS INTO THE LUNGS IN THE MORNING AND AT BEDTIME Patient taking differently: Inhale 2 puffs into the lungs 2 (two) times daily. 04/04/22  Yes Young, Tarri Fuller D, MD  tamsulosin (FLOMAX) 0.4 MG CAPS capsule Take 0.4 mg by mouth in the morning and at bedtime.   Yes [provider]  Testosterone Cypionate 200 MG/ML SOLN Inject 200 mg into the muscle See admin instructions. Inject 1 ml (200 mg) intramuscularly once every 7-10 days   Yes [provider]  venlafaxine (EFFEXOR-XR) 150 MG 24 hr capsule Take 150 mg by mouth  at bedtime.   Yes [provider]  XARELTO 20 MG TABS tablet TAKE 1 TABLET(20 MG) BY MOUTH DAILY WITH SUPPER Patient taking differently: Take 20 mg by mouth daily with supper. 05/12/22  Yes Josue Hector, MD  zolpidem (AMBIEN) 10 MG tablet Take 10 mg by mouth at bedtime as needed for sleep. 11/06/21  Yes [provider]  amoxicillin-clavulanate (AUGMENTIN) 875-125 MG tablet Take 1 tablet by mouth 2 (two) times daily. Patient not taking: Reported on 05/19/2022 04/17/22   Baird Lyons D, MD  Budeson-Glycopyrrol-Formoterol (BREZTRI AEROSPHERE) 160-9-4.8 MCG/ACT AERO Inhale 2 puffs into the lungs in the morning and at bedtime. Patient not taking: Reported on 05/19/2022 04/28/22   Deneise Lever, MD  chlorpheniramine-HYDROcodone [facility]) 10-8 MG/5ML Take 5 mLs by mouth every 12 (twelve) hours as needed for cough. Patient not taking: Reported on 05/19/2022 04/28/22   Baird Lyons D, MD  methylPREDNISolone (MEDROL) 4 MG tablet Take 1 tablet (4 mg total) by mouth daily. Patient not  taking: Reported on 05/19/2022 04/28/22   Baird Lyons D, MD      Allergies    Perphenazine, Amiodarone, Codeine, Prednisone, and Tramadol    Review of Systems   Review of Systems  Physical Exam Updated Vital Signs BP (!) 157/96   Pulse 64   Temp 98 F (36.7 C) (Oral)   Resp 16   SpO2 94%  Physical Exam Vitals and nursing note reviewed.  Constitutional:      Appearance: Normal appearance. He is obese.  HENT:     Head: Normocephalic and atraumatic.     Nose: Nose normal.     Mouth/Throat:     Mouth: Mucous membranes are moist.  Eyes:     Extraocular Movements: Extraocular movements intact.     Conjunctiva/sclera: Conjunctivae normal.     Pupils: Pupils are equal, round, and reactive to light.  Cardiovascular:     Rate and Rhythm: Normal rate and regular rhythm.  Pulmonary:     Effort: Pulmonary effort is normal.     Breath sounds: Normal breath sounds.  Abdominal:     General: Abdomen is flat.     Palpations: Abdomen is soft.     Tenderness: There is no abdominal tenderness.  Musculoskeletal:        General: Normal range of motion.     Cervical back: Normal range of motion and neck supple.  Skin:    General: Skin is warm and dry.  Neurological:     General: No focal deficit present.     Mental Status: [name], place, and time.     Cranial Nerves: No cranial nerve deficit.     Sensory: No sensory deficit.     Motor: No weakness.     Coordination: Coordination normal.  Psychiatric:        Mood and Affect: Mood normal.        Behavior: Behavior normal.     ED Results / Procedures / Treatments   Labs (all labs ordered are listed, but only abnormal results are displayed) Labs Reviewed  CBC - Abnormal; Notable for the following components:      Result Value   Hemoglobin 17.3 (*)    All other components within normal limits  COMPREHENSIVE METABOLIC PANEL - Abnormal; Notable for the following components:   Glucose, Bld 111 (*)     All other components within normal limits  URINALYSIS, ROUTINE W REFLEX MICROSCOPIC - Abnormal; Notable for  the following components:   Color, Urine YELLOW (*)    Specific Gravity, Urine 1.004 (*)    All other components within normal limits  CBG MONITORING, ED - Abnormal; Notable for the following components:   Glucose-Capillary 115 (*)    All other components within normal limits  PROTIME-INR  APTT  TROPONIN I (HIGH SENSITIVITY)    EKG EKG Interpretation  Date/Time:  Monday May 19 2022 09:08:02 EDT Ventricular Rate:  61 PR Interval:  187 QRS Duration: 127 QT Interval:  433 QTC Calculation: 437 R Axis:   -40 Text Interpretation: Sinus rhythm Lateral t-wave inversions/flattening unchanged from prior EKG No longer in atrial flutter compared to prior EKG Previous PVCs now resolved PREVIOUS ECG IS PRESENT Confirmed by Oneal Deputy [phone]) on 05/19/2022 9:30:49 AM  Radiology MR BRAIN WO CONTRAST  Result Date: 05/19/2022 CLINICAL DATA:  Altered mental status, left-sided facial pain/numbness EXAM: MRI HEAD WITHOUT CONTRAST MRA HEAD WITHOUT CONTRAST TECHNIQUE: Multiplanar, multi-echo pulse sequences of the brain and surrounding structures were acquired without intravenous contrast. Angiographic images of the Circle of Willis were acquired using MRA technique without intravenous contrast. COMPARISON:  10/28/2021 MRI common correlation is also made with 05/19/2022 CT head FINDINGS: MRI HEAD FINDINGS Evaluation is somewhat limited by motion artifact. Brain: No restricted diffusion to suggest acute or subacute infarct. No acute hemorrhage, mass, mass effect, or midline shift. No hydrocephalus or extra-axial collection. 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 Scattered T2 hyperintense signal in the periventricular white matter and pons, likely the sequela of chronic small vessel ischemic disease. Focus of hemosiderin deposition in the left posterior frontal lobe white matter is favored to  represent the sequela of prior hypertensive microhemorrhage. Vascular: Normal arterial flow voids. Skull and upper cervical spine: Normal marrow signal. Sinuses/Orbits: Small mucous retention cyst in the right sphenoid sinus. No significant mucosal thickening. The orbits are unremarkable. Other: The mastoids are well aerated. MRA HEAD FINDINGS Anterior circulation: Both internal carotid arteries are patent to the termini, without significant stenosis. 1 mm outpouching near the right ICA terminus (series 13, image 68) may represent a tiny aneurysm or infundibulum. A1 segments patent. Normal anterior communicating artery. Anterior cerebral arteries are patent to their distal aspects. No M1 stenosis or occlusion. Distal MCA branches perfused and symmetric. Posterior circulation: Motion somewhat limits visualization of the proximal vertebral arteries, vertebrobasilar junction, and proximal basilar artery. Within this limitation, vertebral arteries patent to the vertebrobasilar junction without stenosis. Posterior inferior cerebral arteries are visualized bilaterally. Basilar patent to its distal aspect. Superior cerebellar arteries patent bilaterally. Patent P1 segments. PCAs perfused to their distal aspects without stenosis. The bilateral posterior communicating arteries are not visualized. Anatomic variants: None significant IMPRESSION: 1. Evaluation is somewhat limited by motion artifact. Within this limitation no acute intracranial process. 2. Evaluation is somewhat limited by motion artifact. Within this limitation, no intracranial large vessel occlusion or significant stenosis. 3. 1 mm outpouching near the right ICA terminus, at the expected location of the right posterior communicating artery may represent an infundibulum versus a tiny aneurysm. Electronically Signed   By: Merilyn Baba M.D.   On: 05/19/2022 12:51   MR ANGIO HEAD WO CONTRAST  Result Date: 05/19/2022 CLINICAL DATA:  Altered mental status,  left-sided facial pain/numbness EXAM: MRI HEAD WITHOUT CONTRAST MRA HEAD WITHOUT CONTRAST TECHNIQUE: Multiplanar, multi-echo pulse sequences of the brain and surrounding structures were acquired without intravenous contrast. Angiographic images of the Circle of Willis were acquired using MRA technique without intravenous contrast. COMPARISON:  10/28/2021 MRI common correlation is also made with 05/19/2022 CT head FINDINGS: MRI HEAD FINDINGS Evaluation is somewhat limited by motion artifact. Brain: No restricted diffusion to suggest acute or subacute infarct. No acute hemorrhage, mass, mass effect, or midline shift. No hydrocephalus or extra-axial collection. 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 Scattered T2 hyperintense signal in the periventricular white matter and pons, likely the sequela of chronic small vessel ischemic disease. Focus of hemosiderin deposition in the left posterior frontal lobe white matter is favored to represent the sequela of prior hypertensive microhemorrhage. Vascular: Normal arterial flow voids. Skull and upper cervical spine: Normal marrow signal. Sinuses/Orbits: Small mucous retention cyst in the right sphenoid sinus. No significant mucosal thickening. The orbits are unremarkable. Other: The mastoids are well aerated. MRA HEAD FINDINGS Anterior circulation: Both internal carotid arteries are patent to the termini, without significant stenosis. 1 mm outpouching near the right ICA terminus (series 13, image 68) may represent a tiny aneurysm or infundibulum. A1 segments patent. Normal anterior communicating artery. Anterior cerebral arteries are patent to their distal aspects. No M1 stenosis or occlusion. Distal MCA branches perfused and symmetric. Posterior circulation: Motion somewhat limits visualization of the proximal vertebral arteries, vertebrobasilar junction, and proximal basilar artery. Within this limitation, vertebral arteries patent to the vertebrobasilar junction without  stenosis. Posterior inferior cerebral arteries are visualized bilaterally. Basilar patent to its distal aspect. Superior cerebellar arteries patent bilaterally. Patent P1 segments. PCAs perfused to their distal aspects without stenosis. The bilateral posterior communicating arteries are not visualized. Anatomic variants: None significant IMPRESSION: 1. Evaluation is somewhat limited by motion artifact. Within this limitation no acute intracranial process. 2. Evaluation is somewhat limited by motion artifact. Within this limitation, no intracranial large vessel occlusion or significant stenosis. 3. 1 mm outpouching near the right ICA terminus, at the expected location of the right posterior communicating artery may represent an infundibulum versus a tiny aneurysm. Electronically Signed   By: Merilyn Baba M.D.   On: 05/19/2022 12:51   CT HEAD WO CONTRAST  Result Date: 05/19/2022 CLINICAL DATA:  Altered mental status.  Left-sided facial pain. EXAM: CT HEAD WITHOUT CONTRAST TECHNIQUE: Contiguous axial images were obtained from the base of the skull through the vertex without intravenous contrast. RADIATION DOSE REDUCTION: This exam was performed according to the departmental dose-optimization program which includes automated exposure control, adjustment of the mA and/or kV according to patient size and/or use of iterative reconstruction technique. COMPARISON:  February 16, 2019. FINDINGS: Brain: No evidence of acute infarction, hemorrhage, hydrocephalus, extra-axial collection or mass lesion/mass effect. Vascular: No hyperdense vessel or unexpected calcification. Skull: Normal. Negative for fracture or focal lesion. Sinuses/Orbits: No acute finding. Other: None. IMPRESSION: No acute intracranial abnormality seen. Electronically Signed   By: Marijo Conception M.D.   On: 05/19/2022 10:35   DG Chest 2 View  Result Date: 05/19/2022 CLINICAL DATA:  Altered mental status EXAM: CHEST - 2 VIEW COMPARISON:  Chest x-ray dated  March 29, 2022 FINDINGS: Cardiac and mediastinal contours are unchanged post median sternotomy and aortic valve replacement. Unchanged fractures of some of the mediastinal wires. Lungs are clear. No pleural effusion or pneumothorax. IMPRESSION: No active cardiopulmonary disease. Electronically Signed   By: Yetta Glassman M.D.   On: 05/19/2022 09:39    Procedures Procedures    Medications Ordered in ED Medications  LORazepam (ATIVAN) injection 1 mg (1 mg Intravenous Given 05/19/22 1145)    ED Course/ Medical Decision Making/ A&P Clinical Course as of 05/19/22 1455  Mon May 19, 2022  0942 CXR negative for acute disease [VK]  1006 Labs within normal range, patient pending CTH, UA and reassessment.  [VK]  1038 CTH Negative, MRI will be performed to further evaluate for TIA. Patient remains asymptomatic and at neurologic baseline. [VK]  8850 MRI without acute findings [VK]  1313 Patient continues to remain asymptomatic at this time. Neurology will be consulted for further recommendations regarding possible TIA. UA pending. 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 Patient is additionally lower risk of vascular etiology being on Xeralto. Neurology recommends outpatient follow up with his neurologist for further management. [VK]    Clinical Course User Index [VK] Ottie Glazier, DO                           Medical Decision Making Patient is a 71 year old male with a past medical history of COPD, sleep apnea, aortic stenosis and aneurysm status post aortic valve replacement, A-fib on Xarelto, hypertension presenting to the emergency department after an episode of confusion and facial tingling this morning.  Accu-Chek was performed upon arrival that was in normal range making hypoglycemia or hyperglycemia and likely cause of his altered mental status.  The patient is back  to his neurologic baseline and due to his symptoms earlier this morning, and concern for possible TIA he will have CT scan and labs performed.  Additionally concern for ACS or electrolyte abnormality, anemia as causes of his generalized weakness and fatigue.  Amount and/or Complexity of Data Reviewed External Data Reviewed: notes.    Details: Patient seen by his pulmonologist last week and treated with a dose of steroids for acute on chronic bronchitis as well as restarted on his inhaled steroid.  Neurology visit March 2023 - hx of migraines, chronic fatigue syndrome with intermittent episodes of confusion. Prior MRI/MRA negative, prior EEG negative. Labs: ordered. Radiology: ordered.  Risk Prescription drug management.   Discharge home discussed with patient and his wife who are agreeable with plan.  He is stable for discharge and is given strict return precautions.        Final Clinical Impression(s) / ED Diagnoses Final diagnoses:  Hypertension, unspecified type  Episodic confusion  Facial tingling    Rx / DC Orders ED Discharge Orders     None         Ottie Glazier, DO 05/19/22 1455

## 2022-05-19 NOTE — Consult Note (Signed)
[facility] TeleNeurology Consult Services  Stat Consult  Patient Name:   Grant [name], Grant [name] Date of Birth:   10/23/1950 Identification Number:   MRN - [id number] Date of Service:   05/19/2022 14:11:13  Diagnosis:       G93.49 - Encephalopathy Multifactorial  Impression 71yo M with hx of chronic migraines, Afib on Xarelto, AV replacement, HTN, HL, depression/anxiety, chronic fatigue presenting, OSA with L facial numbness/tingling and confusion. Teleneuro exam is unremarkable with NIHSS 0, although patient still reports subjective tingling on the L side of his face. MRI brain was negative for any acute abnormalities. I suspect that the facial numbness/tingling and possibly the confusion may be related to his chronic migraines. The patient also has a long history of chronic fatigue syndrome with confusional episodes; this is not a new/acute issue. Currently he is A&O x 4 and speaking/acting appropriately. Patient is well-established with an outpatient neurologist with whom he can follow up.    Recommendations: Our recommendations are outlined below.  Disposition : Outpatient Neurology follow up in 1-3 weeks  ----------------------------------------------------------------------------------------------------    Metrics: [facility] Notification Time: 05/19/2022 14:09:32 Stamp Time: 05/19/2022 14:11:13 Callback Response Time: 05/19/2022 14:14:17   CT HEAD: As Per Radiologist CT Head Showed No Acute Hemorrhage or Acute Core Infarct   Imaging MRI brain, MRA head: 1. Evaluation is somewhat limited by motion artifact. Within this limitation no acute intracranial process. 2. Evaluation is somewhat limited by motion artifact. Within this limitation, no intracranial large vessel occlusion or significant stenosis. 3. 1 mm outpouching near the right ICA terminus, at the expected location of the right posterior communicating artery may represent an infundibulum  versus a tiny aneurysm.   ----------------------------------------------------------------------------------------------------  Chief Complaint: L facial numbness/tingling, confusion  History of Present Illness: Patient is a 71 year old Male. 71yo M with hx of chronic migraines, Afib on Xarelto, AV replacement, HTN, HL, depression/anxiety, chronic fatigue presenting, OSA with L facial numbness/tingling and confusion. Patient last felt normal last night. This morning he awoke with a numb/tingly sensation on the L side of his face. This includes the L cheek and extends behind the L eye. He has been feeling "spaced out" all day. He recalls all events from tis morning/afternoon; he is not amnestic for any of it. He was having some difficulty walking, though this happens to him frequently with his chronic fatigue syndrome where he feels his legs just won't cooperate with him. He has also been feeling extremely somnolent all day today.  Per neurologist note 01/15/2022, patient has been having episodes of confusion over the last 15+ years.   Past Medical History:      Migraine Headaches Other PMH:  chronic migraines, Afib on Xarelto, AV replacement, HTN, HL, depression/anxiety, chronic fatigue presenting, OSA  Medications:  Anticoagulant use:  Yes Xarelto No Antiplatelet use Reviewed EMR for current medications  Allergies:  Reviewed  Social History: Patient Is: Married Smoking: Former Alcohol Use: Yes  Family History:  There is no family history of premature cerebrovascular disease pertinent to this consultation  ROS : 14 Points Review of Systems was performed and was negative except mentioned in HPI.  Past Surgical History: There Is No Surgical History Contributory To Today's Visit   Examination: BP(157/96), Pulse(64), Blood Glucose(115) 1A: Level of Consciousness - Alert; keenly responsive + 0 1B: Ask Month and Age - Both Questions Right + 0 1C: Blink Eyes & Squeeze Hands -  Performs Both Tasks + 0 2: Test Horizontal Extraocular Movements - Normal + 0 3:  Test Visual Fields - No Visual Loss + 0 4: Test Facial Palsy (Use Grimace if Obtunded) - Normal symmetry + 0 5A: Test Left Arm Motor Drift - No Drift for 10 Seconds + 0 5B: Test Right Arm Motor Drift - No Drift for 10 Seconds + 0 6A: Test Left Leg Motor Drift - No Drift for 5 Seconds + 0 6B: Test Right Leg Motor Drift - No Drift for 5 Seconds + 0 7: Test Limb Ataxia (FNF/Heel-Shin) - No Ataxia + 0 8: Test Sensation - Normal; No sensory loss + 0 9: Test Language/Aphasia - Normal; No aphasia + 0 10: Test Dysarthria - Normal + 0 11: Test Extinction/Inattention - No abnormality + 0  NIHSS Score: 0 NIHSS Free Text : Still feels subjective tingling on L side of face     Patient / Family was informed the Neurology Consult would occur via TeleHealth consult by way of interactive audio and video telecommunications and consented to receiving care in this manner.  Patient is being evaluated for possible acute neurologic impairment and high probability of imminent or life - threatening deterioration.I spent total of 35 minutes providing care to this patient, including time for face to face visit via telemedicine, review of medical records, imaging studies and discussion of findings with providers, the patient and / or family.   Dr Damaris Hippo   [facility] For Inpatient follow-up with [facility] physician please call [phone]. This is not an outpatient service. Post hospital discharge, please contact hospital directly.

## 2022-05-19 NOTE — ED Notes (Signed)
Patient transported to MRI 

## 2022-05-21 ENCOUNTER — Encounter: Payer: Self-pay | Admitting: Internal Medicine

## 2022-05-21 ENCOUNTER — Encounter: Payer: Self-pay | Admitting: Neurology

## 2022-05-21 DIAGNOSIS — G4733 Obstructive sleep apnea (adult) (pediatric): Secondary | ICD-10-CM | POA: Diagnosis not present

## 2022-05-22 ENCOUNTER — Ambulatory Visit (INDEPENDENT_AMBULATORY_CARE_PROVIDER_SITE_OTHER): Payer: Medicare Other

## 2022-05-22 DIAGNOSIS — G43019 Migraine without aura, intractable, without status migrainosus: Secondary | ICD-10-CM | POA: Diagnosis not present

## 2022-05-22 MED ORDER — METOCLOPRAMIDE HCL 5 MG/ML IJ SOLN
10.0000 mg | Freq: Once | INTRAMUSCULAR | Status: AC
  Administered 2022-05-22: 10 mg via INTRAMUSCULAR

## 2022-05-22 MED ORDER — KETOROLAC TROMETHAMINE 60 MG/2ML IM SOLN
60.0000 mg | Freq: Once | INTRAMUSCULAR | Status: AC
  Administered 2022-05-22: 60 mg via INTRAMUSCULAR

## 2022-05-22 MED ORDER — DIPHENHYDRAMINE HCL 50 MG/ML IJ SOLN: 25.0000 mg | Freq: Once | INTRAMUSCULAR | Status: DC

## 2022-05-22 MED ORDER — DIPHENHYDRAMINE HCL 50 MG/ML IJ SOLN
50.0000 mg | Freq: Once | INTRAMUSCULAR | Status: AC
  Administered 2022-05-22: 25 mg via INTRAMUSCULAR

## 2022-05-22 NOTE — Telephone Encounter (Signed)
Will forward to Dr. Annamaria Boots as Juluis Rainier. Pt has upcoming PFT and OV on 9/18.

## 2022-05-23 ENCOUNTER — Telehealth: Payer: Self-pay | Admitting: Neurology

## 2022-05-23 NOTE — Assessment & Plan Note (Signed)
He continues to benefit from CPAP with good control Plan continue CPAP 12

## 2022-05-23 NOTE — Telephone Encounter (Signed)
Ok to come back today for another migraine cocktail, thanks

## 2022-05-23 NOTE — Telephone Encounter (Signed)
[name] called in stating the pt had a headache cocktail yesterday, but it didn't make it go away. It's a little better than it was. She stated he is physically resistant to most medications. She says he usually needs larger dosages of things. She wants to see if he can get another one today?

## 2022-05-23 NOTE — Telephone Encounter (Signed)
Called Pt and left message.

## 2022-05-23 NOTE — Assessment & Plan Note (Addendum)
Managing this as a subacute exacerbation of chronic tracheobronchitis.  No response to antibiotics and nothing seen on imaging. Plan-trial of Depo-Medrol 120 mg.  Supportive measures including Mucinex, throat lozenges, sips of liquids, Augmentin

## 2022-05-26 NOTE — Telephone Encounter (Signed)
Called Pt to check on him and wife reported that he woke up and had no headache. Was told to call us if they needed [place].

## 2022-05-27 DIAGNOSIS — E039 Hypothyroidism, unspecified: Secondary | ICD-10-CM | POA: Diagnosis not present

## 2022-05-27 DIAGNOSIS — I10 Essential (primary) hypertension: Secondary | ICD-10-CM | POA: Diagnosis not present

## 2022-05-27 DIAGNOSIS — F3341 Major depressive disorder, recurrent, in partial remission: Secondary | ICD-10-CM | POA: Diagnosis not present

## 2022-05-27 DIAGNOSIS — E782 Mixed hyperlipidemia: Secondary | ICD-10-CM | POA: Diagnosis not present

## 2022-05-29 ENCOUNTER — Other Ambulatory Visit: Payer: Self-pay | Admitting: Cardiovascular Disease

## 2022-05-29 NOTE — Progress Notes (Addendum)
COVID Vaccine received:  '[]'$  No '[x]'$  Yes Date of any COVID positive Test in last 90 days: None  PCP - Shirline Frees, MD Cardiologist - [name], MD   Cardiac Clearance - Christen Bame, NP-C 04-03-22 note in Epic.   Neurology- Ellouise Newer, MD Pulmonology - Baird Lyons MD   Chest x-ray - 05-19-22  Epic EKG -  05-19-22 Epic   Stress Test -  ECHO - 02-28-22 Epic Cardiac Cath -   Pacemaker/ICD device     '[x]'$  N/A Spinal Cord Stimulator:'[x]'$  No '[]'$  Yes   Other Implants:   Bowel Prep - no information given per patient  History of Sleep Apnea? '[]'$  No '[x]'$  Yes   Sleep Study Date:   CPAP used?- '[]'$  No '[x]'$  Yes  (Instruct to bring their mask & Tubing)  Does the patient monitor blood sugar?   '[x]'$  N/A  Blood Thinner Instructions: XARELTO  Last Dose: Hold 2 days ok per Christen Bame, NP-C note on 04-03-22  ERAS Protocol Ordered: '[]'$  No  '[x]'$  Yes PRE-SURGERY '[x]'$  ENSURE  X 3  (Two bottles night prior and 1 the DOS)  Comments: Patient's wife needs to be with him in Preop up until he goes back for surgery. Patient has severe anxiety and relies on his wife to answer questions  Activity level: Patient can climb a flight of stairs without difficulty;  '[x]'$  No CP  '[x]'$  No SOB,  but would have abdominal pain   Anesthesia review: A.Fib (cardioversion 11-28-21 Dr. Quentin Ore), OSA(CPAP), hx AVR / BENTALL procedure (2018, Dr. Prescott Gum)  Patient denies shortness of breath, fever, cough and chest pain at PAT appointment.  Patient verbalized understanding and agreement to the Pre-Surgical Instructions that were given to them at this PAT appointment. Patient was also educated of the need to review these PAT instructions again prior to his/her surgery.I reviewed the appropriate phone numbers to call if they have any and questions or concerns.

## 2022-05-29 NOTE — Patient Instructions (Addendum)
DUE TO SPACE LIMITATIONS, ONLY TWO VISITORS  (aged 69 and older) ARE ALLOWED TO COME WITH YOU AND STAY IN THE WAITING ROOM DURING YOUR PRE OP AND PROCEDURE.   **NO VISITORS ARE ALLOWED IN THE SHORT STAY AREA OR RECOVERY ROOM!!**   You are not required to quarantine at this time prior to your surgery. However, you must do this: Hand Hygiene often Do NOT share personal items Notify your provider if you are in close contact with someone who has COVID or you develop fever 100.4 or greater, new onset of sneezing, cough, sore throat, shortness of breath or body aches.       Your procedure is scheduled on:  Wednesday June 11, 2022  Report to [facility] Main Entrance.  Report to admitting at: 06:15 AM  +++++Call this number if you have any questions or problems the morning of surgery [phone]  Do not eat food :After Midnight the night prior to your surgery/procedure.  After Midnight you may have the following liquids until  05:30 AM DAY OF SURGERY  Clear Liquid Diet Water Black Coffee (sugar ok, NO MILK/CREAM OR CREAMERS)  Tea (sugar ok, NO MILK/CREAM OR CREAMERS) regular and decaf                             Plain Jell-O (NO RED)                                           Fruit ices (not with fruit pulp, NO RED)                                     Popsicles (NO RED)                                                                  Juice: apple, WHITE grape, WHITE cranberry Sports drinks like Gatorade (NO RED)                  THE EVENING BEFORE SURGERY, DRINK TWO (2) on the Pre-Surgery Clear Ensures     The day of surgery:  Drink ONE (1) Pre-Surgery Clear Ensure at  05:30 AM the morning of surgery. Drink in one sitting. Do not sip.  This drink was given to you during your hospital pre-op appointment visit. Nothing else to drink after completing the Pre-Surgery Clear Ensure     FOLLOW BOWEL PREP AND ANY ADDITIONAL PRE OP INSTRUCTIONS YOU RECEIVED FROM YOUR SURGEON'S  OFFICE!!!   Oral Hygiene is also important to reduce your risk of infection.        Remember - BRUSH YOUR TEETH THE MORNING OF SURGERY WITH YOUR REGULAR TOOTHPASTE  These are anesthesia recommendations for holding your anticoagulants. This has been approved by your cardiologist.  Xarelto Rivaroxaban   HOLD 2 DAYS     Take ONLY these medicines the morning of surgery with A SIP OF WATER: Symbicort inhaler, Metoprolol, Colchicine, tamsulosin, Synthroid, Omeprazole, Liothyronine (Cytomel). If needed, you may take Xanax and use your Albuterol Inhaler.  Bring CPAP mask and tubing day of surgery.                   You may not have any metal on your body including  jewelry, and body piercing  Do not wear  lotions, powders, cologne, or deodorant  Men may shave face and neck.  DO NOT [place]. +++++   (You may bring your INHALERS) PHARMACY WILL DISPENSE MEDICATIONS LISTED ON YOUR MEDICATION LIST TO YOU DURING [place]!   Patients discharged on the day of surgery will not be allowed to drive home.  Someone NEEDS to stay with you for the first 24 hours after anesthesia.  Special Instructions: Bring a copy of your healthcare power of attorney and living will documents the day of surgery, if you wish to have them scanned into your [facility] Medical Records- EPIC  Please read over the following fact sheets you were given: IF YOU HAVE QUESTIONS ABOUT YOUR PRE-OP INSTRUCTIONS, PLEASE CALL [phone]  ([place])   [place] - Preparing for Surgery Before surgery, you can play an important role.  Because skin is not sterile, your skin needs to be as free of germs as possible.  You can reduce the number of germs on your skin by washing with CHG (chlorahexidine gluconate) soap before surgery.  CHG is an antiseptic cleaner which kills germs and bonds with the skin to continue killing germs even after washing. Please DO NOT use if you have an allergy  to CHG or antibacterial soaps.  If your skin becomes reddened/irritated stop using the CHG and inform your nurse when you arrive at Short Stay. Do not shave (including legs and underarms) for at least 48 hours prior to the first CHG shower.  You may shave your face/neck.  Please follow these instructions carefully:  1.  Shower with CHG Soap the night before surgery and the  morning of surgery.  2.  If you choose to wash your hair, wash your hair first as usual with your normal  shampoo.  3.  After you shampoo, rinse your hair and body thoroughly to remove the shampoo.                             4.  Use CHG as you would any other liquid soap.  You can apply chg directly to the skin and wash.  Gently with a scrungie or clean washcloth.  5.  Apply the CHG Soap to your body ONLY FROM THE NECK DOWN.   Do not use on face/ open                           Wound or open sores. Avoid contact with eyes, ears mouth and genitals (private parts).                       Wash face,  Genitals (private parts) with your normal soap.             6.  Wash thoroughly, paying special attention to the area where your  surgery  will be performed.  7.  Thoroughly rinse your body with warm water from the neck down.  8.  DO NOT shower/wash with your normal soap after using and rinsing off the CHG Soap.            9.  Pat yourself dry with a clean towel.            10.  Wear clean pajamas.            11.  Place clean sheets on your bed the night of your first shower and do not  sleep with pets.  ON THE DAY OF SURGERY : Do not apply any lotions/deodorants the morning of surgery.  Please wear clean clothes to the hospital/surgery center.    FAILURE TO FOLLOW THESE INSTRUCTIONS MAY RESULT IN THE CANCELLATION OF YOUR SURGERY  PATIENT SIGNATURE_________________________________  NURSE SIGNATURE__________________________________  ________________________________________________________________________

## 2022-06-02 ENCOUNTER — Encounter (HOSPITAL_COMMUNITY)
Admission: RE | Admit: 2022-06-02 | Discharge: 2022-06-02 | Disposition: A | Discharge status: Home / Self Care | Payer: Medicare Other | Source: Ambulatory Visit | Attending: Surgery | Admitting: Surgery

## 2022-06-02 ENCOUNTER — Encounter (HOSPITAL_COMMUNITY): Payer: Self-pay

## 2022-06-02 ENCOUNTER — Other Ambulatory Visit: Payer: Self-pay | Admitting: Internal Medicine

## 2022-06-02 ENCOUNTER — Other Ambulatory Visit: Payer: Self-pay

## 2022-06-02 DIAGNOSIS — Z01818 Encounter for other preprocedural examination: Secondary | ICD-10-CM | POA: Insufficient documentation

## 2022-06-02 HISTORY — DX: Pneumonia, unspecified organism: J18.9

## 2022-06-02 NOTE — Telephone Encounter (Signed)
Symbicort refilled.

## 2022-06-03 DIAGNOSIS — M542 Cervicalgia: Secondary | ICD-10-CM | POA: Diagnosis not present

## 2022-06-03 DIAGNOSIS — M9901 Segmental and somatic dysfunction of cervical region: Secondary | ICD-10-CM | POA: Diagnosis not present

## 2022-06-03 DIAGNOSIS — G43001 Migraine without aura, not intractable, with status migrainosus: Secondary | ICD-10-CM | POA: Diagnosis not present

## 2022-06-03 DIAGNOSIS — M9900 Segmental and somatic dysfunction of head region: Secondary | ICD-10-CM | POA: Diagnosis not present

## 2022-06-04 DIAGNOSIS — M542 Cervicalgia: Secondary | ICD-10-CM | POA: Diagnosis not present

## 2022-06-04 DIAGNOSIS — M9901 Segmental and somatic dysfunction of cervical region: Secondary | ICD-10-CM | POA: Diagnosis not present

## 2022-06-04 DIAGNOSIS — G43001 Migraine without aura, not intractable, with status migrainosus: Secondary | ICD-10-CM | POA: Diagnosis not present

## 2022-06-04 DIAGNOSIS — M9900 Segmental and somatic dysfunction of head region: Secondary | ICD-10-CM | POA: Diagnosis not present

## 2022-06-06 NOTE — Progress Notes (Signed)
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 [name].   Per cardiology note 04/03/2022, "Chart reviewed as part of pre-operative protocol coverage. Given past medical history and time since last visit, based on ACC/AHA guidelines, Grant [name] would be at acceptable risk for the planned procedure without further cardiovascular testing.    Per office protocol, patient can hold Xarelto for 2 days prior to procedure."     Asthma stable at PAT visit, evaluate DOS.   Anticipate pt can proceed with planned procedure barring acute status change.   VS: BP (!) 127/100 Comment: no meds yet  Pulse 76   Temp 36.7 C (Oral)   Resp 20   Ht '5\' 10"'$  (1.778 m)   Wt 108.9 kg   SpO2 96%   BMI 34.44 kg/m   PROVIDERS: Shirline Frees, MD  Primary Cardiologist: [name], MD   [name], MD is Cardiothoracic Surgeon LABS: Labs reviewed: Acceptable for surgery. (all labs ordered are listed, but only abnormal results are displayed)  Labs Reviewed - No data to display   IMAGES:   EKG:   CV: Echo 02/28/2022 1. Left ventricular ejection fraction, by estimation, is 55 to 60%. The  left ventricle has normal function. The left ventricle has no regional  wall motion abnormalities. There is moderate left ventricular hypertrophy.  Left ventricular diastolic  parameters are indeterminate.   2. Right ventricular systolic function is mildly reduced. The right  ventricular size is normal. There is  normal pulmonary artery systolic  pressure. The estimated right ventricular systolic pressure is 47.0 mmHg.   3. Left atrial size was mildly dilated.   4. The mitral valve is normal in structure. Trivial mitral valve  regurgitation. No evidence of mitral stenosis.   5. There is a 25 mm bioprosthetic valve present in the aortic position      Aortic valve regurgitation is not visualized. Vmax 3.2 m/s, EOA 1.4  cm^2, DI 0.29, MG 76mHg (stable from prior echo 10/28/21)   6. Aortic root has been replaced. There is mild dilatation of the  ascending aorta, measuring 38 mm.   7. The inferior vena cava is normal in size with greater than 50%  respiratory variability, suggesting right atrial pressure of 3 mmHg.  Past Medical History:  Diagnosis Date   Allergic rhinitis    Anxiety    Arthritis    Asthma    Bladder tumor    Chronic fatigue    COVID-19 05/22/2021   Depression    06/30/17 Pt denies being depressed, reports Effexor is taken for Chronic Fatigue    Dyspnea    Enlarged prostate    Fibromyalgia    GERD (gastroesophageal reflux disease)    Headache    History of chronic bronchitis    History of migraine    History of toxic encephalopathy    Hypothyroidism    OSA on CPAP    CPAP 14   PAF (paroxysmal atrial fibrillation) ([facility] --  DR [name]   DX OCT 2013   Pneumonia    S/P AVR (aortic valve replacement) and aortoplasty    Sensitiveness to light    Unspecified essential hypertension    Urethral tumor    PROSTATIC    Past Surgical History:  Procedure Laterality Date   BENTALL PROCEDURE N/A 07/02/2017   Procedure: BENTALL PROCEDURE;  Surgeon: Ivin Poot, MD;  Location: [place];  Service: Open Heart Surgery;  Laterality: N/A;  WITH CIRC ARREST   BENTALL PROCEDURE  07/02/2017   w/ Aortic valve replacement    by Dr. Prescott Gum   BIOPSY  02/06/2021   Procedure: BIOPSY;  Surgeon: Clarene Essex, MD;  [facility];  Service: Endoscopy;;   BUBBLE STUDY   11/01/2021   Procedure: BUBBLE STUDY;  Surgeon: Geralynn Rile, MD;  Location: [place];  Service: Cardiovascular;;   CARDIOVERSION N/A 11/28/2021   Procedure: CARDIOVERSION (CATH LAB);  Surgeon: Vickie Epley, MD;  Location: [place] [facility];  Service: Cardiovascular;  Laterality: N/A;   COLONOSCOPY     COLONOSCOPY WITH PROPOFOL N/A 02/06/2021   Procedure: COLONOSCOPY WITH PROPOFOL;  Surgeon: Clarene Essex, MD;  [facility];  Service: Endoscopy;  Laterality: N/A;   CORONARY ANGIOPLASTY  06/12/2017   CYSTOSCOPY W/ RETROGRADES Bilateral 06/15/2013   Procedure: CYSTOSCOPY WITH BILATERAL RETROGRADE PYELOGRAM  BLADDER BIOPSY, PROSTATIC URETHRAL BIOPSY, ;  Surgeon: Molli Hazard, MD;  [facility];  Service: Urology;  Laterality: Bilateral;   ESOPHAGOGASTRODUODENOSCOPY (EGD) WITH PROPOFOL N/A 03/11/2018   Procedure: ESOPHAGOGASTRODUODENOSCOPY (EGD) WITH PROPOFOL;  Surgeon: Clarene Essex, MD;  Location: [place];  Service: Endoscopy;  Laterality: N/A;   LAPAROSCOPIC CHOLECYSTECTOMY  01/14/2001   PROCTOSCOPY N/A 06/12/2021   Procedure: RIGID PROCTOSCOPY;  Surgeon: [name], MD;  [facility];  Service: General;  Laterality: N/A;   RIGHT/LEFT HEART CATH AND CORONARY ANGIOGRAPHY N/A 06/12/2017   Procedure: RIGHT/LEFT HEART CATH AND CORONARY ANGIOGRAPHY;  Surgeon: Larey Dresser, MD;  Location: [place] [facility];  Service: Cardiovascular;  Laterality: N/A;   TEE WITHOUT CARDIOVERSION N/A 07/02/2017   Procedure: TRANSESOPHAGEAL ECHOCARDIOGRAM (TEE);  Surgeon: Prescott Gum, Collier Salina, MD;  Location: [place];  Service: Open Heart Surgery;  Laterality: N/A;   TEE WITHOUT CARDIOVERSION N/A 11/01/2021   Procedure: TRANSESOPHAGEAL ECHOCARDIOGRAM (TEE);  Surgeon: Geralynn Rile, MD;  Location: [place];  Service: Cardiovascular;  Laterality: N/A;   UMBILICAL HERNIA REPAIR  01/14/2008   WISDOM TOOTH EXTRACTION      MEDICATIONS:   acetaminophen (TYLENOL) 500 MG tablet   albuterol (VENTOLIN HFA) 108 (90 Base) MCG/ACT inhaler   ALPRAZolam (XANAX) 0.25 MG tablet   aluminum hydroxide-magnesium carbonate (GAVISCON) 95-358 MG/15ML SUSP   amoxicillin-clavulanate (AUGMENTIN) 875-125 MG tablet   azelastine (ASTELIN) 137 MCG/SPRAY nasal spray   benzonatate (TESSALON) 200 MG capsule   Budeson-Glycopyrrol-Formoterol (BREZTRI AEROSPHERE) 160-9-4.8 MCG/ACT AERO   budesonide-formoterol (SYMBICORT) 160-4.5 MCG/ACT inhaler   cetirizine (ZYRTEC) 10 MG tablet   chlorpheniramine-HYDROcodone (TUSSIONEX PENNKINETIC ER) 10-8 MG/5ML   colchicine (COLCRYS) 0.6 MG tablet   dextromethorphan (DELSYM) 30 MG/5ML liquid   diclofenac Sodium (VOLTAREN) 1 % GEL   diphenhydrAMINE (BENADRYL) 25 mg capsule   EPINEPHrine 0.3 mg/0.3 mL IJ SOAJ injection   Erenumab-aooe (AIMOVIG) 140 MG/ML SOAJ   Ferrous Sulfate (IRON) 142 (45 Fe) MG TBCR   finasteride (PROSCAR) 5 MG tablet   Guaifenesin (MUCINEX MAXIMUM STRENGTH) 1200 MG TB12   ibuprofen (ADVIL) 200 MG tablet   ipratropium (ATROVENT)  0.03 % nasal spray   levothyroxine (SYNTHROID, LEVOTHROID) 112 MCG tablet   liothyronine (CYTOMEL) 25 MCG tablet   losartan (COZAAR) 50 MG tablet   Menthol, Topical Analgesic, (BLUE-EMU MAXIMUM STRENGTH EX)   Methylcellulose, Laxative, (FIBER THERAPY) 500 MG TABS   metoprolol tartrate (LOPRESSOR) 50 MG tablet   Multiple Vitamin (MULTIVITAMIN WITH MINERALS) TABS tablet   naproxen sodium (ALEVE) 220 MG tablet   olopatadine (PATANOL) 0.1 % ophthalmic solution   omeprazole (PRILOSEC) 40 MG capsule   PRESCRIPTION MEDICATION   rizatriptan (MAXALT) 10 MG tablet   saccharomyces boulardii (FLORASTOR) 250 MG capsule   sodium chloride (OCEAN) 0.65 % SOLN nasal spray   tamsulosin (FLOMAX) 0.4 MG CAPS capsule   Testosterone Cypionate 200 MG/ML SOLN   venlafaxine (EFFEXOR-XR) 150 MG 24 hr capsule   XARELTO 20 MG TABS tablet   zolpidem (AMBIEN) 10 MG tablet     methylPREDNISolone acetate (DEPO-MEDROL) injection (RADIOLOGY ONLY) 120 mg    Grant Felix Ward, PA-C [facility] Pre-Surgical Testing [phone]

## 2022-06-09 DIAGNOSIS — M9900 Segmental and somatic dysfunction of head region: Secondary | ICD-10-CM | POA: Diagnosis not present

## 2022-06-09 DIAGNOSIS — M9901 Segmental and somatic dysfunction of cervical region: Secondary | ICD-10-CM | POA: Diagnosis not present

## 2022-06-09 DIAGNOSIS — M542 Cervicalgia: Secondary | ICD-10-CM | POA: Diagnosis not present

## 2022-06-09 DIAGNOSIS — G43001 Migraine without aura, not intractable, with status migrainosus: Secondary | ICD-10-CM | POA: Diagnosis not present

## 2022-06-10 NOTE — Anesthesia Preprocedure Evaluation (Addendum)
Anesthesia Evaluation  Patient identified by MRN, date of birth, ID band Patient awake    Reviewed: Allergy & Precautions, NPO status , Patient's Chart, lab work & pertinent test results  Airway Mallampati: IV  TM Distance: >3 FB Neck ROM: Full    Dental no notable dental hx.    Pulmonary asthma , sleep apnea and Continuous Positive Airway Pressure Ventilation , former smoker,    Pulmonary exam normal        Cardiovascular hypertension, Pt. on medications and Pt. on home beta blockers Normal cardiovascular exam+ dysrhythmias Atrial Fibrillation   ECHO: Left ventricular ejection fraction, by estimation, is 55 to 60%. The left ventricle has normal function. The left ventricle has no regional wall motion abnormalities. There is moderate left ventricular hypertrophy. Left ventricular diastolic parameters are indeterminate. Right ventricular systolic function is mildly reduced. The right ventricular size is normal. There is normal pulmonary artery systolic pressure. The estimated right ventricular systolic pressure is 29.0 mmHg. Left atrial size was mildly dilated. The mitral valve is normal in structure. Trivial mitral valve regurgitation. No evidence of mitral stenosis. 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 The inferior vena cava is normal in size with greater than 50% respiratory variability, suggesting right atrial pressure of 3 mmHg.   Neuro/Psych  Headaches, PSYCHIATRIC DISORDERS Anxiety Depression  Neuromuscular disease    GI/Hepatic Neg liver ROS, GERD  Medicated and Controlled,  Endo/Other  Hypothyroidism   Renal/GU Renal disease     Musculoskeletal  (+) Arthritis ,  Fibromyalgia -  Abdominal (+) + obese,   Peds  Hematology  (+) Blood dyscrasia, ,   Anesthesia Other Findings PERIUMBILICAL RECURRENT AND INCARCERATED ABDOMINAL WALL HERNIA  Reproductive/Obstetrics                            Anesthesia Physical Anesthesia Plan  ASA: 3  Anesthesia Plan: General   Post-op Pain Management:    Induction: Intravenous  PONV Risk Score and Plan: 2 and Ondansetron, Dexamethasone, Midazolam and Treatment may vary due to age or medical condition  Airway Management Planned: Oral ETT  Additional Equipment:   Intra-op Plan:   Post-operative Plan: Extubation in OR  Informed Consent: I have reviewed the patients History and Physical, chart, labs and discussed the procedure including the risks, benefits and alternatives for the proposed anesthesia with the patient or authorized representative who has indicated his/her understanding and acceptance.     Dental advisory given  Plan Discussed with: CRNA  Anesthesia Plan Comments:         Anesthesia Quick Evaluation

## 2022-06-11 ENCOUNTER — Encounter: Payer: Self-pay | Admitting: Cardiothoracic Surgery

## 2022-06-11 ENCOUNTER — Encounter (HOSPITAL_COMMUNITY)
Admission: RE | Disposition: A | Discharge status: Home / Self Care | Payer: Self-pay | Source: Ambulatory Visit | Attending: Surgery

## 2022-06-11 ENCOUNTER — Encounter (HOSPITAL_COMMUNITY): Payer: Self-pay

## 2022-06-11 ENCOUNTER — Other Ambulatory Visit: Payer: Self-pay

## 2022-06-11 ENCOUNTER — Ambulatory Visit (HOSPITAL_COMMUNITY)
Admission: RE | Admit: 2022-06-11 | Discharge: 2022-06-11 | Disposition: A | Discharge status: Home / Self Care | Payer: Medicare Other | Source: Ambulatory Visit | Attending: Surgery | Admitting: Surgery

## 2022-06-11 ENCOUNTER — Emergency Department (HOSPITAL_COMMUNITY)
Admission: EM | Admit: 2022-06-11 | Discharge: 2022-06-11 | Disposition: A | Discharge status: Home / Self Care | Payer: Medicare Other | Source: Home / Self Care | Attending: Emergency Medicine | Admitting: Emergency Medicine

## 2022-06-11 ENCOUNTER — Ambulatory Visit (HOSPITAL_COMMUNITY): Payer: Medicare Other | Admitting: Physician Assistant

## 2022-06-11 ENCOUNTER — Ambulatory Visit (HOSPITAL_BASED_OUTPATIENT_CLINIC_OR_DEPARTMENT_OTHER): Payer: Medicare Other | Admitting: Certified Registered"

## 2022-06-11 DIAGNOSIS — K429 Umbilical hernia without obstruction or gangrene: Secondary | ICD-10-CM

## 2022-06-11 DIAGNOSIS — S31119A Laceration without foreign body of abdominal wall, unspecified quadrant without penetration into peritoneal cavity, initial encounter: Secondary | ICD-10-CM | POA: Insufficient documentation

## 2022-06-11 DIAGNOSIS — Y838 Other surgical procedures as the cause of abnormal reaction of the patient, or of later complication, without mention of misadventure at the time of the procedure: Secondary | ICD-10-CM | POA: Insufficient documentation

## 2022-06-11 DIAGNOSIS — Z7901 Long term (current) use of anticoagulants: Secondary | ICD-10-CM | POA: Insufficient documentation

## 2022-06-11 DIAGNOSIS — E039 Hypothyroidism, unspecified: Secondary | ICD-10-CM | POA: Diagnosis not present

## 2022-06-11 DIAGNOSIS — T8131XA Disruption of external operation (surgical) wound, not elsewhere classified, initial encounter: Secondary | ICD-10-CM | POA: Diagnosis not present

## 2022-06-11 DIAGNOSIS — K436 Other and unspecified ventral hernia with obstruction, without gangrene: Secondary | ICD-10-CM | POA: Insufficient documentation

## 2022-06-11 DIAGNOSIS — Z87891 Personal history of nicotine dependence: Secondary | ICD-10-CM

## 2022-06-11 DIAGNOSIS — K56699 Other intestinal obstruction unspecified as to partial versus complete obstruction: Secondary | ICD-10-CM | POA: Diagnosis not present

## 2022-06-11 DIAGNOSIS — I10 Essential (primary) hypertension: Secondary | ICD-10-CM

## 2022-06-11 DIAGNOSIS — F419 Anxiety disorder, unspecified: Secondary | ICD-10-CM | POA: Diagnosis not present

## 2022-06-11 DIAGNOSIS — K430 Incisional hernia with obstruction, without gangrene: Secondary | ICD-10-CM | POA: Diagnosis not present

## 2022-06-11 DIAGNOSIS — Z9049 Acquired absence of other specified parts of digestive tract: Secondary | ICD-10-CM | POA: Diagnosis not present

## 2022-06-11 DIAGNOSIS — Z79899 Other long term (current) drug therapy: Secondary | ICD-10-CM | POA: Insufficient documentation

## 2022-06-11 DIAGNOSIS — M6208 Separation of muscle (nontraumatic), other site: Secondary | ICD-10-CM | POA: Diagnosis not present

## 2022-06-11 DIAGNOSIS — T83728A Exposure of other implanted mesh and other prosthetic materials to surrounding organ or tissue, initial encounter: Secondary | ICD-10-CM | POA: Diagnosis not present

## 2022-06-11 DIAGNOSIS — F32A Depression, unspecified: Secondary | ICD-10-CM | POA: Diagnosis not present

## 2022-06-11 DIAGNOSIS — T8130XA Disruption of wound, unspecified, initial encounter: Secondary | ICD-10-CM

## 2022-06-11 DIAGNOSIS — K219 Gastro-esophageal reflux disease without esophagitis: Secondary | ICD-10-CM | POA: Diagnosis not present

## 2022-06-11 DIAGNOSIS — Z6834 Body mass index (BMI) 34.0-34.9, adult: Secondary | ICD-10-CM | POA: Insufficient documentation

## 2022-06-11 DIAGNOSIS — I480 Paroxysmal atrial fibrillation: Secondary | ICD-10-CM | POA: Insufficient documentation

## 2022-06-11 DIAGNOSIS — G4733 Obstructive sleep apnea (adult) (pediatric): Secondary | ICD-10-CM | POA: Diagnosis not present

## 2022-06-11 DIAGNOSIS — J45909 Unspecified asthma, uncomplicated: Secondary | ICD-10-CM | POA: Diagnosis not present

## 2022-06-11 DIAGNOSIS — M797 Fibromyalgia: Secondary | ICD-10-CM | POA: Insufficient documentation

## 2022-06-11 DIAGNOSIS — X58XXXA Exposure to other specified factors, initial encounter: Secondary | ICD-10-CM | POA: Insufficient documentation

## 2022-06-11 DIAGNOSIS — K460 Unspecified abdominal hernia with obstruction, without gangrene: Secondary | ICD-10-CM | POA: Diagnosis not present

## 2022-06-11 HISTORY — PX: EXCISION OF MESH: SHX6268

## 2022-06-11 HISTORY — PX: VENTRAL HERNIA REPAIR: SHX424

## 2022-06-11 LAB — BASIC METABOLIC PANEL
Anion gap: 6 (ref 5–15)
BUN: 11 mg/dL (ref 8–23)
CO2: 25 mmol/L (ref 22–32)
Calcium: 8.3 mg/dL — ABNORMAL LOW (ref 8.9–10.3)
Chloride: 106 mmol/L (ref 98–111)
Creatinine, Ser: 0.89 mg/dL (ref 0.61–1.24)
GFR, Estimated: >60 mL/min (ref >60)
Glucose, Bld: 157 mg/dL — ABNORMAL HIGH (ref 70–99)
Potassium: 4 mmol/L (ref 3.5–5.1)
Sodium: 137 mmol/L (ref 135–145)

## 2022-06-11 LAB — CBC
HCT: 45.1 % (ref 39.0–52.0)
Hemoglobin: 15.1 g/dL (ref 13.0–17.0)
MCH: 32.8 pg (ref 26.0–34.0)
MCHC: 33.5 g/dL (ref 30.0–36.0)
MCV: 98 fL (ref 80.0–100.0)
Platelets: 156 10*3/uL (ref 150–400)
RBC: 4.6 MIL/uL (ref 4.22–5.81)
RDW: 13.9 % (ref 11.5–15.5)
WBC: 15 10*3/uL — ABNORMAL HIGH (ref 4.0–10.5)
nRBC: 0 % (ref 0.0–0.2)

## 2022-06-11 SURGERY — REPAIR, HERNIA, VENTRAL, LAPAROSCOPIC
Anesthesia: General

## 2022-06-11 MED ORDER — MIDAZOLAM HCL 2 MG/2ML IJ SOLN
INTRAMUSCULAR | Status: DC | PRN
  Administered 2022-06-11: 2 mg via INTRAVENOUS

## 2022-06-11 MED ORDER — CHLORHEXIDINE GLUCONATE CLOTH 2 % EX PADS: 6.0000 | MEDICATED_PAD | Freq: Once | CUTANEOUS | Status: DC

## 2022-06-11 MED ORDER — BUPIVACAINE-EPINEPHRINE (PF) 0.5% -1:200000 IJ SOLN
INTRAMUSCULAR | Status: AC
  Filled 2022-06-11: qty 30

## 2022-06-11 MED ORDER — AMISULPRIDE (ANTIEMETIC) 5 MG/2ML IV SOLN
10.0000 mg | Freq: Once | INTRAVENOUS | Status: DC | PRN
Start: 2022-06-11 — End: 2022-06-11

## 2022-06-11 MED ORDER — HYDROCODONE-ACETAMINOPHEN 5-325 MG PO TABS
1.0000 | ORAL_TABLET | Freq: Once | ORAL | Status: AC
  Administered 2022-06-11: 1 via ORAL
  Filled 2022-06-11: qty 1

## 2022-06-11 MED ORDER — EPHEDRINE 5 MG/ML INJ
INTRAVENOUS | Status: AC
  Filled 2022-06-11: qty 5

## 2022-06-11 MED ORDER — FENTANYL CITRATE (PF) 100 MCG/2ML IJ SOLN
INTRAMUSCULAR | Status: AC
  Filled 2022-06-11: qty 2

## 2022-06-11 MED ORDER — METHOCARBAMOL 750 MG PO TABS
750.0000 mg | ORAL_TABLET | Freq: Four times a day (QID) | ORAL | 2 refills | Status: DC | PRN
Start: 2022-06-11 — End: 2023-12-14

## 2022-06-11 MED ORDER — LIDOCAINE 2% (20 MG/ML) 5 ML SYRINGE
INTRAMUSCULAR | Status: AC
  Filled 2022-06-11: qty 5

## 2022-06-11 MED ORDER — LACTATED RINGERS IR SOLN
Status: DC | PRN
  Administered 2022-06-11: 1000 mL

## 2022-06-11 MED ORDER — EPHEDRINE SULFATE-NACL 50-0.9 MG/10ML-% IV SOSY
PREFILLED_SYRINGE | INTRAVENOUS | Status: DC | PRN
  Administered 2022-06-11 (×2): 10 mg via INTRAVENOUS
  Administered 2022-06-11: 5 mg via INTRAVENOUS

## 2022-06-11 MED ORDER — ENSURE PRE-SURGERY PO LIQD
296.0000 mL | Freq: Once | ORAL | Status: DC
  Filled 2022-06-11: qty 296

## 2022-06-11 MED ORDER — CEFAZOLIN SODIUM-DEXTROSE 2-4 GM/100ML-% IV SOLN
2.0000 g | INTRAVENOUS | Status: AC
  Administered 2022-06-11: 2 g via INTRAVENOUS
  Filled 2022-06-11: qty 100

## 2022-06-11 MED ORDER — PROPOFOL 10 MG/ML IV BOLUS
INTRAVENOUS | Status: AC
  Filled 2022-06-11: qty 20

## 2022-06-11 MED ORDER — STERILE WATER FOR IRRIGATION IR SOLN
Status: DC | PRN
  Administered 2022-06-11: 1000 mL

## 2022-06-11 MED ORDER — BUPIVACAINE-EPINEPHRINE (PF) 0.5% -1:200000 IJ SOLN
INTRAMUSCULAR | Status: DC | PRN
  Administered 2022-06-11: 30 mL

## 2022-06-11 MED ORDER — ORAL CARE MOUTH RINSE: 15.0000 mL | Freq: Once | OROMUCOSAL | Status: AC

## 2022-06-11 MED ORDER — 0.9 % SODIUM CHLORIDE (POUR BTL) OPTIME
TOPICAL | Status: DC | PRN
  Administered 2022-06-11: 1000 mL

## 2022-06-11 MED ORDER — DEXAMETHASONE SODIUM PHOSPHATE 10 MG/ML IJ SOLN
INTRAMUSCULAR | Status: DC | PRN
  Administered 2022-06-11: 4 mg via INTRAVENOUS

## 2022-06-11 MED ORDER — LIDOCAINE-EPINEPHRINE (PF) 2 %-1:200000 IJ SOLN
10.0000 mL | Freq: Once | INTRAMUSCULAR | Status: AC
  Administered 2022-06-11: 10 mL
  Filled 2022-06-11: qty 20

## 2022-06-11 MED ORDER — LACTATED RINGERS IV SOLN: INTRAVENOUS | Status: DC | PRN

## 2022-06-11 MED ORDER — BUPIVACAINE HCL (PF) 0.5 % IJ SOLN
INTRAMUSCULAR | Status: AC
  Filled 2022-06-11: qty 30

## 2022-06-11 MED ORDER — ACETAMINOPHEN 500 MG PO TABS
1000.0000 mg | ORAL_TABLET | ORAL | Status: AC
  Administered 2022-06-11: 1000 mg via ORAL
  Filled 2022-06-11: qty 2

## 2022-06-11 MED ORDER — HYDROCODONE-ACETAMINOPHEN 5-325 MG PO TABS: 1.0000 | ORAL_TABLET | Freq: Four times a day (QID) | ORAL | 0 refills | Status: DC | PRN

## 2022-06-11 MED ORDER — SODIUM CHLORIDE (PF) 0.9 % IJ SOLN
INTRAMUSCULAR | Status: DC | PRN
  Administered 2022-06-11: 30 mL

## 2022-06-11 MED ORDER — BUPIVACAINE-EPINEPHRINE 0.5% -1:200000 IJ SOLN
INTRAMUSCULAR | Status: AC
  Filled 2022-06-11: qty 1

## 2022-06-11 MED ORDER — FENTANYL CITRATE PF 50 MCG/ML IJ SOSY: 25.0000 ug | PREFILLED_SYRINGE | INTRAMUSCULAR | Status: DC | PRN

## 2022-06-11 MED ORDER — GABAPENTIN 300 MG PO CAPS
300.0000 mg | ORAL_CAPSULE | ORAL | Status: AC
  Administered 2022-06-11: 300 mg via ORAL
  Filled 2022-06-11: qty 1

## 2022-06-11 MED ORDER — PROPOFOL 10 MG/ML IV BOLUS
INTRAVENOUS | Status: DC | PRN
  Administered 2022-06-11: 200 mg via INTRAVENOUS

## 2022-06-11 MED ORDER — ROCURONIUM BROMIDE 10 MG/ML (PF) SYRINGE
PREFILLED_SYRINGE | INTRAVENOUS | Status: DC | PRN
  Administered 2022-06-11: 10 mg via INTRAVENOUS
  Administered 2022-06-11: 20 mg via INTRAVENOUS
  Administered 2022-06-11: 60 mg via INTRAVENOUS
  Administered 2022-06-11: 20 mg via INTRAVENOUS
  Administered 2022-06-11: 5 mg via INTRAVENOUS
  Administered 2022-06-11: 10 mg via INTRAVENOUS

## 2022-06-11 MED ORDER — OXYCODONE HCL 5 MG PO TABS
ORAL_TABLET | ORAL | Status: AC
  Filled 2022-06-11: qty 2

## 2022-06-11 MED ORDER — FENTANYL CITRATE (PF) 250 MCG/5ML IJ SOLN: INTRAMUSCULAR | Status: DC | PRN

## 2022-06-11 MED ORDER — CHLORHEXIDINE GLUCONATE 0.12 % MT SOLN
15.0000 mL | Freq: Once | OROMUCOSAL | Status: AC
  Administered 2022-06-11: 15 mL via OROMUCOSAL

## 2022-06-11 MED ORDER — ONDANSETRON HCL 4 MG/2ML IJ SOLN
INTRAMUSCULAR | Status: AC
  Filled 2022-06-11: qty 2

## 2022-06-11 MED ORDER — OXYCODONE HCL 5 MG PO TABS
10.0000 mg | ORAL_TABLET | Freq: Once | ORAL | Status: AC
  Administered 2022-06-11: 10 mg via ORAL

## 2022-06-11 MED ORDER — FENTANYL CITRATE (PF) 100 MCG/2ML IJ SOLN
INTRAMUSCULAR | Status: DC | PRN
  Administered 2022-06-11: 50 ug via INTRAVENOUS
  Administered 2022-06-11: 100 ug via INTRAVENOUS
  Administered 2022-06-11: 50 ug via INTRAVENOUS

## 2022-06-11 MED ORDER — ROCURONIUM BROMIDE 10 MG/ML (PF) SYRINGE
PREFILLED_SYRINGE | INTRAVENOUS | Status: AC
  Filled 2022-06-11: qty 10

## 2022-06-11 MED ORDER — MIDAZOLAM HCL 2 MG/2ML IJ SOLN
INTRAMUSCULAR | Status: AC
  Filled 2022-06-11: qty 2

## 2022-06-11 MED ORDER — BUPIVACAINE LIPOSOME 1.3 % IJ SUSP
INTRAMUSCULAR | Status: AC
  Filled 2022-06-11: qty 20

## 2022-06-11 MED ORDER — SODIUM CHLORIDE (PF) 0.9 % IJ SOLN
INTRAMUSCULAR | Status: AC
  Filled 2022-06-11: qty 30

## 2022-06-11 MED ORDER — ONDANSETRON HCL 4 MG/2ML IJ SOLN
4.0000 mg | Freq: Once | INTRAMUSCULAR | Status: DC | PRN
Start: 2022-06-11 — End: 2022-06-11

## 2022-06-11 MED ORDER — LIDOCAINE 2% (20 MG/ML) 5 ML SYRINGE
INTRAMUSCULAR | Status: DC | PRN
  Administered 2022-06-11: 60 mg via INTRAVENOUS

## 2022-06-11 MED ORDER — DEXAMETHASONE SODIUM PHOSPHATE 10 MG/ML IJ SOLN
INTRAMUSCULAR | Status: AC
  Filled 2022-06-11: qty 1

## 2022-06-11 MED ORDER — SUGAMMADEX SODIUM 200 MG/2ML IV SOLN
INTRAVENOUS | Status: DC | PRN
  Administered 2022-06-11: 220 mg via INTRAVENOUS

## 2022-06-11 MED ORDER — PHENYLEPHRINE HCL-NACL 20-0.9 MG/250ML-% IV SOLN
INTRAVENOUS | Status: DC | PRN
  Administered 2022-06-11: 20 ug/min via INTRAVENOUS
  Administered 2022-06-11: 40 ug/min via INTRAVENOUS

## 2022-06-11 MED ORDER — BUPIVACAINE LIPOSOME 1.3 % IJ SUSP
20.0000 mL | Freq: Once | INTRAMUSCULAR | Status: AC
  Administered 2022-06-11: 20 mL

## 2022-06-11 MED ORDER — ONDANSETRON HCL 4 MG/2ML IJ SOLN
INTRAMUSCULAR | Status: DC | PRN
  Administered 2022-06-11: 4 mg via INTRAVENOUS

## 2022-06-11 MED ORDER — SUGAMMADEX SODIUM 500 MG/5ML IV SOLN
INTRAVENOUS | Status: AC
  Filled 2022-06-11: qty 5

## 2022-06-11 MED ORDER — ENSURE PRE-SURGERY PO LIQD
592.0000 mL | Freq: Once | ORAL | Status: DC
  Filled 2022-06-11: qty 592

## 2022-06-11 MED ORDER — LACTATED RINGERS IV SOLN: INTRAVENOUS | Status: DC

## 2022-06-11 SURGICAL SUPPLY — 45 items
APL PRP STRL LF DISP 70% ISPRP (MISCELLANEOUS) ×1
APPLIER CLIP 5 13 M/L LIGAMAX5 (MISCELLANEOUS)
APR CLP MED LRG 5 ANG JAW (MISCELLANEOUS)
BAG COUNTER SPONGE SURGICOUNT (BAG) ×2 IMPLANT
BAG SPNG CNTER NS LX DISP (BAG) ×1
BINDER ABDOMINAL 12 ML 46-62 (SOFTGOODS) IMPLANT
CABLE HIGH FREQUENCY MONO STRZ (ELECTRODE) ×2 IMPLANT
CHLORAPREP W/TINT 26 (MISCELLANEOUS) ×2 IMPLANT
CLIP APPLIE 5 13 M/L LIGAMAX5 (MISCELLANEOUS) IMPLANT
COVER SURGICAL LIGHT HANDLE (MISCELLANEOUS) ×2 IMPLANT
DEVICE SECURE STRAP 25 ABSORB (INSTRUMENTS) IMPLANT
DEVICE TROCAR PUNCTURE CLOSURE (ENDOMECHANICALS) ×2 IMPLANT
DRAPE WARM FLUID 44X44 (DRAPES) ×2 IMPLANT
DRSG TEGADERM 2-3/8X2-3/4 SM (GAUZE/BANDAGES/DRESSINGS) ×6 IMPLANT
DRSG TEGADERM 4X4.75 (GAUZE/BANDAGES/DRESSINGS) ×2 IMPLANT
ELECT REM PT RETURN 15FT ADLT (MISCELLANEOUS) ×2 IMPLANT
GAUZE SPONGE 2X2 8PLY STRL LF (GAUZE/BANDAGES/DRESSINGS) IMPLANT
GLOVE ECLIPSE 8.0 STRL XLNG CF (GLOVE) ×2 IMPLANT
GLOVE INDICATOR 8.0 STRL GRN (GLOVE) ×2 IMPLANT
GOWN STRL REUS W/ TWL XL LVL3 (GOWN DISPOSABLE) ×6 IMPLANT
GOWN STRL REUS W/TWL XL LVL3 (GOWN DISPOSABLE) ×3
IRRIG SUCT STRYKERFLOW 2 WTIP (MISCELLANEOUS)
IRRIGATION SUCT STRKRFLW 2 WTP (MISCELLANEOUS) IMPLANT
KIT BASIN OR (CUSTOM PROCEDURE TRAY) ×2 IMPLANT
KIT TURNOVER KIT A (KITS) IMPLANT
MARKER SKIN DUAL TIP RULER LAB (MISCELLANEOUS) ×2 IMPLANT
MESH VENTRALIGHT ST 4X6IN (Mesh General) IMPLANT
NDL SPNL 22GX3.5 QUINCKE BK (NEEDLE) IMPLANT
NEEDLE SPNL 22GX3.5 QUINCKE BK (NEEDLE) ×1 IMPLANT
PAD POSITIONING PINK XL (MISCELLANEOUS) ×2 IMPLANT
PENCIL SMOKE EVACUATOR (MISCELLANEOUS) IMPLANT
SCISSORS LAP 5X35 DISP (ENDOMECHANICALS) ×2 IMPLANT
SET TUBE SMOKE EVAC HIGH FLOW (TUBING) ×2 IMPLANT
SLEEVE ADV FIXATION 5X100MM (TROCAR) ×2 IMPLANT
SPIKE FLUID TRANSFER (MISCELLANEOUS) ×2 IMPLANT
STRIP CLOSURE SKIN 1/2X4 (GAUZE/BANDAGES/DRESSINGS) ×4 IMPLANT
SUT MNCRL AB 4-0 PS2 18 (SUTURE) ×2 IMPLANT
SUT PDS AB 1 CT1 27 (SUTURE) ×4 IMPLANT
SUT PROLENE 1 CT 1 30 (SUTURE) ×10 IMPLANT
SUT VICRYL 0 UR6 27IN ABS (SUTURE) IMPLANT
TOWEL OR 17X26 10 PK STRL BLUE (TOWEL DISPOSABLE) ×2 IMPLANT
TRAY LAPAROSCOPIC (CUSTOM PROCEDURE TRAY) ×2 IMPLANT
TROCAR 11X100 Z THREAD (TROCAR) IMPLANT
TROCAR ADV FIXATION 5X100MM (TROCAR) ×2 IMPLANT
TROCAR Z-THREAD OPTICAL 5X100M (TROCAR) ×2 IMPLANT

## 2022-06-11 NOTE — Interval H&P Note (Signed)
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  The various methods of treatment have been discussed with the patient and family. After consideration of risks, benefits and other options for treatment, the patient has consented to  Procedure(s): LAPAROSCOPIC VENTRAL HERNIA WITH LYSIS OF ADHESIONS (N/A) REMOVAL OF OLD MESH (N/A) as a surgical intervention.  The patient's history has been reviewed, patient examined, no change in status, stable for surgery.  I have reviewed the patient's chart and labs.  Questions were answered to the patient's satisfaction.    I have re-reviewed the the patient's records, history, medications, and allergies.  I have re-examined the patient.  I again discussed intraoperative plans and goals of post-operative recovery.  The patient agrees to proceed.  Kert Shackett [facility]  1951-02-04 [id number]  Patient Care Team: Shirline Frees, MD as PCP - General (Family Medicine) Josue Hector, MD as PCP - Cardiology (Cardiology) Cameron Sprang, MD as Consulting Physician (Neurology) Lavonna Monarch, MD as Consulting Physician (Dermatology) [name], MD as Consulting Physician (Colon and Rectal Surgery) Clarene Essex, MD as Consulting Physician (Gastroenterology)  Patient Active Problem List   Diagnosis Date Noted   Midline sternotomy scar 03/24/2022   Acute infective endocarditis 11/09/2021   Medication monitoring encounter 11/09/2021   COVID-19    AKI (acute kidney injury) ([place]) 10/27/2021   Streptococcal bacteremia 10/27/2021   Hypomagnesemia 06/13/2021   Hypokalemia 06/13/2021   Colonic diverticular abscess 06/13/2021   Stricture of sigmoid colon ([place]) 06/12/2021   Anxiety 03/19/2021   Diverticulitis of colon 03/19/2021   Gout 03/19/2021   Hardening of the aorta (main artery of the heart) ([place]) 03/19/2021    Hypercoagulable state ([place]) 03/19/2021   Iron deficiency anemia 03/19/2021   Migraine 03/19/2021   Mixed hyperlipidemia 03/19/2021   Personal history of colonic polyps 03/19/2021   Recurrent major depression in remission ([place]) 03/19/2021   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   Acute diverticulitis 10/10/2020   Bilateral shoulder pain 10/26/2019   Morbid obesity due to excess calories ([place]) complicated by HBP, OSA/ gerd  07/07/2019   Upper airway cough syndrome 03/09/2019   Confusion with non-focal neuro exam 02/16/2019   Cough variant asthma vs UACS 11/15/2018   Cough 10/27/2018   Angioedema 03/07/2018   Sepsis ([place]) 03/07/2018   Asthma 03/07/2018   Hypothyroidism 03/07/2018   BPH (benign prostatic hyperplasia) 03/07/2018   Pain in joint of left shoulder 01/26/2018   Allergic reaction 01/21/2018   Atrial fibrillation with RVR ([place]) 07/28/2017   S/P AVR (aortic valve replacement) 07/02/2017   Eczema 01/01/2014   Hoarseness or changing voice 07/24/2013   PAF (paroxysmal atrial fibrillation) ([place]) 05/18/2013   Chronic anticoagulation 05/18/2013   Preop cardiovascular exam 05/18/2013   Macular rash 08/10/2012   Acute asthmatic bronchitis 05/05/2012   Essential hypertension 06/17/2010   SYNCOPE 06/14/2010   DEPRESSION 02/11/2008   Obstructive sleep apnea 02/11/2008   Seasonal and perennial allergic rhinitis 02/11/2008   Chronic bronchitis ([place]) 02/11/2008   GERD (gastroesophageal reflux disease) 02/11/2008    Past Medical History:  Diagnosis Date   Allergic rhinitis    Anxiety    Arthritis    Asthma    Bladder tumor    Chronic fatigue    COVID-19 05/22/2021   Depression    06/30/17 Pt denies being depressed, reports Effexor is taken for Chronic Fatigue    Dyspnea  Enlarged prostate    Fibromyalgia    GERD (gastroesophageal reflux disease)    Headache    History of chronic bronchitis    History of migraine    History of toxic encephalopathy    Hypothyroidism     OSA on CPAP    CPAP 14   PAF (paroxysmal atrial fibrillation) ([place]) CARDIOLOGIST -- DR [name]   DX OCT 2013   Pneumonia    S/P AVR (aortic valve replacement) and aortoplasty    Sensitiveness to light    Unspecified essential hypertension    Urethral tumor    PROSTATIC    Past Surgical History:  Procedure Laterality Date   BENTALL PROCEDURE N/A 07/02/2017   Procedure: BENTALL PROCEDURE;  Surgeon: Ivin Poot, MD;  Location: [place];  Service: Open Heart Surgery;  Laterality: N/A;  WITH CIRC ARREST   BENTALL PROCEDURE  07/02/2017   w/ Aortic valve replacement    by Dr. Prescott Gum   BIOPSY  02/06/2021   Procedure: BIOPSY;  Surgeon: Clarene Essex, MD;  Location: [facility];  Service: Endoscopy;;   BUBBLE STUDY  11/01/2021   Procedure: BUBBLE STUDY;  Surgeon: Geralynn Rile, MD;  Location: [place];  Service: Cardiovascular;;   CARDIOVERSION N/A 11/28/2021   Procedure: CARDIOVERSION (CATH LAB);  Surgeon: Vickie Epley, MD;  Location: [place] [facility];  Service: Cardiovascular;  Laterality: N/A;   COLONOSCOPY     COLONOSCOPY WITH PROPOFOL N/A 02/06/2021   Procedure: COLONOSCOPY WITH PROPOFOL;  Surgeon: Clarene Essex, MD;  [facility];  Service: Endoscopy;  Laterality: N/A;   CORONARY ANGIOPLASTY  06/12/2017   CYSTOSCOPY W/ RETROGRADES Bilateral 06/15/2013   Procedure: CYSTOSCOPY WITH BILATERAL RETROGRADE PYELOGRAM  BLADDER BIOPSY, PROSTATIC URETHRAL BIOPSY, ;  Surgeon: Molli Hazard, MD;  [facility];  Service: Urology;  Laterality: Bilateral;   ESOPHAGOGASTRODUODENOSCOPY (EGD) WITH PROPOFOL N/A 03/11/2018   Procedure: ESOPHAGOGASTRODUODENOSCOPY (EGD) WITH PROPOFOL;  Surgeon: Clarene Essex, MD;  Location: [place];  Service: Endoscopy;  Laterality: N/A;   LAPAROSCOPIC CHOLECYSTECTOMY  01/14/2001   PROCTOSCOPY N/A 06/12/2021   Procedure: RIGID PROCTOSCOPY;  Surgeon: [name], MD;  Location: [facility];  Service: General;   Laterality: N/A;   RIGHT/LEFT HEART CATH AND CORONARY ANGIOGRAPHY N/A 06/12/2017   Procedure: RIGHT/LEFT HEART CATH AND CORONARY ANGIOGRAPHY;  Surgeon: Larey Dresser, MD;  Location: [place] [facility];  Service: Cardiovascular;  Laterality: N/A;   TEE WITHOUT CARDIOVERSION N/A 07/02/2017   Procedure: TRANSESOPHAGEAL ECHOCARDIOGRAM (TEE);  Surgeon: Prescott Gum, Collier Salina, MD;  Location: [place];  Service: Open Heart Surgery;  Laterality: N/A;   TEE WITHOUT CARDIOVERSION N/A 11/01/2021   Procedure: TRANSESOPHAGEAL ECHOCARDIOGRAM (TEE);  Surgeon: Geralynn Rile, MD;  Location: [place];  Service: Cardiovascular;  Laterality: N/A;   UMBILICAL HERNIA REPAIR  01/14/2008   WISDOM TOOTH EXTRACTION      Social History   Socioeconomic History   Marital status: Married    Spouse name: Not on file   Number of children: Not on file   Years of education: Not on file   Highest education level: Not on file  Occupational History   Occupation: disabled    Comment: owner of buisness  Tobacco Use   Smoking status: Former    Packs/day: 0.50    Years: 27.00    Total pack years: 13.50    Types: Cigarettes    Start date: 20    Quit date: 10/21/1979    Years since quitting: 73.6  Smokeless tobacco: Never  Vaping Use   Vaping Use: Never used  Substance and Sexual Activity   Alcohol use: Yes    Alcohol/week: 4.0 standard drinks of alcohol    Types: 4 Cans of beer per week    Comment: drinks "as much as I could" - 2-3 beers up to 8-10   Drug use: Never   Sexual activity: Not Currently  Other Topics Concern   Not on file  Social History Narrative   Right handed   One story home   Drinks no caffeine   Social Determinants of Health   Financial Resource Strain: Unknown (11/22/2021)   Overall Financial Resource Strain (CARDIA)    Difficulty of Paying Living Expenses: Patient refused  Food Insecurity: No Food Insecurity (11/22/2021)   Hunger Vital Sign    Worried About Running Out of Food in  the Last Year: Never true    Ran Out of Food in the Last Year: Never true  Transportation Needs: No Transportation Needs (11/22/2021)   PRAPARE - Hydrologist (Medical): No    Lack of Transportation (Non-Medical): No  Physical Activity: Unknown (11/22/2021)   Exercise Vital Sign    Days of Exercise per Week: 4 days    Minutes of Exercise per Session: Not on file  Stress: Unknown (11/22/2021)   Jud    Feeling of Stress : Patient refused  Social Connections: Unknown (11/22/2021)   Social Connection and Isolation Panel [NHANES]    Frequency of Communication with Friends and Family: More than three times a week    Frequency of Social Gatherings with Friends and Family: Once a week    Attends Religious Services: Patient refused    Marine scientist or Organizations: No    Attends Music therapist: Not on file    Marital Status: Married  Human resources officer Violence: Not on file    Family History  Problem Relation Age of Onset   Aortic aneurysm Mother 4       cause of death   Other Father        motor vehicle accident   Heart disease Other        family history    Facility-Administered Medications Prior to Admission  Medication Dose Route Frequency Provider Last Rate Last Admin   methylPREDNISolone acetate (DEPO-MEDROL) injection (RADIOLOGY ONLY) 120 mg  120 mg Intramuscular Once Maryjane Hurter, MD       Medications Prior to Admission  Medication Sig Dispense Refill Last Dose   acetaminophen (TYLENOL) 500 MG tablet Take 500 mg by mouth 2 (two) times daily as needed for headache (pain).   06/10/2022   albuterol (VENTOLIN HFA) 108 (90 Base) MCG/ACT inhaler INHALE 2 PUFFS INTO THE LUNGS EVERY 6 HOURS AS NEEDED FOR WHEEZING OR SHORTNESS OF BREATH 18 g 5 Past Week   ALPRAZolam (XANAX) 0.25 MG tablet Take 1 tablet (0.25 mg total) by mouth 3 (three) times daily as needed for  anxiety. 15 tablet 0    aluminum hydroxide-magnesium carbonate (GAVISCON) 95-358 MG/15ML SUSP Take 15 mLs by mouth 3 (three) times daily as needed for indigestion or heartburn.   Past Week   azelastine (ASTELIN) 137 MCG/SPRAY nasal spray Place 1-2 sprays into the nose 2 (two) times daily as needed for rhinitis or allergies.    Past Week   benzonatate (TESSALON) 200 MG capsule Take 1 capsule (200 mg total) by mouth 3 (three)  times daily as needed for cough. 60 capsule 3 Past Week   Budeson-Glycopyrrol-Formoterol (BREZTRI AEROSPHERE) 160-9-4.8 MCG/ACT AERO Inhale 2 puffs into the lungs in the morning and at bedtime. 10.7 g 0 Past Week   budesonide-formoterol (SYMBICORT) 160-4.5 MCG/ACT inhaler INHALE 2 PUFFS INTO THE LUNGS IN THE MORNING AND AT BEDTIME 10.2 g 12 Past Week   cetirizine (ZYRTEC) 10 MG tablet Take 10 mg by mouth every morning.   06/10/2022   chlorpheniramine-HYDROcodone (TUSSIONEX PENNKINETIC ER) 10-8 MG/5ML Take 5 mLs by mouth every 12 (twelve) hours as needed for cough. 115 mL 0 Past Week   colchicine (COLCRYS) 0.6 MG tablet Take 0.6 mg by mouth every morning.   06/11/2022 at 0515   dextromethorphan (DELSYM) 30 MG/5ML liquid Take 30 mg by mouth daily as needed for cough.   Past Week   diclofenac Sodium (VOLTAREN) 1 % GEL Apply 1 Application topically at bedtime as needed (arthritis pain.).   Past Week   diphenhydrAMINE (BENADRYL) 25 mg capsule Take 25 mg by mouth daily as needed for allergies.   Past Week   EPINEPHrine 0.3 mg/0.3 mL IJ SOAJ injection Inject 0.3 mLs (0.3 mg total) into the muscle as needed (for angioedema). 2 Device 0    Erenumab-aooe (AIMOVIG) 140 MG/ML SOAJ Inject 140 mg into the skin every 28 (twenty-eight) days. (Patient taking differently: Inject 140 mg into the skin every 28 (twenty-eight) days.) 1 mL 11 06/07/2022   Ferrous Sulfate (IRON) 142 (45 Fe) MG TBCR Take 142 mg by mouth in the morning and at bedtime.   Past Week   finasteride (PROSCAR) 5 MG tablet Take 5 mg  by mouth at bedtime.    06/10/2022   Guaifenesin (MUCINEX MAXIMUM STRENGTH) 1200 MG TB12 Take 1,200 mg by mouth 2 (two) times daily as needed (cough).   Past Week   ibuprofen (ADVIL) 200 MG tablet Take 400 mg by mouth 2 (two) times daily as needed for headache (pain).   06/10/2022   ipratropium (ATROVENT) 0.03 % nasal spray Place 2 sprays into both nostrils 2 (two) times daily as needed for rhinitis.   Past Week   levothyroxine (SYNTHROID, LEVOTHROID) 112 MCG tablet Take 112 mcg by mouth daily before breakfast.    06/11/2022 at 0530   liothyronine (CYTOMEL) 25 MCG tablet Take 25 mcg by mouth daily before breakfast.   Past Week   losartan (COZAAR) 50 MG tablet TAKE 1 TABLET BY MOUTH EVERY DAY (Patient taking differently: Take 50 mg by mouth every morning.) 30 tablet 6 Past Week   Menthol, Topical Analgesic, (BLUE-EMU MAXIMUM STRENGTH EX) Apply 1 application topically 3 (three) times daily as needed (arthritis pain.).   Past Week   Methylcellulose, Laxative, (FIBER THERAPY) 500 MG TABS Take 1,000 mg by mouth in the morning.   Past Week   metoprolol tartrate (LOPRESSOR) 50 MG tablet TAKE 1 TABLET(50 MG) BY MOUTH TWICE DAILY 180 tablet 2 06/11/2022 at 0530   Multiple Vitamin (MULTIVITAMIN WITH MINERALS) TABS tablet Take 1 tablet by mouth in the morning.   Past Week   naproxen sodium (ALEVE) 220 MG tablet Take 220 mg by mouth 2 (two) times daily as needed (pain/headache).   06/10/2022   olopatadine (PATANOL) 0.1 % ophthalmic solution Place 1 drop into both eyes 2 (two) times daily as needed for allergies.   Past Week   omeprazole (PRILOSEC) 40 MG capsule Take 1 capsule (40 mg total) by mouth 2 (two) times daily. 180 capsule 1 06/11/2022 at 0530  PRESCRIPTION MEDICATION Inhale into the lungs at bedtime. CPAP   06/10/2022   rizatriptan (MAXALT) 10 MG tablet Take 1 tablet (10 mg total) by mouth as needed for migraine. May repeat in 2 hours if needed (Patient taking differently: Take 10 mg by mouth See admin  instructions. Take one tablet (10 mg) by mouth at onset of migraine headache, may repeat in 2 hours if needed) 30 tablet 3 Past Week   saccharomyces boulardii (FLORASTOR) 250 MG capsule Take 250 mg by mouth every morning.   Past Week   sodium chloride (OCEAN) 0.65 % SOLN nasal spray Place 1 spray into both nostrils as needed for congestion.   Past Week   tamsulosin (FLOMAX) 0.4 MG CAPS capsule Take 0.4 mg by mouth in the morning and at bedtime.   06/11/2022 at 0530   Testosterone Cypionate 200 MG/ML SOLN Inject 200 mg into the muscle See admin instructions. Inject 1 ml (200 mg) intramuscularly once every 7-10 days   Past Week   venlafaxine (EFFEXOR-XR) 150 MG 24 hr capsule Take 150 mg by mouth at bedtime.   06/11/2022 at 0530   XARELTO 20 MG TABS tablet TAKE 1 TABLET(20 MG) BY MOUTH DAILY WITH SUPPER (Patient taking differently: Take 20 mg by mouth daily with supper.) 90 tablet 1 06/08/2022   zolpidem (AMBIEN) 10 MG tablet Take 10 mg by mouth at bedtime as needed for sleep.   06/10/2022   amoxicillin-clavulanate (AUGMENTIN) 875-125 MG tablet Take 1 tablet by mouth 2 (two) times daily. (Patient not taking: Reported on 05/19/2022) 14 tablet 0 Not Taking    Current Facility-Administered Medications  Medication Dose Route Frequency Provider Last Rate Last Admin   bupivacaine liposome (EXPAREL) 1.3 % injection 266 mg  20 mL Infiltration Once [name], MD       ceFAZolin (ANCEF) IVPB 2g/100 mL premix  2 g Intravenous On Call to OR [name], MD       Chlorhexidine Gluconate Cloth 2 % PADS 6 each  6 each Topical Once [name], MD       And   Chlorhexidine Gluconate Cloth 2 % PADS 6 each  6 each Topical Once [name], MD       feeding supplement (ENSURE PRE-SURGERY) liquid 296 mL  296 mL Oral Once [name], MD       feeding supplement (ENSURE PRE-SURGERY) liquid 592 mL  592 mL Oral Once [name], MD       lactated ringers infusion   Intravenous Continuous Annye Asa, MD 10  mL/hr at 06/11/22 0649 New Bag at 06/11/22 7408     Allergies  Allergen Reactions   Perphenazine Swelling    Tongue swelling   Amiodarone Swelling   Codeine Itching   Prednisone Other (See Comments)    CAN TOLERATE DEPO. PREDNISONE BY MOUTH CAUSES MOOD CHANGES AND ITCHING.    Tramadol Other (See Comments)    Delusions/hallucinations from high doses     BP (!) 145/95   Pulse 72   Temp (!) 97.4 F (36.3 C) (Oral)   Resp 20   SpO2 100%   Labs: No results found for this or any previous visit (from the past 48 hour(s)).  Imaging / Studies: MR BRAIN WO CONTRAST  Result Date: 05/19/2022 CLINICAL DATA:  Altered mental status, left-sided facial pain/numbness EXAM: MRI HEAD WITHOUT CONTRAST MRA HEAD WITHOUT CONTRAST TECHNIQUE: Multiplanar, multi-echo pulse sequences of the brain and surrounding structures were acquired without intravenous contrast. Angiographic images of the [place] were acquired using MRA technique without intravenous contrast. COMPARISON:  10/28/2021 MRI common correlation is also made with 05/19/2022 CT head FINDINGS: MRI HEAD FINDINGS Evaluation is somewhat limited by motion artifact. Brain: No restricted diffusion to suggest acute or subacute infarct. No acute hemorrhage, mass, mass effect, or midline shift. No hydrocephalus or extra-axial collection. 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 Scattered T2 hyperintense signal in the periventricular white matter and pons, likely the sequela of chronic small vessel ischemic disease. Focus of hemosiderin deposition in the left posterior frontal lobe white matter is favored to represent the sequela of prior hypertensive microhemorrhage. Vascular: Normal arterial flow voids. Skull and upper cervical spine: Normal marrow signal. Sinuses/Orbits: Small mucous retention cyst in the right sphenoid sinus. No significant mucosal thickening. The orbits are unremarkable. Other: The mastoids are well aerated. MRA HEAD FINDINGS  Anterior circulation: Both internal carotid arteries are patent to the termini, without significant stenosis. 1 mm outpouching near the right ICA terminus (series 13, image 68) may represent a tiny aneurysm or infundibulum. A1 segments patent. Normal anterior communicating artery. Anterior cerebral arteries are patent to their distal aspects. No M1 stenosis or occlusion. Distal MCA branches perfused and symmetric. Posterior circulation: Motion somewhat limits visualization of the proximal vertebral arteries, vertebrobasilar junction, and proximal basilar artery. Within this limitation, vertebral arteries patent to the vertebrobasilar junction without stenosis. Posterior inferior cerebral arteries are visualized bilaterally. Basilar patent to its distal aspect. Superior cerebellar arteries patent bilaterally. Patent P1 segments. PCAs perfused to their distal aspects without stenosis. The bilateral posterior communicating arteries are not visualized. Anatomic variants: None significant IMPRESSION: 1. Evaluation is somewhat limited by motion artifact. Within this limitation no acute intracranial process. 2. Evaluation is somewhat limited by motion artifact. Within this limitation, no intracranial large vessel occlusion or significant stenosis. 3. 1 mm outpouching near the right ICA terminus, at the expected location of the right posterior communicating artery may represent an infundibulum versus a tiny aneurysm. Electronically Signed   By: Merilyn Baba M.D.   On: 05/19/2022 12:51   MR ANGIO HEAD WO CONTRAST  Result Date: 05/19/2022 CLINICAL DATA:  Altered mental status, left-sided facial pain/numbness EXAM: MRI HEAD WITHOUT CONTRAST MRA HEAD WITHOUT CONTRAST TECHNIQUE: Multiplanar, multi-echo pulse sequences of the brain and surrounding structures were acquired without intravenous contrast. Angiographic images of the Circle of [place] were acquired using MRA technique without intravenous contrast. COMPARISON:   10/28/2021 MRI common correlation is also made with 05/19/2022 CT head FINDINGS: MRI HEAD FINDINGS Evaluation is somewhat limited by motion artifact. Brain: No restricted diffusion to suggest acute or subacute infarct. No acute hemorrhage, mass, mass effect, or midline shift. No hydrocephalus or extra-axial collection. 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 Scattered T2 hyperintense signal in the periventricular white matter and pons, likely the sequela of chronic small vessel ischemic disease. Focus of hemosiderin deposition in the left posterior frontal lobe white matter is favored to represent the sequela of prior hypertensive microhemorrhage. Vascular: Normal arterial flow voids. Skull and upper cervical spine: Normal marrow signal. Sinuses/Orbits: Small mucous retention cyst in the right sphenoid sinus. No significant mucosal thickening. The orbits are unremarkable. Other: The mastoids are well aerated. MRA HEAD FINDINGS Anterior circulation: Both internal carotid arteries are patent to the termini, without significant stenosis. 1 mm outpouching near the right ICA terminus (series 13, image 68) may represent a tiny aneurysm or infundibulum. A1 segments patent. Normal anterior communicating artery. Anterior cerebral arteries are patent to their distal aspects.  No M1 stenosis or occlusion. Distal MCA branches perfused and symmetric. Posterior circulation: Motion somewhat limits visualization of the proximal vertebral arteries, vertebrobasilar junction, and proximal basilar artery. Within this limitation, vertebral arteries patent to the vertebrobasilar junction without stenosis. Posterior inferior cerebral arteries are visualized bilaterally. Basilar patent to its distal aspect. Superior cerebellar arteries patent bilaterally. Patent P1 segments. PCAs perfused to their distal aspects without stenosis. The bilateral posterior communicating arteries are not visualized. Anatomic variants: None significant  IMPRESSION: 1. Evaluation is somewhat limited by motion artifact. Within this limitation no acute intracranial process. 2. Evaluation is somewhat limited by motion artifact. Within this limitation, no intracranial large vessel occlusion or significant stenosis. 3. 1 mm outpouching near the right ICA terminus, at the expected location of the right posterior communicating artery may represent an infundibulum versus a tiny aneurysm. Electronically Signed   By: Merilyn Baba M.D.   On: 05/19/2022 12:51   CT HEAD WO CONTRAST  Result Date: 05/19/2022 CLINICAL DATA:  Altered mental status.  Left-sided facial pain. EXAM: CT HEAD WITHOUT CONTRAST TECHNIQUE: Contiguous axial images were obtained from the base of the skull through the vertex without intravenous contrast. RADIATION DOSE REDUCTION: This exam was performed according to the departmental dose-optimization program which includes automated exposure control, adjustment of the mA and/or kV according to patient size and/or use of iterative reconstruction technique. COMPARISON:  February 16, 2019. FINDINGS: Brain: No evidence of acute infarction, hemorrhage, hydrocephalus, extra-axial collection or mass lesion/mass effect. Vascular: No hyperdense vessel or unexpected calcification. Skull: Normal. Negative for fracture or focal lesion. Sinuses/Orbits: No acute finding. Other: None. IMPRESSION: No acute intracranial abnormality seen. Electronically Signed   By: Marijo Conception M.D.   On: 05/19/2022 10:35   DG Chest 2 View  Result Date: 05/19/2022 CLINICAL DATA:  Altered mental status EXAM: CHEST - 2 VIEW COMPARISON:  Chest x-ray dated March 29, 2022 FINDINGS: Cardiac and mediastinal contours are unchanged post median sternotomy and aortic valve replacement. Unchanged fractures of some of the mediastinal wires. Lungs are clear. No pleural effusion or pneumothorax. IMPRESSION: No active cardiopulmonary disease. Electronically Signed   By: Yetta Glassman M.D.   On:  05/19/2022 09:39     .Adin Hector, M.D., F.A.C.S. Gastrointestinal and Minimally Invasive Surgery [facility] [address], [place], [place] [address] [phone] Main / Paging  06/11/2022 7:46 AM    Adin Hector

## 2022-06-11 NOTE — ED Notes (Signed)
ED Provider at bedside. 

## 2022-06-11 NOTE — Anesthesia Procedure Notes (Signed)
Procedure Name: Intubation Date/Time: 06/11/2022 8:32 AM  Performed by: Eben Burow, CRNAPre-anesthesia Checklist: Patient identified, Emergency Drugs available, Suction available, Patient being monitored and Timeout performed Patient Re-evaluated:Patient Re-evaluated prior to induction Oxygen Delivery Method: Circle system utilized Preoxygenation: Pre-oxygenation with 100% oxygen Induction Type: IV induction Ventilation: Mask ventilation without difficulty Laryngoscope Size: Mac and 4 Grade View: Grade I Tube type: Oral Tube size: 7.5 mm Number of attempts: 1 Airway Equipment and Method: Stylet Placement Confirmation: ETT inserted through vocal cords under direct vision, positive ETCO2 and breath sounds checked- equal and bilateral Secured at: 23 cm Tube secured with: Tape Dental Injury: Teeth and Oropharynx as per pre-operative assessment

## 2022-06-11 NOTE — Discharge Instructions (Signed)
HERNIA REPAIR: POST OP INSTRUCTIONS  ######################################################################  EAT Gradually transition to a high fiber diet with a fiber supplement over the next few weeks after discharge.  Start with a pureed / full liquid diet (see below)  WALK Walk an hour a day.  Control your pain to do that.    CONTROL PAIN Control pain so that you can walk, sleep, tolerate sneezing/coughing, and go up/down stairs.  HAVE A BOWEL MOVEMENT DAILY Keep your bowels regular to avoid problems.  OK to try a laxative to override constipation.  OK to use an antidairrheal to slow down diarrhea.  Call if not better after 2 tries  CALL IF YOU HAVE PROBLEMS/CONCERNS Call if you are still struggling despite following these instructions. Call if you have concerns not answered by these instructions  ######################################################################    DIET: Follow a light bland diet & liquids the first 24 hours after arrival home, such as soup, liquids, starches, etc.  Be sure to drink plenty of fluids.  Quickly advance to a usual solid diet within a few days.  Avoid fast food or heavy meals as your are more likely to get nauseated or have irregular bowels.  A low-fat, high-fiber diet for the rest of your life is ideal.   Take your usually prescribed home medications unless otherwise directed.  PAIN CONTROL: Pain is best controlled by a usual combination of three different methods TOGETHER: Ice/Heat Over the counter pain medication Prescription pain medication Most patients will experience some swelling and bruising around the hernia(s) such as the bellybutton, groins, or old incisions.  Ice packs or heating pads (30-60 minutes up to 6 times a day) will help. Use ice for the first few days to help decrease swelling and bruising, then switch to heat to help relax tight/sore spots and speed recovery.  Some people prefer to use ice alone, heat alone, alternating  between ice & heat.  Experiment to what works for you.  Swelling and bruising can take several weeks to resolve.   It is helpful to take an over-the-counter pain medication regularly for the first few weeks.  Choose one of the following that works best for you: Naproxen (Aleve, etc)  Two 244m tabs twice a day Ibuprofen (Advil, etc) Three 2045mtabs four times a day (every meal & bedtime) Acetaminophen (Tylenol, etc) 325-65043mour times a day (every meal & bedtime) A  prescription for pain medication should be given to you upon discharge.  Take your pain medication as prescribed.  If you are having problems/concerns with the prescription medicine (does not control pain, nausea, vomiting, rash, itching, etc), please call us [phone] see if we need to switch you to a different pain medicine that will work better for you and/or control your side effect better. If you need a refill on your pain medication, please contact your pharmacy.  They will contact our office to request authorization. Prescriptions will not be filled after 5 pm or on week-ends.  Avoid getting constipated.  Between the surgery and the pain medications, it is common to experience some constipation.  Increasing fluid intake and taking a fiber supplement (such as Metamucil, Citrucel, FiberCon, MiraLax, etc) 1-2 times a day regularly will usually help prevent this problem from occurring.  A mild laxative (prune juice, Milk of Magnesia, MiraLax, etc) should be taken according to package directions if there are no bowel movements after 48 hours.    Wash / shower every day.  You may shower over the dressings  as they are waterproof.    It is good for closed incisions and even open wounds to be washed every day.  Shower every day.  Short baths are fine.  Wash the incisions and wounds clean with soap & water.    You may leave closed incisions open to air if it is dry.   You may cover the incision with clean gauze & replace it after  your daily shower for comfort.  TEGADERM & STERISTRIPS:  You have clear gauze band-aid dressings over your closed incision(s).  You also have skin tapes called Steristrips on your incisions.  Leave these in place.  You may trim any edges that curl up with clean scissors.  You may remove all dressings & tapes in the shower in a week.    ACTIVITIES as tolerated:   You may resume regular (light) daily activities beginning the next day--such as daily self-care, walking, climbing stairs--gradually increasing activities as tolerated.  Control your pain so that you can walk an hour a day.  If you can walk 30 minutes without difficulty, it is safe to try more intense activity such as jogging, treadmill, bicycling, low-impact aerobics, swimming, etc. Save the most intensive and strenuous activity for last such as sit-ups, heavy lifting, contact sports, etc  Refrain from any heavy lifting or straining until you are off narcotics for pain control.   DO NOT PUSH THROUGH PAIN.  Let pain be your guide: If it hurts to do something, don't do it.  Pain is your body warning you to avoid that activity for another week until the pain goes down. You may drive when you are no longer taking prescription pain medication, you can comfortably wear a seatbelt, and you can safely maneuver your car and apply brakes. You may have sexual intercourse when it is comfortable.   FOLLOW UP in our office Please call [facility] at [phone] to set up an appointment to see your surgeon in the office for a follow-up appointment approximately 2-3 weeks after your surgery. Make sure that you call for this appointment the day you arrive home to insure a convenient appointment time.  9.  If you have disability of FMLA / Family leave forms, please bring the forms to the office for processing.  (do not give to your surgeon).  WHEN TO CALL us [phone]: Poor pain control Reactions / problems with new medications (rash/itching, nausea,  etc)  Fever over 101.5 F (38.5 C) Inability to urinate Nausea and/or vomiting Worsening swelling or bruising Continued bleeding from incision. Increased pain, redness, or drainage from the incision   The clinic staff is available to answer your questions during regular business hours (8:30am-5pm).  Please don't hesitate to call and ask to speak to one of our nurses for clinical concerns.   If you have a medical emergency, go to the nearest emergency room or call 911.  A surgeon from [facility] is always on call at the hospitals in [facility], [place], [place], [place], [place]  [address] ?  [address] [address], [place], [place]   [address] MAIN: [phone] ? TOLL FREE: [phone] ? FAX: [phone] [email or web address]

## 2022-06-11 NOTE — Anesthesia Postprocedure Evaluation (Signed)
Anesthesia Post Note  Patient: Grant [name]  Procedure(s) Performed: LAPAROSCOPIC VENTRAL HERNIA WITH LYSIS OF ADHESIONS REMOVAL OF OLD MESH     Patient location during evaluation: PACU Anesthesia Type: General Level of consciousness: awake Pain management: pain level controlled Vital Signs Assessment: post-procedure vital signs reviewed and stable Respiratory status: spontaneous breathing, nonlabored ventilation, respiratory function stable and patient connected to nasal cannula oxygen Cardiovascular status: blood pressure returned to baseline and stable Postop Assessment: no apparent nausea or vomiting Anesthetic complications: no   No notable events documented.  Last Vitals:  Vitals:   06/11/22 1215 06/11/22 1230  BP: (!) 158/87 136/88  Pulse: 73 73  Resp: 15 16  Temp:    SpO2: 96% 92%    Last Pain:  Vitals:   06/11/22 1230  TempSrc:   PainSc: 0-No pain                 Falisa Lamora P Edwin Baines

## 2022-06-11 NOTE — Transfer of Care (Signed)
Immediate Anesthesia Transfer of Care Note  Patient: Grant [name]  Procedure(s) Performed: LAPAROSCOPIC VENTRAL HERNIA WITH LYSIS OF ADHESIONS REMOVAL OF OLD MESH  Patient Location: PACU  Anesthesia Type:General  Level of Consciousness: awake, alert  and patient cooperative  Airway & Oxygen Therapy: Patient Spontanous Breathing and Patient connected to face mask oxygen  Post-op Assessment: Report given to RN and Post -op Vital signs reviewed and stable  Post vital signs: Reviewed and stable  Last Vitals:  Vitals Value Taken Time  BP 139/95 06/11/22 1103  Temp    Pulse 80 06/11/22 1106  Resp 20 06/11/22 1106  SpO2 100 % 06/11/22 1106  Vitals shown include unvalidated device data.  Last Pain:  Vitals:   06/11/22 0623  TempSrc: Oral         Complications: No notable events documented.

## 2022-06-11 NOTE — H&P (Signed)
06/11/2022    PROVIDER: Hollace Kinnier, MD  Patient Care Team: Shirline Frees (Inactive) as PCP - Olena Mater, Adrian Saran, MD as Consulting Provider (General Surgery)  [facility] MRN: [id number] DOB: [birth year] DATE OF ENCOUNTER: 04/02/2022  Interval History:   The patient returns to the office after undergoing robotic descending and sigmoid colectomy 06/12/2021   Patient recovered relatively well and went home postoperative day 2.  Pathology consistent with diverticulitis with abscess  Patient comes a with his wife. He is frustrated by the stitches and mesh at his bellybutton. He had an open umbilical hernia repair with a mesh patch in 2009 by Dr. Excell Seltzer who retired. It has been 10 months since I did colectomy. He had a recent CAT scan that disproved any major hernia but the mesh seems folded. He feels like the mesh was come out more. The stitches are just underneath the skin. He has been trying to use pain meds and cover the area with a Band-Aid. It is still very bothersome and frustrating. He wishes to try to do something more aggressive like remove the old mesh and redo repair it. Wondering if that is a possibility. He has not had any new cardiac or pulmonary issues. He remains with sleep apnea but uses CPAP. He is on his blood thinners but otherwise stable. He had no major perioperative complications with his robotic colectomy last year with decent performance status. He is intentionally lost weight and has avoided smoking. He notes he has been struggling with sensitivity at the mesh for many years. He was hoping that it would go down after dealing with the diverticulitis but it is not. He is moving his bowels every day. No nausea vomiting retching.  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 Purulence aspirated and abscess cavity excised en bloc.  No obvious metastatic disease on visceral parietal peritoneum or liver. Patulous rectum with decreased rectal tone overall  The anastomosis rests 14 cm from the anal verge by rigid proctoscopy.  CASE DATA:  Type of patient?: Elective [facility] Private Case  Status of Case? Elective Scheduled  Infection Present At Time Of Surgery (PATOS)? ABSCESS  06/12/2021  Preoperative diagnosis:  Diverticulitis Postoperative diagnosis:  Same  Procedure: Cystoscopy Instillation of ureteral firefly constrast  Surgeon: Ardis Hughs, MD  Anesthesia: General  Complications: None  Intraoperative findings: enlarged prostate with J-hooked ureters.   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 COLON, SIGMOID, RESECTION:  - Diverticulitis with abscess formation.  - No dysplasia or malignancy.   B. FINAL DISTAL MARGIN:  - Benign  colon.   Walda Hertzog DESCRIPTION:   A. Received fresh and subsequently placed in formalin labeled with the  patient's name and "Sigmoid colon" is a 35.6 cm segment of colon with an  average circumference of  6.0 cm and up to 12.2 cm of attached  mesocolon/mesorectum. One end is received open (indicating proximal,  inked blue) while the opposite is stapled (indicating distal, inked  orange). Located 5.5 cm from the proximal resection margin is a 3.9 x  2.3 x 2.0 cm indurated nodule comprised of thickened wall surrounding a  cavity filled with fecal material and red, bloody goo. At the midportion  is a stricture reducing the circumference to 4.5 cm with associated  stiffening. Throughout the otherwise normally folded tan mucosa are a  multitude of diverticular outpouchings, some of which extend through the  wall into the fibrofatty tissue. A discrete mass is not grossly  identified.  Block Summary  A1: proximal and distal margins  A2-A3: proximal area of induration  A4: stricture  A5-A6: representative diverticula   B. Received fresh and subsequently placed in formalin labeled with the  patient's name and "Final distal margin" is a 0.7 cm long, 2.0 cm in  diameter mucosal donut that is grossly unremarkable. Representative  perpendicular sections are submitted in cassette B1.   (LEF 06/13/2021)   Final Diagnosis performed by Vicente Males, MD. Electronically signed  06/14/2021  Technical and / or Professional components performed at [facility], [place] [address], [place], [place] [address].  Immunohistochemistry Technical component (if applicable) was performed  at [facility]. [address] [place],  [place], [place] [address]. IMMUNOHISTOCHEMISTRY DISCLAIMER (if applicable):  Some of these immunohistochemical stains may have been developed and the  performance characteristics determine by [facility]. Some  may not have been cleared or approved by the U.S. Food and Drug  Administration. The FDA has determined that such clearance or approval  is not necessary. This test is used for clinical purposes. It should not  be regarded as  investigational or for research. This laboratory is  certified under the [place]  (CLIA-88) as qualified to perform high complexity clinical laboratory  testing. The controls stained appropriately.   Physical Examination:   Constitutional: Not cachectic. Hygeine adequate. Vitals signs as above.  Eyes: Normal extraocular movements. Sclera nonicteric. Continues with wearing shades given photophobia Neuro: No major focal sensory defects. No major motor deficits. Still with restless fidgeting of legs and upper arms. Psych: No severe agitation. No severe anxiety. Judgment & insight Adequate, Oriented x4, HENT: Normocephalic, Mucus membranes moist. No thrush.  Neck: Supple, No tracheal deviation. No obvious thyromegaly Chest: No pain to chest wall compression. Good respiratory excursion. No audible wheezing CV: Regular rhythm. No major extremity edema  Abdomen: Again has stitches poking under the skin at his umbilicus. Not a total breach but very sensitive. Some swelling. Seems more prolapse/herniated out. No cellulitis or abscess. Rest of his incisions are clean and dry Obese Hernia: Not present. Diastasis recti: Mild supraumbilical midline. Soft. Nondistended. Rest of his abdomen is Nontender.   Gen: Inguinal hernia: Not present. Inguinal lymph nodes: without lymphadenopathy.   Rectal: (Deferred)  Ext: No obvious deformity or contracture. Edema: Not present. No cyanosis Skin: Warm and dry Musculoskeletal: Severe joint rigidity not present.   Assessment and Plan:   Grant [name] is a 71 y.o. male recovering s/p robotic resection of left colon and lyse adhesions..  Diagnoses  and all orders for this visit:  Umbilical pain  History of incisional hernia repair  Diastasis recti    Recovering status post robotic resection of descending and sigmoid colon and abscess 06/12/2021.  Persistent pain irritation at periumbilical mesh with stitch  poking through. Possible hernia recurrence. He has  exhausted nonoperative management. He feels like it is getting worse and is affecting his ability to be active. He has tried pain meds and covering and protecting. He wishes to try and be aggressive and have something done. I offered removal of old mesh with laparoscopic adhesions and placement of a new underlay mesh. I did caution that there is a chance that he can get new pains that the new mesh stitch sites but hopefully we can minimize that at the bellybutton where he is most sensitive. I cautioned I cannot guarantee it solves all of his umbilical and abdominal pains.   They wish to be aggressive and try surgery to remove old mesh and laparoscopically repair the other mesh.   He tolerated the colectomy without much difficulty so I do not feel strongly we need to redo cardiac clearance, just hold blood thinners 2 days before and per usual.  The anatomy & physiology of the abdominal wall was discussed. The pathophysiology of hernias was discussed. Natural history risks without surgery including progeressive enlargement, pain, incarceration, & strangulation was discussed. Contributors to complications such as smoking, obesity, diabetes, prior surgery, etc were discussed.   I feel the risks of no intervention will lead to serious problems that outweigh the operative risks; therefore, I recommended surgery to reduce and repair the hernia. I explained laparoscopic techniques with possible need for an open approach. I noted the probable use of mesh to patch and/or buttress the hernia repair  Risks such as bleeding, infection, abscess, need for further treatment, injury to other organs, need for repair of tissues / organs, stroke, heart attack, death, and other risks were discussed. I noted a good likelihood this will help address the problem. Goals of post-operative recovery were discussed as well. Possibility that this will not correct all symptoms was  explained. I stressed the importance of low-impact activity, aggressive pain control, avoiding constipation, & not pushing through pain to minimize risk of post-operative chronic pain or injury. Possibility of reherniation especially with smoking, obesity, diabetes, immunosuppression, and other health conditions was discussed. We will work to minimize complications.   An educational handout further explaining the pathology & treatment options was given as well. Questions were answered. The patient & his wife express understanding & wishes to proceed with surgery.   Adin Hector, MD, FACS, MASCRS Esophageal, Gastrointestinal & Colorectal Surgery Robotic and Minimally Invasive Surgery  [facility] [address] [place], [place] [phone] [phone] Fax [phone] Main  CONTACT INFORMATION:  Weekday (9AM-5PM): Call [facility] main office at [phone]  Weeknight (5PM-9AM) or Weekend/Holiday: Check [email or web address] (password " [email or web address]") for General Surgery [facility] coverage  (Please, do not use SecureChat as it is not reliable communication to reach operating surgeons for immediate patient care)    06/11/2022

## 2022-06-11 NOTE — ED Triage Notes (Signed)
Pt had his hernia surgery this morning and is now bleeding through the dressings. Pt complains of pain 9/10.

## 2022-06-11 NOTE — ED Provider Notes (Signed)
[facility] DEPT Provider Note   CSN: [id number] Arrival date & time: 06/11/22  1948     History  Chief Complaint  Patient presents with   Post-op Problem    Grant [name] is a 71 y.o. male.  HPI   Patient presents to the ED for evaluation of wound bleeding after surgery today.  Patient has history of obstructive sleep apnea, hypertension, prior colitis, paroxysmal atrial fibrillation, angioedema, morbid obesity.  Patient had outpatient laparoscopic hernia repair today.  Patient noticed that after he was walking around he started having bleeding from the wound around the umbilicus.  Family called EMS.  EMS noted he had a wound dehiscence.  He came to the ED for further evaluation.  Patient has not had any fevers.  No vomiting.  He has been off his anticoagulation for a couple of days  Home Medications Prior to Admission medications   Medication Sig Start Date End Date Taking? Authorizing Provider  acetaminophen (TYLENOL) 500 MG tablet Take 500 mg by mouth 2 (two) times daily as needed for headache (pain).    [provider]  albuterol (VENTOLIN HFA) 108 (90 Base) MCG/ACT inhaler INHALE 2 PUFFS INTO THE LUNGS EVERY 6 HOURS AS NEEDED FOR WHEEZING OR SHORTNESS OF BREATH 09/30/21   Tanda Rockers, MD  ALPRAZolam Duanne Moron) 0.25 MG tablet Take 1 tablet (0.25 mg total) by mouth 3 (three) times daily as needed for anxiety. 10/13/20   Aline August, MD  aluminum hydroxide-magnesium carbonate (GAVISCON) 95-358 MG/15ML SUSP Take 15 mLs by mouth 3 (three) times daily as needed for indigestion or heartburn.    [provider]  amoxicillin-clavulanate (AUGMENTIN) 875-125 MG tablet Take 1 tablet by mouth 2 (two) times daily. Patient not taking: Reported on 05/19/2022 04/17/22   Deneise Lever, MD  azelastine (ASTELIN) 137 MCG/SPRAY nasal spray Place 1-2 sprays into the nose 2 (two) times daily as needed for rhinitis or allergies.     [provider]  benzonatate (TESSALON) 200 MG capsule Take 1 capsule (200 mg total) by mouth 3 (three) times daily as needed for cough. 08/20/21   Parrett, Fonnie Mu, NP  budesonide-formoterol (SYMBICORT) 160-4.5 MCG/ACT inhaler INHALE 2 PUFFS INTO THE LUNGS IN THE MORNING AND AT BEDTIME 06/02/22   Young, Tarri Fuller D, MD  cetirizine (ZYRTEC) 10 MG tablet Take 10 mg by mouth every morning.    [provider]  colchicine (COLCRYS) 0.6 MG tablet Take 0.6 mg by mouth every morning.    [provider]  dextromethorphan (DELSYM) 30 MG/5ML liquid Take 30 mg by mouth daily as needed for cough.    [provider]  diclofenac Sodium (VOLTAREN) 1 % GEL Apply 1 Application topically at bedtime as needed (arthritis pain.).    [provider]  diphenhydrAMINE (BENADRYL) 25 mg capsule Take 25 mg by mouth daily as needed for allergies.    [provider]  EPINEPHrine 0.3 mg/0.3 mL IJ SOAJ injection Inject 0.3 mLs (0.3 mg total) into the muscle as needed (for angioedema). 02/26/18   Josue Hector, MD  Erenumab-aooe (AIMOVIG) 140 MG/ML SOAJ Inject 140 mg into the skin every 28 (twenty-eight) days. Patient taking differently: Inject 140 mg into the skin every 28 (twenty-eight) days. 01/15/22   Cameron Sprang, MD  Ferrous Sulfate (IRON) 142 (45 Fe) MG TBCR Take 142 mg by mouth in the morning and at bedtime.    [provider]  finasteride (PROSCAR) 5 MG tablet Take 5 mg  by mouth at bedtime.  01/14/14   [provider]  Guaifenesin (MUCINEX MAXIMUM STRENGTH) 1200 MG TB12 Take 1,200 mg by mouth 2 (two) times daily as needed (cough).    [provider]  HYDROcodone-acetaminophen (NORCO/VICODIN) 5-325 MG tablet Take 1-2 tablets by mouth every 6 (six) hours as needed for moderate pain or severe pain. 06/11/22   [name], MD  ibuprofen (ADVIL) 200 MG tablet Take 400 mg by mouth 2 (two) times daily as needed for headache (pain).    [provider]   ipratropium (ATROVENT) 0.03 % nasal spray Place 2 sprays into both nostrils 2 (two) times daily as needed for rhinitis.    [provider]  levothyroxine (SYNTHROID, LEVOTHROID) 112 MCG tablet Take 112 mcg by mouth daily before breakfast.     [provider]  liothyronine (CYTOMEL) 25 MCG tablet Take 25 mcg by mouth daily before breakfast.    [provider]  losartan (COZAAR) 50 MG tablet TAKE 1 TABLET BY MOUTH EVERY DAY Patient taking differently: Take 50 mg by mouth every morning. 10/01/20   [name], MD  Menthol, Topical Analgesic, (BLUE-EMU MAXIMUM STRENGTH EX) Apply 1 application topically 3 (three) times daily as needed (arthritis pain.).    [provider]  methocarbamol (ROBAXIN) 750 MG tablet Take 1 tablet (750 mg total) by mouth 4 (four) times daily as needed (use for muscle cramps/pain). 06/11/22   [name], MD  Methylcellulose, Laxative, (FIBER THERAPY) 500 MG TABS Take 1,000 mg by mouth in the morning.    [provider]  metoprolol tartrate (LOPRESSOR) 50 MG tablet TAKE 1 TABLET(50 MG) BY MOUTH TWICE DAILY 05/29/22   Josue Hector, MD  Multiple Vitamin (MULTIVITAMIN WITH MINERALS) TABS tablet Take 1 tablet by mouth in the morning.    [provider]  naproxen sodium (ALEVE) 220 MG tablet Take 220 mg by mouth 2 (two) times daily as needed (pain/headache).    [provider]  olopatadine (PATANOL) 0.1 % ophthalmic solution Place 1 drop into both eyes 2 (two) times daily as needed for allergies.    [provider]  omeprazole (PRILOSEC) 40 MG capsule Take 1 capsule (40 mg total) by mouth 2 (two) times daily. 10/31/16   Josue Hector, MD  PRESCRIPTION MEDICATION Inhale into the lungs at bedtime. CPAP    [provider]  rizatriptan (MAXALT) 10 MG tablet Take 1 tablet (10 mg total) by mouth as needed for migraine. May repeat in 2 hours if needed Patient taking differently: Take 10 mg by mouth See  admin instructions. Take one tablet (10 mg) by mouth at onset of migraine headache, may repeat in 2 hours if needed 01/15/22   Cameron Sprang, MD  saccharomyces boulardii (FLORASTOR) 250 MG capsule Take 250 mg by mouth every morning.    [provider]  sodium chloride (OCEAN) 0.65 % SOLN nasal spray Place 1 spray into both nostrils as needed for congestion.    [provider]  tamsulosin (FLOMAX) 0.4 MG CAPS capsule Take 0.4 mg by mouth in the morning and at bedtime.    [provider]  Testosterone Cypionate 200 MG/ML SOLN Inject 200 mg into the muscle See admin instructions. Inject 1 ml (200 mg) intramuscularly once every 7-10 days    [provider]  venlafaxine (EFFEXOR-XR) 150 MG 24 hr capsule Take 150 mg by mouth at bedtime.    [provider]  XARELTO 20 MG TABS tablet TAKE 1 TABLET(20 MG)  BY MOUTH DAILY WITH SUPPER Patient taking differently: Take 20 mg by mouth daily with supper. 05/12/22   Josue Hector, MD  zolpidem (AMBIEN) 10 MG tablet Take 10 mg by mouth at bedtime as needed for sleep. 11/06/21   [provider]      Allergies    Perphenazine, Amiodarone, Codeine, Prednisone, and Tramadol    Review of Systems   Review of Systems  Physical Exam Updated Vital Signs BP 120/71 (BP Location: Left Arm)   Pulse 88   Temp 98.4 F (36.9 C) (Oral)   Resp 18   Ht 1.778 m ('5\' 10"'$ )   Wt 108.9 kg   SpO2 95%   BMI 34.44 kg/m  Physical Exam Vitals and nursing note reviewed.  Constitutional:      General: He is not in acute distress.    Appearance: He is well-developed.  HENT:     Head: Normocephalic and atraumatic.     Right Ear: External ear normal.     Left Ear: External ear normal.  Eyes:     General: No scleral icterus.       Right eye: No discharge.        Left eye: No discharge.     Conjunctiva/sclera: Conjunctivae normal.  Neck:     Trachea: No tracheal deviation.  Cardiovascular:     Rate and Rhythm: Normal  rate and regular rhythm.  Pulmonary:     Effort: Pulmonary effort is normal. No respiratory distress.     Breath sounds: No stridor.  Abdominal:     General: There is no distension.     Comments: Protuberant, no tenderness, wound dehiscence noted around the periumbilical wound.  No active bleeding but some dark blood noted .  Area of dehiscence is approximately 3 cm in length.  The dehiscence only involves the dermis  Musculoskeletal:        General: No swelling or deformity.     Cervical back: Neck supple.  Skin:    General: Skin is warm and dry.     Findings: No rash.  Neurological:     Mental Status: He is alert.     Cranial Nerves: Cranial nerve deficit: no gross deficits.     ED Results / Procedures / Treatments   Labs (all labs ordered are listed, but only abnormal results are displayed) Labs Reviewed  CBC - Abnormal; Notable for the following components:      Result Value   WBC 15.0 (*)    All other components within normal limits  BASIC METABOLIC PANEL - Abnormal; Notable for the following components:   Glucose, Bld 157 (*)    Calcium 8.3 (*)    All other components within normal limits    EKG None  Radiology No results found.  Procedures .Marland KitchenLaceration Repair  Date/Time: 06/11/2022 9:07 PM  Performed by: Dorie Rank, MD Authorized by: Dorie Rank, MD   Consent:    Consent obtained:  Verbal   Consent given by:  Patient   Risks discussed:  Infection, need for additional repair, pain, poor cosmetic result and poor wound healing   Alternatives discussed:  No treatment and delayed treatment Universal protocol:    Procedure explained and questions answered to patient or proxy's satisfaction: yes     Relevant documents present and verified: yes     Test results available: yes     Imaging studies available: yes     Required blood products, implants, devices, and special equipment available: yes  Site/side marked: yes     Immediately prior to procedure, a time  out was called: yes     Patient identity confirmed:  Verbally with patient Anesthesia:    Anesthesia method:  Local infiltration   Local anesthetic:  Lidocaine 1% WITH epi Laceration details:    Location: Abdominal wall.   Length (cm):  5 Pre-procedure details:    Preparation:  Patient was prepped and draped in usual sterile fashion Treatment:    Area cleansed with:  Povidone-iodine   Amount of cleaning:  Standard Skin repair:    Repair method:  Sutures   Suture size:  3-0   Suture material:  Chromic gut   Suture technique:  Simple interrupted   Number of sutures:  5 Approximation:    Approximation:  Close Repair type:    Repair type:  Simple Post-procedure details:    Dressing:  Sterile dressing   Procedure completion:  Tolerated well, no immediate complications     Medications Ordered in ED Medications  lidocaine-EPINEPHrine (XYLOCAINE W/EPI) 2 %-1:200000 (PF) injection 10 mL (10 mLs Infiltration Given 06/11/22 2022)  HYDROcodone-acetaminophen (NORCO/VICODIN) 5-325 MG per tablet 1 tablet (1 tablet Oral Given 06/11/22 2108)    ED Course/ Medical Decision Making/ A&P Clinical Course as of 06/11/22 2157  Wed Jun 11, 2022  2147 CBC(!) White blood cell count elevated [JK]  2119 Basic metabolic panel(!) Hyperglycemia noted on metabolic panel [JK]    Clinical Course User Index [JK] Dorie Rank, MD                           Medical Decision Making Problems Addressed: Wound dehiscence: acute illness or injury that poses a threat to life or bodily functions  Amount and/or Complexity of Data Reviewed Labs: ordered. Decision-making details documented in ED Course. Discussion of management or test interpretation with external provider(s): Wife called the on-call physician Dr. Brantley Stage while I was actually in the room.  Dr. Brantley Stage agreed with suturing of the wound .  Patient will follow-up in the general surgery clinic  Risk Prescription drug management.   Patient noted to  have wound dehiscence of the skin at his recent surgical site.  Underlying structures intact.  No active bleeding noted on my exam.  Sutures placed and there is no further bleeding.  Patient's labs are unremarkable.  Evaluation and diagnostic testing in the emergency department does not suggest an emergent condition requiring admission or immediate intervention beyond what has been performed at this time.  The patient is safe for discharge and has been instructed to return immediately for worsening symptoms, change in symptoms or any other concerns.         Final Clinical Impression(s) / ED Diagnoses Final diagnoses:  Wound dehiscence    Rx / DC Orders ED Discharge Orders     None         Dorie Rank, MD 06/11/22 2157

## 2022-06-11 NOTE — Op Note (Signed)
06/11/2022  PATIENT:  Grant [name]  71 y.o. 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  POST-OPERATIVE DIAGNOSIS:   PERIUMBILICAL EVENTRATION OF OLD MESH PARTIAL OBSTRUCTION / CLOSED LOOP BOWEL   PROCEDURE:   REMOVAL OF OLD MESH LAPAROSCOPIC REPAIR OF ABDOMINAL HERNIA WITH MESH  (See OR Findings below) LAPAROSCOPIC LYSIS OF ADHESIONS TAP BLOCK - BILATERAL  SURGEON:  Adin Hector, MD  ASSISTANT: Nurse   ANESTHESIA:     General  Regional TRANSVERSUS ABDOMINIS PLANE (TAP) nerve block for perioperative & postoperative pain control provided with liposomal bupivacaine (Experel) mixed with 0.25% bupivacaine as a Bilateral TAP block x 80m each side at the level of the transverse abdominis & preperitoneal spaces along the flank at the anterior axillary line, from subcostal ridge to iliac crest under laparoscopic guidance   EBL:  Total I/O In: 1548.4 [I.V.:1448.4; IV Piggyback:100] Out: 30 [Blood:30]  Per anesthesia record  Delay start of Pharmacological VTE agent (>24hrs) due to surgical blood loss or risk of bleeding:  no  DRAINS: none   SPECIMEN: Old mesh sent for culture and pathology.  COUNTS:  YES  PLAN OF CARE: Discharge to home after PACU  PATIENT DISPOSITION:  PACU - hemodynamically stable.  INDICATION: Pleasant patient with distant history of periumbilical hernia repair with mesh.  [facility] with abdominal pain.  Persistent.  Sharp periumbilical pain with permanent stitches causing  discomfort.  Refractory to anti-inflammatory and pain regimen.  I offered mesh removal and possible replacement of new mesh/repair.    The anatomy & physiology of the abdominal wall was discussed. The pathophysiology of hernias was discussed. Natural history risks without surgery including progeressive enlargement, pain, incarceration & strangulation was discussed. Contributors to complications such as smoking, obesity, diabetes, prior surgery, etc were discussed.  I feel the risks of no intervention will lead to serious problems that outweigh the operative risks; therefore, I recommended surgery to reduce and repair the hernia. I explained laparoscopic techniques with possible need for an open approach. I noted the probable use of mesh to patch and/or buttress the hernia repair.  Risks such as bleeding, infection, abscess, need for further treatment, heart attack, death, and other risks were discussed. I noted a good likelihood this will help address the problem.  I did caution there is a chance he could resume recurrent abdominal pain in the setting of the mesh.  Goals of post-operative recovery were discussed as well. Possibility that this will not correct all symptoms was explained. I stressed the importance of low-impact activity, aggressive pain control, avoiding constipation, & not pushing through pain to minimize risk of post-operative chronic pain or injury. Possibility of reherniation especially with smoking, obesity, diabetes, immunosuppression, and other health conditions was discussed. We will work to minimize complications.   An educational handout further explaining the pathology & treatment options was given as well. Questions were answered. The patient expresses understanding & wishes to proceed with surgery.    OR FINDINGS: Incorporated periumbilical polypropylene small disc mesh with some eventration through his thin abdominal wall and diastases.  No obvious evidence of infection or  abscess.  Dense adhesions of small bowel to the old mesh.  Dense pockets of adhesions.  Small bowel appeared to be twisting upon itself.  Also moderate omental adhesions down the pelvis.  Interloop adhesions as well as down into the pelvis.  1 region suspicious for a closed-loop partial bowel obstruction situation.  Hernia location: Periumbilical  Hernia type: Recurrent incisional - Reducible  Hernia size - largest dimension 3cm x 3cm  Type of repair: Laparoscopic underlay repair.  Primary repair of largest hernia   Placement of mesh: Intraperitoneal underlay repair  Name of mesh: Bard Ventralight dual sided (polypropylene / Seprafilm)  Size of mesh: 15x10cm  Orientation: Transverse  Mesh overlap:  5cm   DESCRIPTION:   Informed consent was confirmed. The patient underwent general anaesthesia without difficulty. The patient was positioned appropriately. VTE prevention in place. The patient's abdomen was clipped, prepped, & draped in a sterile fashion. Surgical timeout confirmed our plan.  The patient was positioned in reverse Trendelenburg. Abdominal entry was gained using optical entry technique in the left upper abdomen. Entry was clean. I induced carbon dioxide insufflation. Camera inspection revealed no injury. Extra ports were carefully placed under direct laparoscopic visualization.   I could see adehsions on the parietal peritoneum under the abdominal wall.  I did laparoscopic lysis of adhesions to expose the entire anterior abdominal wall.  I primarily used focused sharp dissection.  Had to free some omental adhesions on the left lower quadrant and pelvis to better expose the small bowel.  Dense small bowel adhesions freed off.  Interloop adhesions were noted.  I ran the small bowel from the ileocecal valve towards the ligament of Treitz.  Encountered another area of dense adhesions of mid jejunum to the retroperitoneum.  That was sort of trapping some of the small bowel as well.   Suspicious for 2 regions of either partial twisting and/or closed-loop situation.  Change in caliber.  Once all adhesions were released seen things to look much better overall.  I we ran the small bowel and made sure it laid well improved no change in caliber or concern.  No serosal injury or other concern.  Reassuring.  Patient had a small disc of thin UltraPro appearing mesh.  It appeared to be well incorporated.  I carefully freed it off the preperitoneal space laparoscopically to come around the edge.  Could see some erosion centrally as well as some eventration suspicious for possible mesh herniation/eventration.  Encountered no purulence nor abscess.  I made an infraumbilical curvilinear incision and remove the mesh and inspected it.  No definite purulence.  I did trim most suspicious area and sent that off for culture and the rest for pathology.  Hopefully no surprises there but wish to be safe.  Given his morbid obesity, significant diastases eventration and thin abdominal wall, I was skeptical that primary repair only would be adequate to prevent hernia recurrence.  Therefore I felt it would be reasonable to do mesh replacement.  Chose a more standard weight polypropylene mesh as opposed to the presumed thinner UltraPro mesh for more durable buttress.  I made sure hemostasis was good.  I mapped out the region using a needle passer.   To ensure that I would have at least 5 cm radial coverage outside of the hernia defect, I chose a 15x10cm dual sided mesh.  I placed #1 Prolene stitches around its edge about every 5 cm = 8 total.  I rolled the mesh & placed into the peritoneal cavity through the  hernia defect.  I unrolled the mesh and positioned it appropriately.  I secured the mesh to cover up the hernia defect using a laparoscopic suture passer to pass the tails of the Prolene through the abdominal wall & tagged them with clamps for good transfascial suturing.  I started out in four corners to make  sure I had the mesh centered under the hernia defect appropriately, and then proceeded to work in quadrants.    We evacuated CO2 & desufflated the abdomen.  I tied the fascial stitches down. I closed the fascial defect that I placed the mesh through using #1 PDS interrupted transverse stitches primarily.  I reinsufflated the abdomen. The mesh provided at least circumferential coverage around the entire region of hernia defects.  I secured the mesh centrally with an additional trans fascial stitch in & out the mesh using  0 vicryl  under laparoscopic visualization.   I tacked the edges & central part of the mesh to the peritoneum/posterior rectus fascia with SecureStrap absorbable tacks.   I did reinspection. Hemostasis was good. Mesh laid well. I completed a broad field block of local anesthesia at fascial stitch sites & fascial closure areas.    Capnoperitoneum was evacuated. Ports were removed.  Make sure to all the PDS sutures around the bellybutton or tucked back in.  I trimmed down the small hernia sac and rechecked the umbilical stalk down to the fascia.  Used 0 Vicryl suture to also buttress the the repair.  Also try and cover up any of the knots to protect it and hopefully not know him as much.  The skin was closed with Monocryl at the port sites and Steri-Strips on the fascial stitch puncture sites.  Patient is being extubated to go to the recovery room.  I discussed operative findings, updated the patient's status, discussed probable steps to recovery, and gave postoperative recommendations to the patient's spouse.  Recommendations were made.  Questions were answered.  She expressed understanding & appreciation.  Adin Hector, M.D., F.A.C.S. Gastrointestinal and Minimally Invasive Surgery [facility] [address] [place], [place] [address] [phone] Main / Paging  06/11/2022 11:27 AM

## 2022-06-11 NOTE — Discharge Instructions (Addendum)
Follow-up with your surgeon as planned.  Continue your current medications.  The sutures that were placed today will dissolve on their own

## 2022-06-12 ENCOUNTER — Encounter (HOSPITAL_COMMUNITY): Payer: Self-pay | Admitting: Surgery

## 2022-06-12 LAB — SURGICAL PATHOLOGY

## 2022-06-13 DIAGNOSIS — K430 Incisional hernia with obstruction, without gangrene: Secondary | ICD-10-CM | POA: Diagnosis not present

## 2022-06-13 DIAGNOSIS — Z9889 Other specified postprocedural states: Secondary | ICD-10-CM | POA: Diagnosis not present

## 2022-06-13 DIAGNOSIS — Z8719 Personal history of other diseases of the digestive system: Secondary | ICD-10-CM | POA: Diagnosis not present

## 2022-06-15 DIAGNOSIS — G4733 Obstructive sleep apnea (adult) (pediatric): Secondary | ICD-10-CM | POA: Diagnosis not present

## 2022-06-16 LAB — AEROBIC/ANAEROBIC CULTURE W GRAM STAIN (SURGICAL/DEEP WOUND)
Culture: NO GROWTH
Gram Stain: NONE SEEN

## 2022-06-21 DIAGNOSIS — G4733 Obstructive sleep apnea (adult) (pediatric): Secondary | ICD-10-CM | POA: Diagnosis not present

## 2022-06-30 DIAGNOSIS — Z8719 Personal history of other diseases of the digestive system: Secondary | ICD-10-CM | POA: Diagnosis not present

## 2022-06-30 DIAGNOSIS — Z9889 Other specified postprocedural states: Secondary | ICD-10-CM | POA: Diagnosis not present

## 2022-06-30 DIAGNOSIS — R1033 Periumbilical pain: Secondary | ICD-10-CM | POA: Diagnosis not present

## 2022-06-30 DIAGNOSIS — K660 Peritoneal adhesions (postprocedural) (postinfection): Secondary | ICD-10-CM | POA: Diagnosis not present

## 2022-07-01 ENCOUNTER — Encounter: Payer: Self-pay | Admitting: Cardiovascular Disease

## 2022-07-02 ENCOUNTER — Encounter: Payer: Self-pay | Admitting: Internal Medicine

## 2022-07-04 NOTE — Telephone Encounter (Signed)
Dr. Annamaria Boots, please advise on pt email regarding vaccines. It is pasted below:  Good Morning [name].     i have Grant [name] scheduled for RSV vaccination Thurs 9/14. I then will schedule COVID and Flu next week or as soon as the new vaccine is at [facility]. Is there any reason he should not have any of these? He has had all covid and flu vacs in past.   Thank you,   [name] [phone]

## 2022-07-04 NOTE — Telephone Encounter (Signed)
He should be fine to get those vaccines.

## 2022-07-06 NOTE — Progress Notes (Unsigned)
History of Present Illness: . male former smoker with asthma/chronic bronchitis and allergic rhinitis, OSA PAF/ anticoagulation, aortic valve replacement, toxic encephalopathy, light sensitivity, chronic fatigue/fibromyalgia NPSG 03/24/03- AHI 88/ hr, desaturation to 75%, body weight 244 lbs PFT 06/01/17-WNL-FVC 4.46/96%, FEV1 3.71/106%, ratio 0.83, FEF 25-75% 4.64/167%, no response to dilator, TLC 107%, DLCO 101%  ------------------------------------------------------------------   04/28/22-  71 year old male former smoker followed for Asthma/chronic Bronchitis and allergic Rhinitis,  OSA,  complicated by PAF/ anticoagulation, Aortic Valve Replacement,/ IE abscess, Toxic Encephalopathy, Light sensitivity, Chronic Fatigue/fibromyalgia -Flutter valve, Symbicort 160, Astelin nasal, tessalon, singulair, Ventolin hfa,  CPAP auto 5-15/[employer]   AirSense 10 AutoSet Download compliance-77%, AHI 5.7/ hr Body weight today- On 7/3 wife reported episode with low O2 sat 70's and HR 30-50 range. Pending PFT CBC 6/29- lymphs 9.6, monos 12.8 H He and his wife report bronchitis somewhat better.  Cough now only productive scant clear sputum, no longer green.  He had a few days of significant improvement after Depo injection and antibiotic but has been on antibiotics repeatedly over the last couple of months with no lasting effect.  Currently on Mucinex and Tessalon.  Sleeping with head of bed elevated at on pillows.  He minimizes symptoms of reflux. On the afternoon of his last office visit here, with episode of hard coughing his oxygen saturation and heart rate both dropped temporarily and wife thought he seemed confused.  She put him on CPAP for most of the day.  This happened 1 other time.  By description it sounds like near tussive syncope.  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  07/07/22-  71 year old male former smoker  (13.5 pkyrs) followed for Asthma/chronic Bronchitis and allergic Rhinitis,  OSA,  complicated by PAF/ anticoagulation, Aortic Valve Replacement,/ IE abscess, Toxic Encephalopathy, Light sensitivity, Chronic Fatigue/fibromyalgia -Flutter valve, Symbicort 160, Astelin nasal, tessalon, singulair, Ventolin hfa,  CPAP auto 5-15/[employer]   AirSense 10 AutoSet Download compliance- 97%, AHI 5.5/ hr Body weight today- Had repair abd hernia  PFT 07/07/22-  ------Pt f/u osa had Pft prior to appt. Everything seems to be going well, machine is working well 10hrs/night of sleep. Recent operation on 8/23 to remove old mesh. Plans RSV and Flu vax tomorrow at drug store. He had RSV vaccine yesterday and plans COVID and flu vaccines tomorrow.  Breathing is doing much better with much less cough.  He stopped Symbicort because he thought it was making him cough.  Now using rescue inhaler once or twice a week.  He says blowing effort for PFT today did not hurt his hernia repair incision. Comfortable with CPAP with no problems identified.  They brought in old machine to donate and it is given to our [facility] office. CXR 05/19/22- FINDINGS: Cardiac and mediastinal contours are unchanged post median sternotomy and aortic valve replacement. Unchanged fractures of some of the mediastinal wires. Lungs are clear. No pleural effusion or pneumothorax. IMPRESSION: No active cardiopulmonary disease.  ROS-see HPI     + = positive Constitutional:   No-   weight loss, night sweats, fevers, chills, fatigue, lassitude. HEENT:   No-  headaches, difficulty swallowing, tooth/dental problems,  sore throat,       No-  sneezing, itching, ear ache,                    No-nasal congestion, + post nasal drip,  CV:  +chest pain, orthopnea, PND, swelling in  lower extremities, anasarca,  dizziness, palpitations Resp: No- coughing up of blood.               change in color of mucus.  No- wheezing.   Skin: Clear GI:   heartburn, indigestion,  abdominal pain, nausea, vomiting,  GU:  MS:  No-   joint pain or swelling.   Neuro-     nothing unusual Psych:  No- change in mood or affect. + depression or anxiety.  No memory loss.  OBJ- Physical Exam Sunglasses, fidgeting/ restless General- Alert, Oriented, Affect-appropriate, Distress- none acute.  Skin- +eczematoid patches on lower calves, nonspecific, excoriated Lymphadenopathy- none Head- atraumatic. Stares aimlessly around room like a blind person.             Eyes- Gross vision intact, PERRLA, conjunctivae and secretions clear,  + sunglasses            Ears- Hearing, canals-normal            Nose- Clear, no-Septal dev, mucus, polyps, erosion, perforation             Throat- Mallampati II-III, mucosa , drainage- none, tonsils- atrophic Neck- flexible , trachea midline, no stridor , thyroid nl, carotid no bruit Chest - symmetrical excursion , unlabored           Heart/CV- RRR ,  murmur+2-3/6 , no gallop  , no rub, nl s1 s2,                            - JVD- none , edema- none, stasis changes- none, varices- none           Lung- + clear/ unlabored, wheeze- none, cough+"bronchitic", dullness-none, rub- none           Chest wall-  Abd-  Br/ Gen/ Rectal- Not done, not indicated Extrem- cyanosis- none, clubbing, none, atrophy- none, strength- nl Neuro- grossly intact to observation

## 2022-07-07 ENCOUNTER — Ambulatory Visit (INDEPENDENT_AMBULATORY_CARE_PROVIDER_SITE_OTHER): Payer: Medicare Other | Admitting: Internal Medicine

## 2022-07-07 ENCOUNTER — Ambulatory Visit: Payer: Medicare Other | Admitting: Internal Medicine

## 2022-07-07 ENCOUNTER — Encounter: Payer: Self-pay | Admitting: Internal Medicine

## 2022-07-07 DIAGNOSIS — G4733 Obstructive sleep apnea (adult) (pediatric): Secondary | ICD-10-CM | POA: Diagnosis not present

## 2022-07-07 DIAGNOSIS — Z8719 Personal history of other diseases of the digestive system: Secondary | ICD-10-CM | POA: Diagnosis not present

## 2022-07-07 DIAGNOSIS — R1084 Generalized abdominal pain: Secondary | ICD-10-CM | POA: Diagnosis not present

## 2022-07-07 DIAGNOSIS — Z9889 Other specified postprocedural states: Secondary | ICD-10-CM | POA: Diagnosis not present

## 2022-07-07 DIAGNOSIS — J45909 Unspecified asthma, uncomplicated: Secondary | ICD-10-CM | POA: Diagnosis not present

## 2022-07-07 DIAGNOSIS — J45991 Cough variant asthma: Secondary | ICD-10-CM

## 2022-07-07 LAB — PULMONARY FUNCTION TEST
DL/VA % pred: 101 %
DL/VA: 4.14 ml/min/mmHg/L
DLCO cor % pred: 111 %
DLCO cor: 29.2 ml/min/mmHg
DLCO unc % pred: 111 %
DLCO unc: 29.2 ml/min/mmHg
FEF 25-75 Post: 3.49 L/sec
FEF 25-75 Pre: 3.16 L/sec
FEF2575-%Change-Post: 10 %
FEF2575-%Pred-Post: 138 %
FEF2575-%Pred-Pre: 125 %
FEV1-%Change-Post: 2 %
FEV1-%Pred-Post: 113 %
FEV1-%Pred-Pre: 110 %
FEV1-Post: 3.73 L
FEV1-Pre: 3.65 L
FEV1FVC-%Change-Post: 3 %
FEV1FVC-%Pred-Pre: 105 %
FEV6-%Change-Post: 0 %
FEV6-%Pred-Post: 110 %
FEV6-%Pred-Pre: 111 %
FEV6-Post: 4.64 L
FEV6-Pre: 4.68 L
FEV6FVC-%Pred-Post: 105 %
FEV6FVC-%Pred-Pre: 105 %
FVC-%Change-Post: 0 %
FVC-%Pred-Post: 104 %
FVC-%Pred-Pre: 104 %
FVC-Post: 4.64 L
FVC-Pre: 4.68 L
Post FEV1/FVC ratio: 80 %
Post FEV6/FVC ratio: 100 %
Pre FEV1/FVC ratio: 78 %
Pre FEV6/FVC Ratio: 100 %
RV % pred: 151 %
RV: 3.67 L
TLC % pred: 123 %
TLC: 8.65 L

## 2022-07-07 MED ORDER — ALBUTEROL SULFATE HFA 108 (90 BASE) MCG/ACT IN AERS: INHALATION_SPRAY | RESPIRATORY_TRACT | 12 refills | Status: DC

## 2022-07-07 NOTE — Patient Instructions (Signed)
Full PFT performed today. °

## 2022-07-07 NOTE — Patient Instructions (Addendum)
Ventolin refill sent  Please call if we can help

## 2022-07-07 NOTE — Assessment & Plan Note (Signed)
Benefits from CPAP with good compliance and control Plan- continue auto 5-15 

## 2022-07-07 NOTE — Assessment & Plan Note (Signed)
He reportsSymbicort made cough worse,so not using it. Wife says rescue albuterol occasionally is sufficient now.

## 2022-07-07 NOTE — Progress Notes (Signed)
Full PFT performed today. °

## 2022-07-11 ENCOUNTER — Telehealth: Payer: Self-pay

## 2022-07-11 NOTE — Patient Outreach (Signed)
  Care Coordination   Initial Visit Note   07/11/2022 Name: Grant [name] MRN: [id number] DOB: February 03, 1951  Grant [name] is a 71 y.o. year old male who sees Grant Frees, MD for primary care. I  spoke with DPR spouse Grant [name]  What matters to the patients health and wellness today?   none     Goals Addressed             This Visit's Progress    COMPLETED: Care Coordination Activities-No follow up required       Care Coordination Interventions: Advised patient to schedule annual wellness exam.            SDOH assessments and interventions completed:  No     Care Coordination Interventions Activated:  Yes  Care Coordination Interventions:  Yes, provided   Follow up plan: No further intervention required.   Encounter Outcome:  Pt. Visit Completed   Grant Baseman, RN, MSN [facility] Care Management Coordinator Direct Line [phone]

## 2022-07-11 NOTE — Patient Instructions (Signed)
Visit Information  Thank you for taking time to visit with me today. Please don't hesitate to contact me if I can be of assistance to you.   Following are the goals we discussed today:   Goals Addressed             This Visit's Progress    COMPLETED: Care Coordination Activities-No follow up required       Care Coordination Interventions: Advised patient to schedule annual wellness exam.            If you are experiencing a Mental Health or [place] or need someone to talk to, please call the Suicide and Crisis Lifeline: [phone]   Patient verbalizes understanding of instructions and care plan provided today and agrees to view in [place]. Active MyChart status and patient understanding of how to access instructions and care plan via MyChart confirmed with patient.     No further follow up required: declined  Jone Baseman, RN, MSN [facility] Care Management Coordinator Direct Line [phone]

## 2022-07-16 DIAGNOSIS — G4733 Obstructive sleep apnea (adult) (pediatric): Secondary | ICD-10-CM | POA: Diagnosis not present

## 2022-07-22 ENCOUNTER — Telehealth: Payer: Self-pay | Admitting: Internal Medicine

## 2022-07-22 NOTE — Telephone Encounter (Signed)
States that they are out of town and that patient is having bronchitis flair. This began about 8 days ago. Has been taking amoxycillan, but is not helping. Would like something called in to alleviate symptoms. [facility], [place].

## 2022-07-22 NOTE — Telephone Encounter (Signed)
Called and spoke with patient's wife. She stated that she believes the patient is in the middle of a bronchitis flare up. He has a productive cough with thick green phlegm. Increased wheezing. Per patient's wife, this has been going on for about a week now.   She had some left over amoxicillin and give it to the patient. So far it has not helped. She wanted to know if CY would be willing to send in a round of doxycycline.   Pharmacy is [place].   Dr. Annamaria Boots, can you please advise? Thanks!

## 2022-07-23 MED ORDER — DOXYCYCLINE HYCLATE 100 MG PO TABS: 100.0000 mg | ORAL_TABLET | Freq: Two times a day (BID) | ORAL | 1 refills | Status: DC

## 2022-07-23 NOTE — Telephone Encounter (Signed)
Attempted to call pt's spouse Juliann Pulse but  unable to reach. Left her a detailed message letting her know that CY refilled pt's doxy. Nothing further needed.

## 2022-07-23 NOTE — Telephone Encounter (Signed)
Doxycycline sent with one refill if needed

## 2022-08-04 ENCOUNTER — Encounter: Payer: Self-pay | Admitting: Internal Medicine

## 2022-08-04 MED ORDER — AZITHROMYCIN 250 MG PO TABS
250.0000 mg | ORAL_TABLET | Freq: Every day | ORAL | 0 refills | Status: DC
Start: 2022-08-04 — End: 2022-08-18

## 2022-08-04 NOTE — Telephone Encounter (Signed)
Please send him ZPAK 250 mg, # 6, 2 today then one daily to use instead of doxycycline

## 2022-08-05 ENCOUNTER — Ambulatory Visit: Payer: Medicare Other | Admitting: Neurology

## 2022-08-15 DIAGNOSIS — G4733 Obstructive sleep apnea (adult) (pediatric): Secondary | ICD-10-CM | POA: Diagnosis not present

## 2022-08-18 ENCOUNTER — Telehealth: Payer: Self-pay | Admitting: Internal Medicine

## 2022-08-18 ENCOUNTER — Encounter: Payer: Self-pay | Admitting: Internal Medicine

## 2022-08-18 ENCOUNTER — Ambulatory Visit: Payer: Medicare Other | Admitting: Internal Medicine

## 2022-08-18 VITALS — BP 142/84 | HR 89 | Ht 70.0 in | Wt 254.4 lb

## 2022-08-18 DIAGNOSIS — J4520 Mild intermittent asthma, uncomplicated: Secondary | ICD-10-CM | POA: Diagnosis not present

## 2022-08-18 DIAGNOSIS — J42 Unspecified chronic bronchitis: Secondary | ICD-10-CM

## 2022-08-18 MED ORDER — GABAPENTIN 300 MG PO CAPS
ORAL_CAPSULE | ORAL | 3 refills | Status: DC
Start: 2022-08-18 — End: 2022-09-08

## 2022-08-18 NOTE — Patient Instructions (Addendum)
Order- DME [employer]-  new compressor nebulizer  dx chronic bronchitis                                  Duoneb neb solution 360 ml ref x 12  1 neb every 6 hours as needed  Script sent for gabapentin 300 mg twice daily    See if this reduces cough

## 2022-08-18 NOTE — Telephone Encounter (Signed)
Called and spoke with Juliann Pulse and have scheduled an appt for pt to be seen by CY today 10/30 (okay to use held slot per CY). Nothing further needed.

## 2022-08-18 NOTE — Telephone Encounter (Signed)
Ok to use held spot 

## 2022-08-18 NOTE — Telephone Encounter (Signed)
Dr. Annamaria Boots, please advise if we can use one of your held slots for pt either today or tomorrow to work pt in for an appt.

## 2022-08-18 NOTE — Assessment & Plan Note (Signed)
We will see if cough responds to uppera irway cough syndrome.

## 2022-08-18 NOTE — Assessment & Plan Note (Signed)
Little change in chronic parxosysmal cough. Discussed postnasal drip, upper airway cough. Plan- Add nebulizer Douneb, Try gabapentin for upper airway cough

## 2022-08-18 NOTE — Progress Notes (Signed)
History of Present Illness: . male former smoker with asthma/chronic bronchitis and allergic rhinitis, OSA PAF/ anticoagulation, aortic valve replacement, toxic encephalopathy, light sensitivity, chronic fatigue/fibromyalgia NPSG 03/24/03- AHI 88/ hr, desaturation to 75%, body weight 244 lbs PFT 06/01/17-WNL-FVC 4.46/96%, FEV1 3.71/106%, ratio 0.83, FEF 25-75% 4.64/167%, no response to dilator, TLC 107%, DLCO 101%  ------------------------------------------------------------------  07/07/22-  71 year old male former smoker (13.5 pkyrs) followed for Asthma/chronic Bronchitis and allergic Rhinitis,  OSA,  complicated by PAF/ anticoagulation, Aortic Valve Replacement,/ IE abscess, Toxic Encephalopathy, Light sensitivity, Chronic Fatigue/fibromyalgia -Flutter valve, Symbicort 160, Astelin nasal, tessalon, singulair, Ventolin hfa,  CPAP auto 5-15/[employer]   AirSense 10 AutoSet Download compliance- 97%, AHI 5.5/ hr Body weight today- Had repair abd hernia  PFT 07/07/22-  ------Pt f/u osa had Pft prior to appt. Everything seems to be going well, machine is working well 10hrs/night of sleep. Recent operation on 8/23 to remove old mesh. Plans RSV and Flu vax tomorrow at drug store. He had RSV vaccine yesterday and plans COVID and flu vaccines tomorrow.  Breathing is doing much better with much less cough.  He stopped Symbicort because he thought it was making him cough.  Now using rescue inhaler once or twice a week.  He says blowing effort for PFT today did not hurt his hernia repair incision. Comfortable with CPAP with no problems identified.  They brought in old machine to donate and it is given to our [facility] office. CXR 05/19/22- FINDINGS: Cardiac and mediastinal contours are unchanged post median sternotomy and aortic valve replacement. Unchanged fractures of some of the mediastinal wires. Lungs are clear. No pleural effusion or pneumothorax. IMPRESSION: No active cardiopulmonary disease.  08/18/22-   71 year old male former smoker (13.5 pkyrs) followed for Asthma/chronic Bronchitis and allergic Rhinitis,  OSA,  complicated by PAF/ Xarelto, Aortic Valve Replacement,/ IE abscess, Toxic Encephalopathy, Light sensitivity, Chronic Fatigue/fibromyalgia -Flutter valve, Symbicort 160, Astelin nasal, tessalon, singulair, Ventolin hfa,  CPAP   18/[employer]   AirSense 10 AutoSet    - replacement and change to auto5-15 ordered 5/16/233 Download compliance- 17%, AHI 8.3/ hr Body weight today-254 lbs Wife here- indicates his paroxysmal coughing distresses her. He tries to blow it off and indicates willingness to let it go. Tries clearly recognized triggers. Has been taking a lot of cold and allergy meds. Tessalon is some help. He is going to try accupuncture for cough and lack of energy. Admits some post-nasal drip and will try increasing zyrtec to twice daily. Codeine and tramadol make him "weird and crazy". Not using CPAP regularly recently- blames cough.Says hard cpough may skip days.  ROS-see HPI     + = positive Constitutional:   No-   weight loss, night sweats, fevers, chills, fatigue, lassitude. HEENT:   No-  headaches, difficulty swallowing, tooth/dental problems,  sore throat,       No-  sneezing, itching, ear ache,                    No-nasal congestion, + post nasal drip,  CV:  +chest pain, orthopnea, PND, swelling in lower extremities, anasarca,  dizziness, palpitations Resp: No- coughing up of blood.               change in color of mucus.  No- wheezing.   Skin: Clear GI:   heartburn, indigestion, abdominal pain, nausea, vomiting,  GU:  MS:  No-   joint pain or swelling.   Neuro-     nothing unusual Psych:  No- change in  mood or affect. + depression or anxiety.  No memory loss.  OBJ- Physical Exam Sunglasses, fidgeting/ restless General- Alert, Oriented, Affect-appropriate, Distress- none acute. +fidgeting, restless, sunglasses Skin- +eczematoid patches on lower calves, nonspecific,  excoriated Lymphadenopathy- none Head- atraumatic. Stares aimlessly around room like a blind person.             Eyes- Gross vision intact, PERRLA, conjunctivae and secretions clear,  + sunglasses            Ears- Hearing, canals-normal            Nose- Clear, no-Septal dev, mucus, polyps, erosion, perforation             Throat- Mallampati II-III, mucosa , drainage- none, tonsils- atrophic Neck- flexible , trachea midline, no stridor , thyroid nl, carotid no bruit Chest - symmetrical excursion , unlabored           Heart/CV- RRR ,  murmur+2-3/6 S, no gallop  , no rub, nl s1 s2,                            - JVD- none , edema- none, stasis changes- none, varices- none           Lung- + clear/ unlabored, wheeze- none, cough+"bronchitic", dullness-none, rub- none           Chest wall-  Abd-  Br/ Gen/ Rectal- Not done, not indicated Extrem- cyanosis- none, clubbing, none, atrophy- none, strength- nl Neuro- grossly intact to observation

## 2022-08-22 ENCOUNTER — Ambulatory Visit: Payer: Medicare Other | Admitting: Neurology

## 2022-08-22 DIAGNOSIS — E782 Mixed hyperlipidemia: Secondary | ICD-10-CM | POA: Diagnosis not present

## 2022-08-22 DIAGNOSIS — R059 Cough, unspecified: Secondary | ICD-10-CM | POA: Diagnosis not present

## 2022-08-22 DIAGNOSIS — J40 Bronchitis, not specified as acute or chronic: Secondary | ICD-10-CM | POA: Diagnosis not present

## 2022-08-22 DIAGNOSIS — E291 Testicular hypofunction: Secondary | ICD-10-CM | POA: Diagnosis not present

## 2022-08-22 DIAGNOSIS — E039 Hypothyroidism, unspecified: Secondary | ICD-10-CM | POA: Diagnosis not present

## 2022-08-22 DIAGNOSIS — J4520 Mild intermittent asthma, uncomplicated: Secondary | ICD-10-CM | POA: Diagnosis not present

## 2022-08-25 ENCOUNTER — Encounter: Payer: Self-pay | Admitting: Internal Medicine

## 2022-08-25 MED ORDER — IPRATROPIUM-ALBUTEROL 0.5-2.5 (3) MG/3ML IN SOLN: 3.0000 mL | Freq: Four times a day (QID) | RESPIRATORY_TRACT | 3 refills | Status: DC | PRN

## 2022-08-25 NOTE — Progress Notes (Signed)
Date:  09/08/2022   ID:  [name], DOB [birth year], MRN [id number]  PCP:  Shirline Frees, MD  Cardiologist: Dr. [name]  EP: Dr. [name]  Chief Complaint: PAF/AVR   History of Present Illness:   71 y.o. history of PAF, HTN, OSA on CPAP with AVR PVT  for bicuspid valve 2018 [place]  (35m Edwards pericardial Magna Ease)  No CAD at cath done 06/12/17 prior to surgery   Had recurrent PAF 07/28/17 converted with iv cardizem and seen by EP Dr TLovena Lewho recommended amiodarone 200 mg daily and changed back to  xarelto for anticoagulation   History of  angioedema. 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  A gated cardiac CTA was also suspicious but felt that circumferential thickening could be related to pledget material from the time of surgery.  D/c after prolonged hospital stay 11/05/21 Developed PAF/flutter around the time of PIC line change CXR showed PIC line at cavoatrial junction Rx xarelto and lopressor for rate control He was cardioverted in the EP lab by Dr  LQuentin Oreon 11/28/21   Called office 02/28/22 postural paleness and dizzy? Low pulse BP ok oxygen 70% range per wife Improved wearing his CPAP Rx with doxycycline from time/time for bronchitis   06/11/22 had hernia surgery with Dr GJennette Bankerheld with no issues  Seen in ED this weekend with blurred vision and in office today has had tardive dyskinesia which is quite new and prominent today. He has lip smacking and constant UE/LE movements Med list does not reveal and obvious culprit CT in ER only with old lacunar infarct no acute changes [facility] AD[facility]ice LeschWednesday   Past Medical History:  Diagnosis Date   Allergic rhinitis    Anxiety    Arthritis    Asthma    Bladder tumor    Chronic fatigue    COVID-19 05/22/2021   Depression    06/30/17 Pt denies being depressed, reports Effexor is taken for Chronic Fatigue    Dyspnea    Enlarged prostate    Fibromyalgia    GERD (gastroesophageal reflux disease)    Headache    History of chronic bronchitis    History of migraine    History of toxic encephalopathy    Hypothyroidism    OSA on CPAP    CPAP 14   PAF (paroxysmal atrial fibrillation) ([place] [facility] -- DR NJohnsie Cancel  DX OCT 2013   Pneumonia    S/P AVR (aortic valve replacement) and aortoplasty    Sensitiveness  to light    Unspecified essential hypertension    Urethral tumor    PROSTATIC    Past Surgical History:  Procedure Laterality Date   BENTALL PROCEDURE N/A 07/02/2017   Procedure: BENTALL PROCEDURE;  Surgeon: Ivin Poot, MD;  Location: [place];  Service: Open Heart Surgery;  Laterality: N/A;  WITH CIRC ARREST   BENTALL PROCEDURE  07/02/2017   w/ Aortic valve replacement    by Dr. Prescott Gum   BIOPSY  02/06/2021   Procedure: BIOPSY;  Surgeon: Clarene Essex, MD;  [facility];  Service: Endoscopy;;   BUBBLE STUDY  11/01/2021   Procedure: BUBBLE STUDY;  Surgeon: Geralynn Rile, MD;  Location: [place];  Service:  Cardiovascular;;   CARDIOVERSION N/A 11/28/2021   Procedure: CARDIOVERSION (CATH LAB);  Surgeon: Vickie Epley, MD;  Location: [place] [facility];  Service: Cardiovascular;  Laterality: N/A;   COLONOSCOPY     COLONOSCOPY WITH PROPOFOL N/A 02/06/2021   Procedure: COLONOSCOPY WITH PROPOFOL;  Surgeon: Clarene Essex, MD;  [facility];  Service: Endoscopy;  Laterality: N/A;   CORONARY ANGIOPLASTY  06/12/2017   CYSTOSCOPY W/ RETROGRADES Bilateral 06/15/2013   Procedure: CYSTOSCOPY WITH BILATERAL RETROGRADE PYELOGRAM  BLADDER BIOPSY, PROSTATIC URETHRAL BIOPSY, ;  Surgeon: Molli Hazard, MD;  [facility];  Service: Urology;  Laterality: Bilateral;   ESOPHAGOGASTRODUODENOSCOPY (EGD) WITH PROPOFOL N/A 03/11/2018   Procedure: ESOPHAGOGASTRODUODENOSCOPY (EGD) WITH PROPOFOL;  Surgeon: Clarene Essex, MD;  Location: [place];  Service: Endoscopy;  Laterality: N/A;   EXCISION OF MESH N/A 06/11/2022   Procedure: REMOVAL OF OLD MESH;  Surgeon: [name], MD;  Location: [facility];  Service: General;  Laterality: N/A;   LAPAROSCOPIC CHOLECYSTECTOMY  01/14/2001   PROCTOSCOPY N/A 06/12/2021   Procedure: RIGID PROCTOSCOPY;  Surgeon: [name], MD;  [facility];  Service: General;  Laterality: N/A;   RIGHT/LEFT HEART CATH AND CORONARY ANGIOGRAPHY N/A 06/12/2017   Procedure: RIGHT/LEFT HEART CATH AND CORONARY ANGIOGRAPHY;  Surgeon: Larey Dresser, MD;  Location: [place] [facility];  Service: Cardiovascular;  Laterality: N/A;   TEE WITHOUT CARDIOVERSION N/A 07/02/2017   Procedure: TRANSESOPHAGEAL ECHOCARDIOGRAM (TEE);  Surgeon: Prescott Gum, Collier Salina, MD;  Location: [place];  Service: Open Heart Surgery;  Laterality: N/A;   TEE WITHOUT CARDIOVERSION N/A 11/01/2021   Procedure: TRANSESOPHAGEAL ECHOCARDIOGRAM (TEE);  Surgeon: Geralynn Rile, MD;  Location: [place];  Service: Cardiovascular;  Laterality: N/A;   UMBILICAL HERNIA REPAIR  01/14/2008   VENTRAL  HERNIA REPAIR N/A 06/11/2022   Procedure: LAPAROSCOPIC VENTRAL HERNIA WITH LYSIS OF ADHESIONS;  Surgeon: [name], MD;  Location[facility];  Service: General;  Laterality: N/A;  WITH MESH   WISDOM TOOTH EXTRACTION      Current Medications: Prior to Admission medications   Medication Sig Start Date End Date Taking? Authorizing Provider  acetaminophen (TYLENOL) 500 MG tablet Take 500 mg by mouth every 6 (six) hours as needed for moderate pain.    [provider]  albuterol (VENTOLIN HFA) 108 (90 Base) MCG/ACT inhaler INHALE 2 PUFFS INTO LUNGS EVERY 6 HOURS AS NEEDED FOR WHEEZING OR SHORTNESS OF BREATH 01/19/17   Baird Lyons D, MD  amiodarone (PACERONE) 200 MG tablet Take 1 tablet (200 mg total) by mouth daily. 07/29/17   Leanor Kail, PA  aspirin 81 MG EC tablet Take 1 tablet (81 mg total) by mouth daily. 07/13/17   Gold, Wayne E, PA-C  azelastine (ASTELIN) 137 MCG/SPRAY nasal spray Place 1-2  sprays into the nose 2 (two) times daily as needed for rhinitis or allergies.     [provider]  benzonatate (TESSALON) 200 MG capsule Take 1 capsule (200 mg total) by mouth every 6 (six) hours as needed for cough. 11/19/16   Baird Lyons D, MD  budesonide-formoterol (SYMBICORT) 160-4.5 MCG/ACT inhaler INHALE 2 PUFF INTO THE LUNGS 2 TIMES A DAY. 01/19/17   Baird Lyons D, MD  Cholecalciferol (VITAMIN D-3) 5000 UNITS TABS Take 5,000 Units by mouth daily with breakfast.     [provider]  COLCRYS 0.6 MG tablet Take 0.6 mg by mouth daily. With breakfast 01/29/12   [provider]  Cyanocobalamin (VITAMIN B12 SL) Place 1,200 mcg under the tongue daily. 1 dropper    [provider]  dextromethorphan (DELSYM) 30 MG/5ML liquid Take 30 mg by mouth at bedtime as needed for cough.     [provider]  DHEA 50 MG TABS Take 50 mg by mouth daily with breakfast.     [provider]  fexofenadine (ALLEGRA) 180 MG tablet Take 180 mg by mouth daily  with breakfast.     [provider]  finasteride (PROSCAR) 5 MG tablet Take 5 mg by mouth at bedtime.  01/14/14   [provider]  Hypromellose Vertell Limber ALLERGY NASAL SPRAY NA) Place 1 spray into both nostrils daily as needed (for allergies).     [provider]  levothyroxine (SYNTHROID, LEVOTHROID) 112 MCG tablet Take 112 mcg by mouth daily before breakfast.     [provider]  liothyronine (CYTOMEL) 25 MCG tablet Take 25 mcg by mouth every morning.     [provider]  magic mouthwash w/lidocaine SOLN Take 10 mLs by mouth 4 (four) times daily as needed for mouth pain. 07/16/17   Deneise Lever, MD  Menthol, Topical Analgesic, (BLUE-EMU MAXIMUM STRENGTH EX) Apply 1 application topically 4 (four) times daily as needed (for arthritis pain.).    [provider]  metoprolol tartrate (LOPRESSOR) 50 MG tablet Take 1 tablet (50 mg total) by mouth 2 (two) times daily. 07/29/17   Bhagat, Bhavinkumar, PA  milk thistle 175 MG tablet Take 175 mg by mouth daily.    [provider]  Multiple Vitamins-Minerals (ONE-A-DAY MENS HEALTH FORMULA PO) Take 1 tablet by mouth daily with breakfast.     [provider]  omeprazole (PRILOSEC) 40 MG capsule Take 1 capsule (40 mg total) by mouth 2 (two) times daily. 10/31/16   Josue Hector, MD  perphenazine (TRILAFON) 2 MG tablet Take 2 mg by mouth 2 (two) times daily as needed (for migraines).     [provider]  Probiotic Product (ALIGN) 4 MG CAPS Take 4 mg by mouth daily with breakfast.     [provider]  tamsulosin (FLOMAX) 0.4 MG CAPS capsule Take 0.4 mg by mouth daily after supper.    [provider]  testosterone cypionate (DEPOTESTOTERONE CYPIONATE) 100 MG/ML injection Inject 200 mg into the muscle every Thursday. For IM use only     [provider]  tetrahydrozoline-zinc (VISINE-AC) 0.05-0.25 % ophthalmic solution Place 1 drop into both eyes as needed (dry  eyes).    [provider]  venlafaxine (EFFEXOR-XR) 150 MG 24 hr capsule Take 150 mg by mouth every morning.     [provider]  warfarin (COUMADIN) 2.5 MG tablet Take 1 tablet (2.5 mg total) by mouth daily at 6 PM. Take 7.'5mg'$  (3 tablet tonight) and '5mg'$  tomorrow and  INR check Friday Patient taking differently: Take 2.5 mg by mouth daily at 6 PM. Take 7.'5mg'$  (3 tablet tonight) and '5mg'$  tomorrow and INR check Friday and as directed 07/29/17   Leanor Kail, PA    Allergies:   Perphenazine, Amiodarone, Codeine, Prednisone, and Tramadol   Social History   Socioeconomic History   Marital status: Married    Spouse name: Not on file   Number of children: Not on file   Years of education: Not on file   Highest education level: Not on file  Occupational History   Occupation: disabled    Comment: owner of buisness  Tobacco Use   Smoking status: Former    Packs/day: 0.50    Years: 27.00    Total pack years: 13.50    Types: Cigarettes    Start date: 59    Quit date: 10/21/1979    Years since quitting: 42.9   Smokeless tobacco: Never  Vaping Use   Vaping Use: Never used  Substance and Sexual Activity   Alcohol use: Yes    Alcohol/week: 4.0 standard drinks of alcohol    Types: 4 Cans of beer per week    Comment: drinks "as much as I could" - 2-3 beers up to 8-10   Drug use: Never   Sexual activity: Not Currently  Other Topics Concern   Not on file  Social History Narrative   Right handed   One story home   Drinks no caffeine   Social Determinants of Health   Financial Resource Strain: Unknown (11/22/2021)   Overall Financial Resource Strain (CARDIA)    Difficulty of Paying Living Expenses: Patient refused  Food Insecurity: No Food Insecurity (11/22/2021)   Hunger Vital Sign    Worried About Running Out of Food in the Last Year: Never true    Ran Out of Food in the Last Year: Never true  Transportation Needs: No Transportation Needs (11/22/2021)   PRAPARE -  Hydrologist (Medical): No    Lack of Transportation (Non-Medical): No  Physical Activity: Unknown (11/22/2021)   Exercise Vital Sign    Days of Exercise per Week: 4 days    Minutes of Exercise per Session: Not on file  Stress: Unknown (11/22/2021)   [place]    Feeling of Stress : Patient refused  Social Connections: Unknown (11/22/2021)   Social Connection and Isolation Panel [NHANES]    Frequency of Communication with Friends and Family: More than three times a week    Frequency of Social Gatherings with Friends and Family: Once a week    Attends Religious Services: Patient refused    Marine scientist or Organizations: No    Attends Music therapist: Not on file    Marital Status: Married     Family History:  The patient's family history includes Aortic aneurysm (age of onset: 61) in his mother; Heart disease in an other family member; Other in his father.   ROS:   Please see the history of present illness.    ROS All other systems reviewed and are negative.   PHYSICAL EXAM:   VS:  BP 138/88   Pulse 70   Ht '5\' 10"'$  (1.778 m)   Wt 246 lb 9.6 oz (111.9 kg)   SpO2 97%   BMI 35.38 kg/m     Affect appropriate Obese white male  HEENT: normal Neck supple with no adenopathy JVP normal no bruits  no thyromegaly Lungs clear with no wheezing and good diaphragmatic motion Heart:  S1/S2 SEM no AR  murmur, no rub, gallop or click PMI normal post sternotomy  Abdomen: post umbilical hernia repair  Distal pulses intact with no bruits No edema Neuro non-focal Skin warm and dry No muscular weakness PIC line LUE with no edema or signs of phlebitis     Wt Readings from Last 3 Encounters:  09/08/22 246 lb 9.6 oz (111.9 kg)  08/18/22 254 lb 6.4 oz (115.4 kg)  07/07/22 242 lb (109.8 kg)      Studies/Labs Reviewed:   EKG:   02/16/19 SR LAD nonspecific ST changes  09/08/2022 NSR rate 77 sinus arrhythmia stable 09/08/2022 SR rate 62 QRS 129 msec minimally prolonged no significant AV block   Recent Labs: 10/30/2021: Magnesium 1.6 09/06/2022: ALT 28; BUN 10; Creatinine, Ser 0.94; Hemoglobin 16.8; Platelets 154; Potassium 3.9; Sodium 138   Lipid Panel    Component Value Date/Time   CHOL 185 02/17/2019 0714   CHOL 184 05/05/2017 0931   TRIG 234 (H) 02/17/2019 0714   HDL 42 02/17/2019 0714   HDL 45 05/05/2017 0931   CHOLHDL 4.4 02/17/2019 0714   VLDL 47 (H) 02/17/2019 0714   LDLCALC 96 02/17/2019 0714   LDLCALC 94 05/05/2017 0931    Additional studies/ records that were reviewed today include:  As above   ASSESSMENT & PLAN:    1. PAF - on xarelto continue beta blocker off amiodarone now [facility] by Dr Quentin Ore 11/28/21 Maintaining NSR   2. HTN - Well controlled.  Continue current medications and low sodium Dash type diet.     3.  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 Prescott Gum 07/02/17. Post op echo with stable gradients no AR  -TEE 11/01/21 EF 60-65% 25 mm Edwards magna ease no AR And normal gradients mean 17 peak 29 mmhg Cardiac CTA with no abscess or SBE   4. OSA -Compliant with CPAP.  5. 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 F/U allergist cannot take oral steroids due to MS changes   6. Global Amnesia:  F/u neurology MRI/EEG ok ? Movement disorder improving etiology not clear   7. Bacteremia:  post 6 weeks antibiotics via PIC line no evidence of SBE on TEE/cardiac CTA f/u with ID at end of month to d/c PIC line   8. Pulmonary :  seeing [name] has some bronchitis and ? Seasonal allergies leading to cough and congestion in throat Episode of hypoxia/bradycardia likely from mucous impaction as improved with CPAP and doxycycline   9. Tardive Dyskinesia:  acute onset CT head not acute no obvious meds This is quite a change from his usually motor skills and wife also  indicates more memory and cognitive changes See neurology Wednesday    F/U in 6 months      [name]

## 2022-09-04 ENCOUNTER — Telehealth: Payer: Self-pay | Admitting: Anesthesiology

## 2022-09-04 ENCOUNTER — Encounter: Payer: Self-pay | Admitting: Neurology

## 2022-09-04 NOTE — Telephone Encounter (Signed)
After the surgery In Aug he has been going down hill he has had some respiratory problems. Recently he feels like he has had a stroke but he isn't sure when he may of had it. Pt wife stated that he has had weakness, he drops his fork, he has dropped a glass, yesterday when walking with a friend he got weak and almost last his balance. She stated its hard to tell if he has had slurred speech because when he gets tired or a headache he gets slurred speech. She said she was worried when when he looked at her and he needed to be seen. Pt missed his last Appointment with Dr Delice Lesch due to possible Covid and its been over 6 months since he has been in our office, Pt wife advised to call his PCP to see if they could order an MRI because he has seen them in the past 6 months. Pt wife also advised to take the Pt to the ER and she stated that he didn't want to go to the ER. Pt stated she will call the PCP when we got off the phone,

## 2022-09-04 NOTE — Telephone Encounter (Signed)
Patient's wife called stating that pt has been having profound weakness, double vision, nausea and he has been dropping things unable to hold on to them. She requests a call back.

## 2022-09-05 ENCOUNTER — Encounter: Payer: Self-pay | Admitting: Neurology

## 2022-09-05 ENCOUNTER — Encounter: Payer: Self-pay | Admitting: Cardiovascular Disease

## 2022-09-05 NOTE — Telephone Encounter (Signed)
Needs to be seen in office, I will see him on Wednesday morning, but if symptoms worsen before then, he has to go to ER. Thanks

## 2022-09-05 NOTE — Telephone Encounter (Signed)
Advised ER if symptoms changed.

## 2022-09-06 ENCOUNTER — Emergency Department (HOSPITAL_COMMUNITY): Payer: Medicare Other

## 2022-09-06 ENCOUNTER — Emergency Department (HOSPITAL_COMMUNITY)
Admission: EM | Admit: 2022-09-06 | Discharge: 2022-09-06 | Disposition: A | Discharge status: Home / Self Care | Payer: Medicare Other | Attending: Emergency Medicine | Admitting: Emergency Medicine

## 2022-09-06 DIAGNOSIS — R42 Dizziness and giddiness: Secondary | ICD-10-CM

## 2022-09-06 DIAGNOSIS — G43909 Migraine, unspecified, not intractable, without status migrainosus: Secondary | ICD-10-CM | POA: Diagnosis not present

## 2022-09-06 DIAGNOSIS — I959 Hypotension, unspecified: Secondary | ICD-10-CM | POA: Diagnosis not present

## 2022-09-06 DIAGNOSIS — G319 Degenerative disease of nervous system, unspecified: Secondary | ICD-10-CM | POA: Diagnosis not present

## 2022-09-06 DIAGNOSIS — Z7901 Long term (current) use of anticoagulants: Secondary | ICD-10-CM | POA: Diagnosis not present

## 2022-09-06 DIAGNOSIS — R251 Tremor, unspecified: Secondary | ICD-10-CM | POA: Diagnosis not present

## 2022-09-06 DIAGNOSIS — Z79899 Other long term (current) drug therapy: Secondary | ICD-10-CM | POA: Diagnosis not present

## 2022-09-06 DIAGNOSIS — R110 Nausea: Secondary | ICD-10-CM | POA: Diagnosis not present

## 2022-09-06 DIAGNOSIS — R519 Headache, unspecified: Secondary | ICD-10-CM | POA: Diagnosis not present

## 2022-09-06 LAB — DIFFERENTIAL
Abs Immature Granulocytes: 0.09 10*3/uL — ABNORMAL HIGH (ref 0.00–0.07)
Basophils Absolute: 0.1 10*3/uL (ref 0.0–0.1)
Basophils Relative: 1 %
Eosinophils Absolute: 0.3 10*3/uL (ref 0.0–0.5)
Eosinophils Relative: 3 %
Immature Granulocytes: 1 %
Lymphocytes Relative: 10 %
Lymphs Abs: 1 10*3/uL (ref 0.7–4.0)
Monocytes Absolute: 1.2 10*3/uL — ABNORMAL HIGH (ref 0.1–1.0)
Monocytes Relative: 11 %
Neutro Abs: 7.5 10*3/uL (ref 1.7–7.7)
Neutrophils Relative %: 74 %

## 2022-09-06 LAB — CBC
HCT: 50.2 % (ref 39.0–52.0)
Hemoglobin: 16.8 g/dL (ref 13.0–17.0)
MCH: 32.6 pg (ref 26.0–34.0)
MCHC: 33.5 g/dL (ref 30.0–36.0)
MCV: 97.5 fL (ref 80.0–100.0)
Platelets: 154 10*3/uL (ref 150–400)
RBC: 5.15 MIL/uL (ref 4.22–5.81)
RDW: 12.4 % (ref 11.5–15.5)
WBC: 10.2 10*3/uL (ref 4.0–10.5)
nRBC: 0 % (ref 0.0–0.2)

## 2022-09-06 LAB — COMPREHENSIVE METABOLIC PANEL
ALT: 28 U/L (ref 0–44)
AST: 30 U/L (ref 15–41)
Albumin: 3.7 g/dL (ref 3.5–5.0)
Alkaline Phosphatase: 41 U/L (ref 38–126)
Anion gap: 5 (ref 5–15)
BUN: 10 mg/dL (ref 8–23)
CO2: 27 mmol/L (ref 22–32)
Calcium: 8.9 mg/dL (ref 8.9–10.3)
Chloride: 106 mmol/L (ref 98–111)
Creatinine, Ser: 0.94 mg/dL (ref 0.61–1.24)
GFR, Estimated: >60 mL/min (ref >60)
Glucose, Bld: 96 mg/dL (ref 70–99)
Potassium: 3.9 mmol/L (ref 3.5–5.1)
Sodium: 138 mmol/L (ref 135–145)
Total Bilirubin: 0.7 mg/dL (ref 0.3–1.2)
Total Protein: 6.1 g/dL — ABNORMAL LOW (ref 6.5–8.1)

## 2022-09-06 LAB — URINALYSIS, ROUTINE W REFLEX MICROSCOPIC
Bilirubin Urine: NEGATIVE
Glucose, UA: NEGATIVE mg/dL
Hgb urine dipstick: NEGATIVE
Ketones, ur: NEGATIVE mg/dL
Leukocytes,Ua: NEGATIVE
Nitrite: NEGATIVE
Protein, ur: NEGATIVE mg/dL
Specific Gravity, Urine: 1.006 (ref 1.005–1.030)
pH: 8 (ref 5.0–8.0)

## 2022-09-06 LAB — APTT: aPTT: 41 seconds — ABNORMAL HIGH (ref 24–36)

## 2022-09-06 LAB — ETHANOL: Alcohol, Ethyl (B): <10 mg/dL (ref <10)

## 2022-09-06 LAB — RAPID URINE DRUG SCREEN, HOSP PERFORMED
Amphetamines: NOT DETECTED
Barbiturates: NOT DETECTED
Benzodiazepines: NOT DETECTED
Cocaine: NOT DETECTED
Opiates: NOT DETECTED
Tetrahydrocannabinol: NOT DETECTED

## 2022-09-06 LAB — PROTIME-INR
INR: 1.3 — ABNORMAL HIGH (ref 0.8–1.2)
Prothrombin Time: 15.8 seconds — ABNORMAL HIGH (ref 11.4–15.2)

## 2022-09-06 MED ORDER — METOCLOPRAMIDE HCL 5 MG/ML IJ SOLN
10.0000 mg | Freq: Once | INTRAMUSCULAR | Status: AC
  Administered 2022-09-06: 10 mg via INTRAVENOUS
  Filled 2022-09-06: qty 2

## 2022-09-06 MED ORDER — SODIUM CHLORIDE 0.9 % IV BOLUS
500.0000 mL | Freq: Once | INTRAVENOUS | Status: AC
  Administered 2022-09-06: 500 mL via INTRAVENOUS

## 2022-09-06 MED ORDER — SODIUM CHLORIDE 0.9 % IV BOLUS
1000.0000 mL | Freq: Once | INTRAVENOUS | Status: AC
  Administered 2022-09-06: 1000 mL via INTRAVENOUS

## 2022-09-06 MED ORDER — LORAZEPAM 2 MG/ML IJ SOLN
1.0000 mg | INTRAMUSCULAR | Status: DC | PRN
  Administered 2022-09-06 (×2): 1 mg via INTRAVENOUS
  Filled 2022-09-06 (×2): qty 1

## 2022-09-06 MED ORDER — DEXAMETHASONE SODIUM PHOSPHATE 10 MG/ML IJ SOLN
10.0000 mg | Freq: Once | INTRAMUSCULAR | Status: AC
  Administered 2022-09-06: 10 mg via INTRAVENOUS
  Filled 2022-09-06: qty 1

## 2022-09-06 MED ORDER — SODIUM CHLORIDE 0.9 % IV SOLN
100.0000 mL/h | INTRAVENOUS | Status: DC
  Administered 2022-09-06: 100 mL/h via INTRAVENOUS

## 2022-09-06 NOTE — ED Provider Notes (Signed)
[name] DEPT Provider Note   CSN: [id number] Arrival date & time: 09/06/22  1310     History  Chief Complaint  Patient presents with   Dizziness    [name] is a 71 y.o. male.  HPI Patient presents with his wife who is the power of attorney.  Patient has multiple medical issues including cognitive impairment, baseline tremor, chronic fatigue syndrome.  He presents with concern of about 1 week and change in condition including worsening gait instability, new ataxia.  No change in medication, diet, activity recently.  After speaking with his cardiologist and neurology clinic yesterday patient was encouraged to come here for evaluation.  Patient denies focal pain beyond headache, and has a history of migraines.  He does note worsening photophobia, otherwise head exam similar to prior.    Home Medications Prior to Admission medications   Medication Sig Start Date End Date Taking? Authorizing Provider  acetaminophen (TYLENOL) 500 MG tablet Take 500 mg by mouth 2 (two) times daily as needed for headache (or pain).   Yes [provider]  albuterol (VENTOLIN HFA) 108 (90 Base) MCG/ACT inhaler INHALE 2 PUFFS INTO THE LUNGS EVERY 6 HOURS AS NEEDED FOR WHEEZING OR SHORTNESS OF BREATH Patient taking differently: Inhale 2 puffs into the lungs every 6 (six) hours as needed for wheezing or shortness of breath. 07/07/22  Yes Young, Tarri Fuller D, MD  ALPRAZolam Duanne Moron) 0.25 MG tablet Take 1 tablet (0.25 mg total) by mouth 3 (three) times daily as needed for anxiety. 10/13/20  Yes Aline August, MD  aluminum hydroxide-magnesium carbonate (GAVISCON) 95-358 MG/15ML SUSP Take 15 mLs by mouth 3 (three) times daily as needed for indigestion or heartburn.   Yes [provider]  azelastine (ASTELIN) 137 MCG/SPRAY nasal spray Place 1-2 sprays into the nose 2 (two) times daily as needed for rhinitis or allergies.    Yes [provider]   benzonatate (TESSALON) 200 MG capsule Take 1 capsule (200 mg total) by mouth 3 (three) times daily as needed for cough. 08/20/21  Yes Parrett, [name] S, NP  budesonide-formoterol (SYMBICORT) 160-4.5 MCG/ACT inhaler INHALE 2 PUFFS INTO THE LUNGS IN THE MORNING AND AT BEDTIME Patient taking differently: Inhale 2 puffs into the lungs in the morning and at bedtime. 06/02/22  Yes Young, Tarri Fuller D, MD  cetirizine (ZYRTEC) 10 MG tablet Take 10 mg by mouth every morning.   Yes [provider]  colchicine (COLCRYS) 0.6 MG tablet Take 0.6 mg by mouth every morning.   Yes [provider]  dextromethorphan (DELSYM) 30 MG/5ML liquid Take 30 mg by mouth daily as needed for cough.   Yes [provider]  diclofenac Sodium (VOLTAREN) 1 % GEL Apply 2 g topically at bedtime as needed (to affected areas- for arthritis pain.).   Yes [provider]  diphenhydrAMINE (BENADRYL) 25 mg capsule Take 25 mg by mouth daily as needed for allergies.   Yes [provider]  Erenumab-aooe (AIMOVIG) 140 MG/ML SOAJ Inject 140 mg into the skin every 28 (twenty-eight) days. Patient taking differently: Inject 140 mg into the skin every 28 (twenty-eight) days. 01/15/22  Yes Cameron Sprang, MD  Ferrous Sulfate (IRON) 142 (45 Fe) MG TBCR Take 142 mg by mouth in the morning and at bedtime.   Yes [provider]  Fiber 500 MG CAPS Take 1,000 mg by mouth in the morning.   Yes [provider]  finasteride (PROSCAR) 5 MG tablet Take 5 mg by mouth  at bedtime.  01/14/14  Yes [provider]  Guaifenesin (MUCINEX MAXIMUM STRENGTH) 1200 MG TB12 Take 1,200 mg by mouth 2 (two) times daily as needed (cough).   Yes [provider]  ibuprofen (ADVIL) 200 MG tablet Take 400 mg by mouth 2 (two) times daily as needed for headache (or pain).   Yes [provider]  ipratropium (ATROVENT) 0.03 % nasal spray Place 2 sprays into both nostrils 2 (two) times daily as needed for  rhinitis.   Yes [provider]  ipratropium-albuterol (DUONEB) 0.5-2.5 (3) MG/3ML SOLN Take 3 mLs by nebulization every 6 (six) hours as needed. Patient taking differently: Take 3 mLs by nebulization every 6 (six) hours as needed (for shortness of breath or wheezing). 08/25/22  Yes Young, Tarri Fuller D, MD  levothyroxine (SYNTHROID, LEVOTHROID) 112 MCG tablet Take 112 mcg by mouth daily before breakfast.    Yes [provider]  liothyronine (CYTOMEL) 25 MCG tablet Take 25 mcg by mouth daily before breakfast.   Yes [provider]  losartan (COZAAR) 50 MG tablet TAKE 1 TABLET BY MOUTH EVERY DAY Patient taking differently: Take 50 mg by mouth every morning. 10/01/20  Yes [name], MD  Menthol, Topical Analgesic, (BLUE-EMU MAXIMUM STRENGTH EX) Apply 1 application topically 3 (three) times daily as needed (arthritis pain.).   Yes [provider]  methocarbamol (ROBAXIN) 750 MG tablet Take 1 tablet (750 mg total) by mouth 4 (four) times daily as needed (use for muscle cramps/pain). 06/11/22  Yes [name], MD  metoprolol tartrate (LOPRESSOR) 50 MG tablet TAKE 1 TABLET(50 MG) BY MOUTH TWICE DAILY Patient taking differently: Take 50 mg by mouth in the morning and at bedtime. 05/29/22  Yes Josue Hector, MD  Multiple Vitamin (MULTIVITAMIN WITH MINERALS) TABS tablet Take 1 tablet by mouth daily with breakfast.   Yes [provider]  naproxen sodium (ALEVE) 220 MG tablet Take 220 mg by mouth 2 (two) times daily as needed (pain/headache).   Yes [provider]  olopatadine (PATANOL) 0.1 % ophthalmic solution Place 1 drop into both eyes 2 (two) times daily as needed for allergies.   Yes [provider]  omeprazole (PRILOSEC) 40 MG capsule Take 1 capsule (40 mg total) by mouth 2 (two) times daily. 10/31/16  Yes Josue Hector, MD  PRESCRIPTION MEDICATION See admin instructions. CPAP- At bedtime   Yes [provider]  rizatriptan  (MAXALT) 10 MG tablet Take 1 tablet (10 mg total) by mouth as needed for migraine. May repeat in 2 hours if needed Patient taking differently: Take 10 mg by mouth See admin instructions. Take 10 mg (1 mg) by mouth at onset of migraine headache, may repeat in 2 hours if no relief 01/15/22  Yes Cameron Sprang, MD  saccharomyces boulardii (FLORASTOR) 250 MG capsule Take 250 mg by mouth every morning.   Yes [provider]  sodium chloride (OCEAN) 0.65 % SOLN nasal spray Place 1 spray into both nostrils as needed for congestion.   Yes [provider]  tamsulosin (FLOMAX) 0.4 MG CAPS capsule Take 0.4 mg by mouth in the morning and at bedtime.   Yes [provider]  Testosterone Cypionate 200 MG/ML SOLN Inject 200 mg into the muscle See admin instructions. Inject 200 mg (1 ml) intramuscularly once every 7-10 days   Yes [provider]  venlafaxine (EFFEXOR-XR) 150 MG 24 hr capsule Take 150 mg by mouth at bedtime.   Yes [provider]  Alveda Reasons 20  MG TABS tablet TAKE 1 TABLET(20 MG) BY MOUTH DAILY WITH SUPPER Patient taking differently: Take 20 mg by mouth at bedtime. 05/12/22  Yes Josue Hector, MD  zolpidem (AMBIEN) 10 MG tablet Take 10 mg by mouth at bedtime as needed for sleep. 11/06/21  Yes [provider]  gabapentin (NEURONTIN) 300 MG capsule 1 twice daily Patient not taking: Reported on 09/06/2022 08/18/22   Baird Lyons D, MD      Allergies    Perphenazine, Amiodarone, Codeine, Prednisone, and Tramadol    Review of Systems   Review of Systems  All other systems reviewed and are negative.   Physical Exam Updated Vital Signs BP (!) 139/101   Pulse 79   Temp 98.2 F (36.8 C) (Oral)   Resp 17   SpO2 94%  Physical Exam Vitals and nursing note reviewed.  Constitutional:      Appearance: He is well-developed. He is obese.     Comments: Uncomfortable appearing obese elderly male wearing sunglasses  HENT:     Head: Normocephalic and  atraumatic.  Eyes:     Conjunctiva/sclera: Conjunctivae normal.  Cardiovascular:     Rate and Rhythm: Normal rate and regular rhythm.  Pulmonary:     Effort: Pulmonary effort is normal. No respiratory distress.     Breath sounds: No stridor.  Abdominal:     General: There is no distension.  Skin:    General: Skin is warm and dry.  Neurological:     Mental Status: [name], place, and time.     Motor: Tremor and atrophy present.     Comments: No asymmetry in strength, no obvious cerebellar abnormality, face is symmetric, speech is clear.     ED Results / Procedures / Treatments   Labs (all labs ordered are listed, but only abnormal results are displayed) Labs Reviewed  PROTIME-INR - Abnormal; Notable for the following components:      Result Value   Prothrombin Time 15.8 (*)    INR 1.3 (*)    All other components within normal limits  APTT - Abnormal; Notable for the following components:   aPTT 41 (*)    All other components within normal limits  DIFFERENTIAL - Abnormal; Notable for the following components:   Monocytes Absolute 1.2 (*)    Abs Immature Granulocytes 0.09 (*)    All other components within normal limits  COMPREHENSIVE METABOLIC PANEL - Abnormal; Notable for the following components:   Total Protein 6.1 (*)    All other components within normal limits  CBC  ETHANOL  RAPID URINE DRUG SCREEN, HOSP PERFORMED  URINALYSIS, ROUTINE W REFLEX MICROSCOPIC    EKG EKG Interpretation  Date/Time:  Saturday September 06 2022 14:16:18 EST Ventricular Rate:  62 PR Interval:  194 QRS Duration: 129 QT Interval:  431 QTC Calculation: 438 R Axis:   -37 Text Interpretation: Sinus rhythm Left bundle branch block Abnormal ECG Confirmed by Carmin Muskrat [phone]) on 09/06/2022 4:26:55 PM  Radiology MR BRAIN WO CONTRAST  Result Date: 09/06/2022 CLINICAL DATA:  Neuro deficit, acute, stroke suspected. Dizziness. Migraine headaches. Fatigue and  tremors. Symptoms for 2 months. EXAM: MRI HEAD WITHOUT CONTRAST TECHNIQUE: Multiplanar, multiecho pulse sequences of the brain and surrounding structures were obtained without intravenous contrast. COMPARISON:  MR head without contrast 05/19/2022 FINDINGS: Brain: No acute infarct, hemorrhage, or mass lesion is present. Remote lacunar infarct is again noted in the left caudate head. Remote ischemic changes are present in the  thalami bilaterally. Basal ganglia are otherwise normal bilaterally. Mild atrophy and white matter changes are within normal limits for age. The ventricles are of normal size. No significant extraaxial fluid collection is present. The internal auditory canals are within normal limits. The brainstem and cerebellum are within normal limits. Vascular: Flow is present in the major intracranial arteries. Skull and upper cervical spine: The craniocervical junction is normal. Upper cervical spine is within normal limits. Marrow signal is unremarkable. Sinuses/Orbits: Small amount of fluid is present in the mastoid air cells bilaterally. No obstructing nasopharyngeal lesion is present. A polyp or mucous retention cyst is again noted in the right sphenoid sinus. The paranasal sinuses are otherwise clear. IMPRESSION: 1. No acute intracranial abnormality or significant interval change. 2. 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 3. Remote ischemic changes in the thalami bilaterally. 4. Small amount of fluid in the mastoid air cells bilaterally. No obstructing nasopharyngeal lesion is present. Electronically Signed   By: San Morelle M.D.   On: 09/06/2022 15:26    Procedures Procedures    Medications Ordered in ED Medications  sodium chloride 0.9 % bolus 500 mL (0 mLs Intravenous Stopped 09/06/22 1700)    Followed by  0.9 %  sodium chloride infusion (0 mL/hr Intravenous Stopped 09/06/22 1806)  LORazepam (ATIVAN) injection 1 mg (1 mg Intravenous Given 09/06/22 1957)  sodium  chloride 0.9 % bolus 1,000 mL (0 mLs Intravenous Stopped 09/06/22 2007)  metoCLOPramide (REGLAN) injection 10 mg (10 mg Intravenous Given 09/06/22 1806)  dexamethasone (DECADRON) injection 10 mg (10 mg Intravenous Given 09/06/22 1801)    ED Course/ Medical Decision Making/ A&P This patient with a Hx of cognitive impairment, gait difficulty, chronic fatigue presents to the ED for concern of gait difficulty, new ataxia, fatigue, headache, this involves an extensive number of treatment options, and is a complaint that carries with it a high risk of complications and morbidity.    The differential diagnosis includes stroke, complicated migraine, progression of disease, medication reaction   Social Determinants of Health:  Cognitive impairment, advanced age, chronic fatigue  Additional history obtained:  Additional history and/or information obtained from wife at bedside, notable for details included in HPI   After the initial evaluation, orders, including: MR labs monitoring were initiated.   Patient placed on Cardiac and Pulse-Oximetry Monitors. The patient was maintained on a cardiac monitor.  The cardiac monitored showed an rhythm of 65 sinus normal The patient was also maintained on pulse oximetry. The readings were typically 100% room air normal   On repeat evaluation of the patient improved 8:12 PM Headache improved patient interacting appropriately.  I reviewed MRI results with the patient and his wife at bedside.   Lab Tests:  I personally interpreted labs.  The pertinent results include: Unremarkable labs  Imaging Studies ordered:  I independently visualized and interpreted imaging which showed MRI reviewed, CT reviewed, no evidence for new stroke, though comparison to July MRI results, there may have been new infarct. I agree with the radiologist interpretation   Dispostion / Final MDM:  After consideration of the diagnostic results and the patient's response to  treatment, this adult male with multiple medical issues including A-fib, migraines, cognitive delay, chronic pain, tremor presents with ataxia, headache.  Patient is mentating appropriately, has not reassuring physical exam including neuro exam that is generally unremarkable aside from evidence of his tremor.  Patient had MRI without evidence for acute infarct.  He has follow-up scheduled in 48 hours and again  2 days after that with his cardiologist and neurologist.  Given his complex medical history, ongoing use of anticoagulation, though with reassuring results today he and his wife will discuss findings with those individuals in the coming days.  Hospitalization was a consideration given consideration of his reported ataxia, but with reassuring findings, patient discharged in stable condition.  Final Clinical Impression(s) / ED Diagnoses Final diagnoses:  Dizziness    Rx / DC Orders ED Discharge Orders     None         Carmin Muskrat, MD 09/06/22 2015

## 2022-09-06 NOTE — Discharge Instructions (Addendum)
As discussed, today's evaluation has been generally reassuring.  However, if you develop new, or concerning changes in your condition be sure to return here for additional evaluation.  Otherwise follow-up with your physicians on Monday and Wednesday.  Your MRI results are included below.  Please take these with you for your follow-up visits.  FINDINGS: Brain: No acute infarct, hemorrhage, or mass lesion is present. Remote lacunar infarct is again noted in the left caudate head. Remote ischemic changes are present in the thalami bilaterally. Basal ganglia are otherwise normal bilaterally. Mild atrophy and white matter changes are within normal limits for age.   The ventricles are of normal size. No significant extraaxial fluid collection is present.   The internal auditory canals are within normal limits. The brainstem and cerebellum are within normal limits.   Vascular: Flow is present in the major intracranial arteries.   Skull and upper cervical spine: The craniocervical junction is normal. Upper cervical spine is within normal limits. Marrow signal is unremarkable.   Sinuses/Orbits: Small amount of fluid is present in the mastoid air cells bilaterally. No obstructing nasopharyngeal lesion is present. A polyp or mucous retention cyst is again noted in the right sphenoid sinus. The paranasal sinuses are otherwise clear.   IMPRESSION: 1. No acute intracranial abnormality or significant interval change. 2. 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 3. Remote ischemic changes in the thalami bilaterally. 4. Small amount of fluid in the mastoid air cells bilaterally. No obstructing nasopharyngeal lesion is present.

## 2022-09-06 NOTE — ED Triage Notes (Signed)
Pt BIB [facility] from home for dizziness, migraine, fatigue, tremors x 2 months. On Xarelto 160/90 BP 60 HR 101 cbg

## 2022-09-08 ENCOUNTER — Other Ambulatory Visit: Payer: Self-pay | Admitting: Neurology

## 2022-09-08 ENCOUNTER — Ambulatory Visit: Payer: Medicare Other | Attending: Cardiovascular Disease | Admitting: Cardiovascular Disease

## 2022-09-08 ENCOUNTER — Encounter: Payer: Self-pay | Admitting: Cardiovascular Disease

## 2022-09-08 VITALS — BP 138/88 | HR 70 | Ht 70.0 in | Wt 246.6 lb

## 2022-09-08 DIAGNOSIS — G2401 Drug induced subacute dyskinesia: Secondary | ICD-10-CM

## 2022-09-08 DIAGNOSIS — Z7901 Long term (current) use of anticoagulants: Secondary | ICD-10-CM | POA: Diagnosis not present

## 2022-09-08 DIAGNOSIS — I480 Paroxysmal atrial fibrillation: Secondary | ICD-10-CM

## 2022-09-08 DIAGNOSIS — I10 Essential (primary) hypertension: Secondary | ICD-10-CM | POA: Diagnosis not present

## 2022-09-08 DIAGNOSIS — Z952 Presence of prosthetic heart valve: Secondary | ICD-10-CM | POA: Diagnosis not present

## 2022-09-08 MED ORDER — METOPROLOL TARTRATE 50 MG PO TABS: ORAL_TABLET | ORAL | 3 refills | Status: DC

## 2022-09-08 MED ORDER — RIVAROXABAN 20 MG PO TABS: ORAL_TABLET | ORAL | 3 refills | Status: DC

## 2022-09-08 NOTE — Patient Instructions (Addendum)
Medication Instructions:  Your physician recommends that you continue on your current medications as directed. Please refer to the Current Medication list given to you today.  *If you need a refill on your cardiac medications before your next appointment, please call your pharmacy*  Lab Work: If you have labs (blood work) drawn today and your tests are completely normal, you will receive your results only by: Ferdinand (if you have MyChart) OR A paper copy in the mail If you have any lab test that is abnormal or we need to change your treatment, we will call you to review the results.  Testing/Procedures: None ordered today.  Follow-Up: At [facility], you and your health needs are our priority.  As part of our continuing mission to provide you with exceptional heart care, we have created designated Provider Care Teams.  These Care Teams include your primary Cardiologist (physician) and Advanced Practice Providers (APPs -  Physician Assistants and Nurse Practitioners) who all work together to provide you with the care you need, when you need it.  We recommend signing up for the patient portal called "MyChart".  Sign up information is provided on this After Visit Summary.  MyChart is used to connect with patients for Virtual Visits (Telemedicine).  Patients are able to view lab/test results, encounter notes, upcoming appointments, etc.  Non-urgent messages can be sent to your provider as well.   To learn more about what you can do with MyChart, go to [email or web address].    Your next appointment:   6 month(s)  The format for your next appointment:   In Person  Provider:   [name], MD       Important Information About Sugar

## 2022-09-10 ENCOUNTER — Ambulatory Visit: Payer: Medicare Other | Admitting: Neurology

## 2022-09-10 ENCOUNTER — Encounter: Payer: Self-pay | Admitting: Neurology

## 2022-09-10 VITALS — BP 156/103 | HR 91 | Ht 70.0 in | Wt 245.0 lb

## 2022-09-10 DIAGNOSIS — G249 Dystonia, unspecified: Secondary | ICD-10-CM

## 2022-09-10 DIAGNOSIS — G43109 Migraine with aura, not intractable, without status migrainosus: Secondary | ICD-10-CM | POA: Diagnosis not present

## 2022-09-10 MED ORDER — CLONAZEPAM 0.5 MG PO TABS: 0.5000 mg | ORAL_TABLET | Freq: Two times a day (BID) | ORAL | 0 refills | Status: DC | PRN

## 2022-09-10 MED ORDER — VENLAFAXINE HCL ER 75 MG PO CP24: ORAL_CAPSULE | ORAL | 0 refills | Status: DC

## 2022-09-10 NOTE — Patient Instructions (Addendum)
Let's start the next steps to hopefully get you feeling better:  Start weaning off the Venlafaxine: reduce to Venlafaxine XR '75mg'$  every night for 1 week, then 1 tablet every other night for 1 week, then stop  2. Start clonazepam 0.'5mg'$  every night. Update me in 1 week, we will increase to 1 tablet twice a day  3. Stop the Ambien. Hold off on the Xanax once starting the clonazepam.  4. Appointment will be set with Movement Disorders specialist Dr. Carles Collet for evaluation  5. Follow-up in 3 months, call for any changes

## 2022-09-10 NOTE — Progress Notes (Signed)
NEUROLOGY FOLLOW UP OFFICE NOTE  Grant [name] [id number] November 30, 1950  HISTORY OF PRESENT ILLNESS: I had the pleasure of seeing Grant [name] in follow-up in the neurology clinic on 09/10/2022.  The patient was last seen 8 months ago for migraines and new onset involuntary movements that started around 11/2020 where he would have jerking/twitching movements, more on the right. In the past, he would wave his right arm or constantly move his right leg. He had difficulty using his cellphone or the computer mouse because he could not control the hand movements. He had a normal brain MRI without contrast in March 2022, January 2023, and July 2023. Chorea bloodwork was negative. These had initially quieted down but he continued to have repeated mouth movements (chewing movements) and leg movements. She would alert him about them and they would stop briefly.  He also has migraines and takes Aimovig for prophylaxis and prn Maxalt.  His wife contacted our office last week to report symptoms of concern. He has been having increasing exhaustion and fatigue. On 11/16 he was in distress due to dizziness, double vision, feeling like he was going to fall down. Double vision tilts to the left. His hands and arms had intermittent pain, mostly on the right side. Dexterity in right hand is worsening, grasping is clumsy, dropping things. He was able to follow all of Grant [name]'s instructions, no focal weakness noted. [name] oximeter showed O2 of 90-94. His migraines are worse, with more right eye squinting. Speech is a bit slurred as if he is more tried to form word. Concentration is less. He is also having more verbal tics (whoo whoo) and physical tics like hand and feet waving or chewing his lips vigorously. She provided additional information that from [facility] he had upper respiratory symptoms with 3 courses of antibiotics, horrible coughing fits all day for weeks. Depo-Medrol and Augmentin largely resolved the issues but  they wondered if all that extended and horrible coughing may have caused these issues. Gabapentin was prescribed for cough but not started due to his medication burden. Grant [name] is concerned he may have had a stroke of some kind, or if this was heart-related. He is worried his life is draining away and he is not feeling good despite all the medications he is taking. He went to the ER on 09/06/2022 where he had another brain MRI without contrast with no acute changes seen. He is understandably frustrated with his symptoms and limitations. His aggravation has gone "way up." He reports that in the past, when he exercised, the movements quieted down for a few days, but with all his health issues recently, he has been unable to exercise. He states "I'd rather just die that continue to do it." When probed, he does not have any suicidal plans, Grant [name] states he is not going to hurt himself, "he is dramatic." We reviewed his medications. He has been on Venlafaxine XR '150mg'$  daily for many years. He takes Ambien '10mg'$  qhs, he can't tell it helps, but his wife says he does go to sleep and feels it does help him. She states he is not the best at taking pills, she has to chase him, which aggravates both of them. Sometimes he takes a Xanax for the movements and does not think it helps, other times he asks Grant [name] for 2 tablets but she only gives him 1. She provided additional information in confidence that he drinks 4-5 beers and a shot of liquor a couple of times a week. Usually  when he gets antsy/agitated, he would want a drink. It is unclear if it helps, the mouth movements are all the time.    History on Initial Assessment 03/02/2019: This is a 71 year old right-handed man with a history of OSA, atrial fibrillation on chronic anticoagulation, aortic valve replacement, depression, chronic fatigue syndrome/fibromyalgia, presenting after hospital admission last 02/16/2019 for altered mental status. 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 As this was going on, his wife came to the shop and he does not remember much until they were riding in the car to the hospital. His wife reports that he has had baseline confusion with his chronic fatigue syndrome for more than 15 years, worse when he is tired, but this day was unusual because he was looking all over the place not knowing what he was doing, and he kept asking her the same thing repeatedly. He would usually argue when he goes to the doctor, but this time did not argue, he was not as cognizant of how different/serious it was while he was in the hospital. His wife reported that he had been "bouncing off the edge of a migraine daily for 2-3 weeks prior." He was taking a lot of Tylenol, Aleve, and Maxalt. It appears he was back to baseline in the ER. Bloodwork was normal, UDS and EtOH level negative. I personally reviewed MRI/MRA brain which did not show any acute changes. There was mild chronic microvascular disease. There was scattered small vessel atherosclerotic change without significant stenosis. There was an incidental finding of a 22m focal outpouching at the left MCA bifurcation, suspicious for aneurysm. His wake and drowsy EEG was normal. Symptoms felt due to transient global amnesia or migraine. No further recurrence of similar significant confusion.  They report a complicated history since his early [id number] has been diagnosed as chronic fatigue syndrome (CFS). He takes Effexor. His wife states it has been more than 15 years where he has been confused and kind of forgetful. 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 He would have days where he speaks and thinks better, figuring out things faster, usually when he has an adrenaline burst, then  would crash for a couple of days. If he gets overstimulated or surprised by a loud sound or too much information, he would "start going somewhere else" and get confused. He got lost in a store one time. It can last 1-2 hours or a whole day before he is back to his usual self. 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 He has had migraines since childhood, usually with stabbing pain in the left temporal or retroorbital regions. If he does not stop them, they may go on all day. He would have tunnel vision and see black shimmering objects, with nausea and light sensitivity, tunnel vision. His left eye would almost shut. He has been taking Maxalt every other day for the past few weeks. He has never been on a preventative medication. He took perphenazine in the past but it caused tongue swelling. He took it a week or so ago and his tongue swelled up again. They report balance issues for the past 1.5 years, he would have  extreme fatigue where he feels like he would pass out, occasional vertical diplopia. He was in the hospital while visiting the beach in 08/2018. They fell since his cardiac surgery in 2018, "more funny things have been going on since then." He does not drive. His mother and maternal grandmother had migraines. He denies any focal numbness/tingling/weakness, dysphagia. Sleep is good with CPAP machine.   Diagnostic Data: MRI brain without contrast in 12/2020 did not show any acute changes, there was mild chronic microvascular disease in the right paracentral pons, stable chronic microhemorrhage in the left frontal lobe.  Wake and sleep EEG in 12/2020 was normal  Bloodwork for CK, aldolase, ceruloplasmin, copper, PTH, ferritin, sedimentation rate, ANA, antiphospholipid antibody, lupus anticoagulant, RPR normal in 06/2021  PAST MEDICAL HISTORY: Past Medical History:  Diagnosis Date   Allergic rhinitis    Anxiety    Arthritis    Asthma    Bladder  tumor    Chronic fatigue    COVID-19 05/22/2021   Depression    06/30/17 Pt denies being depressed, reports Effexor is taken for Chronic Fatigue    Dyspnea    Enlarged prostate    Fibromyalgia    GERD (gastroesophageal reflux disease)    Headache    History of chronic bronchitis    History of migraine    History of toxic encephalopathy    Hypothyroidism    OSA on CPAP    CPAP 14   PAF (paroxysmal atrial fibrillation) ([facility]) CARDIOLOGIST -- DR [name]   DX OCT 2013   Pneumonia    S/P AVR (aortic valve replacement) and aortoplasty    Sensitiveness to light    Unspecified essential hypertension    Urethral tumor    PROSTATIC    MEDICATIONS: Current Outpatient Medications on File Prior to Visit  Medication Sig Dispense Refill   acetaminophen (TYLENOL) 500 MG tablet Take 500 mg by mouth 2 (two) times daily as needed for headache (or pain).     albuterol (VENTOLIN HFA) 108 (90 Base) MCG/ACT inhaler INHALE 2 PUFFS INTO THE LUNGS EVERY 6 HOURS AS NEEDED FOR WHEEZING OR SHORTNESS OF BREATH 18 g 12   ALPRAZolam (XANAX) 0.25 MG tablet Take 1 tablet (0.25 mg total) by mouth 3 (three) times daily as needed for anxiety. 15 tablet 0   aluminum hydroxide-magnesium carbonate (GAVISCON) 95-358 MG/15ML SUSP Take 15 mLs by mouth 3 (three) times daily as needed for indigestion or heartburn.     azelastine (ASTELIN) 137 MCG/SPRAY nasal spray Place 1-2 sprays into the nose 2 (two) times daily as needed for rhinitis or allergies.      benzonatate (TESSALON) 200 MG capsule Take 1 capsule (200 mg total) by mouth 3 (three) times daily as needed for cough. 60 capsule 3   budesonide-formoterol (SYMBICORT) 160-4.5 MCG/ACT inhaler INHALE 2 PUFFS INTO THE LUNGS IN THE MORNING AND AT BEDTIME 10.2 g 12   cetirizine (ZYRTEC) 10 MG tablet Take 10 mg by mouth every morning.     colchicine (COLCRYS) 0.6 MG tablet Take 0.6 mg by mouth every morning.     dextromethorphan (DELSYM) 30 MG/5ML liquid Take 30 mg by mouth  daily as needed for cough.     diclofenac Sodium (VOLTAREN) 1 % GEL Apply 2 g topically at bedtime as needed (to affected areas- for arthritis pain.).     diphenhydrAMINE (BENADRYL) 25 mg capsule Take 25 mg by mouth daily as needed for allergies.     Erenumab-aooe (AIMOVIG) 140 MG/ML SOAJ Inject 140 mg  into the skin every 28 (twenty-eight) days. 1 mL 11   Ferrous Sulfate (IRON) 142 (45 Fe) MG TBCR Take 142 mg by mouth in the morning and at bedtime.     Fiber 500 MG CAPS Take 1,000 mg by mouth in the morning.     finasteride (PROSCAR) 5 MG tablet Take 5 mg by mouth at bedtime.      Guaifenesin (MUCINEX MAXIMUM STRENGTH) 1200 MG TB12 Take 1,200 mg by mouth 2 (two) times daily as needed (cough).     ibuprofen (ADVIL) 200 MG tablet Take 400 mg by mouth 2 (two) times daily as needed for headache (or pain).     ipratropium (ATROVENT) 0.03 % nasal spray Place 2 sprays into both nostrils 2 (two) times daily as needed for rhinitis.     ipratropium-albuterol (DUONEB) 0.5-2.5 (3) MG/3ML SOLN Take 3 mLs by nebulization every 6 (six) hours as needed. 360 mL 3   levothyroxine (SYNTHROID, LEVOTHROID) 112 MCG tablet Take 112 mcg by mouth daily before breakfast.      liothyronine (CYTOMEL) 25 MCG tablet Take 25 mcg by mouth daily before breakfast.     losartan (COZAAR) 50 MG tablet TAKE 1 TABLET BY MOUTH EVERY DAY 30 tablet 6   Menthol, Topical Analgesic, (BLUE-EMU MAXIMUM STRENGTH EX) Apply 1 application topically 3 (three) times daily as needed (arthritis pain.).     methocarbamol (ROBAXIN) 750 MG tablet Take 1 tablet (750 mg total) by mouth 4 (four) times daily as needed (use for muscle cramps/pain). 30 tablet 2   metoprolol tartrate (LOPRESSOR) 50 MG tablet TAKE 1 TABLET(50 MG) BY MOUTH TWICE DAILY 180 tablet 3   Multiple Vitamin (MULTIVITAMIN WITH MINERALS) TABS tablet Take 1 tablet by mouth daily with breakfast.     naproxen sodium (ALEVE) 220 MG tablet Take 220 mg by mouth 2 (two) times daily as needed  (pain/headache).     olopatadine (PATANOL) 0.1 % ophthalmic solution Place 1 drop into both eyes 2 (two) times daily as needed for allergies.     omeprazole (PRILOSEC) 40 MG capsule Take 1 capsule (40 mg total) by mouth 2 (two) times daily. 180 capsule 1   PRESCRIPTION MEDICATION See admin instructions. CPAP- At bedtime     rivaroxaban (XARELTO) 20 MG TABS tablet TAKE 1 TABLET(20 MG) BY MOUTH DAILY WITH SUPPER 90 tablet 3   rizatriptan (MAXALT) 10 MG tablet TAKE 1 TABLET BY MOUTH AS NEEDED FOR MIGRAINE. MAY REPEAT IN 2 HOURS IF NEEDED 30 tablet 0   saccharomyces boulardii (FLORASTOR) 250 MG capsule Take 250 mg by mouth every morning.     sodium chloride (OCEAN) 0.65 % SOLN nasal spray Place 1 spray into both nostrils as needed for congestion.     tamsulosin (FLOMAX) 0.4 MG CAPS capsule Take 0.4 mg by mouth in the morning and at bedtime.     Testosterone Cypionate 200 MG/ML SOLN Inject 200 mg into the muscle See admin instructions. Inject 200 mg (1 ml) intramuscularly once every 7-10 days     venlafaxine (EFFEXOR-XR) 150 MG 24 hr capsule Take 150 mg by mouth at bedtime.     zolpidem (AMBIEN) 10 MG tablet Take 10 mg by mouth at bedtime as needed for sleep.     Current Facility-Administered Medications on File Prior to Visit  Medication Dose Route Frequency Provider Last Rate Last Admin   methylPREDNISolone acetate (DEPO-MEDROL) injection (RADIOLOGY ONLY) 120 mg  120 mg Intramuscular Once Maryjane Hurter, MD  ALLERGIES: Allergies  Allergen Reactions   Perphenazine Swelling and Other (See Comments)    Tongue swelling   Amiodarone Swelling and Other (See Comments)    Tongue swelling   Codeine Itching   Prednisone Itching and Other (See Comments)    CAN TOLERATE DEPO. PREDNISONE BY MOUTH CAUSES MOOD CHANGES AND ITCHING.    Tramadol Other (See Comments)    Delusions/hallucinations from high doses     FAMILY HISTORY: Family History  Problem Relation Age of Onset   Aortic aneurysm  Mother 36       cause of death   Other Father        motor vehicle accident   Heart disease Other        family history    SOCIAL HISTORY: Social History   Socioeconomic History   Marital status: Married    Spouse name: Not on file   Number of children: Not on file   Years of education: Not on file   Highest education level: Not on file  Occupational History   Occupation: disabled    Comment: owner of buisness  Tobacco Use   Smoking status: Former    Packs/day: 0.50    Years: 27.00    Total pack years: 13.50    Types: Cigarettes    Start date: 46    Quit date: 10/21/1979    Years since quitting: 42.9   Smokeless tobacco: Never  Vaping Use   Vaping Use: Never used  Substance and Sexual Activity   Alcohol use: Yes    Alcohol/week: 4.0 standard drinks of alcohol    Types: 4 Cans of beer per week    Comment: drinks "as much as I could" - 2-3 beers up to 8-10   Drug use: Never   Sexual activity: Not Currently  Other Topics Concern   Not on file  Social History Narrative   Right handed   One story home   Drinks no caffeine   Social Determinants of Health   Financial Resource Strain: Unknown (11/22/2021)   Overall Financial Resource Strain (CARDIA)    Difficulty of Paying Living Expenses: Patient refused  Food Insecurity: No Food Insecurity (11/22/2021)   Hunger Vital Sign    Worried About Running Out of Food in the Last Year: Never true    Ran Out of Food in the Last Year: Never true  Transportation Needs: No Transportation Needs (11/22/2021)   PRAPARE - Hydrologist (Medical): No    Lack of Transportation (Non-Medical): No  Physical Activity: Unknown (11/22/2021)   Exercise Vital Sign    Days of Exercise per Week: 4 days    Minutes of Exercise per Session: Not on file  Stress: Unknown (11/22/2021)   [place]    Feeling of Stress : Patient refused  Social Connections:  Unknown (11/22/2021)   Social Connection and Isolation Panel [NHANES]    Frequency of Communication with Friends and Family: More than three times a week    Frequency of Social Gatherings with Friends and Family: Once a week    Attends Religious Services: Patient refused    Marine scientist or Organizations: No    Attends Music therapist: Not on file    Marital Status: Married  Human resources officer Violence: Not on file     PHYSICAL EXAM: Vitals:   09/10/22 0825  BP: (!) 156/103  [name]: 91  SpO2: 97%   General:  No acute distress, wearing sunglasses in the office Head:  Normocephalic/atraumatic Skin/Extremities: No rash, no edema Neurological Exam: alert and awake. No aphasia or dysarthria. Fund of knowledge is appropriate.Attention and concentration are normal.  Cranial nerves: Pupils equal, round. Extraocular movements intact with no nystagmus. Visual fields full.  No facial asymmetry.  Motor: Bulk and tone normal, muscle strength 5/5 throughout with no pronator drift.   Finger to nose testing intact.  Gait narrow-based and steady, good arm swing, able to tandem walk adequately. He has repetitive chewing mouth movements in the office today, very restless tapping right foot. There is decreased finger taps on right hand, good foot taps. No tremor.   IMPRESSION: This is a 71 yo RH man with a history of OSA, atrial fibrillation on chronic anticoagulation, aortic valve replacement, depression, chronic fatigue syndrome/fibromyalgia, who had an episode of confusion last April 2020 and December 2020. MRI brain and EEG unremarkable. Differentials include complicated migraines, TGA, versus seizure, he has had an increase in headaches around the time of both episodes. No further confusional episodes since 2020, he is on Aimovig for migraine prevention with overall stable symptoms, prn Maxalt for rescue. Main concern are the abnormal involuntary movements of unclear etiology. Repeat  brain MRI without contrast have been unrevealing. Chorea bloodwork normal. We discussed weaning off Venlafaxine and assess if this is contributing. We also discussed starting clonazepam 0.'5mg'$  qhs for symptomatic treatment, side effects discussed, increase as tolerated in 1 week. He is understandably frustrated about how he has been feeling. Discussed holding off on Xanax and Ambien as he starts the clonazepam. We discussed referral to our Movement Disorders specialist Dr. Carles Collet for evaluation. Follow-up in 3 months, call for any changes.    Thank you for allowing me to participate in his care.  Please do not hesitate to call for any questions or concerns.    Ellouise Newer, M.D.   CC: Dr. Kenton Kingfisher

## 2022-09-14 DIAGNOSIS — G4733 Obstructive sleep apnea (adult) (pediatric): Secondary | ICD-10-CM | POA: Diagnosis not present

## 2022-09-17 DIAGNOSIS — G4733 Obstructive sleep apnea (adult) (pediatric): Secondary | ICD-10-CM | POA: Diagnosis not present

## 2022-09-18 ENCOUNTER — Other Ambulatory Visit: Payer: Self-pay | Admitting: Family Medicine

## 2022-09-18 ENCOUNTER — Ambulatory Visit
Admission: RE | Admit: 2022-09-18 | Discharge: 2022-09-18 | Disposition: A | Discharge status: Home / Self Care | Payer: Medicare Other | Source: Ambulatory Visit | Attending: Family Medicine | Admitting: Family Medicine

## 2022-09-18 DIAGNOSIS — R053 Chronic cough: Secondary | ICD-10-CM

## 2022-09-18 DIAGNOSIS — R059 Cough, unspecified: Secondary | ICD-10-CM | POA: Diagnosis not present

## 2022-09-19 ENCOUNTER — Encounter: Payer: Self-pay | Admitting: Neurology

## 2022-09-22 ENCOUNTER — Telehealth: Payer: Self-pay

## 2022-09-22 NOTE — Telephone Encounter (Signed)
See phone note

## 2022-09-22 NOTE — Telephone Encounter (Signed)
Sent a mychart message to pt and wife

## 2022-09-22 NOTE — Telephone Encounter (Signed)
[name] called in stating the patient has now decided not to continue taking the clonazepam. The side effects is what they are worried about. They would like to find out if there is any issues quitting it and if there is something else he can take instead?

## 2022-09-22 NOTE — Telephone Encounter (Signed)
Pt wife called no answer left a voice mail per DPR that Dr Tat stated Okay to stop it.  He has an appt with Dr Tat next month and will see if there are any recommendations at that time for further testing and/or meds

## 2022-09-22 NOTE — Telephone Encounter (Signed)
Ok to go back to what he was taking, thanks

## 2022-09-22 NOTE — Telephone Encounter (Signed)
Pt wife called in asking if he needs it can he take a xanax during the day  and ambien at night now being off the clonazepam?

## 2022-09-24 ENCOUNTER — Other Ambulatory Visit: Payer: Self-pay | Admitting: Neurology

## 2022-09-25 ENCOUNTER — Telehealth: Payer: Self-pay | Admitting: Neurology

## 2022-09-25 ENCOUNTER — Other Ambulatory Visit: Payer: Self-pay

## 2022-09-25 DIAGNOSIS — G43109 Migraine with aura, not intractable, without status migrainosus: Secondary | ICD-10-CM

## 2022-09-25 DIAGNOSIS — E291 Testicular hypofunction: Secondary | ICD-10-CM | POA: Diagnosis not present

## 2022-09-25 MED ORDER — VENLAFAXINE HCL ER 75 MG PO CP24: ORAL_CAPSULE | ORAL | 0 refills | Status: DC

## 2022-09-25 NOTE — Telephone Encounter (Signed)
Pt wife called and states that she needs to speak to a nurse about her husband and the Venlafaxine medication. She states that he is crying ( not usually emotional) and they have something that is really bad getting ready to happen in the family and she is not sure that he will be able to handle it with the change in the medication. She wants to know if we can keep him on the medication until the end of DEC  please call

## 2022-09-25 NOTE — Telephone Encounter (Signed)
Ok to keep him on the Venlafaxine, go back on same dose if she feels comfortable, thanks

## 2022-09-25 NOTE — Telephone Encounter (Signed)
Called pateint and gave Dr. Delice Lesch recommendations. Sent in perscription

## 2022-09-26 MED ORDER — VENLAFAXINE HCL ER 150 MG PO CP24: 150.0000 mg | ORAL_CAPSULE | Freq: Every day | ORAL | 6 refills | Status: DC

## 2022-09-26 NOTE — Addendum Note (Signed)
Addended by: Cameron Sprang on: 09/26/2022 11:26 AM   Modules accepted: Orders

## 2022-09-30 ENCOUNTER — Other Ambulatory Visit: Payer: Self-pay | Admitting: Neurology

## 2022-09-30 DIAGNOSIS — G43109 Migraine with aura, not intractable, without status migrainosus: Secondary | ICD-10-CM

## 2022-10-01 ENCOUNTER — Ambulatory Visit: Payer: Medicare Other | Admitting: Surgery

## 2022-10-01 ENCOUNTER — Other Ambulatory Visit (HOSPITAL_COMMUNITY): Payer: Self-pay | Admitting: Surgery

## 2022-10-01 DIAGNOSIS — M6208 Separation of muscle (nontraumatic), other site: Secondary | ICD-10-CM

## 2022-10-01 DIAGNOSIS — Z8719 Personal history of other diseases of the digestive system: Secondary | ICD-10-CM

## 2022-10-01 DIAGNOSIS — R1033 Periumbilical pain: Secondary | ICD-10-CM

## 2022-10-01 DIAGNOSIS — K660 Peritoneal adhesions (postprocedural) (postinfection): Secondary | ICD-10-CM

## 2022-10-02 ENCOUNTER — Ambulatory Visit (HOSPITAL_COMMUNITY)
Admission: RE | Admit: 2022-10-02 | Discharge: 2022-10-02 | Disposition: A | Discharge status: Home / Self Care | Payer: Medicare Other | Source: Ambulatory Visit | Attending: Surgery | Admitting: Surgery

## 2022-10-02 DIAGNOSIS — Z8719 Personal history of other diseases of the digestive system: Secondary | ICD-10-CM | POA: Diagnosis not present

## 2022-10-02 DIAGNOSIS — R1033 Periumbilical pain: Secondary | ICD-10-CM | POA: Insufficient documentation

## 2022-10-02 DIAGNOSIS — K660 Peritoneal adhesions (postprocedural) (postinfection): Secondary | ICD-10-CM | POA: Insufficient documentation

## 2022-10-02 DIAGNOSIS — Z9889 Other specified postprocedural states: Secondary | ICD-10-CM | POA: Insufficient documentation

## 2022-10-02 DIAGNOSIS — M6208 Separation of muscle (nontraumatic), other site: Secondary | ICD-10-CM | POA: Diagnosis not present

## 2022-10-02 DIAGNOSIS — K565 Intestinal adhesions [bands], unspecified as to partial versus complete obstruction: Secondary | ICD-10-CM | POA: Diagnosis not present

## 2022-10-02 MED ORDER — IOHEXOL 350 MG/ML SOLN
75.0000 mL | Freq: Once | INTRAVENOUS | Status: AC | PRN
  Administered 2022-10-02: 75 mL via INTRAVENOUS

## 2022-10-06 ENCOUNTER — Encounter: Payer: Self-pay | Admitting: Internal Medicine

## 2022-10-06 DIAGNOSIS — Z8719 Personal history of other diseases of the digestive system: Secondary | ICD-10-CM | POA: Diagnosis not present

## 2022-10-06 DIAGNOSIS — M6208 Separation of muscle (nontraumatic), other site: Secondary | ICD-10-CM | POA: Diagnosis not present

## 2022-10-07 ENCOUNTER — Other Ambulatory Visit: Payer: Self-pay | Admitting: Adult Health

## 2022-10-07 NOTE — Telephone Encounter (Signed)
Ok to refill Tessalon perles 200 mg, # 60, 1 three time daiy as needed for cough, ref x 5

## 2022-10-07 NOTE — Telephone Encounter (Signed)
Dr. Annamaria Boots, please see pt's recent messages regarding his cough. Thanks.

## 2022-10-08 MED ORDER — BENZONATATE 200 MG PO CAPS: 200.0000 mg | ORAL_CAPSULE | Freq: Three times a day (TID) | ORAL | 5 refills | Status: DC | PRN

## 2022-10-14 DIAGNOSIS — G4733 Obstructive sleep apnea (adult) (pediatric): Secondary | ICD-10-CM | POA: Diagnosis not present

## 2022-10-15 DIAGNOSIS — J019 Acute sinusitis, unspecified: Secondary | ICD-10-CM | POA: Diagnosis not present

## 2022-10-15 DIAGNOSIS — B9689 Other specified bacterial agents as the cause of diseases classified elsewhere: Secondary | ICD-10-CM | POA: Diagnosis not present

## 2022-10-21 ENCOUNTER — Telehealth: Payer: Self-pay | Admitting: Neurology

## 2022-10-21 ENCOUNTER — Encounter: Payer: Self-pay | Admitting: Neurology

## 2022-10-21 MED ORDER — VENLAFAXINE HCL ER 150 MG PO CP24: 150.0000 mg | ORAL_CAPSULE | Freq: Every day | ORAL | 6 refills | Status: DC

## 2022-10-21 MED ORDER — RIZATRIPTAN BENZOATE 10 MG PO TABS: ORAL_TABLET | ORAL | 1 refills | Status: DC

## 2022-10-21 NOTE — Telephone Encounter (Signed)
1. Which medications need refilled? (List name and dosage, if known) rizatriptan and venalfaxine  2. Which pharmacy/location is medication to be sent to? (include street and city if local pharmacy) [facility]  [name] also dripped off Aimovig paperwork that needs to be sent in each year.

## 2022-10-21 NOTE — Telephone Encounter (Signed)
RX sent in for pt, Paperwork given to Dr Delice Lesch

## 2022-10-22 ENCOUNTER — Other Ambulatory Visit: Payer: Self-pay | Admitting: Neurology

## 2022-10-22 ENCOUNTER — Other Ambulatory Visit: Payer: Self-pay

## 2022-10-22 DIAGNOSIS — G43109 Migraine with aura, not intractable, without status migrainosus: Secondary | ICD-10-CM

## 2022-10-22 MED ORDER — VENLAFAXINE HCL ER 75 MG PO CP24: 75.0000 mg | ORAL_CAPSULE | Freq: Every day | ORAL | 1 refills | Status: DC

## 2022-10-28 NOTE — Progress Notes (Signed)
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 Rosalyn Gess exam in 2020 versus 2022, this was fairly recent in onset.  He was exposed, however, to antipsychotics in the past, which cannot be ignored especially since this was a typical antipsychotic.  -Because the patient has right greater than left symptoms, I do think we should go ahead and do an MRI with contrast.  He has had several done without contrast.  -We will go ahead and do labs for CAG repeats (HD labs) for completeness sake.  -CBGD can start with unilateral chorea, but generally we see much more rigidity associated with this.  I do not see that in him, but we will go ahead and do a DaTscan.  -Discussed medication and the risks of medications like Ingrezza, particularly prolongation of QT.  We looked at his EKG in the office and his QTc was normal in November.  Discussed Ingrezza and starting at low-dose, 40 mg.  Patient was very interested in this medication.  Samples provided.  He will take 40 mg daily.  Risk, benefits, side effects discussed.  -As I was leaving, patient's wife mentioned that patient could drink very heavily and patient did not disagree.  Patient stated that he may leave here and drink 10 beers to get rid of some of the symptoms.  In that regard, we especially discussed potentiation of side effects of Ingrezza.  We discussed risks of death.  Patient and wife are both aware of risks and agreeable.  Patient was offered resources for alcohol use/abuse and declined.  -Follow-up with myself or Dr. Karel Jarvis after the above has been completed Subjective:   Grant [name] was seen today in the movement disorders clinic for neurologic consultation at the request of Dr. Karel Jarvis.  The consultation is for the evaluation of abnormal involuntary movements.  Records made available to me are reviewed.  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  She followed him for several years for this.  When she saw him in March, 2022, the patient/wife is complaining about "jerking/twitching/exaggerated gestures, mostly on the right side.  They complained that the patient was biting his tongue and the patients tongue was involuntarily sticking out of the mouth.  The speech was "thick."  He also noted lack of dexterity in the hands and dropping things and weight loss of 37 pounds over 3 months.  Dr. 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  Patient had a normal EEG.  Patient had lab work including ANA, CPK (78) sed rate (1), ferritin (124.6), ceruloplasmin (20), RPR (negative) and others that were unremarkable.  EMG done in October, 2022 just demonstrated right ulnar neuropathy at the elbow.  Patient was last seen by Dr. Karel Jarvis in November, 2023.  His movements have gotten worse.  He developed double vision.  He has been treating some of the movements/agitation with alcohol.  He was also on Xanax.  They decided to hold the Xanax and Ambien and start clonazepam for the movements.  His last MRI of the brain was done on September 06, 2022.  This was done without gadolinium.  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  Wife reports that pt has been on effexor for 20 years.  Wife states that Dr. Karel Jarvis tried to decrease the effexor dose and it helped the movements on the R side some but they were unable to wean further.    Pt reports today that his R leg has moved around "since I was a boy but it has taken on a life of its own" sometime after his heart surgery in 2018.  He started having movements in the mouth around the same time.  He states that it is mostly the R side but "if I'm irritated the L side  will participate."  It moves in the sleep.  He can control it if paying attention.  It he works hard at [employer], movements will be better through the rest of the day.  There is no fam hx of movement d/o.  Perphenazine is on allergy list and they state that he was on it for migraine.  they insist it was perphenazine and not promethazine that he was taking for migraine but then developed an allergy of angioedema.  They state that he didn't have tongue movements with it but rather tongue swelling.  States that this was around 2007 and "I took it a lot."  Pt states he took it for 10 years but wife thinks it was 1-2 years.    He had an older brother who is now deceased due to MI.  Mom deceased due to aortic aneurysm at the age of 29.  Dad got killed in MVA.     ALLERGIES:   Allergies  Allergen Reactions   Perphenazine Swelling and Other (See Comments)    Tongue swelling   Amiodarone Swelling and Other (See Comments)    Tongue swelling   Codeine Itching   Prednisone Itching and Other (See Comments)    CAN TOLERATE DEPO. PREDNISONE BY MOUTH CAUSES MOOD CHANGES AND ITCHING.    Tramadol Other (See Comments)    Delusions/hallucinations from high doses     CURRENT MEDICATIONS:  Current Meds  Medication Sig   acetaminophen (TYLENOL) 500 MG tablet Take 500 mg by mouth 2 (two) times daily as needed for headache (or pain).   albuterol (VENTOLIN HFA) 108 (90 Base) MCG/ACT inhaler INHALE 2 PUFFS INTO THE LUNGS EVERY 6 HOURS AS NEEDED FOR WHEEZING OR SHORTNESS OF BREATH   ALPRAZolam (XANAX) 0.25 MG tablet Take 1 tablet (0.25 mg total) by mouth 3 (three) times daily as needed for anxiety.   aluminum hydroxide-magnesium carbonate (GAVISCON) 95-358 MG/15ML SUSP Take 15 mLs by mouth 3 (three) times daily as needed for indigestion or heartburn.   azelastine (ASTELIN) 137 MCG/SPRAY nasal spray Place 1-2 sprays into the nose 2 (two) times daily as needed for rhinitis or allergies.    budesonide-formoterol  (SYMBICORT) 160-4.5 MCG/ACT inhaler INHALE 2 PUFFS INTO THE LUNGS IN THE MORNING AND AT BEDTIME   cetirizine (ZYRTEC) 10 MG tablet Take 10 mg by mouth every morning.   colchicine (COLCRYS) 0.6 MG tablet Take 0.6 mg by mouth every morning.   dextromethorphan (DELSYM) 30 MG/5ML liquid Take 30 mg by mouth daily as needed for cough.   diclofenac Sodium (VOLTAREN) 1 % GEL Apply 2 g topically at bedtime as needed (to affected areas- for arthritis pain.).   diphenhydrAMINE (BENADRYL) 25 mg capsule Take 25 mg by mouth daily as needed for allergies.   Erenumab-aooe (AIMOVIG) 140 MG/ML SOAJ Inject 140 mg into the skin every 28 (twenty-eight) days.   Ferrous  Sulfate (IRON) 142 (45 Fe) MG TBCR Take 142 mg by mouth in the morning and at bedtime.   Fiber 500 MG CAPS Take 1,000 mg by mouth in the morning.   finasteride (PROSCAR) 5 MG tablet Take 5 mg by mouth at bedtime.    Guaifenesin (MUCINEX MAXIMUM STRENGTH) 1200 MG TB12 Take 1,200 mg by mouth 2 (two) times daily as needed (cough).   ibuprofen (ADVIL) 200 MG tablet Take 400 mg by mouth 2 (two) times daily as needed for headache (or pain).   ipratropium (ATROVENT) 0.03 % nasal spray Place 2 sprays into both nostrils 2 (two) times daily as needed for rhinitis.   ipratropium-albuterol (DUONEB) 0.5-2.5 (3) MG/3ML SOLN Take 3 mLs by nebulization every 6 (six) hours as needed.   levofloxacin (LEVAQUIN) 750 MG tablet Take 750 mg by mouth daily.   levothyroxine (SYNTHROID, LEVOTHROID) 112 MCG tablet Take 112 mcg by mouth daily before breakfast.    liothyronine (CYTOMEL) 25 MCG tablet Take 25 mcg by mouth daily before breakfast.   losartan (COZAAR) 50 MG tablet TAKE 1 TABLET BY MOUTH EVERY DAY   Menthol, Topical Analgesic, (BLUE-EMU MAXIMUM STRENGTH EX) Apply 1 application topically 3 (three) times daily as needed (arthritis pain.).   methocarbamol (ROBAXIN) 750 MG tablet Take 1 tablet (750 mg total) by mouth 4 (four) times daily as needed (use for muscle  cramps/pain).   metoprolol tartrate (LOPRESSOR) 50 MG tablet TAKE 1 TABLET(50 MG) BY MOUTH TWICE DAILY   Multiple Vitamin (MULTIVITAMIN WITH MINERALS) TABS tablet Take 1 tablet by mouth daily with breakfast.   naproxen sodium (ALEVE) 220 MG tablet Take 220 mg by mouth 2 (two) times daily as needed (pain/headache).   olopatadine (PATANOL) 0.1 % ophthalmic solution Place 1 drop into both eyes 2 (two) times daily as needed for allergies.   omeprazole (PRILOSEC) 40 MG capsule Take 1 capsule (40 mg total) by mouth 2 (two) times daily.   PRESCRIPTION MEDICATION See admin instructions. CPAP- At bedtime   rivaroxaban (XARELTO) 20 MG TABS tablet TAKE 1 TABLET(20 MG) BY MOUTH DAILY WITH SUPPER   rizatriptan (MAXALT) 10 MG tablet TAKE 1 TABLET BY MOUTH AS NEEDED FOR MIGRAINE. MAY REPEAT IN 2 HOURS IF NEEDED   saccharomyces boulardii (FLORASTOR) 250 MG capsule Take 250 mg by mouth every morning.   sodium chloride (OCEAN) 0.65 % SOLN nasal spray Place 1 spray into both nostrils as needed for congestion.   tamsulosin (FLOMAX) 0.4 MG CAPS capsule Take 0.4 mg by mouth in the morning and at bedtime.   Testosterone Cypionate 200 MG/ML SOLN Inject 200 mg into the muscle See admin instructions. Inject 200 mg (1 ml) intramuscularly once every 7-10 days   venlafaxine XR (EFFEXOR-XR) 75 MG 24 hr capsule Take 1 capsule (75 mg total) by mouth daily with breakfast.   zolpidem (AMBIEN) 10 MG tablet Take 10 mg by mouth at bedtime as needed for sleep.   Current Facility-Administered Medications for the 10/29/22 encounter (Office Visit) with Marsia Cino, Octaviano Batty, DO  Medication   methylPREDNISolone acetate (DEPO-MEDROL) injection (RADIOLOGY ONLY) 120 mg     Objective:   VITALS:   Vitals:   10/29/22 1000  BP: 112/78  Pulse: (!) 56  SpO2: 98%  Weight: 239 lb 9.6 Grant (108.7 kg)  Height: 5\' 10"  (1.778 m)    GEN:  The patient appears stated age and is in NAD. HEENT:  Normocephalic, atraumatic.  The mucous membranes are  moist. The superficial temporal arteries are without ropiness or  tenderness. CV:  RRR Lungs:  CTAB Neck/HEME:  There are no carotid bruits bilaterally.  Neurological examination:  Orientation: The patient is alert and oriented x3.  Cranial nerves: There is good facial symmetry. Extraocular muscles are intact. The visual fields are full to confrontational testing. The speech is fluent and clear. Soft palate rises symmetrically and there is no tongue deviation. Hearing is intact to conversational tone. Sensation: Sensation is intact to light and pinprick throughout (facial, trunk, extremities). Vibration is intact at the bilateral big toe. There is no extinction with double simultaneous stimulation. There is no sensory dermatomal level identified. Motor: Strength is 5/5 in the bilateral upper and lower extremities.   Shoulder shrug is equal and symmetric.  There is no pronator drift. Deep tendon reflexes: Deep tendon reflexes are 2-/4 at the bilateral biceps, triceps, brachioradialis, patella and trace at the bilateral achilles. Plantar responses are downgoing bilaterally.  Movement examination: Tone: There is nl tone in the UE/LE Abnormal movements: there is chorea in the R>>LUE (only seen in the fingers on the left, whereas it is seen in the arm on the right) and R>>LLE (seen in the entire leg on the right and only seen on the toes on the left).  It is actually quite rare on the L.  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  Gait and Station: The patient has no difficulty arising out of a deep-seated chair without the use of the hands. The patient's stride length is good and he ambulates well.  Movements decrease as he ambulates, but patient states that he is  also concentrating on keeping the movements down.  I have reviewed and interpreted the following labs independently   Chemistry      Component Value Date/Time   NA 138 09/06/2022 1411   NA 138 07/27/2017 1158   K 3.9 09/06/2022 1411   CL 106 09/06/2022 1411   CO2 27 09/06/2022 1411   BUN 10 09/06/2022 1411   BUN 11 07/27/2017 1158   CREATININE 0.94 09/06/2022 1411   CREATININE 0.92 01/03/2022 1109      Component Value Date/Time   CALCIUM 8.9 09/06/2022 1411   ALKPHOS 41 09/06/2022 1411   AST 30 09/06/2022 1411   ALT 28 09/06/2022 1411   BILITOT 0.7 09/06/2022 1411   BILITOT 0.3 05/05/2017 0931      Lab Results  Component Value Date   TSH 0.951 02/16/2019   Lab Results  Component Value Date   WBC 10.2 09/06/2022   HGB 16.8 09/06/2022   HCT 50.2 09/06/2022   MCV 97.5 09/06/2022   PLT 154 09/06/2022     Total time spent on today's visit was 75 minutes, including both face-to-face time and nonface-to-face time.  Time included that spent on review of records (prior notes available to me/labs/imaging if pertinent), discussing treatment and goals, answering patient's questions and coordinating care.  Cc:  Johny Blamer, MD

## 2022-10-29 ENCOUNTER — Ambulatory Visit: Payer: Medicare Other | Admitting: Neurology

## 2022-10-29 ENCOUNTER — Encounter: Payer: Self-pay | Admitting: Neurology

## 2022-10-29 ENCOUNTER — Other Ambulatory Visit (INDEPENDENT_AMBULATORY_CARE_PROVIDER_SITE_OTHER): Payer: Medicare Other

## 2022-10-29 VITALS — BP 112/78 | HR 56 | Ht 70.0 in | Wt 239.6 lb

## 2022-10-29 DIAGNOSIS — Z789 Other specified health status: Secondary | ICD-10-CM

## 2022-10-29 DIAGNOSIS — G255 Other chorea: Secondary | ICD-10-CM

## 2022-10-29 DIAGNOSIS — R251 Tremor, unspecified: Secondary | ICD-10-CM | POA: Diagnosis not present

## 2022-10-29 MED ORDER — VALBENAZINE TOSYLATE 40 MG PO CAPS: ORAL_CAPSULE | ORAL | 0 refills | Status: DC

## 2022-10-29 NOTE — Patient Instructions (Addendum)
A referral to [facility] has been placed for your MRI someone will contact you directly to schedule your appt. They are located at [facility]. Please contact them directly by calling [phone] with any questions regarding your referral.   As we discussed, we are going to do a DaT scan.  We discussed that this is not a diagnostic scan, but will just give [place] some information on dopamine levels in the brain.  Here is some information which may be helpful to you.  Before the Exam  Please tell the nurse, nuclear imaging technician or nuclear medicine physician if you are pregnant, nursing or have reduced liver function. Please also inform us if you have an allergy or sensitivity to iodine.  The test may be completed with those who are allergic to iodine, but may require pre-medication with other medications to help avoid reactions. If you need to cancel the examination, please give [place] at least 24 hours notice.  Before your scan, stop taking these medicines for the length of time shown: Name of Drug Stop Taking  Amoxapine 4 days before  Benztropine  Cogentin 3 days before  Bupropion (Aplenzin, Budeprion, Voxra, Wellbutrin, Zyban) 48 hours before  Buspirone 15 hours before  Citalopram 24 hours before  Cocaine 6 hours before  Escitalopram 24 hours before  Methamphetamine 24 hours before  Methylphenidate (Concerta, Metadate, Methylin, Ritalin) 20 hours before  Paroxetine 24 hours before  Selegilene 48 hours before  Sertraline 3 days before    On the Day of the Exam Drink plenty of fluids and go to the bathroom frequently (and for two days after your exam) Wear loose comfortable clothing, since you will need to lie still for a period of time. Please bring a list of all medications that you are taking; name and dosage. We want to make your waiting time as pleasant as possible. Consider bringing your favorite magazine, book or music player to help you pass the time.  You do  not need to stay at the imaging facility the entire time, between the initial injection and the scan itself.   Please leave your jewelry and valuables at home.  During the Exam The DaTscan once started takes approximately 30-45 minutes. However, following injection of the DaT agent approximately 3-6 hours are required before the agent has achieved appropriate concentration in the brain.  We will inject the DaTscan through an intravenous (IV) line into your arm in the AM, usually around 8-9am, and then you will come back usually in the mid afternoon for the scan. Before the exam, you will receive a drug to allow you to protect the thyroid. For the imaging test, you will be asked to lie on a table and an imaging technologist will position your head in a headrest. A strip of tape or a flexible restraint may be placed around your head to help you to not move your head during the scan. A camera will be positioned above you and you must remain very still for about 30 minute while images are taken. The scanner will be very close to your head, but will not touch your head.

## 2022-10-31 ENCOUNTER — Encounter: Payer: Self-pay | Admitting: Neurology

## 2022-11-01 DIAGNOSIS — R079 Chest pain, unspecified: Secondary | ICD-10-CM | POA: Diagnosis not present

## 2022-11-01 DIAGNOSIS — R001 Bradycardia, unspecified: Secondary | ICD-10-CM | POA: Diagnosis not present

## 2022-11-01 DIAGNOSIS — R0789 Other chest pain: Secondary | ICD-10-CM | POA: Diagnosis not present

## 2022-11-01 DIAGNOSIS — I10 Essential (primary) hypertension: Secondary | ICD-10-CM | POA: Diagnosis not present

## 2022-11-02 ENCOUNTER — Encounter (HOSPITAL_COMMUNITY): Payer: Self-pay

## 2022-11-02 ENCOUNTER — Emergency Department (HOSPITAL_COMMUNITY)
Admission: EM | Admit: 2022-11-02 | Discharge: 2022-11-02 | Disposition: A | Discharge status: Home / Self Care | Payer: Medicare Other | Attending: Emergency Medicine | Admitting: Emergency Medicine

## 2022-11-02 ENCOUNTER — Encounter: Payer: Self-pay | Admitting: Neurology

## 2022-11-02 ENCOUNTER — Other Ambulatory Visit: Payer: Self-pay

## 2022-11-02 ENCOUNTER — Emergency Department (HOSPITAL_COMMUNITY): Payer: Medicare Other

## 2022-11-02 DIAGNOSIS — E039 Hypothyroidism, unspecified: Secondary | ICD-10-CM | POA: Diagnosis not present

## 2022-11-02 DIAGNOSIS — Z79899 Other long term (current) drug therapy: Secondary | ICD-10-CM | POA: Diagnosis not present

## 2022-11-02 DIAGNOSIS — R000 Tachycardia, unspecified: Secondary | ICD-10-CM | POA: Diagnosis not present

## 2022-11-02 DIAGNOSIS — I10 Essential (primary) hypertension: Secondary | ICD-10-CM | POA: Diagnosis not present

## 2022-11-02 DIAGNOSIS — J45909 Unspecified asthma, uncomplicated: Secondary | ICD-10-CM | POA: Diagnosis not present

## 2022-11-02 DIAGNOSIS — R0600 Dyspnea, unspecified: Secondary | ICD-10-CM | POA: Diagnosis not present

## 2022-11-02 DIAGNOSIS — R0789 Other chest pain: Secondary | ICD-10-CM | POA: Diagnosis not present

## 2022-11-02 DIAGNOSIS — R079 Chest pain, unspecified: Secondary | ICD-10-CM

## 2022-11-02 DIAGNOSIS — Z8616 Personal history of COVID-19: Secondary | ICD-10-CM | POA: Diagnosis not present

## 2022-11-02 LAB — BASIC METABOLIC PANEL
Anion gap: 9 (ref 5–15)
BUN: 10 mg/dL (ref 8–23)
CO2: 24 mmol/L (ref 22–32)
Calcium: 8.7 mg/dL — ABNORMAL LOW (ref 8.9–10.3)
Chloride: 103 mmol/L (ref 98–111)
Creatinine, Ser: 0.93 mg/dL (ref 0.61–1.24)
GFR, Estimated: >60 mL/min (ref >60)
Glucose, Bld: 100 mg/dL — ABNORMAL HIGH (ref 70–99)
Potassium: 3.8 mmol/L (ref 3.5–5.1)
Sodium: 136 mmol/L (ref 135–145)

## 2022-11-02 LAB — D-DIMER, QUANTITATIVE: D-Dimer, Quant: 0.65 ug/mL-FEU — ABNORMAL HIGH (ref 0.00–0.50)

## 2022-11-02 LAB — CBC
HCT: 47 % (ref 39.0–52.0)
Hemoglobin: 16.3 g/dL (ref 13.0–17.0)
MCH: 32.5 pg (ref 26.0–34.0)
MCHC: 34.7 g/dL (ref 30.0–36.0)
MCV: 93.6 fL (ref 80.0–100.0)
Platelets: 162 10*3/uL (ref 150–400)
RBC: 5.02 MIL/uL (ref 4.22–5.81)
RDW: 13.2 % (ref 11.5–15.5)
WBC: 7.7 10*3/uL (ref 4.0–10.5)
nRBC: 0 % (ref 0.0–0.2)

## 2022-11-02 LAB — TROPONIN I (HIGH SENSITIVITY)
Troponin I (High Sensitivity): 13 ng/L (ref <18)
Troponin I (High Sensitivity): 11 ng/L (ref <18)

## 2022-11-02 LAB — MAGNESIUM: Magnesium: 1.7 mg/dL (ref 1.7–2.4)

## 2022-11-02 NOTE — ED Notes (Signed)
ED Provider at bedside discussing patients discharge

## 2022-11-02 NOTE — ED Provider Triage Note (Signed)
Emergency Medicine Provider Triage Evaluation Note  Grant [name] , a 72 y.o. male  was evaluated in triage.  Pt complains of palpitations which began yesterday however worsened today.  Prior history of a AAA, he did not take anything for symptomatic control.  Also endorsing some shortness of breath.He describes the chest pain as stabbing pressure to the left side of his chest.   Review of Systems  Positive: Chest pain, palpitations Negative: Fever,   Physical Exam  BP (!) 153/81   Pulse (!) 50   Temp 98.2 F (36.8 C)   Ht '5\' 10"'$  (1.778 m)   Wt 108.7 kg   SpO2 96%   BMI 34.38 kg/m  Gen:   Awake, no distress   Resp:  Normal effort  MSK:   Moves extremities without difficulty  Other:    Medical Decision Making  Medically screening exam initiated at 5:17 PM.  Appropriate orders placed.  [name] was informed that the remainder of the evaluation will be completed by another provider, this initial triage assessment does not replace that evaluation, and the importance of remaining in the ED until their evaluation is complete.     Janeece Fitting, PA-C 11/02/22 1722

## 2022-11-02 NOTE — ED Triage Notes (Signed)
Pt arrived via [facility] from home left sided chest pain started yesterday and SOB. Pt recent had encephalopathy, but when he gets stressed it hard for him to say stuff. Pt was started on ingezza 40 mg last week that can cause chest discomfort. Pt took ASA '324mg'$  at home today

## 2022-11-02 NOTE — ED Provider Notes (Signed)
[facility] EMERGENCY DEPARTMENT Provider Note   CSN: [id number] Arrival date & time: 11/02/22  1653     History {Add pertinent medical, surgical, social history, OB history to HPI:1} Chief Complaint  Patient presents with  . Chest Pain    Grant [name] is a 72 y.o. male with asthma, GERD, paroxysmal A-fib, status post AVR, OSA, HTN, hypothyroidism, GERD, history of syncope, chronic pericarditis, diverticulitis, gout, HLD, migraines, IDA, hypercoagulable state who presents with chest pain.   Pt complains of palpitations which began yesterday however worsened today. Started ingrezza for possible chorea vs TD by neurology Dr. Carles Collet on Thursday. Friday started to feel "strange," Saturday felt "cold and clammy," with "discomfort" in the chest and a little SOB, central chest pressure for "awhile." BP was in 150s/80s, HR in 50s. Called paramedics who did EKG which showed sinus bradycardia and patient felt improved. At 3:30 PM today had chest pains, pressure. Also reports recent diagnosis of sinus infection, started on 2nd round of augmentin that started on 10/31/22. Hasn't had any GI symptoms, no cough or f/c. Patient also started with new tingling in his R hand that started in the last couple of days, but it feels normal right now and comes only intermittently. He states he has a shoulder impingement and thinks it is related to that. Did not have this tingling when he saw neurology on 10/29/22. Also endorses that the right side of his face "feels weird" like it doesn't feel the same as the left side. Has never had a stroke before. Has MRI w/ contrast scheduled o/p for his new chorea symptoms but hasn't had it yet.  Last echo in 5/23 e/ EF 55-60%. ***  Chest Pain      Home Medications Prior to Admission medications   Medication Sig Start Date End Date Taking? Authorizing Provider  acetaminophen (TYLENOL) 500 MG tablet Take 500 mg by mouth 2 (two) times daily as needed for  headache (or pain).    [provider]  albuterol (VENTOLIN HFA) 108 (90 Base) MCG/ACT inhaler INHALE 2 PUFFS INTO THE LUNGS EVERY 6 HOURS AS NEEDED FOR WHEEZING OR SHORTNESS OF BREATH 07/07/22   Young, Kasandra Knudsen, MD  ALPRAZolam Duanne Moron) 0.25 MG tablet Take 1 tablet (0.25 mg total) by mouth 3 (three) times daily as needed for anxiety. 10/13/20   Aline August, MD  aluminum hydroxide-magnesium carbonate (GAVISCON) 95-358 MG/15ML SUSP Take 15 mLs by mouth 3 (three) times daily as needed for indigestion or heartburn.    [provider]  azelastine (ASTELIN) 137 MCG/SPRAY nasal spray Place 1-2 sprays into the nose 2 (two) times daily as needed for rhinitis or allergies.     [provider]  budesonide-formoterol (SYMBICORT) 160-4.5 MCG/ACT inhaler INHALE 2 PUFFS INTO THE LUNGS IN THE MORNING AND AT BEDTIME 06/02/22   Young, Tarri Fuller D, MD  cetirizine (ZYRTEC) 10 MG tablet Take 10 mg by mouth every morning.    [provider]  colchicine (COLCRYS) 0.6 MG tablet Take 0.6 mg by mouth every morning.    [provider]  dextromethorphan (DELSYM) 30 MG/5ML liquid Take 30 mg by mouth daily as needed for cough.    [provider]  diclofenac Sodium (VOLTAREN) 1 % GEL Apply 2 g topically at bedtime as needed (to affected areas- for arthritis pain.).    [provider]  diphenhydrAMINE (BENADRYL) 25 mg capsule Take 25 mg by mouth daily as needed for allergies.    [provider]  Erenumab-aooe (AIMOVIG) 140 MG/ML SOAJ Inject 140 mg into the skin every 28 (twenty-eight) days. 01/15/22   Cameron Sprang, MD  Ferrous Sulfate (IRON) 142 (45 Fe) MG TBCR Take 142 mg by mouth in the morning and at bedtime.    [provider]  Fiber 500 MG CAPS Take 1,000 mg by mouth in the morning.    [provider]  finasteride (PROSCAR) 5 MG tablet Take 5 mg by mouth at bedtime.  01/14/14   [provider]  Guaifenesin (MUCINEX MAXIMUM  STRENGTH) 1200 MG TB12 Take 1,200 mg by mouth 2 (two) times daily as needed (cough).    [provider]  ibuprofen (ADVIL) 200 MG tablet Take 400 mg by mouth 2 (two) times daily as needed for headache (or pain).    [provider]  ipratropium (ATROVENT) 0.03 % nasal spray Place 2 sprays into both nostrils 2 (two) times daily as needed for rhinitis.    [provider]  ipratropium-albuterol (DUONEB) 0.5-2.5 (3) MG/3ML SOLN Take 3 mLs by nebulization every 6 (six) hours as needed. 08/25/22   Deneise Lever, MD  levofloxacin (LEVAQUIN) 750 MG tablet Take 750 mg by mouth daily. 10/15/22   [provider]  levothyroxine (SYNTHROID, LEVOTHROID) 112 MCG tablet Take 112 mcg by mouth daily before breakfast.     [provider]  liothyronine (CYTOMEL) 25 MCG tablet Take 25 mcg by mouth daily before breakfast.    [provider]  losartan (COZAAR) 50 MG tablet TAKE 1 TABLET BY MOUTH EVERY DAY 10/01/20   [name], MD  Menthol, Topical Analgesic, (BLUE-EMU MAXIMUM STRENGTH EX) Apply 1 application topically 3 (three) times daily as needed (arthritis pain.).    [provider]  methocarbamol (ROBAXIN) 750 MG tablet Take 1 tablet (750 mg total) by mouth 4 (four) times daily as needed (use for muscle cramps/pain). 06/11/22   [name], MD  metoprolol tartrate (LOPRESSOR) 50 MG tablet TAKE 1 TABLET(50 MG) BY MOUTH TWICE DAILY 09/08/22   Josue Hector, MD  Multiple Vitamin (MULTIVITAMIN WITH MINERALS) TABS tablet Take 1 tablet by mouth daily with breakfast.    [provider]  naproxen sodium (ALEVE) 220 MG tablet Take 220 mg by mouth 2 (two) times daily as needed (pain/headache).    [provider]  olopatadine (PATANOL) 0.1 % ophthalmic solution Place 1 drop into both eyes 2 (two) times daily as needed for allergies.    [provider]  omeprazole (PRILOSEC) 40 MG capsule Take 1 capsule (40 mg total) by mouth 2 (two)  times daily. 10/31/16   Josue Hector, MD  PRESCRIPTION MEDICATION See admin instructions. CPAP- At bedtime    [provider]  rivaroxaban (XARELTO) 20 MG TABS tablet TAKE 1 TABLET(20 MG) BY MOUTH DAILY WITH SUPPER 09/08/22   Josue Hector, MD  rizatriptan (MAXALT) 10 MG tablet TAKE 1 TABLET BY MOUTH AS NEEDED FOR MIGRAINE. MAY REPEAT IN 2 HOURS IF NEEDED 10/21/22   Cameron Sprang, MD  saccharomyces boulardii (FLORASTOR) 250 MG capsule Take 250 mg by mouth every morning.    [provider]  sodium chloride (OCEAN) 0.65 % SOLN nasal spray Place 1 spray into both nostrils as needed for congestion.    [provider]  tamsulosin (FLOMAX) 0.4 MG CAPS capsule Take 0.4 mg by mouth in the morning and at bedtime.    [provider]  Testosterone Cypionate 200 MG/ML SOLN Inject 200 mg into the muscle See admin  instructions. Inject 200 mg (1 ml) intramuscularly once every 7-10 days    [provider]  valbenazine (INGREZZA) 40 MG capsule Samples of this drug were given to the patient, quantity 3, 10/29/22   Tat, Eustace Quail, DO  venlafaxine XR (EFFEXOR-XR) 75 MG 24 hr capsule Take 1 capsule (75 mg total) by mouth daily with breakfast. 10/22/22   Cameron Sprang, MD  zolpidem (AMBIEN) 10 MG tablet Take 10 mg by mouth at bedtime as needed for sleep. 11/06/21   [provider]      Allergies    Perphenazine, Amiodarone, Codeine, Prednisone, and Tramadol    Review of Systems   Review of Systems  Cardiovascular:  Positive for chest pain.   Review of systems {pos/neg:18640::"Negative","Positive"} for ***.  A 10 point review of systems was performed and is negative unless otherwise reported in HPI.  Physical Exam Updated Vital Signs BP (!) 153/88   Pulse (!) 57   Temp 98.2 F (36.8 C)   Resp 19   Ht '5\' 10"'$  (1.778 m)   Wt 108.7 kg   SpO2 99%   BMI 34.38 kg/m  Physical Exam General: Normal appearing {Desc; male/male:11659}, lying in bed.  HEENT:  PERRLA, Sclera anicteric, MMM, trachea midline.  Cardiology: RRR, no murmurs/rubs/gallops. BL radial and DP pulses equal bilaterally.  Resp: Normal respiratory rate and effort. CTAB, no wheezes, rhonchi, crackles.  Abd: Soft, non-tender, non-distended. No rebound tenderness or guarding.  GU: Deferred. MSK: No peripheral edema or signs of trauma. Extremities without deformity or TTP. No cyanosis or clubbing. Skin: warm, dry. No rashes or lesions. Back: No CVA tenderness Neuro: A&Ox4, CNs II-XII grossly intact. MAEs. Sensation grossly intact.  Psych: Normal mood and affect.   ED Results / Procedures / Treatments   Labs (all labs ordered are listed, but only abnormal results are displayed) Labs Reviewed  BASIC METABOLIC PANEL - Abnormal; Notable for the following components:      Result Value   Glucose, Bld 100 (*)    Calcium 8.7 (*)    All other components within normal limits  CBC  TROPONIN I (HIGH SENSITIVITY)  TROPONIN I (HIGH SENSITIVITY)    EKG None  Radiology DG Chest Port 1 View  Result Date: 11/02/2022 CLINICAL DATA:  Chest pain and dyspnea EXAM: PORTABLE CHEST 1 VIEW COMPARISON:  09/18/2022 chest radiograph. FINDINGS: Chronic discontinuity in multiple sternotomy wires. Surgical clips overlie the upper right chest. Stable cardiomediastinal silhouette with normal heart size. No pneumothorax. No pleural effusion. Lungs appear clear, with no acute consolidative airspace disease and no pulmonary edema. IMPRESSION: No active disease. Electronically Signed   By: Ilona Sorrel M.D.   On: 11/02/2022 18:03    Procedures Procedures  {Document cardiac monitor, telemetry assessment procedure when appropriate:1}  Medications Ordered in ED Medications - No data to display  ED Course/ Medical Decision Making/ A&P                          Medical Decision Making Amount and/or Complexity of Data Reviewed Labs: ordered. Decision-making details documented in ED Course. Radiology:   Decision-making details documented in ED Course.    This patient presents to the ED for concern of ***, this involves an extensive number of treatment options, and is a complaint that carries with it a high risk of complications and morbidity.  I considered the following differential and admission for this acute, potentially life threatening condition.   MDM:    ***  Clinical Course as of 11/02/22 2248  Nancy Fetter Nov 02, 2022  1914 DG Chest [place] 1 View Chronic discontinuity in multiple sternotomy wires. Surgical clips overlie the upper right chest. Stable cardiomediastinal silhouette with normal heart size. No pneumothorax. No pleural effusion. Lungs appear clear, with no acute consolidative airspace disease and no pulmonary edema.  IMPRESSION: No active disease.   [HN]  1915 CBC wnl [HN]  0102 Basic metabolic panel(!) wnl [HN]  1915 Troponin I (High Sensitivity): 13 [HN]  2057 D/w pharmacy who will investigate ingrezza [HN]  2139 D-Dimer, Quant(!): 0.65 Neg age-adjusted dimer [HN]  2140 Magnesium: 1.7 wnl [HN]  2140 Per pharmacist, ingrezza would likely not cause his symptoms [HN]  2140 Troponin I (High Sensitivity): 11 flat [HN]    Clinical Course User Index [HN] Audley Hose, MD    Labs: I Ordered, and personally interpreted labs.  The pertinent results include:  ***  Imaging Studies ordered: I ordered imaging studies including *** I independently visualized and interpreted imaging. I agree with the radiologist interpretation  Additional history obtained from ***.  External records from outside source obtained and reviewed including ***  Cardiac Monitoring: .The patient was maintained on a cardiac monitor.  I personally viewed and interpreted the cardiac monitored which showed an underlying rhythm of: ***  Reevaluation: After the interventions noted above, I reevaluated the patient and found that they have  :{resolved/improved/worsened:23923::"improved"}  Social Determinants of Health: .***  Disposition:  ***  Co morbidities that complicate the patient evaluation . Past Medical History:  Diagnosis Date  . Allergic rhinitis   . Anxiety   . Arthritis   . Asthma   . Bladder tumor   . Chronic fatigue   . COVID-19 05/22/2021  . Depression    06/30/17 Pt denies being depressed, reports Effexor is taken for Chronic Fatigue   . Dyspnea   . Enlarged prostate   . Fibromyalgia   . GERD (gastroesophageal reflux disease)   . Headache   . History of chronic bronchitis   . History of migraine   . History of toxic encephalopathy   . Hypothyroidism   . OSA on CPAP    CPAP 14  . PAF (paroxysmal atrial fibrillation) ([place]) CARDIOLOGIST -- DR [name]   DX OCT 2013  . Pneumonia   . S/P AVR (aortic valve replacement) and aortoplasty   . 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 Unspecified essential hypertension   . Urethral tumor    PROSTATIC     Medicines No orders of the defined types were placed in this encounter.   I have reviewed the patients home medicines and have made adjustments as needed  Problem List / ED Course: Problem List Items Addressed This Visit   None        {Document critical care time when appropriate:1} {Document review of labs and clinical decision tools ie heart score, Chads2Vasc2 etc:1}  {Document your independent review of radiology images, and any outside records:1} {Document your discussion with family members, caretakers, and with consultants:1} {Document social determinants of health affecting pt's care:1} {Document your decision making why or why not admission, treatments were needed:1}  This note was created using dictation software, which may contain spelling or grammatical errors.

## 2022-11-02 NOTE — Discharge Instructions (Addendum)
Thank you for coming to [facility] Emergency Department. You were seen for chest pain. We did an exam, labs, and imaging, and these showed no acute findings.   Please follow up with your cardiologist within 1-2 weeks.  Do not hesitate to return to the ED or call 911 if you experience: -Worsening symptoms -Shortness of breath -Lightheadedness, passing out -Fevers/chills -Anything else that concerns you

## 2022-11-03 ENCOUNTER — Telehealth: Payer: Self-pay | Admitting: Cardiovascular Disease

## 2022-11-03 NOTE — Telephone Encounter (Signed)
Patient c/o Palpitations:  High priority if patient c/o lightheadedness, shortness of breath, or chest pain  How long have you had palpitations/irregular HR/ Afib? Are you having the symptoms now?   No  Are you currently experiencing lightheadedness, SOB or CP?   SOB and coughing (wife states possible sinus infection)  Do you have a history of afib (atrial fibrillation) or irregular heart rhythm?   Yes  Have you checked your BP or HR? (document readings if available):  Not yet today  Are you experiencing any other symptoms?  Off and on dizziness  Wife stated patient's neurologist started him on Ingrezza on Thursday pm.  Wife stated patient starting having chest pressure of Friday.  Wife stated they called EMT on Saturday but did go to hospital.

## 2022-11-03 NOTE — Progress Notes (Addendum)
Cardiology Office Note:    Date:  11/06/2022   ID:  [name], DOB Apr 24, 1951, MRN [id number]  PCP:  Shirline Frees, MD   [facility] Providers Cardiologist:  [name], MD     Referring MD: Ludwig Clarks, DO   Chief Complaint: palpitations  History of Present Illness:    Grant [name] is a 72 y.o. male with a hx of PAF chronically anticoagulation with rivaroxaban, HTN, OSA on CPAP, AVR, and movement disorder  He underwent Bentall surgery with biological AVR for bicuspid aortic valve in 2018 with Dr. Lucianne Lei trigt. No CAD on cath 06/12/17 prior to surgery. 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   Had recurrent PAF 07/28/2017 converted with IV Cardizem and seen by EP, Dr. [name] who recommended amiodarone 200 mg daily.   History of angioedema, 2020 seen by ER physician and given steroids and Benadryl.  Was not on ACE or ARB. Cannot take oral steroids due to mental status changes.   Admitted January 2023 with febrile illness and grew out strep mitis/oralis on blood cultures. 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 A gated cardiac CTA was also suspicious but felt that circumferential thickening could be related to pledget material from the time of surgery. There was no pseudoaneurysm or obvious fistula seen. He was followed by infectious disease.  Developed PAF/flutter around the time of PICC line change. CXR showed PICC line at Cavoatrial junction. He was cardioverted in the EP lab by Dr. Quentin Ore on 11/28/2021.  Last cardiology clinic visit was 09/08/2022 with Dr. [name] at which time he had been seen a few days prior in the ED with blurred vision.  He had tardive dyskinesia which was new and prominent at the office visit. Had lipsmacking and constant UE/LE movements.  Medication list did not reveal any obvious culprits.  He was planning to see neurology in a few  days. He was maintaining NSR.   Today, he is here with his wife for follow-up of palpitations that led to ED visit. Started having chest tightness/palpitations on 11/01/22 after starting Ingrezza prescribed by Dr. Carles Collet for suspicion of chorea (he continues to undergo testing). Was told by Dr. Carles Collet that chest pain was a potential side effect of the medication. EMS called, vitals were 159/86, HR 56 by EMS they reported PACs on EKG, he did not go to the hospital.  The following day symptoms occurred again and he was advised by EMS to go to the hospital. EKG revealed sinus rhythm at 64 bpm and he had negative troponin x 2, unremarkable CXR. Has had no further problems since stopping the medication. Continues to fight a sinus infection 09/2022, has tried multiple antibiotics. Unintentional weight loss since hernia surgery 2022, says he does not have a good appetite but tries to make himself eat. He denies chest pain, shortness of breath, lower extremity edema, fatigue, palpitations, melena, hematuria, hemoptysis, diaphoresis, weakness, presyncope, syncope, orthopnea, and PND.  Past Medical History:  Diagnosis Date   Allergic rhinitis    Anxiety    Arthritis    Asthma    Bladder tumor    Chronic fatigue    COVID-19 05/22/2021   Depression    06/30/17 Pt denies being depressed, reports Effexor is taken for Chronic Fatigue    Dyspnea    Enlarged prostate    Fibromyalgia    GERD (gastroesophageal reflux disease)    Headache  History of chronic bronchitis    History of migraine    History of toxic encephalopathy    Hypothyroidism    OSA on CPAP    CPAP 14   PAF (paroxysmal atrial fibrillation) (HCC) CARDIOLOGIST -- DR [name]   DX OCT 2013   Pneumonia    S/P AVR (aortic valve replacement) and aortoplasty    Sensitiveness to light    Unspecified essential hypertension    Urethral tumor    PROSTATIC    Past Surgical History:  Procedure Laterality Date   BENTALL PROCEDURE N/A 07/02/2017    Procedure: BENTALL PROCEDURE;  Surgeon: Ivin Poot, MD;  Location: [place];  Service: Open Heart Surgery;  Laterality: N/A;  WITH CIRC ARREST   BENTALL PROCEDURE  07/02/2017   w/ Aortic valve replacement    by Dr. Prescott Gum   BIOPSY  02/06/2021   Procedure: BIOPSY;  Surgeon: Clarene Essex, MD;  Location: [facility];  Service: Endoscopy;;   BUBBLE STUDY  11/01/2021   Procedure: BUBBLE STUDY;  Surgeon: Geralynn Rile, MD;  Location: [place];  Service: Cardiovascular;;   CARDIOVERSION N/A 11/28/2021   Procedure: CARDIOVERSION (CATH LAB);  Surgeon: Vickie Epley, MD;  Location: [place] [facility];  Service: Cardiovascular;  Laterality: N/A;   COLONOSCOPY     COLONOSCOPY WITH PROPOFOL N/A 02/06/2021   Procedure: COLONOSCOPY WITH PROPOFOL;  Surgeon: Clarene Essex, MD;  Location: [facility];  Service: Endoscopy;  Laterality: N/A;   CORONARY ANGIOPLASTY  06/12/2017   CYSTOSCOPY W/ RETROGRADES Bilateral 06/15/2013   Procedure: CYSTOSCOPY WITH BILATERAL RETROGRADE PYELOGRAM  BLADDER BIOPSY, PROSTATIC URETHRAL BIOPSY, ;  Surgeon: Molli Hazard, MD;  Location: [facility];  Service: Urology;  Laterality: Bilateral;   ESOPHAGOGASTRODUODENOSCOPY (EGD) WITH PROPOFOL N/A 03/11/2018   Procedure: ESOPHAGOGASTRODUODENOSCOPY (EGD) WITH PROPOFOL;  Surgeon: Clarene Essex, MD;  Location: [place];  Service: Endoscopy;  Laterality: N/A;   EXCISION OF MESH N/A 06/11/2022   Procedure: REMOVAL OF OLD MESH;  Surgeon: [name], MD;  Location: [facility];  Service: General;  Laterality: N/A;   LAPAROSCOPIC CHOLECYSTECTOMY  01/14/2001   PROCTOSCOPY N/A 06/12/2021   Procedure: RIGID PROCTOSCOPY;  Surgeon: [name], MD;  Location: [facility];  Service: General;  Laterality: N/A;   RIGHT/LEFT HEART CATH AND CORONARY ANGIOGRAPHY N/A 06/12/2017   Procedure: RIGHT/LEFT HEART CATH AND CORONARY ANGIOGRAPHY;  Surgeon: Larey Dresser, MD;  Location: [place] [facility];  Service:  Cardiovascular;  Laterality: N/A;   TEE WITHOUT CARDIOVERSION N/A 07/02/2017   Procedure: TRANSESOPHAGEAL ECHOCARDIOGRAM (TEE);  Surgeon: Prescott Gum, Collier Salina, MD;  Location: [place];  Service: Open Heart Surgery;  Laterality: N/A;   TEE WITHOUT CARDIOVERSION N/A 11/01/2021   Procedure: TRANSESOPHAGEAL ECHOCARDIOGRAM (TEE);  Surgeon: Geralynn Rile, MD;  Location: [place];  Service: Cardiovascular;  Laterality: N/A;   UMBILICAL HERNIA REPAIR  01/14/2008   VENTRAL HERNIA REPAIR N/A 06/11/2022   Procedure: LAPAROSCOPIC VENTRAL HERNIA WITH LYSIS OF ADHESIONS;  Surgeon: [name], MD;  Location: [facility];  Service: General;  Laterality: N/A;  WITH MESH   WISDOM TOOTH EXTRACTION      Current Medications: Current Meds  Medication Sig   acetaminophen (TYLENOL) 500 MG tablet Take 500 mg by mouth 2 (two) times daily as needed for headache (or pain).   albuterol (VENTOLIN HFA) 108 (90 Base) MCG/ACT inhaler INHALE 2 PUFFS INTO THE LUNGS EVERY 6 HOURS AS NEEDED FOR WHEEZING OR SHORTNESS OF BREATH   ALPRAZolam (XANAX) 0.25 MG  tablet Take 1 tablet (0.25 mg total) by mouth 3 (three) times daily as needed for anxiety.   aluminum hydroxide-magnesium carbonate (GAVISCON) 95-358 MG/15ML SUSP Take 15 mLs by mouth 3 (three) times daily as needed for indigestion or heartburn.   amoxicillin-clavulanate (AUGMENTIN) 875-125 MG tablet Take 1 tablet by mouth 2 (two) times daily.   azelastine (ASTELIN) 137 MCG/SPRAY nasal spray Place 1-2 sprays into the nose 2 (two) times daily as needed for rhinitis or allergies.    budesonide-formoterol (SYMBICORT) 160-4.5 MCG/ACT inhaler INHALE 2 PUFFS INTO THE LUNGS IN THE MORNING AND AT BEDTIME   cetirizine (ZYRTEC) 10 MG tablet Take 10 mg by mouth every morning.   colchicine (COLCRYS) 0.6 MG tablet Take 0.6 mg by mouth every morning.   dextromethorphan (DELSYM) 30 MG/5ML liquid Take 30 mg by mouth daily as needed for cough.   diclofenac Sodium (VOLTAREN) 1 % GEL Apply 2 g  topically at bedtime as needed (to affected areas- for arthritis pain.).   diphenhydrAMINE (BENADRYL) 25 mg capsule Take 25 mg by mouth daily as needed for allergies.   Erenumab-aooe (AIMOVIG) 140 MG/ML SOAJ Inject 140 mg into the skin every 28 (twenty-eight) days.   Ferrous Sulfate (IRON) 142 (45 Fe) MG TBCR Take 142 mg by mouth in the morning and at bedtime.   Fiber 500 MG CAPS Take 1,000 mg by mouth in the morning.   finasteride (PROSCAR) 5 MG tablet Take 5 mg by mouth at bedtime.    Guaifenesin (MUCINEX MAXIMUM STRENGTH) 1200 MG TB12 Take 1,200 mg by mouth 2 (two) times daily as needed (cough).   ibuprofen (ADVIL) 200 MG tablet Take 400 mg by mouth 2 (two) times daily as needed for headache (or pain).   ipratropium (ATROVENT) 0.03 % nasal spray Place 2 sprays into both nostrils 2 (two) times daily as needed for rhinitis.   ipratropium-albuterol (DUONEB) 0.5-2.5 (3) MG/3ML SOLN Take 3 mLs by nebulization every 6 (six) hours as needed.   levothyroxine (SYNTHROID, LEVOTHROID) 112 MCG tablet Take 112 mcg by mouth daily before breakfast.    liothyronine (CYTOMEL) 25 MCG tablet Take 25 mcg by mouth daily before breakfast.   losartan (COZAAR) 50 MG tablet TAKE 1 TABLET BY MOUTH EVERY DAY   Menthol, Topical Analgesic, (BLUE-EMU MAXIMUM STRENGTH EX) Apply 1 application topically 3 (three) times daily as needed (arthritis pain.).   methocarbamol (ROBAXIN) 750 MG tablet Take 1 tablet (750 mg total) by mouth 4 (four) times daily as needed (use for muscle cramps/pain).   metoprolol tartrate (LOPRESSOR) 50 MG tablet TAKE 1 TABLET(50 MG) BY MOUTH TWICE DAILY   Multiple Vitamin (MULTIVITAMIN WITH MINERALS) TABS tablet Take 1 tablet by mouth daily with breakfast.   naproxen sodium (ALEVE) 220 MG tablet Take 220 mg by mouth 2 (two) times daily as needed (pain/headache).   olopatadine (PATANOL) 0.1 % ophthalmic solution Place 1 drop into both eyes 2 (two) times daily as needed for allergies.   omeprazole  (PRILOSEC) 40 MG capsule Take 1 capsule (40 mg total) by mouth 2 (two) times daily.   PRESCRIPTION MEDICATION See admin instructions. CPAP- At bedtime   rivaroxaban (XARELTO) 20 MG TABS tablet TAKE 1 TABLET(20 MG) BY MOUTH DAILY WITH SUPPER   rizatriptan (MAXALT) 10 MG tablet TAKE 1 TABLET BY MOUTH AS NEEDED FOR MIGRAINE. MAY REPEAT IN 2 HOURS IF NEEDED   saccharomyces boulardii (FLORASTOR) 250 MG capsule Take 250 mg by mouth every morning.   sodium chloride (OCEAN) 0.65 % SOLN nasal spray Place  1 spray into both nostrils as needed for congestion.   tamsulosin (FLOMAX) 0.4 MG CAPS capsule Take 0.4 mg by mouth in the morning and at bedtime.   Testosterone Cypionate 200 MG/ML SOLN Inject 200 mg into the muscle See admin instructions. Inject 200 mg (1 ml) intramuscularly once every 7-10 days   venlafaxine XR (EFFEXOR-XR) 75 MG 24 hr capsule Take 1 capsule (75 mg total) by mouth daily with breakfast.   zolpidem (AMBIEN) 10 MG tablet Take 10 mg by mouth at bedtime as needed for sleep.   Current Facility-Administered Medications for the 11/06/22 encounter (Office Visit) with Ann Maki, Lanice Schwab, NP  Medication   methylPREDNISolone acetate (DEPO-MEDROL) injection (RADIOLOGY ONLY) 120 mg     Allergies:   Ingrezza [valbenazine tosylate], Perphenazine, Amiodarone, Codeine, Prednisone, and Tramadol   Social History   Socioeconomic History   Marital status: Married    Spouse name: Not on file   Number of children: Not on file   Years of education: Not on file   Highest education level: Not on file  Occupational History   Occupation: disabled    Comment: owner of buis[employer]  Tobacco Use   Smoking status: Former    Packs/day: 0.50    Years: 27.00    Total pack years: 13.50    Types: Cigarettes    Start date: 1970    Quit date: 10/21/1979    Years since quitting: 43.0   Smokeless tobacco: Never  Vaping Use   Vaping Use: Never used  Substance and Sexual Activity   Alcohol use: Yes     Alcohol/week: 4.0 standard drinks of alcohol    Types: 4 Cans of beer per week    Comment: drinks "as much as I could" - 2-3 beers up to 8-10   Drug use: Never   Sexual activity: Not Currently  Other Topics Concern   Not on file  Social History Narrative   Right handed   One story home   Drinks no caffeine   Lives with wife    Social Determinants of Health   Financial Resource Strain: Unknown (11/22/2021)   Overall Financial Resource Strain (CARDIA)    Difficulty of Paying Living Expenses: Patient refused  Food Insecurity: No Food Insecurity (11/22/2021)   Hunger Vital Sign    Worried About Running Out of Food in the Last Year: Never true    Ran Out of Food in the Last Year: Never true  Transportation Needs: No Transportation Needs (11/22/2021)   PRAPARE - Hydrologist (Medical): No    Lack of Transportation (Non-Medical): No  Physical Activity: Unknown (11/22/2021)   Exercise Vital Sign    Days of Exercise per Week: 4 days    Minutes of Exercise per Session: Not on file  Stress: Unknown (11/22/2021)   [place]    Feeling of Stress : Patient refused  Social Connections: Unknown (11/22/2021)   Social Connection and Isolation Panel [NHANES]    Frequency of Communication with Friends and Family: More than three times a week    Frequency of Social Gatherings with Friends and Family: Once a week    Attends Religious Services: Patient refused    Marine scientist or Organizations: No    Attends Music therapist: Not on file    Marital Status: Married     Family History: The patient's family history includes Aortic aneurysm (age of onset: 89) in his mother;  Heart disease in an other family member; Other in his father.  ROS:   Please see the history of present illness.  All other systems reviewed and are negative.  Labs/Other Studies Reviewed:    The following studies were  reviewed today:  Echo 02/28/22 1. Left ventricular ejection fraction, by estimation, is 55 to 60%. The  left ventricle has normal function. The left ventricle has no regional  wall motion abnormalities. There is moderate left ventricular hypertrophy.  Left ventricular diastolic  parameters are indeterminate.   2. Right ventricular systolic function is mildly reduced. The right  ventricular size is normal. There is normal pulmonary artery systolic  pressure. The estimated right ventricular systolic pressure is 64.6 mmHg.   3. Left atrial size was mildly dilated.   4. The mitral valve is normal in structure. Trivial mitral valve  regurgitation. No evidence of mitral stenosis.   5. There is a 25 mm bioprosthetic valve present in the aortic position      Aortic valve regurgitation is not visualized. Vmax 3.2 m/s, EOA 1.4  cm^2, DI 0.29, MG 46mHg (stable from prior echo 10/28/21)   6. Aortic root has been replaced. There is mild dilatation of the  ascending aorta, measuring 38 mm.   7. The inferior vena cava is normal in size with greater than 50%  respiratory variability, suggesting right atrial pressure of 3 mmHg.  Coronary CTA 11/04/21 1. Coronary calcium score of 3.9. This was 20th percentile for age-, sex, and race-matched controls.   2. S/p aortic valve replacement with 25 mm Magna and aortic root replacement with concerns for aortic root abscess described above.   3. S/p coronary reimplantation.   4. 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 Mid LAD myocardial bridge (normal)   5. Minimal CAD in the distal RCA (<25%).  R/LHC 06/12/17 . Normal filling pressures, no pulmonary hypertension.  2. Preserved cardiac output.  3. 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  No significant coronary disease.   Recent Labs: 09/06/2022: ALT 28 11/02/2022: BUN 10; Creatinine, Ser 0.93; Hemoglobin 16.3; Magnesium 1.7; Platelets 162; Potassium 3.8; Sodium 136  Recent  Lipid Panel    Component Value Date/Time   CHOL 185 02/17/2019 0714   CHOL 184 05/05/2017 0931   TRIG 234 (H) 02/17/2019 0714   HDL 42 02/17/2019 0714   HDL 45 05/05/2017 0931   CHOLHDL 4.4 02/17/2019 0714   VLDL 47 (H) 02/17/2019 0714   LDLCALC 96 02/17/2019 0714   LDLCALC 94 05/05/2017 0931     Risk Assessment/Calculations:    CHA2DS2-VASc Score = 3  {This indicates a 3.2% annual risk of stroke. The patient's score is based upon: CHF History: 0 HTN History: 1 Diabetes History: 0 Stroke History: 0 Vascular Disease History: 1 Age Score: 1 Gender Score: 0    Physical Exam:    VS:  BP 124/82 (BP Location: Left Arm, Patient Position: Sitting, Cuff Size: Normal)   Pulse 69   Ht '5\' 10"'$  (1.778 m)   Wt 235 lb 9.6 oz (106.9 kg)   SpO2 95%   BMI 33.81 kg/m     Wt Readings from Last 3 Encounters:  11/06/22 235 lb 9.6 oz (106.9 kg)  11/02/22 239 lb 10.2 oz (108.7 kg)  10/29/22 239 lb 9.6 oz (108.7 kg)     GEN:  Obese well developed in no acute distress HEENT: Normal NECK: No JVD; No carotid bruits CARDIAC: RRR, soft systolic murmur. No rubs, gallops  RESPIRATORY:  Clear to auscultation without rales, wheezing or rhonchi  ABDOMEN: Soft, non-tender, non-distended MUSCULOSKELETAL:  No edema; No deformity. 2+ pedal pulses, equal bilaterally SKIN: Warm and dry NEUROLOGIC:  Alert and oriented x 3 PSYCHIATRIC:  Pleasant, somewhat disoriented  EKG:  EKG is not ordered today.     Diagnoses:    1. S/P AVR   2. Essential hypertension   3. PAF (paroxysmal atrial fibrillation) ([place])   4. Chronic anticoagulation   5. OSA (obstructive sleep apnea)   6. Chronic diastolic (congestive) heart failure (HCC)   7. Movement disorder    Assessment and Plan:     Palpitations: Had palpitations felt by wife to be secondary to starting a new medication for movement disorder. This is not listed as a common side effect but patient has had no further symptoms since stopping the  medication. Will defer treatment of movement disorder to Dr. Carles Collet. He is now asymptomatic.   AVR/Bentall procedure: History of bicuspid aortic valve, dilated aortic root s/p AVR Bentall procedure 06/2017 with tissue valve. Echo 02/28/22 with normal LVEF, valve well positioned with no evidence of regurgitation, MG 22 mmHg, stable from previous echo 10/28/21. No evidence of aortic abscess or dissection on CTA 03/2022. No symptoms of worsening aortic valve function. Will get echo in 6 months for follow-up.   PAF: Appears to be maintaining sinus rhythm on exam, HR well-controlled. No bleeding concerns. Continue Xarelto for stroke prevention and metoprolol for rate control.   Chronic HFpEF: LVEF 55 to 60%, indeterminate diastolic parameters, mildly reduced RV systolic function on echo 02/2022.  Volume status difficult to assess due to body habitus, but no obvious signs of volume overload. Weight is stable, has been losing weight for 1 year. No edema, orthopnea, dyspnea, or PND. Continue losartan, metoprolol.   Hypertension: BP is well controlled   OSA: Reports compliance, no concerns today. Managed by pulmonology.   Movement disorder: Feels that palpitations and chest pain were side effects of Ingrezza. Continues to undergo testing with Dr. Carles Collet for movement disorder. Advised her to reach out in the future with questions about medications.      Disposition: 6 months with Dr. [name] with echo prior   Medication Adjustments/Labs and Tests Ordered: Current medicines are reviewed at length with the patient today.  Concerns regarding medicines are outlined above.  Orders Placed This Encounter  Procedures   ECHOCARDIOGRAM COMPLETE   No orders of the defined types were placed in this encounter.   Patient Instructions  Medication Instructions:   Your physician recommends that you continue on your current medications as directed. Please refer to the Current Medication list given to you today.   *If you  need a refill on your cardiac medications before your next appointment, please call your pharmacy*   Lab Work:  None ordered.  If you have labs (blood work) drawn today and your tests are completely normal, you will receive your results only by: [place] (if you have MyChart) OR A paper copy in the mail If you have any lab test that is abnormal or we need to change your treatment, we will call you to review the results.   Testing/Procedures:  Your physician has requested that you have an echocardiogram. Echocardiography is a painless test that uses sound waves to create images of your heart. It provides your doctor with information about the size and shape of your heart and how well your heart's chambers and valves are working. This procedure takes approximately one  hour. There are no restrictions for this procedure. Please do NOT wear cologne, aftershave, or lotions (deodorant is allowed). Please arrive 15 minutes prior to your appointment time.    Follow-Up: At [facility], you and your health needs are our priority.  As part of our continuing mission to provide you with exceptional heart care, we have created designated Provider Care Teams.  These Care Teams include your primary Cardiologist (physician) and Advanced Practice Providers (APPs -  Physician Assistants and Nurse Practitioners) who all work together to provide you with the care you need, when you need it.  We recommend signing up for the patient portal called "MyChart".  Sign up information is provided on this After Visit Summary.  MyChart is used to connect with patients for Virtual Visits (Telemedicine).  Patients are able to view lab/test results, encounter notes, upcoming appointments, etc.  Non-urgent messages can be sent to your provider as well.   To learn more about what you can do with MyChart, go to [email or web address].    Your next appointment:   6 month(s)  Provider:   [name], MD         Signed, Emmaline Life, NP  11/06/2022 12:35 PM    [place]

## 2022-11-03 NOTE — Telephone Encounter (Signed)
Called patient's wife back about her message. She stated that Dr. Carles Collet started patinet on [place] on Thursday, then on Friday night he had chest tightness. They called EMS and they told patient he was fine expect for having a few PACs. Patient stop taking Ingrezza. Patient had chest tightness again on Sunday, so he went to the ED. Patient had labs, chest xray, and EKG. They sent him home to follow-up with his cardiologist. Patient has appointment on Thursday with Christen Bame, NP. Patient wife just wanted to let Dr. [name] know and see if there is anything they need to do before their appointment. Patient's wife stated patient has also last a lot of weight, and he is down to 225 lbs.    Dr. [name] recommends patient to keep follow-up with Sharyn Lull, and no changes at this time since patient's workup was fine in the ED.

## 2022-11-04 DIAGNOSIS — M25512 Pain in left shoulder: Secondary | ICD-10-CM | POA: Diagnosis not present

## 2022-11-04 DIAGNOSIS — M25511 Pain in right shoulder: Secondary | ICD-10-CM | POA: Diagnosis not present

## 2022-11-06 ENCOUNTER — Encounter: Payer: Self-pay | Admitting: Nurse Practitioner

## 2022-11-06 ENCOUNTER — Ambulatory Visit: Payer: Medicare Other | Attending: Nurse Practitioner | Admitting: Nurse Practitioner

## 2022-11-06 VITALS — BP 124/82 | HR 69 | Ht 70.0 in | Wt 235.6 lb

## 2022-11-06 DIAGNOSIS — Z7901 Long term (current) use of anticoagulants: Secondary | ICD-10-CM | POA: Diagnosis not present

## 2022-11-06 DIAGNOSIS — G259 Extrapyramidal and movement disorder, unspecified: Secondary | ICD-10-CM

## 2022-11-06 DIAGNOSIS — I10 Essential (primary) hypertension: Secondary | ICD-10-CM | POA: Diagnosis not present

## 2022-11-06 DIAGNOSIS — I5032 Chronic diastolic (congestive) heart failure: Secondary | ICD-10-CM

## 2022-11-06 DIAGNOSIS — G4733 Obstructive sleep apnea (adult) (pediatric): Secondary | ICD-10-CM

## 2022-11-06 DIAGNOSIS — I480 Paroxysmal atrial fibrillation: Secondary | ICD-10-CM | POA: Diagnosis not present

## 2022-11-06 DIAGNOSIS — Z952 Presence of prosthetic heart valve: Secondary | ICD-10-CM

## 2022-11-06 NOTE — Patient Instructions (Signed)
Medication Instructions:   Your physician recommends that you continue on your current medications as directed. Please refer to the Current Medication list given to you today.   *If you need a refill on your cardiac medications before your next appointment, please call your pharmacy*   Lab Work:  None ordered.  If you have labs (blood work) drawn today and your tests are completely normal, you will receive your results only by: [place] (if you have MyChart) OR A paper copy in the mail If you have any lab test that is abnormal or we need to change your treatment, we will call you to review the results.   Testing/Procedures:  Your physician has requested that you have an echocardiogram. Echocardiography is a painless test that uses sound waves to create images of your heart. It provides your doctor with information about the size and shape of your heart and how well your heart's chambers and valves are working. This procedure takes approximately one hour. There are no restrictions for this procedure. Please do NOT wear cologne, aftershave, or lotions (deodorant is allowed). Please arrive 15 minutes prior to your appointment time.    Follow-Up: At [facility], you and your health needs are our priority.  As part of our continuing mission to provide you with exceptional heart care, we have created designated Provider Care Teams.  These Care Teams include your primary Cardiologist (physician) and Advanced Practice Providers (APPs -  Physician Assistants and Nurse Practitioners) who all work together to provide you with the care you need, when you need it.  We recommend signing up for the patient portal called "MyChart".  Sign up information is provided on this After Visit Summary.  MyChart is used to connect with patients for Virtual Visits (Telemedicine).  Patients are able to view lab/test results, encounter notes, upcoming appointments, etc.  Non-urgent messages can be  sent to your provider as well.   To learn more about what you can do with MyChart, go to [email or web address].    Your next appointment:   6 month(s)  Provider:   [name], MD

## 2022-11-10 ENCOUNTER — Encounter: Payer: Self-pay | Admitting: Neurology

## 2022-11-11 ENCOUNTER — Telehealth: Payer: Self-pay | Admitting: Neurology

## 2022-11-11 NOTE — Telephone Encounter (Signed)
Spoke with patient wife and she states that the Aimovig needs prior auth and the fax number to [place] is [phone]

## 2022-11-12 ENCOUNTER — Telehealth: Payer: Self-pay

## 2022-11-12 LAB — HUNTINGTON DISEASE REPEAT EXP

## 2022-11-12 NOTE — Telephone Encounter (Signed)
Reached out to Dat scan for scheduling and I had Grant [name] reach out to schedule. she said VM is full 11/03/22

## 2022-11-14 ENCOUNTER — Telehealth: Payer: Self-pay | Admitting: Neurology

## 2022-11-14 DIAGNOSIS — G4733 Obstructive sleep apnea (adult) (pediatric): Secondary | ICD-10-CM | POA: Diagnosis not present

## 2022-11-14 NOTE — Telephone Encounter (Signed)
Pts HD testing with 39 repeats, considered equivocal.  >39 would be abnormal

## 2022-11-17 ENCOUNTER — Encounter: Payer: Self-pay | Admitting: Neurology

## 2022-11-18 ENCOUNTER — Telehealth: Payer: Self-pay

## 2022-11-18 NOTE — Telephone Encounter (Signed)
Pt needs a new PA for aimovig ASAP for assistance program

## 2022-11-26 NOTE — Telephone Encounter (Signed)
Following up on PA for aimovig

## 2022-11-26 NOTE — Telephone Encounter (Signed)
Message sent to PA team on 1/29 for Amovig

## 2022-11-28 ENCOUNTER — Telehealth: Payer: Self-pay | Admitting: Neurology

## 2022-11-28 DIAGNOSIS — F3341 Major depressive disorder, recurrent, in partial remission: Secondary | ICD-10-CM | POA: Diagnosis not present

## 2022-11-28 DIAGNOSIS — I10 Essential (primary) hypertension: Secondary | ICD-10-CM | POA: Diagnosis not present

## 2022-11-28 DIAGNOSIS — E782 Mixed hyperlipidemia: Secondary | ICD-10-CM | POA: Diagnosis not present

## 2022-11-28 DIAGNOSIS — E039 Hypothyroidism, unspecified: Secondary | ICD-10-CM | POA: Diagnosis not present

## 2022-11-28 NOTE — Telephone Encounter (Signed)
PA has previously been APPROVED from 04/14/2022-04/15/2023.  Letter has been attached in patient documents.

## 2022-11-28 NOTE — Telephone Encounter (Signed)
Submitted a Prior Authorization request to [facility] for  Aimovig 156m  via CoverMyMeds. Will update once we receive a response.  Key: [id number]

## 2022-11-28 NOTE — Telephone Encounter (Signed)
Grant [name], johns wife called to canx his feb 23rd appt. She wants to know if sai should wait to see aquino after him having his MRI (which is 2/10) and DATscan (2/21). He has several appts and said anything after 12/29/22 will be good for them. Wants to make sure its was okay.

## 2022-11-29 ENCOUNTER — Ambulatory Visit
Admission: RE | Admit: 2022-11-29 | Discharge: 2022-11-29 | Disposition: A | Discharge status: Home / Self Care | Payer: Medicare Other | Source: Ambulatory Visit | Attending: Neurology | Admitting: Neurology

## 2022-11-29 DIAGNOSIS — R410 Disorientation, unspecified: Secondary | ICD-10-CM | POA: Diagnosis not present

## 2022-11-29 DIAGNOSIS — G255 Other chorea: Secondary | ICD-10-CM

## 2022-11-29 MED ORDER — GADOPICLENOL 0.5 MMOL/ML IV SOLN
10.0000 mL | Freq: Once | INTRAVENOUS | Status: AC | PRN
  Administered 2022-11-29: 10 mL via INTRAVENOUS

## 2022-12-01 ENCOUNTER — Telehealth: Payer: Self-pay | Admitting: Neurology

## 2022-12-01 NOTE — Telephone Encounter (Signed)
Reviewed MRI scan, along with old MRI scans.  Patient has left caudate infarct, old.  This was not present in his 2021 scan, nor was it present right when the symptoms started after Dr. Delice Lesch scanned him in March, 2022.  However, it was present at the neck scan in January, 2023 and was chronic at that point.  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  Patient's wife has called Dr. Delice Lesch and she canceled the February appointment and wanted it moved back to March.  Talked with Dr. Delice Lesch and suggested that she try amantadine first.  I think that Xenazine/tetrabenazine/Ingrezza are still possibilities if symptoms are uncontrolled.  Benzodiazepine is not ideal given patient's history of alcohol.  Chelsea, please call patient/wife and let them know that there is nothing new on the scan but that Dr. Delice Lesch and I talked and she will discuss the plan with them at follow-up visit.  It appears they need to make that, but I think they discussed making it in March.

## 2022-12-02 NOTE — Telephone Encounter (Signed)
Called and left message.

## 2022-12-02 NOTE — Telephone Encounter (Signed)
Patient is also scheduled to do Dat Scan on 12/10/22

## 2022-12-02 NOTE — Telephone Encounter (Signed)
Called and spoke to patients wife about the plan to cancel the Dat Scan and make an appointment with Dr. Delice Lesch to discuss next steps in this patients treatment plan. I reached out to scheduling to cancel Dat Scan. Patients wife waiting for call to schedule this appointment with Dr. Delice Lesch

## 2022-12-04 NOTE — Telephone Encounter (Signed)
See other phone note

## 2022-12-04 NOTE — Telephone Encounter (Signed)
Pls put on waitlist for appt in March, thanks

## 2022-12-05 ENCOUNTER — Encounter: Payer: Self-pay | Admitting: Neurology

## 2022-12-08 ENCOUNTER — Encounter: Payer: Self-pay | Admitting: Neurology

## 2022-12-08 DIAGNOSIS — Z8739 Personal history of other diseases of the musculoskeletal system and connective tissue: Secondary | ICD-10-CM | POA: Diagnosis not present

## 2022-12-10 ENCOUNTER — Other Ambulatory Visit (HOSPITAL_COMMUNITY): Payer: Medicare Other

## 2022-12-10 ENCOUNTER — Inpatient Hospital Stay (HOSPITAL_COMMUNITY): Admission: RE | Admit: 2022-12-10 | Payer: Medicare Other | Source: Ambulatory Visit

## 2022-12-11 ENCOUNTER — Encounter: Payer: Self-pay | Admitting: Neurology

## 2022-12-12 ENCOUNTER — Telehealth: Payer: Medicare Other | Admitting: Neurology

## 2022-12-12 DIAGNOSIS — Z86010 Personal history of colonic polyps: Secondary | ICD-10-CM | POA: Diagnosis not present

## 2022-12-12 DIAGNOSIS — R1013 Epigastric pain: Secondary | ICD-10-CM | POA: Diagnosis not present

## 2022-12-12 DIAGNOSIS — K219 Gastro-esophageal reflux disease without esophagitis: Secondary | ICD-10-CM | POA: Diagnosis not present

## 2022-12-13 DIAGNOSIS — G4733 Obstructive sleep apnea (adult) (pediatric): Secondary | ICD-10-CM | POA: Diagnosis not present

## 2022-12-19 ENCOUNTER — Other Ambulatory Visit: Payer: Self-pay | Admitting: Neurology

## 2022-12-19 ENCOUNTER — Encounter: Payer: Self-pay | Admitting: Neurology

## 2022-12-19 ENCOUNTER — Telehealth: Payer: Medicare Other | Admitting: Neurology

## 2022-12-19 VITALS — Ht 70.0 in | Wt 228.0 lb

## 2022-12-19 DIAGNOSIS — G43109 Migraine with aura, not intractable, without status migrainosus: Secondary | ICD-10-CM | POA: Diagnosis not present

## 2022-12-19 DIAGNOSIS — G255 Other chorea: Secondary | ICD-10-CM

## 2022-12-19 MED ORDER — RIZATRIPTAN BENZOATE 10 MG PO TABS: ORAL_TABLET | ORAL | 11 refills | Status: DC

## 2022-12-19 MED ORDER — AIMOVIG 140 MG/ML ~~LOC~~ SOAJ: 1.0000 | SUBCUTANEOUS | 11 refills | Status: DC

## 2022-12-19 MED ORDER — AMANTADINE HCL 100 MG PO CAPS: ORAL_CAPSULE | ORAL | 1 refills | Status: DC

## 2022-12-19 NOTE — Patient Instructions (Signed)
Start the Amantadine '100mg'$ : Take 1 tablet twice a day for 1 week, then increase to 1 tablet three times a day  2. Continue all your other medications. You can check with Dr. Kenton Kingfisher if he would be comfortable refilling your migraine medications since these are pretty stable and would reduce doctor visits  3. Follow-up in 1 year, call for any changes

## 2022-12-19 NOTE — Progress Notes (Signed)
Virtual Visit via Video Note The purpose of this virtual visit is to provide medical care while limiting exposure to the novel coronavirus.    Consent was obtained for video visit:  Yes.   Answered questions that patient had about telehealth interaction:  Yes.     Pt location: Home Physician Location: office Name of referring provider:  Shirline Frees, MD I connected with Grant [name] at patients initiation/request on 12/19/2022 at  2:30 PM EST by video enabled telemedicine application and verified that I am speaking with the correct person using two identifiers. Pt MRN:  [id number] Pt DOB:  May 30, 1951 Video Participants:  Grant [name];  Aura Dials (spouse)   History of Present Illness:  The patient had a virtual video visit on 12/19/2022. Since his last visit with me 4 months ago, he has kindly been seen by our Movement Disorders specialist Dr. Carles Collet who did a careful review of history of imaging. 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 He had a repeat brain MRI with and without contrast done 11/2022 with no acute changes, unchanged small remote infarct in the left caudate nucleus. [facility] disease panel was negative. On further review of imaging dating back to 2020, there were no caudate changes seen in 01/2019 and 12/2020. MRI in 12/2020 was ordered for these symptoms. On brain MRI in 10/2021 and moving forward (04/2022, 08/2022, 11/2022), the small remote infarct in the left caudate head is noted. After discussion with Dr. 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 Recommendations included a trial of Amantadine, and if symptoms still uncontrolled, Xenazine/tetrabenazine/Ingrezza are still possibilities. Benzodiazepine is not ideal given his history of alcohol use. He did try the Ingrezza in January, but the day after  he felt bad with low heart rate, cold sweats, then the day after he had chest pressure and extra beats with HR of 59. He reports he felt erratic heart beat, chest pain. She did not give the dose over the weekend then he had another episode Sunday.   Today they both express a lot of frustration with ongoing care. He states that "where I'm at now, this has been going on for a very long time, I've pretty much had enough, it is not helpful to me to have to take a continued beating to participate in my healthcare program." He saw his other physiciant and was told that he has a lot of problems and takes a lot of medications, "nothing we can do about that unless something got a lot worse." Then talking to another physician, he said "there is only so many tools in the toolbox and we've used it up." He states that he keeps explaining to his wife that these are all cumulative for him and he does not need any more poking, prodding, or questions unless someone has a significant and substantial change. He states "I don't need, want, not the least interested in it." He asks to be left alone. Juliann Pulse adds that when Effexor was reduced to '75mg'$ , they think some of the movements were better, but when he completely stopped medication, there was "a lot of emotional baggage that went with that." They do note that he does better with exercise and hopes to feel better once he is moving around more. He states exercise right now  wipes him out where he is non-functional, "too much stress." He does admit that his migraine medications do help him.    History on Initial Assessment 03/02/2019: This is a 72 year old right-handed man with a history of OSA, atrial fibrillation on chronic anticoagulation, aortic valve replacement, depression, chronic fatigue syndrome/fibromyalgia, presenting after hospital admission last 02/16/2019 for altered mental status. He was in his usual state of health ("a little unusual all the time anyway"), working in  his shop when he stopped doing one thing and could not remember what he was going to do next. As this was going on, his wife came to the shop and he does not remember much until they were riding in the car to the hospital. His wife reports that he has had baseline confusion with his chronic fatigue syndrome for more than 15 years, worse when he is tired, but this day was unusual because he was looking all over the place not knowing what he was doing, and he kept asking her the same thing repeatedly. He would usually argue when he goes to the doctor, but this time did not argue, he was not as cognizant of how different/serious it was while he was in the hospital. His wife reported that he had been "bouncing off the edge of a migraine daily for 2-3 weeks prior." He was taking a lot of Tylenol, Aleve, and Maxalt. It appears he was back to baseline in the ER. Bloodwork was normal, UDS and EtOH level negative. I personally reviewed MRI/MRA brain which did not show any acute changes. There was mild chronic microvascular disease. There was scattered small vessel atherosclerotic change without significant stenosis. There was an incidental finding of a 45m focal outpouching at the left MCA bifurcation, suspicious for aneurysm. His wake and drowsy EEG was normal. Symptoms felt due to transient global amnesia or migraine. No further recurrence of similar significant confusion.  They report a complicated history since his early 72swhich has been diagnosed as chronic fatigue syndrome (CFS). He takes Effexor. His wife states it has been more than 15 years where he has been confused and kind of forgetful. 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 He would have days where he speaks and thinks better, figuring out things faster, usually when he has an adrenaline burst, then would crash for a couple of days. If he gets  overstimulated or surprised by a loud sound or too much information, he would "start going somewhere else" and get confused. He got lost in a store one time. It can last 1-2 hours or a whole day before he is back to his usual self. 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 He has had migraines since childhood, usually with stabbing pain in the left temporal or retroorbital regions. If he does not stop them, they may go on all day. He would have tunnel vision and see black shimmering objects, with nausea and light sensitivity, tunnel vision. His left eye would almost shut. He has been taking Maxalt every other day for the past few weeks. He has never been on a preventative medication. He took perphenazine in the past but it caused tongue swelling. He took it a week or so ago and his tongue swelled up again. They report balance issues for the past 1.5 years, he would have extreme fatigue  where he feels like he would pass out, occasional vertical diplopia. He was in the hospital while visiting the beach in 08/2018. They fell since his cardiac surgery in 2018, "more funny things have been going on since then." He does not drive. His mother and maternal grandmother had migraines. He denies any focal numbness/tingling/weakness, dysphagia. Sleep is good with CPAP machine.   Diagnostic Data: MRI brain without contrast in 12/2020 did not show any acute changes, there was mild chronic microvascular disease in the right paracentral pons, stable chronic microhemorrhage in the left frontal lobe.  Wake and sleep EEG in 12/2020 was normal  Bloodwork for CK, aldolase, ceruloplasmin, copper, PTH, ferritin, sedimentation rate, ANA, antiphospholipid antibody, lupus anticoagulant, RPR normal in 06/2021   Current Outpatient Medications on File Prior to Visit  Medication Sig Dispense Refill   acetaminophen (TYLENOL) 500 MG tablet Take 500 mg by mouth 2 (two) times daily as needed  for headache (or pain).     albuterol (VENTOLIN HFA) 108 (90 Base) MCG/ACT inhaler INHALE 2 PUFFS INTO THE LUNGS EVERY 6 HOURS AS NEEDED FOR WHEEZING OR SHORTNESS OF BREATH 18 g 12   ALPRAZolam (XANAX) 0.25 MG tablet Take 1 tablet (0.25 mg total) by mouth 3 (three) times daily as needed for anxiety. 15 tablet 0   aluminum hydroxide-magnesium carbonate (GAVISCON) 95-358 MG/15ML SUSP Take 15 mLs by mouth 3 (three) times daily as needed for indigestion or heartburn.     azelastine (ASTELIN) 137 MCG/SPRAY nasal spray Place 1-2 sprays into the nose 2 (two) times daily as needed for rhinitis or allergies.      budesonide-formoterol (SYMBICORT) 160-4.5 MCG/ACT inhaler INHALE 2 PUFFS INTO THE LUNGS IN THE MORNING AND AT BEDTIME 10.2 g 12   cetirizine (ZYRTEC) 10 MG tablet Take 10 mg by mouth every morning.     colchicine (COLCRYS) 0.6 MG tablet Take 0.6 mg by mouth every morning.     dextromethorphan (DELSYM) 30 MG/5ML liquid Take 30 mg by mouth daily as needed for cough.     diclofenac Sodium (VOLTAREN) 1 % GEL Apply 2 g topically at bedtime as needed (to affected areas- for arthritis pain.).     diphenhydrAMINE (BENADRYL) 25 mg capsule Take 25 mg by mouth daily as needed for allergies.     Erenumab-aooe (AIMOVIG) 140 MG/ML SOAJ Inject 140 mg into the skin every 28 (twenty-eight) days. 1 mL 11   Ferrous Sulfate (IRON) 142 (45 Fe) MG TBCR Take 142 mg by mouth in the morning and at bedtime.     Fiber 500 MG CAPS Take 1,000 mg by mouth in the morning.     finasteride (PROSCAR) 5 MG tablet Take 5 mg by mouth at bedtime.      Guaifenesin (MUCINEX MAXIMUM STRENGTH) 1200 MG TB12 Take 1,200 mg by mouth 2 (two) times daily as needed (cough).     ibuprofen (ADVIL) 200 MG tablet Take 400 mg by mouth 2 (two) times daily as needed for headache (or pain).     ipratropium (ATROVENT) 0.03 % nasal spray Place 2 sprays into both nostrils 2 (two) times daily as needed for rhinitis.     ipratropium-albuterol (DUONEB) 0.5-2.5  (3) MG/3ML SOLN Take 3 mLs by nebulization every 6 (six) hours as needed. 360 mL 3   levothyroxine (SYNTHROID, LEVOTHROID) 112 MCG tablet Take 112 mcg by mouth daily before breakfast.      liothyronine (CYTOMEL) 25 MCG tablet Take 25 mcg by mouth daily before breakfast.     losartan (  COZAAR) 50 MG tablet TAKE 1 TABLET BY MOUTH EVERY DAY 30 tablet 6   Menthol, Topical Analgesic, (BLUE-EMU MAXIMUM STRENGTH EX) Apply 1 application topically 3 (three) times daily as needed (arthritis pain.).     methocarbamol (ROBAXIN) 750 MG tablet Take 1 tablet (750 mg total) by mouth 4 (four) times daily as needed (use for muscle cramps/pain). 30 tablet 2   metoprolol tartrate (LOPRESSOR) 50 MG tablet TAKE 1 TABLET(50 MG) BY MOUTH TWICE DAILY 180 tablet 3   Multiple Vitamin (MULTIVITAMIN WITH MINERALS) TABS tablet Take 1 tablet by mouth daily with breakfast.     naproxen sodium (ALEVE) 220 MG tablet Take 220 mg by mouth 2 (two) times daily as needed (pain/headache).     olopatadine (PATANOL) 0.1 % ophthalmic solution Place 1 drop into both eyes 2 (two) times daily as needed for allergies.     omeprazole (PRILOSEC) 40 MG capsule Take 1 capsule (40 mg total) by mouth 2 (two) times daily. 180 capsule 1   PRESCRIPTION MEDICATION See admin instructions. CPAP- At bedtime     rivaroxaban (XARELTO) 20 MG TABS tablet TAKE 1 TABLET(20 MG) BY MOUTH DAILY WITH SUPPER 90 tablet 3   rizatriptan (MAXALT) 10 MG tablet TAKE 1 TABLET BY MOUTH AS NEEDED FOR MIGRAINE. MAY REPEAT IN 2 HOURS IF NEEDED 30 tablet 1   saccharomyces boulardii (FLORASTOR) 250 MG capsule Take 250 mg by mouth every morning.     sodium chloride (OCEAN) 0.65 % SOLN nasal spray Place 1 spray into both nostrils as needed for congestion.     tamsulosin (FLOMAX) 0.4 MG CAPS capsule Take 0.4 mg by mouth in the morning and at bedtime.     Testosterone Cypionate 200 MG/ML SOLN Inject 200 mg into the muscle See admin instructions. Inject 200 mg (1 ml) intramuscularly once  every 7-10 days     venlafaxine XR (EFFEXOR-XR) 75 MG 24 hr capsule Take 1 capsule (75 mg total) by mouth daily with breakfast. 90 capsule 1   zolpidem (AMBIEN) 10 MG tablet Take 10 mg by mouth at bedtime as needed for sleep.     Current Facility-Administered Medications on File Prior to Visit  Medication Dose Route Frequency Provider Last Rate Last Admin   methylPREDNISolone acetate (DEPO-MEDROL) injection (RADIOLOGY ONLY) 120 mg  120 mg Intramuscular Once Maryjane Hurter, MD         Observations/Objective:   Vitals:   12/19/22 1319  Weight: 228 lb (103.4 kg)  Height: '5\' 10"'$  (1.778 m)   GEN:  The patient appears stated age and is in NAD.  Neurological examination: Patient is awake, alert, frustrated/agitated. No aphasia or dysarthria. Intact fluency and comprehension. Cranial nerves: Extraocular movements intact. No facial asymmetry. Motor: moves all extremities symmetrically, at least anti-gravity x 4. He has orobuccal movements noted on video.   Assessment and Plan:   This is a 72 yo RH man with a history of OSA, atrial fibrillation on chronic anticoagulation, aortic valve replacement, depression, chronic fatigue syndrome/fibromyalgia, who had an episode of confusion last April 2020 and December 2020. MRI brain and EEG unremarkable. Differentials include complicated migraines, TGA, versus seizure, he has had an increase in headaches around the time of both episodes. No further confusional episodes since 2020, he is on Aimovig for migraine prevention with overall stable symptoms, prn Maxalt for rescue. 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 He has been  evaluated by our Movement Disorders specialist who did an extensive review of his records and brain imaging, recommending Amantadine to start with. Side effects discussed with them. He expressed significant frustration with ongoing care,  he expressed fatigue with seeing so many doctors. He is not interested in further follow-up in our office if the Amantadine is not helpful. We discussed that since migraines are stable on his current regimen, he can continue further refills with his PCP to minimize doctor visits. Follow-up in 1 year or as needed per patient preference, call for any changes.    Follow Up Instructions:   -I discussed the assessment and treatment plan with the patient. The patient was provided an opportunity to ask questions and all were answered. The patient agreed with the plan and demonstrated an understanding of the instructions.   The patient was advised to call back or seek an in-person evaluation if the symptoms worsen or if the condition fails to improve as anticipated.    Cameron Sprang, MD  CC: Dr. Kenton Kingfisher

## 2022-12-21 NOTE — Progress Notes (Unsigned)
History of Present Illness: . male former smoker with asthma/chronic bronchitis and allergic rhinitis, OSA PAF/ anticoagulation, aortic valve replacement, toxic encephalopathy, light sensitivity, chronic fatigue/fibromyalgia NPSG 03/24/03- AHI 88/ hr, desaturation to 75%, body weight 244 lbs PFT 06/01/17-WNL-FVC 4.46/96%, FEV1 3.71/106%, ratio 0.83, FEF 25-75% 4.64/167%, no response to dilator, TLC 107%, DLCO 101%  PFT 07/07/22- ------------------------------------------------------------------   08/18/22-  72 year old male former smoker (13.5 pkyrs) followed for Asthma/chronic Bronchitis and allergic Rhinitis,  OSA,  complicated by PAF/ Xarelto, Aortic Valve Replacement,/ IE abscess, Toxic Encephalopathy, Light sensitivity, Chronic Fatigue/fibromyalgia -Flutter valve, Symbicort 160, Astelin nasal, tessalon, singulair, Ventolin hfa,  CPAP   18/Adapt   AirSense 10 AutoSet    - replacement and change to auto5-15 ordered 5/16/233 Download compliance- 17%, AHI 8.3/ hr Body weight today-254 lbs Wife here- indicates his paroxysmal coughing distresses her. He tries to blow it off and indicates willingness to let it go. Tries clearly recognized triggers. Has been taking a lot of cold and allergy meds. Tessalon is some help. He is going to try accupuncture for cough and lack of energy. Admits some post-nasal drip and will try increasing zyrtec to twice daily. Codeine and tramadol make him "weird and crazy". Not using CPAP regularly recently- blames cough.Says hard cpough may skip days.  12/23/22-  72 year old male former smoker (13.5 pkyrs) followed for Asthma/chronic Bronchitis and allergic Rhinitis,  OSA,  complicated by PAF/ Xarelto, Aortic Valve Replacement,/ IE abscess, Toxic Encephalopathy, Light sensitivity, Chronic Fatigue/fibromyalgia, CVA, Post-stroke chorea,  -Flutter valve, Symbicort 160, Astelin nasal, tessalon, singulair, Ventolin hfa,  CPAP   16/Adapt   AirSense 10 AutoSet    - replacement and  change to auto5-15 ordered 03/04/22 Download compliance- 100%, AHI 3.8/ hr Body weight today-234 lbs Tooth pain led to dental imaging which identified sinusitis.  After 2 rounds of Augmentin, per wife, update CT showed resolution with clear sinuses and small retention cyst. Dry upper airway pattern cough for which he had seen Dr. Melvyn Novas.  Marolyn Hammock Rancher candies began to choke him so he has stopped that.  Sips of water helps.  He has Tessalon. Neurologist have determined he had a small CVA at some point.  They think his constant leg movements represent "post-stroke chorea".  I do not know if some of his cough might be part of a tic related to that.  ROS-see HPI     + = positive Constitutional:   No-   weight loss, night sweats, fevers, chills, fatigue, lassitude. HEENT:   No-  headaches, difficulty swallowing, tooth/dental problems,  sore throat,       No-  sneezing, itching, ear ache,                    No-nasal congestion, + post nasal drip,  CV:  +chest pain, orthopnea, PND, swelling in lower extremities, anasarca,  dizziness, palpitations Resp: No- coughing up of blood.               change in color of mucus.  No- wheezing.   Skin: Clear GI:   heartburn, indigestion, abdominal pain, nausea, vomiting,  GU:  MS:  No-   joint pain or swelling.   Neuro-     nothing unusual Psych:  No- change in mood or affect. + depression or anxiety.  No memory loss.  OBJ- Physical Exam Sunglasses, fidgeting/ restless General- Alert, Oriented, Affect-appropriate, Distress- none acute. +fidgeting, restless, sunglasses Skin- +eczematoid patches on lower calves, nonspecific, excoriated Lymphadenopathy- none Head- atraumatic. Stares aimlessly around  room like a blind person.             Eyes- Gross vision intact, PERRLA, conjunctivae and secretions clear,  + sunglasses            Ears- Hearing, canals-normal            Nose- Clear, no-Septal dev, mucus, polyps, erosion, perforation             Throat-  Mallampati II-III, mucosa , drainage- none, tonsils- atrophic Neck- flexible , trachea midline, no stridor , thyroid nl, carotid no bruit Chest - symmetrical excursion , unlabored           Heart/CV- RRR ,  murmur+2-3/6 S, no gallop  , no rub, nl s1 s2,                            - JVD- none , edema- none, stasis changes- none, varices- none           Lung- + clear/ unlabored, wheeze- none, cough+dry, dullness-none, rub- none           Chest wall-  Abd-  Br/ Gen/ Rectal- Not done, not indicated Extrem- cyanosis- none, clubbing, none, atrophy- none, strength- nl Neuro- constant movement of legs

## 2022-12-23 ENCOUNTER — Ambulatory Visit: Payer: Medicare Other | Admitting: Internal Medicine

## 2022-12-23 ENCOUNTER — Encounter: Payer: Self-pay | Admitting: Internal Medicine

## 2022-12-23 VITALS — BP 132/70 | HR 63 | Ht 70.0 in | Wt 234.4 lb

## 2022-12-23 DIAGNOSIS — I4891 Unspecified atrial fibrillation: Secondary | ICD-10-CM

## 2022-12-23 DIAGNOSIS — G4733 Obstructive sleep apnea (adult) (pediatric): Secondary | ICD-10-CM

## 2022-12-23 DIAGNOSIS — J45991 Cough variant asthma: Secondary | ICD-10-CM

## 2022-12-23 NOTE — Assessment & Plan Note (Signed)
Symptom control discussed.  If amantadine helps his limb movements, it will be interesting to see if it also helps his cough, speculating that his cough might be part of the same neurologic issue.

## 2022-12-23 NOTE — Assessment & Plan Note (Signed)
Benefits from CPAP with good compliance and control Plan-continue CPAP 16

## 2022-12-23 NOTE — Assessment & Plan Note (Signed)
Rhythm today is regular or nearly so.

## 2022-12-23 NOTE — Patient Instructions (Signed)
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

## 2022-12-30 ENCOUNTER — Telehealth: Payer: Self-pay

## 2022-12-30 MED ORDER — AIMOVIG 140 MG/ML ~~LOC~~ SOAJ: 1.0000 | SUBCUTANEOUS | 11 refills | Status: DC

## 2022-12-30 NOTE — Telephone Encounter (Signed)
Refill sent to pharmacy with PA attached

## 2022-12-31 ENCOUNTER — Encounter: Payer: Self-pay | Admitting: Cardiovascular Disease

## 2022-12-31 NOTE — Telephone Encounter (Signed)
Called patient's wife (DPR) with Dr. Kyla Balzarine advisement. Patient will come in for EKG on 01/19/23.

## 2023-01-11 DIAGNOSIS — G4733 Obstructive sleep apnea (adult) (pediatric): Secondary | ICD-10-CM | POA: Diagnosis not present

## 2023-01-12 DIAGNOSIS — J3489 Other specified disorders of nose and nasal sinuses: Secondary | ICD-10-CM | POA: Diagnosis not present

## 2023-01-12 DIAGNOSIS — R058 Other specified cough: Secondary | ICD-10-CM | POA: Diagnosis not present

## 2023-01-12 DIAGNOSIS — B9689 Other specified bacterial agents as the cause of diseases classified elsewhere: Secondary | ICD-10-CM | POA: Diagnosis not present

## 2023-01-12 DIAGNOSIS — J069 Acute upper respiratory infection, unspecified: Secondary | ICD-10-CM | POA: Diagnosis not present

## 2023-01-13 ENCOUNTER — Encounter: Payer: Self-pay | Admitting: Cardiovascular Disease

## 2023-01-19 ENCOUNTER — Ambulatory Visit: Payer: Medicare Other

## 2023-01-20 DIAGNOSIS — G4733 Obstructive sleep apnea (adult) (pediatric): Secondary | ICD-10-CM | POA: Diagnosis not present

## 2023-01-21 ENCOUNTER — Telehealth: Payer: Self-pay | Admitting: Cardiovascular Disease

## 2023-01-21 NOTE — Telephone Encounter (Signed)
Pt c/o medication issue:  1. Name of Medication: Amoxicillin   2. How are you currently taking this medication (dosage and times per day)?   3. Are you having a reaction (difficulty breathing--STAT)?   4. What is your medication issue? [name] is calling stating that patient started this medication for a sinus infection, but would like to know dosing instructions for other meds with this. Please advise.

## 2023-01-22 ENCOUNTER — Other Ambulatory Visit: Payer: Self-pay | Admitting: Neurology

## 2023-01-22 DIAGNOSIS — K080 Exfoliation of teeth due to systemic causes: Secondary | ICD-10-CM | POA: Diagnosis not present

## 2023-01-22 NOTE — Telephone Encounter (Signed)
Left message for patient's wife letting her know it's okay for patient to take his amoxicillin with his other medications.

## 2023-01-22 NOTE — Telephone Encounter (Signed)
Agree can take amoxicillin as prescribed, shouldn't need to alter other meds.

## 2023-01-27 DIAGNOSIS — N401 Enlarged prostate with lower urinary tract symptoms: Secondary | ICD-10-CM | POA: Diagnosis not present

## 2023-01-27 DIAGNOSIS — R350 Frequency of micturition: Secondary | ICD-10-CM | POA: Diagnosis not present

## 2023-01-27 DIAGNOSIS — R3912 Poor urinary stream: Secondary | ICD-10-CM | POA: Diagnosis not present

## 2023-01-27 DIAGNOSIS — R3915 Urgency of urination: Secondary | ICD-10-CM | POA: Diagnosis not present

## 2023-01-29 DIAGNOSIS — R351 Nocturia: Secondary | ICD-10-CM | POA: Diagnosis not present

## 2023-01-29 DIAGNOSIS — N401 Enlarged prostate with lower urinary tract symptoms: Secondary | ICD-10-CM | POA: Diagnosis not present

## 2023-02-04 ENCOUNTER — Telehealth: Payer: Self-pay | Admitting: Cardiovascular Disease

## 2023-02-04 ENCOUNTER — Other Ambulatory Visit: Payer: Self-pay | Admitting: Urology

## 2023-02-04 DIAGNOSIS — D225 Melanocytic nevi of trunk: Secondary | ICD-10-CM | POA: Diagnosis not present

## 2023-02-04 DIAGNOSIS — L308 Other specified dermatitis: Secondary | ICD-10-CM | POA: Diagnosis not present

## 2023-02-04 DIAGNOSIS — Z1283 Encounter for screening for malignant neoplasm of skin: Secondary | ICD-10-CM | POA: Diagnosis not present

## 2023-02-04 NOTE — Telephone Encounter (Signed)
   Pre-operative Risk Assessment    Patient Name: Grant [name]  DOB: 08-29-1951 MRN: [id number]      Request for Surgical Clearance    Procedure:   Aqua Ablation of the Prostate  Date of Surgery:  Clearance 03/27/23                                 Surgeon:  Dr. Jerilee Field  Surgeon's Group or Practice Name:  [facility] Phone number:  [phone] Fax number:  [phone]   Type of Clearance Requested:   - Medical  - Pharmacy:  Hold Rivaroxaban (Xarelto)     Type of Anesthesia:  General    Additional requests/questions:    Milinda Pointer   02/04/2023, 4:04 PM

## 2023-02-05 NOTE — Telephone Encounter (Signed)
Primary Cardiologist:Peter Eden Emms, MD   Preoperative team, please contact this patient and set up a phone call appointment after May 1 for further preoperative risk assessment. Please obtain consent and complete medication review. Thank you for your help.   I confirm that guidance regarding antiplatelet and oral anticoagulation therapy has been completed and, if necessary, noted below.  Levi Aland, NP-C  02/05/2023, 12:00 PM [address] Office [phone] Fax [phone]

## 2023-02-05 NOTE — Telephone Encounter (Signed)
DPR on file to s/w pt's wife Grant [name]. In my conversation with Grant [name], we discussed about the echo to be done 5/28 and Dr. Eden Emms 03/26/23 and surgery 03/27/23. It's is felt that we may need to see if we can get echo moved up sooner and appt with DR. Nishan moved up sooner. I did ask Grant [name], if we cannot move MD appt sooner, are they ok to see on of the APP for the pre op clearance. Grant [name] is in agreement with the plan at this point. I informed Grant [name] that I will d/w pre op APP tomorrow for their input and recommendations. Grant [name] and I agreed that I will call back tomorrow with plan.

## 2023-02-05 NOTE — Telephone Encounter (Signed)
Patient with diagnosis of afib on Xarelto for anticoagulation. Also has hx of bioprosthetic AVR.   Procedure: aqua ablation of the prostate Date of procedure: 03/27/23  CHA2DS2-VASc Score = 4  This indicates a 4.8% annual risk of stroke. The patient's score is based upon: CHF History: 1 HTN History: 1 Diabetes History: 0 Stroke History: 0 Vascular Disease History: 1 Age Score: 1 Gender Score: 0   CrCl 67mL/min using adj body weight Platelet count 162K  Per office protocol, patient can hold Xarelto for 2-3 days prior to procedure.    **This guidance is not considered finalized until pre-operative APP has relayed final recommendations.**

## 2023-02-06 NOTE — Telephone Encounter (Signed)
Per our echo scheduler she was able to move echo appt up to 03/05/23, she did offer 03/02/23, though per pt's wife unable to do 03/02/23. Dr. Fabio Bering RN, Pam is working on moving up appt with MD as well. Once we have updated MD appt I will send FYI note to surgeon's office to let them know of the plan of care at this time for the pt.

## 2023-02-06 NOTE — Telephone Encounter (Signed)
I s/w the pt's wife (DPR). [name] has been made aware of updated appts for both the echo and Dr. Eden Emms appt been moved up. I will update all parties involved.

## 2023-02-09 NOTE — Progress Notes (Signed)
Date:  02/17/2023   ID:  Grant [name], DOB 1951/05/31, MRN [id number]  PCP:  Johny Blamer, MD  Cardiologist: Dr. Eden Emms  EP: Dr. Ladona Ridgel  Chief Complaint: PAF/AVR   History of Present Illness:   72 y.o. history of PAF, HTN, OSA on CPAP with AVR PVT  for bicuspid valve 2018 Bentall  (25mm Edwards pericardial Magna Ease)  No CAD at cath done 06/12/17 prior to surgery   Had recurrent PAF 07/28/17 converted with iv cardizem and seen by EP Dr Ladona Ridgel who recommended amiodarone 200 mg daily and changed back to  xarelto for anticoagulation   History of  angioedema. 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 Tested preoperative positive for COVID 05/22/21 Surgery rescheduled for 06/12/21 He has had recurrent diverticulitis with stricture   TTE: 05/01/21 EF 50-55% chronically mildly elevated gradients 25 mm [facility] Ease valve mean 21 mmHg   He was admitted in January 2023  with a febrile illness and grew out strep mitis/oralis on blood cultures.  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  A gated cardiac CTA was also suspicious but felt that circumferential thickening could be related to pledget material from the time of surgery.  D/c after prolonged hospital stay 11/05/21 Developed PAF/flutter around the time of PIC line change CXR showed PIC line at cavoatrial junction Rx xarelto and lopressor for rate control He was cardioverted in the EP lab  by Dr Lalla Brothers on 11/28/21   Called office 02/28/22 postural paleness and dizzy? Low pulse BP ok oxygen 70% range per wife Improved wearing his CPAP Rx with doxycycline from time/time for bronchitis   06/11/22 had hernia surgery with Dr Coralee Pesa held with no issues  Seen in ED 09/11/22 with blurred vision and in office today has had tardive dyskinesia which is quite new and prominent today. He has lip smacking and constant UE/LE movements Med list does not reveal and obvious culprit CT in ER only with old lacunar infarct no acute changes  Seen by neuro and MRI no acute findings Huntington Dx panel negative Tried on Ingrezza but did not tolerate ? 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 Tried on Amantadine   Need some sort of aqua ablation of his prostate Knows to hold eliquis 48 hours before Not taking Amantadine Concern for side effects told him he should and the benefits would outweigh risks     Past Medical History:  Diagnosis Date   Allergic rhinitis    Anxiety    Arthritis    Asthma    Bladder tumor    Chronic fatigue    COVID-19 05/22/2021   Depression    06/30/17 Pt denies being depressed, reports Effexor is taken for Chronic Fatigue    Dyspnea    Enlarged prostate    Fibromyalgia    GERD (gastroesophageal reflux disease)    Headache    History of chronic bronchitis  History of migraine    History of toxic encephalopathy    Hypothyroidism    OSA on CPAP    CPAP 14   PAF (paroxysmal atrial fibrillation) (HCC) CARDIOLOGIST -- DR Eden Emms   DX OCT 2013   Pneumonia    S/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 Donata Clay   BIOPSY  02/06/2021   Procedure: BIOPSY;  Surgeon: Vida Rigger, MD;  Location: [facility] ENDOSCOPY;  Service: Endoscopy;;   BUBBLE STUDY  11/01/2021   Procedure: BUBBLE STUDY;  Surgeon: Sande Rives, MD;  Location: [facility] ENDOSCOPY;  Service: Cardiovascular;;   CARDIOVERSION N/A 11/28/2021   Procedure: CARDIOVERSION (CATH LAB);  Surgeon: Lanier Prude, MD;  Location: [facility] INVASIVE CV LAB;  Service: Cardiovascular;  Laterality: N/A;   COLONOSCOPY     COLONOSCOPY WITH PROPOFOL N/A 02/06/2021   Procedure: COLONOSCOPY WITH PROPOFOL;  Surgeon: Vida Rigger, MD;  Location: [facility] ENDOSCOPY;  Service: Endoscopy;  Laterality: N/A;   CORONARY ANGIOPLASTY  06/12/2017   CYSTOSCOPY W/ RETROGRADES Bilateral 06/15/2013   Procedure: CYSTOSCOPY WITH BILATERAL RETROGRADE PYELOGRAM  BLADDER BIOPSY, PROSTATIC URETHRAL BIOPSY, ;  Surgeon: Milford Cage, MD;  Location: [facility];  Service: Urology;  Laterality: Bilateral;   ESOPHAGOGASTRODUODENOSCOPY (EGD) WITH PROPOFOL N/A 03/11/2018   Procedure: ESOPHAGOGASTRODUODENOSCOPY (EGD) WITH PROPOFOL;  Surgeon: Vida Rigger, MD;  Location: [facility] ENDOSCOPY;  Service: Endoscopy;  Laterality: N/A;   EXCISION OF MESH N/A 06/11/2022   Procedure: REMOVAL OF OLD MESH;  Surgeon: Karie Soda, MD;  Location: [facility] ORS;  Service: General;  Laterality: N/A;   LAPAROSCOPIC CHOLECYSTECTOMY  01/14/2001   PROCTOSCOPY N/A 06/12/2021   Procedure: RIGID PROCTOSCOPY;  Surgeon: Karie Soda, MD;  Location: [facility] ORS;  Service: General;  Laterality: N/A;   RIGHT/LEFT HEART CATH AND CORONARY ANGIOGRAPHY N/A 06/12/2017   Procedure: RIGHT/LEFT HEART CATH AND CORONARY ANGIOGRAPHY;  Surgeon: Laurey Morale, MD;  Location: [facility] INVASIVE CV LAB;  Service: Cardiovascular;  Laterality: N/A;   TEE WITHOUT CARDIOVERSION N/A 07/02/2017   Procedure: TRANSESOPHAGEAL ECHOCARDIOGRAM (TEE);  Surgeon: Donata Clay, Theron Arista, MD;   Location: [facility] OR;  Service: Open Heart Surgery;  Laterality: N/A;   TEE WITHOUT CARDIOVERSION N/A 11/01/2021   Procedure: TRANSESOPHAGEAL ECHOCARDIOGRAM (TEE);  Surgeon: Sande Rives, MD;  Location: [facility] ENDOSCOPY;  Service: Cardiovascular;  Laterality: N/A;   UMBILICAL HERNIA REPAIR  01/14/2008   VENTRAL HERNIA REPAIR N/A 06/11/2022   Procedure: LAPAROSCOPIC VENTRAL HERNIA WITH LYSIS OF ADHESIONS;  Surgeon: Karie Soda, MD;  Location: [facility] ORS;  Service: General;  Laterality: N/A;  WITH MESH   WISDOM TOOTH EXTRACTION      Current Medications: Prior to Admission medications   Medication Sig Start Date End Date Taking? Authorizing Provider  acetaminophen (TYLENOL) 500 MG tablet Take 500 mg by mouth every 6 (six) hours as needed for moderate pain.    [provider]  albuterol (VENTOLIN HFA) 108 (90 Base) MCG/ACT inhaler INHALE 2 PUFFS INTO LUNGS EVERY 6 HOURS AS NEEDED FOR WHEEZING OR SHORTNESS OF BREATH 01/19/17  Jetty Duhamel D, MD  amiodarone (PACERONE) 200 MG tablet Take 1 tablet (200 mg total) by mouth daily. 07/29/17   Manson Passey, PA  aspirin 81 MG EC tablet Take 1 tablet (81 mg total) by mouth daily. 07/13/17   Gold, Glenice Laine, PA-C  azelastine (ASTELIN) 137 MCG/SPRAY nasal spray Place 1-2 sprays into the nose 2 (two) times daily as needed for rhinitis or allergies.     [provider]  benzonatate (TESSALON) 200 MG capsule Take 1 capsule (200 mg total) by mouth every 6 (six) hours as needed for cough. 11/19/16   Jetty Duhamel D, MD  budesonide-formoterol (SYMBICORT) 160-4.5 MCG/ACT inhaler INHALE 2 PUFF INTO THE LUNGS 2 TIMES A DAY. 01/19/17   Jetty Duhamel D, MD  Cholecalciferol (VITAMIN D-3) 5000 UNITS TABS Take 5,000 Units by mouth daily with breakfast.     [provider]  COLCRYS 0.6 MG tablet Take 0.6 mg by mouth daily. With breakfast 01/29/12   [provider]  Cyanocobalamin (VITAMIN B12 SL) Place 1,200 mcg under the tongue daily. 1  dropper    [provider]  dextromethorphan (DELSYM) 30 MG/5ML liquid Take 30 mg by mouth at bedtime as needed for cough.     [provider]  DHEA 50 MG TABS Take 50 mg by mouth daily with breakfast.     [provider]  fexofenadine (ALLEGRA) 180 MG tablet Take 180 mg by mouth daily with breakfast.     [provider]  finasteride (PROSCAR) 5 MG tablet Take 5 mg by mouth at bedtime.  01/14/14   [provider]  Hypromellose Cloyd Stagers ALLERGY NASAL SPRAY NA) Place 1 spray into both nostrils daily as needed (for allergies).     [provider]  levothyroxine (SYNTHROID, LEVOTHROID) 112 MCG tablet Take 112 mcg by mouth daily before breakfast.     [provider]  liothyronine (CYTOMEL) 25 MCG tablet Take 25 mcg by mouth every morning.     [provider]  magic mouthwash w/lidocaine SOLN Take 10 mLs by mouth 4 (four) times daily as needed for mouth pain. 07/16/17   Waymon Budge, MD  Menthol, Topical Analgesic, (BLUE-EMU MAXIMUM STRENGTH EX) Apply 1 application topically 4 (four) times daily as needed (for arthritis pain.).    [provider]  metoprolol tartrate (LOPRESSOR) 50 MG tablet Take 1 tablet (50 mg total) by mouth 2 (two) times daily. 07/29/17   Bhagat, Bhavinkumar, PA  milk thistle 175 MG tablet Take 175 mg by mouth daily.    [provider]  Multiple Vitamins-Minerals (ONE-A-DAY MENS HEALTH FORMULA PO) Take 1 tablet by mouth daily with breakfast.     [provider]  omeprazole (PRILOSEC) 40 MG capsule Take 1 capsule (40 mg total) by mouth 2 (two) times daily. 10/31/16   Wendall Stade, MD  perphenazine (TRILAFON) 2 MG tablet Take 2 mg by mouth 2 (two) times daily as needed (for migraines).     [provider]  Probiotic Product (ALIGN) 4 MG CAPS Take 4 mg by mouth daily with breakfast.     [provider]  tamsulosin (FLOMAX) 0.4 MG CAPS capsule Take 0.4 mg by mouth daily  after supper.    [provider]  testosterone cypionate (DEPOTESTOTERONE CYPIONATE) 100 MG/ML injection Inject 200 mg into the muscle every Thursday. For IM use only     [provider]  tetrahydrozoline-zinc (VISINE-AC) 0.05-0.25 % ophthalmic solution Place 1 drop into both eyes as needed (dry  eyes).    [provider]  venlafaxine (EFFEXOR-XR) 150 MG 24 hr capsule Take 150 mg by mouth every morning.     [provider]  warfarin (COUMADIN) 2.5 MG tablet Take 1 tablet (2.5 mg total) by mouth daily at 6 PM. Take 7.5mg  (3 tablet tonight) and 5mg  tomorrow and INR check Friday Patient taking differently: Take 2.5 mg by mouth daily at 6 PM. Take 7.5mg  (3 tablet tonight) and 5mg  tomorrow and INR check Friday and as directed 07/29/17   Manson Passey, PA    Allergies:   Ingrezza [valbenazine tosylate], Perphenazine, Amiodarone, Codeine, Prednisone, and Tramadol   Social History   Socioeconomic History   Marital status: Married    Spouse name: Not on file   Number of children: Not on file   Years of education: Not on file   Highest education level: Not on file  Occupational History   Occupation: disabled    Comment: owner of buisness  Tobacco Use   Smoking status: Former    Packs/day: 0.50    Years: 27.00    Additional pack years: 0.00    Total pack years: 13.50    Types: Cigarettes    Start date: 61    Quit date: 10/21/1979    Years since quitting: 43.3   Smokeless tobacco: Never  Vaping Use   Vaping Use: Never used  Substance and Sexual Activity   Alcohol use: Yes    Alcohol/week: 4.0 standard drinks of alcohol    Types: 4 Cans of beer per week    Comment: drinks "as much as I could" - 2-3 beers up to 8-10   Drug use: Never   Sexual activity: Not Currently  Other Topics Concern   Not on file  Social History Narrative   Right handed   One story home   Drinks no caffeine   Lives with wife    Social Determinants of Health    Financial Resource Strain: Unknown (11/22/2021)   Overall Financial Resource Strain (CARDIA)    Difficulty of Paying Living Expenses: Patient declined  Food Insecurity: No Food Insecurity (11/22/2021)   Hunger Vital Sign    Worried About Running Out of Food in the Last Year: Never true    Ran Out of Food in the Last Year: Never true  Transportation Needs: No Transportation Needs (11/22/2021)   PRAPARE - Administrator, Civil Service (Medical): No    Lack of Transportation (Non-Medical): No  Physical Activity: Unknown (11/22/2021)   Exercise Vital Sign    Days of Exercise per Week: 4 days    Minutes of Exercise per Session: Not on file  Stress: Unknown (11/22/2021)   [employer] of Occupational Health - Occupational Stress Questionnaire    Feeling of Stress : Patient declined  Social Connections: Unknown (11/22/2021)   Social Connection and Isolation Panel [NHANES]    Frequency of Communication with Friends and Family: More than three times a week    Frequency of Social Gatherings with Friends and Family: Once a week    Attends Religious Services: Patient declined    Database administrator or Organizations: No    Attends Engineer, structural: Not on file    Marital Status: Married     Family History:  The patient's family history includes Aortic aneurysm (age of onset: 70) in his mother; Heart disease in an other family member; Other in his father.   ROS:   Please see the history of present  illness.    ROS All other systems reviewed and are negative.   PHYSICAL EXAM:   VS:  BP 138/82   Pulse 61   Ht 5\' 10"  (1.778 m)   Wt 237 lb (107.5 kg)   SpO2 96%   BMI 34.01 kg/m     Affect appropriate Obese white male  HEENT: normal Neck supple with no adenopathy JVP normal no bruits no thyromegaly Lungs clear with no wheezing and good diaphragmatic motion Heart:  S1/S2 SEM no AR  murmur, no rub, gallop or click PMI normal post sternotomy  Abdomen: post  umbilical hernia repair  Distal pulses intact with no bruits No edema Neuro non-focal Skin warm and dry No muscular weakness PIC line LUE with no edema or signs of phlebitis     Wt Readings from Last 3 Encounters:  02/17/23 237 lb (107.5 kg)  12/23/22 234 lb 6.4 oz (106.3 kg)  12/19/22 228 lb (103.4 kg)      Studies/Labs Reviewed:   EKG:   02/16/19 SR LAD nonspecific ST changes 02/17/2023 NSR rate 77 sinus arrhythmia stable 02/17/2023 SR rate 62 QRS 129 msec minimally prolonged no significant AV block   Recent Labs: 09/06/2022: ALT 28 11/02/2022: BUN 10; Creatinine, Ser 0.93; Hemoglobin 16.3; Magnesium 1.7; Platelets 162; Potassium 3.8; Sodium 136   Lipid Panel    Component Value Date/Time   CHOL 185 02/17/2019 0714   CHOL 184 05/05/2017 0931   TRIG 234 (H) 02/17/2019 0714   HDL 42 02/17/2019 0714   HDL 45 05/05/2017 0931   CHOLHDL 4.4 02/17/2019 0714   VLDL 47 (H) 02/17/2019 0714   LDLCALC 96 02/17/2019 0714   LDLCALC 94 05/05/2017 0931    Additional studies/ records that were reviewed today include:  As above   ASSESSMENT & PLAN:    1. PAF - on xarelto continue beta blocker off amiodarone now Post [facility] by Dr Lalla Brothers 11/28/21 Maintaining NSR   2. HTN - Well controlled.  Continue current medications and low sodium Dash type diet.     3.  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 Donata Clay 07/02/17. Post op echo with stable gradients no AR  -TEE 11/01/21 EF 60-65% 25 mm Edwards magna ease no AR And normal gradients mean 17 peak 29 mmhg Cardiac CTA with no abscess or SBE   4. OSA -Compliant with CPAP.  5. 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 F/U allergist cannot take oral steroids due to MS changes   6. Global Amnesia:  F/u neurology MRI/EEG ok ? Movement disorder improving etiology not clear   7. Bacteremia:  post 6 weeks antibiotics via PIC line no evidence of SBE on TEE/cardiac CTA f/u with ID     8. Pulmonary :  seeing Dr Maple Hudson has some bronchitis and ? Seasonal allergies leading to cough and congestion in throat Episode of hypoxia/bradycardia likely from mucous impaction as improved with CPAP and doxycycline   9. Tardive Dyskinesia:  acute onset  around November on my visit ? 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 Preoperative:  for aqua ablation of prostate can hold xarelto 2 days before EF is normal, AVR functioning well has been in NSR ok to proceed will likely get antibiotics for procedure per urology to include coverage of enterococcus     F/U in 6 months      Charlton Haws

## 2023-02-11 DIAGNOSIS — G4733 Obstructive sleep apnea (adult) (pediatric): Secondary | ICD-10-CM | POA: Diagnosis not present

## 2023-02-17 ENCOUNTER — Encounter: Payer: Self-pay | Admitting: Cardiovascular Disease

## 2023-02-17 ENCOUNTER — Encounter: Payer: Self-pay | Admitting: Neurology

## 2023-02-17 ENCOUNTER — Ambulatory Visit: Payer: Medicare Other | Attending: Cardiovascular Disease | Admitting: Cardiovascular Disease

## 2023-02-17 VITALS — BP 138/82 | HR 61 | Ht 70.0 in | Wt 237.0 lb

## 2023-02-17 DIAGNOSIS — Z952 Presence of prosthetic heart valve: Secondary | ICD-10-CM

## 2023-02-17 DIAGNOSIS — I10 Essential (primary) hypertension: Secondary | ICD-10-CM | POA: Diagnosis not present

## 2023-02-17 DIAGNOSIS — I483 Typical atrial flutter: Secondary | ICD-10-CM

## 2023-02-17 DIAGNOSIS — Z7901 Long term (current) use of anticoagulants: Secondary | ICD-10-CM | POA: Diagnosis not present

## 2023-02-17 DIAGNOSIS — I480 Paroxysmal atrial fibrillation: Secondary | ICD-10-CM | POA: Diagnosis not present

## 2023-02-17 DIAGNOSIS — G2401 Drug induced subacute dyskinesia: Secondary | ICD-10-CM

## 2023-02-17 NOTE — Patient Instructions (Signed)
Medication Instructions:  Your physician recommends that you continue on your current medications as directed. Please refer to the Current Medication list given to you today.  *If you need a refill on your cardiac medications before your next appointment, please call your pharmacy*  Lab Work: If you have labs (blood work) drawn today and your tests are completely normal, you will receive your results only by: MyChart Message (if you have MyChart) OR A paper copy in the mail If you have any lab test that is abnormal or we need to change your treatment, we will call you to review the results.  Testing/Procedures: None ordered today.  Follow-Up: At [facility], you and your health needs are our priority.  As part of our continuing mission to provide you with exceptional heart care, we have created designated Provider Care Teams.  These Care Teams include your primary Cardiologist (physician) and Advanced Practice Providers (APPs -  Physician Assistants and Nurse Practitioners) who all work together to provide you with the care you need, when you need it.  We recommend signing up for the patient portal called "MyChart".  Sign up information is provided on this After Visit Summary.  MyChart is used to connect with patients for Virtual Visits (Telemedicine).  Patients are able to view lab/test results, encounter notes, upcoming appointments, etc.  Non-urgent messages can be sent to your provider as well.   To learn more about what you can do with MyChart, go to [email or web address].    Your next appointment:   6 month(s)  Provider:   Peter Nishan, MD     

## 2023-02-19 DIAGNOSIS — G4733 Obstructive sleep apnea (adult) (pediatric): Secondary | ICD-10-CM | POA: Diagnosis not present

## 2023-02-19 NOTE — Patient Instructions (Addendum)
DUE TO COVID-19 ONLY TWO VISITORS  (aged 70 and older)  ARE ALLOWED TO COME WITH YOU AND STAY IN THE WAITING ROOM ONLY DURING PRE OP AND PROCEDURE.   **NO VISITORS ARE ALLOWED IN THE SHORT STAY AREA OR RECOVERY ROOM!!**  IF YOU WILL BE ADMITTED INTO THE HOSPITAL YOU ARE ALLOWED ONLY FOUR SUPPORT PEOPLE DURING VISITATION HOURS ONLY (7 AM -8PM)   The support person(s) must pass our screening, gel in and out, and wear a mask at all times, including in the patient's room. Patients must also wear a mask when staff or their support person are in the room. Visitors GUEST BADGE MUST BE WORN VISIBLY  One adult visitor may remain with you overnight and MUST be in the room by 8 P.M.     Your procedure is scheduled on: 03/27/23   Report to [facility] Main Entrance    Report to admitting at   5:15 AM   Call this number if you have problems the morning of surgery [phone]   Do not eat food or drink:After Midnight.         If you have questions, please contact your surgeon's office.   FOLLOW BOWEL PREP AND ANY ADDITIONAL PRE OP INSTRUCTIONS YOU RECEIVED FROM YOUR SURGEON'S OFFICE!!!     Oral Hygiene is also important to reduce your risk of infection.                                    Remember - BRUSH YOUR TEETH THE MORNING OF SURGERY WITH YOUR REGULAR TOOTHPASTE  DENTURES WILL BE REMOVED PRIOR TO SURGERY PLEASE DO NOT APPLY "Poly grip" OR ADHESIVES!!!   Do NOT smoke after Midnight   Take these medicines the morning of surgery with A SIP OF WATER: Use your inhalers and bring them with you                                                                                                                         Xanax if needed                                                                                                                         Venlafaxine  Metoprolol                                                                                                                           Colchicine                                                                                                                          Tamsulosin                                                                                                                          Levothyroxine                                                                                                                          Omeprazole   Before surgery.Stop taking __xarelto 3 days (72hours) prior to day of surgery. Last dose will be 6/2 PM .  as directed by your Surgeon/Cardiologist.  Contact your Surgeon/Cardiologist for instructions on Anticoagulant Therapy prior to surgery.  Bring CPAP mask and tubing day of surgery.                              You may not have any metal on your body including  jewelry, and body piercing             Do not wear lotions, powders, cologne,  or deodorant              Men may shave face and neck.   Do not bring valuables to the hospital. [facility] IS NOT             RESPONSIBLE   FOR VALUABLES.   Contacts, glasses, or bridgework may not be worn into surgery.   Bring small overnight bag day of surgery.   DO NOT BRING YOUR HOME MEDICATIONS TO THE HOSPITAL. PHARMACY WILL DISPENSE MEDICATIONS LISTED ON YOUR MEDICATION LIST TO YOU DURING YOUR ADMISSION IN THE HOSPITAL!     Special Instructions: Bring a copy of your healthcare power of attorney and living will documents  the day of surgery if you haven't scanned them before.              Please read over the following fact sheets you were given: IF YOU HAVE QUESTIONS ABOUT YOUR PRE-OP INSTRUCTIONS PLEASE CALL [phone]    [facility] - Preparing for Surgery Before surgery, you can play an important role.  Because skin is not sterile, your skin needs to be as free of germs as possible.  You can reduce the number  of germs on your skin by washing with CHG (chlorahexidine gluconate) soap before surgery.  CHG is an antiseptic cleaner which kills germs and bonds with the skin to continue killing germs even after washing. Please DO NOT use if you have an allergy to CHG or antibacterial soaps.  If your skin becomes reddened/irritated stop using the CHG and inform your nurse when you arrive at Short Stay. .  You may shave your face/neck.  Please follow these instructions carefully:  1.  Shower with CHG Soap the night before surgery and the  morning of Surgery.  2.  If you choose to wash your hair, wash your hair first as usual with your  normal  shampoo.  3.  After you shampoo, rinse your hair and body thoroughly to remove the  shampoo.                            4.  Use CHG as you would any other liquid soap.  You can apply chg directly  to the skin and wash                       Gently with a scrungie or clean washcloth.  5.  Apply the CHG Soap to your body ONLY FROM THE NECK DOWN.   Do not use on face/ open                           Wound or open sores. Avoid contact with eyes, ears mouth and genitals (private parts).                       Wash face,  Genitals (private parts) with your normal soap.             6.  Wash thoroughly, paying special attention to the area where your surgery  will be performed.  7.  Thoroughly rinse your body with warm water from the neck down.  8.  DO NOT shower/wash with your normal soap after using and rinsing off  the CHG Soap.             9.  Pat yourself dry with a clean towel.  10.  Wear clean pajamas.            11.  Place clean sheets on your bed the night of your first shower and do not  sleep with pets. Day of Surgery : Do not apply any lotions/deodorants the morning of surgery.  Please wear clean clothes to the hospital/surgery center.  FAILURE TO FOLLOW THESE INSTRUCTIONS MAY RESULT IN THE CANCELLATION OF YOUR SURGERY  PATIENT  SIGNATURE_________________________________   ________________________________________________________________________

## 2023-02-20 DIAGNOSIS — I10 Essential (primary) hypertension: Secondary | ICD-10-CM | POA: Diagnosis not present

## 2023-02-20 DIAGNOSIS — F3341 Major depressive disorder, recurrent, in partial remission: Secondary | ICD-10-CM | POA: Diagnosis not present

## 2023-02-20 DIAGNOSIS — I480 Paroxysmal atrial fibrillation: Secondary | ICD-10-CM | POA: Diagnosis not present

## 2023-02-20 DIAGNOSIS — D6869 Other thrombophilia: Secondary | ICD-10-CM | POA: Diagnosis not present

## 2023-02-24 ENCOUNTER — Encounter: Payer: Self-pay | Admitting: Cardiovascular Disease

## 2023-02-25 DIAGNOSIS — M5136 Other intervertebral disc degeneration, lumbar region: Secondary | ICD-10-CM | POA: Diagnosis not present

## 2023-02-25 DIAGNOSIS — M9905 Segmental and somatic dysfunction of pelvic region: Secondary | ICD-10-CM | POA: Diagnosis not present

## 2023-02-25 DIAGNOSIS — M9904 Segmental and somatic dysfunction of sacral region: Secondary | ICD-10-CM | POA: Diagnosis not present

## 2023-02-25 DIAGNOSIS — M9903 Segmental and somatic dysfunction of lumbar region: Secondary | ICD-10-CM | POA: Diagnosis not present

## 2023-03-02 DIAGNOSIS — R350 Frequency of micturition: Secondary | ICD-10-CM | POA: Diagnosis not present

## 2023-03-02 DIAGNOSIS — R351 Nocturia: Secondary | ICD-10-CM | POA: Diagnosis not present

## 2023-03-03 ENCOUNTER — Other Ambulatory Visit: Payer: Self-pay | Admitting: Cardiothoracic Surgery

## 2023-03-03 DIAGNOSIS — Z952 Presence of prosthetic heart valve: Secondary | ICD-10-CM

## 2023-03-04 DIAGNOSIS — M9904 Segmental and somatic dysfunction of sacral region: Secondary | ICD-10-CM | POA: Diagnosis not present

## 2023-03-04 DIAGNOSIS — M5136 Other intervertebral disc degeneration, lumbar region: Secondary | ICD-10-CM | POA: Diagnosis not present

## 2023-03-04 DIAGNOSIS — M9903 Segmental and somatic dysfunction of lumbar region: Secondary | ICD-10-CM | POA: Diagnosis not present

## 2023-03-04 DIAGNOSIS — M9905 Segmental and somatic dysfunction of pelvic region: Secondary | ICD-10-CM | POA: Diagnosis not present

## 2023-03-05 ENCOUNTER — Ambulatory Visit (HOSPITAL_COMMUNITY)
Admission: RE | Admit: 2023-03-05 | Discharge: 2023-03-05 | Disposition: A | Discharge status: Home / Self Care | Payer: Medicare Other | Source: Ambulatory Visit | Attending: Nurse Practitioner | Admitting: Nurse Practitioner

## 2023-03-05 DIAGNOSIS — I10 Essential (primary) hypertension: Secondary | ICD-10-CM | POA: Diagnosis not present

## 2023-03-05 DIAGNOSIS — I480 Paroxysmal atrial fibrillation: Secondary | ICD-10-CM | POA: Diagnosis not present

## 2023-03-05 DIAGNOSIS — Z952 Presence of prosthetic heart valve: Secondary | ICD-10-CM

## 2023-03-05 DIAGNOSIS — I4891 Unspecified atrial fibrillation: Secondary | ICD-10-CM | POA: Insufficient documentation

## 2023-03-05 DIAGNOSIS — I517 Cardiomegaly: Secondary | ICD-10-CM | POA: Insufficient documentation

## 2023-03-05 DIAGNOSIS — R0600 Dyspnea, unspecified: Secondary | ICD-10-CM | POA: Diagnosis not present

## 2023-03-05 DIAGNOSIS — Z9889 Other specified postprocedural states: Secondary | ICD-10-CM | POA: Diagnosis not present

## 2023-03-05 DIAGNOSIS — K219 Gastro-esophageal reflux disease without esophagitis: Secondary | ICD-10-CM | POA: Insufficient documentation

## 2023-03-05 LAB — ECHOCARDIOGRAM COMPLETE
AR max vel: 1.84 cm2
AV Area VTI: 2.04 cm2
AV Area mean vel: 1.79 cm2
AV Mean grad: 26 mmHg
AV Peak grad: 45 mmHg
Ao pk vel: 3.35 m/s
Area-P 1/2: 4.21 cm2
Calc EF: 52.1 %
S' Lateral: 3.8 cm
Single Plane A2C EF: 50.9 %
Single Plane A4C EF: 52 %

## 2023-03-05 NOTE — Progress Notes (Signed)
Echocardiogram 2D Echocardiogram has been performed.  Grant [name] 03/05/2023, 12:12 PM

## 2023-03-09 ENCOUNTER — Ambulatory Visit: Payer: Medicare Other | Admitting: Cardiothoracic Surgery

## 2023-03-10 DIAGNOSIS — M9904 Segmental and somatic dysfunction of sacral region: Secondary | ICD-10-CM | POA: Diagnosis not present

## 2023-03-10 DIAGNOSIS — M5136 Other intervertebral disc degeneration, lumbar region: Secondary | ICD-10-CM | POA: Diagnosis not present

## 2023-03-10 DIAGNOSIS — M9905 Segmental and somatic dysfunction of pelvic region: Secondary | ICD-10-CM | POA: Diagnosis not present

## 2023-03-10 DIAGNOSIS — M9903 Segmental and somatic dysfunction of lumbar region: Secondary | ICD-10-CM | POA: Diagnosis not present

## 2023-03-12 ENCOUNTER — Other Ambulatory Visit: Payer: Self-pay

## 2023-03-12 ENCOUNTER — Encounter (HOSPITAL_COMMUNITY): Payer: Self-pay

## 2023-03-12 ENCOUNTER — Encounter (HOSPITAL_COMMUNITY)
Admission: RE | Admit: 2023-03-12 | Discharge: 2023-03-12 | Disposition: A | Discharge status: Home / Self Care | Payer: Medicare Other | Source: Ambulatory Visit | Attending: Urology | Admitting: Urology

## 2023-03-12 DIAGNOSIS — Z01812 Encounter for preprocedural laboratory examination: Secondary | ICD-10-CM | POA: Diagnosis not present

## 2023-03-12 DIAGNOSIS — I10 Essential (primary) hypertension: Secondary | ICD-10-CM | POA: Insufficient documentation

## 2023-03-12 HISTORY — DX: Cerebral infarction, unspecified: I63.9

## 2023-03-12 LAB — CBC
HCT: 49.7 % (ref 39.0–52.0)
Hemoglobin: 16.6 g/dL (ref 13.0–17.0)
MCH: 32.2 pg (ref 26.0–34.0)
MCHC: 33.4 g/dL (ref 30.0–36.0)
MCV: 96.3 fL (ref 80.0–100.0)
Platelets: 149 10*3/uL — ABNORMAL LOW (ref 150–400)
RBC: 5.16 MIL/uL (ref 4.22–5.81)
RDW: 12.4 % (ref 11.5–15.5)
WBC: 7 10*3/uL (ref 4.0–10.5)
nRBC: 0 % (ref 0.0–0.2)

## 2023-03-12 LAB — BASIC METABOLIC PANEL
Anion gap: 6 (ref 5–15)
BUN: 10 mg/dL (ref 8–23)
CO2: 26 mmol/L (ref 22–32)
Calcium: 8.6 mg/dL — ABNORMAL LOW (ref 8.9–10.3)
Chloride: 105 mmol/L (ref 98–111)
Creatinine, Ser: 0.99 mg/dL (ref 0.61–1.24)
GFR, Estimated: >60 mL/min (ref >60)
Glucose, Bld: 87 mg/dL (ref 70–99)
Potassium: 3.9 mmol/L (ref 3.5–5.1)
Sodium: 137 mmol/L (ref 135–145)

## 2023-03-12 NOTE — Progress Notes (Signed)
Anesthesia note:   PCP - Dr Johny Blamer Cardiologist -Dr. Charlton Haws Other- Neurologist-Dr. Patrcia Dolly  Chest x-ray - 11/02/22-epic EKG - 11/03/22-epic Stress Test - no ECHO - 11/06/22 Cardiac Cath - 2018 CABG- Pacemaker/ICD device last checked:  Sleep Study - yes CPAP - yes  Pt is pre diabetic-no CBG at PAT visit- Fasting Blood Sugar at home- Checks Blood Sugar _____  Blood Thinner:Xarelto at night Blood Thinner Instructions:hold 72 hours Aspirin Instructions: Last Dose:6/2/pm  Anesthesia review: Yes  reason:card  Patient denies shortness of breath, fever, cough and chest pain at PAT appointment. [name] has POA. He has been Dx with "post stroke Chorea" he is light sensitive and very sensitive to loud sounds. He has constant muscle movements and some cognitive changes.  Patient verbalized understanding of instructions that were given to them at the PAT appointment. Patient was also instructed that they will need to review over the PAT instructions again at home before surgery.yes wife was with him.

## 2023-03-17 ENCOUNTER — Other Ambulatory Visit: Payer: Self-pay | Admitting: Urology

## 2023-03-17 ENCOUNTER — Other Ambulatory Visit (HOSPITAL_COMMUNITY): Payer: Medicare Other

## 2023-03-17 NOTE — Progress Notes (Signed)
Anesthesia Chart Review   Case: [id number] Date/Time: 03/27/23 0715   Procedure: TRANSURETHRAL WATERJET ABLATION OF PROSTATE   Anesthesia type: General   Pre-op diagnosis: BENIGN PROSTATE HYPERPLASIA   Location: [facility]   Surgeons: Jerilee Field, MD       DISCUSSION:71 y.o. former smoker with h/o HTN, OSA on CPAP, Stroke, s/p AVR 2018, PAF (on Xarelto), BPH scheduled for above procedure 03/27/2023 with Dr. Jerilee Field.   Pt last seen by cardiology 02/17/2023. Per OV note, "for aqua ablation of prostate can hold xarelto 2 days before EF is normal, AVR functioning well has been in NSR ok to proceed will likely get antibiotics for procedure per urology to include coverage of enterococcus"  Echo 03/05/2023 with normal LVEF, AV gradient increased from 40-45 mmHg.  VS: There were no vitals taken for this visit.  PROVIDERS: Johny Blamer, MD is PCP    LABS: Labs reviewed: Acceptable for surgery. (all labs ordered are listed, but only abnormal results are displayed)  Labs Reviewed - No data to display   IMAGES:   EKG:   CV: Echo 03/05/2023 1. Left ventricular ejection fraction, by estimation, is 55 to 60%. The  left ventricle has normal function. The left ventricle has no regional  wall motion abnormalities. There is mild left ventricular hypertrophy.  Left ventricular diastolic parameters  were normal.   2. Right ventricular systolic function is normal. The right ventricular  size is normal. There is moderately elevated pulmonary artery systolic  pressure.   3. Left atrial size was moderately dilated.   4. The mitral valve is abnormal. Trivial mitral valve regurgitation. No  evidence of mitral stenosis.   5. 25 mm Edwards bioprosthetic valve mild calcification of leaflets  gradients stable since prior TTE cone 02/28/22 . The aortic valve has been  repaired/replaced. Aortic valve regurgitation is not visualized. No aortic  stenosis is present. There  is a 25  mm Edwards bioprosthetic valve present in the aortic position. Procedure  Date: 10/28/21.   6. Aortic dilatation noted. There is mild dilatation of the aortic root,  measuring 38 mm.   7. The inferior vena cava is normal in size with greater than 50%  respiratory variability, suggesting right atrial pressure of 3 mmHg.  Past Medical History:  Diagnosis Date   Allergic rhinitis    Anxiety    Arthritis    Asthma    Bladder tumor    Chronic fatigue    COVID-19 05/22/2021   Depression    06/30/17 Pt denies being depressed, reports Effexor is taken for Chronic Fatigue    Dyspnea    Enlarged prostate    Fibromyalgia    GERD (gastroesophageal reflux disease)    History of chronic bronchitis    History of migraine    light and sound sensitive   History of toxic encephalopathy    Hypothyroidism    OSA on CPAP    CPAP 14   PAF (paroxysmal atrial fibrillation) (HCC) CARDIOLOGIST -- DR Eden Emms   DX OCT 2013   Pneumonia 2023   S/P AVR (aortic valve replacement) and aortoplasty    Stroke (HCC)    has post stroke chorea   Unspecified essential hypertension    Urethral tumor    PROSTATIC    Past Surgical History:  Procedure Laterality Date   BENTALL PROCEDURE N/A 07/02/2017   Procedure: BENTALL PROCEDURE;  Surgeon: Kerin Perna, MD;  Location: [facility] OR;  Service: Open Heart Surgery;  Laterality:  N/A;  WITH CIRC ARREST   BENTALL PROCEDURE  07/02/2017   w/ Aortic valve replacement    by Dr. Donata Clay   BIOPSY  02/06/2021   Procedure: BIOPSY;  Surgeon: Vida Rigger, MD;  Location: [facility];  Service: Endoscopy;;   BUBBLE STUDY  11/01/2021   Procedure: BUBBLE STUDY;  Surgeon: Sande Rives, MD;  Location: [facility] ENDOSCOPY;  Service: Cardiovascular;;   CARDIOVERSION N/A 11/28/2021   Procedure: CARDIOVERSION (CATH LAB);  Surgeon: Lanier Prude, MD;  Location: [facility] INVASIVE CV LAB;  Service: Cardiovascular;  Laterality: N/A;   COLONOSCOPY     COLONOSCOPY WITH PROPOFOL  N/A 02/06/2021   Procedure: COLONOSCOPY WITH PROPOFOL;  Surgeon: Vida Rigger, MD;  Location[facility];  Service: Endoscopy;  Laterality: N/A;   CORONARY ANGIOPLASTY  06/12/2017   CYSTOSCOPY W/ RETROGRADES Bilateral 06/15/2013   Procedure: CYSTOSCOPY WITH BILATERAL RETROGRADE PYELOGRAM  BLADDER BIOPSY, PROSTATIC URETHRAL BIOPSY, ;  Surgeon: Milford Cage, MD;  Location: [facility];  Service: Urology;  Laterality: Bilateral;   ESOPHAGOGASTRODUODENOSCOPY (EGD) WITH PROPOFOL N/A 03/11/2018   Procedure: ESOPHAGOGASTRODUODENOSCOPY (EGD) WITH PROPOFOL;  Surgeon: Vida Rigger, MD;  Location: [facility] ENDOSCOPY;  Service: Endoscopy;  Laterality: N/A;   EXCISION OF MESH N/A 06/11/2022   Procedure: REMOVAL OF OLD MESH;  Surgeon: Karie Soda, MD;  Location: [facility];  Service: General;  Laterality: N/A;   LAPAROSCOPIC CHOLECYSTECTOMY  01/14/2001   PROCTOSCOPY N/A 06/12/2021   Procedure: RIGID PROCTOSCOPY;  Surgeon: Karie Soda, MD;  Location: [facility];  Service: General;  Laterality: N/A;   RIGHT/LEFT HEART CATH AND CORONARY ANGIOGRAPHY N/A 06/12/2017   Procedure: RIGHT/LEFT HEART CATH AND CORONARY ANGIOGRAPHY;  Surgeon: Laurey Morale, MD;  Location: [facility] INVASIVE CV LAB;  Service: Cardiovascular;  Laterality: N/A;   TEE WITHOUT CARDIOVERSION N/A 07/02/2017   Procedure: TRANSESOPHAGEAL ECHOCARDIOGRAM (TEE);  Surgeon: Donata Clay, Theron Arista, MD;  Location: [facility] OR;  Service: Open Heart Surgery;  Laterality: N/A;   TEE WITHOUT CARDIOVERSION N/A 11/01/2021   Procedure: TRANSESOPHAGEAL ECHOCARDIOGRAM (TEE);  Surgeon: Sande Rives, MD;  Location: [facility] ENDOSCOPY;  Service: Cardiovascular;  Laterality: N/A;   UMBILICAL HERNIA REPAIR  01/14/2008   VENTRAL HERNIA REPAIR N/A 06/11/2022   Procedure: LAPAROSCOPIC VENTRAL HERNIA WITH LYSIS OF ADHESIONS;  Surgeon: Karie Soda, MD;  Location[facility];  Service: General;  Laterality: N/A;  WITH MESH   WISDOM TOOTH EXTRACTION      MEDICATIONS:   methylPREDNISolone acetate (DEPO-MEDROL) injection (RADIOLOGY ONLY) 120 mg    acetaminophen (TYLENOL) 500 MG tablet   albuterol (VENTOLIN HFA) 108 (90 Base) MCG/ACT inhaler   ALPRAZolam (XANAX) 0.25 MG tablet   aluminum hydroxide-magnesium carbonate (GAVISCON) 95-358 MG/15ML SUSP   amoxicillin-clavulanate (AUGMENTIN) 875-125 MG tablet   azelastine (ASTELIN) 137 MCG/SPRAY nasal spray   benzonatate (TESSALON) 200 MG capsule   budesonide-formoterol (SYMBICORT) 160-4.5 MCG/ACT inhaler   cetirizine (ZYRTEC) 10 MG tablet   Coenzyme Q10 (COQ10) 100 MG CAPS   colchicine (COLCRYS) 0.6 MG tablet   dextromethorphan (DELSYM) 30 MG/5ML liquid   diclofenac Sodium (VOLTAREN) 1 % GEL   diphenhydrAMINE (BENADRYL) 25 mg capsule   Erenumab-aooe (AIMOVIG) 140 MG/ML SOAJ   Ferrous Sulfate (IRON) 142 (45 Fe) MG TBCR   Fiber 500 MG CAPS   finasteride (PROSCAR) 5 MG tablet   Guaifenesin (MUCINEX MAXIMUM STRENGTH) 1200 MG TB12   ibuprofen (ADVIL) 200 MG tablet   ipratropium (ATROVENT) 0.06 % nasal spray   ipratropium-albuterol (DUONEB) 0.5-2.5 (3) MG/3ML SOLN   levothyroxine (  SYNTHROID, LEVOTHROID) 112 MCG tablet   liothyronine (CYTOMEL) 25 MCG tablet   losartan (COZAAR) 50 MG tablet   Menthol, Topical Analgesic, (BLUE-EMU MAXIMUM STRENGTH EX)   methocarbamol (ROBAXIN) 750 MG tablet   metoprolol tartrate (LOPRESSOR) 50 MG tablet   Multiple Vitamin (MULTIVITAMIN WITH MINERALS) TABS tablet   naproxen sodium (ALEVE) 220 MG tablet   olopatadine (PATANOL) 0.1 % ophthalmic solution   omeprazole (PRILOSEC) 40 MG capsule   OVER THE COUNTER MEDICATION   rivaroxaban (XARELTO) 20 MG TABS tablet   rizatriptan (MAXALT) 10 MG tablet   saccharomyces boulardii (FLORASTOR) 250 MG capsule   sodium chloride (OCEAN) 0.65 % SOLN nasal spray   tamsulosin (FLOMAX) 0.4 MG CAPS capsule   Testosterone Cypionate 200 MG/ML SOLN   venlafaxine XR (EFFEXOR-XR) 75 MG 24 hr capsule   zolpidem (AMBIEN) 10 MG tablet   PRESCRIPTION  MEDICATION     Jodell Cipro Ward, PA-C [facility] [phone]

## 2023-03-22 DIAGNOSIS — G4733 Obstructive sleep apnea (adult) (pediatric): Secondary | ICD-10-CM | POA: Diagnosis not present

## 2023-03-22 MED ORDER — TRANEXAMIC ACID 1000 MG/10ML IV SOLN: 1000.0000 mg | INTRAVENOUS | Status: AC

## 2023-03-23 ENCOUNTER — Ambulatory Visit: Payer: Medicare Other | Admitting: Cardiothoracic Surgery

## 2023-03-25 DIAGNOSIS — M5136 Other intervertebral disc degeneration, lumbar region: Secondary | ICD-10-CM | POA: Diagnosis not present

## 2023-03-25 DIAGNOSIS — M9905 Segmental and somatic dysfunction of pelvic region: Secondary | ICD-10-CM | POA: Diagnosis not present

## 2023-03-25 DIAGNOSIS — M9903 Segmental and somatic dysfunction of lumbar region: Secondary | ICD-10-CM | POA: Diagnosis not present

## 2023-03-25 DIAGNOSIS — M9904 Segmental and somatic dysfunction of sacral region: Secondary | ICD-10-CM | POA: Diagnosis not present

## 2023-03-26 ENCOUNTER — Ambulatory Visit: Payer: Medicare Other | Admitting: Cardiovascular Disease

## 2023-03-26 MED ORDER — CEFAZOLIN SODIUM-DEXTROSE 2-4 GM/100ML-% IV SOLN: 2.0000 g | INTRAVENOUS | Status: DC

## 2023-03-27 ENCOUNTER — Encounter (HOSPITAL_COMMUNITY)
Admission: RE | Disposition: A | Discharge status: Home / Self Care | Payer: Self-pay | Source: Ambulatory Visit | Attending: Urology

## 2023-03-27 ENCOUNTER — Other Ambulatory Visit: Payer: Self-pay

## 2023-03-27 ENCOUNTER — Ambulatory Visit (HOSPITAL_COMMUNITY): Payer: Medicare Other | Admitting: Physician Assistant

## 2023-03-27 ENCOUNTER — Encounter (HOSPITAL_COMMUNITY): Payer: Self-pay | Admitting: Urology

## 2023-03-27 ENCOUNTER — Observation Stay (HOSPITAL_COMMUNITY)
Admission: RE | Admit: 2023-03-27 | Discharge: 2023-03-29 | Disposition: A | Discharge status: Home / Self Care | Payer: Medicare Other | Source: Ambulatory Visit | Attending: Urology | Admitting: Urology

## 2023-03-27 ENCOUNTER — Ambulatory Visit (HOSPITAL_BASED_OUTPATIENT_CLINIC_OR_DEPARTMENT_OTHER): Payer: Medicare Other | Admitting: Physician Assistant

## 2023-03-27 DIAGNOSIS — R350 Frequency of micturition: Secondary | ICD-10-CM | POA: Diagnosis not present

## 2023-03-27 DIAGNOSIS — Z7901 Long term (current) use of anticoagulants: Secondary | ICD-10-CM | POA: Insufficient documentation

## 2023-03-27 DIAGNOSIS — Z79899 Other long term (current) drug therapy: Secondary | ICD-10-CM | POA: Diagnosis not present

## 2023-03-27 DIAGNOSIS — Z952 Presence of prosthetic heart valve: Secondary | ICD-10-CM | POA: Diagnosis not present

## 2023-03-27 DIAGNOSIS — Z87891 Personal history of nicotine dependence: Secondary | ICD-10-CM | POA: Diagnosis not present

## 2023-03-27 DIAGNOSIS — J45909 Unspecified asthma, uncomplicated: Secondary | ICD-10-CM | POA: Diagnosis not present

## 2023-03-27 DIAGNOSIS — I480 Paroxysmal atrial fibrillation: Secondary | ICD-10-CM | POA: Diagnosis not present

## 2023-03-27 DIAGNOSIS — I10 Essential (primary) hypertension: Secondary | ICD-10-CM | POA: Diagnosis not present

## 2023-03-27 DIAGNOSIS — F418 Other specified anxiety disorders: Secondary | ICD-10-CM | POA: Diagnosis not present

## 2023-03-27 DIAGNOSIS — Z9989 Dependence on other enabling machines and devices: Secondary | ICD-10-CM | POA: Diagnosis not present

## 2023-03-27 DIAGNOSIS — N138 Other obstructive and reflux uropathy: Secondary | ICD-10-CM | POA: Diagnosis present

## 2023-03-27 DIAGNOSIS — Z8673 Personal history of transient ischemic attack (TIA), and cerebral infarction without residual deficits: Secondary | ICD-10-CM | POA: Diagnosis not present

## 2023-03-27 DIAGNOSIS — G4733 Obstructive sleep apnea (adult) (pediatric): Secondary | ICD-10-CM | POA: Diagnosis not present

## 2023-03-27 DIAGNOSIS — R3912 Poor urinary stream: Secondary | ICD-10-CM

## 2023-03-27 DIAGNOSIS — N401 Enlarged prostate with lower urinary tract symptoms: Secondary | ICD-10-CM

## 2023-03-27 DIAGNOSIS — Z8616 Personal history of COVID-19: Secondary | ICD-10-CM | POA: Diagnosis not present

## 2023-03-27 DIAGNOSIS — E039 Hypothyroidism, unspecified: Secondary | ICD-10-CM | POA: Diagnosis not present

## 2023-03-27 DIAGNOSIS — N400 Enlarged prostate without lower urinary tract symptoms: Secondary | ICD-10-CM | POA: Diagnosis not present

## 2023-03-27 LAB — GLUCOSE, CAPILLARY: Glucose-Capillary: 108 mg/dL — ABNORMAL HIGH (ref 70–99)

## 2023-03-27 SURGERY — ABLATION, PROSTATE, TRANSURETHRAL, USING WATERJET
Anesthesia: General

## 2023-03-27 MED ORDER — IPRATROPIUM-ALBUTEROL 0.5-2.5 (3) MG/3ML IN SOLN: 3.0000 mL | Freq: Four times a day (QID) | RESPIRATORY_TRACT | Status: DC | PRN

## 2023-03-27 MED ORDER — STERILE WATER FOR IRRIGATION IR SOLN
Status: DC | PRN
  Administered 2023-03-27: 10 mL

## 2023-03-27 MED ORDER — PROPOFOL 10 MG/ML IV BOLUS
INTRAVENOUS | Status: AC
  Filled 2023-03-27: qty 20

## 2023-03-27 MED ORDER — METHOCARBAMOL 500 MG PO TABS
750.0000 mg | ORAL_TABLET | Freq: Four times a day (QID) | ORAL | Status: DC | PRN
  Administered 2023-03-27: 750 mg via ORAL
  Administered 2023-03-27: 500 mg via ORAL
  Administered 2023-03-28: 750 mg via ORAL
  Filled 2023-03-27 (×3): qty 2

## 2023-03-27 MED ORDER — OXYCODONE HCL 5 MG PO TABS
ORAL_TABLET | ORAL | Status: AC
  Filled 2023-03-27: qty 2

## 2023-03-27 MED ORDER — PANTOPRAZOLE SODIUM 40 MG PO TBEC
40.0000 mg | DELAYED_RELEASE_TABLET | Freq: Every day | ORAL | Status: DC
  Administered 2023-03-28 – 2023-03-29 (×2): 40 mg via ORAL
  Filled 2023-03-27 (×2): qty 1

## 2023-03-27 MED ORDER — ADULT MULTIVITAMIN W/MINERALS CH
1.0000 | ORAL_TABLET | Freq: Every day | ORAL | Status: DC
  Administered 2023-03-28 – 2023-03-29 (×2): 1 via ORAL
  Filled 2023-03-27 (×2): qty 1

## 2023-03-27 MED ORDER — KETOROLAC TROMETHAMINE 15 MG/ML IJ SOLN: 15.0000 mg | Freq: Once | INTRAMUSCULAR | Status: DC

## 2023-03-27 MED ORDER — SODIUM CHLORIDE 0.9 % IV SOLN
3.0000 g | Freq: Once | INTRAVENOUS | Status: AC
  Administered 2023-03-27: 3 g via INTRAVENOUS
  Filled 2023-03-27: qty 8

## 2023-03-27 MED ORDER — EPHEDRINE 5 MG/ML INJ
INTRAVENOUS | Status: AC
  Filled 2023-03-27: qty 10

## 2023-03-27 MED ORDER — CHLORHEXIDINE GLUCONATE 0.12 % MT SOLN
15.0000 mL | Freq: Once | OROMUCOSAL | Status: AC
  Administered 2023-03-27: 15 mL via OROMUCOSAL

## 2023-03-27 MED ORDER — LIDOCAINE HCL (CARDIAC) PF 100 MG/5ML IV SOSY
PREFILLED_SYRINGE | INTRAVENOUS | Status: DC | PRN
  Administered 2023-03-27: 60 mg via INTRAVENOUS

## 2023-03-27 MED ORDER — FENTANYL CITRATE (PF) 100 MCG/2ML IJ SOLN
INTRAMUSCULAR | Status: AC
  Filled 2023-03-27: qty 2

## 2023-03-27 MED ORDER — NITROFURANTOIN MONOHYD MACRO 100 MG PO CAPS
100.0000 mg | ORAL_CAPSULE | Freq: Every day | ORAL | Status: DC
  Administered 2023-03-27 – 2023-03-28 (×2): 100 mg via ORAL
  Filled 2023-03-27 (×3): qty 1

## 2023-03-27 MED ORDER — LACTATED RINGERS IV SOLN: INTRAVENOUS | Status: DC

## 2023-03-27 MED ORDER — TRIPLE ANTIBIOTIC 3.5-400-5000 EX OINT: 1.0000 | TOPICAL_OINTMENT | Freq: Three times a day (TID) | CUTANEOUS | Status: DC | PRN

## 2023-03-27 MED ORDER — DIPHENHYDRAMINE HCL 50 MG/ML IJ SOLN: 12.5000 mg | Freq: Four times a day (QID) | INTRAMUSCULAR | Status: DC | PRN

## 2023-03-27 MED ORDER — SUMATRIPTAN SUCCINATE 50 MG PO TABS: 50.0000 mg | ORAL_TABLET | Freq: Every day | ORAL | Status: DC | PRN

## 2023-03-27 MED ORDER — VENLAFAXINE HCL ER 75 MG PO CP24
75.0000 mg | ORAL_CAPSULE | Freq: Every day | ORAL | Status: DC
  Administered 2023-03-28 – 2023-03-29 (×2): 75 mg via ORAL
  Filled 2023-03-27 (×2): qty 1

## 2023-03-27 MED ORDER — ZOLPIDEM TARTRATE 5 MG PO TABS
10.0000 mg | ORAL_TABLET | Freq: Every evening | ORAL | Status: DC | PRN
  Administered 2023-03-27 – 2023-03-28 (×2): 10 mg via ORAL
  Filled 2023-03-27 (×2): qty 2

## 2023-03-27 MED ORDER — ONDANSETRON HCL 4 MG/2ML IJ SOLN: 4.0000 mg | Freq: Once | INTRAMUSCULAR | Status: DC | PRN

## 2023-03-27 MED ORDER — MOMETASONE FURO-FORMOTEROL FUM 200-5 MCG/ACT IN AERO
2.0000 | INHALATION_SPRAY | Freq: Two times a day (BID) | RESPIRATORY_TRACT | Status: DC
  Filled 2023-03-27: qty 8.8

## 2023-03-27 MED ORDER — SODIUM CHLORIDE 0.9 % IV SOLN: INTRAVENOUS | Status: DC

## 2023-03-27 MED ORDER — ALBUTEROL SULFATE (2.5 MG/3ML) 0.083% IN NEBU: 3.0000 mL | INHALATION_SOLUTION | Freq: Four times a day (QID) | RESPIRATORY_TRACT | Status: DC | PRN

## 2023-03-27 MED ORDER — PHENYLEPHRINE HCL (PRESSORS) 10 MG/ML IV SOLN
INTRAVENOUS | Status: DC | PRN
  Administered 2023-03-27 (×3): 160 ug via INTRAVENOUS

## 2023-03-27 MED ORDER — IPRATROPIUM BROMIDE 0.06 % NA SOLN
2.0000 | Freq: Two times a day (BID) | NASAL | Status: DC
  Administered 2023-03-27 – 2023-03-29 (×4): 2 via NASAL
  Filled 2023-03-27: qty 15

## 2023-03-27 MED ORDER — EPHEDRINE SULFATE (PRESSORS) 50 MG/ML IJ SOLN
INTRAMUSCULAR | Status: DC | PRN
  Administered 2023-03-27 (×2): 10 mg via INTRAVENOUS
  Administered 2023-03-27: 5 mg via INTRAVENOUS
  Administered 2023-03-27 (×2): 10 mg via INTRAVENOUS

## 2023-03-27 MED ORDER — COLCHICINE 0.6 MG PO TABS
0.6000 mg | ORAL_TABLET | ORAL | Status: DC
  Administered 2023-03-28: 0.6 mg via ORAL
  Filled 2023-03-27: qty 1

## 2023-03-27 MED ORDER — ORAL CARE MOUTH RINSE: 15.0000 mL | Freq: Once | OROMUCOSAL | Status: AC

## 2023-03-27 MED ORDER — SALINE SPRAY 0.65 % NA SOLN
1.0000 | Freq: Two times a day (BID) | NASAL | Status: DC
  Administered 2023-03-27 – 2023-03-29 (×3): 1 via NASAL
  Filled 2023-03-27: qty 44

## 2023-03-27 MED ORDER — OXYBUTYNIN CHLORIDE 5 MG PO TABS: 5.0000 mg | ORAL_TABLET | Freq: Three times a day (TID) | ORAL | Status: DC | PRN

## 2023-03-27 MED ORDER — ALPRAZOLAM 0.25 MG PO TABS
0.2500 mg | ORAL_TABLET | Freq: Three times a day (TID) | ORAL | Status: DC | PRN
  Administered 2023-03-27 – 2023-03-28 (×5): 0.25 mg via ORAL
  Filled 2023-03-27 (×6): qty 1

## 2023-03-27 MED ORDER — OXYCODONE HCL 5 MG PO TABS
5.0000 mg | ORAL_TABLET | ORAL | Status: DC | PRN
  Administered 2023-03-27 – 2023-03-29 (×8): 5 mg via ORAL
  Filled 2023-03-27 (×9): qty 1

## 2023-03-27 MED ORDER — ONDANSETRON HCL 4 MG/2ML IJ SOLN: 4.0000 mg | INTRAMUSCULAR | Status: DC | PRN

## 2023-03-27 MED ORDER — 0.9 % SODIUM CHLORIDE (POUR BTL) OPTIME
TOPICAL | Status: DC | PRN
  Administered 2023-03-27: 1000 mL

## 2023-03-27 MED ORDER — ROCURONIUM BROMIDE 100 MG/10ML IV SOLN
INTRAVENOUS | Status: DC | PRN
  Administered 2023-03-27: 30 mg via INTRAVENOUS
  Administered 2023-03-27: 40 mg via INTRAVENOUS

## 2023-03-27 MED ORDER — LIOTHYRONINE SODIUM 25 MCG PO TABS
25.0000 ug | ORAL_TABLET | Freq: Every day | ORAL | Status: DC
  Administered 2023-03-28 – 2023-03-29 (×2): 25 ug via ORAL
  Filled 2023-03-27 (×2): qty 1

## 2023-03-27 MED ORDER — DIPHENHYDRAMINE HCL 12.5 MG/5ML PO ELIX: 12.5000 mg | ORAL_SOLUTION | Freq: Four times a day (QID) | ORAL | Status: DC | PRN

## 2023-03-27 MED ORDER — OLOPATADINE HCL 0.1 % OP SOLN: 1.0000 [drp] | Freq: Two times a day (BID) | OPHTHALMIC | Status: DC | PRN

## 2023-03-27 MED ORDER — LOSARTAN POTASSIUM 50 MG PO TABS
50.0000 mg | ORAL_TABLET | Freq: Every day | ORAL | Status: DC
  Administered 2023-03-27 – 2023-03-29 (×3): 50 mg via ORAL
  Filled 2023-03-27 (×3): qty 1

## 2023-03-27 MED ORDER — COQ10 100 MG PO CAPS: 100.0000 mg | ORAL_CAPSULE | Freq: Every evening | ORAL | Status: DC

## 2023-03-27 MED ORDER — SUGAMMADEX SODIUM 500 MG/5ML IV SOLN
INTRAVENOUS | Status: DC | PRN
  Administered 2023-03-27: 400 mg via INTRAVENOUS

## 2023-03-27 MED ORDER — SODIUM CHLORIDE 0.9 % IR SOLN
3000.0000 mL | Status: DC
  Administered 2023-03-27: 3000 mL

## 2023-03-27 MED ORDER — BENZONATATE 100 MG PO CAPS
200.0000 mg | ORAL_CAPSULE | Freq: Three times a day (TID) | ORAL | Status: DC | PRN
  Administered 2023-03-28: 200 mg via ORAL
  Filled 2023-03-27: qty 2

## 2023-03-27 MED ORDER — FENTANYL CITRATE PF 50 MCG/ML IJ SOSY
PREFILLED_SYRINGE | INTRAMUSCULAR | Status: AC
  Filled 2023-03-27: qty 1

## 2023-03-27 MED ORDER — DEXTROMETHORPHAN POLISTIREX ER 30 MG/5ML PO SUER
30.0000 mg | Freq: Every day | ORAL | Status: DC | PRN
  Administered 2023-03-29: 30 mg via ORAL
  Filled 2023-03-27 (×2): qty 5

## 2023-03-27 MED ORDER — LORATADINE 10 MG PO TABS
10.0000 mg | ORAL_TABLET | Freq: Every day | ORAL | Status: DC
  Administered 2023-03-27 – 2023-03-29 (×3): 10 mg via ORAL
  Filled 2023-03-27 (×3): qty 1

## 2023-03-27 MED ORDER — ZOLPIDEM TARTRATE 5 MG PO TABS: 5.0000 mg | ORAL_TABLET | Freq: Every evening | ORAL | Status: DC | PRN

## 2023-03-27 MED ORDER — KETOROLAC TROMETHAMINE 15 MG/ML IJ SOLN
INTRAMUSCULAR | Status: AC
  Filled 2023-03-27: qty 1

## 2023-03-27 MED ORDER — METHOCARBAMOL 500 MG PO TABS
ORAL_TABLET | ORAL | Status: AC
  Filled 2023-03-27: qty 1

## 2023-03-27 MED ORDER — ACETAMINOPHEN 10 MG/ML IV SOLN
1000.0000 mg | Freq: Once | INTRAVENOUS | Status: DC | PRN
  Administered 2023-03-27: 1000 mg via INTRAVENOUS

## 2023-03-27 MED ORDER — MIDAZOLAM HCL 5 MG/5ML IJ SOLN
INTRAMUSCULAR | Status: DC | PRN
  Administered 2023-03-27 (×2): 2 mg via INTRAVENOUS

## 2023-03-27 MED ORDER — PHENYLEPHRINE 80 MCG/ML (10ML) SYRINGE FOR IV PUSH (FOR BLOOD PRESSURE SUPPORT)
PREFILLED_SYRINGE | INTRAVENOUS | Status: AC
  Filled 2023-03-27: qty 10

## 2023-03-27 MED ORDER — TAMSULOSIN HCL 0.4 MG PO CAPS
0.4000 mg | ORAL_CAPSULE | Freq: Every day | ORAL | Status: DC
  Administered 2023-03-28: 0.4 mg via ORAL
  Filled 2023-03-27: qty 1

## 2023-03-27 MED ORDER — ACETAMINOPHEN 10 MG/ML IV SOLN
INTRAVENOUS | Status: AC
  Filled 2023-03-27: qty 100

## 2023-03-27 MED ORDER — SODIUM CHLORIDE 0.9 % IR SOLN
Status: DC | PRN
  Administered 2023-03-27: 12000 mL via INTRAVESICAL

## 2023-03-27 MED ORDER — MIDAZOLAM HCL 2 MG/2ML IJ SOLN
INTRAMUSCULAR | Status: AC
  Filled 2023-03-27: qty 2

## 2023-03-27 MED ORDER — LEVOTHYROXINE SODIUM 112 MCG PO TABS
112.0000 ug | ORAL_TABLET | Freq: Every day | ORAL | Status: DC
  Administered 2023-03-28 – 2023-03-29 (×2): 112 ug via ORAL
  Filled 2023-03-27 (×2): qty 1

## 2023-03-27 MED ORDER — DOCUSATE SODIUM 100 MG PO CAPS
100.0000 mg | ORAL_CAPSULE | Freq: Two times a day (BID) | ORAL | Status: DC
  Administered 2023-03-27 – 2023-03-29 (×5): 100 mg via ORAL
  Filled 2023-03-27 (×5): qty 1

## 2023-03-27 MED ORDER — FINASTERIDE 5 MG PO TABS
5.0000 mg | ORAL_TABLET | Freq: Every day | ORAL | Status: DC
  Administered 2023-03-27 – 2023-03-28 (×2): 5 mg via ORAL
  Filled 2023-03-27 (×2): qty 1

## 2023-03-27 MED ORDER — FENTANYL CITRATE (PF) 100 MCG/2ML IJ SOLN
INTRAMUSCULAR | Status: DC | PRN
  Administered 2023-03-27 (×2): 50 ug via INTRAVENOUS
  Administered 2023-03-27: 100 ug via INTRAVENOUS

## 2023-03-27 MED ORDER — LIDOCAINE HCL (PF) 2 % IJ SOLN
INTRAMUSCULAR | Status: AC
  Filled 2023-03-27: qty 5

## 2023-03-27 MED ORDER — OXYCODONE HCL 5 MG PO TABS
10.0000 mg | ORAL_TABLET | Freq: Once | ORAL | Status: AC
  Administered 2023-03-27: 10 mg via ORAL

## 2023-03-27 MED ORDER — ONDANSETRON HCL 4 MG/2ML IJ SOLN
INTRAMUSCULAR | Status: DC | PRN
  Administered 2023-03-27: 4 mg via INTRAVENOUS

## 2023-03-27 MED ORDER — FENTANYL CITRATE PF 50 MCG/ML IJ SOSY
25.0000 ug | PREFILLED_SYRINGE | INTRAMUSCULAR | Status: DC | PRN
  Administered 2023-03-27 (×3): 50 ug via INTRAVENOUS

## 2023-03-27 MED ORDER — ACETAMINOPHEN 325 MG PO TABS
650.0000 mg | ORAL_TABLET | ORAL | Status: DC | PRN
  Administered 2023-03-28 – 2023-03-29 (×2): 650 mg via ORAL
  Filled 2023-03-27 (×2): qty 2

## 2023-03-27 MED ORDER — ONDANSETRON HCL 4 MG/2ML IJ SOLN
INTRAMUSCULAR | Status: AC
  Filled 2023-03-27: qty 2

## 2023-03-27 MED ORDER — PROPOFOL 10 MG/ML IV BOLUS
INTRAVENOUS | Status: DC | PRN
  Administered 2023-03-27: 200 mg via INTRAVENOUS

## 2023-03-27 MED ORDER — NITROFURANTOIN MONOHYD MACRO 100 MG PO CAPS: 100.0000 mg | ORAL_CAPSULE | Freq: Every day | ORAL | 0 refills | Status: AC

## 2023-03-27 MED ORDER — GUAIFENESIN ER 600 MG PO TB12
1200.0000 mg | ORAL_TABLET | Freq: Two times a day (BID) | ORAL | Status: DC | PRN
  Administered 2023-03-29: 1200 mg via ORAL
  Filled 2023-03-27: qty 2

## 2023-03-27 MED ORDER — SACCHAROMYCES BOULARDII 250 MG PO CAPS
250.0000 mg | ORAL_CAPSULE | Freq: Every morning | ORAL | Status: DC
  Administered 2023-03-27 – 2023-03-29 (×3): 250 mg via ORAL
  Filled 2023-03-27 (×3): qty 1

## 2023-03-27 MED ORDER — TRANEXAMIC ACID-NACL 1000-0.7 MG/100ML-% IV SOLN
1000.0000 mg | INTRAVENOUS | Status: AC
  Administered 2023-03-27: 1000 mg via INTRAVENOUS
  Filled 2023-03-27: qty 100

## 2023-03-27 MED ORDER — METOPROLOL TARTRATE 50 MG PO TABS
50.0000 mg | ORAL_TABLET | Freq: Two times a day (BID) | ORAL | Status: DC
  Administered 2023-03-27 – 2023-03-29 (×4): 50 mg via ORAL
  Filled 2023-03-27 (×4): qty 1

## 2023-03-27 MED ORDER — AMISULPRIDE (ANTIEMETIC) 5 MG/2ML IV SOLN: 10.0000 mg | Freq: Once | INTRAVENOUS | Status: DC | PRN

## 2023-03-27 SURGICAL SUPPLY — 26 items
BAG DRN RND TRDRP ANRFLXCHMBR (UROLOGICAL SUPPLIES) ×1
BAG URINE DRAIN 2000ML AR STRL (UROLOGICAL SUPPLIES) ×2 IMPLANT
CANISTER SUCT 3000ML PPV (MISCELLANEOUS) ×2 IMPLANT
CATH HEMA 3WAY 30CC 22FR COUDE (CATHETERS) IMPLANT
CATH HEMA 3WAY 30CC 24FR COUDE (CATHETERS) IMPLANT
COVER MAYO STAND STRL (DRAPES) ×2 IMPLANT
DRAPE FOOT SWITCH (DRAPES) ×2 IMPLANT
GEL ULTRASOUND 8.5O AQUASONIC (MISCELLANEOUS) ×2 IMPLANT
GLOVE SURG LX STRL 7.5 STRW (GLOVE) ×2 IMPLANT
GOWN STRL REUS W/ TWL XL LVL3 (GOWN DISPOSABLE) ×2 IMPLANT
GOWN STRL REUS W/TWL XL LVL3 (GOWN DISPOSABLE) ×1
HANDPIECE AQUABEAM (MISCELLANEOUS) ×2 IMPLANT
HOLDER FOLEY CATH W/STRAP (MISCELLANEOUS) IMPLANT
KIT TURNOVER KIT A (KITS) IMPLANT
LOOP CUT BIPOLAR 24F LRG (ELECTROSURGICAL) IMPLANT
MANIFOLD NEPTUNE II (INSTRUMENTS) ×2 IMPLANT
MAT ABSORB FLUID 56X50 GRAY (MISCELLANEOUS) ×2 IMPLANT
PACK CYSTO (CUSTOM PROCEDURE TRAY) ×2 IMPLANT
PACK DRAPE AQUABEAM (MISCELLANEOUS) ×2 IMPLANT
SYR 30ML LL (SYRINGE) ×2 IMPLANT
SYR TOOMEY IRRIG 70ML (MISCELLANEOUS) ×2
SYRINGE TOOMEY IRRIG 70ML (MISCELLANEOUS) ×4 IMPLANT
TOWEL OR 17X24 6PK STRL BLUE (TOWEL DISPOSABLE) ×2 IMPLANT
TUBING CONNECTING 10 (TUBING) ×4 IMPLANT
TUBING UROLOGY SET (TUBING) ×2 IMPLANT
UNDERPAD 30X36 HEAVY ABSORB (UNDERPADS AND DIAPERS) ×2 IMPLANT

## 2023-03-27 NOTE — Plan of Care (Signed)
  Problem: Education: Goal: Knowledge of the prescribed therapeutic regimen will improve Outcome: Progressing   Problem: Bowel/Gastric: Goal: Gastrointestinal status for postoperative course will improve Outcome: Progressing   Problem: Health Behavior/Discharge Planning: Goal: Identification of resources available to assist in meeting health care needs will improve Outcome: Progressing   Problem: Skin Integrity: Goal: Demonstration of wound healing without infection will improve Outcome: Progressing   Problem: Urinary Elimination: Goal: Ability to avoid or minimize complications of infection will improve Outcome: Progressing   Problem: Education: Goal: Knowledge of General Education information will improve Description: Including pain rating scale, medication(s)/side effects and non-pharmacologic comfort measures Outcome: Progressing   Problem: Health Behavior/Discharge Planning: Goal: Ability to manage health-related needs will improve Outcome: Progressing   Problem: Clinical Measurements: Goal: Ability to maintain clinical measurements within normal limits will improve Outcome: Progressing Goal: Will remain free from infection Outcome: Progressing Goal: Diagnostic test results will improve Outcome: Progressing Goal: Respiratory complications will improve Outcome: Progressing Goal: Cardiovascular complication will be avoided Outcome: Progressing   Problem: Activity: Goal: Risk for activity intolerance will decrease Outcome: Progressing   Problem: Nutrition: Goal: Adequate nutrition will be maintained Outcome: Progressing   Problem: Coping: Goal: Level of anxiety will decrease Outcome: Progressing   Problem: Elimination: Goal: Will not experience complications related to bowel motility Outcome: Progressing Goal: Will not experience complications related to urinary retention Outcome: Progressing   Problem: Pain Managment: Goal: General experience of comfort  will improve Outcome: Progressing   Problem: Safety: Goal: Ability to remain free from injury will improve Outcome: Progressing   Problem: Skin Integrity: Goal: Risk for impaired skin integrity will decrease Outcome: Progressing   

## 2023-03-27 NOTE — Progress Notes (Signed)
Grant [name] was seen and examined on rounds this evening.   Urine grade 1-2 on moderate cbi gtt. Light traction.   He would like to sit in chair which should be OK.   D/c traction in AM and wean CBI. Home tomorrow with foley.

## 2023-03-27 NOTE — Op Note (Signed)
Preoperative diagnosis: BPH with lower urinary tract symptoms, weak stream, frequency  Postoperative diagnosis: Same   Procedure: Robotic water jet ablation of the prostate   Surgeon: Mena Goes   Anesthesia: General   Indication for procedure:   Findings:  EUA-penis circumcised without mass or lesion.  Meatus and glans appeared normal.  Scrotum appeared normal.  On DRE prostate was about 50 g and smooth without hard area or nodule.  After all ablation, resection and catheter placement DRE was normal with no blood on the gloved finger and intact rectal mucosa.  Cystoscopy revealed preablation 100% visual occlusion with large lateral lobes.  Tall bladder neck, ureteral orifice ease identified.  Moderate bladder trabeculation.  Post ablation and resection cystoscopy revealed again intact bladder, ureteral orifice ease identified and were intact and normal without injury.  Bladder neck was intact.  Just inside the right bladder neck at the 7 o'clock position there was a small perforation of the capsule.  Otherwise excellent channel.  Verumontanum and apex intact and ablation halted proximally.  Prostate was well over 5 cm wide and tall on ultrasound.  After catheter placement ultrasound was reinserted per rectum and balloon was noted to be in the bladder and seated properly at the bladder neck.  Next repeat exam was performed and noted to be normal.   Description of procedure:  He was brought to the operating room and placed supine on the operating table.  After adequate anesthesia he was placed lithotomy position. Timeout was performed to confirm the patient and procedure. The TRUS Stepper was mounted to the Articulating Arm and secured to OR bed. The ultrasound probe was attached to the stepper. Exam under anesthesia was performed and the TRUS was inserted per rectum.  There was no resistance. The ultrasound probe was aligned, and confirmation made that the prostate is centered and aligned using both  transverse and sagittal views. The bladder neck, verumontanum and the central/transition zones were identified.  Genitalia were prepped and draped in the usual sterile fashion. The 31F AQUABEAM Handpiece is inserted into the prostatic urethra and a complete cystoscopic evaluation was performed by inspecting the prostate, bladder, and identifying the location of the verumontanum/external sphincter. The AQUABEAM Handpiece was secured to the Handpiece Articulating Arm. Confirmed alignment of AQUABEAM Handpiece and TRUS Probe to be parallel and colinear. Confirmation that AQUABEAM nozzle is centered and anterior of the bladder neck or the median lobe. The cystoscope was then retracted to visualize the verumontanum and external sphincter and the cystoscope tip was positioned just proximal to the external sphincter. Reconfirmed alignment of the TRUS probe with the AQUABEAM Handpiece and compression applied with TRUS probe. Horizontal alignment of the Handpiece waterjet nozzle was performed. The Aquablation treatment zones were planned utilizing real-time TRUS to visualize the contour of the prostate and the depth and radial angles of resection were defined in the transverse view. In the sagittal view, the AQUABEAM nozzle is identified and position registered with software. The treatment contours were then adjusted to conform to the intended resection margins. No median lobe. The bladder neck and verumontanum were marked and confirmed in the treatment contour.  Actually went conservative on the start of the treatment in the bladder neck bringing those both together and slightly more superior.  The Aquablation Treatment was then started following the resection contour confirmed under ultrasound guidance. TOTAL AQUABLATION RESECTION TIME: 4:30 x 2 = 9 min  Once Aquablation resection was complete the 24 French aqua beam handpiece was carefully removed.  The  continuous-flow sheath with the visual obturator was passed and  then the loop and handle.  The trigone and the ureteral orifices were identified.  Resection of some of the residual bladder neck tissue was done.  The bladder neck was identified at 6:00 and this was taken up to 12:00 with fulguration of the bladder neck and prostate for hemostasis on the left and then the right. The small 7:00 perforation was in the prostatic urethra and just distal to the bladder neck..  Slight amount of anterior tissue was resected.  Similarly from 6:00 up to 12:00 on the right side of the bladder neck was identified by resecting some of the ablated tissue to identify the bladder neck and cauterize any bleeding.  Some anterior tissue on the left was resected.  This created excellent hemostasis.  All the chips were evacuated.  Ureteral orifices again identified and noted to be normal without injury.  The scope was backed out and a zip wire was advanced.  This sheath was backed out.  A 22 French hematuria catheter was placed with 30 cc in the balloon.  The balloon was seated at the bladder neck and it was irrigated on light traction and noted to be clear to pink.  He was hooked up to CBI.  He was cleaned up and placed supine.  Catheter was placed on traction.  He was awakened and taken to the cover room in stable condition.  Complications: None  Blood loss: 100 mL  Specimens: None  Drains: 22 French three-way hematuria catheter with 30 cc in the balloon  Disposition: Patient stable to PACU

## 2023-03-27 NOTE — Transfer of Care (Signed)
Immediate Anesthesia Transfer of Care Note  Patient: Grant [name]  Procedure(s) Performed: TRANSURETHRAL WATERJET ABLATION OF PROSTATE  Patient Location: PACU  Anesthesia Type:General  Level of Consciousness: awake, alert , and patient cooperative  Airway & Oxygen Therapy: Patient Spontanous Breathing and Patient connected to face mask oxygen  Post-op Assessment: Report given to RN, Post -op Vital signs reviewed and stable, and Patient moving all extremities X 4  Post vital signs: Reviewed and stable  Last Vitals:  Vitals Value Taken Time  BP 141/91   Temp    Pulse 58   Resp 20   SpO2 100     Last Pain:  Vitals:   03/27/23 0624  TempSrc:   PainSc: 0-No pain         Complications: No notable events documented.

## 2023-03-27 NOTE — Anesthesia Preprocedure Evaluation (Addendum)
Anesthesia Evaluation  Patient identified by MRN, date of birth, ID band Patient awake    Reviewed: Allergy & Precautions, NPO status , Patient's Chart, lab work & pertinent test results  Airway Mallampati: II  TM Distance: >3 FB Neck ROM: Full    Dental no notable dental hx.    Pulmonary asthma , sleep apnea and Continuous Positive Airway Pressure Ventilation , former smoker   Pulmonary exam normal        Cardiovascular hypertension, Pt. on medications and Pt. on home beta blockers Normal cardiovascular exam+ dysrhythmias Atrial Fibrillation      Neuro/Psych  Headaches PSYCHIATRIC DISORDERS Anxiety Depression     Neuromuscular disease CVA    GI/Hepatic negative GI ROS, Neg liver ROS,,,  Endo/Other  Hypothyroidism    Renal/GU Renal disease     Musculoskeletal  (+) Arthritis ,  Fibromyalgia -  Abdominal  (+) + obese  Peds  Hematology negative hematology ROS (+)   Anesthesia Other Findings BENIGN PROSTATE HYPERPLASIA  Reproductive/Obstetrics                             Anesthesia Physical Anesthesia Plan  ASA: 3  Anesthesia Plan: General   Post-op Pain Management:    Induction: Intravenous  PONV Risk Score and Plan: 2 and Ondansetron, Dexamethasone, Midazolam and Treatment may vary due to age or medical condition  Airway Management Planned: LMA  Additional Equipment:   Intra-op Plan:   Post-operative Plan: Extubation in OR  Informed Consent: I have reviewed the patients History and Physical, chart, labs and discussed the procedure including the risks, benefits and alternatives for the proposed anesthesia with the patient or authorized representative who has indicated his/her understanding and acceptance.     Dental advisory given  Plan Discussed with: CRNA  Anesthesia Plan Comments:         Anesthesia Quick Evaluation

## 2023-03-27 NOTE — Discharge Instructions (Signed)
Robotic water jet ablation of the Prostate, Care After The following information offers guidance on how to care for yourself after your procedure. Your health care provider may also give you more specific instructions. If you have problems or questions, contact your health care provider. What can I expect after the procedure? After the procedure, it is common to have: Mild pain in your lower abdomen. Soreness or mild discomfort in your penis or when you urinate. This is from having the catheter inserted during the procedure. A sudden urge to urinate (urgency). A need to urinate often. A small amount of blood in your urine. You may notice some small blood clots in your urine. These are normal. Follow these instructions at home: Medicines Take over-the-counter and prescription medicines only as told by your health care provider. If you were prescribed an antibiotic medicine, take it as told by your health care provider. Do not stop taking the antibiotic even if you start to feel better. Activity  Rest as told by your health care provider. Avoid sitting for a long time without moving. Get up to take short walks every 1-2 hours. This is important to improve blood flow and breathing. Ask for help if you feel weak or unsteady. You may increase your physical activity gradually as you start to feel better. Do not drive or operate machinery until your health care provider says that it is safe. Do not ride in a car for long periods of time, or as told by your health care provider. Avoid intense physical activity for as long as told by your health care provider. Do not lift anything that is heavier than 10 lb (4.5 kg), or the limit that you are told, until your health care provider says that it is safe. Do not have sex until your health care provider approves. Return to your normal activities as told by your health care provider. Ask your health care provider what activities are safe for you. Preventing  constipation  You may need to take these actions to prevent or treat constipation: Drink enough fluid to keep your urine pale yellow. Take over-the-counter or prescription medicines. Eat foods that are high in fiber, such as beans, whole grains, and fresh fruits and vegetables. Limit foods that are high in fat and processed sugars, such as fried or sweet foods.   General instructions Do not strain when you have a bowel movement. Straining may lead to bleeding from the prostate. This may cause blood clots and trouble urinating. Do not use any products that contain nicotine or tobacco. These products include cigarettes, chewing tobacco, and vaping devices, such as e-cigarettes. If you need help quitting, ask your health care provider. If you go home with a tube draining your urine (urinary catheter), care for the catheter as told by your health care provider. Wear compression stockings as told by your health care provider. These stockings help to prevent blood clots and reduce swelling in your legs. Keep all follow-up visits. This is important. Contact a health care provider if: You have signs of infection, such as: Fever or chills. Urine that smells very bad. Swelling around your urethra that is getting worse. Swelling in your penis or testicles. You have difficulty urinating. You have pain that gets worse or does not improve with medicine. You have blood in your urine that does not go away after 1 week of resting and drinking more fluids. You have trouble having a bowel movement. You have trouble having or keeping an erection. No  semen comes out during orgasm (dry ejaculation). You have a urinary catheter in place, and you have: Spasms or pain. Problems with your catheter or your catheter is blocked. Get help right away if: You are unable to urinate. You are having more blood clots in your urine instead of fewer. You have: Large blood clots. A lot of blood in your urine. Pain in  your back or lower abdomen. You have difficulty breathing or shortness of breath. You develop swelling or pain in your leg. These symptoms may be an emergency. Get help right away. Call 911. Do not wait to see if the symptoms will go away. Do not drive yourself to the hospital. Summary After the procedure, it is common to have a small amount of blood in your urine. Follow restrictions about lifting and sexual activity as told by your health care provider. Ask what activities are safe for you. Keep all follow-up visits. This is important. This information is not intended to replace advice given to you by your health care provider. Make sure you discuss any questions you have with your health care provider. Document Revised: 07/02/2021 Document Reviewed: 07/02/2021 Elsevier Patient Education  2024 [employer].

## 2023-03-27 NOTE — Plan of Care (Signed)
  Problem: Education: Goal: Knowledge of General Education information will improve Description: Including pain rating scale, medication(s)/side effects and non-pharmacologic comfort measures Outcome: Progressing   Problem: Health Behavior/Discharge Planning: Goal: Ability to manage health-related needs will improve Outcome: Progressing   Problem: Clinical Measurements: Goal: Will remain free from infection Outcome: Progressing Goal: Diagnostic test results will improve Outcome: Progressing Goal: Respiratory complications will improve Outcome: Progressing   Problem: Activity: Goal: Risk for activity intolerance will decrease Outcome: Progressing   Problem: Nutrition: Goal: Adequate nutrition will be maintained Outcome: Progressing   Problem: Coping: Goal: Level of anxiety will decrease Outcome: Progressing   

## 2023-03-27 NOTE — Anesthesia Procedure Notes (Signed)
Procedure Name: Intubation Date/Time: 03/27/2023 8:05 AM  Performed by: Shanon Payor, CRNAPre-anesthesia Checklist: Patient identified, Emergency Drugs available, Suction available, Patient being monitored and Timeout performed Patient Re-evaluated:Patient Re-evaluated prior to induction Oxygen Delivery Method: Circle system utilized Preoxygenation: Pre-oxygenation with 100% oxygen Induction Type: IV induction Ventilation: Mask ventilation without difficulty Laryngoscope Size: Mac and 4 Grade View: Grade I Tube type: Oral Tube size: 7.5 mm Number of attempts: 1 Airway Equipment and Method: Stylet Placement Confirmation: ETT inserted through vocal cords under direct vision, positive ETCO2, CO2 detector and breath sounds checked- equal and bilateral Secured at: 23 cm Tube secured with: Tape Dental Injury: Teeth and Oropharynx as per pre-operative assessment

## 2023-03-27 NOTE — Anesthesia Postprocedure Evaluation (Signed)
Anesthesia Post Note  Patient: Grant [name]  Procedure(s) Performed: TRANSURETHRAL WATERJET ABLATION OF PROSTATE     Patient location during evaluation: PACU Anesthesia Type: General Level of consciousness: awake Pain management: pain level controlled Vital Signs Assessment: post-procedure vital signs reviewed and stable Respiratory status: spontaneous breathing, nonlabored ventilation and respiratory function stable Cardiovascular status: blood pressure returned to baseline and stable Postop Assessment: no apparent nausea or vomiting Anesthetic complications: no   No notable events documented.  Last Vitals:  Vitals:   03/27/23 1300 03/27/23 1352  BP: 132/82 (!) 113/94  Pulse: 61 78  Resp: 13 17  Temp:  36.7 C  SpO2: 95% 94%    Last Pain:  Vitals:   03/27/23 1532  TempSrc:   PainSc: 3                  Chailyn Racette P Kalasia Crafton

## 2023-03-27 NOTE — H&P (Signed)
H&P  Chief Complaint: BPH, weak stream, frequency  History of Present Illness: Grant [name] is a 72 year old male with a large prostate and bothersome frequency, weak stream and urgency.  He has been on medical therapy with tamsulosin and finasteride.  He had a CT scan in June 2023 which showed a 110 g prostate.  He had a post void in the 100-200s.  UroCuff was equivocal order unobstructed but on cystoscopy he had clear significant BPH and visual bladder outlet obstruction.  We discussed other options such as overactive bladder medicines or procedures but he elected for surgical therapy for his BPH first.  He would like to get off some of the medicines.  He presents for aqua ablation of the prostate.  He has been off of Xarelto since 03/22/2023 at his discretion.  He has had no dysuria or gross hematuria.  No cold or congestion.  No fever.  He reports a cough for a year.  He has some parkinsonism and chorea.  Past Medical History:  Diagnosis Date   Allergic rhinitis    Anxiety    Arthritis    Asthma    Bladder tumor    Chronic fatigue    COVID-19 05/22/2021   Depression    06/30/17 Pt denies being depressed, reports Effexor is taken for Chronic Fatigue    Dyspnea    Enlarged prostate    Fibromyalgia    GERD (gastroesophageal reflux disease)    History of chronic bronchitis    History of migraine    light and sound sensitive   History of toxic encephalopathy    Hypothyroidism    OSA on CPAP    CPAP 14   PAF (paroxysmal atrial fibrillation) (HCC) CARDIOLOGIST -- DR Eden Emms   DX OCT 2013   Pneumonia 2023   S/P AVR (aortic valve replacement) and aortoplasty    Stroke (HCC)    has post stroke chorea   Unspecified essential hypertension    Urethral tumor    PROSTATIC   Past Surgical History:  Procedure Laterality Date   BENTALL PROCEDURE N/A 07/02/2017   Procedure: BENTALL PROCEDURE;  Surgeon: Kerin Perna, MD;  [facility];  Service: Open Heart Surgery;  Laterality: N/A;  WITH CIRC  ARREST   BENTALL PROCEDURE  07/02/2017   w/ Aortic valve replacement    by Dr. Donata Clay   BIOPSY  02/06/2021   Procedure: BIOPSY;  Surgeon: Vida Rigger, MD;  Location: [facility] ENDOSCOPY;  Service: Endoscopy;;   BUBBLE STUDY  11/01/2021   Procedure: BUBBLE STUDY;  Surgeon: Sande Rives, MD;  Location[facility] ENDOSCOPY;  Service: Cardiovascular;;   CARDIOVERSION N/A 11/28/2021   Procedure: CARDIOVERSION (CATH LAB);  Surgeon: Lanier Prude, MD;  [facility];  Service: Cardiovascular;  Laterality: N/A;   COLONOSCOPY     COLONOSCOPY WITH PROPOFOL N/A 02/06/2021   Procedure: COLONOSCOPY WITH PROPOFOL;  Surgeon: Vida Rigger, MD;  Location: [facility] ENDOSCOPY;  Service: Endoscopy;  Laterality: N/A;   CORONARY ANGIOPLASTY  06/12/2017   CYSTOSCOPY W/ RETROGRADES Bilateral 06/15/2013   Procedure: CYSTOSCOPY WITH BILATERAL RETROGRADE PYELOGRAM  BLADDER BIOPSY, PROSTATIC URETHRAL BIOPSY, ;  Surgeon: Milford Cage, MD;  Location: [facility];  Service: Urology;  Laterality: Bilateral;   ESOPHAGOGASTRODUODENOSCOPY (EGD) WITH PROPOFOL N/A 03/11/2018   Procedure: ESOPHAGOGASTRODUODENOSCOPY (EGD) WITH PROPOFOL;  Surgeon: Vida Rigger, MD;  Location: [facility] ENDOSCOPY;  Service: Endoscopy;  Laterality: N/A;   EXCISION OF MESH N/A 06/11/2022   Procedure: REMOVAL OF OLD MESH;  Surgeon: Karie Soda, MD;  Location: [facility] ORS;  Service: General;  Laterality: N/A;   LAPAROSCOPIC CHOLECYSTECTOMY  01/14/2001   PROCTOSCOPY N/A 06/12/2021   Procedure: RIGID PROCTOSCOPY;  Surgeon: Karie Soda, MD;  Location: [facility] ORS;  Service: General;  Laterality: N/A;   RIGHT/LEFT HEART CATH AND CORONARY ANGIOGRAPHY N/A 06/12/2017   Procedure: RIGHT/LEFT HEART CATH AND CORONARY ANGIOGRAPHY;  Surgeon: Laurey Morale, MD;  Location[facility] INVASIVE CV LAB;  Service: Cardiovascular;  Laterality: N/A;   TEE WITHOUT CARDIOVERSION N/A 07/02/2017   Procedure: TRANSESOPHAGEAL ECHOCARDIOGRAM (TEE);  Surgeon: Donata Clay, Theron Arista, MD;  Location: [facility] OR;  Service: Open Heart Surgery;  Laterality: N/A;   TEE WITHOUT CARDIOVERSION N/A 11/01/2021   Procedure: TRANSESOPHAGEAL ECHOCARDIOGRAM (TEE);  Surgeon: Sande Rives, MD;  Location: [facility] ENDOSCOPY;  Service: Cardiovascular;  Laterality: N/A;   UMBILICAL HERNIA REPAIR  01/14/2008   VENTRAL HERNIA REPAIR N/A 06/11/2022   Procedure: LAPAROSCOPIC VENTRAL HERNIA WITH LYSIS OF ADHESIONS;  Surgeon: Karie Soda, MD;  Location: [facility] ORS;  Service: General;  Laterality: N/A;  WITH MESH   WISDOM TOOTH EXTRACTION      Home Medications:  Facility-Administered Medications Prior to Admission  Medication Dose Route Frequency Provider Last Rate Last Admin   methylPREDNISolone acetate (DEPO-MEDROL) injection (RADIOLOGY ONLY) 120 mg  120 mg Intramuscular Once Omar Person, MD       Medications Prior to Admission  Medication Sig Dispense Refill Last Dose   acetaminophen (TYLENOL) 500 MG tablet Take 1,000 mg by mouth every 8 (eight) hours as needed for headache or moderate pain.   03/26/2023   albuterol (VENTOLIN HFA) 108 (90 Base) MCG/ACT inhaler INHALE 2 PUFFS INTO THE LUNGS EVERY 6 HOURS AS NEEDED FOR WHEEZING OR SHORTNESS OF BREATH 18 g 12 03/27/2023 at 0500   ALPRAZolam (XANAX) 0.25 MG tablet Take 1 tablet (0.25 mg total) by mouth 3 (three) times daily as needed for anxiety. (Patient taking differently: Take 0.25 mg by mouth 2 (two) times daily as needed for anxiety.) 15 tablet 0 03/27/2023 at 0500   aluminum hydroxide-magnesium carbonate (GAVISCON) 95-358 MG/15ML SUSP Take 15 mLs by mouth 3 (three) times daily as needed for indigestion or heartburn.   Past Month   amoxicillin-clavulanate (AUGMENTIN) 875-125 MG tablet Take 1 tablet by mouth 2 (two) times daily.   Past Month   azelastine (ASTELIN) 137 MCG/SPRAY nasal spray Place 1 spray into the nose 2 (two) times daily as needed for rhinitis or allergies.   03/26/2023   benzonatate (TESSALON) 200 MG capsule Take 200 mg by  mouth 3 (three) times daily as needed for cough.   03/26/2023   budesonide-formoterol (SYMBICORT) 160-4.5 MCG/ACT inhaler INHALE 2 PUFFS INTO THE LUNGS IN THE MORNING AND AT BEDTIME 10.2 g 12 03/27/2023 at 0500   cetirizine (ZYRTEC) 10 MG tablet Take 10 mg by mouth every morning.   03/26/2023   Coenzyme Q10 (COQ10) 100 MG CAPS Take 100 mg by mouth every evening.   03/26/2023   colchicine (COLCRYS) 0.6 MG tablet Take 0.6 mg by mouth every other day.   03/26/2023   dextromethorphan (DELSYM) 30 MG/5ML liquid Take 30 mg by mouth daily as needed for cough.   03/26/2023   diclofenac Sodium (VOLTAREN) 1 % GEL Apply 2 g topically 4 (four) times daily as needed (to affected areas- for arthritis pain.).   03/26/2023   diphenhydrAMINE (BENADRYL) 25 mg capsule Take 50 mg by mouth at bedtime as needed for allergies.   03/26/2023  Erenumab-aooe (AIMOVIG) 140 MG/ML SOAJ Inject 140 mg into the skin every 28 (twenty-eight) days. 1 mL 11 Past Month   Ferrous Sulfate (IRON) 142 (45 Fe) MG TBCR Take 142 mg by mouth in the morning and at bedtime.   03/26/2023   Fiber 500 MG CAPS Take 1,000 mg by mouth in the morning.   03/26/2023   finasteride (PROSCAR) 5 MG tablet Take 5 mg by mouth at bedtime.    03/26/2023   Guaifenesin (MUCINEX MAXIMUM STRENGTH) 1200 MG TB12 Take 1,200 mg by mouth 2 (two) times daily as needed (cough).   Past Month   ibuprofen (ADVIL) 200 MG tablet Take 400 mg by mouth 2 (two) times daily as needed for headache (or pain).   Past Week   ipratropium (ATROVENT) 0.06 % nasal spray Place 2 sprays into both nostrils 2 (two) times daily.   03/26/2023   ipratropium-albuterol (DUONEB) 0.5-2.5 (3) MG/3ML SOLN Take 3 mLs by nebulization every 6 (six) hours as needed. 360 mL 3 Past Week   levothyroxine (SYNTHROID, LEVOTHROID) 112 MCG tablet Take 112 mcg by mouth daily before breakfast.    03/27/2023 at 0500   liothyronine (CYTOMEL) 25 MCG tablet Take 25 mcg by mouth daily before breakfast.   03/27/2023 at 0500   losartan (COZAAR) 50 MG  tablet TAKE 1 TABLET BY MOUTH EVERY DAY 30 tablet 6 03/26/2023   Menthol, Topical Analgesic, (BLUE-EMU MAXIMUM STRENGTH EX) Apply 1 application topically 3 (three) times daily as needed (arthritis pain.).   03/26/2023   metoprolol tartrate (LOPRESSOR) 50 MG tablet TAKE 1 TABLET(50 MG) BY MOUTH TWICE DAILY 180 tablet 3 03/26/2023   Multiple Vitamin (MULTIVITAMIN WITH MINERALS) TABS tablet Take 1 tablet by mouth daily with breakfast.   Past Week   naproxen sodium (ALEVE) 220 MG tablet Take 220 mg by mouth 2 (two) times daily as needed (pain/headache).   Past Week   olopatadine (PATANOL) 0.1 % ophthalmic solution Place 1 drop into both eyes 2 (two) times daily as needed for allergies.   Past Week   omeprazole (PRILOSEC) 40 MG capsule Take 1 capsule (40 mg total) by mouth 2 (two) times daily. 180 capsule 1 03/27/2023 at 0500   OVER THE COUNTER MEDICATION Take 1 capsule by mouth 2 (two) times daily. Liposomal supplement   03/26/2023   PRESCRIPTION MEDICATION See admin instructions. CPAP- At bedtime   03/26/2023   rivaroxaban (XARELTO) 20 MG TABS tablet TAKE 1 TABLET(20 MG) BY MOUTH DAILY WITH SUPPER 90 tablet 3 03/22/2023   rizatriptan (MAXALT) 10 MG tablet TAKE 1 TABLET BY MOUTH AS NEEDED FOR MIGRAINE. MAY REPEAT IN 2 HOURS IF NEEDED 30 tablet 11 03/26/2023   saccharomyces boulardii (FLORASTOR) 250 MG capsule Take 250 mg by mouth every morning.   03/26/2023   sodium chloride (OCEAN) 0.65 % SOLN nasal spray Place 1 spray into both nostrils 2 (two) times daily.   03/27/2023 at 0400   tamsulosin (FLOMAX) 0.4 MG CAPS capsule Take 0.4 mg by mouth in the morning and at bedtime.   03/27/2023 at 0500   Testosterone Cypionate 200 MG/ML SOLN Inject 200 mg into the muscle See admin instructions. Inject 200 mg (1 ml) intramuscularly once every 7-10 days   03/19/2023   venlafaxine XR (EFFEXOR-XR) 75 MG 24 hr capsule TAKE 1 CAPSULE(75 MG) BY MOUTH DAILY WITH BREAKFAST 90 capsule 1 03/27/2023 at 0500   zolpidem (AMBIEN) 10 MG tablet Take 10 mg  by mouth at bedtime as needed for sleep.   03/26/2023  methocarbamol (ROBAXIN) 750 MG tablet Take 1 tablet (750 mg total) by mouth 4 (four) times daily as needed (use for muscle cramps/pain). 30 tablet 2 More than a month   Allergies:  Allergies  Allergen Reactions   Ingrezza [Valbenazine Tosylate] Other (See Comments)    PVC's   Perphenazine Swelling and Other (See Comments)    Tongue swelling   Amantadines     Feels bad   Amiodarone Swelling and Other (See Comments)    Tongue swelling   Codeine Itching   Prednisone Itching and Other (See Comments)    CAN TOLERATE DEPO. PREDNISONE BY MOUTH CAUSES MOOD CHANGES, INSOMNIA, AND ITCHING.    Tramadol Other (See Comments)    Delusions/hallucinations from high doses     Family History  Problem Relation Age of Onset   Aortic aneurysm Mother 21       cause of death   Other Father        motor vehicle accident   Heart disease Other        family history   Social History:  reports that he quit smoking about 43 years ago. His smoking use included cigarettes. He started smoking about 54 years ago. He has a 13.50 pack-year smoking history. He has never used smokeless tobacco. He reports current alcohol use of about 10.0 standard drinks of alcohol per week. He reports that he does not use drugs.  ROS: A complete review of systems was performed.  All systems are negative except for pertinent findings as noted. Review of Systems  All other systems reviewed and are negative.    Physical Exam:  Vital signs in last 24 hours: Temp:  [97.7 F (36.5 C)] 97.7 F (36.5 C) (06/07 0552) Pulse Rate:  [61] 61 (06/07 0552) Resp:  [18] 18 (06/07 0552) BP: (144)/(81) 144/81 (06/07 0552) SpO2:  [94 %] 94 % (06/07 0552) Weight:  [106.6 kg] 106.6 kg (06/07 0624) General:  Alert and oriented, No acute distress HEENT: Normocephalic, atraumatic Cardiovascular: Regular rate and rhythm Lungs: Regular rate and effort Abdomen: Soft, nontender,  nondistended, no abdominal masses Back: No CVA tenderness Extremities: No edema Neurologic: Grossly intact  Laboratory Data:  No results found for this or any previous visit (from the past 24 hour(s)). No results found for this or any previous visit (from the past 240 hour(s)). Creatinine: No results for input(s): "CREATININE" in the last 168 hours.  Impression/Assessment:  BPH with lower urinary tract symptoms but other neurologic issues-  Plan:  I discussed again with the patient and his wife the nature, potential benefits, risks and alternatives to robotic water jet ablation of the prostate, including side effects of the proposed treatment, the likelihood of the patient achieving the goals of the procedure, and any potential problems that might occur during the procedure or recuperation.  Again we discussed expectations for flow symptoms and irritative symptoms in a neurologically intact patient (not Jasan)-most of the time stream, frequency and urgency improved, but the frequency and urgency can persist or worsen.  Also we discussed in his case with his neurologic issues his stream issues could still be an issue if he has pelvic floor or bladder dysfunction.  We discussed again the unique nature of aqua ablation versus other BPH procedures as well as the risk of bleeding, infection AND incontinence among others.  We will keep him on an antibiotic while the Foley catheter is in place.  We discussed the postop Foley and catheter care. Xarelto and post-op bleeding risk  as well. We discussed the nature risk benefits and alternatives to a Foley catheter.  All questions answered.  Patient elects to proceed.   Jerilee Field 03/27/2023, 7:39 AM

## 2023-03-28 DIAGNOSIS — J45909 Unspecified asthma, uncomplicated: Secondary | ICD-10-CM | POA: Diagnosis not present

## 2023-03-28 DIAGNOSIS — Z8616 Personal history of COVID-19: Secondary | ICD-10-CM | POA: Diagnosis not present

## 2023-03-28 DIAGNOSIS — N401 Enlarged prostate with lower urinary tract symptoms: Secondary | ICD-10-CM | POA: Diagnosis not present

## 2023-03-28 DIAGNOSIS — Z952 Presence of prosthetic heart valve: Secondary | ICD-10-CM | POA: Diagnosis not present

## 2023-03-28 DIAGNOSIS — Z87891 Personal history of nicotine dependence: Secondary | ICD-10-CM | POA: Diagnosis not present

## 2023-03-28 DIAGNOSIS — Z79899 Other long term (current) drug therapy: Secondary | ICD-10-CM | POA: Diagnosis not present

## 2023-03-28 DIAGNOSIS — E039 Hypothyroidism, unspecified: Secondary | ICD-10-CM | POA: Diagnosis not present

## 2023-03-28 DIAGNOSIS — I10 Essential (primary) hypertension: Secondary | ICD-10-CM | POA: Diagnosis not present

## 2023-03-28 DIAGNOSIS — Z7901 Long term (current) use of anticoagulants: Secondary | ICD-10-CM | POA: Diagnosis not present

## 2023-03-28 DIAGNOSIS — R350 Frequency of micturition: Secondary | ICD-10-CM | POA: Diagnosis not present

## 2023-03-28 DIAGNOSIS — Z8673 Personal history of transient ischemic attack (TIA), and cerebral infarction without residual deficits: Secondary | ICD-10-CM | POA: Diagnosis not present

## 2023-03-28 DIAGNOSIS — I480 Paroxysmal atrial fibrillation: Secondary | ICD-10-CM | POA: Diagnosis not present

## 2023-03-28 MED ORDER — MIRABEGRON ER 25 MG PO TB24
25.0000 mg | ORAL_TABLET | Freq: Every day | ORAL | Status: DC
  Administered 2023-03-28 – 2023-03-29 (×2): 25 mg via ORAL
  Filled 2023-03-28 (×2): qty 1

## 2023-03-28 MED ORDER — CHLORHEXIDINE GLUCONATE CLOTH 2 % EX PADS
6.0000 | MEDICATED_PAD | Freq: Every day | CUTANEOUS | Status: DC
  Administered 2023-03-28 – 2023-03-29 (×2): 6 via TOPICAL

## 2023-03-28 NOTE — Progress Notes (Signed)
Assessed patient chart to assist primary RN with medication administration

## 2023-03-28 NOTE — Progress Notes (Signed)
1 Day Post-Op Subjective: Patient reports discomfort from the foley. Patient having bladder spasms. Urine clear this morning on light drip CBI  Objective: Vital signs in last 24 hours: Temp:  [97.8 F (36.6 C)-99.4 F (37.4 C)] 99.1 F (37.3 C) (06/08 1306) Pulse Rate:  [71-81] 78 (06/08 1306) Resp:  [17-20] 20 (06/08 1306) BP: (113-163)/(83-99) 137/99 (06/08 1306) SpO2:  [92 %-96 %] 94 % (06/08 1306)  Intake/Output from previous day: 06/07 0701 - 06/08 0700 In: 23419.2 [P.O.:600; I.V.:2019.2; IV Piggyback:300] Out: 16109 [Urine:40100; Blood:200] Intake/Output this shift: Total I/O In: 1412.5 [P.O.:480; I.V.:932.5] Out: 2625 [Urine:2625]  Physical Exam:  General:alert, cooperative, and appears stated age GI: not done and soft, non tender, normal bowel sounds, no palpable masses, no organomegaly, no inguinal hernia Male genitalia: not done Extremities: extremities normal, atraumatic, no cyanosis or edema  Lab Results: No results for input(s): "HGB", "HCT" in the last 72 hours. BMET No results for input(s): "NA", "K", "CL", "CO2", "GLUCOSE", "BUN", "CREATININE", "CALCIUM" in the last 72 hours. No results for input(s): "LABPT", "INR" in the last 72 hours. No results for input(s): "LABURIN" in the last 72 hours. Results for orders placed or performed during the hospital encounter of 06/11/22  Aerobic/Anaerobic Culture w Gram Stain (surgical/deep wound)     Status: None   Collection Time: 06/11/22  9:53 AM   Specimen: PATH Other  Result Value Ref Range Status   Specimen Description   Final    TISSUE Performed at [facility], [address] [place], [place] [address]    Special Requests   Final    NONE PREVIOUSLY IMPLANTED ABDOMINAL MESH Performed at [facility], [address], [place], [place] [address]    Gram Stain NO WBC SEEN NO ORGANISMS SEEN   Final   Culture   Final    No growth aerobically or anaerobically. Performed at  [facility], [address], [place], [place] [address]    Report Status 06/16/2022 FINAL  Final    Studies/Results: No results found.  Assessment/Plan: POD#1 aquablation CBI discontinued. Patient ot be discharged home is urine remains clear   LOS: 0 days   Grant [name] 03/28/2023, 1:49 PM

## 2023-03-29 DIAGNOSIS — Z87891 Personal history of nicotine dependence: Secondary | ICD-10-CM | POA: Diagnosis not present

## 2023-03-29 DIAGNOSIS — E039 Hypothyroidism, unspecified: Secondary | ICD-10-CM | POA: Diagnosis not present

## 2023-03-29 DIAGNOSIS — N401 Enlarged prostate with lower urinary tract symptoms: Secondary | ICD-10-CM | POA: Diagnosis not present

## 2023-03-29 DIAGNOSIS — Z79899 Other long term (current) drug therapy: Secondary | ICD-10-CM | POA: Diagnosis not present

## 2023-03-29 DIAGNOSIS — R350 Frequency of micturition: Secondary | ICD-10-CM | POA: Diagnosis not present

## 2023-03-29 DIAGNOSIS — Z8673 Personal history of transient ischemic attack (TIA), and cerebral infarction without residual deficits: Secondary | ICD-10-CM | POA: Diagnosis not present

## 2023-03-29 DIAGNOSIS — Z8616 Personal history of COVID-19: Secondary | ICD-10-CM | POA: Diagnosis not present

## 2023-03-29 DIAGNOSIS — Z952 Presence of prosthetic heart valve: Secondary | ICD-10-CM | POA: Diagnosis not present

## 2023-03-29 DIAGNOSIS — Z7901 Long term (current) use of anticoagulants: Secondary | ICD-10-CM | POA: Diagnosis not present

## 2023-03-29 DIAGNOSIS — I480 Paroxysmal atrial fibrillation: Secondary | ICD-10-CM | POA: Diagnosis not present

## 2023-03-29 DIAGNOSIS — J45909 Unspecified asthma, uncomplicated: Secondary | ICD-10-CM | POA: Diagnosis not present

## 2023-03-29 DIAGNOSIS — I10 Essential (primary) hypertension: Secondary | ICD-10-CM | POA: Diagnosis not present

## 2023-03-29 MED ORDER — OXYCODONE HCL 5 MG PO TABS: 5.0000 mg | ORAL_TABLET | ORAL | 0 refills | Status: DC | PRN

## 2023-03-29 MED ORDER — OXYBUTYNIN CHLORIDE 5 MG PO TABS: 5.0000 mg | ORAL_TABLET | Freq: Three times a day (TID) | ORAL | 0 refills | Status: DC | PRN

## 2023-03-29 MED ORDER — RISPERIDONE 1 MG PO TBDP: 2.0000 mg | ORAL_TABLET | Freq: Three times a day (TID) | ORAL | Status: DC | PRN

## 2023-03-29 MED ORDER — ZIPRASIDONE MESYLATE 20 MG IM SOLR: 20.0000 mg | INTRAMUSCULAR | Status: DC | PRN

## 2023-03-29 MED ORDER — HYOSCYAMINE SULFATE 0.125 MG SL SUBL
0.1250 mg | SUBLINGUAL_TABLET | SUBLINGUAL | Status: DC | PRN
  Administered 2023-03-29: 0.125 mg via SUBLINGUAL
  Filled 2023-03-29: qty 1

## 2023-03-29 MED ORDER — LORAZEPAM 1 MG PO TABS: 1.0000 mg | ORAL_TABLET | ORAL | Status: DC | PRN

## 2023-03-29 NOTE — Plan of Care (Signed)
  Problem: Education: Goal: Knowledge of the prescribed therapeutic regimen will improve Outcome: Adequate for Discharge   Problem: Bowel/Gastric: Goal: Gastrointestinal status for postoperative course will improve Outcome: Adequate for Discharge   Problem: Health Behavior/Discharge Planning: Goal: Identification of resources available to assist in meeting health care needs will improve Outcome: Adequate for Discharge   Problem: Education: Goal: Knowledge of General Education information will improve Description: Including pain rating scale, medication(s)/side effects and non-pharmacologic comfort measures Outcome: Adequate for Discharge   Problem: Health Behavior/Discharge Planning: Goal: Ability to manage health-related needs will improve Outcome: Adequate for Discharge   Problem: Clinical Measurements: Goal: Ability to maintain clinical measurements within normal limits will improve Outcome: Adequate for Discharge Goal: Will remain free from infection Outcome: Adequate for Discharge Goal: Diagnostic test results will improve Outcome: Adequate for Discharge Goal: Respiratory complications will improve Outcome: Adequate for Discharge Goal: Cardiovascular complication will be avoided Outcome: Adequate for Discharge   Problem: Nutrition: Goal: Adequate nutrition will be maintained Outcome: Adequate for Discharge   Problem: Elimination: Goal: Will not experience complications related to bowel motility Outcome: Adequate for Discharge

## 2023-03-29 NOTE — Progress Notes (Signed)
Discharge instructions and educations given to the patient and family.Patient and his family verbalize understanding and doesn't have any questions.

## 2023-03-31 NOTE — Discharge Summary (Signed)
Physician Discharge Summary  Patient ID: Grant [name] MRN: [id number] DOB/AGE: 1951/05/12 72 y.o.  Admit date: 03/27/2023 Discharge date:03/29/2023  Admission Diagnoses:  BPH with obstruction/lower urinary tract symptoms  Discharge Diagnoses:  Principal Problem:   BPH with obstruction/lower urinary tract symptoms   Past Medical History:  Diagnosis Date   Allergic rhinitis    Anxiety    Arthritis    Asthma    Bladder tumor    Chronic fatigue    COVID-19 05/22/2021   Depression    06/30/17 Pt denies being depressed, reports Effexor is taken for Chronic Fatigue    Dyspnea    Enlarged prostate    Fibromyalgia    GERD (gastroesophageal reflux disease)    History of chronic bronchitis    History of migraine    light and sound sensitive   History of toxic encephalopathy    Hypothyroidism    OSA on CPAP    CPAP 14   PAF (paroxysmal atrial fibrillation) (HCC) CARDIOLOGIST -- DR Eden Emms   DX OCT 2013   Pneumonia 2023   S/P AVR (aortic valve replacement) and aortoplasty    Stroke [facility])    has post stroke chorea   Unspecified essential hypertension    Urethral tumor    PROSTATIC    Surgeries: Procedure(s): TRANSURETHRAL WATERJET ABLATION OF PROSTATE on 03/27/2023   Consultants (if any):   Discharged Condition: Improved  Hospital Course: Grant [name] is an 72 y.o. male who was admitted 03/27/2023 with a diagnosis of BPH with obstruction/lower urinary tract symptoms and went to the operating room on 03/27/2023 and underwent the above named procedures.    He was given perioperative antibiotics:  Anti-infectives (From admission, onward)    Start     Dose/Rate Route Frequency Ordered Stop   03/27/23 2200  nitrofurantoin (macrocrystal-monohydrate) (MACROBID) capsule 100 mg  Status:  Discontinued        100 mg Oral Daily at bedtime 03/27/23 1337 03/29/23 2114   03/27/23 0745  Ampicillin-Sulbactam (UNASYN) 3 g in sodium chloride 0.9 % 100 mL IVPB        3 g 200 mL/hr over 30  Minutes Intravenous  Once 03/27/23 0733 03/27/23 1410   03/27/23 0000  nitrofurantoin, macrocrystal-monohydrate, (MACROBID) 100 MG capsule        100 mg Oral Daily at bedtime 03/27/23 1157 04/10/23 2359   03/26/23 1238  ceFAZolin (ANCEF) IVPB 2g/100 mL premix  Status:  Discontinued        2 g 200 mL/hr over 30 Minutes Intravenous 30 min pre-op 03/26/23 1238 03/26/23 1238     .  He was given sequential compression devices, early ambulation for DVT prophylaxis.  He benefited maximally from the hospital stay and there were no complications.    Recent vital signs:  Vitals:   03/29/23 0446 03/29/23 1310  BP: (!) 141/93 136/74  Pulse: 86 62  Resp: 19 18  Temp: 98 F (36.7 C) 98 F (36.7 C)  SpO2: 93% 97%    Recent laboratory studies:  Lab Results  Component Value Date   HGB 16.6 03/12/2023   HGB 16.3 11/02/2022   HGB 16.8 09/06/2022   Lab Results  Component Value Date   WBC 7.0 03/12/2023   PLT 149 (L) 03/12/2023   Lab Results  Component Value Date   INR 1.3 (H) 09/06/2022   Lab Results  Component Value Date   NA 137 03/12/2023   K 3.9 03/12/2023   CL 105 03/12/2023   CO2 26  03/12/2023   BUN 10 03/12/2023   CREATININE 0.99 03/12/2023   GLUCOSE 87 03/12/2023    Discharge Medications:   Allergies as of 03/29/2023       Reactions   Ingrezza [valbenazine Tosylate] Other (See Comments)   PVC's   Perphenazine Swelling, Other (See Comments)   Tongue swelling   Amantadines    Feels bad   Amiodarone Swelling, Other (See Comments)   Tongue swelling   Codeine Itching   Prednisone Itching, Other (See Comments)   CAN TOLERATE DEPO. PREDNISONE BY MOUTH CAUSES MOOD CHANGES, INSOMNIA, AND ITCHING.    Tramadol Other (See Comments)   Delusions/hallucinations from high doses        Medication List     TAKE these medications    acetaminophen 500 MG tablet Commonly known as: TYLENOL Take 1,000 mg by mouth every 8 (eight) hours as needed for headache or moderate  pain.   Aimovig 140 MG/ML Soaj Generic drug: Erenumab-aooe Inject 140 mg into the skin every 28 (twenty-eight) days.   albuterol 108 (90 Base) MCG/ACT inhaler Commonly known as: Ventolin HFA INHALE 2 PUFFS INTO THE LUNGS EVERY 6 HOURS AS NEEDED FOR WHEEZING OR SHORTNESS OF BREATH   ALPRAZolam 0.25 MG tablet Commonly known as: XANAX Take 1 tablet (0.25 mg total) by mouth 3 (three) times daily as needed for anxiety. What changed: when to take this   aluminum hydroxide-magnesium carbonate 95-358 MG/15ML Susp Commonly known as: GAVISCON Take 15 mLs by mouth 3 (three) times daily as needed for indigestion or heartburn.   amoxicillin-clavulanate 875-125 MG tablet Commonly known as: AUGMENTIN Take 1 tablet by mouth 2 (two) times daily.   azelastine 0.1 % nasal spray Commonly known as: ASTELIN Place 1 spray into the nose 2 (two) times daily as needed for rhinitis or allergies.   benzonatate 200 MG capsule Commonly known as: TESSALON Take 200 mg by mouth 3 (three) times daily as needed for cough.   BLUE-EMU MAXIMUM STRENGTH EX Apply 1 application topically 3 (three) times daily as needed (arthritis pain.).   cetirizine 10 MG tablet Commonly known as: ZYRTEC Take 10 mg by mouth every morning.   Colcrys 0.6 MG tablet Generic drug: colchicine Take 0.6 mg by mouth every other day.   CoQ10 100 MG Caps Take 100 mg by mouth every evening.   Delsym 30 MG/5ML liquid Generic drug: dextromethorphan Take 30 mg by mouth daily as needed for cough.   diclofenac Sodium 1 % Gel Commonly known as: VOLTAREN Apply 2 g topically 4 (four) times daily as needed (to affected areas- for arthritis pain.).   diphenhydrAMINE 25 mg capsule Commonly known as: BENADRYL Take 50 mg by mouth at bedtime as needed for allergies.   Fiber 500 MG Caps Take 1,000 mg by mouth in the morning.   finasteride 5 MG tablet Commonly known as: PROSCAR Take 5 mg by mouth at bedtime.   ibuprofen 200 MG  tablet Commonly known as: ADVIL Take 400 mg by mouth 2 (two) times daily as needed for headache (or pain).   ipratropium 0.06 % nasal spray Commonly known as: ATROVENT Place 2 sprays into both nostrils 2 (two) times daily.   ipratropium-albuterol 0.5-2.5 (3) MG/3ML Soln Commonly known as: DUONEB Take 3 mLs by nebulization every 6 (six) hours as needed.   Iron 142 (45 Fe) MG Tbcr Take 142 mg by mouth in the morning and at bedtime.   levothyroxine 112 MCG tablet Commonly known as: SYNTHROID Take 112 mcg by mouth  daily before breakfast.   liothyronine 25 MCG tablet Commonly known as: CYTOMEL Take 25 mcg by mouth daily before breakfast.   losartan 50 MG tablet Commonly known as: COZAAR TAKE 1 TABLET BY MOUTH EVERY DAY   methocarbamol 750 MG tablet Commonly known as: ROBAXIN Take 1 tablet (750 mg total) by mouth 4 (four) times daily as needed (use for muscle cramps/pain).   metoprolol tartrate 50 MG tablet Commonly known as: LOPRESSOR TAKE 1 TABLET(50 MG) BY MOUTH TWICE DAILY   Mucinex Maximum Strength 1200 MG Tb12 Generic drug: Guaifenesin Take 1,200 mg by mouth 2 (two) times daily as needed (cough).   multivitamin with minerals Tabs tablet Take 1 tablet by mouth daily with breakfast.   naproxen sodium 220 MG tablet Commonly known as: ALEVE Take 220 mg by mouth 2 (two) times daily as needed (pain/headache).   nitrofurantoin (macrocrystal-monohydrate) 100 MG capsule Commonly known as: Macrobid Take 1 capsule (100 mg total) by mouth at bedtime for 14 days.   olopatadine 0.1 % ophthalmic solution Commonly known as: PATANOL Place 1 drop into both eyes 2 (two) times daily as needed for allergies.   omeprazole 40 MG capsule Commonly known as: PRILOSEC Take 1 capsule (40 mg total) by mouth 2 (two) times daily.   OVER THE COUNTER MEDICATION Take 1 capsule by mouth 2 (two) times daily. Liposomal supplement   oxybutynin 5 MG tablet Commonly known as: DITROPAN Take 1  tablet (5 mg total) by mouth every 8 (eight) hours as needed for bladder spasms.   oxyCODONE 5 MG immediate release tablet Commonly known as: Oxy IR/ROXICODONE Take 1 tablet (5 mg total) by mouth every 4 (four) hours as needed for moderate pain.   PRESCRIPTION MEDICATION See admin instructions. CPAP- At bedtime   rivaroxaban 20 MG Tabs tablet Commonly known as: Xarelto TAKE 1 TABLET(20 MG) BY MOUTH DAILY WITH SUPPER   rizatriptan 10 MG tablet Commonly known as: MAXALT TAKE 1 TABLET BY MOUTH AS NEEDED FOR MIGRAINE. MAY REPEAT IN 2 HOURS IF NEEDED   saccharomyces boulardii 250 MG capsule Commonly known as: FLORASTOR Take 250 mg by mouth every morning.   sodium chloride 0.65 % Soln nasal spray Commonly known as: OCEAN Place 1 spray into both nostrils 2 (two) times daily.   Symbicort 160-4.5 MCG/ACT inhaler Generic drug: budesonide-formoterol INHALE 2 PUFFS INTO THE LUNGS IN THE MORNING AND AT BEDTIME   tamsulosin 0.4 MG Caps capsule Commonly known as: FLOMAX Take 0.4 mg by mouth in the morning and at bedtime.   Testosterone Cypionate 200 MG/ML Soln Inject 200 mg into the muscle See admin instructions. Inject 200 mg (1 ml) intramuscularly once every 7-10 days   venlafaxine XR 75 MG 24 hr capsule Commonly known as: EFFEXOR-XR TAKE 1 CAPSULE(75 MG) BY MOUTH DAILY WITH BREAKFAST   zolpidem 10 MG tablet Commonly known as: AMBIEN Take 10 mg by mouth at bedtime as needed for sleep.        Diagnostic Studies: ECHOCARDIOGRAM COMPLETE  Result Date: 03/05/2023    ECHOCARDIOGRAM REPORT   Patient Name:   Grant [name] [facility] Date of Exam: 03/05/2023 Medical Rec #:  [id number]        Height:       70.0 in Accession #:    [id number]       Weight:       237.0 lb Date of Birth:  Mar 09, 1951        BSA:          2.243 m  Patient Age:    71 years         BP:           138/82 mmHg Patient Gender: M                HR:           68 bpm. Exam Location:  Outpatient Procedure: 2D Echo, Cardiac Doppler,  Color Doppler and Strain Analysis Indications:    S/P AVR (aortic valve replacement) [[id number]]  History:        Patient has prior history of Echocardiogram examinations, most                 recent 02/28/2022. Arrythmias:Atrial Fibrillation,                 Signs/Symptoms:Dyspnea; Risk Factors:Hypertension and GERD.                 Aortic Valve: 25 mm Edwards bioprosthetic valve is present in                 the aortic position. Procedure Date: 10/28/21.  Sonographer:    Eulah Pont RDCS Referring Phys: [id number] Zachary George SWINYER  Sonographer Comments: Global longitudinal strain was attempted. IMPRESSIONS  1. Left ventricular ejection fraction, by estimation, is 55 to 60%. The left ventricle has normal function. The left ventricle has no regional wall motion abnormalities. There is mild left ventricular hypertrophy. Left ventricular diastolic parameters were normal.  2. Right ventricular systolic function is normal. The right ventricular size is normal. There is moderately elevated pulmonary artery systolic pressure.  3. Left atrial size was moderately dilated.  4. The mitral valve is abnormal. Trivial mitral valve regurgitation. No evidence of mitral stenosis.  5. 25 mm Edwards bioprosthetic valve mild calcification of leaflets gradients stable since prior TTE cone 02/28/22 . The aortic valve has been repaired/replaced. Aortic valve regurgitation is not visualized. No aortic stenosis is present. There is a 25 mm Edwards bioprosthetic valve present in the aortic position. Procedure Date: 10/28/21.  6. Aortic dilatation noted. There is mild dilatation of the aortic root, measuring 38 mm.  7. The inferior vena cava is normal in size with greater than 50% respiratory variability, suggesting right atrial pressure of 3 mmHg. FINDINGS  Left Ventricle: Left ventricular ejection fraction, by estimation, is 55 to 60%. The left ventricle has normal function. The left ventricle has no regional wall motion abnormalities. The left  ventricular internal cavity size was normal in size. There is  mild left ventricular hypertrophy. Left ventricular diastolic parameters were normal. Right Ventricle: The right ventricular size is normal. No increase in right ventricular wall thickness. Right ventricular systolic function is normal. There is moderately elevated pulmonary artery systolic pressure. The tricuspid regurgitant velocity is 3.40 m/s, and with an assumed right atrial pressure of 3 mmHg, the estimated right ventricular systolic pressure is 49.2 mmHg. Left Atrium: Left atrial size was moderately dilated. Right Atrium: Right atrial size was normal in size. Pericardium: There is no evidence of pericardial effusion. Mitral Valve: The mitral valve is abnormal. There is mild thickening of the mitral valve leaflet(s). Mild mitral annular calcification. Trivial mitral valve regurgitation. No evidence of mitral valve stenosis. Tricuspid Valve: The tricuspid valve is normal in structure. Tricuspid valve regurgitation is mild . No evidence of tricuspid stenosis. Aortic Valve: 25 mm Edwards bioprosthetic valve mild calcification of leaflets gradients stable since prior TTE cone 02/28/22. The aortic valve has been repaired/replaced. Aortic valve regurgitation is  not visualized. No aortic stenosis is present. Aortic  valve mean gradient measures 26.0 mmHg. Aortic valve peak gradient measures 45.0 mmHg. Aortic valve area, by VTI measures 2.04 cm. There is a 25 mm Edwards bioprosthetic valve present in the aortic position. Procedure Date: 10/28/21. Pulmonic Valve: The pulmonic valve was normal in structure. Pulmonic valve regurgitation is not visualized. No evidence of pulmonic stenosis. Aorta: Aortic dilatation noted. There is mild dilatation of the aortic root, measuring 38 mm. Venous: The inferior vena cava is normal in size with greater than 50% respiratory variability, suggesting right atrial pressure of 3 mmHg. IAS/Shunts: No atrial level shunt detected  by color flow Doppler.  LEFT VENTRICLE PLAX 2D LVIDd:         4.90 cm      Diastology LVIDs:         3.80 cm      LV e' medial:    6.53 cm/s LV PW:         1.20 cm      LV E/e' medial:  16.5 LV IVS:        1.20 cm      LV e' lateral:   9.83 cm/s LVOT diam:     2.50 cm      LV E/e' lateral: 11.0 LV SV:         139 LV SV Index:   62 LVOT Area:     4.91 cm  LV Volumes (MOD) LV vol d, MOD A2C: 199.0 ml LV vol d, MOD A4C: 208.0 ml LV vol s, MOD A2C: 97.8 ml LV vol s, MOD A4C: 99.8 ml LV SV MOD A2C:     101.2 ml LV SV MOD A4C:     208.0 ml LV SV MOD BP:      107.4 ml RIGHT VENTRICLE RV S prime:     7.00 cm/s TAPSE (M-mode): 1.8 cm LEFT ATRIUM             Index        RIGHT ATRIUM           Index LA diam:        4.60 cm 2.05 cm/m   RA Area:     23.60 cm LA Vol (A2C):   64.4 ml 28.71 ml/m  RA Volume:   64.50 ml  28.75 ml/m LA Vol (A4C):   68.6 ml 30.58 ml/m LA Biplane Vol: 67.3 ml 30.00 ml/m  AORTIC VALVE AV Area (Vmax):    1.84 cm AV Area (Vmean):   1.79 cm AV Area (VTI):     2.04 cm AV Vmax:           335.33 cm/s AV Vmean:          241.667 cm/s AV VTI:            0.684 m AV Peak Grad:      45.0 mmHg AV Mean Grad:      26.0 mmHg LVOT Vmax:         126.00 cm/s LVOT Vmean:        88.300 cm/s LVOT VTI:          0.284 m LVOT/AV VTI ratio: 0.42  AORTA Ao Root diam: 3.80 cm Ao Asc diam:  3.20 cm MITRAL VALVE                TRICUSPID VALVE MV Area (PHT): 4.21 cm     TR Peak grad:   46.2 mmHg MV Decel Time: 180 msec  TR Vmax:        340.00 cm/s MV E velocity: 108.00 cm/s MV A velocity: 51.40 cm/s   SHUNTS MV E/A ratio:  2.10         Systemic VTI:  0.28 m                             Systemic Diam: 2.50 cm Charlton Haws MD Electronically signed by Charlton Haws MD Signature Date/Time: 03/05/2023/12:41:15 PM    Final     Disposition: Discharge disposition: 01-Home or Self Care          Follow-up Information     Jerilee Field, MD Follow up on 04/03/2023.   Specialty: Urology Why: at 9:15 AM with NP  Asa Saunas information: [address] [place] [address] [phone]                  Signed: Wilkie Aye 03/31/2023, 9:51 AM

## 2023-04-03 DIAGNOSIS — R350 Frequency of micturition: Secondary | ICD-10-CM | POA: Diagnosis not present

## 2023-04-03 DIAGNOSIS — N401 Enlarged prostate with lower urinary tract symptoms: Secondary | ICD-10-CM | POA: Diagnosis not present

## 2023-04-03 DIAGNOSIS — R351 Nocturia: Secondary | ICD-10-CM | POA: Diagnosis not present

## 2023-04-03 DIAGNOSIS — R3912 Poor urinary stream: Secondary | ICD-10-CM | POA: Diagnosis not present

## 2023-04-06 ENCOUNTER — Encounter: Payer: Self-pay | Admitting: Cardiovascular Disease

## 2023-04-09 DIAGNOSIS — R351 Nocturia: Secondary | ICD-10-CM | POA: Diagnosis not present

## 2023-04-09 DIAGNOSIS — R310 Gross hematuria: Secondary | ICD-10-CM | POA: Diagnosis not present

## 2023-04-09 DIAGNOSIS — R311 Benign essential microscopic hematuria: Secondary | ICD-10-CM | POA: Diagnosis not present

## 2023-04-09 DIAGNOSIS — N401 Enlarged prostate with lower urinary tract symptoms: Secondary | ICD-10-CM | POA: Diagnosis not present

## 2023-04-21 DIAGNOSIS — G4733 Obstructive sleep apnea (adult) (pediatric): Secondary | ICD-10-CM | POA: Diagnosis not present

## 2023-05-22 DIAGNOSIS — G4733 Obstructive sleep apnea (adult) (pediatric): Secondary | ICD-10-CM | POA: Diagnosis not present

## 2023-05-25 ENCOUNTER — Ambulatory Visit: Payer: Medicare Other | Admitting: Cardiothoracic Surgery

## 2023-05-25 ENCOUNTER — Other Ambulatory Visit: Payer: Medicare Other

## 2023-05-26 DIAGNOSIS — R3912 Poor urinary stream: Secondary | ICD-10-CM | POA: Diagnosis not present

## 2023-05-26 DIAGNOSIS — R351 Nocturia: Secondary | ICD-10-CM | POA: Diagnosis not present

## 2023-05-26 DIAGNOSIS — R350 Frequency of micturition: Secondary | ICD-10-CM | POA: Diagnosis not present

## 2023-05-26 DIAGNOSIS — N401 Enlarged prostate with lower urinary tract symptoms: Secondary | ICD-10-CM | POA: Diagnosis not present

## 2023-05-28 ENCOUNTER — Other Ambulatory Visit: Payer: Medicare Other

## 2023-05-28 DIAGNOSIS — J45991 Cough variant asthma: Secondary | ICD-10-CM | POA: Diagnosis not present

## 2023-05-28 DIAGNOSIS — J4541 Moderate persistent asthma with (acute) exacerbation: Secondary | ICD-10-CM | POA: Diagnosis not present

## 2023-06-01 ENCOUNTER — Ambulatory Visit: Payer: Medicare Other | Admitting: Cardiothoracic Surgery

## 2023-06-01 ENCOUNTER — Encounter: Payer: Self-pay | Admitting: Cardiothoracic Surgery

## 2023-06-01 ENCOUNTER — Ambulatory Visit
Admission: RE | Admit: 2023-06-01 | Discharge: 2023-06-01 | Disposition: A | Discharge status: Home / Self Care | Payer: Medicare Other | Source: Ambulatory Visit | Attending: Cardiothoracic Surgery | Admitting: Cardiothoracic Surgery

## 2023-06-01 VITALS — BP 168/87 | HR 62 | Resp 20 | Ht 70.0 in | Wt 233.0 lb

## 2023-06-01 DIAGNOSIS — Z952 Presence of prosthetic heart valve: Secondary | ICD-10-CM

## 2023-06-01 DIAGNOSIS — Z8679 Personal history of other diseases of the circulatory system: Secondary | ICD-10-CM

## 2023-06-01 DIAGNOSIS — Z9889 Other specified postprocedural states: Secondary | ICD-10-CM | POA: Diagnosis not present

## 2023-06-01 DIAGNOSIS — R911 Solitary pulmonary nodule: Secondary | ICD-10-CM | POA: Diagnosis not present

## 2023-06-01 MED ORDER — IOPAMIDOL (ISOVUE-370) INJECTION 76%
200.0000 mL | Freq: Once | INTRAVENOUS | Status: AC | PRN
  Administered 2023-06-01: 75 mL via INTRAVENOUS

## 2023-06-01 NOTE — Progress Notes (Signed)
HPI: Patient examined, images of today's CTA as well as the echocardiogram May 2024 personally reviewed and discussed with patient. He is now 6 years after a biologic-Bentall procedure and doing well.  He has no current cardiac complaints.  He has known sternal wire fractures but has no complaints about chest wall pain and on exam the chest wall is scarred in and stable.  His LV function by echo is normal and he has no evidence of AI.  The aortic graft is intact and there is no false aneurysm.  The arch and descending thoracic aorta remain normal size.  He has significant tardive dyskinesia/chorea and is followed by neurology.  These are felt to be the result of previous lacunar stroke.  He recently had a transurethral procedure for BPH with improvement in his symptoms.   Current Outpatient Medications  Medication Sig Dispense Refill   acetaminophen (TYLENOL) 500 MG tablet Take 1,000 mg by mouth every 8 (eight) hours as needed for headache or moderate pain.     albuterol (VENTOLIN HFA) 108 (90 Base) MCG/ACT inhaler INHALE 2 PUFFS INTO THE LUNGS EVERY 6 HOURS AS NEEDED FOR WHEEZING OR SHORTNESS OF BREATH 18 g 12   ALPRAZolam (XANAX) 0.25 MG tablet Take 1 tablet (0.25 mg total) by mouth 3 (three) times daily as needed for anxiety. (Patient taking differently: Take 0.25 mg by mouth 2 (two) times daily as needed for anxiety.) 15 tablet 0   aluminum hydroxide-magnesium carbonate (GAVISCON) 95-358 MG/15ML SUSP Take 15 mLs by mouth 3 (three) times daily as needed for indigestion or heartburn.     azelastine (ASTELIN) 137 MCG/SPRAY nasal spray Place 1 spray into the nose 2 (two) times daily as needed for rhinitis or allergies.     benzonatate (TESSALON) 200 MG capsule Take 200 mg by mouth 3 (three) times daily as needed for cough.     budesonide-formoterol (SYMBICORT) 160-4.5 MCG/ACT inhaler INHALE 2 PUFFS INTO THE LUNGS IN THE MORNING AND AT BEDTIME 10.2 g 12   cetirizine (ZYRTEC) 10 MG tablet Take 10 mg  by mouth every morning.     colchicine (COLCRYS) 0.6 MG tablet Take 0.6 mg by mouth every other day.     dextromethorphan (DELSYM) 30 MG/5ML liquid Take 30 mg by mouth daily as needed for cough.     diclofenac Sodium (VOLTAREN) 1 % GEL Apply 2 g topically 4 (four) times daily as needed (to affected areas- for arthritis pain.).     diphenhydrAMINE (BENADRYL) 25 mg capsule Take 50 mg by mouth at bedtime as needed for allergies.     Erenumab-aooe (AIMOVIG) 140 MG/ML SOAJ Inject 140 mg into the skin every 28 (twenty-eight) days. 1 mL 11   Ferrous Sulfate (IRON) 142 (45 Fe) MG TBCR Take 142 mg by mouth in the morning and at bedtime.     Fiber 500 MG CAPS Take 1,000 mg by mouth in the morning.     finasteride (PROSCAR) 5 MG tablet Take 5 mg by mouth at bedtime.      Guaifenesin (MUCINEX MAXIMUM STRENGTH) 1200 MG TB12 Take 1,200 mg by mouth 2 (two) times daily as needed (cough).     ibuprofen (ADVIL) 200 MG tablet Take 400 mg by mouth 2 (two) times daily as needed for headache (or pain).     ipratropium (ATROVENT) 0.06 % nasal spray Place 2 sprays into both nostrils 2 (two) times daily.     ipratropium-albuterol (DUONEB) 0.5-2.5 (3) MG/3ML SOLN Take 3 mLs by nebulization every 6 (six)  hours as needed. 360 mL 3   levothyroxine (SYNTHROID, LEVOTHROID) 112 MCG tablet Take 112 mcg by mouth daily before breakfast.      liothyronine (CYTOMEL) 25 MCG tablet Take 25 mcg by mouth daily before breakfast.     losartan (COZAAR) 50 MG tablet TAKE 1 TABLET BY MOUTH EVERY DAY 30 tablet 6   Menthol, Topical Analgesic, (BLUE-EMU MAXIMUM STRENGTH EX) Apply 1 application topically 3 (three) times daily as needed (arthritis pain.).     methocarbamol (ROBAXIN) 750 MG tablet Take 1 tablet (750 mg total) by mouth 4 (four) times daily as needed (use for muscle cramps/pain). 30 tablet 2   metoprolol tartrate (LOPRESSOR) 50 MG tablet TAKE 1 TABLET(50 MG) BY MOUTH TWICE DAILY 180 tablet 3   Multiple Vitamin (MULTIVITAMIN WITH  MINERALS) TABS tablet Take 1 tablet by mouth daily with breakfast.     naproxen sodium (ALEVE) 220 MG tablet Take 220 mg by mouth 2 (two) times daily as needed (pain/headache).     olopatadine (PATANOL) 0.1 % ophthalmic solution Place 1 drop into both eyes 2 (two) times daily as needed for allergies.     omeprazole (PRILOSEC) 40 MG capsule Take 1 capsule (40 mg total) by mouth 2 (two) times daily. 180 capsule 1   PRESCRIPTION MEDICATION See admin instructions. CPAP- At bedtime     rivaroxaban (XARELTO) 20 MG TABS tablet TAKE 1 TABLET(20 MG) BY MOUTH DAILY WITH SUPPER 90 tablet 3   rizatriptan (MAXALT) 10 MG tablet TAKE 1 TABLET BY MOUTH AS NEEDED FOR MIGRAINE. MAY REPEAT IN 2 HOURS IF NEEDED 30 tablet 11   saccharomyces boulardii (FLORASTOR) 250 MG capsule Take 250 mg by mouth every morning.     sodium chloride (OCEAN) 0.65 % SOLN nasal spray Place 1 spray into both nostrils 2 (two) times daily.     Testosterone Cypionate 200 MG/ML SOLN Inject 200 mg into the muscle See admin instructions. Inject 200 mg (1 ml) intramuscularly once every 7-10 days     venlafaxine XR (EFFEXOR-XR) 75 MG 24 hr capsule TAKE 1 CAPSULE(75 MG) BY MOUTH DAILY WITH BREAKFAST 90 capsule 1   zolpidem (AMBIEN) 10 MG tablet Take 10 mg by mouth at bedtime as needed for sleep.     amoxicillin-clavulanate (AUGMENTIN) 875-125 MG tablet Take 1 tablet by mouth 2 (two) times daily.     Coenzyme Q10 (COQ10) 100 MG CAPS Take 100 mg by mouth every evening.     OVER THE COUNTER MEDICATION Take 1 capsule by mouth 2 (two) times daily. Liposomal supplement     oxybutynin (DITROPAN) 5 MG tablet Take 1 tablet (5 mg total) by mouth every 8 (eight) hours as needed for bladder spasms. 30 tablet 0   oxyCODONE (OXY IR/ROXICODONE) 5 MG immediate release tablet Take 1 tablet (5 mg total) by mouth every 4 (four) hours as needed for moderate pain. 30 tablet 0   tamsulosin (FLOMAX) 0.4 MG CAPS capsule Take 0.4 mg by mouth in the morning and at bedtime.      Current Facility-Administered Medications  Medication Dose Route Frequency Provider Last Rate Last Admin   methylPREDNISolone acetate (DEPO-MEDROL) injection (RADIOLOGY ONLY) 120 mg  120 mg Intramuscular Once Omar Person, MD         Physical Exam: Blood pressure (!) 168/87, pulse 62, resp. rate 20, height 5\' 10"  (1.778 m), weight 233 lb (105.7 kg), SpO2 95%.  [He did not take his blood pressure pills this morning because he was n.p.o. for the CTA].  Exam    General- alert and comfortable, very restless with constant movement of his extremities.    Neck- no JVD, no cervical adenopathy palpable, no carotid bruit   Lungs- clear without rales, wheezes.  Chest wall is stable without clicking or popping.   Cor- regular rate and rhythm, soft flow murmur through the aortic prosthesis without, gallop   Abdomen- soft, non-tender   Extremities - warm, non-tender, minimal edema   Neuro- oriented, appropriate, choreiform movement of extremities  Diagnostic Tests: CTA today shows intact ascending aortic repair without residual disease of the remaining native thoracic aorta.. After 6 years following surgery I do not feel that repeated CTAs will be needed and we can transition to CT scan without contrast every 18 months to follow the thoracic aorta. Impression: History of biologic Bentall procedure 2018 with intact biologic AVR with a 25 mm Edwards valve and intact ascending thoracic aortic replacement.  Plan: Return in 18 months for CT chest without contrast for surveillance of the thoracic aortic repair.  A left upper lobe 7 mm subsolid density in the major fissure was noted on the recent scan and follow-up CT scan was recommended in 6 months.  It does not appear to be at risk to my assessment.  She this information will be relayed to his primary care physician Dr. Tiburcio Pea.  . CTA 06-01-23 FINDINGS: Cardiovascular: Postsurgical change consistent aortic valve replacement. Some motion  degradation at the ascending aorta related to cardiac motion on nongated exam.   There is no aneurysmal dilatation of the ascending aorta which measures 30 mm diameter. Great vessels normal. Transverse arch and descending thoracic aorta normal caliber.   No pericardial effusion.   Mediastinum/Nodes: No axillary or supraclavicular adenopathy. No mediastinal or hilar adenopathy. No pericardial fluid. Esophagus normal.   Lungs/Pleura: Part solid nodule in the LEFT upper lobe measures 7 mm (image 68/7). Nodule in the RIGHT upper lobe measures 2 mm image 57/7)   Upper Abdomen: Limited view of the liver, kidneys, pancreas are unremarkable. Normal adrenal glands. Click   Musculoskeletal: No aggressive osseous lesion.   Review of the MIP images confirms the above findings.   IMPRESSION: 1. No evidence of thoracic aortic aneurysm. 2. Postsurgical change consistent aortic valve replacement. 3. Left part-solid pulmonary nodule   Lovett Sox, MD [facility] [phone]

## 2023-06-01 NOTE — Progress Notes (Signed)
The

## 2023-06-22 DIAGNOSIS — G4733 Obstructive sleep apnea (adult) (pediatric): Secondary | ICD-10-CM | POA: Diagnosis not present

## 2023-06-23 NOTE — Progress Notes (Signed)
History of Present Illness: . male former smoker with asthma/chronic bronchitis and allergic rhinitis, OSA PAF/ anticoagulation, aortic valve replacement, toxic encephalopathy, light sensitivity, chronic fatigue/fibromyalgia NPSG 03/24/03- AHI 88/ hr, desaturation to 75%, body weight 244 lbs PFT 06/01/17-WNL-FVC 4.46/96%, FEV1 3.71/106%, ratio 0.83, FEF 25-75% 4.64/167%, no response to dilator, TLC 107%, DLCO 101%  PFT 07/07/22- ------------------------------------------------------------------   12/23/22-  72 year old male former smoker (13.5 pkyrs) followed for Asthma/chronic Bronchitis and allergic Rhinitis,  OSA,  complicated by PAF/ Xarelto, Aortic Valve Replacement,/ IE abscess, Toxic Encephalopathy, Light sensitivity, Chronic Fatigue/fibromyalgia, CVA, Post-stroke chorea,  -Flutter valve, Symbicort 160, Astelin nasal, tessalon, singulair, Ventolin hfa,  CPAP   16/Adapt   AirSense 10 AutoSet    - replacement and change to auto5-15 ordered 03/04/22 Download compliance- 100%, AHI 3.8/ hr Body weight today-234 lbs Tooth pain led to dental imaging which identified sinusitis.  After 2 rounds of Augmentin, per wife, update CT showed resolution with clear sinuses and small retention cyst. Dry upper airway pattern cough for which he had seen Dr. Sherene Sires.  Archie Balboa Rancher candies began to choke him so he has stopped that.  Sips of water helps.  He has Tessalon. Neurologist have determined he had a small CVA at some point.  They think his constant leg movements represent "post-stroke chorea".  I do not know if some of his cough might be part of a tic related to that.  06/25/23-72 year old male former smoker (13.5 pkyrs) followed for Asthma/chronic Bronchitis and allergic Rhinitis,  OSA, Lung Nodule,  complicated by PAF/ Xarelto, Aortic Valve Replacement,/ IE abscess, Toxic Encephalopathy, Light sensitivity, Chronic Fatigue/fibromyalgia, CVA, Post-stroke chorea,  -Flutter valve, Symbicort 160, Astelin nasal,  tessalon, singulair, Ventolin hfa,  CPAP   14/Adapt   AirSense 10 AutoSet    - replacement and change to auto5-15 ordered 03/04/22 Download compliance- 100%, AHI 7/hr Body weight today-237 lbs Hosp [facility]- BPH/ urinary obstruction -----Patient states he is not been doing well.   Wife here -----Complains of cough, fatigue and chest congestion Report 3 different antibiotics since June. Levaquin>>Malaise.  Augmentin, levaqin then cefuroxime. Coughing, can't get his breath, throat clearing. Worse past 2 weeks. Sputum may be colored. Rests and breathes better on CPAP, so going on it early. Covid test neg. Wife reports some sort of respiratory distress on and off since June. [facility] test neg on 8/13 for Covid-RSV-Flu. More cough and more productive last 2 weeks, throat clearing, can't catch breath. Helps to go onto his CPAP. No fever, some cold sweats. Trying probiotic to help GI from antibiotics. Avoids cough drops- afraid he will inhale them. Augmentin may have helped most. CTachest 06/01/23- IMPRESSION: 1. No evidence of thoracic aortic aneurysm. 2. Postsurgical change consistent aortic valve replacement. 3. Left part-solid pulmonary nodule within the upper lobe measuring 7 mm. Per Fleischner Society Guidelines, recommend a non-contrast Chest CT at 3-6 months to confirm persistence. If unchanged and the solid component remains < 6 mm, an annual non-contrast Chest CT should be performed for 5 years. If the solid component is 6-8 mm on follow-up, recommend biopsy or resection. If the solid component is > 8 mm on follow-up, recommend PET/CT, biopsy or resection. These guidelines do not apply to immunocompromised patients and patients with cancer. Follow up in patients with significant comorbidities as clinically warranted. For lung cancer screening, adhere to Lung-RADS guidelines. Reference: Radiology. 2017; 284(1):228-43.  ROS-see HPI     + = positive Constitutional:   No-   weight loss, night  sweats, fevers, chills, fatigue,  lassitude. HEENT:   No-  headaches, difficulty swallowing, tooth/dental problems,  sore throat,       No-  sneezing, itching, ear ache,                    No-nasal congestion, + post nasal drip,  CV:  +chest pain, orthopnea, PND, swelling in lower extremities, anasarca,  dizziness, palpitations Resp: No- coughing up of blood.               change in color of mucus.  No- wheezing.   Skin: Clear GI:   heartburn, indigestion, abdominal pain, nausea, vomiting,  GU:  MS:  No-   joint pain or swelling.   Neuro-     nothing unusual Psych:  No- change in mood or affect. + depression or anxiety.  No memory loss.  OBJ- Physical Exam Sunglasses, fidgeting/ restless General- Alert, Oriented, Affect-appropriate, Distress- none acute. +fidgeting, restless, sunglasses Skin- +eczematoid patches on lower calves, nonspecific, excoriated Lymphadenopathy- none Head- atraumatic. Stares aimlessly around room like a blind person.             Eyes- Gross vision intact, PERRLA, conjunctivae and secretions clear,  + sunglasses            Ears- Hearing, canals-normal            Nose- Clear, no-Septal dev, mucus, polyps, erosion, perforation             Throat- Mallampati II-III, mucosa , drainage- none, tonsils- atrophic Neck- flexible , trachea midline, no stridor , thyroid nl, carotid no bruit Chest - symmetrical excursion , unlabored           Heart/CV- RRR ,  murmur+2-3/6 S, no gallop  , no rub, nl s1 s2,                            - JVD- none , edema- none, stasis changes- none, varices- none           Lung- + clear/ unlabored, wheeze- none, cough+dry, dullness-none, rub- none           Chest wall-  Abd-  Br/ Gen/ Rectal- Not done, not indicated Extrem- cyanosis- none, clubbing, none, atrophy- none, strength- nl Neuro- +constant movement of legs/ chorea

## 2023-06-25 ENCOUNTER — Ambulatory Visit (INDEPENDENT_AMBULATORY_CARE_PROVIDER_SITE_OTHER): Payer: Medicare Other

## 2023-06-25 ENCOUNTER — Ambulatory Visit: Payer: Medicare Other | Admitting: Internal Medicine

## 2023-06-25 ENCOUNTER — Encounter: Payer: Self-pay | Admitting: Internal Medicine

## 2023-06-25 VITALS — HR 83 | Ht 70.0 in | Wt 237.6 lb

## 2023-06-25 DIAGNOSIS — J45991 Cough variant asthma: Secondary | ICD-10-CM | POA: Diagnosis not present

## 2023-06-25 DIAGNOSIS — G4733 Obstructive sleep apnea (adult) (pediatric): Secondary | ICD-10-CM

## 2023-06-25 DIAGNOSIS — J42 Unspecified chronic bronchitis: Secondary | ICD-10-CM

## 2023-06-25 DIAGNOSIS — J40 Bronchitis, not specified as acute or chronic: Secondary | ICD-10-CM | POA: Diagnosis not present

## 2023-06-25 DIAGNOSIS — R911 Solitary pulmonary nodule: Secondary | ICD-10-CM | POA: Diagnosis not present

## 2023-06-25 DIAGNOSIS — J209 Acute bronchitis, unspecified: Secondary | ICD-10-CM

## 2023-06-25 DIAGNOSIS — K219 Gastro-esophageal reflux disease without esophagitis: Secondary | ICD-10-CM

## 2023-06-25 LAB — CBC WITH DIFFERENTIAL/PLATELET
Basophils Absolute: 0 10*3/uL (ref 0.0–0.1)
Basophils Relative: 0.3 % (ref 0.0–3.0)
Eosinophils Absolute: 0.1 10*3/uL (ref 0.0–0.7)
Eosinophils Relative: 1.7 % (ref 0.0–5.0)
HCT: 50.4 % (ref 39.0–52.0)
Hemoglobin: 16.5 g/dL (ref 13.0–17.0)
Lymphocytes Relative: 10.4 % — ABNORMAL LOW (ref 12.0–46.0)
Lymphs Abs: 0.9 10*3/uL (ref 0.7–4.0)
MCHC: 32.8 g/dL (ref 30.0–36.0)
MCV: 98.4 fl (ref 78.0–100.0)
Monocytes Absolute: 1.1 10*3/uL — ABNORMAL HIGH (ref 0.1–1.0)
Monocytes Relative: 13.5 % — ABNORMAL HIGH (ref 3.0–12.0)
Neutro Abs: 6.1 10*3/uL (ref 1.4–7.7)
Neutrophils Relative %: 74.1 % (ref 43.0–77.0)
Platelets: 142 10*3/uL — ABNORMAL LOW (ref 150.0–400.0)
RBC: 5.13 Mil/uL (ref 4.22–5.81)
RDW: 14.4 % (ref 11.5–15.5)
WBC: 8.2 10*3/uL (ref 4.0–10.5)

## 2023-06-25 MED ORDER — CEFDINIR 300 MG PO CAPS: ORAL_CAPSULE | ORAL | 0 refills | Status: DC

## 2023-06-25 NOTE — Patient Instructions (Signed)
Order- CXR--   exacerbation chronic bronchitis  Order- lab- CBC w diff     exacerb chronic bronchitis  Order- future CT chest no contrast   in 4 months    dx lung nodule  Script sent for cefdinir antibiotic  Try sips of liquids and anything you can make work like throat lozenges to soothe your throat

## 2023-06-27 ENCOUNTER — Emergency Department (HOSPITAL_COMMUNITY)
Admission: EM | Admit: 2023-06-27 | Discharge: 2023-06-27 | Disposition: A | Discharge status: Home / Self Care | Payer: Medicare Other | Attending: Emergency Medicine | Admitting: Emergency Medicine

## 2023-06-27 ENCOUNTER — Encounter (HOSPITAL_COMMUNITY): Payer: Self-pay

## 2023-06-27 ENCOUNTER — Emergency Department (HOSPITAL_COMMUNITY): Payer: Medicare Other

## 2023-06-27 ENCOUNTER — Other Ambulatory Visit: Payer: Self-pay

## 2023-06-27 DIAGNOSIS — R0989 Other specified symptoms and signs involving the circulatory and respiratory systems: Secondary | ICD-10-CM | POA: Diagnosis not present

## 2023-06-27 DIAGNOSIS — I517 Cardiomegaly: Secondary | ICD-10-CM | POA: Diagnosis not present

## 2023-06-27 DIAGNOSIS — R0789 Other chest pain: Secondary | ICD-10-CM | POA: Diagnosis not present

## 2023-06-27 DIAGNOSIS — R918 Other nonspecific abnormal finding of lung field: Secondary | ICD-10-CM | POA: Diagnosis not present

## 2023-06-27 DIAGNOSIS — R0600 Dyspnea, unspecified: Secondary | ICD-10-CM | POA: Insufficient documentation

## 2023-06-27 DIAGNOSIS — R0602 Shortness of breath: Secondary | ICD-10-CM | POA: Diagnosis not present

## 2023-06-27 DIAGNOSIS — R059 Cough, unspecified: Secondary | ICD-10-CM | POA: Diagnosis not present

## 2023-06-27 LAB — CBC WITH DIFFERENTIAL/PLATELET
Abs Immature Granulocytes: 0.06 10*3/uL (ref 0.00–0.07)
Basophils Absolute: 0.1 10*3/uL (ref 0.0–0.1)
Basophils Relative: 1 %
Eosinophils Absolute: 0.2 10*3/uL (ref 0.0–0.5)
Eosinophils Relative: 2 %
HCT: 49.3 % (ref 39.0–52.0)
Hemoglobin: 16 g/dL (ref 13.0–17.0)
Immature Granulocytes: 1 %
Lymphocytes Relative: 11 %
Lymphs Abs: 0.9 10*3/uL (ref 0.7–4.0)
MCH: 32.6 pg (ref 26.0–34.0)
MCHC: 32.5 g/dL (ref 30.0–36.0)
MCV: 100.4 fL — ABNORMAL HIGH (ref 80.0–100.0)
Monocytes Absolute: 1 10*3/uL (ref 0.1–1.0)
Monocytes Relative: 12 %
Neutro Abs: 6.2 10*3/uL (ref 1.7–7.7)
Neutrophils Relative %: 73 %
Platelets: 124 10*3/uL — ABNORMAL LOW (ref 150–400)
RBC: 4.91 MIL/uL (ref 4.22–5.81)
RDW: 13.6 % (ref 11.5–15.5)
WBC: 8.5 10*3/uL (ref 4.0–10.5)
nRBC: 0 % (ref 0.0–0.2)

## 2023-06-27 LAB — COMPREHENSIVE METABOLIC PANEL
ALT: 31 U/L (ref 0–44)
AST: 27 U/L (ref 15–41)
Albumin: 3.4 g/dL — ABNORMAL LOW (ref 3.5–5.0)
Alkaline Phosphatase: 53 U/L (ref 38–126)
Anion gap: 6 (ref 5–15)
BUN: 12 mg/dL (ref 8–23)
CO2: 28 mmol/L (ref 22–32)
Calcium: 8.6 mg/dL — ABNORMAL LOW (ref 8.9–10.3)
Chloride: 104 mmol/L (ref 98–111)
Creatinine, Ser: 1.04 mg/dL (ref 0.61–1.24)
GFR, Estimated: >60 mL/min (ref >60)
Glucose, Bld: 100 mg/dL — ABNORMAL HIGH (ref 70–99)
Potassium: 3.7 mmol/L (ref 3.5–5.1)
Sodium: 138 mmol/L (ref 135–145)
Total Bilirubin: 0.5 mg/dL (ref 0.3–1.2)
Total Protein: 6 g/dL — ABNORMAL LOW (ref 6.5–8.1)

## 2023-06-27 LAB — BRAIN NATRIURETIC PEPTIDE: B Natriuretic Peptide: 512.4 pg/mL — ABNORMAL HIGH (ref 0.0–100.0)

## 2023-06-27 LAB — TROPONIN I (HIGH SENSITIVITY)
Troponin I (High Sensitivity): 15 ng/L (ref <18)
Troponin I (High Sensitivity): 16 ng/L (ref <18)

## 2023-06-27 MED ORDER — FUROSEMIDE 40 MG PO TABS
20.0000 mg | ORAL_TABLET | Freq: Once | ORAL | Status: AC
  Administered 2023-06-27: 20 mg via ORAL
  Filled 2023-06-27: qty 1

## 2023-06-27 MED ORDER — ALPRAZOLAM 0.5 MG PO TABS
0.5000 mg | ORAL_TABLET | Freq: Once | ORAL | Status: AC
  Administered 2023-06-27: 0.5 mg via ORAL
  Filled 2023-06-27: qty 1

## 2023-06-27 MED ORDER — FUROSEMIDE 10 MG/ML IJ SOLN
20.0000 mg | Freq: Once | INTRAMUSCULAR | Status: AC
  Administered 2023-06-27: 20 mg via INTRAVENOUS
  Filled 2023-06-27: qty 4

## 2023-06-27 MED ORDER — FUROSEMIDE 20 MG PO TABS: 20.0000 mg | ORAL_TABLET | Freq: Every day | ORAL | 0 refills | Status: DC

## 2023-06-27 NOTE — ED Provider Notes (Signed)
[place] [facility]GENCY DEPARTMENT AT [facility] Provider Note   CSN: [id number] Arrival date & time: 06/27/23  1436     History {Add pertinent medical, surgical, social history, OB history to HPI:1} Chief Complaint  Patient presents with   Shortness of Breath    Grant [name] is a 72 y.o. male.  Patient complains of shortness of breath.   Shortness of Breath      Home Medications Prior to Admission medications   Medication Sig Start Date End Date Taking? Authorizing Provider  furosemide (LASIX) 20 MG tablet Take 1 tablet (20 mg total) by mouth daily. 06/27/23  Yes Bethann Berkshire, MD  acetaminophen (TYLENOL) 500 MG tablet Take 1,000 mg by mouth every 8 (eight) hours as needed for headache or moderate pain.    [provider]  albuterol (VENTOLIN HFA) 108 (90 Base) MCG/ACT inhaler INHALE 2 PUFFS INTO THE LUNGS EVERY 6 HOURS AS NEEDED FOR WHEEZING OR SHORTNESS OF BREATH 07/07/22   Young, Rennis Chris, MD  ALPRAZolam Prudy Feeler) 0.25 MG tablet Take 1 tablet (0.25 mg total) by mouth 3 (three) times daily as needed for anxiety. Patient taking differently: Take 0.25 mg by mouth 2 (two) times daily as needed for anxiety. 10/13/20   Glade Lloyd, MD  aluminum hydroxide-magnesium carbonate (GAVISCON) 95-358 MG/15ML SUSP Take 15 mLs by mouth 3 (three) times daily as needed for indigestion or heartburn.    [provider]  amoxicillin-clavulanate (AUGMENTIN) 875-125 MG tablet Take 1 tablet by mouth 2 (two) times daily. 03/05/23   [provider]  azelastine (ASTELIN) 137 MCG/SPRAY nasal spray Place 1 spray into the nose 2 (two) times daily as needed for rhinitis or allergies.    [provider]  benzonatate (TESSALON) 200 MG capsule Take 200 mg by mouth 3 (three) times daily as needed for cough.    [provider]  budesonide-formoterol (SYMBICORT) 160-4.5 MCG/ACT inhaler INHALE 2 PUFFS INTO THE LUNGS IN THE MORNING AND AT BEDTIME 06/02/22    Jetty Duhamel D, MD  cefdinir (OMNICEF) 300 MG capsule 1 twice daily 06/25/23   Jetty Duhamel D, MD  cetirizine (ZYRTEC) 10 MG tablet Take 10 mg by mouth every morning.    [provider]  Coenzyme Q10 (COQ10) 100 MG CAPS Take 100 mg by mouth every evening.    [provider]  colchicine (COLCRYS) 0.6 MG tablet Take 0.6 mg by mouth every other day.    [provider]  dextromethorphan (DELSYM) 30 MG/5ML liquid Take 30 mg by mouth daily as needed for cough.    [provider]  diclofenac Sodium (VOLTAREN) 1 % GEL Apply 2 g topically 4 (four) times daily as needed (to affected areas- for arthritis pain.).    [provider]  diphenhydrAMINE (BENADRYL) 25 mg capsule Take 50 mg by mouth at bedtime as needed for allergies.    [provider]  Erenumab-aooe (AIMOVIG) 140 MG/ML SOAJ Inject 140 mg into the skin every 28 (twenty-eight) days. 12/30/22   Van Clines, MD  Ferrous Sulfate (IRON) 142 (45 Fe) MG TBCR Take 142 mg by mouth in the morning and at bedtime.    [provider]  Fiber 500 MG CAPS Take 1,000 mg by mouth in the morning.    [provider]  finasteride (PROSCAR) 5 MG tablet Take 5 mg by mouth at bedtime.  01/14/14   [provider]  Guaifenesin (MUCINEX MAXIMUM STRENGTH) 1200 MG TB12 Take 1,200 mg by mouth 2 (two)  times daily as needed (cough).    [provider]  ibuprofen (ADVIL) 200 MG tablet Take 400 mg by mouth 2 (two) times daily as needed for headache (or pain).    [provider]  ipratropium (ATROVENT) 0.06 % nasal spray Place 2 sprays into both nostrils 2 (two) times daily.    [provider]  ipratropium-albuterol (DUONEB) 0.5-2.5 (3) MG/3ML SOLN Take 3 mLs by nebulization every 6 (six) hours as needed. 08/25/22   Waymon Budge, MD  levothyroxine (SYNTHROID, LEVOTHROID) 112 MCG tablet Take 112 mcg by mouth daily before breakfast.     [provider]   liothyronine (CYTOMEL) 25 MCG tablet Take 25 mcg by mouth daily before breakfast.    [provider]  losartan (COZAAR) 50 MG tablet TAKE 1 TABLET BY MOUTH EVERY DAY 10/01/20   Lovett Sox, MD  Menthol, Topical Analgesic, (BLUE-EMU MAXIMUM STRENGTH EX) Apply 1 application topically 3 (three) times daily as needed (arthritis pain.).    [provider]  methocarbamol (ROBAXIN) 750 MG tablet Take 1 tablet (750 mg total) by mouth 4 (four) times daily as needed (use for muscle cramps/pain). 06/11/22   Karie Soda, MD  metoprolol tartrate (LOPRESSOR) 50 MG tablet TAKE 1 TABLET(50 MG) BY MOUTH TWICE DAILY 09/08/22   Wendall Stade, MD  Multiple Vitamin (MULTIVITAMIN WITH MINERALS) TABS tablet Take 1 tablet by mouth daily with breakfast.    [provider]  naproxen sodium (ALEVE) 220 MG tablet Take 220 mg by mouth 2 (two) times daily as needed (pain/headache).    [provider]  olopatadine (PATANOL) 0.1 % ophthalmic solution Place 1 drop into both eyes 2 (two) times daily as needed for allergies.    [provider]  omeprazole (PRILOSEC) 40 MG capsule Take 1 capsule (40 mg total) by mouth 2 (two) times daily. 10/31/16   Wendall Stade, MD  OVER THE COUNTER MEDICATION Take 1 capsule by mouth 2 (two) times daily. Liposomal supplement    [provider]  oxybutynin (DITROPAN) 5 MG tablet Take 1 tablet (5 mg total) by mouth every 8 (eight) hours as needed for bladder spasms. 03/29/23   McKenzie, Mardene Celeste, MD  oxyCODONE (OXY IR/ROXICODONE) 5 MG immediate release tablet Take 1 tablet (5 mg total) by mouth every 4 (four) hours as needed for moderate pain. 03/29/23   McKenzie, Mardene Celeste, MD  PRESCRIPTION MEDICATION See admin instructions. CPAP- At bedtime    [provider]  rivaroxaban (XARELTO) 20 MG TABS tablet TAKE 1 TABLET(20 MG) BY MOUTH DAILY WITH SUPPER 09/08/22   Wendall Stade, MD  rizatriptan (MAXALT) 10 MG tablet TAKE 1 TABLET BY MOUTH  AS NEEDED FOR MIGRAINE. MAY REPEAT IN 2 HOURS IF NEEDED 12/19/22   Van Clines, MD  saccharomyces boulardii (FLORASTOR) 250 MG capsule Take 250 mg by mouth every morning.    [provider]  sodium chloride (OCEAN) 0.65 % SOLN nasal spray Place 1 spray into both nostrils 2 (two) times daily.    [provider]  tamsulosin (FLOMAX) 0.4 MG CAPS capsule Take 0.4 mg by mouth in the morning and at bedtime.    [provider]  Testosterone Cypionate 200 MG/ML SOLN Inject 200 mg into the muscle See admin instructions. Inject 200 mg (1 ml) intramuscularly once every 7-10 days    [provider]  venlafaxine XR (EFFEXOR-XR) 75 MG 24 hr capsule TAKE 1 CAPSULE(75 MG) BY MOUTH DAILY WITH BREAKFAST 01/22/23   Karel Jarvis,  Lesle Chris, MD  zolpidem (AMBIEN) 10 MG tablet Take 10 mg by mouth at bedtime as needed for sleep. 11/06/21   [provider]      Allergies    Annamaria Helling tosylate], Perphenazine, Amantadines, Amiodarone, Codeine, Levaquin [levofloxacin], Prednisone, and Tramadol    Review of Systems   Review of Systems  Respiratory:  Positive for shortness of breath.     Physical Exam Updated Vital Signs BP (!) 141/112   Pulse 92   Temp 97.6 F (36.4 C)   Resp (!) 23   SpO2 100%  Physical Exam  ED Results / Procedures / Treatments   Labs (all labs ordered are listed, but only abnormal results are displayed) Labs Reviewed  CBC WITH DIFFERENTIAL/PLATELET - Abnormal; Notable for the following components:      Result Value   MCV 100.4 (*)    Platelets 124 (*)    All other components within normal limits  COMPREHENSIVE METABOLIC PANEL - Abnormal; Notable for the following components:   Glucose, Bld 100 (*)    Calcium 8.6 (*)    Total Protein 6.0 (*)    Albumin 3.4 (*)    All other components within normal limits  BRAIN NATRIURETIC PEPTIDE - Abnormal; Notable for the following components:   B Natriuretic Peptide 512.4 (*)    All other  components within normal limits  TROPONIN I (HIGH SENSITIVITY)  TROPONIN I (HIGH SENSITIVITY)    EKG EKG Interpretation Date/Time:  Saturday June 27 2023 14:55:50 EDT Ventricular Rate:  83 PR Interval:    QRS Duration:  130 QT Interval:  429 QTC Calculation: 505 R Axis:   -43  Text Interpretation: Atrial fibrillation Left bundle branch block Confirmed by Bethann Berkshire [id number]) on 06/27/2023 6:51:13 PM  Radiology DG Chest 2 View  Result Date: 06/27/2023 CLINICAL DATA:  Shortness of breath. EXAM: CHEST - 2 VIEW COMPARISON:  Chest radiograph dated [phone]. FINDINGS: There is cardiomegaly with central vascular congestion. There is blunting of the costophrenic angles which may represent atelectasis/scarring. Pneumonia is not excluded. No large pleural effusion. No pneumothorax. 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 No acute osseous pathology. Degenerative changes of the spine. IMPRESSION: 1. Cardiomegaly with central vascular congestion. 2. Bibasilar atelectasis/scarring.  Pneumonia is not excluded. Electronically Signed   By: Elgie Collard M.D.   On: 06/27/2023 16:45    Procedures Procedures  {Document cardiac monitor, telemetry assessment procedure when appropriate:1}  Medications Ordered in ED Medications  ALPRAZolam (XANAX) tablet 0.5 mg (has no administration in time range)  furosemide (LASIX) injection 20 mg (has no administration in time range)    ED Course/ Medical Decision Making/ A&P   {   Click here for ABCD2, HEART and other calculatorsREFRESH Note before signing :1}                              Medical Decision Making Amount and/or Complexity of Data Reviewed Labs: ordered. Radiology: ordered. ECG/medicine tests: ordered.  Risk Prescription drug management.   Patient with anxiety and shortness of breath possible mild heart failure.  Patient is placed on Lasix and will follow-up with PCP or cardiologist  {Document critical care time when  appropriate:1} {Document review of labs and clinical decision tools ie heart score, Chads2Vasc2 etc:1}  {Document your independent review of radiology images, and any outside records:1} {Document your discussion with family members, caretakers, and with consultants:1} {Document social determinants of health affecting pt's care:1} {  Document your decision making why or why not admission, treatments were needed:1} Final Clinical Impression(s) / ED Diagnoses Final diagnoses:  Dyspnea, unspecified type    Rx / DC Orders ED Discharge Orders          Ordered    furosemide (LASIX) 20 MG tablet  Daily        06/27/23 2009

## 2023-06-27 NOTE — ED Notes (Signed)
Lab called to add on BNP 

## 2023-06-27 NOTE — Discharge Instructions (Signed)
Follow-up with your cardiologist or your family doctor this week

## 2023-06-27 NOTE — ED Notes (Signed)
Patient requesting O2 be put on. O2 on RA was 96%. Patient educated on not needing oxygen at this time. Patient insistent on having oxygen on. Patient placed on 1L nasal canula for comfort.

## 2023-06-27 NOTE — ED Triage Notes (Signed)
BIBA from home for [facility] x 3 days. Pt also c/o cough, left side chest pressure, sweating. Pt has hx of nodule on left lung and is on abx. Pt took 324 aspirin PTA. Afib ( hx of this) 97% room air, placed on 2 lpm for comfort 158/90 BP 80 HR

## 2023-06-27 NOTE — ED Notes (Signed)
Discharge has been pending due to IV and meds. EMS IV was infiltrating

## 2023-06-28 ENCOUNTER — Encounter: Payer: Self-pay | Admitting: Internal Medicine

## 2023-06-28 DIAGNOSIS — R0600 Dyspnea, unspecified: Secondary | ICD-10-CM | POA: Diagnosis not present

## 2023-06-28 DIAGNOSIS — R911 Solitary pulmonary nodule: Secondary | ICD-10-CM | POA: Insufficient documentation

## 2023-06-28 NOTE — Assessment & Plan Note (Signed)
Plan - f/u CT chest in 4 months

## 2023-06-28 NOTE — Assessment & Plan Note (Signed)
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 Plan- Omnicef, CXR, CBC w diff

## 2023-06-28 NOTE — Assessment & Plan Note (Signed)
Benefits from CPAP with good compliance and control Plan- continue 14 cwp

## 2023-06-28 NOTE — Assessment & Plan Note (Signed)
Emphasize reflux precautions 

## 2023-06-29 ENCOUNTER — Encounter: Payer: Self-pay | Admitting: Cardiovascular Disease

## 2023-06-29 DIAGNOSIS — I480 Paroxysmal atrial fibrillation: Secondary | ICD-10-CM

## 2023-07-01 ENCOUNTER — Encounter: Payer: Self-pay | Admitting: Internal Medicine

## 2023-07-08 ENCOUNTER — Ambulatory Visit: Payer: Medicare Other | Attending: Interventional Cardiology | Admitting: Interventional Cardiology

## 2023-07-08 ENCOUNTER — Encounter: Payer: Self-pay | Admitting: Interventional Cardiology

## 2023-07-08 ENCOUNTER — Ambulatory Visit (HOSPITAL_COMMUNITY): Payer: Medicare Other | Admitting: Physician Assistant

## 2023-07-08 VITALS — BP 136/98 | HR 80 | Ht 70.0 in | Wt 238.0 lb

## 2023-07-08 DIAGNOSIS — I10 Essential (primary) hypertension: Secondary | ICD-10-CM

## 2023-07-08 DIAGNOSIS — Z952 Presence of prosthetic heart valve: Secondary | ICD-10-CM | POA: Diagnosis not present

## 2023-07-08 DIAGNOSIS — I483 Typical atrial flutter: Secondary | ICD-10-CM

## 2023-07-08 DIAGNOSIS — I480 Paroxysmal atrial fibrillation: Secondary | ICD-10-CM

## 2023-07-08 DIAGNOSIS — Z7901 Long term (current) use of anticoagulants: Secondary | ICD-10-CM

## 2023-07-08 DIAGNOSIS — G4733 Obstructive sleep apnea (adult) (pediatric): Secondary | ICD-10-CM

## 2023-07-08 DIAGNOSIS — G2401 Drug induced subacute dyskinesia: Secondary | ICD-10-CM

## 2023-07-08 DIAGNOSIS — I5032 Chronic diastolic (congestive) heart failure: Secondary | ICD-10-CM | POA: Diagnosis not present

## 2023-07-08 DIAGNOSIS — R0602 Shortness of breath: Secondary | ICD-10-CM | POA: Diagnosis not present

## 2023-07-08 MED ORDER — FUROSEMIDE 40 MG PO TABS: 40.0000 mg | ORAL_TABLET | Freq: Every day | ORAL | 3 refills | Status: DC

## 2023-07-08 NOTE — Patient Instructions (Signed)
Medication Instructions:  Your physician has recommended you make the following change in your medication: Start furosemide 40 mg by mouth daily    *If you need a refill on your cardiac medications before your next appointment, please call your pharmacy*   Lab Work: none If you have labs (blood work) drawn today and your tests are completely normal, you will receive your results only by: MyChart Message (if you have MyChart) OR A paper copy in the mail If you have any lab test that is abnormal or we need to change your treatment, we will call you to review the results.   Testing/Procedures: none   Follow-Up: At [facility], you and your health needs are our priority.  As part of our continuing mission to provide you with exceptional heart care, we have created designated Provider Care Teams.  These Care Teams include your primary Cardiologist (physician) and Advanced Practice Providers (APPs -  Physician Assistants and Nurse Practitioners) who all work together to provide you with the care you need, when you need it.  We recommend signing up for the patient portal called "MyChart".  Sign up information is provided on this After Visit Summary.  MyChart is used to connect with patients for Virtual Visits (Telemedicine).  Patients are able to view lab/test results, encounter notes, upcoming appointments, etc.  Non-urgent messages can be sent to your provider as well.   To learn more about what you can do with MyChart, go to [email or web address].    Your next appointment:   October 22  Provider:   Charlton Haws, MD     Other Instructions

## 2023-07-08 NOTE — Progress Notes (Signed)
Cardiology Office Note   Date:  07/08/2023   ID:  Grant [name], DOB 1951/06/28, MRN [id number]  PCP:  Noberto Retort, MD    No chief complaint on file.  SHOB  Wt Readings from Last 3 Encounters:  07/08/23 238 lb (108 kg)  06/25/23 237 lb 9.6 oz (107.8 kg)  06/01/23 233 lb (105.7 kg)       History of Present Illness: Grant [name] is a 72 y.o. male with extensive cardiac history.  Prior records show he has a "history of PAF, HTN, OSA on CPAP with AVR PVT  for bicuspid valve 2018 [place]  (25mm Edwards pericardial Magna Ease)  No CAD at cath done 06/12/17 prior to surgery    Had recurrent PAF 07/28/17 converted with iv cardizem and seen by EP Dr Ladona Ridgel who recommended amiodarone 200 mg daily and changed back to  xarelto for anticoagulation    History of  angioedema. 2020 seen by Dr  Terance Hart ER and given steroids and benadryl  Was not On ACE or ARB He does not tolerate oral steroids and given zyrtec pepcid and steroid injection Told to discuss with primary stopping Tilafon (antipsychoitic)  and Proscar which have been implicated in angioedema Had recurrence off amiodarone so clearly not this Had ? Global amnesia episode April 2020 MRI head normal    Spending a lot of time at [facility] Known fracture of sternal wires but no issues    Needs robotic sigmoid resection with Dr Michaell Cowing. Tested preoperative positive for COVID 05/22/21 Surgery rescheduled for 06/12/21 He has had recurrent diverticulitis with stricture    TTE: 05/01/21 EF 50-55% chronically mildly elevated gradients 25 mm [facility] Ease valve mean 21 mmHg    He was admitted in January 2023  with a febrile illness and grew out strep mitis/oralis on blood cultures.  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  A gated cardiac CTA was also suspicious but felt that circumferential thickening could be related to pledget material from the time  of surgery.  D/c after prolonged hospital stay 11/05/21 Developed PAF/flutter around the time of PIC line change CXR showed PIC line at cavoatrial junction Rx xarelto and lopressor for rate control He was cardioverted in the EP lab by Dr Lalla Brothers on 11/28/21    Called office 02/28/22 postural paleness and dizzy? Low pulse BP ok oxygen 70% range per wife Improved wearing his CPAP Rx with doxycycline from time/time for bronchitis    06/11/22 had hernia surgery with Dr Coralee Pesa held with no issues   Seen in ED 09/11/22 with blurred vision and in office today has had tardive dyskinesia which is quite new and prominent today. He has lip smacking and constant UE/LE movements Med list does not reveal and obvious culprit CT in ER only with old lacunar infarct no acute changes  Seen by neuro and MRI no acute findings Huntington Dx panel negative Tried on Ingrezza but did not tolerate ? 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  He was prescribed Lasix 20 mg daily.  He cannot tell much difference.    Wife reports that there is some blood in the mucous.  Troponins were negative in the ER.    Past Medical History:  Diagnosis Date  Allergic rhinitis    Anxiety    Arthritis    Asthma    Bladder tumor    Chronic fatigue    COVID-19 05/22/2021   Depression    06/30/17 Pt denies being depressed, reports Effexor is taken for Chronic Fatigue    Dyspnea    Enlarged prostate    Fibromyalgia    GERD (gastroesophageal reflux disease)    History of chronic bronchitis    History of migraine    light and sound sensitive   History of toxic encephalopathy    Hypothyroidism    OSA on CPAP    CPAP 14   PAF (paroxysmal atrial fibrillation) (HCC) CARDIOLOGIST -- DR Eden Emms   DX OCT 2013   Pneumonia 2023   S/P AVR (aortic valve replacement) and aortoplasty    Stroke [facility])    has  post stroke chorea   Unspecified essential hypertension    Urethral tumor    PROSTATIC    Past Surgical History:  Procedure Laterality Date   BENTALL PROCEDURE N/A 07/02/2017   Procedure: BENTALL PROCEDURE;  Surgeon: Kerin Perna, MD;  Location: [facility] OR;  Service: Open Heart Surgery;  Laterality: N/A;  WITH CIRC ARREST   BENTALL PROCEDURE  07/02/2017   w/ Aortic valve replacement    by Dr. Donata Clay   BIOPSY  02/06/2021   Procedure: BIOPSY;  Surgeon: Vida Rigger, MD;  Location: [facility] ENDOSCOPY;  Service: Endoscopy;;   BUBBLE STUDY  11/01/2021   Procedure: BUBBLE STUDY;  Surgeon: Sande Rives, MD;  Location: [facility] ENDOSCOPY;  Service: Cardiovascular;;   CARDIOVERSION N/A 11/28/2021   Procedure: CARDIOVERSION (CATH LAB);  Surgeon: Lanier Prude, MD;  Location: [facility] INVASIVE CV LAB;  Service: Cardiovascular;  Laterality: N/A;   COLONOSCOPY     COLONOSCOPY WITH PROPOFOL N/A 02/06/2021   Procedure: COLONOSCOPY WITH PROPOFOL;  Surgeon: Vida Rigger, MD;  Location: [facility] ENDOSCOPY;  Service: Endoscopy;  Laterality: N/A;   CORONARY ANGIOPLASTY  06/12/2017   CYSTOSCOPY W/ RETROGRADES Bilateral 06/15/2013   Procedure: CYSTOSCOPY WITH BILATERAL RETROGRADE PYELOGRAM  BLADDER BIOPSY, PROSTATIC URETHRAL BIOPSY, ;  Surgeon: Milford Cage, MD;  Location: [facility];  Service: Urology;  Laterality: Bilateral;   ESOPHAGOGASTRODUODENOSCOPY (EGD) WITH PROPOFOL N/A 03/11/2018   Procedure: ESOPHAGOGASTRODUODENOSCOPY (EGD) WITH PROPOFOL;  Surgeon: Vida Rigger, MD;  Location: [facility] ENDOSCOPY;  Service: Endoscopy;  Laterality: N/A;   EXCISION OF MESH N/A 06/11/2022   Procedure: REMOVAL OF OLD MESH;  Surgeon: Karie Soda, MD;  Location: [facility] ORS;  Service: General;  Laterality: N/A;   LAPAROSCOPIC CHOLECYSTECTOMY  01/14/2001   PROCTOSCOPY N/A 06/12/2021   Procedure: RIGID PROCTOSCOPY;  Surgeon: Karie Soda, MD;  Location: [facility] ORS;  Service: General;  Laterality: N/A;   RIGHT/LEFT HEART  CATH AND CORONARY ANGIOGRAPHY N/A 06/12/2017   Procedure: RIGHT/LEFT HEART CATH AND CORONARY ANGIOGRAPHY;  Surgeon: Laurey Morale, MD;  Location: [facility] INVASIVE CV LAB;  Service: Cardiovascular;  Laterality: N/A;   TEE WITHOUT CARDIOVERSION N/A 07/02/2017   Procedure: TRANSESOPHAGEAL ECHOCARDIOGRAM (TEE);  Surgeon: Donata Clay, Theron Arista, MD;  Location: [facility] OR;  Service: Open Heart Surgery;  Laterality: N/A;   TEE WITHOUT CARDIOVERSION N/A 11/01/2021   Procedure: TRANSESOPHAGEAL ECHOCARDIOGRAM (TEE);  Surgeon: Sande Rives, MD;  Location: [facility] ENDOSCOPY;  Service: Cardiovascular;  Laterality: N/A;   UMBILICAL HERNIA REPAIR  01/14/2008   VENTRAL HERNIA REPAIR N/A 06/11/2022   Procedure: LAPAROSCOPIC VENTRAL HERNIA WITH LYSIS OF ADHESIONS;  Surgeon: Karie Soda, MD;  Location: [facility] ORS;  Service: General;  Laterality: N/A;  WITH MESH   WISDOM TOOTH EXTRACTION       Current Outpatient Medications  Medication Sig Dispense Refill   acetaminophen (TYLENOL) 500 MG tablet Take 1,000 mg by mouth every 8 (eight) hours as needed for headache or moderate pain.     albuterol (VENTOLIN HFA) 108 (90 Base) MCG/ACT inhaler INHALE 2 PUFFS INTO THE LUNGS EVERY 6 HOURS AS NEEDED FOR WHEEZING OR SHORTNESS OF BREATH 18 g 12   ALPRAZolam (XANAX) 0.25 MG tablet Take 1 tablet (0.25 mg total) by mouth 3 (three) times daily as needed for anxiety. (Patient taking differently: Take 0.25 mg by mouth 2 (two) times daily as needed for anxiety.) 15 tablet 0   aluminum hydroxide-magnesium carbonate (GAVISCON) 95-358 MG/15ML SUSP Take 15 mLs by mouth 3 (three) times daily as needed for indigestion or heartburn.     azelastine (ASTELIN) 137 MCG/SPRAY nasal spray Place 1 spray into the nose 2 (two) times daily as needed for rhinitis or allergies.     benzonatate (TESSALON) 200 MG capsule Take 200 mg by mouth 3 (three) times daily as needed for cough.     budesonide-formoterol (SYMBICORT) 160-4.5 MCG/ACT inhaler INHALE 2 PUFFS INTO  THE LUNGS IN THE MORNING AND AT BEDTIME 10.2 g 12   cetirizine (ZYRTEC) 10 MG tablet Take 10 mg by mouth every morning.     colchicine (COLCRYS) 0.6 MG tablet Take 0.6 mg by mouth every other day.     dextromethorphan (DELSYM) 30 MG/5ML liquid Take 30 mg by mouth daily as needed for cough.     diclofenac Sodium (VOLTAREN) 1 % GEL Apply 2 g topically 4 (four) times daily as needed (to affected areas- for arthritis pain.).     diphenhydrAMINE (BENADRYL) 25 mg capsule Take 50 mg by mouth at bedtime as needed for allergies.     Erenumab-aooe (AIMOVIG) 140 MG/ML SOAJ Inject 140 mg into the skin every 28 (twenty-eight) days. 1 mL 11   Ferrous Sulfate (IRON) 142 (45 Fe) MG TBCR Take 142 mg by mouth in the morning and at bedtime.     Fiber 500 MG CAPS Take 1,000 mg by mouth in the morning.     finasteride (PROSCAR) 5 MG tablet Take 5 mg by mouth at bedtime.      furosemide (LASIX) 20 MG tablet Take 1 tablet (20 mg total) by mouth daily. 5 tablet 0   Guaifenesin (MUCINEX MAXIMUM STRENGTH) 1200 MG TB12 Take 1,200 mg by mouth 2 (two) times daily as needed (cough).     ibuprofen (ADVIL) 200 MG tablet Take 400 mg by mouth 2 (two) times daily as needed for headache (or pain).     ipratropium (ATROVENT) 0.06 % nasal spray Place 2 sprays into both nostrils 2 (two) times daily.     ipratropium-albuterol (DUONEB) 0.5-2.5 (3) MG/3ML SOLN Take 3 mLs by nebulization every 6 (six) hours as needed. 360 mL 3   levothyroxine (SYNTHROID, LEVOTHROID) 112 MCG tablet Take 112 mcg by mouth daily before breakfast.      liothyronine (CYTOMEL) 25 MCG tablet Take 25 mcg by mouth daily before breakfast.     losartan (COZAAR) 50 MG tablet TAKE 1 TABLET BY MOUTH EVERY DAY 30 tablet 6   Menthol, Topical Analgesic, (BLUE-EMU MAXIMUM STRENGTH EX) Apply 1 application topically 3 (three) times daily as needed (arthritis pain.).     methocarbamol (ROBAXIN) 750 MG tablet Take 1 tablet (750 mg total) by mouth 4 (four) times daily as needed  (  use for muscle cramps/pain). 30 tablet 2   metoprolol tartrate (LOPRESSOR) 50 MG tablet TAKE 1 TABLET(50 MG) BY MOUTH TWICE DAILY 180 tablet 3   Multiple Vitamin (MULTIVITAMIN WITH MINERALS) TABS tablet Take 1 tablet by mouth daily with breakfast.     naproxen sodium (ALEVE) 220 MG tablet Take 220 mg by mouth 2 (two) times daily as needed (pain/headache).     olopatadine (PATANOL) 0.1 % ophthalmic solution Place 1 drop into both eyes 2 (two) times daily as needed for allergies.     omeprazole (PRILOSEC) 40 MG capsule Take 1 capsule (40 mg total) by mouth 2 (two) times daily. 180 capsule 1   PRESCRIPTION MEDICATION See admin instructions. CPAP- At bedtime     rivaroxaban (XARELTO) 20 MG TABS tablet TAKE 1 TABLET(20 MG) BY MOUTH DAILY WITH SUPPER 90 tablet 3   rizatriptan (MAXALT) 10 MG tablet TAKE 1 TABLET BY MOUTH AS NEEDED FOR MIGRAINE. MAY REPEAT IN 2 HOURS IF NEEDED 30 tablet 11   saccharomyces boulardii (FLORASTOR) 250 MG capsule Take 250 mg by mouth every morning.     sodium chloride (OCEAN) 0.65 % SOLN nasal spray Place 1 spray into both nostrils 2 (two) times daily.     Testosterone Cypionate 200 MG/ML SOLN Inject 200 mg into the muscle See admin instructions. Inject 200 mg (1 ml) intramuscularly once every 7-10 days     venlafaxine XR (EFFEXOR-XR) 75 MG 24 hr capsule TAKE 1 CAPSULE(75 MG) BY MOUTH DAILY WITH BREAKFAST 90 capsule 1   zolpidem (AMBIEN) 10 MG tablet Take 10 mg by mouth at bedtime as needed for sleep.     cefdinir (OMNICEF) 300 MG capsule 1 twice daily 14 capsule 0   Current Facility-Administered Medications  Medication Dose Route Frequency Provider Last Rate Last Admin   methylPREDNISolone acetate (DEPO-MEDROL) injection (RADIOLOGY ONLY) 120 mg  120 mg Intramuscular Once Omar Person, MD        Allergies:   Annamaria Helling tosylate], Perphenazine, Amantadines, Amiodarone, Codeine, Levaquin [levofloxacin], Prednisone, and Tramadol    Social History:  The  patient  reports that he quit smoking about 43 years ago. His smoking use included cigarettes. He started smoking about 54 years ago. He has a 13.5 pack-year smoking history. He has never used smokeless tobacco. He reports current alcohol use of about 10.0 standard drinks of alcohol per week. He reports that he does not use drugs.   Family History:  The patient's family history includes Aortic aneurysm (age of onset: 65) in his mother; Heart disease in an other family member; Other in his father.    ROS:  Please see the history of present illness.   Otherwise, review of systems are positive for cough and shortness of breath.   All other systems are reviewed and negative.    PHYSICAL EXAM: VS:  BP (!) 136/98   Pulse 80   Ht 5\' 10"  (1.778 m)   Wt 238 lb (108 kg)   SpO2 96%   BMI 34.15 kg/m  , BMI Body mass index is 34.15 kg/m. GEN: Well nourished, well developed, in no acute distress HEENT: normal Neck: no JVD, carotid bruits, or masses Cardiac: irregularly irregular; no murmurs, rubs, or gallops,no edema  Respiratory:  clear to auscultation bilaterally, normal work of breathing GI: soft, nontender, nondistended, + BS MS: no deformity or atrophy Skin: warm and dry, no rash Neuro:  Strength and sensation are intact, spastic movements Psych: euthymic mood, full affect   EKG:  The ekg ordered today demonstrates AFib, rate controlled   Recent Labs: 11/02/2022: Magnesium 1.7 06/27/2023: ALT 31; B Natriuretic Peptide 512.4; BUN 12; Creatinine, Ser 1.04; Hemoglobin 16.0; Platelets 124; Potassium 3.7; Sodium 138   Lipid Panel    Component Value Date/Time   CHOL 185 02/17/2019 0714   CHOL 184 05/05/2017 0931   TRIG 234 (H) 02/17/2019 0714   HDL 42 02/17/2019 0714   HDL 45 05/05/2017 0931   CHOLHDL 4.4 02/17/2019 0714   VLDL 47 (H) 02/17/2019 0714   LDLCALC 96 02/17/2019 0714   LDLCALC 94 05/05/2017 0931     Other studies Reviewed: Additional studies/ records that were reviewed  today with results demonstrating: ER labs and records reviewed.   ASSESSMENT AND PLAN:  Shortness of breath: He has a persisting cough.  White blood cell count in the emergency room was normal.  Chest x-ray from September 5 did not show pneumonia.  Chest x-ray from September 7 could not rule out pneumonia.  Recurrent atrial fibrillation appears to be new and loss of atrial kick could contribute to volume overload. Status post aortic valve replacement for bicuspid valve in 2018.  Echo was last checked in May 2024 and reviewed by Dr. Eden Emms.  Aortic valve was functioning well.  Normal LV function. PAF: Appears to be in AFib.  DCCV in 2/23.  Consider repeating a cardioversion and potentially adding an antiarrhythmic to maintain sinus rhythm.  Most likely he went out of rhythm within the last few weeks since he did see Dr. Maren Beach in August and A-fib was not noted at that time. Anticoagulated: Has been on Xarelto.  He has not missed any doses of Xarelto in the past 3 weeks.     Current medicines are reviewed at length with the patient today.  The patient concerns regarding his medicines were addressed.  The following changes have been made: Lasix 40 mg daily added.  Will give further guidance on taking the Lasix based on the blood work drawn today, BMP and bmet  Labs/ tests ordered today include:   Orders Placed This Encounter  Procedures   Basic Metabolic Panel (BMET)   Pro b natriuretic peptide   EKG 12-Lead    Recommend 150 minutes/week of aerobic exercise Low fat, low carb, high fiber diet recommended  Disposition:   FU as scheduled with Dr. Eden Emms   Signed, Lance Muss, MD  07/08/2023 5:00 PM    [facility] [address] [place], [place], [place]  [address] Phone: [phone]; Fax: [phone]

## 2023-07-09 ENCOUNTER — Telehealth: Payer: Self-pay

## 2023-07-09 DIAGNOSIS — R0602 Shortness of breath: Secondary | ICD-10-CM

## 2023-07-09 DIAGNOSIS — I480 Paroxysmal atrial fibrillation: Secondary | ICD-10-CM

## 2023-07-09 DIAGNOSIS — I5032 Chronic diastolic (congestive) heart failure: Secondary | ICD-10-CM

## 2023-07-09 LAB — BASIC METABOLIC PANEL
BUN/Creatinine Ratio: 15 (ref 10–24)
BUN: 16 mg/dL (ref 8–27)
CO2: 25 mmol/L (ref 20–29)
Calcium: 9 mg/dL (ref 8.6–10.2)
Chloride: 103 mmol/L (ref 96–106)
Creatinine, Ser: 1.04 mg/dL (ref 0.76–1.27)
Glucose: 104 mg/dL — ABNORMAL HIGH (ref 70–99)
Potassium: 4.4 mmol/L (ref 3.5–5.2)
Sodium: 141 mmol/L (ref 134–144)
eGFR: 77 mL/min/{1.73_m2} (ref >59)

## 2023-07-09 LAB — PRO B NATRIURETIC PEPTIDE: NT-Pro BNP: 2186 pg/mL — ABNORMAL HIGH (ref 0–376)

## 2023-07-09 NOTE — Telephone Encounter (Deleted)
-----   Message from [place] sent at 07/09/2023  8:23 AM EDT ----- Velora Mediate, I saw him as DOD.  I initially wrote for some Lasix prior to getting lab results back, but will adjust the dose.   BNP more elevated than prior. Would have him take furosemide 40 mg BID for 3 days, then furosemide 40 mg daily.  BMet next Tuesday or Wednesday.

## 2023-07-09 NOTE — Telephone Encounter (Signed)
Wendall Stade, MD  Ethelda Chick, RN Make sure this communicated to patient     ----- Message ----- From: Corky Crafts, MD Sent: 07/08/2023   5:17 PM EDT To: Wendall Stade, MD  BNP was elevated in the ER.  He was treated with Lasix 20 mg daily.  Recheck to bmet and BNP today.  Lasix 40 mg started and dose to be adjusted based on lab results.  I also discussed cardioversion with them as his A-fib recurrence appears to be new.  He may need an antiarrhythmic.    Today- 07/09/23 Called patient's wife and talked to her at length about getting patient in at the A. FIB clinic to discuss antiarrhythmic meds/ cardioversion. Went over lab work results. Patient will take the extra lasix for 3 days, and then back to Lasix 40 mg by mouth daily. Patient will come in on Wednesday for lab work pre lab results from yesterday. Patient's wife verbalized understanding.

## 2023-07-09 NOTE — Telephone Encounter (Signed)
-----   Message from [place] sent at 07/09/2023  8:23 AM EDT ----- Velora Mediate, I saw him as DOD.  I initially wrote for some Lasix prior to getting lab results back, but will adjust the dose.   BNP more elevated than prior. Would have him take furosemide 40 mg BID for 3 days, then furosemide 40 mg daily.  BMet next Tuesday or Wednesday.

## 2023-07-15 ENCOUNTER — Ambulatory Visit (HOSPITAL_COMMUNITY)
Admission: RE | Admit: 2023-07-15 | Discharge: 2023-07-15 | Disposition: A | Discharge status: Home / Self Care | Payer: Medicare Other | Source: Ambulatory Visit | Attending: Internal Medicine | Admitting: Internal Medicine

## 2023-07-15 ENCOUNTER — Other Ambulatory Visit: Payer: Medicare Other

## 2023-07-15 ENCOUNTER — Other Ambulatory Visit: Payer: Self-pay

## 2023-07-15 VITALS — BP 150/84 | HR 85 | Ht 70.0 in | Wt 233.0 lb

## 2023-07-15 DIAGNOSIS — Z7901 Long term (current) use of anticoagulants: Secondary | ICD-10-CM | POA: Insufficient documentation

## 2023-07-15 DIAGNOSIS — Z79899 Other long term (current) drug therapy: Secondary | ICD-10-CM | POA: Diagnosis not present

## 2023-07-15 DIAGNOSIS — Z8673 Personal history of transient ischemic attack (TIA), and cerebral infarction without residual deficits: Secondary | ICD-10-CM | POA: Diagnosis not present

## 2023-07-15 DIAGNOSIS — G4733 Obstructive sleep apnea (adult) (pediatric): Secondary | ICD-10-CM | POA: Insufficient documentation

## 2023-07-15 DIAGNOSIS — E785 Hyperlipidemia, unspecified: Secondary | ICD-10-CM | POA: Diagnosis not present

## 2023-07-15 DIAGNOSIS — I10 Essential (primary) hypertension: Secondary | ICD-10-CM | POA: Insufficient documentation

## 2023-07-15 DIAGNOSIS — K219 Gastro-esophageal reflux disease without esophagitis: Secondary | ICD-10-CM | POA: Insufficient documentation

## 2023-07-15 DIAGNOSIS — D6869 Other thrombophilia: Secondary | ICD-10-CM | POA: Insufficient documentation

## 2023-07-15 DIAGNOSIS — I4819 Other persistent atrial fibrillation: Secondary | ICD-10-CM | POA: Insufficient documentation

## 2023-07-15 DIAGNOSIS — I2510 Atherosclerotic heart disease of native coronary artery without angina pectoris: Secondary | ICD-10-CM | POA: Diagnosis not present

## 2023-07-15 LAB — BRAIN NATRIURETIC PEPTIDE: B Natriuretic Peptide: 445.9 pg/mL — ABNORMAL HIGH (ref 0.0–100.0)

## 2023-07-15 LAB — BASIC METABOLIC PANEL
Anion gap: 8 (ref 5–15)
BUN: 13 mg/dL (ref 8–23)
CO2: 27 mmol/L (ref 22–32)
Calcium: 8.9 mg/dL (ref 8.9–10.3)
Chloride: 101 mmol/L (ref 98–111)
Creatinine, Ser: 1.03 mg/dL (ref 0.61–1.24)
GFR, Estimated: >60 mL/min (ref >60)
Glucose, Bld: 92 mg/dL (ref 70–99)
Potassium: 3.8 mmol/L (ref 3.5–5.1)
Sodium: 136 mmol/L (ref 135–145)

## 2023-07-15 NOTE — Patient Instructions (Signed)
Cardioversion scheduled for: Friday, October 4th   - Arrive at the [facility] and go to admitting at 1030am   - Do not eat or drink anything after midnight the night prior to your procedure.   - Take all your morning medication (except diabetic medications) with a sip of water prior to arrival.  - You will not be able to drive home after your procedure.    - Do NOT miss any doses of your blood thinner - if you should miss a dose please notify our office immediately.   - If you feel as if you go back into normal rhythm prior to scheduled cardioversion, please notify our office immediately.   If your procedure is canceled in the cardioversion suite you will be charged a cancellation fee.

## 2023-07-15 NOTE — Progress Notes (Addendum)
Primary Care Physician: Noberto Retort, MD Primary Cardiologist: Charlton Haws, MD Electrophysiologist: None     Referring Physician: Dr. Sandra Cockayne is a 72 y.o. 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 Seen by Dr. Eldridge Dace on 9/18 and noted to be in rate controlled Afib; lasix 40 mg daily added. Patient is on Xarelto for a CHADS2VASC score of 5.  On evaluation today, he is currently in rate controlled Afib. He notes temporary increase of lasix helped him lose significant weight. He has not missed any doses of Xarelto in the past 3 weeks.   Today, he denies symptoms of palpitations, chest pain, shortness of breath, orthopnea, PND, lower extremity edema, dizziness, presyncope, syncope, snoring, daytime somnolence, bleeding, or neurologic sequela. The patient is tolerating medications without difficulties and is otherwise without complaint today.    Atrial Fibrillation Risk Factors:  he does have symptoms or diagnosis of sleep apnea. he is compliant with CPAP therapy.  he has a BMI of Body mass index is 33.43 kg/m.Marland Kitchen Filed Weights   07/15/23 0847  Weight: 105.7 kg    Current Outpatient Medications  Medication Sig Dispense Refill   acetaminophen (TYLENOL) 500 MG tablet Take 1,000 mg by mouth every 8 (eight) hours as needed for headache or moderate pain.     albuterol (VENTOLIN HFA) 108 (90 Base) MCG/ACT inhaler INHALE 2 PUFFS INTO THE LUNGS EVERY 6 HOURS AS NEEDED FOR WHEEZING OR SHORTNESS OF BREATH 18 g 12   ALPRAZolam (XANAX) 0.25 MG tablet Take 1 tablet (0.25 mg total) by mouth 3 (three) times daily as needed for anxiety. (Patient taking differently: Take 0.25 mg by mouth as needed for anxiety.) 15 tablet 0   aluminum hydroxide-magnesium carbonate (GAVISCON) 95-358 MG/15ML SUSP  Take 15 mLs by mouth 3 (three) times daily as needed for indigestion or heartburn.     azelastine (ASTELIN) 137 MCG/SPRAY nasal spray Place 1 spray into the nose 2 (two) times daily as needed for rhinitis or allergies.     benzonatate (TESSALON) 200 MG capsule Take 200 mg by mouth 3 (three) times daily as needed for cough.     budesonide-formoterol (SYMBICORT) 160-4.5 MCG/ACT inhaler INHALE 2 PUFFS INTO THE LUNGS IN THE MORNING AND AT BEDTIME 10.2 g 12   cetirizine (ZYRTEC) 10 MG tablet Take 10 mg by mouth every morning.     colchicine (COLCRYS) 0.6 MG tablet Take 0.6 mg by mouth every other day.     dextromethorphan (DELSYM) 30 MG/5ML liquid Take 30 mg by mouth daily as needed for cough.     diclofenac Sodium (VOLTAREN) 1 % GEL Apply 2 g topically 4 (four) times daily as needed (to affected areas- for arthritis pain.).     diphenhydrAMINE (BENADRYL) 25 mg capsule Take 50 mg by mouth at bedtime as needed for allergies.     Erenumab-aooe (AIMOVIG) 140 MG/ML SOAJ Inject 140 mg into the skin every 28 (twenty-eight) days. 1 mL 11   Ferrous Sulfate (IRON) 142 (45 Fe) MG TBCR Take 142 mg by mouth in the morning and at bedtime.     Fiber 500 MG CAPS Take 1,000 mg by mouth in the morning.     finasteride (PROSCAR) 5 MG tablet Take 5 mg by mouth at bedtime.      furosemide (LASIX)  40 MG tablet Take 1 tablet (40 mg total) by mouth daily. 30 tablet 3   Guaifenesin (MUCINEX MAXIMUM STRENGTH) 1200 MG TB12 Take 1,200 mg by mouth 2 (two) times daily as needed (cough).     ibuprofen (ADVIL) 200 MG tablet Take 400 mg by mouth 2 (two) times daily as needed for headache (or pain).     ipratropium (ATROVENT) 0.06 % nasal spray Place 2 sprays into both nostrils 2 (two) times daily.     ipratropium-albuterol (DUONEB) 0.5-2.5 (3) MG/3ML SOLN Take 3 mLs by nebulization every 6 (six) hours as needed. 360 mL 3   levothyroxine (SYNTHROID, LEVOTHROID) 112 MCG tablet Take 112 mcg by mouth daily before breakfast.       liothyronine (CYTOMEL) 25 MCG tablet Take 25 mcg by mouth daily before breakfast.     losartan (COZAAR) 50 MG tablet TAKE 1 TABLET BY MOUTH EVERY DAY 30 tablet 6   Menthol, Topical Analgesic, (BLUE-EMU MAXIMUM STRENGTH EX) Apply 1 application topically 3 (three) times daily as needed (arthritis pain.).     methocarbamol (ROBAXIN) 750 MG tablet Take 1 tablet (750 mg total) by mouth 4 (four) times daily as needed (use for muscle cramps/pain). 30 tablet 2   metoprolol tartrate (LOPRESSOR) 50 MG tablet TAKE 1 TABLET(50 MG) BY MOUTH TWICE DAILY 180 tablet 3   Multiple Vitamin (MULTIVITAMIN WITH MINERALS) TABS tablet Take 1 tablet by mouth daily with breakfast.     naproxen sodium (ALEVE) 220 MG tablet Take 220 mg by mouth 2 (two) times daily as needed (pain/headache).     olopatadine (PATANOL) 0.1 % ophthalmic solution Place 1 drop into both eyes 2 (two) times daily as needed for allergies.     omeprazole (PRILOSEC) 40 MG capsule Take 1 capsule (40 mg total) by mouth 2 (two) times daily. 180 capsule 1   PRESCRIPTION MEDICATION See admin instructions. CPAP- At bedtime     rivaroxaban (XARELTO) 20 MG TABS tablet TAKE 1 TABLET(20 MG) BY MOUTH DAILY WITH SUPPER 90 tablet 3   rizatriptan (MAXALT) 10 MG tablet TAKE 1 TABLET BY MOUTH AS NEEDED FOR MIGRAINE. MAY REPEAT IN 2 HOURS IF NEEDED 30 tablet 11   saccharomyces boulardii (FLORASTOR) 250 MG capsule Take 250 mg by mouth every morning.     sodium chloride (OCEAN) 0.65 % SOLN nasal spray Place 1 spray into both nostrils 2 (two) times daily.     Testosterone Cypionate 200 MG/ML SOLN Inject 200 mg into the muscle See admin instructions. Inject 200 mg (1 ml) intramuscularly once every 7-10 days     venlafaxine XR (EFFEXOR-XR) 75 MG 24 hr capsule TAKE 1 CAPSULE(75 MG) BY MOUTH DAILY WITH BREAKFAST 90 capsule 1   zolpidem (AMBIEN) 10 MG tablet Take 10 mg by mouth at bedtime as needed for sleep.     Current Facility-Administered Medications  Medication Dose Route  Frequency Provider Last Rate Last Admin   methylPREDNISolone acetate (DEPO-MEDROL) injection (RADIOLOGY ONLY) 120 mg  120 mg Intramuscular Once Omar Person, MD        Atrial Fibrillation Management history:  Previous antiarrhythmic drugs: amiodarone - tongue swelling Previous cardioversions: 11/28/21 Previous ablations: None Anticoagulation history: Xarelto   ROS- All systems are reviewed and negative except as per the HPI above.  Physical Exam: BP (!) 150/84   Pulse 85   Ht 5\' 10"  (1.778 m)   Wt 105.7 kg   BMI 33.43 kg/m   GEN: Well nourished, well developed in no acute distress NECK: No  JVD; No carotid bruits CARDIAC: Irregularly irregular rate and rhythm, no murmurs, rubs, gallops RESPIRATORY:  Clear to auscultation without rales, wheezing or rhonchi  ABDOMEN: Soft, non-tender, non-distended EXTREMITIES:  No edema; No deformity   EKG today demonstrates  Vent. rate 85 BPM PR interval * ms QRS duration 128 ms QT/QTcB 396/471 ms P-R-T axes * 0 211 Atrial fibrillation Non-specific intra-ventricular conduction block Minimal voltage criteria for LVH, may be normal variant ( Cornell product ) Nonspecific T wave abnormality Abnormal ECG When compared with ECG of 08-Jul-2023 16:48, PREVIOUS ECG IS PRESENT  Echo 03/05/23 demonstrated   1. Left ventricular ejection fraction, by estimation, is 55 to 60%. The  left ventricle has normal function. The left ventricle has no regional  wall motion abnormalities. There is mild left ventricular hypertrophy.  Left ventricular diastolic parameters  were normal.   2. Right ventricular systolic function is normal. The right ventricular  size is normal. There is moderately elevated pulmonary artery systolic  pressure.   3. Left atrial size was moderately dilated.   4. The mitral valve is abnormal. Trivial mitral valve regurgitation. No  evidence of mitral stenosis.   5. 25 mm Edwards bioprosthetic valve mild calcification of  leaflets  gradients stable since prior TTE cone 02/28/22 . The aortic valve has been  repaired/replaced. Aortic valve regurgitation is not visualized. No aortic  stenosis is present. There is a 25  mm Edwards bioprosthetic valve present in the aortic position. Procedure  Date: 10/28/21.   6. Aortic dilatation noted. There is mild dilatation of the aortic root,  measuring 38 mm.   7. The inferior vena cava is normal in size with greater than 50%  respiratory variability, suggesting right atrial pressure of 3 mmHg.   ASSESSMENT & PLAN CHA2DS2-VASc Score = 5  The patient's score is based upon: CHF History: 0 HTN History: 1 Diabetes History: 0 Stroke History: 2 Vascular Disease History: 1 Age Score: 1 Gender Score: 0       ASSESSMENT AND PLAN: Persistent Atrial Fibrillation (ICD10:  I48.19) The patient's CHA2DS2-VASc score is 5, indicating a 7.2% annual risk of stroke.    He is in rate controlled Afib. Review of records show he underwent successful 360J cardioversion 11/2021. After discussion of options for rhythm control including mainly Tikosyn (history of tongue swelling on amiodarone), patient would like to proceed with cardioversion only for now. I did advise wife and patient if he has ERAF, will then need to revisit AAD therapy for long term rhythm control. He states he would rather re-try amiodarone versus hospital admission for Tikosyn. Labs obtained today. Will discuss with Cardiology for patient to undergo cardioversion with 360J defibrillator as done last year. Previous BNP was elevated significantly at 2,186 on 9/18. Will reassess after lasix regimen prescribed by Dr. Eldridge Dace.   Informed Consent   Shared Decision Making/Informed Consent The risks (stroke, cardiac arrhythmias rarely resulting in the need for a temporary or permanent pacemaker, skin irritation or burns and complications associated with conscious sedation including aspiration, arrhythmia, respiratory failure and  death), benefits (restoration of normal sinus rhythm) and alternatives of a direct current cardioversion were explained in detail to Mr. Marsalis and he agrees to proceed.      Secondary Hypercoagulable State (ICD10:  D68.69) The patient is at significant risk for stroke/thromboembolism based upon his CHA2DS2-VASc Score of 5.  Continue Rivaroxaban (Xarelto).  No missed doses.    Follow up as scheduled with Dr. Eden Emms.    Lake Bells, PA-C  [facility] [address] [place], [place] [address] [phone]

## 2023-07-15 NOTE — Addendum Note (Signed)
Encounter addended by: Shona Simpson, RN on: 07/15/2023 11:24 AM  Actions taken: Order list changed, Diagnosis association updated

## 2023-07-16 ENCOUNTER — Encounter: Payer: Self-pay | Admitting: Cardiovascular Disease

## 2023-07-16 ENCOUNTER — Other Ambulatory Visit: Payer: Self-pay | Admitting: Internal Medicine

## 2023-07-17 NOTE — Telephone Encounter (Signed)
Patient's wife is calling to follow up on this message due to wanting confirmation on if he can take an additional lasix today with further instructions on future use before the office closes for the weekend.  Per wife a response can be given via mychart or callback.  Please advise.

## 2023-07-17 NOTE — Telephone Encounter (Signed)
Called patient's wife back about message. Informed her that patient's latest lab work showed that patient's BNP went down some and his BMET looked good. Patient's wife stated patient is really having a hard time with SOB. Tried to encourage her to take him to the ED if he SOB has worsen. She stated that lasix 40 mg BID for 3 days really helped and that she was going to do that again. Informed her that a message will be sent to Dr. Eden Emms to advise on the lasix increased dose.

## 2023-07-20 DIAGNOSIS — G4733 Obstructive sleep apnea (adult) (pediatric): Secondary | ICD-10-CM | POA: Diagnosis not present

## 2023-07-20 NOTE — Addendum Note (Signed)
Encounter addended by: Eustace Pen, PA-C on: 07/20/2023 9:28 AM  Actions taken: Clinical Note Signed

## 2023-07-22 ENCOUNTER — Other Ambulatory Visit (HOSPITAL_COMMUNITY): Payer: Self-pay | Admitting: *Deleted

## 2023-07-22 DIAGNOSIS — G4733 Obstructive sleep apnea (adult) (pediatric): Secondary | ICD-10-CM | POA: Diagnosis not present

## 2023-07-23 ENCOUNTER — Encounter (HOSPITAL_COMMUNITY): Payer: Self-pay | Admitting: Cardiology

## 2023-07-23 ENCOUNTER — Encounter (HOSPITAL_COMMUNITY)
Admission: RE | Disposition: A | Discharge status: Home / Self Care | Payer: Self-pay | Source: Home / Self Care | Attending: Cardiology

## 2023-07-23 ENCOUNTER — Ambulatory Visit (HOSPITAL_COMMUNITY): Payer: Medicare Other | Admitting: Anesthesiology

## 2023-07-23 ENCOUNTER — Other Ambulatory Visit: Payer: Self-pay

## 2023-07-23 ENCOUNTER — Ambulatory Visit (HOSPITAL_COMMUNITY)
Admission: RE | Admit: 2023-07-23 | Discharge: 2023-07-23 | Disposition: A | Discharge status: Home / Self Care | Payer: Medicare Other | Attending: Cardiology | Admitting: Cardiology

## 2023-07-23 DIAGNOSIS — Z7901 Long term (current) use of anticoagulants: Secondary | ICD-10-CM | POA: Insufficient documentation

## 2023-07-23 DIAGNOSIS — I4891 Unspecified atrial fibrillation: Secondary | ICD-10-CM

## 2023-07-23 DIAGNOSIS — I2510 Atherosclerotic heart disease of native coronary artery without angina pectoris: Secondary | ICD-10-CM | POA: Diagnosis not present

## 2023-07-23 DIAGNOSIS — I4819 Other persistent atrial fibrillation: Secondary | ICD-10-CM | POA: Insufficient documentation

## 2023-07-23 DIAGNOSIS — Z953 Presence of xenogenic heart valve: Secondary | ICD-10-CM | POA: Insufficient documentation

## 2023-07-23 DIAGNOSIS — K219 Gastro-esophageal reflux disease without esophagitis: Secondary | ICD-10-CM | POA: Diagnosis not present

## 2023-07-23 DIAGNOSIS — Z79899 Other long term (current) drug therapy: Secondary | ICD-10-CM | POA: Diagnosis not present

## 2023-07-23 DIAGNOSIS — Z6831 Body mass index (BMI) 31.0-31.9, adult: Secondary | ICD-10-CM | POA: Insufficient documentation

## 2023-07-23 DIAGNOSIS — G4733 Obstructive sleep apnea (adult) (pediatric): Secondary | ICD-10-CM | POA: Diagnosis not present

## 2023-07-23 DIAGNOSIS — J45909 Unspecified asthma, uncomplicated: Secondary | ICD-10-CM | POA: Diagnosis not present

## 2023-07-23 DIAGNOSIS — Z8673 Personal history of transient ischemic attack (TIA), and cerebral infarction without residual deficits: Secondary | ICD-10-CM | POA: Insufficient documentation

## 2023-07-23 DIAGNOSIS — I10 Essential (primary) hypertension: Secondary | ICD-10-CM | POA: Diagnosis not present

## 2023-07-23 DIAGNOSIS — E669 Obesity, unspecified: Secondary | ICD-10-CM | POA: Diagnosis not present

## 2023-07-23 DIAGNOSIS — D6869 Other thrombophilia: Secondary | ICD-10-CM | POA: Diagnosis not present

## 2023-07-23 DIAGNOSIS — E039 Hypothyroidism, unspecified: Secondary | ICD-10-CM | POA: Insufficient documentation

## 2023-07-23 DIAGNOSIS — E785 Hyperlipidemia, unspecified: Secondary | ICD-10-CM | POA: Insufficient documentation

## 2023-07-23 HISTORY — PX: CARDIOVERSION: SHX1299

## 2023-07-23 SURGERY — CARDIOVERSION
Anesthesia: General

## 2023-07-23 MED ORDER — PROPOFOL 10 MG/ML IV BOLUS
INTRAVENOUS | Status: DC | PRN
  Administered 2023-07-23: 100 mg via INTRAVENOUS

## 2023-07-23 MED ORDER — SODIUM CHLORIDE 0.9 % IV SOLN
INTRAVENOUS | Status: DC
  Administered 2023-07-23: 20 mL/h via INTRAVENOUS

## 2023-07-23 SURGICAL SUPPLY — 1 items: PAD DEFIB RADIO PHYSIO CONN (PAD) ×2 IMPLANT

## 2023-07-23 NOTE — Anesthesia Postprocedure Evaluation (Signed)
Anesthesia Post Note  Patient: Edgerrin Correia Latona  Procedure(s) Performed: CARDIOVERSION     Patient location during evaluation: PACU Anesthesia Type: General Level of consciousness: awake and alert Pain management: pain level controlled Vital Signs Assessment: post-procedure vital signs reviewed and stable Respiratory status: spontaneous breathing, nonlabored ventilation, respiratory function stable and patient connected to nasal cannula oxygen Cardiovascular status: blood pressure returned to baseline and stable Postop Assessment: no apparent nausea or vomiting Anesthetic complications: no   No notable events documented.  Last Vitals:  Vitals:   07/23/23 1250 07/23/23 1255  BP: 130/87 136/88  Pulse: 65 65  Resp: 19 15  Temp:  37.1 C  SpO2: 94% 98%    Last Pain:  Vitals:   07/23/23 1245  TempSrc:   PainSc: 0-No pain                 Earl Lites P Oryn Casanova

## 2023-07-23 NOTE — H&P (Signed)
ATRIAL FIB OFFICE VISIT 07/15/2023 [place] [facility] at [facility]   Eustace Pen, [place] [facility] Persistent atrial fibrillation [facility]) +1 more Dx Referred by Noberto Retort, MD Reason for Visit   Additional Documentation  Vitals: BP 150/84 Important    Pulse 85   Ht 5\' 10"  (1.778 m)   Wt 105.7 kg   BMI 33.43 kg/m   BSA 2.28 m      More Vitals  Flowsheets: Anthropometrics,   NEWS,   MEWS Score,   Vital Signs,   CHADS2-VASc Score,   Method of Visit  Encounter Info: Billing Info,   History,   Allergies,   Detailed Report   All Notes   Addendum Note by Eustace Pen, PA-C at 07/15/2023 9:00 AM  Author: Eustace Pen, PA-C Author Type: Physician Assistant Certified Filed: 07/20/2023  9:28 AM  Note Status: Signed Cosign: Cosign Not Required Date of Service: 07/15/2023  9:00 AM  Editor: Ermelinda Das (Physician Assistant Certified)             Encounter addended by: Eustace Pen, PA-C on: 07/20/2023  9:28 AM    Actions taken: Clinical Note Signed       Progress Notes by Eustace Pen, PA-C at 07/15/2023 9:00 AM  Author: Eustace Pen, PA-C Author Type: Physician Assistant Certified Filed: 07/20/2023  9:27 AM  Note Status: Addendum Cosign: Cosign Not Required Date of Service: 07/15/2023  9:00 AM  Editor: Ermelinda Das (Physician Assistant Certified)      Prior Versions: 1. Ermelinda Das (Physician Assistant Certified) at 07/15/2023 10:59 AM - Signed  Expand All Collapse All      Primary Care Physician: Noberto Retort, MD Primary Cardiologist: Charlton Haws, MD Electrophysiologist: None      Referring Physician: Dr. Sandra Cockayne is a 72 y.o. 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 Seen by Dr. Eldridge Dace on 9/18 and noted to be in rate controlled Afib; lasix 40 mg daily added. Patient is on Xarelto for a CHADS2VASC score of 5.   On evaluation today, he is currently in rate controlled Afib. He notes temporary increase of lasix helped him lose significant weight. He has not missed any doses of Xarelto in the past 3 weeks.    Today, he denies symptoms of palpitations, chest pain, shortness of breath, orthopnea, PND, lower extremity edema, dizziness, presyncope, syncope, snoring, daytime somnolence, bleeding, or neurologic sequela. The patient is tolerating medications without difficulties and is otherwise without complaint today.      Atrial Fibrillation Risk Factors:   he does have symptoms or diagnosis of sleep apnea. he is compliant with CPAP therapy.   he has a BMI of Body mass index is 33.43 kg/m.Marland Kitchen    Filed Weights    07/15/23 0847  Weight: 105.7 kg            Current Outpatient Medications  Medication Sig Dispense Refill   acetaminophen (TYLENOL) 500 MG tablet Take 1,000 mg by mouth every 8 (eight) hours as needed for headache or moderate pain.       albuterol (VENTOLIN HFA) 108 (90 Base) MCG/ACT inhaler INHALE 2 PUFFS INTO THE LUNGS EVERY 6 HOURS AS NEEDED FOR WHEEZING OR SHORTNESS OF BREATH  18 g 12   ALPRAZolam (XANAX) 0.25 MG tablet Take 1 tablet (0.25 mg total) by mouth 3 (three) times daily as needed for anxiety. (Patient taking differently: Take 0.25 mg by mouth as needed for anxiety.) 15 tablet 0   aluminum hydroxide-magnesium carbonate (GAVISCON) 95-358 MG/15ML SUSP Take 15 mLs by mouth 3 (three) times daily as needed for indigestion or heartburn.       azelastine (ASTELIN) 137 MCG/SPRAY nasal spray Place 1 spray into the nose 2 (two) times daily as needed for rhinitis or allergies.       benzonatate (TESSALON) 200 MG capsule Take 200 mg by mouth 3 (three) times daily as needed for cough.       budesonide-formoterol (SYMBICORT) 160-4.5 MCG/ACT  inhaler INHALE 2 PUFFS INTO THE LUNGS IN THE MORNING AND AT BEDTIME 10.2 g 12   cetirizine (ZYRTEC) 10 MG tablet Take 10 mg by mouth every morning.       colchicine (COLCRYS) 0.6 MG tablet Take 0.6 mg by mouth every other day.       dextromethorphan (DELSYM) 30 MG/5ML liquid Take 30 mg by mouth daily as needed for cough.       diclofenac Sodium (VOLTAREN) 1 % GEL Apply 2 g topically 4 (four) times daily as needed (to affected areas- for arthritis pain.).       diphenhydrAMINE (BENADRYL) 25 mg capsule Take 50 mg by mouth at bedtime as needed for allergies.       Erenumab-aooe (AIMOVIG) 140 MG/ML SOAJ Inject 140 mg into the skin every 28 (twenty-eight) days. 1 mL 11   Ferrous Sulfate (IRON) 142 (45 Fe) MG TBCR Take 142 mg by mouth in the morning and at bedtime.       Fiber 500 MG CAPS Take 1,000 mg by mouth in the morning.       finasteride (PROSCAR) 5 MG tablet Take 5 mg by mouth at bedtime.        furosemide (LASIX) 40 MG tablet Take 1 tablet (40 mg total) by mouth daily. 30 tablet 3   Guaifenesin (MUCINEX MAXIMUM STRENGTH) 1200 MG TB12 Take 1,200 mg by mouth 2 (two) times daily as needed (cough).       ibuprofen (ADVIL) 200 MG tablet Take 400 mg by mouth 2 (two) times daily as needed for headache (or pain).       ipratropium (ATROVENT) 0.06 % nasal spray Place 2 sprays into both nostrils 2 (two) times daily.       ipratropium-albuterol (DUONEB) 0.5-2.5 (3) MG/3ML SOLN Take 3 mLs by nebulization every 6 (six) hours as needed. 360 mL 3   levothyroxine (SYNTHROID, LEVOTHROID) 112 MCG tablet Take 112 mcg by mouth daily before breakfast.        liothyronine (CYTOMEL) 25 MCG tablet Take 25 mcg by mouth daily before breakfast.       losartan (COZAAR) 50 MG tablet TAKE 1 TABLET BY MOUTH EVERY DAY 30 tablet 6   Menthol, Topical Analgesic, (BLUE-EMU MAXIMUM STRENGTH EX) Apply 1 application topically 3 (three) times daily as needed (arthritis pain.).       methocarbamol (ROBAXIN) 750 MG tablet Take 1 tablet  (750 mg total) by mouth 4 (four) times daily as needed (use for muscle cramps/pain). 30 tablet 2   metoprolol tartrate (LOPRESSOR) 50 MG tablet TAKE 1 TABLET(50 MG) BY MOUTH TWICE DAILY 180 tablet 3   Multiple Vitamin (MULTIVITAMIN WITH MINERALS) TABS tablet Take 1 tablet by mouth daily with breakfast.  naproxen sodium (ALEVE) 220 MG tablet Take 220 mg by mouth 2 (two) times daily as needed (pain/headache).       olopatadine (PATANOL) 0.1 % ophthalmic solution Place 1 drop into both eyes 2 (two) times daily as needed for allergies.       omeprazole (PRILOSEC) 40 MG capsule Take 1 capsule (40 mg total) by mouth 2 (two) times daily. 180 capsule 1   PRESCRIPTION MEDICATION See admin instructions. CPAP- At bedtime       rivaroxaban (XARELTO) 20 MG TABS tablet TAKE 1 TABLET(20 MG) BY MOUTH DAILY WITH SUPPER 90 tablet 3   rizatriptan (MAXALT) 10 MG tablet TAKE 1 TABLET BY MOUTH AS NEEDED FOR MIGRAINE. MAY REPEAT IN 2 HOURS IF NEEDED 30 tablet 11   saccharomyces boulardii (FLORASTOR) 250 MG capsule Take 250 mg by mouth every morning.       sodium chloride (OCEAN) 0.65 % SOLN nasal spray Place 1 spray into both nostrils 2 (two) times daily.       Testosterone Cypionate 200 MG/ML SOLN Inject 200 mg into the muscle See admin instructions. Inject 200 mg (1 ml) intramuscularly once every 7-10 days       venlafaxine XR (EFFEXOR-XR) 75 MG 24 hr capsule TAKE 1 CAPSULE(75 MG) BY MOUTH DAILY WITH BREAKFAST 90 capsule 1   zolpidem (AMBIEN) 10 MG tablet Take 10 mg by mouth at bedtime as needed for sleep.                   Current Facility-Administered Medications  Medication Dose Route Frequency Provider Last Rate Last Admin   methylPREDNISolone acetate (DEPO-MEDROL) injection (RADIOLOGY ONLY) 120 mg  120 mg Intramuscular Once Omar Person, MD            Atrial Fibrillation Management history:   Previous antiarrhythmic drugs: amiodarone - tongue swelling Previous cardioversions: 11/28/21 Previous  ablations: None Anticoagulation history: Xarelto     ROS- All systems are reviewed and negative except as per the HPI above.   Physical Exam: BP (!) 150/84   Pulse 85   Ht 5\' 10"  (1.778 m)   Wt 105.7 kg   BMI 33.43 kg/m    GEN: Well nourished, well developed in no acute distress NECK: No JVD; No carotid bruits CARDIAC: Irregularly irregular rate and rhythm, no murmurs, rubs, gallops RESPIRATORY:  Clear to auscultation without rales, wheezing or rhonchi  ABDOMEN: Soft, non-tender, non-distended EXTREMITIES:  No edema; No deformity    EKG today demonstrates  Vent. rate 85 BPM PR interval * ms QRS duration 128 ms QT/QTcB 396/471 ms P-R-T axes * 0 211 Atrial fibrillation Non-specific intra-ventricular conduction block Minimal voltage criteria for LVH, may be normal variant ( Cornell product ) Nonspecific T wave abnormality Abnormal ECG When compared with ECG of 08-Jul-2023 16:48, PREVIOUS ECG IS PRESENT   Echo 03/05/23 demonstrated   1. Left ventricular ejection fraction, by estimation, is 55 to 60%. The  left ventricle has normal function. The left ventricle has no regional  wall motion abnormalities. There is mild left ventricular hypertrophy.  Left ventricular diastolic parameters  were normal.   2. Right ventricular systolic function is normal. The right ventricular  size is normal. There is moderately elevated pulmonary artery systolic  pressure.   3. Left atrial size was moderately dilated.   4. The mitral valve is abnormal. Trivial mitral valve regurgitation. No  evidence of mitral stenosis.   5. 25 mm Edwards bioprosthetic valve mild calcification of leaflets  gradients stable  since prior TTE cone 02/28/22 . The aortic valve has been  repaired/replaced. Aortic valve regurgitation is not visualized. No aortic  stenosis is present. There is a 25  mm Edwards bioprosthetic valve present in the aortic position. Procedure  Date: 10/28/21.   6. Aortic dilatation noted.  There is mild dilatation of the aortic root,  measuring 38 mm.   7. The inferior vena cava is normal in size with greater than 50%  respiratory variability, suggesting right atrial pressure of 3 mmHg.    ASSESSMENT & PLAN CHA2DS2-VASc Score = 5  The patient's score is based upon: CHF History: 0 HTN History: 1 Diabetes History: 0 Stroke History: 2 Vascular Disease History: 1 Age Score: 1 Gender Score: 0         ASSESSMENT AND PLAN: Persistent Atrial Fibrillation (ICD10:  I48.19) The patient's CHA2DS2-VASc score is 5, indicating a 7.2% annual risk of stroke.     He is in rate controlled Afib. Review of records show he underwent successful 360J cardioversion 11/2021. After discussion of options for rhythm control including mainly Tikosyn (history of tongue swelling on amiodarone), patient would like to proceed with cardioversion only for now. I did advise wife and patient if he has ERAF, will then need to revisit AAD therapy for long term rhythm control. He states he would rather re-try amiodarone versus hospital admission for Tikosyn. Labs obtained today. Will discuss with Cardiology for patient to undergo cardioversion with 360J defibrillator as done last year. Previous BNP was elevated significantly at 2,186 on 9/18. Will reassess after lasix regimen prescribed by Dr. Eldridge Dace.      Informed Consent Shared Decision Making/Informed Consent The risks (stroke, cardiac arrhythmias rarely resulting in the need for a temporary or permanent pacemaker, skin irritation or burns and complications associated with conscious sedation including aspiration, arrhythmia, respiratory failure and death), benefits (restoration of normal sinus rhythm) and alternatives of a direct current cardioversion were explained in detail to Mr. Akel and he agrees to proceed.        Secondary Hypercoagulable State (ICD10:  D68.69) The patient is at significant risk for stroke/thromboembolism based upon his  CHA2DS2-VASc Score of 5.  Continue Rivaroxaban (Xarelto).  No missed doses.      Follow up as scheduled with Dr. Eden Emms.      Lake Bells, PA-C  Afib Clinic [facility] [address] [place], [place] [address] [phone]       For DCCV; compliant with xarelto; no changes [name]

## 2023-07-23 NOTE — Interval H&P Note (Signed)
History and Physical Interval Note:  07/23/2023 11:36 AM  Grant [name]  has presented today for surgery, with the diagnosis of AFIB.  The various methods of treatment have been discussed with the patient and family. After consideration of risks, benefits and other options for treatment, the patient has consented to  Procedure(s): CARDIOVERSION (N/A) as a surgical intervention.  The patient's history has been reviewed, patient examined, no change in status, stable for surgery.  I have reviewed the patient's chart and labs.  Questions were answered to the patient's satisfaction.     [name]

## 2023-07-23 NOTE — Procedures (Signed)
Electrical Cardioversion Procedure Note Grant [name] [id number] [birth year]  Procedure: Electrical Cardioversion Indications:  Atrial Fibrillation  Procedure Details Consent: Risks of procedure as well as the alternatives and risks of each were explained to the (patient/caregiver).  Consent for procedure obtained. Time Out: Verified patient identification, verified procedure, site/side was marked, verified correct patient position, special equipment/implants available, medications/allergies/relevent history reviewed, required imaging and test results available.  Performed  Patient placed on cardiac monitor, pulse oximetry, supplemental oxygen as necessary.  Sedation given:  Pt sedated by anesthesia with diprovan 100 mg IV. Pacer pads placed anterior and posterior chest.  Cardioverted 1 time(s).  Cardioverted at 360J.  Evaluation Findings: Post procedure EKG shows: NSR Complications: None Patient did tolerate procedure well.   Grant [name] 07/23/2023, 11:35 AM

## 2023-07-23 NOTE — Transfer of Care (Signed)
Immediate Anesthesia Transfer of Care Note  Patient: Grant [name]  Procedure(s) Performed: CARDIOVERSION  Patient Location: Cath Lab  Anesthesia Type:General  Level of Consciousness: awake, alert , and oriented  Airway & Oxygen Therapy: Patient Spontanous Breathing and Patient connected to nasal cannula oxygen  Post-op Assessment: Report given to RN, Post -op Vital signs reviewed and stable, Patient moving all extremities X 4, and Patient able to stick tongue midline  Post vital signs: Reviewed and stable  Last Vitals:  Vitals Value Taken Time  BP 120/95 07/23/23 1215  Temp 98.6   Pulse 77 07/23/23 1218  Resp 18 07/23/23 1220  SpO2 97 % 07/23/23 1218  Vitals shown include unfiled device data.  Last Pain:  Vitals:   07/23/23 1120  TempSrc: Temporal         Complications: No notable events documented.

## 2023-07-23 NOTE — Anesthesia Preprocedure Evaluation (Signed)
Anesthesia Evaluation  Patient identified by MRN, date of birth, ID band Patient awake    Reviewed: Allergy & Precautions, NPO status , Patient's Chart, lab work & pertinent test results  Airway Mallampati: III  TM Distance: >3 FB Neck ROM: Full    Dental no notable dental hx.    Pulmonary asthma , sleep apnea and Continuous Positive Airway Pressure Ventilation , former smoker   Pulmonary exam normal        Cardiovascular hypertension, Pt. on medications and Pt. on home beta blockers + CAD  + dysrhythmias Atrial Fibrillation  Rhythm:Irregular Rate:Normal     Neuro/Psych  Headaches  Anxiety Depression    CVA, Residual Symptoms    GI/Hepatic Neg liver ROS,GERD  ,,  Endo/Other  Hypothyroidism    Renal/GU      Musculoskeletal  (+) Arthritis ,    Abdominal Normal abdominal exam  (+)   Peds  Hematology  (+) Blood dyscrasia, anemia   Anesthesia Other Findings   Reproductive/Obstetrics                             Anesthesia Physical Anesthesia Plan  ASA: 3  Anesthesia Plan: General   Post-op Pain Management:    Induction: Intravenous  PONV Risk Score and Plan: 2 and Treatment may vary due to age or medical condition  Airway Management Planned: Simple Face Mask and Nasal Cannula  Additional Equipment: None  Intra-op Plan:   Post-operative Plan:   Informed Consent: I have reviewed the patients History and Physical, chart, labs and discussed the procedure including the risks, benefits and alternatives for the proposed anesthesia with the patient or authorized representative who has indicated his/her understanding and acceptance.     Dental advisory given  Plan Discussed with: CRNA  Anesthesia Plan Comments:        Anesthesia Quick Evaluation

## 2023-07-24 ENCOUNTER — Encounter (HOSPITAL_COMMUNITY): Payer: Self-pay | Admitting: Cardiology

## 2023-07-27 ENCOUNTER — Ambulatory Visit: Payer: Medicare Other | Admitting: Internal Medicine

## 2023-07-28 ENCOUNTER — Encounter: Payer: Self-pay | Admitting: Internal Medicine

## 2023-07-28 ENCOUNTER — Ambulatory Visit: Payer: Medicare Other | Admitting: Internal Medicine

## 2023-07-28 ENCOUNTER — Telehealth: Payer: Self-pay | Admitting: Internal Medicine

## 2023-07-28 VITALS — BP 131/94 | HR 95 | Temp 97.4°F

## 2023-07-28 DIAGNOSIS — J45991 Cough variant asthma: Secondary | ICD-10-CM | POA: Diagnosis not present

## 2023-07-28 DIAGNOSIS — G4733 Obstructive sleep apnea (adult) (pediatric): Secondary | ICD-10-CM | POA: Diagnosis not present

## 2023-07-28 DIAGNOSIS — K219 Gastro-esophageal reflux disease without esophagitis: Secondary | ICD-10-CM

## 2023-07-28 DIAGNOSIS — I4819 Other persistent atrial fibrillation: Secondary | ICD-10-CM | POA: Diagnosis not present

## 2023-07-28 DIAGNOSIS — J410 Simple chronic bronchitis: Secondary | ICD-10-CM | POA: Diagnosis not present

## 2023-07-28 MED ORDER — METHYLPREDNISOLONE ACETATE 80 MG/ML IJ SUSP
120.0000 mg | Freq: Once | INTRAMUSCULAR | Status: AC
Start: 2023-07-28 — End: 2023-07-28
  Administered 2023-07-28: 120 mg via INTRAMUSCULAR

## 2023-07-28 MED ORDER — AMOXICILLIN-POT CLAVULANATE 875-125 MG PO TABS: 1.0000 | ORAL_TABLET | Freq: Two times a day (BID) | ORAL | 0 refills | Status: DC

## 2023-07-28 NOTE — Telephone Encounter (Signed)
Ok to use held spot this week

## 2023-07-28 NOTE — Telephone Encounter (Signed)
Spoke to patient's spouse, Kathy(DPR)  Patient has been experiencing prod cough with gray sputum occ mixed with strings of bright red blood x10mo. Denied f/c/s or additional sx.  SOB has slightly increased. Recent cardioversion.  Olegario Messier is concerned about PNA or pleural effusion. She would like appt with Dr. Maple Hudson.   He is using albuterol solution once daily, albuterol HFA QID, Symbicort BID and lasix 40mg  BID. No recent abx or prednisone.   Dr. Maple Hudson, please advise. Thanks  Current Outpatient Medications on File Prior to Visit  Medication Sig Dispense Refill   acetaminophen (TYLENOL) 500 MG tablet Take 1,000 mg by mouth every 8 (eight) hours as needed for headache or moderate pain.     albuterol (VENTOLIN HFA) 108 (90 Base) MCG/ACT inhaler INHALE 2 PUFFS INTO THE LUNGS EVERY 6 HOURS AS NEEDED FOR WHEEZING OR SHORTNESS OF BREATH 18 g 12   ALPRAZolam (XANAX) 0.25 MG tablet Take 1 tablet (0.25 mg total) by mouth 3 (three) times daily as needed for anxiety. 15 tablet 0   aluminum hydroxide-magnesium carbonate (GAVISCON) 95-358 MG/15ML SUSP Take 15 mLs by mouth 3 (three) times daily as needed for indigestion or heartburn.     azelastine (ASTELIN) 137 MCG/SPRAY nasal spray Place 1 spray into the nose 2 (two) times daily as needed for rhinitis or allergies.     benzonatate (TESSALON) 200 MG capsule Take 200 mg by mouth 3 (three) times daily as needed for cough.     budesonide-formoterol (SYMBICORT) 160-4.5 MCG/ACT inhaler INHALE 2 PUFFS INTO THE LUNGS IN THE MORNING AND AT BEDTIME 10.2 g 12   cetirizine (ZYRTEC) 10 MG tablet Take 10 mg by mouth every morning.     colchicine (COLCRYS) 0.6 MG tablet Take 0.6 mg by mouth daily.     dextromethorphan (DELSYM) 30 MG/5ML liquid Take 30 mg by mouth daily as needed for cough.     diclofenac Sodium (VOLTAREN) 1 % GEL Apply 2 g topically 4 (four) times daily as needed (to affected areas- for arthritis pain.).     diphenhydrAMINE (BENADRYL) 25 mg capsule Take 50  mg by mouth at bedtime as needed for allergies.     Erenumab-aooe (AIMOVIG) 140 MG/ML SOAJ Inject 140 mg into the skin every 28 (twenty-eight) days. 1 mL 11   Ferrous Sulfate (IRON) 142 (45 Fe) MG TBCR Take 142 mg by mouth in the morning and at bedtime.     Fiber 500 MG CAPS Take 1,000 mg by mouth in the morning.     finasteride (PROSCAR) 5 MG tablet Take 5 mg by mouth at bedtime.      furosemide (LASIX) 40 MG tablet Take 1 tablet (40 mg total) by mouth daily. 30 tablet 3   Guaifenesin (MUCINEX MAXIMUM STRENGTH) 1200 MG TB12 Take 1,200 mg by mouth 2 (two) times daily as needed (cough).     ibuprofen (ADVIL) 200 MG tablet Take 400 mg by mouth 2 (two) times daily as needed for headache (or pain).     ipratropium (ATROVENT) 0.06 % nasal spray Place 2 sprays into both nostrils 2 (two) times daily.     ipratropium-albuterol (DUONEB) 0.5-2.5 (3) MG/3ML SOLN Take 3 mLs by nebulization every 6 (six) hours as needed. 360 mL 3   levothyroxine (SYNTHROID, LEVOTHROID) 112 MCG tablet Take 112 mcg by mouth daily before breakfast.      liothyronine (CYTOMEL) 25 MCG tablet Take 25 mcg by mouth daily before breakfast.     losartan (COZAAR) 50 MG tablet TAKE 1 TABLET  BY MOUTH EVERY DAY 30 tablet 6   Menthol, Topical Analgesic, (BLUE-EMU MAXIMUM STRENGTH EX) Apply 1 application topically 3 (three) times daily as needed (arthritis pain.).     methocarbamol (ROBAXIN) 750 MG tablet Take 1 tablet (750 mg total) by mouth 4 (four) times daily as needed (use for muscle cramps/pain). 30 tablet 2   metoprolol tartrate (LOPRESSOR) 50 MG tablet TAKE 1 TABLET(50 MG) BY MOUTH TWICE DAILY 180 tablet 3   Multiple Vitamin (MULTIVITAMIN WITH MINERALS) TABS tablet Take 1 tablet by mouth daily with breakfast.     naproxen sodium (ALEVE) 220 MG tablet Take 220 mg by mouth 2 (two) times daily as needed (pain/headache).     olopatadine (PATANOL) 0.1 % ophthalmic solution Place 1 drop into both eyes 2 (two) times daily as needed for  allergies.     omeprazole (PRILOSEC) 40 MG capsule Take 1 capsule (40 mg total) by mouth 2 (two) times daily. 180 capsule 1   PRESCRIPTION MEDICATION See admin instructions. CPAP- At bedtime     rivaroxaban (XARELTO) 20 MG TABS tablet TAKE 1 TABLET(20 MG) BY MOUTH DAILY WITH SUPPER 90 tablet 3   rizatriptan (MAXALT) 10 MG tablet TAKE 1 TABLET BY MOUTH AS NEEDED FOR MIGRAINE. MAY REPEAT IN 2 HOURS IF NEEDED 30 tablet 11   saccharomyces boulardii (FLORASTOR) 250 MG capsule Take 250 mg by mouth every morning.     sodium chloride (OCEAN) 0.65 % SOLN nasal spray Place 1 spray into both nostrils 2 (two) times daily.     Testosterone Cypionate 200 MG/ML SOLN Inject 200 mg into the muscle See admin instructions. Inject 200 mg (1 ml) intramuscularly once every 7-10 days     venlafaxine XR (EFFEXOR-XR) 75 MG 24 hr capsule TAKE 1 CAPSULE(75 MG) BY MOUTH DAILY WITH BREAKFAST 90 capsule 1   zolpidem (AMBIEN) 10 MG tablet Take 10 mg by mouth at bedtime as needed for sleep.     Current Facility-Administered Medications on File Prior to Visit  Medication Dose Route Frequency Provider Last Rate Last Admin   methylPREDNISolone acetate (DEPO-MEDROL) injection (RADIOLOGY ONLY) 120 mg  120 mg Intramuscular Once Omar Person, MD        Allergies  Allergen Reactions   Annamaria Helling Tosylate] Other (See Comments)    PVC's   Perphenazine Swelling and Other (See Comments)    Tongue swelling   Amantadines     Feels bad   Amiodarone Swelling and Other (See Comments)    Tongue swelling   Codeine Itching   Levaquin [Levofloxacin] Other (See Comments)    malaise   Prednisone Itching and Other (See Comments)    CAN TOLERATE DEPO. PREDNISONE BY MOUTH CAUSES MOOD CHANGES, INSOMNIA, AND ITCHING.    Tramadol Other (See Comments)    Delusions/hallucinations from high doses

## 2023-07-28 NOTE — Telephone Encounter (Signed)
Spoke to patient's spouse, Kathy(DPR) and scheduled appt with Dr. Maple Hudson for 07/28/2023 at 4:30(held slot). She voiced her understanding.  Nothing further needed.

## 2023-07-28 NOTE — Patient Instructions (Signed)
Order- depomedrol 120 mg      dx exacerbation chronic bronchitis  Script sent for augmentin

## 2023-07-28 NOTE — Progress Notes (Signed)
History of Present Illness: . male former smoker with asthma/chronic bronchitis and allergic rhinitis, OSA PAF/ anticoagulation, aortic valve replacement, toxic encephalopathy, light sensitivity, chronic fatigue/fibromyalgia NPSG 03/24/03- AHI 88/ hr, desaturation to 75%, body weight 244 lbs PFT 06/01/17-WNL-FVC 4.46/96%, FEV1 3.71/106%, ratio 0.83, FEF 25-75% 4.64/167%, no response to dilator, TLC 107%, DLCO 101%  PFT 07/07/22- ------------------------------------------------------------------   06/25/23-72 year old male former smoker (13.5 pkyrs) followed for Asthma/chronic Bronchitis and allergic Rhinitis,  OSA, Lung Nodule,  complicated by PAF/ Xarelto, Aortic Valve Replacement,/ IE abscess, Toxic Encephalopathy, Light sensitivity, Chronic Fatigue/fibromyalgia, CVA, Post-stroke chorea,  -Flutter valve, Symbicort 160, Astelin nasal, tessalon, singulair, Ventolin hfa,  CPAP   14/Adapt   AirSense 10 AutoSet    - replacement and change to auto5-15 ordered 03/04/22 Download compliance- 100%, AHI 7/hr Body weight today-237 lbs Hosp [facility]- BPH/ urinary obstruction -----Patient states he is not been doing well.   Wife here -----Complains of cough, fatigue and chest congestion Report 3 different antibiotics since June. Levaquin>>Malaise.  Augmentin, levaqin then cefuroxime. Coughing, can't get his breath, throat clearing. Worse past 2 weeks. Sputum may be colored. Rests and breathes better on CPAP, so going on it early. Covid test neg. Wife reports some sort of respiratory distress on and off since June. [facility] test neg on 8/13 for Covid-RSV-Flu. More cough and more productive last 2 weeks, throat clearing, can't catch breath. Helps to go onto his CPAP. No fever, some cold sweats. Trying probiotic to help GI from antibiotics. Avoids cough drops- afraid he will inhale them. Augmentin may have helped most. CTachest 06/01/23- IMPRESSION: 1. No evidence of thoracic aortic aneurysm. 2. Postsurgical change  consistent aortic valve replacement. 3. Left part-solid pulmonary nodule within the upper lobe measuring 7 mm. Per Fleischner Society Guidelines, recommend a non-contrast Chest CT at 3-6 months to confirm persistence. If unchanged and the solid component remains < 6 mm, an annual non-contrast Chest CT should be performed for 5 years. If the solid component is 6-8 mm on follow-up, recommend biopsy or resection. If the solid component is > 8 mm on follow-up, recommend PET/CT, biopsy or resection. These guidelines do not apply to immunocompromised patients and patients with cancer. Follow up in patients with significant comorbidities as clinically warranted. For lung cancer screening, adhere to Lung-RADS guidelines. Reference: Radiology. 2017; 284(1):228-43.  07/28/23- 72 year old male former smoker (13.5 pkyrs) followed for Asthma/chronic Bronchitis and allergic Rhinitis,  OSA, Lung Nodule,  complicated by PAF/ Xarelto, Aortic Valve Replacement,/ IE abscess, Toxic Encephalopathy, Light sensitivity, Chronic Fatigue/fibromyalgia, CVA, Post-stroke chorea ( Is cough a tic?), GERD,  -Flutter valve, Symbicort 160, Astelin nasal, tessalon, singulair, Ventolin hfa,  CPAP  still on  14/Adapt   AirSense 10 AutoSet   Need to make this change. - replacement and change to auto5-15 ordered 03/04/22 but not done. Download compliance- 100%, AHI 8.9/hr Body weight today- Wife brings him back. Cough is worse- some mucus and few blood  streaks. CPAP helps his breathing and cough so he puts it on sometimes during day. Cardiology put him on lasix- he thinks it might help cough a little. Hx cardioversion.  ROS-see HPI     + = positive Constitutional:   No-   weight loss, night sweats, fevers, chills, fatigue, lassitude. HEENT:   No-  headaches, difficulty swallowing, tooth/dental problems,  sore throat,       No-  sneezing, itching, ear ache,                    No-nasal  congestion, + post nasal drip,  CV:   +chest pain, orthopnea, PND, swelling in lower extremities, anasarca,  dizziness, palpitations Resp: No- coughing up of blood.               change in color of mucus.  No- wheezing.   Skin: Clear GI:   heartburn, indigestion, abdominal pain, nausea, vomiting,  GU:  MS:  No-   joint pain or swelling.   Neuro-     nothing unusual Psych:  No- change in mood or affect. + depression or anxiety.  No memory loss.  OBJ- Physical Exam Sunglasses, fidgeting/ restless General- Alert, Oriented, Affect-appropriate, Distress- none acute. +fidgeting, restless, sunglasses Skin- +eczematoid patches on lower calves, nonspecific, excoriated Lymphadenopathy- none Head- atraumatic. Stares aimlessly around room like a blind person.             Eyes- Gross vision intact, PERRLA, conjunctivae and secretions clear,  + sunglasses            Ears- Hearing, canals-normal            Nose- Clear, no-Septal dev, mucus, polyps, erosion, perforation             Throat- Mallampati II-III, mucosa , drainage- none, tonsils- atrophic Neck- flexible , trachea midline, no stridor , thyroid nl, carotid no bruit Chest - symmetrical excursion , unlabored           Heart/CV- ?RRR ,  murmur+2-3/6 S, no gallop  , no rub, nl s1 s2,                            - JVD- none , edema- none, stasis changes- none, varices- none           Lung- + clear/ unlabored, wheeze- none, cough+dry/ intense, dullness-none, rub- none           Chest wall-  Abd-  Br/ Gen/ Rectal- Not done, not indicated Extrem- cyanosis- none, clubbing, none, atrophy- none, strength- nl Neuro- +constant movement of legs/ chorea

## 2023-07-28 NOTE — Telephone Encounter (Signed)
[name] calling. Wants a appt w/Dr. Jeannie Fend and only Dr. Maple Hudson for some issues he is having. Refused appt w/any other provider. Would like to speak to the nurse because he has been worked in before.   He currently has a bad cough, coughing blood mucous is gray, continuous cough,fluid in lungs, she said.   [phone]

## 2023-07-29 ENCOUNTER — Encounter: Payer: Self-pay | Admitting: Internal Medicine

## 2023-07-30 ENCOUNTER — Ambulatory Visit (HOSPITAL_COMMUNITY)
Admission: RE | Admit: 2023-07-30 | Discharge: 2023-07-30 | Disposition: A | Discharge status: Home / Self Care | Payer: Medicare Other | Source: Ambulatory Visit | Attending: Internal Medicine | Admitting: Internal Medicine

## 2023-07-30 VITALS — BP 162/100 | HR 62 | Ht 70.0 in | Wt 227.2 lb

## 2023-07-30 DIAGNOSIS — Z8673 Personal history of transient ischemic attack (TIA), and cerebral infarction without residual deficits: Secondary | ICD-10-CM | POA: Diagnosis not present

## 2023-07-30 DIAGNOSIS — Z7901 Long term (current) use of anticoagulants: Secondary | ICD-10-CM | POA: Insufficient documentation

## 2023-07-30 DIAGNOSIS — R9431 Abnormal electrocardiogram [ECG] [EKG]: Secondary | ICD-10-CM | POA: Insufficient documentation

## 2023-07-30 DIAGNOSIS — I10 Essential (primary) hypertension: Secondary | ICD-10-CM | POA: Diagnosis not present

## 2023-07-30 DIAGNOSIS — I4819 Other persistent atrial fibrillation: Secondary | ICD-10-CM | POA: Insufficient documentation

## 2023-07-30 DIAGNOSIS — I480 Paroxysmal atrial fibrillation: Secondary | ICD-10-CM | POA: Diagnosis not present

## 2023-07-30 DIAGNOSIS — D6869 Other thrombophilia: Secondary | ICD-10-CM | POA: Insufficient documentation

## 2023-07-30 NOTE — Progress Notes (Signed)
Date:  08/11/2023   ID:  Grant [name], DOB 1951-08-07, MRN [id number]  PCP:  Noberto Retort, MD  Cardiologist: Dr. Eden Emms  EP: Dr. Ladona Ridgel  Chief Complaint: PAF/AVR   History of Present Illness:   72 y.o. history of PAF, HTN, OSA on CPAP with AVR PVT  for bicuspid valve 2018 Bentall  (25mm Edwards pericardial Magna Ease)  No CAD at cath done 06/12/17 prior to surgery   Had recurrent PAF 07/28/17 converted with iv cardizem and seen by EP Dr Ladona Ridgel who recommended amiodarone 200 mg daily and changed back to  xarelto for anticoagulation   History of  angioedema. 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  A gated cardiac CTA was also suspicious but felt that circumferential thickening could be related to pledget material from the time of surgery.  D/c after prolonged hospital stay 11/05/21 Developed PAF/flutter around the time of PIC line change CXR showed PIC line at cavoatrial junction Rx xarelto and lopressor for rate control He was cardioverted in the EP lab by Dr Lalla Brothers on 11/28/21   Called office 02/28/22 postural paleness and dizzy? Low pulse BP ok oxygen 70% range per wife Improved wearing his CPAP Rx with doxycycline from time/time for bronchitis   06/11/22 had hernia surgery with Dr Coralee Pesa held with no  issues  Seen in ED 09/11/22 with blurred vision and in office today has had tardive dyskinesia which is quite new and prominent today. He has lip smacking and constant UE/LE movements Med list does not reveal and obvious culprit CT in ER only with old lacunar infarct no acute changes  Seen by neuro and MRI no acute findings Huntington Dx panel negative Tried on Ingrezza but did not tolerate ? 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 He is congested with increased tics He may need steroids but does not tolerate prednison not sure if he has ever had Medrol Has been on 3 different antibiotics since June including Augmentin, Lavaquin and Cefuroxime. Has not had recent RSV or flu test     Past Medical History:  Diagnosis Date   Allergic rhinitis    Anxiety    Arthritis    Asthma    Bladder tumor    Chronic fatigue    COVID-19 05/22/2021   Depression    06/30/17 Pt denies being depressed, reports Effexor is taken for Chronic Fatigue    Dyspnea  Enlarged prostate    Fibromyalgia    GERD (gastroesophageal reflux disease)    History of chronic bronchitis    History of migraine    light and sound sensitive   History of toxic encephalopathy    Hypothyroidism    OSA on CPAP    CPAP 14   PAF (paroxysmal atrial fibrillation) (HCC) CARDIOLOGIST -- DR Eden Emms   DX OCT 2013   Pneumonia 2023   S/P AVR (aortic valve replacement) and aortoplasty    Stroke [facility])    has post stroke chorea   Unspecified essential hypertension    Urethral tumor    PROSTATIC    Past Surgical History:  Procedure Laterality Date   BENTALL PROCEDURE N/A 07/02/2017    Procedure: BENTALL PROCEDURE;  Surgeon: Kerin Perna, MD;  Location[facility] OR;  Service: Open Heart Surgery;  Laterality: N/A;  WITH CIRC ARREST   BENTALL PROCEDURE  07/02/2017   w/ Aortic valve replacement    by Dr. Donata Clay   BIOPSY  02/06/2021   Procedure: BIOPSY;  Surgeon: Vida Rigger, MD;  Location: [facility] ENDOSCOPY;  Service: Endoscopy;;   BUBBLE STUDY  11/01/2021   Procedure: BUBBLE STUDY;  Surgeon: Sande Rives, MD;  Location: [facility] ENDOSCOPY;  Service: Cardiovascular;;   CARDIOVERSION N/A 11/28/2021   Procedure: CARDIOVERSION (CATH LAB);  Surgeon: Lanier Prude, MD;  Location: [facility] INVASIVE CV LAB;  Service: Cardiovascular;  Laterality: N/A;   CARDIOVERSION N/A 07/23/2023   Procedure: CARDIOVERSION;  Surgeon: Lewayne Bunting, MD;  Location: [facility] INVASIVE CV LAB;  Service: Cardiovascular;  Laterality: N/A;   COLONOSCOPY     COLONOSCOPY WITH PROPOFOL N/A 02/06/2021   Procedure: COLONOSCOPY WITH PROPOFOL;  Surgeon: Vida Rigger, MD;  Location: [facility] ENDOSCOPY;  Service: Endoscopy;  Laterality: N/A;   CORONARY ANGIOPLASTY  06/12/2017   CYSTOSCOPY W/ RETROGRADES Bilateral 06/15/2013   Procedure: CYSTOSCOPY WITH BILATERAL RETROGRADE PYELOGRAM  BLADDER BIOPSY, PROSTATIC URETHRAL BIOPSY, ;  Surgeon: Milford Cage, MD;  Location: [facility];  Service: Urology;  Laterality: Bilateral;   ESOPHAGOGASTRODUODENOSCOPY (EGD) WITH PROPOFOL N/A 03/11/2018   Procedure: ESOPHAGOGASTRODUODENOSCOPY (EGD) WITH PROPOFOL;  Surgeon: Vida Rigger, MD;  Location: [facility] ENDOSCOPY;  Service: Endoscopy;  Laterality: N/A;   EXCISION OF MESH N/A 06/11/2022   Procedure: REMOVAL OF OLD MESH;  Surgeon: Karie Soda, MD;  Location: [facility] ORS;  Service: General;  Laterality: N/A;   LAPAROSCOPIC CHOLECYSTECTOMY  01/14/2001   PROCTOSCOPY N/A 06/12/2021   Procedure: RIGID PROCTOSCOPY;  Surgeon: Karie Soda, MD;  Location: [facility] ORS;  Service: General;  Laterality: N/A;   RIGHT/LEFT HEART CATH AND CORONARY  ANGIOGRAPHY N/A 06/12/2017   Procedure: RIGHT/LEFT HEART CATH AND CORONARY ANGIOGRAPHY;  Surgeon: Laurey Morale, MD;  Location: [facility] INVASIVE CV LAB;  Service: Cardiovascular;  Laterality: N/A;   TEE WITHOUT CARDIOVERSION N/A 07/02/2017   Procedure: TRANSESOPHAGEAL ECHOCARDIOGRAM (TEE);  Surgeon: Donata Clay, Theron Arista, MD;  Location: [facility] OR;  Service: Open Heart Surgery;  Laterality: N/A;   TEE WITHOUT CARDIOVERSION N/A 11/01/2021   Procedure: TRANSESOPHAGEAL ECHOCARDIOGRAM (TEE);  Surgeon: Sande Rives, MD;  Location: [facility] ENDOSCOPY;  Service: Cardiovascular;  Laterality: N/A;   UMBILICAL HERNIA REPAIR  01/14/2008   VENTRAL HERNIA REPAIR N/A 06/11/2022   Procedure: LAPAROSCOPIC VENTRAL HERNIA WITH LYSIS OF ADHESIONS;  Surgeon: Karie Soda, MD;  Location: [facility] ORS;  Service: General;  Laterality: N/A;  WITH MESH   WISDOM TOOTH EXTRACTION      Current Medications: Prior to Admission medications  Medication Sig Start Date End Date Taking? Authorizing Provider  acetaminophen (TYLENOL) 500 MG tablet Take 500 mg by mouth every 6 (six) hours as needed for moderate pain.    [provider]  albuterol (VENTOLIN HFA) 108 (90 Base) MCG/ACT inhaler INHALE 2 PUFFS INTO LUNGS EVERY 6 HOURS AS NEEDED FOR WHEEZING OR SHORTNESS OF BREATH 01/19/17   Jetty Duhamel D, MD  amiodarone (PACERONE) 200 MG tablet Take 1 tablet (200 mg total) by mouth daily. 07/29/17   Manson Passey, PA  aspirin 81 MG EC tablet Take 1 tablet (81 mg total) by mouth daily. 07/13/17   Gold, Glenice Laine, PA-C  azelastine (ASTELIN) 137 MCG/SPRAY nasal spray Place 1-2 sprays into the nose 2 (two) times daily as needed for rhinitis or allergies.     [provider]  benzonatate (TESSALON) 200 MG capsule Take 1 capsule (200 mg total) by mouth every 6 (six) hours as needed for cough. 11/19/16   Jetty Duhamel D, MD  budesonide-formoterol (SYMBICORT) 160-4.5 MCG/ACT inhaler INHALE 2 PUFF INTO THE LUNGS 2 TIMES A DAY. 01/19/17    Jetty Duhamel D, MD  Cholecalciferol (VITAMIN D-3) 5000 UNITS TABS Take 5,000 Units by mouth daily with breakfast.     [provider]  COLCRYS 0.6 MG tablet Take 0.6 mg by mouth daily. With breakfast 01/29/12   [provider]  Cyanocobalamin (VITAMIN B12 SL) Place 1,200 mcg under the tongue daily. 1 dropper    [provider]  dextromethorphan (DELSYM) 30 MG/5ML liquid Take 30 mg by mouth at bedtime as needed for cough.     [provider]  DHEA 50 MG TABS Take 50 mg by mouth daily with breakfast.     [provider]  fexofenadine (ALLEGRA) 180 MG tablet Take 180 mg by mouth daily with breakfast.     [provider]  finasteride (PROSCAR) 5 MG tablet Take 5 mg by mouth at bedtime.  01/14/14   [provider]  Hypromellose Cloyd Stagers ALLERGY NASAL SPRAY NA) Place 1 spray into both nostrils daily as needed (for allergies).     [provider]  levothyroxine (SYNTHROID, LEVOTHROID) 112 MCG tablet Take 112 mcg by mouth daily before breakfast.     [provider]  liothyronine (CYTOMEL) 25 MCG tablet Take 25 mcg by mouth every morning.     [provider]  magic mouthwash w/lidocaine SOLN Take 10 mLs by mouth 4 (four) times daily as needed for mouth pain. 07/16/17   Waymon Budge, MD  Menthol, Topical Analgesic, (BLUE-EMU MAXIMUM STRENGTH EX) Apply 1 application topically 4 (four) times daily as needed (for arthritis pain.).    [provider]  metoprolol tartrate (LOPRESSOR) 50 MG tablet Take 1 tablet (50 mg total) by mouth 2 (two) times daily. 07/29/17   Bhagat, Bhavinkumar, PA  milk thistle 175 MG tablet Take 175 mg by mouth daily.    [provider]  Multiple Vitamins-Minerals (ONE-A-DAY MENS HEALTH FORMULA PO) Take 1 tablet by mouth daily with breakfast.     [provider]  omeprazole (PRILOSEC) 40 MG capsule Take 1 capsule (40 mg total) by mouth 2 (two) times daily. 10/31/16   Wendall Stade, MD  perphenazine (TRILAFON) 2 MG tablet Take 2 mg by mouth 2 (two) times daily as needed (for migraines).     [provider]  Probiotic Product (ALIGN) 4 MG CAPS Take 4 mg by mouth daily with breakfast.     [provider]  tamsulosin (FLOMAX) 0.4 MG CAPS capsule Take 0.4 mg by mouth daily after supper.    [provider]  testosterone cypionate (DEPOTESTOTERONE CYPIONATE) 100 MG/ML injection Inject 200 mg into the muscle every Thursday. For IM use only     [provider]  tetrahydrozoline-zinc (VISINE-AC) 0.05-0.25 % ophthalmic solution Place 1 drop into both eyes as needed (dry eyes).    [provider]  venlafaxine (EFFEXOR-XR) 150 MG 24 hr capsule Take 150 mg by mouth every morning.     [provider]  warfarin (COUMADIN) 2.5 MG tablet Take 1 tablet (2.5 mg total) by mouth daily at 6 PM. Take 7.5mg  (3 tablet tonight) and 5mg  tomorrow and INR check Friday Patient taking differently: Take 2.5 mg by mouth daily at 6 PM. Take 7.5mg  (3 tablet tonight) and 5mg  tomorrow and INR check Friday and as directed 07/29/17   Manson Passey, PA    Allergies:   Ingrezza [valbenazine tosylate], Perphenazine, Amantadines, Amiodarone, Codeine, Levaquin [levofloxacin], Prednisone, and Tramadol   Social History   Socioeconomic History   Marital status: Married    Spouse name: Not on file   Number of children: Not on file   Years of education: Not on file   Highest education level: Not on file  Occupational History   Occupation: disabled    Comment: owner of buisness  Tobacco Use   Smoking status: Former    Current packs/day: 0.00    Average packs/day: 0.5 packs/day for 27.0 years (13.5 ttl pk-yrs)    Types: Cigarettes    Start date: 75    Quit date: 10/21/1979    Years since quitting: 43.8   Smokeless tobacco: Never  Vaping Use   Vaping status: Never Used  Substance and Sexual Activity   Alcohol use: Yes    Alcohol/week: 10.0  standard drinks of alcohol    Types: 4 Cans of beer, 6 Standard drinks or equivalent per week   Drug use: Never   Sexual activity: Not Currently  Other Topics Concern   Not on file  Social History Narrative   Right handed   One story home   Drinks no caffeine   Lives with wife    Social Determinants of Health   Financial Resource Strain: Unknown (11/22/2021)   Overall Financial Resource Strain (CARDIA)    Difficulty of Paying Living Expenses: Patient declined  Food Insecurity: No Food Insecurity (03/27/2023)   Hunger Vital Sign    Worried About Running Out of Food in the Last Year: Never true    Ran Out of Food in the Last Year: Never true  Transportation Needs: No Transportation Needs (03/27/2023)   PRAPARE - Administrator, Civil Service (Medical): No    Lack of Transportation (Non-Medical): No  Physical Activity: Unknown (11/22/2021)   Exercise Vital Sign    Days of Exercise per Week: 4 days    Minutes of Exercise per Session: Not on file  Stress: Unknown (11/22/2021)   [employer] of Occupational Health - Occupational Stress Questionnaire    Feeling of Stress : Patient declined  Social Connections: Unknown (11/22/2021)   Social Connection and Isolation Panel [NHANES]    Frequency of Communication with Friends and Family: More than three times a week    Frequency of Social Gatherings with Friends and Family: Once a week    Attends Religious Services: Patient declined    Database administrator or Organizations: No    Attends Banker Meetings: Not on  file    Marital Status: Married     Family History:  The patient's family history includes Aortic aneurysm (age of onset: 16) in his mother; Heart disease in an other family member; Other in his father.   ROS:   Please see the history of present illness.    ROS All other systems reviewed and are negative.   PHYSICAL EXAM:   VS:  There were no vitals taken for this visit.    Affect appropriate Obese  white male  HEENT: normal Neck supple with no adenopathy JVP normal no bruits no thyromegaly Lungs clear with no wheezing and good diaphragmatic motion Heart:  S1/S2 SEM no AR  murmur, no rub, gallop or click PMI normal post sternotomy  Abdomen: post umbilical hernia repair  Distal pulses intact with no bruits No edema Neuro non-focal Skin warm and dry No muscular weakness PIC line LUE with no edema or signs of phlebitis     Wt Readings from Last 3 Encounters:  07/30/23 227 lb 3.2 oz (103.1 kg)  07/23/23 222 lb (100.7 kg)  07/15/23 233 lb (105.7 kg)      Studies/Labs Reviewed:   EKG:   02/16/19 SR LAD nonspecific ST changes 08/11/2023 NSR rate 77 sinus arrhythmia stable 08/11/2023 SR rate 62 QRS 129 msec minimally prolonged no significant AV block   Recent Labs: 11/02/2022: Magnesium 1.7 06/27/2023: ALT 31; Hemoglobin 16.0; Platelets 124 07/08/2023: NT-Pro BNP 2,186 07/15/2023: B Natriuretic Peptide 445.9; BUN 13; Creatinine, Ser 1.03; Potassium 3.8; Sodium 136   Lipid Panel    Component Value Date/Time   CHOL 185 02/17/2019 0714   CHOL 184 05/05/2017 0931   TRIG 234 (H) 02/17/2019 0714   HDL 42 02/17/2019 0714   HDL 45 05/05/2017 0931   CHOLHDL 4.4 02/17/2019 0714   VLDL 47 (H) 02/17/2019 0714   LDLCALC 96 02/17/2019 0714   LDLCALC 94 05/05/2017 0931    Additional studies/ records that were reviewed today include:  As above   ASSESSMENT & PLAN:    1. PAF - on xarelto continue beta blocker off amiodarone now Post [facility] by Dr Lalla Brothers 11/28/21 and Dr Jens Som 07/30/23 Patient deferred AAT with Tikosyn as it requires hospitalization    2. HTN - Well controlled.  Continue current medications and low sodium Dash type diet.     3.  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 Donata Clay 07/02/17. TTE done 03/05/23 EF 55-60% mean gradient 26 peak 45 AVA 1.8 cm2 DVI 0.42 Gradients stable compared to  TTE done 02/28/22   4. OSA -Compliant with CPAP.  5. 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 F/U allergist cannot take oral steroids due to MS changes   6. Global Amnesia:  F/u neurology MRI/EEG ok ? Movement disorder improving etiology not clear   7. Bacteremia:  post 6 weeks antibiotics via PIC line no evidence of SBE on TEE/cardiac CTA f/u with ID    8. Pulmonary :  seeing Dr Maple Hudson has some bronchitis and ? Seasonal allergies leading to cough and congestion in throat Episode of hypoxia/bradycardia likely from mucous impaction as improved with CPAP  Now with worsening cough/bronchitis not responsive to antibiotics Needs CXR ? Non constrast CT will see Dr Maple Hudson latter this afternoon   9. Tardive Dyskinesia:  acute onset  around November on my visit ? 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 Prostatism:  post aqua ablative Rx  03/26/20 improved F/U Dr Ronne Binning   Refer back to pulmonary     F/U in 6 months      Charlton Haws

## 2023-07-30 NOTE — Progress Notes (Signed)
Primary Care Physician: Noberto Retort, MD Primary Cardiologist: Charlton Haws, MD Electrophysiologist: None     Referring Physician: Dr. Sandra Cockayne is a 72 y.o. 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 Seen by Dr. Eldridge Dace on 9/18 and noted to be in rate controlled Afib; lasix 40 mg daily added. Patient is on Xarelto for a CHADS2VASC score of 5.  On evaluation today, he is currently in rate controlled Afib. He notes temporary increase of lasix helped him lose significant weight. He has not missed any doses of Xarelto in the past 3 weeks.   On follow up 07/30/23, he is currently in NSR. S/p successful DCCV x 360J on 07/23/23. He has had no episodes of Afib since cardioversion. He was seen by Pulmonology 2 days ago and is now on Augmentin and received depo medrol injection. No missed doses of Xarelto.   Today, he denies symptoms of palpitations, chest pain, shortness of breath, orthopnea, PND, lower extremity edema, dizziness, presyncope, syncope, snoring, daytime somnolence, bleeding, or neurologic sequela. The patient is tolerating medications without difficulties and is otherwise without complaint today.    Atrial Fibrillation Risk Factors:  he does have symptoms or diagnosis of sleep apnea. he is compliant with CPAP therapy.  he has a BMI of Body mass index is 32.6 kg/m.Marland Kitchen Filed Weights   07/30/23 0926  Weight: 103.1 kg     Current Outpatient Medications  Medication Sig Dispense Refill   acetaminophen (TYLENOL) 500 MG tablet Take 1,000 mg by mouth every 8 (eight) hours as needed for headache or moderate pain.     albuterol (VENTOLIN HFA) 108 (90 Base) MCG/ACT inhaler INHALE 2 PUFFS INTO THE LUNGS EVERY 6 HOURS AS NEEDED FOR WHEEZING OR SHORTNESS OF BREATH 18 g 12    ALPRAZolam (XANAX) 0.25 MG tablet Take 1 tablet (0.25 mg total) by mouth 3 (three) times daily as needed for anxiety. 15 tablet 0   aluminum hydroxide-magnesium carbonate (GAVISCON) 95-358 MG/15ML SUSP Take 15 mLs by mouth 3 (three) times daily as needed for indigestion or heartburn.     amoxicillin-clavulanate (AUGMENTIN) 875-125 MG tablet Take 1 tablet by mouth 2 (two) times daily. 14 tablet 0   azelastine (ASTELIN) 137 MCG/SPRAY nasal spray Place 1 spray into the nose 2 (two) times daily as needed for rhinitis or allergies.     benzonatate (TESSALON) 200 MG capsule Take 200 mg by mouth 3 (three) times daily as needed for cough.     budesonide-formoterol (SYMBICORT) 160-4.5 MCG/ACT inhaler INHALE 2 PUFFS INTO THE LUNGS IN THE MORNING AND AT BEDTIME 10.2 g 12   cetirizine (ZYRTEC) 10 MG tablet Take 10 mg by mouth every morning.     colchicine (COLCRYS) 0.6 MG tablet Take 0.6 mg by mouth daily.     dextromethorphan (DELSYM) 30 MG/5ML liquid Take 30 mg by mouth daily as needed for cough.     diclofenac Sodium (VOLTAREN) 1 % GEL Apply 2 g topically 4 (four) times daily as needed (to affected areas- for arthritis pain.).     diphenhydrAMINE (BENADRYL) 25 mg capsule Take 50 mg by mouth at bedtime as needed for allergies.     Erenumab-aooe (AIMOVIG) 140 MG/ML SOAJ Inject 140 mg into the skin every 28 (twenty-eight) days. 1 mL 11   Ferrous Sulfate (  IRON) 142 (45 Fe) MG TBCR Take 142 mg by mouth in the morning and at bedtime.     Fiber 500 MG CAPS Take 1,000 mg by mouth in the morning.     finasteride (PROSCAR) 5 MG tablet Take 5 mg by mouth at bedtime.      furosemide (LASIX) 40 MG tablet Take 1 tablet (40 mg total) by mouth daily. 30 tablet 3   Guaifenesin (MUCINEX MAXIMUM STRENGTH) 1200 MG TB12 Take 1,200 mg by mouth 2 (two) times daily as needed (cough).     ibuprofen (ADVIL) 200 MG tablet Take 400 mg by mouth 2 (two) times daily as needed for headache (or pain).     ipratropium (ATROVENT) 0.06 %  nasal spray Place 2 sprays into both nostrils 2 (two) times daily.     ipratropium-albuterol (DUONEB) 0.5-2.5 (3) MG/3ML SOLN Take 3 mLs by nebulization every 6 (six) hours as needed. 360 mL 3   levothyroxine (SYNTHROID, LEVOTHROID) 112 MCG tablet Take 112 mcg by mouth daily before breakfast.      liothyronine (CYTOMEL) 25 MCG tablet Take 25 mcg by mouth daily before breakfast.     losartan (COZAAR) 50 MG tablet TAKE 1 TABLET BY MOUTH EVERY DAY 30 tablet 6   Menthol, Topical Analgesic, (BLUE-EMU MAXIMUM STRENGTH EX) Apply 1 application topically 3 (three) times daily as needed (arthritis pain.).     methocarbamol (ROBAXIN) 750 MG tablet Take 1 tablet (750 mg total) by mouth 4 (four) times daily as needed (use for muscle cramps/pain). 30 tablet 2   metoprolol tartrate (LOPRESSOR) 50 MG tablet TAKE 1 TABLET(50 MG) BY MOUTH TWICE DAILY 180 tablet 3   Multiple Vitamin (MULTIVITAMIN WITH MINERALS) TABS tablet Take 1 tablet by mouth daily with breakfast.     naproxen sodium (ALEVE) 220 MG tablet Take 220 mg by mouth 2 (two) times daily as needed (pain/headache).     olopatadine (PATANOL) 0.1 % ophthalmic solution Place 1 drop into both eyes 2 (two) times daily as needed for allergies.     omeprazole (PRILOSEC) 40 MG capsule Take 1 capsule (40 mg total) by mouth 2 (two) times daily. 180 capsule 1   PRESCRIPTION MEDICATION See admin instructions. CPAP- At bedtime     rivaroxaban (XARELTO) 20 MG TABS tablet TAKE 1 TABLET(20 MG) BY MOUTH DAILY WITH SUPPER 90 tablet 3   rizatriptan (MAXALT) 10 MG tablet TAKE 1 TABLET BY MOUTH AS NEEDED FOR MIGRAINE. MAY REPEAT IN 2 HOURS IF NEEDED 30 tablet 11   saccharomyces boulardii (FLORASTOR) 250 MG capsule Take 250 mg by mouth every morning.     sodium chloride (OCEAN) 0.65 % SOLN nasal spray Place 1 spray into both nostrils 2 (two) times daily.     Testosterone Cypionate 200 MG/ML SOLN Inject 200 mg into the muscle See admin instructions. Inject 200 mg (1 ml)  intramuscularly once every 7-10 days     venlafaxine XR (EFFEXOR-XR) 75 MG 24 hr capsule TAKE 1 CAPSULE(75 MG) BY MOUTH DAILY WITH BREAKFAST 90 capsule 1   zolpidem (AMBIEN) 10 MG tablet Take 10 mg by mouth at bedtime as needed for sleep.     Current Facility-Administered Medications  Medication Dose Route Frequency Provider Last Rate Last Admin   methylPREDNISolone acetate (DEPO-MEDROL) injection (RADIOLOGY ONLY) 120 mg  120 mg Intramuscular Once Omar Person, MD        Atrial Fibrillation Management history:  Previous antiarrhythmic drugs: amiodarone - tongue swelling Previous cardioversions: 11/28/21, 07/23/23 Previous ablations: None Anticoagulation  history: Xarelto   ROS- All systems are reviewed and negative except as per the HPI above.  Physical Exam: BP (!) 162/100   Pulse 62   Ht 5\' 10"  (1.778 m)   Wt 103.1 kg   BMI 32.60 kg/m   GEN- The patient is well appearing, alert and oriented x 3 today.   Neck - no JVD or carotid bruit noted Lungs- Clear to ausculation bilaterally, normal work of breathing Heart- Regular rate and rhythm, no murmurs, rubs or gallops, PMI not laterally displaced Extremities- no clubbing, cyanosis, or edema Skin - no rash or ecchymosis noted   EKG today demonstrates  Vent. rate 62 BPM PR interval 194 ms QRS duration 126 ms QT/QTcB 434/440 ms P-R-T axes 59 -48 -49 Normal sinus rhythm Left axis deviation Non-specific intra-ventricular conduction block Minimal voltage criteria for LVH, may be normal variant ( Cornell product ) Nonspecific T wave abnormality Abnormal ECG When compared with ECG of 23-Jul-2023 12:47, PREVIOUS ECG IS PRESENT  Echo 03/05/23 demonstrated   1. Left ventricular ejection fraction, by estimation, is 55 to 60%. The  left ventricle has normal function. The left ventricle has no regional  wall motion abnormalities. There is mild left ventricular hypertrophy.  Left ventricular diastolic parameters  were normal.    2. Right ventricular systolic function is normal. The right ventricular  size is normal. There is moderately elevated pulmonary artery systolic  pressure.   3. Left atrial size was moderately dilated.   4. The mitral valve is abnormal. Trivial mitral valve regurgitation. No  evidence of mitral stenosis.   5. 25 mm Edwards bioprosthetic valve mild calcification of leaflets  gradients stable since prior TTE cone 02/28/22 . The aortic valve has been  repaired/replaced. Aortic valve regurgitation is not visualized. No aortic  stenosis is present. There is a 25  mm Edwards bioprosthetic valve present in the aortic position. Procedure  Date: 10/28/21.   6. Aortic dilatation noted. There is mild dilatation of the aortic root,  measuring 38 mm.   7. The inferior vena cava is normal in size with greater than 50%  respiratory variability, suggesting right atrial pressure of 3 mmHg.   ASSESSMENT & PLAN CHA2DS2-VASc Score = 5  The patient's score is based upon: CHF History: 0 HTN History: 1 Diabetes History: 0 Stroke History: 2 Vascular Disease History: 1 Age Score: 1 Gender Score: 0       ASSESSMENT AND PLAN: Persistent Atrial Fibrillation (ICD10:  I48.19) The patient's CHA2DS2-VASc score is 5, indicating a 7.2% annual risk of stroke.   S/p successful cardioversion x 360J on 07/23/23.  He is in NSR.   If he has ERAF, patient states he would rather re-try amiodarone versus hospital admission for Tikosyn.   He had questions about possibly remaining on lasix 40 mg BID despite now being in normal rhythm. I recommended patient to please contact Dr. Fabio Bering office whether this would reasonable and also for potassium supplementation if agreed to keeping on higher dosage.   Secondary Hypercoagulable State (ICD10:  D68.69) The patient is at significant risk for stroke/thromboembolism based upon his CHA2DS2-VASc Score of 5.  Continue Rivaroxaban (Xarelto).  No missed doses.    Follow up as  scheduled with Dr. Eden Emms. F/u Afib clinic prn.    Lake Bells, PA-C  Afib Clinic [facility] [address] [place], [place] [address] [phone]

## 2023-08-04 ENCOUNTER — Ambulatory Visit: Payer: Medicare Other | Admitting: Internal Medicine

## 2023-08-10 ENCOUNTER — Encounter: Payer: Self-pay | Admitting: Internal Medicine

## 2023-08-10 NOTE — Telephone Encounter (Signed)
See [facility] message. Pt sick.

## 2023-08-10 NOTE — Telephone Encounter (Signed)
I know Grant [name] doesn't tolerate prednisone. Would he tolerate a trial of low-dose methylprednisolone (Medrol), which is in the depo-medrol shots we give?

## 2023-08-11 ENCOUNTER — Encounter: Payer: Self-pay | Admitting: Internal Medicine

## 2023-08-11 ENCOUNTER — Ambulatory Visit: Payer: Medicare Other

## 2023-08-11 ENCOUNTER — Ambulatory Visit: Payer: Medicare Other | Admitting: Internal Medicine

## 2023-08-11 ENCOUNTER — Ambulatory Visit: Payer: Medicare Other | Attending: Cardiovascular Disease | Admitting: Cardiovascular Disease

## 2023-08-11 ENCOUNTER — Encounter: Payer: Self-pay | Admitting: Cardiovascular Disease

## 2023-08-11 VITALS — BP 157/95 | HR 66 | Temp 97.7°F | Ht 70.0 in | Wt 224.0 lb

## 2023-08-11 DIAGNOSIS — R918 Other nonspecific abnormal finding of lung field: Secondary | ICD-10-CM | POA: Diagnosis not present

## 2023-08-11 DIAGNOSIS — G255 Other chorea: Secondary | ICD-10-CM

## 2023-08-11 DIAGNOSIS — J410 Simple chronic bronchitis: Secondary | ICD-10-CM

## 2023-08-11 DIAGNOSIS — Z952 Presence of prosthetic heart valve: Secondary | ICD-10-CM | POA: Diagnosis not present

## 2023-08-11 DIAGNOSIS — J4521 Mild intermittent asthma with (acute) exacerbation: Secondary | ICD-10-CM | POA: Diagnosis not present

## 2023-08-11 DIAGNOSIS — G259 Extrapyramidal and movement disorder, unspecified: Secondary | ICD-10-CM

## 2023-08-11 DIAGNOSIS — R0602 Shortness of breath: Secondary | ICD-10-CM

## 2023-08-11 DIAGNOSIS — I10 Essential (primary) hypertension: Secondary | ICD-10-CM | POA: Diagnosis not present

## 2023-08-11 DIAGNOSIS — G2401 Drug induced subacute dyskinesia: Secondary | ICD-10-CM | POA: Diagnosis not present

## 2023-08-11 DIAGNOSIS — I480 Paroxysmal atrial fibrillation: Secondary | ICD-10-CM

## 2023-08-11 MED ORDER — ROPINIROLE HCL 0.25 MG PO TABS: 0.2500 mg | ORAL_TABLET | Freq: Three times a day (TID) | ORAL | 2 refills | Status: DC

## 2023-08-11 MED ORDER — METHYLPREDNISOLONE ACETATE 80 MG/ML IJ SUSP
120.0000 mg | Freq: Once | INTRAMUSCULAR | Status: AC
Start: 2023-08-11 — End: 2023-08-11
  Administered 2023-08-11: 120 mg via INTRAMUSCULAR

## 2023-08-11 NOTE — Patient Instructions (Signed)
Medication Instructions:  *If you need a refill on your cardiac medications before your next appointment, please call your pharmacy*  Lab Work: If you have labs (blood work) drawn today and your tests are completely normal, you will receive your results only by: MyChart Message (if you have MyChart) OR A paper copy in the mail If you have any lab test that is abnormal or we need to change your treatment, we will call you to review the results.   Follow-Up: At [facility], you and your health needs are our priority.  As part of our continuing mission to provide you with exceptional heart care, we have created designated Provider Care Teams.  These Care Teams include your primary Cardiologist (physician) and Advanced Practice Providers (APPs -  Physician Assistants and Nurse Practitioners) who all work together to provide you with the care you need, when you need it.  We recommend signing up for the patient portal called "MyChart".  Sign up information is provided on this After Visit Summary.  MyChart is used to connect with patients for Virtual Visits (Telemedicine).  Patients are able to view lab/test results, encounter notes, upcoming appointments, etc.  Non-urgent messages can be sent to your provider as well.   To learn more about what you can do with MyChart, go to [email or web address].    Your next appointment:   6 month(s)  Provider:   Charlton Haws, MD

## 2023-08-11 NOTE — Patient Instructions (Signed)
Script sent to try requip 3 times daily  Order- cxr     dx asthmatic bronchitis exacerbation  Order- depo medrol 120 mg    asthmatic bronchitis exacerbation

## 2023-08-11 NOTE — Progress Notes (Signed)
History of Present Illness: . male former smoker with asthma/chronic bronchitis and allergic rhinitis, OSA PAF/ anticoagulation, aortic valve replacement, toxic encephalopathy, light sensitivity, chronic fatigue/fibromyalgia NPSG 03/24/03- AHI 88/ hr, desaturation to 75%, body weight 244 lbs PFT 06/01/17-WNL-FVC 4.46/96%, FEV1 3.71/106%, ratio 0.83, FEF 25-75% 4.64/167%, no response to dilator, TLC 107%, DLCO 101%  PFT 07/07/22- ------------------------------------------------------------------   06/25/23-72 year old male former smoker (13.5 pkyrs) followed for Asthma/chronic Bronchitis and allergic Rhinitis,  OSA, Lung Nodule,  complicated by PAF/ Xarelto, Aortic Valve Replacement,/ IE abscess, Toxic Encephalopathy, Light sensitivity, Chronic Fatigue/fibromyalgia, CVA, Post-stroke chorea,  -Flutter valve, Symbicort 160, Astelin nasal, tessalon, singulair, Ventolin hfa,  CPAP   14/Adapt   AirSense 10 AutoSet    - replacement and change to auto5-15 ordered 03/04/22 Download compliance- 100%, AHI 7/hr Body weight today-237 lbs Hosp [facility]- BPH/ urinary obstruction -----Patient states he is not been doing well.   Wife here -----Complains of cough, fatigue and chest congestion Report 3 different antibiotics since June. Levaquin>>Malaise.  Augmentin, levaqin then cefuroxime. Coughing, can't get his breath, throat clearing. Worse past 2 weeks. Sputum may be colored. Rests and breathes better on CPAP, so going on it early. Covid test neg. Wife reports some sort of respiratory distress on and off since June. [facility] test neg on 8/13 for Covid-RSV-Flu. More cough and more productive last 2 weeks, throat clearing, can't catch breath. Helps to go onto his CPAP. No fever, some cold sweats. Trying probiotic to help GI from antibiotics. Avoids cough drops- afraid he will inhale them. Augmentin may have helped most. CTachest 06/01/23- IMPRESSION: 1. No evidence of thoracic aortic aneurysm. 2. Postsurgical change  consistent aortic valve replacement. 3. Left part-solid pulmonary nodule within the upper lobe measuring 7 mm. Per Fleischner Society Guidelines, recommend a non-contrast Chest CT at 3-6 months to confirm persistence. If unchanged and the solid component remains < 6 mm, an annual non-contrast Chest CT should be performed for 5 years. If the solid component is 6-8 mm on follow-up, recommend biopsy or resection. If the solid component is > 8 mm on follow-up, recommend PET/CT, biopsy or resection. These guidelines do not apply to immunocompromised patients and patients with cancer. Follow up in patients with significant comorbidities as clinically warranted. For lung cancer screening, adhere to Lung-RADS guidelines. Reference: Radiology. 2017; 284(1):228-43.  07/28/23- 72 year old male former smoker (13.5 pkyrs) followed for Asthma/chronic Bronchitis and allergic Rhinitis,  OSA, Lung Nodule,  complicated by PAF/ Xarelto, Aortic Valve Replacement,/ IE abscess, Toxic Encephalopathy, Light sensitivity, Chronic Fatigue/fibromyalgia, CVA, Post-stroke chorea ( Is cough a tic?), GERD,  -Flutter valve, Symbicort 160, Astelin nasal, tessalon, singulair, Ventolin hfa,  CPAP   14/Adapt   AirSense 10 AutoSet    - replacement and change to auto5-15 ordered 03/04/22 Download compliance-  Body weight today-224 lbs Note active raspy cough, throat clearing/ retching.       Wife here Increased hiccups, cough. Can't tell if there is reflux. Sweats but no fever. Unstable gait- fear of falling. We discussed trial of requip to see if that would help limb movement any, while he reaches out to Neurology. They have felt that depomedrol inj helps, but insist he can't tolerate medrol or prednisone tabs, which increase agitation. We discussed trial of hydrocortisone for very low dose effect to see if he tolerates and finds it helps.  ROS-see HPI     + = positive Constitutional:   No-   weight loss, night sweats, fevers,  chills, fatigue, lassitude. HEENT:   No-  headaches,  difficulty swallowing, tooth/dental problems,  sore throat,       No-  sneezing, itching, ear ache,                    No-nasal congestion, + post nasal drip,  CV:  +chest pain, orthopnea, PND, swelling in lower extremities, anasarca,  dizziness, palpitations Resp: No- coughing up of blood.  +cough             change in color of mucus.  No- wheezing.   Skin: Clear GI:   heartburn, indigestion, abdominal pain, nausea, vomiting,  GU:  MS:  No-   joint pain or swelling.   Neuro-    +chorea, twitches and jerks, paroxysmal cough which might be same mechanism or something else. Psych:  No- change in mood or affect. + depression or anxiety.  No memory loss.  OBJ- Physical Exam Sunglasses, fidgeting/ restless General- Alert, Oriented, Affect-appropriate, Distress- none acute. +fidgeting, restless, sunglasses Skin- +eczematoid patches on lower calves, nonspecific, excoriated Lymphadenopathy- none Head- atraumatic. Stares aimlessly around room like a blind person.             Eyes- Gross vision intact, PERRLA, conjunctivae and secretions clear,  + sunglasses            Ears- Hearing, canals-normal            Nose- Clear, no-Septal dev, mucus, polyps, erosion, perforation             Throat- Mallampati II-III, mucosa , drainage- none, tonsils- atrophic Neck- flexible , trachea midline, no stridor , thyroid nl, carotid no bruit Chest - symmetrical excursion , unlabored           Heart/CV- RRR ,  murmur+2-3/6 S, no gallop  , no rub, nl s1 s2,                            - JVD- none , edema- none, stasis changes- none, varices- none           Lung- + spastic cough, +hiccups, wheeze+, dullness-none, rub- none           Chest wall-  Abd-  Br/ Gen/ Rectal- Not done, not indicated Extrem- cyanosis- none, clubbing, none, atrophy- none, strength- nl Neuro- +constant movement of legs/ chorea

## 2023-08-13 MED ORDER — HYDROCORTISONE 5 MG PO TABS: 5.0000 mg | ORAL_TABLET | Freq: Every day | ORAL | 1 refills | Status: DC

## 2023-08-13 NOTE — Telephone Encounter (Signed)
Please advise on response from pt regarding medrol  We could try it.I don't remember if he has had before.If it makes him jittery, mad or not sleep we can  discontinue.    What did xrays show?

## 2023-08-13 NOTE — Telephone Encounter (Signed)
We are still waiting for radiologist to release report on chest xray- they are way backed up. We will call when it comes in.  I have sent a script for hydrocortisone 5 mg table to take 1 each morning. This is milder than prednisone. Hopefully it will help some.

## 2023-08-14 ENCOUNTER — Encounter (HOSPITAL_BASED_OUTPATIENT_CLINIC_OR_DEPARTMENT_OTHER): Payer: Self-pay | Admitting: *Deleted

## 2023-08-14 ENCOUNTER — Other Ambulatory Visit: Payer: Self-pay

## 2023-08-14 ENCOUNTER — Emergency Department (HOSPITAL_BASED_OUTPATIENT_CLINIC_OR_DEPARTMENT_OTHER): Payer: Medicare Other

## 2023-08-14 ENCOUNTER — Emergency Department (HOSPITAL_BASED_OUTPATIENT_CLINIC_OR_DEPARTMENT_OTHER)
Admission: EM | Admit: 2023-08-14 | Discharge: 2023-08-14 | Disposition: A | Discharge status: Home / Self Care | Payer: Medicare Other | Attending: Emergency Medicine | Admitting: Emergency Medicine

## 2023-08-14 DIAGNOSIS — R519 Headache, unspecified: Secondary | ICD-10-CM | POA: Diagnosis not present

## 2023-08-14 DIAGNOSIS — Z20822 Contact with and (suspected) exposure to covid-19: Secondary | ICD-10-CM | POA: Insufficient documentation

## 2023-08-14 DIAGNOSIS — Z7951 Long term (current) use of inhaled steroids: Secondary | ICD-10-CM | POA: Insufficient documentation

## 2023-08-14 DIAGNOSIS — Z7901 Long term (current) use of anticoagulants: Secondary | ICD-10-CM | POA: Insufficient documentation

## 2023-08-14 DIAGNOSIS — J40 Bronchitis, not specified as acute or chronic: Secondary | ICD-10-CM | POA: Diagnosis not present

## 2023-08-14 DIAGNOSIS — G259 Extrapyramidal and movement disorder, unspecified: Secondary | ICD-10-CM | POA: Diagnosis not present

## 2023-08-14 DIAGNOSIS — J45909 Unspecified asthma, uncomplicated: Secondary | ICD-10-CM | POA: Diagnosis not present

## 2023-08-14 DIAGNOSIS — J986 Disorders of diaphragm: Secondary | ICD-10-CM | POA: Diagnosis not present

## 2023-08-14 DIAGNOSIS — I6529 Occlusion and stenosis of unspecified carotid artery: Secondary | ICD-10-CM | POA: Diagnosis not present

## 2023-08-14 DIAGNOSIS — I700 Atherosclerosis of aorta: Secondary | ICD-10-CM | POA: Diagnosis not present

## 2023-08-14 DIAGNOSIS — R066 Hiccough: Secondary | ICD-10-CM

## 2023-08-14 DIAGNOSIS — I4891 Unspecified atrial fibrillation: Secondary | ICD-10-CM | POA: Diagnosis not present

## 2023-08-14 DIAGNOSIS — R221 Localized swelling, mass and lump, neck: Secondary | ICD-10-CM | POA: Diagnosis not present

## 2023-08-14 DIAGNOSIS — I771 Stricture of artery: Secondary | ICD-10-CM | POA: Diagnosis not present

## 2023-08-14 DIAGNOSIS — I6782 Cerebral ischemia: Secondary | ICD-10-CM | POA: Diagnosis not present

## 2023-08-14 DIAGNOSIS — R0602 Shortness of breath: Secondary | ICD-10-CM

## 2023-08-14 DIAGNOSIS — R0989 Other specified symptoms and signs involving the circulatory and respiratory systems: Secondary | ICD-10-CM | POA: Diagnosis not present

## 2023-08-14 DIAGNOSIS — Z87891 Personal history of nicotine dependence: Secondary | ICD-10-CM | POA: Insufficient documentation

## 2023-08-14 LAB — COMPREHENSIVE METABOLIC PANEL
ALT: 29 U/L (ref 0–44)
AST: 32 U/L (ref 15–41)
Albumin: 4.1 g/dL (ref 3.5–5.0)
Alkaline Phosphatase: 47 U/L (ref 38–126)
Anion gap: 5 (ref 5–15)
BUN: 17 mg/dL (ref 8–23)
CO2: 30 mmol/L (ref 22–32)
Calcium: 9.3 mg/dL (ref 8.9–10.3)
Chloride: 104 mmol/L (ref 98–111)
Creatinine, Ser: 0.88 mg/dL (ref 0.61–1.24)
GFR, Estimated: >60 mL/min (ref >60)
Glucose, Bld: 97 mg/dL (ref 70–99)
Potassium: 3.7 mmol/L (ref 3.5–5.1)
Sodium: 139 mmol/L (ref 135–145)
Total Bilirubin: 1.1 mg/dL (ref 0.3–1.2)
Total Protein: 6.1 g/dL — ABNORMAL LOW (ref 6.5–8.1)

## 2023-08-14 LAB — MAGNESIUM: Magnesium: 1.7 mg/dL (ref 1.7–2.4)

## 2023-08-14 LAB — CBC WITH DIFFERENTIAL/PLATELET
Abs Immature Granulocytes: 0.12 10*3/uL — ABNORMAL HIGH (ref 0.00–0.07)
Basophils Absolute: 0.1 10*3/uL (ref 0.0–0.1)
Basophils Relative: 0 %
Eosinophils Absolute: 0.1 10*3/uL (ref 0.0–0.5)
Eosinophils Relative: 1 %
HCT: 49.7 % (ref 39.0–52.0)
Hemoglobin: 17.1 g/dL — ABNORMAL HIGH (ref 13.0–17.0)
Immature Granulocytes: 1 %
Lymphocytes Relative: 10 %
Lymphs Abs: 1.1 10*3/uL (ref 0.7–4.0)
MCH: 31.7 pg (ref 26.0–34.0)
MCHC: 34.4 g/dL (ref 30.0–36.0)
MCV: 92.2 fL (ref 80.0–100.0)
Monocytes Absolute: 1.2 10*3/uL — ABNORMAL HIGH (ref 0.1–1.0)
Monocytes Relative: 10 %
Neutro Abs: 8.7 10*3/uL — ABNORMAL HIGH (ref 1.7–7.7)
Neutrophils Relative %: 78 %
Platelets: 150 10*3/uL (ref 150–400)
RBC: 5.39 MIL/uL (ref 4.22–5.81)
RDW: 12.3 % (ref 11.5–15.5)
WBC: 11.2 10*3/uL — ABNORMAL HIGH (ref 4.0–10.5)
nRBC: 0 % (ref 0.0–0.2)

## 2023-08-14 LAB — I-STAT VENOUS BLOOD GAS, ED
Acid-Base Excess: 2 mmol/L (ref 0.0–2.0)
Bicarbonate: 26.6 mmol/L (ref 20.0–28.0)
Calcium, Ion: 1.16 mmol/L (ref 1.15–1.40)
HCT: 50 % (ref 39.0–52.0)
Hemoglobin: 17 g/dL (ref 13.0–17.0)
O2 Saturation: 82 %
Potassium: 3.5 mmol/L (ref 3.5–5.1)
Sodium: 139 mmol/L (ref 135–145)
TCO2: 28 mmol/L (ref 22–32)
pCO2, Ven: 40.2 mm[Hg] — ABNORMAL LOW (ref 44–60)
pH, Ven: 7.428 (ref 7.25–7.43)
pO2, Ven: 45 mm[Hg] (ref 32–45)

## 2023-08-14 LAB — RESP PANEL BY RT-PCR (RSV, FLU A&B, COVID)  RVPGX2
Influenza A by PCR: NEGATIVE
Influenza B by PCR: NEGATIVE
Resp Syncytial Virus by PCR: NEGATIVE
SARS Coronavirus 2 by RT PCR: NEGATIVE

## 2023-08-14 LAB — GROUP A STREP BY PCR: Group A Strep by PCR: NOT DETECTED

## 2023-08-14 LAB — BRAIN NATRIURETIC PEPTIDE: B Natriuretic Peptide: 146.2 pg/mL — ABNORMAL HIGH (ref 0.0–100.0)

## 2023-08-14 LAB — TROPONIN I (HIGH SENSITIVITY)
Troponin I (High Sensitivity): 16 ng/L (ref <18)
Troponin I (High Sensitivity): 15 ng/L (ref <18)

## 2023-08-14 MED ORDER — IOHEXOL 350 MG/ML SOLN
100.0000 mL | Freq: Once | INTRAVENOUS | Status: AC | PRN
  Administered 2023-08-14: 100 mL via INTRAVENOUS

## 2023-08-14 MED ORDER — LORAZEPAM 0.5 MG PO TABS: 0.5000 mg | ORAL_TABLET | Freq: Three times a day (TID) | ORAL | 0 refills | Status: AC | PRN

## 2023-08-14 MED ORDER — IPRATROPIUM-ALBUTEROL 0.5-2.5 (3) MG/3ML IN SOLN
3.0000 mL | Freq: Once | RESPIRATORY_TRACT | Status: AC
  Administered 2023-08-14: 3 mL via RESPIRATORY_TRACT
  Filled 2023-08-14: qty 3

## 2023-08-14 MED ORDER — RACEPINEPHRINE HCL 2.25 % IN NEBU
0.5000 mL | INHALATION_SOLUTION | Freq: Once | RESPIRATORY_TRACT | Status: AC
  Administered 2023-08-14: 0.5 mL via RESPIRATORY_TRACT
  Filled 2023-08-14: qty 0.5

## 2023-08-14 MED ORDER — ALBUTEROL SULFATE HFA 108 (90 BASE) MCG/ACT IN AERS: 2.0000 | INHALATION_SPRAY | RESPIRATORY_TRACT | Status: DC | PRN

## 2023-08-14 MED ORDER — LORAZEPAM 2 MG/ML IJ SOLN
0.5000 mg | Freq: Once | INTRAMUSCULAR | Status: AC
  Administered 2023-08-14: 0.5 mg via INTRAVENOUS
  Filled 2023-08-14: qty 1

## 2023-08-14 MED ORDER — METHYLPREDNISOLONE SODIUM SUCC 125 MG IJ SOLR
125.0000 mg | Freq: Once | INTRAMUSCULAR | Status: AC
  Administered 2023-08-14: 125 mg via INTRAVENOUS
  Filled 2023-08-14: qty 2

## 2023-08-14 MED ORDER — DIPHENHYDRAMINE HCL 50 MG/ML IJ SOLN
25.0000 mg | Freq: Once | INTRAMUSCULAR | Status: AC
  Administered 2023-08-14: 25 mg via INTRAVENOUS
  Filled 2023-08-14: qty 1

## 2023-08-14 MED ORDER — DIAZEPAM 5 MG/ML IJ SOLN
2.5000 mg | Freq: Once | INTRAMUSCULAR | Status: AC
  Administered 2023-08-14: 2.5 mg via INTRAVENOUS
  Filled 2023-08-14: qty 2

## 2023-08-14 NOTE — ED Notes (Signed)
RT Note: Patient was given a racemic epi nebulizer treatment due to an increased SOB and WOB. Patient is tolerating well at this time

## 2023-08-14 NOTE — ED Notes (Signed)
RT Note: VBG completed and handed to Dr. Dalene Seltzer.

## 2023-08-14 NOTE — ED Triage Notes (Signed)
Pt is here for worsening of sob which began 10/22.  No fever or chills with this.  Spo2 100% on RA in triage.  Pt was evaluated during triage by RT.  EDP at the bedside.

## 2023-08-14 NOTE — ED Provider Notes (Signed)
[place] EMERGENCY DEPARTMENT AT [facility] Provider Note   CSN: [id number] Arrival date & time: 08/14/23  3086     History {Add pertinent medical, surgical, social history, OB history to HPI:1} Chief Complaint  Patient presents with   Shortness of Breath    Grant [name] is a 72 y.o. male.  HPI     72 year old male former smoker with asthma/chronic bronchitis and allergic rhinitis, OSA, paroxysmal atrial fibrillation on Xarelto, aortic valve replacement, depression, chronic fatigue syndrome/fibromylagia, history of movement disorder for which he has seen Dr. 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     Reports for 1 week he has had cough and shortness of breath, is having symptoms almost like hiccups/gasping. Throat has been sore maybe from coughing.  Saw Dr. Eden Emms who recommended ED or pulmonology and he called pulm and was seen by Dr. Maple Hudson.  HE started him on requip and gave him steroid depo-medrol shot.  Thought maybe feeling better but continuing to have breathing spasms. Today he once felt like his throat closed up but then it relaxed again, had similar sensation last week. Has had congestion.   Had Cardioversion 10/3.    Hx of movement disorder--negative hunting[place] panel, left caudate infarct causing right hemichorea, tried amantadine   Home Medications Prior to Admission medications   Medication Sig Start Date End Date Taking? Authorizing Provider  acetaminophen (TYLENOL) 500 MG tablet Take 1,000 mg by mouth every 8 (eight) hours as needed for headache or moderate pain.    [provider]  albuterol (VENTOLIN HFA) 108 (90 Base) MCG/ACT inhaler INHALE 2 PUFFS INTO THE LUNGS EVERY 6 HOURS AS NEEDED FOR WHEEZING OR SHORTNESS OF BREATH 07/16/23   Young, Rennis Chris, MD  ALPRAZolam Prudy Feeler) 0.25 MG  tablet Take 1 tablet (0.25 mg total) by mouth 3 (three) times daily as needed for anxiety. 10/13/20   Glade Lloyd, MD  aluminum hydroxide-magnesium carbonate (GAVISCON) 95-358 MG/15ML SUSP Take 15 mLs by mouth 3 (three) times daily as needed for indigestion or heartburn.    [provider]  azelastine (ASTELIN) 137 MCG/SPRAY nasal spray Place 1 spray into the nose 2 (two) times daily as needed for rhinitis or allergies.    [provider]  benzonatate (TESSALON) 200 MG capsule Take 200 mg by mouth 3 (three) times daily as needed for cough.    [provider]  budesonide-formoterol (SYMBICORT) 160-4.5 MCG/ACT inhaler INHALE 2 PUFFS INTO THE LUNGS IN THE MORNING AND AT BEDTIME 06/02/22   Young, Joni Fears D, MD  cetirizine (ZYRTEC) 10 MG tablet Take 10 mg by mouth every morning.    [provider]  colchicine (COLCRYS) 0.6 MG tablet Take 0.6 mg by mouth daily.    [provider]  dextromethorphan (DELSYM) 30 MG/5ML liquid Take 30 mg by mouth daily as needed for cough.    [provider]  diclofenac Sodium (VOLTAREN) 1 % GEL Apply 2 g topically 4 (four) times daily as needed (to affected areas- for arthritis pain.).    [provider]  diphenhydrAMINE (BENADRYL) 25 mg capsule Take 50 mg by mouth at bedtime as needed for allergies.    [provider]  Erenumab-aooe (AIMOVIG) 140 MG/ML SOAJ Inject 140 mg into the skin every 28 (twenty-eight) days. 12/30/22   Van Clines, MD  Ferrous Sulfate (IRON) 142 (45  Fe) MG TBCR Take 142 mg by mouth in the morning and at bedtime.    [provider]  Fiber 500 MG CAPS Take 1,000 mg by mouth in the morning.    [provider]  finasteride (PROSCAR) 5 MG tablet Take 5 mg by mouth at bedtime.  01/14/14   [provider]  furosemide (LASIX) 40 MG tablet Take 1 tablet (40 mg total) by mouth daily. 07/08/23   Corky Crafts, MD  furosemide (LASIX) 40 MG tablet Take 80 mg  by mouth.    [provider]  Guaifenesin (MUCINEX MAXIMUM STRENGTH) 1200 MG TB12 Take 1,200 mg by mouth 2 (two) times daily as needed (cough).    [provider]  hydrocortisone (CORTEF) 5 MG tablet Take 1 tablet (5 mg total) by mouth daily. 08/13/23   Jetty Duhamel D, MD  ibuprofen (ADVIL) 200 MG tablet Take 400 mg by mouth 2 (two) times daily as needed for headache (or pain).    [provider]  ipratropium (ATROVENT) 0.06 % nasal spray Place 2 sprays into both nostrils 2 (two) times daily.    [provider]  ipratropium-albuterol (DUONEB) 0.5-2.5 (3) MG/3ML SOLN Take 3 mLs by nebulization every 6 (six) hours as needed. 08/25/22   Waymon Budge, MD  levothyroxine (SYNTHROID, LEVOTHROID) 112 MCG tablet Take 112 mcg by mouth daily before breakfast.     [provider]  liothyronine (CYTOMEL) 25 MCG tablet Take 25 mcg by mouth daily before breakfast.    [provider]  losartan (COZAAR) 50 MG tablet TAKE 1 TABLET BY MOUTH EVERY DAY 10/01/20   Lovett Sox, MD  Menthol, Topical Analgesic, (BLUE-EMU MAXIMUM STRENGTH EX) Apply 1 application topically 3 (three) times daily as needed (arthritis pain.).    [provider]  methocarbamol (ROBAXIN) 750 MG tablet Take 1 tablet (750 mg total) by mouth 4 (four) times daily as needed (use for muscle cramps/pain). 06/11/22   Karie Soda, MD  metoprolol tartrate (LOPRESSOR) 50 MG tablet TAKE 1 TABLET(50 MG) BY MOUTH TWICE DAILY 09/08/22   Wendall Stade, MD  Multiple Vitamin (MULTIVITAMIN WITH MINERALS) TABS tablet Take 1 tablet by mouth daily with breakfast.    [provider]  naproxen sodium (ALEVE) 220 MG tablet Take 220 mg by mouth 2 (two) times daily as needed (pain/headache).    [provider]  olopatadine (PATANOL) 0.1 % ophthalmic solution Place 1 drop into both eyes 2 (two) times daily as needed for allergies.    [provider]  omeprazole (PRILOSEC) 40  MG capsule Take 1 capsule (40 mg total) by mouth 2 (two) times daily. 10/31/16   Wendall Stade, MD  PRESCRIPTION MEDICATION See admin instructions. CPAP- At bedtime    [provider]  rivaroxaban (XARELTO) 20 MG TABS tablet TAKE 1 TABLET(20 MG) BY MOUTH DAILY WITH SUPPER 09/08/22   Wendall Stade, MD  rizatriptan (MAXALT) 10 MG tablet TAKE 1 TABLET BY MOUTH AS NEEDED FOR MIGRAINE. MAY REPEAT IN 2 HOURS IF NEEDED 12/19/22   Van Clines, MD  rOPINIRole (REQUIP) 0.25 MG tablet Take 1 tablet (0.25 mg total) by mouth 3 (three) times daily. 08/11/23   Waymon Budge, MD  saccharomyces boulardii (FLORASTOR) 250 MG capsule Take 250 mg by mouth every morning.    [provider]  sodium chloride (OCEAN) 0.65 % SOLN nasal spray Place 1 spray into both nostrils 2 (two) times daily.    [provider]  Testosterone  Cypionate 200 MG/ML SOLN Inject 200 mg into the muscle See admin instructions. Inject 200 mg (1 ml) intramuscularly once every 7-10 days    [provider]  venlafaxine XR (EFFEXOR-XR) 75 MG 24 hr capsule TAKE 1 CAPSULE(75 MG) BY MOUTH DAILY WITH BREAKFAST 01/22/23   Van Clines, MD  zolpidem (AMBIEN) 10 MG tablet Take 10 mg by mouth at bedtime as needed for sleep. 11/06/21   [provider]      Allergies    Annamaria Helling tosylate], Perphenazine, Amantadines, Amiodarone, Codeine, Levaquin [levofloxacin], Prednisone, and Tramadol    Review of Systems   Review of Systems  Physical Exam Updated Vital Signs BP (!) 148/101   Pulse 73   Temp 97.9 F (36.6 C) (Oral)   Resp 19   Wt 98.9 kg   SpO2 100%   BMI 31.28 kg/m  Physical Exam Vitals and nursing note reviewed.  Constitutional:      General: He is not in acute distress.    Appearance: He is well-developed. He is not diaphoretic.  HENT:     Head: Normocephalic and atraumatic.  Eyes:     Conjunctiva/sclera: Conjunctivae normal.  Cardiovascular:     Rate and Rhythm: Normal  rate and regular rhythm.     Heart sounds: Normal heart sounds. No murmur heard.    No friction rub. No gallop.  Pulmonary:     Effort: No respiratory distress.     Breath sounds: Normal breath sounds. No wheezing or rales.     Comments: Frequent gasping No stridor, no clear wheezing, no crackles  Abdominal:     General: There is no distension.     Palpations: Abdomen is soft.     Tenderness: There is no abdominal tenderness. There is no guarding.  Musculoskeletal:     Cervical back: Normal range of motion.  Skin:    General: Skin is warm and dry.  Neurological:     Mental Status: [name], place, and time.     Comments: Frequent movements     ED Results / Procedures / Treatments   Labs (all labs ordered are listed, but only abnormal results are displayed) Labs Reviewed  RESP PANEL BY RT-PCR (RSV, FLU A&B, COVID)  RVPGX2  GROUP A STREP BY PCR  CBC WITH DIFFERENTIAL/PLATELET  COMPREHENSIVE METABOLIC PANEL  BRAIN NATRIURETIC PEPTIDE  MAGNESIUM  TROPONIN I (HIGH SENSITIVITY)    EKG None  Radiology No results found.  Procedures Procedures  {Document cardiac monitor, telemetry assessment procedure when appropriate:1}  Medications Ordered in ED Medications  albuterol (VENTOLIN HFA) 108 (90 Base) MCG/ACT inhaler 2 puff (has no administration in time range)  methylPREDNISolone sodium succinate (SOLU-MEDROL) 125 mg/2 mL injection 125 mg (has no administration in time range)  diazepam (VALIUM) injection 2.5 mg (has no administration in time range)  ipratropium-albuterol (DUONEB) 0.5-2.5 (3) MG/3ML nebulizer solution 3 mL (3 mLs Nebulization Given 08/14/23 0835)    ED Course/ Medical Decision Making/ A&P   {   Click here for ABCD2, HEART and other calculatorsREFRESH Note before signing :1}                               72 year old male former smoker with asthma/chronic bronchitis and allergic rhinitis, OSA, paroxysmal atrial fibrillation on  Xarelto, aortic valve replacement, depression, chronic fatigue syndrome/fibromylagia, 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   DDx includes exacerbation of asthma/chronic bronchitis, ACS, PE, CHF exacerbation, anemia, pneumonia, viral etiology such as COVID 19 infection, metabolic abnormality, other neurologic etiology of diaphragmatic spasm such as CVA, worsening movement disorder, RPA, PTA, angioedema, other mass.      Labs completed and personally about interpreted by me showing normal venous blood gas, no anemia, mild leukocytosis, no clinically significant electrolyte abnormalities, BNP 146, troponin within normal limits.  COVID, flu and RSV testing were negative as was a strep test.  CT head completed to look for central etiology of his difficulty breathing, increased movement disorder shows no clinically significant findings, no mass, no ICH.    {Document critical care time when appropriate:1} {Document review of labs and clinical decision tools ie heart score, Chads2Vasc2 etc:1}  {Document your independent review of radiology images, and any outside records:1} {Document your discussion with family members, caretakers, and with consultants:1} {Document social determinants of health affecting pt's care:1} {Document your decision making why or why not admission, treatments were needed:1} Final Clinical Impression(s) / ED Diagnoses Final diagnoses:  None    Rx / DC Orders ED Discharge Orders     None

## 2023-08-15 DIAGNOSIS — J40 Bronchitis, not specified as acute or chronic: Secondary | ICD-10-CM | POA: Diagnosis not present

## 2023-08-16 ENCOUNTER — Encounter: Payer: Self-pay | Admitting: Internal Medicine

## 2023-08-16 NOTE — Assessment & Plan Note (Signed)
Benefits with good compliance.  Change to autopap expected to improve control

## 2023-08-16 NOTE — Assessment & Plan Note (Signed)
Again emphasize reflux precautions

## 2023-08-16 NOTE — Assessment & Plan Note (Signed)
Exacerbation of asthmatic bronchitis. Still wonder if ome of this is a tic related to his movement disorder and perhaps associated reflux. The think there is some infection. He doesn't tolerate prednisone due to mental stimulation.  Pan reflux precautions. Depo 120, augmentin

## 2023-08-16 NOTE — Assessment & Plan Note (Signed)
Rhythm hard to judge today. Not very irregular.

## 2023-08-17 ENCOUNTER — Encounter: Payer: Self-pay | Admitting: Neurology

## 2023-08-18 ENCOUNTER — Other Ambulatory Visit: Payer: Self-pay | Admitting: Cardiology

## 2023-08-18 MED ORDER — FUROSEMIDE 40 MG PO TABS: 80.0000 mg | ORAL_TABLET | Freq: Every day | ORAL | 0 refills | Status: DC

## 2023-08-18 NOTE — Telephone Encounter (Signed)
DOD, Dr. Rosemary Holms, agreed to one month refill on Lasix 80 mg by mouth daily. Will clarify with Dr. Eden Emms when he is back in the office to see if this is a permanent change to patient's lasix.

## 2023-08-19 DIAGNOSIS — G4733 Obstructive sleep apnea (adult) (pediatric): Secondary | ICD-10-CM | POA: Diagnosis not present

## 2023-08-20 ENCOUNTER — Encounter: Payer: Self-pay | Admitting: Internal Medicine

## 2023-08-20 ENCOUNTER — Telehealth: Payer: Self-pay | Admitting: Internal Medicine

## 2023-08-20 DIAGNOSIS — G255 Other chorea: Secondary | ICD-10-CM | POA: Insufficient documentation

## 2023-08-20 MED ORDER — BUDESONIDE-FORMOTEROL FUMARATE 160-4.5 MCG/ACT IN AERO: 2.0000 | INHALATION_SPRAY | Freq: Two times a day (BID) | RESPIRATORY_TRACT | 12 refills | Status: DC

## 2023-08-20 NOTE — Assessment & Plan Note (Signed)
Hard to judge. Some of the cough might be a tic related to his chorea, He sees benefit in depomedrol, for a few days. We discussed trial of low dose hydrocortisone for efficacy and benefit. Plan- CXR, consider hydrocortisone after depomedrol today. Steroid talk.

## 2023-08-20 NOTE — Assessment & Plan Note (Signed)
Need to see what neurolgy would call this constant jerking twitching and shifting. Curious if his paroxysmal cough is the same thing, Plan- trial of requip pending Neurolgy.

## 2023-08-20 NOTE — Telephone Encounter (Signed)
He has been using hydrocortisone tabs 5 mg. He can increase to 2 tabs each morning (10mg ) using current supply. If this is tolerated and seems helpful, let me know and I will change his prescription.

## 2023-08-20 NOTE — Telephone Encounter (Signed)
Pt wife calling saying Pharm has req Symbcort to no avail. PT is out.   Also would like Dr. Maple Hudson to increase his hydrocortisone from .5 to 1.0 if possible   Pharm Req 2 days ago.Pharm [facility] on [address]  Wife's # is [phone]

## 2023-08-20 NOTE — Telephone Encounter (Signed)
I have sent in a refill on the Symbicort.  Lm x1 for the patient's wife.  Dr. Maple Hudson please advise regarding the Hydrocortisone.

## 2023-08-25 DIAGNOSIS — N401 Enlarged prostate with lower urinary tract symptoms: Secondary | ICD-10-CM | POA: Diagnosis not present

## 2023-08-25 DIAGNOSIS — R350 Frequency of micturition: Secondary | ICD-10-CM | POA: Diagnosis not present

## 2023-08-25 DIAGNOSIS — R351 Nocturia: Secondary | ICD-10-CM | POA: Diagnosis not present

## 2023-08-25 MED ORDER — FUROSEMIDE 40 MG PO TABS: 80.0000 mg | ORAL_TABLET | Freq: Every day | ORAL | 3 refills | Status: DC

## 2023-08-25 NOTE — Telephone Encounter (Signed)
Sent in refill for lasix and updated medication list.

## 2023-08-26 DIAGNOSIS — I10 Essential (primary) hypertension: Secondary | ICD-10-CM | POA: Diagnosis not present

## 2023-08-26 DIAGNOSIS — F3341 Major depressive disorder, recurrent, in partial remission: Secondary | ICD-10-CM | POA: Diagnosis not present

## 2023-08-26 DIAGNOSIS — I2510 Atherosclerotic heart disease of native coronary artery without angina pectoris: Secondary | ICD-10-CM | POA: Diagnosis not present

## 2023-08-26 DIAGNOSIS — D6869 Other thrombophilia: Secondary | ICD-10-CM | POA: Diagnosis not present

## 2023-08-26 DIAGNOSIS — I480 Paroxysmal atrial fibrillation: Secondary | ICD-10-CM | POA: Diagnosis not present

## 2023-08-26 DIAGNOSIS — E039 Hypothyroidism, unspecified: Secondary | ICD-10-CM | POA: Diagnosis not present

## 2023-08-26 DIAGNOSIS — E291 Testicular hypofunction: Secondary | ICD-10-CM | POA: Diagnosis not present

## 2023-08-28 DIAGNOSIS — G4733 Obstructive sleep apnea (adult) (pediatric): Secondary | ICD-10-CM | POA: Diagnosis not present

## 2023-08-28 MED ORDER — HYDROCORTISONE 5 MG PO TABS: 10.0000 mg | ORAL_TABLET | Freq: Every day | ORAL | 1 refills | Status: DC

## 2023-08-28 NOTE — Telephone Encounter (Signed)
Note He has been using hydrocortisone tabs 5 mg. He can increase to 2 tabs each morning (10mg ) using current supply. If this is tolerated and seems helpful, let me know and I will change his prescription.

## 2023-08-28 NOTE — Addendum Note (Signed)
Addended by: Christen Butter on: 08/28/2023 01:56 PM   Modules accepted: Orders

## 2023-09-08 DIAGNOSIS — M51361 Other intervertebral disc degeneration, lumbar region with lower extremity pain only: Secondary | ICD-10-CM | POA: Diagnosis not present

## 2023-09-08 DIAGNOSIS — M9903 Segmental and somatic dysfunction of lumbar region: Secondary | ICD-10-CM | POA: Diagnosis not present

## 2023-09-08 DIAGNOSIS — M9904 Segmental and somatic dysfunction of sacral region: Secondary | ICD-10-CM | POA: Diagnosis not present

## 2023-09-08 DIAGNOSIS — M9905 Segmental and somatic dysfunction of pelvic region: Secondary | ICD-10-CM | POA: Diagnosis not present

## 2023-09-09 DIAGNOSIS — M9905 Segmental and somatic dysfunction of pelvic region: Secondary | ICD-10-CM | POA: Diagnosis not present

## 2023-09-09 DIAGNOSIS — M9904 Segmental and somatic dysfunction of sacral region: Secondary | ICD-10-CM | POA: Diagnosis not present

## 2023-09-09 DIAGNOSIS — M9903 Segmental and somatic dysfunction of lumbar region: Secondary | ICD-10-CM | POA: Diagnosis not present

## 2023-09-09 DIAGNOSIS — M51361 Other intervertebral disc degeneration, lumbar region with lower extremity pain only: Secondary | ICD-10-CM | POA: Diagnosis not present

## 2023-09-11 DIAGNOSIS — M25512 Pain in left shoulder: Secondary | ICD-10-CM | POA: Diagnosis not present

## 2023-09-11 DIAGNOSIS — M25511 Pain in right shoulder: Secondary | ICD-10-CM | POA: Diagnosis not present

## 2023-09-14 DIAGNOSIS — H524 Presbyopia: Secondary | ICD-10-CM | POA: Diagnosis not present

## 2023-09-15 ENCOUNTER — Other Ambulatory Visit: Payer: Self-pay | Admitting: Cardiovascular Disease

## 2023-09-19 ENCOUNTER — Other Ambulatory Visit: Payer: Self-pay | Admitting: Cardiology

## 2023-09-19 DIAGNOSIS — G4733 Obstructive sleep apnea (adult) (pediatric): Secondary | ICD-10-CM | POA: Diagnosis not present

## 2023-09-21 DIAGNOSIS — M51361 Other intervertebral disc degeneration, lumbar region with lower extremity pain only: Secondary | ICD-10-CM | POA: Diagnosis not present

## 2023-09-21 DIAGNOSIS — M9904 Segmental and somatic dysfunction of sacral region: Secondary | ICD-10-CM | POA: Diagnosis not present

## 2023-09-21 DIAGNOSIS — M9905 Segmental and somatic dysfunction of pelvic region: Secondary | ICD-10-CM | POA: Diagnosis not present

## 2023-09-21 DIAGNOSIS — M9903 Segmental and somatic dysfunction of lumbar region: Secondary | ICD-10-CM | POA: Diagnosis not present

## 2023-09-24 DIAGNOSIS — G4733 Obstructive sleep apnea (adult) (pediatric): Secondary | ICD-10-CM | POA: Diagnosis not present

## 2023-09-24 DIAGNOSIS — G255 Other chorea: Secondary | ICD-10-CM | POA: Diagnosis not present

## 2023-09-27 DIAGNOSIS — G4733 Obstructive sleep apnea (adult) (pediatric): Secondary | ICD-10-CM | POA: Diagnosis not present

## 2023-09-28 DIAGNOSIS — M9903 Segmental and somatic dysfunction of lumbar region: Secondary | ICD-10-CM | POA: Diagnosis not present

## 2023-09-28 DIAGNOSIS — M9905 Segmental and somatic dysfunction of pelvic region: Secondary | ICD-10-CM | POA: Diagnosis not present

## 2023-09-28 DIAGNOSIS — M9904 Segmental and somatic dysfunction of sacral region: Secondary | ICD-10-CM | POA: Diagnosis not present

## 2023-09-28 DIAGNOSIS — M51361 Other intervertebral disc degeneration, lumbar region with lower extremity pain only: Secondary | ICD-10-CM | POA: Diagnosis not present

## 2023-09-30 DIAGNOSIS — M9903 Segmental and somatic dysfunction of lumbar region: Secondary | ICD-10-CM | POA: Diagnosis not present

## 2023-09-30 DIAGNOSIS — M51361 Other intervertebral disc degeneration, lumbar region with lower extremity pain only: Secondary | ICD-10-CM | POA: Diagnosis not present

## 2023-09-30 DIAGNOSIS — M9905 Segmental and somatic dysfunction of pelvic region: Secondary | ICD-10-CM | POA: Diagnosis not present

## 2023-09-30 DIAGNOSIS — M9904 Segmental and somatic dysfunction of sacral region: Secondary | ICD-10-CM | POA: Diagnosis not present

## 2023-10-03 ENCOUNTER — Other Ambulatory Visit: Payer: Self-pay | Admitting: Neurology

## 2023-10-05 DIAGNOSIS — M9904 Segmental and somatic dysfunction of sacral region: Secondary | ICD-10-CM | POA: Diagnosis not present

## 2023-10-05 DIAGNOSIS — M51361 Other intervertebral disc degeneration, lumbar region with lower extremity pain only: Secondary | ICD-10-CM | POA: Diagnosis not present

## 2023-10-05 DIAGNOSIS — M9903 Segmental and somatic dysfunction of lumbar region: Secondary | ICD-10-CM | POA: Diagnosis not present

## 2023-10-05 DIAGNOSIS — M9905 Segmental and somatic dysfunction of pelvic region: Secondary | ICD-10-CM | POA: Diagnosis not present

## 2023-10-07 DIAGNOSIS — M9903 Segmental and somatic dysfunction of lumbar region: Secondary | ICD-10-CM | POA: Diagnosis not present

## 2023-10-07 DIAGNOSIS — M9905 Segmental and somatic dysfunction of pelvic region: Secondary | ICD-10-CM | POA: Diagnosis not present

## 2023-10-07 DIAGNOSIS — M51361 Other intervertebral disc degeneration, lumbar region with lower extremity pain only: Secondary | ICD-10-CM | POA: Diagnosis not present

## 2023-10-07 DIAGNOSIS — M9904 Segmental and somatic dysfunction of sacral region: Secondary | ICD-10-CM | POA: Diagnosis not present

## 2023-10-12 DIAGNOSIS — M9904 Segmental and somatic dysfunction of sacral region: Secondary | ICD-10-CM | POA: Diagnosis not present

## 2023-10-12 DIAGNOSIS — M51361 Other intervertebral disc degeneration, lumbar region with lower extremity pain only: Secondary | ICD-10-CM | POA: Diagnosis not present

## 2023-10-12 DIAGNOSIS — M9903 Segmental and somatic dysfunction of lumbar region: Secondary | ICD-10-CM | POA: Diagnosis not present

## 2023-10-12 DIAGNOSIS — M9905 Segmental and somatic dysfunction of pelvic region: Secondary | ICD-10-CM | POA: Diagnosis not present

## 2023-10-16 DIAGNOSIS — M9905 Segmental and somatic dysfunction of pelvic region: Secondary | ICD-10-CM | POA: Diagnosis not present

## 2023-10-16 DIAGNOSIS — M9903 Segmental and somatic dysfunction of lumbar region: Secondary | ICD-10-CM | POA: Diagnosis not present

## 2023-10-16 DIAGNOSIS — M51361 Other intervertebral disc degeneration, lumbar region with lower extremity pain only: Secondary | ICD-10-CM | POA: Diagnosis not present

## 2023-10-16 DIAGNOSIS — M9904 Segmental and somatic dysfunction of sacral region: Secondary | ICD-10-CM | POA: Diagnosis not present

## 2023-10-26 ENCOUNTER — Other Ambulatory Visit: Payer: Medicare Other

## 2023-10-26 DIAGNOSIS — G4733 Obstructive sleep apnea (adult) (pediatric): Secondary | ICD-10-CM | POA: Diagnosis not present

## 2023-10-30 ENCOUNTER — Ambulatory Visit
Admission: RE | Admit: 2023-10-30 | Discharge: 2023-10-30 | Disposition: A | Discharge status: Home / Self Care | Payer: Medicare Other | Source: Ambulatory Visit | Attending: Internal Medicine | Admitting: Internal Medicine

## 2023-10-30 DIAGNOSIS — R911 Solitary pulmonary nodule: Secondary | ICD-10-CM | POA: Diagnosis not present

## 2023-10-30 DIAGNOSIS — Z09 Encounter for follow-up examination after completed treatment for conditions other than malignant neoplasm: Secondary | ICD-10-CM | POA: Diagnosis not present

## 2023-10-31 NOTE — Progress Notes (Signed)
 History of Present Illness: . male former smoker with asthma/chronic bronchitis and allergic rhinitis, OSA PAF/ anticoagulation, aortic valve replacement, toxic encephalopathy, light sensitivity, chronic fatigue/fibromyalgia NPSG 03/24/03- AHI 88/ hr, desaturation to 75%, body weight 244 lbs PFT 06/01/17-WNL-FVC 4.46/96%, FEV1 3.71/106%, ratio 0.83, FEF 25-75% 4.64/167%, no response to dilator, TLC 107%, DLCO 101%  PFT 07/07/22- ------------------------------------------------------------------   07/28/23- 73 year old male former smoker (13.5 pkyrs) followed for Asthma/chronic Bronchitis and allergic Rhinitis,  OSA, Lung Nodule,  complicated by PAF/ Xarelto , Aortic Valve Replacement,/ IE abscess, Toxic Encephalopathy, Light sensitivity, Chronic Fatigue/fibromyalgia, CVA, 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       Wife here Increased hiccups, cough. Can't tell if there is reflux. Sweats but no fever. Unstable gait- fear of falling. We discussed trial of requip  to see if that would help limb movement any, while he reaches out to Neurology. They have felt that depomedrol inj helps, but insist he can't tolerate medrol  or prednisone tabs, which increase agitation. We discussed trial of hydrocortisone  for very low dose effect to see if he tolerates and finds it helps.  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 Dr Jesus didn't feel surgery necessary. CT chest 10/30/23- for f/u on LUL 7 mm lung nodule- 3 month f/u >>>> Discussed the use of AI scribe software for clinical note transcription with the patient, who gave verbal consent to proceed.  History of Present Illness   The patient, with a history of chronic cough, shoulder and hand issues, and a small spot in the lung, presents for a follow-up visit. The patient's cough has improved with hydrocortisone , as have the shoulder and hand issues. The patient's spouse reports that the patient's symptoms are less severe when taking two tablets of hydrocortisone  per day, and sometimes three tablets when the cough is particularly bad. The patient also takes ropinirole  (Requip ) for jerking movements, which has also helped with the symptoms.  The patient has been experiencing a new symptom of dizziness, headaches, and a sensation in the head that lasts for a minute or two. This sensation is sometimes preceded by double vision. The patient's spouse reports that during these episodes, the patient's face becomes red and he appears to lose balance.  The patient has also been experiencing some issues with his nose, ear, and throat, but surgery was deemed not beneficial. The patient had a CT scan of the chest recently, but the results are not yet available. The patient has a small spot in the lung that is being monitored.  The patient has made  lifestyle changes, including quitting drinking since September. The patient has also returned to the gym, although he has to be careful not to overdo it due to chronic fatigue.     ROS-see HPI     + = positive Constitutional:   No-   weight loss, night sweats,  fevers, chills, fatigue, lassitude. HEENT:   No-  headaches, difficulty swallowing, tooth/dental problems,  sore throat,       No-  sneezing, itching, ear ache,                    No-nasal congestion, + post nasal drip,  CV:  +chest pain, orthopnea, PND, swelling in lower extremities, anasarca,  dizziness, palpitations Resp: No- coughing up of blood.  +cough             change in color of mucus.  No- wheezing.   Skin: Clear GI:   heartburn, indigestion, abdominal pain, nausea, vomiting,  GU:  MS:  No-   joint pain or swelling.   Neuro-    +chorea, twitches and jerks, paroxysmal cough which might be same mechanism or something else. Psych:  No- change in mood or affect. + depression or anxiety.  No memory loss.  OBJ- Physical Exam Sunglasses, fidgeting/ restless General- Alert, Oriented, Affect-appropriate, Distress- none acute. +fidgeting, restless, sunglasses Skin- +eczematoid patches on lower calves, nonspecific, excoriated Lymphadenopathy- none Head- atraumatic. Stares aimlessly around room like a blind person.             Eyes- Gross vision intact, PERRLA, conjunctivae and secretions clear,  + sunglasses            Ears- Hearing, canals-normal            Nose- Clear, no-Septal dev, mucus, polyps, erosion, perforation             Throat- Mallampati II-III, mucosa , drainage- none, tonsils- atrophic Neck- flexible , trachea midline, no stridor , thyroid  nl, carotid no bruit Chest - symmetrical excursion , unlabored           Heart/CV- RRR ,  murmur+2-3/6 S, no gallop  , no rub, nl s1 s2,                            - JVD- none , edema- none, stasis changes- none, varices- none           Lung- + spastic cough, hiccups, wheeze-none, dullness-none, rub- none           Chest wall-  Abd-  Br/ Gen/ Rectal- Not done, not indicated Extrem- cyanosis- none, clubbing, none, atrophy- none, strength- nl Neuro- +constant movement of legs/ chorea  Assessment and Plan    Chronic  Cough Improvement noted with hydrocortisone . Possible tic disorder contributing to cough. -Continue hydrocortisone , adjust to 3 tablets per day as needed, but keep dose low as possible. -Refill prescription to allow for up to 3 tablets per day.  Restless Leg Syndrome Improvement noted with ropinirole  (Requip ). -Continue ropinirole , adjust to 3 tablets per day as needed. -Refill prescription to allow for up to 3 tablets per day.  Pulmonary Nodule 7mm nodule noted on CT chest performed on October 30, 2023. Awaiting official radiology report. -Await official radiology report. -Notify patient and Dr. Fleeta Ochoa of results when available.  Episodic Dizziness, Headache, and Facial Sensations New symptoms reported, possibly related to neurologic condition. -Recommend discussion with neurologist.  Alcohol Cessation Patient has stopped drinking alcohol  since September. -Encourage continued abstinence from alcohol.  Follow-up No acute issues identified today. -Schedule follow-up appointment in 6 months, or sooner if needed.

## 2023-11-02 ENCOUNTER — Encounter: Payer: Self-pay | Admitting: Internal Medicine

## 2023-11-02 ENCOUNTER — Ambulatory Visit: Payer: Medicare Other | Admitting: Internal Medicine

## 2023-11-02 VITALS — BP 140/80 | HR 66 | Ht 70.0 in | Wt 226.4 lb

## 2023-11-02 DIAGNOSIS — G2581 Restless legs syndrome: Secondary | ICD-10-CM

## 2023-11-02 DIAGNOSIS — G4733 Obstructive sleep apnea (adult) (pediatric): Secondary | ICD-10-CM

## 2023-11-02 DIAGNOSIS — R911 Solitary pulmonary nodule: Secondary | ICD-10-CM | POA: Diagnosis not present

## 2023-11-02 DIAGNOSIS — R053 Chronic cough: Secondary | ICD-10-CM

## 2023-11-02 MED ORDER — ROPINIROLE HCL 0.25 MG PO TABS: 0.2500 mg | ORAL_TABLET | Freq: Three times a day (TID) | ORAL | 12 refills | Status: DC

## 2023-11-02 MED ORDER — HYDROCORTISONE 5 MG PO TABS: ORAL_TABLET | ORAL | 5 refills | Status: DC

## 2023-11-02 NOTE — Patient Instructions (Signed)
 Let Dr Karel Jarvis know about the feeling you get in your head  Script sent refilling hydrocortisone to take 1-3 tabs daily as needed.   Script sent refilling Requip

## 2023-11-06 ENCOUNTER — Telehealth: Payer: Self-pay | Admitting: Neurology

## 2023-11-06 NOTE — Telephone Encounter (Signed)
Patients wife called to make an appt with aquino. He has had scans at [facility] is she needs to review them. He is having pain like symptoms in his skull/head but  she said its hard to describe. Sometimes it feels like its in his head then other times its around his mouth and goes into his head. It makes him feel horrible. They wasn't sure if he needed to wait till her next app or if it was routine. His blood pressure was high once. He is having dizzy spells and his balance is off.he has had more HA but they have been better. Has not had to take as much medicine for this.

## 2023-11-12 ENCOUNTER — Encounter: Payer: Self-pay | Admitting: Neurology

## 2023-11-13 NOTE — Telephone Encounter (Signed)
Ok for cancellation list,thanks

## 2023-11-20 ENCOUNTER — Telehealth: Payer: Self-pay

## 2023-11-20 NOTE — Telephone Encounter (Signed)
Pt needs a PA for his aimovig

## 2023-11-21 ENCOUNTER — Other Ambulatory Visit: Payer: Self-pay | Admitting: Cardiovascular Disease

## 2023-11-22 ENCOUNTER — Other Ambulatory Visit: Payer: Self-pay | Admitting: Internal Medicine

## 2023-11-23 ENCOUNTER — Telehealth: Payer: Self-pay | Admitting: Pharmacy Technician

## 2023-11-23 ENCOUNTER — Other Ambulatory Visit (HOSPITAL_COMMUNITY): Payer: Self-pay

## 2023-11-23 NOTE — Telephone Encounter (Signed)
Pharmacy Patient Advocate Encounter   Received notification from Pt Calls Messages that prior authorization for Aimovig 140MG /ML auto-injectors is due for renewal.   Insurance verification completed.   The patient is insured through [employer].  Action: PA required; PA submitted to above mentioned insurance via CoverMyMeds Key/confirmation #/[id number] Status is pending  PA Case ID #: [id number]

## 2023-11-23 NOTE — Telephone Encounter (Signed)
Prescription refill request for Xarelto received.  Indication:afib Last office visit:10/24 Weight:102.7  kg Age:73 Scr:0.88  10/24 CrCl:113.46  ml/min  Prescription refilled

## 2023-11-23 NOTE — Telephone Encounter (Signed)
PA request has been Started. New Encounter will be created for follow up. For additional info see Pharmacy Prior Auth telephone encounter from 11/23/23.

## 2023-11-26 ENCOUNTER — Telehealth: Payer: Self-pay | Admitting: Internal Medicine

## 2023-11-26 ENCOUNTER — Encounter: Payer: Self-pay | Admitting: Internal Medicine

## 2023-11-26 ENCOUNTER — Other Ambulatory Visit (HOSPITAL_COMMUNITY): Payer: Self-pay

## 2023-11-26 DIAGNOSIS — G4733 Obstructive sleep apnea (adult) (pediatric): Secondary | ICD-10-CM | POA: Diagnosis not present

## 2023-11-26 MED ORDER — DOXYCYCLINE HYCLATE 100 MG PO TABS: 100.0000 mg | ORAL_TABLET | Freq: Two times a day (BID) | ORAL | 0 refills | Status: DC

## 2023-11-26 MED ORDER — BENZONATATE 200 MG PO CAPS: 200.0000 mg | ORAL_CAPSULE | Freq: Three times a day (TID) | ORAL | 3 refills | Status: DC | PRN

## 2023-11-26 NOTE — Telephone Encounter (Signed)
 I called and spoke with Grant [name], pts wife. (DPR) I looked in the med list, and it stated Historical Provider I informed wife that the med would have been rx by a different provider. Wife states the bottle stated 08-13-23 by Dr Neysa. [facility] on [address]. Wife states he has a funny wet and dry cough. Green mucous, and a hard barking cough. Wife also wants to know if an antibiotic could be called in. O2 was at a 92%, no fever but pt complains of bein cold, and pt is taking his rescue inhaler a little more than as needed. Wife stated this has went on for 4 days. Please advise.

## 2023-11-26 NOTE — Telephone Encounter (Signed)
 Pharmacy Patient Advocate Encounter  Received notification from [facility] that Prior Authorization for AIMOVIG  140MG  has been DENIED.  Full denial letter will be uploaded to the media tab. See denial reason below.   PA #/Case ID/Reference #: [id number]

## 2023-11-26 NOTE — Telephone Encounter (Signed)
 Doxycycline  and tessalon  sent to drug store

## 2023-11-26 NOTE — Telephone Encounter (Signed)
 Grant [name] informed. NFN

## 2023-11-26 NOTE — Telephone Encounter (Signed)
 [name] states patient needs refill for Tessalon . Patient out of medication. Pharmacy is [facility] [address] [name] phone number is [phone].

## 2023-11-30 ENCOUNTER — Encounter: Payer: Self-pay | Admitting: Neurology

## 2023-12-01 DIAGNOSIS — M5136 Other intervertebral disc degeneration, lumbar region with discogenic back pain only: Secondary | ICD-10-CM | POA: Diagnosis not present

## 2023-12-01 DIAGNOSIS — M9904 Segmental and somatic dysfunction of sacral region: Secondary | ICD-10-CM | POA: Diagnosis not present

## 2023-12-01 DIAGNOSIS — M9905 Segmental and somatic dysfunction of pelvic region: Secondary | ICD-10-CM | POA: Diagnosis not present

## 2023-12-01 DIAGNOSIS — M9903 Segmental and somatic dysfunction of lumbar region: Secondary | ICD-10-CM | POA: Diagnosis not present

## 2023-12-03 ENCOUNTER — Other Ambulatory Visit: Payer: Self-pay | Admitting: Neurology

## 2023-12-04 DIAGNOSIS — Z0000 Encounter for general adult medical examination without abnormal findings: Secondary | ICD-10-CM | POA: Diagnosis not present

## 2023-12-04 DIAGNOSIS — M2550 Pain in unspecified joint: Secondary | ICD-10-CM | POA: Diagnosis not present

## 2023-12-04 DIAGNOSIS — I480 Paroxysmal atrial fibrillation: Secondary | ICD-10-CM | POA: Diagnosis not present

## 2023-12-04 DIAGNOSIS — I10 Essential (primary) hypertension: Secondary | ICD-10-CM | POA: Diagnosis not present

## 2023-12-04 DIAGNOSIS — E291 Testicular hypofunction: Secondary | ICD-10-CM | POA: Diagnosis not present

## 2023-12-04 DIAGNOSIS — J4520 Mild intermittent asthma, uncomplicated: Secondary | ICD-10-CM | POA: Diagnosis not present

## 2023-12-04 DIAGNOSIS — J40 Bronchitis, not specified as acute or chronic: Secondary | ICD-10-CM | POA: Diagnosis not present

## 2023-12-04 DIAGNOSIS — G43909 Migraine, unspecified, not intractable, without status migrainosus: Secondary | ICD-10-CM | POA: Diagnosis not present

## 2023-12-07 ENCOUNTER — Telehealth: Payer: Self-pay | Admitting: Neurology

## 2023-12-07 DIAGNOSIS — M25511 Pain in right shoulder: Secondary | ICD-10-CM | POA: Diagnosis not present

## 2023-12-07 DIAGNOSIS — M25512 Pain in left shoulder: Secondary | ICD-10-CM | POA: Diagnosis not present

## 2023-12-07 DIAGNOSIS — M542 Cervicalgia: Secondary | ICD-10-CM | POA: Diagnosis not present

## 2023-12-07 NOTE — Telephone Encounter (Signed)
Pt wife called she stated that Pt went to PCP on Friday and got a headache cocktail, she was advised that will follow up on PA of Aimovig denial with letter, and that I will be in touch ,

## 2023-12-07 NOTE — Telephone Encounter (Signed)
Pts wife wants our office to f/u on the PA for Aimovig.

## 2023-12-14 ENCOUNTER — Ambulatory Visit: Payer: Medicare Other | Admitting: Neurology

## 2023-12-14 ENCOUNTER — Encounter: Payer: Self-pay | Admitting: Neurology

## 2023-12-14 VITALS — BP 117/65 | HR 67 | Ht 70.0 in | Wt 224.6 lb

## 2023-12-14 DIAGNOSIS — G255 Other chorea: Secondary | ICD-10-CM | POA: Diagnosis not present

## 2023-12-14 DIAGNOSIS — G43109 Migraine with aura, not intractable, without status migrainosus: Secondary | ICD-10-CM

## 2023-12-14 MED ORDER — TOPIRAMATE 25 MG PO TABS: ORAL_TABLET | ORAL | 11 refills | Status: DC

## 2023-12-14 MED ORDER — KETOROLAC TROMETHAMINE 60 MG/2ML IM SOLN
60.0000 mg | Freq: Once | INTRAMUSCULAR | Status: AC
  Administered 2023-12-14: 60 mg via INTRAMUSCULAR

## 2023-12-14 MED ORDER — AIMOVIG 140 MG/ML ~~LOC~~ SOAJ: 1.0000 | SUBCUTANEOUS | 11 refills | Status: DC

## 2023-12-14 MED ORDER — METOCLOPRAMIDE HCL 5 MG/ML IJ SOLN
10.0000 mg | Freq: Once | INTRAMUSCULAR | Status: AC
  Administered 2023-12-14: 10 mg via INTRAMUSCULAR

## 2023-12-14 MED ORDER — RIZATRIPTAN BENZOATE 10 MG PO TABS: ORAL_TABLET | ORAL | 3 refills | Status: DC

## 2023-12-14 MED ORDER — DIPHENHYDRAMINE HCL 50 MG/ML IJ SOLN
50.0000 mg | Freq: Once | INTRAMUSCULAR | Status: AC
  Administered 2023-12-14: 25 mg via INTRAMUSCULAR

## 2023-12-14 NOTE — Patient Instructions (Signed)
 Good to see you.  Start Topiramate 25mg : Take 1 tablet every night. Please update me in a month, if no improvement and no side effects, we will plan to increase dose  2. We will work on the [employer] and Rizatriptan approvals  3. Follow-up in 4 months,call for any changes

## 2023-12-14 NOTE — Progress Notes (Unsigned)
 NEUROLOGY FOLLOW UP OFFICE NOTE  Grant [name] [id number] 06-22-1951  HISTORY OF PRESENT ILLNESS: I had the pleasure of seeing Grant [name] in follow-up in the neurology clinic on 12/14/2023.  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 He is again accompanied by his wife Grant [name] who helps supplement the history today.  Records and images were personally reviewed where available.  He was in the ER on in October 2024 for pain in his skull/head, sometimes it is in his head, other times around his mouth going into his head, making him feel horrible. He has been having dizzy spells and balance is off. I personally reviewed head CT 07/2023 which did not show any acute changes. He has been on Aimovig since June 2021 with improvement but still continued migraines. He had 5 migraines last month, but for the past couple of weeks he has had an increase in migraines that wax and wane in intensity. Once it feels like it will stop, it "goes into a different mode." He was having visual auras, usually he sees squiggly lines but recently the photosensitivity has really stepped up. He is nauseated, no vomiting. The rizatriptan helps "change the tone" but does not get rid of them. It is harder to deal with when he has the visual aura. He also takes Tylenol or Aleve. He did get the last Aimovig dose this month, insurance has denied further refills.   Before he started having this prolonged migraine, he was having episodes where he sees or hears something that would make him feel like his "brain is cringing," making him cringe and feel "like I am high." He gets nauseated/sick to his stomach, sometimes feels it in his jaw or chin. These quieted down when this current headache started. He was actually feeling better in between these episodes, up until 2 weeks ago, there would be a day or two he could walk down the block. He has stopped alcohol since 06/2023. Balance is  bad, he is bouncing off things sometimes, no falls. He feels "like I'm dead, just taking a long time to fall down." He would squat and tilt over. He is sleeping well with his CPAP, Ambien works really well for him. He sees Ortho for arthritis in his neck and shoulders, he gets injections. He is noted to have constant right leg movements, tapping movements of left foot, mouth movements. Ropinirole did help, but the movements have been worse with recent increase in migraines. He tried Ingrezza and Amantadine with side effects. He takes hydrocortisone for bronchitis.    History on Initial Assessment 03/02/2019: This is a 73 year old right-handed man with a history of OSA, atrial fibrillation on chronic anticoagulation, aortic valve replacement, depression, chronic fatigue syndrome/fibromyalgia, presenting after hospital admission last 02/16/2019 for altered mental status. He was in his usual state of health ("a little unusual all the time anyway"), working in his shop when he stopped doing one thing and could not remember what he was going to do next. As this was going on, his wife came to the shop and he does not remember much until they were riding in the car to the hospital. His wife reports that he has had baseline confusion with his chronic fatigue syndrome for more than 15 years, worse when he is tired, but this day was unusual because he was looking all over the place not knowing what he was doing, and he kept asking her  the same thing repeatedly. He would usually argue when he goes to the doctor, but this time did not argue, he was not as cognizant of how different/serious it was while he was in the hospital. His wife reported that he had been "bouncing off the edge of a migraine daily for 2-3 weeks prior." He was taking a lot of Tylenol, Aleve, and Maxalt. It appears he was back to baseline in the ER. Bloodwork was normal, UDS and EtOH level negative. I personally reviewed MRI/MRA brain which did not show any  acute changes. There was mild chronic microvascular disease. There was scattered small vessel atherosclerotic change without significant stenosis. There was an incidental finding of a 3mm focal outpouching at the left MCA bifurcation, suspicious for aneurysm. His wake and drowsy EEG was normal. Symptoms felt due to transient global amnesia or migraine. No further recurrence of similar significant confusion.  They report a complicated history since his early 34s which has been diagnosed as chronic fatigue syndrome (CFS). He takes Effexor. His wife states it has been more than 15 years where he has been confused and kind of forgetful. 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 He would have days where he speaks and thinks better, figuring out things faster, usually when he has an adrenaline burst, then would crash for a couple of days. If he gets overstimulated or surprised by a loud sound or too much information, he would "start going somewhere else" and get confused. He got lost in a store one time. It can last 1-2 hours or a whole day before he is back to his usual self. 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 He has had migraines since childhood, usually with stabbing pain in the left temporal or retroorbital regions. If he does not stop them, they may go on all day. He would have tunnel vision and see black shimmering objects, with nausea and light sensitivity, tunnel vision. His left eye would almost shut. He has been taking Maxalt every other day for the past few weeks. He has never been on a preventative medication. He took perphenazine in the past but it caused tongue swelling. He took it a week or so ago and his tongue swelled up again. They report balance issues for the past 1.5 years, he would have extreme fatigue where he feels like he would pass  out, occasional vertical diplopia. He was in the hospital while visiting the beach in 08/2018. They fell since his cardiac surgery in 2018, "more funny things have been going on since then." He does not drive. His mother and maternal grandmother had migraines. He denies any focal numbness/tingling/weakness, dysphagia. Sleep is good with CPAP machine.   Update 12/19/2022: Since his last visit with me 4 months ago, he has kindly been seen by our Movement Disorders specialist Dr. Arbutus Leas who did a careful review of history of imaging. 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 He had a repeat brain MRI with and without contrast done 11/2022 with no acute changes, unchanged small remote infarct in the left caudate nucleus. Huntington disease panel was negative. On further review of imaging dating back to 2020, there were no caudate  changes seen in 01/2019 and 12/2020. MRI in 12/2020 was ordered for these symptoms. On brain MRI in 10/2021 and moving forward (04/2022, 08/2022, 11/2022), the small remote infarct in the left caudate head is noted. After discussion with Dr. 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 Recommendations included a trial of Amantadine, and if symptoms still uncontrolled, Xenazine/tetrabenazine/Ingrezza are still possibilities. Benzodiazepine is not ideal given his history of alcohol use. He did try the Ingrezza in January, but the day after he felt bad with low heart rate, cold sweats, then the day after he had chest pressure and extra beats with HR of 59. He reports he felt erratic heart beat, chest pain. She did not give the dose over the weekend then he had another episode Sunday.    Diagnostic Data: MRI brain without contrast in 12/2020 did not show any acute changes, there was mild chronic microvascular disease in the right  paracentral pons, stable chronic microhemorrhage in the left frontal lobe.  Brain MRI in 10/2021 and moving forward (04/2022, 08/2022, 11/2022), showed small remote infarct in the left caudate head Wake and sleep EEG in 12/2020 was normal  Bloodwork for CK, aldolase, ceruloplasmin, copper, PTH, ferritin, sedimentation rate, ANA, antiphospholipid antibody, lupus anticoagulant, RPR normal in 06/2021  PAST MEDICAL HISTORY: Past Medical History:  Diagnosis Date   Allergic rhinitis    Anxiety    Arthritis    Asthma    Bladder tumor    Chronic fatigue    COVID-19 05/22/2021   Depression    06/30/17 Pt denies being depressed, reports Effexor is taken for Chronic Fatigue    Dyspnea    Enlarged prostate    Fibromyalgia    GERD (gastroesophageal reflux disease)    History of chronic bronchitis    History of migraine    light and sound sensitive   History of toxic encephalopathy    Hypothyroidism    OSA on CPAP    CPAP 14   PAF (paroxysmal atrial fibrillation) (HCC) CARDIOLOGIST -- DR Eden Emms   DX OCT 2013   Pneumonia 2023   S/P AVR (aortic valve replacement) and aortoplasty    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     albuterol (VENTOLIN HFA) 108 (90 Base) MCG/ACT inhaler INHALE 2 PUFFS INTO THE LUNGS EVERY 6 HOURS AS NEEDED FOR WHEEZING OR SHORTNESS OF BREATH 18 g 12   ALPRAZolam (XANAX) 0.25 MG tablet Take 1 tablet (0.25 mg total) by mouth 3 (three) times daily as needed for anxiety. 15 tablet 0   aluminum hydroxide-magnesium carbonate (GAVISCON) 95-358 MG/15ML SUSP Take 15 mLs by mouth 3 (three) times daily as needed for indigestion or heartburn.     azelastine (ASTELIN) 137 MCG/SPRAY nasal  spray Place 1 spray into the nose 2 (two) times daily as needed for rhinitis or allergies.     benzonatate (TESSALON) 200 MG capsule Take 1 capsule (200 mg total) by mouth 3 (three) times daily as needed for cough. 50 capsule 3   budesonide-formoterol (SYMBICORT) 160-4.5 MCG/ACT inhaler Inhale 2 puffs into the lungs 2 (two) times daily. in the morning and at bedtime. 10.2 g  12   cetirizine (ZYRTEC) 10 MG tablet Take 10 mg by mouth every morning.     colchicine (COLCRYS) 0.6 MG tablet Take 0.6 mg by mouth daily.     dextromethorphan (DELSYM) 30 MG/5ML liquid Take 30 mg by mouth daily as needed for cough.     diclofenac Sodium (VOLTAREN) 1 % GEL Apply 2 g topically 4 (four) times daily as needed (to affected areas- for arthritis pain.).     diphenhydrAMINE (BENADRYL) 25 mg capsule Take 50 mg by mouth at bedtime as needed for allergies.     Erenumab-aooe (AIMOVIG) 140 MG/ML SOAJ Inject 140 mg into the skin every 28 (twenty-eight) days. 1 mL 11   Ferrous Sulfate (IRON) 142 (45 Fe) MG TBCR Take 142 mg by mouth in the morning and at bedtime.     Fiber 500 MG CAPS Take 1,000 mg by mouth in the morning.     finasteride (PROSCAR) 5 MG tablet Take 5 mg by mouth at bedtime.      furosemide (LASIX) 40 MG tablet TAKE 2 TABLETS(80 MG) BY MOUTH DAILY 180 tablet 3   hydrocortisone (CORTEF) 5 MG tablet 1-3 tabs daily for bronchitis 90 tablet 5   ipratropium (ATROVENT) 0.06 % nasal spray Place 2 sprays into both nostrils 2 (two) times daily.     ipratropium-albuterol (DUONEB) 0.5-2.5 (3) MG/3ML SOLN Take 3 mLs by nebulization every 6 (six) hours as needed. 360 mL 3   levothyroxine (SYNTHROID, LEVOTHROID) 112 MCG tablet Take 112 mcg by mouth daily before breakfast.      liothyronine (CYTOMEL) 25 MCG tablet Take 25 mcg by mouth daily before breakfast.     losartan (COZAAR) 50 MG tablet TAKE 1 TABLET BY MOUTH EVERY DAY 30 tablet 6   Menthol, Topical Analgesic, (BLUE-EMU MAXIMUM STRENGTH EX) Apply 1 application  topically 3 (three) times daily as needed (arthritis pain.).     metoprolol tartrate (LOPRESSOR) 50 MG tablet TAKE 1 TABLET(50 MG) BY MOUTH TWICE DAILY 180 tablet 3   Multiple Vitamin (MULTIVITAMIN WITH MINERALS) TABS tablet Take 1 tablet by mouth daily with breakfast.     naproxen sodium (ALEVE) 220 MG tablet Take 220 mg by mouth 2 (two) times daily as needed (pain/headache).     olopatadine (PATANOL) 0.1 % ophthalmic solution Place 1 drop into both eyes 2 (two) times daily as needed for allergies.     omeprazole (PRILOSEC) 40 MG capsule Take 1 capsule (40 mg total) by mouth 2 (two) times daily. 180 capsule 1   PRESCRIPTION MEDICATION See admin instructions. CPAP- At bedtime     rizatriptan (MAXALT) 10 MG tablet TAKE 1 TABLET BY MOUTH AS NEEDED FOR MIGRAINE. MAY REPEAT IN 2 HOURS IF NEEDED 30 tablet 11   rOPINIRole (REQUIP) 0.25 MG tablet Take 1 tablet (0.25 mg total) by mouth 3 (three) times daily. (Patient taking differently: Take 0.25 mg by mouth 2 (two) times daily.) 90 tablet 12   saccharomyces boulardii (FLORASTOR) 250 MG capsule Take 250 mg by mouth every morning.     sodium chloride (OCEAN) 0.65 % SOLN nasal spray Place 1 spray into both nostrils 2 (two) times daily.     Testosterone Cypionate 200 MG/ML SOLN Inject 200 mg into the muscle See admin instructions. Inject 200 mg (1 ml) intramuscularly once every 7-10 days     venlafaxine XR (EFFEXOR-XR) 75 MG 24 hr capsule TAKE 1 CAPSULE(75 MG) BY MOUTH DAILY WITH BREAKFAST 60 capsule 0   XARELTO 20 MG TABS tablet TAKE 1  TABLET(20 MG) BY MOUTH DAILY WITH SUPPER 90 tablet 3   zolpidem (AMBIEN) 10 MG tablet Take 10 mg by mouth at bedtime as needed for sleep.     Guaifenesin (MUCINEX MAXIMUM STRENGTH) 1200 MG TB12 Take 1,200 mg by mouth 2 (two) times daily as needed (cough).     Current Facility-Administered Medications on File Prior to Visit  Medication Dose Route Frequency Provider Last Rate Last Admin   methylPREDNISolone acetate  (DEPO-MEDROL) injection (RADIOLOGY ONLY) 120 mg  120 mg Intramuscular Once Omar Person, MD        ALLERGIES: Allergies  Allergen Reactions   Annamaria Helling Tosylate] Other (See Comments)    PVC's   Perphenazine Swelling and Other (See Comments)    Tongue swelling   Amantadines     Feels bad   Amiodarone Swelling and Other (See Comments)    Tongue swelling   Codeine Itching   Levaquin [Levofloxacin] Other (See Comments)    malaise   Prednisone Itching and Other (See Comments)    CAN TOLERATE DEPO. PREDNISONE BY MOUTH CAUSES MOOD CHANGES, INSOMNIA, AND ITCHING.    Tramadol Other (See Comments)    Delusions/hallucinations from high doses     FAMILY HISTORY: Family History  Problem Relation Age of Onset   Aortic aneurysm Mother 49       cause of death   Other Father        motor vehicle accident   Heart disease Other        family history    SOCIAL HISTORY: Social History   Socioeconomic History   Marital status: Married    Spouse name: Not on file   Number of children: Not on file   Years of education: Not on file   Highest education level: Not on file  Occupational History   Occupation: disabled    Comment: owner of buisness  Tobacco Use   Smoking status: Former    Current packs/day: 0.00    Average packs/day: 0.5 packs/day for 27.0 years (13.5 ttl pk-yrs)    Types: Cigarettes    Start date: 89    Quit date: 10/21/1979    Years since quitting: 44.1   Smokeless tobacco: Never  Vaping Use   Vaping status: Never Used  Substance and Sexual Activity   Alcohol use: Yes    Alcohol/week: 10.0 standard drinks of alcohol    Types: 4 Cans of beer, 6 Standard drinks or equivalent per week   Drug use: Never   Sexual activity: Not Currently  Other Topics Concern   Not on file  Social History Narrative   Right handed   One story home   Drinks no caffeine   Lives with wife    Social Drivers of Health   Financial Resource Strain: Unknown (11/22/2021)    Overall Financial Resource Strain (CARDIA)    Difficulty of Paying Living Expenses: Patient declined  Food Insecurity: No Food Insecurity (03/27/2023)   Hunger Vital Sign    Worried About Running Out of Food in the Last Year: Never true    Ran Out of Food in the Last Year: Never true  Transportation Needs: No Transportation Needs (03/27/2023)   PRAPARE - Administrator, Civil Service (Medical): No    Lack of Transportation (Non-Medical): No  Physical Activity: Unknown (11/22/2021)   Exercise Vital Sign    Days of Exercise per Week: 4 days    Minutes of Exercise per Session: Not on file  Stress: Unknown (11/22/2021)  [employer] of Occupational Health - Occupational Stress Questionnaire    Feeling of Stress : Patient declined  Social Connections: Unknown (11/22/2021)   Social Connection and Isolation Panel [NHANES]    Frequency of Communication with Friends and Family: More than three times a week    Frequency of Social Gatherings with Friends and Family: Once a week    Attends Religious Services: Patient declined    Database administrator or Organizations: No    Attends Engineer, structural: Not on file    Marital Status: Married  Intimate Partner Violence: Patient Unable To Answer (03/27/2023)   Humiliation, Afraid, Rape, and Kick questionnaire    Fear of Current or Ex-Partner: Patient unable to answer    Emotionally Abused: Patient unable to answer    Physically Abused: Patient unable to answer    Sexually Abused: Patient unable to answer     PHYSICAL EXAM: Vitals:   12/14/23 1552  BP: 117/65  Pulse: 67  SpO2: 96%   General: No acute distress, wearing sunglasses indoors due to photosensitivity Head:  Normocephalic/atraumatic Skin/Extremities: No rash, no edema Neurological Exam: alert and awake. No aphasia or dysarthria. Fund of knowledge is appropriate.  Attention and concentration are normal.   Cranial nerves: Pupils equal, round. Extraocular  movements intact with no nystagmus. Visual fields full.  No facial asymmetry.  Motor: Bulk and tone normal, muscle strength 5/5 throughout with no pronator drift.   Finger to nose testing intact.  Gait slow and cautious, no ataxia. He has chorea in both UE and LE, R>L, he also has some orobuccolingal dyskinesias.    IMPRESSION: This is a 73 yo RH man with a history of OSA, atrial fibrillation on chronic anticoagulation, aortic valve replacement, depression, chronic fatigue syndrome/fibromyalgia, who had an episode of confusion last April 2020 and December 2020. MRI brain and EEG unremarkable, working diagnosis of complicated migraine. He has been on Aimovig since 2021. Insurance denied Aimovig, appeal will be sent as he has had no further confusional episodes and improvement in migraines.  He has had a recent increase in migraines the past 2 weeks and was given a migraine cocktail in the office today. He has prn Maxalt for rescue, we discussed minimizing rescue medication to 2-3 a week to avoid rebound headaches. We discussed adding low dose Topiramate 25mg  at bedtime for migraine prophylaxis as well, side effects discussed. He continues to have chorea R>L, he has tried [place] and Amantadine with side effects, they feel Ropinirole helped some. Follow-up in 4 months, call for any changes.   Thank you for allowing me to participate in his care.  Please do not hesitate to call for any questions or concerns.    Patrcia Dolly, M.D.   CC: Dr. Tiburcio Pea

## 2023-12-15 NOTE — Telephone Encounter (Signed)
 Seen 12/14/23

## 2023-12-21 DIAGNOSIS — M9905 Segmental and somatic dysfunction of pelvic region: Secondary | ICD-10-CM | POA: Diagnosis not present

## 2023-12-21 DIAGNOSIS — M9904 Segmental and somatic dysfunction of sacral region: Secondary | ICD-10-CM | POA: Diagnosis not present

## 2023-12-21 DIAGNOSIS — M5136 Other intervertebral disc degeneration, lumbar region with discogenic back pain only: Secondary | ICD-10-CM | POA: Diagnosis not present

## 2023-12-21 DIAGNOSIS — M9903 Segmental and somatic dysfunction of lumbar region: Secondary | ICD-10-CM | POA: Diagnosis not present

## 2023-12-22 ENCOUNTER — Telehealth: Payer: Self-pay | Admitting: Neurology

## 2023-12-22 DIAGNOSIS — M25512 Pain in left shoulder: Secondary | ICD-10-CM | POA: Diagnosis not present

## 2023-12-22 DIAGNOSIS — M25511 Pain in right shoulder: Secondary | ICD-10-CM | POA: Diagnosis not present

## 2023-12-22 DIAGNOSIS — M542 Cervicalgia: Secondary | ICD-10-CM | POA: Diagnosis not present

## 2023-12-22 NOTE — Telephone Encounter (Signed)
 Did it help last time? If it did, ok to do again, thanks

## 2023-12-22 NOTE — Telephone Encounter (Signed)
 Patients wife called in, matheau has another migraine and it wont go away. She is wanting to know if he can come in tomorrow to get another cocktail.

## 2023-12-23 NOTE — Telephone Encounter (Signed)
 Called pt and talked to wife. He is not able at this time to come in for the cocktail, but if things change they will call before coming in,

## 2023-12-24 DIAGNOSIS — G4733 Obstructive sleep apnea (adult) (pediatric): Secondary | ICD-10-CM | POA: Diagnosis not present

## 2023-12-25 ENCOUNTER — Telehealth: Payer: Self-pay | Admitting: Pharmacist

## 2023-12-25 ENCOUNTER — Other Ambulatory Visit (HOSPITAL_COMMUNITY): Payer: Self-pay

## 2023-12-25 ENCOUNTER — Telehealth: Payer: Self-pay | Admitting: Pharmacy Technician

## 2023-12-25 NOTE — Telephone Encounter (Signed)
 Pharmacy Patient Advocate Encounter   Received notification from CoverMyMeds that prior authorization for AIMOVIG 140MG  is required/requested.   Insurance verification completed.   The patient is insured through [facility] .   Per test claim: PA required; PA submitted to above mentioned insurance via CoverMyMeds Key/confirmation #/EOC [facility] Status is pending

## 2023-12-25 NOTE — Telephone Encounter (Signed)
 Expedited appeal has been submitted for Aimovig. Will advise when response is received. Appeal letter and supporting documentation have been faxed to [phone] on 12/25/2023 @4 :29 am.  Thank you, Dellie Burns, PharmD Clinical Pharmacist  [facility]  Direct Dial: [phone]

## 2023-12-25 NOTE — Telephone Encounter (Signed)
 Pharmacy Patient Advocate Encounter  Received notification from [facility] that Prior Authorization for AIMOVIG 140MG  has been DENIED.  Full denial letter will be uploaded to the media tab. See denial reason below.

## 2023-12-26 DIAGNOSIS — M542 Cervicalgia: Secondary | ICD-10-CM | POA: Diagnosis not present

## 2023-12-28 ENCOUNTER — Telehealth: Payer: Self-pay | Admitting: Pharmacist

## 2023-12-28 ENCOUNTER — Other Ambulatory Visit (HOSPITAL_COMMUNITY): Payer: Self-pay

## 2023-12-28 NOTE — Telephone Encounter (Signed)
 [employer] has approved the appeal for Aimovig, effective 12/28/2023 through 12/27/2024.

## 2023-12-29 DIAGNOSIS — K080 Exfoliation of teeth due to systemic causes: Secondary | ICD-10-CM | POA: Diagnosis not present

## 2023-12-30 DIAGNOSIS — M5481 Occipital neuralgia: Secondary | ICD-10-CM | POA: Diagnosis not present

## 2023-12-30 DIAGNOSIS — M47812 Spondylosis without myelopathy or radiculopathy, cervical region: Secondary | ICD-10-CM | POA: Diagnosis not present

## 2023-12-31 DIAGNOSIS — M5481 Occipital neuralgia: Secondary | ICD-10-CM | POA: Diagnosis not present

## 2024-01-20 ENCOUNTER — Other Ambulatory Visit: Payer: Self-pay | Admitting: Surgery

## 2024-01-20 DIAGNOSIS — Z9889 Other specified postprocedural states: Secondary | ICD-10-CM | POA: Diagnosis not present

## 2024-01-20 DIAGNOSIS — M6208 Separation of muscle (nontraumatic), other site: Secondary | ICD-10-CM | POA: Diagnosis not present

## 2024-01-20 DIAGNOSIS — R194 Change in bowel habit: Secondary | ICD-10-CM | POA: Diagnosis not present

## 2024-01-20 DIAGNOSIS — R1033 Periumbilical pain: Secondary | ICD-10-CM

## 2024-01-20 DIAGNOSIS — E66811 Obesity, class 1: Secondary | ICD-10-CM | POA: Diagnosis not present

## 2024-01-20 DIAGNOSIS — Z8719 Personal history of other diseases of the digestive system: Secondary | ICD-10-CM

## 2024-01-25 DIAGNOSIS — G4733 Obstructive sleep apnea (adult) (pediatric): Secondary | ICD-10-CM | POA: Diagnosis not present

## 2024-02-02 ENCOUNTER — Other Ambulatory Visit: Payer: Self-pay | Admitting: Neurology

## 2024-02-09 ENCOUNTER — Other Ambulatory Visit: Payer: Self-pay | Admitting: Neurology

## 2024-02-12 DIAGNOSIS — M47812 Spondylosis without myelopathy or radiculopathy, cervical region: Secondary | ICD-10-CM | POA: Diagnosis not present

## 2024-02-12 DIAGNOSIS — M5481 Occipital neuralgia: Secondary | ICD-10-CM | POA: Diagnosis not present

## 2024-02-18 DIAGNOSIS — M5481 Occipital neuralgia: Secondary | ICD-10-CM | POA: Diagnosis not present

## 2024-02-22 DIAGNOSIS — R972 Elevated prostate specific antigen [PSA]: Secondary | ICD-10-CM | POA: Diagnosis not present

## 2024-02-24 DIAGNOSIS — G4733 Obstructive sleep apnea (adult) (pediatric): Secondary | ICD-10-CM | POA: Diagnosis not present

## 2024-02-29 DIAGNOSIS — M9904 Segmental and somatic dysfunction of sacral region: Secondary | ICD-10-CM | POA: Diagnosis not present

## 2024-02-29 DIAGNOSIS — M51361 Other intervertebral disc degeneration, lumbar region with lower extremity pain only: Secondary | ICD-10-CM | POA: Diagnosis not present

## 2024-02-29 DIAGNOSIS — M9905 Segmental and somatic dysfunction of pelvic region: Secondary | ICD-10-CM | POA: Diagnosis not present

## 2024-02-29 DIAGNOSIS — M9903 Segmental and somatic dysfunction of lumbar region: Secondary | ICD-10-CM | POA: Diagnosis not present

## 2024-03-02 DIAGNOSIS — N401 Enlarged prostate with lower urinary tract symptoms: Secondary | ICD-10-CM | POA: Diagnosis not present

## 2024-03-02 DIAGNOSIS — R3911 Hesitancy of micturition: Secondary | ICD-10-CM | POA: Diagnosis not present

## 2024-03-03 DIAGNOSIS — M9905 Segmental and somatic dysfunction of pelvic region: Secondary | ICD-10-CM | POA: Diagnosis not present

## 2024-03-03 DIAGNOSIS — M9904 Segmental and somatic dysfunction of sacral region: Secondary | ICD-10-CM | POA: Diagnosis not present

## 2024-03-03 DIAGNOSIS — M51361 Other intervertebral disc degeneration, lumbar region with lower extremity pain only: Secondary | ICD-10-CM | POA: Diagnosis not present

## 2024-03-03 DIAGNOSIS — M9903 Segmental and somatic dysfunction of lumbar region: Secondary | ICD-10-CM | POA: Diagnosis not present

## 2024-03-04 DIAGNOSIS — M545 Low back pain, unspecified: Secondary | ICD-10-CM | POA: Diagnosis not present

## 2024-03-04 DIAGNOSIS — M791 Myalgia, unspecified site: Secondary | ICD-10-CM | POA: Diagnosis not present

## 2024-03-05 DIAGNOSIS — M545 Low back pain, unspecified: Secondary | ICD-10-CM | POA: Diagnosis not present

## 2024-03-09 DIAGNOSIS — M545 Low back pain, unspecified: Secondary | ICD-10-CM | POA: Diagnosis not present

## 2024-03-16 ENCOUNTER — Telehealth: Payer: Self-pay

## 2024-03-16 NOTE — Telephone Encounter (Signed)
 Patient with diagnosis of afib on Xarelto  for anticoagulation.    Procedure:  LUMBAR DECOMPRESSION  Date of procedure: TBD   CHA2DS2-VASc Score = 5   This indicates a 7.2% annual risk of stroke. The patient's score is based upon: CHF History: 0 HTN History: 1 Diabetes History: 0 Stroke History: 2 Vascular Disease History: 1 Age Score: 1 Gender Score: 0      CrCl 93 ml/min Platelet count 150  Patient has not had an Afib/aflutter ablation within the last 3 months or DCCV within the last 30 days  Stroke in 2018  Per office protocol, patient can hold Xarelto  for 3 days prior to procedure.    **This guidance is not considered finalized until pre-operative APP has relayed final recommendations.**

## 2024-03-16 NOTE — Telephone Encounter (Signed)
 S/W pt spouse and scheduled IN OFFICE Preop appt 03/21/24 with Theotis Flake, PA-C  Pt spouse stated that she talked with the surgeons office and looking at 03/31/24 for procedure.

## 2024-03-16 NOTE — Telephone Encounter (Signed)
   Pre-operative Risk Assessment    Patient Name: Grant [name]  DOB: 1951-04-05 MRN: [id number]   Date of last office visit: 08/11/23 Janelle Mediate, MD Date of next office visit: NONE   Request for Surgical Clearance    Procedure:  LUMBAR DECOMPRESSION  Date of Surgery:  Clearance TBD                                Surgeon:  DR Orvan Blanch Surgeon's Group or Practice Name:  [facility] Phone number:  [phone] Fax number:  [phone]  ATTN: Avanell Bob STONE   Type of Clearance Requested:   - Medical  - Pharmacy:  Hold Rivaroxaban  (Xarelto )     Type of Anesthesia:  Not Indicated   Additional requests/questions:    SignedCollin Deal   03/16/2024, 7:49 AM

## 2024-03-16 NOTE — Telephone Encounter (Signed)
   Name: Grant [name]  DOB: October 27, 1950  MRN: [id number]  Primary Cardiologist: Janelle Mediate, MD  Chart reviewed as part of pre-operative protocol coverage. Per last office visit note with Dr. Stann Earnest on 08/11/23, 6 month follow up recommended. As such, he will require a follow-up in-office visit in order to better assess preoperative cardiovascular risk.  Pre-op covering staff: - Please schedule appointment and call patient to inform them. If patient already had an upcoming appointment within acceptable timeframe, please add "pre-op clearance" to the appointment notes so provider is aware. - Please contact requesting surgeon's office via preferred method (i.e, phone, fax) to inform them of need for appointment prior to surgery.  Per pharmacy: Patient with diagnosis of afib on Xarelto  for anticoagulation.     Procedure:  LUMBAR DECOMPRESSION  Date of procedure: TBD     CHA2DS2-VASc Score = 5   This indicates a 7.2% annual risk of stroke. The patient's score is based upon: CHF History: 0 HTN History: 1 Diabetes History: 0 Stroke History: 2 Vascular Disease History: 1 Age Score: 1 Gender Score: 0       CrCl 93 ml/min Platelet count 150   Patient has not had an Afib/aflutter ablation within the last 3 months or DCCV within the last 30 days   Stroke in 2018   Per office protocol, patient can hold Xarelto  for 3 days prior to procedure.      Leala Prince, PA-C  03/16/2024, 3:44 PM

## 2024-03-16 NOTE — Telephone Encounter (Signed)
 Pt spouse called in to sch. Offered 03/21/24 (next available). She said that is too long. Please advise.

## 2024-03-16 NOTE — Telephone Encounter (Signed)
 Left message for patient to call our office to schedule an IN OFFICE Preop Appt.

## 2024-03-17 NOTE — Telephone Encounter (Signed)
 Requesting office send duplicate. Pt has appt 03/21/24 with Reesa Cannon PA. Once pt has been cleared we will send clearance notes and any recommendations.

## 2024-03-18 DIAGNOSIS — M791 Myalgia, unspecified site: Secondary | ICD-10-CM | POA: Diagnosis not present

## 2024-03-18 DIAGNOSIS — M545 Low back pain, unspecified: Secondary | ICD-10-CM | POA: Diagnosis not present

## 2024-03-18 NOTE — Progress Notes (Signed)
 Cardiology Office Note:  .   Date:  03/21/2024  ID:  Grant [name], DOB 23-Oct-1950, MRN [id number] PCP: Roselind Congo, MD  [facility] Providers Cardiologist:  Janelle Mediate, MD    History of Present Illness: .   Grant [name] is a 73 y.o. male with history of PAF, HTN, OSA on CPAP with AVR PVT  for bicuspid valve 2018 Bentall  (25mm Edwards pericardial Magna Ease)  No CAD at cath done 06/12/17 prior to surgery    Had recurrent PAF 07/28/17 converted with iv cardizem  and seen by EP Dr Carolynne Citron who recommended amiodarone  200 mg daily and changed back to  xarelto  for anticoagulation    History of  angioedema. 2020 seen by Dr  Jarome Merritt ER and given steroids and benadryl   Was not On ACE or ARB He does not tolerate oral steroids and given zyrtec pepcid  and steroid injection Told to discuss with primary stopping Tilafon (antipsychoitic)  and Proscar  which have been implicated in angioedema Had recurrence off amiodarone  so clearly not this Had ? Global amnesia episode April 2020 MRI head normal      TTE: 05/01/21 EF 50-55% chronically mildly elevated gradients 25 mm [facility] Ease valve mean 21 mmHg  Sepsis 10/2021 and grew out strep mitis/oralis on blood cultures.  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  A gated cardiac CTA was also suspicious but felt that circumferential thickening could be related to pledget material from the time of surgery.  Back in afib 07/06/23 He deferred Tikosyn Rx [facility] 07/23/23 converted after one shock to NSR Was still in NSR when seen at Afib clinic 07/30/23. Trouble with tardive dyskinesia followed by neuro.  Patient here for preop clearance. No palpitations or recurrent Afib. Denies chest pain, dyspnea, edema. Walking 1/4 mile-10 min almost daily. Goes to the gym and does light weights. Trouble with back pain.     ROS:    Studies Reviewed: Aaron Aas    EKG  Interpretation Date/Time:  Monday March 21 2024 09:19:55 EDT Ventricular Rate:  92 PR Interval:  162 QRS Duration:  122 QT Interval:  372 QTC Calculation: 460 R Axis:   -61  Text Interpretation: Normal sinus rhythm Left anterior fascicular block Left ventricular hypertrophy with QRS widening and repolarization abnormality ( R in aVL , Cornell product ) When compared with ECG of 14-Aug-2023 08:14, PREVIOUS ECG IS PRESENT Confirmed by Theotis Flake [id number]) on 03/21/2024 9:21:51 AM    Prior CV Studies:   Echo 02/2023 IMPRESSIONS     1. Left ventricular ejection fraction, by estimation, is 55 to 60%. The  left ventricle has normal function. The left ventricle has no regional  wall motion abnormalities. There is mild left ventricular hypertrophy.  Left ventricular diastolic parameters  were normal.   2. Right ventricular systolic function is normal. The right ventricular  size is normal. There is moderately elevated pulmonary artery systolic  pressure.   3. Left atrial size was moderately dilated.   4. The mitral valve is abnormal. Trivial mitral valve regurgitation. No  evidence of mitral stenosis.   5. 25 mm Edwards bioprosthetic valve mild calcification of leaflets  gradients stable since prior TTE cone 02/28/22 . The aortic valve has been  repaired/replaced. Aortic valve regurgitation is not visualized. No aortic  stenosis is present. There is a 25  mm Edwards bioprosthetic valve present in the aortic position. Procedure  Date: 10/28/21.   6.  Aortic dilatation noted. There is mild dilatation of the aortic root,  measuring 38 mm.   7. The inferior vena cava is normal in size with greater than 50%  respiratory variability, suggesting right atrial pressure of 3 mmHg.   Coronary CTA 10/2021 MPRESSION: 1. Coronary calcium  score of 3.9. This was 20th percentile for age-, sex, and race-matched controls.   2. S/p aortic valve replacement with 25 mm Magna and aortic root replacement with  concerns for aortic root abscess described above.   3. S/p coronary reimplantation.   4. 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 Mid LAD myocardial bridge (normal)   5. Minimal CAD in the distal RCA (<25%).   [name], MD       Risk Assessment/Calculations:    CHA2DS2-VASc Score = 5   This indicates a 7.2% annual risk of stroke. The patient's score is based upon: CHF History: 0 HTN History: 1 Diabetes History: 0 Stroke History: 2 Vascular Disease History: 1 Age Score: 1 Gender Score: 0            Physical Exam:   VS:  BP 112/68 (BP Location: Left Arm, Patient Position: Sitting, Cuff Size: Normal)   Pulse 92   Ht 5\' 10"  (1.778 m)   Wt 226 lb 12.8 oz (102.9 kg)   SpO2 98%   BMI 32.54 kg/m    Wt Readings from Last 3 Encounters:  03/21/24 226 lb 12.8 oz (102.9 kg)  12/14/23 224 lb 9.6 oz (101.9 kg)  11/02/23 226 lb 6.4 oz (102.7 kg)    GEN: Obese, in no acute distress NECK: No JVD; No carotid bruits CARDIAC:  RRR, no murmurs, rubs, gallops RESPIRATORY:  Clear to auscultation without rales, wheezing or rhonchi  ABDOMEN: Soft, non-tender, non-distended EXTREMITIES:  No edema; No deformity   ASSESSMENT AND PLAN: .    Preop clearance for Lumbar decompression by Dr. Orvan Blanch- patient is on Xarelto  and has been cleared by our pharmacy team to hold Xarelto  3 days prior to surgery. Patients METs>5 and he's a reasonable risk to proceed with above surgery. He is at risk of recurrent Afib with his prior history.  According to the Revised Cardiac Risk Index (RCRI), his Perioperative Risk of Major Cardiac Event is (%): 6.6  His Functional Capacity in METs is: 5.62 according to the Duke Activity Status Index (DASI).   PAF - on xarelto  continue beta blocker off amiodarone  now [facility] by Dr Marven Slimmer 11/28/21 and Dr Audery Blazing 07/30/23 Patient deferred AAT with Tikosyn as it requires hospitalization    CAD-mild 25-49% LAD and <25%RCA on CT 2023-no angina    HTN - Well  controlled.  Continue current medications and low sodium Dash type diet.      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 Matt Song 07/02/17. TTE done 03/05/23 EF 55-60% mean gradient 26 peak 45 AVA 1.8 cm2 DVI 0.42 Gradients stable compared to TTE done 02/28/22    OSA -Compliant with CPAP.    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 F/U allergist cannot take oral steroids due to MS changes     Obesity-has lost 80 lbs in 1 1/2 yrs.    Tardive Dyskinesia: followed by neurology         Dispo: f/u Dr. Stann Earnest 6 months  Signed, Theotis Flake, PA-C

## 2024-03-21 ENCOUNTER — Encounter: Payer: Self-pay | Admitting: Physician Assistant

## 2024-03-21 ENCOUNTER — Ambulatory Visit: Attending: Physician Assistant | Admitting: Physician Assistant

## 2024-03-21 VITALS — BP 112/68 | HR 92 | Ht 70.0 in | Wt 226.8 lb

## 2024-03-21 DIAGNOSIS — G4733 Obstructive sleep apnea (adult) (pediatric): Secondary | ICD-10-CM

## 2024-03-21 DIAGNOSIS — Z952 Presence of prosthetic heart valve: Secondary | ICD-10-CM

## 2024-03-21 DIAGNOSIS — I10 Essential (primary) hypertension: Secondary | ICD-10-CM | POA: Diagnosis not present

## 2024-03-21 DIAGNOSIS — I480 Paroxysmal atrial fibrillation: Secondary | ICD-10-CM | POA: Diagnosis not present

## 2024-03-21 DIAGNOSIS — T783XXD Angioneurotic edema, subsequent encounter: Secondary | ICD-10-CM

## 2024-03-21 DIAGNOSIS — E66811 Obesity, class 1: Secondary | ICD-10-CM

## 2024-03-21 DIAGNOSIS — Z01818 Encounter for other preprocedural examination: Secondary | ICD-10-CM | POA: Diagnosis not present

## 2024-03-21 DIAGNOSIS — G2401 Drug induced subacute dyskinesia: Secondary | ICD-10-CM

## 2024-03-21 NOTE — Patient Instructions (Signed)
 Medication Instructions:  Your physician recommends that you continue on your current medications as directed. Please refer to the Current Medication list given to you today.  *If you need a refill on your cardiac medications before your next appointment, please call your pharmacy*  Lab Work: NONE If you have labs (blood work) drawn today and your tests are completely normal, you will receive your results only by: MyChart Message (if you have MyChart) OR A paper copy in the mail If you have any lab test that is abnormal or we need to change your treatment, we will call you to review the results.  Testing/Procedures: NONE  Follow-Up: At [facility], you and your health needs are our priority.  As part of our continuing mission to provide you with exceptional heart care, our providers are all part of one team.  This team includes your primary Cardiologist (physician) and Advanced Practice Providers or APPs (Physician Assistants and Nurse Practitioners) who all work together to provide you with the care you need, when you need it.  Your next appointment:   6 month(s)  Provider:   Janelle Mediate, MD   We recommend signing up for the patient portal called "MyChart".  Sign up information is provided on this After Visit Summary.  MyChart is used to connect with patients for Virtual Visits (Telemedicine).  Patients are able to view lab/test results, encounter notes, upcoming appointments, etc.  Non-urgent messages can be sent to your provider as well.   To learn more about what you can do with MyChart, go to [email or web address].

## 2024-03-23 ENCOUNTER — Ambulatory Visit: Payer: Self-pay | Admitting: Orthopedic Surgery

## 2024-03-23 DIAGNOSIS — M5126 Other intervertebral disc displacement, lumbar region: Secondary | ICD-10-CM

## 2024-03-23 NOTE — H&P (View-Only) (Signed)
 Grant [name] is an 73 y.o. male.   Chief Complaint: back and leg pain HPI: Location: low back Severity: pain level 6/10 Duration: 5 days Timing: acute Context: bending (to put on his shoe) Alleviating Factors: position change Aggravating Factors: sitting Associated Symptoms: tingling; radiation down leg Prior Imaging: MRI Previous Injections: 03/04/24 toradol  IM injection 03/04/24 trigger point injections Notes: Patient was seen by Dr. Dante Dyer on 03/04/24 and was given Toradol  IM and a trigger point injection. Patient was prescribed Percocet Referred by Dr. Dante Dyer. No fevers chills change in bowel or bladder function he was referred for surgical consultation.  He reports increasing numbness and weakness on the left. Severe pain he is taken Percocet without much avail. He also has Robaxin . He has taken lorazepam . It radiates down the left leg there is numbness around his knee weakness when going up and down stairs  Past Medical History:  Diagnosis Date   Allergic rhinitis    Anxiety    Arthritis    Asthma    Bladder tumor    Chronic fatigue    COVID-19 05/22/2021   Depression    06/30/17 Pt denies being depressed, reports Effexor  is taken for Chronic Fatigue    Dyspnea    Enlarged prostate    Fibromyalgia    GERD (gastroesophageal reflux disease)    History of chronic bronchitis    History of migraine    light and sound sensitive   History of toxic encephalopathy    Hypothyroidism    OSA on CPAP    CPAP 14   PAF (paroxysmal atrial fibrillation) (HCC) CARDIOLOGIST -- DR Stann Earnest   DX OCT 2013   Pneumonia 2023   S/P AVR (aortic valve replacement) and aortoplasty    Stroke (HCC)    has post stroke chorea   Unspecified essential hypertension    Urethral tumor    PROSTATIC    Past Surgical History:  Procedure Laterality Date   BENTALL PROCEDURE N/A 07/02/2017   Procedure: BENTALL PROCEDURE;  Surgeon: Heriberto London, MD;  Location[facility] OR;  Service: Open Heart  Surgery;  Laterality: N/A;  WITH CIRC ARREST   BENTALL PROCEDURE  07/02/2017   w/ Aortic valve replacement    by Dr. Matt Song   BIOPSY  02/06/2021   Procedure: BIOPSY;  Surgeon: Ozell Blunt, MD;  Location: [facility] ENDOSCOPY;  Service: Endoscopy;;   BUBBLE STUDY  11/01/2021   Procedure: BUBBLE STUDY;  Surgeon: Harrold Lincoln, MD;  Location: [facility] ENDOSCOPY;  Service: Cardiovascular;;   CARDIOVERSION N/A 11/28/2021   Procedure: CARDIOVERSION (CATH LAB);  Surgeon: Boyce Byes, MD;  Location: [facility] INVASIVE CV LAB;  Service: Cardiovascular;  Laterality: N/A;   CARDIOVERSION N/A 07/23/2023   Procedure: CARDIOVERSION;  Surgeon: Lenise Quince, MD;  Location: [facility] INVASIVE CV LAB;  Service: Cardiovascular;  Laterality: N/A;   COLONOSCOPY     COLONOSCOPY WITH PROPOFOL  N/A 02/06/2021   Procedure: COLONOSCOPY WITH PROPOFOL ;  Surgeon: Ozell Blunt, MD;  Location: [facility] ENDOSCOPY;  Service: Endoscopy;  Laterality: N/A;   CORONARY ANGIOPLASTY  06/12/2017   CYSTOSCOPY W/ RETROGRADES Bilateral 06/15/2013   Procedure: CYSTOSCOPY WITH BILATERAL RETROGRADE PYELOGRAM  BLADDER BIOPSY, PROSTATIC URETHRAL BIOPSY, ;  Surgeon: Soledad Dupes, MD;  Location: [facility];  Service: Urology;  Laterality: Bilateral;   ESOPHAGOGASTRODUODENOSCOPY (EGD) WITH PROPOFOL  N/A 03/11/2018   Procedure: ESOPHAGOGASTRODUODENOSCOPY (EGD) WITH PROPOFOL ;  Surgeon: Ozell Blunt, MD;  Location: [facility] ENDOSCOPY;  Service: Endoscopy;  Laterality: N/A;   EXCISION OF MESH  N/A 06/11/2022   Procedure: REMOVAL OF OLD MESH;  Surgeon: Candyce Champagne, MD;  Location: [facility] ORS;  Service: General;  Laterality: N/A;   LAPAROSCOPIC CHOLECYSTECTOMY  01/14/2001   PROCTOSCOPY N/A 06/12/2021   Procedure: RIGID PROCTOSCOPY;  Surgeon: Candyce Champagne, MD;  Location: [facility] ORS;  Service: General;  Laterality: N/A;   RIGHT/LEFT HEART CATH AND CORONARY ANGIOGRAPHY N/A 06/12/2017   Procedure: RIGHT/LEFT HEART CATH AND CORONARY ANGIOGRAPHY;  Surgeon:  Darlis Eisenmenger, MD;  Location: [facility] INVASIVE CV LAB;  Service: Cardiovascular;  Laterality: N/A;   TEE WITHOUT CARDIOVERSION N/A 07/02/2017   Procedure: TRANSESOPHAGEAL ECHOCARDIOGRAM (TEE);  Surgeon: Matt Song, Donata Fryer, MD;  Location: [facility] OR;  Service: Open Heart Surgery;  Laterality: N/A;   TEE WITHOUT CARDIOVERSION N/A 11/01/2021   Procedure: TRANSESOPHAGEAL ECHOCARDIOGRAM (TEE);  Surgeon: Harrold Lincoln, MD;  Location: [facility] ENDOSCOPY;  Service: Cardiovascular;  Laterality: N/A;   UMBILICAL HERNIA REPAIR  01/14/2008   VENTRAL HERNIA REPAIR N/A 06/11/2022   Procedure: LAPAROSCOPIC VENTRAL HERNIA WITH LYSIS OF ADHESIONS;  Surgeon: Candyce Champagne, MD;  Location: [facility] ORS;  Service: General;  Laterality: N/A;  WITH MESH   WISDOM TOOTH EXTRACTION      Family History  Problem Relation Age of Onset   Aortic aneurysm Mother 28       cause of death   Other Father        motor vehicle accident   Heart disease Other        family history   Social History:  reports that he quit smoking about 44 years ago. His smoking use included cigarettes. He started smoking about 55 years ago. He has a 13.5 pack-year smoking history. He has never used smokeless tobacco. He reports current alcohol use of about 10.0 standard drinks of alcohol per week. He reports that he does not use drugs.  Allergies:  Allergies  Allergen Reactions   Ingrezza  [Valbenazine  Tosylate] Other (See Comments)    PVC's   Perphenazine Swelling and Other (See Comments)    Tongue swelling   Amantadines     Feels bad   Amiodarone  Swelling and Other (See Comments)    Tongue swelling   Codeine Itching   Levaquin [Levofloxacin] Other (See Comments)    malaise   Prednisone Itching and Other (See Comments)    CAN TOLERATE DEPO. PREDNISONE BY MOUTH CAUSES MOOD CHANGES, INSOMNIA, AND ITCHING.    Tramadol  Other (See Comments)    Delusions/hallucinations from high doses     Current meds: Aimovig  Autoinjector 140 mg/mL subcutaneous  auto-injector Allegra Allergy  180 mg tablet ALPRAZolam  0.25 mg tablet budesonide -formoterol  HFA 160 mcg-4.5 mcg/actuation aerosol inhaler chlorhexidine  gluconate 4 % topical liquid CitruceL colchicine  0.6 mg tablet finasteride  5 mg tablet hydrocortisone  5 mg tablet ipratropium bromide  42 mcg (0.06 %) nasal spray levothyroxine  112 mcg tablet liothyronine  25 mcg tablet LORazepam  1 mg tablet losartan  50 mg tablet methocarbamoL  750 mg tablet metoprolol  tartrate 50 mg tablet Mucinex  1,200 mg tablet, extended release omeprazole  40 mg capsule,delayed release Percocet 10 mg-325 mg tablet predniSONE 5 mg tablets in a dose pack rizatriptan  10 mg tablet testosterone  cypionate 200 mg/mL intramuscular oil topiramate  25 mg tablet venlafaxine  ER 75 mg capsule,extended release 24 hr Ventolin  HFA 90 mcg/actuation aerosol inhaler Vitamin B12 Xarelto  20 mg tablet zolpidem  10 mg tablet  Review of Systems  Constitutional: Negative.   HENT: Negative.    Eyes: Negative.   Respiratory: Negative.    Cardiovascular: Negative.   Gastrointestinal: Negative.   Endocrine: Negative.  Genitourinary: Negative.   Musculoskeletal:  Positive for back pain, gait problem and myalgias.  Skin: Negative.   Neurological:  Positive for weakness and numbness.  Psychiatric/Behavioral: Negative.      There were no vitals taken for this visit. Physical Exam Constitutional:      Appearance: Normal appearance.  HENT:     Head: Normocephalic and atraumatic.     Right Ear: External ear normal.     Left Ear: External ear normal.     Nose: Nose normal.     Mouth/Throat:     Pharynx: Oropharynx is clear.  Eyes:     Conjunctiva/sclera: Conjunctivae normal.  Cardiovascular:     Rate and Rhythm: Normal rate and regular rhythm.     Pulses: Normal pulses.     Heart sounds: Normal heart sounds.  Pulmonary:     Effort: Pulmonary effort is normal.     Breath sounds: Normal breath sounds.  Abdominal:     General:  Bowel sounds are normal.  Musculoskeletal:     Cervical back: Normal range of motion and neck supple.     Comments: Gait and Station: Appearance: ambulating with no assistive devices and antalgic gait.  Constitutional: General Appearance: healthy-appearing and distress (mild).  Psychiatric: Mood and Affect: active and alert.  Cardiovascular System: Edema Right: none; Dorsalis and posterior tibial pulses 2+. Edema Left: none.  Abdomen: Inspection and Palpation: non-distended and no tenderness.  Skin: Inspection and palpation: no rash.  Lumbar Spine: Inspection: normal alignment. Bony Palpation of the Lumbar Spine: tender at lumbosacral junction.. Bony Palpation of the Right Hip: no tenderness of the greater trochanter and tenderness of the SI joint; Pelvis stable. Bony Palpation of the Left Hip: no tenderness of the greater trochanter and tenderness of the SI joint. Soft Tissue Palpation on the Right: No flank pain with percussion. Active Range of Motion: limited flexion and extention.  Motor Strength: L1 Motor Strength on the Right: hip flexion iliopsoas 5/5. L1 Motor Strength on the Left: hip flexion iliopsoas 3/5. L2-L4 Motor Strength on the Right: knee extension quadriceps 4/5. L2-L4 Motor Strength on the Left: knee extension quadriceps 4/5. L5 Motor Strength on the Right: ankle dorsiflexion tibialis anterior 5/5 and great toe extension extensor hallucis longus 5/5. L5 Motor Strength on the Left: ankle dorsiflexion tibialis anterior 5/5 and great toe extension extensor hallucis longus 5/5. S1 Motor Strength on the Right: plantar flexion gastrocnemius 5/5. S1 Motor Strength on the Left: plantar flexion gastrocnemius 5/5.  Neurological System: Knee Reflex Right: normal (2). Knee Reflex Left: absent (0). Ankle Reflex Right: normal (2). Ankle Reflex Left: normal (2). Babinski Reflex Right: plantar reflex absent. Babinski Reflex Left: plantar reflex absent. Sensation on the Right: normal distal  extremities. Sensation on the Left: normal distal extremities. Special Tests on the Right: no clonus of the ankle/knee. Special Tests on the Left: no clonus of the ankle/knee and seated straight leg raising test positive; His femoral stretch is positive..  Neurological:     Mental Status: He is alert.    X-rays lumbar spine demonstrates a mild levorotatory scoliosis. Disc degeneration L4-5 grade 1 spinal listhesis L3-4  MRI demonstrates a large herniated disc from L3-4 migrating cephalad to the left behind the L3 vertebral body. Of L3 is compressing the L3 nerve root  Assessment/Plan Impression:  1. L3-L4 radiculopathy left with myotome weakness dermatomal dysesthesias due to large extruded fragment L3-4 migrating cephalad to the left 2. On Xarelto  for A-fib 3. 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 He is not doing well in terms of the oxycodone . He has difficulty with his chronic fatigue when he is taking an opioid. He is on Xarelto  he is not a candidate for an epidural potentially a selective nerve root block.  Patient does have weakness in his hip flexors and his quad on the left and diminished sensation in the L3 dermatome. That has progressed.  We discussed therefore surgery and patient wants to proceed with that instead of conservative treatment I had an extensive discussion with the patient concerning the pathology relevant anatomy and treatment options. At this point exhausting conservative treatment and in the presence of a neurologic deficit we discussed microlumbar decompression. I discussed the risks and benefits including bleeding, infection, DVT, PE, anesthetic complications, worsening in their symptoms, improvement in their symptoms, C SF leakage, epidural fibrosis, need for future surgeries such as revision discectomy and lumbar fusion. I also indicated that this is an  operation to basically decompress the nerve roots to allow recovery as opposed to fixing a herniated disc if it is encountered and that the incidence of recurrent chest disc herniation can approach 15%. Also that nerve root recovery is variable and may not recover completely. Any ligament or bone that is contributing to compressing the nerves will be removed as well.  I discussed the operative course including overnight in the hospital. Immediate ambulation. Follow-up in 2 weeks for suture removal. 6 weeks until healing of the herniation and surgical incision followed by 6 weeks of reconditioning and strengthening of the core musculature. Also discussed the need to employ the concepts of disc pressure management and core motion following the surgery to minimize the risk of recurrent disc herniation. We will obtain preoperative clearance i if necessary and proceed accordingly.  Preoperative clearance from his cardiologist as well as primary care physician.  In the interim we proceeded with a Toradol  injection 15 mg IM  A steroid Dosepak 5 mg 6-day A prescription for a steroid dose pack was given to reduce inflammation. 5 mg tablets to be taken over a 6 a day period of time in decreasing doses. The packaging provides guidance. In addition, a refill was given to be taken if persistent or recurrent pain is present. Maximum number of Sterapred dose packs in a year's period of time is approximately three. Any anti-inflammatory medication should be discontinued while using this dose pack but can be resumed following its completion. Side effects may include fluid retention, insomnia, and hyperactivity. If you are a diabetic monitor your blood glucose levels as steroids typically elevate those. You may need to supplement your current diabetes medication regiment accordingly. It is advised to consult with your medical physician for this.  No history of DVT or MRSA.  Call if there are any changes in his  symptoms.  He may do better using a rolling walker or walking on the ball of his left foot to decrease the femoral stretch.  Plan: Central laminectomy, microdiscectomy L3-4  Barba Levin, PA-C for Dr Leighton Punches 03/23/2024, 11:22 AM

## 2024-03-23 NOTE — H&P (Signed)
 Grant [name] is an 73 y.o. male.   Chief Complaint: back and leg pain HPI: Location: low back Severity: pain level 6/10 Duration: 5 days Timing: acute Context: bending (to put on his shoe) Alleviating Factors: position change Aggravating Factors: sitting Associated Symptoms: tingling; radiation down leg Prior Imaging: MRI Previous Injections: 03/04/24 toradol  IM injection 03/04/24 trigger point injections Notes: Patient was seen by Dr. Dante Dyer on 03/04/24 and was given Toradol  IM and a trigger point injection. Patient was prescribed Percocet Referred by Dr. Dante Dyer. No fevers chills change in bowel or bladder function he was referred for surgical consultation.  He reports increasing numbness and weakness on the left. Severe pain he is taken Percocet without much avail. He also has Robaxin . He has taken lorazepam . It radiates down the left leg there is numbness around his knee weakness when going up and down stairs  Past Medical History:  Diagnosis Date   Allergic rhinitis    Anxiety    Arthritis    Asthma    Bladder tumor    Chronic fatigue    COVID-19 05/22/2021   Depression    06/30/17 Pt denies being depressed, reports Effexor  is taken for Chronic Fatigue    Dyspnea    Enlarged prostate    Fibromyalgia    GERD (gastroesophageal reflux disease)    History of chronic bronchitis    History of migraine    light and sound sensitive   History of toxic encephalopathy    Hypothyroidism    OSA on CPAP    CPAP 14   PAF (paroxysmal atrial fibrillation) (HCC) CARDIOLOGIST -- DR Stann Earnest   DX OCT 2013   Pneumonia 2023   S/P AVR (aortic valve replacement) and aortoplasty    Stroke (HCC)    has post stroke chorea   Unspecified essential hypertension    Urethral tumor    PROSTATIC    Past Surgical History:  Procedure Laterality Date   BENTALL PROCEDURE N/A 07/02/2017   Procedure: BENTALL PROCEDURE;  Surgeon: Heriberto London, MD;  Location: [facility] OR;  Service: Open Heart  Surgery;  Laterality: N/A;  WITH CIRC ARREST   BENTALL PROCEDURE  07/02/2017   w/ Aortic valve replacement    by Dr. Matt Song   BIOPSY  02/06/2021   Procedure: BIOPSY;  Surgeon: Ozell Blunt, MD;  Location: [facility] ENDOSCOPY;  Service: Endoscopy;;   BUBBLE STUDY  11/01/2021   Procedure: BUBBLE STUDY;  Surgeon: Harrold Lincoln, MD;  Location: [facility] ENDOSCOPY;  Service: Cardiovascular;;   CARDIOVERSION N/A 11/28/2021   Procedure: CARDIOVERSION (CATH LAB);  Surgeon: Boyce Byes, MD;  Location: [facility] INVASIVE CV LAB;  Service: Cardiovascular;  Laterality: N/A;   CARDIOVERSION N/A 07/23/2023   Procedure: CARDIOVERSION;  Surgeon: Lenise Quince, MD;  Location: [facility] INVASIVE CV LAB;  Service: Cardiovascular;  Laterality: N/A;   COLONOSCOPY     COLONOSCOPY WITH PROPOFOL  N/A 02/06/2021   Procedure: COLONOSCOPY WITH PROPOFOL ;  Surgeon: Ozell Blunt, MD;  Location: [facility] ENDOSCOPY;  Service: Endoscopy;  Laterality: N/A;   CORONARY ANGIOPLASTY  06/12/2017   CYSTOSCOPY W/ RETROGRADES Bilateral 06/15/2013   Procedure: CYSTOSCOPY WITH BILATERAL RETROGRADE PYELOGRAM  BLADDER BIOPSY, PROSTATIC URETHRAL BIOPSY, ;  Surgeon: Soledad Dupes, MD;  Location: [facility];  Service: Urology;  Laterality: Bilateral;   ESOPHAGOGASTRODUODENOSCOPY (EGD) WITH PROPOFOL  N/A 03/11/2018   Procedure: ESOPHAGOGASTRODUODENOSCOPY (EGD) WITH PROPOFOL ;  Surgeon: Ozell Blunt, MD;  Location: [facility] ENDOSCOPY;  Service: Endoscopy;  Laterality: N/A;   EXCISION OF MESH  N/A 06/11/2022   Procedure: REMOVAL OF OLD MESH;  Surgeon: Candyce Champagne, MD;  Location: [facility] ORS;  Service: General;  Laterality: N/A;   LAPAROSCOPIC CHOLECYSTECTOMY  01/14/2001   PROCTOSCOPY N/A 06/12/2021   Procedure: RIGID PROCTOSCOPY;  Surgeon: Candyce Champagne, MD;  Location: [facility] ORS;  Service: General;  Laterality: N/A;   RIGHT/LEFT HEART CATH AND CORONARY ANGIOGRAPHY N/A 06/12/2017   Procedure: RIGHT/LEFT HEART CATH AND CORONARY ANGIOGRAPHY;  Surgeon:  Darlis Eisenmenger, MD;  Location: [facility] INVASIVE CV LAB;  Service: Cardiovascular;  Laterality: N/A;   TEE WITHOUT CARDIOVERSION N/A 07/02/2017   Procedure: TRANSESOPHAGEAL ECHOCARDIOGRAM (TEE);  Surgeon: Matt Song, Donata Fryer, MD;  Location: [facility] OR;  Service: Open Heart Surgery;  Laterality: N/A;   TEE WITHOUT CARDIOVERSION N/A 11/01/2021   Procedure: TRANSESOPHAGEAL ECHOCARDIOGRAM (TEE);  Surgeon: Harrold Lincoln, MD;  Location: [facility] ENDOSCOPY;  Service: Cardiovascular;  Laterality: N/A;   UMBILICAL HERNIA REPAIR  01/14/2008   VENTRAL HERNIA REPAIR N/A 06/11/2022   Procedure: LAPAROSCOPIC VENTRAL HERNIA WITH LYSIS OF ADHESIONS;  Surgeon: Candyce Champagne, MD;  Location: [facility] ORS;  Service: General;  Laterality: N/A;  WITH MESH   WISDOM TOOTH EXTRACTION      Family History  Problem Relation Age of Onset   Aortic aneurysm Mother 28       cause of death   Other Father        motor vehicle accident   Heart disease Other        family history   Social History:  reports that he quit smoking about 44 years ago. His smoking use included cigarettes. He started smoking about 55 years ago. He has a 13.5 pack-year smoking history. He has never used smokeless tobacco. He reports current alcohol use of about 10.0 standard drinks of alcohol per week. He reports that he does not use drugs.  Allergies:  Allergies  Allergen Reactions   Ingrezza  [Valbenazine  Tosylate] Other (See Comments)    PVC's   Perphenazine Swelling and Other (See Comments)    Tongue swelling   Amantadines     Feels bad   Amiodarone  Swelling and Other (See Comments)    Tongue swelling   Codeine Itching   Levaquin [Levofloxacin] Other (See Comments)    malaise   Prednisone Itching and Other (See Comments)    CAN TOLERATE DEPO. PREDNISONE BY MOUTH CAUSES MOOD CHANGES, INSOMNIA, AND ITCHING.    Tramadol  Other (See Comments)    Delusions/hallucinations from high doses     Current meds: Aimovig  Autoinjector 140 mg/mL subcutaneous  auto-injector Allegra Allergy  180 mg tablet ALPRAZolam  0.25 mg tablet budesonide -formoterol  HFA 160 mcg-4.5 mcg/actuation aerosol inhaler chlorhexidine  gluconate 4 % topical liquid CitruceL colchicine  0.6 mg tablet finasteride  5 mg tablet hydrocortisone  5 mg tablet ipratropium bromide  42 mcg (0.06 %) nasal spray levothyroxine  112 mcg tablet liothyronine  25 mcg tablet LORazepam  1 mg tablet losartan  50 mg tablet methocarbamoL  750 mg tablet metoprolol  tartrate 50 mg tablet Mucinex  1,200 mg tablet, extended release omeprazole  40 mg capsule,delayed release Percocet 10 mg-325 mg tablet predniSONE 5 mg tablets in a dose pack rizatriptan  10 mg tablet testosterone  cypionate 200 mg/mL intramuscular oil topiramate  25 mg tablet venlafaxine  ER 75 mg capsule,extended release 24 hr Ventolin  HFA 90 mcg/actuation aerosol inhaler Vitamin B12 Xarelto  20 mg tablet zolpidem  10 mg tablet  Review of Systems  Constitutional: Negative.   HENT: Negative.    Eyes: Negative.   Respiratory: Negative.    Cardiovascular: Negative.   Gastrointestinal: Negative.   Endocrine: Negative.  Genitourinary: Negative.   Musculoskeletal:  Positive for back pain, gait problem and myalgias.  Skin: Negative.   Neurological:  Positive for weakness and numbness.  Psychiatric/Behavioral: Negative.      There were no vitals taken for this visit. Physical Exam Constitutional:      Appearance: Normal appearance.  HENT:     Head: Normocephalic and atraumatic.     Right Ear: External ear normal.     Left Ear: External ear normal.     Nose: Nose normal.     Mouth/Throat:     Pharynx: Oropharynx is clear.  Eyes:     Conjunctiva/sclera: Conjunctivae normal.  Cardiovascular:     Rate and Rhythm: Normal rate and regular rhythm.     Pulses: Normal pulses.     Heart sounds: Normal heart sounds.  Pulmonary:     Effort: Pulmonary effort is normal.     Breath sounds: Normal breath sounds.  Abdominal:     General:  Bowel sounds are normal.  Musculoskeletal:     Cervical back: Normal range of motion and neck supple.     Comments: Gait and Station: Appearance: ambulating with no assistive devices and antalgic gait.  Constitutional: General Appearance: healthy-appearing and distress (mild).  Psychiatric: Mood and Affect: active and alert.  Cardiovascular System: Edema Right: none; Dorsalis and posterior tibial pulses 2+. Edema Left: none.  Abdomen: Inspection and Palpation: non-distended and no tenderness.  Skin: Inspection and palpation: no rash.  Lumbar Spine: Inspection: normal alignment. Bony Palpation of the Lumbar Spine: tender at lumbosacral junction.. Bony Palpation of the Right Hip: no tenderness of the greater trochanter and tenderness of the SI joint; Pelvis stable. Bony Palpation of the Left Hip: no tenderness of the greater trochanter and tenderness of the SI joint. Soft Tissue Palpation on the Right: No flank pain with percussion. Active Range of Motion: limited flexion and extention.  Motor Strength: L1 Motor Strength on the Right: hip flexion iliopsoas 5/5. L1 Motor Strength on the Left: hip flexion iliopsoas 3/5. L2-L4 Motor Strength on the Right: knee extension quadriceps 4/5. L2-L4 Motor Strength on the Left: knee extension quadriceps 4/5. L5 Motor Strength on the Right: ankle dorsiflexion tibialis anterior 5/5 and great toe extension extensor hallucis longus 5/5. L5 Motor Strength on the Left: ankle dorsiflexion tibialis anterior 5/5 and great toe extension extensor hallucis longus 5/5. S1 Motor Strength on the Right: plantar flexion gastrocnemius 5/5. S1 Motor Strength on the Left: plantar flexion gastrocnemius 5/5.  Neurological System: Knee Reflex Right: normal (2). Knee Reflex Left: absent (0). Ankle Reflex Right: normal (2). Ankle Reflex Left: normal (2). Babinski Reflex Right: plantar reflex absent. Babinski Reflex Left: plantar reflex absent. Sensation on the Right: normal distal  extremities. Sensation on the Left: normal distal extremities. Special Tests on the Right: no clonus of the ankle/knee. Special Tests on the Left: no clonus of the ankle/knee and seated straight leg raising test positive; His femoral stretch is positive..  Neurological:     Mental Status: He is alert.    X-rays lumbar spine demonstrates a mild levorotatory scoliosis. Disc degeneration L4-5 grade 1 spinal listhesis L3-4  MRI demonstrates a large herniated disc from L3-4 migrating cephalad to the left behind the L3 vertebral body. Of L3 is compressing the L3 nerve root  Assessment/Plan Impression:  1. L3-L4 radiculopathy left with myotome weakness dermatomal dysesthesias due to large extruded fragment L3-4 migrating cephalad to the left 2. On Xarelto  for A-fib 3. 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 He is not doing well in terms of the oxycodone . He has difficulty with his chronic fatigue when he is taking an opioid. He is on Xarelto  he is not a candidate for an epidural potentially a selective nerve root block.  Patient does have weakness in his hip flexors and his quad on the left and diminished sensation in the L3 dermatome. That has progressed.  We discussed therefore surgery and patient wants to proceed with that instead of conservative treatment I had an extensive discussion with the patient concerning the pathology relevant anatomy and treatment options. At this point exhausting conservative treatment and in the presence of a neurologic deficit we discussed microlumbar decompression. I discussed the risks and benefits including bleeding, infection, DVT, PE, anesthetic complications, worsening in their symptoms, improvement in their symptoms, C SF leakage, epidural fibrosis, need for future surgeries such as revision discectomy and lumbar fusion. I also indicated that this is an  operation to basically decompress the nerve roots to allow recovery as opposed to fixing a herniated disc if it is encountered and that the incidence of recurrent chest disc herniation can approach 15%. Also that nerve root recovery is variable and may not recover completely. Any ligament or bone that is contributing to compressing the nerves will be removed as well.  I discussed the operative course including overnight in the hospital. Immediate ambulation. Follow-up in 2 weeks for suture removal. 6 weeks until healing of the herniation and surgical incision followed by 6 weeks of reconditioning and strengthening of the core musculature. Also discussed the need to employ the concepts of disc pressure management and core motion following the surgery to minimize the risk of recurrent disc herniation. We will obtain preoperative clearance i if necessary and proceed accordingly.  Preoperative clearance from his cardiologist as well as primary care physician.  In the interim we proceeded with a Toradol  injection 15 mg IM  A steroid Dosepak 5 mg 6-day A prescription for a steroid dose pack was given to reduce inflammation. 5 mg tablets to be taken over a 6 a day period of time in decreasing doses. The packaging provides guidance. In addition, a refill was given to be taken if persistent or recurrent pain is present. Maximum number of Sterapred dose packs in a year's period of time is approximately three. Any anti-inflammatory medication should be discontinued while using this dose pack but can be resumed following its completion. Side effects may include fluid retention, insomnia, and hyperactivity. If you are a diabetic monitor your blood glucose levels as steroids typically elevate those. You may need to supplement your current diabetes medication regiment accordingly. It is advised to consult with your medical physician for this.  No history of DVT or MRSA.  Call if there are any changes in his  symptoms.  He may do better using a rolling walker or walking on the ball of his left foot to decrease the femoral stretch.  Plan: Central laminectomy, microdiscectomy L3-4  Barba Levin, PA-C for Dr Leighton Punches 03/23/2024, 11:22 AM

## 2024-03-24 ENCOUNTER — Encounter (HOSPITAL_COMMUNITY): Payer: Self-pay

## 2024-03-24 NOTE — Pre-Procedure Instructions (Signed)
 Surgical Instructions   Your procedure is scheduled on March 31, 2024. Report to [facility] Main Entrance "A" at 9:30 A.M., then check in with the Admitting office. Any questions or running late day of surgery: call [phone]  Questions prior to your surgery date: call [phone], Monday-Friday, 8am-4pm. If you experience any cold or flu symptoms such as cough, fever, chills, shortness of breath, etc. between now and your scheduled surgery, please notify us  at the above number.     Remember:  Do not eat after midnight the night before your surgery  You may drink clear liquids until 8:30 AM the morning of your surgery.   Clear liquids allowed are: Water , Non-Citrus Juices (without pulp), Carbonated Beverages, Clear Tea (no milk, honey, etc.), Black Coffee Only (NO MILK, CREAM OR POWDERED CREAMER of any kind), and Gatorade.  Patient Instructions  The night before surgery:  No food after midnight. ONLY clear liquids after midnight  The day of surgery (if you do NOT have diabetes):  Drink ONE (1) Pre-Surgery Clear Ensure by 8:30 AM the morning of surgery. Drink in one sitting. Do not sip.  This drink was given to you during your hospital  pre-op appointment visit.  Nothing else to drink after completing the  Pre-Surgery Clear Ensure.         If you have questions, please contact your surgeon's office.    Take these medicines the morning of surgery with A SIP OF WATER : budesonide -formoterol  (SYMBICORT ) inhaler  cetirizine (ZYRTEC)  colchicine  (COLCRYS )  fexofenadine (ALLEGRA)  hydrocortisone  (CORTEF )  ipratropium (ATROVENT ) nasal spray  levothyroxine  (SYNTHROID , LEVOTHROID)  liothyronine  (CYTOMEL )  methocarbamol  (ROBAXIN )  metoprolol  tartrate (LOPRESSOR )  omeprazole  (PRILOSEC)  rOPINIRole  (REQUIP )  sodium chloride  (OCEAN) nasal spray  venlafaxine  XR (EFFEXOR -XR)    May take these medicines IF NEEDED: acetaminophen  (TYLENOL )  albuterol  (VENTOLIN  HFA) inhaler - please  bring inhaler with you morning of surgery ALPRAZolam  (XANAX )  azelastine  (ASTELIN ) nasal spray  diphenhydrAMINE  (BENADRYL )  olopatadine  (PATANOL) ophthalmic solution  oxyCODONE -acetaminophen  (PERCOCET)  rizatriptan  (MAXALT )    One week prior to surgery, STOP taking any Aspirin  (unless otherwise instructed by your surgeon) Aleve, Naproxen, Ibuprofen , Motrin , Advil , Goody's, BC's, all herbal medications, fish oil, and non-prescription vitamins. This includes your medication: diclofenac  Sodium (VOLTAREN ) GEL                      Do NOT Smoke (Tobacco/Vaping) for 24 hours prior to your procedure.  If you use a CPAP at night, you may bring your mask/headgear for your overnight stay.   You will be asked to remove any contacts, glasses, piercing's, hearing aid's, dentures/partials prior to surgery. Please bring cases for these items if needed.    Patients discharged the day of surgery will not be allowed to drive home, and someone needs to stay with them for 24 hours.  SURGICAL WAITING ROOM VISITATION Patients may have no more than 2 support people in the waiting area - these visitors may rotate.   Pre-op nurse will coordinate an appropriate time for 1 ADULT support person, who may not rotate, to accompany patient in pre-op.  Children under the age of 64 must have an adult with them who is not the patient and must remain in the main waiting area with an adult.  If the patient needs to stay at the hospital during part of their recovery, the visitor guidelines for inpatient rooms apply.  Please refer to the [facility] website for the visitor guidelines for  any additional information.   If you received a COVID test during your pre-op visit  it is requested that you wear a mask when out in public, stay away from anyone that may not be feeling well and notify your surgeon if you develop symptoms. If you have been in contact with anyone that has tested positive in the last 10 days please notify you  surgeon.      Pre-operative 5 CHG Bathing Instructions   You can play a key role in reducing the risk of infection after surgery. Your skin needs to be as free of germs as possible. You can reduce the number of germs on your skin by washing with CHG (chlorhexidine  gluconate) soap before surgery. CHG is an antiseptic soap that kills germs and continues to kill germs even after washing.   DO NOT use if you have an allergy  to chlorhexidine /CHG or antibacterial soaps. If your skin becomes reddened or irritated, stop using the CHG and notify one of our RNs at [phone].   Please shower with the CHG soap starting 4 days before surgery using the following schedule:     Please keep in mind the following:  DO NOT shave, including legs and underarms, starting the day of your first shower.   You may shave your face at any point before/day of surgery.  Place clean sheets on your bed the day you start using CHG soap. Use a clean washcloth (not used since being washed) for each shower. DO NOT sleep with pets once you start using the CHG.   CHG Shower Instructions:  Wash your face and private area with normal soap. If you choose to wash your hair, wash first with your normal shampoo.  After you use shampoo/soap, rinse your hair and body thoroughly to remove shampoo/soap residue.  Turn the water  OFF and apply about 3 tablespoons (45 ml) of CHG soap to a CLEAN washcloth.  Apply CHG soap ONLY FROM YOUR NECK DOWN TO YOUR TOES (washing for 3-5 minutes)  DO NOT use CHG soap on face, private areas, open wounds, or sores.  Pay special attention to the area where your surgery is being performed.  If you are having back surgery, having someone wash your back for you may be helpful. Wait 2 minutes after CHG soap is applied, then you may rinse off the CHG soap.  Pat dry with a clean towel  Put on clean clothes/pajamas   If you choose to wear lotion, please use ONLY the CHG-compatible lotions that are listed  below.  Additional instructions for the day of surgery: DO NOT APPLY any lotions, deodorants, cologne, or perfumes.   Do not bring valuables to the hospital. [facility] is not responsible for any belongings/valuables. Do not wear nail polish, gel polish, artificial nails, or any other type of covering on natural nails (fingers and toes) Do not wear jewelry or makeup Put on clean/comfortable clothes.  Please brush your teeth.  Ask your nurse before applying any prescription medications to the skin.     CHG Compatible Lotions   Aveeno Moisturizing lotion  Cetaphil Moisturizing Cream  Cetaphil Moisturizing Lotion  Clairol Herbal Essence Moisturizing Lotion, Dry Skin  Clairol Herbal Essence Moisturizing Lotion, Extra Dry Skin  Clairol Herbal Essence Moisturizing Lotion, Normal Skin  Curel Age Defying Therapeutic Moisturizing Lotion with Alpha Hydroxy  Curel Extreme Care Body Lotion  Curel Soothing Hands Moisturizing Hand Lotion  Curel Therapeutic Moisturizing Cream, Fragrance-Free  Curel Therapeutic Moisturizing Lotion, Fragrance-Free  Curel Therapeutic  Moisturizing Lotion, Original Formula  Eucerin Daily Replenishing Lotion  Eucerin Dry Skin Therapy Plus Alpha Hydroxy Crme  Eucerin Dry Skin Therapy Plus Alpha Hydroxy Lotion  Eucerin Original Crme  Eucerin Original Lotion  Eucerin Plus Crme Eucerin Plus Lotion  Eucerin TriLipid Replenishing Lotion  Keri Anti-Bacterial Hand Lotion  Keri Deep Conditioning Original Lotion Dry Skin Formula Softly Scented  Keri Deep Conditioning Original Lotion, Fragrance Free Sensitive Skin Formula  Keri Lotion Fast Absorbing Fragrance Free Sensitive Skin Formula  Keri Lotion Fast Absorbing Softly Scented Dry Skin Formula  Keri Original Lotion  Keri Skin Renewal Lotion Keri Silky Smooth Lotion  Keri Silky Smooth Sensitive Skin Lotion  Nivea Body Creamy Conditioning Oil  Nivea Body Extra Enriched Lotion  Nivea Body Original Lotion  Nivea Body  Sheer Moisturizing Lotion Nivea Crme  Nivea Skin Firming Lotion  NutraDerm 30 Skin Lotion  NutraDerm Skin Lotion  NutraDerm Therapeutic Skin Cream  NutraDerm Therapeutic Skin Lotion  ProShield Protective Hand Cream  Provon moisturizing lotion  Please read over the following fact sheets that you were given.

## 2024-03-25 ENCOUNTER — Other Ambulatory Visit: Payer: Self-pay

## 2024-03-25 ENCOUNTER — Encounter (HOSPITAL_COMMUNITY)
Admission: RE | Admit: 2024-03-25 | Discharge: 2024-03-25 | Disposition: A | Discharge status: Home / Self Care | Source: Ambulatory Visit | Attending: Specialist | Admitting: Specialist

## 2024-03-25 ENCOUNTER — Encounter (HOSPITAL_COMMUNITY): Payer: Self-pay

## 2024-03-25 ENCOUNTER — Ambulatory Visit (HOSPITAL_COMMUNITY)
Admission: RE | Admit: 2024-03-25 | Discharge: 2024-03-25 | Disposition: A | Discharge status: Home / Self Care | Source: Ambulatory Visit | Attending: Orthopedic Surgery | Admitting: Orthopedic Surgery

## 2024-03-25 VITALS — BP 157/87 | HR 56 | Temp 97.7°F | Resp 18 | Ht 70.0 in | Wt 232.8 lb

## 2024-03-25 DIAGNOSIS — I119 Hypertensive heart disease without heart failure: Secondary | ICD-10-CM | POA: Insufficient documentation

## 2024-03-25 DIAGNOSIS — I444 Left anterior fascicular block: Secondary | ICD-10-CM | POA: Diagnosis not present

## 2024-03-25 DIAGNOSIS — I480 Paroxysmal atrial fibrillation: Secondary | ICD-10-CM | POA: Diagnosis not present

## 2024-03-25 DIAGNOSIS — Z01812 Encounter for preprocedural laboratory examination: Secondary | ICD-10-CM | POA: Insufficient documentation

## 2024-03-25 DIAGNOSIS — G4733 Obstructive sleep apnea (adult) (pediatric): Secondary | ICD-10-CM | POA: Diagnosis not present

## 2024-03-25 DIAGNOSIS — M48061 Spinal stenosis, lumbar region without neurogenic claudication: Secondary | ICD-10-CM | POA: Diagnosis not present

## 2024-03-25 DIAGNOSIS — Z7901 Long term (current) use of anticoagulants: Secondary | ICD-10-CM | POA: Diagnosis not present

## 2024-03-25 DIAGNOSIS — I2721 Secondary pulmonary arterial hypertension: Secondary | ICD-10-CM | POA: Diagnosis not present

## 2024-03-25 DIAGNOSIS — M4316 Spondylolisthesis, lumbar region: Secondary | ICD-10-CM | POA: Diagnosis not present

## 2024-03-25 DIAGNOSIS — I2510 Atherosclerotic heart disease of native coronary artery without angina pectoris: Secondary | ICD-10-CM | POA: Insufficient documentation

## 2024-03-25 DIAGNOSIS — M5126 Other intervertebral disc displacement, lumbar region: Secondary | ICD-10-CM

## 2024-03-25 DIAGNOSIS — M51369 Other intervertebral disc degeneration, lumbar region without mention of lumbar back pain or lower extremity pain: Secondary | ICD-10-CM | POA: Diagnosis not present

## 2024-03-25 DIAGNOSIS — Z952 Presence of prosthetic heart valve: Secondary | ICD-10-CM | POA: Diagnosis not present

## 2024-03-25 DIAGNOSIS — Z01818 Encounter for other preprocedural examination: Secondary | ICD-10-CM

## 2024-03-25 HISTORY — DX: Cardiac arrhythmia, unspecified: I49.9

## 2024-03-25 HISTORY — DX: Other complications of anesthesia, initial encounter: T88.59XA

## 2024-03-25 LAB — CBC
HCT: 46.6 % (ref 39.0–52.0)
Hemoglobin: 15.4 g/dL (ref 13.0–17.0)
MCH: 32 pg (ref 26.0–34.0)
MCHC: 33 g/dL (ref 30.0–36.0)
MCV: 96.7 fL (ref 80.0–100.0)
Platelets: 157 10*3/uL (ref 150–400)
RBC: 4.82 MIL/uL (ref 4.22–5.81)
RDW: 13.9 % (ref 11.5–15.5)
WBC: 12.7 10*3/uL — ABNORMAL HIGH (ref 4.0–10.5)
nRBC: 0 % (ref 0.0–0.2)

## 2024-03-25 LAB — BASIC METABOLIC PANEL WITH GFR
Anion gap: 7 (ref 5–15)
BUN: 16 mg/dL (ref 8–23)
CO2: 29 mmol/L (ref 22–32)
Calcium: 9.1 mg/dL (ref 8.9–10.3)
Chloride: 102 mmol/L (ref 98–111)
Creatinine, Ser: 1.1 mg/dL (ref 0.61–1.24)
GFR, Estimated: >60 mL/min (ref >60)
Glucose, Bld: 101 mg/dL — ABNORMAL HIGH (ref 70–99)
Potassium: 4.1 mmol/L (ref 3.5–5.1)
Sodium: 138 mmol/L (ref 135–145)

## 2024-03-25 LAB — SURGICAL PCR SCREEN
MRSA, PCR: NEGATIVE
Staphylococcus aureus: NEGATIVE

## 2024-03-25 NOTE — Progress Notes (Signed)
 PCP - Dr. Elyn Han Cardiologist - Dr. Janelle Mediate, LOV, 03/21/2024  PPM/ICD - denies Device Orders - na Rep Notified - na  Chest x-ray - 08/14/2023 EKG - 03/21/2024 Stress Test - 08/31/2015 ECHO - 03/05/2023 Cardiac Cath - 06/12/2017  Sleep Study - OSA with CPAP nightly  No-diabetic  Blood Thinner Instructions:xarelto , hold 3 days prior to surgery, last dose 03/27/2024 Aspirin  Instructions:denies  ERAS Protcol -Ensure until 0830  Anesthesia review: cardiac clearance, HTN, CVA, OSA with CPAP, A-Fib, on xarelto   Patient denies shortness of breath, fever, cough and chest pain at PAT appointment   All instructions explained to the patient, with a verbal understanding of the material. Patient agrees to go over the instructions while at home for a better understanding. Patient also instructed to self quarantine after being tested for COVID-19. The opportunity to ask questions was provided.

## 2024-03-26 DIAGNOSIS — G4733 Obstructive sleep apnea (adult) (pediatric): Secondary | ICD-10-CM | POA: Diagnosis not present

## 2024-03-28 NOTE — Anesthesia Preprocedure Evaluation (Signed)
 Anesthesia Evaluation  Patient identified by MRN, date of birth, ID band Patient awake    Reviewed: Allergy  & Precautions, NPO status , Patient's Chart, lab work & pertinent test results  Airway Mallampati: II  TM Distance: >3 FB Neck ROM: Full    Dental no notable dental hx. (+) Teeth Intact, Dental Advisory Given   Pulmonary sleep apnea and Continuous Positive Airway Pressure Ventilation , former smoker   Pulmonary exam normal breath sounds clear to auscultation       Cardiovascular hypertension, Pt. on medications Normal cardiovascular exam+ dysrhythmias Atrial Fibrillation + Valvular Problems/Murmurs AI  Rhythm:Regular Rate:Normal  Bioprostetheic Aortic valve  02/2023 TTE 1. Left ventricular ejection fraction, by estimation, is 55 to 60%. The  left ventricle has normal function. The left ventricle has no regional  wall motion abnormalities. There is mild left ventricular hypertrophy.  Left ventricular diastolic parameters  were normal.   2. Right ventricular systolic function is normal. The right ventricular  size is normal. There is moderately elevated pulmonary artery systolic  pressure.   3. Left atrial size was moderately dilated.   4. The mitral valve is abnormal. Trivial mitral valve regurgitation. No  evidence of mitral stenosis.   5. 25 mm Edwards bioprosthetic valve mild calcification of leaflets  gradients stable since prior TTE cone 02/28/22 . The aortic valve has been  repaired/replaced. Aortic valve regurgitation is not visualized. No aortic  stenosis is present. There is a 25  mm Edwards bioprosthetic valve present in the aortic position. Procedure  Date: 10/28/21.   6. Aortic dilatation noted. There is mild dilatation of the aortic root,  measuring 38 mm.   7. The inferior vena cava is normal in size with greater than 50%  respiratory variability, suggesting right atrial pressure of 3 mmHg.       Neuro/Psych  Headaches    GI/Hepatic ,GERD  Medicated and Controlled,,  Endo/Other  Hypothyroidism    Renal/GU Renal diseaseLab Results      Component                Value               Date                        K                        4.1                 03/25/2024                CO2                      29                  03/25/2024                BUN                      16                  03/25/2024                CREATININE               1.10                03/25/2024  Musculoskeletal  (+) Arthritis ,  Fibromyalgia -  Abdominal   Peds  Hematology Lab Results      Component                Value               Date                      WBC                      12.7 (H)            03/25/2024                HGB                      15.4                03/25/2024                HCT                      46.6                03/25/2024                MCV                      96.7                03/25/2024                PLT                      157                 03/25/2024              Anesthesia Other Findings All: See list  Reproductive/Obstetrics                             Anesthesia Physical Anesthesia Plan  ASA: 3  Anesthesia Plan: General   Post-op Pain Management: Ketamine  IV*, Dilaudid  IV and Tylenol  PO (pre-op)*   Induction: Intravenous  PONV Risk Score and Plan: 4 or greater and Treatment may vary due to age or medical condition, Midazolam , Dexamethasone  and Ondansetron   Airway Management Planned: Oral ETT  Additional Equipment: None  Intra-op Plan:   Post-operative Plan: Extubation in OR  Informed Consent:      Dental advisory given  Plan Discussed with:   Anesthesia Plan Comments: (PAT note by Rudy Costain, PA-C: 73 year old male follows with cardiology for history of PAF on Xarelto , HTN, OSA on CPAP, h/o bicuspid aortic valve with aortic insufficiency and dilated aortic root s/p Bentall  procedure with tissue valve 2018, no CAD at cath done 06/12/17 prior to surgery.  Echo 02/2023 showed EF 55 to 60%, normal RV systolic function, moderately elevated PASP, stable bioprosthetic aortic valve without significant regurgitation or stenosis.  Seen by Herbert Loh, PA-C on 03/21/2024 for preop eval.  Per note, preop clearance for Lumbar decompression by Dr. Orvan Blanch- patient is on Xarelto  and has been cleared by our pharmacy team to hold Xarelto  3 days prior to surgery. Patients METs>5 and he's a reasonable risk to proceed with above surgery. He is at risk  of recurrent Afib with his prior history. According to the Revised Cardiac Risk Index (RCRI), his Perioperative Risk of Major Cardiac Event is (%): 6.6. His Functional Capacity in METs is: 5.62 according to the [employer] Activity Status Index (DASI).  Patient reports last dose Xarelto  03/27/2024.  Other pertinent history includes hypothyroidism, migraines, chronic cough (possible chronic tic disorder per pulmonology notes), CVA with poststroke chorea, GERD on PPI.  Preop labs reviewed, unremarkable.  EKG 03/21/24: Normal sinus rhythm.  Rate 92. Left anterior fascicular block. Left ventricular hypertrophy with QRS widening and repolarization abnormality  CT chest 10/30/2023: IMPRESSION: 1. Interval resolution of the previously noted part solid nodule on prior examination of 06/01/2023 in keeping with infectious or inflammatory process on prior examination. No suspicious or indeterminate pulmonary nodules are identified. 2. Status post aortic valve replacement and ascending aortic tube graft repair. 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 Recommend annual imaging followup by CTA or MRA. This recommendation follows 2010 ACCF/AHA/AATS/ACR/ASA/SCA/SCAI/SIR/STS/SVM Guidelines for the Diagnosis and Management of Patients with Thoracic Aortic Disease. Circulation.2010; 121: U272-Z366. Aortic aneurysm  NOS (ICD10-I71.9) 3. Minimal coronary artery calcification. 4. Morphologic changes in keeping with pulmonary arterial hypertension.  TTE 03/05/2023: 1. Left ventricular ejection fraction, by estimation, is 55 to 60%. The  left ventricle has normal function. The left ventricle has no regional  wall motion abnormalities. There is mild left ventricular hypertrophy.  Left ventricular diastolic parameters  were normal.  2. Right ventricular systolic function is normal. The right ventricular  size is normal. There is moderately elevated pulmonary artery systolic  pressure.  3. Left atrial size was moderately dilated.  4. The mitral valve is abnormal. Trivial mitral valve regurgitation. No  evidence of mitral stenosis.  5. 25 mm Edwards bioprosthetic valve mild calcification of leaflets  gradients stable since prior TTE cone 02/28/22 . The aortic valve has been  repaired/replaced. Aortic valve regurgitation is not visualized. No aortic  stenosis is present. There is a 25  mm Edwards bioprosthetic valve present in the aortic position. Procedure  Date: 10/28/21.  6. Aortic dilatation noted. There is mild dilatation of the aortic root,  measuring 38 mm.  7. The inferior vena cava is normal in size with greater than 50%  respiratory variability, suggesting right atrial pressure of 3 mmHg.    )        Anesthesia Quick Evaluation

## 2024-03-28 NOTE — Progress Notes (Signed)
 Anesthesia Chart Review:  73 year old male follows with cardiology for history of PAF on Xarelto , HTN, OSA on CPAP, h/o bicuspid aortic valve with aortic insufficiency and dilated aortic root s/p Bentall procedure with tissue valve 2018, no CAD at cath done 06/12/17 prior to surgery.  Echo 02/2023 showed EF 55 to 60%, normal RV systolic function, moderately elevated PASP, stable bioprosthetic aortic valve without significant regurgitation or stenosis.  Seen by Herbert Loh, PA-C on 03/21/2024 for preop eval.  Per note, "preop clearance for Lumbar decompression by Dr. Orvan Blanch- patient is on Xarelto  and has been cleared by our pharmacy team to hold Xarelto  3 days prior to surgery. Patients METs>5 and he's a reasonable risk to proceed with above surgery. He is at risk of recurrent Afib with his prior history. According to the Revised Cardiac Risk Index (RCRI), his Perioperative Risk of Major Cardiac Event is (%): 6.6. His Functional Capacity in METs is: 5.62 according to the [employer] Activity Status Index (DASI)."  Patient reports last dose Xarelto  03/27/2024.  Other pertinent history includes hypothyroidism, migraines, chronic cough (possible chronic tic disorder per pulmonology notes), CVA with poststroke chorea, GERD on PPI.  Preop labs reviewed, unremarkable.  EKG 03/21/24: Normal sinus rhythm.  Rate 92. Left anterior fascicular block. Left ventricular hypertrophy with QRS widening and repolarization abnormality  CT chest 10/30/2023: IMPRESSION: 1. Interval resolution of the previously noted part solid nodule on prior examination of 06/01/2023 in keeping with infectious or inflammatory process on prior examination. No suspicious or indeterminate pulmonary nodules are identified. 2. Status post aortic valve replacement and ascending aortic tube graft repair. 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 Recommend annual imaging followup by CTA or MRA. This  recommendation follows 2010 ACCF/AHA/AATS/ACR/ASA/SCA/SCAI/SIR/STS/SVM Guidelines for the Diagnosis and Management of Patients with Thoracic Aortic Disease. Circulation.2010; 121: V956-L875. Aortic aneurysm NOS (ICD10-I71.9) 3. Minimal coronary artery calcification. 4. Morphologic changes in keeping with pulmonary arterial hypertension.  TTE 03/05/2023: 1. Left ventricular ejection fraction, by estimation, is 55 to 60%. The  left ventricle has normal function. The left ventricle has no regional  wall motion abnormalities. There is mild left ventricular hypertrophy.  Left ventricular diastolic parameters  were normal.   2. Right ventricular systolic function is normal. The right ventricular  size is normal. There is moderately elevated pulmonary artery systolic  pressure.   3. Left atrial size was moderately dilated.   4. The mitral valve is abnormal. Trivial mitral valve regurgitation. No  evidence of mitral stenosis.   5. 25 mm Edwards bioprosthetic valve mild calcification of leaflets  gradients stable since prior TTE cone 02/28/22 . The aortic valve has been  repaired/replaced. Aortic valve regurgitation is not visualized. No aortic  stenosis is present. There is a 25  mm Edwards bioprosthetic valve present in the aortic position. Procedure  Date: 10/28/21.   6. Aortic dilatation noted. There is mild dilatation of the aortic root,  measuring 38 mm.   7. The inferior vena cava is normal in size with greater than 50%  respiratory variability, suggesting right atrial pressure of 3 mmHg.     Edilia Gordon [facility] Short Stay Center/Anesthesiology Phone [phone] 03/28/2024 3:40 PM

## 2024-03-31 ENCOUNTER — Other Ambulatory Visit: Payer: Self-pay

## 2024-03-31 ENCOUNTER — Ambulatory Visit (HOSPITAL_COMMUNITY)
Admission: RE | Disposition: A | Discharge status: Home / Self Care | Payer: Self-pay | Source: Home / Self Care | Attending: Specialist

## 2024-03-31 ENCOUNTER — Observation Stay (HOSPITAL_COMMUNITY)
Admission: RE | Admit: 2024-03-31 | Discharge: 2024-04-01 | Disposition: A | Discharge status: Home / Self Care | Attending: Specialist | Admitting: Specialist

## 2024-03-31 ENCOUNTER — Ambulatory Visit (HOSPITAL_COMMUNITY): Admitting: Anesthesiology

## 2024-03-31 ENCOUNTER — Ambulatory Visit (HOSPITAL_COMMUNITY): Payer: Self-pay | Admitting: Physician Assistant

## 2024-03-31 ENCOUNTER — Ambulatory Visit (HOSPITAL_COMMUNITY)

## 2024-03-31 ENCOUNTER — Encounter (HOSPITAL_COMMUNITY): Payer: Self-pay | Admitting: Specialist

## 2024-03-31 DIAGNOSIS — M48061 Spinal stenosis, lumbar region without neurogenic claudication: Principal | ICD-10-CM | POA: Insufficient documentation

## 2024-03-31 DIAGNOSIS — M5186 Other intervertebral disc disorders, lumbar region: Secondary | ICD-10-CM | POA: Diagnosis not present

## 2024-03-31 DIAGNOSIS — J45909 Unspecified asthma, uncomplicated: Secondary | ICD-10-CM | POA: Diagnosis not present

## 2024-03-31 DIAGNOSIS — Z79899 Other long term (current) drug therapy: Secondary | ICD-10-CM | POA: Insufficient documentation

## 2024-03-31 DIAGNOSIS — Z8616 Personal history of COVID-19: Secondary | ICD-10-CM | POA: Diagnosis not present

## 2024-03-31 DIAGNOSIS — Z981 Arthrodesis status: Secondary | ICD-10-CM | POA: Diagnosis not present

## 2024-03-31 DIAGNOSIS — Z7901 Long term (current) use of anticoagulants: Secondary | ICD-10-CM | POA: Insufficient documentation

## 2024-03-31 DIAGNOSIS — Z87891 Personal history of nicotine dependence: Secondary | ICD-10-CM | POA: Insufficient documentation

## 2024-03-31 DIAGNOSIS — M5116 Intervertebral disc disorders with radiculopathy, lumbar region: Secondary | ICD-10-CM | POA: Diagnosis not present

## 2024-03-31 DIAGNOSIS — Z8673 Personal history of transient ischemic attack (TIA), and cerebral infarction without residual deficits: Secondary | ICD-10-CM | POA: Insufficient documentation

## 2024-03-31 DIAGNOSIS — E039 Hypothyroidism, unspecified: Secondary | ICD-10-CM | POA: Insufficient documentation

## 2024-03-31 DIAGNOSIS — I4891 Unspecified atrial fibrillation: Secondary | ICD-10-CM

## 2024-03-31 DIAGNOSIS — I480 Paroxysmal atrial fibrillation: Secondary | ICD-10-CM | POA: Diagnosis not present

## 2024-03-31 DIAGNOSIS — M5126 Other intervertebral disc displacement, lumbar region: Secondary | ICD-10-CM | POA: Diagnosis present

## 2024-03-31 DIAGNOSIS — I10 Essential (primary) hypertension: Secondary | ICD-10-CM | POA: Insufficient documentation

## 2024-03-31 DIAGNOSIS — M5127 Other intervertebral disc displacement, lumbosacral region: Secondary | ICD-10-CM | POA: Diagnosis not present

## 2024-03-31 DIAGNOSIS — M48062 Spinal stenosis, lumbar region with neurogenic claudication: Principal | ICD-10-CM

## 2024-03-31 HISTORY — PX: DECOMPRESSIVE LUMBAR LAMINECTOMY LEVEL 1: SHX5791

## 2024-03-31 SURGERY — DECOMPRESSIVE LUMBAR LAMINECTOMY LEVEL 1
Anesthesia: General

## 2024-03-31 MED ORDER — HYDROMORPHONE HCL 1 MG/ML IJ SOLN: 0.2500 mg | INTRAMUSCULAR | Status: DC | PRN

## 2024-03-31 MED ORDER — ALBUMIN HUMAN 5 % IV SOLN: INTRAVENOUS | Status: DC | PRN

## 2024-03-31 MED ORDER — LACTATED RINGERS IV SOLN: INTRAVENOUS | Status: DC

## 2024-03-31 MED ORDER — SODIUM CHLORIDE 0.9 % IV SOLN: 250.0000 mL | INTRAVENOUS | Status: DC

## 2024-03-31 MED ORDER — HYDROMORPHONE HCL 1 MG/ML IJ SOLN: 1.0000 mg | INTRAMUSCULAR | Status: DC | PRN

## 2024-03-31 MED ORDER — AZELASTINE HCL 0.1 % NA SOLN: 1.0000 | Freq: Two times a day (BID) | NASAL | Status: DC | PRN

## 2024-03-31 MED ORDER — COLCHICINE 0.6 MG PO TABS
0.6000 mg | ORAL_TABLET | Freq: Every day | ORAL | Status: DC
  Administered 2024-04-01: 0.6 mg via ORAL
  Filled 2024-03-31: qty 1

## 2024-03-31 MED ORDER — CEFAZOLIN SODIUM-DEXTROSE 2-4 GM/100ML-% IV SOLN: 2.0000 g | Freq: Three times a day (TID) | INTRAVENOUS | Status: DC

## 2024-03-31 MED ORDER — ALUM & MAG HYDROXIDE-SIMETH 200-200-20 MG/5ML PO SUSP: 30.0000 mL | Freq: Four times a day (QID) | ORAL | Status: DC | PRN

## 2024-03-31 MED ORDER — PROPOFOL 10 MG/ML IV BOLUS
INTRAVENOUS | Status: DC | PRN
  Administered 2024-03-31: 200 mg via INTRAVENOUS

## 2024-03-31 MED ORDER — SODIUM CHLORIDE 0.9% FLUSH: 3.0000 mL | Freq: Two times a day (BID) | INTRAVENOUS | Status: DC

## 2024-03-31 MED ORDER — DEXAMETHASONE SODIUM PHOSPHATE 10 MG/ML IJ SOLN
INTRAMUSCULAR | Status: AC
  Filled 2024-03-31: qty 1

## 2024-03-31 MED ORDER — GUAIFENESIN ER 600 MG PO TB12: 1200.0000 mg | ORAL_TABLET | Freq: Two times a day (BID) | ORAL | Status: DC | PRN

## 2024-03-31 MED ORDER — OXYCODONE HCL 5 MG PO TABS
10.0000 mg | ORAL_TABLET | ORAL | Status: DC | PRN
  Administered 2024-04-01: 10 mg via ORAL
  Filled 2024-03-31 (×2): qty 2

## 2024-03-31 MED ORDER — TOPIRAMATE 25 MG PO TABS
25.0000 mg | ORAL_TABLET | Freq: Every day | ORAL | Status: DC
  Administered 2024-03-31 – 2024-04-01 (×2): 25 mg via ORAL
  Filled 2024-03-31 (×2): qty 1

## 2024-03-31 MED ORDER — PHENYLEPHRINE HCL-NACL 20-0.9 MG/250ML-% IV SOLN
INTRAVENOUS | Status: DC | PRN
  Administered 2024-03-31: 30 ug/min via INTRAVENOUS

## 2024-03-31 MED ORDER — FUROSEMIDE 40 MG PO TABS
40.0000 mg | ORAL_TABLET | Freq: Every day | ORAL | Status: DC
  Administered 2024-03-31 – 2024-04-01 (×2): 40 mg via ORAL
  Filled 2024-03-31 (×2): qty 1

## 2024-03-31 MED ORDER — HYDROMORPHONE HCL 1 MG/ML IJ SOLN
0.2500 mg | INTRAMUSCULAR | Status: DC | PRN
  Administered 2024-03-31: 0.25 mg via INTRAVENOUS

## 2024-03-31 MED ORDER — PHENOL 1.4 % MT LIQD: 1.0000 | OROMUCOSAL | Status: DC | PRN

## 2024-03-31 MED ORDER — HYDROMORPHONE HCL 1 MG/ML IJ SOLN
INTRAMUSCULAR | Status: AC
  Filled 2024-03-31: qty 1

## 2024-03-31 MED ORDER — HYDROMORPHONE HCL 2 MG PO TABS
2.0000 mg | ORAL_TABLET | ORAL | Status: DC | PRN
  Administered 2024-04-01 (×2): 2 mg via ORAL
  Filled 2024-03-31 (×2): qty 1

## 2024-03-31 MED ORDER — ALBUTEROL SULFATE (2.5 MG/3ML) 0.083% IN NEBU
3.0000 mL | INHALATION_SOLUTION | Freq: Four times a day (QID) | RESPIRATORY_TRACT | Status: DC
  Filled 2024-03-31: qty 3

## 2024-03-31 MED ORDER — BUPIVACAINE-EPINEPHRINE (PF) 0.5% -1:200000 IJ SOLN
INTRAMUSCULAR | Status: AC
  Filled 2024-03-31: qty 30

## 2024-03-31 MED ORDER — ROCURONIUM BROMIDE 10 MG/ML (PF) SYRINGE
PREFILLED_SYRINGE | INTRAVENOUS | Status: AC
Start: 2024-03-31 — End: 2024-03-31
  Filled 2024-03-31: qty 10

## 2024-03-31 MED ORDER — SUMATRIPTAN SUCCINATE 100 MG PO TABS: 100.0000 mg | ORAL_TABLET | Freq: Every day | ORAL | Status: DC | PRN

## 2024-03-31 MED ORDER — EPHEDRINE SULFATE-NACL 50-0.9 MG/10ML-% IV SOSY
PREFILLED_SYRINGE | INTRAVENOUS | Status: DC | PRN
  Administered 2024-03-31: 15 mg via INTRAVENOUS
  Administered 2024-03-31 (×3): 10 mg via INTRAVENOUS

## 2024-03-31 MED ORDER — ACETAMINOPHEN 10 MG/ML IV SOLN
1000.0000 mg | INTRAVENOUS | Status: AC
  Administered 2024-03-31: 1000 mg via INTRAVENOUS
  Filled 2024-03-31: qty 100

## 2024-03-31 MED ORDER — METHOCARBAMOL 500 MG PO TABS
500.0000 mg | ORAL_TABLET | Freq: Four times a day (QID) | ORAL | Status: DC | PRN
  Administered 2024-04-01: 500 mg via ORAL
  Filled 2024-03-31 (×2): qty 1

## 2024-03-31 MED ORDER — DEXAMETHASONE SODIUM PHOSPHATE 10 MG/ML IJ SOLN
INTRAMUSCULAR | Status: DC | PRN
  Administered 2024-03-31: 10 mg via INTRAVENOUS

## 2024-03-31 MED ORDER — PANTOPRAZOLE SODIUM 40 MG PO TBEC
80.0000 mg | DELAYED_RELEASE_TABLET | Freq: Every day | ORAL | Status: DC
  Administered 2024-04-01: 80 mg via ORAL
  Filled 2024-03-31: qty 2

## 2024-03-31 MED ORDER — ONDANSETRON HCL 4 MG/2ML IJ SOLN
INTRAMUSCULAR | Status: AC
  Filled 2024-03-31: qty 2

## 2024-03-31 MED ORDER — DEXMEDETOMIDINE HCL IN NACL 80 MCG/20ML IV SOLN
INTRAVENOUS | Status: DC | PRN
  Administered 2024-03-31: 4 ug via INTRAVENOUS

## 2024-03-31 MED ORDER — TRANEXAMIC ACID-NACL 1000-0.7 MG/100ML-% IV SOLN
1000.0000 mg | INTRAVENOUS | Status: AC
  Administered 2024-03-31: 1000 mg via INTRAVENOUS
  Filled 2024-03-31: qty 100

## 2024-03-31 MED ORDER — SALINE SPRAY 0.65 % NA SOLN
1.0000 | Freq: Two times a day (BID) | NASAL | Status: DC
  Administered 2024-04-01: 1 via NASAL
  Filled 2024-03-31: qty 44

## 2024-03-31 MED ORDER — HYDROMORPHONE HCL 2 MG PO TABS: 2.0000 mg | ORAL_TABLET | ORAL | Status: DC | PRN

## 2024-03-31 MED ORDER — MAGNESIUM CITRATE PO SOLN: 1.0000 | Freq: Once | ORAL | Status: DC | PRN

## 2024-03-31 MED ORDER — METHOCARBAMOL 750 MG PO TABS
750.0000 mg | ORAL_TABLET | Freq: Three times a day (TID) | ORAL | Status: DC
  Administered 2024-03-31: 750 mg via ORAL
  Filled 2024-03-31: qty 1

## 2024-03-31 MED ORDER — PROPOFOL 10 MG/ML IV BOLUS
INTRAVENOUS | Status: AC
  Filled 2024-03-31: qty 20

## 2024-03-31 MED ORDER — HYDROMORPHONE HCL 1 MG/ML IJ SOLN
INTRAMUSCULAR | Status: AC
  Filled 2024-03-31: qty 0.5

## 2024-03-31 MED ORDER — MIDAZOLAM HCL 2 MG/2ML IJ SOLN
INTRAMUSCULAR | Status: AC
  Filled 2024-03-31: qty 2

## 2024-03-31 MED ORDER — LIOTHYRONINE SODIUM 25 MCG PO TABS
25.0000 ug | ORAL_TABLET | Freq: Every day | ORAL | Status: DC
  Filled 2024-03-31: qty 1

## 2024-03-31 MED ORDER — VENLAFAXINE HCL ER 75 MG PO CP24: 75.0000 mg | ORAL_CAPSULE | Freq: Every day | ORAL | Status: DC

## 2024-03-31 MED ORDER — SODIUM CHLORIDE 0.9 % IV SOLN
Freq: Once | INTRAVENOUS | Status: AC
  Filled 2024-03-31: qty 1000

## 2024-03-31 MED ORDER — ROPINIROLE HCL 0.25 MG PO TABS
0.2500 mg | ORAL_TABLET | Freq: Two times a day (BID) | ORAL | Status: DC
  Administered 2024-03-31 – 2024-04-01 (×2): 0.25 mg via ORAL
  Filled 2024-03-31 (×2): qty 1

## 2024-03-31 MED ORDER — OXYCODONE HCL 5 MG PO TABS: 10.0000 mg | ORAL_TABLET | ORAL | Status: DC | PRN

## 2024-03-31 MED ORDER — HYDROMORPHONE HCL 1 MG/ML IJ SOLN
1.0000 mg | INTRAMUSCULAR | Status: DC | PRN
  Administered 2024-04-01: 1 mg via INTRAVENOUS
  Filled 2024-03-31: qty 1

## 2024-03-31 MED ORDER — FLUTICASONE FUROATE-VILANTEROL 200-25 MCG/ACT IN AEPB
1.0000 | INHALATION_SPRAY | Freq: Every day | RESPIRATORY_TRACT | Status: DC
  Administered 2024-04-01: 1 via RESPIRATORY_TRACT
  Filled 2024-03-31: qty 28

## 2024-03-31 MED ORDER — LEVOTHYROXINE SODIUM 112 MCG PO TABS
112.0000 ug | ORAL_TABLET | Freq: Every day | ORAL | Status: DC
  Administered 2024-04-01: 112 ug via ORAL
  Filled 2024-03-31: qty 1

## 2024-03-31 MED ORDER — FENTANYL CITRATE (PF) 250 MCG/5ML IJ SOLN
INTRAMUSCULAR | Status: AC
  Filled 2024-03-31: qty 5

## 2024-03-31 MED ORDER — HYDROMORPHONE HCL 1 MG/ML IJ SOLN
INTRAMUSCULAR | Status: DC | PRN
  Administered 2024-03-31 (×2): .5 mg via INTRAVENOUS

## 2024-03-31 MED ORDER — MENTHOL 3 MG MT LOZG: 1.0000 | LOZENGE | OROMUCOSAL | Status: DC | PRN

## 2024-03-31 MED ORDER — ONDANSETRON HCL 4 MG/2ML IJ SOLN: 4.0000 mg | Freq: Four times a day (QID) | INTRAMUSCULAR | Status: DC | PRN

## 2024-03-31 MED ORDER — IPRATROPIUM BROMIDE 0.06 % NA SOLN
2.0000 | Freq: Two times a day (BID) | NASAL | Status: DC
  Administered 2024-04-01: 2 via NASAL
  Filled 2024-03-31: qty 15

## 2024-03-31 MED ORDER — TRANEXAMIC ACID 1000 MG/10ML IV SOLN
2000.0000 mg | Freq: Once | INTRAVENOUS | Status: AC
  Administered 2024-03-31: 2000 mg via TOPICAL
  Filled 2024-03-31: qty 20

## 2024-03-31 MED ORDER — LOSARTAN POTASSIUM 50 MG PO TABS
50.0000 mg | ORAL_TABLET | Freq: Every day | ORAL | Status: DC
  Administered 2024-03-31 – 2024-04-01 (×2): 50 mg via ORAL
  Filled 2024-03-31 (×2): qty 1

## 2024-03-31 MED ORDER — KCL IN DEXTROSE-NACL 20-5-0.45 MEQ/L-%-% IV SOLN
INTRAVENOUS | Status: DC
  Filled 2024-03-31: qty 1000

## 2024-03-31 MED ORDER — KETAMINE HCL 50 MG/5ML IJ SOSY
PREFILLED_SYRINGE | INTRAMUSCULAR | Status: AC
  Filled 2024-03-31: qty 5

## 2024-03-31 MED ORDER — ACETAMINOPHEN 325 MG PO TABS: 650.0000 mg | ORAL_TABLET | ORAL | Status: DC | PRN

## 2024-03-31 MED ORDER — BISACODYL 5 MG PO TBEC
5.0000 mg | DELAYED_RELEASE_TABLET | Freq: Every day | ORAL | Status: DC | PRN
Start: 2024-03-31 — End: 2024-04-01
  Administered 2024-03-31: 5 mg via ORAL
  Filled 2024-03-31: qty 1

## 2024-03-31 MED ORDER — METHOCARBAMOL 500 MG PO TABS: 500.0000 mg | ORAL_TABLET | Freq: Four times a day (QID) | ORAL | Status: DC | PRN

## 2024-03-31 MED ORDER — LIDOCAINE 2% (20 MG/ML) 5 ML SYRINGE
INTRAMUSCULAR | Status: DC | PRN
  Administered 2024-03-31: 80 mg via INTRAVENOUS

## 2024-03-31 MED ORDER — TRANEXAMIC ACID-NACL 1000-0.7 MG/100ML-% IV SOLN
INTRAVENOUS | Status: AC
  Filled 2024-03-31: qty 100

## 2024-03-31 MED ORDER — MIDAZOLAM HCL 2 MG/2ML IJ SOLN
INTRAMUSCULAR | Status: DC | PRN
  Administered 2024-03-31: 2 mg via INTRAVENOUS

## 2024-03-31 MED ORDER — SACCHAROMYCES BOULARDII 250 MG PO CAPS
250.0000 mg | ORAL_CAPSULE | Freq: Every morning | ORAL | Status: DC
  Administered 2024-04-01: 250 mg via ORAL
  Filled 2024-03-31: qty 1

## 2024-03-31 MED ORDER — SUGAMMADEX SODIUM 200 MG/2ML IV SOLN
INTRAVENOUS | Status: DC | PRN
  Administered 2024-03-31 (×2): 200 mg via INTRAVENOUS

## 2024-03-31 MED ORDER — FINASTERIDE 5 MG PO TABS
5.0000 mg | ORAL_TABLET | Freq: Every day | ORAL | Status: DC
  Administered 2024-03-31: 5 mg via ORAL
  Filled 2024-03-31: qty 1

## 2024-03-31 MED ORDER — CHLORHEXIDINE GLUCONATE 0.12 % MT SOLN
15.0000 mL | Freq: Once | OROMUCOSAL | Status: AC
  Administered 2024-03-31: 15 mL via OROMUCOSAL
  Filled 2024-03-31: qty 15

## 2024-03-31 MED ORDER — POLYETHYLENE GLYCOL 3350 17 G PO PACK
17.0000 g | PACK | Freq: Every day | ORAL | Status: DC | PRN
Start: 2024-03-31 — End: 2024-04-01

## 2024-03-31 MED ORDER — ZOLPIDEM TARTRATE 5 MG PO TABS
10.0000 mg | ORAL_TABLET | Freq: Every evening | ORAL | Status: DC | PRN
  Administered 2024-03-31: 10 mg via ORAL
  Filled 2024-03-31: qty 2

## 2024-03-31 MED ORDER — ONDANSETRON HCL 4 MG/2ML IJ SOLN
INTRAMUSCULAR | Status: DC | PRN
  Administered 2024-03-31: 4 mg via INTRAVENOUS

## 2024-03-31 MED ORDER — ALPRAZOLAM 0.25 MG PO TABS
0.2500 mg | ORAL_TABLET | Freq: Three times a day (TID) | ORAL | Status: DC | PRN
  Administered 2024-04-01: 0.25 mg via ORAL
  Filled 2024-03-31: qty 1

## 2024-03-31 MED ORDER — ROCURONIUM BROMIDE 10 MG/ML (PF) SYRINGE
PREFILLED_SYRINGE | INTRAVENOUS | Status: DC | PRN
  Administered 2024-03-31: 5 mg via INTRAVENOUS
  Administered 2024-03-31: 40 mg via INTRAVENOUS
  Administered 2024-03-31: 70 mg via INTRAVENOUS
  Administered 2024-03-31: 10 mg via INTRAVENOUS

## 2024-03-31 MED ORDER — ONDANSETRON HCL 4 MG PO TABS: 4.0000 mg | ORAL_TABLET | Freq: Four times a day (QID) | ORAL | Status: DC | PRN

## 2024-03-31 MED ORDER — LORATADINE 10 MG PO TABS
10.0000 mg | ORAL_TABLET | Freq: Every day | ORAL | Status: DC
  Administered 2024-03-31 – 2024-04-01 (×2): 10 mg via ORAL
  Filled 2024-03-31 (×2): qty 1

## 2024-03-31 MED ORDER — ACETAMINOPHEN 500 MG PO TABS: 1000.0000 mg | ORAL_TABLET | Freq: Once | ORAL | Status: DC

## 2024-03-31 MED ORDER — BUPIVACAINE-EPINEPHRINE 0.5% -1:200000 IJ SOLN
INTRAMUSCULAR | Status: DC | PRN
  Administered 2024-03-31: 4 mL

## 2024-03-31 MED ORDER — ACETAMINOPHEN 650 MG RE SUPP: 650.0000 mg | RECTAL | Status: DC | PRN

## 2024-03-31 MED ORDER — SODIUM CHLORIDE 0.9% FLUSH: 3.0000 mL | INTRAVENOUS | Status: DC | PRN

## 2024-03-31 MED ORDER — ORAL CARE MOUTH RINSE: 15.0000 mL | Freq: Once | OROMUCOSAL | Status: AC

## 2024-03-31 MED ORDER — METOPROLOL TARTRATE 50 MG PO TABS
50.0000 mg | ORAL_TABLET | Freq: Two times a day (BID) | ORAL | Status: DC
  Administered 2024-04-01: 50 mg via ORAL
  Filled 2024-03-31 (×3): qty 1

## 2024-03-31 MED ORDER — THROMBIN 20000 UNITS EX KIT
PACK | CUTANEOUS | Status: AC
  Filled 2024-03-31: qty 1

## 2024-03-31 MED ORDER — ACETAMINOPHEN 500 MG PO TABS
1000.0000 mg | ORAL_TABLET | Freq: Four times a day (QID) | ORAL | Status: DC
  Administered 2024-03-31 – 2024-04-01 (×2): 1000 mg via ORAL
  Filled 2024-03-31 (×3): qty 2

## 2024-03-31 MED ORDER — KETAMINE HCL 10 MG/ML IJ SOLN
INTRAMUSCULAR | Status: DC | PRN
  Administered 2024-03-31: 50 mg via INTRAVENOUS

## 2024-03-31 MED ORDER — ADULT MULTIVITAMIN W/MINERALS CH: 1.0000 | ORAL_TABLET | Freq: Every day | ORAL | Status: DC

## 2024-03-31 MED ORDER — THROMBIN 20000 UNITS EX SOLR: CUTANEOUS | Status: DC | PRN

## 2024-03-31 MED ORDER — DOCUSATE SODIUM 100 MG PO CAPS
100.0000 mg | ORAL_CAPSULE | Freq: Two times a day (BID) | ORAL | Status: DC
  Administered 2024-03-31 – 2024-04-01 (×2): 100 mg via ORAL
  Filled 2024-03-31 (×2): qty 1

## 2024-03-31 MED ORDER — CEFAZOLIN SODIUM-DEXTROSE 2-4 GM/100ML-% IV SOLN
2.0000 g | INTRAVENOUS | Status: AC
  Administered 2024-03-31: 2 g via INTRAVENOUS
  Filled 2024-03-31: qty 100

## 2024-03-31 MED ORDER — FENTANYL CITRATE (PF) 250 MCG/5ML IJ SOLN
INTRAMUSCULAR | Status: DC | PRN
  Administered 2024-03-31 (×3): 50 ug via INTRAVENOUS
  Administered 2024-03-31: 100 ug via INTRAVENOUS

## 2024-03-31 MED ORDER — FERROUS SULFATE 325 (65 FE) MG PO TABS
325.0000 mg | ORAL_TABLET | Freq: Two times a day (BID) | ORAL | Status: DC
  Administered 2024-03-31 – 2024-04-01 (×2): 325 mg via ORAL
  Filled 2024-03-31 (×2): qty 1

## 2024-03-31 SURGICAL SUPPLY — 51 items
BAG COUNTER SPONGE SURGICOUNT (BAG) ×2 IMPLANT
BAG DECANTER FOR FLEXI CONT (MISCELLANEOUS) IMPLANT
BAND RUBBER #18 3X1/16 STRL (MISCELLANEOUS) ×4 IMPLANT
BUR EGG ELITE 5.0 (BURR) IMPLANT
BUR RND DIAMOND ELITE 4.0 (BURR) IMPLANT
CLEANER TIP ELECTROSURG 2X2 (MISCELLANEOUS) ×2 IMPLANT
CNTNR URN SCR LID CUP LEK RST (MISCELLANEOUS) ×2 IMPLANT
DRAPE LAPAROTOMY 100X72X124 (DRAPES) ×2 IMPLANT
DRAPE MICROSCOPE SLANT 54X150 (MISCELLANEOUS) ×2 IMPLANT
DRAPE SHEET LG 3/4 BI-LAMINATE (DRAPES) ×2 IMPLANT
DRAPE SURG 17X11 SM STRL (DRAPES) ×2 IMPLANT
DRAPE UTILITY XL STRL (DRAPES) ×2 IMPLANT
DRSG AQUACEL AG ADV 3.5X 4 (GAUZE/BANDAGES/DRESSINGS) IMPLANT
DRSG AQUACEL AG ADV 3.5X 6 (GAUZE/BANDAGES/DRESSINGS) IMPLANT
DRSG TELFA 3X8 NADH STRL (GAUZE/BANDAGES/DRESSINGS) IMPLANT
DURAPREP 26ML APPLICATOR (WOUND CARE) ×2 IMPLANT
DURASEAL SPINE SEALANT 3ML (MISCELLANEOUS) IMPLANT
ELECTRODE BLDE 4.0 EZ CLN MEGD (MISCELLANEOUS) IMPLANT
ELECTRODE REM PT RTRN 9FT ADLT (ELECTROSURGICAL) ×2 IMPLANT
EVACUATOR 1/8 PVC DRAIN (DRAIN) IMPLANT
EVACUATOR 3/16 PVC DRAIN (DRAIN) IMPLANT
GLOVE BIOGEL PI IND STRL 7.5 (GLOVE) ×2 IMPLANT
GLOVE SURG SS PI 7.0 STRL IVOR (GLOVE) ×2 IMPLANT
GLOVE SURG SS PI 8.0 STRL IVOR (GLOVE) ×4 IMPLANT
GOWN STRL REUS W/ TWL LRG LVL3 (GOWN DISPOSABLE) ×2 IMPLANT
GOWN STRL REUS W/ TWL XL LVL3 (GOWN DISPOSABLE) ×2 IMPLANT
IV CATH 14GX2 1/4 (CATHETERS) ×2 IMPLANT
KIT BASIN OR (CUSTOM PROCEDURE TRAY) ×2 IMPLANT
NDL 22X1.5 STRL (OR ONLY) (MISCELLANEOUS) ×2 IMPLANT
NDL SPNL 18GX3.5 QUINCKE PK (NEEDLE) ×4 IMPLANT
NEEDLE 22X1.5 STRL (OR ONLY) (MISCELLANEOUS) ×1 IMPLANT
NEEDLE SPNL 18GX3.5 QUINCKE PK (NEEDLE) ×3 IMPLANT
PACK LAMINECTOMY NEURO (CUSTOM PROCEDURE TRAY) ×2 IMPLANT
PATTIES SURGICAL .75X.75 (GAUZE/BANDAGES/DRESSINGS) ×2 IMPLANT
SOLUTION PRONTOSAN WOUND 350ML (IRRIGATION / IRRIGATOR) IMPLANT
SPONGE SURGIFOAM ABS GEL 100 (HEMOSTASIS) ×2 IMPLANT
SPONGE T-LAP 4X18 ~~LOC~~+RFID (SPONGE) IMPLANT
STAPLER VISISTAT (STAPLE) IMPLANT
STRIP CLOSURE SKIN 1/2X4 (GAUZE/BANDAGES/DRESSINGS) ×2 IMPLANT
SUT NURALON 4 0 TR CR/8 (SUTURE) IMPLANT
SUT PROLENE 3 0 PS 2 (SUTURE) IMPLANT
SUT VIC AB 1 CT1 27XBRD ANTBC (SUTURE) IMPLANT
SUT VIC AB 1-0 CT2 27 (SUTURE) IMPLANT
SUT VIC AB 2-0 CT1 TAPERPNT 27 (SUTURE) IMPLANT
SUT VIC AB 2-0 CT2 27 (SUTURE) IMPLANT
SYR 3ML LL SCALE MARK (SYRINGE) ×2 IMPLANT
TOWEL GREEN STERILE (TOWEL DISPOSABLE) ×2 IMPLANT
TOWEL GREEN STERILE FF (TOWEL DISPOSABLE) ×2 IMPLANT
TRAY FOLEY MTR SLVR 16FR STAT (SET/KITS/TRAYS/PACK) ×2 IMPLANT
WIPE CHG 2% 2PK PREOPERATIVE (MISCELLANEOUS) ×2 IMPLANT
YANKAUER SUCT BULB TIP NO VENT (SUCTIONS) ×2 IMPLANT

## 2024-03-31 NOTE — Transfer of Care (Signed)
 Immediate Anesthesia Transfer of Care Note  Patient: Grant [name]  Procedure(s) Performed: DECOMPRESSIVE LUMBAR LAMINECTOMY LEVEL 1  Patient Location: PACU  Anesthesia Type:General  Level of Consciousness: awake  Airway & Oxygen  Therapy: Patient Spontanous Breathing and Patient connected to face mask oxygen   Post-op Assessment: Report given to RN and Post -op Vital signs reviewed and stable  Post vital signs: Reviewed and stable  Last Vitals:  Vitals Value Taken Time  BP 166/87 03/31/24 15:01  Temp    Pulse 78 03/31/24 15:06  Resp 20 03/31/24 15:06  SpO2 93 % 03/31/24 15:06  Vitals shown include unfiled device data.  Last Pain:  Vitals:   03/31/24 1003  TempSrc:   PainSc: 3       Patients Stated Pain Goal: 1 (03/31/24 1003)  Complications: No notable events documented.

## 2024-03-31 NOTE — Anesthesia Postprocedure Evaluation (Signed)
 Anesthesia Post Note  Patient: Arville Postlewaite Mcmanaway  Procedure(s) Performed: DECOMPRESSIVE LUMBAR LAMINECTOMY LEVEL 1     Patient location during evaluation: PACU Anesthesia Type: General Level of consciousness: awake and alert Pain management: pain level controlled Vital Signs Assessment: post-procedure vital signs reviewed and stable Respiratory status: spontaneous breathing, nonlabored ventilation, respiratory function stable and patient connected to nasal cannula oxygen  Cardiovascular status: blood pressure returned to baseline and stable Postop Assessment: no apparent nausea or vomiting Anesthetic complications: no  No notable events documented.  Last Vitals:  Vitals:   03/31/24 1530 03/31/24 1545  BP: (!) 150/72 138/82  Pulse: 68 65  Resp: 16 17  Temp:    SpO2: 95% 95%    Last Pain:  Vitals:   03/31/24 1530  TempSrc:   PainSc: 7                  Lubertha Leite,W. EDMOND

## 2024-03-31 NOTE — Interval H&P Note (Signed)
 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  The various methods of treatment have been discussed with the patient and family. After consideration of risks, benefits and other options for treatment, the patient has consented to  Procedure(s) with comments: DECOMPRESSIVE LUMBAR LAMINECTOMY LEVEL 1 (N/A) - Central laminectomy , micro discectomy L3-4 as a surgical intervention.  The patient's history has been reviewed, patient examined, no change in status, stable for surgery.  I have reviewed the patient's chart and labs.  Questions were answered to the patient's satisfaction.     Loel Ring

## 2024-03-31 NOTE — Anesthesia Procedure Notes (Signed)
 Procedure Name: Intubation Date/Time: 03/31/2024 11:49 AM  Performed by: Luwanna Sam, CRNAPre-anesthesia Checklist: Patient identified, Emergency Drugs available, Suction available, Patient being monitored and Timeout performed Patient Re-evaluated:Patient Re-evaluated prior to induction Oxygen  Delivery Method: Circle system utilized Preoxygenation: Pre-oxygenation with 100% oxygen  Induction Type: IV induction Ventilation: Mask ventilation without difficulty and Oral airway inserted - appropriate to patient size Laryngoscope Size: Mac and 4 Grade View: Grade I Tube type: Oral Tube size: 7.0 mm Number of attempts: 1 Placement Confirmation: ETT inserted through vocal cords under direct vision, positive ETCO2 and breath sounds checked- equal and bilateral Secured at: 22 cm Tube secured with: Tape Dental Injury: Teeth and Oropharynx as per pre-operative assessment

## 2024-03-31 NOTE — Brief Op Note (Signed)
 03/31/2024  11:05 AM  PATIENT:  Grant [name]  73 y.o. 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  PROCEDURE:  Procedure(s) with comments: DECOMPRESSIVE LUMBAR LAMINECTOMY LEVEL 1 (N/A) - Central laminectomy , micro discectomy L3-4  SURGEON:  Surgeons and Role:    Orvan Blanch, MD - Primary  PHYSICIAN ASSISTANT:   ASSISTANTS: Bissell   ANESTHESIA:   general  EBL:  200   BLOOD ADMINISTERED:none  DRAINS: none   LOCAL MEDICATIONS USED:  MARCAINE      SPECIMEN:  No Specimen  DISPOSITION OF SPECIMEN:  N/A  COUNTS:  YES  TOURNIQUET:  * No tourniquets in log *  DICTATION: .Other Dictation: Dictation Number [id number]  PLAN OF CARE: Admit for overnight observation  PATIENT DISPOSITION:  PACU - hemodynamically stable.   Delay start of Pharmacological VTE agent (>24hrs) due to surgical blood loss or risk of bleeding: yes  Patient had hematuria following the insertion of the Foley catheter.  Moderate difficulty prior to the inflation of the balloon there was some hematuria advanced following that inflation of the balloon without difficulty.  Patient has a history of BPH.  I called a urology consult who recommended continuing to keep the Foley in overnight and they would see him in the morning.  Can decide at that point in time for disposition.  I spoke with his wife about that as well.

## 2024-04-01 ENCOUNTER — Encounter (HOSPITAL_COMMUNITY): Payer: Self-pay | Admitting: Specialist

## 2024-04-01 DIAGNOSIS — J45909 Unspecified asthma, uncomplicated: Secondary | ICD-10-CM | POA: Diagnosis not present

## 2024-04-01 DIAGNOSIS — Z79899 Other long term (current) drug therapy: Secondary | ICD-10-CM | POA: Diagnosis not present

## 2024-04-01 DIAGNOSIS — T83098D Other mechanical complication of other indwelling urethral catheter, subsequent encounter: Secondary | ICD-10-CM | POA: Diagnosis not present

## 2024-04-01 DIAGNOSIS — M5186 Other intervertebral disc disorders, lumbar region: Secondary | ICD-10-CM | POA: Diagnosis not present

## 2024-04-01 DIAGNOSIS — M48061 Spinal stenosis, lumbar region without neurogenic claudication: Secondary | ICD-10-CM | POA: Diagnosis not present

## 2024-04-01 DIAGNOSIS — Z8616 Personal history of COVID-19: Secondary | ICD-10-CM | POA: Diagnosis not present

## 2024-04-01 DIAGNOSIS — I480 Paroxysmal atrial fibrillation: Secondary | ICD-10-CM | POA: Diagnosis not present

## 2024-04-01 DIAGNOSIS — R310 Gross hematuria: Secondary | ICD-10-CM | POA: Diagnosis not present

## 2024-04-01 DIAGNOSIS — Z8673 Personal history of transient ischemic attack (TIA), and cerebral infarction without residual deficits: Secondary | ICD-10-CM | POA: Diagnosis not present

## 2024-04-01 DIAGNOSIS — Z7901 Long term (current) use of anticoagulants: Secondary | ICD-10-CM | POA: Diagnosis not present

## 2024-04-01 DIAGNOSIS — E039 Hypothyroidism, unspecified: Secondary | ICD-10-CM | POA: Diagnosis not present

## 2024-04-01 DIAGNOSIS — I10 Essential (primary) hypertension: Secondary | ICD-10-CM | POA: Diagnosis not present

## 2024-04-01 DIAGNOSIS — Z87891 Personal history of nicotine dependence: Secondary | ICD-10-CM | POA: Diagnosis not present

## 2024-04-01 LAB — CBC
HCT: 42.4 % (ref 39.0–52.0)
Hemoglobin: 14.1 g/dL (ref 13.0–17.0)
MCH: 31.8 pg (ref 26.0–34.0)
MCHC: 33.3 g/dL (ref 30.0–36.0)
MCV: 95.7 fL (ref 80.0–100.0)
Platelets: 137 10*3/uL — ABNORMAL LOW (ref 150–400)
RBC: 4.43 MIL/uL (ref 4.22–5.81)
RDW: 13.5 % (ref 11.5–15.5)
WBC: 12.5 10*3/uL — ABNORMAL HIGH (ref 4.0–10.5)
nRBC: 0 % (ref 0.0–0.2)

## 2024-04-01 LAB — BASIC METABOLIC PANEL WITH GFR
Anion gap: 10 (ref 5–15)
BUN: 14 mg/dL (ref 8–23)
CO2: 25 mmol/L (ref 22–32)
Calcium: 9.2 mg/dL (ref 8.9–10.3)
Chloride: 105 mmol/L (ref 98–111)
Creatinine, Ser: 0.98 mg/dL (ref 0.61–1.24)
GFR, Estimated: >60 mL/min (ref >60)
Glucose, Bld: 85 mg/dL (ref 70–99)
Potassium: 4.1 mmol/L (ref 3.5–5.1)
Sodium: 140 mmol/L (ref 135–145)

## 2024-04-01 MED ORDER — HYDROMORPHONE HCL 2 MG PO TABS: 2.0000 mg | ORAL_TABLET | ORAL | 0 refills | Status: DC | PRN

## 2024-04-01 MED ORDER — ALBUTEROL SULFATE (2.5 MG/3ML) 0.083% IN NEBU: 3.0000 mL | INHALATION_SOLUTION | Freq: Four times a day (QID) | RESPIRATORY_TRACT | Status: DC | PRN

## 2024-04-01 MED ORDER — VENLAFAXINE HCL ER 75 MG PO CP24
75.0000 mg | ORAL_CAPSULE | Freq: Every day | ORAL | Status: DC
  Administered 2024-04-01: 75 mg via ORAL
  Filled 2024-04-01: qty 1

## 2024-04-01 MED ORDER — DOCUSATE SODIUM 100 MG PO CAPS: 100.0000 mg | ORAL_CAPSULE | Freq: Two times a day (BID) | ORAL | 2 refills | Status: DC

## 2024-04-01 MED ORDER — ADULT MULTIVITAMIN W/MINERALS CH
1.0000 | ORAL_TABLET | Freq: Every day | ORAL | Status: DC
  Administered 2024-04-01: 1 via ORAL
  Filled 2024-04-01: qty 1

## 2024-04-01 MED ORDER — POLYETHYLENE GLYCOL 3350 17 G PO PACK: 17.0000 g | PACK | Freq: Every day | ORAL | 0 refills | Status: AC

## 2024-04-01 MED FILL — Thrombin For Soln Kit 20000 Unit: CUTANEOUS | Qty: 1 | Status: AC

## 2024-04-01 NOTE — Consult Note (Addendum)
 Urology Consult Note   Requesting Attending Physician:  Orvan Blanch, MD Service Providing Consult: Urology  Consulting Attending: Dr. Freddi Jaeger   Reason for Consult:  hematuria, traumatic Foley placement  HPI: Grant [name] is seen in consultation for reasons noted above at the request of Orvan Blanch, MD. patient is a 73 year old male known to our practice and followed by Dr. Derrick Fling.  S/p aqua ablation.  He presented to hospital for scheduled decompressive lumbar laminectomy with Dr. Susana Enter being on 03/31/2024.  There was some difficulty with Foley catheter placement intraoperatively which, from their report, resulted in hematuria prior to the Foley catheter reaching the bladder. Urology was consulted to assess prior to Foley catheter removal and discharge.  I waited to see him until the following morning because the interventions that I would have put in place had already been performed.  He was resting in bed comfortably and his urine was only lightly blushed with blood.  I reviewed case and plan with he and his wife, all questions were answered to their satisfaction. ------------------  Assessment:   73 y.o. male with with traumatic Foley placement   Recommendations: # Gross hematuria Thankfully Xarelto  had been held for 5 days prior to his surgery and will be held for several days afterwards.  Also he had been provided with a gram of TXA intraoperatively already which along with a night of watchful waiting would be my first line treatment for a mild trauma.  His bleeding is almost completely resolved and I had his Foley catheter removed this morning.  He understands he may see some mild blushing over the course of the next couple of days.  Should his bleeding still be persisting mildly when is time to restart his Xarelto , he needs to reach out to cardiology and see if it is safe to hold until his bleeding has completely resolved.  If not he will reach out to urology. Okay to  discharge from urologic perspective   Case and plan discussed with Dr. Freddi Jaeger  Past Medical History: Past Medical History:  Diagnosis Date   Allergic rhinitis    Anxiety    Arthritis    Asthma    Bladder tumor    Chronic fatigue    Complication of anesthesia    Agitation afterward   COVID-19 05/22/2021   Depression    06/30/17 Pt denies being depressed, reports Effexor  is taken for Chronic Fatigue    Dyspnea    Dysrhythmia    A. Fib   Enlarged prostate    Fibromyalgia    GERD (gastroesophageal reflux disease)    History of chronic bronchitis    History of migraine    light and sound sensitive   History of toxic encephalopathy    Hypothyroidism    OSA on CPAP    CPAP 14   PAF (paroxysmal atrial fibrillation) (HCC) CARDIOLOGIST -- DR Stann Earnest   DX OCT 2013   Pneumonia 2023   S/P AVR (aortic valve replacement) and aortoplasty    Stroke [facility])    has post stroke chorea   Unspecified essential hypertension    Urethral tumor    PROSTATIC    Past Surgical History:  Past Surgical History:  Procedure Laterality Date   BENTALL PROCEDURE N/A 07/02/2017   Procedure: BENTALL PROCEDURE;  Surgeon: Heriberto London, MD;  Location: [facility] OR;  Service: Open Heart Surgery;  Laterality: N/A;  WITH CIRC ARREST   BENTALL PROCEDURE  07/02/2017   w/ Aortic valve replacement  by Dr. Matt Song   BIOPSY  02/06/2021   Procedure: BIOPSY;  Surgeon: Ozell Blunt, MD;  Location: [facility] ENDOSCOPY;  Service: Endoscopy;;   BUBBLE STUDY  11/01/2021   Procedure: BUBBLE STUDY;  Surgeon: Harrold Lincoln, MD;  Location: [facility] ENDOSCOPY;  Service: Cardiovascular;;   CARDIOVERSION N/A 11/28/2021   Procedure: CARDIOVERSION (CATH LAB);  Surgeon: Boyce Byes, MD;  Location: [facility] INVASIVE CV LAB;  Service: Cardiovascular;  Laterality: N/A;   CARDIOVERSION N/A 07/23/2023   Procedure: CARDIOVERSION;  Surgeon: Lenise Quince, MD;  Location: [facility] INVASIVE CV LAB;  Service: Cardiovascular;  Laterality: N/A;    COLONOSCOPY     COLONOSCOPY WITH PROPOFOL  N/A 02/06/2021   Procedure: COLONOSCOPY WITH PROPOFOL ;  Surgeon: Ozell Blunt, MD;  Location: [facility] ENDOSCOPY;  Service: Endoscopy;  Laterality: N/A;   CORONARY ANGIOPLASTY  06/12/2017   CYSTOSCOPY W/ RETROGRADES Bilateral 06/15/2013   Procedure: CYSTOSCOPY WITH BILATERAL RETROGRADE PYELOGRAM  BLADDER BIOPSY, PROSTATIC URETHRAL BIOPSY, ;  Surgeon: Soledad Dupes, MD;  Location: [facility];  Service: Urology;  Laterality: Bilateral;   DECOMPRESSIVE LUMBAR LAMINECTOMY LEVEL 1 N/A 03/31/2024   Procedure: DECOMPRESSIVE LUMBAR LAMINECTOMY LEVEL 1;  Surgeon: Orvan Blanch, MD;  Location: [facility] OR;  Service: Orthopedics;  Laterality: N/A;  Central laminectomy , micro discectomy L3-4   ESOPHAGOGASTRODUODENOSCOPY (EGD) WITH PROPOFOL  N/A 03/11/2018   Procedure: ESOPHAGOGASTRODUODENOSCOPY (EGD) WITH PROPOFOL ;  Surgeon: Ozell Blunt, MD;  Location: [facility] ENDOSCOPY;  Service: Endoscopy;  Laterality: N/A;   EXCISION OF MESH N/A 06/11/2022   Procedure: REMOVAL OF OLD MESH;  Surgeon: Candyce Champagne, MD;  Location: [facility] ORS;  Service: General;  Laterality: N/A;   LAPAROSCOPIC CHOLECYSTECTOMY  01/14/2001   PROCTOSCOPY N/A 06/12/2021   Procedure: RIGID PROCTOSCOPY;  Surgeon: Candyce Champagne, MD;  Location: [facility] ORS;  Service: General;  Laterality: N/A;   RIGHT/LEFT HEART CATH AND CORONARY ANGIOGRAPHY N/A 06/12/2017   Procedure: RIGHT/LEFT HEART CATH AND CORONARY ANGIOGRAPHY;  Surgeon: Darlis Eisenmenger, MD;  Location: [facility] INVASIVE CV LAB;  Service: Cardiovascular;  Laterality: N/A;   TEE WITHOUT CARDIOVERSION N/A 07/02/2017   Procedure: TRANSESOPHAGEAL ECHOCARDIOGRAM (TEE);  Surgeon: Matt Song, Donata Fryer, MD;  Location: [facility] OR;  Service: Open Heart Surgery;  Laterality: N/A;   TEE WITHOUT CARDIOVERSION N/A 11/01/2021   Procedure: TRANSESOPHAGEAL ECHOCARDIOGRAM (TEE);  Surgeon: Harrold Lincoln, MD;  Location: [facility] ENDOSCOPY;  Service: Cardiovascular;  Laterality: N/A;    UMBILICAL HERNIA REPAIR  01/14/2008   VENTRAL HERNIA REPAIR N/A 06/11/2022   Procedure: LAPAROSCOPIC VENTRAL HERNIA WITH LYSIS OF ADHESIONS;  Surgeon: Candyce Champagne, MD;  Location: [facility] ORS;  Service: General;  Laterality: N/A;  WITH MESH   WISDOM TOOTH EXTRACTION      Medication: Current Facility-Administered Medications  Medication Dose Route Frequency Provider Last Rate Last Admin   0.9 %  sodium chloride  infusion  250 mL Intravenous Continuous Bissell, Jaclyn M, PA-C       acetaminophen  (TYLENOL ) tablet 650 mg  650 mg Oral Q4H PRN Bissell, Jaclyn M, PA-C       Or   acetaminophen  (TYLENOL ) suppository 650 mg  650 mg Rectal Q4H PRN Bissell, Jaclyn M, PA-C       acetaminophen  (TYLENOL ) tablet 1,000 mg  1,000 mg Oral Q6H Bissell, Jaclyn M, PA-C   1,000 mg at 04/01/24 0221   albuterol  (PROVENTIL ) (2.5 MG/3ML) 0.083% nebulizer solution 3 mL  3 mL Inhalation Q6H PRN Orvan Blanch, MD       ALPRAZolam  (XANAX ) tablet 0.25 mg  0.25  mg Oral TID PRN Bissell, Jaclyn M, PA-C   0.25 mg at 04/01/24 0024   alum & mag hydroxide-simeth (MAALOX/MYLANTA) 200-200-20 MG/5ML suspension 30 mL  30 mL Oral Q6H PRN Bissell, Jaclyn M, PA-C       azelastine  (ASTELIN ) 0.1 % nasal spray 1 spray  1 spray Each Nare BID PRN Bissell, Jaclyn M, PA-C       bisacodyl  (DULCOLAX) EC tablet 5 mg  5 mg Oral Daily PRN Bissell, Jaclyn M, PA-C   5 mg at 03/31/24 2054   colchicine  tablet 0.6 mg  0.6 mg Oral Daily Bissell, Jaclyn M, PA-C   0.6 mg at 04/01/24 0454   dextrose  5 % and 0.45 % NaCl with KCl 20 mEq/L infusion   Intravenous Continuous Bissell, Jaclyn M, PA-C       docusate sodium  (COLACE) capsule 100 mg  100 mg Oral BID Bissell, Jaclyn M, PA-C   100 mg at 04/01/24 0981   ferrous sulfate  tablet 325 mg  325 mg Oral BID Bissell, Jaclyn M, PA-C   325 mg at 04/01/24 1914   finasteride  (PROSCAR ) tablet 5 mg  5 mg Oral QHS Bissell, Jaclyn M, PA-C   5 mg at 03/31/24 2055   fluticasone  furoate-vilanterol (BREO ELLIPTA ) 200-25 MCG/ACT 1  puff  1 puff Inhalation Daily Bissell, Jaclyn M, PA-C   1 puff at 04/01/24 7829   furosemide  (LASIX ) tablet 40 mg  40 mg Oral Daily Bissell, Jaclyn M, PA-C   40 mg at 04/01/24 5621   guaiFENesin  (MUCINEX ) 12 hr tablet 1,200 mg  1,200 mg Oral BID PRN Bissell, Jaclyn M, PA-C       HYDROmorphone  (DILAUDID ) injection 1 mg  1 mg Intravenous Q2H PRN Bissell, Jaclyn M, PA-C   1 mg at 04/01/24 0152   HYDROmorphone  (DILAUDID ) tablet 2 mg  2 mg Oral Q4H PRN Bissell, Jaclyn M, PA-C   2 mg at 04/01/24 0907   ipratropium (ATROVENT ) 0.06 % nasal spray 2 spray  2 spray Each Nare BID Bissell, Jaclyn M, PA-C   2 spray at 04/01/24 3086   levothyroxine  (SYNTHROID ) tablet 112 mcg  112 mcg Oral QAC breakfast Bissell, Jaclyn M, PA-C       liothyronine  (CYTOMEL ) tablet 25 mcg  25 mcg Oral QAC breakfast Bissell, Jaclyn M, PA-C       loratadine  (CLARITIN ) tablet 10 mg  10 mg Oral Daily Bissell, Jaclyn M, PA-C   10 mg at 04/01/24 5784   losartan  (COZAAR ) tablet 50 mg  50 mg Oral Daily Bissell, Jaclyn M, PA-C   50 mg at 04/01/24 6962   magnesium  citrate solution 1 Bottle  1 Bottle Oral Once PRN Bissell, Jaclyn M, PA-C       menthol -cetylpyridinium (CEPACOL) lozenge 3 mg  1 lozenge Oral PRN Bissell, Jaclyn M, PA-C       Or   phenol (CHLORASEPTIC) mouth spray 1 spray  1 spray Mouth/Throat PRN Bissell, Jaclyn M, PA-C       methocarbamol  (ROBAXIN ) tablet 500 mg  500 mg Oral Q6H PRN Bissell, Jaclyn M, PA-C   500 mg at 04/01/24 0445   methocarbamol  (ROBAXIN ) tablet 750 mg  750 mg Oral TID Bissell, Jaclyn M, PA-C   750 mg at 03/31/24 2054   metoprolol  tartrate (LOPRESSOR ) tablet 50 mg  50 mg Oral BID Bissell, Jaclyn M, PA-C   50 mg at 04/01/24 0024   multivitamin with minerals tablet 1 tablet  1 tablet Oral Q breakfast Orvan Blanch, MD  1 tablet at 04/01/24 1610   ondansetron  (ZOFRAN ) tablet 4 mg  4 mg Oral Q6H PRN Bissell, Jaclyn M, PA-C       Or   ondansetron  (ZOFRAN ) injection 4 mg  4 mg Intravenous Q6H PRN Bissell, Jaclyn  M, PA-C       oxyCODONE  (Oxy IR/ROXICODONE ) immediate release tablet 10 mg  10 mg Oral Q4H PRN Bissell, Jaclyn M, PA-C   10 mg at 04/01/24 0025   pantoprazole  (PROTONIX ) EC tablet 80 mg  80 mg Oral Daily Bissell, Jaclyn M, PA-C   80 mg at 04/01/24 9604   polyethylene glycol (MIRALAX  / GLYCOLAX ) packet 17 g  17 g Oral Daily PRN Bissell, Jaclyn M, PA-C       rOPINIRole  (REQUIP ) tablet 0.25 mg  0.25 mg Oral BID Bissell, Jaclyn M, PA-C   0.25 mg at 04/01/24 5409   saccharomyces boulardii (FLORASTOR) capsule 250 mg  250 mg Oral q morning Bissell, Jaclyn M, PA-C   250 mg at 04/01/24 8119   sodium chloride  (OCEAN) 0.65 % nasal spray 1 spray  1 spray Each Nare BID Bissell, Jaclyn M, PA-C   1 spray at 04/01/24 1478   sodium chloride  flush (NS) 0.9 % injection 3 mL  3 mL Intravenous Q12H Bissell, Jaclyn M, PA-C       sodium chloride  flush (NS) 0.9 % injection 3 mL  3 mL Intravenous PRN Bissell, Jaclyn M, PA-C       SUMAtriptan  (IMITREX ) tablet 100 mg  100 mg Oral Daily PRN Bissell, Jaclyn M, PA-C       topiramate  (TOPAMAX ) tablet 25 mg  25 mg Oral Daily Bissell, Jaclyn M, PA-C   25 mg at 04/01/24 2956   venlafaxine  XR (EFFEXOR -XR) 24 hr capsule 75 mg  75 mg Oral Q breakfast Orvan Blanch, MD   75 mg at 04/01/24 2130   zolpidem  (AMBIEN ) tablet 10 mg  10 mg Oral QHS PRN Bissell, Jaclyn M, PA-C   10 mg at 03/31/24 2054    Allergies: Allergies  Allergen Reactions   Ingrezza  [Valbenazine  Tosylate] Other (See Comments)    PVC's   Perphenazine Swelling and Other (See Comments)    Tongue swelling   Amantadines     Feels bad   Amiodarone  Swelling and Other (See Comments)    Tongue swelling   Codeine Itching   Levaquin [Levofloxacin] Other (See Comments)    malaise   Prednisone Itching and Other (See Comments)    CAN TOLERATE DEPO. PREDNISONE BY MOUTH CAUSES MOOD CHANGES, INSOMNIA, AND ITCHING.    Tramadol  Other (See Comments)    Delusions/hallucinations from high doses     Social History: Social  History   Tobacco Use   Smoking status: Former    Current packs/day: 0.00    Average packs/day: 0.5 packs/day for 27.0 years (13.5 ttl pk-yrs)    Types: Cigarettes    Start date: 46    Quit date: 10/21/1979    Years since quitting: 44.4   Smokeless tobacco: Never  Vaping Use   Vaping status: Never Used  Substance Use Topics   Alcohol use: Not Currently    Alcohol/week: 10.0 standard drinks of alcohol    Types: 4 Cans of beer, 6 Standard drinks or equivalent per week   Drug use: Never    Family History Family History  Problem Relation Age of Onset   Aortic aneurysm Mother 44       cause of death   Other Father  motor vehicle accident   Heart disease Other        family history    ROS   Objective   Vital signs in last 24 hours: BP 133/73 (BP Location: Right Arm)   Pulse (!) 56   Temp 97.7 F (36.5 C) (Oral)   Resp 20   Ht 5' 10 (1.778 m)   Wt 99.8 kg   SpO2 98%   BMI 31.57 kg/m   Physical Exam General: A&O, resting, appropriate HEENT: [place]/AT Pulmonary: Normal work of breathing Cardiovascular: no cyanosis Abdomen: Soft, NTTP, nondistended GU: foley catheter with lightly blood tinged urine Neuro: Appropriate, no focal neurological deficits  Most Recent Labs: Lab Results  Component Value Date   WBC 12.7 (H) 03/25/2024   HGB 15.4 03/25/2024   HCT 46.6 03/25/2024   PLT 157 03/25/2024    Lab Results  Component Value Date   NA 138 03/25/2024   K 4.1 03/25/2024   CL 102 03/25/2024   CO2 29 03/25/2024   BUN 16 03/25/2024   CREATININE 1.10 03/25/2024   CALCIUM  9.1 03/25/2024   MG 1.7 08/14/2023    Lab Results  Component Value Date   INR 1.3 (H) 09/06/2022   APTT 41 (H) 09/06/2022     Urine Culture: @[id number] (laburin,org,[id number],[id number])@   IMAGING: DG Lumbar Spine 2-3 Views Result Date: 03/31/2024 CLINICAL DATA:  Surgical localization. EXAM: LUMBAR SPINE - 2-3 VIEW COMPARISON:  March 25, 2024. FINDINGS: Three intraoperative cross-table  lateral projections were obtained of the lumbar spine. The first image demonstrates surgical probes at the approximately L4 and S1 levels. The second image demonstrates surgical probes at approximately the L2-3 and L3-4 levels. The final image demonstrates surgical probes at the L3 and L3-4 levels. IMPRESSION: Surgical localization as described above. Electronically Signed   By: Rosalene Colon M.D.   On: 03/31/2024 16:36    ------  Alla Ar, NP Pager: [phone]   Please contact the urology consult pager with any further questions/concerns.  Agree with above.  Matt R. Zareya Tuckett MD [facility]  Pager: [phone]

## 2024-04-01 NOTE — Progress Notes (Signed)
 Subjective: 1 Day Post-Op Procedure(s) (LRB): DECOMPRESSIVE LUMBAR LAMINECTOMY LEVEL 1 (N/A) Patient reports pain as moderate.    Objective: Vital signs in last 24 hours: Temp:  [97.6 F (36.4 C)-98.3 F (36.8 C)] 97.7 F (36.5 C) (06/13 0752) Pulse Rate:  [56-83] 56 (06/13 0752) Resp:  [13-22] 20 (06/13 0752) BP: (112-166)/(66-87) 133/73 (06/13 0752) SpO2:  [93 %-99 %] 98 % (06/13 0752) FiO2 (%):  [21 %] 21 % (06/12 2253) Weight:  [99.8 kg] 99.8 kg (06/12 0921)  Intake/Output from previous day: 06/12 0701 - 06/13 0700 In: 3530 [P.O.:480; I.V.:1200; IV Piggyback:250] Out: 2745 [Urine:2475; Drains:70; Blood:200] Intake/Output this shift: No intake/output data recorded.  No results for input(s): HGB in the last 72 hours. No results for input(s): WBC, RBC, HCT, PLT in the last 72 hours. No results for input(s): NA, K, CL, CO2, BUN, CREATININE, GLUCOSE, CALCIUM  in the last 72 hours. No results for input(s): LABPT, INR in the last 72 hours.  Neurologically intact ABD soft Neurovascular intact Sensation intact distally Intact pulses distally Dorsiflexion/Plantar flexion intact Incision: dressing C/D/I and no drainage No cellulitis present Compartment soft No calf pain or sign of DVT   Assessment/Plan: 1 Day Post-Op Procedure(s) (LRB): DECOMPRESSIVE LUMBAR LAMINECTOMY LEVEL 1 (N/A) Advance diet Up with therapy D/C IV fluids Awaiting urology consult Will order CBC and Bmet for this AM Discussed with Dr Leighton Punches Plan D/C later today pending urology consult   Killian Ress M Corneisha Alvi 04/01/2024, 8:12 AM

## 2024-04-01 NOTE — Evaluation (Signed)
 Occupational Therapy Evaluation and DC Summary  Patient Details Name: Grant [name] MRN: [id number] DOB: August 31, 1951 Today's Date: 04/01/2024   History of Present Illness   Pt is a 73 yo male with herniated nucleus pulposus L3-L4 presenting to [facility] on 03/31/24 for elective back sx. S/p L3-L4 Laminectomy and Discectomy. PMH of arhtritis, anxiety, Dyspnea, fibromyalgia, GERD, hypothyroidism, TAVR (2023).     Clinical Impressions Pt admitted for above, PTA pt reports being ind with bathing but having spouse assist with LB dressing, ambulating ind at home. Pt currently post back sx, educated pt on compensatory strategies for ADLs and transfers, reinforced back precautions prn. Pt able to complete ADLs with setup to Mod I level, no longer needing assist with LB ADLs following education of compensatory strategies. Pt also ambulating with supervision, does need CGA for stairs and use of hand rails for balance. Pt otherwise without any further acute skilled OT needs. No post acute OT recommended.      If plan is discharge home, recommend the following:   Assistance with cooking/housework;Help with stairs or ramp for entrance     Functional Status Assessment   Patient has not had a recent decline in their functional status     Equipment Recommendations   None recommended by OT     Recommendations for Other Services         Precautions/Restrictions   Precautions Precautions: Fall;Back Precaution Booklet Issued: Yes (comment) Recall of Precautions/Restrictions: Intact Required Braces or Orthoses:  (no brace needed orders) Restrictions Weight Bearing Restrictions Per Provider Order: No     Mobility Bed Mobility Overal bed mobility: Needs Assistance Bed Mobility: Rolling, Sidelying to Sit, Sit to Supine Rolling: Supervision Sidelying to sit: Supervision   Sit to supine: Supervision   General bed mobility comments: demonstrated log roll without use of rails, 1 verbal cue  for proper BLE positioning with technique.    Transfers Overall transfer level: Needs assistance Equipment used: None Transfers: Sit to/from Stand Sit to Stand: Supervision                  Balance Overall balance assessment: Needs assistance Sitting-balance support: No upper extremity supported, Feet supported Sitting balance-Leahy Scale: Fair     Standing balance support: No upper extremity supported, During functional activity Standing balance-Leahy Scale: Fair                             ADL either performed or assessed with clinical judgement   ADL Overall ADL's : Needs assistance/impaired Eating/Feeding: Independent;Sitting   Grooming: Standing;Supervision/safety;Oral care Grooming Details (indicate cue type and reason): educated pt on the use of cups for spit/rinse Upper Body Bathing: Sitting;Modified independent   Lower Body Bathing: Sitting/lateral leans;Set up Lower Body Bathing Details (indicate cue type and reason): pt able to use figure four position to bathe BLEs Upper Body Dressing : Modified independent;Sitting   Lower Body Dressing: Set up;Sitting/lateral leans Lower Body Dressing Details (indicate cue type and reason): Edcuated pt on figure four position, he was able to utilize technique to don socks, underwear, and shorts Toilet Transfer: Supervision/safety;Ambulation   Toileting- Clothing Manipulation and Hygiene: Supervision/safety;Sit to/from stand Toileting - Clothing Manipulation Details (indicate cue type and reason): Pt demonstrated ability to reach bottom without twisting.     Functional mobility during ADLs: Supervision/safety General ADL Comments: Pt negotiated 8 steps with OT, his spouse supervising per request. Pt did have one mild LOB needing min A +  rail for him to catch himself. Instruct pt spouse to remain close to him during stairs and for pt to always hang onto hand rails as he was not during stairs training.      Vision         Perception         Praxis         Pertinent Vitals/Pain Pain Assessment Pain Assessment: Faces Faces Pain Scale: Hurts even more Pain Location: back Pain Descriptors / Indicators: Discomfort, Sore Pain Intervention(s): Limited activity within patient's tolerance, Monitored during session, Repositioned     Extremity/Trunk Assessment Upper Extremity Assessment Upper Extremity Assessment: Overall WFL for tasks assessed   Lower Extremity Assessment Lower Extremity Assessment: Generalized weakness (history of radicular symptoms L>R, endorsing bilat foot numbness and coldness intermittently)   Cervical / Trunk Assessment Cervical / Trunk Assessment: Back Surgery   Communication Communication Communication: No apparent difficulties   Cognition Arousal: Alert Behavior During Therapy: WFL for tasks assessed/performed Cognition: Cognition impaired           Executive functioning impairment (select all impairments): Reasoning, Problem solving OT - Cognition Comments: decreased safety awareness. Possibly baseline for pt                 Following commands: Intact       Cueing  General Comments   Cueing Techniques: Verbal cues      Exercises     Shoulder Instructions      Home Living Family/patient expects to be discharged to:: Private residence Living Arrangements: Spouse/significant other Available Help at Discharge: Family Type of Home: House Home Access: Stairs to enter Secretary/administrator of Steps: 3 Entrance Stairs-Rails: Right;Left;Can reach both Home Layout: One level (sunken den)     Bathroom Shower/Tub: Walk-in Human resources officer: Handicapped height     Home Equipment: Medical laboratory scientific officer - single point          Prior Functioning/Environment Prior Level of Function : Independent/Modified Independent             Mobility Comments: endorsing indep ADLs Comments: assist for LB dressing, indep with showering     OT Problem List: Decreased knowledge of precautions;Pain;Decreased safety awareness   OT Treatment/Interventions:        OT Goals(Current goals can be found in the care plan section)   Acute Rehab OT Goals Patient Stated Goal: to go home OT Goal Formulation: With patient/family Time For Goal Achievement: 04/15/24 Potential to Achieve Goals: Good   OT Frequency:       Co-evaluation              AM-PAC OT 6 Clicks Daily Activity     Outcome Measure Help from another person eating meals?: None Help from another person taking care of personal grooming?: A Little Help from another person toileting, which includes using toliet, bedpan, or urinal?: A Little Help from another person bathing (including washing, rinsing, drying)?: A Little Help from another person to put on and taking off regular upper body clothing?: None Help from another person to put on and taking off regular lower body clothing?: A Little 6 Click Score: 20   End of Session Equipment Utilized During Treatment: Gait belt Nurse Communication: Mobility status  Activity Tolerance: Patient tolerated treatment well Patient left: in bed;with call bell/phone within reach  OT Visit Diagnosis: Pain Pain - part of body:  (back)                Time: 1478-2956 OT  Time Calculation (min): 27 min Charges:  OT General Charges $OT Visit: 1 Visit OT Evaluation $OT Eval Low Complexity: 1 Low OT Treatments $Self Care/Home Management : 8-22 mins  04/01/2024  AB, OTR/L  Acute Rehabilitation Services  Office: [phone]   Jorene New 04/01/2024, 10:41 AM

## 2024-04-01 NOTE — Op Note (Signed)
 NAME: Grant [name], Grant [name] MEDICAL RECORD NO: [id number] ACCOUNT NO: [id number] DATE OF BIRTH: 1951-01-24 FACILITY: [facility] LOCATION: [facility] PHYSICIAN: Loel Ring, MD  Operative Report   DATE OF PROCEDURE: 03/31/2024  PREOPERATIVE DIAGNOSES:  Spinal stenosis, HNP, L3-L4, migrating cephalad.  POSTOPERATIVE DIAGNOSES: 1.  HNP L3-L4 migrating cephalad. 2.  Spinal stenosis L3-L4. 3.  Extensive epidural venous plexus.  PROCEDURES PERFORMED: 1.  A central laminotomy of L3-L4 with excision of the spinous process of L3. 2.  Hemilaminectomy L3 left. 3.  Microdiscectomy L3-L4 left. 4.  Lysis of extensive epidural venous plexus.  ANESTHESIA:  General.  ASSISTANT:  Jaclyn Bissell, PA.  HISTORY:  A 73 year old male with L3-L4 radicular pain, numbness, and weakness due to a large HNP migrating cephalad from L3-L4 to the left compressing the L3 and the L4 nerve roots.  He was indicated for a decompression due to neurologic deficits and  intractable pain.  Risks and benefits discussed including bleeding, infection, damage to the surrounding neurovascular structures, no change in symptoms, worsening symptoms, DVT, PE, anesthetic complications, etc.   The disc fragment had migrated cephalad to the point behind the vertebral body of L3 up to the pedicle of L3 requiring excision of the hemilamina of L3 to gain access to the disc.  DESCRIPTION OF PROCEDURE:  The patient was placed in the supine position.  After induction of adequate spinal anesthesia, 2 g of Kefzol .  Foley was placed to gravity.  The patient had some mild to moderate difficulty with inserting the Foley, likely due  to his BPH.  After full insertion through the resistance, there was noted some hematuria.  It passed freely after that, slowly. Following that, we slowly inflated the Foley balloon without resistance.  Pulled backwards and the Foley balloon captured.   The urine was draining, although there was hematuria.  He was placed prone  on the Wilson frame.  All bony prominences were well padded and the lumbar region was prepped and draped in the usual sterile fashion.  Two 18-gauge spinal needles were utilized  to localize the L3-L4 interspace confirmed with x-ray.  Incision was made from the spinous process of L2 to the spinous process of L4.  Subcutaneous tissue was dissected and electrocautery was utilized to achieve hemostasis.  Dorsal lumbar fascia divided  line in the skin incision.  Paraspinous muscles elevated from a lamina L3-L4 bilaterally and up to L2-L3.  The patient had a moderate amount of bleeding that was cauterized and controlled with controlling the patient's blood pressure.  After  confirmatory radiograph was obtained and a McCulloch retractor was placed, I excised the spinous process of L3.  Then I undercut the lamina of L3 with Leksell rongeur.  I identified the disc space at L2-L3 as well.  I then used a high-speed burr to  debulk the hemilamina of L3 on the left.  Then, I used a 3 mm Kerrison to perform a hemilaminotomy of the caudad edge of L3, to the point detaching the ligamentum flavum at L3-L4.  There was a large epidural vein beneath this.  A neural patty was placed  beneath this to protect the area.  I continued cephalad with the hemilaminotomy.  I then started at L2-L3 after detaching ligamentum flavum from the cephalad edge of L3 with a micro curette and then placed in the patty beneath the ligamentum.  I then  began a foraminotomy of L3 and completed the hemilaminectomy of L3 from above, protecting the neural elements beneath it.  After  removing those and decompressing the lateral recess of the medial border of the pedicle, extensive epidural venous plexus was  encountered.  Moderate bleeding was encountered.  We used a combination of thrombin -soaked Gelfoam neural patties as well as the bipolar cautery to achieve strict hemostasis.  There was a time that we used the neural patties in packing it for a period   of 5 minutes to assist in hemostasis.  Also, topical TXA was used as well for cautery.  The patient had been on Xarelto  5 days previous.  After optimizing the blood pressure and our hemostatic agents and her electrocautery, we were able to gain access to  the lateral aspect of the thecal sac, the L3 root which was identified as well.  I identified the disc space of L3-L4 and then behind the vertebral body of L3, there was a large herniated fragment that has migrated up to almost the disc space of L2-L3.   This was cauterized and then immobilized with a nerve hook.  Multiple fragments were then retrieved from beneath the thecal sac, the L3 nerve root out into the foramen of L3 and under the L4 root.  I then irrigated this pseudocapsule over the herniated  disc and swept with a Woodson.  There were no residual fragments noted and the [facility] passed free throughout the foramen of L3 and L4, examined at the disc space.  There was no herniation at the disc space.  The small rent of the annulus was noted at L3-4.   We controlled the bleeding.  I placed bone wax on the cancellous surfaces.  I obtained confirmatory radiographs with a marker at the disk space at L3-L4 and at the foramen of L3.  Used the operating microscope.  We were able to achieve hemostasis.  I  removed the thrombin -soaked Gelfoam and the TXA patties.  No evidence of CSF leakage or active bleeding.  We copiously irrigated the wound.  Thrombin -soaked Gelfoam was placed in the wound and then retrieved.  Again, no evidence of CSF leakage or active  bleeding.  I then removed the McCulloch retractor and meticulously achieved hemostasis with bipolar electrocautery.  I irrigated it.  I then placed a Hemovac and brought out through a lateral stab wound of the skin and then closed the dorsal lumbar  fascia with 1 Vicryl interrupted figure-of-eight suture, subcu with 2-0 and the skin with staples.  The wound was dressed sterilely and placed supine on the  hospital bed, extubated without difficulty, and transported to the recovery room in satisfactory  condition.    The patient tolerated the procedure well and no complications.  Assistant Jaclyn Bissell, [place] was used throughout the case for patient positioning, gentle intermittent neural traction and suction and closure.  BLOOD LOSS:  200 mL.  The patient had urine of 200 mL.  Again, hematuria noted.  The plan was to consult Urology.    They consulted Urology from the PACU.  They will see the patient in the morning.  Instructions were to keep the Foley in overnight, irrigate it if necessary.   [facility] D: 03/31/2024 5:29:57 pm T: 04/01/2024 12:52:00 am  JOB: [id number]/ [id number]

## 2024-04-01 NOTE — Evaluation (Addendum)
 Physical Therapy Evaluation Patient Details Name: Grant [name] MRN: [id number] DOB: 09/26/1951 Today's Date: 04/01/2024  History of Present Illness  Pt is a 73 yo male with herniated nucleus pulposus L3-L4 presenting to [facility] on 03/31/24 for elective back sx. S/p L3-L4 Laminectomy and Discectomy. PMH of arhtritis, anxiety, Dyspnea, fibromyalgia, GERD, hypothyroidism, TAVR (2023), cva with residual chorea.  Clinical Impression   Pt presents with min LE weakness, impaired balance, and decreased activity tolerance vs baseline. Pt ambulated good hallway distance, proficiently navigated stairs demonstrating ability to enter home at d/c. PT reviewed BLT rules s/p laminectomy, pt and wife express understanding. PT anticipates good functional recovery at home. All PT education completed, pt with no further acute or post-acute PT needs at this time.         If plan is discharge home, recommend the following: A little help with walking and/or transfers   Can travel by private vehicle        Equipment Recommendations Rolling walker (2 wheels) (for use PRN acutely s/p back surgery)  Recommendations for Other Services       Functional Status Assessment Patient has not had a recent decline in their functional status     Precautions / Restrictions Precautions Precautions: Fall;Back Precaution Booklet Issued: Yes (comment) Recall of Precautions/Restrictions: Intact Precaution/Restrictions Comments: reviewed BLT rules Weight Bearing Restrictions Per Provider Order: No      Mobility  Bed Mobility Overal bed mobility: Needs Assistance Bed Mobility: Rolling, Sidelying to Sit Rolling: Supervision Sidelying to sit: Supervision       General bed mobility comments: cues for sequencing and technique    Transfers Overall transfer level: Needs assistance Equipment used: None Transfers: Sit to/from Stand Sit to Stand: Supervision           General transfer comment: slow to rise, cues  for sequencing    Ambulation/Gait Ambulation/Gait assistance: Supervision Gait Distance (Feet): 400 Feet Assistive device: None Gait Pattern/deviations: Step-through pattern, Decreased stride length, Drifts right/left Gait velocity: decr     General Gait Details: for safety, min drift L/R but no overt unsteadiness even without AD. cues for posture  Stairs Stairs: Yes Stairs assistance: Contact guard assist Stair Management: One rail Left, Step to pattern, Forwards Number of Stairs: 3 General stair comments: cues for taking his time, step-to pattern, and use of rail for safety  Wheelchair Mobility     Tilt Bed    Modified Rankin (Stroke Patients Only)       Balance Overall balance assessment: Needs assistance Sitting-balance support: No upper extremity supported, Feet supported Sitting balance-Leahy Scale: Fair     Standing balance support: No upper extremity supported, During functional activity Standing balance-Leahy Scale: Fair                               Pertinent Vitals/Pain Pain Assessment Pain Assessment: 0-10 Pain Score: 7  Pain Location: back Pain Descriptors / Indicators: Discomfort, Sore Pain Intervention(s): Limited activity within patient's tolerance, Monitored during session, Repositioned    Home Living Family/patient expects to be discharged to:: Private residence Living Arrangements: Spouse/significant other Available Help at Discharge: Family Type of Home: House Home Access: Stairs to enter Entrance Stairs-Rails: Right;Left;Can reach both Secretary/administrator of Steps: 3   Home Layout: One level (sunken den) Home Equipment: Jeananne Mighty - single point      Prior Function Prior Level of Function : Independent/Modified Independent  Mobility Comments: endorsing indep ADLs Comments: assist for LB dressing, indep with showering     Extremity/Trunk Assessment   Upper Extremity Assessment Upper Extremity  Assessment: Defer to OT evaluation    Lower Extremity Assessment Lower Extremity Assessment: Generalized weakness (history of radicular symptoms L>R, endorsing bilat foot numbness and coldness intermittently)    Cervical / Trunk Assessment Cervical / Trunk Assessment: Back Surgery  Communication        Cognition Arousal: Alert Behavior During Therapy: WFL for tasks assessed/performed   PT - Cognitive impairments: No apparent impairments                                 Cueing       General Comments  Home walking program: up and walking 1x/hour during waking hours for short household distances with supervision of family, to promote circulation, activity tolerance, and strength maintenance.      Exercises     Assessment/Plan    PT Assessment Patient does not need any further PT services  PT Problem List         PT Treatment Interventions      PT Goals (Current goals can be found in the Care Plan section)  Acute Rehab PT Goals PT Goal Formulation: All assessment and education complete, DC therapy Time For Goal Achievement: 04/01/24 Potential to Achieve Goals: Good    Frequency       Co-evaluation               AM-PAC PT 6 Clicks Mobility  Outcome Measure Help needed turning from your back to your side while in a flat bed without using bedrails?: None Help needed moving from lying on your back to sitting on the side of a flat bed without using bedrails?: None Help needed moving to and from a bed to a chair (including a wheelchair)?: None Help needed standing up from a chair using your arms (e.g., wheelchair or bedside chair)?: None Help needed to walk in hospital room?: A Little Help needed climbing 3-5 steps with a railing? : A Little 6 Click Score: 22    End of Session   Activity Tolerance: Patient tolerated treatment well Patient left: in bed;with call bell/phone within reach;with family/visitor present;with nursing/sitter in  room Nurse Communication: Mobility status PT Visit Diagnosis: Other abnormalities of gait and mobility (R26.89)    Time: 4098-1191 PT Time Calculation (min) (ACUTE ONLY): 20 min   Charges:   PT Evaluation $PT Eval Low Complexity: 1 Low   PT General Charges $$ ACUTE PT VISIT: 1 Visit        Shirlene Doughty, PT DPT Acute Rehabilitation Services Secure Chat Preferred  Office [phone]   Lourdes Manning E Burnadette Carrion 04/01/2024, 10:26 AM

## 2024-04-07 ENCOUNTER — Encounter: Payer: Self-pay | Admitting: Cardiovascular Disease

## 2024-04-13 NOTE — Discharge Summary (Signed)
 Physician Discharge Summary   Patient ID: Grant [name] MRN: [id number] DOB/AGE: 03-27-1951 73 y.o.  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 Fib   Enlarged prostate    Fibromyalgia    GERD (gastroesophageal reflux disease)    History of chronic bronchitis    History of migraine    light and sound sensitive   History of toxic encephalopathy    Hypothyroidism    OSA on CPAP    CPAP 14   PAF (paroxysmal atrial fibrillation) (HCC) CARDIOLOGIST -- DR DELFORD   DX OCT 2013   Pneumonia 2023   S/P AVR (aortic valve replacement) and aortoplasty    Stroke [facility])    has post stroke chorea   Unspecified essential hypertension    Urethral tumor    PROSTATIC   Discharge Diagnoses:   Principal Problem:   HNP (herniated nucleus pulposus), lumbar  Procedure:  Procedure(s) (LRB): DECOMPRESSIVE LUMBAR LAMINECTOMY LEVEL 1 (N/A)   Consults: None  HPI:  See H&P    Laboratory Data: Hospital Outpatient Visit on 03/25/2024  Component Date Value Ref Range Status   Sodium 03/25/2024 138  135 - 145 mmol/L Final   Potassium 03/25/2024 4.1  3.5 - 5.1 mmol/L Final   Chloride 03/25/2024 102  98 - 111 mmol/L Final   CO2 03/25/2024 29  22 - 32 mmol/L Final   Glucose, Bld 03/25/2024 101 (H)  70 - 99 mg/dL Final   Glucose reference range applies only to samples taken after fasting for at least 8 hours.   BUN 03/25/2024 16  8 - 23 mg/dL Final   Creatinine, Ser 03/25/2024 1.10  0.61 - 1.24 mg/dL Final   Calcium  03/25/2024 9.1  8.9 - 10.3 mg/dL Final   GFR,  Estimated 03/25/2024 >60  >60 mL/min Final   Comment: (NOTE) Calculated using the CKD-EPI Creatinine Equation (2021)    Anion gap 03/25/2024 7  5 - 15 Final   Performed at [facility], [address], [place], [place] [address]   WBC 03/25/2024 12.7 (H)  4.0 - 10.5 K/uL Final   RBC 03/25/2024 4.82  4.22 - 5.81 MIL/uL Final   Hemoglobin 03/25/2024 15.4  13.0 - 17.0 g/dL Final   HCT 93/93/7974 46.6  39.0 - 52.0 % Final   MCV 03/25/2024 96.7  80.0 - 100.0 fL Final   MCH 03/25/2024 32.0  26.0 - 34.0 pg Final   MCHC 03/25/2024 33.0  30.0 - 36.0 g/dL Final   RDW 93/93/7974 13.9  11.5 - 15.5 % Final   Platelets 03/25/2024 157  150 - 400 K/uL Final   nRBC 03/25/2024 0.0  0.0 - 0.2 % Final   Performed at [facility], [address], [place], [place] [address]   MRSA, PCR 03/25/2024 NEGATIVE  NEGATIVE Final   Staphylococcus aureus 03/25/2024 NEGATIVE  NEGATIVE Final   Comment: (NOTE) The Xpert SA Assay (FDA approved for NASAL specimens in patients 66 years of age and older),  is one component of a comprehensive surveillance program. It is not intended to diagnose infection nor to guide or monitor treatment. Performed at [facility], [address], [place], [place] [address]    No results for input(s): HGB in the last 72 hours. No results for input(s): WBC, RBC, HCT, PLT in the last 72 hours. No results for input(s): NA, K, CL, CO2, BUN, CREATININE, GLUCOSE, CALCIUM  in the last 72 hours. No results for input(s): LABPT, INR in the last 72 hours.  X-Rays:DG Lumbar Spine 2-3 Views Result Date: 03/31/2024 CLINICAL DATA:  Surgical localization. EXAM: LUMBAR SPINE - 2-3 VIEW COMPARISON:  March 25, 2024. FINDINGS: Three intraoperative cross-table lateral projections were obtained of the lumbar spine. The first image demonstrates surgical probes at the approximately L4 and S1 levels. The second image demonstrates surgical probes at approximately the  L2-3 and L3-4 levels. The final image demonstrates surgical probes at the L3 and L3-4 levels. IMPRESSION: Surgical localization as described above. Electronically Signed   By: Lynwood Landy Raddle M.D.   On: 03/31/2024 16:36   DG Lumbar Spine 2-3 Views Result Date: 03/25/2024 CLINICAL DATA:  Herniated nucleus pulposis, lumbar. EXAM: LUMBAR SPINE - 2-3 VIEW COMPARISON:  None Available. FINDINGS: Examination performed standing. Five non-rib-bearing lumbar vertebra. Vertebral bodies were numbered. Trace anterolisthesis of L3 on L4 and L4 on L5. Anterior spurring throughout with mild L3-L4 disc space narrowing. Moderate lower lumbar facet hypertrophy. Mild chronic flattening of lower thoracic vertebra. IMPRESSION: 1. Mild multilevel degenerative disc disease and moderate lower lumbar facet hypertrophy. 2. Trace anterolisthesis of L3 on L4 and L4 on L5. Electronically Signed   By: Andrea Gasman M.D.   On: 03/25/2024 15:08    EKG: Orders placed or performed in visit on 03/21/24   EKG 12-Lead   *Note: Due to a large number of results and/or encounters for the requested time period, some results have not been displayed. A complete set of results can be found in Results Review.     Hospital Course: Patient was admitted to [facility] and taken to the OR and underwent the above state procedure without complications.  Patient tolerated the procedure well and was later transferred to the recovery room and then to the orthopaedic floor for postoperative care.  They were given PO and IV analgesics for pain control following their surgery.  They were given 24 hours of postoperative antibiotics.   PT was consulted postop to assist with mobility and transfers.  The patient was allowed to be WBAT with therapy and was taught back precautions. Discharge planning was consulted to help with postop disposition and equipment needs.  Patient had a good night on the evening of surgery and started to get up OOB with therapy  on day one. Patient was seen in rounds and was ready to go home on day one.  They were given discharge instructions and dressing directions.  They were instructed on when to follow up in the office with Dr. Duwayne.   Diet: Regular diet Activity:WBAT Follow-up:in 10-14 days Disposition - Home Discharged Condition: good   Discharge Instructions     Call MD / Call 911   Complete by: As directed    If you experience chest pain or shortness of breath, CALL 911 and be transported to the hospital emergency room.  If you develope a fever above 101 F, pus (white drainage) or increased drainage or redness at the wound, or calf pain, call your surgeon's office.  Constipation Prevention   Complete by: As directed    Drink plenty of fluids.  Prune juice may be helpful.  You may use a stool softener, such as Colace (over the counter) 100 mg twice a day.  Use MiraLax  (over the counter) for constipation as needed.   Diet - low sodium heart healthy   Complete by: As directed    Incentive spirometry RT   Complete by: As directed    Increase activity slowly as tolerated   Complete by: As directed    Post-operative opioid taper instructions:   Complete by: As directed    POST-OPERATIVE OPIOID TAPER INSTRUCTIONS: It is important to wean off of your opioid medication as soon as possible. If you do not need pain medication after your surgery it is ok to stop day one. Opioids include: Codeine, Hydrocodone (Norco, Vicodin), Oxycodone (Percocet, oxycontin ) and hydromorphone  amongst others.  Long term and even short term use of opiods can cause: Increased pain response Dependence Constipation Depression Respiratory depression And more.  Withdrawal symptoms can include Flu like symptoms Nausea, vomiting And more Techniques to manage these symptoms Hydrate well Eat regular healthy meals Stay active Use relaxation techniques(deep breathing, meditating, yoga) Do Not substitute Alcohol to help with  tapering If you have been on opioids for less than two weeks and do not have pain than it is ok to stop all together.  Plan to wean off of opioids This plan should start within one week post op of your joint replacement. Maintain the same interval or time between taking each dose and first decrease the dose.  Cut the total daily intake of opioids by one tablet each day Next start to increase the time between doses. The last dose that should be eliminated is the evening dose.         Allergies as of 04/01/2024       Reactions   Ingrezza  [valbenazine  Tosylate] Other (See Comments)   PVC's   Perphenazine Swelling, Other (See Comments)   Tongue swelling   Amantadines    Feels bad   Amiodarone  Swelling, Other (See Comments)   Tongue swelling   Codeine Itching   Levaquin [levofloxacin] Other (See Comments)   malaise   Prednisone Itching, Other (See Comments)   CAN TOLERATE DEPO. PREDNISONE BY MOUTH CAUSES MOOD CHANGES, INSOMNIA, AND ITCHING.    Tramadol  Other (See Comments)   Delusions/hallucinations from high doses        Medication List     STOP taking these medications    multivitamin with minerals Tabs tablet   naproxen sodium 220 MG tablet Commonly known as: ALEVE   Xarelto  20 MG Tabs tablet Generic drug: rivaroxaban        TAKE these medications    acetaminophen  500 MG tablet Commonly known as: TYLENOL  Take 1,000 mg by mouth every 8 (eight) hours as needed for headache or moderate pain.   Aimovig  140 MG/ML Soaj Generic drug: Erenumab -aooe Inject 140 mg into the skin every 28 (twenty-eight) days.   albuterol  108 (90 Base) MCG/ACT inhaler Commonly known as: Ventolin  HFA INHALE 2 PUFFS INTO THE LUNGS EVERY 6 HOURS AS NEEDED FOR WHEEZING OR SHORTNESS OF BREATH   ALPRAZolam  0.25 MG tablet Commonly known as: XANAX  Take 1 tablet (0.25 mg total) by mouth 3 (three) times daily as needed for anxiety.   aluminum hydroxide-magnesium  carbonate 95-358 MG/15ML  Susp Commonly known as: GAVISCON Take 15 mLs by mouth 3 (three) times daily as needed for indigestion or heartburn.   azelastine   0.1 % nasal spray Commonly known as: ASTELIN  Place 1 spray into the nose 2 (two) times daily as needed for rhinitis or allergies.   benzonatate  200 MG capsule Commonly known as: TESSALON  Take 1 capsule (200 mg total) by mouth 3 (three) times daily as needed for cough.   BLUE-EMU MAXIMUM STRENGTH EX Apply 1 application topically 3 (three) times daily as needed (arthritis pain.).   budesonide -formoterol  160-4.5 MCG/ACT inhaler Commonly known as: Symbicort  Inhale 2 puffs into the lungs 2 (two) times daily. in the morning and at bedtime.   cetirizine 10 MG tablet Commonly known as: ZYRTEC Take 10 mg by mouth every morning.   Colcrys  0.6 MG tablet Generic drug: colchicine  Take 0.6 mg by mouth daily.   Delsym  30 MG/5ML liquid Generic drug: dextromethorphan  Take 30 mg by mouth daily as needed for cough.   diclofenac  Sodium 1 % Gel Commonly known as: VOLTAREN  Apply 2 g topically 4 (four) times daily as needed (to affected areas- for arthritis pain.).   diphenhydrAMINE  25 mg capsule Commonly known as: BENADRYL  Take 50 mg by mouth at bedtime as needed for allergies.   docusate sodium  100 MG capsule Commonly known as: Colace Take 1 capsule (100 mg total) by mouth 2 (two) times daily.   fexofenadine 180 MG tablet Commonly known as: ALLEGRA Take 180 mg by mouth daily.   finasteride  5 MG tablet Commonly known as: PROSCAR  Take 5 mg by mouth at bedtime.   furosemide  40 MG tablet Commonly known as: LASIX  TAKE 2 TABLETS(80 MG) BY MOUTH DAILY   hydrocortisone  5 MG tablet Commonly known as: CORTEF  1-3 tabs daily for bronchitis   HYDROmorphone  2 MG tablet Commonly known as: DILAUDID  Take 1 tablet (2 mg total) by mouth every 4 (four) hours as needed for severe pain (pain score 7-10).   ipratropium 0.06 % nasal spray Commonly known as:  ATROVENT  Place 2 sprays into both nostrils 2 (two) times daily.   Iron 142 (45 Fe) MG Tbcr Take 142 mg by mouth in the morning and at bedtime.   levothyroxine  112 MCG tablet Commonly known as: SYNTHROID  Take 112 mcg by mouth daily before breakfast.   liothyronine  25 MCG tablet Commonly known as: CYTOMEL  Take 25 mcg by mouth daily before breakfast.   losartan  50 MG tablet Commonly known as: COZAAR  TAKE 1 TABLET BY MOUTH EVERY DAY   methocarbamol  750 MG tablet Commonly known as: ROBAXIN  Take 750 mg by mouth 3 (three) times daily.   metoprolol  tartrate 50 MG tablet Commonly known as: LOPRESSOR  TAKE 1 TABLET(50 MG) BY MOUTH TWICE DAILY   Mucinex  Maximum Strength 1200 MG Tb12 Generic drug: Guaifenesin  Take 1,200 mg by mouth 2 (two) times daily as needed (cough).   olopatadine  0.1 % ophthalmic solution Commonly known as: PATANOL Place 1 drop into both eyes 2 (two) times daily as needed for allergies.   omeprazole  40 MG capsule Commonly known as: PRILOSEC Take 1 capsule (40 mg total) by mouth 2 (two) times daily.   oxyCODONE -acetaminophen  10-325 MG tablet Commonly known as: PERCOCET Take 1 tablet by mouth every 6 (six) hours as needed for pain.   polyethylene glycol 17 g packet Commonly known as: MIRALAX  / GLYCOLAX  Take 17 g by mouth daily.   PRESCRIPTION MEDICATION See admin instructions. CPAP- At bedtime   rizatriptan  10 MG tablet Commonly known as: MAXALT  TAKE 1 TABLET BY MOUTH AS NEEDED FOR MIGRAINE. MAY REPEAT IN 2 HOURS IF NEEDED   rOPINIRole  0.25 MG tablet Commonly known as:  REQUIP  Take 1 tablet (0.25 mg total) by mouth 3 (three) times daily. What changed: when to take this   saccharomyces boulardii 250 MG capsule Commonly known as: FLORASTOR Take 250 mg by mouth every morning.   sodium chloride  0.65 % Soln nasal spray Commonly known as: OCEAN Place 1 spray into both nostrils 2 (two) times daily.   Testosterone  Cypionate 200 MG/ML Soln Inject 200 mg  into the muscle See admin instructions. Inject 200 mg (1 ml) intramuscularly once every 7-10 days   topiramate  25 MG tablet Commonly known as: TOPAMAX  Take 1 tablet every night   venlafaxine  XR 75 MG 24 hr capsule Commonly known as: EFFEXOR -XR TAKE 1 CAPSULE(75 MG) BY MOUTH DAILY WITH BREAKFAST   zolpidem  10 MG tablet Commonly known as: AMBIEN  Take 10 mg by mouth at bedtime as needed for sleep.         Signed: Arlo Butt, PA-C Orthopaedic Surgery 04/13/2024, 9:23 AM

## 2024-04-15 ENCOUNTER — Encounter: Payer: Self-pay | Admitting: Neurology

## 2024-04-15 ENCOUNTER — Telehealth (INDEPENDENT_AMBULATORY_CARE_PROVIDER_SITE_OTHER): Payer: Medicare Other | Admitting: Neurology

## 2024-04-15 VITALS — Ht 70.0 in | Wt 224.0 lb

## 2024-04-15 DIAGNOSIS — G255 Other chorea: Secondary | ICD-10-CM

## 2024-04-15 DIAGNOSIS — G43109 Migraine with aura, not intractable, without status migrainosus: Secondary | ICD-10-CM

## 2024-04-15 NOTE — Patient Instructions (Signed)
 Good to see you feeling better with the migraines! Continue current medications. Follow-up in 6 months, call for any changes.

## 2024-04-15 NOTE — Progress Notes (Signed)
 Virtual Visit via Video Note The purpose of this virtual visit is to provide medical care while limiting exposure to the novel coronavirus.    Consent was obtained for video visit:  Yes.   Answered questions that patient had about telehealth interaction:  Yes.   I discussed the limitations, risks, security and privacy concerns of performing an evaluation and management service by telemedicine. I also discussed with the patient that there may be a patient responsible charge related to this service. The patient expressed understanding and agreed to proceed.  Pt location: Home Physician Location: office Name of referring provider:  Arloa Elsie SAUNDERS, MD I connected with Grant [name] at patients initiation/request on 04/15/2024 at 11:30 AM EDT by video enabled telemedicine application and verified that I am speaking with the correct person using two identifiers. Pt MRN:  [id number] Pt DOB:  Jun 01, 1951 Video Participants:  Grant PARAS Schoen;  [name] Joy (spouse)   History of Present Illness:  The patient had a virtual video visit on 04/15/2024. 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 His wife is present to provide additional information. On his last visit, he was reporting an increase in migraines with visual aura. Topiramate  25mg  at bedtime was added to Aimovig  for migraine prophylaxis, and they both report migraines were getting less and he was doing really well with addition of Topiramate . He was having migraines maybe every 2 weeks and hardly needed the Rizatriptan . He is not having to wear his sunglasses indoors. He started having significant back pain after bending down to tie his shoe and was found to have a herniated disc. He underwent decompressive lumbar laminectomy on 6/12 and reports the surgery went very well. He is not in a lot of pain. He was having tingling in his legs which are a lot better after  surgery, but when he injured his back, he picked up some numb spots feeling like there is cold water  pouring on his right leg. He is on pain medication for his back, [name] reports he took medication earlier today and is getting off and kind of on [place] a little bit from the medication. He did well immediately post-op then the chronic fatigue got to him. He has a follow-up in 4 weeks with plans for PT. No falls. He has similar mouth movements noted on video today. He sleeps 8-9 hours with Ambien .    History on Initial Assessment 03/02/2019: This is a 73 year old right-handed man with a history of OSA, atrial fibrillation on chronic anticoagulation, aortic valve replacement, depression, chronic fatigue syndrome/fibromyalgia, presenting after hospital admission last 02/16/2019 for altered mental status. He was in his usual state of health (a little unusual all the time anyway), working in his shop when he stopped doing one thing and could not remember what he was going to do next. As this was going on, his wife came to the shop and he does not remember much until they were riding in the car to the hospital. His wife reports that he has had baseline confusion with his chronic fatigue syndrome for more than 15 years, worse when he is tired, but this day was unusual because he was looking all over the place not knowing what he was doing, and he kept asking her the same thing repeatedly. He would usually argue when he goes to the doctor, but this time did not argue, he was not as cognizant  of how different/serious it was while he was in the hospital. His wife reported that he had been bouncing off the edge of a migraine daily for 2-3 weeks prior. He was taking a lot of Tylenol , Aleve, and Maxalt . It appears he was back to baseline in the ER. Bloodwork was normal, UDS and EtOH level negative. I personally reviewed MRI/MRA brain which did not show any acute changes. There was mild chronic microvascular disease. There  was scattered small vessel atherosclerotic change without significant stenosis. There was an incidental finding of a 3mm focal outpouching at the left MCA bifurcation, suspicious for aneurysm. His wake and drowsy EEG was normal. Symptoms felt due to transient global amnesia or migraine. No further recurrence of similar significant confusion.  They report a complicated history since his early 71s which has been diagnosed as chronic fatigue syndrome (CFS). He takes Effexor . His wife states it has been more than 15 years where he has been confused and kind of forgetful. 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 In general he occasionally spaces out, but no episodes of unresponsiveness. He would have days where he speaks and thinks better, figuring out things faster, usually when he has an adrenaline burst, then would crash for a couple of days. If he gets overstimulated or surprised by a loud sound or too much information, he would start going somewhere else and get confused. He got lost in a store one time. It can last 1-2 hours or a whole day before he is back to his usual self. 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 He has had migraines since childhood, usually with stabbing pain in the left temporal or retroorbital regions. If he does not stop them, they may go on all day. He would have tunnel vision and see black shimmering objects, with nausea and light sensitivity, tunnel vision. His left eye would almost shut. He has been taking Maxalt  every other day for the past few weeks. He has never been on a preventative medication. He took perphenazine in the past but it caused tongue swelling. He took it a week or so ago and his tongue swelled up again. They report balance issues for the past 1.5 years, he would have extreme fatigue where he feels like he would pass out, occasional vertical diplopia. He was in the hospital while  visiting the beach in 08/2018. They fell since his cardiac surgery in 2018, more funny things have been going on since then. He does not drive. His mother and maternal grandmother had migraines. He denies any focal numbness/tingling/weakness, dysphagia. Sleep is good with CPAP machine.   Update 12/19/2022: Since his last visit with me 4 months ago, he has kindly been seen by our Movement Disorders specialist Dr. Evonnie who did a careful review of history of imaging. 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 He had a repeat brain MRI with and without contrast done 11/2022 with no acute changes, unchanged small remote infarct in the left caudate nucleus. Huntington disease panel was negative. On further review of imaging dating back to 2020, there were no caudate changes seen in 01/2019 and 12/2020. MRI in 12/2020 was ordered for these symptoms. On brain MRI in 10/2021 and moving forward (04/2022, 08/2022, 11/2022),  the small remote infarct in the left caudate head is noted. After discussion with Dr. 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 Recommendations included a trial of Amantadine , and if symptoms still uncontrolled, Xenazine/tetrabenazine/Ingrezza  are still possibilities. Benzodiazepine is not ideal given his history of alcohol use. He did try the Ingrezza  in January, but the day after he felt bad with low heart rate, cold sweats, then the day after he had chest pressure and extra beats with HR of 59. He reports he felt erratic heart beat, chest pain. She did not give the dose over the weekend then he had another episode Sunday.    Diagnostic Data: MRI brain without contrast in 12/2020 did not show any acute changes, there was mild chronic microvascular disease in the right paracentral pons, stable chronic microhemorrhage in the left frontal  lobe.  Brain MRI in 10/2021 and moving forward (04/2022, 08/2022, 11/2022), showed small remote infarct in the left caudate head Wake and sleep EEG in 12/2020 was normal  Bloodwork for CK, aldolase, ceruloplasmin, copper , PTH, ferritin, sedimentation rate, ANA, antiphospholipid antibody, lupus anticoagulant, RPR normal in 06/2021   Current Outpatient Medications on File Prior to Visit  Medication Sig Dispense Refill   acetaminophen  (TYLENOL ) 500 MG tablet Take 1,000 mg by mouth every 8 (eight) hours as needed for headache or moderate pain.     albuterol  (VENTOLIN  HFA) 108 (90 Base) MCG/ACT inhaler INHALE 2 PUFFS INTO THE LUNGS EVERY 6 HOURS AS NEEDED FOR WHEEZING OR SHORTNESS OF BREATH 18 g 12   ALPRAZolam  (XANAX ) 0.25 MG tablet Take 1 tablet (0.25 mg total) by mouth 3 (three) times daily as needed for anxiety. 15 tablet 0   aluminum hydroxide-magnesium  carbonate (GAVISCON) 95-358 MG/15ML SUSP Take 15 mLs by mouth 3 (three) times daily as needed for indigestion or heartburn.     benzonatate  (TESSALON ) 200 MG capsule Take 1 capsule (200 mg total) by mouth 3 (three) times daily as needed for cough. 50 capsule 3   budesonide -formoterol  (SYMBICORT ) 160-4.5 MCG/ACT inhaler Inhale 2 puffs into the lungs 2 (two) times daily. in the morning and at bedtime. 10.2 g 12   cetirizine (ZYRTEC) 10 MG tablet Take 10 mg by mouth every morning.     colchicine  (COLCRYS ) 0.6 MG tablet Take 0.6 mg by mouth daily.     dextromethorphan  (DELSYM ) 30 MG/5ML liquid Take 30 mg by mouth daily as needed for cough.     diclofenac  Sodium (VOLTAREN ) 1 % GEL Apply 2 g topically 4 (four) times daily as needed (to affected areas- for arthritis pain.).     diphenhydrAMINE  (BENADRYL ) 25 mg capsule Take 50 mg by mouth at bedtime as needed for allergies.     docusate sodium  (COLACE) 100 MG capsule Take 1 capsule (100 mg total) by mouth 2 (two) times daily. 60 capsule 2   Erenumab -aooe (AIMOVIG ) 140 MG/ML SOAJ Inject 140 mg into the skin  every 28 (twenty-eight) days. 1 mL 11   Ferrous Sulfate  (IRON) 142 (45 Fe) MG TBCR Take 142 mg by mouth in the morning and at bedtime.     fexofenadine (ALLEGRA) 180 MG tablet Take 180 mg by mouth daily.     finasteride  (PROSCAR ) 5 MG tablet Take 5 mg by mouth at bedtime.      furosemide  (LASIX ) 40 MG tablet TAKE 2 TABLETS(80 MG) BY MOUTH DAILY 180 tablet 3   Guaifenesin  (MUCINEX  MAXIMUM STRENGTH) 1200 MG TB12 Take 1,200 mg by mouth 2 (two)  times daily as needed (cough).     hydrocortisone  (CORTEF ) 5 MG tablet 1-3 tabs daily for bronchitis 90 tablet 5   HYDROmorphone  (DILAUDID ) 2 MG tablet Take 1 tablet (2 mg total) by mouth every 4 (four) hours as needed for severe pain (pain score 7-10). 30 tablet 0   ipratropium (ATROVENT ) 0.06 % nasal spray Place 2 sprays into both nostrils 2 (two) times daily.     levothyroxine  (SYNTHROID , LEVOTHROID) 112 MCG tablet Take 112 mcg by mouth daily before breakfast.      liothyronine  (CYTOMEL ) 25 MCG tablet Take 25 mcg by mouth daily before breakfast.     losartan  (COZAAR ) 50 MG tablet TAKE 1 TABLET BY MOUTH EVERY DAY 30 tablet 6   Menthol , Topical Analgesic, (BLUE-EMU MAXIMUM STRENGTH EX) Apply 1 application topically 3 (three) times daily as needed (arthritis pain.).     methocarbamol  (ROBAXIN ) 750 MG tablet Take 750 mg by mouth 3 (three) times daily.     metoprolol  tartrate (LOPRESSOR ) 50 MG tablet TAKE 1 TABLET(50 MG) BY MOUTH TWICE DAILY 180 tablet 3   olopatadine  (PATANOL) 0.1 % ophthalmic solution Place 1 drop into both eyes 2 (two) times daily as needed for allergies.     omeprazole  (PRILOSEC) 40 MG capsule Take 1 capsule (40 mg total) by mouth 2 (two) times daily. 180 capsule 1   polyethylene glycol (MIRALAX  / GLYCOLAX ) 17 g packet Take 17 g by mouth daily. 14 each 0   PRESCRIPTION MEDICATION See admin instructions. CPAP- At bedtime     rizatriptan  (MAXALT ) 10 MG tablet TAKE 1 TABLET BY MOUTH AS NEEDED FOR MIGRAINE. MAY REPEAT IN 2 HOURS IF NEEDED 30  tablet 3   rOPINIRole  (REQUIP ) 0.25 MG tablet Take 1 tablet (0.25 mg total) by mouth 3 (three) times daily. (Patient taking differently: Take 0.25 mg by mouth 2 (two) times daily.) 90 tablet 12   saccharomyces boulardii (FLORASTOR) 250 MG capsule Take 250 mg by mouth every morning.     sodium chloride  (OCEAN) 0.65 % SOLN nasal spray Place 1 spray into both nostrils 2 (two) times daily.     Testosterone  Cypionate 200 MG/ML SOLN Inject 200 mg into the muscle See admin instructions. Inject 200 mg (1 ml) intramuscularly once every 7-10 days     topiramate  (TOPAMAX ) 25 MG tablet Take 1 tablet every night 30 tablet 11   venlafaxine  XR (EFFEXOR -XR) 75 MG 24 hr capsule TAKE 1 CAPSULE(75 MG) BY MOUTH DAILY WITH BREAKFAST 60 capsule 0   zolpidem  (AMBIEN ) 10 MG tablet Take 10 mg by mouth at bedtime as needed for sleep.     azelastine  (ASTELIN ) 137 MCG/SPRAY nasal spray Place 1 spray into the nose 2 (two) times daily as needed for rhinitis or allergies. (Patient not taking: Reported on 04/15/2024)     oxyCODONE -acetaminophen  (PERCOCET) 10-325 MG tablet Take 1 tablet by mouth every 6 (six) hours as needed for pain.     Current Facility-Administered Medications on File Prior to Visit  Medication Dose Route Frequency Provider Last Rate Last Admin   methylPREDNISolone  acetate (DEPO-MEDROL ) injection (RADIOLOGY ONLY) 120 mg  120 mg Intramuscular Once Meier, Nathaniel M, MD         Observations/Objective:   Vitals:   04/15/24 1046  Weight: 224 lb (101.6 kg)  Height: 5' 10 (1.778 m)   GEN:  The patient appears stated age and is in NAD. He has similar orobuccolingual dyskinesias as on prior visits. Neurological examination: Patient is awake, alert. No aphasia or dysarthria.  Intact fluency and comprehension. Cranial nerves: Extraocular movements intact. No facial asymmetry. Motor: moves all extremities symmetrically, at least anti-gravity x 4.    Assessment and Plan:   This is a 73 yo RH man with a history of  OSA, atrial fibrillation on chronic anticoagulation, aortic valve replacement, depression, chronic fatigue syndrome/fibromyalgia, who had an episode of confusion last April 2020 and December 2020. MRI brain and EEG unremarkable, working diagnosis of complicated migraine. He has been on Aimovig  since 2021. He was having an increase in migraines on last visit with good response to addition of Topiramate  25mg  at bedtime to Aimovig . He has prn rizatriptan  for migraine rescue. Continue current regimen. He continues to have chorea but it is not very bothersome for him. Follow-up in 6 months, call for any changes.    Follow Up Instructions:    -I discussed the assessment and treatment plan with the patient. The patient was provided an opportunity to ask questions and all were answered. The patient agreed with the plan and demonstrated an understanding of the instructions.   The patient was advised to call back or seek an in-person evaluation if the symptoms worsen or if the condition fails to improve as anticipated.     Darice CHRISTELLA Shivers, MD

## 2024-04-18 DIAGNOSIS — M5127 Other intervertebral disc displacement, lumbosacral region: Secondary | ICD-10-CM | POA: Diagnosis not present

## 2024-04-18 DIAGNOSIS — Z9889 Other specified postprocedural states: Secondary | ICD-10-CM | POA: Diagnosis not present

## 2024-04-18 DIAGNOSIS — M25511 Pain in right shoulder: Secondary | ICD-10-CM | POA: Diagnosis not present

## 2024-04-18 DIAGNOSIS — M25512 Pain in left shoulder: Secondary | ICD-10-CM | POA: Diagnosis not present

## 2024-04-25 DIAGNOSIS — G4733 Obstructive sleep apnea (adult) (pediatric): Secondary | ICD-10-CM | POA: Diagnosis not present

## 2024-04-26 DIAGNOSIS — R350 Frequency of micturition: Secondary | ICD-10-CM | POA: Diagnosis not present

## 2024-04-26 DIAGNOSIS — R351 Nocturia: Secondary | ICD-10-CM | POA: Diagnosis not present

## 2024-04-26 DIAGNOSIS — N401 Enlarged prostate with lower urinary tract symptoms: Secondary | ICD-10-CM | POA: Diagnosis not present

## 2024-04-30 ENCOUNTER — Other Ambulatory Visit: Payer: Self-pay | Admitting: Neurology

## 2024-04-30 NOTE — Progress Notes (Unsigned)
 History of Present Illness: . male former smoker with asthma/chronic bronchitis and allergic rhinitis, OSA PAF/ anticoagulation, aortic valve replacement, toxic encephalopathy, light sensitivity, chronic fatigue/fibromyalgia NPSG 03/24/03- AHI 88/ hr, desaturation to 75%, body weight 244 lbs PFT 06/01/17-WNL-FVC 4.46/96%, FEV1 3.71/106%, ratio 0.83, FEF 25-75% 4.64/167%, no response to dilator, TLC 107%, DLCO 101%  PFT 07/07/22- ------------------------------------------------------------------   07/28/23- 73 year old male former smoker (13.5 pkyrs) followed for Asthma/chronic Bronchitis and allergic Rhinitis,  OSA, Lung Nodule,  complicated by PAF/ Xarelto , Aortic Valve Replacement,/ IE abscess, Toxic Encephalopathy, Light sensitivity, Chronic Fatigue/fibromyalgia, CVA, 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       Wife here Increased hiccups, cough. Can't tell if there is reflux. Sweats but no fever. Unstable gait- fear of falling. We discussed trial of requip  to see if that would help limb movement any, while he reaches out to Neurology. They have felt that depomedrol inj helps, but insist he can't tolerate medrol  or prednisone tabs, which increase agitation. We discussed trial of hydrocortisone  for very low dose effect to see if he tolerates and finds it helps.  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 Dr Jesus didn't feel surgery necessary. CT chest 10/30/23- for f/u on LUL 7 mm lung nodule- 3 month f/u >>>> Discussed the use of AI scribe software for clinical note transcription with the patient, who gave verbal consent to proceed.  History of Present Illness   The patient, with a history of chronic cough, shoulder and hand issues, and a small spot in the lung, presents for a follow-up visit. The patient's cough has improved with hydrocortisone , as have the shoulder and hand issues. The patient's spouse reports that the patient's symptoms are less severe when taking two tablets of hydrocortisone  per day, and sometimes three tablets when the cough is particularly bad. The patient also takes ropinirole  (Requip ) for jerking movements, which has also helped with the symptoms.  The patient has been experiencing a new symptom of dizziness, headaches, and a sensation in the head that lasts for a minute or two. This sensation is sometimes preceded by double vision. The patient's spouse reports that during these episodes, the patient's face becomes red and he appears to lose balance.  The patient has also been experiencing some issues with his nose, ear, and throat, but surgery was deemed not beneficial. The patient had a CT scan of the chest recently, but the results are not yet available. The patient has a small spot in the lung that is being monitored.  The patient has made  lifestyle changes, including quitting drinking since September. The patient has also returned to the gym, although he has to be careful not to overdo it due to chronic fatigue.    Assessment and Plan:    Chronic Cough Improvement noted with hydrocortisone .  Possible tic disorder contributing to cough. -Continue hydrocortisone , adjust to 3 tablets per day as needed, but keep dose low as possible. -Refill prescription to allow for up to 3 tablets per day.  Restless Leg Syndrome Improvement noted with ropinirole  (Requip ). -Continue ropinirole , adjust to 3 tablets per day as needed. -Refill prescription to allow for up to 3 tablets per day.  Pulmonary Nodule 7mm nodule noted on CT chest performed on October 30, 2023. Awaiting official radiology report. -Await official radiology report. -Notify patient and Dr. Fleeta Ochoa of results when available.  Episodic Dizziness, Headache, and Facial Sensations New symptoms reported, possibly related to neurologic condition. -Recommend discussion with neurologist.  Alcohol Cessation Patient has stopped drinking alcohol since September. -Encourage continued abstinence from alcohol.  Follow-up No acute issues identified today. -Schedule follow-up appointment in 6 months, or sooner if needed.   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  CT chest 10/30/23 IMPRESSION: 1. Interval resolution of the previously noted part solid nodule on prior examination of 06/01/2023 in keeping with infectious or inflammatory process on prior examination. No suspicious or indeterminate pulmonary nodules are identified. 2. Status post aortic valve replacement and ascending  aortic tube graft repair. 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 Recommend annual imaging followup by CTA or MRA. This recommendation follows 2010 ACCF/AHA/AATS/ACR/ASA/SCA/SCAI/SIR/STS/SVM Guidelines for the Diagnosis and Management of Patients with Thoracic Aortic Disease. Circulation.2010; 121: Z733-z630. Aortic aneurysm NOS (ICD10-I71.9) 3. Minimal coronary artery calcification. 4. Morphologic changes in keeping with pulmonary arterial hypertension.   Aortic Atherosclerosis (ICD10-I70.0).     ROS-see HPI     + = positive Constitutional:   No-   weight loss, night sweats, fevers, chills, fatigue, lassitude. HEENT:   No-  headaches, difficulty swallowing, tooth/dental problems,  sore throat,       No-  sneezing, itching, ear ache,                    No-nasal congestion, + post nasal drip,  CV:  +chest pain, orthopnea, PND, swelling in lower extremities, anasarca,  dizziness, palpitations Resp: No- coughing up of blood.  +cough             change in color of mucus.  No- wheezing.   Skin: Clear GI:   heartburn, indigestion, abdominal pain, nausea, vomiting,  GU:  MS:  No-   joint pain or swelling.   Neuro-    +chorea, twitches and jerks, paroxysmal cough which might be same mechanism or something else. Psych:  No- change in mood or affect. +  depression or anxiety.  No memory loss.  OBJ- Physical Exam Sunglasses, fidgeting/ restless General- Alert, Oriented, Affect-appropriate, Distress- none acute. +fidgeting, restless, sunglasses Skin- +eczematoid patches on lower calves, nonspecific, excoriated Lymphadenopathy- none Head- atraumatic. Stares aimlessly around room like a blind person.             Eyes- Gross vision intact, PERRLA, conjunctivae and secretions clear,  + sunglasses            Ears- Hearing, canals-normal            Nose- Clear, no-Septal dev, mucus, polyps, erosion, perforation             Throat- Mallampati II-III,  mucosa , drainage- none, tonsils- atrophic Neck- flexible , trachea midline, no stridor , thyroid  nl, carotid no bruit Chest - symmetrical excursion , unlabored           Heart/CV- RRR ,  murmur+2-3/6 S, no gallop  , no rub, nl s1 s2,                            - JVD- none , edema- none, stasis changes- none, varices- none           Lung- + spastic cough, hiccups, wheeze-none, dullness-none, rub- none           Chest wall-  Abd-  Br/ Gen/ Rectal- Not done, not indicated Extrem- cyanosis- none, clubbing, none, atrophy- none, strength- nl Neuro- +constant movement of legs/ chorea

## 2024-05-02 ENCOUNTER — Ambulatory Visit: Payer: Medicare Other | Admitting: Internal Medicine

## 2024-05-02 ENCOUNTER — Encounter: Payer: Self-pay | Admitting: Internal Medicine

## 2024-05-02 VITALS — BP 142/85 | HR 57 | Ht 70.0 in | Wt 227.4 lb

## 2024-05-02 DIAGNOSIS — J209 Acute bronchitis, unspecified: Secondary | ICD-10-CM | POA: Diagnosis not present

## 2024-05-02 DIAGNOSIS — J440 Chronic obstructive pulmonary disease with acute lower respiratory infection: Secondary | ICD-10-CM

## 2024-05-02 DIAGNOSIS — Z87891 Personal history of nicotine dependence: Secondary | ICD-10-CM | POA: Diagnosis not present

## 2024-05-02 MED ORDER — METHYLPREDNISOLONE ACETATE 80 MG/ML IJ SUSP
80.0000 mg | Freq: Once | INTRAMUSCULAR | Status: AC
  Administered 2024-05-02: 80 mg via INTRAMUSCULAR

## 2024-05-02 MED ORDER — IPRATROPIUM BROMIDE 0.06 % NA SOLN: 2.0000 | Freq: Two times a day (BID) | NASAL | 1 refills | Status: DC

## 2024-05-02 MED ORDER — HYDROCORTISONE 5 MG PO TABS: ORAL_TABLET | ORAL | 5 refills | Status: DC

## 2024-05-02 MED ORDER — DOXYCYCLINE HYCLATE 100 MG PO TABS: 100.0000 mg | ORAL_TABLET | Freq: Two times a day (BID) | ORAL | 0 refills | Status: DC

## 2024-05-02 NOTE — Patient Instructions (Signed)
 Order- depomedrol 80 mg     dx acute bronchitis  Script sent for doxycycline   Continue CPAP 15

## 2024-05-04 DIAGNOSIS — K080 Exfoliation of teeth due to systemic causes: Secondary | ICD-10-CM | POA: Diagnosis not present

## 2024-05-10 ENCOUNTER — Ambulatory Visit: Payer: Self-pay

## 2024-05-10 MED ORDER — AZITHROMYCIN 250 MG PO TABS: 250.0000 mg | ORAL_TABLET | ORAL | 0 refills | Status: DC

## 2024-05-10 NOTE — Telephone Encounter (Signed)
 Offer Zpak 250 mg, # 6, 2 today then one daily  Does she have anything else in mind to help him?

## 2024-05-10 NOTE — Telephone Encounter (Signed)
 FYI Only or Action Required?: Action required by provider: update on patient condition and request for Rx.  Patient is followed in Pulmonology for COPD, last seen on 05/02/2024 by Neysa Reggy BIRCH, MD.  Called Nurse Triage reporting Cough and Bronchitis.  Symptoms began a week ago.  Interventions attempted: Rescue inhaler and Maintenance inhaler.  Symptoms are: unchanged.  Triage Disposition: See Physician Within 24 Hours  Patient/caregiver understands and will follow disposition?: No, wishes to speak with PCP        Copied from CRM [id number]. Topic: Clinical - Red Word Triage >> May 10, 2024  1:28 PM Chantha C wrote: Red Word that prompted transfer to Nurse Triage: Patient's spouse [name] [phone] states patient was prescribed Doxycyline and had Depomedrol shot in office visit 05/02/24 with Dr. Neysa. Patient is not better, feels like quit trying breath in, wheezing, coughing phlegm green in color. Patient denies fever, dizziness, nor pain. [name] is asking for another round of medication. Please advise.   [id number] [place], [place] - [address] AT [facility] OF [address] [place] [address] Phone: [phone] Fax: [phone] Reason for Disposition  [1] Continuous (nonstop) coughing interferes with work or school AND [2] no improvement using cough treatment per Care Advice  Answer Assessment - Initial Assessment Questions E2C2 Pulmonary Triage - Initial Assessment Questions Chief Complaint (e.g., cough, sob, wheezing, fever, chills, sweat or additional symptoms) *Go to specific symptom protocol after initial questions. Productive green cough, SOB Recent acute visit with Dr Neysa and was given abx and steroids - sx improved slightly, but now worsening  Wife believes he may need something stronger  How long have symptoms been present? > 1 week  Have you tested for COVID or Flu? Note: If not, ask patient if a home test can be taken.  If so, instruct patient to call back for positive results. No  MEDICINES:   Have you used any OTC meds to help with symptoms? No If yes, ask What medications? N/a  Have you used your inhalers/maintenance medication? Yes If yes, What medications? Symbicort  - as prescribed Albuterol  INH -without relief  If inhaler, ask How many puffs and how often? Note: Review instructions on medication in the chart. See above  OXYGEN : Do you wear supplemental oxygen ? No If yes, How many liters are you supposed to use? N/a  Do you monitor your oxygen  levels? No If yes, What is your reading (oxygen  level) today? 92-94  What is your usual oxygen  saturation reading?  (Note: Pulmonary O2 sats should be 90% or greater) 96-98     . ONSET: When did the cough begin?      See above 2. SEVERITY: How bad is the cough today?      bad 3. SPUTUM: Describe the color of your sputum (e.g., none, dry cough; clear, white, yellow, green)     green 4. HEMOPTYSIS: Are you coughing up any blood? If Yes, ask: How much? (e.g., flecks, streaks, tablespoons, etc.)     denies 5. DIFFICULTY BREATHING: Are you having difficulty breathing? If Yes, ask: How bad is it? (e.g., mild, moderate, severe)      Endorses coughing fits Mild-moderate per wife - endorses recent back surgery that has limited his mobility/currently on restrictions 6. FEVER: Do you have a fever? If Yes, ask: What is your temperature, how was it measured, and when did it start?     denies 7. CARDIAC HISTORY: Do you have any history of heart disease? (  e.g., heart attack, congestive heart failure)      Endorses artificial valves 2018 8. LUNG HISTORY: Do you have any history of lung disease?  (e.g., pulmonary embolus, asthma, emphysema)     Asthma, chronic bronchitis, nodules 9. PE RISK FACTORS: Do you have a history of blood clots? (or: recent major surgery, recent prolonged travel, bedridden)      denies 10. OTHER SYMPTOMS: Do you have any other symptoms? (e.g., runny nose, wheezing, chest pain)       Occasional wheeze Denies others  11. PREGNANCY: Is there any chance you are pregnant? When was your last menstrual period?       N/a 12. TRAVEL: Have you traveled out of the country in the last month? (e.g., travel history, exposures)       N/a    Pt recently seen on 7/14 by Dr Neysa and received tx. Triager attempted to schedule with [facility], but no access. Sx have not fully resolved and wife believes he may need a stronger medication. Triager will forward encounter for Dr. Neysa 's office to review and advise. Caregiver verbalized understanding and is expecting call back from office for next steps. Triager also advised that if pt does not hear back from office, to follow disposition with PCP for further evaluation/treatment.  Protocols used: Cough - Acute Productive-A-AH

## 2024-05-10 NOTE — Telephone Encounter (Signed)
 I called and spoke with [name], pt's spouse  She verbalized understanding She feels like Zpack is okay for now  Will call back if he is not improving  Rx was sent to preferred pharm  Nothing further needed

## 2024-05-26 DIAGNOSIS — G4733 Obstructive sleep apnea (adult) (pediatric): Secondary | ICD-10-CM | POA: Diagnosis not present

## 2024-06-07 DIAGNOSIS — R3915 Urgency of urination: Secondary | ICD-10-CM | POA: Diagnosis not present

## 2024-06-07 DIAGNOSIS — N401 Enlarged prostate with lower urinary tract symptoms: Secondary | ICD-10-CM | POA: Diagnosis not present

## 2024-06-07 DIAGNOSIS — R351 Nocturia: Secondary | ICD-10-CM | POA: Diagnosis not present

## 2024-06-09 DIAGNOSIS — M9901 Segmental and somatic dysfunction of cervical region: Secondary | ICD-10-CM | POA: Diagnosis not present

## 2024-06-09 DIAGNOSIS — M50322 Other cervical disc degeneration at C5-C6 level: Secondary | ICD-10-CM | POA: Diagnosis not present

## 2024-06-13 DIAGNOSIS — M9901 Segmental and somatic dysfunction of cervical region: Secondary | ICD-10-CM | POA: Diagnosis not present

## 2024-06-13 DIAGNOSIS — M50322 Other cervical disc degeneration at C5-C6 level: Secondary | ICD-10-CM | POA: Diagnosis not present

## 2024-06-14 ENCOUNTER — Other Ambulatory Visit: Payer: Self-pay | Admitting: Internal Medicine

## 2024-06-15 DIAGNOSIS — M9901 Segmental and somatic dysfunction of cervical region: Secondary | ICD-10-CM | POA: Diagnosis not present

## 2024-06-15 DIAGNOSIS — M50322 Other cervical disc degeneration at C5-C6 level: Secondary | ICD-10-CM | POA: Diagnosis not present

## 2024-06-17 NOTE — Telephone Encounter (Signed)
 Pt requesting refill of Atrovent , not mentioned on LOV notes whether to continue or d/c medication. Please advise, thank you!

## 2024-06-17 NOTE — Telephone Encounter (Signed)
Ipratropium nasal spray refilled ?

## 2024-06-26 DIAGNOSIS — G4733 Obstructive sleep apnea (adult) (pediatric): Secondary | ICD-10-CM | POA: Diagnosis not present

## 2024-06-30 ENCOUNTER — Other Ambulatory Visit: Payer: Self-pay | Admitting: Neurology

## 2024-06-30 NOTE — Telephone Encounter (Signed)
**Note De-identified  [place]bright Obfuscation** Please advise 

## 2024-07-21 DIAGNOSIS — M50322 Other cervical disc degeneration at C5-C6 level: Secondary | ICD-10-CM | POA: Diagnosis not present

## 2024-07-22 DIAGNOSIS — M25511 Pain in right shoulder: Secondary | ICD-10-CM | POA: Diagnosis not present

## 2024-07-22 DIAGNOSIS — M25512 Pain in left shoulder: Secondary | ICD-10-CM | POA: Diagnosis not present

## 2024-07-26 DIAGNOSIS — M9901 Segmental and somatic dysfunction of cervical region: Secondary | ICD-10-CM | POA: Diagnosis not present

## 2024-07-26 DIAGNOSIS — M50322 Other cervical disc degeneration at C5-C6 level: Secondary | ICD-10-CM | POA: Diagnosis not present

## 2024-07-28 DIAGNOSIS — M50322 Other cervical disc degeneration at C5-C6 level: Secondary | ICD-10-CM | POA: Diagnosis not present

## 2024-07-28 DIAGNOSIS — M9901 Segmental and somatic dysfunction of cervical region: Secondary | ICD-10-CM | POA: Diagnosis not present

## 2024-08-03 ENCOUNTER — Other Ambulatory Visit: Payer: Self-pay | Admitting: Surgery

## 2024-08-03 DIAGNOSIS — J4520 Mild intermittent asthma, uncomplicated: Secondary | ICD-10-CM | POA: Diagnosis not present

## 2024-08-03 DIAGNOSIS — J309 Allergic rhinitis, unspecified: Secondary | ICD-10-CM | POA: Diagnosis not present

## 2024-08-03 DIAGNOSIS — R1084 Generalized abdominal pain: Secondary | ICD-10-CM

## 2024-08-03 DIAGNOSIS — J209 Acute bronchitis, unspecified: Secondary | ICD-10-CM | POA: Diagnosis not present

## 2024-08-10 ENCOUNTER — Ambulatory Visit
Admission: RE | Admit: 2024-08-10 | Discharge: 2024-08-10 | Disposition: A | Discharge status: Home / Self Care | Source: Ambulatory Visit | Attending: Surgery | Admitting: Surgery

## 2024-08-10 DIAGNOSIS — K5730 Diverticulosis of large intestine without perforation or abscess without bleeding: Secondary | ICD-10-CM | POA: Diagnosis not present

## 2024-08-10 DIAGNOSIS — R1084 Generalized abdominal pain: Secondary | ICD-10-CM

## 2024-08-10 MED ORDER — IOPAMIDOL (ISOVUE-300) INJECTION 61%
100.0000 mL | Freq: Once | INTRAVENOUS | Status: AC | PRN
  Administered 2024-08-10: 100 mL via INTRAVENOUS

## 2024-08-12 ENCOUNTER — Ambulatory Visit: Payer: Self-pay | Admitting: Surgery

## 2024-08-31 ENCOUNTER — Ambulatory Visit: Payer: Self-pay | Admitting: Internal Medicine

## 2024-08-31 NOTE — Telephone Encounter (Signed)
 Wife is requesting limited questions at appointment- patient gets easily frustrated and winded with speaking. Provider is aware

## 2024-08-31 NOTE — Telephone Encounter (Signed)
 FYI Only or Action Required?: FYI only for provider: appointment scheduled on 11/13.  Patient is followed in Pulmonology for asthma, last seen on 05/02/2024 by Grant Reggy BIRCH, MD.  Called Nurse Triage reporting No chief complaint on file..  Symptoms began about a month ago.  Interventions attempted: OTC medications: Delsym , Mucinex , Prescription medications: hydrocortisone -3/daily , Rescue inhaler, and Maintenance inhaler.  Symptoms are: unchanged.  Triage Disposition: Appointment in 24 hours  Patient/caregiver understands and will follow disposition?: Copied from CRM #[id number]. Topic: Clinical - Red Word Triage >> Aug 31, 2024  9:26 AM Ismael A wrote: Red Word that prompted transfer to Nurse Triage: pt has been experiencing bronchitis symptoms since October- has chronic productive cough, feeling weak and fatigued, wheezing and rattling, and chest pain - has had  2 rounds of antibiotics which have not helped  E2C2 Pulmonary Triage - Initial Assessment Questions "Chief Complaint (e.g., cough, sob, wheezing, fever, chills, sweat or additional symptoms) *Go to specific symptom protocol after initial questions. Cough-chest pain, wheezing, fatigue  "How long have symptoms been present?" OV-10/15,11/3-PCP was treated with Augmentin  800 for 10 day and then with Doxycycline -presently  Have you tested for COVID or Flu? Note: If not, ask patient if a home test can be taken. If so, instruct patient to call back for positive results. Yes, home test COVID 10/15- negative  MEDICINES:   "Have you used any OTC meds to help with symptoms?" Yes If yes, ask "What medications?" Mucinex  DM, Delsym , cough drops- helped but not fixing thing  "Have you used your inhalers/maintenance medication?" Yes If yes, "What medications?" Albuterol - using inhaler 4-5 times yesterday Symbicort - twice daily  If inhaler, ask "How many puffs and how often?" Note: Review instructions on medication in the chart. Had  to use at lunch yesterday and last night watching TV- several times  OXYGEN : "Do you wear supplemental oxygen ?" No If yes, "How many liters are you supposed to use?" na  "Do you monitor your oxygen  levels?" Yes If yes, What is your reading (oxygen  level) today? Has not check today- declines  What is your usual oxygen  saturation reading?  (Note: Pulmonary O2 sats should be 90% or greater) Mid 90  Reason for Disposition  SEVERE coughing spells (e.g., whooping sound after coughing, vomiting after coughing)  Answer Assessment - Initial Assessment Questions 1. ONSET: When did the cough begin?      OV- 10/15, 11/3- PCP treated antibiotics Augmentin , Doxycycline  2. SEVERITY: How bad is the cough today?      Having coughing spells- severe sounds wet 3. SPUTUM: Describe the color of your sputum (e.g., none, dry cough; clear, white, yellow, green)     White to green, thick 4. HEMOPTYSIS: Are you coughing up any blood? If Yes, ask: How much? (e.g., flecks, streaks, tablespoons, etc.)     Sometimes- feels throat is raw, small amount 5. DIFFICULTY BREATHING: Are you having difficulty breathing? If Yes, ask: How bad is it? (e.g., mild, moderate, severe)      Moderate- unless coughing 6. FEVER: Do you have a fever? If Yes, ask: What is your temperature, how was it measured, and when did it start?     no 7. CARDIAC HISTORY: Do you have any history of heart disease? (e.g., heart attack, congestive heart failure)      BP occasionally up due to cough- heart disease 8. LUNG HISTORY: Do you have any history of lung disease?  (e.g., pulmonary embolus, asthma, emphysema)     Asthma, wheezing 9. PE  RISK FACTORS: Do you have a history of blood clots? (or: recent major surgery, recent prolonged travel, bedridden)     no 10. OTHER SYMPTOMS: Do you have any other symptoms? (e.g., runny nose, wheezing, chest pain)       Wheezing, exhausted , chest pain  Protocols used: Cough -  Acute Productive-A-AH

## 2024-09-01 ENCOUNTER — Encounter: Payer: Self-pay | Admitting: Internal Medicine

## 2024-09-01 ENCOUNTER — Ambulatory Visit: Admitting: Internal Medicine

## 2024-09-01 VITALS — BP 136/78 | HR 70 | Temp 98.2°F | Ht 70.0 in | Wt 224.2 lb

## 2024-09-01 DIAGNOSIS — J42 Unspecified chronic bronchitis: Secondary | ICD-10-CM | POA: Diagnosis not present

## 2024-09-01 DIAGNOSIS — J209 Acute bronchitis, unspecified: Secondary | ICD-10-CM

## 2024-09-01 MED ORDER — AZITHROMYCIN 250 MG PO TABS: ORAL_TABLET | ORAL | 0 refills | Status: AC

## 2024-09-01 MED ORDER — BUDESONIDE-FORMOTEROL FUMARATE 160-4.5 MCG/ACT IN AERO: 2.0000 | INHALATION_SPRAY | Freq: Two times a day (BID) | RESPIRATORY_TRACT | 12 refills | Status: AC

## 2024-09-01 MED ORDER — METHYLPREDNISOLONE ACETATE 80 MG/ML IJ SUSP
120.0000 mg | Freq: Once | INTRAMUSCULAR | Status: AC
  Administered 2024-09-01: 120 mg via INTRAMUSCULAR

## 2024-09-01 MED ORDER — ALBUTEROL SULFATE HFA 108 (90 BASE) MCG/ACT IN AERS
INHALATION_SPRAY | RESPIRATORY_TRACT | 12 refills | Status: AC
Start: 2024-09-01 — End: ?

## 2024-09-01 NOTE — Progress Notes (Signed)
 History of Present Illness: . male former smoker with asthma/chronic bronchitis and allergic rhinitis, OSA PAF/ anticoagulation, aortic valve replacement, toxic encephalopathy, light sensitivity, chronic fatigue/fibromyalgia NPSG 03/24/03- AHI 88/ hr, desaturation to 75%, body weight 244 lbs PFT 06/01/17-WNL-FVC 4.46/96%, FEV1 3.71/106%, ratio 0.83, FEF 25-75% 4.64/167%, no response to dilator, TLC 107%, DLCO 101%  PFT 07/07/22- ==============================================================================================================   05/02/24- 73 year old male former smoker (13.5 pkyrs) followed for Asthma/chronic Bronchitis and allergic Rhinitis,  OSA, Lung Nodule,  complicated by PAF/ Xarelto , Aortic Valve Replacement,/ IE abscess, Toxic Encephalopathy, Light sensitivity, Chronic Fatigue/fibromyalgia, CVA, 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             Wife here. Discussed the use of AI scribe software for clinical note transcription with the patient, who gave verbal consent to proceed.  History of Present Illness   Grant [name] is a 73 year old male followed for OSA, chronic bronchitis and with chronic fatigue syndrome who presents with exacerbation of symptoms following recent back surgery.  He experiences congestion and a dry cough, resembling bronchitis, which began last Thursday. He has been treated with antibiotics and Depo shots for similar symptoms in the past. He does not tolerate prednisone well but responds to Depo shots. Sometimes the pattern of his cough has suggested a tic.  He has sleep apnea and uses a CPAP machine at night. Download confirms good compliance and  control.  His chronic fatigue syndrome exacerbates following significant events like surgeries, impacting his ability to engage in physical activities. It is the basis for his disability. He has not yet been released after his back susgery to begin PT.     CT chest 10/30/23 IMPRESSION: 1. Interval resolution of the previously noted part solid nodule on prior examination of 06/01/2023 in keeping with infectious or inflammatory process on prior examination. No suspicious or indeterminate pulmonary nodules are identified. 2. Status post aortic valve replacement and ascending aortic tube graft repair. 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 Recommend annual imaging followup by CTA or MRA. This recommendation follows 2010 ACCF/AHA/AATS/ACR/ASA/SCA/SCAI/SIR/STS/SVM Guidelines for the Diagnosis and Management of Patients with Thoracic Aortic Disease. Circulation.2010; 121: Z733-z630. Aortic aneurysm NOS (ICD10-I71.9) 3. Minimal coronary artery calcification. 4. Morphologic changes in keeping with pulmonary arterial hypertension. Aortic Atherosclerosis (ICD10-I70.0).  Assessment and Plan:    Acute bronchitis Acute bronchitis with congestion and significant coughing. Avoided prednisone due to poor tolerance. - Administer Depo shot. - Prescribe doxycycline . - Send prescription to [facility] on [address].  Chronic fatigue syndrome Chronic fatigue syndrome exacerbated by recent surgery. Current episode causing significant fatigue and mental strain. - Suggest moderate caffeine intake earlier in the day to assess impact on fatigue.  Postoperative status following discectomy and laminectomy Postoperative status following discectomy and laminectomy at C3-C4. Recovery ongoing with activity restrictions to prevent fatigue exacerbation. - Encourage walking as tolerated. - Plan follow-up with surgeon in three weeks to assess readiness for physical  therapy.  Obstructive sleep apnea Obstructive sleep apnea well-controlled with CPAP  therapy. CPAP download indicates good control.  Follow-up Follow-up plan discussed for continuity of care post-retirement of current provider. - Schedule follow-up appointment in six months with new provider.     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 He feels his cough/ bronchitis exxacerbations respond best to Depomedrol plus antibiotic. Continues maintenance hydrocortisone  15 mg daily, hoping to suppress cough/ bronchitis, although probably multifactorial.  Intolerant prednisone - mood. Discussed the use of AI scribe software for clinical note transcription with the patient, who gave verbal consent to proceed.  History of Present Illness   The patient presents with chronic bronchitis and persistent cough. This is characteristic exacerbation for him.  He experiences wheezing and phlegm production. Augmentin  and doxycycline  treatments from PCP, and depomedrol 80 mg, were ineffective. Chest pain is present due to excessive coughing. A Depo Medrol  shot on October 15 and hydrocortisone  three times daily have not alleviated symptoms. He takes Mucinex  DM. Sputum is mostly white. No fever.  He had a severe episode of dyspnea where he felt his lungs 'quit' for about five  minutes. His inhaler provided some relief. Similar episodes have occurred, including one this summer. Episodes sound like laryngospasm.  He uses a CPAP machine daily around 5 PM, which functions well. Download looks good.     Assessment and Plan:    Chronic bronchitis exacerbation with persistent cough and chest pain Persistent cough, wheezing, and chest pain despite previous treatments. Symptoms indicate chronicity and severity. - Prescribed Z-Pak (azithromycin ) for broad-spectrum coverage. - Administered Depo Medrol  injection.  Laryngospasm episodes Episodes of sudden inability to breathe, relieved by inhaler use.  Obstructive sleep apnea, on CPAP Managed with CPAP.  Asthma Management includes albuterol  and Symbicort  inhalers, both expired. - Refilled albuterol  inhaler. - Refilled Symbicort  inhaler.     ROS- see HPI                  + = positive                     HEENT:   No-  headaches, difficulty swallowing, tooth/dental problems,  sore throat,       No-  sneezing, itching, ear ache,                    No-nasal congestion, + post nasal drip,  CV:  +chest pain, orthopnea, PND, swelling in lower extremities, anasarca,  dizziness, palpitations Resp: No- coughing up of blood.  +cough             change in color of mucus.  No- wheezing.   Skin: Clear GI:   heartburn, indigestion, abdominal pain, nausea, vomiting,  GU:  MS:  No-   joint pain or swelling.   Neuro-    +chorea, twitches and  jerks, paroxysmal cough which might be same mechanism or something else. Psych:  No- change in mood or affect. + depression or anxiety.  No memory loss.  OBJ- Physical Exam Sunglasses, fidgeting/ restless General- Alert, Oriented, Affect-appropriate, Distress- none acute. +fidgeting, restless, sunglasses Skin- +eczematoid patches on lower calves, nonspecific, excoriated Lymphadenopathy- none Head- atraumatic. Stares aimlessly around room like a blind person.             Eyes- Gross vision  intact, PERRLA, conjunctivae and secretions clear,  + sunglasses            Ears- Hearing, canals-normal            Nose- Clear, no-Septal dev, mucus, polyps, erosion, perforation             Throat- Mallampati II-III, mucosa , drainage- none, tonsils- atrophic Neck- flexible , trachea midline, no stridor , thyroid  nl, carotid no bruit Chest - symmetrical excursion , unlabored           Heart/CV- RRR ,  murmur+2-3/6 S, no gallop  , no rub, nl s1 s2,                            - JVD- none , edema- none, stasis changes- none, varices- none           Lung- + spastic dry cough, +Clear,  wheeze-none, dullness-none, rub- none           Chest wall-  Abd-  Br/ Gen/ Rectal- Not done, not indicated Extrem- cyanosis- none, clubbing, none, atrophy- none, strength- nl Neuro- +constant movement of legs/ chorea

## 2024-09-01 NOTE — Patient Instructions (Addendum)
 Order- Depomedrol 120 mg    dx  Chronic Bronchitis exacerbation  Script sent for Zpak antibiotic

## 2024-09-05 ENCOUNTER — Encounter: Payer: Self-pay | Admitting: Internal Medicine

## 2024-09-06 DIAGNOSIS — M9903 Segmental and somatic dysfunction of lumbar region: Secondary | ICD-10-CM | POA: Diagnosis not present

## 2024-09-06 DIAGNOSIS — M9904 Segmental and somatic dysfunction of sacral region: Secondary | ICD-10-CM | POA: Diagnosis not present

## 2024-09-06 DIAGNOSIS — M9905 Segmental and somatic dysfunction of pelvic region: Secondary | ICD-10-CM | POA: Diagnosis not present

## 2024-09-06 DIAGNOSIS — M5136 Other intervertebral disc degeneration, lumbar region with discogenic back pain only: Secondary | ICD-10-CM | POA: Diagnosis not present

## 2024-09-08 ENCOUNTER — Other Ambulatory Visit: Payer: Self-pay | Admitting: Cardiovascular Disease

## 2024-09-08 NOTE — Telephone Encounter (Signed)
 Prescription refill request for Xarelto  received.  Indication:afib Last office visit:6/25 Weight:101.7  kg Age:73 Scr:0.98  6/25 CrCl:99.45  ml/min  Prescription refilled

## 2024-09-14 DIAGNOSIS — I10 Essential (primary) hypertension: Secondary | ICD-10-CM | POA: Diagnosis not present

## 2024-09-14 DIAGNOSIS — F3341 Major depressive disorder, recurrent, in partial remission: Secondary | ICD-10-CM | POA: Diagnosis not present

## 2024-09-14 DIAGNOSIS — R251 Tremor, unspecified: Secondary | ICD-10-CM | POA: Diagnosis not present

## 2024-09-14 DIAGNOSIS — J4520 Mild intermittent asthma, uncomplicated: Secondary | ICD-10-CM | POA: Diagnosis not present

## 2024-09-19 DIAGNOSIS — R109 Unspecified abdominal pain: Secondary | ICD-10-CM | POA: Diagnosis not present

## 2024-09-19 DIAGNOSIS — R197 Diarrhea, unspecified: Secondary | ICD-10-CM | POA: Diagnosis not present

## 2024-09-20 DIAGNOSIS — I10 Essential (primary) hypertension: Secondary | ICD-10-CM | POA: Diagnosis not present

## 2024-09-20 DIAGNOSIS — E291 Testicular hypofunction: Secondary | ICD-10-CM | POA: Diagnosis not present

## 2024-09-20 DIAGNOSIS — E039 Hypothyroidism, unspecified: Secondary | ICD-10-CM | POA: Diagnosis not present

## 2024-09-23 ENCOUNTER — Other Ambulatory Visit: Payer: Self-pay | Admitting: Cardiovascular Disease

## 2024-10-10 NOTE — Progress Notes (Signed)
 " History of Present Illness: . male former smoker with asthma/chronic bronchitis and allergic rhinitis, OSA PAF/ anticoagulation, aortic valve replacement, toxic encephalopathy, light sensitivity, chronic fatigue/fibromyalgia NPSG 03/24/03- AHI 88/ hr, desaturation to 75%, body weight 244 lbs PFT 06/01/17-WNL-FVC 4.46/96%, FEV1 3.71/106%, ratio 0.83, FEF 25-75% 4.64/167%, no response to dilator, TLC 107%, DLCO 101%  PFT 07/07/22- ==============================================================================================================      09/01/24-73 year old male former smoker (13.5 pkyrs) followed for Asthma/chronic Bronchitis and allergic Rhinitis,  OSA, Lung Nodule,  complicated by PAF/ Xarelto , Aortic Valve Replacement,/ IE abscess, Toxic Encephalopathy, Light sensitivity, Chronic Fatigue/fibromyalgia, CVA, 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 He feels his cough/ bronchitis exxacerbations respond best to Depomedrol plus antibiotic. Continues maintenance hydrocortisone  15 mg daily, hoping to suppress cough/ bronchitis, although probably multifactorial.  Intolerant prednisone - mood. Discussed the use of AI scribe software for clinical note transcription with the patient, who gave verbal consent to proceed.  History of Present Illness   The patient presents with chronic bronchitis and persistent cough. This is characteristic exacerbation for him.  He experiences wheezing and phlegm production. Augmentin  and doxycycline  treatments from PCP, and depomedrol 80 mg, were ineffective. Chest pain is present due to excessive  coughing. A Depo Medrol  shot on October 15 and hydrocortisone  three times daily have not alleviated symptoms. He takes Mucinex  DM. Sputum is mostly white. No fever.  He had a severe episode of dyspnea where he felt his lungs 'quit' for about five minutes. His inhaler provided some relief. Similar episodes have occurred, including one this summer. Episodes sound like laryngospasm.  He uses a CPAP machine daily around 5 PM, which functions well. Download looks good.     Assessment and Plan:    Chronic bronchitis exacerbation with persistent cough and chest pain Persistent cough, wheezing, and chest pain despite previous treatments. Symptoms indicate chronicity and severity. - Prescribed Z-Pak (azithromycin ) for broad-spectrum coverage. - Administered Depo Medrol  injection.  Laryngospasm episodes Episodes of sudden inability to breathe, relieved by inhaler use.  Obstructive sleep apnea, on CPAP Managed with CPAP.  Asthma Management includes albuterol  and Symbicort  inhalers, both expired. - Refilled albuterol  inhaler. - Refilled Symbicort  inhaler.    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 Wife, more than he, interested in another depomedrol inj  today, hoping to hold control of his chronic bronchitis/ cough at least until he establishes with next physician.    Intolerant of oral steroids due to mood change, but tolerates depomedrol. Discussed the use of AI scribe software for clinical note transcription with the patient, who gave verbal consent to proceed.  History of Present Illness   Grant [name] is a 73 year old male with OSA, chronic bronchitis, complex neurologic issues and paroxysmal atrial fibrillation who presents for follow-up and medication refills.  He has a persistent intermittent cough that has not fully resolved and typically takes a long time to improve after flares. Weather changes usually worsen symptoms. This year his recovery and activity are reduced after recent back surgery.  He takes hydrocodone  and hydrocortisone  5 mg two tablets per day. He uses ipratropium nasal spray and budesonide  nebulizer solution and has several boxes of budesonide  at home.  He has paroxysmal atrial fibrillation on Xarelto  and does not perceive episodes. He has felt significantly unwell for the past couple of days, without purulent sputum or fever.  He had bronchitis previously treated with several antibiotic courses including doxycycline  and an additional injectable antibiotic, after which he had gastrointestinal upset and concern for C. diff, though not confirmed. He still has stomach issues and low protein levels.      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 Reduced medication use indicates symptom improvement. - Continue hydrocortisone  5 mg, 180 tablets for three months, refillable for a year. - Continue ipratropium nasal spray, refillable for a year. - Administered 120 mg depomedrol to aid symptom resolution.  Paroxysmal atrial fibrillation- managed by Cardiology Currently asymptomatic. Heart rate within control goals. Continues on Xarelto  for anticoagulation. - Continue Xarelto  for anticoagulation.  Gastrointestinal symptoms  post-antibiotics Persistent gastrointestinal symptoms following multiple antibiotic courses for bronchitis. Symptoms may be related to antibiotic use. - Consider using Kaopectate for symptom relief.     OSA- benefits from CPAP with good compliance and  control     ROS- see HPI                  + = positive                     HEENT:   No-  headaches, difficulty swallowing, tooth/dental problems,  sore throat,       No-  sneezing, itching, ear ache,                    No-nasal congestion, + post nasal drip,  CV:  +chest pain, orthopnea, PND, swelling in lower extremities, anasarca,  dizziness, palpitations Resp: No- coughing up of blood.  +cough             change in color of mucus.  No- wheezing.   Skin: Clear GI:   heartburn, indigestion, abdominal pain, nausea, vomiting,  GU:  MS:  No-   joint pain or swelling.   Neuro-    +chorea, twitches and jerks, paroxysmal cough which might be same mechanism or something else. Psych:  No- change in mood or affect. + depression or anxiety.  No memory loss.  OBJ- Physical Exam Sunglasses, fidgeting/ restless General- Alert, Oriented, Affect-appropriate, Distress- none acute. +fidgeting, restless, sunglasses Skin- +eczematoid patches on lower calves, nonspecific, excoriated Lymphadenopathy- none Head- atraumatic. Stares aimlessly around room like a blind person.  Eyes- Gross vision intact, PERRLA, conjunctivae and secretions clear,  + sunglasses            Ears- Hearing, canals-normal            Nose- Clear, no-Septal dev, mucus, polyps, erosion, perforation             Throat- Mallampati II-III, mucosa , drainage- none, tonsils- atrophic Neck- flexible , trachea midline, no stridor , thyroid  nl, carotid no bruit Chest - symmetrical excursion , unlabored           Heart/CV- RRR ,  murmur+2-3/6 S, no gallop  , no rub, nl s1 s2,                            - JVD- none , edema- none, stasis changes- none, varices- none           Lung-  + spastic dry cough, +Clear,  wheeze-none, dullness-none, rub- none           Chest wall-  Abd-  Br/ Gen/ Rectal- Not done, not indicated Extrem- cyanosis- none, clubbing, none, atrophy- none, strength- nl Neuro- +constant movement of legs/ chorea "

## 2024-10-11 ENCOUNTER — Ambulatory Visit: Admitting: Internal Medicine

## 2024-10-11 ENCOUNTER — Encounter: Payer: Self-pay | Admitting: Internal Medicine

## 2024-10-11 VITALS — BP 128/58 | HR 89 | Temp 98.0°F | Ht 70.0 in | Wt 236.0 lb

## 2024-10-11 DIAGNOSIS — J42 Unspecified chronic bronchitis: Secondary | ICD-10-CM | POA: Diagnosis not present

## 2024-10-11 DIAGNOSIS — G4733 Obstructive sleep apnea (adult) (pediatric): Secondary | ICD-10-CM

## 2024-10-11 DIAGNOSIS — I480 Paroxysmal atrial fibrillation: Secondary | ICD-10-CM

## 2024-10-11 DIAGNOSIS — R053 Chronic cough: Secondary | ICD-10-CM

## 2024-10-11 DIAGNOSIS — J209 Acute bronchitis, unspecified: Secondary | ICD-10-CM

## 2024-10-11 MED ORDER — METHYLPREDNISOLONE ACETATE 80 MG/ML IJ SUSP
120.0000 mg | Freq: Once | INTRAMUSCULAR | Status: AC
  Administered 2024-10-11: 120 mg via INTRAMUSCULAR

## 2024-10-11 MED ORDER — HYDROCORTISONE 5 MG PO TABS: ORAL_TABLET | ORAL | 4 refills | Status: AC

## 2024-10-11 MED ORDER — BENZONATATE 200 MG PO CAPS: 200.0000 mg | ORAL_CAPSULE | Freq: Three times a day (TID) | ORAL | 5 refills | Status: AC | PRN

## 2024-10-11 MED ORDER — IPRATROPIUM BROMIDE 0.06 % NA SOLN: 2.0000 | Freq: Two times a day (BID) | NASAL | 12 refills | Status: AC

## 2024-10-11 MED ORDER — ROPINIROLE HCL 0.25 MG PO TABS: 0.2500 mg | ORAL_TABLET | Freq: Three times a day (TID) | ORAL | 12 refills | Status: AC

## 2024-10-11 NOTE — Patient Instructions (Addendum)
 Meds refilled as discussed  Keep March 16 appointment with Dr Kara as planned, but let us  know if you need help sooner  Order- depo 120     dx chronic bronchitis exacerbation

## 2024-10-23 NOTE — Progress Notes (Signed)
 "  NEUROLOGY FOLLOW UP OFFICE NOTE  Grant [name] [id number] 1951/09/15   Discussed the use of AI scribe software for clinical note transcription with the patient, who gave verbal consent to proceed.  History of Present Illness I had the pleasure of seeing Grant [name] in follow-up in the neurology clinic on 10/24/2024.  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 He is again accompanied by his wife Grant [name] who helps supplement the history today.  Records and images were personally reviewed where available.  He is on Aimovig , Venlafaxine  XR 150mg  daily (increased 2 months ago by PCP which has helped) and Topiramate  25mg  every night for migraine prophylaxis. He has prn Maxalt  for rescue.   He has a history of chronic migraines and is currently on Aimovig  and topiramate . He reports that since starting topiramate , he has experienced fewer and less severe migraines. He takes topiramate  every night and has been on an increased dose of venlafaxine  (150 mg) for the past two months, which has also helped. He experiences migraine symptoms daily, but they do not always progress to full migraines. Rizatriptan  is used as needed, sometimes almost daily during certain periods, while at other times it is not used for extended periods. Insurance limits the amount of rizatriptan  he can obtain, leading him to pay out of pocket when necessary. No 'brain shivers' have been experienced since starting topiramate . They report the chorea has not gone away but not as bad. She very rarely sees the lip smacking. He is bouncing his right leg today but she states he is nervous, she does not see this as much at home as well.  He reports a lack of appetite and difficulty swallowing, which has been ongoing. He forces himself to eat due to medication requirements. He has seen a GI specialist for swallowing issues, and there is a noted left-sided throat issue that is not  currently problematic. A severe bronchitis episode in October required multiple antibiotics and a Depo shot, leading to persistent diarrhea. He takes Florastor and eats yogurt to manage this, but still experiences episodes of diarrhea. A stool sample for C. diff was suggested but not completed.  He has a history of back issues, including a laminectomy and discectomy in June of the previous year due to a disc blowout. Post-surgery, he was advised to limit movement, which led to decreased physical activity and weakness. He regrets not participating in post-surgery therapy. He is working on increasing his physical activity but still experiences balance issues and is cautious to avoid falls. He has not fallen but is aware of his instability and uses support when needed. Massages for back and hip pain have improved his walking and reduced pain.  He has a history of intestinal surgeries, which have contributed to ongoing stomach issues. He takes Gemtesa prescribed by a urologist, which has helped with urinary symptoms. He is working on increasing his physical activity after a period of immobility due to back surgery.   History on Initial Assessment 03/02/2019: This is a 74 year old right-handed man with a history of OSA, atrial fibrillation on chronic anticoagulation, aortic valve replacement, depression, chronic fatigue syndrome/fibromyalgia, presenting after hospital admission last 02/16/2019 for altered mental status. He was in his usual state of health (a little unusual all the time anyway), working in his shop when he stopped doing one thing and could not remember what he was going to do next. As this was going  on, his wife came to the shop and he does not remember much until they were riding in the car to the hospital. His wife reports that he has had baseline confusion with his chronic fatigue syndrome for more than 15 years, worse when he is tired, but this day was unusual because he was looking all over  the place not knowing what he was doing, and he kept asking her the same thing repeatedly. He would usually argue when he goes to the doctor, but this time did not argue, he was not as cognizant of how different/serious it was while he was in the hospital. His wife reported that he had been bouncing off the edge of a migraine daily for 2-3 weeks prior. He was taking a lot of Tylenol , Aleve, and Maxalt . It appears he was back to baseline in the ER. Bloodwork was normal, UDS and EtOH level negative. I personally reviewed MRI/MRA brain which did not show any acute changes. There was mild chronic microvascular disease. There was scattered small vessel atherosclerotic change without significant stenosis. There was an incidental finding of a 3mm focal outpouching at the left MCA bifurcation, suspicious for aneurysm. His wake and drowsy EEG was normal. Symptoms felt due to transient global amnesia or migraine. No further recurrence of similar significant confusion.  They report a complicated history since his early 10s which has been diagnosed as chronic fatigue syndrome (CFS). He takes Effexor . His wife states it has been more than 15 years where he has been confused and kind of forgetful. 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 In general he occasionally spaces out, but no episodes of unresponsiveness. He would have days where he speaks and thinks better, figuring out things faster, usually when he has an adrenaline burst, then would crash for a couple of days. If he gets overstimulated or surprised by a loud sound or too much information, he would start going somewhere else and get confused. He got lost in a store one time. It can last 1-2 hours or a whole day before he is back to his usual self. 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 He has had migraines since childhood, usually with stabbing pain in the left temporal or  retroorbital regions. If he does not stop them, they may go on all day. He would have tunnel vision and see black shimmering objects, with nausea and light sensitivity, tunnel vision. His left eye would almost shut. He has been taking Maxalt  every other day for the past few weeks. He has never been on a preventative medication. He took perphenazine in the past but it caused tongue swelling. He took it a week or so ago and his tongue swelled up again. They report balance issues for the past 1.5 years, he would have extreme fatigue where he feels like he would pass out, occasional vertical diplopia. He was in the hospital while visiting the beach in 08/2018. They fell since his cardiac surgery in 2018, more funny things have been going on since then. He does not drive. His mother and maternal grandmother had migraines. He denies any focal numbness/tingling/weakness, dysphagia. Sleep is good with CPAP machine.   Update 12/19/2022: Since his last visit with me 4 months ago, he has kindly been seen by our Movement Disorders specialist Dr. Evonnie who did a careful review of history of imaging. 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 He had a repeat brain MRI with and without contrast done 11/2022 with no acute changes, unchanged small remote infarct in the left caudate nucleus. Huntington disease panel was negative. On further review of imaging dating back to 2020, there were no caudate changes seen in 01/2019 and 12/2020. MRI in 12/2020 was ordered for these symptoms. On brain MRI in 10/2021 and moving forward (04/2022, 08/2022, 11/2022), the small remote infarct in the left caudate head is noted. After discussion with Dr. 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 Recommendations included a trial of Amantadine , and if symptoms still uncontrolled,  Xenazine/tetrabenazine/Ingrezza  are still possibilities. Benzodiazepine is not ideal given his history of alcohol use. He did try the Ingrezza  in January, but the day after he felt bad with low heart rate, cold sweats, then the day after he had chest pressure and extra beats with HR of 59. He reports he felt erratic heart beat, chest pain. She did not give the dose over the weekend then he had another episode Sunday.    Diagnostic Data: MRI brain without contrast in 12/2020 did not show any acute changes, there was mild chronic microvascular disease in the right paracentral pons, stable chronic microhemorrhage in the left frontal lobe.  Brain MRI in 10/2021 and moving forward (04/2022, 08/2022, 11/2022), showed small remote infarct in the left caudate head Wake and sleep EEG in 12/2020 was normal  Bloodwork for CK, aldolase, ceruloplasmin, copper , PTH, ferritin, sedimentation rate, ANA, antiphospholipid antibody, lupus anticoagulant, RPR normal in 06/2021  PAST MEDICAL HISTORY: Past Medical History:  Diagnosis Date   Allergic rhinitis    Anxiety    Arthritis    Asthma    Bladder tumor    Chronic fatigue    Complication of anesthesia    Agitation afterward   COVID-19 05/22/2021   Depression    06/30/17 Pt denies being depressed, reports Effexor  is taken for Chronic Fatigue    Dyspnea    Dysrhythmia    A. Fib   Enlarged prostate    Fibromyalgia    GERD (gastroesophageal reflux disease)    History of chronic bronchitis    History of migraine    light and sound sensitive   History of toxic encephalopathy    Hypothyroidism    OSA on CPAP    CPAP 14   PAF (paroxysmal atrial fibrillation) (HCC) CARDIOLOGIST -- DR DELFORD   DX OCT 2013   Pneumonia 2023   S/P AVR (aortic valve replacement) and aortoplasty    Stroke [facility])    has post stroke chorea   Unspecified essential hypertension    Urethral tumor    PROSTATIC    MEDICATIONS: Medications Ordered Prior to  Encounter[1]  ALLERGIES: Allergies[2]  FAMILY HISTORY: Family History  Problem Relation Age of Onset   Aortic aneurysm Mother 45       cause of death   Other Father        motor vehicle accident   Heart disease Other        family history    SOCIAL HISTORY: Social History   Socioeconomic History   Marital status: Married    Spouse name: Not on file   Number of children: Not on file   Years of education: Not on file   Highest education level: Not  on file  Occupational History   Occupation: disabled    Comment: owner of buisness  Tobacco Use   Smoking status: Former    Current packs/day: 0.00    Average packs/day: 0.5 packs/day for 27.0 years (13.5 ttl pk-yrs)    Types: Cigarettes    Start date: 66    Quit date: 10/21/1979    Years since quitting: 45.0   Smokeless tobacco: Never  Vaping Use   Vaping status: Never Used  Substance and Sexual Activity   Alcohol use: Not Currently    Alcohol/week: 10.0 standard drinks of alcohol    Types: 4 Cans of beer, 6 Standard drinks or equivalent per week   Drug use: Never   Sexual activity: Not Currently  Other Topics Concern   Not on file  Social History Narrative   Right handed   One story home   Drinks no caffeine   Lives with wife    Social Drivers of Health   Tobacco Use: Medium Risk (10/11/2024)   Patient History    Smoking Tobacco Use: Former    Smokeless Tobacco Use: Never    Passive Exposure: Not on file  Financial Resource Strain: Unknown (11/22/2021)   Overall Financial Resource Strain (CARDIA)    Difficulty of Paying Living Expenses: Patient declined  Food Insecurity: No Food Insecurity (03/27/2023)   Hunger Vital Sign    Worried About Running Out of Food in the Last Year: Never true    Ran Out of Food in the Last Year: Never true  Transportation Needs: No Transportation Needs (03/27/2023)   PRAPARE - Administrator, Civil Service (Medical): No    Lack of Transportation (Non-Medical): No   Physical Activity: Unknown (11/22/2021)   Exercise Vital Sign    Days of Exercise per Week: 4 days    Minutes of Exercise per Session: Not on file  Stress: Unknown (11/22/2021)   [employer] of Occupational Health - Occupational Stress Questionnaire    Feeling of Stress : Patient declined  Social Connections: Unknown (11/22/2021)   Social Connection and Isolation Panel    Frequency of Communication with Friends and Family: More than three times a week    Frequency of Social Gatherings with Friends and Family: Once a week    Attends Religious Services: Patient declined    Database Administrator or Organizations: No    Attends Engineer, Structural: Not on file    Marital Status: Married  Intimate Partner Violence: Patient Unable To Answer (03/27/2023)   Humiliation, Afraid, Rape, and Kick questionnaire    Fear of Current or Ex-Partner: Patient unable to answer    Emotionally Abused: Patient unable to answer    Physically Abused: Patient unable to answer    Sexually Abused: Patient unable to answer  Depression (PHQ2-9): Low Risk (04/04/2022)   Depression (PHQ2-9)    PHQ-2 Score: 0  Alcohol Screen: Medium Risk (11/22/2021)   Alcohol Screen    Last Alcohol Screening Score (AUDIT): 8  Housing: Unknown (01/20/2024)   Received from [facility]   Epic    Unable to Pay for Housing in the Last Year: Not on file    Number of Times Moved in the Last Year: Not on file    At any time in the past 12 months, were you homeless or living in a shelter (including now)?: No  Utilities: Not At Risk (03/27/2023)   [facility] Utilities    Threatened with loss of utilities: No  Health Literacy: Not on file     PHYSICAL EXAM: Vitals:   10/24/24 1111  BP: (!) 142/87  Pulse: 70  SpO2: 99%   General: No acute distress Head:  Normocephalic/atraumatic Skin/Extremities: No rash, no edema Neurological Exam: alert and awake. No aphasia or dysarthria. Fund of knowledge is appropriate.   Attention and concentration are normal.   Cranial nerves: Pupils equal, round. Extraocular movements intact with no nystagmus. Visual fields full.  No facial asymmetry.  Motor: Bulk and tone normal, muscle strength 5/5 throughout with no pronator drift.   Finger to nose testing intact.  Gait slow and cautious reporting left leg bothering him. No ataxia. No significant chorea today.    IMPRESSION: This is a 74 yo RH man with a history of OSA, atrial fibrillation on chronic anticoagulation, aortic valve replacement, depression, chronic fatigue syndrome/fibromyalgia, who had an episode of confusion last April 2020 and December 2020. MRI brain and EEG unremarkable, working diagnosis of complicated migraine. He is satisfied with migraine control on Aimovig  and Topiramate  25mg  at bedtime for prophylaxis, prn Rizatriptan  for rescue. They know to minimize rescue to 3 a week to avoid rebound headaches. Chorea has quieted down with no intervention. We discussed appetite issues, although Topiramate  can decrease appetite, he is on a very low dose and it has been quite helpful for the migraines. He has other GI issues as well likely contributing, continue follow-up with GI and PCP. Follow-up in 1 year, call for any changes.     Thank you for allowing me to participate in his care.  Please do not hesitate to call for any questions or concerns.   Darice Shivers, M.D.   CC: Dr. Arloa      [1]  Current Outpatient Medications on File Prior to Visit  Medication Sig Dispense Refill   acetaminophen  (TYLENOL ) 500 MG tablet Take 1,000 mg by mouth every 8 (eight) hours as needed for headache or moderate pain.     albuterol  (VENTOLIN  HFA) 108 (90 Base) MCG/ACT inhaler INHALE 2 PUFFS INTO THE LUNGS EVERY 6 HOURS AS NEEDED FOR WHEEZING OR SHORTNESS OF BREATH 18 g 12   ALPRAZolam  (XANAX ) 0.25 MG tablet Take 1 tablet (0.25 mg total) by mouth 3 (three) times daily as needed for anxiety. 15 tablet 0   aluminum  hydroxide-magnesium  carbonate (GAVISCON) 95-358 MG/15ML SUSP Take 15 mLs by mouth 3 (three) times daily as needed for indigestion or heartburn.     azelastine  (ASTELIN ) 137 MCG/SPRAY nasal spray Place 1 spray into the nose 2 (two) times daily as needed for rhinitis or allergies.     azithromycin  (ZITHROMAX ) 250 MG tablet 2 today then one daily (Patient not taking: Reported on 10/11/2024) 6 tablet 0   benzonatate  (TESSALON ) 200 MG capsule Take 1 capsule (200 mg total) by mouth 3 (three) times daily as needed for cough. 50 capsule 5   budesonide -formoterol  (SYMBICORT ) 160-4.5 MCG/ACT inhaler Inhale 2 puffs into the lungs 2 (two) times daily. in the morning and at bedtime. 10.2 g 12   cetirizine (ZYRTEC) 10 MG tablet Take 10 mg by mouth every morning.     clotrimazole (MYCELEX) 10 MG troche Take 10 mg by mouth 5 (five) times daily.     colchicine  (COLCRYS ) 0.6 MG tablet Take 0.6 mg by mouth daily.     dextromethorphan  (DELSYM ) 30 MG/5ML liquid Take 30 mg by mouth daily as needed for cough.     diclofenac  Sodium (VOLTAREN ) 1 % GEL Apply 2 g topically 4 (four)  times daily as needed (to affected areas- for arthritis pain.).     Erenumab -aooe (AIMOVIG ) 140 MG/ML SOAJ Inject 140 mg into the skin every 28 (twenty-eight) days. 1 mL 11   Ferrous Sulfate  (IRON) 142 (45 Fe) MG TBCR Take 142 mg by mouth in the morning and at bedtime.     finasteride  (PROSCAR ) 5 MG tablet Take 5 mg by mouth at bedtime.      furosemide  (LASIX ) 40 MG tablet TAKE 2 TABLETS(80 MG) BY MOUTH DAILY 180 tablet 3   Guaifenesin  (MUCINEX  MAXIMUM STRENGTH) 1200 MG TB12 Take 1,200 mg by mouth 2 (two) times daily as needed (cough).     hydrocortisone  (CORTEF ) 5 MG tablet 1-3 tabs daily for bronchitis 180 tablet 4   ipratropium (ATROVENT ) 0.06 % nasal spray Place 2 sprays into both nostrils 2 (two) times daily. 15 mL 12   levothyroxine  (SYNTHROID , LEVOTHROID) 112 MCG tablet Take 112 mcg by mouth daily before breakfast.      liothyronine   (CYTOMEL ) 25 MCG tablet Take 25 mcg by mouth daily before breakfast.     losartan  (COZAAR ) 50 MG tablet TAKE 1 TABLET BY MOUTH EVERY DAY 30 tablet 6   Menthol , Topical Analgesic, (BLUE-EMU MAXIMUM STRENGTH EX) Apply 1 application topically 3 (three) times daily as needed (arthritis pain.).     methocarbamol  (ROBAXIN ) 750 MG tablet Take 750 mg by mouth 3 (three) times daily.     metoprolol  tartrate (LOPRESSOR ) 50 MG tablet TAKE 1 TABLET(50 MG) BY MOUTH TWICE DAILY 180 tablet 1   olopatadine  (PATANOL) 0.1 % ophthalmic solution Place 1 drop into both eyes 2 (two) times daily as needed for allergies.     omeprazole  (PRILOSEC) 40 MG capsule Take 1 capsule (40 mg total) by mouth 2 (two) times daily. 180 capsule 1   polyethylene glycol (MIRALAX  / GLYCOLAX ) 17 g packet Take 17 g by mouth daily. 14 each 0   PRESCRIPTION MEDICATION See admin instructions. CPAP- At bedtime     rizatriptan  (MAXALT ) 10 MG tablet TAKE 1 TABLET BY MOUTH AS NEEDED FOR MIGRAINE. MAY REPEAT IN 2 HOURS IF NEEDED 30 tablet 3   rOPINIRole  (REQUIP ) 0.25 MG tablet Take 1 tablet (0.25 mg total) by mouth 3 (three) times daily. 90 tablet 12   saccharomyces boulardii (FLORASTOR) 250 MG capsule Take 250 mg by mouth every morning.     sodium chloride  (OCEAN) 0.65 % SOLN nasal spray Place 1 spray into both nostrils 2 (two) times daily.     Testosterone  Cypionate 200 MG/ML SOLN Inject 200 mg into the muscle See admin instructions. Inject 200 mg (1 ml) intramuscularly once every 7-10 days     topiramate  (TOPAMAX ) 25 MG tablet Take 1 tablet every night 30 tablet 11   venlafaxine  XR (EFFEXOR -XR) 75 MG 24 hr capsule TAKE 1 CAPSULE(75 MG) BY MOUTH DAILY WITH BREAKFAST 90 capsule 3   XARELTO  20 MG TABS tablet TAKE 1 TABLET(20 MG) BY MOUTH DAILY WITH SUPPER 90 tablet 3   zolpidem  (AMBIEN ) 10 MG tablet Take 10 mg by mouth at bedtime as needed for sleep.     Current Facility-Administered Medications on File Prior to Visit  Medication Dose Route  Frequency Provider Last Rate Last Admin   methylPREDNISolone  acetate (DEPO-MEDROL ) injection (RADIOLOGY ONLY) 120 mg  120 mg Intramuscular Once Meier, Nathaniel M, MD      [2]  Allergies Allergen Reactions   Ingrezza  [Valbenazine  Tosylate] Other (See Comments)    PVC's   Perphenazine Swelling and Other (See Comments)  Tongue swelling   Amantadines     Feels bad   Amiodarone  Swelling and Other (See Comments)    Tongue swelling   Codeine Itching   Levaquin [Levofloxacin] Other (See Comments)    malaise   Prednisone Itching and Other (See Comments)    CAN TOLERATE DEPO. PREDNISONE BY MOUTH CAUSES MOOD CHANGES, INSOMNIA, AND ITCHING.    Tramadol  Other (See Comments)    Delusions/hallucinations from high doses    "

## 2024-10-24 ENCOUNTER — Ambulatory Visit: Admitting: Neurology

## 2024-10-24 ENCOUNTER — Encounter: Payer: Self-pay | Admitting: Neurology

## 2024-10-24 VITALS — BP 142/87 | HR 70 | Ht 71.0 in | Wt 230.0 lb

## 2024-10-24 DIAGNOSIS — G43109 Migraine with aura, not intractable, without status migrainosus: Secondary | ICD-10-CM

## 2024-10-24 DIAGNOSIS — G255 Other chorea: Secondary | ICD-10-CM | POA: Diagnosis not present

## 2024-10-24 MED ORDER — RIZATRIPTAN BENZOATE 10 MG PO TABS: ORAL_TABLET | ORAL | 3 refills | Status: AC

## 2024-10-24 MED ORDER — TOPIRAMATE 25 MG PO TABS: ORAL_TABLET | ORAL | 3 refills | Status: DC

## 2024-10-24 MED ORDER — AIMOVIG 140 MG/ML ~~LOC~~ SOAJ: 1.0000 | SUBCUTANEOUS | 11 refills | Status: AC

## 2024-10-24 NOTE — Patient Instructions (Addendum)
 It's good to see you. Continue all your medications. Follow-up with your GI doctor for the diarrhea and appetite. Follow-up in 1 year, call for any changes.

## 2024-10-27 ENCOUNTER — Other Ambulatory Visit: Payer: Self-pay

## 2024-10-27 DIAGNOSIS — Z8679 Personal history of other diseases of the circulatory system: Secondary | ICD-10-CM

## 2024-11-04 ENCOUNTER — Ambulatory Visit (HOSPITAL_COMMUNITY)
Admission: RE | Admit: 2024-11-04 | Discharge: 2024-11-04 | Disposition: A | Discharge status: Home / Self Care | Source: Ambulatory Visit

## 2024-11-04 DIAGNOSIS — Z952 Presence of prosthetic heart valve: Secondary | ICD-10-CM | POA: Diagnosis not present

## 2024-11-04 DIAGNOSIS — R918 Other nonspecific abnormal finding of lung field: Secondary | ICD-10-CM | POA: Insufficient documentation

## 2024-11-04 DIAGNOSIS — Z95828 Presence of other vascular implants and grafts: Secondary | ICD-10-CM | POA: Diagnosis not present

## 2024-11-04 DIAGNOSIS — Z9889 Other specified postprocedural states: Secondary | ICD-10-CM | POA: Insufficient documentation

## 2024-11-04 DIAGNOSIS — Z8679 Personal history of other diseases of the circulatory system: Secondary | ICD-10-CM | POA: Insufficient documentation

## 2024-11-04 DIAGNOSIS — I700 Atherosclerosis of aorta: Secondary | ICD-10-CM | POA: Diagnosis not present

## 2024-11-09 ENCOUNTER — Ambulatory Visit (HOSPITAL_COMMUNITY)

## 2024-11-18 ENCOUNTER — Telehealth: Payer: Self-pay | Admitting: Neurology

## 2024-11-18 MED ORDER — TOPIRAMATE 25 MG PO TABS: ORAL_TABLET | ORAL | 1 refills | Status: AC

## 2024-11-18 NOTE — Telephone Encounter (Signed)
 [name] [name] called in this afternoon. [name] stated that Pt fell on 11-07-24, he fell on black ice on his back. [name] stated that she do not know rather or not Pt fell on his head. Pt stated that Pt did go see a chiropractor . [name] stated that Pt is having migraines since the fall on 11-07-24. Please call. Thanks

## 2024-11-18 NOTE — Telephone Encounter (Signed)
 Pt wife called advised that He may have hit his head, we can increase Topiramate  25mg : take 2 tablets daily and monitor how he does as he continues to recover from the fall new RX was sent in,

## 2024-11-18 NOTE — Addendum Note (Signed)
 Addended by: TAFT MOATS on: 11/18/2024 04:05 PM   Modules accepted: Orders

## 2024-11-18 NOTE — Telephone Encounter (Signed)
 He may have hit his head, we can increase Topiramate  25mg : take 2 tablets daily and monitor how he does as he continues to recover from the fall. If they agree, pls send in updated Rx, thanks

## 2024-11-21 ENCOUNTER — Ambulatory Visit

## 2024-11-23 ENCOUNTER — Ambulatory Visit

## 2024-11-24 ENCOUNTER — Telehealth: Payer: Self-pay | Admitting: Neurology

## 2024-11-24 ENCOUNTER — Ambulatory Visit (INDEPENDENT_AMBULATORY_CARE_PROVIDER_SITE_OTHER)

## 2024-11-24 DIAGNOSIS — G43109 Migraine with aura, not intractable, without status migrainosus: Secondary | ICD-10-CM | POA: Diagnosis not present

## 2024-11-24 MED ORDER — METOCLOPRAMIDE HCL 5 MG/ML IJ SOLN
10.0000 mg | Freq: Once | INTRAMUSCULAR | Status: AC
  Administered 2024-11-24: 10 mg via INTRAMUSCULAR

## 2024-11-24 MED ORDER — DIPHENHYDRAMINE HCL 50 MG/ML IJ SOLN
50.0000 mg | Freq: Once | INTRAMUSCULAR | Status: AC
  Administered 2024-11-24: 25 mg via INTRAMUSCULAR

## 2024-11-24 MED ORDER — KETOROLAC TROMETHAMINE 60 MG/2ML IM SOLN
60.0000 mg | Freq: Once | INTRAMUSCULAR | Status: AC
  Administered 2024-11-24: 60 mg via INTRAMUSCULAR

## 2024-11-24 NOTE — Telephone Encounter (Signed)
 We can do a VV, but in the meantime, we can either do a migraine cocktail or a 1-week course of Prednisone to hopefully break this current headache

## 2024-11-24 NOTE — Telephone Encounter (Signed)
 Pt is going to come in today for a headache cocktail , and get schedule for a VV with Dr Georjean,

## 2024-11-24 NOTE — Telephone Encounter (Signed)
 Pt has fallen and has been in a fog and is still having migraines but not the lightning part would like sooner appt of assit with another Rx chgn

## 2024-11-28 ENCOUNTER — Telehealth: Payer: Self-pay | Admitting: Neurology

## 2024-11-30 ENCOUNTER — Ambulatory Visit

## 2025-01-02 ENCOUNTER — Encounter: Admitting: Pulmonary Disease

## 2025-10-24 ENCOUNTER — Ambulatory Visit: Payer: Self-pay | Admitting: Neurology
